# Patient Record
Sex: Female | Born: 1940 | Race: White | Hispanic: No | Marital: Married | Smoking: Never smoker
Health system: Southern US, Community
[De-identification: ages and names within clinical notes are randomized; demographics above are authoritative.]

## PROBLEM LIST (undated history)

## (undated) DIAGNOSIS — R7989 Other specified abnormal findings of blood chemistry: Secondary | ICD-10-CM

## (undated) DIAGNOSIS — I701 Atherosclerosis of renal artery: Secondary | ICD-10-CM

## (undated) DIAGNOSIS — C801 Malignant (primary) neoplasm, unspecified: Secondary | ICD-10-CM

## (undated) DIAGNOSIS — E2839 Other primary ovarian failure: Secondary | ICD-10-CM

## (undated) DIAGNOSIS — E785 Hyperlipidemia, unspecified: Secondary | ICD-10-CM

## (undated) DIAGNOSIS — I4891 Unspecified atrial fibrillation: Secondary | ICD-10-CM

## (undated) DIAGNOSIS — N3000 Acute cystitis without hematuria: Secondary | ICD-10-CM

## (undated) DIAGNOSIS — S335XXA Sprain of ligaments of lumbar spine, initial encounter: Secondary | ICD-10-CM

## (undated) DIAGNOSIS — J309 Allergic rhinitis, unspecified: Secondary | ICD-10-CM

## (undated) DIAGNOSIS — J40 Bronchitis, not specified as acute or chronic: Secondary | ICD-10-CM

## (undated) DIAGNOSIS — I5043 Acute on chronic combined systolic (congestive) and diastolic (congestive) heart failure: Secondary | ICD-10-CM

## (undated) DIAGNOSIS — K219 Gastro-esophageal reflux disease without esophagitis: Secondary | ICD-10-CM

## (undated) DIAGNOSIS — I10 Essential (primary) hypertension: Secondary | ICD-10-CM

## (undated) DIAGNOSIS — R778 Other specified abnormalities of plasma proteins: Secondary | ICD-10-CM

## (undated) DIAGNOSIS — H109 Unspecified conjunctivitis: Secondary | ICD-10-CM

## (undated) DIAGNOSIS — N390 Urinary tract infection, site not specified: Secondary | ICD-10-CM

## (undated) DIAGNOSIS — L02419 Cutaneous abscess of limb, unspecified: Secondary | ICD-10-CM

## (undated) DIAGNOSIS — IMO0002 Reserved for concepts with insufficient information to code with codable children: Secondary | ICD-10-CM

## (undated) DIAGNOSIS — B351 Tinea unguium: Secondary | ICD-10-CM

## (undated) DIAGNOSIS — L03119 Cellulitis of unspecified part of limb: Secondary | ICD-10-CM

## (undated) HISTORY — DX: Hyperlipidemia, unspecified: E78.5

## (undated) HISTORY — DX: Unspecified atrial fibrillation: I48.91

## (undated) HISTORY — PX: APPENDECTOMY: SHX54

## (undated) HISTORY — DX: Bronchitis, not specified as acute or chronic: J40

## (undated) HISTORY — DX: Allergic rhinitis, unspecified: J30.9

## (undated) HISTORY — DX: Malignant (primary) neoplasm, unspecified: C80.1

## (undated) HISTORY — PX: KNEE ARTHROSCOPY: SUR90

## (undated) HISTORY — PX: TONSILLECTOMY: SUR1361

## (undated) HISTORY — PX: DIAGNOSTIC MAMMOGRAM: HXRAD719

## (undated) HISTORY — DX: Other primary ovarian failure: E28.39

## (undated) HISTORY — DX: Other specified abnormal findings of blood chemistry: R79.89

## (undated) HISTORY — PX: CARDIAC CATHETERIZATION: SHX172

## (undated) HISTORY — DX: Sprain of ligaments of lumbar spine, initial encounter: S33.5XXA

## (undated) HISTORY — DX: Other specified abnormalities of plasma proteins: R77.8

## (undated) HISTORY — DX: Gastro-esophageal reflux disease without esophagitis: K21.9

## (undated) HISTORY — DX: Cellulitis of unspecified part of limb: L03.119

## (undated) HISTORY — DX: Atherosclerosis of renal artery: I70.1

## (undated) HISTORY — PX: EYE SURGERY: SHX253

## (undated) HISTORY — PX: COLONOSCOPY: SHX174

## (undated) HISTORY — DX: Acute cystitis without hematuria: N30.00

## (undated) HISTORY — DX: Essential (primary) hypertension: I10

## (undated) HISTORY — DX: Urinary tract infection, site not specified: N39.0

## (undated) HISTORY — DX: Cellulitis of unspecified part of limb: L02.419

## (undated) HISTORY — DX: Reserved for concepts with insufficient information to code with codable children: IMO0002

## (undated) HISTORY — DX: Unspecified conjunctivitis: H10.9

## (undated) HISTORY — DX: Tinea unguium: B35.1

## (undated) HISTORY — PX: OTHER SURGICAL HISTORY: SHX169

---

## 2001-07-02 DIAGNOSIS — H269 Unspecified cataract: Secondary | ICD-10-CM

## 2001-07-02 HISTORY — DX: Unspecified cataract: H26.9

## 2004-05-18 ENCOUNTER — Ambulatory Visit: Payer: Self-pay | Admitting: General Practice

## 2004-08-03 ENCOUNTER — Other Ambulatory Visit: Payer: Self-pay

## 2004-08-11 ENCOUNTER — Ambulatory Visit: Payer: Self-pay | Admitting: General Practice

## 2004-10-25 ENCOUNTER — Ambulatory Visit: Payer: Self-pay

## 2004-10-30 ENCOUNTER — Encounter: Payer: Self-pay | Admitting: Cardiovascular Disease

## 2005-01-23 ENCOUNTER — Ambulatory Visit: Payer: Self-pay

## 2005-10-15 ENCOUNTER — Inpatient Hospital Stay: Payer: Self-pay

## 2005-10-15 ENCOUNTER — Other Ambulatory Visit: Payer: Self-pay

## 2005-10-16 ENCOUNTER — Encounter: Payer: Self-pay | Admitting: Cardiovascular Disease

## 2006-02-13 ENCOUNTER — Ambulatory Visit: Payer: Self-pay | Admitting: Family Medicine

## 2006-11-14 ENCOUNTER — Ambulatory Visit: Payer: Self-pay | Admitting: General Surgery

## 2006-11-28 ENCOUNTER — Ambulatory Visit: Payer: Self-pay | Admitting: General Surgery

## 2006-11-28 LAB — HM COLONOSCOPY: HM COLON: NORMAL

## 2006-12-10 ENCOUNTER — Ambulatory Visit: Payer: Self-pay | Admitting: Family Medicine

## 2007-01-17 DIAGNOSIS — M5417 Radiculopathy, lumbosacral region: Secondary | ICD-10-CM | POA: Insufficient documentation

## 2007-01-21 ENCOUNTER — Ambulatory Visit: Payer: Self-pay | Admitting: Pain Medicine

## 2007-01-29 ENCOUNTER — Ambulatory Visit: Payer: Self-pay | Admitting: Pain Medicine

## 2007-03-06 ENCOUNTER — Ambulatory Visit: Payer: Self-pay | Admitting: Pain Medicine

## 2007-04-11 ENCOUNTER — Ambulatory Visit: Payer: Self-pay | Admitting: Family Medicine

## 2007-04-16 ENCOUNTER — Ambulatory Visit: Payer: Self-pay | Admitting: Pain Medicine

## 2007-06-05 ENCOUNTER — Ambulatory Visit: Payer: Self-pay | Admitting: Pain Medicine

## 2007-06-09 ENCOUNTER — Ambulatory Visit: Payer: Self-pay | Admitting: Pain Medicine

## 2007-07-17 ENCOUNTER — Ambulatory Visit: Payer: Self-pay | Admitting: Pain Medicine

## 2007-08-18 ENCOUNTER — Ambulatory Visit: Payer: Self-pay | Admitting: Pain Medicine

## 2007-09-01 ENCOUNTER — Ambulatory Visit: Payer: Self-pay | Admitting: Pain Medicine

## 2007-09-18 ENCOUNTER — Ambulatory Visit: Payer: Self-pay | Admitting: Pain Medicine

## 2007-10-08 ENCOUNTER — Ambulatory Visit: Payer: Self-pay | Admitting: Pain Medicine

## 2007-11-06 ENCOUNTER — Ambulatory Visit: Payer: Self-pay | Admitting: Pain Medicine

## 2007-11-12 ENCOUNTER — Ambulatory Visit: Payer: Self-pay | Admitting: Pain Medicine

## 2007-11-27 DIAGNOSIS — J3089 Other allergic rhinitis: Secondary | ICD-10-CM | POA: Insufficient documentation

## 2007-12-30 ENCOUNTER — Ambulatory Visit: Payer: Self-pay | Admitting: Pain Medicine

## 2008-02-26 ENCOUNTER — Ambulatory Visit: Payer: Self-pay | Admitting: Pain Medicine

## 2008-03-29 ENCOUNTER — Ambulatory Visit: Payer: Self-pay | Admitting: Pain Medicine

## 2008-04-13 ENCOUNTER — Ambulatory Visit: Payer: Self-pay | Admitting: Family Medicine

## 2008-06-03 ENCOUNTER — Ambulatory Visit: Payer: Self-pay | Admitting: Pain Medicine

## 2008-08-05 ENCOUNTER — Ambulatory Visit: Payer: Self-pay | Admitting: Pain Medicine

## 2008-09-08 ENCOUNTER — Ambulatory Visit: Payer: Self-pay | Admitting: Ophthalmology

## 2008-09-20 ENCOUNTER — Ambulatory Visit: Payer: Self-pay | Admitting: Ophthalmology

## 2008-11-04 ENCOUNTER — Ambulatory Visit: Payer: Self-pay | Admitting: Pain Medicine

## 2008-11-11 ENCOUNTER — Ambulatory Visit: Payer: Self-pay | Admitting: Ophthalmology

## 2008-11-22 ENCOUNTER — Ambulatory Visit: Payer: Self-pay | Admitting: Ophthalmology

## 2009-01-19 ENCOUNTER — Ambulatory Visit: Payer: Self-pay | Admitting: Pain Medicine

## 2009-02-16 ENCOUNTER — Ambulatory Visit: Payer: Self-pay | Admitting: Pain Medicine

## 2009-02-21 ENCOUNTER — Ambulatory Visit: Payer: Self-pay | Admitting: Pain Medicine

## 2009-03-10 ENCOUNTER — Ambulatory Visit: Payer: Self-pay | Admitting: Pain Medicine

## 2009-03-14 ENCOUNTER — Ambulatory Visit: Payer: Self-pay | Admitting: Pain Medicine

## 2009-04-15 ENCOUNTER — Ambulatory Visit: Payer: Self-pay | Admitting: Family Medicine

## 2009-09-30 ENCOUNTER — Encounter: Payer: Self-pay | Admitting: Cardiovascular Disease

## 2009-10-11 ENCOUNTER — Encounter: Payer: Self-pay | Admitting: Cardiovascular Disease

## 2009-11-07 ENCOUNTER — Ambulatory Visit: Payer: Self-pay | Admitting: Cardiovascular Disease

## 2009-11-07 DIAGNOSIS — I701 Atherosclerosis of renal artery: Secondary | ICD-10-CM | POA: Insufficient documentation

## 2009-11-07 DIAGNOSIS — I10 Essential (primary) hypertension: Secondary | ICD-10-CM | POA: Insufficient documentation

## 2009-11-07 DIAGNOSIS — E1169 Type 2 diabetes mellitus with other specified complication: Secondary | ICD-10-CM | POA: Insufficient documentation

## 2009-11-07 DIAGNOSIS — E785 Hyperlipidemia, unspecified: Secondary | ICD-10-CM | POA: Insufficient documentation

## 2009-12-13 ENCOUNTER — Encounter: Payer: Self-pay | Admitting: Cardiovascular Disease

## 2010-04-24 ENCOUNTER — Encounter: Payer: Self-pay | Admitting: Cardiovascular Disease

## 2010-08-01 NOTE — Letter (Signed)
Summary: Medical Record Release  Medical Record Release   Imported By: Harlon Flor 11/07/2009 14:48:12  _____________________________________________________________________  External Attachment:    Type:   Image     Comment:   External Document

## 2010-08-01 NOTE — Cardiovascular Report (Signed)
Summary: Cardiac Cath Other  Cardiac Cath Other   Imported By: Frazier Butt Chriscoe 12/13/2009 10:46:04  _____________________________________________________________________  External Attachment:    Type:   Image     Comment:   External Document

## 2010-08-01 NOTE — Assessment & Plan Note (Signed)
Summary: NEW PT   Primary Provider:  Irven Easterly, MD  CC:  Consult;No complaints.  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  Overall she states that she has been doing well. She has been trying to lose weight and states that she has dropped 30-40 pounds over the last several years. She try to watch her diet. Her hemoglobin A1c is typically less than 7. She is uncertain what her cholesterol is. no significant chest pain, lightheadedness. She does have some diarrhea which she attributes to her diabetes medications.  She also has a history of sciatica and a meniscus tear on the left.  She had a echocardiogram in May 2006 which was essentially normal with ejection fraction 60%.   Stress test in 06, 03 and 01 had been normal.  Current Medications (verified): 1)  Lantus 100 Unit/ml Soln (Insulin Glargine) .... As Directed 2)  Fortamet 500 Mg Xr24h-Tab (Metformin Hcl) .... Take 2 By Mouth Two Times A Day 3)  Victoza 18 Mg/83ml Soln (Liraglutide) .... As Directed 4)  Omeprazole 20 Mg Cpdr (Omeprazole) .... Take 1 By Mouth in The Evening 5)  Avalide 150-12.5 Mg Tabs (Irbesartan-Hydrochlorothiazide) .... Take 1 By Mouth Once Daily 6)  Evista 60 Mg Tabs (Raloxifene Hcl) .... Take 1 By Mouth Once Daily 7)  Crestor 10 Mg Tabs (Rosuvastatin Calcium) .... Take One Tablet By Mouth Daily. 8)  Aspirin 81 Mg Tbec (Aspirin) .... Take One Tablet By Mouth Daily 9)  Daily Multiple Vitamins  Tabs (Multiple Vitamin) .... Once Daily 10)  Lamisil 250 Mg Tabs (Terbinafine Hcl) .... Take 1 By Mouth For 3 Months  Allergies (verified): No Known Drug Allergies  Review of Systems  The patient denies fever, weight loss, weight gain, vision loss, decreased  hearing, hoarseness, chest pain, syncope, dyspnea on exertion, peripheral edema, prolonged cough, abdominal pain, incontinence, muscle weakness, depression, and enlarged lymph nodes.    Vital Signs:  Patient profile:   70 year old female Height:      64 inches Weight:      191 pounds BMI:     32.90 Pulse rate:   68 / minute BP sitting:   130 / 78  (right arm) Cuff size:   large  Vitals Entered By: Bonnie Rowland, EMT-P (Nov 07, 2009 2:45 PM)  Physical Exam  General:  well-appearing woman in no apparent distress, HEENT exam is benign, oropharynx is clear, neck is supple with no JVP or carotid bruits, heart sounds are regular with S1-S2 and no murmurs appreciated, lungs are clear to auscultation with no wheezes rales, abdominal exam is benign, no significant lower extremity edema, neurologic exam is nonfocal skin is warm and dry. Pulses are equal and symmetrical in her upper and lower extremities.    EKG  Procedure date:  11/07/2009  Findings:      normal sinus rhythm with rate of 68 beats per minute, rare PVC, no significant ST or T wave change.  Impression & Recommendations:  Problem # 1:  RENAL ATHEROSCLEROSIS (ICD-440.1) history of 50% bilateral renal artery stenosis in 2007 by cardiac catheter. We have suggested that she have a repeat ultrasound of her renal arteries at her convenience as it has been for years.  She is on aspirin and a statin.  Problem # 2:  HYPERLIPIDEMIA-MIXED (ICD-272.4) we'll try to obtain her most recent cholesterol panel from her primary care physician  Her updated medication list for this problem includes:    Crestor 10 Mg Tabs (Rosuvastatin calcium) .Marland Kitchen... Take one tablet by mouth daily.  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 She states her hemoglobin A1c is typically less than 7, close to 6.5. She is losing weight and has dropped more than 20 pounds over the past several  years.  Her updated medication list for this problem includes:    Lantus 100 Unit/ml Soln (Insulin glargine) .Marland Kitchen... As directed    Fortamet 500 Mg Xr24h-tab (Metformin hcl) .Marland Kitchen... Take 2 by mouth two times a day    Victoza 18 Mg/29ml Soln (Liraglutide) .Marland Kitchen... As directed    Avalide 150-12.5 Mg Tabs (Irbesartan-hydrochlorothiazide) .Marland Kitchen... Take 1 by mouth once daily    Aspirin 81 Mg Tbec (Aspirin) .Marland Kitchen... Take one tablet by mouth daily  Problem # 4:  HYPERTENSION, BENIGN (ICD-401.1) Blood pressure is well controlled on today's visit. She states she has been given a new prescription for losartan HCT 100/12.5 mg daily. Have asked her to monitor her blood pressure on any medication. Her updated medication list for this problem includes:    Avalide 150-12.5 Mg Tabs (Irbesartan-hydrochlorothiazide) .Marland Kitchen... Take 1 by mouth once daily    Aspirin 81 Mg Tbec (Aspirin) .Marland Kitchen... Take one tablet by mouth daily  Patient Instructions: 1)  Your physician recommends that you schedule a follow-up appointment in: 1 year.  Appended Document: NEW PT Labs from 4/11 Chol 117 LDL 39 HDL 69 TG 44  Labs are very very good with recent weight loss. Would cut crestor 10 mg in 1/2 (take 5 mg)  Appended Document: NEW PT Attempted to notify pt of results.  LMOM TCB.  EWJ  Appended Document: NEW PT Called spoke with pt advised of results.  EWJ   Clinical Lists Changes  Medications: Changed medication from CRESTOR 10 MG TABS (ROSUVASTATIN CALCIUM) Take one tablet by mouth daily. to CRESTOR 10 MG TABS (ROSUVASTATIN CALCIUM) Take 1/2 tablet by mouth daily.

## 2010-08-01 NOTE — Letter (Signed)
Summary: PHI  PHI   Imported By: Harlon Flor 11/09/2009 14:22:40  _____________________________________________________________________  External Attachment:    Type:   Image     Comment:   External Document

## 2010-08-24 ENCOUNTER — Ambulatory Visit: Payer: Self-pay | Admitting: Family Medicine

## 2010-09-29 ENCOUNTER — Encounter: Payer: Self-pay | Admitting: Cardiovascular Disease

## 2010-11-06 ENCOUNTER — Encounter: Payer: Self-pay | Admitting: Cardiovascular Disease

## 2010-11-06 ENCOUNTER — Ambulatory Visit (INDEPENDENT_AMBULATORY_CARE_PROVIDER_SITE_OTHER): Payer: BC Managed Care – PPO | Admitting: Cardiovascular Disease

## 2010-11-06 DIAGNOSIS — E785 Hyperlipidemia, unspecified: Secondary | ICD-10-CM

## 2010-11-06 DIAGNOSIS — I10 Essential (primary) hypertension: Secondary | ICD-10-CM

## 2010-11-06 DIAGNOSIS — I701 Atherosclerosis of renal artery: Secondary | ICD-10-CM

## 2010-11-06 DIAGNOSIS — R079 Chest pain, unspecified: Secondary | ICD-10-CM | POA: Insufficient documentation

## 2010-11-06 DIAGNOSIS — E119 Type 2 diabetes mellitus without complications: Secondary | ICD-10-CM

## 2010-11-06 DIAGNOSIS — I25810 Atherosclerosis of coronary artery bypass graft(s) without angina pectoris: Secondary | ICD-10-CM

## 2010-11-06 NOTE — Assessment & Plan Note (Signed)
Chest pain is atypical in that it comes on at rest. She is able to exert herself and exercise without significant symptoms. We have suggested she monitor her symptoms for now with no cardiac workup

## 2010-11-06 NOTE — Progress Notes (Signed)
   Patient ID: Bonnie Rowland, female    DOB: 1941-06-14, 70 y.o.   MRN: [id number]  HPI Comments: Ms. 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  She reports that she has had some episodes of chest pain recently. They come on at rest, seemed to come after she eats. They come and go as they please. They're not associated with exertion or exercise. She has been doing some upper body weights and leg weights. Her other exercises do not cause any significant chest discomfort. She is taking a proton pump inhibitor every other day. She tried Actos but did not like the way it made her feel.   Her weight is up because she has been eating "shock" She also has a history of sciatica and a meniscus tear on the left. She had a echocardiogram in May 2006 which was essentially normal with ejection fraction 60%.     Stress test in 06, 03 and 01 had been normal.  EKG done by Dr. Charlette Caffey in his clinic shows normal sinus rhythm with nonspecific ST abnormality in the anterolateral leads as well as leads 2, aVF. This was seen previously in EKG from last year      Review of Systems  Constitutional: Negative.   HENT: Negative.   Eyes: Negative.   Respiratory: Negative.   Cardiovascular: Positive for chest pain.  Gastrointestinal: Negative.   Musculoskeletal: Negative.   Skin: Negative.   Neurological: Negative.   Hematological: Negative.   Psychiatric/Behavioral: Negative.   All other systems reviewed and are negative.    BP 138/74  Pulse 64  Ht 5\' 4"  (1.626 m)  Wt 197 lb 6.4 oz (89.54 kg)  BMI 33.88 kg/m2  Physical Exam  Nursing note and vitals reviewed. Constitutional: She is oriented to person, place, and time. She appears well-developed and well-nourished.  HENT:    Head: Normocephalic.  Nose: Nose normal.  Mouth/Throat: Oropharynx is clear and moist.  Eyes: Conjunctivae are normal. Pupils are equal, round, and reactive to light.  Neck: Normal range of motion. Neck supple. No JVD present.  Cardiovascular: Normal rate, regular rhythm, S1 normal, S2 normal, normal heart sounds and intact distal pulses.  Exam reveals no gallop and no friction rub.   No murmur heard. Pulmonary/Chest: Effort normal and breath sounds normal. No respiratory distress. She has no wheezes. She has no rales. She exhibits no tenderness.  Abdominal: Soft. Bowel sounds are normal. She exhibits no distension. There is no tenderness.  Musculoskeletal: Normal range of motion. She exhibits no edema and no tenderness.  Lymphadenopathy:    She has no cervical adenopathy.  Neurological: She is alert and oriented to person, place, and time. Coordination normal.  Skin: Skin is warm and dry. No rash noted. No erythema.  Psychiatric: She has a normal mood and affect. Her behavior is normal. Judgment and thought content normal.         Assessment and Plan

## 2010-11-06 NOTE — Assessment & Plan Note (Signed)
In the past, she has had excellent cholesterol numbers. She is taking Crestor every other day. We will wait for her new numbers later this month.

## 2010-11-06 NOTE — Patient Instructions (Signed)
You are doing well. No medication changes were made. Please call us if you have new issues that need to be addressed before your next appt.  We will call you for a follow up Appt. In 12 months  

## 2010-11-06 NOTE — Assessment & Plan Note (Signed)
Continue aggressive cholesterol management for her underlying renal disease.

## 2010-11-06 NOTE — Assessment & Plan Note (Signed)
We have encouraged continued exercise, careful diet management in an effort to lose weight. 

## 2010-11-06 NOTE — Assessment & Plan Note (Signed)
Blood pressure is well controlled on today's visit. No changes made to the medications. 

## 2011-05-14 ENCOUNTER — Encounter: Payer: Self-pay | Admitting: Cardiovascular Disease

## 2011-05-16 ENCOUNTER — Encounter: Payer: Self-pay | Admitting: Cardiovascular Disease

## 2011-05-16 ENCOUNTER — Ambulatory Visit (INDEPENDENT_AMBULATORY_CARE_PROVIDER_SITE_OTHER): Payer: BC Managed Care – PPO | Admitting: Cardiovascular Disease

## 2011-05-16 VITALS — BP 110/80 | HR 62 | Resp 16 | Ht 64.0 in | Wt 200.0 lb

## 2011-05-16 DIAGNOSIS — I10 Essential (primary) hypertension: Secondary | ICD-10-CM

## 2011-05-16 DIAGNOSIS — E785 Hyperlipidemia, unspecified: Secondary | ICD-10-CM

## 2011-05-16 DIAGNOSIS — I701 Atherosclerosis of renal artery: Secondary | ICD-10-CM

## 2011-05-16 DIAGNOSIS — E119 Type 2 diabetes mellitus without complications: Secondary | ICD-10-CM

## 2011-05-16 NOTE — Assessment & Plan Note (Signed)
We will try to obtain her latest renal ultrasound for our records. Cholesterol management is excellent, blood pressure control is very good as well suggesting no significant progression of her renal artery stenosis.

## 2011-05-16 NOTE — Assessment & Plan Note (Signed)
We have encouraged continued exercise, careful diet management in an effort to lose weight. 

## 2011-05-16 NOTE — Assessment & Plan Note (Signed)
Blood pressure is well controlled, in fact it is borderline low. No dizziness. We have suggested that she could try to cut her losartan and half and closely monitor her blood pressure. As a diabetic, we try to keep her systolic pressure less than 135, preferably less than 130.

## 2011-05-16 NOTE — Assessment & Plan Note (Signed)
Cholesterol is at goal on the current lipid regimen. No changes to the medications were made.  

## 2011-05-16 NOTE — Progress Notes (Signed)
Patient ID: Bonnie Rowland, female    DOB: 04-02-1941, 70 y.o.   MRN: [id number]  HPI Comments: Bonnie Rowland is a very pleasant 70 year old woman with a history of normal coronary arteries by cardiac catheterization April 2007 which was performed for chest pain, 50% bilateral renal artery disease, hyperlipidemia, diabetes, hypertension who presents for routine followup.  She has had previous episodes of chest pain though reports having no symptoms at this time. She exercises 3 times per week at the gym using the bike, and weights. Her on weight has increased and she is trying to watch what she eats. She reports that her cholesterol is good and her hemoglobin A1c was 6.1.   She also has a history of sciatica and a meniscus tear on the left. She had a echocardiogram in May 2006 which was essentially normal with ejection fraction 60%.     Stress test in 06, 03 and 01 had been normal.  EKG shows normal sinus rhythm with rate 62 beats a minute with nonspecific ST abnormality in leads V3 through V6    Outpatient Encounter Prescriptions as of 05/16/2011  Medication Sig Dispense Refill  . aspirin 81 MG EC tablet Take 81 mg by mouth daily.        . B Complex-C (B-COMPLEX WITH VITAMIN C) tablet Take 1 tablet by mouth daily.        . calcium-vitamin D (OSCAL WITH D) 500-200 MG-UNIT per tablet Take 1 tablet by mouth daily.        . Coenzyme Q10 (COQ-10) 100 MG CAPS Take 1 capsule by mouth at bedtime.        . Cranberry 1000 MG CAPS Take 1 capsule by mouth every other day.        . fish oil-omega-3 fatty acids 1000 MG capsule Take 2 g by mouth daily.        Marland Kitchen GARLIC 1500 PO Take 1 tablet by mouth daily.        . Glucosamine 500 MG TABS Take 1 tablet by mouth daily.        . insulin glargine (LANTUS) 100 UNIT/ML injection Inject into the skin as directed.        . Liraglutide (VICTOZA) 18 MG/3ML SOLN Inject into the skin as directed.        Marland Kitchen losartan (COZAAR) 100 MG tablet Take 100 mg by mouth  daily.        . metformin (FORTAMET) 500 MG (OSM) 24 hr tablet Take 1,000 mg by mouth 2 (two) times daily with a meal.       . Multiple Vitamin (MULTIVITAMIN) tablet Take 1 tablet by mouth daily.        Marland Kitchen omeprazole (PRILOSEC) 20 MG capsule Take 20 mg by mouth every other day.       . raloxifene (EVISTA) 60 MG tablet Take 60 mg by mouth daily.        . rosuvastatin (CRESTOR) 10 MG tablet Take 10 mg by mouth every other day.       . terbinafine (LAMISIL) 250 MG tablet Take 250 mg by mouth daily.           Review of Systems  Constitutional: Negative.   HENT: Negative.   Eyes: Negative.   Respiratory: Negative.   Gastrointestinal: Negative.   Musculoskeletal: Negative.   Skin: Negative.   Neurological: Negative.   Hematological: Negative.   Psychiatric/Behavioral: Negative.   All other systems reviewed and are negative.    BP 110/80  Pulse 62  Resp 16  Ht 5\' 4"  (1.626 m)  Wt 200 lb (90.719 kg)  BMI 34.33 kg/m2  Physical Exam  Nursing note and vitals reviewed. Constitutional: She is oriented to person, place, and time. She appears well-developed and well-nourished.       obesity  HENT:  Head: Normocephalic.  Nose: Nose normal.  Mouth/Throat: Oropharynx is clear and moist.  Eyes: Conjunctivae are normal. Pupils are equal, round, and reactive to light.  Neck: Normal range of motion. Neck supple. No JVD present.  Cardiovascular: Normal rate, regular rhythm, S1 normal, S2 normal, normal heart sounds and intact distal pulses.  Exam reveals no gallop and no friction rub.   No murmur heard. Pulmonary/Chest: Effort normal and breath sounds normal. No respiratory distress. She has no wheezes. She has no rales. She exhibits no tenderness.  Abdominal: Soft. Bowel sounds are normal. She exhibits no distension. There is no tenderness.  Musculoskeletal: Normal range of motion. She exhibits no edema and no tenderness.  Lymphadenopathy:    She has no cervical adenopathy.  Neurological:  She is alert and oriented to person, place, and time. Coordination normal.  Skin: Skin is warm and dry. No rash noted. No erythema.  Psychiatric: She has a normal mood and affect. Her behavior is normal. Judgment and thought content normal.         Assessment and Plan

## 2011-05-16 NOTE — Patient Instructions (Signed)
You are doing well. No medication changes were made. If you would like, cut the losartan in 1/2 and monitor your blood pressure  Please call us if you have new issues that need to be addressed before your next appt.  The office will contact you for a follow up Appt. In 12 months

## 2011-06-14 ENCOUNTER — Other Ambulatory Visit: Payer: Self-pay

## 2011-06-14 DIAGNOSIS — I701 Atherosclerosis of renal artery: Secondary | ICD-10-CM

## 2011-06-29 ENCOUNTER — Encounter (INDEPENDENT_AMBULATORY_CARE_PROVIDER_SITE_OTHER): Payer: BC Managed Care – PPO | Admitting: *Deleted

## 2011-06-29 DIAGNOSIS — I701 Atherosclerosis of renal artery: Secondary | ICD-10-CM

## 2011-07-10 NOTE — Progress Notes (Signed)
LMOM to call back regarding results.

## 2011-07-18 ENCOUNTER — Telehealth: Payer: Self-pay

## 2011-07-20 NOTE — Telephone Encounter (Signed)
Patient notified of test results 

## 2011-09-25 ENCOUNTER — Ambulatory Visit: Payer: Self-pay | Admitting: Family Medicine

## 2011-12-18 LAB — HM COLONOSCOPY: HM Colonoscopy: NORMAL

## 2012-05-08 ENCOUNTER — Encounter: Payer: Self-pay | Admitting: *Deleted

## 2012-05-15 ENCOUNTER — Ambulatory Visit (INDEPENDENT_AMBULATORY_CARE_PROVIDER_SITE_OTHER): Payer: BC Managed Care – PPO | Admitting: Cardiovascular Disease

## 2012-05-15 ENCOUNTER — Encounter: Payer: Self-pay | Admitting: Cardiovascular Disease

## 2012-05-15 VITALS — BP 138/68 | HR 68 | Ht 64.0 in | Wt 194.5 lb

## 2012-05-15 DIAGNOSIS — I701 Atherosclerosis of renal artery: Secondary | ICD-10-CM

## 2012-05-15 DIAGNOSIS — E119 Type 2 diabetes mellitus without complications: Secondary | ICD-10-CM

## 2012-05-15 DIAGNOSIS — I10 Essential (primary) hypertension: Secondary | ICD-10-CM

## 2012-05-15 DIAGNOSIS — E785 Hyperlipidemia, unspecified: Secondary | ICD-10-CM

## 2012-05-15 NOTE — Assessment & Plan Note (Signed)
Cholesterol is at goal on the current lipid regimen. No changes to the medications were made.  

## 2012-05-15 NOTE — Assessment & Plan Note (Signed)
Continue aggressive lipid management, goal LDL less than 70 

## 2012-05-15 NOTE — Patient Instructions (Addendum)
You are doing well. No medication changes were made.  Please call us if you have new issues that need to be addressed before your next appt.  Your physician wants you to follow-up in: 12 months.  You will receive a reminder letter in the mail two months in advance. If you don't receive a letter, please call our office to schedule the follow-up appointment. 

## 2012-05-15 NOTE — Assessment & Plan Note (Signed)
Blood pressure is well controlled on today's visit. No changes made to the medications. 

## 2012-05-15 NOTE — Progress Notes (Signed)
Patient ID: Bonnie Rowland, female    DOB: 1940-08-16, 71 y.o.   MRN: [id number]  HPI Comments: Bonnie Rowland is a very pleasant 71 year old woman with a history of normal coronary arteries by cardiac catheterization April 2007 which was performed for chest pain, < 50%  renal artery disease,  hyperlipidemia, diabetes, hypertension, obesity who presents for routine followup.  She had a difficult year as her father passed away several months ago. She was eating poorly and diabetes numbers were elevated. Weight also increased. She has now restarted her  exercises 3 times per week at the gym using the bike, and weights.  she is trying to watch what she eats.  Total cholesterol 120, LDL in the 50s Hemoglobin W2N 6.9  She also has a history of sciatica and a meniscus tear on the left. She had a echocardiogram in May 2006 which was essentially normal with ejection fraction 60%.     Stress test in 06, 03 and 01 had been normal.  EKG shows normal sinus rhythm with nonspecific ST abnormality in leads V3 through V6    Outpatient Encounter Prescriptions as of 05/15/2012  Medication Sig Dispense Refill  . aspirin 81 MG EC tablet Take 81 mg by mouth daily.        . B Complex-C (B-COMPLEX WITH VITAMIN C) tablet Take 1 tablet by mouth daily.        . cholecalciferol (VITAMIN D) 1000 UNITS tablet Take 1,000 Units by mouth 2 (two) times daily.      . Coenzyme Q10 (COQ-10) 100 MG CAPS Take 1 capsule by mouth at bedtime.        . Cranberry 1000 MG CAPS Take 1 capsule by mouth every other day.        . fish oil-omega-3 fatty acids 1000 MG capsule Take 2 g by mouth daily.        Marland Kitchen GARLIC 1500 PO Take 1 tablet by mouth daily.        . Glucosamine 500 MG TABS Take 1 tablet by mouth daily.        . insulin glargine (LANTUS) 100 UNIT/ML injection Inject 24 Units into the skin at bedtime.       . Liraglutide (VICTOZA) 18 MG/3ML SOLN Inject into the skin as directed.        Marland Kitchen losartan (COZAAR) 100 MG tablet Take  100 mg by mouth daily.        . metformin (FORTAMET) 500 MG (OSM) 24 hr tablet Take 500 mg by mouth 2 (two) times daily with a meal.       . Multiple Vitamin (MULTIVITAMIN) tablet Take 1 tablet by mouth daily.       Marland Kitchen omeprazole (PRILOSEC) 20 MG capsule Take 20 mg by mouth every other day.       . raloxifene (EVISTA) 60 MG tablet Take 60 mg by mouth daily.        . rosuvastatin (CRESTOR) 10 MG tablet Take 10 mg by mouth every other day.       . terbinafine (LAMISIL) 250 MG tablet Take 250 mg by mouth daily.        . [DISCONTINUED] calcium-vitamin D (OSCAL WITH D) 500-200 MG-UNIT per tablet Take 1 tablet by mouth daily.           Review of Systems  Constitutional: Negative.   HENT: Negative.   Eyes: Negative.   Respiratory: Negative.   Gastrointestinal: Negative.   Musculoskeletal: Negative.   Skin: Negative.  Neurological: Negative.   Hematological: Negative.   Psychiatric/Behavioral: Negative.   All other systems reviewed and are negative.    BP 138/68  Pulse 68  Ht 5\' 4"  (1.626 m)  Wt 194 lb 8 oz (88.225 kg)  BMI 33.39 kg/m2  Physical Exam  Nursing note and vitals reviewed. Constitutional: She is oriented to person, place, and time. She appears well-developed and well-nourished.       obesity  HENT:  Head: Normocephalic.  Nose: Nose normal.  Mouth/Throat: Oropharynx is clear and moist.  Eyes: Conjunctivae normal are normal. Pupils are equal, round, and reactive to light.  Neck: Normal range of motion. Neck supple. No JVD present.  Cardiovascular: Normal rate, regular rhythm, S1 normal, S2 normal, normal heart sounds and intact distal pulses.  Exam reveals no gallop and no friction rub.   No murmur heard. Pulmonary/Chest: Effort normal and breath sounds normal. No respiratory distress. She has no wheezes. She has no rales. She exhibits no tenderness.  Abdominal: Soft. Bowel sounds are normal. She exhibits no distension. There is no tenderness.  Musculoskeletal: Normal  range of motion. She exhibits no edema and no tenderness.  Lymphadenopathy:    She has no cervical adenopathy.  Neurological: She is alert and oriented to person, place, and time. Coordination normal.  Skin: Skin is warm and dry. No rash noted. No erythema.  Psychiatric: She has a normal mood and affect. Her behavior is normal. Judgment and thought content normal.         Assessment and Plan

## 2012-10-31 ENCOUNTER — Ambulatory Visit: Payer: Self-pay | Admitting: Family Medicine

## 2013-04-21 ENCOUNTER — Ambulatory Visit: Payer: Self-pay | Admitting: Pain Medicine

## 2013-04-29 ENCOUNTER — Ambulatory Visit: Payer: Self-pay | Admitting: Pain Medicine

## 2013-05-01 ENCOUNTER — Encounter: Payer: Self-pay | Admitting: Podiatry

## 2013-05-01 ENCOUNTER — Ambulatory Visit (INDEPENDENT_AMBULATORY_CARE_PROVIDER_SITE_OTHER): Payer: BC Managed Care – PPO

## 2013-05-01 ENCOUNTER — Ambulatory Visit (INDEPENDENT_AMBULATORY_CARE_PROVIDER_SITE_OTHER): Payer: BC Managed Care – PPO | Admitting: Podiatry

## 2013-05-01 VITALS — BP 153/75 | HR 65 | Resp 18 | Ht 64.0 in | Wt 198.0 lb

## 2013-05-01 DIAGNOSIS — M216X9 Other acquired deformities of unspecified foot: Secondary | ICD-10-CM

## 2013-05-01 DIAGNOSIS — R52 Pain, unspecified: Secondary | ICD-10-CM

## 2013-05-01 DIAGNOSIS — M779 Enthesopathy, unspecified: Secondary | ICD-10-CM

## 2013-05-01 NOTE — Progress Notes (Signed)
Subjective:     Patient ID: Bonnie Rowland, female   DOB: [birth year], 72 y.o.   MRN: [id number]  HPI patient presents stating I'm having pain on the bottom of my left foot states been hurting forabout a month and is worse when she is on her foot alot   Review of Systems     Objective:   Physical Exam  Nursing note and vitals reviewed. Constitutional: She is oriented to person, place, and time. She appears well-nourished.  Cardiovascular: Intact distal pulses.   Neurological: She is oriented to person, place, and time.   Patient is found to have discomfort in the third metatarsal head left foot with no skin changes or damage underlying on the plantar surface    Assessment:     Probable plantarflexed metatarsal with inflammation surrounding    Plan:     Educated patient and reviewed x-rays. Dispense a thick pad to remove pressure against the joint surface

## 2013-05-11 ENCOUNTER — Ambulatory Visit: Payer: Self-pay | Admitting: Pain Medicine

## 2013-05-15 ENCOUNTER — Encounter: Payer: Self-pay | Admitting: Cardiovascular Disease

## 2013-05-15 ENCOUNTER — Ambulatory Visit (INDEPENDENT_AMBULATORY_CARE_PROVIDER_SITE_OTHER): Payer: BC Managed Care – PPO | Admitting: Cardiovascular Disease

## 2013-05-15 VITALS — BP 130/62 | HR 71 | Ht 64.0 in | Wt 196.0 lb

## 2013-05-15 DIAGNOSIS — I10 Essential (primary) hypertension: Secondary | ICD-10-CM

## 2013-05-15 DIAGNOSIS — E785 Hyperlipidemia, unspecified: Secondary | ICD-10-CM

## 2013-05-15 DIAGNOSIS — I701 Atherosclerosis of renal artery: Secondary | ICD-10-CM

## 2013-05-15 DIAGNOSIS — E119 Type 2 diabetes mellitus without complications: Secondary | ICD-10-CM

## 2013-05-15 NOTE — Assessment & Plan Note (Signed)
Remote increase in the velocity on the left renal artery in the past. Normal creatinine. Cholesterol well controlled.

## 2013-05-15 NOTE — Assessment & Plan Note (Signed)
We have encouraged continued exercise, careful diet management in an effort to lose weight. 

## 2013-05-15 NOTE — Progress Notes (Signed)
Patient ID: Bonnie Rowland, female    DOB: June 25, 1941, 72 y.o.   MRN: [id number]  HPI Comments: Bonnie Rowland is a very pleasant 72 year old woman with a history of chest pain, normal coronary arteries by cardiac catheterization April 2007 , < 50%  renal artery disease,  hyperlipidemia, diabetes, hypertension, obesity who presents for routine followup.  In general she reports that she is doing well. She has significant osteoarthritis of her knees. Hemoglobin A1c 7.6 . Weight continues to be a problem  She's not exercising as she did in the past . Overall she is in good spirits Total cholesterol 120, LDL in the 50s  She also has a history of sciatica and a meniscus tear on the left. She had a echocardiogram in May 2006 which was essentially normal with ejection fraction 60%.     Stress test in 06, 03 and 01 had been normal.  EKG shows normal sinus rhythm with rate 71 beats per minute with nonspecific ST abnormality in leads V3 through V6    Outpatient Encounter Prescriptions as of 05/15/2013  Medication Sig  . aspirin 81 MG EC tablet Take 81 mg by mouth daily.    . B Complex-C (B-COMPLEX WITH VITAMIN C) tablet Take 1 tablet by mouth daily.    . cholecalciferol (VITAMIN D) 1000 UNITS tablet Take 4,000 Units by mouth daily.   . Coenzyme Q10 (COQ-10) 100 MG CAPS Take 1 capsule by mouth at bedtime.   . Cranberry 1000 MG CAPS Take 1 capsule by mouth every other day.    . fish oil-omega-3 fatty acids 1000 MG capsule Take 2 g by mouth daily.    Marland Kitchen GARLIC 1500 PO Take 1 tablet by mouth daily.    . Glucosamine 500 MG TABS Take 1 tablet by mouth daily.    . insulin glargine (LANTUS) 100 UNIT/ML injection Inject 24 Units into the skin at bedtime.   . Liraglutide (VICTOZA) 18 MG/3ML SOLN Inject into the skin as directed.    Marland Kitchen losartan (COZAAR) 100 MG tablet Take 100 mg by mouth daily.    . metformin (FORTAMET) 500 MG (OSM) 24 hr tablet Take 500 mg by mouth 2 (two) times daily with a meal.   .  Multiple Vitamin (MULTIVITAMIN) tablet Take 1 tablet by mouth daily.   Marland Kitchen omeprazole (PRILOSEC) 20 MG capsule Take 20 mg by mouth every other day.   . raloxifene (EVISTA) 60 MG tablet Take 60 mg by mouth daily.    . rosuvastatin (CRESTOR) 10 MG tablet Take 10 mg by mouth every other day.   . [DISCONTINUED] terbinafine (LAMISIL) 250 MG tablet Take 250 mg by mouth daily.       Review of Systems  Constitutional: Negative.   HENT: Negative.   Eyes: Negative.   Respiratory: Negative.   Cardiovascular: Negative.   Gastrointestinal: Negative.   Endocrine: Negative.   Musculoskeletal: Positive for arthralgias.  Skin: Negative.   Allergic/Immunologic: Negative.   Neurological: Negative.   Hematological: Negative.   Psychiatric/Behavioral: Negative.   All other systems reviewed and are negative.    BP 130/62  Pulse 71  Ht 5\' 4"  (1.626 m)  Wt 196 lb (88.905 kg)  BMI 33.63 kg/m2  Physical Exam  Nursing note and vitals reviewed. Constitutional: She is oriented to person, place, and time. She appears well-developed and well-nourished.  obesity  HENT:  Head: Normocephalic.  Nose: Nose normal.  Mouth/Throat: Oropharynx is clear and moist.  Eyes: Conjunctivae are normal. Pupils are equal, round, and  reactive to light.  Neck: Normal range of motion. Neck supple. No JVD present.  Cardiovascular: Normal rate, regular rhythm, S1 normal, S2 normal, normal heart sounds and intact distal pulses.  Exam reveals no gallop and no friction rub.   No murmur heard. Pulmonary/Chest: Effort normal and breath sounds normal. No respiratory distress. She has no wheezes. She has no rales. She exhibits no tenderness.  Abdominal: Soft. Bowel sounds are normal. She exhibits no distension. There is no tenderness.  Musculoskeletal: Normal range of motion. She exhibits no edema and no tenderness.  Lymphadenopathy:    She has no cervical adenopathy.  Neurological: She is alert and oriented to person, place, and  time. Coordination normal.  Skin: Skin is warm and dry. No rash noted. No erythema.  Psychiatric: She has a normal mood and affect. Her behavior is normal. Judgment and thought content normal.    Assessment and Plan

## 2013-05-15 NOTE — Assessment & Plan Note (Signed)
Blood pressure is well controlled on today's visit. No changes made to the medications. 

## 2013-05-15 NOTE — Patient Instructions (Addendum)
You are doing well. No medication changes were made.  Please call us if you have new issues that need to be addressed before your next appt.  Your physician wants you to follow-up in: 12 months.  You will receive a reminder letter in the mail two months in advance. If you don't receive a letter, please call our office to schedule the follow-up appointment. 

## 2013-05-15 NOTE — Assessment & Plan Note (Signed)
Cholesterol is at goal on the current lipid regimen. No changes to the medications were made.  

## 2013-05-21 ENCOUNTER — Ambulatory Visit: Payer: Self-pay | Admitting: Pain Medicine

## 2013-06-10 ENCOUNTER — Ambulatory Visit: Payer: Self-pay | Admitting: Pain Medicine

## 2013-06-22 ENCOUNTER — Ambulatory Visit: Payer: Self-pay | Admitting: Pain Medicine

## 2013-06-26 ENCOUNTER — Ambulatory Visit (INDEPENDENT_AMBULATORY_CARE_PROVIDER_SITE_OTHER): Payer: BC Managed Care – PPO | Admitting: Podiatry

## 2013-06-26 ENCOUNTER — Encounter: Payer: Self-pay | Admitting: Podiatry

## 2013-06-26 VITALS — BP 164/75 | HR 68 | Resp 16 | Ht 64.0 in | Wt 198.0 lb

## 2013-06-26 DIAGNOSIS — Q828 Other specified congenital malformations of skin: Secondary | ICD-10-CM

## 2013-06-26 DIAGNOSIS — M79609 Pain in unspecified limb: Secondary | ICD-10-CM

## 2013-06-26 NOTE — Progress Notes (Signed)
Subjective:     Patient ID: Bonnie Rowland, female   DOB: [birth year], 72 y.o.   MRN: [id number]  HPI patient has 3 lesions plantar aspect right foot and lateral plantar aspect left foot that are painful when pressed with small waxy core  Review of Systems     Objective:   Physical Exam Neurovascular status intact with painful lesions that were evaluated    Assessment:     Porokeratosis plantar aspect both feet    Plan:     Debrided the lesions and applied Band-Aids with no iatrogenic bleeding noted reappoint as needed

## 2013-06-26 NOTE — Progress Notes (Signed)
   Subjective:    Patient ID: Bonnie Rowland, female    DOB: 12-11-1940, 72 y.o.   MRN: [id number]  HPI    Review of Systems  Constitutional: Negative.   HENT: Positive for hearing loss.   Eyes: Negative.   Respiratory: Negative.   Cardiovascular: Negative.   Gastrointestinal: Negative.   Endocrine: Negative.   Genitourinary: Negative.   Skin: Negative.   Allergic/Immunologic: Negative.   Neurological: Negative.   Hematological: Negative.   Psychiatric/Behavioral: Negative.        Objective:   Physical Exam        Assessment & Plan:

## 2013-07-02 DIAGNOSIS — M1990 Unspecified osteoarthritis, unspecified site: Secondary | ICD-10-CM

## 2013-07-02 HISTORY — DX: Unspecified osteoarthritis, unspecified site: M19.90

## 2013-07-15 ENCOUNTER — Ambulatory Visit: Payer: Self-pay | Admitting: Family Medicine

## 2013-07-22 ENCOUNTER — Ambulatory Visit: Payer: Self-pay | Admitting: Pain Medicine

## 2013-08-14 ENCOUNTER — Encounter: Payer: Self-pay | Admitting: Podiatry

## 2013-08-14 ENCOUNTER — Ambulatory Visit (INDEPENDENT_AMBULATORY_CARE_PROVIDER_SITE_OTHER): Payer: BC Managed Care – PPO | Admitting: Podiatry

## 2013-08-14 VITALS — BP 156/75 | HR 62 | Resp 16 | Ht 63.0 in | Wt 197.0 lb

## 2013-08-14 DIAGNOSIS — Q828 Other specified congenital malformations of skin: Secondary | ICD-10-CM

## 2013-08-14 DIAGNOSIS — M779 Enthesopathy, unspecified: Secondary | ICD-10-CM

## 2013-08-14 DIAGNOSIS — M79609 Pain in unspecified limb: Secondary | ICD-10-CM

## 2013-08-14 MED ORDER — TRIAMCINOLONE ACETONIDE 10 MG/ML IJ SUSP
10.0000 mg | Freq: Once | INTRAMUSCULAR | Status: AC
  Administered 2013-08-14: 10 mg

## 2013-08-14 NOTE — Progress Notes (Signed)
Subjective:     Patient ID: Bonnie Rowland, female   DOB: February 01, 1941, 73 y.o.   MRN: [id number]  HPI patient has painful lesion lateral side of right foot which is developed inflammation and fluid underneath   Review of Systems     Objective:   Physical Exam Neurovascular status intact with keratotic lesion and inflammation and fluid underneath the right fifth metatarsal base    Assessment:     Capsulitis with keratotic lesion noted right    Plan:     Injected a small amount of dexamethasone Kenalog Xylocaine combination and debrided lesions fully and applied sterile dressing. Reappoint as needed

## 2013-08-19 ENCOUNTER — Ambulatory Visit: Payer: Self-pay | Admitting: Pain Medicine

## 2013-09-18 ENCOUNTER — Ambulatory Visit (INDEPENDENT_AMBULATORY_CARE_PROVIDER_SITE_OTHER): Payer: BC Managed Care – PPO | Admitting: Podiatry

## 2013-09-18 VITALS — BP 170/71 | HR 65 | Resp 16 | Ht 64.0 in | Wt 196.0 lb

## 2013-09-18 DIAGNOSIS — Q828 Other specified congenital malformations of skin: Secondary | ICD-10-CM

## 2013-09-18 NOTE — Progress Notes (Signed)
Subjective:     Patient ID: Bonnie Rowland, female   DOB: 01/05/1941, 73 y.o.   MRN: [id number]  HPI patient presents with plantar keratotic lesion on the right foot and left foot that are becoming painful and symptomatic. She is a diabetic and cannot take care of herself   Review of Systems     Objective:   Physical Exam Neurovascular status unchanged with keratotic lesion lateral side right foot and lateral side left foot that are lucent when debrided    Assessment:     Porokeratotic lesions both feet    Plan:     Debridement of porokeratotic lesions and applied Band-Aid to the right where there was a small amount of bleeding

## 2013-11-06 DIAGNOSIS — M1710 Unilateral primary osteoarthritis, unspecified knee: Secondary | ICD-10-CM | POA: Insufficient documentation

## 2013-11-06 DIAGNOSIS — M179 Osteoarthritis of knee, unspecified: Secondary | ICD-10-CM | POA: Insufficient documentation

## 2013-11-25 ENCOUNTER — Ambulatory Visit: Payer: Self-pay | Admitting: Pain Medicine

## 2013-12-04 ENCOUNTER — Ambulatory Visit (INDEPENDENT_AMBULATORY_CARE_PROVIDER_SITE_OTHER): Payer: BC Managed Care – PPO | Admitting: Podiatry

## 2013-12-04 VITALS — BP 161/76 | HR 80 | Resp 16

## 2013-12-04 DIAGNOSIS — Q828 Other specified congenital malformations of skin: Secondary | ICD-10-CM

## 2013-12-04 NOTE — Progress Notes (Signed)
Subjective:     Patient ID: Bonnie Rowland, female   DOB: 12/15/1940, 73 y.o.   MRN: [id number]  HPI patient presents with lesions on the bottom of both feet that become very painful but she can not get rid of   Review of Systems     Objective:   Physical Exam Neurovascular status intact with 2 small nucleated lesions plantar right and one on the left    Assessment:     Porokeratosis type lesions x3    Plan:     Debridement of lesions with no bleeding and reappoint as needed

## 2013-12-17 LAB — HM MAMMOGRAPHY: HM Mammogram: NORMAL

## 2013-12-24 ENCOUNTER — Ambulatory Visit: Payer: Self-pay | Admitting: Pain Medicine

## 2013-12-30 DIAGNOSIS — C801 Malignant (primary) neoplasm, unspecified: Secondary | ICD-10-CM

## 2013-12-30 HISTORY — DX: Malignant (primary) neoplasm, unspecified: C80.1

## 2014-04-19 ENCOUNTER — Ambulatory Visit: Payer: Self-pay | Admitting: Family Medicine

## 2014-05-02 ENCOUNTER — Ambulatory Visit: Payer: Self-pay | Admitting: Family Medicine

## 2014-05-14 ENCOUNTER — Ambulatory Visit (INDEPENDENT_AMBULATORY_CARE_PROVIDER_SITE_OTHER): Payer: BC Managed Care – PPO

## 2014-05-14 ENCOUNTER — Ambulatory Visit (INDEPENDENT_AMBULATORY_CARE_PROVIDER_SITE_OTHER): Payer: BC Managed Care – PPO | Admitting: Podiatry

## 2014-05-14 DIAGNOSIS — M2042 Other hammer toe(s) (acquired), left foot: Secondary | ICD-10-CM

## 2014-05-14 DIAGNOSIS — M779 Enthesopathy, unspecified: Secondary | ICD-10-CM

## 2014-05-14 MED ORDER — TRIAMCINOLONE ACETONIDE 10 MG/ML IJ SUSP
10.0000 mg | Freq: Once | INTRAMUSCULAR | Status: AC
  Administered 2014-05-14: 10 mg

## 2014-05-16 NOTE — Progress Notes (Signed)
Subjective:     Patient ID: Bonnie Rowland, female   DOB: 04-09-1941, 73 y.o.   MRN: [id number]  HPIpatient presents with inflammatory changes second digit left foot that been painful and also is noted to have keratotic lesions on the fifth metatarsal shaft area of both feet that are painful when pressed   Review of Systems     Objective:   Physical Exam Neurovascular status found to be unchanged with diabetes in good control and inflammatory change dorsal second toe left it's tender when pressed and lesions on the plantar aspect of both feet that are painful    Assessment:     At risk diabetic with inflammatory changes second digit left that has fluid in it and lesions on the plantar aspect of both feet    Plan:     Injected the interphalangeal joint of the left second toe 2 mg dexamethasone 2 mg Xylocaine and debrided lesions on both feet

## 2014-05-21 ENCOUNTER — Ambulatory Visit (INDEPENDENT_AMBULATORY_CARE_PROVIDER_SITE_OTHER): Payer: BC Managed Care – PPO | Admitting: Cardiovascular Disease

## 2014-05-21 ENCOUNTER — Encounter: Payer: Self-pay | Admitting: Cardiovascular Disease

## 2014-05-21 VITALS — BP 158/90 | HR 102 | Ht 63.5 in | Wt 202.5 lb

## 2014-05-21 DIAGNOSIS — M179 Osteoarthritis of knee, unspecified: Secondary | ICD-10-CM | POA: Insufficient documentation

## 2014-05-21 DIAGNOSIS — M1711 Unilateral primary osteoarthritis, right knee: Secondary | ICD-10-CM

## 2014-05-21 DIAGNOSIS — E1165 Type 2 diabetes mellitus with hyperglycemia: Secondary | ICD-10-CM

## 2014-05-21 DIAGNOSIS — IMO0002 Reserved for concepts with insufficient information to code with codable children: Secondary | ICD-10-CM

## 2014-05-21 DIAGNOSIS — M1710 Unilateral primary osteoarthritis, unspecified knee: Secondary | ICD-10-CM | POA: Insufficient documentation

## 2014-05-21 DIAGNOSIS — I701 Atherosclerosis of renal artery: Secondary | ICD-10-CM

## 2014-05-21 DIAGNOSIS — E785 Hyperlipidemia, unspecified: Secondary | ICD-10-CM

## 2014-05-21 DIAGNOSIS — I10 Essential (primary) hypertension: Secondary | ICD-10-CM

## 2014-05-21 DIAGNOSIS — R0789 Other chest pain: Secondary | ICD-10-CM

## 2014-05-21 MED ORDER — LOSARTAN POTASSIUM-HCTZ 100-12.5 MG PO TABS: 1.0000 | ORAL_TABLET | Freq: Every day | ORAL | Status: DC

## 2014-05-21 NOTE — Assessment & Plan Note (Signed)
Severe pain in the right knee, limiting her ability to exercise. Recommended weight loss. She feels that she will need a total knee replacement.

## 2014-05-21 NOTE — Patient Instructions (Signed)
You are doing well. No medication changes were made.  Please call us if you have new issues that need to be addressed before your next appt.  Your physician wants you to follow-up in: 12 months.  You will receive a reminder letter in the mail two months in advance. If you don't receive a letter, please call our office to schedule the follow-up appointment. 

## 2014-05-21 NOTE — Assessment & Plan Note (Signed)
Recheck of her blood pressure in the office 903 systolic. Recommended that she proceed monitor her blood pressure and call our office if this continues to run high

## 2014-05-21 NOTE — Assessment & Plan Note (Signed)
We have encouraged continued exercise, careful diet management in an effort to lose weight. 

## 2014-05-21 NOTE — Progress Notes (Signed)
Patient ID: KRYSTALYNN RIDGEWAY, female    DOB: [birth year], 73 y.o.   MRN: [id number]  HPI Comments: Ms. Mcquown is a very pleasant 73 year old woman with a history of chest pain, normal coronary arteries by cardiac catheterization April 2007 , < 50%  renal artery disease,  hyperlipidemia, diabetes, hypertension, obesity who presents for routine followup of her hypertension, high cholesterol  In follow-up today, she reports that she continues to have severe knee pain on the right. She reports that it became very swollen in May 2015. Finally now back to normal but still very uncomfortable. She does not do any regular exercise. Weight has increased from 196 pounds up to 202 pounds Hemoglobin A1c by her report is 8.0. Previous hemoglobin A1c 7.6 She denies any significant shortness of breath or chest pain with exertion. She is tolerating Crestor EKG on today's visit shows sinus tachycardia with rate 102 bpm, no significant ST or T-wave changes  Previous lab work Total cholesterol 120, LDL in the 50s  Other past medical history  She also has a history of sciatica and a meniscus tear on the left. She had a echocardiogram in May 2006 which was essentially normal with ejection fraction 60%.   Stress test in 06, 03 and 01 had been normal.    Outpatient Encounter Prescriptions as of 05/21/2014  Medication Sig  . aspirin 81 MG EC tablet Take 81 mg by mouth daily.    . B Complex-C (B-COMPLEX WITH VITAMIN C) tablet Take 1 tablet by mouth daily.   . cholecalciferol (VITAMIN D) 1000 UNITS tablet Take 4,000 Units by mouth daily.   . Coenzyme Q10 (COQ-10) 100 MG CAPS Take 1 capsule by mouth at bedtime.   . fish oil-omega-3 fatty acids 1000 MG capsule Take 2 g by mouth daily.    Marland Kitchen GARLIC 6546 PO Take 1 tablet by mouth daily.    . Glucosamine 500 MG TABS Take 1 tablet by mouth daily.    . insulin glargine (LANTUS) 100 UNIT/ML injection Inject 26 Units into the skin at bedtime.   . Liraglutide (VICTOZA)  18 MG/3ML SOLN Inject into the skin as directed.    Marland Kitchen losartan (COZAAR) 100 MG tablet Take 100 mg by mouth daily.    . metformin (FORTAMET) 500 MG (OSM) 24 hr tablet Take 500 mg by mouth 2 (two) times daily with a meal.   . Multiple Vitamin (MULTIVITAMIN) tablet Take 1 tablet by mouth daily.   Marland Kitchen omeprazole (PRILOSEC) 20 MG capsule Take 20 mg by mouth every other day.   . raloxifene (EVISTA) 60 MG tablet Take 60 mg by mouth daily.    . rosuvastatin (CRESTOR) 10 MG tablet Take 10 mg by mouth every other day.   . [DISCONTINUED] Cranberry 1000 MG CAPS Take 1 capsule by mouth every other day.     Social history  reports that she has never smoked. She has never used smokeless tobacco. She reports that she drinks alcohol. She reports that she does not use illicit drugs.   Review of Systems  Constitutional: Negative.   HENT: Negative.   Eyes: Negative.   Respiratory: Negative.   Cardiovascular: Negative.   Gastrointestinal: Negative.   Endocrine: Negative.   Musculoskeletal: Positive for arthralgias.  Skin: Negative.   Allergic/Immunologic: Negative.   Neurological: Negative.   Hematological: Negative.   Psychiatric/Behavioral: Negative.   All other systems reviewed and are negative.   BP 158/90 mmHg  Pulse 102  Ht 5' 3.5" (1.613 m)  Wt  202 lb 8 oz (91.853 kg)  BMI 35.30 kg/m2  Physical Exam  Constitutional: She is oriented to person, place, and time. She appears well-developed and well-nourished.  HENT:  Head: Normocephalic.  Nose: Nose normal.  Mouth/Throat: Oropharynx is clear and moist.  Eyes: Conjunctivae are normal. Pupils are equal, round, and reactive to light.  Neck: Normal range of motion. Neck supple. No JVD present.  Cardiovascular: Normal rate, regular rhythm, S1 normal, S2 normal, normal heart sounds and intact distal pulses.  Exam reveals no gallop and no friction rub.   No murmur heard. Pulmonary/Chest: Effort normal and breath sounds normal. No respiratory  distress. She has no wheezes. She has no rales. She exhibits no tenderness.  Abdominal: Soft. Bowel sounds are normal. She exhibits no distension. There is no tenderness.  Musculoskeletal: Normal range of motion. She exhibits no edema or tenderness.  Lymphadenopathy:    She has no cervical adenopathy.  Neurological: She is alert and oriented to person, place, and time. Coordination normal.  Skin: Skin is warm and dry. No rash noted. No erythema.  Psychiatric: She has a normal mood and affect. Her behavior is normal. Judgment and thought content normal.    Assessment and Plan  Nursing note and vitals reviewed.

## 2014-05-21 NOTE — Assessment & Plan Note (Signed)
Recommended that she stay on her Crestor. In the past, lipids well controlled

## 2014-05-21 NOTE — Assessment & Plan Note (Signed)
She denies any further episodes of chest pain with exertion. No further workup at this time. We discussed her risk factors with her. Recommended she work on her weight and diabetes

## 2014-05-21 NOTE — Assessment & Plan Note (Signed)
Mild increase in the velocity on the left renal artery in the past. Last ultrasound 2012 Normal creatinine. Cholesterol well controlled.

## 2014-06-08 ENCOUNTER — Ambulatory Visit: Payer: Self-pay | Admitting: Pain Medicine

## 2014-06-11 ENCOUNTER — Ambulatory Visit (INDEPENDENT_AMBULATORY_CARE_PROVIDER_SITE_OTHER): Payer: BC Managed Care – PPO | Admitting: Podiatry

## 2014-06-11 VITALS — BP 158/90 | HR 102 | Resp 16

## 2014-06-11 DIAGNOSIS — M2042 Other hammer toe(s) (acquired), left foot: Secondary | ICD-10-CM

## 2014-06-11 DIAGNOSIS — M779 Enthesopathy, unspecified: Secondary | ICD-10-CM

## 2014-06-12 NOTE — Progress Notes (Signed)
Subjective:     Patient ID: Bonnie Rowland, female   DOB: 03-09-1941, 73 y.o.   MRN: [id number]  HPI patient presents to pickup orthotics stating the left second toe continues to give me irritation with pressure   Review of Systems  All other systems reviewed and are negative.      Objective:   Physical Exam Neurovascular status is intact with long-term diabetes that's under control and continues to experience discomfort between the second toe and big toe left foot with hyperostotic lesion on the distal medial side second toe left and also the left big toe. Also continues to experience plantar pain of a mild nature    Assessment:     Continued hyperostotic lesion between the second toe and big toe left foot along with plantar pain mild nature and long-term diabetes which is a complicating factor for her    Plan:     Reviewed all conditions and we will send the orthotics back to have made full-length. I did apply padding between the second toe and big toe left and explained to the patient that we may need to do surgery on this and explained exostectomy of the second toe. Patient wants to utilize padding and wider shoes and we will see the response

## 2014-06-14 ENCOUNTER — Ambulatory Visit: Payer: Self-pay | Admitting: Pain Medicine

## 2014-06-30 LAB — LIPID PANEL
CHOLESTEROL: 150 mg/dL (ref 0–200)
HDL: 73 mg/dL — AB (ref 35–70)
LDL Cholesterol: 63 mg/dL
Triglycerides: 71 mg/dL (ref 40–160)

## 2014-07-02 DIAGNOSIS — M810 Age-related osteoporosis without current pathological fracture: Secondary | ICD-10-CM

## 2014-07-02 HISTORY — PX: JOINT REPLACEMENT: SHX530

## 2014-07-02 HISTORY — DX: Age-related osteoporosis without current pathological fracture: M81.0

## 2014-07-16 ENCOUNTER — Encounter: Payer: Self-pay | Admitting: Podiatry

## 2014-07-19 LAB — HEMOGLOBIN A1C: Hgb A1c MFr Bld: 8.3 % — AB (ref 4.0–6.0)

## 2014-07-29 ENCOUNTER — Ambulatory Visit: Payer: Self-pay | Admitting: Pain Medicine

## 2014-09-17 ENCOUNTER — Ambulatory Visit (INDEPENDENT_AMBULATORY_CARE_PROVIDER_SITE_OTHER): Payer: BLUE CROSS/BLUE SHIELD | Admitting: Podiatry

## 2014-09-17 VITALS — BP 150/91 | HR 75 | Resp 16

## 2014-09-17 DIAGNOSIS — Q828 Other specified congenital malformations of skin: Secondary | ICD-10-CM

## 2014-09-17 NOTE — Progress Notes (Signed)
Subjective:     Patient ID: Bonnie Rowland, female   DOB: 1940-07-06, 75 y.o.   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

## 2014-10-18 ENCOUNTER — Encounter: Payer: Self-pay | Admitting: Family Medicine

## 2014-11-14 ENCOUNTER — Encounter: Payer: Self-pay | Admitting: Pain Medicine

## 2014-11-14 ENCOUNTER — Other Ambulatory Visit: Payer: Self-pay | Admitting: Pain Medicine

## 2014-11-14 DIAGNOSIS — M7062 Trochanteric bursitis, left hip: Secondary | ICD-10-CM

## 2014-11-14 DIAGNOSIS — M51369 Other intervertebral disc degeneration, lumbar region without mention of lumbar back pain or lower extremity pain: Secondary | ICD-10-CM | POA: Insufficient documentation

## 2014-11-14 DIAGNOSIS — E1340 Other specified diabetes mellitus with diabetic neuropathy, unspecified: Secondary | ICD-10-CM

## 2014-11-14 DIAGNOSIS — M5136 Other intervertebral disc degeneration, lumbar region: Secondary | ICD-10-CM

## 2014-11-14 DIAGNOSIS — G5700 Lesion of sciatic nerve, unspecified lower limb: Secondary | ICD-10-CM | POA: Insufficient documentation

## 2014-11-14 DIAGNOSIS — M533 Sacrococcygeal disorders, not elsewhere classified: Secondary | ICD-10-CM | POA: Insufficient documentation

## 2014-11-14 DIAGNOSIS — M7061 Trochanteric bursitis, right hip: Secondary | ICD-10-CM | POA: Insufficient documentation

## 2014-11-15 ENCOUNTER — Encounter: Payer: Self-pay | Admitting: Pain Medicine

## 2014-11-15 ENCOUNTER — Ambulatory Visit: Payer: BLUE CROSS/BLUE SHIELD | Attending: Pain Medicine | Admitting: Pain Medicine

## 2014-11-15 ENCOUNTER — Encounter (INDEPENDENT_AMBULATORY_CARE_PROVIDER_SITE_OTHER): Payer: Self-pay

## 2014-11-15 VITALS — BP 174/83 | HR 75 | Temp 99.3°F | Resp 18 | Ht 64.0 in | Wt 200.0 lb

## 2014-11-15 DIAGNOSIS — M79604 Pain in right leg: Secondary | ICD-10-CM | POA: Diagnosis present

## 2014-11-15 DIAGNOSIS — M47816 Spondylosis without myelopathy or radiculopathy, lumbar region: Secondary | ICD-10-CM

## 2014-11-15 DIAGNOSIS — M5124 Other intervertebral disc displacement, thoracic region: Secondary | ICD-10-CM | POA: Diagnosis not present

## 2014-11-15 DIAGNOSIS — M5126 Other intervertebral disc displacement, lumbar region: Secondary | ICD-10-CM | POA: Diagnosis not present

## 2014-11-15 DIAGNOSIS — M5136 Other intervertebral disc degeneration, lumbar region: Secondary | ICD-10-CM | POA: Insufficient documentation

## 2014-11-15 DIAGNOSIS — M79605 Pain in left leg: Secondary | ICD-10-CM | POA: Diagnosis present

## 2014-11-15 DIAGNOSIS — M62838 Other muscle spasm: Secondary | ICD-10-CM | POA: Diagnosis not present

## 2014-11-15 DIAGNOSIS — M545 Low back pain: Secondary | ICD-10-CM | POA: Diagnosis present

## 2014-11-15 DIAGNOSIS — G5700 Lesion of sciatic nerve, unspecified lower limb: Secondary | ICD-10-CM

## 2014-11-15 DIAGNOSIS — E1340 Other specified diabetes mellitus with diabetic neuropathy, unspecified: Secondary | ICD-10-CM

## 2014-11-15 MED ORDER — METHYLPREDNISOLONE ACETATE 40 MG/ML IJ SUSP
INTRAMUSCULAR | Status: AC
  Administered 2014-11-15: 09:00:00
  Filled 2014-11-15: qty 1

## 2014-11-15 MED ORDER — BUPIVACAINE HCL (PF) 0.25 % IJ SOLN
INTRAMUSCULAR | Status: AC
  Administered 2014-11-15: 09:00:00
  Filled 2014-11-15: qty 30

## 2014-11-15 NOTE — Progress Notes (Signed)
   Subjective:    Patient ID: Bonnie Rowland, female    DOB: 03-11-1941, 74 y.o.   MRN: [id number]  HPI    Review of Systems     Objective:   Physical Exam        Assessment & Plan:

## 2014-11-15 NOTE — Patient Instructions (Addendum)
Continue present medications.  F/U PCP for evaliation of  BP and general medical  condition.  F/U surgical evaluation.  F/U nrurological evaluation.  May consider radiofrequency rhizolysis or intraspinal procedures pending response to present treatment and F/U evaluation.  Patient to call Pain Management Center should patient have concerns prior to scheduled return appointment. Pain Management Discharge Instructions  General Discharge Instructions :  If you need to reach your doctor call: Monday-Friday 8:00 am - 4:00 pm at [phone] or toll free [phone].  After clinic hours [phone] to have operator reach doctor.  Bring all of your medication bottles to all your appointments in the pain clinic.  To cancel or reschedule your appointment with Pain Management please remember to call 24 hours in advance to avoid a fee.  Refer to the educational materials which you have been given on: General Risks, I had my Procedure. Discharge Instructions, Post Sedation.  Post Procedure Instructions:  The drugs you were given will stay in your system until tomorrow, so for the next 24 hours you should not drive, make any legal decisions or drink any alcoholic beverages.  You may eat anything you prefer, but it is better to start with liquids then soups and crackers, and gradually work up to solid foods.  Please notify your doctor immediately if you have any unusual bleeding, trouble breathing or pain that is not related to your normal pain.  Depending on the type of procedure that was done, some parts of your body may feel week and/or numb.  This usually clears up by tonight or the next day.  Walk with the use of an assistive device or accompanied by an adult for the 24 hours.  You may use ice on the affected area for the first 24 hours.  Put ice in a Ziploc bag and cover with a towel and place against area 15 minutes on 15 minutes off.  You may switch to heat after 24 hours.GENERAL  RISKS AND COMPLICATIONS  What are the risk, side effects and possible complications? Generally speaking, most procedures are safe.  However, with any procedure there are risks, side effects, and the possibility of complications.  The risks and complications are dependent upon the sites that are lesioned, or the type of nerve block to be performed.  The closer the procedure is to the spine, the more serious the risks are.  Great care is taken when placing the radio frequency needles, block needles or lesioning probes, but sometimes complications can occur. 1. Infection: Any time there is an injection through the skin, there is a risk of infection.  This is why sterile conditions are used for these blocks.  There are four possible types of infection. 1. Localized skin infection. 2. Central Nervous System Infection-This can be in the form of Meningitis, which can be deadly. 3. Epidural Infections-This can be in the form of an epidural abscess, which can cause pressure inside of the spine, causing compression of the spinal cord with subsequent paralysis. This would require an emergency surgery to decompress, and there are no guarantees that the patient would recover from the paralysis. 4. Discitis-This is an infection of the intervertebral discs.  It occurs in about 1% of discography procedures.  It is difficult to treat and it may lead to surgery.        2. Pain: the needles have to go through skin and soft tissues, will cause soreness.       3. Damage to internal structures:  The nerves  to be lesioned may be near blood vessels or    other nerves which can be potentially damaged.       4. Bleeding: Bleeding is more common if the patient is taking blood thinners such as  aspirin, Coumadin, Ticiid, Plavix, etc., or if he/she have some genetic predisposition  such as hemophilia. Bleeding into the spinal canal can cause compression of the spinal  cord with subsequent paralysis.  This would require an emergency  surgery to  decompress and there are no guarantees that the patient would recover from the  paralysis.       5. Pneumothorax:  Puncturing of a lung is a possibility, every time a needle is introduced in  the area of the chest or upper back.  Pneumothorax refers to free air around the  collapsed lung(s), inside of the thoracic cavity (chest cavity).  Another two possible  complications related to a similar event would include: Hemothorax and Chylothorax.   These are variations of the Pneumothorax, where instead of air around the collapsed  lung(s), you may have blood or chyle, respectively.       6. Spinal headaches: They may occur with any procedures in the area of the spine.       7. Persistent CSF (Cerebro-Spinal Fluid) leakage: This is a rare problem, but may occur  with prolonged intrathecal or epidural catheters either due to the formation of a fistulous  track or a dural tear.       8. Nerve damage: By working so close to the spinal cord, there is always a possibility of  nerve damage, which could be as serious as a permanent spinal cord injury with  paralysis.       9. Death:  Although rare, severe deadly allergic reactions known as "Anaphylactic  reaction" can occur to any of the medications used.      10. Worsening of the symptoms:  We can always make thing worse.  What are the chances of something like this happening? Chances of any of this occuring are extremely low.  By statistics, you have more of a chance of getting killed in a motor vehicle accident: while driving to the hospital than any of the above occurring .  Nevertheless, you should be aware that they are possibilities.  In general, it is similar to taking a shower.  Everybody knows that you can slip, hit your head and get killed.  Does that mean that you should not shower again?  Nevertheless always keep in mind that statistics do not mean anything if you happen to be on the wrong side of them.  Even if a procedure has a 1 (one) in a  1,000,000 (million) chance of going wrong, it you happen to be that one..Also, keep in mind that by statistics, you have more of a chance of having something go wrong when taking medications.  Who should not have this procedure? If you are on a blood thinning medication (e.g. Coumadin, Plavix, see list of "Blood Thinners"), or if you have an active infection going on, you should not have the procedure.  If you are taking any blood thinners, please inform your physician.  How should I prepare for this procedure?  Do not eat or drink anything at least six hours prior to the procedure.  Bring a driver with you .  It cannot be a taxi.  Come accompanied by an adult that can drive you back, and that is strong enough to help you if  your legs get weak or numb from the local anesthetic.  Take all of your medicines the morning of the procedure with just enough water to swallow them.  If you have diabetes, make sure that you are scheduled to have your procedure done first thing in the morning, whenever possible.  If you have diabetes, take only half of your insulin dose and notify our nurse that you have done so as soon as you arrive at the clinic.  If you are diabetic, but only take blood sugar pills (oral hypoglycemic), then do not take them on the morning of your procedure.  You may take them after you have had the procedure.  Do not take aspirin or any aspirin-containing medications, at least eleven (11) days prior to the procedure.  They may prolong bleeding.  Wear loose fitting clothing that may be easy to take off and that you would not mind if it got stained with Betadine or blood.  Do not wear any jewelry or perfume  Remove any nail coloring.  It will interfere with some of our monitoring equipment.  NOTE: Remember that this is not meant to be interpreted as a complete list of all possible complications.  Unforeseen problems may occur.  BLOOD THINNERS The following drugs contain aspirin  or other products, which can cause increased bleeding during surgery and should not be taken for 2 weeks prior to and 1 week after surgery.  If you should need take something for relief of minor pain, you may take acetaminophen which is found in Tylenol,m Datril, Anacin-3 and Panadol. It is not blood thinner. The products listed below are.  Do not take any of the products listed below in addition to any listed on your instruction sheet.  A.P.C or A.P.C with Codeine Codeine Phosphate Capsules #3 Ibuprofen Ridaura  ABC compound Congesprin Imuran rimadil  Advil Cope Indocin Robaxisal  Alka-Seltzer Effervescent Pain Reliever and Antacid Coricidin or Coricidin-D  Indomethacin Rufen  Alka-Seltzer plus Cold Medicine Cosprin Ketoprofen S-A-C Tablets  Anacin Analgesic Tablets or Capsules Coumadin Korlgesic Salflex  Anacin Extra Strength Analgesic tablets or capsules CP-2 Tablets Lanoril Salicylate  Anaprox Cuprimine Capsules Levenox Salocol  Anexsia-D Dalteparin Magan Salsalate  Anodynos Darvon compound Magnesium Salicylate Sine-off  Ansaid Dasin Capsules Magsal Sodium Salicylate  Anturane Depen Capsules Marnal Soma  APF Arthritis pain formula Dewitt's Pills Measurin Stanback  Argesic Dia-Gesic Meclofenamic Sulfinpyrazone  Arthritis Bayer Timed Release Aspirin Diclofenac Meclomen Sulindac  Arthritis pain formula Anacin Dicumarol Medipren Supac  Analgesic (Safety coated) Arthralgen Diffunasal Mefanamic Suprofen  Arthritis Strength Bufferin Dihydrocodeine Mepro Compound Suprol  Arthropan liquid Dopirydamole Methcarbomol with Aspirin Synalgos  ASA tablets/Enseals Disalcid Micrainin Tagament  Ascriptin Doan's Midol Talwin  Ascriptin A/D Dolene Mobidin Tanderil  Ascriptin Extra Strength Dolobid Moblgesic Ticlid  Ascriptin with Codeine Doloprin or Doloprin with Codeine Momentum Tolectin  Asperbuf Duoprin Mono-gesic Trendar  Aspergum Duradyne Motrin or Motrin IB Triminicin  Aspirin plain, buffered or  enteric coated Durasal Myochrisine Trigesic  Aspirin Suppositories Easprin Nalfon Trillsate  Aspirin with Codeine Ecotrin Regular or Extra Strength Naprosyn Uracel  Atromid-S Efficin Naproxen Ursinus  Auranofin Capsules Elmiron Neocylate Vanquish  Axotal Emagrin Norgesic Verin  Azathioprine Empirin or Empirin with Codeine Normiflo Vitamin E  Azolid Emprazil Nuprin Voltaren  Bayer Aspirin plain, buffered or children's or timed BC Tablets or powders Encaprin Orgaran Warfarin Sodium  Buff-a-Comp Enoxaparin Orudis Zorpin  Buff-a-Comp with Codeine Equegesic Os-Cal-Gesic   Buffaprin Excedrin plain, buffered or Extra Strength Oxalid   Bufferin Arthritis  Strength Feldene Oxphenbutazone   Bufferin plain or Extra Strength Feldene Capsules Oxycodone with Aspirin   Bufferin with Codeine Fenoprofen Fenoprofen Pabalate or Pabalate-SF   Buffets II Flogesic Panagesic   Buffinol plain or Extra Strength Florinal or Florinal with Codeine Panwarfarin   Buf-Tabs Flurbiprofen Penicillamine   Butalbital Compound Four-way cold tablets Penicillin   Butazolidin Fragmin Pepto-Bismol   Carbenicillin Geminisyn Percodan   Carna Arthritis Reliever Geopen Persantine   Carprofen Gold's salt Persistin   Chloramphenicol Goody's Phenylbutazone   Chloromycetin Haltrain Piroxlcam   Clmetidine heparin Plaquenil   Cllnoril Hyco-pap Ponstel   Clofibrate Hydroxy chloroquine Propoxyphen         Before stopping any of these medications, be sure to consult the physician who ordered them.  Some, such as Coumadin (Warfarin) are ordered to prevent or treat serious conditions such as "deep thrombosis", "pumonary embolisms", and other heart problems.  The amount of time that you may need off of the medication may also vary with the medication and the reason for which you were taking it.  If you are taking any of these medications, please make sure you notify your pain physician before you undergo any procedures.          

## 2014-11-15 NOTE — Progress Notes (Signed)
PROCEDURE PERFORMED: Cluneal sciatic nerve block   NOTE: The patient is a 74 y.o. female who returns to [place] for further evaluation and treatment of pain involving the lumbar and lower extremity region with pain occurring in the region of the buttocks and lower extremity of significant degree. Prior studies revealed the patient to be with degenerative changes of the lumbar spine with annular disc bulging at T12-L1 central to right paracentral disc protrusion versus osteophyte with multilevel degenerative disc disease noted throughout the lumbar spine. 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 We will proceed with cluneal and sciatic nerve blocks The risks, benefits, and expectations of the procedure have been discussed and explained to the patient who was understanding and in agreement with suggested treatment plan. We will proceed with interventional treatment as discussed and as explained to the patient who wishes to proceed with proposed treatment.   PROCEDURE #1: Right cluneal nerve block with EKG, blood pressure, pulse, and pulse oximetry monitoring. The procedure was performed with the patient in the lateral decubitus position. Following alcohol prep of proposed entry site and identification of landmarks, a 22 -gauge needle was inserted into the gluteal musculature region and following elicitation of paresthesias radiating to the buttocks, needle was slightly withdrawn and following negative aspiration, a total of 4 mL of 0.25% bupivacaine with Kenalog injected for right  cluneal nerve block. Needle removed. The patient tolerated the injection well.   PROCEDURE #2: Right sciatic nerve block with EKG, blood pressure, pulse, and pulse oximetry monitoring. The procedure was performed with the patient in the lateral decubitus position. Following identification of  greater trochanter for establishing point of needle entry and alcohol prep of proposed entry site, a 22 -gauge needle was inserted and following elicitation of paresthesias radiating from buttocks to the foot, needle was slightly withdrawn. Following negative aspiration, a total of 4 mL of 0.25% bupivacaine with Kenalog injected for right sciatic nerve block. Needle removed. The patient tolerated injection well. A total of 10 mg of Kenalog was utilized for the procedure.   PLAN:   1. Medications: Will continue presently prescribed medications. 2. Will consider modification of treatment regimen pending response to treatment rendered on today's visit and follow-up evaluation. 3. The patient is to follow-up with primary care physician, Dr. Rutherford Nail, regarding blood pressure and general medical condition status pose procedure performed on today's visit. 4. Surgical evaluation as discussed. 5. Neurological evaluation as discussed. 6. The patient may be a candidate for radiofrequency procedures, Botox injections, implantation type procedures, and other treatment pending response to treatment rendered on today's visit and follow-up evaluation. 7. The patient has been advised to adhere to proper body mechanics and avoid activities which appear to aggravate condition. 8. The patient has been advised to call the Pain Management Center prior to scheduled return appointment should there be significant change in condition or should patient have other concerns regarding condition prior to scheduled return appointment.  The patient is understanding and in agreement with suggested treatment plan.

## 2014-11-15 NOTE — Progress Notes (Signed)
   Subjective:    Patient ID: Bonnie Rowland, female    DOB: 08/09/1940, 74 y.o.   MRN: [id number]  HPI    Review of Systems     Objective:   Physical Exam        Assessment & Plan:

## 2014-11-15 NOTE — Progress Notes (Signed)
Discharged via [place] to home. Tolerated procedure well. teachback 3 done.

## 2014-11-16 ENCOUNTER — Telehealth: Payer: Self-pay | Admitting: *Deleted

## 2014-11-16 NOTE — Telephone Encounter (Signed)
Denies any problems post procedure

## 2014-12-02 ENCOUNTER — Other Ambulatory Visit: Payer: Self-pay | Admitting: Family Medicine

## 2014-12-03 ENCOUNTER — Other Ambulatory Visit: Payer: Self-pay | Admitting: Pain Medicine

## 2014-12-03 DIAGNOSIS — M170 Bilateral primary osteoarthritis of knee: Secondary | ICD-10-CM

## 2014-12-03 DIAGNOSIS — E1340 Other specified diabetes mellitus with diabetic neuropathy, unspecified: Secondary | ICD-10-CM

## 2014-12-03 DIAGNOSIS — G5700 Lesion of sciatic nerve, unspecified lower limb: Secondary | ICD-10-CM

## 2014-12-03 DIAGNOSIS — M7061 Trochanteric bursitis, right hip: Secondary | ICD-10-CM

## 2014-12-03 DIAGNOSIS — M533 Sacrococcygeal disorders, not elsewhere classified: Secondary | ICD-10-CM

## 2014-12-03 DIAGNOSIS — M5136 Other intervertebral disc degeneration, lumbar region: Secondary | ICD-10-CM

## 2014-12-03 DIAGNOSIS — M7062 Trochanteric bursitis, left hip: Secondary | ICD-10-CM

## 2014-12-06 ENCOUNTER — Ambulatory Visit: Payer: Self-pay | Admitting: Family Medicine

## 2014-12-06 ENCOUNTER — Ambulatory Visit: Payer: BLUE CROSS/BLUE SHIELD | Attending: Pain Medicine | Admitting: Pain Medicine

## 2014-12-06 ENCOUNTER — Ambulatory Visit: Payer: BLUE CROSS/BLUE SHIELD | Admitting: Pain Medicine

## 2014-12-06 ENCOUNTER — Encounter: Payer: Self-pay | Admitting: Pain Medicine

## 2014-12-06 VITALS — BP 123/48 | HR 73 | Temp 99.6°F | Resp 16 | Ht 64.0 in | Wt 199.0 lb

## 2014-12-06 DIAGNOSIS — M7060 Trochanteric bursitis, unspecified hip: Secondary | ICD-10-CM | POA: Diagnosis not present

## 2014-12-06 DIAGNOSIS — M7062 Trochanteric bursitis, left hip: Secondary | ICD-10-CM

## 2014-12-06 DIAGNOSIS — E1140 Type 2 diabetes mellitus with diabetic neuropathy, unspecified: Secondary | ICD-10-CM | POA: Insufficient documentation

## 2014-12-06 DIAGNOSIS — I739 Peripheral vascular disease, unspecified: Secondary | ICD-10-CM | POA: Diagnosis not present

## 2014-12-06 DIAGNOSIS — M5136 Other intervertebral disc degeneration, lumbar region: Secondary | ICD-10-CM | POA: Diagnosis not present

## 2014-12-06 DIAGNOSIS — M48062 Spinal stenosis, lumbar region with neurogenic claudication: Secondary | ICD-10-CM | POA: Insufficient documentation

## 2014-12-06 DIAGNOSIS — G5700 Lesion of sciatic nerve, unspecified lower limb: Secondary | ICD-10-CM

## 2014-12-06 DIAGNOSIS — M533 Sacrococcygeal disorders, not elsewhere classified: Secondary | ICD-10-CM | POA: Diagnosis not present

## 2014-12-06 DIAGNOSIS — M545 Low back pain: Secondary | ICD-10-CM | POA: Diagnosis present

## 2014-12-06 DIAGNOSIS — M4806 Spinal stenosis, lumbar region: Secondary | ICD-10-CM | POA: Insufficient documentation

## 2014-12-06 DIAGNOSIS — M7061 Trochanteric bursitis, right hip: Secondary | ICD-10-CM

## 2014-12-06 DIAGNOSIS — M170 Bilateral primary osteoarthritis of knee: Secondary | ICD-10-CM

## 2014-12-06 NOTE — Patient Instructions (Addendum)
Continue present medications.  Lumbar epidural steroid injection Wednesday, 12/15/2014. You will need permission of Dr. Rutherford Nail to hold aspirin for 5 days prior to the procedure as well as medical clearance for the procedure lumbar epidural steroid injection  F/U PCP for evaliation of  BP and general medical  condition.  F/U surgical evaluation.  F/U neurological evaluation.  May consider radiofrequency rhizolysis or intraspinal procedures pending response to present treatment and F/U evaluation.  Patient to call Pain Management Center should patient have concerns prior to scheduled return appointment. Epidural Steroid Injection Patient Information  Description: The epidural space surrounds the nerves as they exit the spinal cord.  In some patients, the nerves can be compressed and inflamed by a bulging disc or a tight spinal canal (spinal stenosis).  By injecting steroids into the epidural space, we can bring irritated nerves into direct contact with a potentially helpful medication.  These steroids act directly on the irritated nerves and can reduce swelling and inflammation which often leads to decreased pain.  Epidural steroids may be injected anywhere along the spine and from the neck to the low back depending upon the location of your pain.   After numbing the skin with local anesthetic (like Novocaine), a small needle is passed into the epidural space slowly.  You may experience a sensation of pressure while this is being done.  The entire block usually last less than 10 minutes.  Conditions which may be treated by epidural steroids:   Low back and leg pain  Neck and arm pain  Spinal stenosis  Post-laminectomy syndrome  Herpes zoster (shingles) pain  Pain from compression fractures  Preparation for the injection:  1. Do not eat any solid food or dairy products within 6 hours of your appointment.  2. You may drink clear liquids up to 2 hours before appointment.  Clear  liquids include water, black coffee, juice or soda.  No milk or cream please. 3. You may take your regular medication, including pain medications, with a sip of water before your appointment  Diabetics should hold regular insulin (if taken separately) and take 1/2 normal NPH dos the morning of the procedure.  Carry some sugar containing items with you to your appointment. 4. A driver must accompany you and be prepared to drive you home after your procedure.  5. Bring all your current medications with your. 6. An IV may be inserted and sedation may be given at the discretion of the physician.   7. A blood pressure cuff, EKG and other monitors will often be applied during the procedure.  Some patients may need to have extra oxygen administered for a short period. 8. You will be asked to provide medical information, including your allergies, prior to the procedure.  We must know immediately if you are taking blood thinners (like Coumadin/Warfarin)  Or if you are allergic to IV iodine contrast (dye). We must know if you could possible be pregnant.  Possible side-effects:  Bleeding from needle site  Infection (rare, may require surgery)  Nerve injury (rare)  Numbness & tingling (temporary)  Difficulty urinating (rare, temporary)  Spinal headache ( a headache worse with upright posture)  Light -headedness (temporary)  Pain at injection site (several days)  Decreased blood pressure (temporary)  Weakness in arm/leg (temporary)  Pressure sensation in back/neck (temporary)  Call if you experience:  Fever/chills associated with headache or increased back/neck pain.  Headache worsened by an upright position.  New onset weakness or numbness of an extremity  below the injection site  Hives or difficulty breathing (go to the emergency room)  Inflammation or drainage at the infection site  Severe back/neck pain  Any new symptoms which are concerning to you  Please note:  Although the  local anesthetic injected can often make your back or neck feel good for several hours after the injection, the pain will likely return.  It takes 3-7 days for steroids to work in the epidural space.  You may not notice any pain relief for at least that one week.  If effective, we will often do a series of three injections spaced 3-6 weeks apart to maximally decrease your pain.  After the initial series, we generally will wait several months before considering a repeat injection of the same type.  If you have any questions, please call [phone] [facility]

## 2014-12-06 NOTE — Progress Notes (Signed)
Safety precautions to be maintained throughout the outpatient stay will include: orient to surroundings, keep bed in low position, maintain call bell within reach at all times, provide assistance with transfer out of bed and ambulation.  

## 2014-12-06 NOTE — Progress Notes (Signed)
Discharged at 1205, ambulatory.

## 2014-12-06 NOTE — Progress Notes (Signed)
   Subjective:    Patient ID: Bonnie Rowland, female    DOB: Aug 19, 1940, 74 y.o.   MRN: [id number]  HPI  Patient is 74 year old female returns to [place] for further evaluation and treatment of pain involving the lower back and lower extremity regions. Patient has had gradual increase of her pain which is aggravated by standing and walking and associated with some lower extremity weakness with prolonged standing and walking. We will consider lumbar epidural steroid injection at time return appointment in attempt to decrease severity of symptoms and hopefully retard progression of patient's symptoms. The patient was understanding and agrees with suggested treatment plan    Review of Systems     Objective:   Physical Exam  There was mild tenderness over the splenius capitis and occipitalis musculature regions. Palpation of the acromioclavicular and glenohumeral joint regions reproduced minimal discomfort. Patient was with bilaterally equal grip grip strength and Tinel and Phalen's maneuver were without increased pain of significant degree. No crepitus of the thoracic region was noted. Palpation over the lumbar paraspinal musculature region lumbar facet region reproduced moderate to moderately severe discomfort with severe tenderness over the gluteal and piriformis musculature region and the lumbar paraspinal musculature regions. There was significant tenderness of the greater trochanteric region. Straight leg raising limited to 20 without increased pain with dorsiflexion noted. EHL strength was decreased. No definite sensory deficit of dermatomal distribution detected. 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 No abdominal tenderness to palpation and no costovertebral angle tenderness noted.      Assessment & Plan:  Degenerative disc disease lumbar spine  Lumbar stenosis with neurogenic claudication  Lumbar facet syndrome  Diabetic neuropathy  Greater trochanteric  bursitis  Sacroiliac joint dysfunction    Plan  Continue present medications  Lumbar epidural steroid injection at time of return appointment.  F/U PCP for evaliation of  BP and general medical  condition.  F/U surgical evaluation.  F/U neurological evaluation.  May consider radiofrequency rhizolysis or intraspinal procedures pending response to present treatment and F/U evaluation.  Patient to call Pain Management Center should patient have concerns prior to scheduled return appointment.

## 2014-12-14 ENCOUNTER — Other Ambulatory Visit: Payer: Self-pay | Admitting: Pain Medicine

## 2014-12-14 DIAGNOSIS — E1340 Other specified diabetes mellitus with diabetic neuropathy, unspecified: Secondary | ICD-10-CM

## 2014-12-14 DIAGNOSIS — Z7982 Long term (current) use of aspirin: Secondary | ICD-10-CM

## 2014-12-14 DIAGNOSIS — M48062 Spinal stenosis, lumbar region with neurogenic claudication: Secondary | ICD-10-CM

## 2014-12-14 DIAGNOSIS — M47816 Spondylosis without myelopathy or radiculopathy, lumbar region: Secondary | ICD-10-CM

## 2014-12-15 ENCOUNTER — Ambulatory Visit: Payer: BLUE CROSS/BLUE SHIELD | Admitting: Pain Medicine

## 2014-12-15 ENCOUNTER — Other Ambulatory Visit
Admission: RE | Admit: 2014-12-15 | Discharge: 2014-12-15 | Disposition: A | Discharge status: Home / Self Care | Payer: BLUE CROSS/BLUE SHIELD | Source: Ambulatory Visit | Attending: Pain Medicine | Admitting: Pain Medicine

## 2014-12-15 ENCOUNTER — Encounter: Payer: Self-pay | Admitting: Pain Medicine

## 2014-12-15 VITALS — BP 151/54 | HR 65 | Temp 97.3°F | Resp 16 | Ht 64.0 in | Wt 200.0 lb

## 2014-12-15 DIAGNOSIS — E1340 Other specified diabetes mellitus with diabetic neuropathy, unspecified: Secondary | ICD-10-CM | POA: Diagnosis not present

## 2014-12-15 DIAGNOSIS — M7061 Trochanteric bursitis, right hip: Secondary | ICD-10-CM

## 2014-12-15 DIAGNOSIS — M48062 Spinal stenosis, lumbar region with neurogenic claudication: Secondary | ICD-10-CM

## 2014-12-15 DIAGNOSIS — M7062 Trochanteric bursitis, left hip: Secondary | ICD-10-CM

## 2014-12-15 DIAGNOSIS — M533 Sacrococcygeal disorders, not elsewhere classified: Secondary | ICD-10-CM

## 2014-12-15 DIAGNOSIS — G5700 Lesion of sciatic nerve, unspecified lower limb: Secondary | ICD-10-CM

## 2014-12-15 DIAGNOSIS — M5136 Other intervertebral disc degeneration, lumbar region: Secondary | ICD-10-CM

## 2014-12-15 LAB — PLATELET FUNCTION ASSAY
COLLAGEN / ADP: 87 s (ref 0–118)
Collagen / Epinephrine: >300 seconds — ABNORMAL HIGH (ref 0–193)

## 2014-12-15 LAB — PROTIME-INR
INR: 1.01
Prothrombin Time: 13.5 seconds (ref 11.4–15.0)

## 2014-12-15 LAB — APTT: aPTT: 33 seconds (ref 24–36)

## 2014-12-15 MED ORDER — TRIAMCINOLONE ACETONIDE 40 MG/ML IJ SUSP
INTRAMUSCULAR | Status: AC
  Filled 2014-12-15: qty 1

## 2014-12-15 MED ORDER — SODIUM CHLORIDE 0.9 % IJ SOLN
INTRAMUSCULAR | Status: AC
  Administered 2014-12-15: 20 mL
  Filled 2014-12-15: qty 20

## 2014-12-15 MED ORDER — ORPHENADRINE CITRATE 30 MG/ML IJ SOLN
INTRAMUSCULAR | Status: AC
  Administered 2014-12-15: 60 mg
  Filled 2014-12-15: qty 2

## 2014-12-15 MED ORDER — FENTANYL CITRATE (PF) 100 MCG/2ML IJ SOLN
INTRAMUSCULAR | Status: AC
  Administered 2014-12-15: 100 ug via INTRAVENOUS
  Filled 2014-12-15: qty 2

## 2014-12-15 MED ORDER — MIDAZOLAM HCL 5 MG/5ML IJ SOLN
INTRAMUSCULAR | Status: AC
  Administered 2014-12-15: 2 mg via INTRAVENOUS
  Filled 2014-12-15: qty 5

## 2014-12-15 MED ORDER — CEFUROXIME AXETIL 250 MG PO TABS: 250.0000 mg | ORAL_TABLET | Freq: Two times a day (BID) | ORAL | Status: DC

## 2014-12-15 MED ORDER — LIDOCAINE HCL (PF) 1 % IJ SOLN
INTRAMUSCULAR | Status: AC
  Administered 2014-12-15: 5 mL
  Filled 2014-12-15: qty 5

## 2014-12-15 MED ORDER — BUPIVACAINE HCL (PF) 0.25 % IJ SOLN
INTRAMUSCULAR | Status: AC
  Administered 2014-12-15: 30 mL
  Filled 2014-12-15: qty 30

## 2014-12-15 NOTE — Progress Notes (Signed)
Safety precautions to be maintained throughout the outpatient stay will include: orient to surroundings, keep bed in low position, maintain call bell within reach at all times, provide assistance with transfer out of bed and ambulation.  Tolerated po fluids well.  

## 2014-12-15 NOTE — Patient Instructions (Addendum)
Continue present medications and antibiotics. Please obtain your antibiotic today and begin taking antibiotic today  F/U PCP for evaliation of  BP and general medical  condition.  F/U surgical evaluation as discussed  F/U neurological evaluation.  May consider radiofrequency rhizolysis or intraspinal procedures pending response to present treatment and F/U evaluation.  Patient to call [facility] Center should patient have concerns prior to scheduled return appointment.    Pain Management Discharge Instructions  General Discharge Instructions :  If you need to reach your doctor call: Monday-Friday 8:00 am - 4:00 pm at [phone] or toll free [phone].  After clinic hours [phone] to have operator reach doctor.  Bring all of your medication bottles to all your appointments in the pain clinic.  To cancel or reschedule your appointment with Pain Management please remember to call 24 hours in advance to avoid a fee.  Refer to the educational materials which you have been given on: General Risks, I had my Procedure. Discharge Instructions, Post Sedation.  Post Procedure Instructions:  The drugs you were given will stay in your system until tomorrow, so for the next 24 hours you should not drive, make any legal decisions or drink any alcoholic beverages.  You may eat anything you prefer, but it is better to start with liquids then soups and crackers, and gradually work up to solid foods.  Please notify your doctor immediately if you have any unusual bleeding, trouble breathing or pain that is not related to your normal pain.  Depending on the type of procedure that was done, some parts of your body may feel week and/or numb.  This usually clears up by tonight or the next day.  Walk with the use of an assistive device or accompanied by an adult for the 24 hours.  You may use ice on the affected area for the first 24 hours.  Put ice in a Ziploc bag and cover with a towel  and place against area 15 minutes on 15 minutes off.  You may switch to heat after 24 hours.  A prescription for CEFTIN was sent to your pharmacy and should be available for pickup today.

## 2014-12-15 NOTE — Progress Notes (Signed)
   Subjective:    Patient ID: Bonnie Rowland, female    DOB: 10-28-1940, 74 y.o.   MRN: [id number]  HPI   PROCEDURE PERFORMED: Lumbar epidural steroid injection   NOTE: The patient is a 74 y.o. female who returns to [facility] for further evaluation and treatment of pain involving the lumbar and lower extremity region. MRI revealed the patient to be with multilevel degenerative changes of the lumbar spine with disc protrusion L1 vertebral body level with right paracentral disc protrusion versus osteophyte. Multilevel disc bulging and degenerative changes noted throughout the lumbar spine. The risks, benefits, and expectations of the procedure have been discussed and explained to the patient who was understanding and in agreement with suggested treatment plan. We will proceed with interventional treatment as discussed and explained to the patient who is willing to proceed with procedure as planned.   DESCRIPTION OF PROCEDURE: Lumbar epidural steroid injection with IV Versed, IV fentanyl conscious sedation, EKG, blood pressure, pulse, and pulse oximetry monitoring. The procedure was performed with the patient in the prone position under fluoroscopic guidance. A local anesthetic skin wheal of 1.5% plain lidocaine was accomplished at proposed entry site. An 18-gauge Tuohy epidural needle was inserted at the L 3 vertebral body level right of the midline via loss-of-resistance technique with negative heme and negative CSF return. A total of 4 mL of Preservative-Free normal saline with 40 mg of Kenalog injected incrementally via epidurally placed needle. Needle removed.  The patient tolerated the injection well.   PLAN:   1. Medications: We will continue presently prescribed medications. 2. Will consider modification of treatment regimen pending response to treatment rendered on today's visit and follow-up evaluation. 3. The patient is to follow-up with primary care physician regarding  blood pressure and general medical condition status post lumbar epidural steroid injection performed on today's visit. 4. Surgical evaluation. 5. Neurological evaluation. 6. The patient may be a candidate for radiofrequency procedures, implantation device, and other treatment pending response to treatment and follow-up evaluation. 7. The patient has been advised to adhere to proper body mechanics and avoid activities which appear to aggravate condition. 8. The patient has been advised to call the Pain Management Center prior to scheduled return appointment should there be significant change in condition or should there be significant   The patient is understanding and agrees with suggested treatment plan    Review of Systems     Objective:   Physical Exam        Assessment & Plan:

## 2014-12-16 ENCOUNTER — Other Ambulatory Visit: Payer: Self-pay | Admitting: Family Medicine

## 2014-12-16 ENCOUNTER — Telehealth: Payer: Self-pay | Admitting: *Deleted

## 2014-12-16 NOTE — Telephone Encounter (Signed)
Patient denies any complications post procedure 

## 2014-12-27 ENCOUNTER — Encounter: Payer: Self-pay | Admitting: Family Medicine

## 2014-12-27 ENCOUNTER — Ambulatory Visit (INDEPENDENT_AMBULATORY_CARE_PROVIDER_SITE_OTHER): Payer: BLUE CROSS/BLUE SHIELD | Admitting: Family Medicine

## 2014-12-27 ENCOUNTER — Ambulatory Visit: Payer: BLUE CROSS/BLUE SHIELD | Admitting: Podiatry

## 2014-12-27 ENCOUNTER — Encounter (INDEPENDENT_AMBULATORY_CARE_PROVIDER_SITE_OTHER): Payer: Self-pay

## 2014-12-27 VITALS — BP 138/60 | HR 82 | Temp 99.8°F | Ht 64.0 in | Wt 200.4 lb

## 2014-12-27 DIAGNOSIS — J0100 Acute maxillary sinusitis, unspecified: Secondary | ICD-10-CM | POA: Diagnosis not present

## 2014-12-27 MED ORDER — AMOXICILLIN-POT CLAVULANATE 875-125 MG PO TABS
1.0000 | ORAL_TABLET | Freq: Two times a day (BID) | ORAL | Status: DC
Start: 2014-12-27 — End: 2015-02-21

## 2014-12-27 NOTE — Progress Notes (Signed)
Name: Bonnie Rowland   MRN: [id number]    DOB: 05/26/1941   Date:12/27/2014       Progress Note  Subjective  Chief Complaint  Chief Complaint  Patient presents with  . Acute Visit    fever, headache    Sinusitis This is a new problem. The current episode started yesterday. The problem is unchanged. The maximum temperature recorded prior to her arrival was 101 - 101.9 F (101F this AM). The pain is mild. Associated symptoms include chills, congestion, coughing, headaches, sinus pressure and a sore throat. Pertinent negatives include no ear pain. Past treatments include nothing.      Past Medical History  Diagnosis Date  . Hyperlipidemia   . Hypertension   . Renal artery stenosis   . Sprain of lumbar region   . Thoracic or lumbosacral neuritis or radiculitis, unspecified   . Allergic rhinitis, cause unspecified   . Diabetes mellitus     type II  . 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 Dermatophytosis of nail   . Esophageal reflux   . Atherosclerosis of renal artery   . Cellulitis and abscess of leg, except foot   . Urinary tract infection, site not specified   . Acute cystitis   . Conjunctivitis unspecified   . Bronchitis, not specified as acute or chronic   . 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 Cardiac catheterization    . Appendectomy    . Colonoscopy    . Diagnostic mammogram    . Cataract surgery      Family History  Problem Relation Age of Onset  . Diabetes Mother   . Hypertension Mother   . Cancer Mother   . Hypertension Father   . Cancer Father     History   Social History  . Marital Status: Married    Spouse Name: N/A  . Number of Children: N/A  . Years of Education: N/A   Occupational History  . Not on file.   Social History Main Topics  . Smoking status: Never Smoker   . Smokeless tobacco: Never Used  . Alcohol  Use: No     Comment: occasional  . Drug Use: No  . Sexual Activity: Not on file   Other Topics Concern  . Not on file   Social History Narrative     Current outpatient prescriptions:  .  aspirin 81 MG EC tablet, Take 81 mg by mouth daily.  , Disp: , Rfl:  .  B Complex-C (B-COMPLEX WITH VITAMIN C) tablet, Take 2 tablets by mouth at bedtime. , Disp: , Rfl:  .  BD PEN NEEDLE NANO U/F 32G X 4 MM MISC, USE 2 TIMES DAILY, Disp: 180 each, Rfl: 0 .  cholecalciferol (VITAMIN D) 1000 UNITS tablet, Take 4,000 Units by mouth daily. , Disp: , Rfl:  .  Coenzyme Q10 (COQ-10) 100 MG CAPS, Take 1 capsule by mouth at bedtime. , Disp: , Rfl:  .  Coenzyme Q10 (COQ10) 200 MG CAPS, Take 1 tablet by mouth daily., Disp: , Rfl:  .  estradiol (ESTRACE VAGINAL) 0.1 MG/GM vaginal cream, 1 application daily., Disp: , Rfl:  .  fish oil-omega-3 fatty acids 1000 MG capsule, Take 2 g by mouth daily.  , Disp: , Rfl:  .  GARLIC 4098 PO, Take 1 tablet by mouth daily.  ,  Disp: , Rfl:  .  Glucosamine 500 MG TABS, Take 1 tablet by mouth daily.  , Disp: , Rfl:  .  Hydrocodone-Acetaminophen 10-300 MG TABS, Take 1 tablet by mouth every 6 (six) hours as needed., Disp: , Rfl:  .  LANTUS SOLOSTAR 100 UNIT/ML Solostar Pen, INJECT SUBCUTANEOUSLY 25 UNITS AT BEDTIME, Disp: 15 mL, Rfl: 11 .  Liraglutide (VICTOZA) 18 MG/3ML SOLN, Inject 1.2 mg into the skin daily. , Disp: , Rfl:  .  losartan-hydrochlorothiazide (HYZAAR) 100-25 MG per tablet, Take 1 tablet by mouth daily., Disp: , Rfl:  .  metaxalone (SKELAXIN) 800 MG tablet, Take 800 mg by mouth daily., Disp: , Rfl:  .  metformin (FORTAMET) 500 MG (OSM) 24 hr tablet, Take 1,000 mg by mouth 2 (two) times daily with a meal. , Disp: , Rfl:  .  Multiple Vitamin (MULTIVITAMIN) tablet, Take 1 tablet by mouth daily. , Disp: , Rfl:  .  omeprazole (PRILOSEC) 20 MG capsule, Take 20 mg by mouth every other day. , Disp: , Rfl:  .  ONE TOUCH ULTRA TEST test strip, TEST 2 TIMES DAILY, Disp: 180  each, Rfl: 3 .  raloxifene (EVISTA) 60 MG tablet, TAKE 1 BY MOUTH EVERY MORNING, Disp: 90 tablet, Rfl: 0 .  rosuvastatin (CRESTOR) 10 MG tablet, Take 10 mg by mouth every other day. , Disp: , Rfl:  .  amoxicillin-clavulanate (AUGMENTIN) 875-125 MG per tablet, Take 1 tablet by mouth 2 (two) times daily., Disp: 20 tablet, Rfl: 0 .  cefUROXime (CEFTIN) 250 MG tablet, Take 1 tablet (250 mg total) by mouth 2 (two) times daily with a meal. (Patient not taking: Reported on 12/27/2014), Disp: 14 tablet, Rfl: 0 .  insulin glargine (LANTUS) 100 UNIT/ML injection, Inject 26 Units into the skin at bedtime. , Disp: , Rfl:   Allergies  Allergen Reactions  . Cheese Other (See Comments)    bloating     Review of Systems  Constitutional: Positive for chills.  HENT: Positive for congestion, sinus pressure and sore throat. Negative for ear pain.   Respiratory: Positive for cough.   Neurological: Positive for headaches.      Objective  Filed Vitals:   12/27/14 1141  BP: 138/60  Pulse: 82  Temp: 99.8 F (37.7 C)  TempSrc: Oral  Height: 5\' 4"  (1.626 m)  Weight: 200 lb 6.4 oz (90.901 kg)  SpO2: 94%    Physical Exam  Constitutional: She is oriented to person, place, and time and well-developed, well-nourished, and in no distress. Vital signs are normal.  HENT:  Head: Normocephalic and atraumatic.  Right Ear: There is tenderness.  Left Ear: External ear normal.  Nose: Mucosal edema present. Right sinus exhibits maxillary sinus tenderness and frontal sinus tenderness. Left sinus exhibits maxillary sinus tenderness and frontal sinus tenderness.  Eyes: Pupils are equal, round, and reactive to light.  Neck: Normal range of motion.  Cardiovascular: Normal rate and regular rhythm.   Pulmonary/Chest: Effort normal and breath sounds normal.  Neurological: She is oriented to person, place, and time.  Nursing note and vitals reviewed.    Assessment & Plan 1. Acute maxillary sinusitis, recurrence  not specified  - amoxicillin-clavulanate (AUGMENTIN) 875-125 MG per tablet; Take 1 tablet by mouth 2 (two) times daily.  Dispense: 20 tablet; Refill: 0    Darris Staiger [facility] 12/27/2014 6:27 PM

## 2014-12-31 ENCOUNTER — Encounter: Payer: Self-pay | Admitting: Podiatry

## 2014-12-31 ENCOUNTER — Ambulatory Visit (INDEPENDENT_AMBULATORY_CARE_PROVIDER_SITE_OTHER): Payer: BLUE CROSS/BLUE SHIELD | Admitting: Podiatry

## 2014-12-31 DIAGNOSIS — Q828 Other specified congenital malformations of skin: Secondary | ICD-10-CM | POA: Diagnosis not present

## 2014-12-31 DIAGNOSIS — M779 Enthesopathy, unspecified: Secondary | ICD-10-CM

## 2014-12-31 MED ORDER — TRIAMCINOLONE ACETONIDE 10 MG/ML IJ SUSP
10.0000 mg | Freq: Once | INTRAMUSCULAR | Status: AC
  Administered 2014-12-31: 10 mg

## 2015-01-02 NOTE — Progress Notes (Signed)
Subjective:     Patient ID: Bonnie Rowland, female   DOB: [birth year], 74 y.o.   MRN: [id number]  HPI patient presents with lesions on both feet with a 1 in the right foot been at the base of the fifth metatarsal with fluid buildup around the joint that's painful. She develops these periodically   Review of Systems     Objective:   Physical Exam Neurovascular status intact no change in health history with lesion on the plantar right foot base of fifth metatarsal with fluid buildup occurring around it and pain when palpated    Assessment:     Inflammatory capsulitis base of fifth metatarsal right along with lesion formation    Plan:     Careful injection administered 2 mg dexamethasone Kenalog 3 mg Xylocaine and debridement of lesions accomplished with no iatrogenic bleeding noted patient will be seen back to recheck

## 2015-01-10 ENCOUNTER — Encounter (INDEPENDENT_AMBULATORY_CARE_PROVIDER_SITE_OTHER): Payer: Self-pay

## 2015-01-10 ENCOUNTER — Encounter: Payer: Self-pay | Admitting: Family Medicine

## 2015-01-10 ENCOUNTER — Ambulatory Visit (INDEPENDENT_AMBULATORY_CARE_PROVIDER_SITE_OTHER): Payer: BLUE CROSS/BLUE SHIELD | Admitting: Family Medicine

## 2015-01-10 ENCOUNTER — Other Ambulatory Visit: Payer: Self-pay

## 2015-01-10 VITALS — BP 128/78 | HR 75 | Temp 98.6°F | Resp 16 | Ht 64.0 in | Wt 192.1 lb

## 2015-01-10 DIAGNOSIS — E669 Obesity, unspecified: Secondary | ICD-10-CM | POA: Diagnosis not present

## 2015-01-10 DIAGNOSIS — E1165 Type 2 diabetes mellitus with hyperglycemia: Secondary | ICD-10-CM | POA: Diagnosis not present

## 2015-01-10 DIAGNOSIS — IMO0002 Reserved for concepts with insufficient information to code with codable children: Secondary | ICD-10-CM | POA: Insufficient documentation

## 2015-01-10 DIAGNOSIS — E119 Type 2 diabetes mellitus without complications: Secondary | ICD-10-CM | POA: Diagnosis not present

## 2015-01-10 DIAGNOSIS — E785 Hyperlipidemia, unspecified: Secondary | ICD-10-CM | POA: Diagnosis not present

## 2015-01-10 DIAGNOSIS — M4806 Spinal stenosis, lumbar region: Secondary | ICD-10-CM

## 2015-01-10 DIAGNOSIS — Z85820 Personal history of malignant melanoma of skin: Secondary | ICD-10-CM | POA: Insufficient documentation

## 2015-01-10 DIAGNOSIS — G8929 Other chronic pain: Secondary | ICD-10-CM | POA: Insufficient documentation

## 2015-01-10 DIAGNOSIS — R5383 Other fatigue: Secondary | ICD-10-CM | POA: Insufficient documentation

## 2015-01-10 DIAGNOSIS — D4989 Neoplasm of unspecified behavior of other specified sites: Secondary | ICD-10-CM | POA: Insufficient documentation

## 2015-01-10 DIAGNOSIS — M170 Bilateral primary osteoarthritis of knee: Secondary | ICD-10-CM | POA: Diagnosis not present

## 2015-01-10 DIAGNOSIS — M48062 Spinal stenosis, lumbar region with neurogenic claudication: Secondary | ICD-10-CM

## 2015-01-10 DIAGNOSIS — Z9889 Other specified postprocedural states: Secondary | ICD-10-CM | POA: Insufficient documentation

## 2015-01-10 DIAGNOSIS — E113299 Type 2 diabetes mellitus with mild nonproliferative diabetic retinopathy without macular edema, unspecified eye: Secondary | ICD-10-CM | POA: Insufficient documentation

## 2015-01-10 DIAGNOSIS — M545 Low back pain, unspecified: Secondary | ICD-10-CM | POA: Insufficient documentation

## 2015-01-10 LAB — POCT GLYCOSYLATED HEMOGLOBIN (HGB A1C): Hemoglobin A1C: 8.7

## 2015-01-10 LAB — GLUCOSE, POCT (MANUAL RESULT ENTRY): POC GLUCOSE: 87 mg/dL (ref 70–99)

## 2015-01-10 MED ORDER — HYDROCODONE-ACETAMINOPHEN 10-300 MG PO TABS: 1.0000 | ORAL_TABLET | Freq: Three times a day (TID) | ORAL | Status: DC | PRN

## 2015-01-10 MED ORDER — INSULIN GLARGINE 100 UNIT/ML SOLOSTAR PEN: 26.0000 [IU] | PEN_INJECTOR | Freq: Every day | SUBCUTANEOUS | Status: DC

## 2015-01-10 MED ORDER — METAXALONE 800 MG PO TABS: 800.0000 mg | ORAL_TABLET | Freq: Three times a day (TID) | ORAL | Status: DC

## 2015-01-10 NOTE — Progress Notes (Signed)
Name: Bonnie Rowland   MRN: [id number]    DOB: 02/25/1941   Date:01/10/2015       Progress Note  Subjective  Chief Complaint  Chief Complaint  Patient presents with  . Diabetes    pt here for DM follow up  . Knee Pain    Surgery scheduled for 02/26/15 for rt knee    Diabetes She presents for her follow-up diabetic visit. She has type 2 diabetes mellitus. Her disease course has been worsening. There are no hypoglycemic associated symptoms. Pertinent negatives for hypoglycemia include no dizziness, headaches, nervousness/anxiousness, seizures or tremors. Associated symptoms include foot paresthesias. Pertinent negatives for diabetes include no blurred vision, no chest pain, no weakness and no weight loss. There are no hypoglycemic complications. Diabetic complications include peripheral neuropathy. Risk factors for coronary artery disease include diabetes mellitus, dyslipidemia, hypertension, obesity, post-menopausal, sedentary lifestyle and stress. She is currently taking insulin at bedtime. Her weight is decreasing steadily. She is following a diabetic diet. She has had a previous visit with a dietitian. She rarely participates in exercise. Her overall blood glucose range is 110-130 mg/dl. An ACE inhibitor/angiotensin II receptor blocker is being taken. She does not see a podiatrist.Eye exam is current.  Knee Pain  There was no injury mechanism. The pain is present in the left knee and right knee. The quality of the pain is described as aching and shooting. The pain is severe. The pain has been worsening since onset. Pertinent negatives include no tingling. She reports no foreign bodies present. The symptoms are aggravated by movement and weight bearing. She has tried NSAIDs (Narcotic pain med and injections by her orthopedist) for the symptoms. The treatment provided no relief.  Back Pain This is a chronic problem. The current episode started more than 1 year ago. The problem occurs constantly.  The problem is unchanged. The pain is present in the lumbar spine. The quality of the pain is described as aching and stabbing. The pain is moderate. The pain is the same all the time. The symptoms are aggravated by bending, coughing, stress, twisting and position. Stiffness is present all day. Pertinent negatives include no bladder incontinence, chest pain, dysuria, fever, headaches, tingling, weakness or weight loss. Risk factors include poor posture, obesity and history of steroid use. She has tried analgesics, chiropractic manipulation, home exercises, muscle relaxant and NSAIDs for the symptoms. The treatment provided mild relief.      Past Medical History  Diagnosis Date  . Hyperlipidemia   . Hypertension   . Renal artery stenosis   . Sprain of lumbar region   . Thoracic or lumbosacral neuritis or radiculitis, unspecified   . Allergic rhinitis, cause unspecified   . Diabetes mellitus     type II  . 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 Dermatophytosis of nail   . Esophageal reflux   . Atherosclerosis of renal artery   . Cellulitis and abscess of leg, except foot   . Urinary tract infection, site not specified   . Acute cystitis   . Conjunctivitis unspecified   . Bronchitis, not specified as acute or chronic   . 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 Smoking status: Never Smoker   . Smokeless tobacco: Never Used  . Alcohol Use: No     Comment: occasional     Current outpatient prescriptions:  .  amoxicillin-clavulanate (AUGMENTIN) 875-125 MG per tablet, Take 1 tablet by mouth 2 (two) times daily., Disp: 20 tablet, Rfl: 0 .  aspirin 81 MG EC tablet, Take 81 mg by mouth daily.  , Disp: , Rfl:  .  B Complex-C (B-COMPLEX WITH VITAMIN C) tablet, Take 2 tablets by mouth at bedtime. , Disp: , Rfl:  .  BD PEN NEEDLE NANO U/F 32G X 4 MM MISC, USE 2 TIMES DAILY, Disp: 180 each,  Rfl: 0 .  cefUROXime (CEFTIN) 250 MG tablet, Take 1 tablet (250 mg total) by mouth 2 (two) times daily with a meal., Disp: 14 tablet, Rfl: 0 .  cholecalciferol (VITAMIN D) 1000 UNITS tablet, Take 4,000 Units by mouth daily. , Disp: , Rfl:  .  Coenzyme Q10 (COQ-10) 100 MG CAPS, Take 1 capsule by mouth at bedtime. , Disp: , Rfl:  .  Coenzyme Q10 (COQ10) 200 MG CAPS, Take 1 tablet by mouth daily., Disp: , Rfl:  .  estradiol (ESTRACE VAGINAL) 0.1 MG/GM vaginal cream, 1 application daily., Disp: , Rfl:  .  fish oil-omega-3 fatty acids 1000 MG capsule, Take 2 g by mouth daily.  , Disp: , Rfl:  .  GARLIC 2355 PO, Take 1 tablet by mouth daily.  , Disp: , Rfl:  .  Glucosamine 500 MG TABS, Take 1 tablet by mouth daily.  , Disp: , Rfl:  .  Hydrocodone-Acetaminophen 10-300 MG TABS, Take 1 tablet by mouth every 6 (six) hours as needed., Disp: , Rfl:  .  insulin glargine (LANTUS) 100 UNIT/ML injection, Inject 26 Units into the skin at bedtime. , Disp: , Rfl:  .  LANTUS SOLOSTAR 100 UNIT/ML Solostar Pen, INJECT SUBCUTANEOUSLY 25 UNITS AT BEDTIME, Disp: 15 mL, Rfl: 11 .  Liraglutide (VICTOZA) 18 MG/3ML SOLN, Inject 1.2 mg into the skin daily. , Disp: , Rfl:  .  losartan-hydrochlorothiazide (HYZAAR) 100-25 MG per tablet, Take 1 tablet by mouth daily., Disp: , Rfl:  .  metaxalone (SKELAXIN) 800 MG tablet, Take 800 mg by mouth daily., Disp: , Rfl:  .  metformin (FORTAMET) 500 MG (OSM) 24 hr tablet, Take 1,000 mg by mouth 2 (two) times daily with a meal. , Disp: , Rfl:  .  Multiple Vitamin (MULTIVITAMIN) tablet, Take 1 tablet by mouth daily. , Disp: , Rfl:  .  omeprazole (PRILOSEC) 20 MG capsule, Take 20 mg by mouth every other day. , Disp: , Rfl:  .  ONE TOUCH ULTRA TEST test strip, TEST 2 TIMES DAILY, Disp: 180 each, Rfl: 3 .  raloxifene (EVISTA) 60 MG tablet, TAKE 1 BY MOUTH EVERY MORNING, Disp: 90 tablet, Rfl: 0 .  rosuvastatin (CRESTOR) 10 MG tablet, Take 10 mg by mouth every other day. , Disp: , Rfl:    Allergies  Allergen Reactions  . Cheese Other (See Comments)    bloating    Review of Systems  Constitutional: Negative for fever, chills and weight loss.       Morbidly obese  HENT: Negative for congestion, hearing loss, sore throat and tinnitus.   Eyes: Negative for blurred vision, double vision and redness.  Respiratory: Negative for cough, hemoptysis and shortness of breath.        Few expiratory rhonchi  Cardiovascular: Positive for leg swelling. Negative for chest pain, palpitations, orthopnea and claudication.  Gastrointestinal: Negative for heartburn, nausea, vomiting, diarrhea, constipation and blood in stool.  Genitourinary: Negative for bladder incontinence, dysuria, urgency, frequency and hematuria.  Musculoskeletal: Positive for back pain and joint pain.  Negative for myalgias, falls and neck pain.  Skin: Negative for itching.  Neurological: Positive for sensory change. Negative for dizziness, tingling, tremors, focal weakness, seizures, loss of consciousness, weakness and headaches.  Endo/Heme/Allergies: Does not bruise/bleed easily.  Psychiatric/Behavioral: Negative for depression and substance abuse. The patient is not nervous/anxious and does not have insomnia.      Objective  Filed Vitals:   01/10/15 0830  BP: 128/78  Pulse: 75  Temp: 98.6 F (37 C)  Resp: 16  Height: 5\' 4"  (1.626 m)  Weight: 192 lb 1 oz (87.119 kg)  SpO2: 93%     Physical Exam  Constitutional: She is oriented to person, place, and time and well-developed, well-nourished, and in no distress.  Morbidly obese  HENT:  Head: Normocephalic.  Eyes: EOM are normal. Pupils are equal, round, and reactive to light.  Neck: Normal range of motion. No thyromegaly present.  Cardiovascular: Normal rate, regular rhythm and normal heart sounds.   No murmur heard. Pulmonary/Chest: Effort normal and breath sounds normal.  Abdominal: Soft. Bowel sounds are normal.  Musculoskeletal: Normal range of  motion. She exhibits edema and tenderness.  Neurological: She is alert and oriented to person, place, and time. No cranial nerve deficit. Gait normal.  Skin: Skin is warm and dry. No rash noted.  Psychiatric: Memory and affect normal.      Assessment & Plan  1. Type 2 diabetes mellitus without complication  - POCT Glucose (CBG) - POCT HgB A1C  2. Chronic pain  - Hydrocodone-Acetaminophen 10-300 MG TABS; Take 1 tablet by mouth every 8 (eight) hours as needed.  Dispense: 270 each; Refill: 0  3. Diabetes type 2, uncontrolled Recent steroid injections have exacerbated her diabetes ure  4. Primary osteoarthritis of both knees she is anticipating knee replacement in the fut  5. Hyperlipemia Stable  6. Spinal stenosis, lumbar region, with neurogenic claudication Per Dr. Sharlet Salina - metaxalone (SKELAXIN) 800 MG tablet; Take 1 tablet (800 mg total) by mouth 3 (three) times daily.  Dispense: 270 tablet; Refill: 0  7. Adiposity Diet and exercise encouraged

## 2015-01-10 NOTE — Patient Instructions (Signed)
Per orthopedist and pain management

## 2015-01-13 ENCOUNTER — Ambulatory Visit: Payer: BLUE CROSS/BLUE SHIELD | Attending: Pain Medicine | Admitting: Pain Medicine

## 2015-01-13 ENCOUNTER — Encounter: Payer: Self-pay | Admitting: Pain Medicine

## 2015-01-13 VITALS — BP 133/58 | HR 83 | Temp 98.0°F | Resp 16 | Ht 64.0 in | Wt 193.0 lb

## 2015-01-13 DIAGNOSIS — M48062 Spinal stenosis, lumbar region with neurogenic claudication: Secondary | ICD-10-CM

## 2015-01-13 DIAGNOSIS — M47816 Spondylosis without myelopathy or radiculopathy, lumbar region: Secondary | ICD-10-CM | POA: Diagnosis not present

## 2015-01-13 DIAGNOSIS — M7061 Trochanteric bursitis, right hip: Secondary | ICD-10-CM

## 2015-01-13 DIAGNOSIS — M533 Sacrococcygeal disorders, not elsewhere classified: Secondary | ICD-10-CM

## 2015-01-13 DIAGNOSIS — M79604 Pain in right leg: Secondary | ICD-10-CM | POA: Diagnosis present

## 2015-01-13 DIAGNOSIS — M79605 Pain in left leg: Secondary | ICD-10-CM | POA: Diagnosis present

## 2015-01-13 DIAGNOSIS — E1340 Other specified diabetes mellitus with diabetic neuropathy, unspecified: Secondary | ICD-10-CM

## 2015-01-13 DIAGNOSIS — M5136 Other intervertebral disc degeneration, lumbar region: Secondary | ICD-10-CM | POA: Insufficient documentation

## 2015-01-13 DIAGNOSIS — E1140 Type 2 diabetes mellitus with diabetic neuropathy, unspecified: Secondary | ICD-10-CM | POA: Insufficient documentation

## 2015-01-13 DIAGNOSIS — M545 Low back pain: Secondary | ICD-10-CM | POA: Diagnosis present

## 2015-01-13 DIAGNOSIS — M4806 Spinal stenosis, lumbar region: Secondary | ICD-10-CM | POA: Insufficient documentation

## 2015-01-13 DIAGNOSIS — M7062 Trochanteric bursitis, left hip: Secondary | ICD-10-CM

## 2015-01-13 DIAGNOSIS — G5700 Lesion of sciatic nerve, unspecified lower limb: Secondary | ICD-10-CM

## 2015-01-13 NOTE — Progress Notes (Signed)
Safety precautions to be maintained throughout the outpatient stay will include: orient to surroundings, keep bed in low position, maintain call bell within reach at all times, provide assistance with transfer out of bed and ambulation.  Patient will call for appointment. No meds  Ordered Discharge patient home ambulatory 1131hrs

## 2015-01-13 NOTE — Progress Notes (Signed)
   Subjective:    Patient ID: Bonnie Rowland, female    DOB: January 12, 1941, 74 y.o.   MRN: [id number]  HPI  Bonnie Rowland patient is 74 year old female returns to [place] for further evaluation and treatment of pain involving the lumbar lower extremity region. Patient states that she has tremendous relief of pain at this time and that she is doing extremely well. Patient states that interventional treatment lumbar epidural steroid injection relieved her lower back and lower extremity pain. At the present time patient will follow-up Dr. Marry Guan regarding her knee. Patient states that she will undergo a total knee replacement and a few weeks. We will continue training you to observe patient's response to treatment and will consider modification of treatment should there be significant change in condition. Patient will follow-up Dr. Rutherford Nail for evaluation of blood pressure diabetes mellitus and general medical condition and will follow-up Dr. Marry Guan consider surgical intervention of total knee replacement. The patient was understanding and agreement status treatment plan      Review of Systems     Objective:   Physical Exam  there was mild tenderness of the spleen scapula and occipitalis musculature region. Palpation cervical facet cervical paraspinal muscle treatment and thoracic facet thoracic paraspinal musculature region was without increased pain of significant degree. There was unremarkable Spurling's maneuver. Tinel and Phalen's maneuver were without increase of pain of significant degree. Patient was with bilaterally equal grip strength. Palpation over the lower thoracic facet thoracic paraspinal musculature region was with minimal tenderness to palpation. Palpation over the lumbar paraspinal musculature region lumbar facet region was a tends to palpation of mild to moderate degree. Extension and palpation lumbar facets reproduce mild to moderate discomfort. Palpation of the  PSIS and PII S region was with minimal discomfort. Minimal tenderness of the greater trochanteric region iliotibial band region. Straight leg raising tolerates approximately 20 without a definite increased pain with dorsiflexion noted. Slightly decreased EHL strength was noted. Knees were tends to palpation with negative anterior posterior drawer signs. No ballottement of the patella was noted. Deficit of the knee without increased pain with range of motion maneuvers of the knee. There was negative clonus negative Homans. Abdomen nontender and no costovertebral maintenance noted.       Assessment & Plan:   degenerative disc disease lumbar spine  Multilevel degenerative changes of the lumbar spine pain a disc bulging T12-L1, central to right paracentral disc protrusion versus osteophyte formation  Multilevel degenerative disc disease noted throughout the lumbar spine   Lumbar stenosis   Lumbar facet syndrome   Diabetic neuropathy   History of sciatica    Plan   Continue present medications  F/U PCP Dr. Lucita Lora for evaliation of  BP and general medical  condition.  F/U surgical evaluation . Patient will follow-up with Dr.Hooten to consider surgical intervention of the knee is plan  F/U neurological evaluation  May consider radiofrequency rhizolysis or intraspinal procedures pending response to present treatment and F/U evaluation.  [facility] to call [facility] should patient have concerns prior to scheduled return appointment.

## 2015-01-13 NOTE — Patient Instructions (Signed)
Continue present medications  F/U PCP Dr. Lucita Lora for evaliation of  BP and general medical  condition.  F/U surgical evaluation. Follow-up Dr. Marry Guan as discussed for further evaluation and treatment of knee  F/U neurological evaluation  May consider radiofrequency rhizolysis or intraspinal procedures pending response to present treatment and F/U evaluation.  Patient to call [facility] should patient have concerns prior to scheduled return appointment.

## 2015-01-17 ENCOUNTER — Telehealth: Payer: Self-pay | Admitting: Family Medicine

## 2015-01-17 NOTE — Telephone Encounter (Signed)
Pt would like a call back to get clarification on her meds.

## 2015-01-20 ENCOUNTER — Telehealth: Payer: Self-pay | Admitting: Emergency Medicine

## 2015-01-20 MED ORDER — INSULIN GLARGINE 100 UNIT/ML SOLOSTAR PEN: 26.0000 [IU] | PEN_INJECTOR | Freq: Every day | SUBCUTANEOUS | Status: DC

## 2015-01-20 NOTE — Telephone Encounter (Signed)
Spoke to patient. SHe only received 1 box of Lantus with mail order. When she normally gets 2 boxes. New script sent to [facility]

## 2015-01-24 ENCOUNTER — Other Ambulatory Visit (INDEPENDENT_AMBULATORY_CARE_PROVIDER_SITE_OTHER): Payer: BLUE CROSS/BLUE SHIELD

## 2015-01-24 DIAGNOSIS — Z1211 Encounter for screening for malignant neoplasm of colon: Secondary | ICD-10-CM

## 2015-01-24 DIAGNOSIS — Z1212 Encounter for screening for malignant neoplasm of rectum: Secondary | ICD-10-CM | POA: Diagnosis not present

## 2015-01-24 LAB — POC HEMOCCULT BLD/STL (HOME/3-CARD/SCREEN)
Card #3 Fecal Occult Blood, POC: NEGATIVE
FECAL OCCULT BLD: POSITIVE
Fecal Occult Blood, POC: POSITIVE — AB

## 2015-02-07 ENCOUNTER — Other Ambulatory Visit: Payer: Self-pay | Admitting: Family Medicine

## 2015-02-08 ENCOUNTER — Encounter
Admission: RE | Admit: 2015-02-08 | Discharge: 2015-02-08 | Disposition: A | Discharge status: Home / Self Care | Payer: BLUE CROSS/BLUE SHIELD | Source: Ambulatory Visit | Attending: Orthopedic Surgery | Admitting: Orthopedic Surgery

## 2015-02-08 DIAGNOSIS — Z01810 Encounter for preprocedural cardiovascular examination: Secondary | ICD-10-CM | POA: Diagnosis present

## 2015-02-08 DIAGNOSIS — Z01812 Encounter for preprocedural laboratory examination: Secondary | ICD-10-CM | POA: Diagnosis present

## 2015-02-08 DIAGNOSIS — I10 Essential (primary) hypertension: Secondary | ICD-10-CM

## 2015-02-08 LAB — TYPE AND SCREEN
ABO/RH(D): O POS
Antibody Screen: NEGATIVE

## 2015-02-08 LAB — URINALYSIS COMPLETE WITH MICROSCOPIC (ARMC ONLY)
BILIRUBIN URINE: NEGATIVE
Bacteria, UA: NONE SEEN
Glucose, UA: NEGATIVE mg/dL
Hgb urine dipstick: NEGATIVE
Ketones, ur: NEGATIVE mg/dL
Leukocytes, UA: NEGATIVE
Nitrite: NEGATIVE
PROTEIN: NEGATIVE mg/dL
SPECIFIC GRAVITY, URINE: 1.013 (ref 1.005–1.030)
pH: 7 (ref 5.0–8.0)

## 2015-02-08 LAB — BASIC METABOLIC PANEL
Anion gap: 9 (ref 5–15)
BUN: 9 mg/dL (ref 6–20)
CALCIUM: 9.3 mg/dL (ref 8.9–10.3)
CO2: 31 mmol/L (ref 22–32)
CREATININE: 0.54 mg/dL (ref 0.44–1.00)
Chloride: 99 mmol/L — ABNORMAL LOW (ref 101–111)
GFR calc Af Amer: >60 mL/min (ref >60)
GFR calc non Af Amer: >60 mL/min (ref >60)
Glucose, Bld: 116 mg/dL — ABNORMAL HIGH (ref 65–99)
POTASSIUM: 4 mmol/L (ref 3.5–5.1)
Sodium: 139 mmol/L (ref 135–145)

## 2015-02-08 LAB — CBC
HCT: 40 % (ref 35.0–47.0)
HEMOGLOBIN: 13.2 g/dL (ref 12.0–16.0)
MCH: 30.9 pg (ref 26.0–34.0)
MCHC: 32.9 g/dL (ref 32.0–36.0)
MCV: 93.9 fL (ref 80.0–100.0)
Platelets: 244 10*3/uL (ref 150–440)
RBC: 4.26 MIL/uL (ref 3.80–5.20)
RDW: 13.8 % (ref 11.5–14.5)
WBC: 8 10*3/uL (ref 3.6–11.0)

## 2015-02-08 LAB — SURGICAL PCR SCREEN
MRSA, PCR: NEGATIVE
Staphylococcus aureus: NEGATIVE

## 2015-02-08 LAB — SEDIMENTATION RATE: Sed Rate: 9 mm/hr (ref 0–30)

## 2015-02-08 LAB — PROTIME-INR
INR: 1.01
Prothrombin Time: 13.5 seconds (ref 11.4–15.0)

## 2015-02-08 LAB — APTT: APTT: 37 s — AB (ref 24–36)

## 2015-02-08 LAB — HEMOGLOBIN A1C: HEMOGLOBIN A1C: 7.4 % — AB (ref 4.0–6.0)

## 2015-02-08 LAB — ABO/RH: ABO/RH(D): O POS

## 2015-02-08 NOTE — Patient Instructions (Signed)
  Your procedure is scheduled on: February 21, 2015 (Monday) Report to Day Surgery. To find out your arrival time please call [phone] between 1PM - 3PM on February 18, 2015 (Friday).  Remember: Instructions that are not followed completely may result in serious medical risk, up to and including death, or upon the discretion of your surgeon and anesthesiologist your surgery may need to be rescheduled.    __x__ 1. Do not eat food or drink liquids after midnight. No gum chewing or hard candies.     ____ 2. No Alcohol for 24 hours before or after surgery.   ____ 3. Bring all medications with you on the day of surgery if instructed.    __x__ 4. Notify your doctor if there is any change in your medical condition     (cold, fever, infections).     Do not wear jewelry, make-up, hairpins, clips or nail polish.  Do not wear lotions, powders, or perfumes. You may wear deodorant.  Do not shave 48 hours prior to surgery. Men may shave face and neck.  Do not bring valuables to the hospital.    [facility] is not responsible for any belongings or valuables.               Contacts, dentures or bridgework may not be worn into surgery.  Leave your suitcase in the car. After surgery it may be brought to your room.  For patients admitted to the hospital, discharge time is determined by your                treatment team.   Patients discharged the day of surgery will not be allowed to drive home.   Please read over the following fact sheets that you were given:   MRSA Information and Surgical Site Infection Prevention   __x__ Take these medicines the morning of surgery with A SIP OF WATER:    1. Omeprazole (Omeprazole at bedtime on  August 21, --Sunday night)  2.   3.   4.  5.  6.  ____ Fleet Enema (as directed)   __x__ Use CHG Soap as directed  ____ Use inhalers on the day of surgery  _x___ Stop metformin 2 days prior to surgery (STOP METFORMIN ON AUGUST 20)    __x__ Take 1/2 of usual  insulin dose the night before surgery and none on the morning of surgery. (TAKE ONE-HALF OF LANTUS AT BEDTIME ON AUGUST 21)  __x__ Stop Coumadin/Plavix/aspirin on (ASK DR HOOTEN WHEN TO STOP ASPIRIN)  ____ Stop Anti-inflammatories on    __x__ Stop supplements until after surgery.  (STOP ALL SUPPLEMENTS NOW)  ____ Bring C-Pap to the hospital.

## 2015-02-09 ENCOUNTER — Other Ambulatory Visit: Payer: BLUE CROSS/BLUE SHIELD

## 2015-02-09 LAB — URINE CULTURE

## 2015-02-11 ENCOUNTER — Telehealth: Payer: Self-pay | Admitting: Emergency Medicine

## 2015-02-11 NOTE — Telephone Encounter (Signed)
Patient notified of hemocult results. 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 Will repeat test after surgery. Schedule for knee surgery on 8/15

## 2015-02-18 ENCOUNTER — Other Ambulatory Visit: Payer: Self-pay | Admitting: Family Medicine

## 2015-02-21 ENCOUNTER — Encounter: Payer: Self-pay | Admitting: Certified Registered"

## 2015-02-21 ENCOUNTER — Ambulatory Visit
Admission: RE | Admit: 2015-02-21 | Discharge: 2015-02-21 | Disposition: A | Discharge status: Home / Self Care | Payer: BLUE CROSS/BLUE SHIELD | Source: Ambulatory Visit | Attending: Orthopedic Surgery | Admitting: Orthopedic Surgery

## 2015-02-21 ENCOUNTER — Encounter
Admission: RE | Disposition: A | Discharge status: Still In Hospital | Payer: Self-pay | Source: Ambulatory Visit | Attending: Orthopedic Surgery

## 2015-02-21 ENCOUNTER — Emergency Department
Admission: EM | Admit: 2015-02-21 | Discharge: 2015-02-21 | Disposition: A | Discharge status: Home / Self Care | Payer: BLUE CROSS/BLUE SHIELD | Source: Home / Self Care | Attending: Emergency Medicine | Admitting: Emergency Medicine

## 2015-02-21 ENCOUNTER — Emergency Department: Payer: BLUE CROSS/BLUE SHIELD

## 2015-02-21 ENCOUNTER — Encounter: Payer: Self-pay | Admitting: *Deleted

## 2015-02-21 ENCOUNTER — Other Ambulatory Visit: Payer: Self-pay

## 2015-02-21 DIAGNOSIS — R000 Tachycardia, unspecified: Secondary | ICD-10-CM | POA: Diagnosis present

## 2015-02-21 DIAGNOSIS — I483 Typical atrial flutter: Secondary | ICD-10-CM

## 2015-02-21 DIAGNOSIS — Z538 Procedure and treatment not carried out for other reasons: Secondary | ICD-10-CM | POA: Insufficient documentation

## 2015-02-21 LAB — CBC
HCT: 39.6 % (ref 35.0–47.0)
Hemoglobin: 13.3 g/dL (ref 12.0–16.0)
MCH: 31.4 pg (ref 26.0–34.0)
MCHC: 33.5 g/dL (ref 32.0–36.0)
MCV: 93.8 fL (ref 80.0–100.0)
PLATELETS: 227 10*3/uL (ref 150–440)
RBC: 4.22 MIL/uL (ref 3.80–5.20)
RDW: 13.7 % (ref 11.5–14.5)
WBC: 9.4 10*3/uL (ref 3.6–11.0)

## 2015-02-21 LAB — BASIC METABOLIC PANEL
Anion gap: 10 (ref 5–15)
BUN: 8 mg/dL (ref 6–20)
CALCIUM: 8.8 mg/dL — AB (ref 8.9–10.3)
CHLORIDE: 105 mmol/L (ref 101–111)
CO2: 28 mmol/L (ref 22–32)
CREATININE: 0.56 mg/dL (ref 0.44–1.00)
Glucose, Bld: 157 mg/dL — ABNORMAL HIGH (ref 65–99)
Potassium: 3.3 mmol/L — ABNORMAL LOW (ref 3.5–5.1)
SODIUM: 143 mmol/L (ref 135–145)

## 2015-02-21 LAB — TROPONIN I

## 2015-02-21 LAB — GLUCOSE, CAPILLARY: Glucose-Capillary: 134 mg/dL — ABNORMAL HIGH (ref 65–99)

## 2015-02-21 SURGERY — ARTHROPLASTY, KNEE, TOTAL
Anesthesia: Choice | Laterality: Right

## 2015-02-21 MED ORDER — CEFAZOLIN SODIUM-DEXTROSE 2-3 GM-% IV SOLR: 2.0000 g | INTRAVENOUS | Status: DC

## 2015-02-21 MED ORDER — CHLORHEXIDINE GLUCONATE 4 % EX LIQD: 60.0000 mL | Freq: Once | CUTANEOUS | Status: DC

## 2015-02-21 MED ORDER — SODIUM CHLORIDE 0.9 % IV SOLN: INTRAVENOUS | Status: DC

## 2015-02-21 MED ORDER — SODIUM CHLORIDE 0.9 % IJ SOLN
INTRAMUSCULAR | Status: AC
  Filled 2015-02-21: qty 3

## 2015-02-21 MED ORDER — CEFAZOLIN SODIUM-DEXTROSE 2-3 GM-% IV SOLR
INTRAVENOUS | Status: AC
  Filled 2015-02-21: qty 50

## 2015-02-21 MED ORDER — METOPROLOL TARTRATE 25 MG PO TABS
12.5000 mg | ORAL_TABLET | Freq: Once | ORAL | Status: AC
  Administered 2015-02-21: 12.5 mg via ORAL
  Filled 2015-02-21: qty 1

## 2015-02-21 MED ORDER — METOPROLOL TARTRATE 1 MG/ML IV SOLN
INTRAVENOUS | Status: AC
  Filled 2015-02-21: qty 5

## 2015-02-21 MED ORDER — CEFAZOLIN SODIUM-DEXTROSE 2-3 GM-% IV SOLR: 2.0000 g | Freq: Once | INTRAVENOUS | Status: DC

## 2015-02-21 SURGICAL SUPPLY — 52 items
AUTOTRANSFUS HAS 1/8 (MISCELLANEOUS)
BATTERY INSTRU NAVIGATION (MISCELLANEOUS) IMPLANT
BLADE SAW 1 (BLADE) IMPLANT
BLADE SAW 1/2 (BLADE) IMPLANT
CANISTER SUCT 1200ML W/VALVE (MISCELLANEOUS) IMPLANT
CANISTER SUCT 3000ML (MISCELLANEOUS) IMPLANT
CATH TRAY METER 16FR LF (MISCELLANEOUS) IMPLANT
COOLER POLAR GLACIER W/PUMP (MISCELLANEOUS) IMPLANT
DRAPE SHEET LG 3/4 BI-LAMINATE (DRAPES) IMPLANT
DRSG DERMACEA 8X12 NADH (GAUZE/BANDAGES/DRESSINGS) IMPLANT
DRSG OPSITE POSTOP 4X14 (GAUZE/BANDAGES/DRESSINGS) IMPLANT
DURAPREP 26ML APPLICATOR (WOUND CARE) IMPLANT
ELECT CAUTERY BLADE 6.4 (BLADE) IMPLANT
EX-PIN ORTHOLOCK NAV 4X150 (PIN) IMPLANT
GLOVE BIOGEL M STRL SZ7.5 (GLOVE) IMPLANT
GLOVE INDICATOR 8.0 STRL GRN (GLOVE) IMPLANT
GLOVE SURG 9.0 ORTHO LTXF (GLOVE) IMPLANT
GLOVE SURG ORTHO 9.0 STRL STRW (GLOVE) IMPLANT
GOWN STRL REUS W/ TWL LRG LVL3 (GOWN DISPOSABLE) IMPLANT
GOWN STRL REUS W/TWL 2XL LVL3 (GOWN DISPOSABLE) IMPLANT
GOWN STRL REUS W/TWL LRG LVL3 (GOWN DISPOSABLE)
GOWN STRL REUS W/TWL XL LVL4 (GOWN DISPOSABLE) IMPLANT
HANDPIECE SUCTION TUBG SURGILV (MISCELLANEOUS) IMPLANT
HOLDER FOLEY CATH W/STRAP (MISCELLANEOUS) IMPLANT
HOOD PEEL AWAY FACE SHEILD DIS (HOOD) IMPLANT
KNIFE SCULPS 14X20 (INSTRUMENTS) IMPLANT
NDL SAFETY 18GX1.5 (NEEDLE) IMPLANT
NEEDLE SPNL 20GX3.5 QUINCKE YW (NEEDLE) IMPLANT
NS IRRIG 500ML POUR BTL (IV SOLUTION) IMPLANT
PACK TOTAL KNEE (MISCELLANEOUS) IMPLANT
PAD GROUND ADULT SPLIT (MISCELLANEOUS) IMPLANT
PAD WRAPON POLAR KNEE (MISCELLANEOUS) IMPLANT
PIN FIXATION 1/8DIA X 3INL (PIN) ×2 IMPLANT
SOL .9 NS 3000ML IRR  AL (IV SOLUTION)
SOL .9 NS 3000ML IRR UROMATIC (IV SOLUTION) IMPLANT
SOL PREP PVP 2OZ (MISCELLANEOUS)
SOLUTION PREP PVP 2OZ (MISCELLANEOUS) IMPLANT
SPONGE DRAIN TRACH 4X4 STRL 2S (GAUZE/BANDAGES/DRESSINGS) IMPLANT
STAPLER SKIN PROX 35W (STAPLE) IMPLANT
STRAP SAFETY BODY (MISCELLANEOUS) IMPLANT
SUCTION FRAZIER TIP 10 FR DISP (SUCTIONS) IMPLANT
SUT VIC AB 0 CT1 36 (SUTURE) IMPLANT
SUT VIC AB 1 CT1 36 (SUTURE) IMPLANT
SUT VIC AB 2-0 CT2 27 (SUTURE) IMPLANT
SYR 20CC LL (SYRINGE) IMPLANT
SYR 30ML LL (SYRINGE) IMPLANT
SYR 50ML LL SCALE MARK (SYRINGE) IMPLANT
SYSTEM AUTOTRANSFUS DUAL TROCR (MISCELLANEOUS) IMPLANT
TOWEL OR 17X26 4PK STRL BLUE (TOWEL DISPOSABLE) IMPLANT
TOWER CARTRIDGE SMART MIX (DISPOSABLE) IMPLANT
WATER STERILE IRR 1000ML POUR (IV SOLUTION) IMPLANT
WRAPON POLAR PAD KNEE (MISCELLANEOUS)

## 2015-02-21 NOTE — ED Notes (Signed)
Pt to ED 16 from pre-op.  When her vitals were checked, she was tachycardic so an EKG was done and she was found to be in afib/aflutter.    Pt denies chest pain, SOB, dizziness, palpitations.

## 2015-02-21 NOTE — ED Provider Notes (Addendum)
[facility] Emergency Department Provider Note  ____________________________________________  Time seen: 8 AM  I have reviewed the triage vital signs and the nursing notes.   HISTORY  Chief Complaint Atrial Fibrillation  tachycardia    HPI Bonnie Rowland is a 74 y.o. female who came to [facility] today for a elective operation on her knee. During her preop preparation, they found the patient was tachycardic. An EKG was performed which showed she was in atrial flutter. She was sent to the emergency department for further evaluation.  Upon seeing the patient, on a cardiac monitor, she was no longer in atrial flutter but was rather in sinus rhythm with occasional PVC and the possibility of an atrial dysrhythmia. Additional EKG pending.  The patient denies any chest pain or shortness of breath. She denies any prior episodes of atrial fibrillation or atrial flutter.     Past Medical History  Diagnosis Date  . Hyperlipidemia   . Hypertension   . Renal artery stenosis   . Sprain of lumbar region   . Thoracic or lumbosacral neuritis or radiculitis, unspecified   . Allergic rhinitis, cause unspecified   . Diabetes mellitus     type II  . 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 Dermatophytosis of nail   . Esophageal reflux   . Atherosclerosis of renal artery   . Cellulitis and abscess of leg, except foot   . Urinary tract infection, site not specified   . Acute cystitis   . Conjunctivitis unspecified   . Bronchitis, not specified as acute or chronic   . 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 Cancer 05/2014    basal cell removed left temple    Patient Active Problem List   Diagnosis Date Noted  . Chronic pain 01/10/2015  . Diabetes mellitus type 2, uncontrolled 01/10/2015  . Fatigue 01/10/2015  . Malignant melanoma of back 01/10/2015  . Adiposity 01/10/2015  . 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 Generalized OA 01/10/2015  .  Acute maxillary sinusitis 12/27/2014  . Spinal stenosis, lumbar region, 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  . DDD (degenerative disc disease), lumbar 11/14/2014  . Neuropathy due to secondary diabetes 11/14/2014  . Greater trochanteric bursitis of both hips 11/14/2014  . Piriformis syndrome 11/14/2014  . 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 Osteoarthritis of knee 05/21/2014  . Arthritis of knee, degenerative 11/06/2013  . Chest pain 11/06/2010  . Diabetes type 2, uncontrolled 11/07/2009  . Hyperlipemia 11/07/2009  . HYPERTENSION, BENIGN 11/07/2009  . RENAL ATHEROSCLEROSIS 11/07/2009  . Allergic rhinitis 11/27/2007  . Lumbosacral neuritis 01/17/2007    Past Surgical History  Procedure Laterality Date  . Cardiac catheterization    . Appendectomy    . Colonoscopy    . Diagnostic mammogram    . Cataract surgery    . Tonsillectomy    . Eye surgery Bilateral     Cataract Extraction  . Knee arthroscopy Right     Current Outpatient Rx  Name  Route  Sig  Dispense  Refill  . aspirin 81 MG EC tablet   Oral   Take 81 mg by mouth daily.           . B Complex-C (B-COMPLEX WITH VITAMIN C) tablet   Oral   Take 2 tablets by mouth at bedtime.          . Coenzyme Q10 (COQ-10) 100 MG CAPS   Oral   Take 1 capsule by mouth at  bedtime.          . Coenzyme Q10 (COQ10) 200 MG CAPS   Oral   Take 1 tablet by mouth daily.         Marland Kitchen estradiol (ESTRACE VAGINAL) 0.1 MG/GM vaginal cream      1 application daily.         . fish oil-omega-3 fatty acids 1000 MG capsule   Oral   Take 2 g by mouth daily.           Marland Kitchen GARLIC 4098 PO   Oral   Take 1 tablet by mouth daily.           . Glucosamine 500 MG TABS   Oral   Take 1 tablet by mouth daily.           . Hydrocodone-Acetaminophen 10-300 MG TABS   Oral   Take 1 tablet by mouth every 8 (eight) hours as needed.   270 each   0   . Insulin Glargine (LANTUS SOLOSTAR) 100 UNIT/ML Solostar Pen   Subcutaneous    Inject 26 Units into the skin daily at 10 pm. Patient taking differently: Inject 28 Units into the skin at bedtime. 18 units last night   5 pen   PRN   . Liraglutide (VICTOZA) 18 MG/3ML SOLN   Subcutaneous   Inject 1.2 mg into the skin daily.          Marland Kitchen losartan-hydrochlorothiazide (HYZAAR) 100-25 MG per tablet   Oral   Take 1 tablet by mouth daily.         . metaxalone (SKELAXIN) 800 MG tablet   Oral   Take 1 tablet (800 mg total) by mouth 3 (three) times daily. Patient taking differently: Take 800 mg by mouth daily.    270 tablet   0   . metformin (FORTAMET) 500 MG (OSM) 24 hr tablet   Oral   Take 1,000 mg by mouth 2 (two) times daily with a meal.          . Multiple Vitamin (MULTIVITAMIN) tablet   Oral   Take 1 tablet by mouth daily.          . Multiple Vitamins-Minerals (PRESERVISION AREDS 2) CAPS   Oral   Take 1 capsule by mouth 2 (two) times daily.         Marland Kitchen omeprazole (PRILOSEC) 20 MG capsule      TAKE 1 BY MOUTH DAILY Patient taking differently: Take 20 mg capsule every other day   90 capsule   0   . Propylene Glycol (SYSTANE BALANCE OP)   Ophthalmic   Apply 1 drop to eye as needed.         . raloxifene (EVISTA) 60 MG tablet      TAKE 1 BY MOUTH EVERY MORNING   90 tablet   0   . rosuvastatin (CRESTOR) 10 MG tablet      TAKE 1 BY MOUTH DAILY   90 tablet   0   . sulfacetamide (BLEPH-10) 10 % ophthalmic solution   Both Eyes   Place 1 drop into both eyes 3 (three) times daily as needed.         . vitamin C (ASCORBIC ACID) 500 MG tablet   Oral   Take 500 mg by mouth 2 (two) times daily.         . cholecalciferol (VITAMIN D) 1000 UNITS tablet   Oral   Take 4,000 Units by mouth daily.          Marland Kitchen  fluconazole (DIFLUCAN) 150 MG tablet   Oral   Take 150 mg by mouth as needed.           Allergies Cheese and Ciprofloxacin hcl  Family History  Problem Relation Age of Onset  . Diabetes Mother   . Hypertension Mother   .  Cancer Mother   . Hypertension Father   . Cancer Father     Social History Social History  Substance Use Topics  . Smoking status: Never Smoker   . Smokeless tobacco: Never Used  . Alcohol Use: No     Comment: occasional    Review of Systems  Constitutional: Negative for fever. ENT: Negative for sore throat. Cardiovascular: Negative for chest pain. Respiratory: Negative for shortness of breath. Gastrointestinal: Negative for abdominal pain, vomiting and diarrhea. Genitourinary: Negative for dysuria. Musculoskeletal: No myalgias or injuries. Skin: Negative for rash. Neurological: Negative for headaches   10-point ROS otherwise negative.  ____________________________________________   PHYSICAL EXAM:  VITAL SIGNS: ED Triage Vitals  Enc Vitals Group     BP 02/21/15 0745 154/7 mmHg     Pulse Rate 02/21/15 0745 138     Resp 02/21/15 0813 16     Temp 02/21/15 0745 100.2 F (37.9 C)     Temp Source 02/21/15 0745 Oral     SpO2 02/21/15 0745 97 %     Weight 02/21/15 0745 195 lb (88.451 kg)     Height 02/21/15 0745 5\' 4"  (1.626 m)     Head Cir --      Peak Flow --      Pain Score 02/21/15 0804 0     Pain Loc --      Pain Edu? --      Excl. in [place]? --     Constitutional:  Alert and oriented. Well appearing and in no distress. ENT   Head: Normocephalic and atraumatic.   Nose: No congestion/rhinnorhea.   Mouth/Throat: Mucous membranes are moist. Cardiovascular: Normal rate in the 80s, overall regular but with occasional irregularity, no murmur noted Respiratory:  Normal respiratory effort, no tachypnea.    Breath sounds are clear and equal bilaterally.  Gastrointestinal: Soft and nontender. No distention.  Back: No muscle spasm, no tenderness, no CVA tenderness. Musculoskeletal: No deformity noted. Nontender with normal range of motion in all extremities.  No noted edema. Neurologic:  Normal speech and language. No gross focal neurologic deficits are  appreciated.  Skin:  Skin is warm, dry. No rash noted. Psychiatric: Mood and affect are normal. Speech and behavior are normal.  ____________________________________________    LABS (pertinent positives/negatives)  Labs Reviewed  BASIC METABOLIC PANEL - Abnormal; Notable for the following:    Potassium 3.3 (*)    Glucose, Bld 157 (*)    Calcium 8.8 (*)    All other components within normal limits  CBC  TROPONIN I     ____________________________________________   EKG  ED ECG REPORT I, Keilah Lemire W, the attending physician, personally viewed and interpreted this ECG.   Date: 02/21/2015  EKG Time: 6:49 AM  Rate: 145  Rhythm:Atrial flutter with variable AV block with PVCs  Axis: Normal  Intervals: Normal  ST&T Change: None   ____________________________________________    RADIOLOGY  Chest x-ray:  IMPRESSION: No active cardiopulmonary disease.  ____________________________________________   INITIAL IMPRESSION / ASSESSMENT AND PLAN / ED COURSE  Pertinent labs & imaging results that were available during my care of the patient were reviewed by me and considered in my medical decision  making (see chart for details).   74 year old female with new onset atrial flutter, now back to sinus rhythm with an arrhythmia. Labs pending.  ----------------------------------------- 10:23 AM on 02/21/2015 -----------------------------------------  Repeat EKG ED ECG REPORT I, Ambry Dix W, the attending physician, personally viewed and interpreted this ECG.   Date: 02/21/2015  EKG Time: 933  Rate: 80  Rhythm: Normal sinus rhythm  Axis: Normal  Intervals: Normal  ST&T Change: None noted  At this time, the patient is alert and pleasant and in no acute distress. She is worried about when she will have the knee surgery that was scheduled for today done. The family is concerned about this to and was hoping she would be admitted to the hospital so she could have the  knee surgery soon. Given the episode of atrial flutter she had today, I do not think that the surgery would be done today, even if there was time on the schedule.   Her cardiologist is Dr. Rockey Situ. I have spoke with him. He will see the patient today or tomorrow.   ____________________________________________   FINAL CLINICAL IMPRESSION(S) / ED DIAGNOSES  Final diagnoses:  Typical atrial flutter      Ahmed Prima, MD 02/21/15 1024   Ahmed Prima, MD 02/21/15 1045

## 2015-02-21 NOTE — OR Nursing (Signed)
Patient arrived to SDS reports being very nervous but answering all questions appropriately. Upon obtaining VS patient noted to have HR in the 130's and was irregular. Pt. Placed in bed and connected to EKG monitor which is showing a rapid HR in the 120's-150s and is irregular. Stat EKG ordered per Dr. Marry Guan and he has been updated on the above situation.

## 2015-02-21 NOTE — Discharge Instructions (Signed)
Your episode of atrial flutter has resolved. Your blood tests look good. We spoke with Dr. Rockey Situ who will see you most likely tomorrow afternoon. Continue with your current medications. Return to the emergency department if you have weakness, shortness of breath, or palpations, or if you have other urgent concerns.  Atrial Flutter Atrial flutter is a heart rhythm that can cause the heart to beat very fast (tachycardia). It originates in the upper chambers of the heart (atria). In atrial flutter, the top chambers of the heart (atria) often beat much faster than the bottom chambers of the heart (ventricles). Atrial flutter has a regular "saw toothed" appearance in an EKG readout. An EKG is a test that records the electrical activity of the heart. Atrial flutter can cause the heart to beat up to 150 beats per minute (BPM). Atrial flutter can either be short lived (paroxysmal) or permanent.  CAUSES  Causes of atrial flutter can be many. Some of these include:  Heart related issues:  Heart attack (myocardial infarction).  Heart failure.  Heart valve problems.  Poorly controlled high blood pressure (hypertension).  After open heart surgery.  Lung related issues:  A blood clot in the lungs (pulmonary embolism).  Chronic obstructive pulmonary disease (COPD). Medications used to treat COPD can attribute to atrial flutter.  Other related causes:  Hyperthyroidism.  Caffeine.  Some decongestant cold medications.  Low electrolyte levels such as potassium or magnesium.  Cocaine. SYMPTOMS  An awareness of your heart beating rapidly (palpitations).  Shortness of breath.  Chest pain.  Low blood pressure (hypotension).  Dizziness or fainting. DIAGNOSIS  Different tests can be performed to diagnose atrial flutter.   An EKG.  Holter monitor. This is a 24-hour recording of your heart rhythm. You will also be given a diary. Write down all symptoms that you have and what you were doing  at the time you experienced symptoms.  Cardiac event monitor. This small device can be worn for up to 30 days. When you have heart symptoms, you will push a button on the device. This will then record your heart rhythm.  Echocardiogram. This is an imaging test to look at your heart. Your caregiver will look at your heart valves and the ventricles.  Stress test. This test can help determine if the atrial flutter is related to exercise or if coronary artery disease is present.  Laboratory studies will look at certain blood levels like:  Complete blood count (CBC).  Potassium.  Magnesium.  Thyroid function. TREATMENT  Treatment of atrial flutter varies. A combination of therapies may be used or sometimes atrial flutter may need only 1 type of treatment.  Lab work: If your blood work, such as your electrolytes (potassium, magnesium) or your thyroid function tests, are abnormal, your caregiver will treat them accordingly.  Medication:  There are several different types of medications that can convert your heart to a normal rhythm and prevent atrial flutter from reoccurring.  Nonsurgical procedures: Nonsurgical techniques may be used to control atrial flutter. Some examples include:  Cardioversion. This technique uses either drugs or an electrical shock to restore a normal heart rhythm:  Cardioversion drugs may be given through an intravenous (IV) line to help "reset" the heart rhythm.  In electrical cardioversion, your caregiver shocks your heart with electrical energy. This helps to reset the heartbeat to a normal rhythm.  Ablation. If atrial flutter is a persistent problem, an ablation may be needed. This procedure is done under mild sedation. High frequency radio-wave  energy is used to destroy the area of heart tissue responsible for atrial flutter. SEEK IMMEDIATE MEDICAL CARE IF:  You have:  Dizziness.  Near fainting or fainting.  Shortness of breath.  Chest pain or  pressure.  Sudden nausea or vomiting.  Profuse sweating. If you have the above symptoms, call your local emergency service immediately! Do not drive yourself to the hospital. MAKE SURE YOU:   Understand these instructions.  Will watch your condition.  Will get help right away if you are not doing well or get worse. Document Released: 11/04/2008 Document Revised: 11/02/2013 Document Reviewed: 11/04/2008 [facility] Patient Information 2015 [place], [place]. This information is not intended to replace advice given to you by your health care provider. Make sure you discuss any questions you have with your health care provider.

## 2015-02-21 NOTE — OR Nursing (Signed)
Dr. Josem Kaufmann and Dr. Marry Guan had discussion [id number]. They have decided to post-pone surgery and send patient to ED for medical management. Pt. Is stable without any symptoms is was transported to ED with monitor on. Report to Magdalen Spatz RN.

## 2015-02-22 ENCOUNTER — Encounter: Payer: Self-pay | Admitting: Cardiovascular Disease

## 2015-02-22 ENCOUNTER — Ambulatory Visit (INDEPENDENT_AMBULATORY_CARE_PROVIDER_SITE_OTHER): Payer: BLUE CROSS/BLUE SHIELD | Admitting: Cardiovascular Disease

## 2015-02-22 VITALS — BP 126/70 | HR 68 | Ht 64.0 in | Wt 192.5 lb

## 2015-02-22 DIAGNOSIS — Z01810 Encounter for preprocedural cardiovascular examination: Secondary | ICD-10-CM

## 2015-02-22 DIAGNOSIS — IMO0002 Reserved for concepts with insufficient information to code with codable children: Secondary | ICD-10-CM

## 2015-02-22 DIAGNOSIS — I10 Essential (primary) hypertension: Secondary | ICD-10-CM

## 2015-02-22 DIAGNOSIS — I483 Typical atrial flutter: Secondary | ICD-10-CM

## 2015-02-22 DIAGNOSIS — M170 Bilateral primary osteoarthritis of knee: Secondary | ICD-10-CM

## 2015-02-22 DIAGNOSIS — I4892 Unspecified atrial flutter: Secondary | ICD-10-CM | POA: Diagnosis not present

## 2015-02-22 DIAGNOSIS — E785 Hyperlipidemia, unspecified: Secondary | ICD-10-CM

## 2015-02-22 DIAGNOSIS — E1165 Type 2 diabetes mellitus with hyperglycemia: Secondary | ICD-10-CM

## 2015-02-22 MED ORDER — FLECAINIDE ACETATE 50 MG PO TABS: 50.0000 mg | ORAL_TABLET | Freq: Two times a day (BID) | ORAL | Status: DC

## 2015-02-22 MED ORDER — METOPROLOL SUCCINATE ER 25 MG PO TB24: 25.0000 mg | ORAL_TABLET | Freq: Every day | ORAL | Status: DC

## 2015-02-22 NOTE — Assessment & Plan Note (Signed)
Blood pressure is well controlled on today's visit. No changes made to the medications. We'll add metoprolol for rhythm control

## 2015-02-22 NOTE — Patient Instructions (Addendum)
You are doing well.  Please restart potassium  and magnesium twice a day Take toprolol one a day Start flecainide twice a day  We will schedule an echocardiogram for atrial fibrillation  Please call us if you have new issues that need to be addressed before your next appt.  Your physician wants you to follow-up in: 1 month. Echocardiogram An echocardiogram, or echocardiography, uses sound waves (ultrasound) to produce an image of your heart. The echocardiogram is simple, painless, obtained within a short period of time, and offers valuable information to your health care provider. The images from an echocardiogram can provide information such as:  Evidence of coronary artery disease (CAD).  Heart size.  Heart muscle function.  Heart valve function.  Aneurysm detection.  Evidence of a past heart attack.  Fluid buildup around the heart.  Heart muscle thickening.  Assess heart valve function. LET [facility] CARE PROVIDER KNOW ABOUT:  Any allergies you have.  All medicines you are taking, including vitamins, herbs, eye drops, creams, and over-the-counter medicines.  Previous problems you or members of your family have had with the use of anesthetics.  Any blood disorders you have.  Previous surgeries you have had.  Medical conditions you have.  Possibility of pregnancy, if this applies. BEFORE THE PROCEDURE  No special preparation is needed. Eat and drink normally.  PROCEDURE   In order to produce an image of your heart, gel will be applied to your chest and a wand-like tool (transducer) will be moved over your chest. The gel will help transmit the sound waves from the transducer. The sound waves will harmlessly bounce off your heart to allow the heart images to be captured in real-time motion. These images will then be recorded.  You may need an IV to receive a medicine that improves the quality of the pictures. AFTER THE PROCEDURE You may return to your normal  schedule including diet, activities, and medicines, unless your health care provider tells you otherwise. Document Released: 06/15/2000 Document Revised: 11/02/2013 Document Reviewed: 02/23/2013 [facility] Patient Information 2015 [place], [place]. This information is not intended to replace advice given to you by your health care provider. Make sure you discuss any questions you have with your health care provider.

## 2015-02-22 NOTE — Progress Notes (Signed)
Patient ID: Bonnie Rowland, female    DOB: 06-Apr-1941, 74 y.o.   MRN: [id number]  HPI Comments: Bonnie Rowland is a very pleasant 74 year old woman with a history of chest pain, normal coronary arteries by cardiac catheterization April 2007 , < 50%  renal artery disease,  hyperlipidemia, diabetes, hypertension, obesity who presents for evaluation of recent episode of atrial flutter  02/21/15 she presented to [facility] for right total knee replacement by Dr. Marry Guan. She reported developing tachycardia that morning. She had extreme pain, was rushing to get to the hospital. EKG showed atrial flutter with ventricular rate 140 bpm. She was sent to the emergency room, 3 hours later was back in normal sinus rhythm. No medications were given to restore normal sinus rhythm. Since then she denies any further episodes of tachycardia. Knee pain continues to be a problem.  Hemoglobin A1c had increased on prednisone, into the 8 range, now improved in the 7 range  EKG on today's visit shows normal sinus rhythm with rate 68 bpm, no significant ST or T-wave changes  Other past medical history Previous lab work Total cholesterol 120, LDL in the 50s  She also has a history of sciatica and a meniscus tear on the left. She had a echocardiogram in May 2006 which was essentially normal with ejection fraction 60%.   Stress test in 06, 03 and 01 had been normal.   Allergies  Allergen Reactions  . Cheese Other (See Comments)    bloating  . Ciprofloxacin Hcl Other (See Comments)    Muscle pain    Current Outpatient Prescriptions on File Prior to Visit  Medication Sig Dispense Refill  . B Complex-C (B-COMPLEX WITH VITAMIN C) tablet Take 2 tablets by mouth at bedtime.     . cholecalciferol (VITAMIN D) 1000 UNITS tablet Take 4,000 Units by mouth daily.     . Coenzyme Q10 (COQ10) 200 MG CAPS Take 1 tablet by mouth daily.    Marland Kitchen estradiol (ESTRACE VAGINAL) 0.1 MG/GM vaginal cream 1 application 2 (two) times daily.      . fish oil-omega-3 fatty acids 1000 MG capsule Take 2 g by mouth daily.      . fluconazole (DIFLUCAN) 150 MG tablet Take 150 mg by mouth as needed.    Marland Kitchen GARLIC 9485 PO Take 1 tablet by mouth daily.      . Glucosamine 500 MG TABS Take 1 tablet by mouth daily.      . Hydrocodone-Acetaminophen 10-300 MG TABS Take 1 tablet by mouth every 8 (eight) hours as needed. 270 each 0  . Insulin Glargine (LANTUS SOLOSTAR) 100 UNIT/ML Solostar Pen Inject 26 Units into the skin daily at 10 pm. (Patient taking differently: Inject 28 Units into the skin at bedtime. 18 units last night) 5 pen PRN  . Liraglutide (VICTOZA) 18 MG/3ML SOLN Inject 1.2 mg into the skin daily.     Marland Kitchen losartan-hydrochlorothiazide (HYZAAR) 100-25 MG per tablet Take 1 tablet by mouth daily.    . metaxalone (SKELAXIN) 800 MG tablet Take 1 tablet (800 mg total) by mouth 3 (three) times daily. (Patient taking differently: Take 800 mg by mouth daily. ) 270 tablet 0  . metformin (FORTAMET) 500 MG (OSM) 24 hr tablet Take 1,000 mg by mouth 2 (two) times daily with a meal.     . Multiple Vitamin (MULTIVITAMIN) tablet Take 1 tablet by mouth daily.     . Multiple Vitamins-Minerals (PRESERVISION AREDS 2) CAPS Take 1 capsule by mouth 2 (two) times daily.    Marland Kitchen  omeprazole (PRILOSEC) 20 MG capsule TAKE 1 BY MOUTH DAILY (Patient taking differently: Take 20 mg capsule every other day) 90 capsule 0  . Propylene Glycol (SYSTANE BALANCE OP) Apply 1 drop to eye as needed.    . raloxifene (EVISTA) 60 MG tablet TAKE 1 BY MOUTH EVERY MORNING 90 tablet 0  . rosuvastatin (CRESTOR) 10 MG tablet TAKE 1 BY MOUTH DAILY 90 tablet 0  . sulfacetamide (BLEPH-10) 10 % ophthalmic solution Place 1 drop into both eyes 3 (three) times daily as needed.    . vitamin C (ASCORBIC ACID) 500 MG tablet Take 500 mg by mouth 2 (two) times daily.    Marland Kitchen aspirin 81 MG EC tablet Take 81 mg by mouth daily.       No current facility-administered medications on file prior to visit.    Past  Medical History  Diagnosis Date  . Hyperlipidemia   . Hypertension   . Renal artery stenosis   . Sprain of lumbar region   . Thoracic or lumbosacral neuritis or radiculitis, unspecified   . Allergic rhinitis, cause unspecified   . Diabetes mellitus     type II  . 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 Dermatophytosis of nail   . Esophageal reflux   . Atherosclerosis of renal artery   . Cellulitis and abscess of leg, except foot   . Urinary tract infection, site not specified   . Acute cystitis   . Conjunctivitis unspecified   . Bronchitis, not specified as acute or chronic   . 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 Appendectomy    . Colonoscopy    . Diagnostic mammogram    . Cataract surgery    . Tonsillectomy    . Eye surgery Bilateral     Cataract Extraction  . Knee arthroscopy Right   . Cardiac catheterization      Social History  reports that she has never smoked. She has never used smokeless tobacco. She reports that she does not drink alcohol or use illicit drugs.  Family History family history includes Cancer in her father and mother; Diabetes in her mother; Hypertension in her father and mother.   Review of Systems  Constitutional: Negative.   HENT: Negative.   Eyes: Negative.   Respiratory: Negative.   Cardiovascular: Negative.   Gastrointestinal: Negative.   Endocrine: Negative.   Musculoskeletal: Positive for arthralgias.  Skin: Negative.   Allergic/Immunologic: Negative.   Neurological: Negative.   Hematological: Negative.   Psychiatric/Behavioral: Negative.   All other systems reviewed and are negative.   BP 126/70 mmHg  Pulse 68  Ht 5\' 4"  (1.626 m)  Wt 192 lb 8 oz (87.317 kg)  BMI 33.03 kg/m2  Physical Exam  Constitutional: She is oriented to person, place, and time. She appears well-developed and well-nourished.   HENT:  Head: Normocephalic.  Nose: Nose normal.  Mouth/Throat: Oropharynx is clear and moist.  Eyes: Conjunctivae are normal. Pupils are equal, round, and reactive to light.  Neck: Normal range of motion. Neck supple. No JVD present.  Cardiovascular: Normal rate, regular rhythm, S1 normal, S2 normal, normal heart sounds and intact distal pulses.  Exam reveals no gallop and no friction rub.   No murmur heard. Pulmonary/Chest: Effort normal and breath sounds normal. No respiratory distress. She has no wheezes. She has no rales. She exhibits  no tenderness.  Abdominal: Soft. Bowel sounds are normal. She exhibits no distension. There is no tenderness.  Musculoskeletal: Normal range of motion. She exhibits no edema or tenderness.  Lymphadenopathy:    She has no cervical adenopathy.  Neurological: She is alert and oriented to person, place, and time. Coordination normal.  Skin: Skin is warm and dry. No rash noted. No erythema.  Psychiatric: She has a normal mood and affect. Her behavior is normal. Judgment and thought content normal.    Assessment and Plan  Nursing note and vitals reviewed.       

## 2015-02-22 NOTE — Assessment & Plan Note (Addendum)
Arrhythmia yesterday morning likely brought on by low potassium, severe pain. We have recommended she restart her potassium supplements while she takes HCTZ. She takes potassium twice a day. We'll also start her on metoprolol succinate 25 mg daily, flecainide 50 mg twice a day through the perioperative period.

## 2015-02-22 NOTE — Assessment & Plan Note (Signed)
Cholesterol is at goal on the current lipid regimen. No changes to the medications were made.  

## 2015-02-22 NOTE — Assessment & Plan Note (Signed)
Severe knee arthritis, plan is to get her back on the schedule with Dr. Marry Guan for knee replacement surgery

## 2015-02-22 NOTE — Assessment & Plan Note (Signed)
We have encouraged continued exercise, careful diet management in an effort to lose weight. 

## 2015-02-22 NOTE — Assessment & Plan Note (Signed)
Acceptable risk for upcoming total knee replacement surgery Medications changed on today's visit for rhythm control

## 2015-02-25 ENCOUNTER — Ambulatory Visit (INDEPENDENT_AMBULATORY_CARE_PROVIDER_SITE_OTHER): Payer: BLUE CROSS/BLUE SHIELD

## 2015-02-25 ENCOUNTER — Other Ambulatory Visit: Payer: Self-pay | Admitting: Family Medicine

## 2015-02-25 ENCOUNTER — Other Ambulatory Visit: Payer: Self-pay

## 2015-02-25 DIAGNOSIS — I4892 Unspecified atrial flutter: Secondary | ICD-10-CM | POA: Diagnosis not present

## 2015-02-28 ENCOUNTER — Telehealth: Payer: Self-pay | Admitting: *Deleted

## 2015-02-28 NOTE — Telephone Encounter (Signed)
Routed to Dr. Clydell Hakim office.

## 2015-02-28 NOTE — Telephone Encounter (Signed)
Patient is not on any long term full dose anticoagulation. No medications to hold. Would continue aspirin 81 mg. Acceptable risk for surgery.

## 2015-02-28 NOTE — Telephone Encounter (Signed)
Request for surgical clearance:  1. What type of surgery is being performed? Right total Knee Surgery  2. When is this surgery scheduled? Not yet   3. Are there any medications that need to be held prior to surgery and how long? Not sure  4. Name of physician performing surgery? Dr Hooten's office    5. What is your office phone and fax number? Fax: [phone].

## 2015-03-10 ENCOUNTER — Ambulatory Visit (INDEPENDENT_AMBULATORY_CARE_PROVIDER_SITE_OTHER): Payer: Medicare Other

## 2015-03-10 DIAGNOSIS — Z23 Encounter for immunization: Secondary | ICD-10-CM

## 2015-03-11 ENCOUNTER — Encounter: Payer: Self-pay | Admitting: Podiatry

## 2015-03-11 ENCOUNTER — Ambulatory Visit (INDEPENDENT_AMBULATORY_CARE_PROVIDER_SITE_OTHER): Payer: Medicare Other

## 2015-03-11 ENCOUNTER — Ambulatory Visit (INDEPENDENT_AMBULATORY_CARE_PROVIDER_SITE_OTHER): Payer: Medicare Other | Admitting: Podiatry

## 2015-03-11 VITALS — BP 152/66 | HR 61 | Resp 16

## 2015-03-11 DIAGNOSIS — Q828 Other specified congenital malformations of skin: Secondary | ICD-10-CM | POA: Diagnosis not present

## 2015-03-11 DIAGNOSIS — IMO0002 Reserved for concepts with insufficient information to code with codable children: Secondary | ICD-10-CM

## 2015-03-11 DIAGNOSIS — M79675 Pain in left toe(s): Secondary | ICD-10-CM

## 2015-03-11 DIAGNOSIS — E1165 Type 2 diabetes mellitus with hyperglycemia: Secondary | ICD-10-CM

## 2015-03-11 DIAGNOSIS — B351 Tinea unguium: Secondary | ICD-10-CM

## 2015-03-14 NOTE — Progress Notes (Signed)
Subjective:     Patient ID: Bonnie Rowland, female   DOB: 1941-05-13, 74 y.o.   MRN: [id number]  HPI patient presents with painful small glands on both feet that she cannot take care of herself   Review of Systems     Objective:   Physical Exam Long-term diabetic with no change in neurovascular status with lesion on the right and left plantar foot    Assessment:     Porokeratosis bilateral    Plan:     Debrided lesions and reappoint as needed

## 2015-03-15 ENCOUNTER — Ambulatory Visit (INDEPENDENT_AMBULATORY_CARE_PROVIDER_SITE_OTHER): Payer: Medicare Other | Admitting: Family Medicine

## 2015-03-15 ENCOUNTER — Encounter: Payer: Self-pay | Admitting: Family Medicine

## 2015-03-15 VITALS — BP 124/80 | HR 68 | Temp 98.9°F | Resp 16 | Ht 64.0 in | Wt 196.3 lb

## 2015-03-15 DIAGNOSIS — M48062 Spinal stenosis, lumbar region with neurogenic claudication: Secondary | ICD-10-CM

## 2015-03-15 DIAGNOSIS — M4806 Spinal stenosis, lumbar region: Secondary | ICD-10-CM

## 2015-03-15 DIAGNOSIS — I10 Essential (primary) hypertension: Secondary | ICD-10-CM | POA: Diagnosis not present

## 2015-03-15 DIAGNOSIS — G8929 Other chronic pain: Secondary | ICD-10-CM | POA: Diagnosis not present

## 2015-03-15 DIAGNOSIS — E785 Hyperlipidemia, unspecified: Secondary | ICD-10-CM

## 2015-03-15 DIAGNOSIS — I4892 Unspecified atrial flutter: Secondary | ICD-10-CM | POA: Diagnosis not present

## 2015-03-15 DIAGNOSIS — E1165 Type 2 diabetes mellitus with hyperglycemia: Secondary | ICD-10-CM

## 2015-03-15 DIAGNOSIS — IMO0002 Reserved for concepts with insufficient information to code with codable children: Secondary | ICD-10-CM

## 2015-03-15 MED ORDER — INSULIN GLARGINE 100 UNIT/ML SOLOSTAR PEN: 28.0000 [IU] | PEN_INJECTOR | Freq: Every day | SUBCUTANEOUS | Status: DC

## 2015-03-15 MED ORDER — LIRAGLUTIDE 18 MG/3ML ~~LOC~~ SOPN: 1.2000 mg | PEN_INJECTOR | Freq: Once | SUBCUTANEOUS | Status: DC

## 2015-03-15 MED ORDER — HYDROCODONE-ACETAMINOPHEN 10-325 MG PO TABS: 1.0000 | ORAL_TABLET | Freq: Three times a day (TID) | ORAL | Status: DC | PRN

## 2015-03-15 MED ORDER — INSULIN PEN NEEDLE 31G X 5 MM MISC: 1.0000 "pen " | Freq: Two times a day (BID) | Status: DC

## 2015-03-15 MED ORDER — METFORMIN HCL ER (OSM) 500 MG PO TB24: 1000.0000 mg | ORAL_TABLET | Freq: Two times a day (BID) | ORAL | Status: DC

## 2015-03-15 MED ORDER — GLUCOSE BLOOD VI STRP: ORAL_STRIP | Status: DC

## 2015-03-15 MED ORDER — CYCLOBENZAPRINE HCL 5 MG PO TABS: 5.0000 mg | ORAL_TABLET | Freq: Two times a day (BID) | ORAL | Status: DC

## 2015-03-15 MED ORDER — RALOXIFENE HCL 60 MG PO TABS: 60.0000 mg | ORAL_TABLET | Freq: Every day | ORAL | Status: DC

## 2015-03-15 MED ORDER — OMEPRAZOLE 20 MG PO CPDR: DELAYED_RELEASE_CAPSULE | ORAL | Status: DC

## 2015-03-15 MED ORDER — ROSUVASTATIN CALCIUM 10 MG PO TABS: 10.0000 mg | ORAL_TABLET | Freq: Every day | ORAL | Status: DC

## 2015-03-15 MED ORDER — LOSARTAN POTASSIUM-HCTZ 100-25 MG PO TABS: 1.0000 | ORAL_TABLET | Freq: Every day | ORAL | Status: DC

## 2015-03-15 MED ORDER — ONETOUCH ULTRASOFT LANCETS MISC: Status: DC

## 2015-03-15 NOTE — Progress Notes (Signed)
Name: Bonnie Rowland   MRN: [id number]    DOB: 1941/05/24   Date:03/15/2015       Progress Note  Subjective  Chief Complaint  Chief Complaint  Patient presents with  . Hypertension    refills on medication due to Medicare  . Diabetes  . Hyperlipidemia  . Knee Pain    Surgery schedule for March 30, 2015    Hypertension Associated symptoms include palpitations. Pertinent negatives include no blurred vision, chest pain, headaches, neck pain, orthopnea or shortness of breath.  Diabetes Pertinent negatives for hypoglycemia include no dizziness, headaches, nervousness/anxiousness, seizures or tremors. Associated symptoms include weakness. Pertinent negatives for diabetes include no blurred vision, no chest pain and no weight loss.  Hyperlipidemia Pertinent negatives include no chest pain, focal weakness, myalgias or shortness of breath.  Knee Pain  Pertinent negatives include no tingling.   Hypertension   Patient presents for follow-up of hypertension. It has been present for over 5 years.  Patient states that there is compliance with medical regimen which consists of losartan HCT 100/25 once daily . There is no end organ disease. Cardiac risk factors include hypertension hyperlipidemia and diabetes.  Exercise regimen consist of minimal walking .  Diet consist of some salt restriction .  Diabetes  Patient presents for follow-up of diabetes which is present for 10 years. Is currently on a regimen of Victoza, metformin 500 mg once daily and Lantus 28 units daily at bedtime, . Patient states minimal compliance with their diet and exercise. There's been no hypoglycemic episodes and there is no polyuria polydipsia polyphagia. His average fasting glucoses been in the low around mid to upper 100s   with a high around 150-180  . There is no end organ disease.  Last diabetic eye exam was unknown.   Last visit with dietitian was last year. Last microalbumin was obtained negative .    Hyperlipidemia  Patient has a history of hyperlipidemia for 5+ years.  Current medical regimen consist of Crestor 20 mg daily at bedtime as along with omega-3 fatty acids  Compliance is good .  Diet and exercise are currently followed rarely followed .  Risk factors for cardiovascular disease include hyperlipidemia diabetes, hyperlipidemia, hypertension next obesity, sedentary lifestyle .   There have been no side effects from the medication.    Obesity  Patient has a history of obesity for 20+ years.  Attempts at weight loss have included diet and exercise and nothing .  Results of this regimen  have been unsuccessful .  Patient now voices and interest in weight loss by  diet and exercise .  Osteoarthritis history of present illness  Patient has severe joint discomfort. She is scheduled for knee replacement with orthopedic surgeon   Past Medical History  Diagnosis Date  . Hyperlipidemia   . Hypertension   . Renal artery stenosis   . Sprain of lumbar region   . Thoracic or lumbosacral neuritis or radiculitis, unspecified   . Allergic rhinitis, cause unspecified   . Diabetes mellitus     type II  . 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 Dermatophytosis of nail   . Esophageal reflux   . Atherosclerosis of renal artery   . Cellulitis and abscess of leg, except foot   . Urinary tract infection, site not specified   . Acute cystitis   . Conjunctivitis unspecified   . Bronchitis, not specified as acute or chronic   . Cancer 12/2013    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; left shoulder  blade  . 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 Smoking status: Never Smoker   . Smokeless tobacco: Never Used  . Alcohol Use: No     Comment: occasional     Current outpatient prescriptions:  .  aspirin 81 MG EC tablet, Take 81 mg by mouth daily.  , Disp: , Rfl:  .  B Complex-C (B-COMPLEX WITH VITAMIN C) tablet, Take 2 tablets by mouth at bedtime. , Disp: ,  Rfl:  .  cholecalciferol (VITAMIN D) 1000 UNITS tablet, Take 4,000 Units by mouth daily. , Disp: , Rfl:  .  Coenzyme Q10 (COQ10) 200 MG CAPS, Take 1 tablet by mouth daily., Disp: , Rfl:  .  estradiol (ESTRACE VAGINAL) 0.1 MG/GM vaginal cream, 1 application 2 (two) times daily. , Disp: , Rfl:  .  fish oil-omega-3 fatty acids 1000 MG capsule, Take 2 g by mouth daily.  , Disp: , Rfl:  .  flecainide (TAMBOCOR) 50 MG tablet, Take 1 tablet (50 mg total) by mouth 2 (two) times daily., Disp: 60 tablet, Rfl: 6 .  fluconazole (DIFLUCAN) 150 MG tablet, Take 150 mg by mouth as needed., Disp: , Rfl:  .  GARLIC 7078 PO, Take 1 tablet by mouth daily.  , Disp: , Rfl:  .  Glucosamine 500 MG TABS, Take 1 tablet by mouth daily.  , Disp: , Rfl:  .  glucose blood (ONE TOUCH ULTRA TEST) test strip, Use as instructed, Disp: 50 each, Rfl: 12 .  HYDROcodone-acetaminophen (NORCO) 10-325 MG per tablet, Take 1 tablet by mouth every 8 (eight) hours as needed., Disp: 270 tablet, Rfl: 0 .  Hydrocodone-Acetaminophen 10-300 MG TABS, Take 1 tablet by mouth every 8 (eight) hours as needed., Disp: 270 each, Rfl: 0 .  Insulin Glargine (LANTUS SOLOSTAR) 100 UNIT/ML Solostar Pen, Inject 28 Units into the skin at bedtime., Disp: 15 pen, Rfl: PRN .  Insulin Pen Needle 31G X 5 MM MISC, 1 pen by Does not apply route 2 (two) times daily., Disp: 90 each, Rfl: 12 .  Lancets (ONETOUCH ULTRASOFT) lancets, Use as instructed, Disp: 100 each, Rfl: 12 .  Liraglutide (VICTOZA) 18 MG/3ML SOPN, Inject 0.2 mLs (1.2 mg total) into the skin once., Disp: 6 mL, Rfl: 1 .  losartan-hydrochlorothiazide (HYZAAR) 100-25 MG per tablet, Take 1 tablet by mouth daily., Disp: 90 tablet, Rfl: 1 .  metaxalone (SKELAXIN) 800 MG tablet, Take 1 tablet (800 mg total) by mouth 3 (three) times daily. (Patient taking differently: Take 800 mg by mouth daily. ), Disp: 270 tablet, Rfl: 0 .  metformin (FORTAMET) 500 MG (OSM) 24 hr tablet, Take 2 tablets (1,000 mg total) by mouth  2 (two) times daily with a meal., Disp: 360 tablet, Rfl: 1 .  metoprolol succinate (TOPROL XL) 25 MG 24 hr tablet, Take 1 tablet (25 mg total) by mouth daily., Disp: 90 tablet, Rfl: 3 .  Multiple Vitamin (MULTIVITAMIN) tablet, Take 1 tablet by mouth daily. , Disp: , Rfl:  .  Multiple Vitamins-Minerals (PRESERVISION AREDS 2) CAPS, Take 1 capsule by mouth 2 (two) times daily., Disp: , Rfl:  .  NON FORMULARY, neutroferon vitamin daily., Disp: , Rfl:  .  omeprazole (PRILOSEC) 20 MG capsule, Take 20 mg capsule every other day, Disp: 90 capsule, Rfl: 1 .  Propylene Glycol (SYSTANE BALANCE OP), Apply 1 drop to eye as needed., Disp: , Rfl:  .  raloxifene (EVISTA) 60 MG  tablet, Take 1 tablet (60 mg total) by mouth daily., Disp: 90 tablet, Rfl: 1 .  rosuvastatin (CRESTOR) 10 MG tablet, Take 1 tablet (10 mg total) by mouth daily., Disp: 90 tablet, Rfl: 1 .  sulfacetamide (BLEPH-10) 10 % ophthalmic solution, Place 1 drop into both eyes 3 (three) times daily as needed., Disp: , Rfl:  .  vitamin C (ASCORBIC ACID) 500 MG tablet, Take 500 mg by mouth 2 (two) times daily., Disp: , Rfl:   Allergies  Allergen Reactions  . Cheese Other (See Comments)    bloating  . Ciprofloxacin Hcl Other (See Comments)    Muscle pain    Review of Systems  Constitutional: Negative for fever, chills and weight loss.  HENT: Negative for congestion, hearing loss, sore throat and tinnitus.   Eyes: Negative for blurred vision, double vision and redness.  Respiratory: Negative for cough, hemoptysis and shortness of breath.   Cardiovascular: Positive for palpitations. Negative for chest pain, orthopnea, claudication and leg swelling.  Gastrointestinal: Negative for heartburn, nausea, vomiting, diarrhea, constipation and blood in stool.  Genitourinary: Positive for urgency and frequency. Negative for dysuria and hematuria.  Musculoskeletal: Positive for back pain (spinal stenosis with plan for knee surgery as well later this month)  and joint pain (knee pain with prior plan for knee replacement). Negative for myalgias, falls and neck pain.  Skin: Negative for itching.  Neurological: Positive for weakness. Negative for dizziness, tingling, tremors, focal weakness, seizures, loss of consciousness and headaches.  Endo/Heme/Allergies: Does not bruise/bleed easily.  Psychiatric/Behavioral: Negative for depression and substance abuse. The patient is not nervous/anxious and does not have insomnia.      Objective  Filed Vitals:   03/15/15 0903  BP: 124/80  Pulse: 68  Temp: 98.9 F (37.2 C)  TempSrc: Oral  Resp: 16  Height: 5\' 4"  (1.626 m)  Weight: 196 lb 4.8 oz (89.041 kg)  SpO2: 94%     Physical Exam  Constitutional: She is oriented to person, place, and time and well-developed, well-nourished, and in no distress.  Morbidly obese with some problem with ambulation secondary to knee pain and sinus  HENT:  Head: Normocephalic.  Eyes: EOM are normal. Pupils are equal, round, and reactive to light.  Neck: Normal range of motion. No thyromegaly present.  Cardiovascular: Normal rate, regular rhythm and normal heart sounds.   No murmur heard. Pulmonary/Chest: Effort normal and breath sounds normal.  Abdominal: Soft. Bowel sounds are normal.  Musculoskeletal: She exhibits edema.  Hypertrophic changes with tenderness and crepitus noted with knee exam  Neurological: She is alert and oriented to person, place, and time. No cranial nerve deficit. Gait normal.  Skin: Skin is warm and dry. No rash noted.  Psychiatric: Memory and affect normal.      Assessment & Plan  1. Diabetes mellitus type 2, uncontrolled Not quite controlled  2. Hyperlipemia Continue current regimen  3. HYPERTENSION, BENIGN Not quite at goal  4. Chronic pain Probable source of elevation of blood pressure  5. Spinal stenosis, lumbar region, with neurogenic claudication Chronic problem   6. Atrial flutter, unspecified Now controlled  with flecainide and beta blocker and follow-up with cardiologist

## 2015-03-16 ENCOUNTER — Telehealth: Payer: Self-pay | Admitting: Cardiovascular Disease

## 2015-03-16 ENCOUNTER — Encounter
Admission: RE | Admit: 2015-03-16 | Discharge: 2015-03-16 | Disposition: A | Discharge status: Home / Self Care | Payer: Medicare Other | Source: Ambulatory Visit | Attending: Orthopedic Surgery | Admitting: Orthopedic Surgery

## 2015-03-16 DIAGNOSIS — Z01812 Encounter for preprocedural laboratory examination: Secondary | ICD-10-CM | POA: Insufficient documentation

## 2015-03-16 LAB — URINALYSIS COMPLETE WITH MICROSCOPIC (ARMC ONLY)
BILIRUBIN URINE: NEGATIVE
Bacteria, UA: NONE SEEN
GLUCOSE, UA: NEGATIVE mg/dL
Hgb urine dipstick: NEGATIVE
KETONES UR: NEGATIVE mg/dL
Leukocytes, UA: NEGATIVE
Nitrite: NEGATIVE
PROTEIN: NEGATIVE mg/dL
Specific Gravity, Urine: 1.005 (ref 1.005–1.030)
pH: 6 (ref 5.0–8.0)

## 2015-03-16 LAB — TYPE AND SCREEN
ABO/RH(D): O POS
ANTIBODY SCREEN: NEGATIVE

## 2015-03-16 LAB — BASIC METABOLIC PANEL
Anion gap: 8 (ref 5–15)
BUN: 8 mg/dL (ref 6–20)
CALCIUM: 9.4 mg/dL (ref 8.9–10.3)
CHLORIDE: 99 mmol/L — AB (ref 101–111)
CO2: 30 mmol/L (ref 22–32)
CREATININE: 0.58 mg/dL (ref 0.44–1.00)
GFR calc Af Amer: >60 mL/min (ref >60)
GFR calc non Af Amer: >60 mL/min (ref >60)
Glucose, Bld: 178 mg/dL — ABNORMAL HIGH (ref 65–99)
Potassium: 3.8 mmol/L (ref 3.5–5.1)
SODIUM: 137 mmol/L (ref 135–145)

## 2015-03-16 LAB — PROTIME-INR
INR: 1.14
PROTHROMBIN TIME: 14.8 s (ref 11.4–15.0)

## 2015-03-16 LAB — SURGICAL PCR SCREEN
MRSA, PCR: NEGATIVE
Staphylococcus aureus: NEGATIVE

## 2015-03-16 LAB — CBC
HCT: 39.5 % (ref 35.0–47.0)
HEMOGLOBIN: 13.1 g/dL (ref 12.0–16.0)
MCH: 31.2 pg (ref 26.0–34.0)
MCHC: 33.1 g/dL (ref 32.0–36.0)
MCV: 94.2 fL (ref 80.0–100.0)
Platelets: 230 10*3/uL (ref 150–440)
RBC: 4.19 MIL/uL (ref 3.80–5.20)
RDW: 13.5 % (ref 11.5–14.5)
WBC: 7.6 10*3/uL (ref 3.6–11.0)

## 2015-03-16 LAB — HEMOGLOBIN A1C: Hgb A1c MFr Bld: 7.1 % — ABNORMAL HIGH (ref 4.0–6.0)

## 2015-03-16 LAB — APTT: APTT: 31 s (ref 24–36)

## 2015-03-16 LAB — SEDIMENTATION RATE: SED RATE: 7 mm/h (ref 0–30)

## 2015-03-16 NOTE — Patient Instructions (Signed)
  Your procedure is scheduled on: Wednesday Sept. 28, 2016. Report to Same Day Surgery. To find out your arrival time please call [phone] between 1PM - 3PM on Tuesday Sept. 27, 2016.  Remember: Instructions that are not followed completely may result in serious medical risk, up to and including death, or upon the discretion of your surgeon and anesthesiologist your surgery may need to be rescheduled.    _x__ 1. Do not eat food or drink liquids after midnight. No gum chewing or hard candies.     ____ 2. No Alcohol for 24 hours before or after surgery.   ____ 3. Bring all medications with you on the day of surgery if instructed.    _x___ 4. Notify your doctor if there is any change in your medical condition     (cold, fever, infections).     Do not wear jewelry, make-up, hairpins, clips or nail polish.  Do not wear lotions, powders, or perfumes. You may wear deodorant.  Do not shave 48 hours prior to surgery. Men may shave face and neck.  Do not bring valuables to the hospital.    [facility] is not responsible for any belongings or valuables.               Contacts, dentures or bridgework may not be worn into surgery.  Leave your suitcase in the car. After surgery it may be brought to your room.  For patients admitted to the hospital, discharge time is determined by your treatment team.   Patients discharged the day of surgery will not be allowed to drive home.    Please read over the following fact sheets that you were given:   [facility] Preparing for Surgery  __x__ Take these medicines the morning of surgery with A SIP OF WATER:    1.metoprolol succinate (TOPROL XL)   2. omeprazole (PRILOSEC  3. flecainide (TAMBOCOR)   ____ Fleet Enema (as directed)   _x___ Use CHG Soap as directed  ____ Use inhalers on the day of surgery  _x___ Stop metformin 2 days prior to surgery    _x___ Take 1/2 of usual insulin dose the night before surgery and none on the morning of  surgery.   _x___ Stop Coumadin/Plavix/aspirin on Sept 21, 2016.  ____ Stop Anti-inflammatories on today.  May take tylenol or hydrocodone if needed for pain.   _x___ Stop supplements fish oil, glucosamine, alfalfa,CO Q 10, garlic & lecithin  until after surgery.  Do NOT stop magnesium with potassium.  ____ Bring C-Pap to the hospital.

## 2015-03-16 NOTE — Telephone Encounter (Signed)
Patient had to reschedule 1 m fu with Gollan .  Having knee surgery on 9/28 and will be at [facility].  Patient wants to make sure its ok to put off appt. Patient wants to be added to waitlist .

## 2015-03-16 NOTE — Telephone Encounter (Signed)
Spoke w/ pt's husband.  Advised him that pt was cleared to proceed w/ surgery at [facility] 02/22/15. Advised him that pt has been added to the wait list and will be called in the event of a cancellation.  Asked him to have pt call back w/ any questions or concerns.

## 2015-03-17 ENCOUNTER — Encounter: Payer: Self-pay | Admitting: Family Medicine

## 2015-03-17 LAB — URINE CULTURE

## 2015-03-25 DIAGNOSIS — M9903 Segmental and somatic dysfunction of lumbar region: Secondary | ICD-10-CM | POA: Diagnosis not present

## 2015-03-25 DIAGNOSIS — M5416 Radiculopathy, lumbar region: Secondary | ICD-10-CM | POA: Diagnosis not present

## 2015-03-25 DIAGNOSIS — M5033 Other cervical disc degeneration, cervicothoracic region: Secondary | ICD-10-CM | POA: Diagnosis not present

## 2015-03-25 DIAGNOSIS — M9901 Segmental and somatic dysfunction of cervical region: Secondary | ICD-10-CM | POA: Diagnosis not present

## 2015-03-28 DIAGNOSIS — M5416 Radiculopathy, lumbar region: Secondary | ICD-10-CM | POA: Diagnosis not present

## 2015-03-28 DIAGNOSIS — M9903 Segmental and somatic dysfunction of lumbar region: Secondary | ICD-10-CM | POA: Diagnosis not present

## 2015-03-28 DIAGNOSIS — M9901 Segmental and somatic dysfunction of cervical region: Secondary | ICD-10-CM | POA: Diagnosis not present

## 2015-03-28 DIAGNOSIS — M5033 Other cervical disc degeneration, cervicothoracic region: Secondary | ICD-10-CM | POA: Diagnosis not present

## 2015-03-30 ENCOUNTER — Encounter: Payer: Self-pay | Admitting: Orthopedic Surgery

## 2015-03-30 ENCOUNTER — Ambulatory Visit: Payer: Medicare Other | Admitting: Anesthesiology

## 2015-03-30 ENCOUNTER — Encounter
Admission: RE | Disposition: A | Discharge status: Skilled Nursing Facility | Payer: Self-pay | Source: Ambulatory Visit | Attending: Orthopedic Surgery

## 2015-03-30 ENCOUNTER — Inpatient Hospital Stay: Payer: Medicare Other

## 2015-03-30 ENCOUNTER — Inpatient Hospital Stay
Admission: RE | Admit: 2015-03-30 | Discharge: 2015-04-02 | DRG: 470 | Disposition: A | Discharge status: Skilled Nursing Facility | Payer: Medicare Other | Source: Ambulatory Visit | Attending: Orthopedic Surgery | Admitting: Orthopedic Surgery

## 2015-03-30 DIAGNOSIS — M5136 Other intervertebral disc degeneration, lumbar region: Secondary | ICD-10-CM | POA: Diagnosis present

## 2015-03-30 DIAGNOSIS — E1140 Type 2 diabetes mellitus with diabetic neuropathy, unspecified: Secondary | ICD-10-CM | POA: Diagnosis present

## 2015-03-30 DIAGNOSIS — Z79899 Other long term (current) drug therapy: Secondary | ICD-10-CM | POA: Diagnosis not present

## 2015-03-30 DIAGNOSIS — Z96651 Presence of right artificial knee joint: Secondary | ICD-10-CM | POA: Diagnosis not present

## 2015-03-30 DIAGNOSIS — I10 Essential (primary) hypertension: Secondary | ICD-10-CM | POA: Diagnosis present

## 2015-03-30 DIAGNOSIS — Z471 Aftercare following joint replacement surgery: Secondary | ICD-10-CM | POA: Diagnosis not present

## 2015-03-30 DIAGNOSIS — Z79891 Long term (current) use of opiate analgesic: Secondary | ICD-10-CM | POA: Diagnosis not present

## 2015-03-30 DIAGNOSIS — I701 Atherosclerosis of renal artery: Secondary | ICD-10-CM | POA: Diagnosis present

## 2015-03-30 DIAGNOSIS — Z794 Long term (current) use of insulin: Secondary | ICD-10-CM | POA: Diagnosis not present

## 2015-03-30 DIAGNOSIS — G8929 Other chronic pain: Secondary | ICD-10-CM | POA: Diagnosis present

## 2015-03-30 DIAGNOSIS — I739 Peripheral vascular disease, unspecified: Secondary | ICD-10-CM | POA: Diagnosis not present

## 2015-03-30 DIAGNOSIS — M6281 Muscle weakness (generalized): Secondary | ICD-10-CM | POA: Diagnosis not present

## 2015-03-30 DIAGNOSIS — I252 Old myocardial infarction: Secondary | ICD-10-CM

## 2015-03-30 DIAGNOSIS — Z7984 Long term (current) use of oral hypoglycemic drugs: Secondary | ICD-10-CM | POA: Diagnosis not present

## 2015-03-30 DIAGNOSIS — M179 Osteoarthritis of knee, unspecified: Secondary | ICD-10-CM | POA: Diagnosis not present

## 2015-03-30 DIAGNOSIS — R2689 Other abnormalities of gait and mobility: Secondary | ICD-10-CM | POA: Diagnosis not present

## 2015-03-30 DIAGNOSIS — K219 Gastro-esophageal reflux disease without esophagitis: Secondary | ICD-10-CM | POA: Diagnosis present

## 2015-03-30 DIAGNOSIS — M810 Age-related osteoporosis without current pathological fracture: Secondary | ICD-10-CM | POA: Diagnosis not present

## 2015-03-30 DIAGNOSIS — Z79810 Long term (current) use of selective estrogen receptor modulators (SERMs): Secondary | ICD-10-CM | POA: Diagnosis not present

## 2015-03-30 DIAGNOSIS — E785 Hyperlipidemia, unspecified: Secondary | ICD-10-CM | POA: Diagnosis present

## 2015-03-30 DIAGNOSIS — M1711 Unilateral primary osteoarthritis, right knee: Secondary | ICD-10-CM | POA: Diagnosis not present

## 2015-03-30 DIAGNOSIS — Z85820 Personal history of malignant melanoma of skin: Secondary | ICD-10-CM | POA: Diagnosis not present

## 2015-03-30 DIAGNOSIS — Z7982 Long term (current) use of aspirin: Secondary | ICD-10-CM

## 2015-03-30 DIAGNOSIS — Z96659 Presence of unspecified artificial knee joint: Secondary | ICD-10-CM

## 2015-03-30 DIAGNOSIS — I4892 Unspecified atrial flutter: Secondary | ICD-10-CM | POA: Diagnosis not present

## 2015-03-30 HISTORY — PX: KNEE ARTHROPLASTY: SHX992

## 2015-03-30 LAB — GLUCOSE, CAPILLARY
GLUCOSE-CAPILLARY: 175 mg/dL — AB (ref 65–99)
GLUCOSE-CAPILLARY: 127 mg/dL — AB (ref 65–99)

## 2015-03-30 SURGERY — ARTHROPLASTY, KNEE, TOTAL, USING IMAGELESS COMPUTER-ASSISTED NAVIGATION
Anesthesia: Monitor Anesthesia Care | Laterality: Right

## 2015-03-30 MED ORDER — LIDOCAINE HCL (CARDIAC) 20 MG/ML IV SOLN
INTRAVENOUS | Status: DC | PRN
  Administered 2015-03-30: 50 mg via INTRAVENOUS

## 2015-03-30 MED ORDER — TETRACAINE HCL 1 % IJ SOLN
INTRAMUSCULAR | Status: DC | PRN
  Administered 2015-03-30: 10 mg via INTRASPINAL

## 2015-03-30 MED ORDER — LOSARTAN POTASSIUM 50 MG PO TABS
100.0000 mg | ORAL_TABLET | Freq: Every day | ORAL | Status: DC
  Administered 2015-03-31 – 2015-04-02 (×3): 100 mg via ORAL
  Filled 2015-03-30 (×3): qty 2

## 2015-03-30 MED ORDER — SODIUM CHLORIDE 0.9 % IJ SOLN
INTRAMUSCULAR | Status: DC | PRN
  Administered 2015-03-30: 40 mL

## 2015-03-30 MED ORDER — MORPHINE SULFATE (PF) 2 MG/ML IV SOLN
2.0000 mg | INTRAVENOUS | Status: DC | PRN
  Administered 2015-03-31: 4 mg via INTRAVENOUS
  Administered 2015-03-31: 2 mg via INTRAVENOUS
  Administered 2015-03-31: 4 mg via INTRAVENOUS
  Administered 2015-03-31 (×4): 2 mg via INTRAVENOUS
  Administered 2015-04-01: 4 mg via INTRAVENOUS
  Filled 2015-03-30 (×3): qty 1
  Filled 2015-03-30 (×4): qty 2
  Filled 2015-03-30: qty 1

## 2015-03-30 MED ORDER — ALUM & MAG HYDROXIDE-SIMETH 200-200-20 MG/5ML PO SUSP: 30.0000 mL | ORAL | Status: DC | PRN

## 2015-03-30 MED ORDER — HYDROCHLOROTHIAZIDE 25 MG PO TABS
25.0000 mg | ORAL_TABLET | Freq: Every day | ORAL | Status: DC
  Administered 2015-04-02: 25 mg via ORAL
  Filled 2015-03-30 (×3): qty 1

## 2015-03-30 MED ORDER — FLEET ENEMA 7-19 GM/118ML RE ENEM: 1.0000 | ENEMA | Freq: Once | RECTAL | Status: DC | PRN

## 2015-03-30 MED ORDER — OMEGA-3-ACID ETHYL ESTERS 1 G PO CAPS
1.0000 g | ORAL_CAPSULE | Freq: Every day | ORAL | Status: DC
  Administered 2015-03-31 – 2015-04-02 (×3): 1 g via ORAL
  Filled 2015-03-30 (×3): qty 1

## 2015-03-30 MED ORDER — ADULT MULTIVITAMIN W/MINERALS CH
1.0000 | ORAL_TABLET | Freq: Every day | ORAL | Status: DC
  Administered 2015-03-31 – 2015-04-01 (×2): 1 via ORAL
  Filled 2015-03-30 (×3): qty 1

## 2015-03-30 MED ORDER — SODIUM CHLORIDE 0.9 % IV SOLN
INTRAVENOUS | Status: DC
  Administered 2015-03-30 (×3): via INTRAVENOUS

## 2015-03-30 MED ORDER — NEOMYCIN-POLYMYXIN B GU 40-200000 IR SOLN
Status: AC
  Filled 2015-03-30: qty 20

## 2015-03-30 MED ORDER — MAGNESIUM OXIDE 400 (241.3 MG) MG PO TABS
400.0000 mg | ORAL_TABLET | Freq: Every day | ORAL | Status: DC
  Administered 2015-03-31 – 2015-04-02 (×3): 400 mg via ORAL
  Filled 2015-03-30 (×3): qty 1

## 2015-03-30 MED ORDER — VITAMIN E 180 MG (400 UNIT) PO CAPS
400.0000 [IU] | ORAL_CAPSULE | Freq: Every morning | ORAL | Status: DC
  Administered 2015-03-31 – 2015-04-02 (×3): 400 [IU] via ORAL
  Filled 2015-03-30 (×3): qty 1

## 2015-03-30 MED ORDER — METOPROLOL SUCCINATE ER 25 MG PO TB24
25.0000 mg | ORAL_TABLET | Freq: Every day | ORAL | Status: DC
  Administered 2015-03-31 – 2015-04-02 (×3): 25 mg via ORAL
  Filled 2015-03-30 (×3): qty 1

## 2015-03-30 MED ORDER — ENOXAPARIN SODIUM 30 MG/0.3ML ~~LOC~~ SOLN
30.0000 mg | Freq: Two times a day (BID) | SUBCUTANEOUS | Status: DC
  Administered 2015-03-31 – 2015-04-02 (×5): 30 mg via SUBCUTANEOUS
  Filled 2015-03-30 (×5): qty 0.3

## 2015-03-30 MED ORDER — PROPYLENE GLYCOL 0.6 % OP SOLN: 1.0000 [drp] | OPHTHALMIC | Status: DC | PRN

## 2015-03-30 MED ORDER — FENTANYL CITRATE (PF) 100 MCG/2ML IJ SOLN: 25.0000 ug | INTRAMUSCULAR | Status: DC | PRN

## 2015-03-30 MED ORDER — METOCLOPRAMIDE HCL 10 MG PO TABS
10.0000 mg | ORAL_TABLET | Freq: Three times a day (TID) | ORAL | Status: AC
  Administered 2015-03-30 – 2015-04-01 (×8): 10 mg via ORAL
  Filled 2015-03-30 (×8): qty 1

## 2015-03-30 MED ORDER — BISACODYL 10 MG RE SUPP
10.0000 mg | Freq: Every day | RECTAL | Status: DC | PRN
  Administered 2015-04-02: 10 mg via RECTAL
  Filled 2015-03-30 (×2): qty 1

## 2015-03-30 MED ORDER — METAXALONE 800 MG PO TABS
800.0000 mg | ORAL_TABLET | Freq: Three times a day (TID) | ORAL | Status: DC
  Administered 2015-03-30 – 2015-04-01 (×6): 800 mg via ORAL
  Filled 2015-03-30 (×8): qty 1

## 2015-03-30 MED ORDER — NEOMYCIN-POLYMYXIN B GU 40-200000 IR SOLN
Status: DC | PRN
  Administered 2015-03-30: 16 mL

## 2015-03-30 MED ORDER — MAGNESIUM-POTASSIUM 250-100 MG PO TABS: ORAL_TABLET | Freq: Two times a day (BID) | ORAL | Status: DC

## 2015-03-30 MED ORDER — METFORMIN HCL ER 500 MG PO TB24
1000.0000 mg | ORAL_TABLET | Freq: Two times a day (BID) | ORAL | Status: DC
  Administered 2015-03-31 – 2015-04-02 (×5): 1000 mg via ORAL
  Filled 2015-03-30 (×5): qty 2

## 2015-03-30 MED ORDER — ACETAMINOPHEN 10 MG/ML IV SOLN
INTRAVENOUS | Status: DC | PRN
  Administered 2015-03-30: 1000 mg via INTRAVENOUS

## 2015-03-30 MED ORDER — VITAMIN C 500 MG PO TABS
500.0000 mg | ORAL_TABLET | Freq: Two times a day (BID) | ORAL | Status: DC
  Administered 2015-03-31 – 2015-04-02 (×5): 500 mg via ORAL
  Filled 2015-03-30 (×5): qty 1

## 2015-03-30 MED ORDER — ACETAMINOPHEN 10 MG/ML IV SOLN
1000.0000 mg | Freq: Four times a day (QID) | INTRAVENOUS | Status: AC
  Administered 2015-03-30 – 2015-03-31 (×3): 1000 mg via INTRAVENOUS
  Filled 2015-03-30 (×4): qty 100

## 2015-03-30 MED ORDER — SODIUM CHLORIDE 0.9 % IV SOLN
INTRAVENOUS | Status: DC | PRN
  Administered 2015-03-30: 60 mL

## 2015-03-30 MED ORDER — BUPIVACAINE HCL (PF) 0.5 % IJ SOLN
INTRAMUSCULAR | Status: DC | PRN
  Administered 2015-03-30 (×2): 2 mL

## 2015-03-30 MED ORDER — OXYCODONE HCL 5 MG/5ML PO SOLN: 5.0000 mg | Freq: Once | ORAL | Status: DC | PRN

## 2015-03-30 MED ORDER — DIPHENHYDRAMINE HCL 12.5 MG/5ML PO ELIX: 12.5000 mg | ORAL_SOLUTION | ORAL | Status: DC | PRN

## 2015-03-30 MED ORDER — PANTOPRAZOLE SODIUM 40 MG PO TBEC
40.0000 mg | DELAYED_RELEASE_TABLET | Freq: Two times a day (BID) | ORAL | Status: DC
  Administered 2015-03-30 – 2015-04-02 (×6): 40 mg via ORAL
  Filled 2015-03-30 (×6): qty 1

## 2015-03-30 MED ORDER — ONDANSETRON HCL 4 MG/2ML IJ SOLN
4.0000 mg | Freq: Four times a day (QID) | INTRAMUSCULAR | Status: DC | PRN
  Administered 2015-03-31: 4 mg via INTRAVENOUS
  Filled 2015-03-30: qty 2

## 2015-03-30 MED ORDER — ONDANSETRON HCL 4 MG PO TABS
4.0000 mg | ORAL_TABLET | Freq: Four times a day (QID) | ORAL | Status: DC | PRN
  Administered 2015-04-01: 4 mg via ORAL
  Filled 2015-03-30: qty 1

## 2015-03-30 MED ORDER — FENTANYL CITRATE (PF) 100 MCG/2ML IJ SOLN
INTRAMUSCULAR | Status: DC | PRN
  Administered 2015-03-30 (×2): 50 ug via INTRAVENOUS

## 2015-03-30 MED ORDER — BUPIVACAINE LIPOSOME 1.3 % IJ SUSP
INTRAMUSCULAR | Status: AC
  Filled 2015-03-30: qty 20

## 2015-03-30 MED ORDER — CEFAZOLIN SODIUM-DEXTROSE 2-3 GM-% IV SOLR
INTRAVENOUS | Status: AC
  Filled 2015-03-30: qty 50

## 2015-03-30 MED ORDER — MIDAZOLAM HCL 5 MG/5ML IJ SOLN
INTRAMUSCULAR | Status: DC | PRN
  Administered 2015-03-30: 2 mg via INTRAVENOUS

## 2015-03-30 MED ORDER — BUPIVACAINE-EPINEPHRINE 0.25% -1:200000 IJ SOLN
INTRAMUSCULAR | Status: DC | PRN
  Administered 2015-03-30: 30 mL

## 2015-03-30 MED ORDER — MAGNESIUM HYDROXIDE 400 MG/5ML PO SUSP
30.0000 mL | Freq: Every day | ORAL | Status: DC | PRN
  Administered 2015-04-01: 30 mL via ORAL
  Filled 2015-03-30: qty 30

## 2015-03-30 MED ORDER — SENNOSIDES-DOCUSATE SODIUM 8.6-50 MG PO TABS
1.0000 | ORAL_TABLET | Freq: Two times a day (BID) | ORAL | Status: DC
  Administered 2015-03-30 – 2015-04-02 (×6): 1 via ORAL
  Filled 2015-03-30 (×6): qty 1

## 2015-03-30 MED ORDER — VITAMIN D 1000 UNITS PO TABS
4000.0000 [IU] | ORAL_TABLET | Freq: Every day | ORAL | Status: DC
  Administered 2015-03-31 – 2015-04-02 (×3): 4000 [IU] via ORAL
  Filled 2015-03-30 (×3): qty 4

## 2015-03-30 MED ORDER — OCUVITE-LUTEIN PO CAPS
2.0000 | ORAL_CAPSULE | Freq: Two times a day (BID) | ORAL | Status: DC
  Administered 2015-03-31 – 2015-04-02 (×5): 2 via ORAL
  Filled 2015-03-30 (×6): qty 2

## 2015-03-30 MED ORDER — ACETAMINOPHEN 650 MG RE SUPP: 650.0000 mg | Freq: Four times a day (QID) | RECTAL | Status: DC | PRN

## 2015-03-30 MED ORDER — POTASSIUM CHLORIDE CRYS ER 20 MEQ PO TBCR
20.0000 meq | EXTENDED_RELEASE_TABLET | Freq: Every day | ORAL | Status: DC
  Administered 2015-03-31 – 2015-04-02 (×3): 20 meq via ORAL
  Filled 2015-03-30 (×3): qty 1

## 2015-03-30 MED ORDER — ONE-DAILY MULTI VITAMINS PO TABS: 1.0000 | ORAL_TABLET | Freq: Every day | ORAL | Status: DC

## 2015-03-30 MED ORDER — INSULIN ASPART 100 UNIT/ML ~~LOC~~ SOLN
0.0000 [IU] | Freq: Three times a day (TID) | SUBCUTANEOUS | Status: DC
  Administered 2015-03-31: 8 [IU] via SUBCUTANEOUS
  Administered 2015-03-31: 11 [IU] via SUBCUTANEOUS
  Administered 2015-03-31: 2 [IU] via SUBCUTANEOUS
  Administered 2015-04-01 (×2): 8 [IU] via SUBCUTANEOUS
  Administered 2015-04-01: 3 [IU] via SUBCUTANEOUS
  Administered 2015-04-02: 5 [IU] via SUBCUTANEOUS
  Filled 2015-03-30: qty 5
  Filled 2015-03-30: qty 3
  Filled 2015-03-30: qty 11
  Filled 2015-03-30: qty 5
  Filled 2015-03-30: qty 8
  Filled 2015-03-30: qty 2
  Filled 2015-03-30: qty 8
  Filled 2015-03-30: qty 3

## 2015-03-30 MED ORDER — OXYCODONE HCL 5 MG PO TABS
5.0000 mg | ORAL_TABLET | ORAL | Status: DC | PRN
  Administered 2015-03-30 (×2): 5 mg via ORAL
  Administered 2015-03-31 – 2015-04-01 (×8): 10 mg via ORAL
  Filled 2015-03-30: qty 2
  Filled 2015-03-30: qty 1
  Filled 2015-03-30 (×3): qty 2
  Filled 2015-03-30: qty 1
  Filled 2015-03-30 (×4): qty 2

## 2015-03-30 MED ORDER — SODIUM CHLORIDE 0.9 % IV SOLN: Freq: Once | INTRAVENOUS | Status: DC

## 2015-03-30 MED ORDER — ACETAMINOPHEN 10 MG/ML IV SOLN
INTRAVENOUS | Status: AC
  Filled 2015-03-30: qty 100

## 2015-03-30 MED ORDER — OXYCODONE HCL 5 MG PO TABS: 5.0000 mg | ORAL_TABLET | Freq: Once | ORAL | Status: DC | PRN

## 2015-03-30 MED ORDER — PROPOFOL 10 MG/ML IV BOLUS
INTRAVENOUS | Status: DC | PRN
  Administered 2015-03-30 (×3): 20 mg via INTRAVENOUS

## 2015-03-30 MED ORDER — MENTHOL 3 MG MT LOZG: 1.0000 | LOZENGE | OROMUCOSAL | Status: DC | PRN

## 2015-03-30 MED ORDER — SULFACETAMIDE SODIUM 10 % OP SOLN
1.0000 [drp] | Freq: Three times a day (TID) | OPHTHALMIC | Status: DC | PRN
  Filled 2015-03-30: qty 15

## 2015-03-30 MED ORDER — CEFAZOLIN SODIUM-DEXTROSE 2-3 GM-% IV SOLR
2.0000 g | Freq: Once | INTRAVENOUS | Status: AC
  Administered 2015-03-30: 2 g via INTRAVENOUS

## 2015-03-30 MED ORDER — GLYCOPYRROLATE 0.2 MG/ML IJ SOLN
INTRAMUSCULAR | Status: DC | PRN
  Administered 2015-03-30: 0.2 mg via INTRAVENOUS

## 2015-03-30 MED ORDER — PHENOL 1.4 % MT LIQD: 1.0000 | OROMUCOSAL | Status: DC | PRN

## 2015-03-30 MED ORDER — RALOXIFENE HCL 60 MG PO TABS
60.0000 mg | ORAL_TABLET | Freq: Every day | ORAL | Status: DC
Start: 2015-03-30 — End: 2015-04-02
  Administered 2015-03-31 – 2015-04-02 (×3): 60 mg via ORAL
  Filled 2015-03-30 (×3): qty 1

## 2015-03-30 MED ORDER — SODIUM CHLORIDE 0.9 % IV SOLN
INTRAVENOUS | Status: DC
Start: 2015-03-30 — End: 2015-03-31
  Administered 2015-03-31 (×2): via INTRAVENOUS

## 2015-03-30 MED ORDER — POLYVINYL ALCOHOL 1.4 % OP SOLN
1.0000 [drp] | OPHTHALMIC | Status: DC | PRN
  Administered 2015-03-30 – 2015-03-31 (×2): 1 [drp] via OPHTHALMIC
  Filled 2015-03-30: qty 15

## 2015-03-30 MED ORDER — CEFAZOLIN SODIUM-DEXTROSE 2-3 GM-% IV SOLR
2.0000 g | Freq: Four times a day (QID) | INTRAVENOUS | Status: AC
  Administered 2015-03-30 – 2015-03-31 (×4): 2 g via INTRAVENOUS
  Filled 2015-03-30 (×4): qty 50

## 2015-03-30 MED ORDER — INSULIN GLARGINE 100 UNIT/ML ~~LOC~~ SOLN
28.0000 [IU] | Freq: Every day | SUBCUTANEOUS | Status: DC
  Administered 2015-03-31 – 2015-04-01 (×2): 28 [IU] via SUBCUTANEOUS
  Filled 2015-03-30 (×4): qty 0.28

## 2015-03-30 MED ORDER — FLECAINIDE ACETATE 50 MG PO TABS
50.0000 mg | ORAL_TABLET | Freq: Two times a day (BID) | ORAL | Status: DC
  Administered 2015-03-30 – 2015-04-02 (×6): 50 mg via ORAL
  Filled 2015-03-30 (×7): qty 1

## 2015-03-30 MED ORDER — LOSARTAN POTASSIUM-HCTZ 100-25 MG PO TABS: 1.0000 | ORAL_TABLET | Freq: Every day | ORAL | Status: DC

## 2015-03-30 MED ORDER — SODIUM CHLORIDE 0.9 % IJ SOLN
INTRAMUSCULAR | Status: AC
  Filled 2015-03-30: qty 50

## 2015-03-30 MED ORDER — PROPOFOL 500 MG/50ML IV EMUL
INTRAVENOUS | Status: DC | PRN
  Administered 2015-03-30: 60 ug/kg/min via INTRAVENOUS

## 2015-03-30 MED ORDER — TETRACAINE HCL 1 % IJ SOLN
INTRAMUSCULAR | Status: AC
  Filled 2015-03-30: qty 2

## 2015-03-30 MED ORDER — LIRAGLUTIDE 18 MG/3ML ~~LOC~~ SOPN
1.2000 mg | PEN_INJECTOR | Freq: Every day | SUBCUTANEOUS | Status: DC
  Administered 2015-04-01 – 2015-04-02 (×2): 1.2 mg via SUBCUTANEOUS
  Filled 2015-03-30 (×2): qty 3

## 2015-03-30 MED ORDER — ESTRADIOL 0.1 MG/GM VA CREA
1.0000 | TOPICAL_CREAM | VAGINAL | Status: DC
  Filled 2015-03-30: qty 42.5

## 2015-03-30 MED ORDER — TRAMADOL HCL 50 MG PO TABS
50.0000 mg | ORAL_TABLET | ORAL | Status: DC | PRN
  Administered 2015-03-31 (×2): 50 mg via ORAL
  Administered 2015-04-01: 100 mg via ORAL
  Administered 2015-04-01: 50 mg via ORAL
  Administered 2015-04-01 – 2015-04-02 (×3): 100 mg via ORAL
  Filled 2015-03-30: qty 2
  Filled 2015-03-30 (×2): qty 1
  Filled 2015-03-30: qty 2
  Filled 2015-03-30: qty 1
  Filled 2015-03-30 (×2): qty 2

## 2015-03-30 MED ORDER — EPHEDRINE SULFATE 50 MG/ML IJ SOLN
INTRAMUSCULAR | Status: DC | PRN
  Administered 2015-03-30 (×2): 10 mg via INTRAVENOUS

## 2015-03-30 MED ORDER — ROSUVASTATIN CALCIUM 5 MG PO TABS
10.0000 mg | ORAL_TABLET | Freq: Every day | ORAL | Status: DC
  Administered 2015-03-31 – 2015-04-02 (×3): 10 mg via ORAL
  Filled 2015-03-30 (×5): qty 2

## 2015-03-30 MED ORDER — FERROUS SULFATE 325 (65 FE) MG PO TABS
325.0000 mg | ORAL_TABLET | Freq: Two times a day (BID) | ORAL | Status: DC
  Administered 2015-03-31 – 2015-04-02 (×5): 325 mg via ORAL
  Filled 2015-03-30 (×5): qty 1

## 2015-03-30 MED ORDER — BACID PO TABS
ORAL_TABLET | Freq: Every day | ORAL | Status: DC
  Administered 2015-03-31 – 2015-04-01 (×2): 1 via ORAL
  Filled 2015-03-30 (×4): qty 1

## 2015-03-30 MED ORDER — BUPIVACAINE-EPINEPHRINE (PF) 0.25% -1:200000 IJ SOLN
INTRAMUSCULAR | Status: AC
  Filled 2015-03-30: qty 30

## 2015-03-30 MED ORDER — ACETAMINOPHEN 325 MG PO TABS: 650.0000 mg | ORAL_TABLET | Freq: Four times a day (QID) | ORAL | Status: DC | PRN

## 2015-03-30 SURGICAL SUPPLY — 67 items
AUTOTRANSFUS HAS 1/8 (MISCELLANEOUS) ×2
BATTERY INSTRU NAVIGATION (MISCELLANEOUS) ×8 IMPLANT
BLADE SAW 1 (BLADE) ×2 IMPLANT
BLADE SAW 1/2 (BLADE) ×2 IMPLANT
BONE CEMENT GENTAMICIN (Cement) ×4 IMPLANT
CANISTER SUCT 1200ML W/VALVE (MISCELLANEOUS) ×2 IMPLANT
CANISTER SUCT 3000ML (MISCELLANEOUS) ×4 IMPLANT
CAP KNEE TOTAL 3 SIGMA ×2 IMPLANT
CATH TRAY METER 16FR LF (MISCELLANEOUS) ×2 IMPLANT
CEMENT BONE GENTAMICIN 40 (Cement) ×2 IMPLANT
COOLER POLAR GLACIER W/PUMP (MISCELLANEOUS) ×2 IMPLANT
DECANTER SPIKE VIAL GLASS SM (MISCELLANEOUS) ×4 IMPLANT
DRAPE SHEET LG 3/4 BI-LAMINATE (DRAPES) ×4 IMPLANT
DRSG DERMACEA 8X12 NADH (GAUZE/BANDAGES/DRESSINGS) ×2 IMPLANT
DRSG OPSITE POSTOP 4X14 (GAUZE/BANDAGES/DRESSINGS) ×2 IMPLANT
DRSG TEGADERM 4X4.75 (GAUZE/BANDAGES/DRESSINGS) ×2 IMPLANT
DURAPREP 26ML APPLICATOR (WOUND CARE) ×4 IMPLANT
ELECT CAUTERY BLADE 6.4 (BLADE) ×2 IMPLANT
EX-PIN ORTHOLOCK NAV 4X150 (PIN) ×4 IMPLANT
GLOVE BIO SURGEON STRL SZ7.5 (GLOVE) ×6 IMPLANT
GLOVE BIOGEL M STRL SZ7.5 (GLOVE) ×4 IMPLANT
GLOVE BIOGEL PI IND STRL 7.0 (GLOVE) ×1 IMPLANT
GLOVE BIOGEL PI IND STRL 7.5 (GLOVE) ×1 IMPLANT
GLOVE BIOGEL PI IND STRL 9 (GLOVE) ×1 IMPLANT
GLOVE BIOGEL PI INDICATOR 7.0 (GLOVE) ×1
GLOVE BIOGEL PI INDICATOR 7.5 (GLOVE) ×1
GLOVE BIOGEL PI INDICATOR 9 (GLOVE) ×1
GLOVE INDICATOR 8.0 STRL GRN (GLOVE) ×2 IMPLANT
GLOVE SURG 9.0 ORTHO LTXF (GLOVE) ×2 IMPLANT
GLOVE SURG ORTHO 9.0 STRL STRW (GLOVE) ×4 IMPLANT
GOWN STRL REUS W/ TWL LRG LVL3 (GOWN DISPOSABLE) ×1 IMPLANT
GOWN STRL REUS W/ TWL LRG LVL4 (GOWN DISPOSABLE) ×2 IMPLANT
GOWN STRL REUS W/TWL 2XL LVL3 (GOWN DISPOSABLE) ×2 IMPLANT
GOWN STRL REUS W/TWL LRG LVL3 (GOWN DISPOSABLE) ×1
GOWN STRL REUS W/TWL LRG LVL4 (GOWN DISPOSABLE) ×2
GOWN STRL REUS W/TWL XL LVL4 (GOWN DISPOSABLE) ×2 IMPLANT
HANDPIECE SUCTION TUBG SURGILV (MISCELLANEOUS) ×2 IMPLANT
HOLDER FOLEY CATH W/STRAP (MISCELLANEOUS) ×2 IMPLANT
HOOD PEEL AWAY FACE SHEILD DIS (HOOD) ×4 IMPLANT
IV SODIUM CHLORIDE IVPB 50ML (IV SOLUTION) IMPLANT
KIT RM TURNOVER STRD PROC AR (KITS) ×2 IMPLANT
KNIFE SCULPS 14X20 (INSTRUMENTS) ×2 IMPLANT
NDL SAFETY 18GX1.5 (NEEDLE) ×2 IMPLANT
NEEDLE SPNL 20GX3.5 QUINCKE YW (NEEDLE) ×2 IMPLANT
NS IRRIG 1000ML POUR BTL (IV SOLUTION) ×2 IMPLANT
NS IRRIG 500ML POUR BTL (IV SOLUTION) IMPLANT
PACK TOTAL KNEE (MISCELLANEOUS) ×2 IMPLANT
PAD GROUND ADULT SPLIT (MISCELLANEOUS) ×2 IMPLANT
PAD WRAPON POLAR KNEE (MISCELLANEOUS) ×1 IMPLANT
PIN FIXATION 1/8DIA X 3INL (PIN) ×2 IMPLANT
SOL .9 NS 3000ML IRR  AL (IV SOLUTION) ×1
SOL .9 NS 3000ML IRR UROMATIC (IV SOLUTION) ×1 IMPLANT
SOL PREP PVP 2OZ (MISCELLANEOUS) ×2
SOLUTION PREP PVP 2OZ (MISCELLANEOUS) ×1 IMPLANT
SPONGE DRAIN TRACH 4X4 STRL 2S (GAUZE/BANDAGES/DRESSINGS) ×4 IMPLANT
STAPLER SKIN PROX 35W (STAPLE) ×2 IMPLANT
SUCTION FRAZIER TIP 10 FR DISP (SUCTIONS) ×2 IMPLANT
SUT VIC AB 0 CT1 36 (SUTURE) ×2 IMPLANT
SUT VIC AB 1 CT1 36 (SUTURE) ×4 IMPLANT
SUT VIC AB 2-0 CT2 27 (SUTURE) ×2 IMPLANT
SYR 20CC LL (SYRINGE) ×2 IMPLANT
SYR 30ML LL (SYRINGE) ×2 IMPLANT
SYR 50ML LL SCALE MARK (SYRINGE) ×2 IMPLANT
SYSTEM AUTOTRANSFUS DUAL TROCR (MISCELLANEOUS) ×1 IMPLANT
TOWEL OR 17X26 4PK STRL BLUE (TOWEL DISPOSABLE) IMPLANT
TOWER CARTRIDGE SMART MIX (DISPOSABLE) ×2 IMPLANT
WRAPON POLAR PAD KNEE (MISCELLANEOUS) ×2

## 2015-03-30 NOTE — Brief Op Note (Signed)
03/30/2015  7:34 PM  PATIENT:  Bonnie Rowland  74 y.o. female  PRE-OPERATIVE DIAGNOSIS:  DEGENERATIVE ARTHRITIS RIGHT KNEE  POST-OPERATIVE DIAGNOSIS:  degenerative arthritis right knee  PROCEDURE:  Procedure(s): COMPUTER ASSISTED TOTAL KNEE ARTHROPLASTY (Right)  SURGEON:  Surgeon(s) and Role:    * Dereck Leep, MD - Primary  ASSISTANTS: Vance Peper, PA    ANESTHESIA:   spinal  EBL:    50 ml  BLOOD ADMINISTERED:none  DRAINS: 2 drains to reinfusion system   LOCAL MEDICATIONS USED:  MARCAINE    and OTHER Exparel  SPECIMEN:  No Specimen  DISPOSITION OF SPECIMEN:  N/A  COUNTS:  YES  TOURNIQUET:   Total Tourniquet Time Documented: Thigh (Right) - 86 minutes Total: Thigh (Right) - 86 minutes   DICTATION: .Viviann Spare Dictation  PLAN OF CARE: Admit to inpatient   PATIENT DISPOSITION:  PACU - hemodynamically stable.   Delay start of Pharmacological VTE agent (>24hrs) due to surgical blood loss or risk of bleeding: yes

## 2015-03-30 NOTE — H&P (Signed)
The patient has been re-examined, and the chart reviewed, and there have been no interval changes to the documented history and physical.    The risks, benefits, and alternatives have been discussed at length. The patient expressed understanding of the risks benefits and agreed with plans for surgical intervention.  James P. Hooten, Jr. M.D.    

## 2015-03-30 NOTE — Anesthesia Procedure Notes (Addendum)
Spinal Patient location during procedure: OR Start time: 03/30/2015 3:45 PM End time: 03/30/2015 4:00 PM Staffing Anesthesiologist: Martha Clan Resident/CRNA: Doreen Salvage Performed by: anesthesiologist and resident/CRNA  Preanesthetic Checklist Completed: patient identified, site marked, surgical consent, pre-op evaluation, timeout performed, IV checked, risks and benefits discussed and monitors and equipment checked Spinal Block Patient position: sitting Prep: Betadine Patient monitoring: heart rate, continuous pulse ox, blood pressure and cardiac monitor Approach: midline Location: L3-4 Injection technique: single-shot Needle Needle type: Whitacre and Introducer  Needle gauge: 24 G Needle length: 9 cm Additional Notes Negative paresthesia. Negative blood return. Positive free-flowing CSF. Expiration date of kit checked and confirmed. Patient tolerated procedure well, without complications.    Date/Time: 03/30/2015 3:36 PM Performed by: Doreen Salvage Pre-anesthesia Checklist: Patient identified, Emergency Drugs available, Suction available and Patient being monitored Patient Re-evaluated:Patient Re-evaluated prior to inductionOxygen Delivery Method: Simple face mask Dental Injury: Teeth and Oropharynx as per pre-operative assessment

## 2015-03-30 NOTE — Op Note (Signed)
OPERATIVE NOTE  DATE OF SURGERY:  03/30/2015  PATIENT NAME:  Bonnie Rowland   DOB: 01/12/1941  MRN: [id number]  PRE-OPERATIVE DIAGNOSIS: Degenerative arthrosis of the right knee, primary  POST-OPERATIVE DIAGNOSIS:  Same  PROCEDURE:  Right total knee arthroplasty using computer-assisted navigation  SURGEON:  Marciano Sequin. M.D.  ASSISTANT:  Vance Peper, PA (present and scrubbed throughout the case, critical for assistance with exposure, retraction, instrumentation, and closure)  ANESTHESIA: spinal  ESTIMATED BLOOD LOSS: 50 mL  FLUIDS REPLACED: 2000 mL of crystalloid  TOURNIQUET TIME: 84 minutes  DRAINS: 2 medium drains to a reinfusion system  SOFT TISSUE RELEASES: Anterior cruciate ligament, posterior cruciate ligament, deep medial collateral ligament, patellofemoral ligament   IMPLANTS UTILIZED: DePuy PFC Sigma size 2.5 posterior stabilized femoral component (cemented), size 2.5 MBT tibial component (cemented), 35 mm 3 peg oval dome patella (cemented), and a 10 mm stabilized rotating platform polyethylene insert.  INDICATIONS FOR SURGERY: Bonnie Rowland is a 74 y.o. year old female with a long history of progressive knee pain. X-rays demonstrated severe degenerative changes in tricompartmental fashion. The patient had not seen any significant improvement despite conservative nonsurgical intervention. After discussion of the risks and benefits of surgical intervention, the patient expressed understanding of the risks benefits and agree with plans for total knee arthroplasty.   The risks, benefits, and alternatives were discussed at length including but not limited to the risks of infection, bleeding, nerve injury, stiffness, blood clots, the need for revision surgery, cardiopulmonary complications, among others, and they were willing to proceed.  PROCEDURE IN DETAIL: The patient was brought into the operating room and, after adequate spinal anesthesia was achieved, a  tourniquet was placed on the patient's upper thigh. The patient's knee and leg were cleaned and prepped with alcohol and DuraPrep and draped in the usual sterile fashion. A "timeout" was performed as per usual protocol. The lower extremity was exsanguinated using an Esmarch, and the tourniquet was inflated to 300 mmHg. An anterior longitudinal incision was made followed by a standard mid vastus approach. The deep fibers of the medial collateral ligament were elevated in a subperiosteal fashion off of the medial flare of the tibia so as to maintain a continuous soft tissue sleeve. The patella was subluxed laterally and the patellofemoral ligament was incised. Inspection of the knee demonstrated severe degenerative changes with full-thickness loss of articular cartilage. Osteophytes were debrided using a rongeur. Anterior and posterior cruciate ligaments were excised. Two 4.0 mm Schanz pins were inserted in the femur and into the tibia for attachment of the array of trackers used for computer-assisted navigation. Hip center was identified using a circumduction technique. Distal landmarks were mapped using the computer. The distal femur and proximal tibia were mapped using the computer. The distal femoral cutting guide was positioned using computer-assisted navigation so as to achieve a 5 distal valgus cut. The femur was sized and it was felt that a size 2.5 femoral component was appropriate. A size 2.5 femoral cutting guide was positioned and the anterior cut was performed and verified using the computer. This was followed by completion of the posterior and chamfer cuts. Femoral cutting guide for the central box was then positioned in the center box cut was performed.  Attention was then directed to the proximal tibia. Medial and lateral menisci were excised. The extramedullary tibial cutting guide was positioned using computer-assisted navigation so as to achieve a 0 varus-valgus alignment and 0 posterior slope.  The cut was performed and verified  using the computer. The proximal tibia was sized and it was felt that a size 2.5 tibial tray was appropriate. Tibial and femoral trials were inserted followed by insertion of a 10 mm polyethylene insert.This allowed for excellent mediolateral soft tissue balancing both in flexion and in full extension. Finally, the patella was cut and prepared so as to accommodate a 35 mm 3 peg oval dome patella. A patella trial was placed and the knee was placed through a range of motion with excellent patellar tracking appreciated. The femoral trial was removed after debridement of posterior osteophytes. The central post-hole for the tibial component was reamed followed by insertion of a keel punch. Tibial trials were then removed. Cut surfaces of bone were irrigated with copious amounts of normal saline with antibiotic solution using pulsatile lavage and then suctioned dry. Polymethylmethacrylate cement with gentamicin was prepared in the usual fashion using a vacuum mixer. Cement was applied to the cut surface of the proximal tibia as well as along the undersurface of a size 2.5 MBT tibial component. Tibial component was positioned and impacted into place. Excess cement was removed using [employer] fast streamer. Cement was then applied to the cut surfaces of the femur as well as along the posterior flanges of the size 2.5 femoral component. The femoral component was positioned and impacted into place. Excess cement was removed using [employer] fast streamer. A 10 mm polyethylene trial was inserted and the knee was brought into full extension with steady axial compression applied. Finally, cement was applied to the backside of a 35 mm 3 peg oval dome patella and the patellar component was positioned and patellar clamp applied. Excess cement was removed using [employer] fast streamer. After adequate curing of the cement, the tourniquet was deflated after a total tourniquet time of 84 minutes. Hemostasis was achieved using  electrocautery. The knee was irrigated with copious amounts of normal saline with antibiotic solution using pulsatile lavage and then suctioned dry. 20 mL of 1.3% Exparel in 40 mL of normal saline was injected along the posterior capsule, medial and lateral gutters, and along the arthrotomy site. A 10 mm stabilized rotating platform polyethylene insert was inserted and the knee was placed through a range of motion with excellent mediolateral soft tissue balancing appreciated and excellent patellar tracking noted. 2 medium drains were placed in the wound bed and brought out through separate stab incisions to be attached to a reinfusion system. The medial parapatellar portion of the incision was reapproximated using interrupted sutures of #1 Vicryl. Subcutaneous tissue was then injected with a total of 30 cc of 0.25% Marcaine with epinephrine. Subcutaneous tissue was approximated in layers using first #0 Vicryl followed #2-0 Vicryl. The skin was approximated with skin staples. A sterile dressing was applied.  The patient tolerated the procedure well and was transported to the recovery room in stable condition.    James P. Holley Bouche., M.D.

## 2015-03-30 NOTE — Anesthesia Preprocedure Evaluation (Addendum)
Anesthesia Evaluation  Patient identified by MRN, date of birth, ID band Patient awake    Reviewed: Allergy & Precautions, H&P , NPO status , Patient's Chart, lab work & pertinent test results  History of Anesthesia Complications Negative for: history of anesthetic complications  Airway Mallampati: III  TM Distance: >3 FB Neck ROM: limited    Dental no notable dental hx. (+) Poor Dentition, Chipped   Pulmonary neg pulmonary ROS, neg shortness of breath,    Pulmonary exam normal breath sounds clear to auscultation       Cardiovascular Exercise Tolerance: Good hypertension, + Peripheral Vascular Disease  (-) Past MI Normal cardiovascular exam Rhythm:regular Rate:Normal     Neuro/Psych  Neuromuscular disease negative psych ROS   GI/Hepatic Neg liver ROS, GERD  Controlled,  Endo/Other  diabetes, Type 2  Renal/GU CRFRenal disease  negative genitourinary   Musculoskeletal  (+) Arthritis ,   Abdominal   Peds  Hematology negative hematology ROS (+)   Anesthesia Other Findings Past Medical History:   Hyperlipidemia                                               Hypertension                                                 Sprain of lumbar region                                      Thoracic or lumbosacral neuritis or radiculiti*              Allergic rhinitis, cause unspecified                         Diabetes mellitus                                              Comment:type II   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)                                Dermatophytosis of nail                                      Esophageal reflux                                            Cellulitis and abscess of leg, except foot                   Urinary tract infection, site not specified                  Acute cystitis  Conjunctivitis unspecified                                   Bronchitis, not  specified as acute or chronic                Cancer                                          12/2013         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; left shoulder blade   Cancer                                          05/2014        Comment:basal cell removed left temple   Renal artery stenosis                                        Atherosclerosis of renal artery                                Comment:left   Reproductive/Obstetrics negative OB ROS                            Anesthesia Physical Anesthesia Plan  ASA: III  Anesthesia Plan: MAC and Spinal   Post-op Pain Management:    Induction:   Airway Management Planned:   Additional Equipment:   Intra-op Plan:   Post-operative Plan:   Informed Consent: I have reviewed the patients History and Physical, chart, labs and discussed the procedure including the risks, benefits and alternatives for the proposed anesthesia with the patient or authorized representative who has indicated his/her understanding and acceptance.   Dental Advisory Given  Plan Discussed with: Anesthesiologist, CRNA and Surgeon  Anesthesia Plan Comments:         Anesthesia Quick Evaluation

## 2015-03-30 NOTE — Op Note (Signed)
Anesthesia notified of of fsbs. No new orders received.

## 2015-03-30 NOTE — Progress Notes (Signed)
First autovac transfusion hung with blood output being 500cc.

## 2015-03-30 NOTE — Transfer of Care (Signed)
Immediate Anesthesia Transfer of Care Note  Patient: Bonnie Rowland  Procedure(s) Performed: Procedure(s): COMPUTER ASSISTED TOTAL KNEE ARTHROPLASTY (Right)  Patient Location: PACU  Anesthesia Type:Spinal  Level of Consciousness: sedated  Airway & Oxygen Therapy: Patient Spontanous Breathing and Patient connected to face mask oxygen  Post-op Assessment: Report given to RN and Post -op Vital signs reviewed and stable  Post vital signs: Reviewed and stable  Last Vitals:  Filed Vitals:   03/30/15 1454  BP: 187/60  Pulse: 68  Temp:   Resp: 16    Complications: No apparent anesthesia complications

## 2015-03-31 ENCOUNTER — Encounter: Payer: Self-pay | Admitting: Orthopedic Surgery

## 2015-03-31 ENCOUNTER — Encounter
Admission: RE | Admit: 2015-03-31 | Discharge: 2015-03-31 | Disposition: A | Discharge status: Home / Self Care | Payer: BLUE CROSS/BLUE SHIELD | Source: Ambulatory Visit | Attending: Internal Medicine | Admitting: Internal Medicine

## 2015-03-31 LAB — CBC
HCT: 33.6 % — ABNORMAL LOW (ref 35.0–47.0)
HEMOGLOBIN: 11.2 g/dL — AB (ref 12.0–16.0)
MCH: 31 pg (ref 26.0–34.0)
MCHC: 33.4 g/dL (ref 32.0–36.0)
MCV: 92.9 fL (ref 80.0–100.0)
Platelets: 166 10*3/uL (ref 150–440)
RBC: 3.61 MIL/uL — AB (ref 3.80–5.20)
RDW: 13.2 % (ref 11.5–14.5)
WBC: 12.9 10*3/uL — AB (ref 3.6–11.0)

## 2015-03-31 LAB — GLUCOSE, CAPILLARY
GLUCOSE-CAPILLARY: 290 mg/dL — AB (ref 65–99)
GLUCOSE-CAPILLARY: 144 mg/dL — AB (ref 65–99)
GLUCOSE-CAPILLARY: 136 mg/dL — AB (ref 65–99)
Glucose-Capillary: 313 mg/dL — ABNORMAL HIGH (ref 65–99)
Glucose-Capillary: 321 mg/dL — ABNORMAL HIGH (ref 65–99)
Glucose-Capillary: 159 mg/dL — ABNORMAL HIGH (ref 65–99)

## 2015-03-31 LAB — BASIC METABOLIC PANEL
ANION GAP: 9 (ref 5–15)
BUN: 9 mg/dL (ref 6–20)
CHLORIDE: 102 mmol/L (ref 101–111)
CO2: 24 mmol/L (ref 22–32)
Calcium: 7.7 mg/dL — ABNORMAL LOW (ref 8.9–10.3)
Creatinine, Ser: 0.65 mg/dL (ref 0.44–1.00)
GFR calc non Af Amer: >60 mL/min (ref >60)
Glucose, Bld: 313 mg/dL — ABNORMAL HIGH (ref 65–99)
POTASSIUM: 3.5 mmol/L (ref 3.5–5.1)
SODIUM: 135 mmol/L (ref 135–145)

## 2015-03-31 MED ORDER — POTASSIUM CHLORIDE CRYS ER 20 MEQ PO TBCR
20.0000 meq | EXTENDED_RELEASE_TABLET | Freq: Once | ORAL | Status: AC
  Administered 2015-03-31: 20 meq via ORAL
  Filled 2015-03-31: qty 1

## 2015-03-31 NOTE — Progress Notes (Addendum)
Physical Therapy Treatment Patient Details Name: Bonnie Rowland MRN: [id number] DOB: 05-21-1941 Today's Date: 03/31/2015    History of Present Illness This paitent is a 74 year old female who came to [facility] for a R TKR.    PT Comments    Pt was able to demonstrate good capacity for functional movement during this afternoon's treatment session. All sit<>stand transfers were min assist with patient encouraged to accept greater percentage of weight through her RLE. Pt ambulated ~25 feet in her room using a RW and min assist from PT. Pt demonstrated good weight bearing symmetry between her R/L LEs which helped her achieve good step and stride lengths. Pt will require encouragement tomorrow to promote proper heel strike and concentrate on achieving greater knee flexion ROM throughout the gait cycle. Pt was able to transfer to her raised toilet seat during session and successfully voided before pt was returned to her bed. Pt will continue to benefit from skilled acute PT services in order to increase her strength, ROM, and overall functional mobility.  Follow Up Recommendations  SNF     Equipment Recommendations       Recommendations for Other Services       Precautions / Restrictions Precautions Precautions: Fall Restrictions Weight Bearing Restrictions: No Other Position/Activity Restrictions: WBAT    Mobility  Bed Mobility Overal bed mobility: Needs Assistance Bed Mobility: Sit to Supine     Supine to sit: Min assist Sit to supine: Min assist   General bed mobility comments: Pt able to control LLE when getting into bed but required min assist from PT for control of RLE.   Transfers Overall transfer level: Needs assistance Equipment used: Rolling walker (2 wheeled) Transfers: Sit to/from Stand Sit to Stand: Min assist         General transfer comment: Pt continues to demonstrate proficiency with sit <>stand transfer but still requires min assist to come to standing. Pt  will benfit from having her bandaging removed in order to promote greater R knee flexion and more equal weight distributon during transfers.  Ambulation/Gait Ambulation/Gait assistance: Min assist Ambulation Distance (Feet): 25 Feet Assistive device: Rolling walker (2 wheeled) Gait Pattern/deviations: Antalgic;Step-through pattern;Decreased stride length   Gait velocity interpretation: <1.8 ft/sec, indicative of risk for recurrent falls General Gait Details: Pt demonstrated good ability to perform gait with adequate stride length and equal step length. Displays weight bearing through the RLE that is near equal to the L.    Stairs            Wheelchair Mobility    Modified Rankin (Stroke Patients Only)       Balance Overall balance assessment: Needs assistance Sitting-balance support: No upper extremity supported;Feet supported Sitting balance-Leahy Scale: Good     Standing balance support: Bilateral upper extremity supported (anterior trunk lean) Standing balance-Leahy Scale: Fair                      Cognition Arousal/Alertness: Awake/alert Behavior During Therapy: WFL for tasks assessed/performed Overall Cognitive Status: Within Functional Limits for tasks assessed                      Exercises Total Joint Exercises Ankle Circles/Pumps: AROM;15 reps;Both;Seated Quad Sets: Strengthening;10 reps;Both;Seated Gluteal Sets: Strengthening;Both;10 reps;Seated Towel Squeeze: Strengthening;Both;10 reps;Seated Heel Slides: AROM;Right;10 reps;Seated Straight Leg Raises: Strengthening;Right;10 reps;Seated Other Exercises Other Exercises: Pt performed stand<>sit transfer to and from her raised toilet seat in bathroom w/ min assist and BUE support  on RW    General Comments        Pertinent Vitals/Pain Pain Assessment: 0-10 Pain Score: 5  Pain Location: R knee Pain Intervention(s): Monitored during session;Premedicated before session;Repositioned;Ice  applied    Home Living Family/patient expects to be discharged to:: Private residence Living Arrangements: Spouse/significant other Available Help at Discharge: Family Type of Home: House Home Access: Stairs to enter   Home Layout:  (ranch with a basement.) Home Equipment: Environmental consultant - 2 wheels;Walker - 4 wheels;Cane - single point      Prior Function Level of Independence: Independent      Comments: works half time at [employer] (current goals can now be found in the care plan section) Acute Rehab PT Goals Patient Stated Goal: Pt wants to go home PT Goal Formulation: With patient Time For Goal Achievement: 04/14/15 Potential to Achieve Goals: Good Progress towards PT goals: Progressing toward goals    Frequency  BID    PT Plan Current plan remains appropriate    Co-evaluation             End of Session Equipment Utilized During Treatment: Gait belt Activity Tolerance: Patient tolerated treatment well Patient left: in bed;with call bell/phone within reach;with bed alarm set;with family/visitor present;with SCD's reapplied (polar care reapplied to R knee)     Time: 1497-0263 PT Time Calculation (min) (ACUTE ONLY): 34 min  Charges:  $Therapeutic Exercise: 8-22 mins                    G Codes:      Milon Score 03/31/2015, 2:11 PM  Treatment session lead, documented by Denna Haggard, SPT  This patient note, response to treatment and overall treatment plan has been reviewed and this clinician agrees with the information provided.  Kristen H. Owens Shark, PT, DPT, NCS 03/31/2015, 4:22 PM [phone]

## 2015-03-31 NOTE — Progress Notes (Signed)
Pt. Refused all vitamins, insulin and blood pressure meds stating she wants to start everything fresh tomorrow morning.

## 2015-03-31 NOTE — Progress Notes (Signed)
   Subjective: 1 Day Post-Op Procedure(s) (LRB): COMPUTER ASSISTED TOTAL KNEE ARTHROPLASTY (Right) Patient reports pain as 9 on 0-10 scale.   Patient is well, and has had no acute complaints or problems We will start therapy today.  Plan is to go Rehab after hospital stay. no nausea and no vomiting Patient denies any chest pains or shortness of breath. Objective: Vital signs in last 24 hours: Temp:  [97 F (36.1 C)-98.2 F (36.8 C)] 98 F (36.7 C) (09/29 0716) Pulse Rate:  [53-83] 56 (09/29 0716) Resp:  [16-19] 16 (09/29 0716) BP: (132-187)/(38-73) 164/53 mmHg (09/29 0716) SpO2:  [95 %-100 %] 97 % (09/29 0716) FiO2 (%):  [21 %] 21 % (09/28 2025) Weight:  [87.091 kg (192 lb)-93.26 kg (205 lb 9.6 oz)] 93.26 kg (205 lb 9.6 oz) (09/28 2053) well approximated incision Heels are non tender and elevated off the bed using rolled towels Intake/Output from previous day: 09/28 0701 - 09/29 0700 In: 4357 [I.V.:2565; IV Piggyback:250] Out: 9379 [Urine:730; Drains:560; Blood:50] Intake/Output this shift:     Recent Labs  03/31/15 0618  HGB 11.2*    Recent Labs  03/31/15 0618  WBC 12.9*  RBC 3.61*  HCT 33.6*  PLT 166    Recent Labs  03/31/15 0618  NA 135  K 3.5  CL 102  CO2 24  BUN 9  CREATININE 0.65  GLUCOSE 313*  CALCIUM 7.7*   No results for input(s): LABPT, INR in the last 72 hours.  EXAM General - Patient is Alert, Appropriate and Oriented Extremity - Neurologically intact Neurovascular intact Sensation intact distally Intact pulses distally Dorsiflexion/Plantar flexion intact Dressing - dressing C/D/I Motor Function - intact, moving foot and toes well on exam. Able to do straight leg raise with minimal assistance  Past Medical History  Diagnosis Date  . Hyperlipidemia   . Hypertension   . Sprain of lumbar region   . Thoracic or lumbosacral neuritis or radiculitis, unspecified   . Allergic rhinitis, cause unspecified   . Diabetes mellitus     type  II  . 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 Dermatophytosis of nail   . Esophageal reflux   . Cellulitis and abscess of leg, except foot   . Urinary tract infection, site not specified   . Acute cystitis   . Conjunctivitis unspecified   . Bronchitis, not specified as acute or chronic   . 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 Renal artery stenosis   . Atherosclerosis of renal artery     left    Assessment/Plan: 1 Day Post-Op Procedure(s) (LRB): COMPUTER ASSISTED TOTAL KNEE ARTHROPLASTY (Right) Active Problems:   Right knee DJD   Total knee replacement status  Estimated body mass index is 35.27 kg/(m^2) as calculated from the following:   Height as of this encounter: 5\' 4"  (1.626 m).   Weight as of this encounter: 93.26 kg (205 lb 9.6 oz). Up with therapy D/C IV fluids Discharge to SNF on Saturday  Labs: Were reviewed DVT Prophylaxis - Lovenox, Foot Pumps and TED hose Weight-Bearing as tolerated to right leg Begin working on having a bowel movement D/C O2 and Pulse OX and try on Room [employer] R. Hatfield Cora 03/31/2015, 7:49 AM

## 2015-03-31 NOTE — Evaluation (Signed)
Occupational Therapy Evaluation Patient Details Name: Bonnie Rowland MRN: [id number] DOB: [birth year] Today's Date: 03/31/2015    History of Present Illness This paitent is a 74 year old female who came to [facility] for a R TKR.   Clinical Impression   This patient is a 74 year old female  who came to [facility] for a R total knee replacement.  Patient lives in a ranch home with a basement.  She had been independent with ADL and functional mobility and working half time at [employer]. She now requires assist and would benefit from Occupational Therapy for ADL/functioal mobility training.      Follow Up Recommendations       Equipment Recommendations       Recommendations for Other Services       Precautions / Restrictions Precautions Precautions: Fall Restrictions Weight Bearing Restrictions: No Other Position/Activity Restrictions: WBAT      Mobility   Balance     ADL                                         General ADL Comments: Patient had been independent and working half time. She now needs assist . Patient practiced lower body dressing techniques using hip kit with set up and minimal assist. Patient Donned/doffed socks and pants to knees (drain still in place) with verbal cues for technique. Instructed that she may not need hip kit in a few days.      Vision     Perception     Praxis      Pertinent Vitals/Pain Pain Assessment: 0-10 Pain Score: 8  (Nurse preparing to give medication) Pain Location: R knee Pain Intervention(s): Monitored during session;Premedicated before session;Repositioned;Ice applied     Hand Dominance     Extremity/Trunk Assessment Upper Extremity Assessment Upper Extremity Assessment: Overall WFL for tasks assessed   Lower Extremity Assessment Lower Extremity Assessment: Defer to PT evaluation RLE Deficits / Details: R knee/LE strength and ROM deficits        Communication  Communication Communication: No difficulties   Cognition Arousal/Alertness: Awake/alert Behavior During Therapy: WFL for tasks assessed/performed Overall Cognitive Status: Within Functional Limits for tasks assessed                     General Comments       Exercises       Shoulder Instructions      Home Living Family/patient expects to be discharged to:: Private residence Living Arrangements: Spouse/significant other Available Help at Discharge: Family Type of Home: House Home Access: Stairs to enter [employer] of Steps: 2 steps onto porch and 1 step into home.    Home Layout:  (ranch with a basement.) Alternate Level Stairs-Number of Steps: 14             Home Equipment: Walker - 2 wheels;Walker - 4 wheels;Cane - single point          Prior Functioning/Environment Level of Independence: Independent        Comments: works half time at [employer]    OT Diagnosis:     OT Problem List:     OT Treatment/Interventions:      OT Goals(Current goals can be found in the care plan section) Acute Rehab OT Goals Patient Stated Goal: Pt wants to go home  OT Frequency:     Barriers to D/C:  Co-evaluation              End of Session    Activity Tolerance:   Patient left:     Time:  -    Charges:    G-Codes:    Myrene Galas, MS/OTR/L  03/31/2015, 11:51 AM

## 2015-03-31 NOTE — Progress Notes (Signed)
Inpatient Diabetes Program Recommendations  AACE/ADA: New Consensus Statement on Inpatient Glycemic Control (2015)  Target Ranges:  Prepandial:   less than 140 mg/dL      Peak postprandial:   less than 180 mg/dL (1-2 hours)      Critically ill patients:  140 - 180 mg/dL   Review of Glycemic Control  Diabetes history: Type 2 Outpatient Diabetes medications: Lantus 28 units qhs, Victoza 1.2mg  qday, Metformin 1000mg  bid Current orders for Inpatient glycemic control: Lantus 28 units qhs, Victoza 1.2mg  qday, Novolog 0-15 units tid, Metformin 1000mg  bid  Inpatient Diabetes Program Recommendations: Patient refused Lantus insulin last night resulting in elevated blood sugars this am.  Patient is well controlled at home with current medications.  A1C 7.1%  Gentry Fitz, RN, [place], [place], CDE Diabetes Coordinator Inpatient Diabetes Program  [phone] (Team Pager) [phone] ([place]) 03/31/2015 9:24 AM

## 2015-03-31 NOTE — Clinical Social Work Note (Signed)
Clinical Social Work Assessment  Patient Details  Name: Bonnie Rowland MRN: [id number] Date of Birth: Aug 10, 1940  Date of referral:  03/31/15               Reason for consult:  Facility Placement                Permission sought to share information with:  Chartered certified accountant granted to share information::  Yes, Verbal Permission Granted  Name::      [place]::   [place]   Relationship::     Contact Information:     Housing/Transportation Living arrangements for the past 2 months:  [place] of Information:  Patient, Friend/Neighbor Patient Interpreter Needed:  None Criminal Activity/Legal Involvement Pertinent to Current Situation/Hospitalization:  No - Comment as needed Significant Relationships:  Spouse Lives with:  Spouse Do you feel safe going back to the place where you live?  Yes Need for family participation in patient care:  Yes (Comment)  Care giving concerns: Patient lives in [place] with her husband Arlie Solomons.    Social Worker assessment / plan: Holiday representative (CSW) received SNF consult. PT is recommending SNF. CSW met with patient and her friend York Cerise was at bedside. Patient was alert and oriented and sitting in the chair. CSW introduced self and explained role of CSW department. Patient reported that she lives in [place] with her husband. CSW explained that PT is recommending rehab. Patient is agreeable to SNF search and prefers [facility]   Kim admissions coordinator at [facility] did make a bed offer. Patient accepted bed offer from [facility]. Plan is for patient to D/C to [facility] on Saturday 04/02/15.   Employment status:  Disabled (Comment on whether or not currently receiving Disability), Retired Forensic scientist:  Medicare PT Recommendations:  [place] / Referral to community resources:  [place]  Patient/Family's Response to  care: Patient is agreeable to go to [place] for rehab.   Patient/Family's Understanding of and Emotional Response to Diagnosis, Current Treatment, and Prognosis: Patient was pleasant throughout assessment and thanked CSW for visit.   Emotional Assessment Appearance:  Appears stated age Attitude/Demeanor/Rapport:    Affect (typically observed):  Accepting, Adaptable, Pleasant Orientation:  Oriented to Self, Oriented to Place, Oriented to  Time, Oriented to Situation Alcohol / Substance use:  Not Applicable Psych involvement (Current and /or in the community):  No (Comment)  Discharge Needs  Concerns to be addressed:  Discharge Planning Concerns Readmission within the last 30 days:  No Current discharge risk:  None Barriers to Discharge:  Continued Medical Work up   Loralyn Freshwater, LCSW 03/31/2015, 2:55 PM

## 2015-03-31 NOTE — Clinical Social Work Placement (Signed)
   CLINICAL SOCIAL WORK PLACEMENT  NOTE  Date:  03/31/2015  Patient Details  Name: LORAIN FETTES MRN: [id number] Date of Birth: 1940-09-22  Clinical Social Work is seeking post-discharge placement for this patient at the [place] level of care (*CSW will initial, date and re-position this form in  chart as items are completed):  Yes   Patient/family provided with [place] Work Department's list of facilities offering this level of care within the geographic area requested by the patient (or if unable, by the patient's family).  Yes   Patient/family informed of their freedom to choose among providers that offer the needed level of care, that participate in Medicare, Medicaid or managed care program needed by the patient, have an available bed and are willing to accept the patient.  Yes   Patient/family informed of [facility]'s ownership interest in [facility] and [facility], as well as of the fact that they are under no obligation to receive care at these facilities.  PASRR submitted to EDS on 03/31/15     PASRR number received on 03/31/15     Existing PASRR number confirmed on       FL2 transmitted to all facilities in geographic area requested by pt/family on 03/31/15     FL2 transmitted to all facilities within larger geographic area on       Patient informed that his/her managed care company has contracts with or will negotiate with certain facilities, including the following:        Yes   Patient/family informed of bed offers received.  Patient chooses bed at  [facility] )     Physician recommends and patient chooses bed at      Patient to be transferred to   on  .  Patient to be transferred to facility by       Patient family notified on   of transfer.  Name of family member notified:        PHYSICIAN Please sign FL2     Additional Comment:    _______________________________________________ Loralyn Freshwater, LCSW 03/31/2015, 2:54 PM

## 2015-03-31 NOTE — Progress Notes (Signed)
Pt. Dangled at the bedside this morning with no complications. Requested foley stay in a bit longer this morning since she didn't get out of surgery til later in the evening yesterday. Will pass on to next shift to remove foley.

## 2015-03-31 NOTE — Progress Notes (Addendum)
Discard note. Entered in error

## 2015-03-31 NOTE — Progress Notes (Signed)
Pts. Second autovac put out 60cc. Converted over to hemovac.

## 2015-03-31 NOTE — Evaluation (Signed)
Physical Therapy Evaluation Patient Details Name: Bonnie Rowland MRN: [id number] DOB: Nov 06, 1940 Today's Date: 03/31/2015   History of Present Illness  Pt s/p R TKA on 03/30/15.   Clinical Impression  Upon evaluation, pt was anxious about starting physical therapy and how bad the pain would be in her knee. Pt encouraged that the more movement we can get in the knee at day one, the better her prognosis. Pt was receptive to education and was motivated throughout the evaluation. Pt was able to demonstrate bed mobility with min assistance (pt assisted with RLE coming off of bed). Pt was anxious about supporting her RLE independently when coming off of bed, pt was able to support the entire weight of RLE but pt assisted with moving leg into proper position in order to place leg on ground. Pt also used bed rails for assistance with supine <> sitting on EOB transfer. Pt able to perform sit <>stand transfer with min assist; pt tends to put less weight through RLE when coming to standing at this time. Pt reminded that she has no weight bearing precautions with her RLE and that she should attempt to evenly distribute her weight with transfers and when standing. Pt able to get from bed to chair, ambulating ~3 feet with min assist and RW. Pt's ROM measured this morning; Extension: -6 degrees, Flexion: 55 degrees. Pt able to complete 10 SLR without issue so knee immobilizer is not indicated. Pt will continue to benefit from skilled acute PT services in order to progress RLE strength, ROM, and functional mobility.     Follow Up Recommendations SNF    Equipment Recommendations       Recommendations for Other Services       Precautions / Restrictions Precautions Precautions: Fall Restrictions Weight Bearing Restrictions: No      Mobility  Bed Mobility Overal bed mobility: Modified Independent Bed Mobility: Supine to Sit     Supine to sit: Min assist     General bed mobility comments: Pt  anxious/worried about having her R leg hanging off of the bed without having it supported by PT when coming to sit on EOB. Pt did a good job with supine <>sitting on EOB transfer, utilized bed rails for assistance with mobility.  Transfers Overall transfer level: Needs assistance Equipment used: Rolling walker (2 wheeled) Transfers: Sit to/from Stand Sit to Stand: Min assist         General transfer comment: Pt reminded that she does not have weight bearing restrictions on her RLE; encouraged to put as much weight through the R leg as possible for normalization of sit<>stand transfer. Pt demonstrated good "nose over toes" form but stuck R leg out and did more work with the L.   Ambulation/Gait Ambulation/Gait assistance: Min assist Ambulation Distance (Feet): 3 Feet (from bed to chair.) Assistive device: Rolling walker (2 wheeled) Gait Pattern/deviations: Antalgic;Shuffle;Decreased stance time - right;Decreased step length - left;Decreased stride length;Decreased weight shift to right     General Gait Details: specific gait details difficult to assess due to short distance traveled. Will attempt to ambulate further during this afternoon's treatment session.   Stairs            Wheelchair Mobility    Modified Rankin (Stroke Patients Only)       Balance Overall balance assessment: Needs assistance Sitting-balance support: No upper extremity supported;Feet supported Sitting balance-Leahy Scale: Good     Standing balance support: Bilateral upper extremity supported Standing balance-Leahy Scale: Fair  Pertinent Vitals/Pain Pain Assessment: 0-10 Pain Score: 6  Pain Location: R knee Pain Intervention(s): Monitored during session;Premedicated before session;Repositioned;Ice applied    Home Living Family/patient expects to be discharged to:: Private residence Living Arrangements: Spouse/significant other Available Help at  Discharge: Family Type of Home: House Home Access: Stairs to enter   [employer] of Steps: 2 steps onto porch and 1 step into home.  Home Layout: Multi-level;Able to live on main level with bedroom/bathroom Home Equipment: Gilford Rile - 2 wheels;Walker - 4 wheels;Cane - single point      Prior Function Level of Independence: Independent         Comments: Pt did not use assistive device for ambulation but did use furniture in her house to support her while moving around     Hand Dominance        Extremity/Trunk Assessment   Upper Extremity Assessment: Overall WFL for tasks assessed           Lower Extremity Assessment: RLE deficits/detail RLE Deficits / Details: R knee/LE strength and ROM deficits        Communication   Communication: No difficulties  Cognition Arousal/Alertness: Awake/alert Behavior During Therapy: WFL for tasks assessed/performed Overall Cognitive Status: Within Functional Limits for tasks assessed                      General Comments      Exercises Total Joint Exercises Ankle Circles/Pumps: AROM;Both;20 reps;Supine Quad Sets: AROM;Right;10 reps;Supine Gluteal Sets: Strengthening;Both;10 reps Heel Slides: AROM;10 reps;Right;Supine Hip ABduction/ADduction: AROM;Strengthening;10 reps;Supine Straight Leg Raises: AROM;Right;10 reps;Supine Goniometric ROM: Extension: -6 degrees, Flexion: 55 degrees      Assessment/Plan    PT Assessment Patient needs continued PT services  PT Diagnosis Difficulty walking;Abnormality of gait;Acute pain   PT Problem List Decreased strength;Decreased range of motion;Decreased activity tolerance;Decreased balance;Decreased mobility;Pain  PT Treatment Interventions DME instruction;Gait training;Stair training;Functional mobility training;Therapeutic activities;Therapeutic exercise;Balance training;Manual techniques   PT Goals (Current goals can be found in the Care Plan section) Acute Rehab PT  Goals Patient Stated Goal: Pt wants to go home PT Goal Formulation: With patient Time For Goal Achievement: 04/14/15 Potential to Achieve Goals: Good    Frequency BID   Barriers to discharge        Co-evaluation               End of Session Equipment Utilized During Treatment: Gait belt Activity Tolerance: Patient tolerated treatment well Patient left: in chair;with chair alarm set;with call bell/phone within reach;with SCD's reapplied           Time: 6629-4765 PT Time Calculation (min) (ACUTE ONLY): 35 min   Charges:         PT G CodesMilon Score 03/31/2015, 10:41 AM

## 2015-03-31 NOTE — Care Management Note (Signed)
Case Management Note  Patient Details  Name: LUZELENA HEEG MRN: [id number] Date of Birth: 06-22-1941  Subjective/Objective:  CSW following for SNF placement. RNCM will assist as needed.                   Action/Plan:   Expected Discharge Date:  04/02/15               Expected Discharge Plan:  Skilled Nursing Facility  In-House Referral:  Clinical Social Work  Discharge planning Services  CM Consult  Post Acute Care Choice:    Choice offered to:     DME Arranged:    DME Agency:     HH Arranged:    [place] Agency:     Status of Service:  In process, will continue to follow  Medicare Important Message Given:    Date Medicare IM Given:    Medicare IM give by:    Date Additional Medicare IM Given:    Additional Medicare Important Message give by:     If discussed at [place] of Stay Meetings, dates discussed:    Additional Comments:  Jolly Mango, RN 03/31/2015, 3:36 PM

## 2015-03-31 NOTE — Progress Notes (Signed)
Pt. Admitted to unit from PACU with right knee surgery. VSS. Autovac has drained 350cc. IVF infusing at 100cc/hr. Foley patent draining clear yellow urine. Polar care on and running. Dressing clean dry and intact. Pt. Still numb from spinal. Heels elevated off of bed with towel rolls. IS given to pt. With teach back instructions.

## 2015-03-31 NOTE — Plan of Care (Signed)
Problem: Consults Goal: Diagnosis- Total Joint Replacement Outcome: Completed/Met Date Met:  03/31/15 Primary Total Knee     

## 2015-04-01 ENCOUNTER — Ambulatory Visit: Payer: Medicare Other | Admitting: Cardiovascular Disease

## 2015-04-01 LAB — CBC
HEMATOCRIT: 33.5 % — AB (ref 35.0–47.0)
HEMOGLOBIN: 11.2 g/dL — AB (ref 12.0–16.0)
MCH: 30.7 pg (ref 26.0–34.0)
MCHC: 33.6 g/dL (ref 32.0–36.0)
MCV: 91.4 fL (ref 80.0–100.0)
Platelets: 162 10*3/uL (ref 150–440)
RBC: 3.66 MIL/uL — AB (ref 3.80–5.20)
RDW: 13 % (ref 11.5–14.5)
WBC: 15.8 10*3/uL — AB (ref 3.6–11.0)

## 2015-04-01 LAB — GLUCOSE, CAPILLARY
GLUCOSE-CAPILLARY: 196 mg/dL — AB (ref 65–99)
GLUCOSE-CAPILLARY: 173 mg/dL — AB (ref 65–99)
Glucose-Capillary: 267 mg/dL — ABNORMAL HIGH (ref 65–99)
Glucose-Capillary: 253 mg/dL — ABNORMAL HIGH (ref 65–99)

## 2015-04-01 MED ORDER — HYDROCODONE-ACETAMINOPHEN 5-325 MG PO TABS: 1.0000 | ORAL_TABLET | ORAL | Status: DC | PRN

## 2015-04-01 MED ORDER — OXYCODONE HCL 5 MG PO TABS: 5.0000 mg | ORAL_TABLET | ORAL | Status: DC | PRN

## 2015-04-01 MED ORDER — CELECOXIB 200 MG PO CAPS: 200.0000 mg | ORAL_CAPSULE | Freq: Two times a day (BID) | ORAL | Status: DC

## 2015-04-01 MED ORDER — ENOXAPARIN SODIUM 40 MG/0.4ML ~~LOC~~ SOLN: 40.0000 mg | Freq: Two times a day (BID) | SUBCUTANEOUS | Status: DC

## 2015-04-01 MED ORDER — CELECOXIB 200 MG PO CAPS
200.0000 mg | ORAL_CAPSULE | Freq: Two times a day (BID) | ORAL | Status: DC
  Administered 2015-04-01 – 2015-04-02 (×2): 200 mg via ORAL
  Filled 2015-04-01 (×2): qty 1

## 2015-04-01 MED ORDER — HYDROCODONE-ACETAMINOPHEN 5-325 MG PO TABS
1.0000 | ORAL_TABLET | ORAL | Status: DC | PRN
  Administered 2015-04-02: 1 via ORAL
  Administered 2015-04-02 (×2): 2 via ORAL
  Filled 2015-04-01 (×2): qty 2

## 2015-04-01 NOTE — Discharge Instructions (Signed)

## 2015-04-01 NOTE — Progress Notes (Signed)
  Subjective: 2 Days Post-Op Procedure(s) (LRB): COMPUTER ASSISTED TOTAL KNEE ARTHROPLASTY (Right) Patient reports pain as moderate.   Patient is well, and has had no acute complaints or problems Plan is to go Skilled nursing facility after hospital stay. Negative for chest pain and shortness of breath Fever: no Gastrointestinal:Negative for nausea and vomiting  Objective: Vital signs in last 24 hours: Temp:  [98 F (36.7 C)-98.8 F (37.1 C)] 98.5 F (36.9 C) (09/30 0728) Pulse Rate:  [56-71] 71 (09/30 0728) Resp:  [16-18] 18 (09/30 0728) BP: (140-176)/(55-60) 171/57 mmHg (09/30 0728) SpO2:  [95 %-96 %] 96 % (09/30 0728)  Intake/Output from previous day:  Intake/Output Summary (Last 24 hours) at 04/01/15 0756 Last data filed at 04/01/15 0700  Gross per 24 hour  Intake   1690 ml  Output   2668 ml  Net   -978 ml    Intake/Output this shift:    Labs:  Recent Labs  03/31/15 0618 04/01/15 0517  HGB 11.2* 11.2*    Recent Labs  03/31/15 0618 04/01/15 0517  WBC 12.9* 15.8*  RBC 3.61* 3.66*  HCT 33.6* 33.5*  PLT 166 162    Recent Labs  03/31/15 0618  NA 135  K 3.5  CL 102  CO2 24  BUN 9  CREATININE 0.65  GLUCOSE 313*  CALCIUM 7.7*   No results for input(s): LABPT, INR in the last 72 hours.   EXAM General - Patient is Alert, Appropriate and Oriented Extremity - Neurologically intact ABD soft Sensation intact distally Intact pulses distally Dorsiflexion/Plantar flexion intact Incision: scant drainage No cellulitis present Dressing/Incision - blood tinged drainage Motor Function - intact, moving foot and toes well on exam.   Hemovac removed today.  4x4 with tegaderm applied to drainage site. Bulky dressing removed, honeycomb dressing intact.  Past Medical History  Diagnosis Date  . Hyperlipidemia   . Hypertension   . Sprain of lumbar region   . Thoracic or lumbosacral neuritis or radiculitis, unspecified   . Allergic rhinitis, cause  unspecified   . Diabetes mellitus     type II  . 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 Dermatophytosis of nail   . Esophageal reflux   . Cellulitis and abscess of leg, except foot   . Urinary tract infection, site not specified   . Acute cystitis   . Conjunctivitis unspecified   . Bronchitis, not specified as acute or chronic   . 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 Renal artery stenosis   . Atherosclerosis of renal artery     left    Assessment/Plan: 2 Days Post-Op Procedure(s) (LRB): COMPUTER ASSISTED TOTAL KNEE ARTHROPLASTY (Right) Active Problems:   Right knee DJD   Total knee replacement status  Estimated body mass index is 35.27 kg/(m^2) as calculated from the following:   Height as of this encounter: 5\' 4"  (1.626 m).   Weight as of this encounter: 93.26 kg (205 lb 9.6 oz). Advance diet Up with therapy   Pt is doing well with no acute complaints. Pt needs to have a BM before discharge.  Plan on discharge tomorrow. Pt ambulated 25 feet yesterday with a walker.  PT is recommending SNF. SpO2 96% on room air today.  DVT Prophylaxis - Lovenox, Foot Pumps and TED hose Weight-Bearing as tolerated to right leg  J. Cameron Proud, [name] [facility] Orthopaedic Surgery 04/01/2015, 7:56 AM

## 2015-04-01 NOTE — Discharge Summary (Signed)
Physician Discharge Summary  Patient ID: Bonnie Rowland MRN: [id number] DOB/AGE: March 06, 1941 73 y.o.  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 Atrial flutter 02/22/2015  . Preoperative cardiovascular examination 02/22/2015  . Chronic pain 01/10/2015  . Diabetes mellitus type 2, uncontrolled 01/10/2015  . Fatigue 01/10/2015  . Malignant melanoma of back 01/10/2015  . Adiposity 01/10/2015  . 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 Generalized OA 01/10/2015  . Acute maxillary sinusitis 12/27/2014  . Spinal stenosis, lumbar region, 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  . DDD (degenerative disc disease), lumbar 11/14/2014  . Neuropathy due to secondary diabetes 11/14/2014  . Greater trochanteric bursitis of both hips 11/14/2014  . Piriformis syndrome 11/14/2014  . 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 Osteoarthritis of knee 05/21/2014  . Arthritis of knee, degenerative 11/06/2013  . Chest pain 11/06/2010  . Diabetes type 2, uncontrolled 11/07/2009  . Hyperlipemia 11/07/2009  . HYPERTENSION, BENIGN 11/07/2009  . RENAL ATHEROSCLEROSIS 11/07/2009  . Allergic rhinitis 11/27/2007  . Lumbosacral neuritis 01/17/2007    Past Medical History  Diagnosis Date  . Hyperlipidemia   . Hypertension   . Sprain of lumbar region   . Thoracic or lumbosacral neuritis or radiculitis, unspecified   . Allergic rhinitis, cause unspecified   . Diabetes mellitus     type II  . 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 Dermatophytosis of nail   . Esophageal reflux   . Cellulitis and abscess of leg, except foot   . Urinary tract infection, site not specified   . Acute cystitis   . Conjunctivitis unspecified   . Bronchitis, not specified as acute  or chronic   . 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 Renal artery stenosis   . Atherosclerosis of renal artery     left     Transfusion: None   Consultants (if any):  None  Discharged Condition: Improved  Hospital Course: Bonnie Rowland is an 74 y.o. female who was admitted 03/30/2015 with a diagnosis of Degenerative arthrosis of the right knee, primary  and went to the operating room on 03/30/2015 and underwent the above named procedures.    Surgeries: Procedure(s): COMPUTER ASSISTED TOTAL KNEE ARTHROPLASTY on 03/30/2015 Patient tolerated the surgery well. Taken to PACU where she was stabilized and then transferred to the orthopedic floor.  Started on Lovenox 30mg  q 12 hrs. Foot pumps applied bilaterally at 80 mm. Heels elevated on bed with rolled towels. No evidence of DVT. Negative Homan. Physical therapy started on day #1 for gait training and transfer. OT started day #1 for ADL and assisted devices.  Patient's IV , foley was d/c on POD1 Hemovac d/c on POD2   Implants: DePuy PFC Sigma size 2.5 posterior stabilized femoral component (cemented), size 2.5 MBT tibial component (cemented), 35 mm 3 peg oval dome patella (cemented), and a 10 mm stabilized rotating platform polyethylene insert.  She was given perioperative antibiotics:  Anti-infectives    Start     Dose/Rate Route Frequency Ordered Stop   03/31/15 0000  ceFAZolin (ANCEF) IVPB 2 g/50 mL premix     2 g 100 mL/hr over 30 Minutes Intravenous Every 6 hours 03/30/15 2024 03/31/15 1828  03/30/15 1142  ceFAZolin (ANCEF) 2-3 GM-% IVPB SOLR    Comments:  KENNEDY, ASHLEY: cabinet override      03/30/15 1142 03/30/15 2344   03/30/15 0200  ceFAZolin (ANCEF) IVPB 2 g/50 mL premix     2 g 100 mL/hr over 30 Minutes Intravenous  Once 03/30/15 0151 03/30/15 1608    .  She was given sequential compression devices, early ambulation, and Lovenox for DVT  prophylaxis.  She benefited maximally from the hospital stay and there were no complications.    Recent vital signs:  Filed Vitals:   04/01/15 0728  BP: 171/57  Pulse: 71  Temp: 98.5 F (36.9 C)  Resp: 18    Recent laboratory studies:  Lab Results  Component Value Date   HGB 11.2* 04/01/2015   HGB 11.2* 03/31/2015   HGB 13.1 03/16/2015   Lab Results  Component Value Date   WBC 15.8* 04/01/2015   PLT 162 04/01/2015   Lab Results  Component Value Date   INR 1.14 03/16/2015   Lab Results  Component Value Date   NA 135 03/31/2015   K 3.5 03/31/2015   CL 102 03/31/2015   CO2 24 03/31/2015   BUN 9 03/31/2015   CREATININE 0.65 03/31/2015   GLUCOSE 313* 03/31/2015    Discharge Medications:     Medication List    TAKE these medications        ALFALFA PO  Take 3 tablets by mouth every morning.     amoxicillin-clavulanate 875-125 MG tablet  Commonly known as:  AUGMENTIN  Take 1 tablet by mouth. As needed for dental work or sinus infection.     aspirin 81 MG EC tablet  Take 81 mg by mouth at bedtime.     B-complex with vitamin C tablet  Take 2 tablets by mouth at bedtime.     cholecalciferol 1000 UNITS tablet  Commonly known as:  VITAMIN D  Take 4,000 Units by mouth daily.     clobetasol ointment 0.05 %  Commonly known as:  TEMOVATE  Apply 1 application topically 2 (two) times daily as needed (bug bites).     CoQ10 200 MG Caps  Take 1 tablet by mouth daily. 400 mg every am.     cyclobenzaprine 5 MG tablet  Commonly known as:  FLEXERIL  Take 1 tablet (5 mg total) by mouth 2 (two) times daily.     enoxaparin 40 MG/0.4ML injection  Commonly known as:  LOVENOX  Inject 0.4 mLs (40 mg total) into the skin every 12 (twelve) hours.     ESTRACE VAGINAL 0.1 MG/GM vaginal cream  Generic drug:  estradiol  1 application 2 (two) times a week.     fish oil-omega-3 fatty acids 1000 MG capsule  Take 2 g by mouth daily.     flecainide 50 MG tablet  Commonly  known as:  TAMBOCOR  Take 1 tablet (50 mg total) by mouth 2 (two) times daily.     fluconazole 150 MG tablet  Commonly known as:  DIFLUCAN  Take 150 mg by mouth as needed.     GARLIC 1610 PO  Take 1 tablet by mouth daily. 1 gram     Glucosamine 500 MG Tabs  Take 1 tablet by mouth daily.     glucose blood test strip  Commonly known as:  ONE TOUCH ULTRA TEST  Use as instructed     Hydrocodone-Acetaminophen 10-300 MG Tabs  Take 1 tablet by mouth every 8 (eight) hours as  needed.     HYDROcodone-acetaminophen 10-325 MG tablet  Commonly known as:  NORCO  Take 1 tablet by mouth every 8 (eight) hours as needed.     HYDROcodone-acetaminophen 10-325 MG tablet  Commonly known as:  NORCO  Take 1 tablet by mouth every 8 (eight) hours as needed.     Insulin Glargine 100 UNIT/ML Solostar Pen  Commonly known as:  LANTUS SOLOSTAR  Inject 28 Units into the skin at bedtime.     Insulin Pen Needle 31G X 5 MM Misc  1 pen by Does not apply route 2 (two) times daily.     LECITHIN PO  Take 1 capsule by mouth every other day.     Liraglutide 18 MG/3ML Sopn  Commonly known as:  VICTOZA  Inject 0.2 mLs (1.2 mg total) into the skin once.     losartan-hydrochlorothiazide 100-25 MG tablet  Commonly known as:  HYZAAR  Take 1 tablet by mouth daily.     MAGNESIUM-POTASSIUM PO  Take 1 tablet by mouth 2 (two) times daily. Magnesium 300 mg potassium 40 mg in each tablet.     metaxalone 800 MG tablet  Commonly known as:  SKELAXIN  Take 1 tablet (800 mg total) by mouth 3 (three) times daily.     metformin 500 MG (OSM) 24 hr tablet  Commonly known as:  FORTAMET  Take 2 tablets (1,000 mg total) by mouth 2 (two) times daily with a meal.     metoprolol succinate 25 MG 24 hr tablet  Commonly known as:  TOPROL XL  Take 1 tablet (25 mg total) by mouth daily.     multivitamin tablet  Take 1 tablet by mouth daily.     NON FORMULARY  Take 1 tablet by mouth 2 (two) times daily. neutroferon vitamin  daily.     omeprazole 20 MG capsule  Commonly known as:  PRILOSEC  Take 20 mg capsule every other day     onetouch ultrasoft lancets  Use as instructed     oxyCODONE 5 MG immediate release tablet  Commonly known as:  Oxy IR/ROXICODONE  Take 1-2 tablets (5-10 mg total) by mouth every 4 (four) hours as needed for severe pain or breakthrough pain.     PRESERVISION AREDS 2 Caps  Take 1 capsule by mouth 2 (two) times daily.     PROBIOTIC PO  Take 1 Dose by mouth at bedtime.     raloxifene 60 MG tablet  Commonly known as:  EVISTA  Take 1 tablet (60 mg total) by mouth daily.     rosuvastatin 10 MG tablet  Commonly known as:  CRESTOR  Take 1 tablet (10 mg total) by mouth daily.     sulfacetamide 10 % ophthalmic solution  Commonly known as:  BLEPH-10  Place 1 drop into both eyes 3 (three) times daily as needed.     SYSTANE BALANCE OP  Apply 1 drop to eye as needed.     vitamin C 500 MG tablet  Commonly known as:  ASCORBIC ACID  Take 500 mg by mouth 2 (two) times daily.     vitamin E 400 UNIT capsule  Generic drug:  vitamin E  Take 400 Units by mouth daily. Every am        Diagnostic Studies: Dg Knee Complete 4 Views Right  03/30/2015   CLINICAL DATA:  Postop knee film  EXAM: RIGHT KNEE - COMPLETE 4+ VIEW  COMPARISON:  None.  FINDINGS: Total knee arthroplasty is well seated. A  subtle lucency through the medial supracondylar femur does not continue through cortex; no periprosthetic fracture suspected. Normal alignment.  IMPRESSION: Recent total knee arthroplasty.   Electronically Signed   By: Monte Fantasia M.D.   On: 03/30/2015 21:40    Disposition: Pt has a bed at [facility] facility.  She benefited greatly from her hospital stay.  Pt's foley and IV were removed on POD1.  Hemovac removed on POD2.  Plan on d/c to SNF on 04/02/15 pending successful PT and pt will need to have a bowel movement prior to discharge.        Follow-up Information    Follow up with [facility] R[facility]  PA On 04/14/2015.   Specialty:  Physician Assistant   Why:  at 8:45am   Contact information:   [place] [address] [phone]       Follow up with Dereck Leep, MD On 05/17/2015.   Specialty:  Orthopedic Surgery   Why:  at 11:15am   Contact information:   [place] [address] [phone]        Signed: Judson Roch [name] 04/01/2015, 12:23 PM

## 2015-04-01 NOTE — Care Management Important Message (Signed)
Important Message  Patient Details  Name: Bonnie Rowland MRN: [id number] Date of Birth: 1941-05-15   Medicare Important Message Given:  Yes-second notification given    [name] 04/01/2015, 1:19 PM

## 2015-04-01 NOTE — Progress Notes (Addendum)
POD 2. Pt. Alert and oriented. VSS. Pain controlled with pain meds per MAR. Zofran administered for nausea. Pt. Has urinated multiple times throughout the night. Very restless night. Heels elevated off of bed with towel rolls. IS at the bedside and pt. Encouraged to use it. Polarcare on and running. MOM administered. Neurochecks WDL. Pt. Plans to go to [facility] on Saturday. Will continue to monitor.

## 2015-04-01 NOTE — Progress Notes (Addendum)
Physical Therapy Treatment Patient Details Name: Bonnie Rowland MRN: [id number] DOB: 04-13-1941 Today's Date: 04/01/2015    History of Present Illness This paitent is a 74 year old female who came to [facility] for a R TKR.    PT Comments    Pt reports feeling more sore and stiff today than she did post-op day 1. Despite these complaints, pt was able to display increased R knee ROM with measurements of 66 degrees of flexion and -4 degrees of extension. Pt was able to demonstrate increased independence with bed mobility; requiring only assistance from bed rail to achieve supine <>sitting on EOB. Pt ambulated ~100 ft with min assist and RW. Pt initially was very tentative with gait and was taking small steps with a flat foot/midfoot contact on R. Pt instructed to perform proper heel contact with RLE and remain on RLE longer with stance in order to take a longer step with LLE. After instruction pt was able to demonstrate step through gait pattern and increased gait speed. Pt requires further skilled acute PT services in order to increase strength,ROM, and overall functional mobility.   Follow Up Recommendations  SNF     Equipment Recommendations       Recommendations for Other Services       Precautions / Restrictions Precautions Precautions: Fall Restrictions Weight Bearing Restrictions: Yes RLE Weight Bearing: Weight bearing as tolerated    Mobility  Bed Mobility Overal bed mobility: Modified Independent (pt able to transfer supine to EOB without assistance from PT but did use bedrail) Bed Mobility: Supine to Sit     Supine to sit: Modified independent (Device/Increase time) (increased time and bed rail for assistance)     General bed mobility comments: Pt able to control weight of her own leg when coming to sitting on EOB. Less anxious about pain today.   Transfers Overall transfer level: Needs assistance Equipment used: Rolling walker (2 wheeled)   Sit to Stand: Min  assist         General transfer comment: Min assist with standing; still requires encouragement to bear weight equally through R leg and keep it flexed and not extended out in front of her.   Ambulation/Gait Ambulation/Gait assistance: Min assist Ambulation Distance (Feet): 100 Feet Assistive device: Rolling walker (2 wheeled)     Gait velocity interpretation: <1.8 ft/sec, indicative of risk for recurrent falls General Gait Details: Due to knee stiffness pt was hesistant and slow with first few feet of gait. Pt encouraged to make proper heel contact with her RLE and remain in stance longer on R leg in order to take longer step with the L. Pt was able to show good ability to follow direction as her gait patternn and speed improved after these recommendations.    Stairs            Wheelchair Mobility    Modified Rankin (Stroke Patients Only)       Balance Overall balance assessment: Needs assistance Sitting-balance support: No upper extremity supported;Feet supported Sitting balance-Leahy Scale: Good     Standing balance support: Bilateral upper extremity supported Standing balance-Leahy Scale: Fair                      Cognition Arousal/Alertness: Awake/alert Behavior During Therapy: WFL for tasks assessed/performed Overall Cognitive Status: Within Functional Limits for tasks assessed                      Exercises Total Joint  Exercises Ankle Circles/Pumps: AROM;Both;15 reps;Supine Quad Sets: Strengthening;Both;10 reps;Supine Gluteal Sets: AROM;Both;10 reps;Supine Heel Slides: AROM;Right;10 reps;Seated Hip ABduction/ADduction: AROM;Right;10 reps;Supine Goniometric ROM: Extension: -4 degrees, Flexion: 66 degrees    General Comments        Pertinent Vitals/Pain Pain Assessment: 0-10 Pain Score: 7  Pain Location: R knee Pain Intervention(s): Monitored during session;Premedicated before session;Repositioned;Ice applied    Home Living                       Prior Function            PT Goals (current goals can now be found in the care plan section) Acute Rehab PT Goals Patient Stated Goal: Pt wants to go home PT Goal Formulation: With patient Time For Goal Achievement: 04/14/15 Potential to Achieve Goals: Good Progress towards PT goals: Progressing toward goals    Frequency  BID    PT Plan Current plan remains appropriate    Co-evaluation             End of Session Equipment Utilized During Treatment: Gait belt Activity Tolerance: Patient tolerated treatment well Patient left: in chair;with call bell/phone within reach;with SCD's reapplied (with polarcare reapplied; knee in extension with towel roll under heel)     Time: 4097-3532 PT Time Calculation (min) (ACUTE ONLY): 38 min  Charges:                       G Codes:      Milon Score 04/01/2015, 10:54 AM   Treatment session lead, documented by Denna Haggard, SPT.  This patient note, response to treatment and overall treatment plan has been reviewed and this clinician agrees with the information provided.  Kristen H. Owens Shark, PT, DPT, NCS 04/01/2015, 3:48 PM [phone]

## 2015-04-01 NOTE — Progress Notes (Signed)
Plan is for patient to D/C to [facility] Saturday 04/02/15. Per Kim admissions coordinator at [facility] patient is going to room 215-A. RN will call report at [phone]. D/C Summary was sent to [facility] via carefinder today. Patient signed [facility] consent form. Patient and husband are aware of above. Clinical Social Worker (CSW) will continue to follow and assist as needed.   Blima Rich, [place] [phone]

## 2015-04-01 NOTE — Progress Notes (Signed)
Patient complains of high level of pain entire shift but during sedation evaluation patient appears to be sedated and falls asleep during communication.  Educated patient on importance of managing her level of pain with her level of sedation and that she needs to be able to participate with PT alert and focused.

## 2015-04-01 NOTE — Progress Notes (Signed)
Physical Therapy Treatment Patient Details Name: Bonnie Rowland MRN: [id number] DOB: 12-Sep-1940 Today's Date: 04/01/2015    History of Present Illness This paitent is a 74 year old female who came to [facility] for a R TKR.    PT Comments    Upon arrival, pt was asleep in her recliner and difficult to arouse. Pt appeared groggy, distant, and less responsive to conversation/verbal commands compared to her mood at previous PT sessions. Pt states that her pain in her R knee is 9/10, she does feel as if the pain meds are making her groggy and are not helping much with the pain. Pt states that she is feeling more stiff this afternoon; which was apparent during the performance of therapeutic exercises and ambulation. Pt ambulated ~35 feet this afternoon but did not display the same progress in gait pattern as she did this morning. Pt tended to land with a midfoot strike (despite vc for appropriate heel strike) on the R and would routinely take small steps with the LLE  due her antalgic gait favoring the RLE. Pt will continue to benefit from skilled acute PT services in order to increase her strength, ROM, and overall capacity for functional mobility prior to d/c.   Follow Up Recommendations  SNF     Equipment Recommendations       Recommendations for Other Services       Precautions / Restrictions Precautions Precautions: Fall Restrictions Weight Bearing Restrictions: Yes RLE Weight Bearing: Weight bearing as tolerated    Mobility  Bed Mobility Overal bed mobility: Needs Assistance Bed Mobility: Sit to Supine     Supine to sit: Modified independent (Device/Increase time) (increased time and bed rail for assistance) Sit to supine: Min assist   General bed mobility comments: Pt required PT assist to get legs onto bed in appropriate position. Pt appeared to have difficulty with following simple bed mobility commands.  Transfers Overall transfer level: Needs assistance Equipment used:  Rolling walker (2 wheeled) Transfers: Sit to/from Stand Sit to Stand: Min assist         General transfer comment: Pt encouraged to increase speed of sit to stand transfer in order to decrease difficulty of movement. Pt was able to demonstrate a better sit to stand after this recommendation. When performing stand to sit transfer pt did not control her body weight well with her BUEs. This is the first instance of pt doing this. Seemed harder for pt to focus on task.   Ambulation/Gait Ambulation/Gait assistance: Min assist Ambulation Distance (Feet): 35 Feet Assistive device: Rolling walker (2 wheeled)     Gait velocity interpretation: <1.8 ft/sec, indicative of risk for recurrent falls General Gait Details: Pt appeared more stiff with gait this afternoon; pt unable to demonstrate adequate heel strike and took a much short step with her L leg. Pt unresponsive to suggestions unlike this morning. Much more antalgic gait favoring the R leg with decreased stance time on R.    Stairs            Wheelchair Mobility    Modified Rankin (Stroke Patients Only)       Balance Overall balance assessment: Needs assistance Sitting-balance support: No upper extremity supported;Feet supported Sitting balance-Leahy Scale: Good     Standing balance support: Bilateral upper extremity supported Standing balance-Leahy Scale: Fair Standing balance comment:  (Pt states that she felt like her balance was "off" this afternoon)  Cognition Arousal/Alertness: Lethargic;Suspect due to medications Behavior During Therapy: Flat affect Overall Cognitive Status: Impaired/Different from baseline (pt appeared to be groggy and distant, not as responsive to commands as she had been in previous sessions. Pt  would occasionally nod off during session or become unresponsive to conversation. ) Area of Impairment: Attention;Following commands                    Exercises Total  Joint Exercises Ankle Circles/Pumps: AROM;15 reps;Seated;Both Quad Sets: Strengthening;Both;10 reps;Supine Gluteal Sets: Strengthening;Both;10 reps;Seated Heel Slides: AROM;Right;10 reps;Seated (w/ PT assisted OP) Hip ABduction/ADduction: AROM;Right;10 reps;Supine Goniometric ROM: Extension: -4 degrees, Flexion: 66 degrees    General Comments        Pertinent Vitals/Pain Pain Assessment: 0-10 Pain Score: 9  Pain Location: R knee Pain Descriptors / Indicators: Sore Pain Intervention(s): Monitored during session;Premedicated before session;Repositioned;Ice applied (pt reports that pain meds are making her groggy but not doing a good job of covering up the pain )    Home Living                      Prior Function            PT Goals (current goals can now be found in the care plan section) Acute Rehab PT Goals Patient Stated Goal: Pt wants to go home PT Goal Formulation: With patient Time For Goal Achievement: 04/14/15 Potential to Achieve Goals: Good Progress towards PT goals: Progressing toward goals    Frequency  BID    PT Plan Current plan remains appropriate    Co-evaluation             End of Session Equipment Utilized During Treatment: Gait belt Activity Tolerance: Patient tolerated treatment well Patient left: in bed;with bed alarm set;with call bell/phone within reach;with SCD's reapplied  Pt's nurse made aware of her status this session and that it's possibly due to her medication     Time: 1325-1350 PT Time Calculation (min) (ACUTE ONLY): 25 min  Charges:  $Gait Training: 8-22 mins $Therapeutic Exercise: 8-22 mins $Therapeutic Activity: 8-22 mins                    G Codes:      Milon Score 04/01/2015, 2:20 PM

## 2015-04-02 ENCOUNTER — Encounter
Admission: RE | Admit: 2015-04-02 | Discharge: 2015-04-02 | Disposition: A | Discharge status: Home / Self Care | Payer: Medicare Other | Source: Ambulatory Visit | Attending: Internal Medicine | Admitting: Internal Medicine

## 2015-04-02 DIAGNOSIS — I10 Essential (primary) hypertension: Secondary | ICD-10-CM | POA: Diagnosis not present

## 2015-04-02 DIAGNOSIS — Z794 Long term (current) use of insulin: Secondary | ICD-10-CM | POA: Diagnosis not present

## 2015-04-02 DIAGNOSIS — K5900 Constipation, unspecified: Secondary | ICD-10-CM | POA: Diagnosis not present

## 2015-04-02 DIAGNOSIS — M159 Polyosteoarthritis, unspecified: Secondary | ICD-10-CM | POA: Diagnosis not present

## 2015-04-02 DIAGNOSIS — E119 Type 2 diabetes mellitus without complications: Secondary | ICD-10-CM | POA: Diagnosis not present

## 2015-04-02 DIAGNOSIS — E1140 Type 2 diabetes mellitus with diabetic neuropathy, unspecified: Secondary | ICD-10-CM | POA: Diagnosis not present

## 2015-04-02 DIAGNOSIS — R2689 Other abnormalities of gait and mobility: Secondary | ICD-10-CM | POA: Diagnosis not present

## 2015-04-02 DIAGNOSIS — Z471 Aftercare following joint replacement surgery: Secondary | ICD-10-CM | POA: Diagnosis not present

## 2015-04-02 DIAGNOSIS — K219 Gastro-esophageal reflux disease without esophagitis: Secondary | ICD-10-CM | POA: Diagnosis not present

## 2015-04-02 DIAGNOSIS — Z79810 Long term (current) use of selective estrogen receptor modulators (SERMs): Secondary | ICD-10-CM | POA: Diagnosis not present

## 2015-04-02 DIAGNOSIS — Z7984 Long term (current) use of oral hypoglycemic drugs: Secondary | ICD-10-CM | POA: Diagnosis not present

## 2015-04-02 DIAGNOSIS — Z96651 Presence of right artificial knee joint: Secondary | ICD-10-CM | POA: Diagnosis not present

## 2015-04-02 DIAGNOSIS — M6281 Muscle weakness (generalized): Secondary | ICD-10-CM | POA: Diagnosis not present

## 2015-04-02 DIAGNOSIS — M4800 Spinal stenosis, site unspecified: Secondary | ICD-10-CM | POA: Diagnosis not present

## 2015-04-02 DIAGNOSIS — Z7982 Long term (current) use of aspirin: Secondary | ICD-10-CM | POA: Diagnosis not present

## 2015-04-02 DIAGNOSIS — E785 Hyperlipidemia, unspecified: Secondary | ICD-10-CM | POA: Diagnosis not present

## 2015-04-02 DIAGNOSIS — I4892 Unspecified atrial flutter: Secondary | ICD-10-CM | POA: Diagnosis not present

## 2015-04-02 DIAGNOSIS — E113293 Type 2 diabetes mellitus with mild nonproliferative diabetic retinopathy without macular edema, bilateral: Secondary | ICD-10-CM | POA: Diagnosis not present

## 2015-04-02 DIAGNOSIS — M810 Age-related osteoporosis without current pathological fracture: Secondary | ICD-10-CM | POA: Diagnosis not present

## 2015-04-02 LAB — GLUCOSE, CAPILLARY
GLUCOSE-CAPILLARY: 215 mg/dL — AB (ref 65–99)
GLUCOSE-CAPILLARY: 183 mg/dL — AB (ref 65–99)
Glucose-Capillary: 275 mg/dL — ABNORMAL HIGH (ref 65–99)

## 2015-04-02 NOTE — Progress Notes (Signed)
Physical Therapy Treatment Patient Details Name: Bonnie Rowland MRN: [id number] DOB: September 01, 1940 Today's Date: 04/02/2015    History of Present Illness This paitent is a 74 year old female who came to [facility] for a R TKR.    PT Comments    Pt tolerating treatment session well, motivated and able to complete entire PT sesssion as planned. Pt continues to make progress toward goals as evidenced by improved pain control and ambulation distance. Pt's greatest limitation continues to be focal RLE weakness which continues to limit ability to perform safe gait mechanics and prolonged ambulation distances at baseline function. Patient presenting with impairment of strength, pain, range of motion, and activity tolerance, limiting ability to perform ADL and mobility tasks at  baseline level of function. Patient will benefit from skilled intervention to address the above impairments and limitations, in order to restore to prior level of function, improve patient safety upon discharge, and to decrease caregiver burden.    Follow Up Recommendations  SNF     Equipment Recommendations  None recommended by PT    Recommendations for Other Services       Precautions / Restrictions Precautions Precautions: Fall Restrictions Weight Bearing Restrictions: Yes RLE Weight Bearing: Weight bearing as tolerated    Mobility  Bed Mobility Overal bed mobility: Needs Assistance Bed Mobility: Supine to Sit     Supine to sit: Modified independent (Device/Increase time)        Transfers Overall transfer level: Needs assistance Equipment used: Rolling walker (2 wheeled) Transfers: Sit to/from Stand Sit to Stand: Min guard         General transfer comment: Able to perform with heavy use of BUE.   Ambulation/Gait Ambulation/Gait assistance: Min guard Ambulation Distance (Feet): 100 Feet (20 to BR; 100 thereafter. ) Assistive device: Rolling walker (2 wheeled) Gait Pattern/deviations:  Shuffle;Decreased step length - right;Decreased step length - left;Wide base of support   Gait velocity interpretation: <1.8 ft/sec, indicative of risk for recurrent falls General Gait Details: Encouraged to attempt heel strike bilat and improve step length. Pt shuffling, showing signs of fatigue, gait parameters becoming precarious.    Stairs            Wheelchair Mobility    Modified Rankin (Stroke Patients Only)       Balance Overall balance assessment: Modified Independent Sitting-balance support: No upper extremity supported Sitting balance-Leahy Scale: Good     Standing balance support: Bilateral upper extremity supported Standing balance-Leahy Scale: Fair                      Cognition Arousal/Alertness: Lethargic;Suspect due to medications Behavior During Therapy: [facility] for tasks assessed/performed Overall Cognitive Status: Within Functional Limits for tasks assessed         Following Commands: Follows multi-step commands consistently            Exercises Total Joint Exercises Quad Sets: Strengthening;Both;Supine;AROM;Right;15 reps Heel Slides: AROM;Right;Strengthening;15 reps;Supine Goniometric ROM: R knee Flexion: 3-71 degrees.     General Comments        Pertinent Vitals/Pain Pain Assessment: 0-10 Pain Score: 5  Pain Location: R knee Pain Intervention(s): Limited activity within patient's tolerance;Monitored during session;Premedicated before session    Home Living                      Prior Function            PT Goals (current goals can now be found in the care plan  section) Acute Rehab PT Goals Patient Stated Goal: Pt ready anticipating DC to [facility] today.  PT Goal Formulation: With patient Time For Goal Achievement: 04/14/15 Potential to Achieve Goals: Good Progress towards PT goals: Progressing toward goals    Frequency  BID    PT Plan Current plan remains appropriate    Co-evaluation              End of Session Equipment Utilized During Treatment: Gait belt Activity Tolerance: Patient tolerated treatment well;No increased pain Patient left: in chair;with call bell/phone within reach;with chair alarm set     Time: [phone] PT Time Calculation (min) (ACUTE ONLY): 27 min  Charges:  $Therapeutic Exercise: 8-22 mins $Therapeutic Activity: 8-22 mins                    G Codes:      Tyden Kann C 04-11-15, 9:57 AM  9:58 AM  Etta Grandchild, PT, DPT [place] License # [id number]

## 2015-04-02 NOTE — Progress Notes (Signed)
CSW was notified that Pt has been medically cleared for dc to SNF [facility]. CSW prepared dc packet for transfer. RN to call report to SNF and EMS for transport. Pt stated that she will update her family with time frame for dc.   No further CSW needs at this time.   Toma Copier, [place]

## 2015-04-02 NOTE — Progress Notes (Addendum)
Pt discharged to [facility] place, called and gave report, husband took belongings, dressing changed to knee

## 2015-04-02 NOTE — Progress Notes (Signed)
   Subjective: 3 Days Post-Op Procedure(s) (LRB): COMPUTER ASSISTED TOTAL KNEE ARTHROPLASTY (Right) Patient reports pain as mild.   Patient is well, and has had no acute complaints or problems We will continue therapy today.  Plan is to go Rehab today  Objective: Vital signs in last 24 hours: Temp:  [97.9 F (36.6 C)-98.5 F (36.9 C)] 97.9 F (36.6 C) (10/01 0501) Pulse Rate:  [69-72] 69 (10/01 0501) Resp:  [18-20] 18 (10/01 0501) BP: (125-171)/(41-62) 158/62 mmHg (10/01 0501) SpO2:  [95 %-96 %] 95 % (10/01 0501)  Intake/Output from previous day: 09/30 0701 - 10/01 0700 In: 480 [P.O.:480] Out: 1276 [Urine:1275; Stool:1] Intake/Output this shift:     Recent Labs  03/31/15 0618 04/01/15 0517  HGB 11.2* 11.2*    Recent Labs  03/31/15 0618 04/01/15 0517  WBC 12.9* 15.8*  RBC 3.61* 3.66*  HCT 33.6* 33.5*  PLT 166 162    Recent Labs  03/31/15 0618  NA 135  K 3.5  CL 102  CO2 24  BUN 9  CREATININE 0.65  GLUCOSE 313*  CALCIUM 7.7*   No results for input(s): LABPT, INR in the last 72 hours.  EXAM General - Patient is Alert, Appropriate and Oriented Extremity - Neurovascular intact Sensation intact distally Intact pulses distally Dorsiflexion/Plantar flexion intact Dressing - dressing C/D/I and scant drainage Motor Function - intact, moving foot and toes well on exam.   Past Medical History  Diagnosis Date  . Hyperlipidemia   . Hypertension   . Sprain of lumbar region   . Thoracic or lumbosacral neuritis or radiculitis, unspecified   . Allergic rhinitis, cause unspecified   . Diabetes mellitus     type II  . 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 Dermatophytosis of nail   . Esophageal reflux   . Cellulitis and abscess of leg, except foot   . Urinary tract infection, site not specified   . Acute cystitis   . Conjunctivitis unspecified   . Bronchitis, not specified as acute or chronic   . 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 Renal artery stenosis   . Atherosclerosis of renal artery     left    Assessment/Plan:   3 Days Post-Op Procedure(s) (LRB): COMPUTER ASSISTED TOTAL KNEE ARTHROPLASTY (Right) Active Problems:   Right knee DJD   Total knee replacement status  Estimated body mass index is 35.27 kg/(m^2) as calculated from the following:   Height as of this encounter: 5\' 4"  (1.626 m).   Weight as of this encounter: 93.26 kg (205 lb 9.6 oz). Advance diet Up with therapy Discharge to SNF  Follow up with [facility] in 2 weeks  DVT Prophylaxis - Lovenox, Foot Pumps and TED hose Weight-Bearing as tolerated to right leg D/C O2 and Pulse OX and try on Room Air  T. Rachelle Hora, [name] [place] 04/02/2015, 7:17 AM

## 2015-04-02 NOTE — Clinical Social Work Placement (Signed)
   CLINICAL SOCIAL WORK PLACEMENT  NOTE  Date:  04/02/2015  Patient Details  Name: Bonnie Rowland MRN: [id number] Date of Birth: 10-May-1941  Clinical Social Work is seeking post-discharge placement for this patient at the [place] level of care (*CSW will initial, date and re-position this form in  chart as items are completed):  Yes   Patient/family provided with [place] Work Department's list of facilities offering this level of care within the geographic area requested by the patient (or if unable, by the patient's family).  Yes   Patient/family informed of their freedom to choose among providers that offer the needed level of care, that participate in Medicare, Medicaid or managed care program needed by the patient, have an available bed and are willing to accept the patient.  Yes   Patient/family informed of [facility]'s ownership interest in [facility] and [facility], as well as of the fact that they are under no obligation to receive care at these facilities.  PASRR submitted to EDS on 03/31/15     PASRR number received on 03/31/15     Existing PASRR number confirmed on       FL2 transmitted to all facilities in geographic area requested by pt/family on 03/31/15     FL2 transmitted to all facilities within larger geographic area on       Patient informed that his/her managed care company has contracts with or will negotiate with certain facilities, including the following:        Yes   Patient/family informed of bed offers received.  Patient chooses bed at  [facility] )     Physician recommends and patient chooses bed at      Patient to be transferred to  [facility]) on 04/02/15.  Patient to be transferred to facility by EMS     Patient family notified on 04/02/15 of transfer.  Name of family member notified:  Pt calling her family.      PHYSICIAN Please sign FL2     Additional Comment:     _______________________________________________ Alonna Buckler, LCSW 04/02/2015, 11:07 AM

## 2015-04-02 NOTE — Plan of Care (Signed)
Problem: Phase III Progression Outcomes Goal: Discharge plan remains appropriate-arrangements made Outcome: Completed/Met Date Met:  04/02/15 Pt plans on going to [place] at discharge. Goal: Anticoagulant follow-up in place Outcome: Completed/Met Date Met:  04/02/15 Pt currently receiving Lovenox injections

## 2015-04-04 DIAGNOSIS — M159 Polyosteoarthritis, unspecified: Secondary | ICD-10-CM | POA: Diagnosis not present

## 2015-04-04 DIAGNOSIS — M4800 Spinal stenosis, site unspecified: Secondary | ICD-10-CM | POA: Diagnosis not present

## 2015-04-04 DIAGNOSIS — E119 Type 2 diabetes mellitus without complications: Secondary | ICD-10-CM | POA: Diagnosis not present

## 2015-04-04 DIAGNOSIS — K5900 Constipation, unspecified: Secondary | ICD-10-CM | POA: Diagnosis not present

## 2015-04-04 DIAGNOSIS — I10 Essential (primary) hypertension: Secondary | ICD-10-CM | POA: Diagnosis not present

## 2015-04-04 LAB — GLUCOSE, CAPILLARY
GLUCOSE-CAPILLARY: 176 mg/dL — AB (ref 65–99)
GLUCOSE-CAPILLARY: 273 mg/dL — AB (ref 65–99)
GLUCOSE-CAPILLARY: 165 mg/dL — AB (ref 65–99)
GLUCOSE-CAPILLARY: 94 mg/dL (ref 65–99)
Glucose-Capillary: 151 mg/dL — ABNORMAL HIGH (ref 65–99)
Glucose-Capillary: 257 mg/dL — ABNORMAL HIGH (ref 65–99)

## 2015-04-05 LAB — GLUCOSE, CAPILLARY: Glucose-Capillary: 229 mg/dL — ABNORMAL HIGH (ref 65–99)

## 2015-04-05 NOTE — Anesthesia Postprocedure Evaluation (Signed)
  Anesthesia Post-op Note  Patient: Bonnie Rowland  Procedure(s) Performed: Procedure(s): COMPUTER ASSISTED TOTAL KNEE ARTHROPLASTY (Right)  Anesthesia type:MAC, Spinal  Patient location: PACU  Post pain: Pain level controlled  Post assessment: Post-op Vital signs reviewed, Patient's Cardiovascular Status Stable, Respiratory Function Stable, Patent Airway and No signs of Nausea or vomiting  Post vital signs: Reviewed and stable  Last Vitals:  Filed Vitals:   04/02/15 1425  BP: 144/48  Pulse: 71  Temp: 36.6 C  Resp: 18    Level of consciousness: awake, alert  and patient cooperative  Complications: No apparent anesthesia complications

## 2015-04-06 ENCOUNTER — Other Ambulatory Visit: Payer: Self-pay | Admitting: Family Medicine

## 2015-04-06 DIAGNOSIS — K649 Unspecified hemorrhoids: Secondary | ICD-10-CM

## 2015-04-06 LAB — GLUCOSE, CAPILLARY
GLUCOSE-CAPILLARY: 141 mg/dL — AB (ref 65–99)
GLUCOSE-CAPILLARY: 281 mg/dL — AB (ref 65–99)
GLUCOSE-CAPILLARY: 280 mg/dL — AB (ref 65–99)
Glucose-Capillary: 164 mg/dL — ABNORMAL HIGH (ref 65–99)
Glucose-Capillary: 305 mg/dL — ABNORMAL HIGH (ref 65–99)
Glucose-Capillary: 48 mg/dL — ABNORMAL LOW (ref 65–99)
Glucose-Capillary: 81 mg/dL (ref 65–99)
Glucose-Capillary: 113 mg/dL — ABNORMAL HIGH (ref 65–99)
Glucose-Capillary: 120 mg/dL — ABNORMAL HIGH (ref 65–99)

## 2015-04-06 LAB — POC HEMOCCULT BLD/STL (HOME/3-CARD/SCREEN)
Card #2 Fecal Occult Blod, POC: NEGATIVE
FECAL OCCULT BLD: NEGATIVE
FECAL OCCULT BLD: NEGATIVE

## 2015-04-07 LAB — GLUCOSE, CAPILLARY
Glucose-Capillary: 295 mg/dL — ABNORMAL HIGH (ref 65–99)
Glucose-Capillary: 150 mg/dL — ABNORMAL HIGH (ref 65–99)
Glucose-Capillary: 250 mg/dL — ABNORMAL HIGH (ref 65–99)
Glucose-Capillary: 173 mg/dL — ABNORMAL HIGH (ref 65–99)

## 2015-04-08 DIAGNOSIS — E113293 Type 2 diabetes mellitus with mild nonproliferative diabetic retinopathy without macular edema, bilateral: Secondary | ICD-10-CM | POA: Diagnosis not present

## 2015-04-08 LAB — GLUCOSE, CAPILLARY: Glucose-Capillary: 277 mg/dL — ABNORMAL HIGH (ref 65–99)

## 2015-04-09 LAB — GLUCOSE, CAPILLARY
GLUCOSE-CAPILLARY: 265 mg/dL — AB (ref 65–99)
GLUCOSE-CAPILLARY: 123 mg/dL — AB (ref 65–99)
GLUCOSE-CAPILLARY: 220 mg/dL — AB (ref 65–99)
Glucose-Capillary: 179 mg/dL — ABNORMAL HIGH (ref 65–99)
Glucose-Capillary: 228 mg/dL — ABNORMAL HIGH (ref 65–99)
Glucose-Capillary: 175 mg/dL — ABNORMAL HIGH (ref 65–99)
Glucose-Capillary: 256 mg/dL — ABNORMAL HIGH (ref 65–99)

## 2015-04-10 LAB — GLUCOSE, CAPILLARY
GLUCOSE-CAPILLARY: 146 mg/dL — AB (ref 65–99)
GLUCOSE-CAPILLARY: 114 mg/dL — AB (ref 65–99)
Glucose-Capillary: 211 mg/dL — ABNORMAL HIGH (ref 65–99)

## 2015-04-11 LAB — GLUCOSE, CAPILLARY
Glucose-Capillary: 222 mg/dL — ABNORMAL HIGH (ref 65–99)
Glucose-Capillary: 95 mg/dL (ref 65–99)
Glucose-Capillary: 73 mg/dL (ref 65–99)
Glucose-Capillary: 204 mg/dL — ABNORMAL HIGH (ref 65–99)

## 2015-04-12 LAB — GLUCOSE, CAPILLARY
GLUCOSE-CAPILLARY: 124 mg/dL — AB (ref 65–99)
GLUCOSE-CAPILLARY: 92 mg/dL (ref 65–99)
Glucose-Capillary: 140 mg/dL — ABNORMAL HIGH (ref 65–99)
Glucose-Capillary: 193 mg/dL — ABNORMAL HIGH (ref 65–99)
Glucose-Capillary: 111 mg/dL — ABNORMAL HIGH (ref 65–99)

## 2015-04-13 LAB — GLUCOSE, CAPILLARY
GLUCOSE-CAPILLARY: 105 mg/dL — AB (ref 65–99)
GLUCOSE-CAPILLARY: 182 mg/dL — AB (ref 65–99)

## 2015-04-14 LAB — GLUCOSE, CAPILLARY
GLUCOSE-CAPILLARY: 148 mg/dL — AB (ref 65–99)
GLUCOSE-CAPILLARY: 225 mg/dL — AB (ref 65–99)
Glucose-Capillary: 69 mg/dL (ref 65–99)
Glucose-Capillary: 100 mg/dL — ABNORMAL HIGH (ref 65–99)
Glucose-Capillary: 123 mg/dL — ABNORMAL HIGH (ref 65–99)

## 2015-04-15 LAB — GLUCOSE, CAPILLARY: Glucose-Capillary: 134 mg/dL — ABNORMAL HIGH (ref 65–99)

## 2015-04-16 LAB — GLUCOSE, CAPILLARY
GLUCOSE-CAPILLARY: 64 mg/dL — AB (ref 65–99)
GLUCOSE-CAPILLARY: 171 mg/dL — AB (ref 65–99)
Glucose-Capillary: 114 mg/dL — ABNORMAL HIGH (ref 65–99)

## 2015-04-17 LAB — GLUCOSE, CAPILLARY
Glucose-Capillary: 80 mg/dL (ref 65–99)
Glucose-Capillary: 187 mg/dL — ABNORMAL HIGH (ref 65–99)
Glucose-Capillary: 94 mg/dL (ref 65–99)

## 2015-04-18 LAB — GLUCOSE, CAPILLARY
GLUCOSE-CAPILLARY: 114 mg/dL — AB (ref 65–99)
GLUCOSE-CAPILLARY: 86 mg/dL (ref 65–99)
GLUCOSE-CAPILLARY: 159 mg/dL — AB (ref 65–99)
Glucose-Capillary: 87 mg/dL (ref 65–99)
Glucose-Capillary: 182 mg/dL — ABNORMAL HIGH (ref 65–99)
Glucose-Capillary: 187 mg/dL — ABNORMAL HIGH (ref 65–99)

## 2015-04-19 LAB — GLUCOSE, CAPILLARY
GLUCOSE-CAPILLARY: 118 mg/dL — AB (ref 65–99)
Glucose-Capillary: 143 mg/dL — ABNORMAL HIGH (ref 65–99)
Glucose-Capillary: 141 mg/dL — ABNORMAL HIGH (ref 65–99)
Glucose-Capillary: 166 mg/dL — ABNORMAL HIGH (ref 65–99)

## 2015-04-20 LAB — GLUCOSE, CAPILLARY
GLUCOSE-CAPILLARY: 77 mg/dL (ref 65–99)
Glucose-Capillary: 228 mg/dL — ABNORMAL HIGH (ref 65–99)

## 2015-04-21 DIAGNOSIS — Z96651 Presence of right artificial knee joint: Secondary | ICD-10-CM | POA: Diagnosis not present

## 2015-04-22 DIAGNOSIS — Z96651 Presence of right artificial knee joint: Secondary | ICD-10-CM | POA: Diagnosis not present

## 2015-04-25 DIAGNOSIS — Z96651 Presence of right artificial knee joint: Secondary | ICD-10-CM | POA: Diagnosis not present

## 2015-04-27 DIAGNOSIS — Z96651 Presence of right artificial knee joint: Secondary | ICD-10-CM | POA: Diagnosis not present

## 2015-04-29 ENCOUNTER — Ambulatory Visit (INDEPENDENT_AMBULATORY_CARE_PROVIDER_SITE_OTHER): Payer: Medicare Other | Admitting: Cardiovascular Disease

## 2015-04-29 ENCOUNTER — Encounter: Payer: Self-pay | Admitting: Cardiovascular Disease

## 2015-04-29 VITALS — BP 138/60 | HR 72 | Ht 64.0 in | Wt 198.2 lb

## 2015-04-29 DIAGNOSIS — E785 Hyperlipidemia, unspecified: Secondary | ICD-10-CM

## 2015-04-29 DIAGNOSIS — I10 Essential (primary) hypertension: Secondary | ICD-10-CM | POA: Diagnosis not present

## 2015-04-29 DIAGNOSIS — IMO0001 Reserved for inherently not codable concepts without codable children: Secondary | ICD-10-CM

## 2015-04-29 DIAGNOSIS — E1340 Other specified diabetes mellitus with diabetic neuropathy, unspecified: Secondary | ICD-10-CM

## 2015-04-29 DIAGNOSIS — I4892 Unspecified atrial flutter: Secondary | ICD-10-CM

## 2015-04-29 DIAGNOSIS — E1165 Type 2 diabetes mellitus with hyperglycemia: Secondary | ICD-10-CM | POA: Diagnosis not present

## 2015-04-29 DIAGNOSIS — Z96651 Presence of right artificial knee joint: Secondary | ICD-10-CM | POA: Diagnosis not present

## 2015-04-29 MED ORDER — METOPROLOL SUCCINATE ER 25 MG PO TB24: 25.0000 mg | ORAL_TABLET | Freq: Every day | ORAL | Status: DC

## 2015-04-29 MED ORDER — FLECAINIDE ACETATE 50 MG PO TABS: 50.0000 mg | ORAL_TABLET | Freq: Two times a day (BID) | ORAL | Status: DC

## 2015-04-29 NOTE — Assessment & Plan Note (Signed)
We have encouraged continued exercise, careful diet management in an effort to lose weight. 

## 2015-04-29 NOTE — Assessment & Plan Note (Signed)
Blood pressure is well controlled on today's visit. No changes made to the medications. 

## 2015-04-29 NOTE — Assessment & Plan Note (Signed)
Cholesterol is at goal on the current lipid regimen. No changes to the medications were made.  

## 2015-04-29 NOTE — Progress Notes (Signed)
Patient ID: Bonnie Rowland, female    DOB: [birth year], 74 y.o.   MRN: [id number]  HPI Comments: Bonnie Rowland is a very pleasant 74 year old woman with a history of chest pain, normal coronary arteries by cardiac catheterization April 2007 ,  < 50%  renal artery disease,  hyperlipidemia, diabetes, hypertension, obesity who presents for evaluation of recent episode of atrial flutter, status post right total knee replacement surgery  She reports that she underwent successful knee replacement surgery at the end of September 2016. Spent several weeks at [facility] rehabilitation. Currently doing PT from home, walks with a walker. Feels that she is on track, concerned about some redness around the incision site, swelling And possible cellulitis. Denies any tachycardia or palpitations concerning for atrial flutter or other arrhythmia Denies any shortness of breath She is wearing compression hose up to her thighs She did have Lovenox following surgery, now on baby dose aspirin She continues on flecainide and metoprolol for rhythm control  EKG on today's visit shows normal sinus rhythm with rate 72 bpm, no significant ST or T-wave changes, rare APC  Other past medical history 02/21/15 she presented to [facility] for right total knee replacement by Dr. Marry Guan. She reported developing tachycardia that morning. She had extreme pain, was rushing to get to the hospital. EKG showed atrial flutter with ventricular rate 140 bpm. She was sent to the emergency room, 3 hours later was back in normal sinus rhythm. No medications were given to restore normal sinus rhythm.  Hemoglobin A1c increased on prednisone, into the 8 range, now improved in the 7 range  Previous lab work Total cholesterol 120, LDL in the 50s  She also has a history of sciatica and a meniscus tear on the left. She had a echocardiogram in May 2006 which was essentially normal with ejection fraction 60%.   Stress test in 06, 03 and 01 had been  normal.   Allergies  Allergen Reactions  . Cheese Other (See Comments)    bloating  . Ciprofloxacin Hcl Other (See Comments)    Muscle pain    Current Outpatient Prescriptions on File Prior to Visit  Medication Sig Dispense Refill  . ALFALFA PO Take 3 tablets by mouth every morning.    Marland Kitchen amoxicillin-clavulanate (AUGMENTIN) 875-125 MG per tablet Take 1 tablet by mouth. As needed for dental work or sinus infection.    Marland Kitchen aspirin 81 MG EC tablet Take 81 mg by mouth at bedtime.     . B Complex-C (B-COMPLEX WITH VITAMIN C) tablet Take 2 tablets by mouth at bedtime.     . cholecalciferol (VITAMIN D) 1000 UNITS tablet Take 4,000 Units by mouth daily.     . clobetasol ointment (TEMOVATE) 7.40 % Apply 1 application topically 2 (two) times daily as needed (bug bites).    . Coenzyme Q10 (COQ10) 200 MG CAPS Take 1 tablet by mouth daily. 400 mg every am.    . cyclobenzaprine (FLEXERIL) 5 MG tablet Take 1 tablet (5 mg total) by mouth 2 (two) times daily. 180 tablet 1  . estradiol (ESTRACE VAGINAL) 0.1 MG/GM vaginal cream 1 application 2 (two) times a week.     . fish oil-omega-3 fatty acids 1000 MG capsule Take 2 g by mouth daily.      . flecainide (TAMBOCOR) 50 MG tablet Take 1 tablet (50 mg total) by mouth 2 (two) times daily. 60 tablet 6  . fluconazole (DIFLUCAN) 150 MG tablet Take 150 mg by mouth as needed.    Marland Kitchen  GARLIC 9371 PO Take 1 tablet by mouth daily. 1 gram    . Glucosamine 500 MG TABS Take 1 tablet by mouth daily.     Marland Kitchen glucose blood (ONE TOUCH ULTRA TEST) test strip Use as instructed 50 each 12  . HYDROcodone-acetaminophen (NORCO) 10-325 MG per tablet Take 1 tablet by mouth every 8 (eight) hours as needed. 270 tablet 0  . HYDROcodone-acetaminophen (NORCO) 10-325 MG per tablet Take 1 tablet by mouth every 8 (eight) hours as needed. 270 tablet 0  . Hydrocodone-Acetaminophen 10-300 MG TABS Take 1 tablet by mouth every 8 (eight) hours as needed. 270 each 0  . Insulin Glargine (LANTUS  SOLOSTAR) 100 UNIT/ML Solostar Pen Inject 28 Units into the skin at bedtime. 15 pen PRN  . Insulin Pen Needle 31G X 5 MM MISC 1 pen by Does not apply route 2 (two) times daily. 90 each 12  . Lancets (ONETOUCH ULTRASOFT) lancets Use as instructed 100 each 12  . LECITHIN PO Take 1 capsule by mouth every other day.    . Liraglutide (VICTOZA) 18 MG/3ML SOPN Inject 0.2 mLs (1.2 mg total) into the skin once. (Patient taking differently: Inject 1.2 mg into the skin once. Every 8 am) 6 pen 1  . losartan-hydrochlorothiazide (HYZAAR) 100-25 MG per tablet Take 1 tablet by mouth daily. (Patient taking differently: Take 1 tablet by mouth daily. Every am) 90 tablet 1  . MAGNESIUM-POTASSIUM PO Take 1 tablet by mouth 2 (two) times daily. Magnesium 300 mg potassium 40 mg in each tablet.    . metaxalone (SKELAXIN) 800 MG tablet Take 1 tablet (800 mg total) by mouth 3 (three) times daily. (Patient taking differently: Take 800 mg by mouth daily. ) 270 tablet 0  . metformin (FORTAMET) 500 MG (OSM) 24 hr tablet Take 2 tablets (1,000 mg total) by mouth 2 (two) times daily with a meal. 360 tablet 1  . metoprolol succinate (TOPROL XL) 25 MG 24 hr tablet Take 1 tablet (25 mg total) by mouth daily. (Patient taking differently: Take 25 mg by mouth every morning. ) 90 tablet 3  . Multiple Vitamin (MULTIVITAMIN) tablet Take 1 tablet by mouth daily.     . Multiple Vitamins-Minerals (PRESERVISION AREDS 2) CAPS Take 1 capsule by mouth 2 (two) times daily.    . NON FORMULARY Take 1 tablet by mouth 2 (two) times daily. neutroferon vitamin daily.    Marland Kitchen omeprazole (PRILOSEC) 20 MG capsule Take 20 mg capsule every other day (Patient taking differently: 20 mg every morning. Take 20 mg capsule every other day) 90 capsule 1  . oxyCODONE (OXY IR/ROXICODONE) 5 MG immediate release tablet Take 1-2 tablets (5-10 mg total) by mouth every 4 (four) hours as needed for severe pain or breakthrough pain. 60 tablet 0  . Probiotic Product (PROBIOTIC PO)  Take 1 Dose by mouth at bedtime.    Marland Kitchen Propylene Glycol (SYSTANE BALANCE OP) Apply 1 drop to eye as needed.    . raloxifene (EVISTA) 60 MG tablet Take 1 tablet (60 mg total) by mouth daily. (Patient taking differently: Take 60 mg by mouth every morning. ) 90 tablet 1  . rosuvastatin (CRESTOR) 10 MG tablet Take 1 tablet (10 mg total) by mouth daily. (Patient taking differently: Take 10 mg by mouth at bedtime. ) 90 tablet 1  . sulfacetamide (BLEPH-10) 10 % ophthalmic solution Place 1 drop into both eyes 3 (three) times daily as needed.    . vitamin C (ASCORBIC ACID) 500 MG tablet Take 500  mg by mouth 2 (two) times daily.    . vitamin E (VITAMIN E) 400 UNIT capsule Take 400 Units by mouth daily. Every am     No current facility-administered medications on file prior to visit.    Past Medical History  Diagnosis Date  . Hyperlipidemia   . Hypertension   . Sprain of lumbar region   . Thoracic or lumbosacral neuritis or radiculitis, unspecified   . Allergic rhinitis, cause unspecified   . Diabetes mellitus     type II  . 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 Dermatophytosis of nail   . Esophageal reflux   . Cellulitis and abscess of leg, except foot   . Urinary tract infection, site not specified   . Acute cystitis   . Conjunctivitis unspecified   . Bronchitis, not specified as acute or chronic   . 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 Cancer ([place]) 05/2014    basal cell removed left temple  . Renal artery stenosis ([place])   . Atherosclerosis of renal artery [facility])     left    Past Surgical History  Procedure Laterality Date  . Appendectomy    . Colonoscopy    . Diagnostic mammogram    . Cataract surgery    . Tonsillectomy    . Eye surgery Bilateral     Cataract Extraction  . Knee arthroscopy Right   . Cardiac catheterization    . Knee arthroplasty Right 03/30/2015    Procedure: COMPUTER ASSISTED TOTAL KNEE ARTHROPLASTY;  Surgeon: Dereck Leep, MD;   Location: [facility] ORS;  Service: Orthopedics;  Laterality: Right;    Social History  reports that she has never smoked. She has never used smokeless tobacco. She reports that she does not drink alcohol or use illicit drugs.  Family History family history includes Cancer in her father and mother; Diabetes in her mother; Hypertension in her father and mother.   Review of Systems  Constitutional: Negative.   Respiratory: Negative.   Cardiovascular: Negative.   Gastrointestinal: Negative.   Musculoskeletal: Positive for arthralgias.  Neurological: Negative.   Hematological: Negative.   Psychiatric/Behavioral: Negative.   All other systems reviewed and are negative.   BP 138/60 mmHg  Pulse 72  Ht 5\' 4"  (1.626 m)  Wt 198 lb 4 oz (89.926 kg)  BMI 34.01 kg/m2  Physical Exam  Constitutional: She is oriented to person, place, and time. She appears well-developed and well-nourished.  HENT:  Head: Normocephalic.  Nose: Nose normal.  Mouth/Throat: Oropharynx is clear and moist.  Eyes: Conjunctivae are normal. Pupils are equal, round, and reactive to light.  Neck: Normal range of motion. Neck supple. No JVD present.  Cardiovascular: Normal rate, regular rhythm, S1 normal, S2 normal, normal heart sounds and intact distal pulses.  Exam reveals no gallop and no friction rub.   No murmur heard. Pulmonary/Chest: Effort normal and breath sounds normal. No respiratory distress. She has no wheezes. She has no rales. She exhibits no tenderness.  Abdominal: Soft. Bowel sounds are normal. She exhibits no distension. There is no tenderness.  Musculoskeletal: Normal range of motion. She exhibits no edema or tenderness.  Lymphadenopathy:    She has no cervical adenopathy.  Neurological: She is alert and oriented to person, place, and time. Coordination normal.  Skin: Skin is warm and dry. No rash noted. No erythema.  Psychiatric: She has a normal mood and affect. Her behavior is normal. Judgment and  thought content  normal.    Assessment and Plan  Nursing note and vitals reviewed.

## 2015-04-29 NOTE — Assessment & Plan Note (Signed)
Managed by primary care. 

## 2015-04-29 NOTE — Assessment & Plan Note (Signed)
Recommended that she continue her current medications for now. Would hold the flecainide only after she is off her pain medications and done with PT Would stay on metoprolol

## 2015-04-29 NOTE — Assessment & Plan Note (Signed)
Right knee evaluated, minimal swelling, minimal erythema Low risk of cellulitis She has  follow-up with Dr. Marry Guan. Recommended she call her office if erythema changes dramatically

## 2015-04-29 NOTE — Patient Instructions (Signed)
You are doing well.  Please hold the flecainide after PT has finished and you are off most of the pain pills  Stay on metoprolol for now  Please call us if you have new issues that need to be addressed before your next appt.  Your physician wants you to follow-up in: 6 months.  You will receive a reminder letter in the mail two months in advance. If you don't receive a letter, please call our office to schedule the follow-up appointment.

## 2015-05-02 ENCOUNTER — Other Ambulatory Visit: Payer: Self-pay | Admitting: Orthopedic Surgery

## 2015-05-02 ENCOUNTER — Ambulatory Visit
Admission: RE | Admit: 2015-05-02 | Discharge: 2015-05-02 | Disposition: A | Discharge status: Home / Self Care | Payer: Medicare Other | Source: Ambulatory Visit | Attending: Orthopedic Surgery | Admitting: Orthopedic Surgery

## 2015-05-02 DIAGNOSIS — R2241 Localized swelling, mass and lump, right lower limb: Secondary | ICD-10-CM | POA: Diagnosis not present

## 2015-05-02 DIAGNOSIS — Z96651 Presence of right artificial knee joint: Secondary | ICD-10-CM | POA: Insufficient documentation

## 2015-05-02 DIAGNOSIS — M79661 Pain in right lower leg: Secondary | ICD-10-CM

## 2015-05-02 DIAGNOSIS — M7989 Other specified soft tissue disorders: Secondary | ICD-10-CM | POA: Diagnosis not present

## 2015-05-04 DIAGNOSIS — Z96651 Presence of right artificial knee joint: Secondary | ICD-10-CM | POA: Diagnosis not present

## 2015-05-06 DIAGNOSIS — Z96651 Presence of right artificial knee joint: Secondary | ICD-10-CM | POA: Diagnosis not present

## 2015-05-09 ENCOUNTER — Telehealth: Payer: Self-pay | Admitting: Family Medicine

## 2015-05-09 DIAGNOSIS — Z96651 Presence of right artificial knee joint: Secondary | ICD-10-CM | POA: Diagnosis not present

## 2015-05-09 NOTE — Telephone Encounter (Signed)
Requesting that we rebill DOS 03-15-15. They only have Medicare A&B a. They do not have any other insurance

## 2015-05-11 DIAGNOSIS — Z96651 Presence of right artificial knee joint: Secondary | ICD-10-CM | POA: Diagnosis not present

## 2015-05-13 DIAGNOSIS — Z96651 Presence of right artificial knee joint: Secondary | ICD-10-CM | POA: Diagnosis not present

## 2015-05-16 ENCOUNTER — Encounter: Payer: Self-pay | Admitting: Family Medicine

## 2015-05-16 ENCOUNTER — Other Ambulatory Visit: Payer: Self-pay | Admitting: Family Medicine

## 2015-05-16 ENCOUNTER — Ambulatory Visit (INDEPENDENT_AMBULATORY_CARE_PROVIDER_SITE_OTHER): Payer: Medicare Other | Admitting: Family Medicine

## 2015-05-16 VITALS — BP 138/62 | HR 66 | Temp 98.7°F | Resp 18 | Ht 64.0 in | Wt 186.2 lb

## 2015-05-16 DIAGNOSIS — E785 Hyperlipidemia, unspecified: Secondary | ICD-10-CM

## 2015-05-16 DIAGNOSIS — I10 Essential (primary) hypertension: Secondary | ICD-10-CM

## 2015-05-16 DIAGNOSIS — E1169 Type 2 diabetes mellitus with other specified complication: Secondary | ICD-10-CM

## 2015-05-16 DIAGNOSIS — Z96651 Presence of right artificial knee joint: Secondary | ICD-10-CM | POA: Diagnosis not present

## 2015-05-16 LAB — POCT GLYCOSYLATED HEMOGLOBIN (HGB A1C): Hemoglobin A1C: 6.3

## 2015-05-16 LAB — GLUCOSE, POCT (MANUAL RESULT ENTRY): POC Glucose: 187 mg/dl — AB (ref 70–99)

## 2015-05-16 MED ORDER — HYDROCODONE-ACETAMINOPHEN 10-325 MG PO TABS: 1.0000 | ORAL_TABLET | Freq: Three times a day (TID) | ORAL | Status: DC | PRN

## 2015-05-16 NOTE — Progress Notes (Signed)
Name: Bonnie Rowland   MRN: [id number]    DOB: 04-11-1941   Date:05/16/2015       Progress Note  Subjective  Chief Complaint  Chief Complaint  Patient presents with  . Diabetes  . Hypertension  . Hyperlipidemia    HPI  Diabeteslosartan 1-20   Patient presents for follow-up of diabetes which is present for over 5 years. Is currently on a regimen of metformin and Lantus and the victoza . Patient states she is compliant with their diet and exercise. There's been no hypoglycemic episodes and there had been no polyuria polydipsia polyphagia. His average fasting glucoses been in the low around - with a high around - . There is no end organ disease.  Last diabetic eye exam was earlier this year.   Last visit with dietitian was earlier this year. Last microalbumin was obtained  January 2016 and was normal at 20 .   Hypertension   Patient presents for follow-up of hypertension. It has been present for over 5 years.  Patient states that there is compliance with medical regimen which consists of losartan 100-25 once daily metoprolol 25 mg once daily . There is no end organ disease. Cardiac risk factors include hypertension hyperlipidemia and diabetes.  Exercise regimen consist of limited secondary to recent surgery low-sodium.  Diet consist of low-sodium .  Hyperlipidemia  Patient has a history of hyperlipidemia for  over 5 years.  Current medical regimen consist of Crestor 10 mg by mouth daily at bedtime .  Compliance is good .  Diet and exercise are currently followed as well as possible .  Risk factors for cardiovascular disease include hyperlipidemia hypertension and diabetes as well as obesity .   There have been no side effects from the medication.    Osteoarthritis  Patient had right knee surgical since her last visit here. She has been through PT and has been recuperating well. Pain is mild at this time.      Past Medical History  Diagnosis Date  . Hyperlipidemia   .  Hypertension   . Sprain of lumbar region   . Thoracic or lumbosacral neuritis or radiculitis, unspecified   . Allergic rhinitis, cause unspecified   . Diabetes mellitus     type II  . 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 Dermatophytosis of nail   . Esophageal reflux   . Cellulitis and abscess of leg, except foot   . Urinary tract infection, site not specified   . Acute cystitis   . Conjunctivitis unspecified   . Bronchitis, not specified as acute or chronic   . 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 Renal artery stenosis ([place])   . 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 Smoking status: Never Smoker   . Smokeless tobacco: Never Used  . Alcohol Use: No     Comment: occasional     Current outpatient prescriptions:  .  ALFALFA PO, Take 3 tablets by mouth every morning., Disp: , Rfl:  .  amoxicillin-clavulanate (AUGMENTIN) 875-125 MG per tablet, Take 1 tablet by mouth. As needed for dental work or sinus infection., Disp: , Rfl:  .  aspirin 81 MG EC tablet, Take 81 mg by mouth at bedtime. , Disp: , Rfl:  .  B Complex-C (B-COMPLEX WITH VITAMIN C) tablet, Take  2 tablets by mouth at bedtime. , Disp: , Rfl:  .  cholecalciferol (VITAMIN D) 1000 UNITS tablet, Take 4,000 Units by mouth daily. , Disp: , Rfl:  .  clobetasol ointment (TEMOVATE) [id number] %, Apply 1 application topically 2 (two) times daily as needed (bug bites)., Disp: , Rfl:  .  Coenzyme Q10 (COQ10) 200 MG CAPS, Take 1 tablet by mouth daily. 400 mg every am., Disp: , Rfl:  .  cyclobenzaprine (FLEXERIL) 5 MG tablet, Take 1 tablet (5 mg total) by mouth 2 (two) times daily., Disp: 180 tablet, Rfl: 1 .  estradiol (ESTRACE VAGINAL) 0.1 MG/GM vaginal cream, 1 application 2 (two) times a week. , Disp: , Rfl:  .  fish oil-omega-3 fatty acids 1000 MG capsule, Take 2 g by mouth daily.  , Disp: , Rfl:  .   flecainide (TAMBOCOR) 50 MG tablet, Take 1 tablet (50 mg total) by mouth 2 (two) times daily., Disp: 180 tablet, Rfl: 3 .  fluconazole (DIFLUCAN) 150 MG tablet, Take 150 mg by mouth as needed., Disp: , Rfl:  .  GARLIC 99991111 PO, Take 1 tablet by mouth daily. 1 gram, Disp: , Rfl:  .  Glucosamine 500 MG TABS, Take 1 tablet by mouth daily. , Disp: , Rfl:  .  glucose blood (ONE TOUCH ULTRA TEST) test strip, Use as instructed, Disp: 50 each, Rfl: 12 .  HYDROcodone-acetaminophen (NORCO) 10-325 MG per tablet, Take 1 tablet by mouth every 8 (eight) hours as needed., Disp: 270 tablet, Rfl: 0 .  HYDROcodone-acetaminophen (NORCO) 10-325 MG per tablet, Take 1 tablet by mouth every 8 (eight) hours as needed., Disp: 270 tablet, Rfl: 0 .  HYDROcodone-acetaminophen (NORCO/VICODIN) 5-325 MG tablet, Take by mouth., Disp: , Rfl:  .  Hydrocodone-Acetaminophen 10-300 MG TABS, Take 1 tablet by mouth every 8 (eight) hours as needed., Disp: 270 each, Rfl: 0 .  Insulin Glargine (LANTUS SOLOSTAR) 100 UNIT/ML Solostar Pen, Inject 28 Units into the skin at bedtime., Disp: 15 pen, Rfl: PRN .  Insulin Pen Needle 31G X 5 MM MISC, 1 pen by Does not apply route 2 (two) times daily., Disp: 90 each, Rfl: 12 .  KLOR-CON M10 10 MEQ tablet, TAKE 1 TABLET BY MOUTH DAILY, Disp: 30 tablet, Rfl: 0 .  Lancets (ONETOUCH ULTRASOFT) lancets, Use as instructed, Disp: 100 each, Rfl: 12 .  LECITHIN PO, Take 1 capsule by mouth every other day., Disp: , Rfl:  .  Liraglutide (VICTOZA) 18 MG/3ML SOPN, Inject 0.2 mLs (1.2 mg total) into the skin once. (Patient taking differently: Inject 1.2 mg into the skin once. Every 8 am), Disp: 6 pen, Rfl: 1 .  losartan-hydrochlorothiazide (HYZAAR) 100-25 MG per tablet, Take 1 tablet by mouth daily. (Patient taking differently: Take 1 tablet by mouth daily. Every am), Disp: 90 tablet, Rfl: 1 .  MAGNESIUM-POTASSIUM PO, Take 1 tablet by mouth 2 (two) times daily. Magnesium 300 mg potassium 40 mg in each tablet., Disp: ,  Rfl:  .  metaxalone (SKELAXIN) 800 MG tablet, Take 1 tablet (800 mg total) by mouth 3 (three) times daily. (Patient taking differently: Take 800 mg by mouth daily. ), Disp: 270 tablet, Rfl: 0 .  metformin (FORTAMET) 500 MG (OSM) 24 hr tablet, Take 2 tablets (1,000 mg total) by mouth 2 (two) times daily with a meal., Disp: 360 tablet, Rfl: 1 .  methocarbamol (ROBAXIN) 500 MG tablet, TAKE 1 TABLET BY MOUTH TWICE A DAY AS NEEDED FOR MUSCLE SPASM, Disp: , Rfl: 0 .  metoprolol  succinate (TOPROL XL) 25 MG 24 hr tablet, Take 1 tablet (25 mg total) by mouth daily., Disp: 90 tablet, Rfl: 3 .  Multiple Vitamin (MULTIVITAMIN) tablet, Take 1 tablet by mouth daily. , Disp: , Rfl:  .  Multiple Vitamins-Minerals (PRESERVISION AREDS 2) CAPS, Take 1 capsule by mouth 2 (two) times daily., Disp: , Rfl:  .  NON FORMULARY, Take 1 tablet by mouth 2 (two) times daily. neutroferon vitamin daily., Disp: , Rfl:  .  omeprazole (PRILOSEC) 20 MG capsule, Take 20 mg capsule every other day (Patient taking differently: 20 mg every morning. Take 20 mg capsule every other day), Disp: 90 capsule, Rfl: 1 .  oxyCODONE (OXY IR/ROXICODONE) 5 MG immediate release tablet, Take 1-2 tablets (5-10 mg total) by mouth every 4 (four) hours as needed for severe pain or breakthrough pain., Disp: 60 tablet, Rfl: 0 .  Probiotic Product (PROBIOTIC PO), Take 1 Dose by mouth at bedtime., Disp: , Rfl:  .  Propylene Glycol (SYSTANE BALANCE OP), Apply 1 drop to eye as needed., Disp: , Rfl:  .  raloxifene (EVISTA) 60 MG tablet, Take 1 tablet (60 mg total) by mouth daily. (Patient taking differently: Take 60 mg by mouth every morning. ), Disp: 90 tablet, Rfl: 1 .  rosuvastatin (CRESTOR) 10 MG tablet, Take 1 tablet (10 mg total) by mouth daily. (Patient taking differently: Take 10 mg by mouth at bedtime. ), Disp: 90 tablet, Rfl: 1 .  sulfacetamide (BLEPH-10) 10 % ophthalmic solution, Place 1 drop into both eyes 3 (three) times daily as needed., Disp: , Rfl:   .  vitamin C (ASCORBIC ACID) 500 MG tablet, Take 500 mg by mouth 2 (two) times daily., Disp: , Rfl:  .  vitamin E (VITAMIN E) 400 UNIT capsule, Take 400 Units by mouth daily. Every am, Disp: , Rfl:   Allergies  Allergen Reactions  . Cyclobenzaprine Hypertension  . Cheese Other (See Comments)    bloating  . Ciprofloxacin Hcl Other (See Comments)    Muscle pain    Review of Systems  Constitutional: Negative for fever, chills and weight loss.  HENT: Negative for congestion, hearing loss, sore throat and tinnitus.   Eyes: Negative for blurred vision, double vision and redness.  Respiratory: Negative for cough, hemoptysis and shortness of breath.   Cardiovascular: Negative for chest pain, palpitations, orthopnea, claudication and leg swelling.  Gastrointestinal: Negative for heartburn, nausea, vomiting, diarrhea, constipation and blood in stool.  Genitourinary: Negative for dysuria, urgency, frequency and hematuria.  Musculoskeletal: Positive for joint pain. Negative for myalgias, back pain, falls and neck pain.  Skin: Negative for itching.  Neurological: Negative for dizziness, tingling, tremors, focal weakness, seizures, loss of consciousness, weakness and headaches.  Endo/Heme/Allergies: Does not bruise/bleed easily.  Psychiatric/Behavioral: Negative for depression and substance abuse. The patient is not nervous/anxious and does not have insomnia.      Objective  Filed Vitals:   05/16/15 0827  BP: 138/62  Pulse: 66  Temp: 98.7 F (37.1 C)  Resp: 18  Height: 5\' 4"  (1.626 m)  Weight: 186 lb 3 oz (84.454 kg)  SpO2: 94%     Physical Exam  Constitutional: She is oriented to person, place, and time.  Obese in no acute distress  HENT:  Head: Normocephalic.  Eyes: EOM are normal. Pupils are equal, round, and reactive to light.  Neck: Normal range of motion. No thyromegaly present.  Cardiovascular: Normal rate, regular rhythm and normal heart sounds.   No murmur  heard. Pulmonary/Chest: Effort  normal and breath sounds normal.  Abdominal: Soft. Bowel sounds are normal.  Musculoskeletal: She exhibits no edema.  All surgical repair scar the knee which is healing well  Neurological: She is alert and oriented to person, place, and time. No cranial nerve deficit. Gait normal.  Skin: Skin is warm and dry. No rash noted.  Psychiatric: Memory and affect normal.      Assessment & Plan   1. Type 2 diabetes mellitus with other specified complication (HCC) Well-controlled - POCT Glucose (CBG) - POCT HgB A1C  2. Essential hypertension Well-controlled - Comprehensive Metabolic Panel (CMET)  3. Hyperlipidemia Repeat labs - Lipid Profile - TSH

## 2015-05-17 DIAGNOSIS — E785 Hyperlipidemia, unspecified: Secondary | ICD-10-CM | POA: Diagnosis not present

## 2015-05-17 DIAGNOSIS — I10 Essential (primary) hypertension: Secondary | ICD-10-CM | POA: Diagnosis not present

## 2015-05-17 DIAGNOSIS — Z96651 Presence of right artificial knee joint: Secondary | ICD-10-CM | POA: Diagnosis not present

## 2015-05-18 DIAGNOSIS — H04121 Dry eye syndrome of right lacrimal gland: Secondary | ICD-10-CM | POA: Diagnosis not present

## 2015-05-18 LAB — COMPREHENSIVE METABOLIC PANEL
ALK PHOS: 83 IU/L (ref 39–117)
ALT: 11 IU/L (ref 0–32)
AST: 17 IU/L (ref 0–40)
Albumin/Globulin Ratio: 1.7 (ref 1.1–2.5)
Albumin: 4 g/dL (ref 3.5–4.8)
BILIRUBIN TOTAL: 0.3 mg/dL (ref 0.0–1.2)
BUN/Creatinine Ratio: 13 (ref 11–26)
BUN: 7 mg/dL — AB (ref 8–27)
CHLORIDE: 99 mmol/L (ref 97–106)
CO2: 26 mmol/L (ref 18–29)
Calcium: 9.4 mg/dL (ref 8.7–10.3)
Creatinine, Ser: 0.55 mg/dL — ABNORMAL LOW (ref 0.57–1.00)
GFR calc Af Amer: 108 mL/min/{1.73_m2} (ref >59)
GFR calc non Af Amer: 94 mL/min/{1.73_m2} (ref >59)
GLUCOSE: 78 mg/dL (ref 65–99)
Globulin, Total: 2.4 g/dL (ref 1.5–4.5)
Potassium: 4.3 mmol/L (ref 3.5–5.2)
Sodium: 143 mmol/L (ref 136–144)
Total Protein: 6.4 g/dL (ref 6.0–8.5)

## 2015-05-18 LAB — LIPID PANEL
CHOLESTEROL TOTAL: 126 mg/dL (ref 100–199)
Chol/HDL Ratio: 1.8 ratio units (ref 0.0–4.4)
HDL: 71 mg/dL (ref >39)
LDL Calculated: 44 mg/dL (ref 0–99)
TRIGLYCERIDES: 57 mg/dL (ref 0–149)
VLDL CHOLESTEROL CAL: 11 mg/dL (ref 5–40)

## 2015-05-18 LAB — TSH: TSH: 2.96 u[IU]/mL (ref 0.450–4.500)

## 2015-05-18 NOTE — Telephone Encounter (Signed)
Patient notified

## 2015-05-19 DIAGNOSIS — Z85828 Personal history of other malignant neoplasm of skin: Secondary | ICD-10-CM | POA: Diagnosis not present

## 2015-05-19 DIAGNOSIS — D2261 Melanocytic nevi of right upper limb, including shoulder: Secondary | ICD-10-CM | POA: Diagnosis not present

## 2015-05-19 DIAGNOSIS — X32XXXA Exposure to sunlight, initial encounter: Secondary | ICD-10-CM | POA: Diagnosis not present

## 2015-05-19 DIAGNOSIS — D225 Melanocytic nevi of trunk: Secondary | ICD-10-CM | POA: Diagnosis not present

## 2015-05-19 DIAGNOSIS — Z85820 Personal history of malignant melanoma of skin: Secondary | ICD-10-CM | POA: Diagnosis not present

## 2015-05-19 DIAGNOSIS — L570 Actinic keratosis: Secondary | ICD-10-CM | POA: Diagnosis not present

## 2015-05-20 ENCOUNTER — Encounter: Payer: Self-pay | Admitting: *Deleted

## 2015-05-20 ENCOUNTER — Other Ambulatory Visit: Payer: Medicare Other

## 2015-05-20 NOTE — Patient Instructions (Signed)
  Your procedure is scheduled on: 05-23-15 Report to [facility]. To find out your arrival time please call [phone] between 1PM - 3PM on 05-20-15  Remember: Instructions that are not followed completely may result in serious medical risk, up to and including death, or upon the discretion of your surgeon and anesthesiologist your surgery may need to be rescheduled.    _X___ 1. Do not eat food or drink liquids after midnight. No gum chewing or hard candies.     _X___ 2. No Alcohol for 24 hours before or after surgery.   ____ 3. Bring all medications with you on the day of surgery if instructed.    ____ 4. Notify your doctor if there is any change in your medical condition     (cold, fever, infections).     Do not wear jewelry, make-up, hairpins, clips or nail polish.  Do not wear lotions, powders, or perfumes. You may wear deodorant.  Do not shave 48 hours prior to surgery. Men may shave face and neck.  Do not bring valuables to the hospital.    [facility] is not responsible for any belongings or valuables.               Contacts, dentures or bridgework may not be worn into surgery.  Leave your suitcase in the car. After surgery it may be brought to your room.  For patients admitted to the hospital, discharge time is determined by your treatment team.   Patients discharged the day of surgery will not be allowed to drive home.   Please read over the following fact sheets that you were given:      _X___ Take these medicines the morning of surgery with A SIP OF WATER:    1. FLECAINIDE  2. METOPROLOL  3. OMEPRAZOLE  4. TAKE AN EXTRA OMEPRAZOLE ON Sunday NIGHT  5.  6.  ____ Fleet Enema (as directed)   ____ Use CHG Soap as directed  ____ Use inhalers on the day of surgery  _X__ Stop metformin 2 days prior to surgery-LAST DOSE Friday NIGHT (05-20-15)   _X___ Take 1/2 of usual insulin dose the night before surgery and none on the morning of  surgery-TAKE 13 UNITS OF LANTUS Sunday NIGHT AND NONE AM OF SURGERY  ____ Stop Coumadin/Plavix/aspirin-PT STOPPED ASA ON 05-18-15  ____ Stop Anti-inflammatories-NO NSAIDS OR ASA PRODUCTS-HYDROCODONE OK TO CONTINUE   _X___ Stop supplements until after surgery-STOP B COMPLEX, CO Q 10, FISH OIL, GARLIC, GLUCOSAMINE, LECITHIN,PROBIOTIC NOW  ____ Bring C-Pap to the hospital.

## 2015-05-23 ENCOUNTER — Ambulatory Visit: Payer: Medicare Other | Admitting: Certified Registered Nurse Anesthetist

## 2015-05-23 ENCOUNTER — Ambulatory Visit
Admission: RE | Admit: 2015-05-23 | Discharge: 2015-05-23 | Disposition: A | Discharge status: Home / Self Care | Payer: Medicare Other | Source: Ambulatory Visit | Attending: Orthopedic Surgery | Admitting: Orthopedic Surgery

## 2015-05-23 ENCOUNTER — Ambulatory Visit: Payer: Medicare Other

## 2015-05-23 ENCOUNTER — Encounter
Admission: RE | Disposition: A | Discharge status: Home / Self Care | Payer: Self-pay | Source: Ambulatory Visit | Attending: Orthopedic Surgery

## 2015-05-23 DIAGNOSIS — E669 Obesity, unspecified: Secondary | ICD-10-CM | POA: Diagnosis not present

## 2015-05-23 DIAGNOSIS — Z6833 Body mass index (BMI) 33.0-33.9, adult: Secondary | ICD-10-CM | POA: Diagnosis not present

## 2015-05-23 DIAGNOSIS — Z833 Family history of diabetes mellitus: Secondary | ICD-10-CM | POA: Diagnosis not present

## 2015-05-23 DIAGNOSIS — M810 Age-related osteoporosis without current pathological fracture: Secondary | ICD-10-CM | POA: Insufficient documentation

## 2015-05-23 DIAGNOSIS — Z79899 Other long term (current) drug therapy: Secondary | ICD-10-CM | POA: Diagnosis not present

## 2015-05-23 DIAGNOSIS — M24661 Ankylosis, right knee: Secondary | ICD-10-CM | POA: Diagnosis not present

## 2015-05-23 DIAGNOSIS — Z87442 Personal history of urinary calculi: Secondary | ICD-10-CM | POA: Diagnosis not present

## 2015-05-23 DIAGNOSIS — Z9889 Other specified postprocedural states: Secondary | ICD-10-CM

## 2015-05-23 DIAGNOSIS — Z8262 Family history of osteoporosis: Secondary | ICD-10-CM | POA: Insufficient documentation

## 2015-05-23 DIAGNOSIS — Z8249 Family history of ischemic heart disease and other diseases of the circulatory system: Secondary | ICD-10-CM | POA: Diagnosis not present

## 2015-05-23 DIAGNOSIS — Z96651 Presence of right artificial knee joint: Secondary | ICD-10-CM | POA: Diagnosis not present

## 2015-05-23 DIAGNOSIS — Z7982 Long term (current) use of aspirin: Secondary | ICD-10-CM | POA: Insufficient documentation

## 2015-05-23 DIAGNOSIS — I10 Essential (primary) hypertension: Secondary | ICD-10-CM | POA: Diagnosis not present

## 2015-05-23 DIAGNOSIS — K219 Gastro-esophageal reflux disease without esophagitis: Secondary | ICD-10-CM | POA: Diagnosis not present

## 2015-05-23 DIAGNOSIS — Z794 Long term (current) use of insulin: Secondary | ICD-10-CM | POA: Diagnosis not present

## 2015-05-23 DIAGNOSIS — T8489XA Other specified complication of internal orthopedic prosthetic devices, implants and grafts, initial encounter: Secondary | ICD-10-CM | POA: Insufficient documentation

## 2015-05-23 DIAGNOSIS — Z808 Family history of malignant neoplasm of other organs or systems: Secondary | ICD-10-CM | POA: Insufficient documentation

## 2015-05-23 DIAGNOSIS — E119 Type 2 diabetes mellitus without complications: Secondary | ICD-10-CM | POA: Insufficient documentation

## 2015-05-23 HISTORY — PX: KNEE CLOSED REDUCTION: SHX995

## 2015-05-23 LAB — GLUCOSE, CAPILLARY
GLUCOSE-CAPILLARY: 150 mg/dL — AB (ref 65–99)
Glucose-Capillary: 171 mg/dL — ABNORMAL HIGH (ref 65–99)

## 2015-05-23 SURGERY — MANIPULATION, KNEE, CLOSED
Anesthesia: Spinal | Site: Knee | Laterality: Right | Wound class: Clean

## 2015-05-23 MED ORDER — PROPOFOL 500 MG/50ML IV EMUL
INTRAVENOUS | Status: DC | PRN
  Administered 2015-05-23: 80 ug/kg/min via INTRAVENOUS

## 2015-05-23 MED ORDER — ONDANSETRON HCL 4 MG/2ML IJ SOLN: 4.0000 mg | Freq: Four times a day (QID) | INTRAMUSCULAR | Status: DC | PRN

## 2015-05-23 MED ORDER — HYDROCODONE-ACETAMINOPHEN 10-325 MG PO TABS
1.0000 | ORAL_TABLET | Freq: Four times a day (QID) | ORAL | Status: DC | PRN
Start: 2015-05-23 — End: 2015-08-18

## 2015-05-23 MED ORDER — ACETAMINOPHEN 10 MG/ML IV SOLN
INTRAVENOUS | Status: AC
  Filled 2015-05-23: qty 100

## 2015-05-23 MED ORDER — METOCLOPRAMIDE HCL 10 MG PO TABS: 5.0000 mg | ORAL_TABLET | Freq: Three times a day (TID) | ORAL | Status: DC | PRN

## 2015-05-23 MED ORDER — FENTANYL CITRATE (PF) 100 MCG/2ML IJ SOLN
INTRAMUSCULAR | Status: DC | PRN
  Administered 2015-05-23: 50 ug via INTRAVENOUS

## 2015-05-23 MED ORDER — SODIUM CHLORIDE 0.9 % IV SOLN: INTRAVENOUS | Status: DC

## 2015-05-23 MED ORDER — PROPOFOL 10 MG/ML IV BOLUS
INTRAVENOUS | Status: DC | PRN
  Administered 2015-05-23 (×2): 25 mg via INTRAVENOUS

## 2015-05-23 MED ORDER — ACETAMINOPHEN 10 MG/ML IV SOLN
INTRAVENOUS | Status: DC | PRN
  Administered 2015-05-23: 1000 mg via INTRAVENOUS

## 2015-05-23 MED ORDER — HYDROCODONE-ACETAMINOPHEN 10-325 MG PO TABS: 1.0000 | ORAL_TABLET | Freq: Four times a day (QID) | ORAL | Status: DC | PRN

## 2015-05-23 MED ORDER — ONDANSETRON HCL 4 MG PO TABS: 4.0000 mg | ORAL_TABLET | Freq: Four times a day (QID) | ORAL | Status: DC | PRN

## 2015-05-23 MED ORDER — ONDANSETRON HCL 4 MG/2ML IJ SOLN: 4.0000 mg | Freq: Once | INTRAMUSCULAR | Status: DC | PRN

## 2015-05-23 MED ORDER — MELOXICAM 7.5 MG PO TABS: 7.5000 mg | ORAL_TABLET | Freq: Two times a day (BID) | ORAL | Status: DC

## 2015-05-23 MED ORDER — BUPIVACAINE HCL (PF) 0.75 % IJ SOLN
INTRAMUSCULAR | Status: DC | PRN
  Administered 2015-05-23: 8 mg

## 2015-05-23 MED ORDER — FENTANYL CITRATE (PF) 100 MCG/2ML IJ SOLN: 25.0000 ug | INTRAMUSCULAR | Status: DC | PRN

## 2015-05-23 MED ORDER — METOCLOPRAMIDE HCL 5 MG/ML IJ SOLN: 5.0000 mg | Freq: Three times a day (TID) | INTRAMUSCULAR | Status: DC | PRN

## 2015-05-23 SURGICAL SUPPLY — 1 items: KIT RM TURNOVER STRD PROC AR (KITS) ×2 IMPLANT

## 2015-05-23 NOTE — Transfer of Care (Signed)
Immediate Anesthesia Transfer of Care Note  Patient: Bonnie Rowland  Procedure(s) Performed: Procedure(s): CLOSED MANIPULATION KNEE (Right)  Patient Location: PACU  Anesthesia Type:Spinal  Level of Consciousness: awake, alert  and oriented  Airway & Oxygen Therapy: Patient Spontanous Breathing and Patient connected to nasal cannula oxygen  Post-op Assessment: Report given to RN and Post -op Vital signs reviewed and stable  Post vital signs: Reviewed and stable  Last Vitals:  Filed Vitals:   05/23/15 1436 05/23/15 1554  BP: 152/60   Pulse: 63   Temp: 37 C 36.5 C  Resp: 16     Complications: No apparent anesthesia complications

## 2015-05-23 NOTE — Brief Op Note (Signed)
05/23/2015  3:55 PM  PATIENT:  Leona Carry Cupp  74 y.o. 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   Delay start of Pharmacological VTE agent (>24hrs) due to surgical blood loss or risk of bleeding: not applicable

## 2015-05-23 NOTE — OR Nursing (Signed)
Dr Marry Guan in to see patient and evaluate right knee prior to surgery.

## 2015-05-23 NOTE — Op Note (Signed)
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   ASSISTANT: None  ANESTHESIA: spinal  ESTIMATED BLOOD LOSS: None  TOURNIQUET TIME: Not used   DRAINS: None  IMPLANTS UTILIZED: Not applicable  INDICATIONS FOR SURGERY: Bonnie Rowland is a 73 y.o. year old female who underwent right total knee arthroplasty approximately 6 weeks ago. Despite [facility] as well as outpatient physical therapy, the patient was not making progress with her knee range of motion. After discussion of the risks and benefits of surgical intervention, the patient expressed understanding of the risks benefits and agree with plans for manipulation of the right knee under anesthesia.   PROCEDURE IN DETAIL: The patient was brought into the operating room and, after adequate spinal anesthesia was achieved, a timeout was performed as per usual protocol. Measurements were obtained with a goniometer prior to manipulation with flexion of only 74 documented. The right knee was flexed and gentle pressure was applied to the anterior aspect of the tibia. There was audible lysis of adhesions. After multiple gentle manipulations of the knee in flexion, measurements were repeated. Knee flexion of 122 was achieved following manipulation.  The patient tolerated the procedure well. She was transported to the recovery room in stable condition.  James P. Holley Bouche M.D.

## 2015-05-23 NOTE — Anesthesia Preprocedure Evaluation (Signed)
Anesthesia Evaluation  Patient identified by MRN, date of birth, ID band Patient awake    Reviewed: Allergy & Precautions, NPO status , Patient's Chart, lab work & pertinent test results  Airway Mallampati: III       Dental no notable dental hx.    Pulmonary neg pulmonary ROS,    Pulmonary exam normal breath sounds clear to auscultation       Cardiovascular hypertension, Pt. on medications  Rhythm:Regular Rate:Normal     Neuro/Psych    GI/Hepatic GERD  ,  Endo/Other  diabetes, Well Controlled, Type 1, Insulin Dependent  Renal/GU      Musculoskeletal   Abdominal (+) + obese,   Peds  Hematology   Anesthesia Other Findings   Reproductive/Obstetrics                             Anesthesia Physical Anesthesia Plan  ASA: III  Anesthesia Plan: Spinal   Post-op Pain Management:    Induction: Intravenous  Airway Management Planned: Nasal Cannula  Additional Equipment:   Intra-op Plan:   Post-operative Plan:   Informed Consent: I have reviewed the patients History and Physical, chart, labs and discussed the procedure including the risks, benefits and alternatives for the proposed anesthesia with the patient or authorized representative who has indicated his/her understanding and acceptance.     Plan Discussed with: CRNA  Anesthesia Plan Comments:         Anesthesia Quick Evaluation

## 2015-05-23 NOTE — Discharge Instructions (Signed)

## 2015-05-23 NOTE — OR Nursing (Signed)
Patient up to recliner chair;  Able to ambulate with assist x 2 plus walker.  Will coninue to monitor.

## 2015-05-23 NOTE — Brief Op Note (Deleted)
05/23/2015  4:49 PM  PATIENT:  Bonnie Rowland  74 y.o. 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   Delay start of Pharmacological VTE agent (>24hrs) due to surgical blood loss or risk of bleeding: not applicable

## 2015-05-23 NOTE — H&P (Signed)
The patient has been re-examined, and the chart reviewed, and there have been no interval changes to the documented history and physical.    The risks, benefits, and alternatives have been discussed at length. The patient expressed understanding of the risks benefits and agreed with plans for surgical intervention.  James P. Hooten, Jr. M.D.    

## 2015-05-24 DIAGNOSIS — Z96651 Presence of right artificial knee joint: Secondary | ICD-10-CM | POA: Diagnosis not present

## 2015-05-25 DIAGNOSIS — Z96651 Presence of right artificial knee joint: Secondary | ICD-10-CM | POA: Diagnosis not present

## 2015-05-27 NOTE — Anesthesia Postprocedure Evaluation (Signed)
Anesthesia Post Note  Patient: Bonnie Rowland  Procedure(s) Performed: Procedure(s) (LRB): CLOSED MANIPULATION KNEE (Right)  Anesthesia Type: Spinal Level of consciousness: awake and alert Pain management: pain level controlled Vital Signs Assessment: post-procedure vital signs reviewed and stable Respiratory status: respiratory function stable Cardiovascular status: stable Postop Assessment: No headache Anesthetic complications: no    Last Vitals:  Filed Vitals:   05/23/15 1702 05/23/15 1749  BP: 150/62 158/50  Pulse: 66 65  Temp: 37.3 C 36.3 C  Resp: 18 18    Last Pain:  Filed Vitals:   05/24/15 0814  PainSc: 5         RLE Motor Response: Purposeful movement RLE Sensation: No sensation (absent)      VAN STAVEREN,Rayman Petrosian

## 2015-05-30 ENCOUNTER — Encounter: Payer: Self-pay | Admitting: Orthopedic Surgery

## 2015-05-30 DIAGNOSIS — Z96651 Presence of right artificial knee joint: Secondary | ICD-10-CM | POA: Diagnosis not present

## 2015-06-01 DIAGNOSIS — Z96651 Presence of right artificial knee joint: Secondary | ICD-10-CM | POA: Diagnosis not present

## 2015-06-02 DIAGNOSIS — M5033 Other cervical disc degeneration, cervicothoracic region: Secondary | ICD-10-CM | POA: Diagnosis not present

## 2015-06-02 DIAGNOSIS — M9903 Segmental and somatic dysfunction of lumbar region: Secondary | ICD-10-CM | POA: Diagnosis not present

## 2015-06-02 DIAGNOSIS — M9901 Segmental and somatic dysfunction of cervical region: Secondary | ICD-10-CM | POA: Diagnosis not present

## 2015-06-02 DIAGNOSIS — M5416 Radiculopathy, lumbar region: Secondary | ICD-10-CM | POA: Diagnosis not present

## 2015-06-03 DIAGNOSIS — Z96651 Presence of right artificial knee joint: Secondary | ICD-10-CM | POA: Diagnosis not present

## 2015-06-06 DIAGNOSIS — Z96651 Presence of right artificial knee joint: Secondary | ICD-10-CM | POA: Diagnosis not present

## 2015-06-08 DIAGNOSIS — Z96651 Presence of right artificial knee joint: Secondary | ICD-10-CM | POA: Diagnosis not present

## 2015-06-09 DIAGNOSIS — M5416 Radiculopathy, lumbar region: Secondary | ICD-10-CM | POA: Diagnosis not present

## 2015-06-09 DIAGNOSIS — M9901 Segmental and somatic dysfunction of cervical region: Secondary | ICD-10-CM | POA: Diagnosis not present

## 2015-06-09 DIAGNOSIS — M9903 Segmental and somatic dysfunction of lumbar region: Secondary | ICD-10-CM | POA: Diagnosis not present

## 2015-06-09 DIAGNOSIS — M5033 Other cervical disc degeneration, cervicothoracic region: Secondary | ICD-10-CM | POA: Diagnosis not present

## 2015-06-10 DIAGNOSIS — Z96651 Presence of right artificial knee joint: Secondary | ICD-10-CM | POA: Diagnosis not present

## 2015-06-13 DIAGNOSIS — Z96651 Presence of right artificial knee joint: Secondary | ICD-10-CM | POA: Diagnosis not present

## 2015-06-17 DIAGNOSIS — Z96651 Presence of right artificial knee joint: Secondary | ICD-10-CM | POA: Diagnosis not present

## 2015-06-21 DIAGNOSIS — Z96651 Presence of right artificial knee joint: Secondary | ICD-10-CM | POA: Diagnosis not present

## 2015-06-22 DIAGNOSIS — Z96651 Presence of right artificial knee joint: Secondary | ICD-10-CM | POA: Diagnosis not present

## 2015-06-23 DIAGNOSIS — M9903 Segmental and somatic dysfunction of lumbar region: Secondary | ICD-10-CM | POA: Diagnosis not present

## 2015-06-23 DIAGNOSIS — M5033 Other cervical disc degeneration, cervicothoracic region: Secondary | ICD-10-CM | POA: Diagnosis not present

## 2015-06-23 DIAGNOSIS — M9901 Segmental and somatic dysfunction of cervical region: Secondary | ICD-10-CM | POA: Diagnosis not present

## 2015-06-23 DIAGNOSIS — M5416 Radiculopathy, lumbar region: Secondary | ICD-10-CM | POA: Diagnosis not present

## 2015-06-24 DIAGNOSIS — Z96651 Presence of right artificial knee joint: Secondary | ICD-10-CM | POA: Diagnosis not present

## 2015-06-28 ENCOUNTER — Ambulatory Visit: Payer: BLUE CROSS/BLUE SHIELD | Admitting: Obstetrics and Gynecology

## 2015-06-28 DIAGNOSIS — Z96651 Presence of right artificial knee joint: Secondary | ICD-10-CM | POA: Diagnosis not present

## 2015-06-29 DIAGNOSIS — Z96651 Presence of right artificial knee joint: Secondary | ICD-10-CM | POA: Diagnosis not present

## 2015-06-30 ENCOUNTER — Ambulatory Visit (INDEPENDENT_AMBULATORY_CARE_PROVIDER_SITE_OTHER): Payer: Medicare Other | Admitting: Obstetrics and Gynecology

## 2015-06-30 ENCOUNTER — Encounter: Payer: Self-pay | Admitting: Obstetrics and Gynecology

## 2015-06-30 VITALS — BP 150/84 | HR 64 | Resp 16 | Ht 64.0 in | Wt 190.8 lb

## 2015-06-30 DIAGNOSIS — N952 Postmenopausal atrophic vaginitis: Secondary | ICD-10-CM

## 2015-06-30 DIAGNOSIS — R300 Dysuria: Secondary | ICD-10-CM | POA: Diagnosis not present

## 2015-06-30 DIAGNOSIS — M5033 Other cervical disc degeneration, cervicothoracic region: Secondary | ICD-10-CM | POA: Diagnosis not present

## 2015-06-30 DIAGNOSIS — M5416 Radiculopathy, lumbar region: Secondary | ICD-10-CM | POA: Diagnosis not present

## 2015-06-30 DIAGNOSIS — M9901 Segmental and somatic dysfunction of cervical region: Secondary | ICD-10-CM | POA: Diagnosis not present

## 2015-06-30 DIAGNOSIS — M9903 Segmental and somatic dysfunction of lumbar region: Secondary | ICD-10-CM | POA: Diagnosis not present

## 2015-06-30 LAB — URINALYSIS, COMPLETE
BILIRUBIN UA: NEGATIVE
GLUCOSE, UA: NEGATIVE
NITRITE UA: NEGATIVE
RBC UA: NEGATIVE
Specific Gravity, UA: 1.015 (ref 1.005–1.030)
UUROB: 0.2 mg/dL (ref 0.2–1.0)
pH, UA: 8.5 — ABNORMAL HIGH (ref 5.0–7.5)

## 2015-06-30 LAB — MICROSCOPIC EXAMINATION
Epithelial Cells (non renal): >10 /hpf — ABNORMAL HIGH (ref 0–10)
RBC, UA: NONE SEEN /hpf (ref 0–?)

## 2015-06-30 MED ORDER — NYSTATIN 100000 UNIT/GM EX CREA: 1.0000 "application " | TOPICAL_CREAM | Freq: Two times a day (BID) | CUTANEOUS | Status: DC

## 2015-06-30 MED ORDER — ESTRADIOL 0.1 MG/GM VA CREA: TOPICAL_CREAM | VAGINAL | Status: DC

## 2015-06-30 NOTE — Progress Notes (Signed)
06/30/2015 10:24 AM   Bonnie Rowland 21-Sep-1940 [id number]  Referring provider: Ashok Norris, MD [address] [place], [place] [address]  Chief Complaint  Patient presents with  . Follow-up  . Dysuria    HPI: Patient is a 74 year old female presenting today for her annual follow-up visit for dysuria and vaginal atrophy. She currently denies any vaginal itching or burning. She states that she did experience some itching in her groin area over the holidays when she was eating more sugar and felt that her glucose levels may have been slightly elevated. She has been using her vaginal estrogen cream as indicated dictated and nystatin cream as needed. She reports that she has been doing very well and her dysuria has not recurred.  She is requesting refills on both medications today.   PMH: Past Medical History  Diagnosis Date  . Hyperlipidemia   . Hypertension   . Sprain of lumbar region   . Thoracic or lumbosacral neuritis or radiculitis, unspecified   . Allergic rhinitis, cause unspecified   . Diabetes mellitus     type II  . 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 Dermatophytosis of nail   . Esophageal reflux   . Cellulitis and abscess of leg, except foot   . Urinary tract infection, site not specified   . Acute cystitis   . Conjunctivitis unspecified   . Bronchitis, not specified as acute or chronic   . 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 Renal artery stenosis ([place])   . 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 Appendectomy    . Colonoscopy    . Diagnostic mammogram    . Cataract surgery    . Tonsillectomy    . Eye surgery Bilateral     Cataract Extraction  . Knee arthroscopy Right   . Cardiac catheterization    . Knee arthroplasty Right 03/30/2015    Procedure: COMPUTER ASSISTED TOTAL  KNEE ARTHROPLASTY;  Surgeon: Dereck Leep, MD;  Location: [facility] ORS;  Service: Orthopedics;  Laterality: Right;  . Knee closed reduction Right 05/23/2015    Procedure: CLOSED MANIPULATION KNEE;  Surgeon: Dereck Leep, MD;  Location: [facility] ORS;  Service: Orthopedics;  Laterality: Right;    Home Medications:    Medication List       This list is accurate as of: 06/30/15 10:24 AM.  Always use your most recent med list.               ALFALFA PO  Take 3 tablets by mouth every morning.     amoxicillin-clavulanate 875-125 MG tablet  Commonly known as:  AUGMENTIN  Take 1 tablet by mouth. Reported on 06/30/2015     aspirin 81 MG EC tablet  Take 81 mg by mouth at bedtime.     B-complex with vitamin C tablet  Take 2 tablets by mouth at bedtime.     calcium carbonate 600 MG Tabs tablet  Commonly known as:  OS-CAL  Take 600 mg by mouth daily.     cholecalciferol 1000 units tablet  Commonly known as:  VITAMIN D  Take 4,000 Units by mouth daily.     clobetasol ointment 0.05 %  Commonly known as:  TEMOVATE  Apply 1 application topically 2 (two) times daily as needed (bug  bites). Reported on 06/30/2015     CoQ10 200 MG Caps  Take 1 tablet by mouth daily. 400 mg every am.     DOCOSAHEXAENOIC ACID PO  Take by mouth.     estradiol 0.1 MG/GM vaginal cream  Commonly known as:  ESTRACE VAGINAL  1/2 gm once weekly using applicator, apply blueberry sized amount of cream using tip of finger to urethra twice weekly     fish oil-omega-3 fatty acids 1000 MG capsule  Take 2 g by mouth daily.     flecainide 50 MG tablet  Commonly known as:  TAMBOCOR  Take 1 tablet (50 mg total) by mouth 2 (two) times daily.     fluconazole 150 MG tablet  Commonly known as:  DIFLUCAN  Take 150 mg by mouth as needed. Reported on 99991111     GARLIC 99991111 PO  Take 1 tablet by mouth daily. 1 gram     Glucosamine 500 MG Tabs  Take 1 tablet by mouth daily.     glucose blood test strip  Commonly  known as:  ONE TOUCH ULTRA TEST  Use as instructed     HYDROcodone-acetaminophen 10-325 MG tablet  Commonly known as:  NORCO  Take 1 tablet by mouth every 6 (six) hours as needed for moderate pain.     Insulin Glargine 100 UNIT/ML Solostar Pen  Commonly known as:  LANTUS SOLOSTAR  Inject 28 Units into the skin at bedtime.     Insulin Pen Needle 31G X 5 MM Misc  1 pen by Does not apply route 2 (two) times daily.     LECITHIN PO  Take 1 capsule by mouth every other day.     Liraglutide 18 MG/3ML Sopn  Commonly known as:  VICTOZA  Inject 0.2 mLs (1.2 mg total) into the skin once.     losartan-hydrochlorothiazide 100-25 MG tablet  Commonly known as:  HYZAAR  Take 1 tablet by mouth daily.     MAGNESIUM-POTASSIUM PO  Take 1 tablet by mouth 2 (two) times daily. Reported on 06/30/2015     meloxicam 7.5 MG tablet  Commonly known as:  MOBIC  Take 1 tablet (7.5 mg total) by mouth 2 (two) times daily.     metformin 500 MG (OSM) 24 hr tablet  Commonly known as:  FORTAMET  Take 2 tablets (1,000 mg total) by mouth 2 (two) times daily with a meal.     metoprolol succinate 25 MG 24 hr tablet  Commonly known as:  TOPROL XL  Take 1 tablet (25 mg total) by mouth daily.     nystatin cream  Commonly known as:  MYCOSTATIN  Apply 1 application topically 2 (two) times daily.     omeprazole 20 MG capsule  Commonly known as:  PRILOSEC  Take 20 mg capsule every other day     onetouch ultrasoft lancets  Use as instructed     PRESERVISION AREDS 2 Caps  Take 1 capsule by mouth 2 (two) times daily.     PROBIOTIC PO  Take 1 Dose by mouth at bedtime.     raloxifene 60 MG tablet  Commonly known as:  EVISTA  Take 1 tablet (60 mg total) by mouth daily.     rosuvastatin 10 MG tablet  Commonly known as:  CRESTOR  Take 1 tablet (10 mg total) by mouth daily.     sulfacetamide 10 % ophthalmic solution  Commonly known as:  BLEPH-10  Place 1 drop into both eyes 3 (three) times  daily as needed.      SYSTANE BALANCE OP  Apply 1 drop to eye as needed.     traMADol 50 MG tablet  Commonly known as:  ULTRAM  Take 50 mg by mouth every 6 (six) hours as needed. Reported on 06/30/2015     vitamin C 500 MG tablet  Commonly known as:  ASCORBIC ACID  Take 500 mg by mouth 2 (two) times daily.     vitamin E 400 UNIT capsule  Generic drug:  vitamin E  Take 400 Units by mouth daily. Every am        Allergies:  Allergies  Allergen Reactions  . Cyclobenzaprine Hypertension  . Cheese Other (See Comments)    bloating  . Ciprofloxacin Hcl Other (See Comments)    Muscle pain    Family History: Family History  Problem Relation Age of Onset  . Diabetes Mother   . Hypertension Mother   . Cancer Mother   . Hypertension Father   . Cancer Father     Social History:  reports that she has never smoked. She has never used smokeless tobacco. She reports that she does not drink alcohol or use illicit drugs.  ROS: UROLOGY Frequent Urination?: No Hard to postpone urination?: No Burning/pain with urination?: No Get up at night to urinate?: Yes Leakage of urine?: No Urine stream starts and stops?: No Trouble starting stream?: No Do you have to strain to urinate?: No Blood in urine?: No Urinary tract infection?: No Sexually transmitted disease?: No Injury to kidneys or bladder?: No Painful intercourse?: No Weak stream?: No Currently pregnant?: No Vaginal bleeding?: No Last menstrual period?: n  Gastrointestinal Nausea?: No Vomiting?: No Indigestion/heartburn?: No Diarrhea?: No Constipation?: No  Constitutional Fever: No Night sweats?: No Weight loss?: No Fatigue?: No  Skin Skin rash/lesions?: No Itching?: No  Eyes Blurred vision?: No Double vision?: No  Ears/Nose/Throat Sore throat?: No Sinus problems?: No  Hematologic/Lymphatic Swollen glands?: No Easy bruising?: No  Cardiovascular Leg swelling?: No Chest pain?: No  Respiratory Cough?: No Shortness  of breath?: No  Endocrine Excessive thirst?: No  Musculoskeletal Back pain?: No Joint pain?: No  Neurological Headaches?: No Dizziness?: No  Psychologic Depression?: No Anxiety?: No  Physical Exam: BP 150/84 mmHg  Pulse 64  Resp 16  Ht 5\' 4"  (1.626 m)  Wt 190 lb 12.8 oz (86.546 kg)  BMI 32.73 kg/m2  Constitutional:  Alert and oriented, No acute distress. HEENT: [place] AT, moist mucus membranes.  Trachea midline, no masses. Cardiovascular: No clubbing, cyanosis, or edema. Respiratory: Normal respiratory effort, no increased work of breathing. GI: Abdomen is soft, nontender, nondistended, no abdominal masses GU: No CVA tenderness. Pelvic: external exam demonstrating mild vaginal atrophy, no lesions or abnormal discharge Skin: No rashes, bruises or suspicious lesions. Lymph: No cervical or inguinal adenopathy. Neurologic: Grossly intact, no focal deficits, moving all 4 extremities. Psychiatric: Normal mood and affect.  Laboratory Data:   Urinalysis Results for orders placed or performed in visit on 06/30/15  Microscopic Examination  Result Value Ref Range   WBC, UA >30 (H) 0 -  5 /hpf   RBC, UA None seen 0 -  2 /hpf   Epithelial Cells (non renal) >10 (H) 0 - 10 /hpf   Renal Epithel, UA 0-10 (A) None seen /hpf   Bacteria, UA Many (A) None seen/Few  Urinalysis, Complete  Result Value Ref Range   Specific Gravity, UA 1.015 1.005 - 1.030   pH, UA 8.5 (H) 5.0 - 7.5  Color, UA Yellow Yellow   Appearance Ur Cloudy (A) Clear   Leukocytes, UA 2+ (A) Negative   Protein, UA 1+ (A) Negative/Trace   Glucose, UA Negative Negative   Ketones, UA Trace (A) Negative   RBC, UA Negative Negative   Bilirubin, UA Negative Negative   Urobilinogen, Ur 0.2 0.2 - 1.0 mg/dL   Nitrite, UA Negative Negative   Microscopic Examination See below:     Pertinent Imaging:   Assessment & Plan:    1. Dysuria-  Resolved. Continue UTI prevention strategies. UA with greater than 30 WBCs, 0  RBCs and many bacteria. It is nitrite negative. Patient denies any urinary symptoms especially this is likely vaginal contamination. We will send off a culture today and treat as needed. - Urinalysis, Complete -Urine Culture  2. Vaginal Atrophy- Patient will continue vaginal estrogen cream 1/2 g vaginally once weekly as well as blueberry sized amount of cream applied to urethra with finger twice weekly at bedtime. She denies any adverse reactions. No vaginal bleeding.    Return in about 1 year (around 06/29/2016), or if symptoms worsen or fail to improve, for recurrent UTI; vaginal atrophy.  These notes generated with voice recognition software. I apologize for typographical errors.  Herbert Moors, [facility] [address], [place], [place] [address] [phone]

## 2015-07-01 DIAGNOSIS — Z96651 Presence of right artificial knee joint: Secondary | ICD-10-CM | POA: Diagnosis not present

## 2015-07-02 LAB — CULTURE, URINE COMPREHENSIVE

## 2015-07-05 ENCOUNTER — Telehealth: Payer: Self-pay

## 2015-07-05 NOTE — Telephone Encounter (Signed)
LMOM- recent labs negative.

## 2015-07-05 NOTE — Telephone Encounter (Signed)
-----   Message from Roda Shutters, [place] sent at 07/05/2015 12:58 PM EST ----- Notify patient that her urine culture was negative for infection. Thanks

## 2015-07-07 DIAGNOSIS — M9903 Segmental and somatic dysfunction of lumbar region: Secondary | ICD-10-CM | POA: Diagnosis not present

## 2015-07-07 DIAGNOSIS — M5416 Radiculopathy, lumbar region: Secondary | ICD-10-CM | POA: Diagnosis not present

## 2015-07-07 DIAGNOSIS — M9901 Segmental and somatic dysfunction of cervical region: Secondary | ICD-10-CM | POA: Diagnosis not present

## 2015-07-07 DIAGNOSIS — M5033 Other cervical disc degeneration, cervicothoracic region: Secondary | ICD-10-CM | POA: Diagnosis not present

## 2015-07-14 DIAGNOSIS — M9903 Segmental and somatic dysfunction of lumbar region: Secondary | ICD-10-CM | POA: Diagnosis not present

## 2015-07-14 DIAGNOSIS — M9901 Segmental and somatic dysfunction of cervical region: Secondary | ICD-10-CM | POA: Diagnosis not present

## 2015-07-14 DIAGNOSIS — M5033 Other cervical disc degeneration, cervicothoracic region: Secondary | ICD-10-CM | POA: Diagnosis not present

## 2015-07-14 DIAGNOSIS — M5416 Radiculopathy, lumbar region: Secondary | ICD-10-CM | POA: Diagnosis not present

## 2015-07-19 ENCOUNTER — Ambulatory Visit (INDEPENDENT_AMBULATORY_CARE_PROVIDER_SITE_OTHER): Payer: Medicare Other | Admitting: Family Medicine

## 2015-07-19 ENCOUNTER — Encounter: Payer: Self-pay | Admitting: Family Medicine

## 2015-07-19 ENCOUNTER — Ambulatory Visit: Payer: Medicare Other | Admitting: Family Medicine

## 2015-07-19 VITALS — BP 140/86 | HR 69 | Temp 99.5°F | Resp 16 | Ht 64.0 in | Wt 190.5 lb

## 2015-07-19 DIAGNOSIS — J0100 Acute maxillary sinusitis, unspecified: Secondary | ICD-10-CM | POA: Diagnosis not present

## 2015-07-19 MED ORDER — AMOXICILLIN-POT CLAVULANATE 875-125 MG PO TABS: 1.0000 | ORAL_TABLET | Freq: Two times a day (BID) | ORAL | Status: DC

## 2015-07-19 NOTE — Patient Instructions (Signed)

## 2015-07-19 NOTE — Progress Notes (Signed)
Name: Bonnie Rowland   MRN: [id number]    DOB: Jun 25, 1941   Date:07/19/2015       Progress Note  Subjective  Chief Complaint  Chief Complaint  Patient presents with  . Sinusitis    for 8 days    HPI  Patient is here today with concerns regarding the following symptoms congestion, post nasal drip, ear pressure, sinus pressure, achiness and low grade fevers that started 8 days ago.  Associated with fatigue. Not associated with rash, foreign travel, change in bowel habits or urinary habits. Has tried the following home remedies: allergy medication, rest. Positive sick contact is her husband.    Past Medical History  Diagnosis Date  . Hyperlipidemia   . Hypertension   . Sprain of lumbar region   . Thoracic or lumbosacral neuritis or radiculitis, unspecified   . Allergic rhinitis, cause unspecified   . Diabetes mellitus     type II  . 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 Dermatophytosis of nail   . Esophageal reflux   . Cellulitis and abscess of leg, except foot   . Urinary tract infection, site not specified   . Acute cystitis   . Conjunctivitis unspecified   . Bronchitis, not specified as acute or chronic   . 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 Renal artery stenosis ([place])   . 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 Smoking status: Never Smoker   . Smokeless tobacco: Never Used  . Alcohol Use: No     Comment: occasional     Current outpatient prescriptions:  .  ALFALFA PO, Take 3 tablets by mouth every morning., Disp: , Rfl:  .  aspirin 81 MG EC tablet, Take 81 mg by mouth at bedtime. , Disp: , Rfl:  .  B Complex-C (B-COMPLEX WITH VITAMIN C) tablet, Take 2 tablets by mouth at bedtime. , Disp: , Rfl:  .  calcium carbonate (OS-CAL) 600 MG TABS tablet, Take 600 mg by mouth daily., Disp: , Rfl:  .  cholecalciferol (VITAMIN D) 1000  UNITS tablet, Take 4,000 Units by mouth daily. , Disp: , Rfl:  .  clobetasol ointment (TEMOVATE) [id number] %, Apply 1 application topically 2 (two) times daily as needed (bug bites). Reported on 06/30/2015, Disp: , Rfl:  .  Coenzyme Q10 (COQ10) 200 MG CAPS, Take 1 tablet by mouth daily. 400 mg every am., Disp: , Rfl:  .  DOCOSAHEXAENOIC ACID PO, Take by mouth., Disp: , Rfl:  .  estradiol (ESTRACE VAGINAL) 0.1 MG/GM vaginal cream, 1/2 gm once weekly using applicator, apply blueberry sized amount of cream using tip of finger to urethra twice weekly, Disp: 50 g, Rfl: 4 .  fish oil-omega-3 fatty acids 1000 MG capsule, Take 2 g by mouth daily.  , Disp: , Rfl:  .  flecainide (TAMBOCOR) 50 MG tablet, Take 1 tablet (50 mg total) by mouth 2 (two) times daily., Disp: 180 tablet, Rfl: 3 .  fluconazole (DIFLUCAN) 150 MG tablet, Take 150 mg by mouth as needed. Reported on 06/30/2015, Disp: , Rfl:  .  GARLIC 99991111 PO, Take 1 tablet by mouth daily. 1 gram, Disp: , Rfl:  .  Glucosamine 500 MG TABS, Take 1 tablet by mouth daily. , Disp: , Rfl:  .  glucose blood (ONE TOUCH ULTRA TEST)  test strip, Use as instructed, Disp: 50 each, Rfl: 12 .  HYDROcodone-acetaminophen (NORCO) 10-325 MG tablet, Take 1 tablet by mouth every 6 (six) hours as needed for moderate pain. (Patient not taking: Reported on 06/30/2015), Disp: 270 tablet, Rfl: 0 .  Insulin Glargine (LANTUS SOLOSTAR) 100 UNIT/ML Solostar Pen, Inject 28 Units into the skin at bedtime. (Patient taking differently: Inject 26 Units into the skin at bedtime. ), Disp: 15 pen, Rfl: PRN .  Insulin Pen Needle 31G X 5 MM MISC, 1 pen by Does not apply route 2 (two) times daily., Disp: 90 each, Rfl: 12 .  Lancets (ONETOUCH ULTRASOFT) lancets, Use as instructed, Disp: 100 each, Rfl: 12 .  LECITHIN PO, Take 1 capsule by mouth every other day., Disp: , Rfl:  .  Liraglutide (VICTOZA) 18 MG/3ML SOPN, Inject 0.2 mLs (1.2 mg total) into the skin once. (Patient taking differently: Inject 1.2  mg into the skin once. Every 8 am), Disp: 6 pen, Rfl: 1 .  losartan-hydrochlorothiazide (HYZAAR) 100-25 MG per tablet, Take 1 tablet by mouth daily. (Patient taking differently: Take 1 tablet by mouth daily. Every am), Disp: 90 tablet, Rfl: 1 .  MAGNESIUM-POTASSIUM PO, Take 1 tablet by mouth 2 (two) times daily. Reported on 06/30/2015, Disp: , Rfl:  .  meloxicam (MOBIC) 7.5 MG tablet, Take 1 tablet (7.5 mg total) by mouth 2 (two) times daily., Disp: 30 tablet, Rfl: 2 .  metformin (FORTAMET) 500 MG (OSM) 24 hr tablet, Take 2 tablets (1,000 mg total) by mouth 2 (two) times daily with a meal., Disp: 360 tablet, Rfl: 1 .  metoprolol succinate (TOPROL XL) 25 MG 24 hr tablet, Take 1 tablet (25 mg total) by mouth daily. (Patient taking differently: Take 25 mg by mouth every morning. ), Disp: 90 tablet, Rfl: 3 .  Multiple Vitamins-Minerals (PRESERVISION AREDS 2) CAPS, Take 1 capsule by mouth 2 (two) times daily., Disp: , Rfl:  .  nystatin cream (MYCOSTATIN), Apply 1 application topically 2 (two) times daily., Disp: 30 g, Rfl: 3 .  omeprazole (PRILOSEC) 20 MG capsule, Take 20 mg capsule every other day (Patient taking differently: 20 mg every morning. Take 20 mg capsule every other day), Disp: 90 capsule, Rfl: 1 .  Probiotic Product (PROBIOTIC PO), Take 1 Dose by mouth at bedtime., Disp: , Rfl:  .  Propylene Glycol (SYSTANE BALANCE OP), Apply 1 drop to eye as needed., Disp: , Rfl:  .  raloxifene (EVISTA) 60 MG tablet, Take 1 tablet (60 mg total) by mouth daily. (Patient taking differently: Take 60 mg by mouth every morning. ), Disp: 90 tablet, Rfl: 1 .  rosuvastatin (CRESTOR) 10 MG tablet, Take 1 tablet (10 mg total) by mouth daily. (Patient taking differently: Take 10 mg by mouth every other day. ), Disp: 90 tablet, Rfl: 1 .  sulfacetamide (BLEPH-10) 10 % ophthalmic solution, Place 1 drop into both eyes 3 (three) times daily as needed., Disp: , Rfl:  .  traMADol (ULTRAM) 50 MG tablet, Take 50 mg by mouth every  6 (six) hours as needed. Reported on 06/30/2015, Disp: , Rfl:  .  vitamin C (ASCORBIC ACID) 500 MG tablet, Take 500 mg by mouth 2 (two) times daily., Disp: , Rfl:  .  vitamin E (VITAMIN E) 400 UNIT capsule, Take 400 Units by mouth daily. Every am, Disp: , Rfl:   Allergies  Allergen Reactions  . Cyclobenzaprine Hypertension  . Cheese Other (See Comments)    bloating  . Ciprofloxacin Hcl Other (See Comments)  Muscle pain    ROS  Positive for fatigue, nasal congestion, sinus pressure, ear fullness as mentioned in HPI, otherwise all systems reviewed and are negative.  Objective  Filed Vitals:   07/19/15 1321  BP: 140/86  Pulse: 69  Temp: 99.5 F (37.5 C)  TempSrc: Oral  Resp: 16  Height: 5\' 4"  (1.626 m)  Weight: 190 lb 8 oz (86.41 kg)  SpO2: 93%   Body mass index is 32.68 kg/(m^2).   Physical Exam  Constitutional: Patient appears well-developed and well-nourished. In no acute distress but does appear to be fatigued from acute illness. HEENT:  - Head: Normocephalic and atraumatic. Tenderness over maxillary sinuses.  - Ears: RIGHT TM bulging with minimal clear exudate, LEFT TM bulging with minimal clear exudate.  - Nose: Nasal mucosa boggy and congested.  - Mouth/Throat: Oropharynx is moist with slight erythema of bilateral tonsils without hypertrophy or exudates. Post nasal drainage present.  - Eyes: Conjunctivae clear, EOM movements normal. PERRLA. No scleral icterus.  Neck: Normal range of motion. Neck supple. No JVD present. No thyromegaly present. No local lymphadenopathy. Cardiovascular: Regular rate, regular rhythm with no murmurs heard.  Pulmonary/Chest: Effort normal and breath sounds clear in all lung fields.  Psychiatric: Patient has a normal mood and affect. Behavior is normal in office today. Judgment and thought content normal in office today.   Assessment & Plan  1. Acute maxillary sinusitis, recurrence not specified Etiologies include initial allergic  rhinitis or viral infection progressing to superimposed bacterial infection. Instructed patient on increasing hydration, nasal saline spray, steam inhalation, NSAID if tolerated and not contraindicated. If not already doing so start taking daily anti-histamine and use a steroid nasal spray. If symptoms persist/worsen may consider antibiotic therapy.  - amoxicillin-clavulanate (AUGMENTIN) 875-125 MG tablet; Take 1 tablet by mouth 2 (two) times daily.  Dispense: 20 tablet; Refill: 0

## 2015-07-22 DIAGNOSIS — M9901 Segmental and somatic dysfunction of cervical region: Secondary | ICD-10-CM | POA: Diagnosis not present

## 2015-07-22 DIAGNOSIS — M5033 Other cervical disc degeneration, cervicothoracic region: Secondary | ICD-10-CM | POA: Diagnosis not present

## 2015-07-22 DIAGNOSIS — M5416 Radiculopathy, lumbar region: Secondary | ICD-10-CM | POA: Diagnosis not present

## 2015-07-22 DIAGNOSIS — M9903 Segmental and somatic dysfunction of lumbar region: Secondary | ICD-10-CM | POA: Diagnosis not present

## 2015-07-28 DIAGNOSIS — M9903 Segmental and somatic dysfunction of lumbar region: Secondary | ICD-10-CM | POA: Diagnosis not present

## 2015-07-28 DIAGNOSIS — M5033 Other cervical disc degeneration, cervicothoracic region: Secondary | ICD-10-CM | POA: Diagnosis not present

## 2015-07-28 DIAGNOSIS — M5416 Radiculopathy, lumbar region: Secondary | ICD-10-CM | POA: Diagnosis not present

## 2015-07-28 DIAGNOSIS — M9901 Segmental and somatic dysfunction of cervical region: Secondary | ICD-10-CM | POA: Diagnosis not present

## 2015-08-04 DIAGNOSIS — M5416 Radiculopathy, lumbar region: Secondary | ICD-10-CM | POA: Diagnosis not present

## 2015-08-04 DIAGNOSIS — M5033 Other cervical disc degeneration, cervicothoracic region: Secondary | ICD-10-CM | POA: Diagnosis not present

## 2015-08-04 DIAGNOSIS — M9901 Segmental and somatic dysfunction of cervical region: Secondary | ICD-10-CM | POA: Diagnosis not present

## 2015-08-04 DIAGNOSIS — M9903 Segmental and somatic dysfunction of lumbar region: Secondary | ICD-10-CM | POA: Diagnosis not present

## 2015-08-11 DIAGNOSIS — M5416 Radiculopathy, lumbar region: Secondary | ICD-10-CM | POA: Diagnosis not present

## 2015-08-11 DIAGNOSIS — M5033 Other cervical disc degeneration, cervicothoracic region: Secondary | ICD-10-CM | POA: Diagnosis not present

## 2015-08-11 DIAGNOSIS — M9901 Segmental and somatic dysfunction of cervical region: Secondary | ICD-10-CM | POA: Diagnosis not present

## 2015-08-11 DIAGNOSIS — M9903 Segmental and somatic dysfunction of lumbar region: Secondary | ICD-10-CM | POA: Diagnosis not present

## 2015-08-18 ENCOUNTER — Encounter: Payer: Self-pay | Admitting: Family Medicine

## 2015-08-18 ENCOUNTER — Ambulatory Visit (INDEPENDENT_AMBULATORY_CARE_PROVIDER_SITE_OTHER): Payer: Medicare Other | Admitting: Family Medicine

## 2015-08-18 VITALS — BP 130/60 | HR 70 | Temp 98.3°F | Resp 14 | Ht 64.0 in | Wt 192.1 lb

## 2015-08-18 DIAGNOSIS — J069 Acute upper respiratory infection, unspecified: Secondary | ICD-10-CM | POA: Diagnosis not present

## 2015-08-18 MED ORDER — HYDROCOD POLST-CPM POLST ER 10-8 MG/5ML PO SUER: 5.0000 mL | Freq: Two times a day (BID) | ORAL | Status: DC | PRN

## 2015-08-18 MED ORDER — FLUTICASONE PROPIONATE 50 MCG/ACT NA SUSP: 2.0000 | Freq: Every day | NASAL | Status: DC

## 2015-08-18 NOTE — Progress Notes (Signed)
Name: Bonnie Rowland   MRN: [id number]    DOB: [birth year]   Date:08/18/2015       Progress Note  Subjective  Chief Complaint  Chief Complaint  Patient presents with  . URI    HPI  URI: patient was treated less than 4 weeks ago with Augmentin for a possible bacterial sinusitis. She states she has the same symptoms and would like a refill of antibiotics. She states husband had same symptoms earlier this week. She states she started with rhinorrhea that is usually clear, nasal congestion, low grade fever two days ago, post-nasal drainage, cough and scratchy throat. Explained to her that is likely viral.   Patient Active Problem List   Diagnosis Date Noted  . 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 Atrial flutter ([place]) 02/22/2015  . Preoperative cardiovascular examination 02/22/2015  . Chronic pain 01/10/2015  . Diabetes mellitus type 2, uncontrolled ([place]) 01/10/2015  . Fatigue 01/10/2015  . 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 Adiposity 01/10/2015  . Neoplasm of back ([place]) 01/10/2015  . Generalized OA 01/10/2015  . Spinal stenosis, lumbar region, 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  . DDD (degenerative disc disease), lumbar 11/14/2014  . Neuropathy due to secondary diabetes ([place]) 11/14/2014  . Greater trochanteric bursitis of both hips 11/14/2014  . Piriformis syndrome 11/14/2014  . 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 Chest pain 11/06/2010  . Diabetes type 2, uncontrolled ([place]) 11/07/2009  . Hyperlipemia 11/07/2009  . HYPERTENSION, BENIGN 11/07/2009  . RENAL ATHEROSCLEROSIS 11/07/2009  . Allergic rhinitis 11/27/2007  . Lumbosacral neuritis 01/17/2007    Past Surgical History  Procedure Laterality Date  . Appendectomy    . Colonoscopy    . Diagnostic mammogram    . Cataract surgery    . Tonsillectomy    . Eye surgery Bilateral     Cataract Extraction  . Knee arthroscopy Right   . Cardiac catheterization    . Knee  arthroplasty Right 03/30/2015    Procedure: COMPUTER ASSISTED TOTAL KNEE ARTHROPLASTY;  Surgeon: Dereck Leep, MD;  Location: [facility] ORS;  Service: Orthopedics;  Laterality: Right;  . Knee closed reduction Right 05/23/2015    Procedure: CLOSED MANIPULATION KNEE;  Surgeon: Dereck Leep, MD;  Location: [facility] ORS;  Service: Orthopedics;  Laterality: Right;    Family History  Problem Relation Age of Onset  . Diabetes Mother   . Hypertension Mother   . Cancer Mother   . Hypertension Father   . Cancer Father     Social History   Social History  . Marital Status: Married    Spouse Name: N/A  . Number of Children: N/A  . Years of Education: N/A   Occupational History  . Not on file.   Social History Main Topics  . Smoking status: Never Smoker   . Smokeless tobacco: Never Used  . Alcohol Use: No     Comment: occasional  . Drug Use: No  . Sexual Activity: Not on file   Other Topics Concern  . Not on file   Social History Narrative     Current outpatient prescriptions:  .  ALFALFA PO, Take 3 tablets by mouth every morning., Disp: , Rfl:  .  aspirin 81 MG EC tablet, Take 81 mg by mouth at bedtime. , Disp: , Rfl:  .  B Complex-C (B-COMPLEX WITH VITAMIN C) tablet, Take 2 tablets by mouth at bedtime. , Disp: , Rfl:  .  calcium carbonate (OS-CAL)  600 MG TABS tablet, Take 600 mg by mouth daily., Disp: , Rfl:  .  chlorpheniramine-HYDROcodone (TUSSIONEX PENNKINETIC ER) 10-8 MG/5ML SUER, Take 5 mLs by mouth every 12 (twelve) hours as needed., Disp: 140 mL, Rfl: 0 .  cholecalciferol (VITAMIN D) 1000 UNITS tablet, Take 4,000 Units by mouth daily. , Disp: , Rfl:  .  clobetasol ointment (TEMOVATE) AB-123456789 %, Apply 1 application topically 2 (two) times daily as needed (bug bites). Reported on 06/30/2015, Disp: , Rfl:  .  Coenzyme Q10 (COQ10) 200 MG CAPS, Take 1 tablet by mouth daily. 400 mg every am., Disp: , Rfl:  .  DOCOSAHEXAENOIC ACID PO, Take by mouth., Disp: , Rfl:  .  estradiol (ESTRACE  VAGINAL) 0.1 MG/GM vaginal cream, 1/2 gm once weekly using applicator, apply blueberry sized amount of cream using tip of finger to urethra twice weekly, Disp: 50 g, Rfl: 4 .  fish oil-omega-3 fatty acids 1000 MG capsule, Take 2 g by mouth daily.  , Disp: , Rfl:  .  flecainide (TAMBOCOR) 50 MG tablet, Take 1 tablet (50 mg total) by mouth 2 (two) times daily., Disp: 180 tablet, Rfl: 3 .  fluconazole (DIFLUCAN) 150 MG tablet, Take 150 mg by mouth as needed. Reported on 06/30/2015, Disp: , Rfl:  .  fluticasone (FLONASE) 50 MCG/ACT nasal spray, Place 2 sprays into both nostrils daily., Disp: 16 g, Rfl: 0 .  GARLIC 99991111 PO, Take 1 tablet by mouth daily. 1 gram, Disp: , Rfl:  .  Glucosamine 500 MG TABS, Take 1 tablet by mouth daily. , Disp: , Rfl:  .  glucose blood (ONE TOUCH ULTRA TEST) test strip, Use as instructed, Disp: 50 each, Rfl: 12 .  Insulin Glargine (LANTUS SOLOSTAR) 100 UNIT/ML Solostar Pen, Inject 28 Units into the skin at bedtime. (Patient taking differently: Inject 26 Units into the skin at bedtime. ), Disp: 15 pen, Rfl: PRN .  Insulin Pen Needle 31G X 5 MM MISC, 1 pen by Does not apply route 2 (two) times daily., Disp: 90 each, Rfl: 12 .  Lancets (ONETOUCH ULTRASOFT) lancets, Use as instructed, Disp: 100 each, Rfl: 12 .  LECITHIN PO, Take 1 capsule by mouth every other day., Disp: , Rfl:  .  Liraglutide (VICTOZA) 18 MG/3ML SOPN, Inject 0.2 mLs (1.2 mg total) into the skin once. (Patient taking differently: Inject 1.2 mg into the skin once. Every 8 am), Disp: 6 pen, Rfl: 1 .  losartan-hydrochlorothiazide (HYZAAR) 100-25 MG per tablet, Take 1 tablet by mouth daily. (Patient taking differently: Take 1 tablet by mouth daily. Every am), Disp: 90 tablet, Rfl: 1 .  MAGNESIUM-POTASSIUM PO, Take 1 tablet by mouth 2 (two) times daily. Reported on 06/30/2015, Disp: , Rfl:  .  meloxicam (MOBIC) 7.5 MG tablet, Take 1 tablet (7.5 mg total) by mouth 2 (two) times daily. (Patient not taking: Reported on  08/18/2015), Disp: 30 tablet, Rfl: 2 .  metformin (FORTAMET) 500 MG (OSM) 24 hr tablet, Take 2 tablets (1,000 mg total) by mouth 2 (two) times daily with a meal., Disp: 360 tablet, Rfl: 1 .  metoprolol succinate (TOPROL XL) 25 MG 24 hr tablet, Take 1 tablet (25 mg total) by mouth daily. (Patient taking differently: Take 25 mg by mouth every morning. ), Disp: 90 tablet, Rfl: 3 .  Multiple Vitamins-Minerals (PRESERVISION AREDS 2) CAPS, Take 1 capsule by mouth 2 (two) times daily., Disp: , Rfl:  .  nystatin cream (MYCOSTATIN), Apply 1 application topically 2 (two) times daily., Disp: 30  g, Rfl: 3 .  omeprazole (PRILOSEC) 20 MG capsule, Take 20 mg capsule every other day (Patient taking differently: 20 mg every morning. Take 20 mg capsule every other day), Disp: 90 capsule, Rfl: 1 .  Probiotic Product (PROBIOTIC PO), Take 1 Dose by mouth at bedtime., Disp: , Rfl:  .  Propylene Glycol (SYSTANE BALANCE OP), Apply 1 drop to eye as needed., Disp: , Rfl:  .  raloxifene (EVISTA) 60 MG tablet, Take 1 tablet (60 mg total) by mouth daily. (Patient taking differently: Take 60 mg by mouth every morning. ), Disp: 90 tablet, Rfl: 1 .  rosuvastatin (CRESTOR) 10 MG tablet, Take 1 tablet (10 mg total) by mouth daily. (Patient taking differently: Take 10 mg by mouth every other day. ), Disp: 90 tablet, Rfl: 1 .  sulfacetamide (BLEPH-10) 10 % ophthalmic solution, Place 1 drop into both eyes 3 (three) times daily as needed., Disp: , Rfl:  .  traMADol (ULTRAM) 50 MG tablet, Take 50 mg by mouth every 6 (six) hours as needed. Reported on 08/18/2015, Disp: , Rfl:  .  vitamin C (ASCORBIC ACID) 500 MG tablet, Take 500 mg by mouth 2 (two) times daily., Disp: , Rfl:  .  vitamin E (VITAMIN E) 400 UNIT capsule, Take 400 Units by mouth daily. Every am, Disp: , Rfl:   Allergies  Allergen Reactions  . Cyclobenzaprine Hypertension  . Cheese Other (See Comments)    bloating  . Ciprofloxacin Hcl Other (See Comments)    Muscle pain      ROS  Ten systems reviewed and is negative except as mentioned in HPI   Objective  Filed Vitals:   08/18/15 0947  BP: 130/60  Pulse: 70  Temp: 98.3 F (36.8 C)  TempSrc: Oral  Resp: 14  Height: 5\' 4"  (1.626 m)  Weight: 192 lb 1.6 oz (87.136 kg)  SpO2: 94%    Body mass index is 32.96 kg/(m^2).  Physical Exam  Constitutional: Patient appears well-developed and well-nourished. Obese  No distress.  HEENT: head atraumatic, normocephalic, pupils equal and reactive to light, ears normal TM bilaterally, neck supple, throat within normal limits, boggy turbinates. Tender sinus during percussion of maxillary sinus bilaterally.  Cardiovascular: Normal rate, regular rhythm and normal heart sounds.  No murmur heard. Trace edema right lower extremity - from recent knee replacement surgery  Pulmonary/Chest: Effort normal and breath sounds normal. No respiratory distress. Abdominal: Soft.  There is no tenderness. Psychiatric: Patient has a normal mood and affect. behavior is normal. Judgment and thought content normal.   Recent Results (from the past 2160 hour(s))  Glucose, capillary     Status: Abnormal   Collection Time: 05/23/15  2:31 PM  Result Value Ref Range   Glucose-Capillary 171 (H) 65 - 99 mg/dL   Comment 1 Notify RN   Glucose, capillary     Status: Abnormal   Collection Time: 05/23/15  4:03 PM  Result Value Ref Range   Glucose-Capillary 150 (H) 65 - 99 mg/dL  Urinalysis, Complete     Status: Abnormal   Collection Time: 06/30/15  9:39 AM  Result Value Ref Range   Specific Gravity, UA 1.015 1.005 - 1.030   pH, UA 8.5 (H) 5.0 - 7.5   Color, UA Yellow Yellow   Appearance Ur Cloudy (A) Clear   Leukocytes, UA 2+ (A) Negative   Protein, UA 1+ (A) Negative/Trace   Glucose, UA Negative Negative   Ketones, UA Trace (A) Negative   RBC, UA Negative Negative  Bilirubin, UA Negative Negative   Urobilinogen, Ur 0.2 0.2 - 1.0 mg/dL   Nitrite, UA Negative Negative    Microscopic Examination See below:   Microscopic Examination     Status: Abnormal   Collection Time: 06/30/15  9:39 AM  Result Value Ref Range   WBC, UA >30 (H) 0 -  5 /hpf   RBC, UA None seen 0 -  2 /hpf   Epithelial Cells (non renal) >10 (H) 0 - 10 /hpf   Renal Epithel, UA 0-10 (A) None seen /hpf   Bacteria, UA Many (A) None seen/Few  CULTURE, URINE COMPREHENSIVE     Status: None   Collection Time: 06/30/15 10:20 AM  Result Value Ref Range   Urine Culture, Comprehensive Final report    Result 1 Comment     Comment: Mixed urogenital flora 10,000-25,000 colony forming units per mL      PHQ2/9: Depression screen [facility] 2/9 07/19/2015 01/13/2015 01/10/2015 11/15/2014  Decreased Interest 0 0 0 0  Down, Depressed, Hopeless 0 0 0 0  PHQ - 2 Score 0 0 0 0     Fall Risk: Fall Risk  07/19/2015 03/15/2015 01/13/2015 01/10/2015 12/15/2014  Falls in the past year? No No (No Data) No (No Data)     Assessment & Plan  1. Upper respiratory infection  Advised fluids, rest and avoid antibiotic use. Symptom control. Also discussed Mucinex DM - fluticasone (FLONASE) 50 MCG/ACT nasal spray; Place 2 sprays into both nostrils daily.  Dispense: 16 g; Refill: 0 - chlorpheniramine-HYDROcodone (TUSSIONEX PENNKINETIC ER) 10-8 MG/5ML SUER; Take 5 mLs by mouth every 12 (twelve) hours as needed.  Dispense: 140 mL; Refill: 0

## 2015-08-24 ENCOUNTER — Ambulatory Visit (INDEPENDENT_AMBULATORY_CARE_PROVIDER_SITE_OTHER): Payer: Medicare Other | Admitting: Cardiovascular Disease

## 2015-08-24 ENCOUNTER — Encounter: Payer: Self-pay | Admitting: Cardiovascular Disease

## 2015-08-24 VITALS — BP 118/60 | HR 55 | Ht 64.0 in | Wt 189.0 lb

## 2015-08-24 DIAGNOSIS — E1165 Type 2 diabetes mellitus with hyperglycemia: Secondary | ICD-10-CM

## 2015-08-24 DIAGNOSIS — E1159 Type 2 diabetes mellitus with other circulatory complications: Secondary | ICD-10-CM | POA: Diagnosis not present

## 2015-08-24 DIAGNOSIS — IMO0002 Reserved for concepts with insufficient information to code with codable children: Secondary | ICD-10-CM

## 2015-08-24 DIAGNOSIS — E785 Hyperlipidemia, unspecified: Secondary | ICD-10-CM

## 2015-08-24 DIAGNOSIS — R0989 Other specified symptoms and signs involving the circulatory and respiratory systems: Secondary | ICD-10-CM

## 2015-08-24 DIAGNOSIS — I4892 Unspecified atrial flutter: Secondary | ICD-10-CM | POA: Diagnosis not present

## 2015-08-24 DIAGNOSIS — I6529 Occlusion and stenosis of unspecified carotid artery: Secondary | ICD-10-CM | POA: Diagnosis not present

## 2015-08-24 DIAGNOSIS — I10 Essential (primary) hypertension: Secondary | ICD-10-CM

## 2015-08-24 DIAGNOSIS — Z794 Long term (current) use of insulin: Secondary | ICD-10-CM

## 2015-08-24 NOTE — Progress Notes (Signed)
Patient ID: Bonnie Rowland, female    DOB: 21-Jan-1941, 75 y.o.   MRN: [id number]  HPI Comments: Bonnie Rowland is a very pleasant 75 year old woman with a history of chest pain, normal coronary arteries by cardiac catheterization April 2007 ,  < 50%  renal artery disease,  hyperlipidemia, diabetes, hypertension, obesity who presents for evaluation of recent episode of atrial flutter, status post right total knee replacement surgery  On her last clinic visit she was maintaining normal sinus rhythm She has completed her physical therapy she underwent successful knee replacement surgery at the end of September 2016.  Spent several weeks at [facility] rehabilitation.   Currently feels well with no complaints, denies any tachycardia concerning for arrhythmia She reports blood pressures well controlled, she's having difficulty with her sugars, they are running high She takes baby aspirin  Very mild leg swelling, worse on the right than the left, wears a very mild compression hose  EKG on today's visit shows normal sinus rhythm with rate 55 bpm, no significant ST or T-wave changes,  APC  Other past medical history 02/21/15 she presented to [facility] for right total knee replacement by Dr. Marry Guan. She reported developing tachycardia that morning. She had extreme pain, was rushing to get to the hospital. EKG showed atrial flutter with ventricular rate 140 bpm. She was sent to the emergency room, 3 hours later was back in normal sinus rhythm. No medications were given to restore normal sinus rhythm.  Hemoglobin A1c increased on prednisone, into the 8 range, now improved in the 7 range  Previous lab work Total cholesterol 120, LDL in the 50s  She also has a history of sciatica and a meniscus tear on the left. She had a echocardiogram in May 2006 which was essentially normal with ejection fraction 60%.   Stress test in 06, 03 and 01 had been normal.   Allergies  Allergen Reactions  .  Cyclobenzaprine Hypertension  . Cheese Other (See Comments)    bloating  . Ciprofloxacin Hcl Other (See Comments)    Muscle pain    Current Outpatient Prescriptions on File Prior to Visit  Medication Sig Dispense Refill  . ALFALFA PO Take 3 tablets by mouth every morning.    Marland Kitchen aspirin 81 MG EC tablet Take 81 mg by mouth at bedtime.     . B Complex-C (B-COMPLEX WITH VITAMIN C) tablet Take 2 tablets by mouth at bedtime.     . calcium carbonate (OS-CAL) 600 MG TABS tablet Take 600 mg by mouth daily.    . chlorpheniramine-HYDROcodone (TUSSIONEX PENNKINETIC ER) 10-8 MG/5ML SUER Take 5 mLs by mouth every 12 (twelve) hours as needed. 140 mL 0  . cholecalciferol (VITAMIN D) 1000 UNITS tablet Take 4,000 Units by mouth daily.     . clobetasol ointment (TEMOVATE) [id number] % Apply 1 application topically 2 (two) times daily as needed (bug bites). Reported on 06/30/2015    . Coenzyme Q10 (COQ10) 200 MG CAPS Take 1 tablet by mouth daily. 400 mg every am.    . estradiol (ESTRACE VAGINAL) 0.1 MG/GM vaginal cream 1/2 gm once weekly using applicator, apply blueberry sized amount of cream using tip of finger to urethra twice weekly 50 g 4  . fish oil-omega-3 fatty acids 1000 MG capsule Take 2 g by mouth daily.      . fluconazole (DIFLUCAN) 150 MG tablet Take 150 mg by mouth as needed. Reported on 06/30/2015    . fluticasone (FLONASE) 50 MCG/ACT nasal spray Place 2  sprays into both nostrils daily. 16 g 0  . GARLIC 99991111 PO Take 1 tablet by mouth daily. 1 gram    . Glucosamine 500 MG TABS Take 1 tablet by mouth daily.     Marland Kitchen glucose blood (ONE TOUCH ULTRA TEST) test strip Use as instructed 50 each 12  . Insulin Glargine (LANTUS SOLOSTAR) 100 UNIT/ML Solostar Pen Inject 28 Units into the skin at bedtime. (Patient taking differently: Inject 26 Units into the skin at bedtime. ) 15 pen PRN  . Insulin Pen Needle 31G X 5 MM MISC 1 pen by Does not apply route 2 (two) times daily. 90 each 12  . Lancets (ONETOUCH ULTRASOFT)  lancets Use as instructed 100 each 12  . LECITHIN PO Take 1 capsule by mouth every other day.    . Liraglutide (VICTOZA) 18 MG/3ML SOPN Inject 0.2 mLs (1.2 mg total) into the skin once. (Patient taking differently: Inject 1.2 mg into the skin once. Every 8 am) 6 pen 1  . losartan-hydrochlorothiazide (HYZAAR) 100-25 MG per tablet Take 1 tablet by mouth daily. (Patient taking differently: Take 1 tablet by mouth daily. Every am) 90 tablet 1  . MAGNESIUM-POTASSIUM PO Take 1 tablet by mouth 2 (two) times daily. Reported on 06/30/2015    . metformin (FORTAMET) 500 MG (OSM) 24 hr tablet Take 2 tablets (1,000 mg total) by mouth 2 (two) times daily with a meal. 360 tablet 1  . metoprolol succinate (TOPROL XL) 25 MG 24 hr tablet Take 1 tablet (25 mg total) by mouth daily. (Patient taking differently: Take 25 mg by mouth every morning. ) 90 tablet 3  . Multiple Vitamins-Minerals (PRESERVISION AREDS 2) CAPS Take 1 capsule by mouth 2 (two) times daily.    Marland Kitchen nystatin cream (MYCOSTATIN) Apply 1 application topically 2 (two) times daily. 30 g 3  . omeprazole (PRILOSEC) 20 MG capsule Take 20 mg capsule every other day (Patient taking differently: 20 mg every morning. Take 20 mg capsule every other day) 90 capsule 1  . Probiotic Product (PROBIOTIC PO) Take 1 Dose by mouth at bedtime.    Marland Kitchen Propylene Glycol (SYSTANE BALANCE OP) Apply 1 drop to eye as needed.    . raloxifene (EVISTA) 60 MG tablet Take 1 tablet (60 mg total) by mouth daily. (Patient taking differently: Take 60 mg by mouth every morning. ) 90 tablet 1  . rosuvastatin (CRESTOR) 10 MG tablet Take 1 tablet (10 mg total) by mouth daily. (Patient taking differently: Take 10 mg by mouth every other day. ) 90 tablet 1  . sulfacetamide (BLEPH-10) 10 % ophthalmic solution Place 1 drop into both eyes 3 (three) times daily as needed.    . vitamin C (ASCORBIC ACID) 500 MG tablet Take 500 mg by mouth 2 (two) times daily.    . vitamin E (VITAMIN E) 400 UNIT capsule Take  400 Units by mouth daily. Every am     No current facility-administered medications on file prior to visit.    Past Medical History  Diagnosis Date  . Hyperlipidemia   . Hypertension   . Sprain of lumbar region   . Thoracic or lumbosacral neuritis or radiculitis, unspecified   . Allergic rhinitis, cause unspecified   . Diabetes mellitus     type II  . 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 Dermatophytosis of nail   . Esophageal reflux   . Cellulitis and abscess of leg, except foot   . Urinary tract infection, site not specified   . Acute  cystitis   . Conjunctivitis unspecified   . Bronchitis, not specified as acute or chronic   . 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 Renal artery stenosis ([place])   . 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 Appendectomy    . Colonoscopy    . Diagnostic mammogram    . Cataract surgery    . Tonsillectomy    . Eye surgery Bilateral     Cataract Extraction  . Knee arthroscopy Right   . Cardiac catheterization    . Knee arthroplasty Right 03/30/2015    Procedure: COMPUTER ASSISTED TOTAL KNEE ARTHROPLASTY;  Surgeon: Dereck Leep, MD;  Location: [facility] ORS;  Service: Orthopedics;  Laterality: Right;  . Knee closed reduction Right 05/23/2015    Procedure: CLOSED MANIPULATION KNEE;  Surgeon: Dereck Leep, MD;  Location: [facility] ORS;  Service: Orthopedics;  Laterality: Right;    Social History  reports that she has never smoked. She has never used smokeless tobacco. She reports that she does not drink alcohol or use illicit drugs.  Family History family history includes Cancer in her father and mother; Diabetes in her mother; Hypertension in her father and mother.   Review of Systems  Constitutional: Negative.   Respiratory: Negative.   Cardiovascular: Positive for leg swelling.  Gastrointestinal:  Negative.   Musculoskeletal: Negative.   Neurological: Negative.   Hematological: Negative.   Psychiatric/Behavioral: Negative.   All other systems reviewed and are negative.   BP 118/60 mmHg  Pulse 55  Ht 5\' 4"  (1.626 m)  Wt 189 lb (85.73 kg)  BMI 32.43 kg/m2  Physical Exam  Constitutional: She is oriented to person, place, and time. She appears well-developed and well-nourished.  HENT:  Head: Normocephalic.  Nose: Nose normal.  Mouth/Throat: Oropharynx is clear and moist.  Eyes: Conjunctivae are normal. Pupils are equal, round, and reactive to light.  Neck: Normal range of motion. Neck supple. No JVD present. Carotid bruit is present.  Cardiovascular: Normal rate, regular rhythm, S1 normal, S2 normal, normal heart sounds and intact distal pulses.  Exam reveals no gallop and no friction rub.   No murmur heard. Trace nonpitting edema of the right lower extremity  Pulmonary/Chest: Effort normal and breath sounds normal. No respiratory distress. She has no wheezes. She has no rales. She exhibits no tenderness.  Abdominal: Soft. Bowel sounds are normal. She exhibits no distension. There is no tenderness.  Musculoskeletal: Normal range of motion. She exhibits no edema or tenderness.  Lymphadenopathy:    She has no cervical adenopathy.  Neurological: She is alert and oriented to person, place, and time. Coordination normal.  Skin: Skin is warm and dry. No rash noted. No erythema.  Psychiatric: She has a normal mood and affect. Her behavior is normal. Judgment and thought content normal.    Assessment and Plan  Nursing note and vitals reviewed.

## 2015-08-24 NOTE — Assessment & Plan Note (Signed)
We have encouraged continued exercise, careful diet management in an effort to lose weight.  significant dietary indiscretion

## 2015-08-24 NOTE — Assessment & Plan Note (Signed)
Blood pressure is well controlled on today's visit. No changes made to the medications. 

## 2015-08-24 NOTE — Assessment & Plan Note (Signed)
Last carotid ultrasound in 2006 reviewed with the patient, showing no significant disease She has bruits appreciated on exam today We have ordered a carotid ultrasound   Total encounter time more than 25 minutes  Greater than 50% was spent in counseling and coordination of care with the patient

## 2015-08-24 NOTE — Patient Instructions (Signed)
You are doing well.  Please stop the flecainide Start metoprolol 1/2 pill daily (or whole if pill if heart rate is elevated)  We will schedule a carotid ultrasound for bruits b/l  Please call us if you have new issues that need to be addressed before your next appt.  Your physician wants you to follow-up in: 12 months.  You will receive a reminder letter in the mail two months in advance. If you don't receive a letter, please call our office to schedule the follow-up appointment.

## 2015-08-24 NOTE — Assessment & Plan Note (Signed)
She would like to stop the flecainide  We have indicated that she can stop this today  She will stay on one half up to one whole metoprolol daily depending on heart rate and blood pressure

## 2015-08-24 NOTE — Assessment & Plan Note (Signed)
Cholesterol is at goal on the current lipid regimen. No changes to the medications were made.  

## 2015-08-25 DIAGNOSIS — M9903 Segmental and somatic dysfunction of lumbar region: Secondary | ICD-10-CM | POA: Diagnosis not present

## 2015-08-25 DIAGNOSIS — M5033 Other cervical disc degeneration, cervicothoracic region: Secondary | ICD-10-CM | POA: Diagnosis not present

## 2015-08-25 DIAGNOSIS — M9901 Segmental and somatic dysfunction of cervical region: Secondary | ICD-10-CM | POA: Diagnosis not present

## 2015-08-25 DIAGNOSIS — M5416 Radiculopathy, lumbar region: Secondary | ICD-10-CM | POA: Diagnosis not present

## 2015-09-02 DIAGNOSIS — M5033 Other cervical disc degeneration, cervicothoracic region: Secondary | ICD-10-CM | POA: Diagnosis not present

## 2015-09-02 DIAGNOSIS — M5416 Radiculopathy, lumbar region: Secondary | ICD-10-CM | POA: Diagnosis not present

## 2015-09-02 DIAGNOSIS — M9903 Segmental and somatic dysfunction of lumbar region: Secondary | ICD-10-CM | POA: Diagnosis not present

## 2015-09-02 DIAGNOSIS — M9901 Segmental and somatic dysfunction of cervical region: Secondary | ICD-10-CM | POA: Diagnosis not present

## 2015-09-08 DIAGNOSIS — M5033 Other cervical disc degeneration, cervicothoracic region: Secondary | ICD-10-CM | POA: Diagnosis not present

## 2015-09-08 DIAGNOSIS — M9901 Segmental and somatic dysfunction of cervical region: Secondary | ICD-10-CM | POA: Diagnosis not present

## 2015-09-08 DIAGNOSIS — M9903 Segmental and somatic dysfunction of lumbar region: Secondary | ICD-10-CM | POA: Diagnosis not present

## 2015-09-08 DIAGNOSIS — M5416 Radiculopathy, lumbar region: Secondary | ICD-10-CM | POA: Diagnosis not present

## 2015-09-12 ENCOUNTER — Ambulatory Visit: Payer: Medicare Other

## 2015-09-12 DIAGNOSIS — I6529 Occlusion and stenosis of unspecified carotid artery: Secondary | ICD-10-CM

## 2015-09-12 DIAGNOSIS — M79673 Pain in unspecified foot: Secondary | ICD-10-CM

## 2015-09-12 DIAGNOSIS — R0989 Other specified symptoms and signs involving the circulatory and respiratory systems: Secondary | ICD-10-CM | POA: Diagnosis not present

## 2015-09-13 ENCOUNTER — Ambulatory Visit: Payer: Medicare Other | Admitting: Family Medicine

## 2015-09-14 ENCOUNTER — Other Ambulatory Visit: Payer: Self-pay | Admitting: Family Medicine

## 2015-09-14 DIAGNOSIS — J069 Acute upper respiratory infection, unspecified: Secondary | ICD-10-CM

## 2015-09-14 MED ORDER — FLUTICASONE PROPIONATE 50 MCG/ACT NA SUSP: 2.0000 | Freq: Every day | NASAL | Status: DC

## 2015-09-14 NOTE — Telephone Encounter (Signed)
Refill request was sent to Dr. Krichna Sowles for approval and submission.  

## 2015-09-14 NOTE — Telephone Encounter (Signed)
PT NEEDS FLONASE REFILLED. [place]

## 2015-09-14 NOTE — Telephone Encounter (Signed)
Patient requesting refill. 

## 2015-09-16 DIAGNOSIS — M5416 Radiculopathy, lumbar region: Secondary | ICD-10-CM | POA: Diagnosis not present

## 2015-09-16 DIAGNOSIS — M9901 Segmental and somatic dysfunction of cervical region: Secondary | ICD-10-CM | POA: Diagnosis not present

## 2015-09-16 DIAGNOSIS — M5033 Other cervical disc degeneration, cervicothoracic region: Secondary | ICD-10-CM | POA: Diagnosis not present

## 2015-09-16 DIAGNOSIS — M9903 Segmental and somatic dysfunction of lumbar region: Secondary | ICD-10-CM | POA: Diagnosis not present

## 2015-09-23 DIAGNOSIS — M9903 Segmental and somatic dysfunction of lumbar region: Secondary | ICD-10-CM | POA: Diagnosis not present

## 2015-09-23 DIAGNOSIS — M5416 Radiculopathy, lumbar region: Secondary | ICD-10-CM | POA: Diagnosis not present

## 2015-09-23 DIAGNOSIS — M9901 Segmental and somatic dysfunction of cervical region: Secondary | ICD-10-CM | POA: Diagnosis not present

## 2015-09-23 DIAGNOSIS — M5033 Other cervical disc degeneration, cervicothoracic region: Secondary | ICD-10-CM | POA: Diagnosis not present

## 2015-09-27 DIAGNOSIS — Z96651 Presence of right artificial knee joint: Secondary | ICD-10-CM | POA: Diagnosis not present

## 2015-09-30 DIAGNOSIS — M9903 Segmental and somatic dysfunction of lumbar region: Secondary | ICD-10-CM | POA: Diagnosis not present

## 2015-09-30 DIAGNOSIS — M5416 Radiculopathy, lumbar region: Secondary | ICD-10-CM | POA: Diagnosis not present

## 2015-09-30 DIAGNOSIS — M9901 Segmental and somatic dysfunction of cervical region: Secondary | ICD-10-CM | POA: Diagnosis not present

## 2015-09-30 DIAGNOSIS — M5033 Other cervical disc degeneration, cervicothoracic region: Secondary | ICD-10-CM | POA: Diagnosis not present

## 2015-10-03 ENCOUNTER — Other Ambulatory Visit: Payer: Self-pay

## 2015-10-03 MED ORDER — INSULIN PEN NEEDLE 32G X 4 MM MISC: 1.0000 "pen " | Freq: Two times a day (BID) | Status: DC

## 2015-10-07 DIAGNOSIS — M9901 Segmental and somatic dysfunction of cervical region: Secondary | ICD-10-CM | POA: Diagnosis not present

## 2015-10-07 DIAGNOSIS — M5416 Radiculopathy, lumbar region: Secondary | ICD-10-CM | POA: Diagnosis not present

## 2015-10-07 DIAGNOSIS — M9903 Segmental and somatic dysfunction of lumbar region: Secondary | ICD-10-CM | POA: Diagnosis not present

## 2015-10-07 DIAGNOSIS — M5033 Other cervical disc degeneration, cervicothoracic region: Secondary | ICD-10-CM | POA: Diagnosis not present

## 2015-10-13 DIAGNOSIS — M9903 Segmental and somatic dysfunction of lumbar region: Secondary | ICD-10-CM | POA: Diagnosis not present

## 2015-10-13 DIAGNOSIS — M9901 Segmental and somatic dysfunction of cervical region: Secondary | ICD-10-CM | POA: Diagnosis not present

## 2015-10-13 DIAGNOSIS — M5033 Other cervical disc degeneration, cervicothoracic region: Secondary | ICD-10-CM | POA: Diagnosis not present

## 2015-10-13 DIAGNOSIS — M5416 Radiculopathy, lumbar region: Secondary | ICD-10-CM | POA: Diagnosis not present

## 2015-10-14 ENCOUNTER — Ambulatory Visit: Payer: Medicare Other | Admitting: Family Medicine

## 2015-10-20 DIAGNOSIS — M5033 Other cervical disc degeneration, cervicothoracic region: Secondary | ICD-10-CM | POA: Diagnosis not present

## 2015-10-20 DIAGNOSIS — M9901 Segmental and somatic dysfunction of cervical region: Secondary | ICD-10-CM | POA: Diagnosis not present

## 2015-10-20 DIAGNOSIS — M9903 Segmental and somatic dysfunction of lumbar region: Secondary | ICD-10-CM | POA: Diagnosis not present

## 2015-10-20 DIAGNOSIS — M5416 Radiculopathy, lumbar region: Secondary | ICD-10-CM | POA: Diagnosis not present

## 2015-10-21 DIAGNOSIS — Z85820 Personal history of malignant melanoma of skin: Secondary | ICD-10-CM | POA: Diagnosis not present

## 2015-10-21 DIAGNOSIS — X32XXXA Exposure to sunlight, initial encounter: Secondary | ICD-10-CM | POA: Diagnosis not present

## 2015-10-21 DIAGNOSIS — L570 Actinic keratosis: Secondary | ICD-10-CM | POA: Diagnosis not present

## 2015-10-21 DIAGNOSIS — L821 Other seborrheic keratosis: Secondary | ICD-10-CM | POA: Diagnosis not present

## 2015-10-21 DIAGNOSIS — Z85828 Personal history of other malignant neoplasm of skin: Secondary | ICD-10-CM | POA: Diagnosis not present

## 2015-10-21 DIAGNOSIS — H353132 Nonexudative age-related macular degeneration, bilateral, intermediate dry stage: Secondary | ICD-10-CM | POA: Diagnosis not present

## 2015-10-21 DIAGNOSIS — Z08 Encounter for follow-up examination after completed treatment for malignant neoplasm: Secondary | ICD-10-CM | POA: Diagnosis not present

## 2015-10-21 DIAGNOSIS — D485 Neoplasm of uncertain behavior of skin: Secondary | ICD-10-CM | POA: Diagnosis not present

## 2015-10-28 DIAGNOSIS — M5416 Radiculopathy, lumbar region: Secondary | ICD-10-CM | POA: Diagnosis not present

## 2015-10-28 DIAGNOSIS — M5033 Other cervical disc degeneration, cervicothoracic region: Secondary | ICD-10-CM | POA: Diagnosis not present

## 2015-10-28 DIAGNOSIS — M9901 Segmental and somatic dysfunction of cervical region: Secondary | ICD-10-CM | POA: Diagnosis not present

## 2015-10-28 DIAGNOSIS — M9903 Segmental and somatic dysfunction of lumbar region: Secondary | ICD-10-CM | POA: Diagnosis not present

## 2015-11-04 DIAGNOSIS — M9903 Segmental and somatic dysfunction of lumbar region: Secondary | ICD-10-CM | POA: Diagnosis not present

## 2015-11-04 DIAGNOSIS — M5416 Radiculopathy, lumbar region: Secondary | ICD-10-CM | POA: Diagnosis not present

## 2015-11-04 DIAGNOSIS — M5033 Other cervical disc degeneration, cervicothoracic region: Secondary | ICD-10-CM | POA: Diagnosis not present

## 2015-11-04 DIAGNOSIS — M9901 Segmental and somatic dysfunction of cervical region: Secondary | ICD-10-CM | POA: Diagnosis not present

## 2015-11-09 DIAGNOSIS — H16223 Keratoconjunctivitis sicca, not specified as Sjogren's, bilateral: Secondary | ICD-10-CM | POA: Diagnosis not present

## 2015-11-10 DIAGNOSIS — M9901 Segmental and somatic dysfunction of cervical region: Secondary | ICD-10-CM | POA: Diagnosis not present

## 2015-11-10 DIAGNOSIS — M5033 Other cervical disc degeneration, cervicothoracic region: Secondary | ICD-10-CM | POA: Diagnosis not present

## 2015-11-10 DIAGNOSIS — M9903 Segmental and somatic dysfunction of lumbar region: Secondary | ICD-10-CM | POA: Diagnosis not present

## 2015-11-10 DIAGNOSIS — M5416 Radiculopathy, lumbar region: Secondary | ICD-10-CM | POA: Diagnosis not present

## 2015-11-24 DIAGNOSIS — M9903 Segmental and somatic dysfunction of lumbar region: Secondary | ICD-10-CM | POA: Diagnosis not present

## 2015-11-24 DIAGNOSIS — M5033 Other cervical disc degeneration, cervicothoracic region: Secondary | ICD-10-CM | POA: Diagnosis not present

## 2015-11-24 DIAGNOSIS — M5416 Radiculopathy, lumbar region: Secondary | ICD-10-CM | POA: Diagnosis not present

## 2015-11-24 DIAGNOSIS — M9901 Segmental and somatic dysfunction of cervical region: Secondary | ICD-10-CM | POA: Diagnosis not present

## 2015-12-01 DIAGNOSIS — M5416 Radiculopathy, lumbar region: Secondary | ICD-10-CM | POA: Diagnosis not present

## 2015-12-01 DIAGNOSIS — M9903 Segmental and somatic dysfunction of lumbar region: Secondary | ICD-10-CM | POA: Diagnosis not present

## 2015-12-01 DIAGNOSIS — M5033 Other cervical disc degeneration, cervicothoracic region: Secondary | ICD-10-CM | POA: Diagnosis not present

## 2015-12-01 DIAGNOSIS — M9901 Segmental and somatic dysfunction of cervical region: Secondary | ICD-10-CM | POA: Diagnosis not present

## 2015-12-02 ENCOUNTER — Ambulatory Visit
Admission: RE | Admit: 2015-12-02 | Discharge: 2015-12-02 | Disposition: A | Discharge status: Home / Self Care | Payer: Medicare Other | Source: Ambulatory Visit | Attending: Orthopedic Surgery | Admitting: Orthopedic Surgery

## 2015-12-02 ENCOUNTER — Other Ambulatory Visit: Payer: Self-pay | Admitting: Orthopedic Surgery

## 2015-12-02 DIAGNOSIS — R609 Edema, unspecified: Secondary | ICD-10-CM | POA: Insufficient documentation

## 2015-12-02 DIAGNOSIS — R600 Localized edema: Secondary | ICD-10-CM | POA: Diagnosis not present

## 2015-12-08 DIAGNOSIS — M5033 Other cervical disc degeneration, cervicothoracic region: Secondary | ICD-10-CM | POA: Diagnosis not present

## 2015-12-08 DIAGNOSIS — M9901 Segmental and somatic dysfunction of cervical region: Secondary | ICD-10-CM | POA: Diagnosis not present

## 2015-12-08 DIAGNOSIS — M9903 Segmental and somatic dysfunction of lumbar region: Secondary | ICD-10-CM | POA: Diagnosis not present

## 2015-12-08 DIAGNOSIS — M5416 Radiculopathy, lumbar region: Secondary | ICD-10-CM | POA: Diagnosis not present

## 2015-12-09 DIAGNOSIS — H01003 Unspecified blepharitis right eye, unspecified eyelid: Secondary | ICD-10-CM | POA: Diagnosis not present

## 2015-12-12 ENCOUNTER — Encounter: Payer: Self-pay | Admitting: Family Medicine

## 2015-12-12 ENCOUNTER — Ambulatory Visit (INDEPENDENT_AMBULATORY_CARE_PROVIDER_SITE_OTHER): Payer: Medicare Other | Admitting: Family Medicine

## 2015-12-12 VITALS — BP 124/58 | HR 75 | Temp 98.7°F | Resp 16 | Ht 64.0 in | Wt 198.3 lb

## 2015-12-12 DIAGNOSIS — N760 Acute vaginitis: Secondary | ICD-10-CM | POA: Diagnosis not present

## 2015-12-12 DIAGNOSIS — I10 Essential (primary) hypertension: Secondary | ICD-10-CM

## 2015-12-12 DIAGNOSIS — E1165 Type 2 diabetes mellitus with hyperglycemia: Secondary | ICD-10-CM

## 2015-12-12 DIAGNOSIS — IMO0002 Reserved for concepts with insufficient information to code with codable children: Secondary | ICD-10-CM

## 2015-12-12 DIAGNOSIS — J069 Acute upper respiratory infection, unspecified: Secondary | ICD-10-CM

## 2015-12-12 DIAGNOSIS — L03115 Cellulitis of right lower limb: Secondary | ICD-10-CM | POA: Diagnosis not present

## 2015-12-12 DIAGNOSIS — E785 Hyperlipidemia, unspecified: Secondary | ICD-10-CM

## 2015-12-12 DIAGNOSIS — I6529 Occlusion and stenosis of unspecified carotid artery: Secondary | ICD-10-CM

## 2015-12-12 DIAGNOSIS — E113299 Type 2 diabetes mellitus with mild nonproliferative diabetic retinopathy without macular edema, unspecified eye: Secondary | ICD-10-CM | POA: Diagnosis not present

## 2015-12-12 DIAGNOSIS — Z794 Long term (current) use of insulin: Secondary | ICD-10-CM

## 2015-12-12 DIAGNOSIS — R600 Localized edema: Secondary | ICD-10-CM | POA: Diagnosis not present

## 2015-12-12 DIAGNOSIS — I4892 Unspecified atrial flutter: Secondary | ICD-10-CM | POA: Diagnosis not present

## 2015-12-12 LAB — POCT GLYCOSYLATED HEMOGLOBIN (HGB A1C): Hemoglobin A1C: 9.3

## 2015-12-12 MED ORDER — LIRAGLUTIDE 18 MG/3ML ~~LOC~~ SOPN: 1.8000 mg | PEN_INJECTOR | Freq: Once | SUBCUTANEOUS | Status: DC

## 2015-12-12 MED ORDER — HYDROCOD POLST-CPM POLST ER 10-8 MG/5ML PO SUER: 5.0000 mL | Freq: Two times a day (BID) | ORAL | Status: DC | PRN

## 2015-12-12 MED ORDER — FLUCONAZOLE 150 MG PO TABS: 150.0000 mg | ORAL_TABLET | Freq: Every day | ORAL | Status: DC | PRN

## 2015-12-12 MED ORDER — LOSARTAN POTASSIUM-HCTZ 100-25 MG PO TABS: 0.5000 | ORAL_TABLET | Freq: Every day | ORAL | Status: DC

## 2015-12-12 NOTE — Progress Notes (Signed)
Name: Bonnie Rowland   MRN: [id number]    DOB: 1941-02-16   Date:12/12/2015       Progress Note  Subjective  Chief Complaint  Chief Complaint  Patient presents with  . Medication Refill    6 month F/U  . Diabetes    Checks every morning, Blood sugar has been higher due to being on antibiotics- Low-58 Average-110 High-225  . Hypertension    Edema in right leg-seen Dr. Vance Peper due to pit edema, cellulitis and was given antibiotic. But still having swelling and going to see [facility] tomorrow with Maudie Mercury. Dr. Rogers Blocker performed a [place] last December 02, 2015 to make sure patient was not experiencing clots.  . Allergic Rhinitis     Headaches due to allergies  . Hyperlipidemia  . URI    Onset- 1 week, low grade fever, headaches, nasal congestion, productive cough-green mucus. Patient just finished antibiotics but still feels like she has alot of congestion in her chest-and states her cleaner just had pneumonia and came into clean their house.      HPI  DMII: uncontrolled and with diabetic retinopathy, eye exam is up to date. Her hgbA1C has gone up from 6.3% to over 9 today. She states she had stopped eating after knee surgery last Fall, but over the past few months she has noticed increase in appetite, not following a diabetic diet, also had recent cellulitis and glucose has been elevated. Fasting glucose has been low fasting 54 and 68 past two mornings. Usually around 110, highest fasting of 220. She states glucose after lunch has not been checked. We will go down on Lantus by a few units and go up on Victoza dose from 1.2 to 1.8 . Discussed going back to a dietician but she would like to hold off for now. [facility]  HTN: bp is towards low end of normal. No chest pain or palpitation. Advised to take half dose of medication because bp has been low over the past two visits.   Recent cellulitis of right leg: seen by Marylee Floras at [facility], negative  for DVT, took antibiotics as prescribed and finished three days ago. She states it feels better, but right leg is still swollen and has follow up with vascular surgeon tomorrow for further evaluation  Hyperlipidemia: taking Crestor as prescribed and not side effects  URI: she states she has been sick over the past week, just finished antibiotics for cellulitis, she has noticed nasal congestion, low grade fever, productive cough, no SOB, she states she feels it is viral and just wants medication to help with symptoms.     Patient Active Problem List   Diagnosis Date Noted  . Bilateral carotid bruits 08/24/2015  . 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 Atrial flutter ([place]) 02/22/2015  . Chronic pain 01/10/2015  . 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 Fatigue 01/10/2015  . 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 Adiposity 01/10/2015  . Neoplasm of back ([place]) 01/10/2015  . Spinal stenosis, lumbar region, 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  . DDD (degenerative disc disease), lumbar 11/14/2014  . Greater trochanteric bursitis of both hips 11/14/2014  . Piriformis syndrome 11/14/2014  . 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 Chest pain 11/06/2010  . Hyperlipemia 11/07/2009  . Hypertension, benign 11/07/2009  . Atherosclerosis of renal artery ([place]) 11/07/2009  .  Allergic rhinitis 11/27/2007  . Lumbosacral neuritis 01/17/2007    Past Surgical History  Procedure Laterality Date  . Appendectomy    . Colonoscopy    . Diagnostic mammogram    . Cataract surgery    . Tonsillectomy    . Eye surgery Bilateral     Cataract Extraction  . Knee arthroscopy Right   . Cardiac catheterization    . Knee arthroplasty Right 03/30/2015    Procedure: COMPUTER ASSISTED TOTAL KNEE ARTHROPLASTY;  Surgeon: Dereck Leep, MD;  Location: [facility] ORS;  Service: Orthopedics;  Laterality:  Right;  . Knee closed reduction Right 05/23/2015    Procedure: CLOSED MANIPULATION KNEE;  Surgeon: Dereck Leep, MD;  Location: [facility] ORS;  Service: Orthopedics;  Laterality: Right;    Family History  Problem Relation Age of Onset  . Diabetes Mother   . Hypertension Mother   . Cancer Mother   . Hypertension Father   . Cancer Father     Social History   Social History  . Marital Status: Married    Spouse Name: N/A  . Number of Children: N/A  . Years of Education: N/A   Occupational History  . Not on file.   Social History Main Topics  . Smoking status: Never Smoker   . Smokeless tobacco: Never Used  . Alcohol Use: No  . Drug Use: No  . Sexual Activity:    Partners: Male   Other Topics Concern  . Not on file   Social History Narrative     Current outpatient prescriptions:  .  ALFALFA PO, Take 3 tablets by mouth every morning., Disp: , Rfl:  .  aspirin 81 MG EC tablet, Take 81 mg by mouth at bedtime. , Disp: , Rfl:  .  B Complex-C (B-COMPLEX WITH VITAMIN C) tablet, Take 2 tablets by mouth at bedtime. , Disp: , Rfl:  .  calcium carbonate (OS-CAL) 600 MG TABS tablet, Take 600 mg by mouth daily., Disp: , Rfl:  .  cholecalciferol (VITAMIN D) 1000 UNITS tablet, Take 4,000 Units by mouth daily. , Disp: , Rfl:  .  clobetasol ointment (TEMOVATE) [id number] %, Apply 1 application topically 2 (two) times daily as needed (bug bites). Reported on 06/30/2015, Disp: , Rfl:  .  Coenzyme Q10 (COQ10) 200 MG CAPS, Take 1 tablet by mouth daily. 400 mg every am., Disp: , Rfl:  .  estradiol (ESTRACE VAGINAL) 0.1 MG/GM vaginal cream, 1/2 gm once weekly using applicator, apply blueberry sized amount of cream using tip of finger to urethra twice weekly, Disp: 50 g, Rfl: 4 .  fish oil-omega-3 fatty acids 1000 MG capsule, Take 2 g by mouth daily.  , Disp: , Rfl:  .  fluconazole (DIFLUCAN) 150 MG tablet, Take 1 tablet (150 mg total) by mouth daily as needed. Reported on 06/30/2015, Disp: 3 tablet,  Rfl: 0 .  fluticasone (FLONASE) 50 MCG/ACT nasal spray, Place 2 sprays into both nostrils daily., Disp: 16 g, Rfl: 2 .  GARLIC 99991111 PO, Take 1 tablet by mouth daily. 1 gram, Disp: , Rfl:  .  Glucosamine 500 MG TABS, Take 1 tablet by mouth daily. , Disp: , Rfl:  .  glucose blood (ONE TOUCH ULTRA TEST) test strip, Use as instructed, Disp: 50 each, Rfl: 12 .  Insulin Glargine (LANTUS SOLOSTAR) 100 UNIT/ML Solostar Pen, Inject 28 Units into the skin at bedtime. (Patient taking differently: Inject 26 Units into the skin at bedtime. ), Disp: 15 pen, Rfl: PRN .  Insulin Pen Needle 32G X 4 MM MISC, 1 pen by Does not apply route 2 (two) times daily. Use as directed 2 times daily, Disp: 180 each, Rfl: 11 .  Lancets (ONETOUCH ULTRASOFT) lancets, Use as instructed, Disp: 100 each, Rfl: 12 .  LECITHIN PO, Take 1 capsule by mouth every other day., Disp: , Rfl:  .  Liraglutide (VICTOZA) 18 MG/3ML SOPN, Inject 0.2 mLs (1.2 mg total) into the skin once. (Patient taking differently: Inject 1.2 mg into the skin once. Every 8 am), Disp: 6 pen, Rfl: 1 .  losartan-hydrochlorothiazide (HYZAAR) 100-25 MG tablet, Take 0.5 tablets by mouth daily., Disp: 90 tablet, Rfl: 0 .  MAGNESIUM-POTASSIUM PO, Take 1 tablet by mouth 2 (two) times daily. Reported on 06/30/2015, Disp: , Rfl:  .  metformin (FORTAMET) 500 MG (OSM) 24 hr tablet, Take 2 tablets (1,000 mg total) by mouth 2 (two) times daily with a meal., Disp: 360 tablet, Rfl: 1 .  metoprolol succinate (TOPROL XL) 25 MG 24 hr tablet, Take 1 tablet (25 mg total) by mouth daily. (Patient taking differently: Take 25 mg by mouth every morning. ), Disp: 90 tablet, Rfl: 3 .  Multiple Vitamins-Minerals (PRESERVISION AREDS 2) CAPS, Take 1 capsule by mouth 2 (two) times daily., Disp: , Rfl:  .  omeprazole (PRILOSEC) 20 MG capsule, Take 20 mg capsule every other day (Patient taking differently: 20 mg every morning. Take 20 mg capsule every other day), Disp: 90 capsule, Rfl: 1 .  Probiotic  Product (PROBIOTIC PO), Take 1 Dose by mouth at bedtime., Disp: , Rfl:  .  Propylene Glycol (SYSTANE BALANCE OP), Apply 1 drop to eye as needed., Disp: , Rfl:  .  raloxifene (EVISTA) 60 MG tablet, Take 1 tablet (60 mg total) by mouth daily. (Patient taking differently: Take 60 mg by mouth every morning. ), Disp: 90 tablet, Rfl: 1 .  rosuvastatin (CRESTOR) 10 MG tablet, Take 1 tablet (10 mg total) by mouth daily. (Patient taking differently: Take 10 mg by mouth every other day. ), Disp: 90 tablet, Rfl: 1 .  sodium fluoride (DENTAGEL) 1.1 % GEL dental gel, , Disp: , Rfl:  .  sulfacetamide (BLEPH-10) 10 % ophthalmic solution, Place 1 drop into both eyes 3 (three) times daily as needed., Disp: , Rfl:  .  vitamin E (VITAMIN E) 400 UNIT capsule, Take 400 Units by mouth daily. Every am, Disp: , Rfl:  .  chlorpheniramine-HYDROcodone (TUSSIONEX PENNKINETIC ER) 10-8 MG/5ML SUER, Take 5 mLs by mouth every 12 (twelve) hours as needed., Disp: 140 mL, Rfl: 0  Allergies  Allergen Reactions  . Cyclobenzaprine Hypertension  . Cheese Other (See Comments)    bloating  . Ciprofloxacin Hcl Other (See Comments)    Muscle pain     ROS  Ten systems reviewed and is negative except as mentioned in HPI   Objective  Filed Vitals:   12/12/15 1551  BP: 124/58  Pulse: 75  Temp: 98.7 F (37.1 C)  TempSrc: Oral  Resp: 16  Height: 5\' 4"  (1.626 m)  Weight: 198 lb 4.8 oz (89.948 kg)  SpO2: 96%    Body mass index is 34.02 kg/(m^2).  Physical Exam    Constitutional: Patient appears well-developed and well-nourished. Obese No distress.  HEENT: head atraumatic, normocephalic, pupils equal and reactive to light, neck supple, throat within normal limits, boggy turbinates, some pain during percussion of right sinus ( we will avoid antibiotics now since just finished a round ) Cardiovascular: Normal rate, regular rhythm and  normal heart sounds.  No murmur heard. Right 2 plus pitting  leg edema, some  erythema Pulmonary/Chest: Effort normal and breath sounds normal. No respiratory distress. Abdominal: Soft.  There is no tenderness. Psychiatric: Patient has a normal mood and affect. behavior is normal. Judgment and thought content normal.  Recent Results (from the past 2160 hour(s))  POCT HgB A1C     Status: Abnormal   Collection Time: 12/12/15  4:15 PM  Result Value Ref Range   Hemoglobin A1C 9.3     Diabetic Foot Exam: Diabetic Foot Exam - Simple   Simple Foot Form  Diabetic Foot exam was performed with the following findings:  Yes 12/12/2015  4:35 PM  Visual Inspection  No deformities, no ulcerations, no other skin breakdown bilaterally:  Yes  Sensation Testing  Intact to touch and monofilament testing bilaterally:  Yes  Pulse Check  Posterior Tibialis and Dorsalis pulse intact bilaterally:  Yes  Comments       PHQ2/9: Depression screen [facility] 2/9 07/19/2015 01/13/2015 01/10/2015 11/15/2014  Decreased Interest 0 0 0 0  Down, Depressed, Hopeless 0 0 0 0  PHQ - 2 Score 0 0 0 0     Fall Risk: Fall Risk  07/19/2015 03/15/2015 01/13/2015 01/10/2015 12/15/2014  Falls in the past year? No No (No Data) No (No Data)      Assessment & Plan   1. Uncontrolled type 2 diabetes mellitus 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, with long-term current use of insulin ([place])  Needs to resume diet, increase Victoza to 1.8 - POCT HgB A1C - out of control  - Comprehensive metabolic panel - Liraglutide (VICTOZA) 18 MG/3ML SOPN; Inject 0.3 mLs (1.8 mg total) into the skin once.  Dispense: 6 pen; Refill: 1  2. Atrial flutter, unspecified  Resolved, normal sinus   3. Edema of right lower extremity  Has follow up with vascular surgeon in am  4. Hyperlipemia  - Lipid panel  5. Cellulitis of right lower extremity  Still mild erythema but non tender, had right knee replacement in the Fall of 2016 and manipulation Nov 2016  6. Recurrent vaginitis  - fluconazole  (DIFLUCAN) 150 MG tablet; Take 1 tablet (150 mg total) by mouth daily as needed. Reported on 06/30/2015  Dispense: 3 tablet; Refill: 0  7. Upper respiratory infection  - chlorpheniramine-HYDROcodone (TUSSIONEX PENNKINETIC ER) 10-8 MG/5ML SUER; Take 5 mLs by mouth every 12 (twelve) hours as needed.  Dispense: 140 mL; Refill: 0  8. Hypertension, benign  Decrease bp medication since it is towards low end of normal  - losartan-hydrochlorothiazide (HYZAAR) 100-25 MG tablet; Take 0.5 tablets by mouth daily.  Dispense: 90 tablet; Refill: 0 - Comprehensive metabolic panel

## 2015-12-13 DIAGNOSIS — M79609 Pain in unspecified limb: Secondary | ICD-10-CM | POA: Diagnosis not present

## 2015-12-13 DIAGNOSIS — E119 Type 2 diabetes mellitus without complications: Secondary | ICD-10-CM | POA: Diagnosis not present

## 2015-12-13 DIAGNOSIS — M7989 Other specified soft tissue disorders: Secondary | ICD-10-CM | POA: Diagnosis not present

## 2015-12-15 DIAGNOSIS — M5033 Other cervical disc degeneration, cervicothoracic region: Secondary | ICD-10-CM | POA: Diagnosis not present

## 2015-12-15 DIAGNOSIS — M5416 Radiculopathy, lumbar region: Secondary | ICD-10-CM | POA: Diagnosis not present

## 2015-12-15 DIAGNOSIS — M9903 Segmental and somatic dysfunction of lumbar region: Secondary | ICD-10-CM | POA: Diagnosis not present

## 2015-12-15 DIAGNOSIS — M9901 Segmental and somatic dysfunction of cervical region: Secondary | ICD-10-CM | POA: Diagnosis not present

## 2015-12-22 DIAGNOSIS — M5033 Other cervical disc degeneration, cervicothoracic region: Secondary | ICD-10-CM | POA: Diagnosis not present

## 2015-12-22 DIAGNOSIS — M9901 Segmental and somatic dysfunction of cervical region: Secondary | ICD-10-CM | POA: Diagnosis not present

## 2015-12-22 DIAGNOSIS — M5416 Radiculopathy, lumbar region: Secondary | ICD-10-CM | POA: Diagnosis not present

## 2015-12-22 DIAGNOSIS — M9903 Segmental and somatic dysfunction of lumbar region: Secondary | ICD-10-CM | POA: Diagnosis not present

## 2015-12-27 DIAGNOSIS — M5416 Radiculopathy, lumbar region: Secondary | ICD-10-CM | POA: Diagnosis not present

## 2015-12-27 DIAGNOSIS — M5033 Other cervical disc degeneration, cervicothoracic region: Secondary | ICD-10-CM | POA: Diagnosis not present

## 2015-12-27 DIAGNOSIS — M9903 Segmental and somatic dysfunction of lumbar region: Secondary | ICD-10-CM | POA: Diagnosis not present

## 2015-12-27 DIAGNOSIS — M9901 Segmental and somatic dysfunction of cervical region: Secondary | ICD-10-CM | POA: Diagnosis not present

## 2016-01-01 IMAGING — CR DG KNEE COMPLETE 4+V*R*
1 series · 4 of 4 positions shown · non-contrast
Comparison: None.

CLINICAL DATA: Postop knee film

EXAM:
RIGHT KNEE - COMPLETE 4+ VIEW

[Series 1: ap · 0.17mm/px · 4 of 4 slices shown]
[im 1/4]
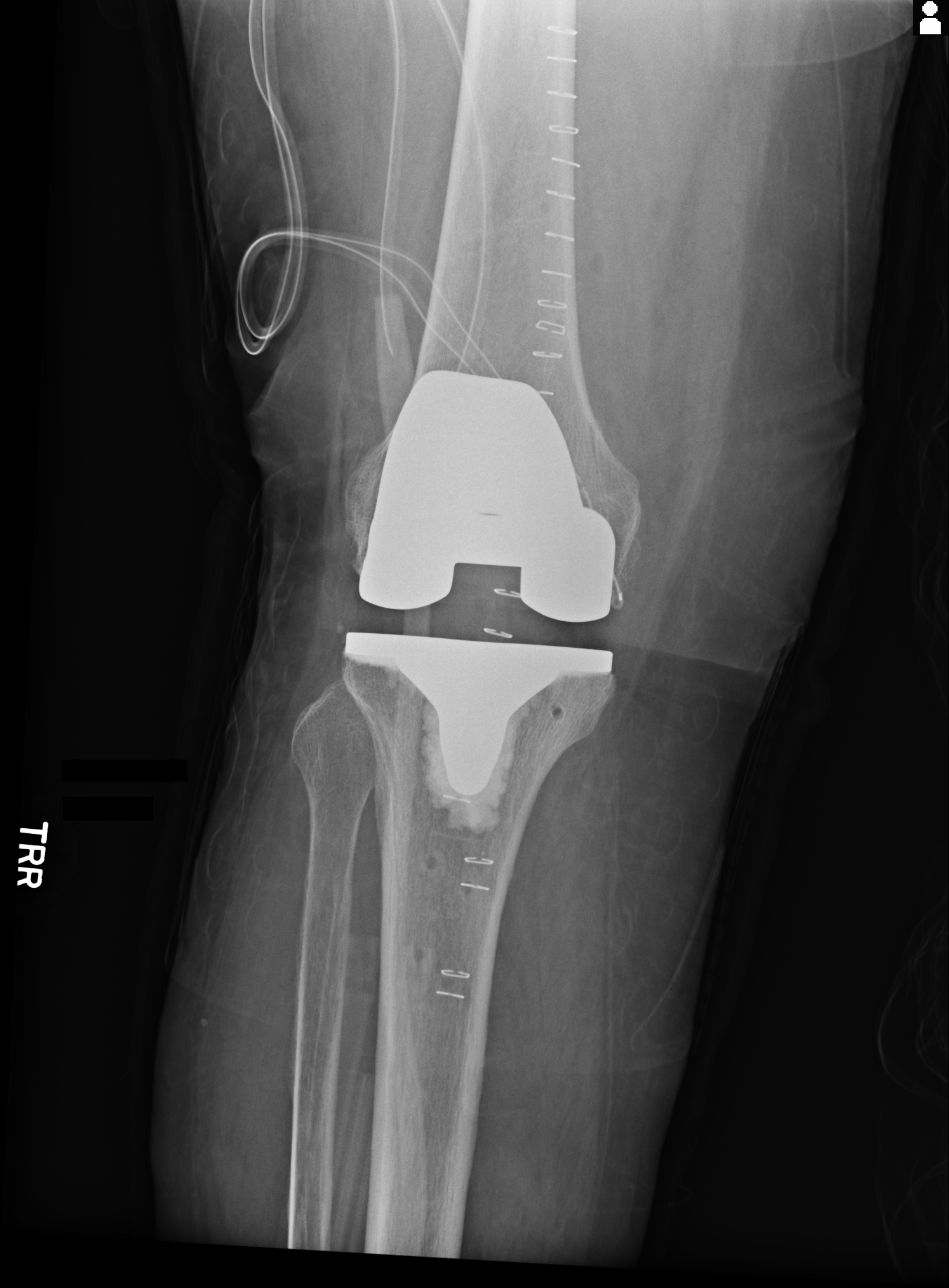
[im 2/4]
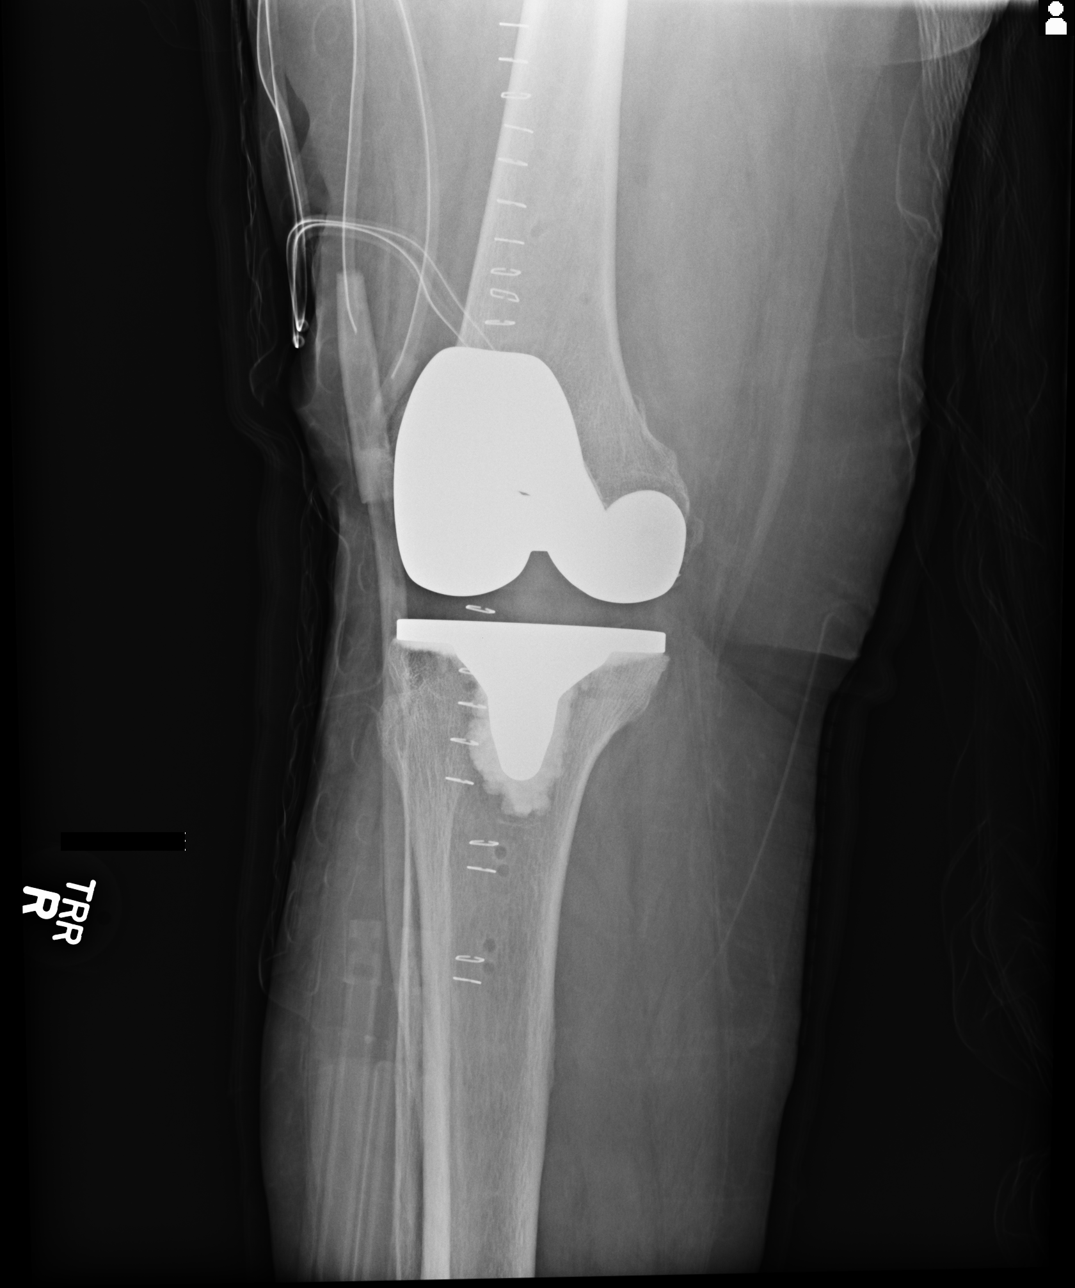
[im 3/4]
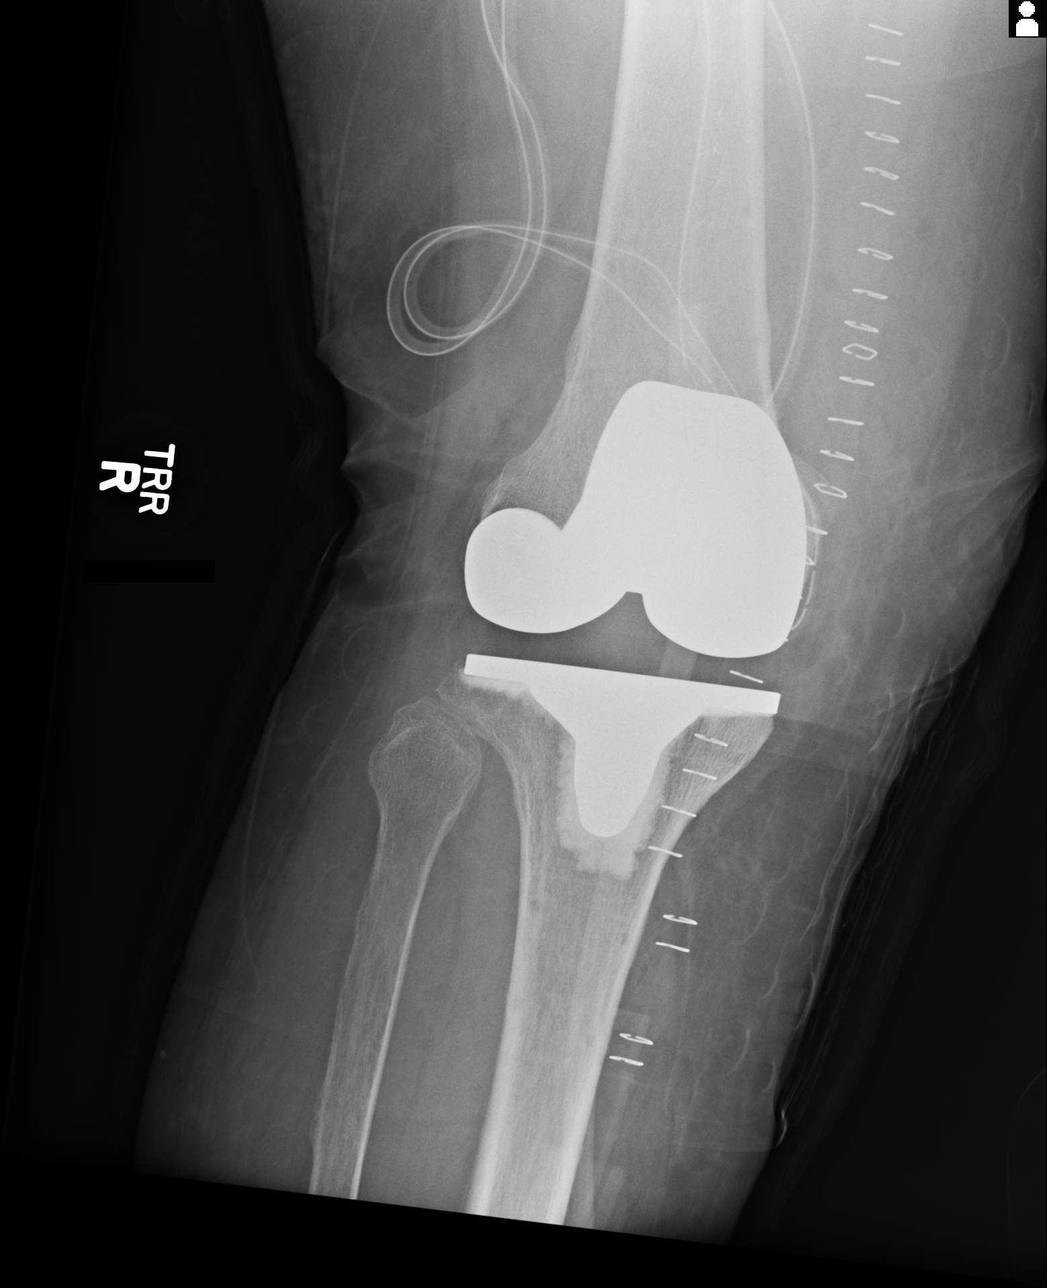
[im 4/4]
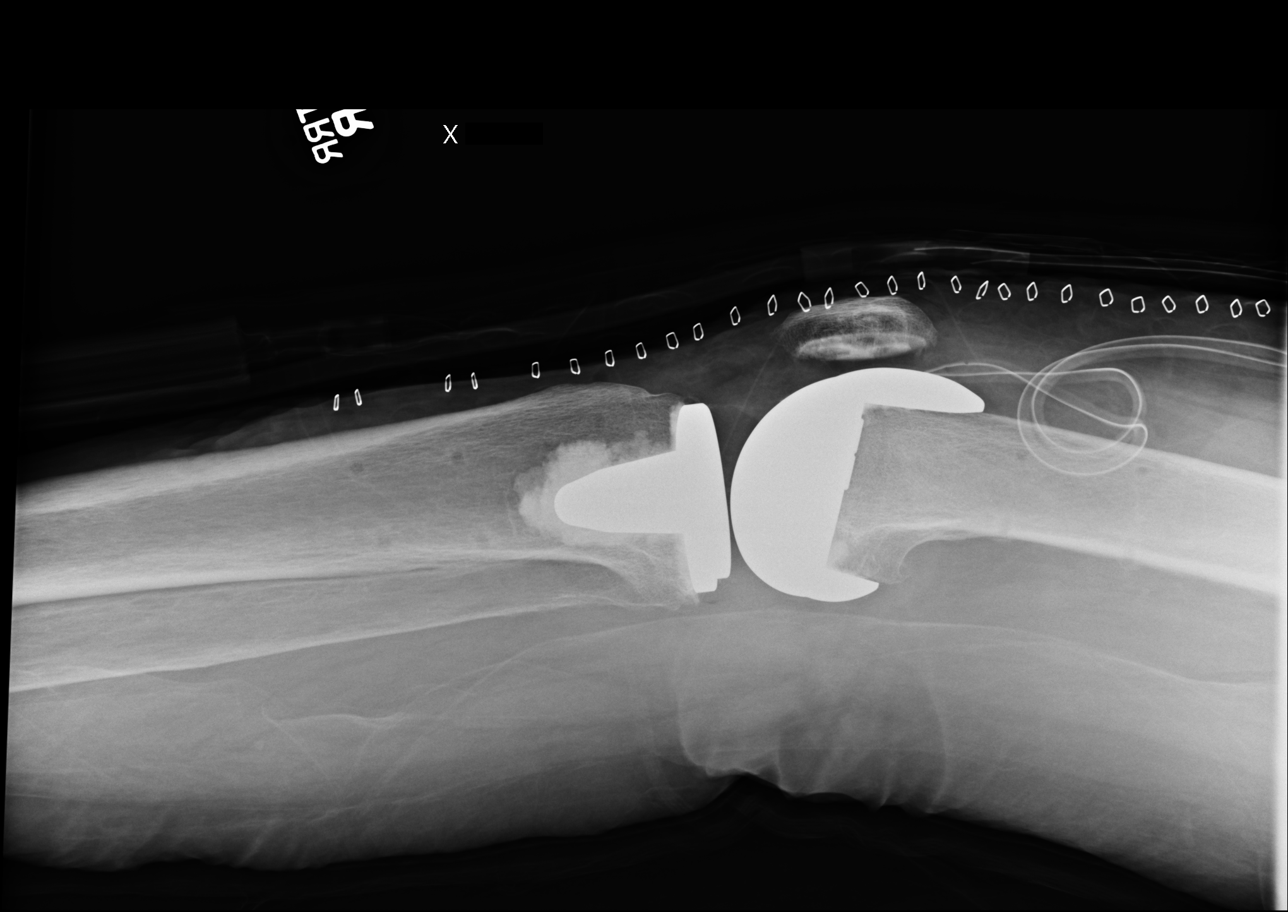

[4 of 4 positions shown; findings below may reference images not displayed]

FINDINGS: Total knee arthroplasty is well seated. A subtle lucency through the
medial supracondylar femur does not continue through cortex; no
periprosthetic fracture suspected. Normal alignment.
IMPRESSION: Recent total knee arthroplasty.

## 2016-01-06 DIAGNOSIS — M9903 Segmental and somatic dysfunction of lumbar region: Secondary | ICD-10-CM | POA: Diagnosis not present

## 2016-01-06 DIAGNOSIS — M5033 Other cervical disc degeneration, cervicothoracic region: Secondary | ICD-10-CM | POA: Diagnosis not present

## 2016-01-06 DIAGNOSIS — M5416 Radiculopathy, lumbar region: Secondary | ICD-10-CM | POA: Diagnosis not present

## 2016-01-06 DIAGNOSIS — M9901 Segmental and somatic dysfunction of cervical region: Secondary | ICD-10-CM | POA: Diagnosis not present

## 2016-01-11 DIAGNOSIS — M79609 Pain in unspecified limb: Secondary | ICD-10-CM | POA: Diagnosis not present

## 2016-01-11 DIAGNOSIS — I890 Lymphedema, not elsewhere classified: Secondary | ICD-10-CM | POA: Diagnosis not present

## 2016-01-11 DIAGNOSIS — M7989 Other specified soft tissue disorders: Secondary | ICD-10-CM | POA: Diagnosis not present

## 2016-01-11 DIAGNOSIS — E119 Type 2 diabetes mellitus without complications: Secondary | ICD-10-CM | POA: Diagnosis not present

## 2016-01-12 DIAGNOSIS — M9903 Segmental and somatic dysfunction of lumbar region: Secondary | ICD-10-CM | POA: Diagnosis not present

## 2016-01-12 DIAGNOSIS — Z794 Long term (current) use of insulin: Secondary | ICD-10-CM | POA: Diagnosis not present

## 2016-01-12 DIAGNOSIS — E785 Hyperlipidemia, unspecified: Secondary | ICD-10-CM | POA: Diagnosis not present

## 2016-01-12 DIAGNOSIS — M9901 Segmental and somatic dysfunction of cervical region: Secondary | ICD-10-CM | POA: Diagnosis not present

## 2016-01-12 DIAGNOSIS — E113299 Type 2 diabetes mellitus with mild nonproliferative diabetic retinopathy without macular edema, unspecified eye: Secondary | ICD-10-CM | POA: Diagnosis not present

## 2016-01-12 DIAGNOSIS — I10 Essential (primary) hypertension: Secondary | ICD-10-CM | POA: Diagnosis not present

## 2016-01-12 DIAGNOSIS — E1165 Type 2 diabetes mellitus with hyperglycemia: Secondary | ICD-10-CM | POA: Diagnosis not present

## 2016-01-12 DIAGNOSIS — M5033 Other cervical disc degeneration, cervicothoracic region: Secondary | ICD-10-CM | POA: Diagnosis not present

## 2016-01-12 DIAGNOSIS — M5416 Radiculopathy, lumbar region: Secondary | ICD-10-CM | POA: Diagnosis not present

## 2016-01-13 LAB — LIPID PANEL
CHOL/HDL RATIO: 1.9 ratio (ref 0.0–4.4)
CHOLESTEROL TOTAL: 110 mg/dL (ref 100–199)
HDL: 59 mg/dL (ref >39)
LDL Calculated: 39 mg/dL (ref 0–99)
TRIGLYCERIDES: 58 mg/dL (ref 0–149)
VLDL Cholesterol Cal: 12 mg/dL (ref 5–40)

## 2016-01-13 LAB — COMPREHENSIVE METABOLIC PANEL
A/G RATIO: 1.4 (ref 1.2–2.2)
ALT: 15 IU/L (ref 0–32)
AST: 21 IU/L (ref 0–40)
Albumin: 3.7 g/dL (ref 3.5–4.8)
Alkaline Phosphatase: 102 IU/L (ref 39–117)
BUN/Creatinine Ratio: 15 (ref 12–28)
BUN: 11 mg/dL (ref 8–27)
Bilirubin Total: 0.5 mg/dL (ref 0.0–1.2)
CALCIUM: 9.4 mg/dL (ref 8.7–10.3)
CO2: 27 mmol/L (ref 18–29)
Chloride: 97 mmol/L (ref 96–106)
Creatinine, Ser: 0.74 mg/dL (ref 0.57–1.00)
GFR, EST AFRICAN AMERICAN: 93 mL/min/{1.73_m2} (ref >59)
GFR, EST NON AFRICAN AMERICAN: 81 mL/min/{1.73_m2} (ref >59)
GLUCOSE: 88 mg/dL (ref 65–99)
Globulin, Total: 2.7 g/dL (ref 1.5–4.5)
Potassium: 4 mmol/L (ref 3.5–5.2)
Sodium: 140 mmol/L (ref 134–144)
TOTAL PROTEIN: 6.4 g/dL (ref 6.0–8.5)

## 2016-01-16 ENCOUNTER — Ambulatory Visit (INDEPENDENT_AMBULATORY_CARE_PROVIDER_SITE_OTHER): Payer: Medicare Other | Admitting: Family Medicine

## 2016-01-16 ENCOUNTER — Encounter: Payer: Self-pay | Admitting: Family Medicine

## 2016-01-16 VITALS — BP 128/68 | HR 75 | Temp 98.5°F | Resp 16 | Wt 195.7 lb

## 2016-01-16 DIAGNOSIS — Z1231 Encounter for screening mammogram for malignant neoplasm of breast: Secondary | ICD-10-CM | POA: Diagnosis not present

## 2016-01-16 DIAGNOSIS — Z794 Long term (current) use of insulin: Secondary | ICD-10-CM | POA: Diagnosis not present

## 2016-01-16 DIAGNOSIS — I6529 Occlusion and stenosis of unspecified carotid artery: Secondary | ICD-10-CM | POA: Diagnosis not present

## 2016-01-16 DIAGNOSIS — E1165 Type 2 diabetes mellitus with hyperglycemia: Secondary | ICD-10-CM | POA: Diagnosis not present

## 2016-01-16 DIAGNOSIS — IMO0002 Reserved for concepts with insufficient information to code with codable children: Secondary | ICD-10-CM

## 2016-01-16 DIAGNOSIS — E113299 Type 2 diabetes mellitus with mild nonproliferative diabetic retinopathy without macular edema, unspecified eye: Secondary | ICD-10-CM | POA: Diagnosis not present

## 2016-01-16 DIAGNOSIS — M545 Low back pain, unspecified: Secondary | ICD-10-CM

## 2016-01-16 DIAGNOSIS — R300 Dysuria: Secondary | ICD-10-CM

## 2016-01-16 DIAGNOSIS — I10 Essential (primary) hypertension: Secondary | ICD-10-CM | POA: Diagnosis not present

## 2016-01-16 LAB — POCT URINALYSIS DIPSTICK
Bilirubin, UA: NEGATIVE
Glucose, UA: NEGATIVE
Ketones, UA: NEGATIVE
NITRITE UA: NEGATIVE
PH UA: 6
Protein, UA: NEGATIVE
RBC UA: NEGATIVE
SPEC GRAV UA: 1.01
UROBILINOGEN UA: 0.2

## 2016-01-16 MED ORDER — INSULIN DEGLUDEC-LIRAGLUTIDE 100-3.6 UNIT-MG/ML ~~LOC~~ SOPN: 50.0000 [IU] | PEN_INJECTOR | Freq: Every day | SUBCUTANEOUS | Status: DC

## 2016-01-16 MED ORDER — INSULIN PEN NEEDLE 32G X 4 MM MISC: 1.0000 "pen " | Freq: Two times a day (BID) | Status: DC

## 2016-01-16 MED ORDER — INSULIN LISPRO 100 UNIT/ML (KWIKPEN): 5.0000 [IU] | PEN_INJECTOR | Freq: Every day | SUBCUTANEOUS | Status: DC

## 2016-01-16 NOTE — Progress Notes (Signed)
Name: Bonnie Rowland   MRN: [id number]    DOB: [birth year]   Date:01/16/2016       Progress Note  Subjective  Chief Complaint  Chief Complaint  Patient presents with  . Follow-up    patient is here for a 35-month f/u  . Urinary Tract Infection    patient stated that she thinks she has a uti, due to lower back pain and burning upon urination that started 2 days ago.    HPI  DMII: uncontrolled and with diabetic retinopathy, eye exam is up to date. Her hgbA1C has gone up from 6.3% to over 9 today. She states she had stopped eating after knee surgery last Fall, but after January she started to eat everything. Fasting glucose has been well controlled between 80's-100's. However post-prandial very high in the 300's range. She tried going up on Victoza but caused diarrhea. She is on Lantus 26 unis and Victoza 1.2. She has been following a diabetic diet, except when travelling. No blurred vision, no polyuria or polydipsia.   Lower extremity edema: seen by vascular surgeon and studies negative so far, possible lymphadenopathy, advised to use compression stocking hoses and elevate leg during the day and she states symptoms have improved  HTN: bp is a little elevated today, still taking all medications, because of her age not very worried about the level today.   Urinary frequency and low back pain: she states she is back at the gym, and noticed mild discomfort with urination and low back pain but it is improving, we will check urine culture before starting antibiotics.    Patient Active Problem List   Diagnosis Date Noted  . Bilateral carotid bruits 08/24/2015  . 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 Atrial flutter ([place]) 02/22/2015  . Chronic pain 01/10/2015  . 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 Fatigue 01/10/2015  . 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 Adiposity  01/10/2015  . Neoplasm of back ([place]) 01/10/2015  . Spinal stenosis, lumbar region, 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  . DDD (degenerative disc disease), lumbar 11/14/2014  . Greater trochanteric bursitis of both hips 11/14/2014  . Piriformis syndrome 11/14/2014  . 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 Chest pain 11/06/2010  . Hyperlipemia 11/07/2009  . Hypertension, benign 11/07/2009  . Atherosclerosis of renal artery ([place]) 11/07/2009  . Allergic rhinitis 11/27/2007  . Lumbosacral neuritis 01/17/2007    Past Surgical History  Procedure Laterality Date  . Appendectomy    . Colonoscopy    . Diagnostic mammogram    . Cataract surgery    . Tonsillectomy    . Eye surgery Bilateral     Cataract Extraction  . Knee arthroscopy Right   . Cardiac catheterization    . Knee arthroplasty Right 03/30/2015    Procedure: COMPUTER ASSISTED TOTAL KNEE ARTHROPLASTY;  Surgeon: Dereck Leep, MD;  Location: [facility] ORS;  Service: Orthopedics;  Laterality: Right;  . Knee closed reduction Right 05/23/2015    Procedure: CLOSED MANIPULATION KNEE;  Surgeon: Dereck Leep, MD;  Location: [facility] ORS;  Service: Orthopedics;  Laterality: Right;    Family History  Problem Relation Age of Onset  . Diabetes Mother   . Hypertension Mother   . Cancer Mother   . Hypertension Father   . Cancer Father     Social History   Social History  . Marital Status: Married    Spouse Name:  N/A  . Number of Children: N/A  . Years of Education: N/A   Occupational History  . Not on file.   Social History Main Topics  . Smoking status: Never Smoker   . Smokeless tobacco: Never Used  . Alcohol Use: No  . Drug Use: No  . Sexual Activity:    Partners: Male   Other Topics Concern  . Not on file   Social History Narrative     Current outpatient prescriptions:  .  ALFALFA PO, Take 3 tablets by mouth every morning., Disp: , Rfl:  .  aspirin 81 MG EC tablet, Take 81 mg by mouth at bedtime. , Disp: , Rfl:   .  B Complex-C (B-COMPLEX WITH VITAMIN C) tablet, Take 2 tablets by mouth at bedtime. , Disp: , Rfl:  .  calcium carbonate (OS-CAL) 600 MG TABS tablet, Take 600 mg by mouth daily., Disp: , Rfl:  .  cholecalciferol (VITAMIN D) 1000 UNITS tablet, Take 4,000 Units by mouth daily. , Disp: , Rfl:  .  clobetasol ointment (TEMOVATE) [id number] %, Apply 1 application topically 2 (two) times daily as needed (bug bites). Reported on 06/30/2015, Disp: , Rfl:  .  Coenzyme Q10 (COQ10) 200 MG CAPS, Take 1 tablet by mouth daily. 400 mg every am., Disp: , Rfl:  .  estradiol (ESTRACE VAGINAL) 0.1 MG/GM vaginal cream, 1/2 gm once weekly using applicator, apply blueberry sized amount of cream using tip of finger to urethra twice weekly, Disp: 50 g, Rfl: 4 .  Eyelid Cleansers (AVENOVA) 0.01 % SOLN, See admin instructions., Disp: , Rfl: 3 .  fish oil-omega-3 fatty acids 1000 MG capsule, Take 2 g by mouth daily.  , Disp: , Rfl:  .  fluconazole (DIFLUCAN) 150 MG tablet, Take 1 tablet (150 mg total) by mouth daily as needed. Reported on 06/30/2015, Disp: 3 tablet, Rfl: 0 .  fluticasone (FLONASE) 50 MCG/ACT nasal spray, Place 2 sprays into both nostrils daily., Disp: 16 g, Rfl: 2 .  GARLIC 99991111 PO, Take 1 tablet by mouth daily. 1 gram, Disp: , Rfl:  .  Glucosamine 500 MG TABS, Take 1 tablet by mouth daily. , Disp: , Rfl:  .  glucose blood (ONE TOUCH ULTRA TEST) test strip, Use as instructed, Disp: 50 each, Rfl: 12 .  Insulin Degludec-Liraglutide (XULTOPHY) 100-3.6 UNIT-MG/ML SOPN, Inject 50 Units into the skin daily. In place of Lantus and Victoza, Disp: 18 mL, Rfl: 0 .  insulin lispro (HUMALOG) 100 UNIT/ML KiwkPen, Inject 0.05-0.1 mLs (5-10 Units total) into the skin daily before lunch., Disp: 15 mL, Rfl: 1 .  Insulin Pen Needle 32G X 4 MM MISC, 1 pen by Does not apply route 2 (two) times daily. Use as directed 2 times daily, Disp: 200 each, Rfl: 2 .  Lancets (ONETOUCH ULTRASOFT) lancets, Use as instructed, Disp: 100 each, Rfl:  12 .  LECITHIN PO, Take 1 capsule by mouth every other day., Disp: , Rfl:  .  losartan-hydrochlorothiazide (HYZAAR) 100-25 MG tablet, Take 0.5 tablets by mouth daily., Disp: 90 tablet, Rfl: 0 .  MAGNESIUM-POTASSIUM PO, Take 1 tablet by mouth 2 (two) times daily. Reported on 06/30/2015, Disp: , Rfl:  .  metformin (FORTAMET) 500 MG (OSM) 24 hr tablet, Take 2 tablets (1,000 mg total) by mouth 2 (two) times daily with a meal., Disp: 360 tablet, Rfl: 1 .  metoprolol succinate (TOPROL XL) 25 MG 24 hr tablet, Take 1 tablet (25 mg total) by mouth daily. (Patient taking differently: Take 25  mg by mouth every morning. ), Disp: 90 tablet, Rfl: 3 .  Multiple Vitamins-Minerals (PRESERVISION AREDS 2) CAPS, Take 1 capsule by mouth 2 (two) times daily., Disp: , Rfl:  .  omeprazole (PRILOSEC) 20 MG capsule, Take 20 mg capsule every other day (Patient taking differently: 20 mg every morning. Take 20 mg capsule every other day), Disp: 90 capsule, Rfl: 1 .  Probiotic Product (PROBIOTIC PO), Take 1 Dose by mouth at bedtime., Disp: , Rfl:  .  Propylene Glycol (SYSTANE BALANCE OP), Apply 1 drop to eye as needed., Disp: , Rfl:  .  raloxifene (EVISTA) 60 MG tablet, Take 1 tablet (60 mg total) by mouth daily. (Patient taking differently: Take 60 mg by mouth every morning. ), Disp: 90 tablet, Rfl: 1 .  rosuvastatin (CRESTOR) 10 MG tablet, Take 1 tablet (10 mg total) by mouth daily. (Patient taking differently: Take 10 mg by mouth every other day. ), Disp: 90 tablet, Rfl: 1 .  sodium fluoride (DENTAGEL) 1.1 % GEL dental gel, , Disp: , Rfl:  .  sulfacetamide (BLEPH-10) 10 % ophthalmic solution, Place 1 drop into both eyes 3 (three) times daily as needed., Disp: , Rfl:  .  vitamin E (VITAMIN E) 400 UNIT capsule, Take 400 Units by mouth daily. Every am, Disp: , Rfl:   Allergies  Allergen Reactions  . Cyclobenzaprine Hypertension  . Cheese Other (See Comments)    bloating  . Ciprofloxacin Hcl Other (See Comments)    Muscle  pain     ROS  Ten systems reviewed and is negative except as mentioned in HPI   Objective  Filed Vitals:   01/16/16 0937  BP: 142/68  Pulse: 75  Temp: 98.5 F (36.9 C)  TempSrc: Oral  Resp: 16  Weight: 195 lb 11.2 oz (88.769 kg)  SpO2: 96%    Body mass index is 33.58 kg/(m^2).  Physical Exam  Constitutional: Patient appears well-developed and well-nourished. Obese  No distress.  HEENT: head atraumatic, normocephalic, pupils equal and reactive to light,, neck supple, throat within normal limits Cardiovascular: Normal rate, regular rhythm and normal heart sounds.  No murmur heard. Trace LE edema left leg and 1 plus right side - we will review records from vascular surgeon when available. . Pulmonary/Chest: Effort normal and breath sounds normal. No respiratory distress. Abdominal: Soft.  There is no tenderness. Psychiatric: Patient has a normal mood and affect. behavior is normal. Judgment and thought content normal.  Recent Results (from the past 2160 hour(s))  POCT HgB A1C     Status: Abnormal   Collection Time: 12/12/15  4:15 PM  Result Value Ref Range   Hemoglobin A1C 9.3   Lipid panel     Status: None   Collection Time: 01/12/16  8:17 AM  Result Value Ref Range   Cholesterol, Total 110 100 - 199 mg/dL   Triglycerides 58 0 - 149 mg/dL   HDL 59 >39 mg/dL   VLDL Cholesterol Cal 12 5 - 40 mg/dL   LDL Calculated 39 0 - 99 mg/dL   Chol/HDL Ratio 1.9 0.0 - 4.4 ratio units    Comment:                                   T. Chol/HDL Ratio  Men  Women                               1/2 Avg.Risk  3.4    3.3                                   Avg.Risk  5.0    4.4                                2X Avg.Risk  9.6    7.1                                3X Avg.Risk 23.4   11.0   Comprehensive metabolic panel     Status: None   Collection Time: 01/12/16  8:17 AM  Result Value Ref Range   Glucose 88 65 - 99 mg/dL   BUN 11 8 - 27 mg/dL    Creatinine, Ser 0.74 0.57 - 1.00 mg/dL   GFR calc non Af Amer 81 >59 mL/min/1.73   GFR calc Af Amer 93 >59 mL/min/1.73   BUN/Creatinine Ratio 15 12 - 28   Sodium 140 134 - 144 mmol/L   Potassium 4.0 3.5 - 5.2 mmol/L   Chloride 97 96 - 106 mmol/L   CO2 27 18 - 29 mmol/L   Calcium 9.4 8.7 - 10.3 mg/dL   Total Protein 6.4 6.0 - 8.5 g/dL   Albumin 3.7 3.5 - 4.8 g/dL   Globulin, Total 2.7 1.5 - 4.5 g/dL   Albumin/Globulin Ratio 1.4 1.2 - 2.2   Bilirubin Total 0.5 0.0 - 1.2 mg/dL   Alkaline Phosphatase 102 39 - 117 IU/L   AST 21 0 - 40 IU/L   ALT 15 0 - 32 IU/L  POCT urinalysis dipstick     Status: Abnormal   Collection Time: 01/16/16  9:55 AM  Result Value Ref Range   Color, UA yellow    Clarity, UA clear    Glucose, UA neg    Bilirubin, UA neg    Ketones, UA neg    Spec Grav, UA 1.010    Blood, UA neg    pH, UA 6.0    Protein, UA neg    Urobilinogen, UA 0.2    Nitrite, UA neg    Leukocytes, UA small (1+) (A) Negative     PHQ2/9: Depression screen [facility] 2/9 01/16/2016 07/19/2015 01/13/2015 01/10/2015 11/15/2014  Decreased Interest 0 0 0 0 0  Down, Depressed, Hopeless 0 0 0 0 0  PHQ - 2 Score 0 0 0 0 0     Fall Risk: Fall Risk  01/16/2016 07/19/2015 03/15/2015 01/13/2015 01/10/2015  Falls in the past year? No No No (No Data) No    Functional Status Survey: Is the patient deaf or have difficulty hearing?: Yes Does the patient have difficulty seeing, even when wearing glasses/contacts?: No Does the patient have difficulty concentrating, remembering, or making decisions?: No Does the patient have difficulty walking or climbing stairs?: No Does the patient have difficulty dressing or bathing?: No Does the patient have difficulty doing errands alone such as visiting a doctor's office or shopping?: No    Assessment & Plan  1. Burning with urination  - POCT urinalysis dipstick - Urine Culture  2. Bilateral low back pain without sciatica  - POCT urinalysis dipstick - Urine  Culture  3. Uncontrolled type 2 diabetes mellitus 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, with long-term current use of insulin (HCC)  - Insulin Degludec-Liraglutide (XULTOPHY) 100-3.6 UNIT-MG/ML SOPN; Inject 50 Units into the skin daily. In place of Lantus and Victoza  Dispense: 18 mL; Refill: 0 - insulin lispro (HUMALOG) 100 UNIT/ML KiwkPen; Inject 0.05-0.1 mLs (5-10 Units total) into the skin daily before lunch.  Dispense: 15 mL; Refill: 1 - Insulin Pen Needle 32G X 4 MM MISC; 1 pen by Does not apply route 2 (two) times daily. Use as directed 2 times daily  Dispense: 200 each; Refill: 2  4. Essential hypertension  Continue medication   5. Encounter for screening mammogram for breast cancer  - MM Digital Screening; Future

## 2016-01-16 NOTE — Patient Instructions (Signed)
Xultophy will replace lantus and Victoza: start at 25 units and monitor glucose, go up on dose if fasting sugar is above 130 fasting - it should stay between 90 -130 when you wake up  We will add Humalog before the largest meal of the day, start with 5 units right before you eat and may go up to 10 units, this insulin will help bring the post-prandial level - and it needs to be below 180 , two hours after you eat.

## 2016-01-17 LAB — URINE CULTURE
COLONY COUNT: NO GROWTH
ORGANISM ID, BACTERIA: NO GROWTH

## 2016-01-19 DIAGNOSIS — M9901 Segmental and somatic dysfunction of cervical region: Secondary | ICD-10-CM | POA: Diagnosis not present

## 2016-01-19 DIAGNOSIS — M5033 Other cervical disc degeneration, cervicothoracic region: Secondary | ICD-10-CM | POA: Diagnosis not present

## 2016-01-19 DIAGNOSIS — M9903 Segmental and somatic dysfunction of lumbar region: Secondary | ICD-10-CM | POA: Diagnosis not present

## 2016-01-19 DIAGNOSIS — M5416 Radiculopathy, lumbar region: Secondary | ICD-10-CM | POA: Diagnosis not present

## 2016-02-02 DIAGNOSIS — M9903 Segmental and somatic dysfunction of lumbar region: Secondary | ICD-10-CM | POA: Diagnosis not present

## 2016-02-02 DIAGNOSIS — M5416 Radiculopathy, lumbar region: Secondary | ICD-10-CM | POA: Diagnosis not present

## 2016-02-02 DIAGNOSIS — M9901 Segmental and somatic dysfunction of cervical region: Secondary | ICD-10-CM | POA: Diagnosis not present

## 2016-02-02 DIAGNOSIS — M5033 Other cervical disc degeneration, cervicothoracic region: Secondary | ICD-10-CM | POA: Diagnosis not present

## 2016-02-09 DIAGNOSIS — M5033 Other cervical disc degeneration, cervicothoracic region: Secondary | ICD-10-CM | POA: Diagnosis not present

## 2016-02-09 DIAGNOSIS — M9903 Segmental and somatic dysfunction of lumbar region: Secondary | ICD-10-CM | POA: Diagnosis not present

## 2016-02-09 DIAGNOSIS — M9901 Segmental and somatic dysfunction of cervical region: Secondary | ICD-10-CM | POA: Diagnosis not present

## 2016-02-09 DIAGNOSIS — M5416 Radiculopathy, lumbar region: Secondary | ICD-10-CM | POA: Diagnosis not present

## 2016-02-13 ENCOUNTER — Telehealth: Payer: Self-pay | Admitting: Family Medicine

## 2016-02-13 ENCOUNTER — Other Ambulatory Visit: Payer: Self-pay | Admitting: Family Medicine

## 2016-02-13 MED ORDER — LIRAGLUTIDE 18 MG/3ML ~~LOC~~ SOPN: 1.8000 mg | PEN_INJECTOR | SUBCUTANEOUS | 2 refills | Status: DC

## 2016-02-13 MED ORDER — INSULIN DEGLUDEC 200 UNIT/ML ~~LOC~~ SOPN: 26.0000 [IU] | PEN_INJECTOR | SUBCUTANEOUS | 0 refills | Status: DC

## 2016-02-13 NOTE — Telephone Encounter (Signed)
Changed from Lantus to [place] - 26 units daily and titrate up by 2 units every other day to have fasting glucose between 100-140 .  Stop xultophy and go on [place], Victoza and continue pre-meal insulin between largest meal of the day

## 2016-02-13 NOTE — Telephone Encounter (Signed)
Please advise this is the patient that Medicare Part D and they will not cover Xultophy.

## 2016-02-14 ENCOUNTER — Other Ambulatory Visit: Payer: Self-pay | Admitting: Family Medicine

## 2016-02-14 MED ORDER — INSULIN GLARGINE 100 UNIT/ML SOLOSTAR PEN: 30.0000 [IU] | PEN_INJECTOR | SUBCUTANEOUS | 2 refills | Status: DC

## 2016-02-14 NOTE — Telephone Encounter (Signed)
Bonnie Rowland was approved by patient Insurance but due to her Insurance it will still cost her $267.68 for a 57 supply. Lantus is preferred with her Insurance, so patient is asking to please switch it back and she take Lantus and Victoza due to cost.

## 2016-02-16 ENCOUNTER — Encounter: Payer: Self-pay | Admitting: Family Medicine

## 2016-02-16 ENCOUNTER — Ambulatory Visit (INDEPENDENT_AMBULATORY_CARE_PROVIDER_SITE_OTHER): Payer: Medicare Other | Admitting: Family Medicine

## 2016-02-16 ENCOUNTER — Telehealth: Payer: Self-pay

## 2016-02-16 VITALS — BP 132/68 | HR 69 | Temp 98.4°F | Resp 18 | Wt 196.3 lb

## 2016-02-16 DIAGNOSIS — J069 Acute upper respiratory infection, unspecified: Secondary | ICD-10-CM

## 2016-02-16 DIAGNOSIS — Z794 Long term (current) use of insulin: Secondary | ICD-10-CM

## 2016-02-16 DIAGNOSIS — R197 Diarrhea, unspecified: Secondary | ICD-10-CM | POA: Diagnosis not present

## 2016-02-16 DIAGNOSIS — I4892 Unspecified atrial flutter: Secondary | ICD-10-CM

## 2016-02-16 DIAGNOSIS — J40 Bronchitis, not specified as acute or chronic: Secondary | ICD-10-CM

## 2016-02-16 DIAGNOSIS — IMO0002 Reserved for concepts with insufficient information to code with codable children: Secondary | ICD-10-CM

## 2016-02-16 DIAGNOSIS — E1165 Type 2 diabetes mellitus with hyperglycemia: Secondary | ICD-10-CM | POA: Diagnosis not present

## 2016-02-16 DIAGNOSIS — I6529 Occlusion and stenosis of unspecified carotid artery: Secondary | ICD-10-CM | POA: Diagnosis not present

## 2016-02-16 DIAGNOSIS — E113299 Type 2 diabetes mellitus with mild nonproliferative diabetic retinopathy without macular edema, unspecified eye: Secondary | ICD-10-CM | POA: Diagnosis not present

## 2016-02-16 MED ORDER — FLUTICASONE FUROATE-VILANTEROL 100-25 MCG/INH IN AEPB: 1.0000 | INHALATION_SPRAY | Freq: Every day | RESPIRATORY_TRACT | 0 refills | Status: DC

## 2016-02-16 MED ORDER — HYDROCOD POLST-CPM POLST ER 10-8 MG/5ML PO SUER: 5.0000 mL | Freq: Two times a day (BID) | ORAL | 0 refills | Status: DC | PRN

## 2016-02-16 NOTE — Telephone Encounter (Signed)
Sorry I can't call in controlled medications

## 2016-02-16 NOTE — Telephone Encounter (Signed)
Pt coming in today

## 2016-02-16 NOTE — Progress Notes (Signed)
Name: Bonnie Rowland   MRN: [id number]    DOB: [birth year]   Date:02/16/2016       Progress Note  Subjective  Chief Complaint  Chief Complaint  Patient presents with  . Cough  . Nasal Congestion  . Sore Throat    HPI  URI/bronchitis: she states she developed a sore throat, followed by nasal congestion, and also clear rhinorrhea and since last night mild mucus drainage. Max temperature 101.4 with a home flexible thermometer at 4 am and she took one dose of Tylenol. She has a cough, no SOB. She is staying hydrated, but has a lack of appetite. Husband has the same symptoms  History of atrial flutter: she states caused by hypokalemia, and is on a beta-blocker and is well controlled. She has taken albuterol in the past without any problems.   DMII: she states that her glucose was up last night at 233 and took 5 units of Humalog and glucose this morning 168. She denies polyphagia or polydipsia   Diarrhea: She has had 5 episodes of watery stools since 4 am. She denies abdominal pain or cramps. She is taking electrolyte solution with very low glucose count.   Patient Active Problem List   Diagnosis Date Noted  . Bilateral carotid bruits 08/24/2015  . 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 Atrial flutter ([place]) 02/22/2015  . Chronic pain 01/10/2015  . 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 Fatigue 01/10/2015  . 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 Adiposity 01/10/2015  . Neoplasm of back ([place]) 01/10/2015  . Spinal stenosis, lumbar region, 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  . DDD (degenerative disc disease), lumbar 11/14/2014  . Greater trochanteric bursitis of both hips 11/14/2014  . Piriformis syndrome 11/14/2014  . 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 Chest pain 11/06/2010  . Hyperlipemia 11/07/2009  . Hypertension, benign 11/07/2009  . Atherosclerosis of  renal artery ([place]) 11/07/2009  . Allergic rhinitis 11/27/2007  . Lumbosacral neuritis 01/17/2007    Past Surgical History:  Procedure Laterality Date  . APPENDECTOMY    . CARDIAC CATHETERIZATION    . cataract surgery    . COLONOSCOPY    . DIAGNOSTIC MAMMOGRAM    . EYE SURGERY Bilateral    Cataract Extraction  . KNEE ARTHROPLASTY Right 03/30/2015   Procedure: COMPUTER ASSISTED TOTAL KNEE ARTHROPLASTY;  Surgeon: Dereck Leep, MD;  Location: [facility] ORS;  Service: Orthopedics;  Laterality: Right;  . KNEE ARTHROSCOPY Right   . KNEE CLOSED REDUCTION Right 05/23/2015   Procedure: CLOSED MANIPULATION KNEE;  Surgeon: Dereck Leep, MD;  Location: [facility] ORS;  Service: Orthopedics;  Laterality: Right;  . TONSILLECTOMY      Family History  Problem Relation Age of Onset  . Diabetes Mother   . Hypertension Mother   . Cancer Mother   . Hypertension Father   . Cancer Father     Social History   Social History  . Marital status: Married    Spouse name: N/A  . Number of children: N/A  . Years of education: N/A   Occupational History  . Not on file.   Social History Main Topics  . Smoking status: Never Smoker  . Smokeless tobacco: Never Used  . Alcohol use No  . Drug use: No  . Sexual activity: Yes    Partners: Male   Other Topics Concern  . Not on file   Social History  Narrative  . No narrative on file     Current Outpatient Prescriptions:  .  ALFALFA PO, Take 3 tablets by mouth every morning., Disp: , Rfl:  .  aspirin 81 MG EC tablet, Take 81 mg by mouth at bedtime. , Disp: , Rfl:  .  B Complex-C (B-COMPLEX WITH VITAMIN C) tablet, Take 2 tablets by mouth at bedtime. , Disp: , Rfl:  .  calcium carbonate (OS-CAL) 600 MG TABS tablet, Take 600 mg by mouth daily., Disp: , Rfl:  .  chlorpheniramine-HYDROcodone (TUSSIONEX PENNKINETIC ER) 10-8 MG/5ML SUER, Take 5 mLs by mouth every 12 (twelve) hours as needed., Disp: 140 mL, Rfl: 0 .  cholecalciferol (VITAMIN D) 1000 UNITS  tablet, Take 4,000 Units by mouth daily. , Disp: , Rfl:  .  clobetasol ointment (TEMOVATE) [id number] %, Apply 1 application topically 2 (two) times daily as needed (bug bites). Reported on 06/30/2015, Disp: , Rfl:  .  Coenzyme Q10 (COQ10) 200 MG CAPS, Take 1 tablet by mouth daily. 400 mg every am., Disp: , Rfl:  .  estradiol (ESTRACE VAGINAL) 0.1 MG/GM vaginal cream, 1/2 gm once weekly using applicator, apply blueberry sized amount of cream using tip of finger to urethra twice weekly, Disp: 50 g, Rfl: 4 .  Eyelid Cleansers (AVENOVA) 0.01 % SOLN, See admin instructions., Disp: , Rfl: 3 .  fish oil-omega-3 fatty acids 1000 MG capsule, Take 2 g by mouth daily.  , Disp: , Rfl:  .  fluconazole (DIFLUCAN) 150 MG tablet, Take 1 tablet (150 mg total) by mouth daily as needed. Reported on 06/30/2015, Disp: 3 tablet, Rfl: 0 .  fluticasone (FLONASE) 50 MCG/ACT nasal spray, Place 2 sprays into both nostrils daily., Disp: 16 g, Rfl: 2 .  fluticasone furoate-vilanterol (BREO ELLIPTA) 100-25 MCG/INH AEPB, Inhale 1 puff into the lungs daily., Disp: 60 each, Rfl: 0 .  GARLIC 99991111 PO, Take 1 tablet by mouth daily. 1 gram, Disp: , Rfl:  .  Glucosamine 500 MG TABS, Take 1 tablet by mouth daily. , Disp: , Rfl:  .  glucose blood (ONE TOUCH ULTRA TEST) test strip, Use as instructed, Disp: 50 each, Rfl: 12 .  Insulin Glargine (LANTUS SOLOSTAR) 100 UNIT/ML Solostar Pen, Inject 30 Units into the skin every morning., Disp: 15 pen, Rfl: 2 .  insulin lispro (HUMALOG) 100 UNIT/ML KiwkPen, Inject 0.05-0.1 mLs (5-10 Units total) into the skin daily before lunch., Disp: 15 mL, Rfl: 1 .  Insulin Pen Needle 32G X 4 MM MISC, 1 pen by Does not apply route 2 (two) times daily. Use as directed 2 times daily, Disp: 200 each, Rfl: 2 .  Lancets (ONETOUCH ULTRASOFT) lancets, Use as instructed, Disp: 100 each, Rfl: 12 .  LECITHIN PO, Take 1 capsule by mouth every other day., Disp: , Rfl:  .  Liraglutide (VICTOZA) 18 MG/3ML SOPN, Inject 0.3 mLs  (1.8 mg total) into the skin every morning., Disp: 5 pen, Rfl: 2 .  losartan-hydrochlorothiazide (HYZAAR) 100-25 MG tablet, Take 0.5 tablets by mouth daily., Disp: 90 tablet, Rfl: 0 .  MAGNESIUM-POTASSIUM PO, Take 1 tablet by mouth 2 (two) times daily. Reported on 06/30/2015, Disp: , Rfl:  .  metformin (FORTAMET) 500 MG (OSM) 24 hr tablet, Take 2 tablets (1,000 mg total) by mouth 2 (two) times daily with a meal., Disp: 360 tablet, Rfl: 1 .  metoprolol succinate (TOPROL XL) 25 MG 24 hr tablet, Take 1 tablet (25 mg total) by mouth daily. (Patient taking differently: Take 25 mg by  mouth every morning. ), Disp: 90 tablet, Rfl: 3 .  Multiple Vitamins-Minerals (PRESERVISION AREDS 2) CAPS, Take 1 capsule by mouth 2 (two) times daily., Disp: , Rfl:  .  omeprazole (PRILOSEC) 20 MG capsule, Take 20 mg capsule every other day (Patient taking differently: 20 mg every morning. Take 20 mg capsule every other day), Disp: 90 capsule, Rfl: 1 .  Probiotic Product (PROBIOTIC PO), Take 1 Dose by mouth at bedtime., Disp: , Rfl:  .  Propylene Glycol (SYSTANE BALANCE OP), Apply 1 drop to eye as needed., Disp: , Rfl:  .  raloxifene (EVISTA) 60 MG tablet, Take 1 tablet (60 mg total) by mouth daily. (Patient taking differently: Take 60 mg by mouth every morning. ), Disp: 90 tablet, Rfl: 1 .  rosuvastatin (CRESTOR) 10 MG tablet, Take 1 tablet (10 mg total) by mouth daily. (Patient taking differently: Take 10 mg by mouth every other day. ), Disp: 90 tablet, Rfl: 1 .  sodium fluoride (DENTAGEL) 1.1 % GEL dental gel, , Disp: , Rfl:  .  sulfacetamide (BLEPH-10) 10 % ophthalmic solution, Place 1 drop into both eyes 3 (three) times daily as needed., Disp: , Rfl:  .  vitamin E (VITAMIN E) 400 UNIT capsule, Take 400 Units by mouth daily. Every am, Disp: , Rfl:   Allergies  Allergen Reactions  . Cyclobenzaprine Hypertension  . Cheese Other (See Comments)    bloating  . Ciprofloxacin Hcl Other (See Comments)    Muscle pain      ROS  Ten systems reviewed and is negative except as mentioned in HPI   Objective  Vitals:   02/16/16 1315  BP: 132/68  Pulse: 69  Resp: 18  Temp: 98.4 F (36.9 C)  SpO2: 96%  Weight: 196 lb 5 oz (89 kg)    Body mass index is 33.7 kg/m.  Physical Exam  Constitutional: Patient appears well-developed and well-nourished. Obese  No distress.  HEENT: head atraumatic, normocephalic, pupils equal and reactive to light, ears normal TM bilatearlly, neck supple, throat has mild erythema.  Cardiovascular: Normal rate, regular rhythm and normal heart sounds.  No murmur heard. No BLE edema. Pulmonary/Chest: Effort normal and breath sounds normal. No respiratory distress. Abdominal: Soft.  There is no tenderness. Psychiatric: Patient has a normal mood and affect. behavior is normal. Judgment and thought content normal.  Recent Results (from the past 2160 hour(s))  POCT HgB A1C     Status: Abnormal   Collection Time: 12/12/15  4:15 PM  Result Value Ref Range   Hemoglobin A1C 9.3   Lipid panel     Status: None   Collection Time: 01/12/16  8:17 AM  Result Value Ref Range   Cholesterol, Total 110 100 - 199 mg/dL   Triglycerides 58 0 - 149 mg/dL   HDL 59 >39 mg/dL   VLDL Cholesterol Cal 12 5 - 40 mg/dL   LDL Calculated 39 0 - 99 mg/dL   Chol/HDL Ratio 1.9 0.0 - 4.4 ratio units    Comment:                                   T. Chol/HDL Ratio                                             Men  Women  1/2 Avg.Risk  3.4    3.3                                   Avg.Risk  5.0    4.4                                2X Avg.Risk  9.6    7.1                                3X Avg.Risk 23.4   11.0   Comprehensive metabolic panel     Status: None   Collection Time: 01/12/16  8:17 AM  Result Value Ref Range   Glucose 88 65 - 99 mg/dL   BUN 11 8 - 27 mg/dL   Creatinine, Ser 0.74 0.57 - 1.00 mg/dL   GFR calc non Af Amer 81 >59 mL/min/1.73   GFR calc Af Amer 93 >59  mL/min/1.73   BUN/Creatinine Ratio 15 12 - 28   Sodium 140 134 - 144 mmol/L   Potassium 4.0 3.5 - 5.2 mmol/L   Chloride 97 96 - 106 mmol/L   CO2 27 18 - 29 mmol/L   Calcium 9.4 8.7 - 10.3 mg/dL   Total Protein 6.4 6.0 - 8.5 g/dL   Albumin 3.7 3.5 - 4.8 g/dL   Globulin, Total 2.7 1.5 - 4.5 g/dL   Albumin/Globulin Ratio 1.4 1.2 - 2.2   Bilirubin Total 0.5 0.0 - 1.2 mg/dL   Alkaline Phosphatase 102 39 - 117 IU/L   AST 21 0 - 40 IU/L   ALT 15 0 - 32 IU/L  POCT urinalysis dipstick     Status: Abnormal   Collection Time: 01/16/16  9:55 AM  Result Value Ref Range   Color, UA yellow    Clarity, UA clear    Glucose, UA neg    Bilirubin, UA neg    Ketones, UA neg    Spec Grav, UA 1.010    Blood, UA neg    pH, UA 6.0    Protein, UA neg    Urobilinogen, UA 0.2    Nitrite, UA neg    Leukocytes, UA small (1+) (A) Negative  Urine Culture     Status: None   Collection Time: 01/16/16 10:08 AM  Result Value Ref Range   Colony Count NO GROWTH    Organism ID, Bacteria NO GROWTH      PHQ2/9: Depression screen [facility] 2/9 02/16/2016 01/16/2016 07/19/2015 01/13/2015 01/10/2015  Decreased Interest 0 0 0 0 0  Down, Depressed, Hopeless 0 0 0 0 0  PHQ - 2 Score 0 0 0 0 0    Fall Risk: Fall Risk  02/16/2016 01/16/2016 07/19/2015 03/15/2015 01/13/2015  Falls in the past year? No No No No (No Data)    Functional Status Survey: Is the patient deaf or have difficulty hearing?: No Does the patient have difficulty seeing, even when wearing glasses/contacts?: No Does the patient have difficulty concentrating, remembering, or making decisions?: No Does the patient have difficulty walking or climbing stairs?: No Does the patient have difficulty dressing or bathing?: No Does the patient have difficulty doing errands alone such as visiting a doctor's office or shopping?: No    Assessment & Plan  1. Upper respiratory infection  Take otc Mucinex.  2. Bronchitis  Likely viral, husband has same symptoms.  She states she had a fever at home, but afebrile in our office and no antipyretics in over 6 hours. We will try Tussionex, and Breo _ discussed need to stop in case of tachycardia. Stay hydrated because of diarrhea - chlorpheniramine-HYDROcodone (TUSSIONEX PENNKINETIC ER) 10-8 MG/5ML SUER; Take 5 mLs by mouth every 12 (twelve) hours as needed.  Dispense: 140 mL; Refill: 0 - fluticasone furoate-vilanterol (BREO ELLIPTA) 100-25 MCG/INH AEPB; Inhale 1 puff into the lungs daily.  Dispense: 60 each; Refill: 0   3. Diarrhea, unspecified type  Advised to continue with hydration solution, rest and may take Imodium if needed.   4. 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    5. Atrial flutter, unspecified  Heart rate is normal , on beta-blocker, should be able to tolerate [facility]

## 2016-02-16 NOTE — Telephone Encounter (Signed)
Patient states she has a cold since Sunday and is about to start taking Mucinex DM and has been taking Tylenol, drinking lots of fluids and resting. Patient states she is starting to blow green mucus, coughing, sore throat and sneezing. Patient wanted to know if you could call her in the hydrocodone cough medication. Patient states you have no openings until September and wanted to avoid antibiotics. Patient has Flonase and has been taking it, but just want the cough syrup to help her get through the symptoms.

## 2016-02-16 NOTE — Telephone Encounter (Signed)
Can you offer patient an appointment for tomorrow?

## 2016-02-17 ENCOUNTER — Telehealth: Payer: Self-pay | Admitting: Family Medicine

## 2016-02-17 ENCOUNTER — Ambulatory Visit: Payer: Medicare Other | Admitting: Family Medicine

## 2016-02-17 ENCOUNTER — Other Ambulatory Visit: Payer: Self-pay | Admitting: Family Medicine

## 2016-02-17 MED ORDER — FIRST-DUKES MOUTHWASH MT SUSP: 15.0000 mL | Freq: Three times a day (TID) | OROMUCOSAL | 0 refills | Status: DC

## 2016-02-17 NOTE — Telephone Encounter (Signed)
Sent [facility]'s mouthwash to her pharmacy

## 2016-02-23 DIAGNOSIS — M9903 Segmental and somatic dysfunction of lumbar region: Secondary | ICD-10-CM | POA: Diagnosis not present

## 2016-02-23 DIAGNOSIS — M5416 Radiculopathy, lumbar region: Secondary | ICD-10-CM | POA: Diagnosis not present

## 2016-02-23 DIAGNOSIS — M5033 Other cervical disc degeneration, cervicothoracic region: Secondary | ICD-10-CM | POA: Diagnosis not present

## 2016-02-23 DIAGNOSIS — M9901 Segmental and somatic dysfunction of cervical region: Secondary | ICD-10-CM | POA: Diagnosis not present

## 2016-03-01 DIAGNOSIS — M5033 Other cervical disc degeneration, cervicothoracic region: Secondary | ICD-10-CM | POA: Diagnosis not present

## 2016-03-01 DIAGNOSIS — M9903 Segmental and somatic dysfunction of lumbar region: Secondary | ICD-10-CM | POA: Diagnosis not present

## 2016-03-01 DIAGNOSIS — M9901 Segmental and somatic dysfunction of cervical region: Secondary | ICD-10-CM | POA: Diagnosis not present

## 2016-03-01 DIAGNOSIS — M5416 Radiculopathy, lumbar region: Secondary | ICD-10-CM | POA: Diagnosis not present

## 2016-03-02 ENCOUNTER — Encounter: Payer: Self-pay | Admitting: Family Medicine

## 2016-03-02 ENCOUNTER — Ambulatory Visit (INDEPENDENT_AMBULATORY_CARE_PROVIDER_SITE_OTHER): Payer: Medicare Other | Admitting: Family Medicine

## 2016-03-02 VITALS — BP 124/64 | HR 66 | Temp 98.4°F | Resp 18 | Ht 64.0 in | Wt 194.4 lb

## 2016-03-02 DIAGNOSIS — J40 Bronchitis, not specified as acute or chronic: Secondary | ICD-10-CM

## 2016-03-02 DIAGNOSIS — Z1211 Encounter for screening for malignant neoplasm of colon: Secondary | ICD-10-CM | POA: Diagnosis not present

## 2016-03-02 DIAGNOSIS — I10 Essential (primary) hypertension: Secondary | ICD-10-CM

## 2016-03-02 DIAGNOSIS — K219 Gastro-esophageal reflux disease without esophagitis: Secondary | ICD-10-CM | POA: Diagnosis not present

## 2016-03-02 DIAGNOSIS — E113299 Type 2 diabetes mellitus with mild nonproliferative diabetic retinopathy without macular edema, unspecified eye: Secondary | ICD-10-CM

## 2016-03-02 DIAGNOSIS — IMO0002 Reserved for concepts with insufficient information to code with codable children: Secondary | ICD-10-CM

## 2016-03-02 DIAGNOSIS — D692 Other nonthrombocytopenic purpura: Secondary | ICD-10-CM | POA: Diagnosis not present

## 2016-03-02 DIAGNOSIS — E785 Hyperlipidemia, unspecified: Secondary | ICD-10-CM | POA: Diagnosis not present

## 2016-03-02 DIAGNOSIS — E1165 Type 2 diabetes mellitus with hyperglycemia: Secondary | ICD-10-CM | POA: Diagnosis not present

## 2016-03-02 DIAGNOSIS — Z23 Encounter for immunization: Secondary | ICD-10-CM

## 2016-03-02 DIAGNOSIS — I6529 Occlusion and stenosis of unspecified carotid artery: Secondary | ICD-10-CM

## 2016-03-02 DIAGNOSIS — Z794 Long term (current) use of insulin: Secondary | ICD-10-CM

## 2016-03-02 MED ORDER — ROSUVASTATIN CALCIUM 10 MG PO TABS: 10.0000 mg | ORAL_TABLET | Freq: Every day | ORAL | 1 refills | Status: DC

## 2016-03-02 MED ORDER — INSULIN GLARGINE 100 UNIT/ML SOLOSTAR PEN: 30.0000 [IU] | PEN_INJECTOR | SUBCUTANEOUS | 2 refills | Status: DC

## 2016-03-02 MED ORDER — METFORMIN HCL ER (OSM) 500 MG PO TB24: 1000.0000 mg | ORAL_TABLET | Freq: Every day | ORAL | 1 refills | Status: DC

## 2016-03-02 MED ORDER — LIRAGLUTIDE 18 MG/3ML ~~LOC~~ SOPN: 1.8000 mg | PEN_INJECTOR | SUBCUTANEOUS | 1 refills | Status: DC

## 2016-03-02 MED ORDER — OMEPRAZOLE 20 MG PO CPDR: DELAYED_RELEASE_CAPSULE | ORAL | 1 refills | Status: DC

## 2016-03-02 MED ORDER — ONETOUCH ULTRASOFT LANCETS MISC: 12 refills | Status: DC

## 2016-03-02 MED ORDER — RALOXIFENE HCL 60 MG PO TABS: 60.0000 mg | ORAL_TABLET | ORAL | 3 refills | Status: DC

## 2016-03-02 MED ORDER — GLUCOSE BLOOD VI STRP: ORAL_STRIP | 2 refills | Status: DC

## 2016-03-02 MED ORDER — LOSARTAN POTASSIUM-HCTZ 100-25 MG PO TABS: 1.0000 | ORAL_TABLET | Freq: Every day | ORAL | 1 refills | Status: DC

## 2016-03-02 NOTE — Patient Instructions (Signed)
Decrease dose of lantus by 2 units every three days to keep fasting glucose between 80-140 Start Humalog right before you eat dinner, start at 4 units and may go up to 8 units to keep 2 hours post-prandial level between 140-180

## 2016-03-02 NOTE — Progress Notes (Signed)
Name: Bonnie Rowland   MRN: [id number]    DOB: Oct 07, 1940   Date:03/02/2016       Progress Note  Subjective  Chief Complaint  Chief Complaint  Patient presents with  . Medication Refill  . URI    1 month follow up    HPI  DMII: uncontrolled and with diabetic retinopathy, eye exam is up to date. Her hgbA1C has gone up from 6.3% to over 9 in July . She is trying to eat last carbohydrates, and fasting glucose has been going down. Having frequent reading of the 60's range and this morning down to 49.  However post-prandial around 200. She is currently on Lantus 26 units, not using pre-meal insulin, Victoza 1.2 and also Metformin supposed to be taking 2000 mg daily but unable to tolerate because of diarrhea. She is currently taking 1000 mg of Metformin and is still having about 3 episodes in am's. Advised her to take 1000 mg ER at night and if still has diarrhea go down to one pill, and if still has symptoms stop and adjust dose of Insulin.   Bronchitis. Feeling better, currently no cough or SOB, only has some nasal stuffiness and some mild facial pain, right now she does not think she needs antibiotics.   GERD: symptoms are controlled with Omeprazole taking it every other day, but discussed possible side effects and long term effects, she will try to wean self off. She denies heartburn but she has occasional dysphagia.   HTN: no chest pain, or palpitation, bp is towards low end of normal    Patient Active Problem List   Diagnosis Date Noted  . Bilateral carotid bruits 08/24/2015  . 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 Atrial flutter ([place]) 02/22/2015  . Chronic pain 01/10/2015  . 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 Fatigue 01/10/2015  . 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 Adiposity 01/10/2015  . Neoplasm of back ([place]) 01/10/2015  . Spinal stenosis, lumbar region,  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  . DDD (degenerative disc disease), lumbar 11/14/2014  . Greater trochanteric bursitis of both hips 11/14/2014  . Piriformis syndrome 11/14/2014  . 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 Chest pain 11/06/2010  . Hyperlipemia 11/07/2009  . Hypertension, benign 11/07/2009  . Atherosclerosis of renal artery ([place]) 11/07/2009  . Allergic rhinitis 11/27/2007  . Lumbosacral neuritis 01/17/2007    Past Surgical History:  Procedure Laterality Date  . APPENDECTOMY    . CARDIAC CATHETERIZATION    . cataract surgery    . COLONOSCOPY    . DIAGNOSTIC MAMMOGRAM    . EYE SURGERY Bilateral    Cataract Extraction  . KNEE ARTHROPLASTY Right 03/30/2015   Procedure: COMPUTER ASSISTED TOTAL KNEE ARTHROPLASTY;  Surgeon: Dereck Leep, MD;  Location: [facility] ORS;  Service: Orthopedics;  Laterality: Right;  . KNEE ARTHROSCOPY Right   . KNEE CLOSED REDUCTION Right 05/23/2015   Procedure: CLOSED MANIPULATION KNEE;  Surgeon: Dereck Leep, MD;  Location: [facility] ORS;  Service: Orthopedics;  Laterality: Right;  . TONSILLECTOMY      Family History  Problem Relation Age of Onset  . Diabetes Mother   . Hypertension Mother   . Cancer Mother   . Hypertension Father   . Cancer Father     Social History   Social History  . Marital status: Married    Spouse name: N/A  . Number of children:  N/A  . Years of education: N/A   Occupational History  . Not on file.   Social History Main Topics  . Smoking status: Never Smoker  . Smokeless tobacco: Never Used  . Alcohol use No  . Drug use: No  . Sexual activity: Yes    Partners: Male   Other Topics Concern  . Not on file   Social History Narrative  . No narrative on file     Current Outpatient Prescriptions:  .  ALFALFA PO, Take 3 tablets by mouth every morning., Disp: , Rfl:  .  aspirin 81 MG EC tablet, Take 81 mg by mouth at bedtime. , Disp: , Rfl:  .  B Complex-C (B-COMPLEX WITH VITAMIN C) tablet, Take 2  tablets by mouth at bedtime. , Disp: , Rfl:  .  calcium carbonate (OS-CAL) 600 MG TABS tablet, Take 600 mg by mouth daily., Disp: , Rfl:  .  cholecalciferol (VITAMIN D) 1000 UNITS tablet, Take 4,000 Units by mouth daily. , Disp: , Rfl:  .  clobetasol ointment (TEMOVATE) [id number] %, Apply 1 application topically 2 (two) times daily as needed (bug bites). Reported on 06/30/2015, Disp: , Rfl:  .  Coenzyme Q10 (COQ10) 200 MG CAPS, Take 1 tablet by mouth daily. 400 mg every am., Disp: , Rfl:  .  Diphenhyd-Hydrocort-Nystatin (FIRST-DUKES MOUTHWASH) SUSP, Use as directed 15 mLs in the mouth or throat 4 (four) times daily -  before meals and at bedtime., Disp: 237 mL, Rfl: 0 .  estradiol (ESTRACE VAGINAL) 0.1 MG/GM vaginal cream, 1/2 gm once weekly using applicator, apply blueberry sized amount of cream using tip of finger to urethra twice weekly, Disp: 50 g, Rfl: 4 .  Eyelid Cleansers (AVENOVA) 0.01 % SOLN, See admin instructions., Disp: , Rfl: 3 .  fish oil-omega-3 fatty acids 1000 MG capsule, Take 2 g by mouth daily.  , Disp: , Rfl:  .  fluticasone (FLONASE) 50 MCG/ACT nasal spray, Place 2 sprays into both nostrils daily., Disp: 16 g, Rfl: 2 .  fluticasone furoate-vilanterol (BREO ELLIPTA) 100-25 MCG/INH AEPB, Inhale 1 puff into the lungs daily., Disp: 60 each, Rfl: 0 .  GARLIC 99991111 PO, Take 1 tablet by mouth daily. 1 gram, Disp: , Rfl:  .  Glucosamine 500 MG TABS, Take 1 tablet by mouth daily. , Disp: , Rfl:  .  glucose blood (ONE TOUCH ULTRA TEST) test strip, Use as instructed, Disp: 50 each, Rfl: 12 .  Insulin Glargine (LANTUS SOLOSTAR) 100 UNIT/ML Solostar Pen, Inject 30 Units into the skin every morning., Disp: 15 pen, Rfl: 2 .  insulin lispro (HUMALOG) 100 UNIT/ML KiwkPen, Inject 0.05-0.1 mLs (5-10 Units total) into the skin daily before lunch., Disp: 15 mL, Rfl: 1 .  Insulin Pen Needle 32G X 4 MM MISC, 1 pen by Does not apply route 2 (two) times daily. Use as directed 2 times daily, Disp: 200 each,  Rfl: 2 .  Lancets (ONETOUCH ULTRASOFT) lancets, Use as instructed, Disp: 100 each, Rfl: 12 .  LECITHIN PO, Take 1 capsule by mouth every other day., Disp: , Rfl:  .  Liraglutide (VICTOZA) 18 MG/3ML SOPN, Inject 0.3 mLs (1.8 mg total) into the skin every morning., Disp: 5 pen, Rfl: 2 .  losartan-hydrochlorothiazide (HYZAAR) 100-25 MG tablet, Take 0.5 tablets by mouth daily., Disp: 90 tablet, Rfl: 0 .  MAGNESIUM-POTASSIUM PO, Take 1 tablet by mouth 2 (two) times daily. Reported on 06/30/2015, Disp: , Rfl:  .  metformin (FORTAMET) 500 MG (OSM) 24  hr tablet, Take 2 tablets (1,000 mg total) by mouth 2 (two) times daily with a meal., Disp: 360 tablet, Rfl: 1 .  metoprolol succinate (TOPROL XL) 25 MG 24 hr tablet, Take 1 tablet (25 mg total) by mouth daily. (Patient taking differently: Take 25 mg by mouth every morning. ), Disp: 90 tablet, Rfl: 3 .  Multiple Vitamins-Minerals (PRESERVISION AREDS 2) CAPS, Take 1 capsule by mouth 2 (two) times daily., Disp: , Rfl:  .  omeprazole (PRILOSEC) 20 MG capsule, Take 20 mg capsule every other day (Patient taking differently: 20 mg every morning. Take 20 mg capsule every other day), Disp: 90 capsule, Rfl: 1 .  Probiotic Product (PROBIOTIC PO), Take 1 Dose by mouth at bedtime., Disp: , Rfl:  .  Propylene Glycol (SYSTANE BALANCE OP), Apply 1 drop to eye as needed., Disp: , Rfl:  .  raloxifene (EVISTA) 60 MG tablet, Take 1 tablet (60 mg total) by mouth daily. (Patient taking differently: Take 60 mg by mouth every morning. ), Disp: 90 tablet, Rfl: 1 .  rosuvastatin (CRESTOR) 10 MG tablet, Take 1 tablet (10 mg total) by mouth daily. (Patient taking differently: Take 10 mg by mouth every other day. ), Disp: 90 tablet, Rfl: 1 .  sodium fluoride (DENTAGEL) 1.1 % GEL dental gel, , Disp: , Rfl:  .  sulfacetamide (BLEPH-10) 10 % ophthalmic solution, Place 1 drop into both eyes 3 (three) times daily as needed., Disp: , Rfl:  .  vitamin E (VITAMIN E) 400 UNIT capsule, Take 400  Units by mouth daily. Every am, Disp: , Rfl:   Allergies  Allergen Reactions  . Cyclobenzaprine Hypertension  . Cheese Other (See Comments)    bloating  . Ciprofloxacin Hcl Other (See Comments)    Muscle pain     ROS  Constitutional: Negative for fever or weight change.  Respiratory: Negative for cough and shortness of breath.   Cardiovascular: Negative for chest pain or palpitations.  Gastrointestinal: Negative for abdominal pain, no bowel changes ( still having diarrhea )  Musculoskeletal: Negative for gait problem or joint swelling.  Skin: Negative for rash.  Neurological: Negative for dizziness or headache.  No other specific complaints in a complete review of systems (except as listed in HPI above).  Objective  Vitals:   03/02/16 0821  BP: 124/64  Pulse: 66  Resp: 18  Temp: 98.4 F (36.9 C)  SpO2: 94%  Weight: 194 lb 7 oz (88.2 kg)  Height: 5\' 4"  (1.626 m)    Body mass index is 33.38 kg/m.  Physical Exam  Constitutional: Patient appears well-developed and well-nourished. Obese No distress.  HEENT: head atraumatic, normocephalic, pupils equal and reactive to light, neck supple, throat within normal limits Cardiovascular: Normal rate, regular rhythm and normal heart sounds.  No murmur heard. No BLE edema. Pulmonary/Chest: Effort normal and breath sounds normal. No respiratory distress. Abdominal: Soft.  There is no tenderness. Psychiatric: Patient has a normal mood and affect. behavior is normal. Judgment and thought content normal. Skin: ecchymosis on arms  Recent Results (from the past 2160 hour(s))  POCT HgB A1C     Status: Abnormal   Collection Time: 12/12/15  4:15 PM  Result Value Ref Range   Hemoglobin A1C 9.3   Lipid panel     Status: None   Collection Time: 01/12/16  8:17 AM  Result Value Ref Range   Cholesterol, Total 110 100 - 199 mg/dL   Triglycerides 58 0 - 149 mg/dL   HDL 59 >39  mg/dL   VLDL Cholesterol Cal 12 5 - 40 mg/dL   LDL Calculated  39 0 - 99 mg/dL   Chol/HDL Ratio 1.9 0.0 - 4.4 ratio units    Comment:                                   T. Chol/HDL Ratio                                             Men  Women                               1/2 Avg.Risk  3.4    3.3                                   Avg.Risk  5.0    4.4                                2X Avg.Risk  9.6    7.1                                3X Avg.Risk 23.4   11.0   Comprehensive metabolic panel     Status: None   Collection Time: 01/12/16  8:17 AM  Result Value Ref Range   Glucose 88 65 - 99 mg/dL   BUN 11 8 - 27 mg/dL   Creatinine, Ser 0.74 0.57 - 1.00 mg/dL   GFR calc non Af Amer 81 >59 mL/min/1.73   GFR calc Af Amer 93 >59 mL/min/1.73   BUN/Creatinine Ratio 15 12 - 28   Sodium 140 134 - 144 mmol/L   Potassium 4.0 3.5 - 5.2 mmol/L   Chloride 97 96 - 106 mmol/L   CO2 27 18 - 29 mmol/L   Calcium 9.4 8.7 - 10.3 mg/dL   Total Protein 6.4 6.0 - 8.5 g/dL   Albumin 3.7 3.5 - 4.8 g/dL   Globulin, Total 2.7 1.5 - 4.5 g/dL   Albumin/Globulin Ratio 1.4 1.2 - 2.2   Bilirubin Total 0.5 0.0 - 1.2 mg/dL   Alkaline Phosphatase 102 39 - 117 IU/L   AST 21 0 - 40 IU/L   ALT 15 0 - 32 IU/L  POCT urinalysis dipstick     Status: Abnormal   Collection Time: 01/16/16  9:55 AM  Result Value Ref Range   Color, UA yellow    Clarity, UA clear    Glucose, UA neg    Bilirubin, UA neg    Ketones, UA neg    Spec Grav, UA 1.010    Blood, UA neg    pH, UA 6.0    Protein, UA neg    Urobilinogen, UA 0.2    Nitrite, UA neg    Leukocytes, UA small (1+) (A) Negative  Urine Culture     Status: None   Collection Time: 01/16/16 10:08 AM  Result Value Ref Range   Colony Count NO GROWTH    Organism ID, Bacteria NO GROWTH       PHQ2/9: Depression screen  [facility] 2/9 03/02/2016 02/16/2016 01/16/2016 07/19/2015 01/13/2015  Decreased Interest 0 0 0 0 0  Down, Depressed, Hopeless 0 0 0 0 0  PHQ - 2 Score 0 0 0 0 0     Fall Risk: Fall Risk  03/02/2016 02/16/2016 01/16/2016 07/19/2015  03/15/2015  Falls in the past year? No No No No No    Functional Status Survey: Is the patient deaf or have difficulty hearing?: Yes Does the patient have difficulty seeing, even when wearing glasses/contacts?: No Does the patient have difficulty concentrating, remembering, or making decisions?: No Does the patient have difficulty walking or climbing stairs?: Yes Does the patient have difficulty dressing or bathing?: No Does the patient have difficulty doing errands alone such as visiting a doctor's office or shopping?: No    Assessment & Plan  1. 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  Dispense: 9 pen; Refill: 1 - Insulin Glargine (LANTUS SOLOSTAR) 100 UNIT/ML Solostar Pen; Inject 30 Units into the skin every morning.  Dispense: 15 pen; Refill: 2 - metformin (FORTAMET) 500 MG (OSM) 24 hr tablet; Take 2 tablets (1,000 mg total) by mouth at bedtime.  Dispense: 180 tablet; Refill: 1 - Lancets (ONETOUCH ULTRASOFT) lancets; Use as instructed  Dispense: 100 each; Refill: 12   2. 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 Hypertension, benign  She is back on one pill daily, denies dizziness, she does not want to go down on dose at this time - losartan-hydrochlorothiazide (HYZAAR) 100-25 MG tablet; Take 1 tablet by mouth daily.  Dispense: 90 tablet; Refill: 1  5. Hyperlipidemia  Currently taking Crestor every other day but explained that is best for her to take it daily  - rosuvastatin (CRESTOR) 10 MG tablet; Take 1 tablet (10 mg total) by mouth daily.  Dispense: 90 tablet; Refill: 1  6. Gastroesophageal reflux  disease without esophagitis  Discussed possible risk of long term use of PPI, she will try to wean off - omeprazole (PRILOSEC) 20 MG capsule; Daily  Dispense: 90 capsule; Refill: 1   7. Needs flu shot  - Flu vaccine HIGH DOSE PF (Fluzone High dose)  8. Colon cancer screening  - Ambulatory referral to General Surgery

## 2016-03-04 ENCOUNTER — Encounter: Payer: Self-pay | Admitting: Family Medicine

## 2016-03-06 ENCOUNTER — Encounter: Payer: Self-pay | Admitting: *Deleted

## 2016-03-08 DIAGNOSIS — M5033 Other cervical disc degeneration, cervicothoracic region: Secondary | ICD-10-CM | POA: Diagnosis not present

## 2016-03-08 DIAGNOSIS — M5416 Radiculopathy, lumbar region: Secondary | ICD-10-CM | POA: Diagnosis not present

## 2016-03-08 DIAGNOSIS — M9901 Segmental and somatic dysfunction of cervical region: Secondary | ICD-10-CM | POA: Diagnosis not present

## 2016-03-08 DIAGNOSIS — M9903 Segmental and somatic dysfunction of lumbar region: Secondary | ICD-10-CM | POA: Diagnosis not present

## 2016-03-15 DIAGNOSIS — M5416 Radiculopathy, lumbar region: Secondary | ICD-10-CM | POA: Diagnosis not present

## 2016-03-15 DIAGNOSIS — M5033 Other cervical disc degeneration, cervicothoracic region: Secondary | ICD-10-CM | POA: Diagnosis not present

## 2016-03-15 DIAGNOSIS — M9903 Segmental and somatic dysfunction of lumbar region: Secondary | ICD-10-CM | POA: Diagnosis not present

## 2016-03-15 DIAGNOSIS — M9901 Segmental and somatic dysfunction of cervical region: Secondary | ICD-10-CM | POA: Diagnosis not present

## 2016-03-22 DIAGNOSIS — M5416 Radiculopathy, lumbar region: Secondary | ICD-10-CM | POA: Diagnosis not present

## 2016-03-22 DIAGNOSIS — M9901 Segmental and somatic dysfunction of cervical region: Secondary | ICD-10-CM | POA: Diagnosis not present

## 2016-03-22 DIAGNOSIS — M9903 Segmental and somatic dysfunction of lumbar region: Secondary | ICD-10-CM | POA: Diagnosis not present

## 2016-03-22 DIAGNOSIS — M5033 Other cervical disc degeneration, cervicothoracic region: Secondary | ICD-10-CM | POA: Diagnosis not present

## 2016-03-27 ENCOUNTER — Ambulatory Visit: Payer: Medicare Other | Admitting: General Surgery

## 2016-03-28 ENCOUNTER — Telehealth: Payer: Self-pay

## 2016-03-28 DIAGNOSIS — N952 Postmenopausal atrophic vaginitis: Secondary | ICD-10-CM

## 2016-03-28 NOTE — Telephone Encounter (Signed)
Pt pharmacy sent a refill of nystatin cream for pt under [facility]' name. Please advise.

## 2016-03-28 NOTE — Telephone Encounter (Signed)
That is fine 

## 2016-03-29 DIAGNOSIS — M9901 Segmental and somatic dysfunction of cervical region: Secondary | ICD-10-CM | POA: Diagnosis not present

## 2016-03-29 DIAGNOSIS — M9903 Segmental and somatic dysfunction of lumbar region: Secondary | ICD-10-CM | POA: Diagnosis not present

## 2016-03-29 DIAGNOSIS — M5416 Radiculopathy, lumbar region: Secondary | ICD-10-CM | POA: Diagnosis not present

## 2016-03-29 DIAGNOSIS — M5033 Other cervical disc degeneration, cervicothoracic region: Secondary | ICD-10-CM | POA: Diagnosis not present

## 2016-03-29 MED ORDER — NYSTATIN-TRIAMCINOLONE 100000-0.1 UNIT/GM-% EX OINT: 1.0000 "application " | TOPICAL_OINTMENT | Freq: Two times a day (BID) | CUTANEOUS | 0 refills | Status: DC

## 2016-03-29 NOTE — Telephone Encounter (Signed)
Refills given.

## 2016-04-02 ENCOUNTER — Ambulatory Visit (INDEPENDENT_AMBULATORY_CARE_PROVIDER_SITE_OTHER): Payer: Medicare Other | Admitting: General Surgery

## 2016-04-02 ENCOUNTER — Encounter: Payer: Self-pay | Admitting: General Surgery

## 2016-04-02 VITALS — BP 130/72 | HR 68 | Resp 14 | Ht 64.0 in | Wt 195.0 lb

## 2016-04-02 DIAGNOSIS — R197 Diarrhea, unspecified: Secondary | ICD-10-CM | POA: Diagnosis not present

## 2016-04-02 DIAGNOSIS — I6529 Occlusion and stenosis of unspecified carotid artery: Secondary | ICD-10-CM

## 2016-04-02 MED ORDER — POLYETHYLENE GLYCOL 3350 17 GM/SCOOP PO POWD: 1.0000 | Freq: Once | ORAL | 0 refills | Status: AC

## 2016-04-02 NOTE — Patient Instructions (Addendum)
Colonoscopy A colonoscopy is an exam to look at the entire large intestine (colon). This exam can help find problems such as tumors, polyps, inflammation, and areas of bleeding. The exam takes about 1 hour.  LET [facility] CARE PROVIDER KNOW ABOUT:   Any allergies you have.  All medicines you are taking, including vitamins, herbs, eye drops, creams, and over-the-counter medicines.  Previous problems you or members of your family have had with the use of anesthetics.  Any blood disorders you have.  Previous surgeries you have had.  Medical conditions you have. RISKS AND COMPLICATIONS  Generally, this is a safe procedure. However, as with any procedure, complications can occur. Possible complications include:  Bleeding.  Tearing or rupture of the colon wall.  Reaction to medicines given during the exam.  Infection (rare). BEFORE THE PROCEDURE   Ask your health care provider about changing or stopping your regular medicines.  You may be prescribed an oral bowel prep. This involves drinking a large amount of medicated liquid, starting the day before your procedure. The liquid will cause you to have multiple loose stools until your stool is almost clear or light green. This cleans out your colon in preparation for the procedure.  Do not eat or drink anything else once you have started the bowel prep, unless your health care provider tells you it is safe to do so.  Arrange for someone to drive you home after the procedure. PROCEDURE   You will be given medicine to help you relax (sedative).  You will lie on your side with your knees bent.  A long, flexible tube with a light and camera on the end (colonoscope) will be inserted through the rectum and into the colon. The camera sends video back to a computer screen as it moves through the colon. The colonoscope also releases carbon dioxide gas to inflate the colon. This helps your health care provider see the area better.  During  the exam, your health care provider may take a small tissue sample (biopsy) to be examined under a microscope if any abnormalities are found.  The exam is finished when the entire colon has been viewed. AFTER THE PROCEDURE   Do not drive for 24 hours after the exam.  You may have a small amount of blood in your stool.  You may pass moderate amounts of gas and have mild abdominal cramping or bloating. This is caused by the gas used to inflate your colon during the exam.  Ask when your test results will be ready and how you will get your results. Make sure you get your test results.   This information is not intended to replace advice given to you by your health care provider. Make sure you discuss any questions you have with your health care provider.   Document Released: 06/15/2000 Document Revised: 04/08/2013 Document Reviewed: 02/23/2013 Elsevier Interactive Patient Education [employer].  The patient is scheduled for a Colonoscopy at [facility] on 05/09/16. They are aware to call the day before to get their arrival time. She will decrease her insulin to 13 units the evening prior to prep and procedure. she will only take her blood pressure medication at 6 am with a small sip of water the morning of. She will stop her Fish Oil one week prior.  Miralax prescription has been sent into the patient's pharmacy. The patient is aware of date and instructions.

## 2016-04-02 NOTE — Progress Notes (Signed)
Patient ID: Bonnie Rowland, female   DOB: 1940-10-06, 75 y.o.   MRN: [id number]  Chief Complaint  Patient presents with  . Colonoscopy    HPI Bonnie Rowland is a 75 y.o. female here today for a evaluation of a screening colonoscopy. Last colonoscopy was done on 11/28/2006. Patient state Has been having diarrhea for many years, and reports this was the indication for screening colonoscopy in 2008. Random biopsies were not obtained. Visually the colon was normal.. She recently was taken  off her Metformin on 03/02/16.  The patient reports that she will have a cluster of bowel movements, up to 5 times in the morning, now down to 2-3 times with medication change. She reports no significant change in her baseline sugars.  No history of blood or mucus in the stools. No abdominal pain or cramps.   The patient has not had any significant reflux symptoms since her 2008 upper endoscopy. eHPI  Past Medical History:  Diagnosis Date  . Acute cystitis   . Allergic rhinitis, cause unspecified   . Atherosclerosis of renal artery ([place])    left  . Bronchitis, not specified as acute or chronic   . 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 Cancer ([place]) 05/2014   basal cell removed left temple  . Cellulitis and abscess of leg, except foot   . Conjunctivitis unspecified   . Dermatophytosis of nail   . Diabetes mellitus    type II  . Esophageal reflux   . Hyperlipidemia   . Hypertension   . 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 Renal artery stenosis ([place])   . Sprain of lumbar region   . Thoracic or lumbosacral neuritis or radiculitis, unspecified   . Urinary tract infection, site not specified     Past Surgical History:  Procedure Laterality Date  . APPENDECTOMY    . CARDIAC CATHETERIZATION    . cataract surgery    . COLONOSCOPY    . DIAGNOSTIC MAMMOGRAM    . EYE SURGERY Bilateral    Cataract Extraction  . KNEE ARTHROPLASTY Right 03/30/2015   Procedure: COMPUTER  ASSISTED TOTAL KNEE ARTHROPLASTY;  Surgeon: Dereck Leep, MD;  Location: [facility] ORS;  Service: Orthopedics;  Laterality: Right;  . KNEE ARTHROSCOPY Right   . KNEE CLOSED REDUCTION Right 05/23/2015   Procedure: CLOSED MANIPULATION KNEE;  Surgeon: Dereck Leep, MD;  Location: [facility] ORS;  Service: Orthopedics;  Laterality: Right;  . TONSILLECTOMY      Family History  Problem Relation Age of Onset  . Diabetes Mother   . Hypertension Mother   . Cancer Mother   . Hypertension Father   . Cancer Father     Social History Social History  Substance Use Topics  . Smoking status: Never Smoker  . Smokeless tobacco: Never Used  . Alcohol use No    Allergies  Allergen Reactions  . Cyclobenzaprine Hypertension  . Cheese Other (See Comments)    bloating  . Ciprofloxacin Hcl Other (See Comments)    Muscle pain    Current Outpatient Prescriptions  Medication Sig Dispense Refill  . ALFALFA PO Take 3 tablets by mouth every morning.    Marland Kitchen aspirin 81 MG EC tablet Take 81 mg by mouth at bedtime.     . B Complex-C (B-COMPLEX WITH VITAMIN C) tablet Take 2 tablets by mouth at bedtime.     . calcium carbonate (OS-CAL) 600 MG TABS tablet Take 600 mg by mouth daily.    Marland Kitchen  cholecalciferol (VITAMIN D) 1000 UNITS tablet Take 4,000 Units by mouth daily.     . clobetasol ointment (TEMOVATE) [id number] % Apply 1 application topically 2 (two) times daily as needed (bug bites). Reported on 06/30/2015    . Coenzyme Q10 (COQ10) 200 MG CAPS Take 1 tablet by mouth daily. 400 mg every am.    . estradiol (ESTRACE VAGINAL) 0.1 MG/GM vaginal cream 1/2 gm once weekly using applicator, apply blueberry sized amount of cream using tip of finger to urethra twice weekly 50 g 4  . Eyelid Cleansers (AVENOVA) 0.01 % SOLN See admin instructions.  3  . fish oil-omega-3 fatty acids 1000 MG capsule Take 2 g by mouth daily.      . fluticasone (FLONASE) 50 MCG/ACT nasal spray Place 2 sprays into both nostrils daily. 16 g 2  . GARLIC 99991111  PO Take 1 tablet by mouth daily. 1 gram    . Glucosamine 500 MG TABS Take 1 tablet by mouth daily.     Marland Kitchen glucose blood (ONE TOUCH ULTRA TEST) test strip Check four times daily 300 each 2  . Insulin Glargine (LANTUS SOLOSTAR) 100 UNIT/ML Solostar Pen Inject 30 Units into the skin every morning. (Patient taking differently: Inject 26 Units into the skin every evening. ) 15 pen 2  . insulin lispro (HUMALOG) 100 UNIT/ML KiwkPen Inject 0.05-0.1 mLs (5-10 Units total) into the skin daily before lunch. 15 mL 1  . Insulin Pen Needle 32G X 4 MM MISC 1 pen by Does not apply route 2 (two) times daily. Use as directed 2 times daily 200 each 2  . Lancets (ONETOUCH ULTRASOFT) lancets Use as instructed 100 each 12  . LECITHIN PO Take 1 capsule by mouth every other day.    . Liraglutide (VICTOZA) 18 MG/3ML SOPN Inject 0.3 mLs (1.8 mg total) into the skin every morning. 9 pen 1  . losartan-hydrochlorothiazide (HYZAAR) 100-25 MG tablet Take 1 tablet by mouth daily. 90 tablet 1  . MAGNESIUM-POTASSIUM PO Take 1 tablet by mouth 2 (two) times daily. Reported on 06/30/2015    . metoprolol succinate (TOPROL XL) 25 MG 24 hr tablet Take 1 tablet (25 mg total) by mouth daily. (Patient taking differently: Take 25 mg by mouth every morning. ) 90 tablet 3  . Multiple Vitamins-Minerals (PRESERVISION AREDS 2) CAPS Take 1 capsule by mouth 2 (two) times daily.    Marland Kitchen nystatin-triamcinolone ointment (MYCOLOG) Apply 1 application topically 2 (two) times daily. 30 g 0  . omeprazole (PRILOSEC) 20 MG capsule Daily (Patient taking differently: Take 20 mg by mouth 2 (two) times daily before a meal. Daily) 90 capsule 1  . Probiotic Product (PROBIOTIC PO) Take 1 Dose by mouth at bedtime.    Marland Kitchen Propylene Glycol (SYSTANE BALANCE OP) Apply 1 drop to eye as needed.    . raloxifene (EVISTA) 60 MG tablet Take 1 tablet (60 mg total) by mouth every morning. 90 tablet 3  . rosuvastatin (CRESTOR) 10 MG tablet Take 1 tablet (10 mg total) by mouth daily.  90 tablet 1  . sodium fluoride (DENTAGEL) 1.1 % GEL dental gel     . sulfacetamide (BLEPH-10) 10 % ophthalmic solution Place 1 drop into both eyes 3 (three) times daily as needed.    . vitamin E (VITAMIN E) 400 UNIT capsule Take 400 Units by mouth daily. Every am    . metformin (FORTAMET) 500 MG (OSM) 24 hr tablet Take 2 tablets (1,000 mg total) by mouth at bedtime. (Patient not taking:  Reported on 04/02/2016) 180 tablet 1   No current facility-administered medications for this visit.     Review of Systems Review of Systems  Constitutional: Negative.   Respiratory: Negative.   Cardiovascular: Negative.   Gastrointestinal: Positive for diarrhea.    Blood pressure 130/72, pulse 68, resp. rate 14, height 5\' 4"  (1.626 m), weight 195 lb (88.5 kg).  Physical Exam Physical Exam  Constitutional: She is oriented to person, place, and time. She appears well-developed and well-nourished.  Eyes: Conjunctivae are normal. No scleral icterus.  Neck: Neck supple.  Cardiovascular: Normal rate, regular rhythm and normal heart sounds.   Pulmonary/Chest: Effort normal and breath sounds normal.  Lymphadenopathy:    She has no cervical adenopathy.  Neurological: She is alert and oriented to person, place, and time.  Skin: Skin is warm and dry.    Data Reviewed Upper endoscopy dated 11/27/2016 showed ileus fluid in the stomach, otherwise normal exam.  Colonoscopy of the same date showed a normal exam.  Assessment    Persistent diarrhea, likely related to metformin therapy.    Plan    Screening exam would typically be scheduled in 2018, as she is still noticing loose stools with discontinuation of metformin its reasonable to complete the study at this time. Assuming a negative exam it will not increase the total number of examination June she'll require before screening stops at age 3.     Colonoscopy with possible biopsy/polypectomy prn: Information regarding the procedure, including its  potential risks and complications (including but not limited to perforation of the bowel, which may require emergency surgery to repair, and bleeding) was verbally given to the patient. Educational information regarding lower intestinal endoscopy was given to the patient. Written instructions for how to complete the bowel prep using Miralax were provided. The importance of drinking ample fluids to avoid dehydration as a result of the prep emphasized.  The patient is scheduled for a Colonoscopy at [facility] on 05/09/16. They are aware to call the day before to get their arrival time. She will decrease her insulin to 13 units the evening prior to prep and procedure. she will only take her blood pressure medication at 6 am with a small sip of water the morning of. She will stop her Fish Oil one week prior.  Miralax prescription has been sent into the patient's pharmacy. The patient is aware of date and instructions.    This information has been scribed by Gaspar Cola CMA.   Robert Bellow 04/03/2016, 4:06 PM

## 2016-04-03 DIAGNOSIS — R197 Diarrhea, unspecified: Secondary | ICD-10-CM | POA: Insufficient documentation

## 2016-04-04 ENCOUNTER — Ambulatory Visit: Payer: Medicare Other | Admitting: Family Medicine

## 2016-04-05 DIAGNOSIS — M9903 Segmental and somatic dysfunction of lumbar region: Secondary | ICD-10-CM | POA: Diagnosis not present

## 2016-04-05 DIAGNOSIS — M5033 Other cervical disc degeneration, cervicothoracic region: Secondary | ICD-10-CM | POA: Diagnosis not present

## 2016-04-05 DIAGNOSIS — M5416 Radiculopathy, lumbar region: Secondary | ICD-10-CM | POA: Diagnosis not present

## 2016-04-05 DIAGNOSIS — M9901 Segmental and somatic dysfunction of cervical region: Secondary | ICD-10-CM | POA: Diagnosis not present

## 2016-04-12 DIAGNOSIS — M5416 Radiculopathy, lumbar region: Secondary | ICD-10-CM | POA: Diagnosis not present

## 2016-04-12 DIAGNOSIS — M9903 Segmental and somatic dysfunction of lumbar region: Secondary | ICD-10-CM | POA: Diagnosis not present

## 2016-04-12 DIAGNOSIS — M9901 Segmental and somatic dysfunction of cervical region: Secondary | ICD-10-CM | POA: Diagnosis not present

## 2016-04-12 DIAGNOSIS — M5033 Other cervical disc degeneration, cervicothoracic region: Secondary | ICD-10-CM | POA: Diagnosis not present

## 2016-04-20 DIAGNOSIS — M5416 Radiculopathy, lumbar region: Secondary | ICD-10-CM | POA: Diagnosis not present

## 2016-04-20 DIAGNOSIS — M9901 Segmental and somatic dysfunction of cervical region: Secondary | ICD-10-CM | POA: Diagnosis not present

## 2016-04-20 DIAGNOSIS — M9903 Segmental and somatic dysfunction of lumbar region: Secondary | ICD-10-CM | POA: Diagnosis not present

## 2016-04-20 DIAGNOSIS — M5033 Other cervical disc degeneration, cervicothoracic region: Secondary | ICD-10-CM | POA: Diagnosis not present

## 2016-04-23 ENCOUNTER — Ambulatory Visit (INDEPENDENT_AMBULATORY_CARE_PROVIDER_SITE_OTHER): Payer: Medicare Other | Admitting: Family Medicine

## 2016-04-23 ENCOUNTER — Encounter: Payer: Self-pay | Admitting: Family Medicine

## 2016-04-23 VITALS — BP 138/62 | HR 70 | Temp 97.7°F | Resp 16 | Ht 64.0 in | Wt 190.4 lb

## 2016-04-23 DIAGNOSIS — J3089 Other allergic rhinitis: Secondary | ICD-10-CM | POA: Diagnosis not present

## 2016-04-23 DIAGNOSIS — E1129 Type 2 diabetes mellitus with other diabetic kidney complication: Secondary | ICD-10-CM

## 2016-04-23 DIAGNOSIS — R809 Proteinuria, unspecified: Secondary | ICD-10-CM | POA: Diagnosis not present

## 2016-04-23 DIAGNOSIS — IMO0002 Reserved for concepts with insufficient information to code with codable children: Secondary | ICD-10-CM

## 2016-04-23 DIAGNOSIS — R197 Diarrhea, unspecified: Secondary | ICD-10-CM | POA: Diagnosis not present

## 2016-04-23 DIAGNOSIS — E1165 Type 2 diabetes mellitus with hyperglycemia: Secondary | ICD-10-CM | POA: Diagnosis not present

## 2016-04-23 DIAGNOSIS — I6529 Occlusion and stenosis of unspecified carotid artery: Secondary | ICD-10-CM

## 2016-04-23 DIAGNOSIS — Z794 Long term (current) use of insulin: Secondary | ICD-10-CM | POA: Diagnosis not present

## 2016-04-23 DIAGNOSIS — I10 Essential (primary) hypertension: Secondary | ICD-10-CM | POA: Diagnosis not present

## 2016-04-23 DIAGNOSIS — E113299 Type 2 diabetes mellitus with mild nonproliferative diabetic retinopathy without macular edema, unspecified eye: Secondary | ICD-10-CM

## 2016-04-23 DIAGNOSIS — E7800 Pure hypercholesterolemia, unspecified: Secondary | ICD-10-CM | POA: Diagnosis not present

## 2016-04-23 DIAGNOSIS — J069 Acute upper respiratory infection, unspecified: Secondary | ICD-10-CM

## 2016-04-23 DIAGNOSIS — D692 Other nonthrombocytopenic purpura: Secondary | ICD-10-CM | POA: Diagnosis not present

## 2016-04-23 DIAGNOSIS — B9789 Other viral agents as the cause of diseases classified elsewhere: Secondary | ICD-10-CM

## 2016-04-23 LAB — POCT GLYCOSYLATED HEMOGLOBIN (HGB A1C): HEMOGLOBIN A1C: 9.1

## 2016-04-23 LAB — POCT UA - MICROALBUMIN: MICROALBUMIN (UR) POC: 50 mg/L

## 2016-04-23 MED ORDER — INSULIN GLARGINE 100 UNIT/ML SOLOSTAR PEN: 24.0000 [IU] | PEN_INJECTOR | SUBCUTANEOUS | 2 refills | Status: DC

## 2016-04-23 MED ORDER — NATEGLINIDE 60 MG PO TABS: 60.0000 mg | ORAL_TABLET | Freq: Three times a day (TID) | ORAL | 2 refills | Status: DC

## 2016-04-23 MED ORDER — LORATADINE 10 MG PO TABS: 10.0000 mg | ORAL_TABLET | Freq: Every day | ORAL | 11 refills | Status: DC

## 2016-04-23 NOTE — Progress Notes (Signed)
Name: Bonnie Rowland   MRN: [id number]    DOB: Sep 04, 1940   Date:04/23/2016       Progress Note  Subjective  Chief Complaint  Chief Complaint  Patient presents with  . Hypertension  . Hyperlipidemia  . Diabetes    low 44 high 193 avg 116 checks daily  . Nasal Congestion  . Facial Pain    for about a week been taking musinex    HPI  DMII: uncontrolled and with diabetic retinopathy, eye exam is up to date. Her hgbA1C has gone up from 6.3% back in 05/2015  to over 9  In June 2017. She states she had stopped eating after knee surgery last Fall,however since she recovered she started to eat a lot of bread again, she states she does not have desserts on a regular basis. She is taking Lantus 26 units daily, Victoza 1.2 daily, advised to increase to 1.8, off metformin because of diarrhea. FSBS at home showed three hypoglycemic episodes down to 44 twice. Advised to go down on Lantus to keep fasting between 90/120. She has not been pre-meal insulin , we will try Starlix to control post-prandial levels. Discussed dietician referral again to improve her diet  HTN: bp is at goal now. No chest pain or palpitation. Taking one pill daily and is doing well.   Hyperlipidemia: taking Crestor as prescribed and not side effects, last labs done in July was at goal   URI: she has had multiple episodes of URI since July. Explained that she may have allergies. She states that over the past weekend she has noticed nasal congestion, very mild post-nasal drainage and facial pressure, mild cough, no chills. She has been taking Mucinex DM and drinking water.   Patient Active Problem List   Diagnosis Date Noted  . Diarrhea 04/03/2016  . Bilateral carotid bruits 08/24/2015  . 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 Atrial flutter ([place]) 02/22/2015  . Chronic pain 01/10/2015  . 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 Fatigue 01/10/2015  . 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 Adiposity 01/10/2015  . 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 Spinal stenosis, lumbar region, 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  . DDD (degenerative disc disease), lumbar 11/14/2014  . Greater trochanteric bursitis of both hips 11/14/2014  . Piriformis syndrome 11/14/2014  . 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 Chest pain 11/06/2010  . Hyperlipemia 11/07/2009  . Hypertension, benign 11/07/2009  . Atherosclerosis of renal artery ([place]) 11/07/2009  . Allergic rhinitis 11/27/2007  . Lumbosacral neuritis 01/17/2007    Past Surgical History:  Procedure Laterality Date  . APPENDECTOMY    . CARDIAC CATHETERIZATION    . cataract surgery    . COLONOSCOPY    . DIAGNOSTIC MAMMOGRAM    . EYE SURGERY Bilateral    Cataract Extraction  . KNEE ARTHROPLASTY Right 03/30/2015   Procedure: COMPUTER ASSISTED TOTAL KNEE ARTHROPLASTY;  Surgeon: Dereck Leep, MD;  Location: [facility] ORS;  Service: Orthopedics;  Laterality: Right;  . KNEE ARTHROSCOPY Right   . KNEE CLOSED REDUCTION Right 05/23/2015   Procedure: CLOSED MANIPULATION KNEE;  Surgeon: Dereck Leep, MD;  Location: [facility] ORS;  Service: Orthopedics;  Laterality: Right;  . TONSILLECTOMY      Family History  Problem Relation Age of Onset  . Diabetes Mother   . Hypertension Mother   . Cancer Mother   . Hypertension Father   .  Cancer Father     Social History   Social History  . Marital status: Married    Spouse name: N/A  . Number of children: N/A  . Years of education: N/A   Occupational History  . Not on file.   Social History Main Topics  . Smoking status: Never Smoker  . Smokeless tobacco: Never Used  . Alcohol use No  . Drug use: No  . Sexual activity: Yes    Partners: Male   Other Topics Concern  . Not on file   Social History Narrative  . No narrative on file     Current Outpatient Prescriptions:  .  ALFALFA PO, Take 3 tablets  by mouth every morning., Disp: , Rfl:  .  aspirin 81 MG EC tablet, Take 81 mg by mouth at bedtime. , Disp: , Rfl:  .  B Complex-C (B-COMPLEX WITH VITAMIN C) tablet, Take 2 tablets by mouth at bedtime. , Disp: , Rfl:  .  calcium carbonate (OS-CAL) 600 MG TABS tablet, Take 600 mg by mouth daily., Disp: , Rfl:  .  cholecalciferol (VITAMIN D) 1000 UNITS tablet, Take 4,000 Units by mouth daily. , Disp: , Rfl:  .  clobetasol ointment (TEMOVATE) AB-123456789 %, Apply 1 application topically 2 (two) times daily as needed (bug bites). Reported on 06/30/2015, Disp: , Rfl:  .  Coenzyme Q10 (COQ10) 200 MG CAPS, Take 1 tablet by mouth daily. 400 mg every am., Disp: , Rfl:  .  estradiol (ESTRACE VAGINAL) 0.1 MG/GM vaginal cream, 1/2 gm once weekly using applicator, apply blueberry sized amount of cream using tip of finger to urethra twice weekly, Disp: 50 g, Rfl: 4 .  Eyelid Cleansers (AVENOVA) 0.01 % SOLN, See admin instructions., Disp: , Rfl: 3 .  fish oil-omega-3 fatty acids 1000 MG capsule, Take 2 g by mouth daily.  , Disp: , Rfl:  .  fluticasone (FLONASE) 50 MCG/ACT nasal spray, Place 2 sprays into both nostrils daily., Disp: 16 g, Rfl: 2 .  GARLIC 99991111 PO, Take 1 tablet by mouth daily. 1 gram, Disp: , Rfl:  .  Glucosamine 500 MG TABS, Take 1 tablet by mouth daily. , Disp: , Rfl:  .  glucose blood (ONE TOUCH ULTRA TEST) test strip, Check four times daily, Disp: 300 each, Rfl: 2 .  Insulin Glargine (LANTUS SOLOSTAR) 100 UNIT/ML Solostar Pen, Inject 24 Units into the skin every morning., Disp: 15 pen, Rfl: 2 .  Insulin Pen Needle 32G X 4 MM MISC, 1 pen by Does not apply route 2 (two) times daily. Use as directed 2 times daily, Disp: 200 each, Rfl: 2 .  Lancets (ONETOUCH ULTRASOFT) lancets, Use as instructed, Disp: 100 each, Rfl: 12 .  LECITHIN PO, Take 1 capsule by mouth every other day., Disp: , Rfl:  .  Liraglutide (VICTOZA) 18 MG/3ML SOPN, Inject 0.3 mLs (1.8 mg total) into the skin every morning., Disp: 9 pen,  Rfl: 1 .  losartan-hydrochlorothiazide (HYZAAR) 100-25 MG tablet, Take 1 tablet by mouth daily., Disp: 90 tablet, Rfl: 1 .  MAGNESIUM-POTASSIUM PO, Take 1 tablet by mouth 2 (two) times daily. Reported on 06/30/2015, Disp: , Rfl:  .  metoprolol succinate (TOPROL XL) 25 MG 24 hr tablet, Take 1 tablet (25 mg total) by mouth daily. (Patient taking differently: Take 25 mg by mouth every morning. ), Disp: 90 tablet, Rfl: 3 .  Multiple Vitamins-Minerals (PRESERVISION AREDS 2) CAPS, Take 1 capsule by mouth 2 (two) times daily., Disp: , Rfl:  .  nateglinide (STARLIX) 60 MG tablet, Take 1 tablet (60 mg total) by mouth 3 (three) times daily with meals., Disp: 90 tablet, Rfl: 2 .  nystatin-triamcinolone ointment (MYCOLOG), Apply 1 application topically 2 (two) times daily., Disp: 30 g, Rfl: 0 .  omeprazole (PRILOSEC) 20 MG capsule, Daily (Patient taking differently: Take 20 mg by mouth 2 (two) times daily before a meal. Daily), Disp: 90 capsule, Rfl: 1 .  Probiotic Product (PROBIOTIC PO), Take 1 Dose by mouth at bedtime., Disp: , Rfl:  .  Propylene Glycol (SYSTANE BALANCE OP), Apply 1 drop to eye as needed., Disp: , Rfl:  .  raloxifene (EVISTA) 60 MG tablet, Take 1 tablet (60 mg total) by mouth every morning., Disp: 90 tablet, Rfl: 3 .  rosuvastatin (CRESTOR) 10 MG tablet, Take 1 tablet (10 mg total) by mouth daily., Disp: 90 tablet, Rfl: 1 .  sodium fluoride (DENTAGEL) 1.1 % GEL dental gel, , Disp: , Rfl:  .  sulfacetamide (BLEPH-10) 10 % ophthalmic solution, Place 1 drop into both eyes 3 (three) times daily as needed., Disp: , Rfl:  .  vitamin E (VITAMIN E) 400 UNIT capsule, Take 400 Units by mouth daily. Every am, Disp: , Rfl:   Allergies  Allergen Reactions  . Cyclobenzaprine Hypertension  . Cheese Other (See Comments)    bloating  . Ciprofloxacin Hcl Other (See Comments)    Muscle pain     ROS  Constitutional: Negative for fever , positive for mild weight change.  Respiratory: Positive  For  mild  cough but no shortness of breath.   Cardiovascular: Negative for chest pain or palpitations.  Gastrointestinal: Positive for diarrhea - slightly better since stopped metformin  Musculoskeletal: Negative for gait problem or joint swelling.  Skin: Negative for rash.  Neurological: Negative for dizziness or headache.  No other specific complaints in a complete review of systems (except as listed in HPI above).  Objective  Vitals:   04/23/16 1031  BP: 138/62  Pulse: 70  Resp: 16  Temp: 97.7 F (36.5 C)  TempSrc: Oral  SpO2: 94%  Weight: 190 lb 7 oz (86.4 kg)  Height: 5\' 4"  (1.626 m)    Body mass index is 32.69 kg/m.  Physical Exam  Constitutional: Patient appears well-developed and well-nourished. Obese  No distress.  HEENT: head atraumatic, normocephalic, pupils equal and reactive to light, neck supple, throat within normal limits Cardiovascular: Normal rate, regular rhythm and normal heart sounds.  No murmur heard. Trace  BLE edema. Pulmonary/Chest: Effort normal and breath sounds normal. No respiratory distress. Abdominal: Soft.  There is no tenderness. Psychiatric: Patient has a normal mood and affect. behavior is normal. Judgment and thought content normal.  Recent Results (from the past 2160 hour(s))  POCT HgB A1C     Status: None   Collection Time: 04/23/16 10:37 AM  Result Value Ref Range   Hemoglobin A1C 9.1   POCT UA - Microalbumin     Status: None   Collection Time: 04/23/16 10:37 AM  Result Value Ref Range   Microalbumin Ur, POC 50 mg/L   Creatinine, POC  mg/dL   Albumin/Creatinine Ratio, Urine, POC       PHQ2/9: Depression screen [facility] 2/9 04/23/2016 03/02/2016 02/16/2016 01/16/2016 07/19/2015  Decreased Interest 0 0 0 0 0  Down, Depressed, Hopeless 0 0 0 0 0  PHQ - 2 Score 0 0 0 0 0    Fall Risk: Fall Risk  04/23/2016 03/02/2016 02/16/2016 01/16/2016 07/19/2015  Falls in the past  year? No No No No No    Functional Status Survey: Is the patient deaf or  have difficulty hearing?: Yes Does the patient have difficulty seeing, even when wearing glasses/contacts?: No Does the patient have difficulty concentrating, remembering, or making decisions?: No Does the patient have difficulty walking or climbing stairs?: Yes Does the patient have difficulty dressing or bathing?: No Does the patient have difficulty doing errands alone such as visiting a doctor's office or shopping?: No    Assessment & Plan  1. 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  Dispense: 15 pen; Refill: 2 - nateglinide (STARLIX) 60 MG tablet; Take 1 tablet (60 mg total) by mouth 3 (three) times daily with meals.  Dispense: 90 tablet; Refill: 2  2. 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(HCC)  Stable on both arms  3. Hypertension, benign  Well controlled  4. Pure hypercholesterolemia  Continue Crestor   5. Viral URI  Continue current regiment  6. Diarrhea, unspecified type  She has colonoscopy scheduled with Dr. Fleet Contras  7. Perennial allergic rhinitis  Advised Loratadine  8. Uncontrolled type 2 diabetes mellitus with microalbuminuria, with long-term current use of insulin ([place])  She is on ARB, explained importance of diabetes control

## 2016-04-26 DIAGNOSIS — M9901 Segmental and somatic dysfunction of cervical region: Secondary | ICD-10-CM | POA: Diagnosis not present

## 2016-04-26 DIAGNOSIS — M5416 Radiculopathy, lumbar region: Secondary | ICD-10-CM | POA: Diagnosis not present

## 2016-04-26 DIAGNOSIS — M9903 Segmental and somatic dysfunction of lumbar region: Secondary | ICD-10-CM | POA: Diagnosis not present

## 2016-04-26 DIAGNOSIS — M5033 Other cervical disc degeneration, cervicothoracic region: Secondary | ICD-10-CM | POA: Diagnosis not present

## 2016-04-27 DIAGNOSIS — L570 Actinic keratosis: Secondary | ICD-10-CM | POA: Diagnosis not present

## 2016-04-27 DIAGNOSIS — D2261 Melanocytic nevi of right upper limb, including shoulder: Secondary | ICD-10-CM | POA: Diagnosis not present

## 2016-04-27 DIAGNOSIS — X32XXXA Exposure to sunlight, initial encounter: Secondary | ICD-10-CM | POA: Diagnosis not present

## 2016-04-27 DIAGNOSIS — D225 Melanocytic nevi of trunk: Secondary | ICD-10-CM | POA: Diagnosis not present

## 2016-04-27 DIAGNOSIS — D2272 Melanocytic nevi of left lower limb, including hip: Secondary | ICD-10-CM | POA: Diagnosis not present

## 2016-04-27 DIAGNOSIS — Z85828 Personal history of other malignant neoplasm of skin: Secondary | ICD-10-CM | POA: Diagnosis not present

## 2016-05-03 ENCOUNTER — Telehealth: Payer: Self-pay | Admitting: *Deleted

## 2016-05-03 DIAGNOSIS — M5033 Other cervical disc degeneration, cervicothoracic region: Secondary | ICD-10-CM | POA: Diagnosis not present

## 2016-05-03 DIAGNOSIS — M9903 Segmental and somatic dysfunction of lumbar region: Secondary | ICD-10-CM | POA: Diagnosis not present

## 2016-05-03 DIAGNOSIS — M9901 Segmental and somatic dysfunction of cervical region: Secondary | ICD-10-CM | POA: Diagnosis not present

## 2016-05-03 DIAGNOSIS — M5416 Radiculopathy, lumbar region: Secondary | ICD-10-CM | POA: Diagnosis not present

## 2016-05-03 NOTE — Telephone Encounter (Signed)
Patient called back and states that she has had no changes yet with her medications. She is still not taking the Metformin. She will be starting a new diabetic medication but not until after her Colonoscopy. She will call us back with the name. She is aware of her instructions and does have her Miralax prescription.

## 2016-05-03 NOTE — Telephone Encounter (Signed)
Message left on cell number for patient to call the office.   We need to make sure patient has not had a change in medications since last office visit. Also, need to make sure patient has picked up Miralax prescription.   Patient is currently scheduled for a colonoscopy on 05-09-16 at [facility].

## 2016-05-04 NOTE — Telephone Encounter (Signed)
Patient called back and said her new medication is Nateglinide 60 mg one PO BID 30 minutes prior to meals. She will not be starting this medication until after her Colonoscopy is done.

## 2016-05-08 ENCOUNTER — Ambulatory Visit
Admission: RE | Admit: 2016-05-08 | Discharge: 2016-05-08 | Disposition: A | Discharge status: Home / Self Care | Payer: Medicare Other | Source: Ambulatory Visit | Attending: Family Medicine | Admitting: Family Medicine

## 2016-05-08 DIAGNOSIS — Z1231 Encounter for screening mammogram for malignant neoplasm of breast: Secondary | ICD-10-CM | POA: Diagnosis not present

## 2016-05-09 ENCOUNTER — Ambulatory Visit: Payer: Medicare Other | Admitting: Anesthesiology

## 2016-05-09 ENCOUNTER — Encounter: Payer: Self-pay | Admitting: *Deleted

## 2016-05-09 ENCOUNTER — Encounter
Admission: RE | Disposition: A | Discharge status: Home / Self Care | Payer: Self-pay | Source: Ambulatory Visit | Attending: General Surgery

## 2016-05-09 ENCOUNTER — Ambulatory Visit
Admission: RE | Admit: 2016-05-09 | Discharge: 2016-05-09 | Disposition: A | Discharge status: Home / Self Care | Payer: Medicare Other | Source: Ambulatory Visit | Attending: General Surgery | Admitting: General Surgery

## 2016-05-09 DIAGNOSIS — Z79899 Other long term (current) drug therapy: Secondary | ICD-10-CM | POA: Insufficient documentation

## 2016-05-09 DIAGNOSIS — K521 Toxic gastroenteritis and colitis: Secondary | ICD-10-CM | POA: Diagnosis not present

## 2016-05-09 DIAGNOSIS — Z794 Long term (current) use of insulin: Secondary | ICD-10-CM | POA: Diagnosis not present

## 2016-05-09 DIAGNOSIS — Z6832 Body mass index (BMI) 32.0-32.9, adult: Secondary | ICD-10-CM | POA: Insufficient documentation

## 2016-05-09 DIAGNOSIS — M1990 Unspecified osteoarthritis, unspecified site: Secondary | ICD-10-CM | POA: Diagnosis not present

## 2016-05-09 DIAGNOSIS — E785 Hyperlipidemia, unspecified: Secondary | ICD-10-CM | POA: Insufficient documentation

## 2016-05-09 DIAGNOSIS — I701 Atherosclerosis of renal artery: Secondary | ICD-10-CM | POA: Diagnosis not present

## 2016-05-09 DIAGNOSIS — R197 Diarrhea, unspecified: Secondary | ICD-10-CM | POA: Insufficient documentation

## 2016-05-09 DIAGNOSIS — I10 Essential (primary) hypertension: Secondary | ICD-10-CM | POA: Diagnosis not present

## 2016-05-09 DIAGNOSIS — E119 Type 2 diabetes mellitus without complications: Secondary | ICD-10-CM | POA: Diagnosis not present

## 2016-05-09 DIAGNOSIS — K219 Gastro-esophageal reflux disease without esophagitis: Secondary | ICD-10-CM | POA: Diagnosis not present

## 2016-05-09 HISTORY — PX: COLONOSCOPY WITH PROPOFOL: SHX5780

## 2016-05-09 SURGERY — COLONOSCOPY WITH PROPOFOL
Anesthesia: General

## 2016-05-09 MED ORDER — PROPOFOL 500 MG/50ML IV EMUL
INTRAVENOUS | Status: DC | PRN
  Administered 2016-05-09: 150 ug/kg/min via INTRAVENOUS

## 2016-05-09 MED ORDER — SODIUM CHLORIDE 0.9 % IV SOLN
INTRAVENOUS | Status: DC
  Administered 2016-05-09: 08:00:00 via INTRAVENOUS

## 2016-05-09 MED ORDER — PROPOFOL 10 MG/ML IV BOLUS
INTRAVENOUS | Status: DC | PRN
  Administered 2016-05-09: 50 mg via INTRAVENOUS

## 2016-05-09 MED ORDER — MIDAZOLAM HCL 2 MG/2ML IJ SOLN
INTRAMUSCULAR | Status: DC | PRN
  Administered 2016-05-09: 1 mg via INTRAVENOUS

## 2016-05-09 MED ORDER — FENTANYL CITRATE (PF) 100 MCG/2ML IJ SOLN
INTRAMUSCULAR | Status: DC | PRN
  Administered 2016-05-09: 50 ug via INTRAVENOUS

## 2016-05-09 MED ORDER — AMOXICILLIN 500 MG PO CAPS
2000.0000 mg | ORAL_CAPSULE | Freq: Once | ORAL | Status: AC
  Administered 2016-05-09: 2000 mg via ORAL
  Filled 2016-05-09: qty 4

## 2016-05-09 MED ORDER — LIDOCAINE HCL (CARDIAC) 20 MG/ML IV SOLN
INTRAVENOUS | Status: DC | PRN
  Administered 2016-05-09: 40 mg via INTRAVENOUS

## 2016-05-09 NOTE — Op Note (Signed)
[facility] Gastroenterology Patient Name: Bonnie Rowland Procedure Date: 05/09/2016 7:38 AM MRN: [id number] Account #: [id number] Date of Birth: 28-Mar-1941 Admit Type: Outpatient Age: 75 Room: [facility] ENDO ROOM 3 Gender: Female Note Status: Finalized Procedure:            Colonoscopy Indications:          Clinically significant diarrhea of unexplained origin Providers:            Robert Bellow, MD Referring MD:         Ashok Norris, MD (Referring MD) Medicines:            Monitored Anesthesia Care Complications:        No immediate complications. Procedure:            Pre-Anesthesia Assessment:                       - Prior to the procedure, a History and Physical was                        performed, and patient medications, allergies and                        sensitivities were reviewed. The patient's tolerance of                        previous anesthesia was reviewed.                       - The risks and benefits of the procedure and the                        sedation options and risks were discussed with the                        patient. All questions were answered and informed                        consent was obtained.                       After obtaining informed consent, the colonoscope was                        passed under direct vision. Throughout the procedure,                        the patient's blood pressure, pulse, and oxygen                        saturations were monitored continuously. The                        Colonoscope was introduced through the anus and                        advanced to the the cecum, identified by appendiceal                        orifice and ileocecal valve. The colonoscopy was  performed without difficulty. The patient tolerated the                        procedure well. The quality of the bowel preparation                        was excellent. Findings:      The entire  examined colon appeared normal on direct and retroflexion       views.      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 Impression:           - The entire examined colon is normal on direct and                        retroflexion views.                       - No specimens collected. Recommendation:       - Telephone endoscopist for pathology results in 1 week. Procedure Code(s):    --- Professional ---                       [id number], Colonoscopy, flexible; diagnostic, including                        collection of specimen(s) by brushing or washing, when                        performed (separate procedure) Diagnosis Code(s):    --- Professional ---                       R19.7, Diarrhea, unspecified CPT copyright 2016 American Medical Association. All rights reserved. The codes documented in this report are preliminary and upon coder review may  be revised to meet current compliance requirements. Robert Bellow, MD 05/09/2016 8:03:00 AM This report has been signed electronically. Number of Addenda: 0 Note Initiated On: 05/09/2016 7:38 AM Scope Withdrawal Time: 0 hours 10 minutes 55 seconds  Total Procedure Duration: 0 hours 15 minutes 37 seconds       [facility]

## 2016-05-09 NOTE — H&P (Signed)
No change in clinical history or exam.. Still with multiple AM bowel movements without blood or mucous.  For colonoscopy.

## 2016-05-09 NOTE — Transfer of Care (Signed)
Immediate Anesthesia Transfer of Care Note  Patient: Bonnie Rowland  Procedure(s) Performed: Procedure(s): COLONOSCOPY WITH PROPOFOL (N/A)  Patient Location: PACU  Anesthesia Type:General  Level of Consciousness: sedated  Airway & Oxygen Therapy: Patient Spontanous Breathing and Patient connected to face mask oxygen  Post-op Assessment: Report given to RN and Post -op Vital signs reviewed and stable  Post vital signs: Reviewed and stable  Last Vitals:  Vitals:   05/09/16 0656 05/09/16 0805  BP: (!) 135/54 (!) 118/59  Pulse: 65   Resp: 16 18  Temp: 36.6 C (!) 99991111 C    Complications: No apparent anesthesia complications

## 2016-05-09 NOTE — Anesthesia Preprocedure Evaluation (Signed)
Anesthesia Evaluation  Patient identified by MRN, date of birth, ID band Patient awake    Reviewed: Allergy & Precautions, NPO status , Patient's Chart, lab work & pertinent test results, reviewed documented beta blocker date and time   History of Anesthesia Complications Negative for: history of anesthetic complications  Airway Mallampati: II  TM Distance: >3 FB Neck ROM: Full    Dental no notable dental hx.    Pulmonary neg pulmonary ROS, neg sleep apnea, neg COPD,    breath sounds clear to auscultation- rhonchi (-) wheezing      Cardiovascular Exercise Tolerance: Good hypertension, Pt. on medications and Pt. on home beta blockers (-) CAD and (-) Past MI  Rhythm:Regular Rate:Normal - Systolic murmurs and - Diastolic murmurs    Neuro/Psych negative psych ROS   GI/Hepatic Neg liver ROS, GERD  ,  Endo/Other  diabetes, Insulin Dependent  Renal/GU Renal disease: RAS.     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; left shoulder blade 05/2014: Cancer ([place])     Comment: basal cell removed left temple No date: Cellulitis and abscess of leg, except foot No date: Conjunctivitis unspecified No date: Dermatophytosis of nail No date: Diabetes mellitus     Comment: type II No date: Esophageal reflux No date: Hyperlipidemia No date: Hypertension No date: 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) No date: Renal artery stenosis (HCC) No date: Sprain of lumbar region No date: Thoracic or lumbosacral neuritis or radiculiti* No date: Urinary tract infection, site not specified   Reproductive/Obstetrics                             Anesthesia Physical Anesthesia Plan  ASA: III  Anesthesia Plan: General   Post-op Pain Management:    Induction: Intravenous  Airway Management Planned: Natural Airway  Additional Equipment:   Intra-op Plan:   Post-operative Plan:   Informed Consent: I have reviewed the patients History and Physical, chart, labs and discussed the procedure including the risks, benefits and alternatives for the proposed anesthesia with the patient or authorized representative who has indicated his/her understanding and acceptance.   Dental advisory given  Plan Discussed with: CRNA and Anesthesiologist  Anesthesia Plan Comments:         Anesthesia Quick Evaluation

## 2016-05-09 NOTE — Anesthesia Procedure Notes (Signed)
Date/Time: 05/09/2016 7:38 AM Performed by: Doreen Salvage Pre-anesthesia Checklist: Patient identified, Emergency Drugs available, Suction available and Patient being monitored Patient Re-evaluated:Patient Re-evaluated prior to inductionOxygen Delivery Method: Nasal cannula Intubation Type: IV induction Dental Injury: Teeth and Oropharynx as per pre-operative assessment  Comments: Nasal cannula with etCO2 monitoring

## 2016-05-09 NOTE — Anesthesia Postprocedure Evaluation (Signed)
Anesthesia Post Note  Patient: Bonnie Rowland  Procedure(s) Performed: Procedure(s) (LRB): COLONOSCOPY WITH PROPOFOL (N/A)  Patient location during evaluation: PACU Anesthesia Type: General Level of consciousness: awake and alert and oriented Pain management: pain level controlled Vital Signs Assessment: post-procedure vital signs reviewed and stable Respiratory status: spontaneous breathing, nonlabored ventilation and respiratory function stable Cardiovascular status: blood pressure returned to baseline and stable Postop Assessment: no signs of nausea or vomiting Anesthetic complications: no    Last Vitals:  Vitals:   05/09/16 0656 05/09/16 0805  BP: (!) 135/54 (!) 118/59  Pulse: 65 60  Resp: 16 16  Temp: 36.6 C 36.6 C    Last Pain:  Vitals:   05/09/16 0835  TempSrc:   PainSc: 0-No pain                 [name]

## 2016-05-10 ENCOUNTER — Encounter: Payer: Self-pay | Admitting: General Surgery

## 2016-05-10 DIAGNOSIS — M9903 Segmental and somatic dysfunction of lumbar region: Secondary | ICD-10-CM | POA: Diagnosis not present

## 2016-05-10 DIAGNOSIS — M9901 Segmental and somatic dysfunction of cervical region: Secondary | ICD-10-CM | POA: Diagnosis not present

## 2016-05-10 DIAGNOSIS — M5033 Other cervical disc degeneration, cervicothoracic region: Secondary | ICD-10-CM | POA: Diagnosis not present

## 2016-05-10 DIAGNOSIS — M5416 Radiculopathy, lumbar region: Secondary | ICD-10-CM | POA: Diagnosis not present

## 2016-05-10 LAB — SURGICAL PATHOLOGY

## 2016-05-13 ENCOUNTER — Other Ambulatory Visit: Payer: Self-pay | Admitting: Cardiovascular Disease

## 2016-05-16 ENCOUNTER — Telehealth: Payer: Self-pay

## 2016-05-16 NOTE — Telephone Encounter (Signed)
Notified patient as instructed, patient pleased. Discussed follow-up appointments, patient agrees  

## 2016-05-16 NOTE — Telephone Encounter (Signed)
-----   Message from Robert Bellow, MD sent at 05/15/2016  8:53 PM EST ----- Please notify the patient that her biopsies showed no evidence of colitis. The exam was otherwise normal as well. Diarrhea likely still related to prior metformin therapy. She may want to consider adding fiber to decrease stool frequency, but this is not mandatory. Follow-up exam in 10 years. ----- Message ----- From: Interface, Lab In Three Zero One Sent: 05/10/2016   6:08 PM To: Robert Bellow, MD

## 2016-05-17 DIAGNOSIS — M5416 Radiculopathy, lumbar region: Secondary | ICD-10-CM | POA: Diagnosis not present

## 2016-05-17 DIAGNOSIS — M5033 Other cervical disc degeneration, cervicothoracic region: Secondary | ICD-10-CM | POA: Diagnosis not present

## 2016-05-17 DIAGNOSIS — M9901 Segmental and somatic dysfunction of cervical region: Secondary | ICD-10-CM | POA: Diagnosis not present

## 2016-05-17 DIAGNOSIS — M9903 Segmental and somatic dysfunction of lumbar region: Secondary | ICD-10-CM | POA: Diagnosis not present

## 2016-05-17 DIAGNOSIS — Z96651 Presence of right artificial knee joint: Secondary | ICD-10-CM | POA: Diagnosis not present

## 2016-05-22 DIAGNOSIS — M5033 Other cervical disc degeneration, cervicothoracic region: Secondary | ICD-10-CM | POA: Diagnosis not present

## 2016-05-22 DIAGNOSIS — M5416 Radiculopathy, lumbar region: Secondary | ICD-10-CM | POA: Diagnosis not present

## 2016-05-22 DIAGNOSIS — M9903 Segmental and somatic dysfunction of lumbar region: Secondary | ICD-10-CM | POA: Diagnosis not present

## 2016-05-22 DIAGNOSIS — M9901 Segmental and somatic dysfunction of cervical region: Secondary | ICD-10-CM | POA: Diagnosis not present

## 2016-05-31 DIAGNOSIS — M9901 Segmental and somatic dysfunction of cervical region: Secondary | ICD-10-CM | POA: Diagnosis not present

## 2016-05-31 DIAGNOSIS — M5033 Other cervical disc degeneration, cervicothoracic region: Secondary | ICD-10-CM | POA: Diagnosis not present

## 2016-05-31 DIAGNOSIS — M9903 Segmental and somatic dysfunction of lumbar region: Secondary | ICD-10-CM | POA: Diagnosis not present

## 2016-05-31 DIAGNOSIS — M5416 Radiculopathy, lumbar region: Secondary | ICD-10-CM | POA: Diagnosis not present

## 2016-06-07 DIAGNOSIS — M9901 Segmental and somatic dysfunction of cervical region: Secondary | ICD-10-CM | POA: Diagnosis not present

## 2016-06-07 DIAGNOSIS — M5033 Other cervical disc degeneration, cervicothoracic region: Secondary | ICD-10-CM | POA: Diagnosis not present

## 2016-06-07 DIAGNOSIS — M5416 Radiculopathy, lumbar region: Secondary | ICD-10-CM | POA: Diagnosis not present

## 2016-06-07 DIAGNOSIS — M9903 Segmental and somatic dysfunction of lumbar region: Secondary | ICD-10-CM | POA: Diagnosis not present

## 2016-06-14 DIAGNOSIS — M5416 Radiculopathy, lumbar region: Secondary | ICD-10-CM | POA: Diagnosis not present

## 2016-06-14 DIAGNOSIS — M9903 Segmental and somatic dysfunction of lumbar region: Secondary | ICD-10-CM | POA: Diagnosis not present

## 2016-06-14 DIAGNOSIS — M9901 Segmental and somatic dysfunction of cervical region: Secondary | ICD-10-CM | POA: Diagnosis not present

## 2016-06-14 DIAGNOSIS — M5033 Other cervical disc degeneration, cervicothoracic region: Secondary | ICD-10-CM | POA: Diagnosis not present

## 2016-06-18 ENCOUNTER — Encounter: Payer: Self-pay | Admitting: Family Medicine

## 2016-06-18 ENCOUNTER — Ambulatory Visit (INDEPENDENT_AMBULATORY_CARE_PROVIDER_SITE_OTHER): Payer: Medicare Other | Admitting: Family Medicine

## 2016-06-18 VITALS — BP 124/64 | HR 67 | Temp 99.1°F | Resp 16 | Ht 64.0 in | Wt 195.5 lb

## 2016-06-18 DIAGNOSIS — E1165 Type 2 diabetes mellitus with hyperglycemia: Secondary | ICD-10-CM

## 2016-06-18 DIAGNOSIS — E113299 Type 2 diabetes mellitus with mild nonproliferative diabetic retinopathy without macular edema, unspecified eye: Secondary | ICD-10-CM | POA: Diagnosis not present

## 2016-06-18 DIAGNOSIS — I6529 Occlusion and stenosis of unspecified carotid artery: Secondary | ICD-10-CM | POA: Diagnosis not present

## 2016-06-18 NOTE — Progress Notes (Signed)
Name: Bonnie Rowland   MRN: [id number]    DOB: Nov 23, 1940   Date:06/18/2016       Progress Note  Subjective  Chief Complaint  Chief Complaint  Patient presents with  . Diabetes    8 week follow up pt brought in her  diabetic log    HPI  DMII: uncontrolled and with diabetic retinopathy, eye exam is up to date. Her hgbA1C has gone up from 6.3% back in 05/2015  to over 9  In June 2017 and still  at 9.1 % 04/2016. Since last visit she has been more compliant with a diabetic diet. She is taking Lantus 24-6 units daily, Victoza 1.2 not daily because of cost, and did not go up to 1.8 because of cost ( we will give her a sample today), she ioff metformin because of diarrhea, we added Starlix to take before meals, she also has some Humalog to use only when glucose is above 200 and only in am's before breakfast if needed. She had one episode of hypoglycemia since last visit down to 60's in am's, a few times fasting above 200. She is not checking post-prandial levels. She still has polyphagia, denies polydipsia but she has urinary frequency.    Patient Active Problem List   Diagnosis Date Noted  . Diarrhea 04/03/2016  . Bilateral carotid bruits 08/24/2015  . 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 Atrial flutter ([place]) 02/22/2015  . Chronic pain 01/10/2015  . 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 Fatigue 01/10/2015  . 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 Adiposity 01/10/2015  . 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 Spinal stenosis, lumbar region, 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  . DDD (degenerative disc disease), lumbar 11/14/2014  . Greater trochanteric bursitis of both hips 11/14/2014  . Piriformis syndrome 11/14/2014  . 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 Hyperlipemia 11/07/2009  . Hypertension, benign 11/07/2009  . Atherosclerosis of renal artery ([place]) 11/07/2009   . Perennial allergic rhinitis 11/27/2007  . Lumbosacral neuritis 01/17/2007    Past Surgical History:  Procedure Laterality Date  . APPENDECTOMY    . CARDIAC CATHETERIZATION    . cataract surgery    . COLONOSCOPY    . COLONOSCOPY WITH PROPOFOL N/A 05/09/2016   Procedure: COLONOSCOPY WITH PROPOFOL;  Surgeon: Robert Bellow, MD;  Location: [facility] ENDOSCOPY;  Service: Endoscopy;  Laterality: N/A;  . DIAGNOSTIC MAMMOGRAM    . EYE SURGERY Bilateral    Cataract Extraction  . KNEE ARTHROPLASTY Right 03/30/2015   Procedure: COMPUTER ASSISTED TOTAL KNEE ARTHROPLASTY;  Surgeon: Dereck Leep, MD;  Location: [facility] ORS;  Service: Orthopedics;  Laterality: Right;  . KNEE ARTHROSCOPY Right   . KNEE CLOSED REDUCTION Right 05/23/2015   Procedure: CLOSED MANIPULATION KNEE;  Surgeon: Dereck Leep, MD;  Location: [facility] ORS;  Service: Orthopedics;  Laterality: Right;  . TONSILLECTOMY      Family History  Problem Relation Age of Onset  . Diabetes Mother   . Hypertension Mother   . Cancer Mother   . Hypertension Father   . Cancer Father   . Breast cancer Maternal Aunt 45    Social History   Social History  . Marital status: Married    Spouse name: N/A  . Number of children: N/A  . Years of education: N/A   Occupational History  . Not on file.   Social History Main Topics  . Smoking  status: Never Smoker  . Smokeless tobacco: Never Used  . Alcohol use No  . Drug use: No  . Sexual activity: Yes    Partners: Male   Other Topics Concern  . Not on file   Social History Narrative  . No narrative on file     Current Outpatient Prescriptions:  .  ALFALFA PO, Take 3 tablets by mouth every morning., Disp: , Rfl:  .  aspirin 81 MG EC tablet, Take 81 mg by mouth at bedtime. , Disp: , Rfl:  .  B Complex-C (B-COMPLEX WITH VITAMIN C) tablet, Take 2 tablets by mouth at bedtime. , Disp: , Rfl:  .  calcium carbonate (OS-CAL) 600 MG TABS tablet, Take 600 mg by mouth daily., Disp: , Rfl:  .   cholecalciferol (VITAMIN D) 1000 UNITS tablet, Take 4,000 Units by mouth daily. , Disp: , Rfl:  .  clobetasol ointment (TEMOVATE) [id number] %, Apply 1 application topically 2 (two) times daily as needed (bug bites). Reported on 06/30/2015, Disp: , Rfl:  .  Coenzyme Q10 (COQ10) 200 MG CAPS, Take 1 tablet by mouth daily. 400 mg every am., Disp: , Rfl:  .  estradiol (ESTRACE VAGINAL) 0.1 MG/GM vaginal cream, 1/2 gm once weekly using applicator, apply blueberry sized amount of cream using tip of finger to urethra twice weekly, Disp: 50 g, Rfl: 4 .  Eyelid Cleansers (AVENOVA) 0.01 % SOLN, See admin instructions., Disp: , Rfl: 3 .  fish oil-omega-3 fatty acids 1000 MG capsule, Take 2 g by mouth daily.  , Disp: , Rfl:  .  fluticasone (FLONASE) 50 MCG/ACT nasal spray, Place 2 sprays into both nostrils daily., Disp: 16 g, Rfl: 2 .  GARLIC 99991111 PO, Take 1 tablet by mouth daily. 1 gram, Disp: , Rfl:  .  Glucosamine 500 MG TABS, Take 1 tablet by mouth daily. , Disp: , Rfl:  .  glucose blood (ONE TOUCH ULTRA TEST) test strip, Check four times daily, Disp: 300 each, Rfl: 2 .  Insulin Glargine (LANTUS SOLOSTAR) 100 UNIT/ML Solostar Pen, Inject 24 Units into the skin every morning., Disp: 15 pen, Rfl: 2 .  Insulin Pen Needle 32G X 4 MM MISC, 1 pen by Does not apply route 2 (two) times daily. Use as directed 2 times daily, Disp: 200 each, Rfl: 2 .  Lancets (ONETOUCH ULTRASOFT) lancets, Use as instructed, Disp: 100 each, Rfl: 12 .  LECITHIN PO, Take 1 capsule by mouth every other day., Disp: , Rfl:  .  Liraglutide (VICTOZA) 18 MG/3ML SOPN, Inject 0.3 mLs (1.8 mg total) into the skin every morning., Disp: 9 pen, Rfl: 1 .  loratadine (CLARITIN) 10 MG tablet, Take 1 tablet (10 mg total) by mouth daily., Disp: 30 tablet, Rfl: 11 .  losartan-hydrochlorothiazide (HYZAAR) 100-25 MG tablet, Take 1 tablet by mouth daily., Disp: 90 tablet, Rfl: 1 .  MAGNESIUM-POTASSIUM PO, Take 1 tablet by mouth 2 (two) times daily. Reported on  06/30/2015, Disp: , Rfl:  .  metoprolol succinate (TOPROL-XL) 25 MG 24 hr tablet, TAKE 1 TABLET (25 MG TOTAL) BY MOUTH DAILY., Disp: 90 tablet, Rfl: 3 .  Multiple Vitamins-Minerals (PRESERVISION AREDS 2) CAPS, Take 1 capsule by mouth 2 (two) times daily., Disp: , Rfl:  .  nateglinide (STARLIX) 60 MG tablet, Take 1 tablet (60 mg total) by mouth 3 (three) times daily with meals., Disp: 90 tablet, Rfl: 2 .  nystatin-triamcinolone ointment (MYCOLOG), Apply 1 application topically 2 (two) times daily., Disp: 30 g, Rfl: 0 .  Probiotic Product (PROBIOTIC PO), Take 1 Dose by mouth at bedtime., Disp: , Rfl:  .  Propylene Glycol (SYSTANE BALANCE OP), Apply 1 drop to eye as needed., Disp: , Rfl:  .  raloxifene (EVISTA) 60 MG tablet, Take 1 tablet (60 mg total) by mouth every morning., Disp: 90 tablet, Rfl: 3 .  rosuvastatin (CRESTOR) 10 MG tablet, Take 1 tablet (10 mg total) by mouth daily., Disp: 90 tablet, Rfl: 1 .  sodium fluoride (DENTAGEL) 1.1 % GEL dental gel, , Disp: , Rfl:  .  sulfacetamide (BLEPH-10) 10 % ophthalmic solution, Place 1 drop into both eyes 3 (three) times daily as needed., Disp: , Rfl:  .  vitamin E (VITAMIN E) 400 UNIT capsule, Take 400 Units by mouth daily. Every am, Disp: , Rfl:   Allergies  Allergen Reactions  . Cyclobenzaprine Hypertension  . Cheese Other (See Comments)    bloating  . Ciprofloxacin Hcl Other (See Comments)    Muscle pain     ROS  Constitutional: Negative for fever or positive for  weight change.  Respiratory: Negative for cough and shortness of breath.   Cardiovascular: Negative for chest pain or palpitations.  Gastrointestinal: Negative for abdominal pain, no bowel changes.  Musculoskeletal: Positive  for gait problem , positive for joint swelling.  Skin: Negative for rash.  Neurological: Negative for dizziness or headache.  No other specific complaints in a complete review of systems (except as listed in HPI above).  Objective  Vitals:    06/18/16 1201  BP: 124/64  Pulse: 67  Resp: 16  Temp: 99.1 F (37.3 C)  TempSrc: Oral  SpO2: 94%  Weight: 195 lb 8 oz (88.7 kg)  Height: 5\' 4"  (1.626 m)    Body mass index is 33.56 kg/m.  Physical Exam  Constitutional: Patient appears well-developed and well-nourished. Obese No distress.  HEENT: head atraumatic, normocephalic, pupils equal and reactive to light, neck supple, throat within normal limits Cardiovascular: Normal rate, regular rhythm and normal heart sounds.  No murmur heard. No BLE edema. Pulmonary/Chest: Effort normal and breath sounds normal. No respiratory distress. Abdominal: Soft.  There is no tenderness. Psychiatric: Patient has a normal mood and affect. behavior is normal. Judgment and thought content normal.  Recent Results (from the past 2160 hour(s))  POCT HgB A1C     Status: None   Collection Time: 04/23/16 10:37 AM  Result Value Ref Range   Hemoglobin A1C 9.1   POCT UA - Microalbumin     Status: None   Collection Time: 04/23/16 10:37 AM  Result Value Ref Range   Microalbumin Ur, POC 50 mg/L   Creatinine, POC  mg/dL   Albumin/Creatinine Ratio, Urine, POC    Surgical pathology     Status: None   Collection Time: 05/09/16  7:52 AM  Result Value Ref Range   SURGICAL PATHOLOGY      Surgical Pathology CASE: [id number] PATIENT: [facility] Surgical Pathology Report     SPECIMEN SUBMITTED: A. Colon, random right; cbx B. Colon, random left; cbx  CLINICAL HISTORY: None provided  PRE-OPERATIVE DIAGNOSIS: Diarrhea  POST-OPERATIVE DIAGNOSIS: Normal     DIAGNOSIS: A. RIGHT COLON; RANDOM COLD BIOPSIES: - NEGATIVE FOR COLITIS.  B. LEFT COLON; RANDOM COLD BIOPSIES: - NEGATIVE FOR COLITIS.  Comment: In parts A and B, the crypt architecture is intact and there is no active inflammation. There is no subepithelial collagen deposition or intraepithelial lymphocytosis. No viral cytopathic effects or granulomas are  identified.   GROSS DESCRIPTION: A.  Labeled: C BX random right colon Tissue fragment(s): 3 Size: 0.3-0.4 cm Description: pink-tan fragments  Entirely submitted in 1 cassette(s).   B. Labeled: C BX random left colon Tissue fragment(s): 3 Size: 0.3-0.5 cm Description: pink-tan fragments  Entirely submitted in 1 ca ssette(s).  Final Diagnosis performed by Bryan Lemma, MD.  Electronically signed 05/10/2016 5:53:28PM    The electronic signature indicates that the named Attending Pathologist has evaluated the specimen  Technical component performed at [facility], [address], [place], [place] [address] Lab: [phone] Dir: Darrick Penna. Evette Doffing, MD  Professional component performed at [facility], [facility], [place], [place], [place] [address] Lab: [phone] Dir: Dellia Nims. Rubinas, MD        PHQ2/9: Depression screen [facility] 2/9 06/18/2016 04/23/2016 03/02/2016 02/16/2016 01/16/2016  Decreased Interest 0 0 0 0 0  Down, Depressed, Hopeless 0 0 0 0 0  PHQ - 2 Score 0 0 0 0 0     Fall Risk: Fall Risk  06/18/2016 04/23/2016 03/02/2016 02/16/2016 01/16/2016  Falls in the past year? No No No No No    Functional Status Survey: Is the patient deaf or have difficulty hearing?: Yes Does the patient have difficulty seeing, even when wearing glasses/contacts?: No Does the patient have difficulty concentrating, remembering, or making decisions?: No Does the patient have difficulty walking or climbing stairs?: Yes Does the patient have difficulty dressing or bathing?: No Does the patient have difficulty doing errands alone such as visiting a doctor's office or shopping?: No   Assessment & Plan  1. 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

## 2016-06-20 ENCOUNTER — Ambulatory Visit (INDEPENDENT_AMBULATORY_CARE_PROVIDER_SITE_OTHER): Payer: Medicare Other | Admitting: Urology

## 2016-06-20 ENCOUNTER — Encounter: Payer: Self-pay | Admitting: Urology

## 2016-06-20 VITALS — BP 138/64 | HR 59 | Ht 64.0 in | Wt 196.3 lb

## 2016-06-20 DIAGNOSIS — N952 Postmenopausal atrophic vaginitis: Secondary | ICD-10-CM

## 2016-06-20 DIAGNOSIS — R300 Dysuria: Secondary | ICD-10-CM | POA: Diagnosis not present

## 2016-06-20 DIAGNOSIS — I6529 Occlusion and stenosis of unspecified carotid artery: Secondary | ICD-10-CM | POA: Diagnosis not present

## 2016-06-20 DIAGNOSIS — N3941 Urge incontinence: Secondary | ICD-10-CM

## 2016-06-20 LAB — BLADDER SCAN AMB NON-IMAGING: SCAN RESULT: 0

## 2016-06-20 MED ORDER — NYSTATIN-TRIAMCINOLONE 100000-0.1 UNIT/GM-% EX OINT: 1.0000 "application " | TOPICAL_OINTMENT | Freq: Two times a day (BID) | CUTANEOUS | 3 refills | Status: DC

## 2016-06-20 MED ORDER — ESTRADIOL 0.1 MG/GM VA CREA: TOPICAL_CREAM | VAGINAL | 4 refills | Status: DC

## 2016-06-20 NOTE — Progress Notes (Signed)
06/20/2016 2:03 PM   Bonnie Rowland 1941-05-21 [id number]  Referring provider: Ashok Norris, MD No address on file  Chief Complaint  Patient presents with  . Dysuria    1 year follow up    HPI: Patient is a 75 year old Caucasian female presenting today for her annual follow-up visit for dysuria and vaginal atrophy.   Today, she states that she is somewhat bothered by night time urination and waking up at night to urinate.  She is bothered a little bit by the sudden urge to urinate with little or no warning and accidental loss of small amounts of urine.  She has not had any UTI's since we had last seen her in the office.  Her PVR is 0 mL.  She is trying to drink "loads of water."  She is not experiencing dysuria, gross hematuria or suprapubic pain.  She is not having recent fevers, chills, nausea or vomiting.   She is using vaginal estrogen cream twice weekly.      PMH: Past Medical History:  Diagnosis Date  . Acute cystitis   . Allergic rhinitis, cause unspecified   . Atherosclerosis of renal artery ([place])    left  . Bronchitis, not specified as acute or chronic   . Cancer ([place]) 12/2013   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; left shoulder blade  . Cancer ([place]) 05/2014   basal cell removed left temple  . Cellulitis and abscess of leg, except foot   . Conjunctivitis unspecified   . Dermatophytosis of nail   . Diabetes mellitus    type II  . Esophageal reflux   . Hyperlipidemia   . Hypertension   . 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 Renal artery stenosis ([place])   . Sprain of lumbar region   . Thoracic or lumbosacral neuritis or radiculitis, unspecified   . Urinary tract infection, site not specified     Surgical History: Past Surgical History:  Procedure Laterality Date  . APPENDECTOMY    . CARDIAC CATHETERIZATION    . cataract surgery    . COLONOSCOPY    . COLONOSCOPY WITH PROPOFOL N/A 05/09/2016   Procedure: COLONOSCOPY WITH PROPOFOL;  Surgeon: Robert Bellow, MD;   Location: [facility] ENDOSCOPY;  Service: Endoscopy;  Laterality: N/A;  . DIAGNOSTIC MAMMOGRAM    . EYE SURGERY Bilateral    Cataract Extraction  . KNEE ARTHROPLASTY Right 03/30/2015   Procedure: COMPUTER ASSISTED TOTAL KNEE ARTHROPLASTY;  Surgeon: Dereck Leep, MD;  Location: [facility] ORS;  Service: Orthopedics;  Laterality: Right;  . KNEE ARTHROSCOPY Right   . KNEE CLOSED REDUCTION Right 05/23/2015   Procedure: CLOSED MANIPULATION KNEE;  Surgeon: Dereck Leep, MD;  Location: [facility] ORS;  Service: Orthopedics;  Laterality: Right;  . TONSILLECTOMY      Home Medications:  Allergies as of 06/20/2016      Reactions   Cyclobenzaprine Hypertension   Cheese Other (See Comments)   bloating   Ciprofloxacin Hcl Other (See Comments)   Muscle pain   Coconut Oil       Medication List       Accurate as of 06/20/16  2:03 PM. Always use your most recent med list.          ALFALFA PO Take 3 tablets by mouth every morning.   aspirin 81 MG EC tablet Take 81 mg by mouth at bedtime.   AVENOVA 0.01 % Soln See admin instructions.   B-complex with vitamin C tablet Take 2 tablets by mouth at bedtime.  calcium carbonate 600 MG Tabs tablet Commonly known as:  OS-CAL Take 600 mg by mouth daily.   cholecalciferol 1000 units tablet Commonly known as:  VITAMIN D Take 4,000 Units by mouth daily.   clobetasol ointment 0.05 % Commonly known as:  TEMOVATE Apply 1 application topically 2 (two) times daily as needed (bug bites). Reported on 06/30/2015   CoQ10 200 MG Caps Take 1 tablet by mouth daily. 400 mg every am.   DENTAGEL 1.1 % Gel dental gel Generic drug:  sodium fluoride   estradiol 0.1 MG/GM vaginal cream Commonly known as:  ESTRACE VAGINAL 1/2 gm once weekly using applicator, apply blueberry sized amount of cream using tip of finger to urethra twice weekly   fish oil-omega-3 fatty acids 1000 MG capsule Take 2 g by mouth daily.   fluticasone 50 MCG/ACT nasal spray Commonly known as:   FLONASE Place 2 sprays into both nostrils daily.   GARLIC 99991111 PO Take 1 tablet by mouth daily. 1 gram   Glucosamine 500 MG Tabs Take 1 tablet by mouth daily.   glucose blood test strip Commonly known as:  ONE TOUCH ULTRA TEST Check four times daily   Insulin Glargine 100 UNIT/ML Solostar Pen Commonly known as:  LANTUS SOLOSTAR Inject 24 Units into the skin every morning.   Insulin Pen Needle 32G X 4 MM Misc 1 pen by Does not apply route 2 (two) times daily. Use as directed 2 times daily   LECITHIN PO Take 1 capsule by mouth every other day.   liraglutide 18 MG/3ML Sopn Commonly known as:  VICTOZA Inject 0.3 mLs (1.8 mg total) into the skin every morning.   loratadine 10 MG tablet Commonly known as:  CLARITIN Take 1 tablet (10 mg total) by mouth daily.   losartan-hydrochlorothiazide 100-25 MG tablet Commonly known as:  HYZAAR Take 1 tablet by mouth daily.   MAGNESIUM-POTASSIUM PO Take 1 tablet by mouth 2 (two) times daily. Reported on 06/30/2015   metoprolol succinate 25 MG 24 hr tablet Commonly known as:  TOPROL-XL TAKE 1 TABLET (25 MG TOTAL) BY MOUTH DAILY.   nateglinide 60 MG tablet Commonly known as:  STARLIX Take 1 tablet (60 mg total) by mouth 3 (three) times daily with meals.   nystatin-triamcinolone ointment Commonly known as:  MYCOLOG Apply 1 application topically 2 (two) times daily.   onetouch ultrasoft lancets Use as instructed   PRESERVISION AREDS 2 Caps Take 1 capsule by mouth 2 (two) times daily.   PROBIOTIC PO Take 1 Dose by mouth at bedtime.   raloxifene 60 MG tablet Commonly known as:  EVISTA Take 1 tablet (60 mg total) by mouth every morning.   rosuvastatin 10 MG tablet Commonly known as:  CRESTOR Take 1 tablet (10 mg total) by mouth daily.   sulfacetamide 10 % ophthalmic solution Commonly known as:  BLEPH-10 Place 1 drop into both eyes 3 (three) times daily as needed.   SYSTANE BALANCE OP Apply 1 drop to eye as needed.    vitamin E 400 UNIT capsule Generic drug:  vitamin E Take 400 Units by mouth daily. Every am       Allergies:  Allergies  Allergen Reactions  . Cyclobenzaprine Hypertension  . Cheese Other (See Comments)    bloating  . Ciprofloxacin Hcl Other (See Comments)    Muscle pain  . Coconut Oil     Family History: Family History  Problem Relation Age of Onset  . Diabetes Mother   . Hypertension Mother   .  Cancer Mother   . Hypertension Father   . Cancer Father     Prostate  . Breast cancer Maternal Aunt 34  . Kidney cancer Neg Hx   . Bladder Cancer Neg Hx     Social History:  reports that she has never smoked. She has never used smokeless tobacco. She reports that she drinks alcohol. She reports that she does not use drugs.  ROS: UROLOGY Frequent Urination?: No Hard to postpone urination?: No Burning/pain with urination?: No Get up at night to urinate?: No Leakage of urine?: No Urine stream starts and stops?: No Trouble starting stream?: No Do you have to strain to urinate?: No Blood in urine?: No Urinary tract infection?: No Sexually transmitted disease?: No Injury to kidneys or bladder?: No Painful intercourse?: No Weak stream?: No Currently pregnant?: No Vaginal bleeding?: No Last menstrual period?: n  Gastrointestinal Nausea?: No Vomiting?: No Indigestion/heartburn?: No Diarrhea?: No Constipation?: No  Constitutional Fever: No Night sweats?: No Weight loss?: No Fatigue?: No  Skin Skin rash/lesions?: No Itching?: No  Eyes Blurred vision?: Yes Double vision?: No  Ears/Nose/Throat Sore throat?: No Sinus problems?: No  Hematologic/Lymphatic Swollen glands?: No Easy bruising?: No  Cardiovascular Leg swelling?: No Chest pain?: No  Respiratory Cough?: No Shortness of breath?: No  Endocrine Excessive thirst?: No  Musculoskeletal Back pain?: No Joint pain?: Yes  Neurological Headaches?: No Dizziness?:  No  Psychologic Depression?: No Anxiety?: No  Physical Exam: BP 138/64   Pulse (!) 59   Ht 5\' 4"  (1.626 m)   Wt 196 lb 4.8 oz (89 kg)   BMI 33.69 kg/m   Constitutional:  Alert and oriented, No acute distress. HEENT: [place] AT, moist mucus membranes.  Trachea midline, no masses. Cardiovascular: No clubbing, cyanosis, or edema. Respiratory: Normal respiratory effort, no increased work of breathing. GI: Abdomen is soft, nontender, nondistended, no abdominal masses GU: No CVA tenderness. Pelvic: external exam demonstrating mild vaginal atrophy, no lesions or abnormal discharge Skin: No rashes, bruises or suspicious lesions. Lymph: No cervical or inguinal adenopathy. Neurologic: Grossly intact, no focal deficits, moving all 4 extremities. Psychiatric: Normal mood and affect.  Laboratory Data:   Pertinent Imaging Results for orders placed or performed in visit on 06/20/16  BLADDER SCAN AMB NON-IMAGING  Result Value Ref Range   Scan Result 0      1. Vaginal Atrophy- Patient will continue vaginal estrogen cream 1/2 g vaginally  weekly as well as blueberry sized amount of cream applied to urethra with finger twice weekly at bedtime. She denies any adverse reactions. No vaginal bleeding. Samples given.    2. Urge incontinence  - minimal bother at [place] time  - continue to monitor     No Follow-up on file.  These notes generated with voice recognition software. I apologize for typographical errors.  Zara Council, [facility] [address] [place], [place] [address] [phone]

## 2016-06-21 DIAGNOSIS — M9901 Segmental and somatic dysfunction of cervical region: Secondary | ICD-10-CM | POA: Diagnosis not present

## 2016-06-21 DIAGNOSIS — M5033 Other cervical disc degeneration, cervicothoracic region: Secondary | ICD-10-CM | POA: Diagnosis not present

## 2016-06-21 DIAGNOSIS — M5416 Radiculopathy, lumbar region: Secondary | ICD-10-CM | POA: Diagnosis not present

## 2016-06-21 DIAGNOSIS — M9903 Segmental and somatic dysfunction of lumbar region: Secondary | ICD-10-CM | POA: Diagnosis not present

## 2016-06-22 DIAGNOSIS — H353132 Nonexudative age-related macular degeneration, bilateral, intermediate dry stage: Secondary | ICD-10-CM | POA: Diagnosis not present

## 2016-06-28 DIAGNOSIS — M5416 Radiculopathy, lumbar region: Secondary | ICD-10-CM | POA: Diagnosis not present

## 2016-06-28 DIAGNOSIS — M5033 Other cervical disc degeneration, cervicothoracic region: Secondary | ICD-10-CM | POA: Diagnosis not present

## 2016-06-28 DIAGNOSIS — M9903 Segmental and somatic dysfunction of lumbar region: Secondary | ICD-10-CM | POA: Diagnosis not present

## 2016-06-28 DIAGNOSIS — M9901 Segmental and somatic dysfunction of cervical region: Secondary | ICD-10-CM | POA: Diagnosis not present

## 2016-06-29 ENCOUNTER — Ambulatory Visit: Payer: Medicare Other | Admitting: Urology

## 2016-07-06 DIAGNOSIS — M9903 Segmental and somatic dysfunction of lumbar region: Secondary | ICD-10-CM | POA: Diagnosis not present

## 2016-07-06 DIAGNOSIS — M5033 Other cervical disc degeneration, cervicothoracic region: Secondary | ICD-10-CM | POA: Diagnosis not present

## 2016-07-06 DIAGNOSIS — M9901 Segmental and somatic dysfunction of cervical region: Secondary | ICD-10-CM | POA: Diagnosis not present

## 2016-07-06 DIAGNOSIS — M5416 Radiculopathy, lumbar region: Secondary | ICD-10-CM | POA: Diagnosis not present

## 2016-07-12 DIAGNOSIS — M9903 Segmental and somatic dysfunction of lumbar region: Secondary | ICD-10-CM | POA: Diagnosis not present

## 2016-07-12 DIAGNOSIS — M5416 Radiculopathy, lumbar region: Secondary | ICD-10-CM | POA: Diagnosis not present

## 2016-07-12 DIAGNOSIS — M9901 Segmental and somatic dysfunction of cervical region: Secondary | ICD-10-CM | POA: Diagnosis not present

## 2016-07-12 DIAGNOSIS — M5033 Other cervical disc degeneration, cervicothoracic region: Secondary | ICD-10-CM | POA: Diagnosis not present

## 2016-07-20 DIAGNOSIS — M5416 Radiculopathy, lumbar region: Secondary | ICD-10-CM | POA: Diagnosis not present

## 2016-07-20 DIAGNOSIS — M9903 Segmental and somatic dysfunction of lumbar region: Secondary | ICD-10-CM | POA: Diagnosis not present

## 2016-07-20 DIAGNOSIS — M9901 Segmental and somatic dysfunction of cervical region: Secondary | ICD-10-CM | POA: Diagnosis not present

## 2016-07-20 DIAGNOSIS — M5033 Other cervical disc degeneration, cervicothoracic region: Secondary | ICD-10-CM | POA: Diagnosis not present

## 2016-07-26 DIAGNOSIS — M5416 Radiculopathy, lumbar region: Secondary | ICD-10-CM | POA: Diagnosis not present

## 2016-07-26 DIAGNOSIS — M9903 Segmental and somatic dysfunction of lumbar region: Secondary | ICD-10-CM | POA: Diagnosis not present

## 2016-07-26 DIAGNOSIS — M5033 Other cervical disc degeneration, cervicothoracic region: Secondary | ICD-10-CM | POA: Diagnosis not present

## 2016-07-26 DIAGNOSIS — M9901 Segmental and somatic dysfunction of cervical region: Secondary | ICD-10-CM | POA: Diagnosis not present

## 2016-08-02 DIAGNOSIS — M5416 Radiculopathy, lumbar region: Secondary | ICD-10-CM | POA: Diagnosis not present

## 2016-08-02 DIAGNOSIS — M9901 Segmental and somatic dysfunction of cervical region: Secondary | ICD-10-CM | POA: Diagnosis not present

## 2016-08-02 DIAGNOSIS — M9903 Segmental and somatic dysfunction of lumbar region: Secondary | ICD-10-CM | POA: Diagnosis not present

## 2016-08-02 DIAGNOSIS — M5033 Other cervical disc degeneration, cervicothoracic region: Secondary | ICD-10-CM | POA: Diagnosis not present

## 2016-08-09 DIAGNOSIS — M9901 Segmental and somatic dysfunction of cervical region: Secondary | ICD-10-CM | POA: Diagnosis not present

## 2016-08-09 DIAGNOSIS — M5033 Other cervical disc degeneration, cervicothoracic region: Secondary | ICD-10-CM | POA: Diagnosis not present

## 2016-08-09 DIAGNOSIS — M9903 Segmental and somatic dysfunction of lumbar region: Secondary | ICD-10-CM | POA: Diagnosis not present

## 2016-08-09 DIAGNOSIS — M5416 Radiculopathy, lumbar region: Secondary | ICD-10-CM | POA: Diagnosis not present

## 2016-08-12 IMAGING — US US EXTREM LOW VENOUS*R*
1 series · 14 of 24 positions shown · non-contrast
Comparison: None.

CLINICAL DATA: Pain and swelling in the right lower extremity.
History of knee replacement 1 month ago.

EXAM:
RIGHT LOWER EXTREMITY VENOUS DOPPLER ULTRASOUND
TECHNIQUE: Gray-scale sonography with graded compression, as well as color
Doppler and duplex ultrasound, were performed to evaluate the deep
venous system from the level of the common femoral vein through the
popliteal and proximal calf veins. Spectral Doppler was utilized to
evaluate flow at rest and with distal augmentation maneuvers.

[Series 1: us extrem low venous*right* · 0.08mm/px · 14 of 33 slices shown]
[im 1/33]
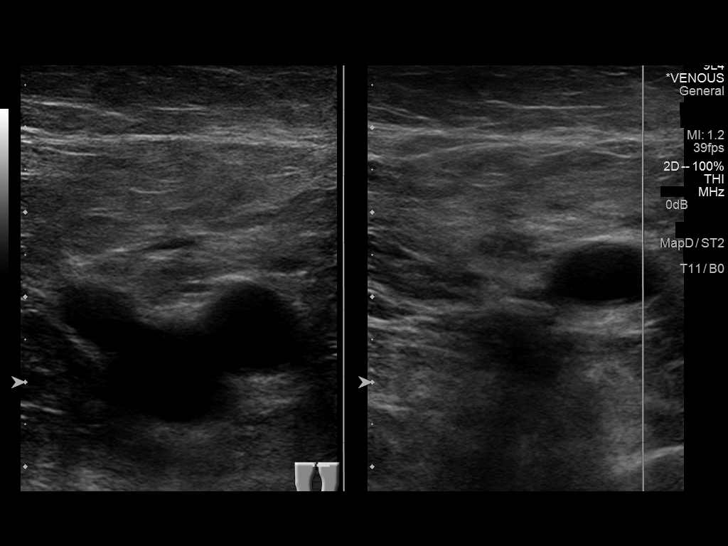
[im 3/33]
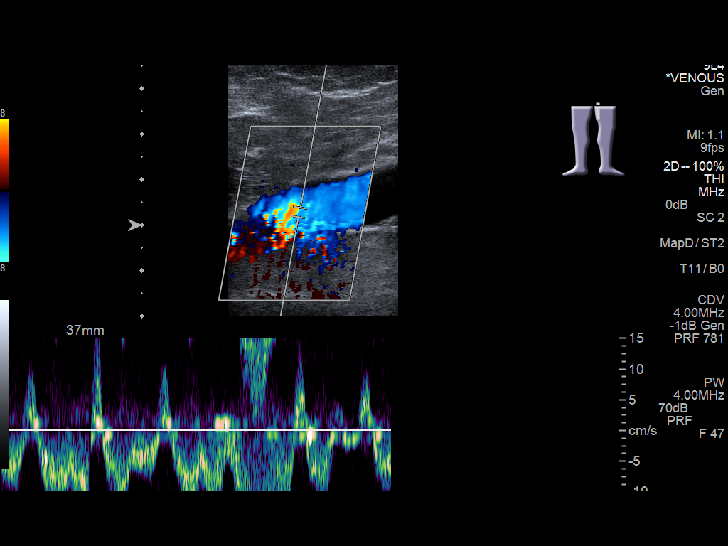
[im 6/33]
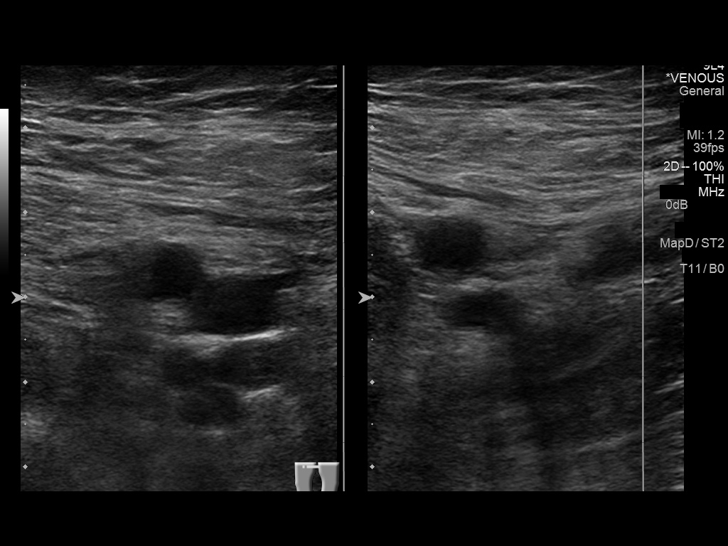
[im 9/33]
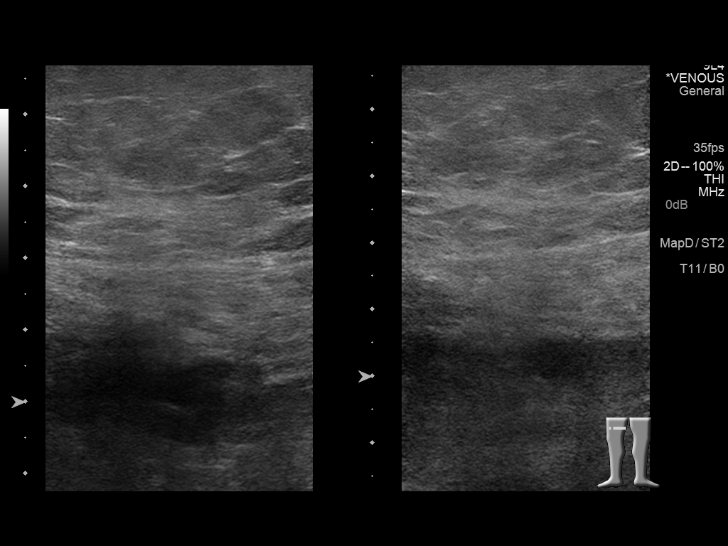
[im 10/33]
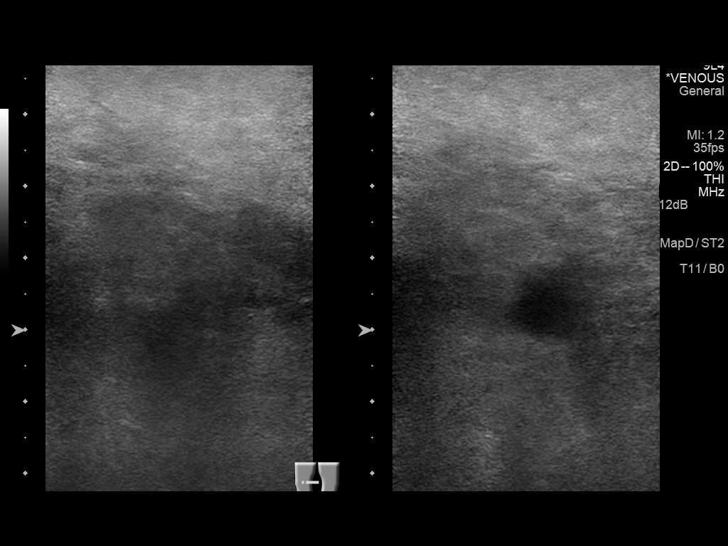
[im 13/33]
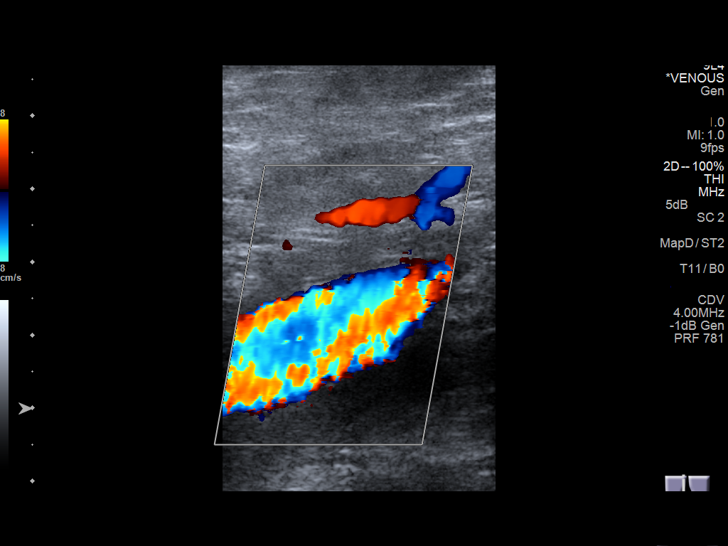
[im 16/33]
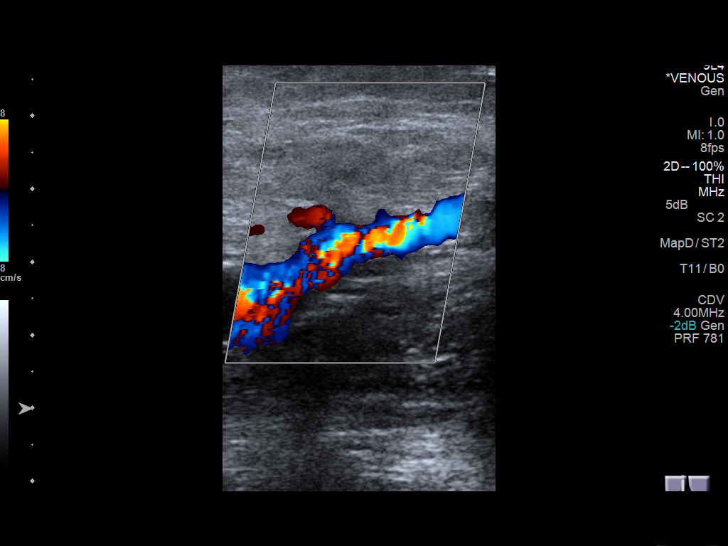
[im 17/33]
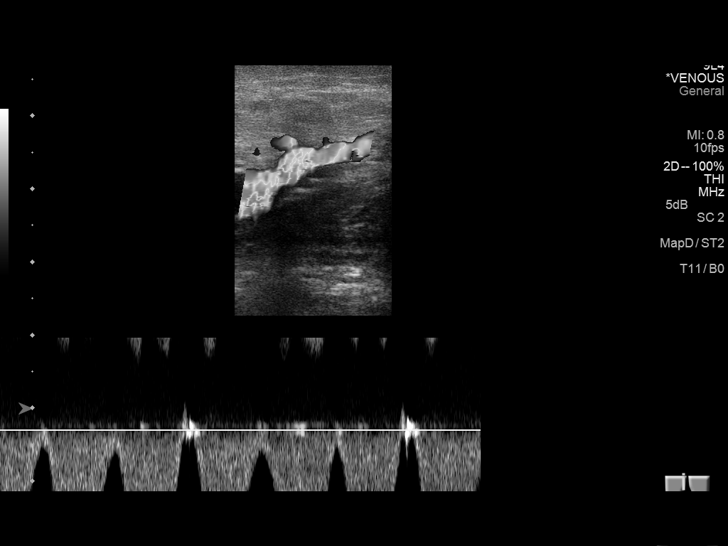
[im 20/33]
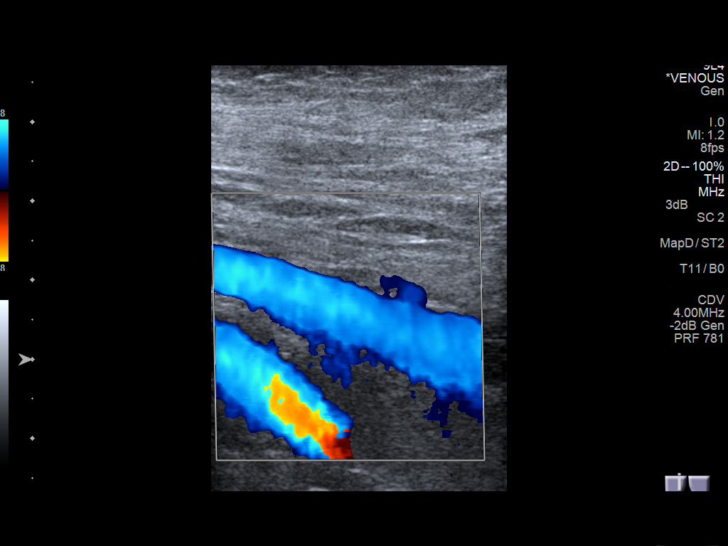
[im 23/33]
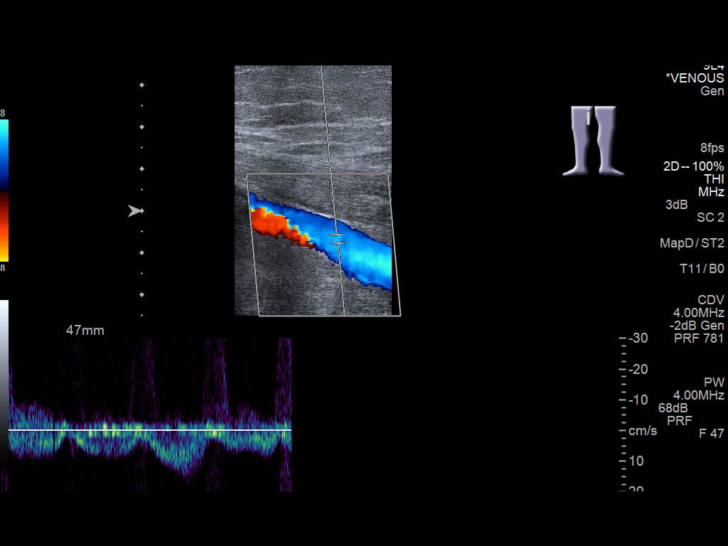
[im 26/33]
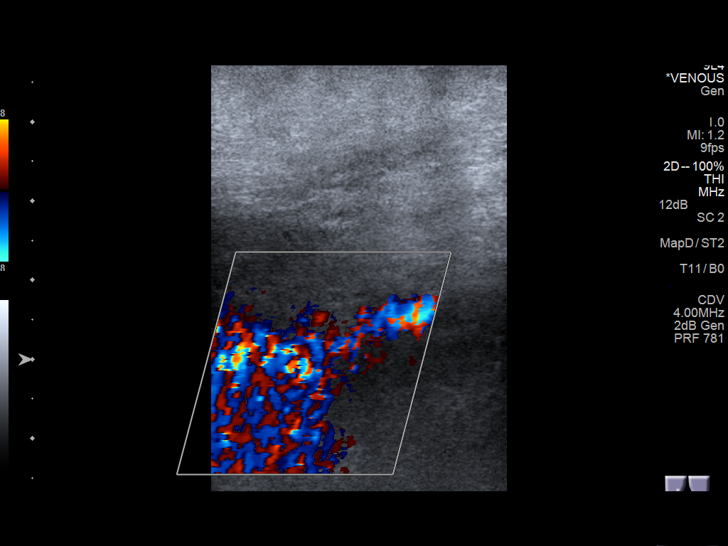
[im 27/33]
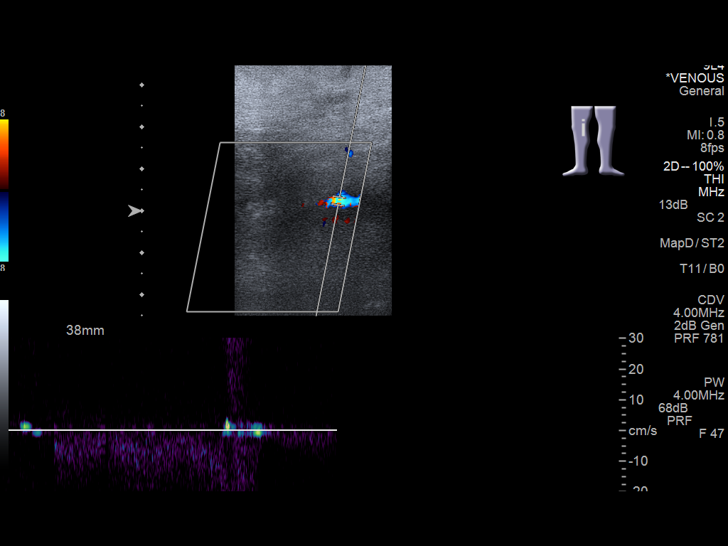
[im 30/33]
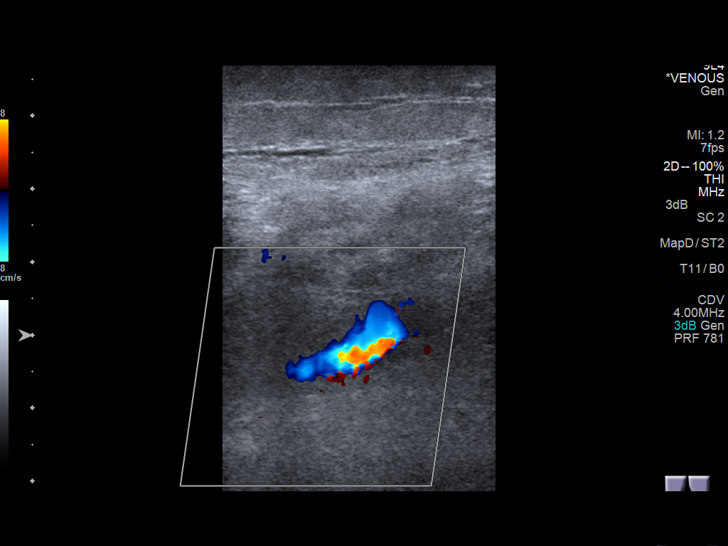
[im 33/33]
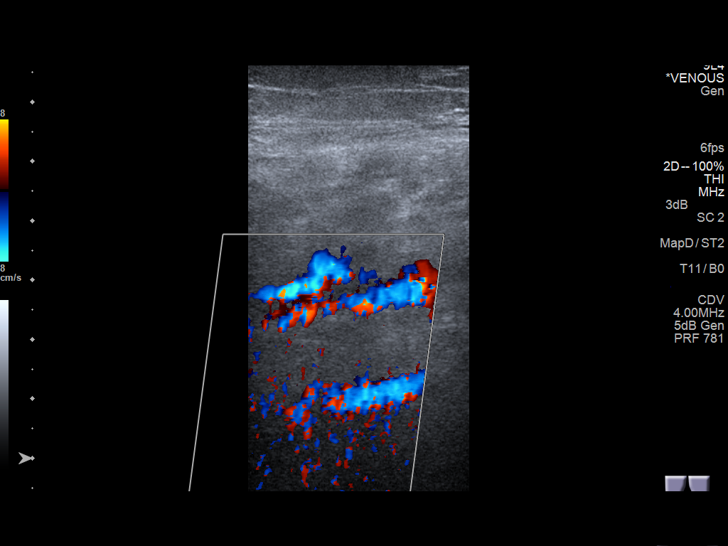

[14 of 24 positions shown; findings below may reference images not displayed]

FINDINGS: Normal compressibility, augmentation and color Doppler flow in the
right common femoral vein, right femoral vein and right popliteal
vein. Right popliteal vein is not well visualized on this
examination. The right saphenofemoral junction is patent. The right
profunda femoral vein is patent. The visualized right deep calf
veins are patent.

The left common femoral vein is compressible without thrombus.
IMPRESSION: Negative for deep vein thrombosis in the right lower extremity.

## 2016-08-16 ENCOUNTER — Other Ambulatory Visit: Payer: Self-pay | Admitting: Family Medicine

## 2016-08-16 DIAGNOSIS — J069 Acute upper respiratory infection, unspecified: Secondary | ICD-10-CM

## 2016-08-16 DIAGNOSIS — M5416 Radiculopathy, lumbar region: Secondary | ICD-10-CM | POA: Diagnosis not present

## 2016-08-16 DIAGNOSIS — M9901 Segmental and somatic dysfunction of cervical region: Secondary | ICD-10-CM | POA: Diagnosis not present

## 2016-08-16 DIAGNOSIS — M9903 Segmental and somatic dysfunction of lumbar region: Secondary | ICD-10-CM | POA: Diagnosis not present

## 2016-08-16 DIAGNOSIS — M5033 Other cervical disc degeneration, cervicothoracic region: Secondary | ICD-10-CM | POA: Diagnosis not present

## 2016-08-16 NOTE — Telephone Encounter (Signed)
Patient requesting refill of Flonase to [facility]. 

## 2016-08-20 ENCOUNTER — Other Ambulatory Visit: Payer: Self-pay | Admitting: Family Medicine

## 2016-08-20 NOTE — Telephone Encounter (Signed)
Patient requesting refill of One Touch Ultra Test Strips to [facility].

## 2016-08-21 ENCOUNTER — Encounter: Payer: Self-pay | Admitting: Family Medicine

## 2016-08-21 ENCOUNTER — Ambulatory Visit (INDEPENDENT_AMBULATORY_CARE_PROVIDER_SITE_OTHER): Payer: Medicare Other | Admitting: Family Medicine

## 2016-08-21 VITALS — BP 122/68 | HR 66 | Temp 98.4°F | Resp 16 | Ht 64.0 in | Wt 195.1 lb

## 2016-08-21 DIAGNOSIS — E1165 Type 2 diabetes mellitus with hyperglycemia: Secondary | ICD-10-CM | POA: Diagnosis not present

## 2016-08-21 DIAGNOSIS — E785 Hyperlipidemia, unspecified: Secondary | ICD-10-CM | POA: Diagnosis not present

## 2016-08-21 DIAGNOSIS — I701 Atherosclerosis of renal artery: Secondary | ICD-10-CM

## 2016-08-21 DIAGNOSIS — E1169 Type 2 diabetes mellitus with other specified complication: Secondary | ICD-10-CM

## 2016-08-21 DIAGNOSIS — I10 Essential (primary) hypertension: Secondary | ICD-10-CM

## 2016-08-21 DIAGNOSIS — Z85820 Personal history of malignant melanoma of skin: Secondary | ICD-10-CM | POA: Diagnosis not present

## 2016-08-21 DIAGNOSIS — Z9889 Other specified postprocedural states: Secondary | ICD-10-CM | POA: Diagnosis not present

## 2016-08-21 DIAGNOSIS — D692 Other nonthrombocytopenic purpura: Secondary | ICD-10-CM | POA: Diagnosis not present

## 2016-08-21 DIAGNOSIS — E113299 Type 2 diabetes mellitus with mild nonproliferative diabetic retinopathy without macular edema, unspecified eye: Secondary | ICD-10-CM

## 2016-08-21 LAB — POCT GLYCOSYLATED HEMOGLOBIN (HGB A1C): Hemoglobin A1C: 10.3

## 2016-08-21 MED ORDER — ROSUVASTATIN CALCIUM 10 MG PO TABS: 10.0000 mg | ORAL_TABLET | Freq: Every day | ORAL | 1 refills | Status: DC

## 2016-08-21 MED ORDER — LOSARTAN POTASSIUM-HCTZ 100-25 MG PO TABS: 1.0000 | ORAL_TABLET | Freq: Every day | ORAL | 1 refills | Status: DC

## 2016-08-21 MED ORDER — GLUCOSE BLOOD VI STRP: 1.0000 | ORAL_STRIP | Freq: Three times a day (TID) | 1 refills | Status: DC

## 2016-08-21 NOTE — Progress Notes (Signed)
Name: Bonnie Rowland   MRN: [id number]    DOB: Aug 26, 1940   Date:08/21/2016       Progress Note  Subjective  Chief Complaint  Chief Complaint  Patient presents with  . Diabetes    2 month follow up  . Hypertension    HPI  DMII: uncontrolled and with diabetic retinopathy, eye exam is up to date. Her hgbA1C has gone up from 6.3% back in 05/2015 to over 9 In June 2017 ,9.1 % 04/2016 and today is up to 10.3%. She states she is compliant with her medication, except for Starlix that she forgets, but not compliant with her diet. She states she likes bread.  She is taking Lantus 26 units Victoza 1.2 - she did not go up on dose because of cost.. She is off metformin because of diarrhea, we added Starlix to take before meals, she also has some Humalog to use only when glucose is above 200 when she wakes up. Very seldom glucose drops.  She is not checking post-prandial levels. She still has polyphagia, denies polydipsia but she has urinary frequency.   HTN: bp is at goal now. No chest pain or palpitation. On ARB  Hyperlipidemia: taking Crestor as prescribed and not side effects, last labs done in July was at goal. She denies muscle aches. No chest pain.   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   History of malignant melanoma: up to date with Dermatologist.   Atherosclerosis of renal artery: bp is at goal, taking Crestor and aspirin as prescribed  Patient Active Problem List   Diagnosis Date Noted  . Bilateral carotid bruits 08/24/2015  . 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 Atrial flutter ([place]) 02/22/2015  . Chronic pain 01/10/2015  . 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 Fatigue 01/10/2015  . 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 Adiposity  01/10/2015  . 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 Spinal stenosis, lumbar region, 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  . DDD (degenerative disc disease), lumbar 11/14/2014  . Greater trochanteric bursitis of both hips 11/14/2014  . Piriformis syndrome 11/14/2014  . 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 Hyperlipemia 11/07/2009  . Hypertension, benign 11/07/2009  . Atherosclerosis of renal artery ([place]) 11/07/2009  . Perennial allergic rhinitis 11/27/2007  . Lumbosacral neuritis 01/17/2007    Past Surgical History:  Procedure Laterality Date  . APPENDECTOMY    . CARDIAC CATHETERIZATION    . cataract surgery    . COLONOSCOPY    . COLONOSCOPY WITH PROPOFOL N/A 05/09/2016   Procedure: COLONOSCOPY WITH PROPOFOL;  Surgeon: Robert Bellow, MD;  Location[facility] ENDOSCOPY;  Service: Endoscopy;  Laterality: N/A;  . DIAGNOSTIC MAMMOGRAM    . EYE SURGERY Bilateral    Cataract Extraction  . KNEE ARTHROPLASTY Right 03/30/2015   Procedure: COMPUTER ASSISTED TOTAL KNEE ARTHROPLASTY;  Surgeon: Dereck Leep, MD;  Location: [facility] ORS;  Service: Orthopedics;  Laterality: Right;  . KNEE ARTHROSCOPY Right   . KNEE CLOSED REDUCTION Right 05/23/2015   Procedure: CLOSED MANIPULATION KNEE;  Surgeon: Dereck Leep, MD;  Location: [facility] ORS;  Service: Orthopedics;  Laterality: Right;  . TONSILLECTOMY      Family History  Problem Relation Age of Onset  . Diabetes Mother   . Hypertension Mother   .  Cancer Mother   . Hypertension Father   . Cancer Father     Prostate  . Breast cancer Maternal Aunt 27  . Kidney cancer Neg Hx   . Bladder Cancer Neg Hx     Social History   Social History  . Marital status: Married    Spouse name: N/A  . Number of children: N/A  . Years of education: N/A   Occupational History  . Not on file.   Social History Main Topics  . Smoking status: Never Smoker  . Smokeless tobacco: Never Used  . Alcohol use 0.0 oz/week     Comment: occ  . Drug use: No  . Sexual  activity: Yes    Partners: Male   Other Topics Concern  . Not on file   Social History Narrative  . No narrative on file     Current Outpatient Prescriptions:  .  BLACK ELDERBERRY,BERRY-FLOWER, PO, Take by mouth., Disp: , Rfl:  .  ALFALFA PO, Take 3 tablets by mouth every morning., Disp: , Rfl:  .  aspirin 81 MG EC tablet, Take 81 mg by mouth at bedtime. , Disp: , Rfl:  .  B Complex-C (B-COMPLEX WITH VITAMIN C) tablet, Take 2 tablets by mouth at bedtime. , Disp: , Rfl:  .  calcium carbonate (OS-CAL) 600 MG TABS tablet, Take 600 mg by mouth daily., Disp: , Rfl:  .  cholecalciferol (VITAMIN D) 1000 UNITS tablet, Take 4,000 Units by mouth daily. , Disp: , Rfl:  .  clobetasol ointment (TEMOVATE) [id number] %, Apply 1 application topically 2 (two) times daily as needed (bug bites). Reported on 06/30/2015, Disp: , Rfl:  .  Coenzyme Q10 (COQ10) 200 MG CAPS, Take 1 tablet by mouth daily. 400 mg every am., Disp: , Rfl:  .  estradiol (ESTRACE VAGINAL) 0.1 MG/GM vaginal cream, 1/2 gm once weekly using applicator, apply blueberry sized amount of cream using tip of finger to urethra twice weekly, Disp: 50 g, Rfl: 4 .  Eyelid Cleansers (AVENOVA) 0.01 % SOLN, See admin instructions., Disp: , Rfl: 3 .  fish oil-omega-3 fatty acids 1000 MG capsule, Take 2 g by mouth daily.  , Disp: , Rfl:  .  fluticasone (FLONASE) 50 MCG/ACT nasal spray, PLACE 2 SPRAYS INTO BOTH NOSTRILS DAILY., Disp: 8.5 g, Rfl: 2 .  GARLIC 99991111 PO, Take 1 tablet by mouth daily. 1 gram, Disp: , Rfl:  .  Glucosamine 500 MG TABS, Take 1 tablet by mouth daily. , Disp: , Rfl:  .  glucose blood (ONE TOUCH ULTRA TEST) test strip, 1 each by Other route 3 (three) times daily. Use as instructed, Disp: 300 each, Rfl: 1 .  Insulin Glargine (LANTUS SOLOSTAR) 100 UNIT/ML Solostar Pen, Inject 24 Units into the skin every morning., Disp: 15 pen, Rfl: 2 .  Insulin Pen Needle 32G X 4 MM MISC, 1 pen by Does not apply route 2 (two) times daily. Use as directed 2  times daily, Disp: 200 each, Rfl: 2 .  Lancets (ONETOUCH ULTRASOFT) lancets, Use as instructed, Disp: 100 each, Rfl: 12 .  LECITHIN PO, Take 1 capsule by mouth every other day., Disp: , Rfl:  .  Liraglutide (VICTOZA) 18 MG/3ML SOPN, Inject 0.3 mLs (1.8 mg total) into the skin every morning., Disp: 9 pen, Rfl: 1 .  loratadine (CLARITIN) 10 MG tablet, Take 1 tablet (10 mg total) by mouth daily. (Patient not taking: Reported on 06/20/2016), Disp: 30 tablet, Rfl: 11 .  losartan-hydrochlorothiazide (HYZAAR) 100-25 MG  tablet, Take 1 tablet by mouth daily., Disp: 90 tablet, Rfl: 1 .  MAGNESIUM-POTASSIUM PO, Take 1 tablet by mouth 2 (two) times daily. Reported on 06/30/2015, Disp: , Rfl:  .  metoprolol succinate (TOPROL-XL) 25 MG 24 hr tablet, TAKE 1 TABLET (25 MG TOTAL) BY MOUTH DAILY., Disp: 90 tablet, Rfl: 3 .  Multiple Vitamins-Minerals (PRESERVISION AREDS 2) CAPS, Take 1 capsule by mouth 2 (two) times daily., Disp: , Rfl:  .  nateglinide (STARLIX) 60 MG tablet, Take 1 tablet (60 mg total) by mouth 3 (three) times daily with meals., Disp: 90 tablet, Rfl: 2 .  nystatin-triamcinolone ointment (MYCOLOG), Apply 1 application topically 2 (two) times daily., Disp: 30 g, Rfl: 3 .  Probiotic Product (PROBIOTIC PO), Take 1 Dose by mouth at bedtime., Disp: , Rfl:  .  Propylene Glycol (SYSTANE BALANCE OP), Apply 1 drop to eye as needed., Disp: , Rfl:  .  raloxifene (EVISTA) 60 MG tablet, Take 1 tablet (60 mg total) by mouth every morning., Disp: 90 tablet, Rfl: 3 .  rosuvastatin (CRESTOR) 10 MG tablet, Take 1 tablet (10 mg total) by mouth daily., Disp: 90 tablet, Rfl: 1 .  sodium fluoride (DENTAGEL) 1.1 % GEL dental gel, , Disp: , Rfl:  .  sulfacetamide (BLEPH-10) 10 % ophthalmic solution, Place 1 drop into both eyes 3 (three) times daily as needed., Disp: , Rfl:  .  vitamin E (VITAMIN E) 400 UNIT capsule, Take 400 Units by mouth daily. Every am, Disp: , Rfl:   Allergies  Allergen Reactions  . Cyclobenzaprine  Hypertension  . Cheese Other (See Comments)    bloating  . Ciprofloxacin Hcl Other (See Comments)    Muscle pain  . Coconut Oil      ROS  Constitutional: Negative for fever or weight change.  Respiratory: Negative for cough and shortness of breath.   Cardiovascular: Negative for chest pain or palpitations.  Gastrointestinal: Negative for abdominal pain, no bowel changes.  Musculoskeletal: negative  for gait problem or joint swelling.  Skin: Negative for rash.  Neurological: Negative for dizziness or headache.  No other specific complaints in a complete review of systems (except as listed in HPI above).  Objective  Vitals:   08/21/16 0846  BP: 122/68  Pulse: 66  Resp: 16  Temp: 98.4 F (36.9 C)  SpO2: 96%  Weight: 195 lb 1 oz (88.5 kg)  Height: 5\' 4"  (1.626 m)    Body mass index is 33.48 kg/m.  Physical Exam  Constitutional: Patient appears well-developed and well-nourished. Obese No distress.  HEENT: head atraumatic, normocephalic, pupils equal and reactive to light, neck supple, throat within normal limits Cardiovascular: Normal rate, regular rhythm and normal heart sounds.  No murmur heard. No BLE edema. Pulmonary/Chest: Effort normal and breath sounds normal. No respiratory distress. Abdominal: Soft.  There is no tenderness. Psychiatric: Patient has a normal mood and affect. behavior is normal. Judgment and thought content normal.   Recent Results (from the past 2160 hour(s))  BLADDER SCAN AMB NON-IMAGING     Status: None   Collection Time: 06/20/16  1:51 PM  Result Value Ref Range   Scan Result 0   POCT HgB A1C     Status: Abnormal   Collection Time: 08/21/16  8:50 AM  Result Value Ref Range   Hemoglobin A1C 10.3      PHQ2/9: Depression screen [facility] 2/9 08/21/2016 06/18/2016 04/23/2016 03/02/2016 02/16/2016  Decreased Interest 0 0 0 0 0  Down, Depressed, Hopeless 0 0 0  0 0  PHQ - 2 Score 0 0 0 0 0     Fall Risk: Fall Risk  08/21/2016 06/18/2016  04/23/2016 03/02/2016 02/16/2016  Falls in the past year? No No No No No    Functional Status Survey: Is the patient deaf or have difficulty hearing?: No Does the patient have difficulty seeing, even when wearing glasses/contacts?: No Does the patient have difficulty concentrating, remembering, or making decisions?: No Does the patient have difficulty walking or climbing stairs?: Yes Does the patient have difficulty dressing or bathing?: No Does the patient have difficulty doing errands alone such as visiting a doctor's office or shopping?: No   Assessment & Plan  1. 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 Use as instructed  Dispense: 300 each; Refill: 1  2. 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(HCC)  stable  3. Hypertension, benign  - losartan-hydrochlorothiazide (HYZAAR) 100-25 MG tablet; Take 1 tablet by mouth daily.  Dispense: 90 tablet; Refill: 1  4. Dyslipidemia associated with type 2 diabetes mellitus (HCC)  - rosuvastatin (CRESTOR) 10 MG tablet; Take 1 tablet (10 mg total) by mouth daily.  Dispense: 90 tablet; Refill: 1   5. Atherosclerosis of renal artery (HCC)  Continue current regiment   6. History of melanoma excision  Doing well, up to date with follow up with Dermatologist

## 2016-08-21 NOTE — Patient Instructions (Signed)
My Fitness Pal

## 2016-08-23 ENCOUNTER — Ambulatory Visit (INDEPENDENT_AMBULATORY_CARE_PROVIDER_SITE_OTHER): Payer: Medicare Other | Admitting: Cardiovascular Disease

## 2016-08-23 ENCOUNTER — Encounter: Payer: Self-pay | Admitting: Cardiovascular Disease

## 2016-08-23 VITALS — BP 130/72 | HR 66 | Ht 64.0 in | Wt 198.2 lb

## 2016-08-23 DIAGNOSIS — I701 Atherosclerosis of renal artery: Secondary | ICD-10-CM | POA: Diagnosis not present

## 2016-08-23 DIAGNOSIS — E113299 Type 2 diabetes mellitus with mild nonproliferative diabetic retinopathy without macular edema, unspecified eye: Secondary | ICD-10-CM

## 2016-08-23 DIAGNOSIS — I10 Essential (primary) hypertension: Secondary | ICD-10-CM | POA: Diagnosis not present

## 2016-08-23 DIAGNOSIS — M5033 Other cervical disc degeneration, cervicothoracic region: Secondary | ICD-10-CM | POA: Diagnosis not present

## 2016-08-23 DIAGNOSIS — E1165 Type 2 diabetes mellitus with hyperglycemia: Secondary | ICD-10-CM

## 2016-08-23 DIAGNOSIS — M5416 Radiculopathy, lumbar region: Secondary | ICD-10-CM | POA: Diagnosis not present

## 2016-08-23 DIAGNOSIS — I4892 Unspecified atrial flutter: Secondary | ICD-10-CM

## 2016-08-23 DIAGNOSIS — R0989 Other specified symptoms and signs involving the circulatory and respiratory systems: Secondary | ICD-10-CM | POA: Diagnosis not present

## 2016-08-23 DIAGNOSIS — M9901 Segmental and somatic dysfunction of cervical region: Secondary | ICD-10-CM | POA: Diagnosis not present

## 2016-08-23 DIAGNOSIS — E785 Hyperlipidemia, unspecified: Secondary | ICD-10-CM

## 2016-08-23 DIAGNOSIS — M9903 Segmental and somatic dysfunction of lumbar region: Secondary | ICD-10-CM | POA: Diagnosis not present

## 2016-08-23 NOTE — Patient Instructions (Signed)

## 2016-08-23 NOTE — Progress Notes (Signed)
Cardiology Office Note  Date:  08/23/2016   ID:  Bisan, Blight [birth year], MRN [id number]  PCP:  Loistine Chance, MD   Chief Complaint  Patient presents with  . other    12 month follow up. Meds reviewed by the pt. verbally. "doing well."     HPI:  Ms. Golin is a very pleasant 76 year old woman with a history of chest pain, normal coronary arteries by cardiac catheterization April 2007 ,  < 50%  renal artery disease,  hyperlipidemia, diabetes, hypertension, obesity who presents for evaluation of recent episode of atrial flutter, status post right total knee replacement surgery  HBA1C 10.3, up from 9.1 Now watching her diet Going to lifestyle group   maintaining normal sinus rhythm Goes to gym 3 days per week  knee replacement surgery at the end of September 2016.  Still with little pain Wears compression hose  Total chol 110  EKG on today's visit shows normal sinus rhythm with APCs rate 66 bpm otherwise no significant ST or T-wave changes  Other past medical history 02/21/15 she presented to [facility] for right total knee replacement by Dr. Marry Guan. She reported developing tachycardia that morning. She had extreme pain, was rushing to get to the hospital. EKG showed atrial flutter with ventricular rate 140 bpm. She was sent to the emergency room, 3 hours later was back in normal sinus rhythm. No medications were given to restore normal sinus rhythm.  Hemoglobin A1c increased on prednisone, into the 8 range, now improved in the 7 range  Previous lab work Total cholesterol 120, LDL in the 50s  She also has a history of sciatica and a meniscus tear on the left. She had a echocardiogram in May 2006 which was essentially normal with ejection fraction 60%.   Stress test in 06, 03 and 01 had been normal.  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  PSH:    Past Surgical History:  Procedure Laterality Date  . APPENDECTOMY    . CARDIAC CATHETERIZATION    . cataract surgery    . COLONOSCOPY    . COLONOSCOPY WITH PROPOFOL N/A 05/09/2016   Procedure: COLONOSCOPY WITH PROPOFOL;  Surgeon: Robert Bellow, MD;  Location: [facility] ENDOSCOPY;  Service: Endoscopy;  Laterality: N/A;  . DIAGNOSTIC MAMMOGRAM    . EYE SURGERY Bilateral    Cataract Extraction  . KNEE ARTHROPLASTY Right 03/30/2015   Procedure: COMPUTER ASSISTED TOTAL KNEE ARTHROPLASTY;  Surgeon: Dereck Leep, MD;  Location: [facility] ORS;  Service: Orthopedics;  Laterality: Right;  . KNEE ARTHROSCOPY Right   . KNEE CLOSED REDUCTION Right 05/23/2015   Procedure: CLOSED MANIPULATION KNEE;  Surgeon: Dereck Leep, MD;  Location: [facility] ORS;  Service: Orthopedics;  Laterality: Right;  . TONSILLECTOMY      Current Outpatient Prescriptions  Medication Sig Dispense Refill  . ALFALFA PO Take 3 tablets by mouth every morning.    Marland Kitchen aspirin 81 MG EC tablet Take 81 mg by mouth at bedtime.     . B Complex-C (B-COMPLEX WITH VITAMIN C) tablet Take 2 tablets by mouth at  bedtime.     Marland Kitchen BLACK ELDERBERRY,BERRY-FLOWER, PO Take by mouth.    . calcium carbonate (OS-CAL) 600 MG TABS tablet Take 600 mg by mouth daily.    . cholecalciferol (VITAMIN D) 1000 UNITS tablet Take 4,000 Units by mouth daily.     . clobetasol ointment (TEMOVATE) [id number] % Apply 1 application topically 2 (two) times daily as needed (bug bites). Reported on 06/30/2015    . Coenzyme Q10 (COQ10) 200 MG CAPS Take 1 tablet by mouth  daily. 400 mg every am.    . estradiol (ESTRACE VAGINAL) 0.1 MG/GM vaginal cream 1/2 gm once weekly using applicator, apply blueberry sized amount of cream using tip of finger to urethra twice weekly 50 g 4  . Eyelid Cleansers (AVENOVA) 0.01 % SOLN See admin instructions.  3  . fish oil-omega-3 fatty acids 1000 MG capsule Take 2 g by mouth daily.      . fluticasone (FLONASE) 50 MCG/ACT nasal spray PLACE 2 SPRAYS INTO BOTH NOSTRILS DAILY. 8.5 g 2  . GARLIC [id number] PO Take 1 tablet by mouth daily. 1 gram    . Glucosamine 500 MG TABS Take 1 tablet by mouth daily.     Marland Kitchen glucose blood (ONE TOUCH ULTRA TEST) test strip 1 each by Other route 3 (three) times daily. Use as instructed 300 each 1  . Insulin Glargine (LANTUS SOLOSTAR) 100 UNIT/ML Solostar Pen Inject 24 Units into the skin every morning. 15 pen 2  . Insulin Pen Needle 32G X 4 MM MISC 1 pen by Does not apply route 2 (two) times daily. Use as directed 2 times daily 200 each 2  . Lancets (ONETOUCH ULTRASOFT) lancets Use as instructed 100 each 12  . LECITHIN PO Take 1 capsule by mouth every other day.    . Liraglutide (VICTOZA) 18 MG/3ML SOPN Inject 0.3 mLs (1.8 mg total) into the skin every morning. 9 pen 1  . loratadine (CLARITIN) 10 MG tablet Take 1 tablet (10 mg total) by mouth daily. 30 tablet 11  . losartan-hydrochlorothiazide (HYZAAR) 100-25 MG tablet Take 1 tablet by mouth daily. 90 tablet 1  . MAGNESIUM-POTASSIUM PO Take 1 tablet by mouth 2 (two) times daily. Reported on 06/30/2015    . metoprolol succinate (TOPROL-XL) 25 MG 24 hr tablet TAKE 1 TABLET (25 MG TOTAL) BY MOUTH DAILY. 90 tablet 3  . Multiple Vitamins-Minerals (PRESERVISION AREDS 2) CAPS Take 1 capsule by mouth 2 (two) times daily.    . nateglinide (STARLIX) 60 MG tablet Take 1 tablet (60 mg total) by mouth 3 (three) times daily with meals. 90 tablet 2  . nystatin-triamcinolone ointment (MYCOLOG) Apply 1 application topically 2 (two) times daily. 30 g 3  . Probiotic Product  (PROBIOTIC PO) Take 1 Dose by mouth at bedtime.    Marland Kitchen Propylene Glycol (SYSTANE BALANCE OP) Apply 1 drop to eye as needed.    . raloxifene (EVISTA) 60 MG tablet Take 1 tablet (60 mg total) by mouth every morning. 90 tablet 3  . rosuvastatin (CRESTOR) 10 MG tablet Take 1 tablet (10 mg total) by mouth daily. 90 tablet 1  . sodium fluoride (DENTAGEL) 1.1 % GEL dental gel     . sulfacetamide (BLEPH-10) 10 % ophthalmic solution Place 1 drop into both eyes 3 (three) times daily as needed.    . vitamin E (VITAMIN E) 400 UNIT capsule Take 400 Units by mouth daily. Every am     No current facility-administered medications for this visit.  Allergies:   Cyclobenzaprine; Cheese; Ciprofloxacin hcl; and Coconut oil   Social History:  The patient  reports that she has never smoked. She has never used smokeless tobacco. She reports that she drinks alcohol. She reports that she does not use drugs.   Family History:   family history includes Breast cancer (age of onset: 66) in her maternal aunt; Cancer in her father and mother; Diabetes in her mother; Hypertension in her father and mother.    Review of Systems: Review of Systems  Constitutional: Negative.   Respiratory: Negative.   Cardiovascular: Negative.   Gastrointestinal: Negative.   Musculoskeletal: Negative.   Neurological: Negative.   Psychiatric/Behavioral: Negative.   All other systems reviewed and are negative.    PHYSICAL EXAM: VS:  BP 130/72 (BP Location: Left Arm, Patient Position: Sitting, Cuff Size: Normal)   Pulse 66   Ht 5\' 4"  (1.626 m)   Wt 198 lb 4 oz (89.9 kg)   BMI 34.03 kg/m  , BMI Body mass index is 34.03 kg/m. GEN: Well nourished, well developed, in no acute distress , obese HEENT: normal  Neck: no JVD, carotid bruits, or masses Cardiac: RRR; no murmurs, rubs, or gallops, trace nonpitting lower extremity edema  Respiratory:  clear to auscultation bilaterally, normal work of breathing GI: soft, nontender,  nondistended, + BS MS: no deformity or atrophy  Skin: warm and dry, no rash Neuro:  Strength and sensation are intact Psych: euthymic mood, full affect    Recent Labs: 01/12/2016: ALT 15; BUN 11; Creatinine, Ser 0.74; Potassium 4.0; Sodium 140    Lipid Panel Lab Results  Component Value Date   CHOL 110 01/12/2016   HDL 59 01/12/2016   LDLCALC 39 01/12/2016   TRIG 58 01/12/2016      Wt Readings from Last 3 Encounters:  08/23/16 198 lb 4 oz (89.9 kg)  08/21/16 195 lb 1 oz (88.5 kg)  06/20/16 196 lb 4.8 oz (89 kg)       ASSESSMENT AND PLAN:  Hyperlipidemia, unspecified hyperlipidemia type - Plan: EKG 12-Lead Cholesterol is at goal on the current lipid regimen. No changes to the medications were made.  Hypertension, benign - Plan: EKG 12-Lead Blood pressure is well controlled on today's visit. No changes made to the medications.  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 EKG 12-Lead     Signed, Esmond Plants, M.D., Ph.D. 08/23/2016  [place], [place]

## 2016-08-30 DIAGNOSIS — E1165 Type 2 diabetes mellitus with hyperglycemia: Secondary | ICD-10-CM | POA: Diagnosis not present

## 2016-08-30 DIAGNOSIS — R300 Dysuria: Secondary | ICD-10-CM | POA: Diagnosis not present

## 2016-08-30 DIAGNOSIS — Z794 Long term (current) use of insulin: Secondary | ICD-10-CM | POA: Diagnosis not present

## 2016-09-06 DIAGNOSIS — M5416 Radiculopathy, lumbar region: Secondary | ICD-10-CM | POA: Diagnosis not present

## 2016-09-06 DIAGNOSIS — M5033 Other cervical disc degeneration, cervicothoracic region: Secondary | ICD-10-CM | POA: Diagnosis not present

## 2016-09-06 DIAGNOSIS — M9903 Segmental and somatic dysfunction of lumbar region: Secondary | ICD-10-CM | POA: Diagnosis not present

## 2016-09-06 DIAGNOSIS — M9901 Segmental and somatic dysfunction of cervical region: Secondary | ICD-10-CM | POA: Diagnosis not present

## 2016-09-10 ENCOUNTER — Ambulatory Visit: Payer: Medicare Other | Admitting: Dietician

## 2016-09-10 ENCOUNTER — Encounter: Payer: Self-pay | Admitting: Dietician

## 2016-09-10 NOTE — Progress Notes (Signed)
Rescheduled patient's appointment from today to 09/24/16, due to inclement weather.

## 2016-09-13 DIAGNOSIS — M9901 Segmental and somatic dysfunction of cervical region: Secondary | ICD-10-CM | POA: Diagnosis not present

## 2016-09-13 DIAGNOSIS — M5033 Other cervical disc degeneration, cervicothoracic region: Secondary | ICD-10-CM | POA: Diagnosis not present

## 2016-09-13 DIAGNOSIS — M9903 Segmental and somatic dysfunction of lumbar region: Secondary | ICD-10-CM | POA: Diagnosis not present

## 2016-09-13 DIAGNOSIS — M5416 Radiculopathy, lumbar region: Secondary | ICD-10-CM | POA: Diagnosis not present

## 2016-09-20 DIAGNOSIS — M9903 Segmental and somatic dysfunction of lumbar region: Secondary | ICD-10-CM | POA: Diagnosis not present

## 2016-09-20 DIAGNOSIS — M9901 Segmental and somatic dysfunction of cervical region: Secondary | ICD-10-CM | POA: Diagnosis not present

## 2016-09-20 DIAGNOSIS — M5416 Radiculopathy, lumbar region: Secondary | ICD-10-CM | POA: Diagnosis not present

## 2016-09-20 DIAGNOSIS — M5033 Other cervical disc degeneration, cervicothoracic region: Secondary | ICD-10-CM | POA: Diagnosis not present

## 2016-09-24 ENCOUNTER — Encounter: Payer: Self-pay | Admitting: Dietician

## 2016-09-24 ENCOUNTER — Encounter: Payer: Medicare Other | Attending: Family Medicine | Admitting: Dietician

## 2016-09-24 VITALS — Ht 64.0 in | Wt 196.9 lb

## 2016-09-24 DIAGNOSIS — Z6833 Body mass index (BMI) 33.0-33.9, adult: Secondary | ICD-10-CM

## 2016-09-24 DIAGNOSIS — E1165 Type 2 diabetes mellitus with hyperglycemia: Secondary | ICD-10-CM | POA: Diagnosis not present

## 2016-09-24 DIAGNOSIS — E113299 Type 2 diabetes mellitus with mild nonproliferative diabetic retinopathy without macular edema, unspecified eye: Secondary | ICD-10-CM | POA: Diagnosis not present

## 2016-09-24 DIAGNOSIS — Z713 Dietary counseling and surveillance: Secondary | ICD-10-CM | POA: Diagnosis not present

## 2016-09-24 DIAGNOSIS — E669 Obesity, unspecified: Secondary | ICD-10-CM

## 2016-09-24 DIAGNOSIS — E119 Type 2 diabetes mellitus without complications: Secondary | ICD-10-CM

## 2016-09-24 DIAGNOSIS — Z794 Long term (current) use of insulin: Secondary | ICD-10-CM

## 2016-09-24 NOTE — Patient Instructions (Addendum)
   Include 2 small servings of carbohydrate with each meal, or 30grams of carbs. Consistent carb intake will help keep blood sugars consistent.   Aim for 7grams or more of protein with every meal; or 50-60grams of protein from protein foods with every meal.   Continue to eat plenty of low-carb veggies throughout the day.   Keep up your regular exercise and healthy food choices, great job!

## 2016-09-24 NOTE — Progress Notes (Signed)
Medical Nutrition Therapy: Visit start time: 1330  end time: 1430  Assessment:  Diagnosis: diabetes Past medical history: HTN, some diarrhea (less since stopped Metformin) Psychosocial issues/ stress concerns: none Preferred learning method:  . Auditory . Visual . Hands-on  Current weight: 196.9lbs with shoes Height: 5'4" Medications, supplements: reconciled list in medical record  Progress and evaluation: patient reports HbA1C of 10.3% on 08/21/16; Dr. Graceann Congress is making medication adjustments and patient has been working to reduce carbohydrate intake. She states she is not much of a meat eater, and feels her protein intake might be too low.  She loves bread and pasta, but has reduced amounts and frequency. Pizza once every 2 weeks. Her husband is following a modified paleo diet. She has had Diabetes and MNT visits at this office in the past, and requests review of diet and accountability help.   Physical activity: gym exercise 30 minutes, 3 times a week  Dietary Intake:  Usual eating pattern includes 2-3 meals and 2 snacks per day. Dining out frequency: 6 meals per week.  Breakfast: used to eat eggs, not recently. chicken salad and apple salad, or peanut butter with pickle and potato chips Snack: nuts or yogurt with protein powder and 1 strawberry Lunch: salad-- lettuce, spinach, celery, peppers, carrots, olives, avocado, fat free dressing with chicken or ham. BLT sandwich, shrimp, fish-- salmon with salad when out Snack: same as am Supper: trying to eat smaller amounts in evening; sometimes nuts cashews, macadamias, pecans, almonds; or salad Snack: often small portion of dark chocolate-- 1-3 small peppermint patties Beverages: water 16oz 3x a day; tea with stevia (green tea or cinnamon tea), occasionally diet coke  Nutrition Care Education: Topics covered: diabetes Basic nutrition: basic food groups, appropriate nutrient balance, appropriate meal and snack schedule    Advanced  nutrition: food label reading Diabetes:  goals for BGs, appropriate meal and snack schedule, appropriate carb intake and balance, protein needs and appropriate portions of protein foods -- advised 80grams daily with 50-60grams from protein foods. Discussed healthy carbohydrate choices, importance of adequate and consistent carbohydrate intake to promote consistent BGs. Instructed on basic meal planning using plate method and food models. Reviewed hypoglycemia treatment.  Hypertension: identifying high sodium foods, identifying food sources of potassium, magnesium   Nutritional Diagnosis:  [place]-2.2 Altered nutrition-related laboratory As related to Diabetes.  As evidenced by HbA1C of 10.3%. -3.3 Overweight/obesity As related to history of excess calories, inactivity.  As evidenced by patient report.  Intervention: Instruction as noted above.   Set goals with input from patient.    She will work to improve consistency of carbohydrate intake.   Education Materials given:  . Plate Planner . Goals/ instructions  Learner/ who was taught:  . Patient   Level of understanding: Marland Kitchen Verbalizes/ demonstrates competency  Demonstrated degree of understanding via:   Teach back Learning barriers: . None  Willingness to learn/ readiness for change: . Eager, change in progress  Monitoring and Evaluation:  Dietary intake, exercise, BG control, and body weight      follow up: 10/22/16

## 2016-09-27 DIAGNOSIS — M5416 Radiculopathy, lumbar region: Secondary | ICD-10-CM | POA: Diagnosis not present

## 2016-09-27 DIAGNOSIS — M9901 Segmental and somatic dysfunction of cervical region: Secondary | ICD-10-CM | POA: Diagnosis not present

## 2016-09-27 DIAGNOSIS — M5033 Other cervical disc degeneration, cervicothoracic region: Secondary | ICD-10-CM | POA: Diagnosis not present

## 2016-09-27 DIAGNOSIS — M9903 Segmental and somatic dysfunction of lumbar region: Secondary | ICD-10-CM | POA: Diagnosis not present

## 2016-10-04 DIAGNOSIS — M5033 Other cervical disc degeneration, cervicothoracic region: Secondary | ICD-10-CM | POA: Diagnosis not present

## 2016-10-04 DIAGNOSIS — M9903 Segmental and somatic dysfunction of lumbar region: Secondary | ICD-10-CM | POA: Diagnosis not present

## 2016-10-04 DIAGNOSIS — M5416 Radiculopathy, lumbar region: Secondary | ICD-10-CM | POA: Diagnosis not present

## 2016-10-04 DIAGNOSIS — M9901 Segmental and somatic dysfunction of cervical region: Secondary | ICD-10-CM | POA: Diagnosis not present

## 2016-10-08 DIAGNOSIS — E1165 Type 2 diabetes mellitus with hyperglycemia: Secondary | ICD-10-CM | POA: Diagnosis not present

## 2016-10-08 DIAGNOSIS — Z794 Long term (current) use of insulin: Secondary | ICD-10-CM | POA: Diagnosis not present

## 2016-10-11 DIAGNOSIS — M5033 Other cervical disc degeneration, cervicothoracic region: Secondary | ICD-10-CM | POA: Diagnosis not present

## 2016-10-11 DIAGNOSIS — M5416 Radiculopathy, lumbar region: Secondary | ICD-10-CM | POA: Diagnosis not present

## 2016-10-11 DIAGNOSIS — M9901 Segmental and somatic dysfunction of cervical region: Secondary | ICD-10-CM | POA: Diagnosis not present

## 2016-10-11 DIAGNOSIS — M9903 Segmental and somatic dysfunction of lumbar region: Secondary | ICD-10-CM | POA: Diagnosis not present

## 2016-10-18 DIAGNOSIS — M5033 Other cervical disc degeneration, cervicothoracic region: Secondary | ICD-10-CM | POA: Diagnosis not present

## 2016-10-18 DIAGNOSIS — M9903 Segmental and somatic dysfunction of lumbar region: Secondary | ICD-10-CM | POA: Diagnosis not present

## 2016-10-18 DIAGNOSIS — M9901 Segmental and somatic dysfunction of cervical region: Secondary | ICD-10-CM | POA: Diagnosis not present

## 2016-10-18 DIAGNOSIS — M5416 Radiculopathy, lumbar region: Secondary | ICD-10-CM | POA: Diagnosis not present

## 2016-10-22 ENCOUNTER — Encounter: Payer: Medicare Other | Attending: Family Medicine | Admitting: Dietician

## 2016-10-22 ENCOUNTER — Encounter: Payer: Self-pay | Admitting: Dietician

## 2016-10-22 VITALS — Ht 64.0 in | Wt 196.7 lb

## 2016-10-22 DIAGNOSIS — Z6833 Body mass index (BMI) 33.0-33.9, adult: Secondary | ICD-10-CM

## 2016-10-22 DIAGNOSIS — E119 Type 2 diabetes mellitus without complications: Secondary | ICD-10-CM

## 2016-10-22 DIAGNOSIS — Z713 Dietary counseling and surveillance: Secondary | ICD-10-CM | POA: Diagnosis not present

## 2016-10-22 DIAGNOSIS — E669 Obesity, unspecified: Secondary | ICD-10-CM

## 2016-10-22 DIAGNOSIS — Z794 Long term (current) use of insulin: Secondary | ICD-10-CM

## 2016-10-22 DIAGNOSIS — E1165 Type 2 diabetes mellitus with hyperglycemia: Secondary | ICD-10-CM | POA: Diagnosis not present

## 2016-10-22 DIAGNOSIS — E113299 Type 2 diabetes mellitus with mild nonproliferative diabetic retinopathy without macular edema, unspecified eye: Secondary | ICD-10-CM | POA: Diagnosis not present

## 2016-10-22 NOTE — Patient Instructions (Signed)
   Continue with current healthy food choices and good control of carb intake.

## 2016-10-22 NOTE — Progress Notes (Signed)
Medical Nutrition Therapy: Visit start time: 4098  end time: 1191 Assessment:  Diagnosis: diabetes Medical history changes: no changes per patient Psychosocial issues/ stress concerns: none  Current weight: 196.7lbs  Height: 5'4" Medications, supplement changes: no changes per patient Progress and evaluation: Patient reports improvement in BGs, both fasting and post-meal. She reports one episode of hypoglycemia (BG 65) which she treated with orange juice. She has been remodeling a bathroom in her home and therefore has not been able to keep up with regular exercise in the past 2 weeks. She reports further decreasing bread intake, and feels she has broken her bad habit of overeating bread.    Physical activity: no gym visits in past 2 weeks.   Dietary Intake:  Usual eating pattern includes 2-3 meals and 2 snacks per day. Dining out frequency: not assessed today.  Breakfast:9am or earlier  [place] yogurt with added protein and strawberries; planning to try grape nuts and banana Snack: cashews Lunch: sometimes as late as 3pm-- salad with chicken salad or tuna salad and fruit, walnuts, avocados, with balsamic vinegar and olive oil Snack: same as am, or occasionally madarin orange or apple Supper: small meal, sometimes just nuts, carrot or celery sticks with Ken's viniagrette dressing as late as 9pm Snack: dark chocolate, 2-3 mini peppermint patties Beverages: water, rarely diet soda  Nutrition Care Education: Topics covered: diabetes, GI health Weight control: reviewed patient's eating pattern and activity Diabetes: appropriate meal and snack schedule, appropriate carb intake and balance, reviewed importance of consistent carb intake and protein sources; discussed causes of hypoglycemia.  Other:  Discussed chronic diarrhea and possible benefits of probiotics and prebiotics, provided FODMap diet resource for patient to review.   Nutritional Diagnosis:  [place]-2.2 Altered nutrition-related  laboratory As related to diabetes.  As evidenced by HbA1C 10%. [place]-3.3 Overweight/obesity As related to history of excess calories, inactivity.  As evidenced by BMI 32.  Intervention: Discussion as noted above.   Patient will continue with current eating pattern.   Patient will review FODmap diet and consider trial elimination of some foods.      Education Materials given:  Marland Kitchen FODMap diet . Goals/ instructions  Learner/ who was taught:  . Patient   Level of understanding: Marland Kitchen Verbalizes/ demonstrates competency  Demonstrated degree of understanding via:   Teach back Learning barriers: . None  Willingness to learn/ readiness for change: . Eager, change in progress  Monitoring and Evaluation:  Dietary intake, exercise, BG control, and body weight      follow up: 12/17/16

## 2016-10-25 DIAGNOSIS — M5033 Other cervical disc degeneration, cervicothoracic region: Secondary | ICD-10-CM | POA: Diagnosis not present

## 2016-10-25 DIAGNOSIS — M9901 Segmental and somatic dysfunction of cervical region: Secondary | ICD-10-CM | POA: Diagnosis not present

## 2016-10-25 DIAGNOSIS — M9903 Segmental and somatic dysfunction of lumbar region: Secondary | ICD-10-CM | POA: Diagnosis not present

## 2016-10-25 DIAGNOSIS — M5416 Radiculopathy, lumbar region: Secondary | ICD-10-CM | POA: Diagnosis not present

## 2016-10-26 DIAGNOSIS — Z85820 Personal history of malignant melanoma of skin: Secondary | ICD-10-CM | POA: Diagnosis not present

## 2016-10-26 DIAGNOSIS — Z85828 Personal history of other malignant neoplasm of skin: Secondary | ICD-10-CM | POA: Diagnosis not present

## 2016-10-26 DIAGNOSIS — D485 Neoplasm of uncertain behavior of skin: Secondary | ICD-10-CM | POA: Diagnosis not present

## 2016-10-26 DIAGNOSIS — L821 Other seborrheic keratosis: Secondary | ICD-10-CM | POA: Diagnosis not present

## 2016-10-26 DIAGNOSIS — D225 Melanocytic nevi of trunk: Secondary | ICD-10-CM | POA: Diagnosis not present

## 2016-10-26 DIAGNOSIS — X32XXXA Exposure to sunlight, initial encounter: Secondary | ICD-10-CM | POA: Diagnosis not present

## 2016-10-26 DIAGNOSIS — L570 Actinic keratosis: Secondary | ICD-10-CM | POA: Diagnosis not present

## 2016-11-01 DIAGNOSIS — M5033 Other cervical disc degeneration, cervicothoracic region: Secondary | ICD-10-CM | POA: Diagnosis not present

## 2016-11-01 DIAGNOSIS — M9901 Segmental and somatic dysfunction of cervical region: Secondary | ICD-10-CM | POA: Diagnosis not present

## 2016-11-01 DIAGNOSIS — M9903 Segmental and somatic dysfunction of lumbar region: Secondary | ICD-10-CM | POA: Diagnosis not present

## 2016-11-01 DIAGNOSIS — M5416 Radiculopathy, lumbar region: Secondary | ICD-10-CM | POA: Diagnosis not present

## 2016-11-06 DIAGNOSIS — D485 Neoplasm of uncertain behavior of skin: Secondary | ICD-10-CM | POA: Diagnosis not present

## 2016-11-06 DIAGNOSIS — L905 Scar conditions and fibrosis of skin: Secondary | ICD-10-CM | POA: Diagnosis not present

## 2016-11-06 DIAGNOSIS — L570 Actinic keratosis: Secondary | ICD-10-CM | POA: Diagnosis not present

## 2016-11-08 DIAGNOSIS — M9903 Segmental and somatic dysfunction of lumbar region: Secondary | ICD-10-CM | POA: Diagnosis not present

## 2016-11-08 DIAGNOSIS — M5416 Radiculopathy, lumbar region: Secondary | ICD-10-CM | POA: Diagnosis not present

## 2016-11-08 DIAGNOSIS — M9901 Segmental and somatic dysfunction of cervical region: Secondary | ICD-10-CM | POA: Diagnosis not present

## 2016-11-08 DIAGNOSIS — M5033 Other cervical disc degeneration, cervicothoracic region: Secondary | ICD-10-CM | POA: Diagnosis not present

## 2016-11-15 DIAGNOSIS — M5416 Radiculopathy, lumbar region: Secondary | ICD-10-CM | POA: Diagnosis not present

## 2016-11-15 DIAGNOSIS — M9903 Segmental and somatic dysfunction of lumbar region: Secondary | ICD-10-CM | POA: Diagnosis not present

## 2016-11-15 DIAGNOSIS — M5033 Other cervical disc degeneration, cervicothoracic region: Secondary | ICD-10-CM | POA: Diagnosis not present

## 2016-11-15 DIAGNOSIS — M9901 Segmental and somatic dysfunction of cervical region: Secondary | ICD-10-CM | POA: Diagnosis not present

## 2016-11-16 DIAGNOSIS — E1165 Type 2 diabetes mellitus with hyperglycemia: Secondary | ICD-10-CM | POA: Diagnosis not present

## 2016-11-16 DIAGNOSIS — Z794 Long term (current) use of insulin: Secondary | ICD-10-CM | POA: Diagnosis not present

## 2016-11-16 LAB — LIPID PANEL
CHOLESTEROL: 119 (ref 0–200)
HDL: 54 (ref 35–70)
LDL CALC: 52
TRIGLYCERIDES: 67 (ref 40–160)

## 2016-11-22 DIAGNOSIS — M5416 Radiculopathy, lumbar region: Secondary | ICD-10-CM | POA: Diagnosis not present

## 2016-11-22 DIAGNOSIS — Z794 Long term (current) use of insulin: Secondary | ICD-10-CM | POA: Diagnosis not present

## 2016-11-22 DIAGNOSIS — M9901 Segmental and somatic dysfunction of cervical region: Secondary | ICD-10-CM | POA: Diagnosis not present

## 2016-11-22 DIAGNOSIS — E1165 Type 2 diabetes mellitus with hyperglycemia: Secondary | ICD-10-CM | POA: Diagnosis not present

## 2016-11-22 DIAGNOSIS — M5033 Other cervical disc degeneration, cervicothoracic region: Secondary | ICD-10-CM | POA: Diagnosis not present

## 2016-11-22 DIAGNOSIS — M9903 Segmental and somatic dysfunction of lumbar region: Secondary | ICD-10-CM | POA: Diagnosis not present

## 2016-11-27 DIAGNOSIS — M5416 Radiculopathy, lumbar region: Secondary | ICD-10-CM | POA: Diagnosis not present

## 2016-11-27 DIAGNOSIS — M5033 Other cervical disc degeneration, cervicothoracic region: Secondary | ICD-10-CM | POA: Diagnosis not present

## 2016-11-27 DIAGNOSIS — M9901 Segmental and somatic dysfunction of cervical region: Secondary | ICD-10-CM | POA: Diagnosis not present

## 2016-11-27 DIAGNOSIS — M9903 Segmental and somatic dysfunction of lumbar region: Secondary | ICD-10-CM | POA: Diagnosis not present

## 2016-11-28 DIAGNOSIS — W5501XA Bitten by cat, initial encounter: Secondary | ICD-10-CM | POA: Diagnosis not present

## 2016-11-28 DIAGNOSIS — L03119 Cellulitis of unspecified part of limb: Secondary | ICD-10-CM | POA: Diagnosis not present

## 2016-12-07 DIAGNOSIS — M5416 Radiculopathy, lumbar region: Secondary | ICD-10-CM | POA: Diagnosis not present

## 2016-12-07 DIAGNOSIS — M9903 Segmental and somatic dysfunction of lumbar region: Secondary | ICD-10-CM | POA: Diagnosis not present

## 2016-12-07 DIAGNOSIS — M5033 Other cervical disc degeneration, cervicothoracic region: Secondary | ICD-10-CM | POA: Diagnosis not present

## 2016-12-07 DIAGNOSIS — M9901 Segmental and somatic dysfunction of cervical region: Secondary | ICD-10-CM | POA: Diagnosis not present

## 2016-12-11 DIAGNOSIS — L570 Actinic keratosis: Secondary | ICD-10-CM | POA: Diagnosis not present

## 2016-12-13 DIAGNOSIS — M5033 Other cervical disc degeneration, cervicothoracic region: Secondary | ICD-10-CM | POA: Diagnosis not present

## 2016-12-13 DIAGNOSIS — M9901 Segmental and somatic dysfunction of cervical region: Secondary | ICD-10-CM | POA: Diagnosis not present

## 2016-12-13 DIAGNOSIS — M9903 Segmental and somatic dysfunction of lumbar region: Secondary | ICD-10-CM | POA: Diagnosis not present

## 2016-12-13 DIAGNOSIS — M5416 Radiculopathy, lumbar region: Secondary | ICD-10-CM | POA: Diagnosis not present

## 2016-12-17 ENCOUNTER — Encounter: Payer: Self-pay | Admitting: Dietician

## 2016-12-17 ENCOUNTER — Encounter: Payer: Medicare Other | Attending: Family Medicine | Admitting: Dietician

## 2016-12-17 VITALS — Ht 64.0 in | Wt 197.3 lb

## 2016-12-17 DIAGNOSIS — E113299 Type 2 diabetes mellitus with mild nonproliferative diabetic retinopathy without macular edema, unspecified eye: Secondary | ICD-10-CM | POA: Diagnosis not present

## 2016-12-17 DIAGNOSIS — Z6833 Body mass index (BMI) 33.0-33.9, adult: Secondary | ICD-10-CM

## 2016-12-17 DIAGNOSIS — E1165 Type 2 diabetes mellitus with hyperglycemia: Secondary | ICD-10-CM | POA: Diagnosis not present

## 2016-12-17 DIAGNOSIS — Z794 Long term (current) use of insulin: Secondary | ICD-10-CM

## 2016-12-17 DIAGNOSIS — E119 Type 2 diabetes mellitus without complications: Secondary | ICD-10-CM

## 2016-12-17 DIAGNOSIS — E669 Obesity, unspecified: Secondary | ICD-10-CM

## 2016-12-17 DIAGNOSIS — Z713 Dietary counseling and surveillance: Secondary | ICD-10-CM | POA: Diagnosis not present

## 2016-12-17 NOTE — Progress Notes (Signed)
Medical Nutrition Therapy: Visit start time: 1330  end time: 1410 Assessment:  Diagnosis: diabetes, obesity Medical history changes: no changes per patient Psychosocial issues/ stress concerns: none  Current weight: 197.3lbs  Height: 5'4" Medications, supplement changes: reconciled list in medical record  Progress and evaluation: Patient reports ongoing improvement in blood sugar control, since change in medication from Victoza to Bydureon. She also reports diarrhea has decreased. Weight shows slight increase since previous visit -- <1lb. Ms. Wheeless has resumed regular exercise and is feeling more energetic.     Physical activity: cardio at gym 45 minutes, 3 times a week  Dietary Intake:  Usual eating pattern includes 3 meals and 0-2 snacks per day. Dining out frequency: 2-4 meals per week.  Breakfast: [place] yogurt with protein powder and berries; egg on toast, hot green  tea with ginger Snack: fruit Lunch: sandwich only rarely; usually chicken salad with lettuce; asian salad with chicken; BLT at [employer], veg plate at [employer] Snack: none recently Supper: small meal chicken tender salad, salad with nuts Snack: small peppermint patty Beverages: water, occasionally diet Coke 2-3 times a week, green tea  Nutrition Care Education: Topics covered: diabetes, weight management  Weight control: reviewed current eating pattern and changes; discussed non-diet factors affecting weight loss such as fluid loss, physical activity. Diabetes: reviewed patient's blood sugar record and related to food choices and eating pattern.   Nutritional Diagnosis:  [place]-2.2 Altered nutrition-related laboratory As related to diabetes.  As evidenced by HbA1C 8%. [place]-3.3 Overweight/obesity As related to history of excess calories and inactivity.  As evidenced by BMI 32, patient report.  Intervention: Discussion as noted above.   Patient will continue with current eating pattern and regular exercise.    No  additional follow-up scheduled at this time; encouraged her to call with any questions.  Education Materials given:  Marland Kitchen Goals/ instructions   Learner/ who was taught:  . Patient   Level of understanding: Marland Kitchen Verbalizes/ demonstrates competency   Demonstrated degree of understanding via:   Teach back Learning barriers: . None  Willingness to learn/ readiness for change: . Eager, change in progress   Monitoring and Evaluation:  Dietary intake, exercise, BG control, and body weight      follow up: prn

## 2016-12-17 NOTE — Patient Instructions (Signed)
   Continue with current eating pattern and healthy food choices.   Keep up regular exercise.

## 2016-12-20 DIAGNOSIS — M9901 Segmental and somatic dysfunction of cervical region: Secondary | ICD-10-CM | POA: Diagnosis not present

## 2016-12-20 DIAGNOSIS — M5416 Radiculopathy, lumbar region: Secondary | ICD-10-CM | POA: Diagnosis not present

## 2016-12-20 DIAGNOSIS — M5033 Other cervical disc degeneration, cervicothoracic region: Secondary | ICD-10-CM | POA: Diagnosis not present

## 2016-12-20 DIAGNOSIS — M9903 Segmental and somatic dysfunction of lumbar region: Secondary | ICD-10-CM | POA: Diagnosis not present

## 2016-12-24 DIAGNOSIS — H353132 Nonexudative age-related macular degeneration, bilateral, intermediate dry stage: Secondary | ICD-10-CM | POA: Diagnosis not present

## 2016-12-26 ENCOUNTER — Ambulatory Visit (INDEPENDENT_AMBULATORY_CARE_PROVIDER_SITE_OTHER): Payer: Medicare Other | Admitting: Family Medicine

## 2016-12-26 ENCOUNTER — Encounter: Payer: Self-pay | Admitting: Family Medicine

## 2016-12-26 VITALS — BP 130/70 | HR 74 | Temp 98.3°F | Resp 16 | Ht 64.0 in | Wt 198.1 lb

## 2016-12-26 DIAGNOSIS — B373 Candidiasis of vulva and vagina: Secondary | ICD-10-CM

## 2016-12-26 DIAGNOSIS — W5501XD Bitten by cat, subsequent encounter: Secondary | ICD-10-CM

## 2016-12-26 DIAGNOSIS — B372 Candidiasis of skin and nail: Secondary | ICD-10-CM | POA: Diagnosis not present

## 2016-12-26 DIAGNOSIS — B3731 Acute candidiasis of vulva and vagina: Secondary | ICD-10-CM

## 2016-12-26 DIAGNOSIS — S61452D Open bite of left hand, subsequent encounter: Secondary | ICD-10-CM

## 2016-12-26 DIAGNOSIS — R21 Rash and other nonspecific skin eruption: Secondary | ICD-10-CM

## 2016-12-26 MED ORDER — TRIAMCINOLONE ACETONIDE 0.1 % EX CREA: 1.0000 "application " | TOPICAL_CREAM | Freq: Two times a day (BID) | CUTANEOUS | 0 refills | Status: DC

## 2016-12-26 MED ORDER — NYSTATIN 100000 UNIT/GM EX CREA: 1.0000 "application " | TOPICAL_CREAM | Freq: Two times a day (BID) | CUTANEOUS | 0 refills | Status: DC

## 2016-12-26 MED ORDER — FLUCONAZOLE 150 MG PO TABS: 150.0000 mg | ORAL_TABLET | Freq: Once | ORAL | 1 refills | Status: AC

## 2016-12-26 NOTE — Patient Instructions (Addendum)
Skin Yeast Infection Skin yeast infection is a condition in which there is an overgrowth of yeast (candida) that normally lives on the skin. This condition usually occurs in areas of the skin that are constantly warm and moist, such as the armpits or the groin. What are the causes? This condition is caused by a change in the normal balance of the yeast and bacteria that live on the skin. What increases the risk? This condition is more likely to develop in:  People who are obese.  Pregnant women.  Women who take birth control pills.  People who have diabetes.  People who take antibiotic medicines.  People who take steroid medicines.  People who are malnourished.  People who have a weak defense (immune) system.  People who are 65 years of age or older.  What are the signs or symptoms? Symptoms of this condition include:  A red, swollen area of the skin.  Bumps on the skin.  Itchiness.  How is this diagnosed? This condition is diagnosed with a medical history and physical exam. Your health care provider may check for yeast by taking light scrapings of the skin to be viewed under a microscope. How is this treated? This condition is treated with medicine. Medicines may be prescribed or be available over-the-counter. The medicines may be:  Taken by mouth (orally).  Applied as a cream.  Follow these instructions at home:  Take or apply over-the-counter and prescription medicines only as told by your health care provider.  Eat more yogurt. This may help to keep your yeast infection from returning.  Maintain a healthy weight. If you need help losing weight, talk with your health care provider.  Keep your skin clean and dry.  If you have diabetes, keep your blood sugar under control. Contact a health care provider if:  Your symptoms go away and then return.  Your symptoms do not get better with treatment.  Your symptoms get worse.  Your rash spreads.  You have a  fever or chills.  You have new symptoms.  You have new warmth or redness of your skin. This information is not intended to replace advice given to you by your health care provider. Make sure you discuss any questions you have with your health care provider. Document Released: 03/06/2011 Document Revised: 02/12/2016 Document Reviewed: 12/20/2014 Elsevier Interactive Patient Education  2018 [employer]  

## 2016-12-26 NOTE — Progress Notes (Addendum)
Name: Bonnie Rowland   MRN: [id number]    DOB: [birth year]   Date:12/26/2016       Progress Note  Subjective  Chief Complaint  Chief Complaint  Patient presents with  . Rash    possible shingles, red, itching a little pain for 3 days.Husband has shingles  . Follow-up    Was [facility] for cat bite, hand was swollen and red    HPI  Rash: Pt presents with red, itchy rash to the left antecubital, bilateral popliteal fossas, and bilateral groin area, and right breast. Pt has diabetes and recent history of Augmentin use. She also notes she has been prone do yeast infections in the past.  No fevers/chills, NVD, headaches, recent known insect bites, night sweats, weakness/fatigue, myalgias/arthralgias, chest pain or shortness of breath. No new products in use and no new foods consumed that patient can ID.  Vaginal Yeast Infection:  She was recently treated with Augmentin for a cat bite to the left hand while away on vacation.  She is now having vaginal burning and itching and small amount of white discharge. She states she is prone to vaginal yeast infections after antibiotic use.  Cat Bite: Patient wants to follow up for surveillance of cat bite to Left hand. She was put on 14 day course of Augmentin which she has since completed. She notes swelling and redness have gone away and the hand is feeling back to normal.   Patient Active Problem List   Diagnosis Date Noted  . Bilateral carotid bruits 08/24/2015  . 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 Atrial flutter ([place]) 02/22/2015  . Chronic pain 01/10/2015  . 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 Fatigue 01/10/2015  . History of melanoma excision 01/10/2015  . Adiposity 01/10/2015  . 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 Spinal stenosis, lumbar region, 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  . DDD (degenerative disc  disease), lumbar 11/14/2014  . Greater trochanteric bursitis of both hips 11/14/2014  . Piriformis syndrome 11/14/2014  . 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 Hyperlipemia 11/07/2009  . Hypertension, benign 11/07/2009  . Atherosclerosis of renal artery ([place]) 11/07/2009  . Perennial allergic rhinitis 11/27/2007  . Lumbosacral neuritis 01/17/2007    Social History  Substance Use Topics  . Smoking status: Never Smoker  . Smokeless tobacco: Never Used  . Alcohol use 0.0 oz/week     Comment: occ     Current Outpatient Prescriptions:  .  ALFALFA PO, Take 3 tablets by mouth every morning., Disp: , Rfl:  .  aspirin 81 MG EC tablet, Take 81 mg by mouth at bedtime. , Disp: , Rfl:  .  B Complex-C (B-COMPLEX WITH VITAMIN C) tablet, Take 2 tablets by mouth at bedtime. , Disp: , Rfl:  .  BLACK ELDERBERRY,BERRY-FLOWER, PO, Take by mouth., Disp: , Rfl:  .  calcium carbonate (OS-CAL) 600 MG TABS tablet, Take 600 mg by mouth daily., Disp: , Rfl:  .  cholecalciferol (VITAMIN D) 1000 UNITS tablet, Take 4,000 Units by mouth daily. , Disp: , Rfl:  .  clobetasol ointment (TEMOVATE) 5.32 %, Apply 1 application topically 2 (two) times daily as needed (bug bites). Reported on 06/30/2015, Disp: , Rfl:  .  Coenzyme Q10 (COQ10) 200 MG CAPS, Take 1 tablet by mouth daily. 400 mg every am., Disp: , Rfl:  .  Continuous Blood Gluc Sensor ([place])  MISC, by Does not apply route., Disp: , Rfl:  .  estradiol (ESTRACE VAGINAL) 0.1 MG/GM vaginal cream, 1/2 gm once weekly using applicator, apply blueberry sized amount of cream using tip of finger to urethra twice weekly, Disp: 50 g, Rfl: 4 .  Exenatide ER 2 MG SRER, Inject into the skin., Disp: , Rfl:  .  Eyelid Cleansers (AVENOVA) 0.01 % SOLN, See admin instructions., Disp: , Rfl: 3 .  fish oil-omega-3 fatty acids 1000 MG capsule, Take 2 g by mouth daily.  , Disp: , Rfl:  .  fluticasone (FLONASE) 50 MCG/ACT nasal spray, PLACE 2 SPRAYS INTO BOTH  NOSTRILS DAILY., Disp: 8.5 g, Rfl: 2 .  GARLIC 6160 PO, Take 1 tablet by mouth daily. 1 gram, Disp: , Rfl:  .  Glucosamine 500 MG TABS, Take 1 tablet by mouth daily. , Disp: , Rfl:  .  glucose blood (ONE TOUCH ULTRA TEST) test strip, 1 each by Other route 3 (three) times daily. Use as instructed, Disp: 300 each, Rfl: 1 .  Insulin Glargine (LANTUS SOLOSTAR) 100 UNIT/ML Solostar Pen, Inject 24 Units into the skin every morning., Disp: 15 pen, Rfl: 2 .  insulin lispro (HUMALOG) 100 UNIT/ML KiwkPen, Inject into the skin., Disp: , Rfl:  .  Insulin Pen Needle 32G X 4 MM MISC, 1 pen by Does not apply route 2 (two) times daily. Use as directed 2 times daily, Disp: 200 each, Rfl: 2 .  Lancets (ONETOUCH ULTRASOFT) lancets, Use as instructed, Disp: 100 each, Rfl: 12 .  LECITHIN PO, Take 1 capsule by mouth every other day., Disp: , Rfl:  .  loratadine (CLARITIN) 10 MG tablet, Take 1 tablet (10 mg total) by mouth daily., Disp: 30 tablet, Rfl: 11 .  losartan-hydrochlorothiazide (HYZAAR) 100-25 MG tablet, Take 1 tablet by mouth daily., Disp: 90 tablet, Rfl: 1 .  MAGNESIUM-POTASSIUM PO, Take 1 tablet by mouth 2 (two) times daily. Reported on 06/30/2015, Disp: , Rfl:  .  metoprolol succinate (TOPROL-XL) 25 MG 24 hr tablet, TAKE 1 TABLET (25 MG TOTAL) BY MOUTH DAILY., Disp: 90 tablet, Rfl: 3 .  Multiple Vitamins-Minerals (PRESERVISION AREDS 2) CAPS, Take 1 capsule by mouth 2 (two) times daily., Disp: , Rfl:  .  nateglinide (STARLIX) 60 MG tablet, Take 1 tablet (60 mg total) by mouth 3 (three) times daily with meals., Disp: 90 tablet, Rfl: 2 .  nystatin-triamcinolone ointment (MYCOLOG), Apply 1 application topically 2 (two) times daily., Disp: 30 g, Rfl: 3 .  Probiotic Product (PROBIOTIC PO), Take 1 Dose by mouth at bedtime., Disp: , Rfl:  .  Propylene Glycol (SYSTANE BALANCE OP), Apply 1 drop to eye as needed., Disp: , Rfl:  .  raloxifene (EVISTA) 60 MG tablet, Take 1 tablet (60 mg total) by mouth every morning.,  Disp: 90 tablet, Rfl: 3 .  rosuvastatin (CRESTOR) 10 MG tablet, Take 1 tablet (10 mg total) by mouth daily., Disp: 90 tablet, Rfl: 1 .  sodium fluoride (DENTAGEL) 1.1 % GEL dental gel, , Disp: , Rfl:  .  sulfacetamide (BLEPH-10) 10 % ophthalmic solution, Place 1 drop into both eyes 3 (three) times daily as needed., Disp: , Rfl:  .  vitamin E (VITAMIN E) 400 UNIT capsule, Take 400 Units by mouth daily. Every am, Disp: , Rfl:  .  fluconazole (DIFLUCAN) 150 MG tablet, Take 1 tablet (150 mg total) by mouth once., Disp: 2 tablet, Rfl: 1 .  nystatin cream (MYCOSTATIN), Apply 1 application topically 2 (two) times daily., Disp:  30 g, Rfl: 0 .  triamcinolone cream (KENALOG) 0.1 %, Apply 1 application topically 2 (two) times daily., Disp: 45 g, Rfl: 0  Allergies  Allergen Reactions  . Cyclobenzaprine Hypertension  . Cheese Other (See Comments)    bloating  . Ciprofloxacin Hcl Other (See Comments)    Muscle pain  . Coconut Oil     ROS  Constitutional: Negative for fever or weight change.  Respiratory: Negative for cough and shortness of breath.   Cardiovascular: Negative for chest pain or palpitations.  Gastrointestinal: Negative for abdominal pain, no bowel changes.  GU: See HPI. No dysuria or polyuria. Musculoskeletal: Negative for gait problem or joint swelling.  Skin: See HPI. Neurological: Negative for dizziness or headache.  No other specific complaints in a complete review of systems (except as listed in HPI above).  Objective  Vitals:   12/26/16 0857  BP: 130/70  Pulse: 74  Resp: 16  Temp: 98.3 F (36.8 C)  TempSrc: Oral  SpO2: 93%  Weight: 198 lb 1.6 oz (89.9 kg)  Height: 5\' 4"  (1.626 m)   Body mass index is 34 kg/m.  Nursing Note and Vital Signs reviewed.  Physical Exam  Constitutional: Patient appears well-developed and well-nourished. Obese No distress.  HEENT: head atraumatic, normocephalic Cardiovascular: Normal rate, regular rhythm, S1/S2 present.  No murmur  or rub heard. No BLE edema. Pulmonary/Chest: Effort normal and breath sounds clear. No respiratory distress or retractions. Psychiatric: Patient has a normal mood and affect. behavior is normal. Judgment and thought content normal. GU: Deferred. Skin: Erythematous macular rash to Left AC, bilateral popliteal fossas, anterior right breast, small areas to bilateral groin, and to central low back. Some scaling and excoriation noted. No bleeding or exudate. MSK: Left hand has full AROM, no swelling, erythema, or puncture wound noted.  No results found for this or any previous visit (from the past 2160 hour(s)).   Assessment & Plan  1. Vaginal candidiasis - fluconazole (DIFLUCAN) 150 MG tablet; Take 1 tablet (150 mg total) by mouth once.  Dispense: 2 tablet; Refill: 1 - Patient verbalizes understanding to take 1 pill, wait 72 hours, and repeat dose if not improving.  2. Candida infection of flexural skin - nystatin cream (MYCOSTATIN); Apply 1 application topically 2 (two) times daily.  Dispense: 30 g; Refill: 0 - triamcinolone cream (KENALOG) 0.1 %; Apply 1 application topically 2 (two) times daily.  Dispense: 45 g; Refill: 0  3. Rash of body - triamcinolone cream (KENALOG) 0.1 %; Apply 1 application topically 2 (two) times daily.  Dispense: 45 g; Refill: 0  4. Cat bite of left hand, subsequent encounter [facility]  Patient will schedule follow up in 5-7 days if rash is not improving, sooner if worsening.  -Red flags and when to present for emergency care or RTC including fever >101.40F, chest pain, shortness of breath, new/worsening/un-resolving symptoms, bleeding, symptoms un-improving with medication provided, myalgias/arthralgias, reviewed with patient at time of visit. Follow up and care instructions discussed and provided in AVS. -Reviewed Health Maintenance: Has regular follow up with PCP in August.  I have reviewed this encounter including the documentation in this note and/or discussed  this patient with the Johney Maine, FNP, NP-C. I am certifying that I agree with the content of this note as supervising physician.  Steele Sizer, MD [facility] 12/30/2016, 5:39 PM

## 2016-12-27 DIAGNOSIS — M5033 Other cervical disc degeneration, cervicothoracic region: Secondary | ICD-10-CM | POA: Diagnosis not present

## 2016-12-27 DIAGNOSIS — M5416 Radiculopathy, lumbar region: Secondary | ICD-10-CM | POA: Diagnosis not present

## 2016-12-27 DIAGNOSIS — M9903 Segmental and somatic dysfunction of lumbar region: Secondary | ICD-10-CM | POA: Diagnosis not present

## 2016-12-27 DIAGNOSIS — M9901 Segmental and somatic dysfunction of cervical region: Secondary | ICD-10-CM | POA: Diagnosis not present

## 2016-12-27 DIAGNOSIS — H01003 Unspecified blepharitis right eye, unspecified eyelid: Secondary | ICD-10-CM | POA: Diagnosis not present

## 2017-01-04 ENCOUNTER — Encounter: Payer: Self-pay | Admitting: Family Medicine

## 2017-01-04 ENCOUNTER — Ambulatory Visit (INDEPENDENT_AMBULATORY_CARE_PROVIDER_SITE_OTHER): Payer: Medicare Other | Admitting: Family Medicine

## 2017-01-04 VITALS — BP 128/72 | HR 77 | Temp 97.6°F | Resp 16 | Ht 64.0 in | Wt 195.1 lb

## 2017-01-04 DIAGNOSIS — B372 Candidiasis of skin and nail: Secondary | ICD-10-CM | POA: Diagnosis not present

## 2017-01-04 DIAGNOSIS — B3731 Acute candidiasis of vulva and vagina: Secondary | ICD-10-CM

## 2017-01-04 DIAGNOSIS — M5416 Radiculopathy, lumbar region: Secondary | ICD-10-CM | POA: Diagnosis not present

## 2017-01-04 DIAGNOSIS — S61452D Open bite of left hand, subsequent encounter: Secondary | ICD-10-CM | POA: Diagnosis not present

## 2017-01-04 DIAGNOSIS — M9901 Segmental and somatic dysfunction of cervical region: Secondary | ICD-10-CM | POA: Diagnosis not present

## 2017-01-04 DIAGNOSIS — E1165 Type 2 diabetes mellitus with hyperglycemia: Secondary | ICD-10-CM

## 2017-01-04 DIAGNOSIS — E113299 Type 2 diabetes mellitus with mild nonproliferative diabetic retinopathy without macular edema, unspecified eye: Secondary | ICD-10-CM | POA: Diagnosis not present

## 2017-01-04 DIAGNOSIS — M9903 Segmental and somatic dysfunction of lumbar region: Secondary | ICD-10-CM | POA: Diagnosis not present

## 2017-01-04 DIAGNOSIS — W5501XD Bitten by cat, subsequent encounter: Secondary | ICD-10-CM | POA: Diagnosis not present

## 2017-01-04 DIAGNOSIS — B373 Candidiasis of vulva and vagina: Secondary | ICD-10-CM | POA: Diagnosis not present

## 2017-01-04 DIAGNOSIS — M5033 Other cervical disc degeneration, cervicothoracic region: Secondary | ICD-10-CM | POA: Diagnosis not present

## 2017-01-04 MED ORDER — FLUCONAZOLE 150 MG PO TABS: 150.0000 mg | ORAL_TABLET | Freq: Once | ORAL | 0 refills | Status: AC

## 2017-01-04 NOTE — Progress Notes (Addendum)
Name: Bonnie Rowland   MRN: [id number]    DOB: 05-Nov-1940   Date:01/04/2017       Progress Note  Subjective  Chief Complaint  Chief Complaint  Patient presents with  . Follow-up    shingles, feels better, but a few new spots    HPI  Pt presents to follow up on rash and vaginal yeast infection:  Rash: Pt presents to follow up on tinea corporis to the left antecubital, bilateral popliteal fossas, and bilateral groin area, and right breast.  All but the RIGHT groin area have cleared, and one new spot appeared on right lower abdomen yesterday, so she refilled the fluconazole and took 1 pill yesterday.  Pt has diabetes and recent history of Augmentin use. She has been prone to yeast infections in the past.  Feeling much better, significantly less itchy now.   Vaginal Yeast Infection:  She was recently treated with Augmentin for a cat bite to the left hand and developed a secondary yeast infection. She denies vaginal discharge or itching - says fluconazole made a very big difference. She requests to have 1 additional diflucan on hand to take in the event of a new yeast infection.  - Patient has T2DM, and her most recent A1C was 10.3%. She has been working diligently with endocrinology and nutrition to lower her A1C, but she is still having BG's in the 150's.  She is aware that elevate BG's can contribute to risk of yeast infection.  Cat Bite: Patient also wants to follow up for surveillance of cat bite to Left hand. She was put on 14 day course of Augmentin which she has since completed. She notes swelling and redness have gone away. She says the area itches every now and then but is otherwise doing well.  Patient Active Problem List   Diagnosis Date Noted  . Bilateral carotid bruits 08/24/2015  . 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 Atrial flutter ([place]) 02/22/2015  . Chronic pain 01/10/2015  . Type 2 diabetes, uncontrolled, with mild nonproliferative  retinopathy without macular edema (HCC) 01/10/2015  . Fatigue 01/10/2015  . History of melanoma excision 01/10/2015  . Adiposity 01/10/2015  . 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 Spinal stenosis, lumbar region, 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  . DDD (degenerative disc disease), lumbar 11/14/2014  . Greater trochanteric bursitis of both hips 11/14/2014  . Piriformis syndrome 11/14/2014  . 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 Hyperlipemia 11/07/2009  . Hypertension, benign 11/07/2009  . Atherosclerosis of renal artery ([place]) 11/07/2009  . Perennial allergic rhinitis 11/27/2007  . Lumbosacral neuritis 01/17/2007    Social History  Substance Use Topics  . Smoking status: Never Smoker  . Smokeless tobacco: Never Used  . Alcohol use 0.0 oz/week     Comment: occ     Current Outpatient Prescriptions:  .  ALFALFA PO, Take 3 tablets by mouth every morning., Disp: , Rfl:  .  aspirin 81 MG EC tablet, Take 81 mg by mouth at bedtime. , Disp: , Rfl:  .  B Complex-C (B-COMPLEX WITH VITAMIN C) tablet, Take 2 tablets by mouth at bedtime. , Disp: , Rfl:  .  BLACK ELDERBERRY,BERRY-FLOWER, PO, Take by mouth., Disp: , Rfl:  .  calcium carbonate (OS-CAL) 600 MG TABS tablet, Take 600 mg by mouth daily., Disp: , Rfl:  .  cholecalciferol (VITAMIN D) 1000 UNITS tablet, Take 4,000 Units by mouth daily. , Disp: , Rfl:  .  clobetasol ointment (  TEMOVATE) 8.09 %, Apply 1 application topically 2 (two) times daily as needed (bug bites). Reported on 06/30/2015, Disp: , Rfl:  .  Coenzyme Q10 (COQ10) 200 MG CAPS, Take 1 tablet by mouth daily. 400 mg every am., Disp: , Rfl:  .  Continuous Blood Gluc Sensor ([place]) MISC, by Does not apply route., Disp: , Rfl:  .  estradiol (ESTRACE VAGINAL) 0.1 MG/GM vaginal cream, 1/2 gm once weekly using applicator, apply blueberry sized amount of cream using tip of finger to urethra twice weekly, Disp: 50 g, Rfl: 4 .  Exenatide ER 2 MG SRER, Inject into  the skin., Disp: , Rfl:  .  Eyelid Cleansers (AVENOVA) 0.01 % SOLN, See admin instructions., Disp: , Rfl: 3 .  fish oil-omega-3 fatty acids 1000 MG capsule, Take 2 g by mouth daily.  , Disp: , Rfl:  .  fluticasone (FLONASE) 50 MCG/ACT nasal spray, PLACE 2 SPRAYS INTO BOTH NOSTRILS DAILY., Disp: 8.5 g, Rfl: 2 .  GARLIC 9833 PO, Take 1 tablet by mouth daily. 1 gram, Disp: , Rfl:  .  Glucosamine 500 MG TABS, Take 1 tablet by mouth daily. , Disp: , Rfl:  .  glucose blood (ONE TOUCH ULTRA TEST) test strip, 1 each by Other route 3 (three) times daily. Use as instructed, Disp: 300 each, Rfl: 1 .  Insulin Glargine (LANTUS SOLOSTAR) 100 UNIT/ML Solostar Pen, Inject 24 Units into the skin every morning., Disp: 15 pen, Rfl: 2 .  insulin lispro (HUMALOG) 100 UNIT/ML KiwkPen, Inject into the skin., Disp: , Rfl:  .  Insulin Pen Needle 32G X 4 MM MISC, 1 pen by Does not apply route 2 (two) times daily. Use as directed 2 times daily, Disp: 200 each, Rfl: 2 .  Lancets (ONETOUCH ULTRASOFT) lancets, Use as instructed, Disp: 100 each, Rfl: 12 .  LECITHIN PO, Take 1 capsule by mouth every other day., Disp: , Rfl:  .  loratadine (CLARITIN) 10 MG tablet, Take 1 tablet (10 mg total) by mouth daily., Disp: 30 tablet, Rfl: 11 .  losartan-hydrochlorothiazide (HYZAAR) 100-25 MG tablet, Take 1 tablet by mouth daily., Disp: 90 tablet, Rfl: 1 .  MAGNESIUM-POTASSIUM PO, Take 1 tablet by mouth 2 (two) times daily. Reported on 06/30/2015, Disp: , Rfl:  .  metoprolol succinate (TOPROL-XL) 25 MG 24 hr tablet, TAKE 1 TABLET (25 MG TOTAL) BY MOUTH DAILY., Disp: 90 tablet, Rfl: 3 .  Multiple Vitamins-Minerals (PRESERVISION AREDS 2) CAPS, Take 1 capsule by mouth 2 (two) times daily., Disp: , Rfl:  .  nateglinide (STARLIX) 60 MG tablet, Take 1 tablet (60 mg total) by mouth 3 (three) times daily with meals., Disp: 90 tablet, Rfl: 2 .  nystatin cream (MYCOSTATIN), Apply 1 application topically 2 (two) times daily., Disp: 30 g, Rfl: 0 .   nystatin-triamcinolone ointment (MYCOLOG), Apply 1 application topically 2 (two) times daily., Disp: 30 g, Rfl: 3 .  Probiotic Product (PROBIOTIC PO), Take 1 Dose by mouth at bedtime., Disp: , Rfl:  .  Propylene Glycol (SYSTANE BALANCE OP), Apply 1 drop to eye as needed., Disp: , Rfl:  .  raloxifene (EVISTA) 60 MG tablet, Take 1 tablet (60 mg total) by mouth every morning., Disp: 90 tablet, Rfl: 3 .  rosuvastatin (CRESTOR) 10 MG tablet, Take 1 tablet (10 mg total) by mouth daily., Disp: 90 tablet, Rfl: 1 .  sodium fluoride (DENTAGEL) 1.1 % GEL dental gel, , Disp: , Rfl:  .  sulfacetamide (BLEPH-10) 10 % ophthalmic solution,  Place 1 drop into both eyes 3 (three) times daily as needed., Disp: , Rfl:  .  triamcinolone cream (KENALOG) 0.1 %, Apply 1 application topically 2 (two) times daily., Disp: 45 g, Rfl: 0 .  vitamin E (VITAMIN E) 400 UNIT capsule, Take 400 Units by mouth daily. Every am, Disp: , Rfl:   Allergies  Allergen Reactions  . Cyclobenzaprine Hypertension  . Cheese Other (See Comments)    bloating  . Ciprofloxacin Hcl Other (See Comments)    Muscle pain  . Coconut Oil     ROS  Constitutional: Negative for fever or weight change.  Respiratory: Negative for cough and shortness of breath.   Cardiovascular: Negative for chest pain or palpitations.  Gastrointestinal: Negative for abdominal pain, no bowel changes.  Musculoskeletal: Negative for gait problem or joint swelling.  Skin: Negative for rash.  Neurological: Negative for dizziness or headache.  No other specific complaints in a complete review of systems (except as listed in HPI above).  Objective  Vitals:   01/04/17 0814  BP: 128/72  Pulse: 77  Resp: 16  Temp: 97.6 F (36.4 C)  TempSrc: Oral  SpO2: 96%  Weight: 195 lb 1.6 oz (88.5 kg)  Height: 5\' 4"  (1.626 m)   Body mass index is 33.49 kg/m.  Nursing Note and Vital Signs reviewed.  Physical Exam  Constitutional: Patient appears well-developed and  well-nourished. Obese No distress.  HEENT: head atraumatic, normocephalic Cardiovascular: Normal rate, regular rhythm, S1/S2 present.  No murmur or rub heard. No BLE edema. Pulmonary/Chest: Effort normal and breath sounds clear. No respiratory distress or retractions. Psychiatric: Patient has a normal mood and affect. behavior is normal. Judgment and thought content normal. Skin: 2 Small patches of erythematous macular rash to RIGHT groin and right lower abdomen, appearing to be in the beginning stages of healing.  No bleeding or exudate.  One small puncture to left hand remains, but is in later stages of healing - no underlying erythema. MSK: Left hand has full AROM, no swelling, erythema, or puncture wound noted.  No results found for this or any previous visit (from the past 2160 hour(s)).  Assessment & Plan  1. Candida infection of flexural skin - Continue nystatin and triamcinolone creams BID as needed.  Return if symptoms do not resolve completely in the next 1-2 weeks.  2. Vaginal candidiasis - fluconazole (DIFLUCAN) 150 MG tablet; Take 1 tablet (150 mg total) by mouth once.  Dispense: 1 tablet; Refill: 0 - Advised patient to keep refill at pharmacy unless needed for new onset yeast infections as she is quite prone to them. Advised that if she does develop a new vaginal yeast infection and it does not improve with this dose, she needs to come in for a follow-up appointment.  3. Cat bite of left hand, subsequent encounter [facility], doing well. Continue good hand hygiene.   4. Uncontrolled type 2 diabetes mellitus 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, without long-term current use of insulin, unspecified laterality ([place]) - Most recent A1C was 10.3%. We discussed how this contributes to her being more prone to yeast infections and patient voices undertanding. 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   -Red flags  and when to present for emergency care or RTC including fever >101.45F, chest pain, shortness of breath, new/worsening/un-resolving symptoms, chills, increased redness/red streaking, drainage/bleeding reviewed with patient at time of visit. Follow up and care instructions discussed and provided in AVS. Return  if symptoms worsen or fail to improve.   I have reviewed this encounter including the documentation in this note and/or discussed this patient with the Johney Maine, FNP, NP-C. I am certifying that I agree with the content of this note as supervising physician.  Steele Sizer, MD [facility] 01/06/2017, 9:45 PM

## 2017-01-10 ENCOUNTER — Encounter: Payer: Self-pay | Admitting: Family Medicine

## 2017-01-10 ENCOUNTER — Ambulatory Visit (INDEPENDENT_AMBULATORY_CARE_PROVIDER_SITE_OTHER): Payer: Medicare Other | Admitting: Family Medicine

## 2017-01-10 VITALS — BP 124/70 | HR 78 | Temp 98.0°F | Resp 16 | Ht 64.0 in | Wt 197.4 lb

## 2017-01-10 DIAGNOSIS — E113299 Type 2 diabetes mellitus with mild nonproliferative diabetic retinopathy without macular edema, unspecified eye: Secondary | ICD-10-CM

## 2017-01-10 DIAGNOSIS — B372 Candidiasis of skin and nail: Secondary | ICD-10-CM

## 2017-01-10 DIAGNOSIS — E1165 Type 2 diabetes mellitus with hyperglycemia: Secondary | ICD-10-CM | POA: Diagnosis not present

## 2017-01-10 NOTE — Progress Notes (Addendum)
Name: Bonnie Rowland   MRN: [id number]    DOB: [birth year]   Date:01/10/2017       Progress Note  Subjective  Chief Complaint  Chief Complaint  Patient presents with  . Follow-up    rash is back     HPI  Pt presents with complaint of new are of candida infection to the LEFT groin; she has been seen for multiple times now for ongoing symptoms of itchy, erythematous, flaking rash in flexural areas of her body.  She has taken PO Diflucan multiple times now, and has been using Nystatin and Triamcinolone creams and she continues to have resolution for several days and then recurrence in a different area. She did call her dermatologist and was unable to get in - Sees Dr. Evorn Gong at [facility].  Has been trying to keep areas clean and dry - ensuring extra drying time to flexural areas and is using Z-sorb antifungal powder that Dr. Evorn Gong had recommended in the past.  She notes that her blood sugar has been very well controlled with highest in the 110's-130's.  Has been working out at the gym 3 times a week for several years now.  Has [facility] the [facility] for nutritionist and is working on eating a healthier diet.   Patient Active Problem List   Diagnosis Date Noted  . Bilateral carotid bruits 08/24/2015  . 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 Atrial flutter ([place]) 02/22/2015  . Chronic pain 01/10/2015  . 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 Fatigue 01/10/2015  . History of melanoma excision 01/10/2015  . Adiposity 01/10/2015  . 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 Spinal stenosis, lumbar region, 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  . DDD (degenerative disc disease), lumbar 11/14/2014  . Greater trochanteric bursitis of both hips 11/14/2014  . Piriformis syndrome 11/14/2014  . 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 Hyperlipemia 11/07/2009  . Hypertension,  benign 11/07/2009  . Atherosclerosis of renal artery ([place]) 11/07/2009  . Perennial allergic rhinitis 11/27/2007  . Lumbosacral neuritis 01/17/2007    Social History  Substance Use Topics  . Smoking status: Never Smoker  . Smokeless tobacco: Never Used  . Alcohol use 0.0 oz/week     Comment: occ    Current Outpatient Prescriptions:  .  ALFALFA PO, Take 3 tablets by mouth every morning., Disp: , Rfl:  .  aspirin 81 MG EC tablet, Take 81 mg by mouth at bedtime. , Disp: , Rfl:  .  B Complex-C (B-COMPLEX WITH VITAMIN C) tablet, Take 2 tablets by mouth at bedtime. , Disp: , Rfl:  .  BLACK ELDERBERRY,BERRY-FLOWER, PO, Take by mouth., Disp: , Rfl:  .  calcium carbonate (OS-CAL) 600 MG TABS tablet, Take 600 mg by mouth daily., Disp: , Rfl:  .  cholecalciferol (VITAMIN D) 1000 UNITS tablet, Take 4,000 Units by mouth daily. , Disp: , Rfl:  .  clobetasol ointment (TEMOVATE) 6.57 %, Apply 1 application topically 2 (two) times daily as needed (bug bites). Reported on 06/30/2015, Disp: , Rfl:  .  Coenzyme Q10 (COQ10) 200 MG CAPS, Take 1 tablet by mouth daily. 400 mg every am., Disp: , Rfl:  .  Continuous Blood Gluc Sensor ([place]) MISC, by Does not apply route., Disp: , Rfl:  .  estradiol (ESTRACE VAGINAL) 0.1 MG/GM vaginal cream, 1/2 gm once weekly using applicator, apply blueberry sized amount of cream  using tip of finger to urethra twice weekly, Disp: 50 g, Rfl: 4 .  Exenatide ER 2 MG SRER, Inject into the skin., Disp: , Rfl:  .  Eyelid Cleansers (AVENOVA) 0.01 % SOLN, See admin instructions., Disp: , Rfl: 3 .  fish oil-omega-3 fatty acids 1000 MG capsule, Take 2 g by mouth daily.  , Disp: , Rfl:  .  fluticasone (FLONASE) 50 MCG/ACT nasal spray, PLACE 2 SPRAYS INTO BOTH NOSTRILS DAILY., Disp: 8.5 g, Rfl: 2 .  GARLIC 8676 PO, Take 1 tablet by mouth daily. 1 gram, Disp: , Rfl:  .  Glucosamine 500 MG TABS, Take 1 tablet by mouth daily. , Disp: , Rfl:  .  glucose blood (ONE  TOUCH ULTRA TEST) test strip, 1 each by Other route 3 (three) times daily. Use as instructed, Disp: 300 each, Rfl: 1 .  Insulin Glargine (LANTUS SOLOSTAR) 100 UNIT/ML Solostar Pen, Inject 24 Units into the skin every morning., Disp: 15 pen, Rfl: 2 .  insulin lispro (HUMALOG) 100 UNIT/ML KiwkPen, Inject into the skin., Disp: , Rfl:  .  Insulin Pen Needle 32G X 4 MM MISC, 1 pen by Does not apply route 2 (two) times daily. Use as directed 2 times daily, Disp: 200 each, Rfl: 2 .  Lancets (ONETOUCH ULTRASOFT) lancets, Use as instructed, Disp: 100 each, Rfl: 12 .  LECITHIN PO, Take 1 capsule by mouth every other day., Disp: , Rfl:  .  loratadine (CLARITIN) 10 MG tablet, Take 1 tablet (10 mg total) by mouth daily., Disp: 30 tablet, Rfl: 11 .  losartan-hydrochlorothiazide (HYZAAR) 100-25 MG tablet, Take 1 tablet by mouth daily., Disp: 90 tablet, Rfl: 1 .  MAGNESIUM-POTASSIUM PO, Take 1 tablet by mouth 2 (two) times daily. Reported on 06/30/2015, Disp: , Rfl:  .  metoprolol succinate (TOPROL-XL) 25 MG 24 hr tablet, TAKE 1 TABLET (25 MG TOTAL) BY MOUTH DAILY., Disp: 90 tablet, Rfl: 3 .  Multiple Vitamins-Minerals (PRESERVISION AREDS 2) CAPS, Take 1 capsule by mouth 2 (two) times daily., Disp: , Rfl:  .  nateglinide (STARLIX) 60 MG tablet, Take 1 tablet (60 mg total) by mouth 3 (three) times daily with meals., Disp: 90 tablet, Rfl: 2 .  nystatin cream (MYCOSTATIN), Apply 1 application topically 2 (two) times daily., Disp: 30 g, Rfl: 0 .  Probiotic Product (PROBIOTIC PO), Take 1 Dose by mouth at bedtime., Disp: , Rfl:  .  Propylene Glycol (SYSTANE BALANCE OP), Apply 1 drop to eye as needed., Disp: , Rfl:  .  raloxifene (EVISTA) 60 MG tablet, Take 1 tablet (60 mg total) by mouth every morning., Disp: 90 tablet, Rfl: 3 .  rosuvastatin (CRESTOR) 10 MG tablet, Take 1 tablet (10 mg total) by mouth daily., Disp: 90 tablet, Rfl: 1 .  sodium fluoride (DENTAGEL) 1.1 % GEL dental gel, , Disp: , Rfl:  .  sulfacetamide  (BLEPH-10) 10 % ophthalmic solution, Place 1 drop into both eyes 3 (three) times daily as needed., Disp: , Rfl:  .  triamcinolone cream (KENALOG) 0.1 %, Apply 1 application topically 2 (two) times daily., Disp: 45 g, Rfl: 0 .  vitamin E (VITAMIN E) 400 UNIT capsule, Take 400 Units by mouth daily. Every am, Disp: , Rfl:   Allergies  Allergen Reactions  . Cyclobenzaprine Hypertension  . Cheese Other (See Comments)    bloating  . Ciprofloxacin Hcl Other (See Comments)    Muscle pain  . Coconut Oil     ROS Constitutional: Negative for fever or weight  change.  Respiratory: Negative for cough and shortness of breath.   Cardiovascular: Negative for chest pain or palpitations.  Gastrointestinal: Negative for abdominal pain, no bowel changes.  Musculoskeletal: Negative for gait problem or joint swelling.  Skin: Positive for rash.  Neurological: Negative for dizziness or headache.  No other specific complaints in a complete review of systems (except as listed in HPI above).  Objective  Vitals:   01/10/17 0959  BP: 124/70  Pulse: 78  Resp: 16  Temp: 98 F (36.7 C)  TempSrc: Oral  SpO2: 95%  Weight: 197 lb 6.4 oz (89.5 kg)  Height: 5\' 4"  (1.626 m)   Body mass index is 33.88 kg/m.  Nursing Note and Vital Signs reviewed.  Physical Exam Constitutional: Patient appears well-developed and well-nourished. Obese  No distress.  HEENT: head atraumatic, normocephalic Cardiovascular: Normal rate, regular rhythm, S1/S2 present.  No murmur or rub heard. No BLE edema. Pulmonary/Chest: Effort normal and breath sounds clear. No respiratory distress or retractions. Psychiatric: Patient has a normal mood and affect. behavior is normal. Judgment and thought content normal. Skin: Oblong patch that follows the flexural area of the left groin for approximately 10cm is erythematous with white flaking present, some white powder present from patient's application. Area is mildly tender. No bleeding or  exudate.  No results found for this or any previous visit (from the past 2160 hour(s)).   Assessment & Plan  1. Candida infection of flexural skin - Ambulatory referral to Dermatology - Advised that we will no longer use Diflucan, and that patient may continue to use topical creams as prescribed until able to be seen by dermatology  2. 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  -Red flags and when to present for emergency care or RTC including fever >101.9F,  new/worsening/un-resolving symptoms, red streaking, swelling or pus formation, reviewed with patient at time of visit. Follow up and care instructions discussed and provided in AVS.  I have reviewed this encounter including the documentation in this note and/or discussed this patient with the Johney Maine, FNP, NP-C. I am certifying that I agree with the content of this note as supervising physician.  Steele Sizer, MD [facility] 01/11/2017, 10:56 AM

## 2017-01-17 DIAGNOSIS — M5033 Other cervical disc degeneration, cervicothoracic region: Secondary | ICD-10-CM | POA: Diagnosis not present

## 2017-01-17 DIAGNOSIS — M9903 Segmental and somatic dysfunction of lumbar region: Secondary | ICD-10-CM | POA: Diagnosis not present

## 2017-01-17 DIAGNOSIS — M9901 Segmental and somatic dysfunction of cervical region: Secondary | ICD-10-CM | POA: Diagnosis not present

## 2017-01-17 DIAGNOSIS — M5416 Radiculopathy, lumbar region: Secondary | ICD-10-CM | POA: Diagnosis not present

## 2017-01-22 DIAGNOSIS — B372 Candidiasis of skin and nail: Secondary | ICD-10-CM | POA: Diagnosis not present

## 2017-01-24 DIAGNOSIS — M9903 Segmental and somatic dysfunction of lumbar region: Secondary | ICD-10-CM | POA: Diagnosis not present

## 2017-01-24 DIAGNOSIS — M5416 Radiculopathy, lumbar region: Secondary | ICD-10-CM | POA: Diagnosis not present

## 2017-01-24 DIAGNOSIS — M9901 Segmental and somatic dysfunction of cervical region: Secondary | ICD-10-CM | POA: Diagnosis not present

## 2017-01-24 DIAGNOSIS — M5033 Other cervical disc degeneration, cervicothoracic region: Secondary | ICD-10-CM | POA: Diagnosis not present

## 2017-01-31 DIAGNOSIS — M5033 Other cervical disc degeneration, cervicothoracic region: Secondary | ICD-10-CM | POA: Diagnosis not present

## 2017-01-31 DIAGNOSIS — M9903 Segmental and somatic dysfunction of lumbar region: Secondary | ICD-10-CM | POA: Diagnosis not present

## 2017-01-31 DIAGNOSIS — M9901 Segmental and somatic dysfunction of cervical region: Secondary | ICD-10-CM | POA: Diagnosis not present

## 2017-01-31 DIAGNOSIS — M5416 Radiculopathy, lumbar region: Secondary | ICD-10-CM | POA: Diagnosis not present

## 2017-02-07 DIAGNOSIS — M5033 Other cervical disc degeneration, cervicothoracic region: Secondary | ICD-10-CM | POA: Diagnosis not present

## 2017-02-07 DIAGNOSIS — M9901 Segmental and somatic dysfunction of cervical region: Secondary | ICD-10-CM | POA: Diagnosis not present

## 2017-02-07 DIAGNOSIS — M9903 Segmental and somatic dysfunction of lumbar region: Secondary | ICD-10-CM | POA: Diagnosis not present

## 2017-02-07 DIAGNOSIS — M5416 Radiculopathy, lumbar region: Secondary | ICD-10-CM | POA: Diagnosis not present

## 2017-02-14 DIAGNOSIS — M5416 Radiculopathy, lumbar region: Secondary | ICD-10-CM | POA: Diagnosis not present

## 2017-02-14 DIAGNOSIS — M5033 Other cervical disc degeneration, cervicothoracic region: Secondary | ICD-10-CM | POA: Diagnosis not present

## 2017-02-14 DIAGNOSIS — M9901 Segmental and somatic dysfunction of cervical region: Secondary | ICD-10-CM | POA: Diagnosis not present

## 2017-02-14 DIAGNOSIS — M9903 Segmental and somatic dysfunction of lumbar region: Secondary | ICD-10-CM | POA: Diagnosis not present

## 2017-02-18 ENCOUNTER — Other Ambulatory Visit: Payer: Self-pay | Admitting: Family Medicine

## 2017-02-18 ENCOUNTER — Encounter: Payer: Self-pay | Admitting: Family Medicine

## 2017-02-18 ENCOUNTER — Ambulatory Visit (INDEPENDENT_AMBULATORY_CARE_PROVIDER_SITE_OTHER): Payer: Medicare Other | Admitting: Family Medicine

## 2017-02-18 VITALS — BP 128/64 | HR 74 | Temp 97.7°F | Resp 16 | Ht 64.0 in | Wt 200.2 lb

## 2017-02-18 DIAGNOSIS — I10 Essential (primary) hypertension: Secondary | ICD-10-CM

## 2017-02-18 DIAGNOSIS — D692 Other nonthrombocytopenic purpura: Secondary | ICD-10-CM | POA: Diagnosis not present

## 2017-02-18 DIAGNOSIS — K219 Gastro-esophageal reflux disease without esophagitis: Secondary | ICD-10-CM | POA: Diagnosis not present

## 2017-02-18 DIAGNOSIS — Z794 Long term (current) use of insulin: Secondary | ICD-10-CM

## 2017-02-18 DIAGNOSIS — E1169 Type 2 diabetes mellitus with other specified complication: Secondary | ICD-10-CM

## 2017-02-18 DIAGNOSIS — I701 Atherosclerosis of renal artery: Secondary | ICD-10-CM | POA: Diagnosis not present

## 2017-02-18 DIAGNOSIS — E113293 Type 2 diabetes mellitus with mild nonproliferative diabetic retinopathy without macular edema, bilateral: Secondary | ICD-10-CM | POA: Diagnosis not present

## 2017-02-18 DIAGNOSIS — B372 Candidiasis of skin and nail: Secondary | ICD-10-CM

## 2017-02-18 DIAGNOSIS — E785 Hyperlipidemia, unspecified: Secondary | ICD-10-CM

## 2017-02-18 LAB — POCT UA - MICROALBUMIN: Microalbumin Ur, POC: 0 mg/L

## 2017-02-18 LAB — POCT GLYCOSYLATED HEMOGLOBIN (HGB A1C): HEMOGLOBIN A1C: 7

## 2017-02-18 MED ORDER — METOPROLOL SUCCINATE ER 25 MG PO TB24: 25.0000 mg | ORAL_TABLET | Freq: Every day | ORAL | 1 refills | Status: DC

## 2017-02-18 MED ORDER — RALOXIFENE HCL 60 MG PO TABS: 60.0000 mg | ORAL_TABLET | ORAL | 3 refills | Status: DC

## 2017-02-18 MED ORDER — EXENATIDE ER 2 MG ~~LOC~~ SRER: 2.0000 mg | SUBCUTANEOUS | 0 refills | Status: DC

## 2017-02-18 MED ORDER — LOSARTAN POTASSIUM-HCTZ 100-25 MG PO TABS: 1.0000 | ORAL_TABLET | Freq: Every day | ORAL | 1 refills | Status: DC

## 2017-02-18 MED ORDER — FLUCONAZOLE 150 MG PO TABS: 150.0000 mg | ORAL_TABLET | ORAL | 0 refills | Status: DC

## 2017-02-18 MED ORDER — ROSUVASTATIN CALCIUM 10 MG PO TABS: 10.0000 mg | ORAL_TABLET | Freq: Every day | ORAL | 1 refills | Status: DC

## 2017-02-18 MED ORDER — NYSTATIN 100000 UNIT/GM EX CREA: 1.0000 "application " | TOPICAL_CREAM | Freq: Two times a day (BID) | CUTANEOUS | 2 refills | Status: DC

## 2017-02-18 NOTE — Patient Instructions (Signed)
Tyler Aas in place of lantus Ozempic in place of bydureon

## 2017-02-18 NOTE — Progress Notes (Signed)
Name: Bonnie Rowland   MRN: [id number]    DOB: [birth year]   Date:02/18/2017       Progress Note  Subjective  Chief Complaint  Chief Complaint  Patient presents with  . Diabetes    followed by [place] appt with Dr. Raechel Chute in Sept  . Hypertension  . Hyperlipidemia    HPI  DMII: diabetes is now at goal, still seeing Endo,  and with diabetic retinopathy, eye exam is up to date. Her hgbA1C has gone up from 6.3% back in 05/2015 to over 9 In June 2017 ,9.1 % 04/2016 and today is up to 10.3%, hgbA1C went down 8.3%, down to 7.0% today and is doing well. She states she is compliant with her medication,  but not compliant with her diet, and also checking fsbs 4 times daily. FSBS 52-200, but average past week was 148. No polyphagia, polydipsia or polyuria.   HTN: bp is at goal now. No chest pain or palpitation. On ARB, no side effects of medication  Hyperlipidemia: taking Crestor as prescribed and not side effects, last labs done at Endo was May and is at goal. She denies muscle aches. No chest pain.   Right knee DDD: she has OA of right knee, but going to the gym and trying to walk more, pain is described as aching like, intermittent, triggered by activity, sometimes affects her walking.   History of malignant melanoma: up to date with Dermatologist - Dr Evorn Gong   Atherosclerosis of renal artery: bp is at goal, taking Crestor and aspirin as prescribed, no side effects  Tinea cruris: improved buct continues to have outbreaks and would like refill of medication   Patient Active Problem List   Diagnosis Date Noted  . Bilateral carotid bruits 08/24/2015  . 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 Atrial flutter ([place]) 02/22/2015  . Chronic pain 01/10/2015  . 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 Fatigue 01/10/2015  . History of melanoma excision 01/10/2015  . Adiposity  01/10/2015  . 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 Spinal stenosis, lumbar region, 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  . DDD (degenerative disc disease), lumbar 11/14/2014  . Greater trochanteric bursitis of both hips 11/14/2014  . Piriformis syndrome 11/14/2014  . 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 Hyperlipemia 11/07/2009  . Hypertension, benign 11/07/2009  . Atherosclerosis of renal artery ([place]) 11/07/2009  . Perennial allergic rhinitis 11/27/2007  . Lumbosacral neuritis 01/17/2007    Past Surgical History:  Procedure Laterality Date  . APPENDECTOMY    . CARDIAC CATHETERIZATION    . cataract surgery    . COLONOSCOPY    . COLONOSCOPY WITH PROPOFOL N/A 05/09/2016   Procedure: COLONOSCOPY WITH PROPOFOL;  Surgeon: Robert Bellow, MD;  Location: [facility] ENDOSCOPY;  Service: Endoscopy;  Laterality: N/A;  . DIAGNOSTIC MAMMOGRAM    . EYE SURGERY Bilateral    Cataract Extraction  . KNEE ARTHROPLASTY Right 03/30/2015   Procedure: COMPUTER ASSISTED TOTAL KNEE ARTHROPLASTY;  Surgeon: Dereck Leep, MD;  Location: [facility] ORS;  Service: Orthopedics;  Laterality: Right;  . KNEE ARTHROSCOPY Right   . KNEE CLOSED REDUCTION Right 05/23/2015   Procedure: CLOSED MANIPULATION KNEE;  Surgeon: Dereck Leep, MD;  Location: [facility] ORS;  Service: Orthopedics;  Laterality: Right;  . TONSILLECTOMY      Family History  Problem Relation Age of Onset  . Diabetes Mother   . Hypertension Mother   . Cancer  Mother   . Hypertension Father   . Cancer Father        Prostate  . Breast cancer Maternal Aunt 77  . Kidney cancer Neg Hx   . Bladder Cancer Neg Hx     Social History   Social History  . Marital status: Married    Spouse name: N/A  . Number of children: N/A  . Years of education: N/A   Occupational History  . Not on file.   Social History Main Topics  . Smoking status: Never Smoker  . Smokeless tobacco: Never Used  . Alcohol use 0.0 oz/week     Comment: occ  . Drug use: No  . Sexual  activity: Yes    Partners: Male   Other Topics Concern  . Not on file   Social History Narrative  . No narrative on file     Current Outpatient Prescriptions:  .  ALFALFA PO, Take 3 tablets by mouth every morning., Disp: , Rfl:  .  aspirin 81 MG EC tablet, Take 81 mg by mouth at bedtime. , Disp: , Rfl:  .  B Complex-C (B-COMPLEX WITH VITAMIN C) tablet, Take 2 tablets by mouth at bedtime. , Disp: , Rfl:  .  BLACK ELDERBERRY,BERRY-FLOWER, PO, Take by mouth., Disp: , Rfl:  .  calcium carbonate (OS-CAL) 600 MG TABS tablet, Take 600 mg by mouth daily., Disp: , Rfl:  .  cholecalciferol (VITAMIN D) 1000 UNITS tablet, Take 4,000 Units by mouth daily. , Disp: , Rfl:  .  clobetasol ointment (TEMOVATE) 3.71 %, Apply 1 application topically 2 (two) times daily as needed (bug bites). Reported on 06/30/2015, Disp: , Rfl:  .  Coenzyme Q10 (COQ10) 200 MG CAPS, Take 1 tablet by mouth daily. 400 mg every am., Disp: , Rfl:  .  Continuous Blood Gluc Sensor ([place]) MISC, by Does not apply route., Disp: , Rfl:  .  estradiol (ESTRACE VAGINAL) 0.1 MG/GM vaginal cream, 1/2 gm once weekly using applicator, apply blueberry sized amount of cream using tip of finger to urethra twice weekly, Disp: 50 g, Rfl: 4 .  Exenatide ER 2 MG SRER, Inject 2 mg into the skin once a week., Disp: 12 each, Rfl: 0 .  Eyelid Cleansers (AVENOVA) 0.01 % SOLN, See admin instructions., Disp: , Rfl: 3 .  fish oil-omega-3 fatty acids 1000 MG capsule, Take 2 g by mouth daily.  , Disp: , Rfl:  .  fluticasone (FLONASE) 50 MCG/ACT nasal spray, PLACE 2 SPRAYS INTO BOTH NOSTRILS DAILY., Disp: 8.5 g, Rfl: 2 .  GARLIC 6967 PO, Take 1 tablet by mouth daily. 1 gram, Disp: , Rfl:  .  Glucosamine 500 MG TABS, Take 1 tablet by mouth daily. , Disp: , Rfl:  .  glucose blood (ONE TOUCH ULTRA TEST) test strip, 1 each by Other route 3 (three) times daily. Use as instructed, Disp: 300 each, Rfl: 1 .  Insulin Glargine (LANTUS SOLOSTAR)  100 UNIT/ML Solostar Pen, Inject 24 Units into the skin every morning., Disp: 15 pen, Rfl: 2 .  insulin lispro (HUMALOG) 100 UNIT/ML KiwkPen, Inject into the skin., Disp: , Rfl:  .  Insulin Pen Needle 32G X 4 MM MISC, 1 pen by Does not apply route 2 (two) times daily. Use as directed 2 times daily, Disp: 200 each, Rfl: 2 .  Lancets (ONETOUCH ULTRASOFT) lancets, Use as instructed, Disp: 100 each, Rfl: 12 .  LECITHIN PO, Take 1 capsule by mouth every other day., Disp: ,  Rfl:  .  loratadine (CLARITIN) 10 MG tablet, Take 1 tablet (10 mg total) by mouth daily., Disp: 30 tablet, Rfl: 11 .  losartan-hydrochlorothiazide (HYZAAR) 100-25 MG tablet, Take 1 tablet by mouth daily., Disp: 90 tablet, Rfl: 1 .  MAGNESIUM-POTASSIUM PO, Take 1 tablet by mouth 2 (two) times daily. Reported on 06/30/2015, Disp: , Rfl:  .  metoprolol succinate (TOPROL-XL) 25 MG 24 hr tablet, Take 1 tablet (25 mg total) by mouth daily., Disp: 90 tablet, Rfl: 1 .  Multiple Vitamins-Minerals (PRESERVISION AREDS 2) CAPS, Take 1 capsule by mouth 2 (two) times daily., Disp: , Rfl:  .  nystatin cream (MYCOSTATIN), Apply 1 application topically 2 (two) times daily., Disp: 30 g, Rfl: 2 .  Probiotic Product (PROBIOTIC PO), Take 1 Dose by mouth at bedtime., Disp: , Rfl:  .  Propylene Glycol (SYSTANE BALANCE OP), Apply 1 drop to eye as needed., Disp: , Rfl:  .  raloxifene (EVISTA) 60 MG tablet, Take 1 tablet (60 mg total) by mouth every morning., Disp: 90 tablet, Rfl: 3 .  rosuvastatin (CRESTOR) 10 MG tablet, Take 1 tablet (10 mg total) by mouth daily., Disp: 90 tablet, Rfl: 1 .  sodium fluoride (DENTAGEL) 1.1 % GEL dental gel, , Disp: , Rfl:  .  triamcinolone cream (KENALOG) 0.1 %, Apply 1 application topically 2 (two) times daily., Disp: 45 g, Rfl: 0 .  vitamin E (VITAMIN E) 400 UNIT capsule, Take 400 Units by mouth daily. Every am, Disp: , Rfl:  .  fluconazole (DIFLUCAN) 150 MG tablet, Take 1 tablet (150 mg total) by mouth every other day.,  Disp: 3 tablet, Rfl: 0 .  sulfacetamide (BLEPH-10) 10 % ophthalmic solution, Place 1 drop into both eyes 3 (three) times daily as needed., Disp: , Rfl:   Allergies  Allergen Reactions  . Cyclobenzaprine Hypertension  . Cheese Other (See Comments)    bloating  . Ciprofloxacin Hcl Other (See Comments)    Muscle pain  . Coconut Oil      ROS  Constitutional: Negative for fever, positive for  weight change.  Respiratory: Negative for cough and shortness of breath.   Cardiovascular: Negative for chest pain or palpitations.  Gastrointestinal: Negative for abdominal pain, no bowel changes.  Musculoskeletal: Positive  for gait problem and right knee intermittent  joint swelling.  Skin: Positive  for rash.  Neurological: Negative for dizziness or headache.  No other specific complaints in a complete review of systems (except as listed in HPI above).   Objective  Vitals:   02/18/17 0839  BP: 128/64  Pulse: 74  Resp: 16  Temp: 97.7 F (36.5 C)  SpO2: 95%  Weight: 200 lb 3 oz (90.8 kg)  Height: 5\' 4"  (1.626 m)    Body mass index is 34.36 kg/m.  Physical Exam  Constitutional: Patient appears well-developed and well-nourished. Obese  No distress.  HEENT: head atraumatic, normocephalic, pupils equal and reactive to light,  neck supple, throat within normal limits Cardiovascular: Normal rate, regular rhythm and normal heart sounds.  No murmur heard. No BLE edema. Pulmonary/Chest: Effort normal and breath sounds normal. No respiratory distress. Abdominal: Soft.  There is no tenderness. Psychiatric: Patient has a normal mood and affect. behavior is normal. Judgment and thought content normal.  Recent Results (from the past 2160 hour(s))  POCT UA - Microalbumin     Status: Normal   Collection Time: 02/18/17  9:01 AM  Result Value Ref Range   Microalbumin Ur, POC 0 mg/L  Creatinine, POC  mg/dL   Albumin/Creatinine Ratio, Urine, POC    POCT HgB A1C     Status: Abnormal    Collection Time: 02/18/17  9:05 AM  Result Value Ref Range   Hemoglobin A1C 7.0     Diabetic Foot Exam: Diabetic Foot Exam - Simple   Simple Foot Form Diabetic Foot exam was performed with the following findings:  Yes 02/18/2017  9:44 AM  Visual Inspection No deformities, no ulcerations, no other skin breakdown bilaterally:  Yes Sensation Testing Intact to touch and monofilament testing bilaterally:  Yes Pulse Check Posterior Tibialis and Dorsalis pulse intact bilaterally:  Yes Comments Spider veins both feet and ankles      PHQ2/9: Depression screen [facility] 2/9 09/24/2016 08/21/2016 06/18/2016 04/23/2016 03/02/2016  Decreased Interest 0 0 0 0 0  Down, Depressed, Hopeless 0 0 0 0 0  PHQ - 2 Score 0 0 0 0 0    Fall Risk: Fall Risk  12/17/2016 10/22/2016 09/24/2016 08/21/2016 06/18/2016  Falls in the past year? No No No No No  Comment - - - - -     Assessment & Plan  1. Controlled type 2 diabetes mellitus with both eyes affected by mild nonproliferative retinopathy without macular edema, with long-term current use of insulin (HCC)  - POCT HgB A1C - POCT UA - Microalbumin - Exenatide ER 2 MG SRER; Inject 2 mg into the skin once a week.  Dispense: 12 each; Refill: 0  2. 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(HCC)  stable  3. Hypertension, benign  - metoprolol succinate (TOPROL-XL) 25 MG 24 hr tablet; Take 1 tablet (25 mg total) by mouth daily.  Dispense: 90 tablet; Refill: 1 - losartan-hydrochlorothiazide (HYZAAR) 100-25 MG tablet; Take 1 tablet by mouth daily.  Dispense: 90 tablet; Refill: 1  4. Dyslipidemia associated with type 2 diabetes mellitus (HCC)  - rosuvastatin (CRESTOR) 10 MG tablet; Take 1 tablet (10 mg total) by mouth daily.  Dispense: 90 tablet; Refill: 1  5. Atherosclerosis of renal artery ([place])  Had labs done recently by endocrinologist  - rosuvastatin (CRESTOR) 10 MG tablet; Take 1 tablet (10 mg total) by mouth daily.  Dispense: 90 tablet; Refill: 1  6. Gastroesophageal reflux  disease without esophagitis  Controlled   7. Candida infection of flexural skin  - nystatin cream (MYCOSTATIN); Apply 1 application topically 2 (two) times daily.  Dispense: 30 g; Refill: 2 - fluconazole (DIFLUCAN) 150 MG tablet; Take 1 tablet (150 mg total) by mouth every other day.  Dispense: 3 tablet; Refill: 0

## 2017-02-21 DIAGNOSIS — M5033 Other cervical disc degeneration, cervicothoracic region: Secondary | ICD-10-CM | POA: Diagnosis not present

## 2017-02-21 DIAGNOSIS — M5416 Radiculopathy, lumbar region: Secondary | ICD-10-CM | POA: Diagnosis not present

## 2017-02-21 DIAGNOSIS — M9903 Segmental and somatic dysfunction of lumbar region: Secondary | ICD-10-CM | POA: Diagnosis not present

## 2017-02-21 DIAGNOSIS — M9901 Segmental and somatic dysfunction of cervical region: Secondary | ICD-10-CM | POA: Diagnosis not present

## 2017-02-28 DIAGNOSIS — M5033 Other cervical disc degeneration, cervicothoracic region: Secondary | ICD-10-CM | POA: Diagnosis not present

## 2017-02-28 DIAGNOSIS — M9903 Segmental and somatic dysfunction of lumbar region: Secondary | ICD-10-CM | POA: Diagnosis not present

## 2017-02-28 DIAGNOSIS — M5416 Radiculopathy, lumbar region: Secondary | ICD-10-CM | POA: Diagnosis not present

## 2017-02-28 DIAGNOSIS — M9901 Segmental and somatic dysfunction of cervical region: Secondary | ICD-10-CM | POA: Diagnosis not present

## 2017-03-07 DIAGNOSIS — M9903 Segmental and somatic dysfunction of lumbar region: Secondary | ICD-10-CM | POA: Diagnosis not present

## 2017-03-07 DIAGNOSIS — M5416 Radiculopathy, lumbar region: Secondary | ICD-10-CM | POA: Diagnosis not present

## 2017-03-07 DIAGNOSIS — M5033 Other cervical disc degeneration, cervicothoracic region: Secondary | ICD-10-CM | POA: Diagnosis not present

## 2017-03-07 DIAGNOSIS — M9901 Segmental and somatic dysfunction of cervical region: Secondary | ICD-10-CM | POA: Diagnosis not present

## 2017-03-12 ENCOUNTER — Ambulatory Visit (INDEPENDENT_AMBULATORY_CARE_PROVIDER_SITE_OTHER): Payer: Medicare Other

## 2017-03-12 DIAGNOSIS — Z23 Encounter for immunization: Secondary | ICD-10-CM | POA: Diagnosis not present

## 2017-03-14 DIAGNOSIS — M5033 Other cervical disc degeneration, cervicothoracic region: Secondary | ICD-10-CM | POA: Diagnosis not present

## 2017-03-14 DIAGNOSIS — M9903 Segmental and somatic dysfunction of lumbar region: Secondary | ICD-10-CM | POA: Diagnosis not present

## 2017-03-14 DIAGNOSIS — M5416 Radiculopathy, lumbar region: Secondary | ICD-10-CM | POA: Diagnosis not present

## 2017-03-14 DIAGNOSIS — M9901 Segmental and somatic dysfunction of cervical region: Secondary | ICD-10-CM | POA: Diagnosis not present

## 2017-03-18 DIAGNOSIS — M85859 Other specified disorders of bone density and structure, unspecified thigh: Secondary | ICD-10-CM | POA: Diagnosis not present

## 2017-03-18 DIAGNOSIS — I10 Essential (primary) hypertension: Secondary | ICD-10-CM | POA: Diagnosis not present

## 2017-03-18 DIAGNOSIS — E1159 Type 2 diabetes mellitus with other circulatory complications: Secondary | ICD-10-CM | POA: Diagnosis not present

## 2017-03-18 DIAGNOSIS — E785 Hyperlipidemia, unspecified: Secondary | ICD-10-CM | POA: Diagnosis not present

## 2017-03-18 DIAGNOSIS — Z794 Long term (current) use of insulin: Secondary | ICD-10-CM | POA: Diagnosis not present

## 2017-03-18 DIAGNOSIS — E1165 Type 2 diabetes mellitus with hyperglycemia: Secondary | ICD-10-CM | POA: Diagnosis not present

## 2017-03-18 DIAGNOSIS — E1169 Type 2 diabetes mellitus with other specified complication: Secondary | ICD-10-CM | POA: Diagnosis not present

## 2017-03-21 DIAGNOSIS — M5033 Other cervical disc degeneration, cervicothoracic region: Secondary | ICD-10-CM | POA: Diagnosis not present

## 2017-03-21 DIAGNOSIS — M5416 Radiculopathy, lumbar region: Secondary | ICD-10-CM | POA: Diagnosis not present

## 2017-03-21 DIAGNOSIS — M9901 Segmental and somatic dysfunction of cervical region: Secondary | ICD-10-CM | POA: Diagnosis not present

## 2017-03-21 DIAGNOSIS — M9903 Segmental and somatic dysfunction of lumbar region: Secondary | ICD-10-CM | POA: Diagnosis not present

## 2017-03-28 DIAGNOSIS — M9903 Segmental and somatic dysfunction of lumbar region: Secondary | ICD-10-CM | POA: Diagnosis not present

## 2017-03-28 DIAGNOSIS — M5033 Other cervical disc degeneration, cervicothoracic region: Secondary | ICD-10-CM | POA: Diagnosis not present

## 2017-03-28 DIAGNOSIS — M9901 Segmental and somatic dysfunction of cervical region: Secondary | ICD-10-CM | POA: Diagnosis not present

## 2017-03-28 DIAGNOSIS — M5416 Radiculopathy, lumbar region: Secondary | ICD-10-CM | POA: Diagnosis not present

## 2017-04-04 DIAGNOSIS — M5416 Radiculopathy, lumbar region: Secondary | ICD-10-CM | POA: Diagnosis not present

## 2017-04-04 DIAGNOSIS — M9901 Segmental and somatic dysfunction of cervical region: Secondary | ICD-10-CM | POA: Diagnosis not present

## 2017-04-04 DIAGNOSIS — M5033 Other cervical disc degeneration, cervicothoracic region: Secondary | ICD-10-CM | POA: Diagnosis not present

## 2017-04-04 DIAGNOSIS — M9903 Segmental and somatic dysfunction of lumbar region: Secondary | ICD-10-CM | POA: Diagnosis not present

## 2017-04-08 ENCOUNTER — Ambulatory Visit (INDEPENDENT_AMBULATORY_CARE_PROVIDER_SITE_OTHER): Payer: Medicare Other

## 2017-04-08 VITALS — BP 140/70 | HR 76 | Resp 16 | Ht 64.0 in | Wt 201.0 lb

## 2017-04-08 DIAGNOSIS — Z13820 Encounter for screening for osteoporosis: Secondary | ICD-10-CM | POA: Diagnosis not present

## 2017-04-08 DIAGNOSIS — E2839 Other primary ovarian failure: Secondary | ICD-10-CM

## 2017-04-08 DIAGNOSIS — Z0000 Encounter for general adult medical examination without abnormal findings: Secondary | ICD-10-CM

## 2017-04-08 NOTE — Addendum Note (Signed)
Addended by: Steele Sizer F on: 04/08/2017 07:47 PM   Modules accepted: Orders

## 2017-04-08 NOTE — Patient Instructions (Addendum)
Bonnie Rowland , Thank you for taking time to come for your Medicare Wellness Visit. I appreciate your ongoing commitment to your health goals. Please review the following plan we discussed and let me know if I can assist you in the future.   Screening recommendations/referrals: Colonoscopy: complete Mammogram: complete Bone Density: Currently due. Unable to order today. Recommended yearly ophthalmology/optometry visit for glaucoma screening and checkup Recommended yearly dental visit for hygiene and checkup  Vaccinations: Influenza vaccine: up to date Pneumococcal vaccine: completed series Tdap vaccine: up to date Shingles vaccine: up to date    Advanced directives: Copy of your POA (Power of Attorney) and/or Living Will can be located in your chart.   Conditions/risks identified: Recommend to eliminate carbs and sweets from diet; Fall risk prevention discussed  Next appointment: Scheduled to see Dr. Ancil Boozer on 08/23/17 @ 8:40am. Follow up in one year for annual wellness exam.   Preventive Care 65 Years and Older, Female Preventive care refers to lifestyle choices and visits with your health care provider that can promote health and wellness. What does preventive care include?  A yearly physical exam. This is also called an annual well check.  Dental exams once or twice a year.  Routine eye exams. Ask your health care provider how often you should have your eyes checked.  Personal lifestyle choices, including:  Daily care of your teeth and gums.  Regular physical activity.  Eating a healthy diet.  Avoiding tobacco and drug use.  Limiting alcohol use.  Practicing safe sex.  Taking low-dose aspirin every day.  Taking vitamin and mineral supplements as recommended by your health care provider. What happens during an annual well check? The services and screenings done by your health care provider during your annual well check will depend on your age, overall health,  lifestyle risk factors, and family history of disease. Counseling  Your health care provider may ask you questions about your:  Alcohol use.  Tobacco use.  Drug use.  Emotional well-being.  Home and relationship well-being.  Sexual activity.  Eating habits.  History of falls.  Memory and ability to understand (cognition).  Work and work Statistician.  Reproductive health. Screening  You may have the following tests or measurements:  Height, weight, and BMI.  Blood pressure.  Lipid and cholesterol levels. These may be checked every 5 years, or more frequently if you are over 46 years old.  Skin check.  Lung cancer screening. You may have this screening every year starting at age 89 if you have a 30-pack-year history of smoking and currently smoke or have quit within the past 15 years.  Fecal occult blood test (FOBT) of the stool. You may have this test every year starting at age 21.  Flexible sigmoidoscopy or colonoscopy. You may have a sigmoidoscopy every 5 years or a colonoscopy every 10 years starting at age 68.  Hepatitis C blood test.  Hepatitis B blood test.  Sexually transmitted disease (STD) testing.  Diabetes screening. This is done by checking your blood sugar (glucose) after you have not eaten for a while (fasting). You may have this done every 1-3 years.  Bone density scan. This is done to screen for osteoporosis. You may have this done starting at age 63.  Mammogram. This may be done every 1-2 years. Talk to your health care provider about how often you should have regular mammograms. Talk with your health care provider about your test results, treatment options, and if necessary, the need for more  tests. Vaccines  Your health care provider may recommend certain vaccines, such as:  Influenza vaccine. This is recommended every year.  Tetanus, diphtheria, and acellular pertussis (Tdap, Td) vaccine. You may need a Td booster every 10 years.  Zoster  vaccine. You may need this after age 82.  Pneumococcal 13-valent conjugate (PCV13) vaccine. One dose is recommended after age 26.  Pneumococcal polysaccharide (PPSV23) vaccine. One dose is recommended after age 35. Talk to your health care provider about which screenings and vaccines you need and how often you need them. This information is not intended to replace advice given to you by your health care provider. Make sure you discuss any questions you have with your health care provider. Document Released: 07/15/2015 Document Revised: 03/07/2016 Document Reviewed: 04/19/2015 Elsevier Interactive Patient Education  2017 [place] Prevention in the Home Falls can cause injuries. They can happen to people of all ages. There are many things you can do to make your home safe and to help prevent falls. What can I do on the outside of my home?  Regularly fix the edges of walkways and driveways and fix any cracks.  Remove anything that might make you trip as you walk through a door, such as a raised step or threshold.  Trim any bushes or trees on the path to your home.  Use bright outdoor lighting.  Clear any walking paths of anything that might make someone trip, such as rocks or tools.  Regularly check to see if handrails are loose or broken. Make sure that both sides of any steps have handrails.  Any raised decks and porches should have guardrails on the edges.  Have any leaves, snow, or ice cleared regularly.  Use sand or salt on walking paths during winter.  Clean up any spills in your garage right away. This includes oil or grease spills. What can I do in the bathroom?  Use night lights.  Install grab bars by the toilet and in the tub and shower. Do not use towel bars as grab bars.  Use non-skid mats or decals in the tub or shower.  If you need to sit down in the shower, use a plastic, non-slip stool.  Keep the floor dry. Clean up any water that spills on the  floor as soon as it happens.  Remove soap buildup in the tub or shower regularly.  Attach bath mats securely with double-sided non-slip rug tape.  Do not have throw rugs and other things on the floor that can make you trip. What can I do in the bedroom?  Use night lights.  Make sure that you have a light by your bed that is easy to reach.  Do not use any sheets or blankets that are too big for your bed. They should not hang down onto the floor.  Have a firm chair that has side arms. You can use this for support while you get dressed.  Do not have throw rugs and other things on the floor that can make you trip. What can I do in the kitchen?  Clean up any spills right away.  Avoid walking on wet floors.  Keep items that you use a lot in easy-to-reach places.  If you need to reach something above you, use a strong step stool that has a grab bar.  Keep electrical cords out of the way.  Do not use floor polish or wax that makes floors slippery. If you must use wax, use non-skid floor  wax.  Do not have throw rugs and other things on the floor that can make you trip. What can I do with my stairs?  Do not leave any items on the stairs.  Make sure that there are handrails on both sides of the stairs and use them. Fix handrails that are broken or loose. Make sure that handrails are as long as the stairways.  Check any carpeting to make sure that it is firmly attached to the stairs. Fix any carpet that is loose or worn.  Avoid having throw rugs at the top or bottom of the stairs. If you do have throw rugs, attach them to the floor with carpet tape.  Make sure that you have a light switch at the top of the stairs and the bottom of the stairs. If you do not have them, ask someone to add them for you. What else can I do to help prevent falls?  Wear shoes that:  Do not have high heels.  Have rubber bottoms.  Are comfortable and fit you well.  Are closed at the toe. Do not wear  sandals.  If you use a stepladder:  Make sure that it is fully opened. Do not climb a closed stepladder.  Make sure that both sides of the stepladder are locked into place.  Ask someone to hold it for you, if possible.  Clearly mark and make sure that you can see:  Any grab bars or handrails.  First and last steps.  Where the edge of each step is.  Use tools that help you move around (mobility aids) if they are needed. These include:  Canes.  Walkers.  Scooters.  Crutches.  Turn on the lights when you go into a dark area. Replace any light bulbs as soon as they burn out.  Set up your furniture so you have a clear path. Avoid moving your furniture around.  If any of your floors are uneven, fix them.  If there are any pets around you, be aware of where they are.  Review your medicines with your doctor. Some medicines can make you feel dizzy. This can increase your chance of falling. Ask your doctor what other things that you can do to help prevent falls. This information is not intended to replace advice given to you by your health care provider. Make sure you discuss any questions you have with your health care provider. Document Released: 04/14/2009 Document Revised: 11/24/2015 Document Reviewed: 07/23/2014 Elsevier Interactive Patient Education  2017 [employer].

## 2017-04-08 NOTE — Progress Notes (Signed)
Subjective:   Bonnie Rowland is a 76 y.o. female who presents for Medicare Annual (Subsequent) preventive examination.  Review of Systems:  N/A Cardiac Risk Factors include: dyslipidemia;advanced age (>26men, >3 women);diabetes mellitus;obesity (BMI >30kg/m2);hypertension     Objective:     Vitals: BP 140/70 (BP Location: Right Arm, Patient Position: Sitting, Cuff Size: Normal)   Pulse 76   Resp 16   Ht 5\' 4"  (1.626 m)   Wt 201 lb (91.2 kg)   BMI 34.50 kg/m   Body mass index is 34.5 kg/m.   Tobacco History  Smoking Status  . Never Smoker  Smokeless Tobacco  . Never Used     Counseling given: Not Answered   Past Medical History:  Diagnosis Date  . Acute cystitis   . Allergic rhinitis, cause unspecified   . 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 Bronchitis, not specified as acute or chronic   . 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 Cellulitis and abscess of leg, except foot   . Conjunctivitis unspecified   . Dermatophytosis of nail   . Diabetes mellitus    type II  . Esophageal reflux   . Hyperlipidemia   . Hypertension   . 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 Renal artery stenosis ([place])   . Sprain of lumbar region   . Thoracic or lumbosacral neuritis or radiculitis, unspecified   . Urinary tract infection, site not specified    Past Surgical History:  Procedure Laterality Date  . APPENDECTOMY    . CARDIAC CATHETERIZATION    . cataract surgery    . COLONOSCOPY    . COLONOSCOPY WITH PROPOFOL N/A 05/09/2016   Procedure: COLONOSCOPY WITH PROPOFOL;  Surgeon: Robert Bellow, MD;  Location: [facility] ENDOSCOPY;  Service: Endoscopy;  Laterality: N/A;  . DIAGNOSTIC MAMMOGRAM    . EYE SURGERY Bilateral    Cataract Extraction  . KNEE ARTHROPLASTY Right 03/30/2015   Procedure: COMPUTER ASSISTED TOTAL KNEE ARTHROPLASTY;  Surgeon: Dereck Leep, MD;  Location: [facility] ORS;   Service: Orthopedics;  Laterality: Right;  . KNEE ARTHROSCOPY Right   . KNEE CLOSED REDUCTION Right 05/23/2015   Procedure: CLOSED MANIPULATION KNEE;  Surgeon: Dereck Leep, MD;  Location: [facility] ORS;  Service: Orthopedics;  Laterality: Right;  . TONSILLECTOMY     Family History  Problem Relation Age of Onset  . Diabetes Mother   . Hypertension Mother   . Cancer Mother   . Hypertension Father   . Cancer Father        Prostate  . Breast cancer Maternal Aunt 3  . Colon cancer Son   . Kidney cancer Neg Hx   . Bladder Cancer Neg Hx    History  Sexual Activity  . Sexual activity: Yes  . Partners: Male    Outpatient Encounter Prescriptions as of 04/08/2017  Medication Sig  . ALFALFA PO Take 3 tablets by mouth every morning.  Marland Kitchen aspirin 81 MG EC tablet Take 81 mg by mouth at bedtime.   . B Complex-C (B-COMPLEX WITH VITAMIN C) tablet Take 2 tablets by mouth at bedtime.   Marland Kitchen BLACK ELDERBERRY,BERRY-FLOWER, PO Take by mouth.  . calcium carbonate (OS-CAL) 600 MG TABS tablet Take 600 mg by mouth daily.  . cholecalciferol (VITAMIN D) 1000 UNITS tablet Take 4,000 Units by mouth daily.   . clobetasol ointment (TEMOVATE) 0.05 %  Apply 1 application topically 2 (two) times daily as needed (bug bites). Reported on 06/30/2015  . Coenzyme Q10 (COQ10) 200 MG CAPS Take 1 tablet by mouth daily. 400 mg every am.  . Continuous Blood Gluc Sensor ([place]) MISC by Does not apply route.  Marland Kitchen estradiol (ESTRACE VAGINAL) 0.1 MG/GM vaginal cream 1/2 gm once weekly using applicator, apply blueberry sized amount of cream using tip of finger to urethra twice weekly  . Exenatide ER 2 MG SRER Inject 2 mg into the skin once a week.  . Eyelid Cleansers (AVENOVA) 0.01 % SOLN See admin instructions.  . fish oil-omega-3 fatty acids 1000 MG capsule Take 2 g by mouth daily.    . fluticasone (FLONASE) 50 MCG/ACT nasal spray PLACE 2 SPRAYS INTO BOTH NOSTRILS DAILY.  Marland Kitchen GARLIC 0086 PO Take 1 tablet by  mouth daily. 1 gram  . Glucosamine 500 MG TABS Take 1 tablet by mouth daily.   Marland Kitchen glucose blood (ONE TOUCH ULTRA TEST) test strip 1 each by Other route 3 (three) times daily. Use as instructed  . Insulin Glargine (LANTUS SOLOSTAR) 100 UNIT/ML Solostar Pen Inject 24 Units into the skin every morning.  . insulin lispro (HUMALOG) 100 UNIT/ML KiwkPen Inject into the skin.  . Insulin Pen Needle 32G X 4 MM MISC 1 pen by Does not apply route 2 (two) times daily. Use as directed 2 times daily  . Lancets (ONETOUCH ULTRASOFT) lancets Use as instructed  . losartan-hydrochlorothiazide (HYZAAR) 100-25 MG tablet Take 1 tablet by mouth daily.  Marland Kitchen MAGNESIUM-POTASSIUM PO Take 1 tablet by mouth 2 (two) times daily. Reported on 06/30/2015  . metoprolol succinate (TOPROL-XL) 25 MG 24 hr tablet Take 1 tablet (25 mg total) by mouth daily.  . Multiple Vitamins-Minerals (PRESERVISION AREDS 2) CAPS Take 1 capsule by mouth 2 (two) times daily.  Marland Kitchen nystatin cream (MYCOSTATIN) Apply 1 application topically 2 (two) times daily.  . Probiotic Product (PROBIOTIC PO) Take 1 Dose by mouth at bedtime.  Marland Kitchen Propylene Glycol (SYSTANE BALANCE OP) Apply 1 drop to eye as needed.  . raloxifene (EVISTA) 60 MG tablet Take 1 tablet (60 mg total) by mouth every morning.  . rosuvastatin (CRESTOR) 10 MG tablet Take 1 tablet (10 mg total) by mouth daily.  . sodium fluoride (DENTAGEL) 1.1 % GEL dental gel   . sulfacetamide (BLEPH-10) 10 % ophthalmic solution Place 1 drop into both eyes 3 (three) times daily as needed.  . vitamin E (VITAMIN E) 400 UNIT capsule Take 400 Units by mouth daily. Every am  . fluconazole (DIFLUCAN) 150 MG tablet Take 1 tablet (150 mg total) by mouth every other day. (Patient not taking: Reported on 04/08/2017)  . LECITHIN PO Take 1 capsule by mouth every other day.  . loratadine (CLARITIN) 10 MG tablet Take 1 tablet (10 mg total) by mouth daily. (Patient not taking: Reported on 04/08/2017)  . triamcinolone cream (KENALOG)  0.1 % Apply 1 application topically 2 (two) times daily. (Patient not taking: Reported on 04/08/2017)   No facility-administered encounter medications on file as of 04/08/2017.     Activities of Daily Living In your present state of health, do you have any difficulty performing the following activities: 04/08/2017 08/21/2016  Hearing? N N  Vision? N N  Difficulty concentrating or making decisions? N N  Walking or climbing stairs? Y Y  Comment pain in R knee -  Dressing or bathing? N N  Doing errands, shopping? N N  Conservation officer, nature and  eating ? N -  Using the Toilet? N -  In the past six months, have you accidently leaked urine? N -  Do you have problems with loss of bowel control? N -  Managing your Medications? N -  Managing your Finances? N -  Housekeeping or managing your Housekeeping? N -  Some recent data might be hidden    Patient Care Team: Steele Sizer, MD as PCP - General (Family Medicine) Minna Merritts, MD as Consulting Physician (Cardiology) Bary Castilla, Forest Gleason, MD (General Surgery) Dasher, Rayvon Char, MD as Consulting Physician (Dermatology) Estill Cotta, MD as Consulting Physician (Ophthalmology) Starling Manns Teresa Pelton, MD as Referring Physician (Ophthalmology)    Assessment:     Exercise Activities and Dietary recommendations Current Exercise Habits: Structured exercise class, Type of exercise: strength training/weights;walking, Time (Minutes): 45, Frequency (Times/Week): 3, Weekly Exercise (Minutes/Week): 135, Intensity: Mild, Exercise limited by: None identified  Goals    . Reduce sugar intake to X grams per day          Recommend to eliminate carbs and sweets from diet      Fall Risk Fall Risk  04/08/2017 12/17/2016 10/22/2016 09/24/2016 08/21/2016  Falls in the past year? No No No No No  Comment - - - - -  Risk for fall due to : Impaired mobility - - - -  Risk for fall due to: Comment pain in R knee - - - -   Depression Screen PHQ 2/9 Scores  04/08/2017 09/24/2016 08/21/2016 06/18/2016  PHQ - 2 Score 0 0 0 0  Exception Documentation - - - -     Cognitive Function     6CIT Screen 04/08/2017  What Year? 0 points  What month? 0 points  What time? 0 points  Count back from 20 0 points  Months in reverse 0 points  Repeat phrase 0 points  Total Score 0    Immunization History  Administered Date(s) Administered  . Influenza, High Dose Seasonal PF 03/10/2015, 03/02/2016, 03/12/2017  . Pneumococcal Conjugate-13 07/13/2013  . Pneumococcal Polysaccharide-23 03/31/2010  . Tdap 03/07/2012  . Zoster 06/01/2010   Screening Tests Health Maintenance  Topic Date Due  . DEXA SCAN  07/16/2015  . HEMOGLOBIN A1C  08/21/2017  . OPHTHALMOLOGY EXAM  11/21/2017  . FOOT EXAM  02/18/2018  . MAMMOGRAM  05/08/2018  . TETANUS/TDAP  03/07/2022  . COLONOSCOPY  05/09/2026  . INFLUENZA VACCINE  Completed  . PNA vac Low Risk Adult  Completed      Plan:    I have personally reviewed and addressed the Medicare Annual Wellness questionnaire and have noted the following in the patient's chart:  A. Medical and social history B. Use of alcohol, tobacco or illicit drugs  C. Current medications and supplements D. Functional ability and status E.  Nutritional status F.  Physical activity G. Advance directives H. List of other physicians I.  Hospitalizations, surgeries, and ER visits in previous 12 months J.  [place] such as hearing and vision if needed, cognitive and depression L. Referrals and appointments - none  In addition, I have reviewed and discussed with patient certain preventive protocols, quality metrics, and best practice recommendations. A written personalized care plan for preventive services as well as general preventive health recommendations were provided to patient.  See attached scanned questionnaire for additional information.   Signed,  Aleatha Borer, LPN Nurse Health Advisor  1. Encounter for Medicare  annual wellness exam   2. Screening for osteoporosis  -  DG Bone Density; Future  3. Ovarian failure  - DG Bone Density; Future

## 2017-04-11 DIAGNOSIS — M9903 Segmental and somatic dysfunction of lumbar region: Secondary | ICD-10-CM | POA: Diagnosis not present

## 2017-04-11 DIAGNOSIS — M5033 Other cervical disc degeneration, cervicothoracic region: Secondary | ICD-10-CM | POA: Diagnosis not present

## 2017-04-11 DIAGNOSIS — M9901 Segmental and somatic dysfunction of cervical region: Secondary | ICD-10-CM | POA: Diagnosis not present

## 2017-04-11 DIAGNOSIS — M5416 Radiculopathy, lumbar region: Secondary | ICD-10-CM | POA: Diagnosis not present

## 2017-04-18 DIAGNOSIS — M5416 Radiculopathy, lumbar region: Secondary | ICD-10-CM | POA: Diagnosis not present

## 2017-04-18 DIAGNOSIS — M5033 Other cervical disc degeneration, cervicothoracic region: Secondary | ICD-10-CM | POA: Diagnosis not present

## 2017-04-18 DIAGNOSIS — M9901 Segmental and somatic dysfunction of cervical region: Secondary | ICD-10-CM | POA: Diagnosis not present

## 2017-04-18 DIAGNOSIS — M9903 Segmental and somatic dysfunction of lumbar region: Secondary | ICD-10-CM | POA: Diagnosis not present

## 2017-04-25 DIAGNOSIS — M5033 Other cervical disc degeneration, cervicothoracic region: Secondary | ICD-10-CM | POA: Diagnosis not present

## 2017-04-25 DIAGNOSIS — M5416 Radiculopathy, lumbar region: Secondary | ICD-10-CM | POA: Diagnosis not present

## 2017-04-25 DIAGNOSIS — M9903 Segmental and somatic dysfunction of lumbar region: Secondary | ICD-10-CM | POA: Diagnosis not present

## 2017-04-25 DIAGNOSIS — M9901 Segmental and somatic dysfunction of cervical region: Secondary | ICD-10-CM | POA: Diagnosis not present

## 2017-04-26 DIAGNOSIS — I788 Other diseases of capillaries: Secondary | ICD-10-CM | POA: Diagnosis not present

## 2017-04-26 DIAGNOSIS — Z872 Personal history of diseases of the skin and subcutaneous tissue: Secondary | ICD-10-CM | POA: Diagnosis not present

## 2017-04-26 DIAGNOSIS — D485 Neoplasm of uncertain behavior of skin: Secondary | ICD-10-CM | POA: Diagnosis not present

## 2017-04-26 DIAGNOSIS — Z85828 Personal history of other malignant neoplasm of skin: Secondary | ICD-10-CM | POA: Diagnosis not present

## 2017-04-26 DIAGNOSIS — Z85820 Personal history of malignant melanoma of skin: Secondary | ICD-10-CM | POA: Diagnosis not present

## 2017-04-26 DIAGNOSIS — D0462 Carcinoma in situ of skin of left upper limb, including shoulder: Secondary | ICD-10-CM | POA: Diagnosis not present

## 2017-05-02 DIAGNOSIS — M9903 Segmental and somatic dysfunction of lumbar region: Secondary | ICD-10-CM | POA: Diagnosis not present

## 2017-05-02 DIAGNOSIS — M5033 Other cervical disc degeneration, cervicothoracic region: Secondary | ICD-10-CM | POA: Diagnosis not present

## 2017-05-02 DIAGNOSIS — M5416 Radiculopathy, lumbar region: Secondary | ICD-10-CM | POA: Diagnosis not present

## 2017-05-02 DIAGNOSIS — M9901 Segmental and somatic dysfunction of cervical region: Secondary | ICD-10-CM | POA: Diagnosis not present

## 2017-05-08 DIAGNOSIS — M5033 Other cervical disc degeneration, cervicothoracic region: Secondary | ICD-10-CM | POA: Diagnosis not present

## 2017-05-08 DIAGNOSIS — M9901 Segmental and somatic dysfunction of cervical region: Secondary | ICD-10-CM | POA: Diagnosis not present

## 2017-05-08 DIAGNOSIS — M5416 Radiculopathy, lumbar region: Secondary | ICD-10-CM | POA: Diagnosis not present

## 2017-05-08 DIAGNOSIS — M9903 Segmental and somatic dysfunction of lumbar region: Secondary | ICD-10-CM | POA: Diagnosis not present

## 2017-05-16 DIAGNOSIS — M9903 Segmental and somatic dysfunction of lumbar region: Secondary | ICD-10-CM | POA: Diagnosis not present

## 2017-05-16 DIAGNOSIS — M5033 Other cervical disc degeneration, cervicothoracic region: Secondary | ICD-10-CM | POA: Diagnosis not present

## 2017-05-16 DIAGNOSIS — M9901 Segmental and somatic dysfunction of cervical region: Secondary | ICD-10-CM | POA: Diagnosis not present

## 2017-05-16 DIAGNOSIS — M5416 Radiculopathy, lumbar region: Secondary | ICD-10-CM | POA: Diagnosis not present

## 2017-05-21 DIAGNOSIS — M9901 Segmental and somatic dysfunction of cervical region: Secondary | ICD-10-CM | POA: Diagnosis not present

## 2017-05-21 DIAGNOSIS — M9903 Segmental and somatic dysfunction of lumbar region: Secondary | ICD-10-CM | POA: Diagnosis not present

## 2017-05-21 DIAGNOSIS — M5416 Radiculopathy, lumbar region: Secondary | ICD-10-CM | POA: Diagnosis not present

## 2017-05-21 DIAGNOSIS — M5033 Other cervical disc degeneration, cervicothoracic region: Secondary | ICD-10-CM | POA: Diagnosis not present

## 2017-05-26 ENCOUNTER — Other Ambulatory Visit: Payer: Self-pay | Admitting: Cardiovascular Disease

## 2017-05-26 DIAGNOSIS — I10 Essential (primary) hypertension: Secondary | ICD-10-CM

## 2017-05-27 ENCOUNTER — Telehealth: Payer: Self-pay | Admitting: Cardiovascular Disease

## 2017-05-27 DIAGNOSIS — I10 Essential (primary) hypertension: Secondary | ICD-10-CM

## 2017-05-27 NOTE — Telephone Encounter (Signed)
°*  STAT* If patient is at the pharmacy, call can be transferred to refill team.   1. Which medications need to be refilled? (please list name of each medication and dose if known) metoprolol   2. Which pharmacy/location (including street and city if local pharmacy) is medication to be sent to? [facility] on [address]   3. Do they need a 30 day or 90 day supply? 90 day

## 2017-05-28 MED ORDER — METOPROLOL SUCCINATE ER 25 MG PO TB24: 25.0000 mg | ORAL_TABLET | Freq: Every day | ORAL | 3 refills | Status: DC

## 2017-05-30 DIAGNOSIS — M9901 Segmental and somatic dysfunction of cervical region: Secondary | ICD-10-CM | POA: Diagnosis not present

## 2017-05-30 DIAGNOSIS — M5033 Other cervical disc degeneration, cervicothoracic region: Secondary | ICD-10-CM | POA: Diagnosis not present

## 2017-05-30 DIAGNOSIS — M9903 Segmental and somatic dysfunction of lumbar region: Secondary | ICD-10-CM | POA: Diagnosis not present

## 2017-05-30 DIAGNOSIS — M5416 Radiculopathy, lumbar region: Secondary | ICD-10-CM | POA: Diagnosis not present

## 2017-06-05 ENCOUNTER — Ambulatory Visit (INDEPENDENT_AMBULATORY_CARE_PROVIDER_SITE_OTHER): Payer: Medicare Other

## 2017-06-05 ENCOUNTER — Telehealth: Payer: Self-pay | Admitting: Urology

## 2017-06-05 VITALS — BP 159/76 | HR 66 | Ht 64.0 in | Wt 200.0 lb

## 2017-06-05 DIAGNOSIS — R300 Dysuria: Secondary | ICD-10-CM

## 2017-06-05 LAB — MICROSCOPIC EXAMINATION
Bacteria, UA: NONE SEEN
RBC MICROSCOPIC, UA: NONE SEEN /HPF (ref 0–?)

## 2017-06-05 LAB — URINALYSIS, COMPLETE
BILIRUBIN UA: NEGATIVE
GLUCOSE, UA: NEGATIVE
KETONES UA: NEGATIVE
NITRITE UA: NEGATIVE
Protein, UA: NEGATIVE
RBC UA: NEGATIVE
SPEC GRAV UA: 1.01 (ref 1.005–1.030)
UUROB: 0.2 mg/dL (ref 0.2–1.0)
pH, UA: 7 (ref 5.0–7.5)

## 2017-06-05 NOTE — Telephone Encounter (Signed)
Pt called office asking to please come to office, pt states she thinks she has a raging UTI, pain, burning, denies fever, chills, nausea, vomiting.  I have added her to the nurse schedule to come on in. FYI

## 2017-06-05 NOTE — Telephone Encounter (Signed)
Pt was added to nurse schedule for today.

## 2017-06-05 NOTE — Progress Notes (Signed)
Pt presents today with c/o urinary frequency and urgency, dysuria, back pain, and lower abd pain. A clean catch was obtained for u/a and cx.   Blood pressure (!) 159/76, pulse 66, height 5\' 4"  (1.626 m), weight 200 lb (90.7 kg).

## 2017-06-06 DIAGNOSIS — M9903 Segmental and somatic dysfunction of lumbar region: Secondary | ICD-10-CM | POA: Diagnosis not present

## 2017-06-06 DIAGNOSIS — M5416 Radiculopathy, lumbar region: Secondary | ICD-10-CM | POA: Diagnosis not present

## 2017-06-06 DIAGNOSIS — M9901 Segmental and somatic dysfunction of cervical region: Secondary | ICD-10-CM | POA: Diagnosis not present

## 2017-06-06 DIAGNOSIS — M5033 Other cervical disc degeneration, cervicothoracic region: Secondary | ICD-10-CM | POA: Diagnosis not present

## 2017-06-07 ENCOUNTER — Telehealth: Payer: Self-pay

## 2017-06-07 DIAGNOSIS — R109 Unspecified abdominal pain: Secondary | ICD-10-CM

## 2017-06-07 NOTE — Telephone Encounter (Signed)
That will be fine get obtain a KUB prior to her visit on the 20th.

## 2017-06-07 NOTE — Telephone Encounter (Signed)
Spoke with pt in reference to prelim results of ucx. Pt stated that she is feeling better which leads her to believe it may be a kidney stone. Pt inquired about getting imaging. Reinforced with pt to increase fluid in take in the mean time. Please advise.

## 2017-06-07 NOTE — Telephone Encounter (Signed)
-----   Message from Nori Riis, [name] sent at 06/07/2017 10:28 AM EST ----- Please let Mrs. Semper know that her preliminary report is negative for infection.  We should have the final results by Monday.

## 2017-06-08 LAB — CULTURE, URINE COMPREHENSIVE

## 2017-06-11 NOTE — Telephone Encounter (Signed)
LMOM- ucx is negative. Can get KUB prior to appt on 12/20. Orders placed.

## 2017-06-11 NOTE — Telephone Encounter (Signed)
-----   Message from Nori Riis, [name] sent at 06/10/2017  9:41 AM EST ----- Her urine culture is negative.

## 2017-06-12 DIAGNOSIS — M5033 Other cervical disc degeneration, cervicothoracic region: Secondary | ICD-10-CM | POA: Diagnosis not present

## 2017-06-12 DIAGNOSIS — M9901 Segmental and somatic dysfunction of cervical region: Secondary | ICD-10-CM | POA: Diagnosis not present

## 2017-06-12 DIAGNOSIS — M5416 Radiculopathy, lumbar region: Secondary | ICD-10-CM | POA: Diagnosis not present

## 2017-06-12 DIAGNOSIS — M9903 Segmental and somatic dysfunction of lumbar region: Secondary | ICD-10-CM | POA: Diagnosis not present

## 2017-06-13 ENCOUNTER — Ambulatory Visit
Admission: RE | Admit: 2017-06-13 | Discharge: 2017-06-13 | Disposition: A | Discharge status: Home / Self Care | Payer: Medicare Other | Source: Ambulatory Visit | Attending: Urology | Admitting: Urology

## 2017-06-13 DIAGNOSIS — R109 Unspecified abdominal pain: Secondary | ICD-10-CM | POA: Insufficient documentation

## 2017-06-14 ENCOUNTER — Telehealth: Payer: Self-pay

## 2017-06-14 DIAGNOSIS — D0462 Carcinoma in situ of skin of left upper limb, including shoulder: Secondary | ICD-10-CM | POA: Diagnosis not present

## 2017-06-14 NOTE — Telephone Encounter (Signed)
-----   Message from Nori Riis, [name] sent at 06/14/2017  8:00 AM EST ----- Please let Mrs. Reese know that no stone is seen on KUB.

## 2017-06-14 NOTE — Telephone Encounter (Signed)
LMOM- no stone on KUB

## 2017-06-14 NOTE — Progress Notes (Deleted)
06/17/2017 9:58 AM   Bonnie Rowland 04/13/1941 [id number]  Referring provider: Ashok Norris, MD No address on file  No chief complaint on file.   HPI: 76 yo DM WF with vaginal atrophy and urge incontinence who presents today for a one year follow up.    She contacted our office on 06/05/2017 for the complaints of frequency, urgency, dysuria, low back pain and lower abdominal pain.  Her UA was negative and so was her urine culture.  She then requested a KUB.  KUB taken on 06/13/2017 noted no stones.    Today, she is experiencing urgency x *** (***), frequency x *** (***), not/is restricting fluids to avoid visits to the restroom ***, not/is engaging in toilet mapping, incontinence x *** (***) and nocturia x *** (***).  Her PVR is ***.  She has not had fevers, chills, nausea or vomiting.  She denies dysuria, gross hematuria and suprapubic pain.     PMH: Past Medical History:  Diagnosis Date  . Acute cystitis   . Allergic rhinitis, cause unspecified   . 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 Bronchitis, not specified as acute or chronic   . 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 Cellulitis and abscess of leg, except foot   . Conjunctivitis unspecified   . Dermatophytosis of nail   . Diabetes mellitus    type II  . Esophageal reflux   . Hyperlipidemia   . Hypertension   . 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 Renal artery stenosis ([place])   . Sprain of lumbar region   . Thoracic or lumbosacral neuritis or radiculitis, unspecified   . Urinary tract infection, site not specified     Surgical History: Past Surgical History:  Procedure Laterality Date  . APPENDECTOMY    . CARDIAC CATHETERIZATION    . cataract surgery    . COLONOSCOPY    . COLONOSCOPY WITH PROPOFOL N/A 05/09/2016   Procedure: COLONOSCOPY WITH PROPOFOL;  Surgeon: Robert Bellow, MD;  Location: [facility] ENDOSCOPY;   Service: Endoscopy;  Laterality: N/A;  . DIAGNOSTIC MAMMOGRAM    . EYE SURGERY Bilateral    Cataract Extraction  . KNEE ARTHROPLASTY Right 03/30/2015   Procedure: COMPUTER ASSISTED TOTAL KNEE ARTHROPLASTY;  Surgeon: Dereck Leep, MD;  Location: [facility] ORS;  Service: Orthopedics;  Laterality: Right;  . KNEE ARTHROSCOPY Right   . KNEE CLOSED REDUCTION Right 05/23/2015   Procedure: CLOSED MANIPULATION KNEE;  Surgeon: Dereck Leep, MD;  Location: [facility] ORS;  Service: Orthopedics;  Laterality: Right;  . TONSILLECTOMY      Home Medications:  Allergies as of 06/17/2017      Reactions   Cyclobenzaprine Hypertension   Cheese Other (See Comments)   bloating   Ciprofloxacin Hcl Other (See Comments)   Muscle pain   Coconut Oil       Medication List        Accurate as of 06/14/17  9:58 AM. Always use your most recent med list.          ALFALFA PO Take 3 tablets by mouth every morning.   aspirin 81 MG EC tablet Take 81 mg by mouth at bedtime.   AVENOVA 0.01 % Soln See admin instructions.   B-complex with vitamin C tablet Take 2 tablets by mouth at bedtime.   BLACK ELDERBERRY(BERRY-FLOWER) PO Take by mouth.   calcium carbonate  600 MG Tabs tablet Commonly known as:  OS-CAL Take 600 mg by mouth daily.   cholecalciferol 1000 units tablet Commonly known as:  VITAMIN D Take 4,000 Units by mouth daily.   clobetasol ointment 0.05 % Commonly known as:  TEMOVATE Apply 1 application topically 2 (two) times daily as needed (bug bites). Reported on 06/30/2015   CoQ10 200 MG Caps Take 1 tablet by mouth daily. 400 mg every am.   DENTAGEL 1.1 % Gel dental gel Generic drug:  sodium fluoride   estradiol 0.1 MG/GM vaginal cream Commonly known as:  ESTRACE VAGINAL 1/2 gm once weekly using applicator, apply blueberry sized amount of cream using tip of finger to urethra twice weekly   Exenatide ER 2 MG Srer Inject 2 mg into the skin once a week.   fish oil-omega-3 fatty acids 1000  MG capsule Take 2 g by mouth daily.   fluconazole 150 MG tablet Commonly known as:  DIFLUCAN Take 1 tablet (150 mg total) by mouth every other day.   fluticasone 50 MCG/ACT nasal spray Commonly known as:  FLONASE PLACE 2 SPRAYS INTO BOTH NOSTRILS DAILY.   [place] by Does not apply route.   GARLIC 1962 PO Take 1 tablet by mouth daily. 1 gram   Glucosamine 500 MG Tabs Take 1 tablet by mouth daily.   glucose blood test strip Commonly known as:  ONE TOUCH ULTRA TEST 1 each by Other route 3 (three) times daily. Use as instructed   Insulin Glargine 100 UNIT/ML Solostar Pen Commonly known as:  LANTUS SOLOSTAR Inject 24 Units into the skin every morning.   insulin lispro 100 UNIT/ML KiwkPen Commonly known as:  HUMALOG Inject into the skin.   Insulin Pen Needle 32G X 4 MM Misc 1 pen by Does not apply route 2 (two) times daily. Use as directed 2 times daily   LECITHIN PO Take 1 capsule by mouth every other day.   loratadine 10 MG tablet Commonly known as:  CLARITIN Take 1 tablet (10 mg total) by mouth daily.   losartan-hydrochlorothiazide 100-25 MG tablet Commonly known as:  HYZAAR Take 1 tablet by mouth daily.   MAGNESIUM-POTASSIUM PO Take 1 tablet by mouth 2 (two) times daily. Reported on 06/30/2015   metoprolol succinate 25 MG 24 hr tablet Commonly known as:  TOPROL-XL Take 1 tablet (25 mg total) by mouth daily.   nystatin cream Commonly known as:  MYCOSTATIN Apply 1 application topically 2 (two) times daily.   onetouch ultrasoft lancets Use as instructed   PRESERVISION AREDS 2 Caps Take 1 capsule by mouth 2 (two) times daily.   PROBIOTIC PO Take 1 Dose by mouth at bedtime.   raloxifene 60 MG tablet Commonly known as:  EVISTA Take 1 tablet (60 mg total) by mouth every morning.   rosuvastatin 10 MG tablet Commonly known as:  CRESTOR Take 1 tablet (10 mg total) by mouth daily.   sulfacetamide 10 % ophthalmic  solution Commonly known as:  BLEPH-10 Place 1 drop into both eyes 3 (three) times daily as needed.   SYSTANE BALANCE OP Apply 1 drop to eye as needed.   triamcinolone cream 0.1 % Commonly known as:  KENALOG Apply 1 application topically 2 (two) times daily.   vitamin E 400 UNIT capsule Generic drug:  vitamin E Take 400 Units by mouth daily. Every am       Allergies:  Allergies  Allergen Reactions  . Cyclobenzaprine Hypertension  . Cheese Other (See Comments)  bloating  . Ciprofloxacin Hcl Other (See Comments)    Muscle pain  . Coconut Oil     Family History: Family History  Problem Relation Age of Onset  . Diabetes Mother   . Hypertension Mother   . Cancer Mother   . Hypertension Father   . Cancer Father        Prostate  . Breast cancer Maternal Aunt 24  . Colon cancer Son   . Kidney cancer Neg Hx   . Bladder Cancer Neg Hx     Social History:  reports that  has never smoked. she has never used smokeless tobacco. She reports that she drinks alcohol. She reports that she does not use drugs.  ROS:                                        Physical Exam: There were no vitals taken for this visit.  Constitutional: Well nourished. Alert and oriented, No acute distress. HEENT: [place] AT, moist mucus membranes. Trachea midline, no masses. Cardiovascular: No clubbing, cyanosis, or edema. Respiratory: Normal respiratory effort, no increased work of breathing. GI: Abdomen is soft, non tender, non distended, no abdominal masses. Liver and spleen not palpable.  No hernias appreciated.  Stool sample for occult testing is not indicated.   GU: No CVA tenderness.  No bladder fullness or masses.  Normal external genitalia, normal pubic hair distribution, no lesions.  Normal urethral meatus, no lesions, no prolapse, no discharge.   No urethral masses, tenderness and/or tenderness. No bladder fullness, tenderness or masses. Normal vagina mucosa, good estrogen  effect, no discharge, no lesions, good pelvic support, no cystocele or rectocele noted.  No cervical motion tenderness.  Uterus is freely mobile and non-fixed.  No adnexal/parametria masses or tenderness noted.  Anus and perineum are without rashes or lesions.   *** Skin: No rashes, bruises or suspicious lesions. Lymph: No cervical or inguinal adenopathy. Neurologic: Grossly intact, no focal deficits, moving all 4 extremities. Psychiatric: Normal mood and affect.  Laboratory Data: Results for orders placed or performed in visit on 06/05/17  CULTURE, URINE COMPREHENSIVE  Result Value Ref Range   Urine Culture, Comprehensive Final report    Organism ID, Bacteria Comment   Microscopic Examination  Result Value Ref Range   WBC, UA 0-5 0 - 5 /hpf   RBC, UA None seen 0 - 2 /hpf   Epithelial Cells (non renal) 0-10 0 - 10 /hpf   Bacteria, UA None seen None seen/Few   Yeast, UA Present (A) None seen  Urinalysis, Complete  Result Value Ref Range   Specific Gravity, UA 1.010 1.005 - 1.030   pH, UA 7.0 5.0 - 7.5   Color, UA Yellow Yellow   Appearance Ur Clear Clear   Leukocytes, UA Trace (A) Negative   Protein, UA Negative Negative/Trace   Glucose, UA Negative Negative   Ketones, UA Negative Negative   RBC, UA Negative Negative   Bilirubin, UA Negative Negative   Urobilinogen, Ur 0.2 0.2 - 1.0 mg/dL   Nitrite, UA Negative Negative   Microscopic Examination See below:    I have reviewed the labs.  Pertinent Imaging CLINICAL DATA:  Left-sided flank pain for 2 weeks, initial encounter  EXAM: ABDOMEN - 1 VIEW  COMPARISON:  None.  FINDINGS: Scattered large and small bowel gas is noted. No abnormal mass or abnormal calcifications are noted. Degenerative changes of  the lumbar spine are seen  IMPRESSION: No acute abnormality noted.   Electronically Signed   By: Inez Catalina M.D.   On: 06/14/2017 07:39    1. Vaginal Atrophy- Patient will continue vaginal estrogen cream  1/2 g vaginally  weekly as well as blueberry sized amount of cream applied to urethra with finger twice weekly at bedtime. She denies any adverse reactions. No vaginal bleeding. Samples given.    2. Urge incontinence  - minimal bother at [place] time  - continue to monitor     No Follow-up on file.  These notes generated with voice recognition software. I apologize for typographical errors.  Zara Council, [facility] [address] [place], [place] [address] [phone]

## 2017-06-17 ENCOUNTER — Ambulatory Visit: Payer: Medicare Other | Admitting: Urology

## 2017-06-17 ENCOUNTER — Encounter: Payer: Self-pay | Admitting: Urology

## 2017-06-20 ENCOUNTER — Ambulatory Visit (INDEPENDENT_AMBULATORY_CARE_PROVIDER_SITE_OTHER): Payer: Medicare Other | Admitting: Urology

## 2017-06-20 ENCOUNTER — Ambulatory Visit: Payer: Medicare Other | Admitting: Urology

## 2017-06-20 ENCOUNTER — Encounter: Payer: Self-pay | Admitting: Urology

## 2017-06-20 VITALS — BP 129/75 | HR 71 | Ht 64.0 in | Wt 204.4 lb

## 2017-06-20 DIAGNOSIS — R1032 Left lower quadrant pain: Secondary | ICD-10-CM | POA: Diagnosis not present

## 2017-06-20 DIAGNOSIS — N952 Postmenopausal atrophic vaginitis: Secondary | ICD-10-CM | POA: Diagnosis not present

## 2017-06-20 DIAGNOSIS — I701 Atherosclerosis of renal artery: Secondary | ICD-10-CM

## 2017-06-20 DIAGNOSIS — R300 Dysuria: Secondary | ICD-10-CM

## 2017-06-20 LAB — URINALYSIS, COMPLETE
Bilirubin, UA: NEGATIVE
Ketones, UA: NEGATIVE
Leukocytes, UA: NEGATIVE
Nitrite, UA: NEGATIVE
PH UA: 6.5 (ref 5.0–7.5)
Protein, UA: NEGATIVE
RBC, UA: NEGATIVE
Specific Gravity, UA: 1.01 (ref 1.005–1.030)
UUROB: 0.2 mg/dL (ref 0.2–1.0)

## 2017-06-20 MED ORDER — ESTRADIOL 0.1 MG/GM VA CREA: TOPICAL_CREAM | VAGINAL | 4 refills | Status: DC

## 2017-06-20 NOTE — Progress Notes (Signed)
06/20/2017 2:47 PM   Bonnie Rowland Feb 11, 1941 [id number]  Referring provider: Steele Sizer, MD [address] [place], [place] [address]  Chief Complaint  Patient presents with  . Dysuria    HPI: 76 yo DM WF with vaginal atrophy and urge incontinence who presents today for a one year follow up.    She contacted our office on 06/05/2017 for the complaints of frequency, urgency, dysuria, low back pain and lower abdominal pain.  Her UA was negative and so was her urine culture.  She then requested a KUB.  KUB taken on 06/13/2017 noted no stones.    Today, she is experiencing urgency x 4-7, frequency x 8 or more, not restricting fluids to avoid visits to the restroom, is engaging in toilet mapping, incontinence x 3-4 and nocturia x 0-3.  Her PVR is 0 mL.  She has not had fevers, chills, nausea or vomiting.  She denies dysuria, gross hematuria and suprapubic pain.   She is very concerned about her urinary symptoms and demanded a repeat UA.  Her UA is still negative.    She states that she had started to experience a pain from left labia to left lower abdomen.  It has an occasion radiated to the left flank.  Pain ranges from 3 -4 to 7 at its worse.  She describes it as a burning pain.  Nothing make the pain worse.  Tylenol and ibuprofen seems to help.  Pain is lasting for a day.   She has not seen a rash.  She has a remote history of stones.     PMH: Past Medical History:  Diagnosis Date  . Acute cystitis   . Allergic rhinitis, cause unspecified   . 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 Bronchitis, not specified as acute or chronic   . 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 Cellulitis and abscess of leg, except foot   . Conjunctivitis unspecified   . Dermatophytosis of nail   . Diabetes mellitus    type II  . Esophageal reflux   . Hyperlipidemia   . Hypertension   . 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 Renal artery stenosis ([place])   . Sprain of lumbar region   . Thoracic or lumbosacral neuritis or radiculitis, unspecified   . Urinary tract infection, site not specified     Surgical History: Past Surgical History:  Procedure Laterality Date  . APPENDECTOMY    . CARDIAC CATHETERIZATION    . cataract surgery    . COLONOSCOPY    . COLONOSCOPY WITH PROPOFOL N/A 05/09/2016   Procedure: COLONOSCOPY WITH PROPOFOL;  Surgeon: Robert Bellow, MD;  Location: [facility] ENDOSCOPY;  Service: Endoscopy;  Laterality: N/A;  . DIAGNOSTIC MAMMOGRAM    . EYE SURGERY Bilateral    Cataract Extraction  . KNEE ARTHROPLASTY Right 03/30/2015   Procedure: COMPUTER ASSISTED TOTAL KNEE ARTHROPLASTY;  Surgeon: Dereck Leep, MD;  Location: [facility] ORS;  Service: Orthopedics;  Laterality: Right;  . KNEE ARTHROSCOPY Right   . KNEE CLOSED REDUCTION Right 05/23/2015   Procedure: CLOSED MANIPULATION KNEE;  Surgeon: Dereck Leep, MD;  Location: [facility] ORS;  Service: Orthopedics;  Laterality: Right;  . TONSILLECTOMY      Home Medications:  Allergies as of 06/20/2017      Reactions   Cyclobenzaprine Hypertension   Cheese Other (See Comments)  bloating   Ciprofloxacin Hcl Other (See Comments)   Muscle pain   Coconut Oil       Medication List        Accurate as of 06/20/17  2:47 PM. Always use your most recent med list.          ALFALFA PO Take 3 tablets by mouth every morning.   aspirin 81 MG EC tablet Take 81 mg by mouth at bedtime.   AVENOVA 0.01 % Soln See admin instructions.   B-complex with vitamin C tablet Take 2 tablets by mouth at bedtime.   BLACK ELDERBERRY(BERRY-FLOWER) PO Take by mouth.   calcium carbonate 600 MG Tabs tablet Commonly known as:  OS-CAL Take 600 mg by mouth daily.   cholecalciferol 1000 units tablet Commonly known as:  VITAMIN D Take 4,000 Units by mouth daily.   clobetasol ointment 0.05 % Commonly known as:  TEMOVATE Apply 1  application topically 2 (two) times daily as needed (bug bites). Reported on 06/30/2015   CoQ10 200 MG Caps Take 1 tablet by mouth daily. 400 mg every am.   DENTAGEL 1.1 % Gel dental gel Generic drug:  sodium fluoride   estradiol 0.1 MG/GM vaginal cream Commonly known as:  ESTRACE VAGINAL 1/2 gm once weekly using applicator, apply blueberry sized amount of cream using tip of finger to urethra twice weekly   Exenatide ER 2 MG Srer Inject 2 mg into the skin once a week.   fish oil-omega-3 fatty acids 1000 MG capsule Take 2 g by mouth daily.   fluconazole 150 MG tablet Commonly known as:  DIFLUCAN Take 1 tablet (150 mg total) by mouth every other day.   fluticasone 50 MCG/ACT nasal spray Commonly known as:  FLONASE PLACE 2 SPRAYS INTO BOTH NOSTRILS DAILY.   [place] by Does not apply route.   GARLIC 7371 PO Take 1 tablet by mouth daily. 1 gram   Glucosamine 500 MG Tabs Take 1 tablet by mouth daily.   glucose blood test strip Commonly known as:  ONE TOUCH ULTRA TEST 1 each by Other route 3 (three) times daily. Use as instructed   Insulin Glargine 100 UNIT/ML Solostar Pen Commonly known as:  LANTUS SOLOSTAR Inject 24 Units into the skin every morning.   insulin lispro 100 UNIT/ML KiwkPen Commonly known as:  HUMALOG Inject into the skin.   Insulin Pen Needle 32G X 4 MM Misc 1 pen by Does not apply route 2 (two) times daily. Use as directed 2 times daily   loratadine 10 MG tablet Commonly known as:  CLARITIN Take 1 tablet (10 mg total) by mouth daily.   losartan-hydrochlorothiazide 100-25 MG tablet Commonly known as:  HYZAAR Take 1 tablet by mouth daily.   MAGNESIUM-POTASSIUM PO Take 1 tablet by mouth 2 (two) times daily. Reported on 06/30/2015   metoprolol succinate 25 MG 24 hr tablet Commonly known as:  TOPROL-XL Take 1 tablet (25 mg total) by mouth daily.   nystatin cream Commonly known as:  MYCOSTATIN Apply 1 application  topically 2 (two) times daily.   onetouch ultrasoft lancets Use as instructed   PRESERVISION AREDS 2 Caps Take 1 capsule by mouth 2 (two) times daily.   PROBIOTIC PO Take 1 Dose by mouth at bedtime.   raloxifene 60 MG tablet Commonly known as:  EVISTA Take 1 tablet (60 mg total) by mouth every morning.   rosuvastatin 10 MG tablet Commonly known as:  CRESTOR Take 1 tablet (10 mg total)  by mouth daily.   sulfacetamide 10 % ophthalmic solution Commonly known as:  BLEPH-10 Place 1 drop into both eyes 3 (three) times daily as needed.   SYSTANE BALANCE OP Apply 1 drop to eye as needed.   triamcinolone cream 0.1 % Commonly known as:  KENALOG Apply 1 application topically 2 (two) times daily.   vitamin E 400 UNIT capsule Generic drug:  vitamin E Take 400 Units by mouth daily. Every am       Allergies:  Allergies  Allergen Reactions  . Cyclobenzaprine Hypertension  . Cheese Other (See Comments)    bloating  . Ciprofloxacin Hcl Other (See Comments)    Muscle pain  . Coconut Oil     Family History: Family History  Problem Relation Age of Onset  . Diabetes Mother   . Hypertension Mother   . Cancer Mother   . Hypertension Father   . Cancer Father        Prostate  . Breast cancer Maternal Aunt 48  . Colon cancer Son   . Kidney cancer Neg Hx   . Bladder Cancer Neg Hx     Social History:  reports that  has never smoked. she has never used smokeless tobacco. She reports that she drinks alcohol. She reports that she does not use drugs.  ROS: UROLOGY Frequent Urination?: No Hard to postpone urination?: Yes Burning/pain with urination?: Yes Get up at night to urinate?: Yes Leakage of urine?: No Urine stream starts and stops?: No Trouble starting stream?: No Do you have to strain to urinate?: No Blood in urine?: No Urinary tract infection?: Yes Sexually transmitted disease?: No Injury to kidneys or bladder?: No Painful intercourse?: No Weak stream?:  No Currently pregnant?: No Vaginal bleeding?: No Last menstrual period?: n  Gastrointestinal Nausea?: No Vomiting?: No Indigestion/heartburn?: No Diarrhea?: No Constipation?: No  Constitutional Fever: No Night sweats?: No Weight loss?: No Fatigue?: No  Skin Skin rash/lesions?: No Itching?: No  Eyes Blurred vision?: No Double vision?: No  Ears/Nose/Throat Sore throat?: No Sinus problems?: No  Hematologic/Lymphatic Swollen glands?: No Easy bruising?: No  Cardiovascular Leg swelling?: No Chest pain?: No  Respiratory Cough?: No Shortness of breath?: No  Endocrine Excessive thirst?: No  Musculoskeletal Back pain?: No Joint pain?: No  Neurological Headaches?: No Dizziness?: No  Psychologic Depression?: No Anxiety?: No  Physical Exam: BP 129/75   Pulse 71   Ht 5\' 4"  (1.626 m)   Wt 204 lb 6.4 oz (92.7 kg)   BMI 35.09 kg/m   Constitutional: Well nourished. Alert and oriented, No acute distress. HEENT: [place] AT, moist mucus membranes. Trachea midline, no masses. Cardiovascular: No clubbing, cyanosis, or edema. Respiratory: Normal respiratory effort, no increased work of breathing. GI: Abdomen is obese, soft, mild left lower quadrant pain, no guarding, no rebound tenderness, non distended, no abdominal masses. Liver and spleen not palpable.  No hernias appreciated.  Stool sample for occult testing is not indicated.   GU: No CVA tenderness.  No bladder fullness or masses.  Atrophic external genitalia, normal pubic hair distribution, no lesions.  Normal urethral meatus, no lesions, no prolapse, no discharge.   No urethral masses, tenderness and/or tenderness. No bladder fullness, tenderness or masses. Pale vagina mucosa, good estrogen effect, no discharge, no lesions, good pelvic support, mildly tender in the left levator ani, no cystocele or rectocele noted.  No cervical motion tenderness.  Uterus is freely mobile and non-fixed.  No adnexal/parametria masses or  tenderness noted.  Anus and perineum are  without rashes or lesions.    Skin: No rashes, bruises or suspicious lesions. Lymph: No cervical or inguinal adenopathy. Neurologic: Grossly intact, no focal deficits, moving all 4 extremities. Psychiatric: Normal mood and affect.  Laboratory Data: Results for orders placed or performed in visit on 06/05/17  CULTURE, URINE COMPREHENSIVE  Result Value Ref Range   Urine Culture, Comprehensive Final report    Organism ID, Bacteria Comment   Microscopic Examination  Result Value Ref Range   WBC, UA 0-5 0 - 5 /hpf   RBC, UA None seen 0 - 2 /hpf   Epithelial Cells (non renal) 0-10 0 - 10 /hpf   Bacteria, UA None seen None seen/Few   Yeast, UA Present (A) None seen  Urinalysis, Complete  Result Value Ref Range   Specific Gravity, UA 1.010 1.005 - 1.030   pH, UA 7.0 5.0 - 7.5   Color, UA Yellow Yellow   Appearance Ur Clear Clear   Leukocytes, UA Trace (A) Negative   Protein, UA Negative Negative/Trace   Glucose, UA Negative Negative   Ketones, UA Negative Negative   RBC, UA Negative Negative   Bilirubin, UA Negative Negative   Urobilinogen, Ur 0.2 0.2 - 1.0 mg/dL   Nitrite, UA Negative Negative   Microscopic Examination See below:    I have reviewed the labs.  Pertinent Imaging CLINICAL DATA:  Left-sided flank pain for 2 weeks, initial encounter  EXAM: ABDOMEN - 1 VIEW  COMPARISON:  None.  FINDINGS: Scattered large and small bowel gas is noted. No abnormal mass or abnormal calcifications are noted. Degenerative changes of the lumbar spine are seen  IMPRESSION: No acute abnormality noted.   Electronically Signed   By: Inez Catalina M.D.   On: 06/14/2017 07:39  I have independently reviewed the images  1. Left lower quadrant  - schedule CT Renal stone protocol  2. Vaginal Atrophy- Patient will continue vaginal estrogen cream 1/2 g vaginally  weekly as well as blueberry sized amount of cream applied to urethra with  finger twice weekly at bedtime. She denies any adverse reactions. No vaginal bleeding. Refill given.    3. Dysuria  - ? Vaginal burning vs GU burning  - UA is clear  - continue to monitor - if CT is negative, may need to pursue a cystoscopy   Return for CT report.  These notes generated with voice recognition software. I apologize for typographical errors.  Zara Council, [facility] [address] [place], [place] [address] [phone]

## 2017-06-27 DIAGNOSIS — M5033 Other cervical disc degeneration, cervicothoracic region: Secondary | ICD-10-CM | POA: Diagnosis not present

## 2017-06-27 DIAGNOSIS — M9901 Segmental and somatic dysfunction of cervical region: Secondary | ICD-10-CM | POA: Diagnosis not present

## 2017-06-27 DIAGNOSIS — M9903 Segmental and somatic dysfunction of lumbar region: Secondary | ICD-10-CM | POA: Diagnosis not present

## 2017-06-27 DIAGNOSIS — M5416 Radiculopathy, lumbar region: Secondary | ICD-10-CM | POA: Diagnosis not present

## 2017-06-28 ENCOUNTER — Ambulatory Visit
Admission: RE | Admit: 2017-06-28 | Discharge: 2017-06-28 | Disposition: A | Discharge status: Home / Self Care | Payer: Medicare Other | Source: Ambulatory Visit | Attending: Urology | Admitting: Urology

## 2017-06-28 DIAGNOSIS — R1032 Left lower quadrant pain: Secondary | ICD-10-CM | POA: Diagnosis not present

## 2017-06-28 DIAGNOSIS — I700 Atherosclerosis of aorta: Secondary | ICD-10-CM | POA: Diagnosis not present

## 2017-06-28 DIAGNOSIS — N200 Calculus of kidney: Secondary | ICD-10-CM | POA: Diagnosis not present

## 2017-06-28 DIAGNOSIS — K8020 Calculus of gallbladder without cholecystitis without obstruction: Secondary | ICD-10-CM | POA: Insufficient documentation

## 2017-06-28 DIAGNOSIS — K449 Diaphragmatic hernia without obstruction or gangrene: Secondary | ICD-10-CM | POA: Diagnosis not present

## 2017-07-04 DIAGNOSIS — M5033 Other cervical disc degeneration, cervicothoracic region: Secondary | ICD-10-CM | POA: Diagnosis not present

## 2017-07-04 DIAGNOSIS — M5416 Radiculopathy, lumbar region: Secondary | ICD-10-CM | POA: Diagnosis not present

## 2017-07-04 DIAGNOSIS — M9901 Segmental and somatic dysfunction of cervical region: Secondary | ICD-10-CM | POA: Diagnosis not present

## 2017-07-04 DIAGNOSIS — M9903 Segmental and somatic dysfunction of lumbar region: Secondary | ICD-10-CM | POA: Diagnosis not present

## 2017-07-08 NOTE — Progress Notes (Signed)
07/09/2017 10:34 AM   Bonnie Rowland [birth year] [id number]  Referring provider: Steele Sizer, MD [address] [place], [place] [address]  Chief Complaint  Patient presents with  . Follow-up    CT results    HPI: 77 yo DM WF who presents today to discuss her CT Renal stone study report performed to evaluate left lower quadrant pain.  Background history 77 yo DM WF with vaginal atrophy and urge incontinence who presents today for a one year follow up.   She contacted our office on 06/05/2017 for the complaints of frequency, urgency, dysuria, low back pain and lower abdominal pain.  Her UA was negative and so was her urine culture.  She then requested a KUB.  KUB taken on 06/13/2017 noted no stones.   Today, she is experiencing urgency x 4-7, frequency x 8 or more, not restricting fluids to avoid visits to the restroom, is engaging in toilet mapping, incontinence x 3-4 and nocturia x 0-3.  Her PVR is 0 mL.  She has not had fevers, chills, nausea or vomiting.  She denies dysuria, gross hematuria and suprapubic pain.   She is very concerned about her urinary symptoms and demanded a repeat UA.  Her UA is still negative.   She states that she had started to experience a pain from left labia to left lower abdomen.  It has an occasion radiated to the left flank.  Pain ranges from 3 -4 to 7 at its worse.  She describes it as a burning pain.  Nothing make the pain worse.  Tylenol and ibuprofen seems to help.  Pain is lasting for a day.   She has not seen a rash.  She has a remote history of stones.    CT Renal stone study performed 06/28/2017 noted nonobstructing left nephrolithiasis. No hydronephrosis. No ureteral or bladder stones.  No acute abnormality. No evidence of bowel obstruction or acute bowel inflammation.  Chronic findings include: Cholelithiasis. Aortic Atherosclerosis.  Small hiatal hernia.  Today, she is experiencing frequency, burning (located in the LLQ, she states that  sometimes it is associated and worsens with urination and sometime it is not associated with urination) and nocturia (she is drinking water at night due to the air being dry).  She has not seen blood in her urine.  She has not had fevers, chills, nausea or vomiting.     PMH: Past Medical History:  Diagnosis Date  . Acute cystitis   . Allergic rhinitis, cause unspecified   . 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 Bronchitis, not specified as acute or chronic   . 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 Cellulitis and abscess of leg, except foot   . Conjunctivitis unspecified   . Dermatophytosis of nail   . Diabetes mellitus    type II  . Esophageal reflux   . Hyperlipidemia   . Hypertension   . 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 Renal artery stenosis ([place])   . Sprain of lumbar region   . Thoracic or lumbosacral neuritis or radiculitis, unspecified   . Urinary tract infection, site not specified     Surgical History: Past Surgical History:  Procedure Laterality Date  . APPENDECTOMY    . CARDIAC CATHETERIZATION    . cataract surgery    . COLONOSCOPY    . COLONOSCOPY WITH PROPOFOL N/A 05/09/2016  Procedure: COLONOSCOPY WITH PROPOFOL;  Surgeon: Robert Bellow, MD;  Location: [facility] ENDOSCOPY;  Service: Endoscopy;  Laterality: N/A;  . DIAGNOSTIC MAMMOGRAM    . EYE SURGERY Bilateral    Cataract Extraction  . KNEE ARTHROPLASTY Right 03/30/2015   Procedure: COMPUTER ASSISTED TOTAL KNEE ARTHROPLASTY;  Surgeon: Dereck Leep, MD;  Location: [facility] ORS;  Service: Orthopedics;  Laterality: Right;  . KNEE ARTHROSCOPY Right   . KNEE CLOSED REDUCTION Right 05/23/2015   Procedure: CLOSED MANIPULATION KNEE;  Surgeon: Dereck Leep, MD;  Location: [facility] ORS;  Service: Orthopedics;  Laterality: Right;  . TONSILLECTOMY      Home Medications:  Allergies as of 07/09/2017      Reactions   Cyclobenzaprine  Hypertension   Cheese Other (See Comments)   bloating   Ciprofloxacin Hcl Other (See Comments)   Muscle pain   Coconut Oil       Medication List        Accurate as of 07/09/17 10:34 AM. Always use your most recent med list.          ALFALFA PO Take 3 tablets by mouth every morning.   aspirin 81 MG EC tablet Take 81 mg by mouth at bedtime.   AVENOVA 0.01 % Soln See admin instructions.   B-complex with vitamin C tablet Take 2 tablets by mouth at bedtime.   BLACK ELDERBERRY(BERRY-FLOWER) PO Take by mouth.   calcium carbonate 600 MG Tabs tablet Commonly known as:  OS-CAL Take 600 mg by mouth daily.   cholecalciferol 1000 units tablet Commonly known as:  VITAMIN D Take 4,000 Units by mouth daily.   clobetasol ointment 0.05 % Commonly known as:  TEMOVATE Apply 1 application topically 2 (two) times daily as needed (bug bites). Reported on 06/30/2015   CoQ10 200 MG Caps Take 1 tablet by mouth daily. 400 mg every am.   DENTAGEL 1.1 % Gel dental gel Generic drug:  sodium fluoride   estradiol 0.1 MG/GM vaginal cream Commonly known as:  ESTRACE VAGINAL 1/2 gm once weekly using applicator, apply blueberry sized amount of cream using tip of finger to urethra twice weekly   Exenatide ER 2 MG Srer Inject 2 mg into the skin once a week.   fish oil-omega-3 fatty acids 1000 MG capsule Take 2 g by mouth daily.   fluticasone 50 MCG/ACT nasal spray Commonly known as:  FLONASE PLACE 2 SPRAYS INTO BOTH NOSTRILS DAILY.   Page by Does not apply route.   GARLIC 4742 PO Take 1 tablet by mouth daily. 1 gram   Glucosamine 500 MG Tabs Take 1 tablet by mouth daily.   glucose blood test strip Commonly known as:  ONE TOUCH ULTRA TEST 1 each by Other route 3 (three) times daily. Use as instructed   Insulin Glargine 100 UNIT/ML Solostar Pen Commonly known as:  LANTUS SOLOSTAR Inject 24 Units into the skin every morning.   insulin lispro 100  UNIT/ML KiwkPen Commonly known as:  HUMALOG Inject into the skin.   Insulin Pen Needle 32G X 4 MM Misc 1 pen by Does not apply route 2 (two) times daily. Use as directed 2 times daily   loratadine 10 MG tablet Commonly known as:  CLARITIN Take 1 tablet (10 mg total) by mouth daily.   losartan-hydrochlorothiazide 100-25 MG tablet Commonly known as:  HYZAAR Take 1 tablet by mouth daily.   MAGNESIUM-POTASSIUM PO Take 1 tablet by mouth 2 (two) times daily. Reported on 06/30/2015  metoprolol succinate 25 MG 24 hr tablet Commonly known as:  TOPROL-XL Take 1 tablet (25 mg total) by mouth daily.   nystatin cream Commonly known as:  MYCOSTATIN Apply 1 application topically 2 (two) times daily.   onetouch ultrasoft lancets Use as instructed   PRESERVISION AREDS 2 Caps Take 1 capsule by mouth 2 (two) times daily.   PROBIOTIC PO Take 1 Dose by mouth at bedtime.   raloxifene 60 MG tablet Commonly known as:  EVISTA Take 1 tablet (60 mg total) by mouth every morning.   rosuvastatin 10 MG tablet Commonly known as:  CRESTOR Take 1 tablet (10 mg total) by mouth daily.   sulfacetamide 10 % ophthalmic solution Commonly known as:  BLEPH-10 Place 1 drop into both eyes 3 (three) times daily as needed.   SYSTANE BALANCE OP Apply 1 drop to eye as needed.   triamcinolone cream 0.1 % Commonly known as:  KENALOG Apply 1 application topically 2 (two) times daily.   vitamin E 400 UNIT capsule Generic drug:  vitamin E Take 400 Units by mouth daily. Every am       Allergies:  Allergies  Allergen Reactions  . Cyclobenzaprine Hypertension  . Cheese Other (See Comments)    bloating  . Ciprofloxacin Hcl Other (See Comments)    Muscle pain  . Coconut Oil     Family History: Family History  Problem Relation Age of Onset  . Diabetes Mother   . Hypertension Mother   . Cancer Mother   . Hypertension Father   . Cancer Father        Prostate  . Breast cancer Maternal Aunt 82    . Colon cancer Son   . Kidney cancer Neg Hx   . Bladder Cancer Neg Hx     Social History:  reports that  has never smoked. she has never used smokeless tobacco. She reports that she drinks alcohol. She reports that she does not use drugs.  ROS: UROLOGY Frequent Urination?: Yes Hard to postpone urination?: No Burning/pain with urination?: Yes Get up at night to urinate?: Yes Leakage of urine?: No Urine stream starts and stops?: No Trouble starting stream?: No Do you have to strain to urinate?: No Blood in urine?: No Urinary tract infection?: No Sexually transmitted disease?: No Injury to kidneys or bladder?: No Painful intercourse?: No Weak stream?: No Currently pregnant?: No Vaginal bleeding?: No Last menstrual period?: n  Gastrointestinal Nausea?: No Vomiting?: No Indigestion/heartburn?: No Diarrhea?: No Constipation?: No  Constitutional Fever: No Night sweats?: No Weight loss?: No Fatigue?: No  Skin Skin rash/lesions?: No Itching?: No  Eyes Blurred vision?: No Double vision?: No  Ears/Nose/Throat Sore throat?: No Sinus problems?: No  Hematologic/Lymphatic Swollen glands?: No Easy bruising?: No  Cardiovascular Leg swelling?: No Chest pain?: No  Respiratory Cough?: No Shortness of breath?: No  Endocrine Excessive thirst?: No  Musculoskeletal Back pain?: No Joint pain?: No  Neurological Headaches?: No Dizziness?: No  Psychologic Depression?: No Anxiety?: No  Physical Exam: BP 140/85 (BP Location: Right Arm, Patient Position: Sitting, Cuff Size: Large)   Pulse 65   Ht 5\' 4"  (1.626 m)   Wt 201 lb 11.2 oz (91.5 kg)   BMI 34.62 kg/m   Constitutional: Well nourished. Alert and oriented, No acute distress. HEENT: [place] AT, moist mucus membranes. Trachea midline, no masses. Cardiovascular: No clubbing, cyanosis, or edema. Respiratory: Normal respiratory effort, no increased work of breathing. Skin: No rashes, bruises or suspicious  lesions. Lymph: No cervical or inguinal  adenopathy. Neurologic: Grossly intact, no focal deficits, moving all 4 extremities. Psychiatric: Normal mood and affect.   Laboratory Data: Results for orders placed or performed in visit on 06/20/17  Urinalysis, Complete  Result Value Ref Range   Specific Gravity, UA 1.010 1.005 - 1.030   pH, UA 6.5 5.0 - 7.5   Color, UA Yellow Yellow   Appearance Ur Clear Clear   Leukocytes, UA Negative Negative   Protein, UA Negative Negative/Trace   Glucose, UA 3+ (A) Negative   Ketones, UA Negative Negative   RBC, UA Negative Negative   Bilirubin, UA Negative Negative   Urobilinogen, Ur 0.2 0.2 - 1.0 mg/dL   Nitrite, UA Negative Negative   I have reviewed the labs.  Pertinent Imaging CLINICAL DATA:  Left lower quadrant/flank pain for 1 month. History of nephrolithiasis. Prior appendectomy.  EXAM: CT ABDOMEN AND PELVIS WITHOUT CONTRAST  TECHNIQUE: Multidetector CT imaging of the abdomen and pelvis was performed following the standard protocol without IV contrast.  COMPARISON:  06/13/2017 abdominal radiographs.  FINDINGS: Lower chest: No significant pulmonary nodules or acute consolidative airspace disease.  Hepatobiliary: Normal liver size. No liver mass. Tiny layering gallstones in the nondistended gallbladder, with no gallbladder wall thickening or pericholecystic fluid. No biliary ductal dilatation.  Pancreas: Normal, with no mass or duct dilation.  Spleen: Normal size. No mass.  Adrenals/Urinary Tract: No discrete adrenal nodules. Nonobstructing 3 mm upper left renal stone. No additional renal stones. No hydronephrosis. Normal caliber ureters, with no ureteral stones. Simple 1.4 cm posterior lower left renal cyst. Otherwise no contour deforming renal lesions. Normal nondistended bladder.  Stomach/Bowel: Small hiatal hernia. Otherwise normal nondistended stomach. Normal caliber small bowel with no small bowel  wall thickening. Appendectomy. Normal large bowel with no diverticulosis, large bowel wall thickening or pericolonic fat stranding.  Vascular/Lymphatic: Atherosclerotic nonaneurysmal abdominal aorta. No pathologically enlarged lymph nodes in the abdomen or pelvis.  Reproductive: Grossly normal uterus.  No adnexal mass.  Other: No pneumoperitoneum, ascites or focal fluid collection. Nonspecific subcutaneous fat stranding in the bilateral ventral abdominal wall.  Musculoskeletal: No aggressive appearing focal osseous lesions. Marked thoracolumbar spondylosis.  IMPRESSION: 1. Nonobstructing left nephrolithiasis. No hydronephrosis. No ureteral or bladder stones. 2. No acute abnormality. No evidence of bowel obstruction or acute bowel inflammation. 3. Chronic findings include: Cholelithiasis. Aortic Atherosclerosis (ICD10-I70.0). Small hiatal hernia.   Electronically Signed   By: Ilona Sorrel M.D.   On: 06/28/2017 14:07  I have independently reviewed the images  1. Left lower quadrant pain  - CT Renal stone study did not identify an etiology for her pain  2. Left renal stone  - no intervention warranted at this time  3. Vaginal Atrophy- Patient will continue vaginal estrogen cream 1/2 g vaginally  weekly as well as blueberry sized amount of cream applied to urethra with finger twice weekly at bedtime. She denies any adverse reactions. No vaginal bleeding. Refill given.    4. Dysuria  - ? Vaginal burning vs GU burning  - CT did not identify an etiology for the burning - difficult to obtain a concise history of LLQ burning but is accentuated  by urination - will schedule cystoscopy - if cystoscopy is negative - patient is receptive to PT   Return for cystoscopy .  These notes generated with voice recognition software. I apologize for typographical errors.  Zara Council, [facility] [address], [place], [place]  [address] [phone]

## 2017-07-09 ENCOUNTER — Ambulatory Visit (INDEPENDENT_AMBULATORY_CARE_PROVIDER_SITE_OTHER): Payer: Medicare Other | Admitting: Family Medicine

## 2017-07-09 ENCOUNTER — Encounter: Payer: Self-pay | Admitting: Family Medicine

## 2017-07-09 ENCOUNTER — Encounter: Payer: Self-pay | Admitting: Urology

## 2017-07-09 ENCOUNTER — Ambulatory Visit (INDEPENDENT_AMBULATORY_CARE_PROVIDER_SITE_OTHER): Payer: Medicare Other | Admitting: Urology

## 2017-07-09 VITALS — BP 120/50 | HR 69 | Temp 97.9°F | Resp 16 | Ht 64.0 in | Wt 201.7 lb

## 2017-07-09 VITALS — BP 140/85 | HR 65 | Ht 64.0 in | Wt 201.7 lb

## 2017-07-09 DIAGNOSIS — E1169 Type 2 diabetes mellitus with other specified complication: Secondary | ICD-10-CM | POA: Diagnosis not present

## 2017-07-09 DIAGNOSIS — Z1231 Encounter for screening mammogram for malignant neoplasm of breast: Secondary | ICD-10-CM | POA: Diagnosis not present

## 2017-07-09 DIAGNOSIS — N952 Postmenopausal atrophic vaginitis: Secondary | ICD-10-CM | POA: Diagnosis not present

## 2017-07-09 DIAGNOSIS — E785 Hyperlipidemia, unspecified: Secondary | ICD-10-CM

## 2017-07-09 DIAGNOSIS — M1711 Unilateral primary osteoarthritis, right knee: Secondary | ICD-10-CM

## 2017-07-09 DIAGNOSIS — N200 Calculus of kidney: Secondary | ICD-10-CM

## 2017-07-09 DIAGNOSIS — R300 Dysuria: Secondary | ICD-10-CM

## 2017-07-09 DIAGNOSIS — D692 Other nonthrombocytopenic purpura: Secondary | ICD-10-CM | POA: Insufficient documentation

## 2017-07-09 DIAGNOSIS — I4892 Unspecified atrial flutter: Secondary | ICD-10-CM | POA: Diagnosis not present

## 2017-07-09 DIAGNOSIS — I10 Essential (primary) hypertension: Secondary | ICD-10-CM

## 2017-07-09 DIAGNOSIS — I701 Atherosclerosis of renal artery: Secondary | ICD-10-CM | POA: Diagnosis not present

## 2017-07-09 DIAGNOSIS — R197 Diarrhea, unspecified: Secondary | ICD-10-CM | POA: Diagnosis not present

## 2017-07-09 DIAGNOSIS — R1032 Left lower quadrant pain: Secondary | ICD-10-CM

## 2017-07-09 DIAGNOSIS — I700 Atherosclerosis of aorta: Secondary | ICD-10-CM | POA: Insufficient documentation

## 2017-07-09 DIAGNOSIS — Z794 Long term (current) use of insulin: Secondary | ICD-10-CM

## 2017-07-09 DIAGNOSIS — E113293 Type 2 diabetes mellitus with mild nonproliferative diabetic retinopathy without macular edema, bilateral: Secondary | ICD-10-CM

## 2017-07-09 NOTE — Progress Notes (Signed)
Name: Bonnie Rowland   MRN: [id number]    DOB: 1940-11-30   Date:07/09/2017       Progress Note  Subjective  Chief Complaint  Chief Complaint  Patient presents with  . Abdominal Pain  . Knee Pain  . Follow-up    HPI  DMII: diabetesl last hgbA1C was 7.0% back in August. However she has been eating more through the holidays and stressed because of son being diagnosed with colon cancer. She has  diabetic retinopathy, eye exam is up to date. Her hgbA1C has gone up from 6.3% back in 05/2015 to over 9 In June 2017 ,9.1 % 04/2016 and today is up to 10.3%, hgbA1C went down 8.3%, down to 7.0% - back August 2018. She states she is compliant with her medication,  but not compliant with her diet, and also checking fsbs 4 times daily. FSBS average past 4 weeks ( 86, but past two weeks in the upper 100 range ), occasional hypoglycemia in am's, lowest was 63   HTN: bp is at goal now. No chest pain or palpitation. On ARB, no side effects of medication  Hyperlipidemia: taking Crestor as prescribed and not side effects, last labs done at Endo was May and is at goal. She denies muscle aches. No chest pain or palpitation .   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 Marry Guan   History of malignant melanoma: up to date with Dermatologist - Dr Evorn Gong   Atherosclerosis of renal artery: bp is at goal, taking Crestor and aspirin as prescribed, no side effects. Also of abdominal aorta on recent CT abdomen done for stone search   Atrial Flutter: had one episode 01/2015 when she went in to have right knee replacement surgery, she was in a flutter and surgery was delayed. She had palpitation, SOB but no chest pain. She states was secondary to hypokalemia.   Diarrhea: she states she developed diarrhea after lunch today, Had meat loaf at the tea for two. She had 5 episodes of watery stools in the past few hours. She has been  drinking fluids, no fatigue, no fever or chills. No nausea or vomiting, she has mild continuous abdominal discomfort. No blood in stools  Left kidney stone: going to [facility] and will have procedure done soon to have it removed  Patient Active Problem List   Diagnosis Date Noted  . 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([place]) 07/09/2017  . Bilateral carotid bruits 08/24/2015  . 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 Atrial flutter ([place]) 02/22/2015  . Chronic pain 01/10/2015  . 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 Fatigue 01/10/2015  . History of melanoma excision 01/10/2015  . Adiposity 01/10/2015  . 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 Spinal stenosis, lumbar region, 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  . DDD (degenerative disc disease), lumbar 11/14/2014  . Greater trochanteric bursitis of both hips 11/14/2014  . Piriformis syndrome 11/14/2014  . 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 Hyperlipemia 11/07/2009  . Hypertension, benign 11/07/2009  . Atherosclerosis of renal artery ([place]) 11/07/2009  . Hyperlipidemia due to type 2 diabetes mellitus ([place]) 11/07/2009  . Perennial allergic rhinitis 11/27/2007  . Lumbosacral neuritis 01/17/2007    Past Surgical History:  Procedure Laterality Date  . APPENDECTOMY    . CARDIAC CATHETERIZATION    . cataract surgery    . COLONOSCOPY    .  COLONOSCOPY WITH PROPOFOL N/A 05/09/2016   Procedure: COLONOSCOPY WITH PROPOFOL;  Surgeon: Robert Bellow, MD;  Location: [facility]COPY;  Service: Endoscopy;  Laterality: N/A;  . DIAGNOSTIC MAMMOGRAM    . EYE SURGERY Bilateral    Cataract Extraction  . KNEE ARTHROPLASTY Right 03/30/2015   Procedure: COMPUTER ASSISTED TOTAL KNEE ARTHROPLASTY;  Surgeon: Dereck Leep, MD;  Location: [facility] ORS;  Service: Orthopedics;  Laterality: Right;  . KNEE ARTHROSCOPY Right   . KNEE CLOSED REDUCTION Right 05/23/2015    Procedure: CLOSED MANIPULATION KNEE;  Surgeon: Dereck Leep, MD;  Location: [facility] ORS;  Service: Orthopedics;  Laterality: Right;  . TONSILLECTOMY      Family History  Problem Relation Age of Onset  . Diabetes Mother   . Hypertension Mother   . Cancer Mother   . Hypertension Father   . Cancer Father        Prostate  . Breast cancer Maternal Aunt 32  . Colon cancer Son   . Kidney cancer Neg Hx   . Bladder Cancer Neg Hx     Social History   Socioeconomic History  . Marital status: Married    Spouse name: Not on file  . Number of children: Not on file  . Years of education: Not on file  . Highest education level: Not on file  Social Needs  . Financial resource strain: Not on file  . Food insecurity - worry: Not on file  . Food insecurity - inability: Not on file  . Transportation needs - medical: Not on file  . Transportation needs - non-medical: Not on file  Occupational History  . Not on file  Tobacco Use  . Smoking status: Never Smoker  . Smokeless tobacco: Never Used  Substance and Sexual Activity  . Alcohol use: Yes    Alcohol/week: 0.0 oz    Comment: occ  . Drug use: No  . Sexual activity: Yes    Partners: Male  Other Topics Concern  . Not on file  Social History Narrative  . Not on file     Current Outpatient Medications:  .  ALFALFA PO, Take 3 tablets by mouth every morning., Disp: , Rfl:  .  aspirin 81 MG EC tablet, Take 81 mg by mouth at bedtime. , Disp: , Rfl:  .  B Complex-C (B-COMPLEX WITH VITAMIN C) tablet, Take 2 tablets by mouth at bedtime. , Disp: , Rfl:  .  BLACK ELDERBERRY,BERRY-FLOWER, PO, Take by mouth., Disp: , Rfl:  .  calcium carbonate (OS-CAL) 600 MG TABS tablet, Take 600 mg by mouth daily., Disp: , Rfl:  .  cholecalciferol (VITAMIN D) 1000 UNITS tablet, Take 4,000 Units by mouth daily. , Disp: , Rfl:  .  clobetasol ointment (TEMOVATE) 5.18 %, Apply 1 application topically 2 (two) times daily as needed (bug bites). Reported on  06/30/2015, Disp: , Rfl:  .  Coenzyme Q10 (COQ10) 200 MG CAPS, Take 1 tablet by mouth daily. 400 mg every am., Disp: , Rfl:  .  Continuous Blood Gluc Sensor ([place]) MISC, by Does not apply route., Disp: , Rfl:  .  estradiol (ESTRACE VAGINAL) 0.1 MG/GM vaginal cream, 1/2 gm once weekly using applicator, apply blueberry sized amount of cream using tip of finger to urethra twice weekly, Disp: 50 g, Rfl: 4 .  Exenatide ER 2 MG SRER, Inject 2 mg into the skin once a week., Disp: 12 each, Rfl: 0 .  Eyelid Cleansers (AVENOVA) 0.01 % SOLN, See  admin instructions., Disp: , Rfl: 3 .  fish oil-omega-3 fatty acids 1000 MG capsule, Take 2 g by mouth daily.  , Disp: , Rfl:  .  fluticasone (FLONASE) 50 MCG/ACT nasal spray, PLACE 2 SPRAYS INTO BOTH NOSTRILS DAILY., Disp: 8.5 g, Rfl: 2 .  GARLIC 9211 PO, Take 1 tablet by mouth daily. 1 gram, Disp: , Rfl:  .  Glucosamine 500 MG TABS, Take 1 tablet by mouth daily. , Disp: , Rfl:  .  glucose blood (ONE TOUCH ULTRA TEST) test strip, 1 each by Other route 3 (three) times daily. Use as instructed, Disp: 300 each, Rfl: 1 .  Insulin Glargine (LANTUS SOLOSTAR) 100 UNIT/ML Solostar Pen, Inject 24 Units into the skin every morning., Disp: 15 pen, Rfl: 2 .  insulin lispro (HUMALOG) 100 UNIT/ML KiwkPen, Inject into the skin., Disp: , Rfl:  .  Insulin Pen Needle 32G X 4 MM MISC, 1 pen by Does not apply route 2 (two) times daily. Use as directed 2 times daily, Disp: 200 each, Rfl: 2 .  Lancets (ONETOUCH ULTRASOFT) lancets, Use as instructed, Disp: 100 each, Rfl: 12 .  loratadine (CLARITIN) 10 MG tablet, Take 1 tablet (10 mg total) by mouth daily., Disp: 30 tablet, Rfl: 11 .  losartan-hydrochlorothiazide (HYZAAR) 100-25 MG tablet, Take 1 tablet by mouth daily., Disp: 90 tablet, Rfl: 1 .  MAGNESIUM-POTASSIUM PO, Take 1 tablet by mouth 2 (two) times daily. Reported on 06/30/2015, Disp: , Rfl:  .  metoprolol succinate (TOPROL-XL) 25 MG 24 hr tablet, Take 1  tablet (25 mg total) by mouth daily., Disp: 90 tablet, Rfl: 3 .  Multiple Vitamins-Minerals (PRESERVISION AREDS 2) CAPS, Take 1 capsule by mouth 2 (two) times daily., Disp: , Rfl:  .  nystatin cream (MYCOSTATIN), Apply 1 application topically 2 (two) times daily., Disp: 30 g, Rfl: 2 .  Probiotic Product (PROBIOTIC PO), Take 1 Dose by mouth at bedtime., Disp: , Rfl:  .  Propylene Glycol (SYSTANE BALANCE OP), Apply 1 drop to eye as needed., Disp: , Rfl:  .  raloxifene (EVISTA) 60 MG tablet, Take 1 tablet (60 mg total) by mouth every morning., Disp: 90 tablet, Rfl: 3 .  rosuvastatin (CRESTOR) 10 MG tablet, Take 1 tablet (10 mg total) by mouth daily., Disp: 90 tablet, Rfl: 1 .  sodium fluoride (DENTAGEL) 1.1 % GEL dental gel, , Disp: , Rfl:  .  sulfacetamide (BLEPH-10) 10 % ophthalmic solution, Place 1 drop into both eyes 3 (three) times daily as needed., Disp: , Rfl:  .  triamcinolone cream (KENALOG) 0.1 %, Apply 1 application topically 2 (two) times daily., Disp: 45 g, Rfl: 0 .  vitamin E (VITAMIN E) 400 UNIT capsule, Take 400 Units by mouth daily. Every am, Disp: , Rfl:   Allergies  Allergen Reactions  . Cyclobenzaprine Hypertension  . Cheese Other (See Comments)    bloating  . Ciprofloxacin Hcl Other (See Comments)    Muscle pain  . Coconut Oil      ROS  Constitutional: Negative for fever or weight change.  Respiratory: Negative for cough and shortness of breath.   Cardiovascular: Negative for chest pain or palpitations.  Gastrointestinal: positive for mild  abdominal pain and  bowel changes.  Musculoskeletal: Positive  for gait problem and right knee  joint swelling.  Skin: Negative for rash.  Neurological: Negative for dizziness or headache.  No other specific complaints in a complete review of systems (except as listed in HPI above).  Objective  Vitals:  07/09/17 1448  BP: (!) 120/50  Pulse: 69  Resp: 16  Temp: 97.9 F (36.6 C)  TempSrc: Oral  SpO2: 97%  Weight: 201  lb 11.2 oz (91.5 kg)  Height: 5\' 4"  (1.626 m)    Body mass index is 34.62 kg/m.  Physical Exam  Constitutional: Patient appears well-developed and well-nourished. Obese  No distress.  HEENT: head atraumatic, normocephalic, pupils equal and reactive to light, neck supple, throat within normal limits Cardiovascular: Normal rate, regular rhythm and normal heart sounds.  No murmur heard. No BLE edema. Pulmonary/Chest: Effort normal and breath sounds normal. No respiratory distress. Abdominal: Soft.  There is no tenderness. Psychiatric: Patient has a normal mood and affect. behavior is normal. Judgment and thought content normal. Knee pain: still has some effusion and decrease rom , needs to follow up with Dr. Marry Guan   Recent Results (from the past 2160 hour(s))  Urinalysis, Complete     Status: Abnormal   Collection Time: 06/05/17 11:27 AM  Result Value Ref Range   Specific Gravity, UA 1.010 1.005 - 1.030   pH, UA 7.0 5.0 - 7.5   Color, UA Yellow Yellow   Appearance Ur Clear Clear   Leukocytes, UA Trace (A) Negative   Protein, UA Negative Negative/Trace   Glucose, UA Negative Negative   Ketones, UA Negative Negative   RBC, UA Negative Negative   Bilirubin, UA Negative Negative   Urobilinogen, Ur 0.2 0.2 - 1.0 mg/dL   Nitrite, UA Negative Negative   Microscopic Examination See below:   Microscopic Examination     Status: Abnormal   Collection Time: 06/05/17 11:27 AM  Result Value Ref Range   WBC, UA 0-5 0 - 5 /hpf   RBC, UA None seen 0 - 2 /hpf   Epithelial Cells (non renal) 0-10 0 - 10 /hpf   Bacteria, UA None seen None seen/Few   Yeast, UA Present (A) None seen  CULTURE, URINE COMPREHENSIVE     Status: None   Collection Time: 06/05/17 12:10 PM  Result Value Ref Range   Urine Culture, Comprehensive Final report    Organism ID, Bacteria Comment     Comment: Mixed urogenital flora 5,000  Colonies/mL   Urinalysis, Complete     Status: Abnormal   Collection Time: 06/20/17   2:19 PM  Result Value Ref Range   Specific Gravity, UA 1.010 1.005 - 1.030   pH, UA 6.5 5.0 - 7.5   Color, UA Yellow Yellow   Appearance Ur Clear Clear   Leukocytes, UA Negative Negative   Protein, UA Negative Negative/Trace   Glucose, UA 3+ (A) Negative   Ketones, UA Negative Negative   RBC, UA Negative Negative   Bilirubin, UA Negative Negative   Urobilinogen, Ur 0.2 0.2 - 1.0 mg/dL   Nitrite, UA Negative Negative      PHQ2/9: Depression screen [facility] 2/9 04/08/2017 09/24/2016 08/21/2016 06/18/2016 04/23/2016  Decreased Interest 0 0 0 0 0  Down, Depressed, Hopeless 0 0 0 0 0  PHQ - 2 Score 0 0 0 0 0     Fall Risk: Fall Risk  07/09/2017 04/08/2017 12/17/2016 10/22/2016 09/24/2016  Falls in the past year? No No No No No  Comment - - - - -  Risk for fall due to : - Impaired mobility - - -  Risk for fall due to: Comment - pain in R knee - - -     Functional Status Survey: Is the patient deaf or have difficulty hearing?: No  Does the patient have difficulty seeing, even when wearing glasses/contacts?: No Does the patient have difficulty concentrating, remembering, or making decisions?: No Does the patient have difficulty walking or climbing stairs?: No Does the patient have difficulty dressing or bathing?: No Does the patient have difficulty doing errands alone such as visiting a doctor's office or shopping?: No    Assessment & Plan  1. Hypertension, benign  DBP is towards low end of normal, but she denies dizziness  2. 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(HCC)  stable  3. Controlled type 2 diabetes mellitus with both eyes affected by mild nonproliferative retinopathy without macular edema, with long-term current use of insulin (HCC)  Continue follow up with Dr. Honor Junes  4. Atrial flutter, paroxysmal [facility])  Sees cardiologist , secondary to hypokalemia, not on anti-coagulation  5. Atherosclerosis of renal artery (HCC)  On statin therapy, bp under good control, taking aspirin daily  6.  Primary osteoarthritis of right knee  S/p right knee replacement and needs to follow up with Dr. Marry Guan since still has pain, after over one year post-surgery   7. Hyperlipidemia due to type 2 diabetes mellitus ([place])  Continue statin therapy and good diabetes control   8. Acute diarrhea  Discussed red flags, fever, chills, vomiting, blood in stools and signs of dehydration, to go to [facility]. Started today   9. Atherosclerosis of abdominal aorta ([place])   10. Kidney stone on left side  Seeing [facility]

## 2017-07-11 DIAGNOSIS — M9903 Segmental and somatic dysfunction of lumbar region: Secondary | ICD-10-CM | POA: Diagnosis not present

## 2017-07-11 DIAGNOSIS — M5416 Radiculopathy, lumbar region: Secondary | ICD-10-CM | POA: Diagnosis not present

## 2017-07-11 DIAGNOSIS — M9901 Segmental and somatic dysfunction of cervical region: Secondary | ICD-10-CM | POA: Diagnosis not present

## 2017-07-11 DIAGNOSIS — M5033 Other cervical disc degeneration, cervicothoracic region: Secondary | ICD-10-CM | POA: Diagnosis not present

## 2017-07-16 DIAGNOSIS — H353132 Nonexudative age-related macular degeneration, bilateral, intermediate dry stage: Secondary | ICD-10-CM | POA: Diagnosis not present

## 2017-07-16 DIAGNOSIS — H43813 Vitreous degeneration, bilateral: Secondary | ICD-10-CM | POA: Diagnosis not present

## 2017-07-17 ENCOUNTER — Encounter: Payer: Self-pay | Admitting: Family Medicine

## 2017-07-18 DIAGNOSIS — M5416 Radiculopathy, lumbar region: Secondary | ICD-10-CM | POA: Diagnosis not present

## 2017-07-18 DIAGNOSIS — M9903 Segmental and somatic dysfunction of lumbar region: Secondary | ICD-10-CM | POA: Diagnosis not present

## 2017-07-18 DIAGNOSIS — M5033 Other cervical disc degeneration, cervicothoracic region: Secondary | ICD-10-CM | POA: Diagnosis not present

## 2017-07-18 DIAGNOSIS — M9901 Segmental and somatic dysfunction of cervical region: Secondary | ICD-10-CM | POA: Diagnosis not present

## 2017-07-25 DIAGNOSIS — Z794 Long term (current) use of insulin: Secondary | ICD-10-CM | POA: Diagnosis not present

## 2017-07-25 DIAGNOSIS — M5416 Radiculopathy, lumbar region: Secondary | ICD-10-CM | POA: Diagnosis not present

## 2017-07-25 DIAGNOSIS — E1165 Type 2 diabetes mellitus with hyperglycemia: Secondary | ICD-10-CM | POA: Diagnosis not present

## 2017-07-25 DIAGNOSIS — M9903 Segmental and somatic dysfunction of lumbar region: Secondary | ICD-10-CM | POA: Diagnosis not present

## 2017-07-25 DIAGNOSIS — I10 Essential (primary) hypertension: Secondary | ICD-10-CM | POA: Diagnosis not present

## 2017-07-25 DIAGNOSIS — E1159 Type 2 diabetes mellitus with other circulatory complications: Secondary | ICD-10-CM | POA: Diagnosis not present

## 2017-07-25 DIAGNOSIS — E1169 Type 2 diabetes mellitus with other specified complication: Secondary | ICD-10-CM | POA: Diagnosis not present

## 2017-07-25 DIAGNOSIS — M9901 Segmental and somatic dysfunction of cervical region: Secondary | ICD-10-CM | POA: Diagnosis not present

## 2017-07-25 DIAGNOSIS — M5033 Other cervical disc degeneration, cervicothoracic region: Secondary | ICD-10-CM | POA: Diagnosis not present

## 2017-07-25 DIAGNOSIS — E785 Hyperlipidemia, unspecified: Secondary | ICD-10-CM | POA: Diagnosis not present

## 2017-07-26 ENCOUNTER — Other Ambulatory Visit: Payer: Self-pay

## 2017-07-26 DIAGNOSIS — IMO0002 Reserved for concepts with insufficient information to code with codable children: Secondary | ICD-10-CM

## 2017-07-26 DIAGNOSIS — Z794 Long term (current) use of insulin: Principal | ICD-10-CM

## 2017-07-26 DIAGNOSIS — E113299 Type 2 diabetes mellitus with mild nonproliferative diabetic retinopathy without macular edema, unspecified eye: Secondary | ICD-10-CM

## 2017-07-26 DIAGNOSIS — E1165 Type 2 diabetes mellitus with hyperglycemia: Principal | ICD-10-CM

## 2017-07-26 MED ORDER — INSULIN PEN NEEDLE 32G X 4 MM MISC: 1.0000 "pen " | Freq: Two times a day (BID) | 2 refills | Status: DC

## 2017-07-26 NOTE — Telephone Encounter (Signed)
Refill request for diabetic medication:   BD ultra fine nano pen needle 4 mmx32G  Last office visit pertaining to diabetes: 07/09/2017    Lab Results  Component Value Date   HGBA1C 7.0 02/18/2017   Follow-up on file. 08/22/2017

## 2017-07-30 ENCOUNTER — Ambulatory Visit (INDEPENDENT_AMBULATORY_CARE_PROVIDER_SITE_OTHER): Payer: Medicare Other | Admitting: Urology

## 2017-07-30 ENCOUNTER — Encounter: Payer: Self-pay | Admitting: Urology

## 2017-07-30 DIAGNOSIS — N952 Postmenopausal atrophic vaginitis: Secondary | ICD-10-CM

## 2017-07-30 DIAGNOSIS — R300 Dysuria: Secondary | ICD-10-CM | POA: Diagnosis not present

## 2017-07-30 DIAGNOSIS — R1032 Left lower quadrant pain: Secondary | ICD-10-CM

## 2017-07-30 MED ORDER — DOXYCYCLINE HYCLATE 100 MG PO TABS
100.0000 mg | ORAL_TABLET | Freq: Once | ORAL | Status: AC
  Administered 2017-07-30: 100 mg via ORAL

## 2017-07-30 MED ORDER — LIDOCAINE HCL 2 % EX GEL
1.0000 | Freq: Once | CUTANEOUS | Status: AC
Start: 2017-07-30 — End: 2017-07-30
  Administered 2017-07-30: 1 via URETHRAL

## 2017-07-30 NOTE — Progress Notes (Signed)
   07/30/17  CC:  Chief Complaint  Patient presents with  . Cysto    HPI: 77 year old female with a history of vaginal atrophy and left lower quadrant pain of unknown etiology.  She presents today for cystoscopy to evaluate her bladder as a possible cause.  Blood pressure (!) 154/64, pulse 74, height 5\' 4"  (1.626 m), weight 200 lb (90.7 kg). NED. A&Ox3.   No respiratory distress   Abd soft, NT, ND Normal external genitalia with patent urethral meatus  Cystoscopy Procedure Note  Patient identification was confirmed, informed consent was obtained, and patient was prepped using Betadine solution.  Lidocaine jelly was administered per urethral meatus.    Preoperative abx where received prior to procedure.    Procedure: - Flexible cystoscope introduced, without any difficulty.   - Thorough search of the bladder revealed:    normal urethral meatus    normal urothelium    no stones    no ulcers     no tumors    no urethral polyps    no trabeculation  - Ureteral orifices were normal in position and appearance.  Post-Procedure: - Patient tolerated the procedure well  Assessment/ Plan:  1. Vaginal atrophy Continue topical estrogen cream as prescribed by Zara Council - Urinalysis, Complete - doxycycline (VIBRA-TABS) tablet 100 mg - lidocaine (XYLOCAINE) 2 % jelly 1 application  2. Left lower quadrant pain Bladder unremarkable today on cystoscopy No clear underlying GU pathology to explain her left lower quadrant pain   Hollice Espy, MD

## 2017-07-31 LAB — URINALYSIS, COMPLETE
BILIRUBIN UA: NEGATIVE
GLUCOSE, UA: NEGATIVE
Ketones, UA: NEGATIVE
Nitrite, UA: NEGATIVE
PROTEIN UA: NEGATIVE
RBC UA: NEGATIVE
Specific Gravity, UA: 1.015 (ref 1.005–1.030)
UUROB: 0.2 mg/dL (ref 0.2–1.0)
pH, UA: 7.5 (ref 5.0–7.5)

## 2017-07-31 LAB — MICROSCOPIC EXAMINATION

## 2017-08-01 DIAGNOSIS — M5033 Other cervical disc degeneration, cervicothoracic region: Secondary | ICD-10-CM | POA: Diagnosis not present

## 2017-08-01 DIAGNOSIS — M9903 Segmental and somatic dysfunction of lumbar region: Secondary | ICD-10-CM | POA: Diagnosis not present

## 2017-08-01 DIAGNOSIS — M5416 Radiculopathy, lumbar region: Secondary | ICD-10-CM | POA: Diagnosis not present

## 2017-08-01 DIAGNOSIS — M9901 Segmental and somatic dysfunction of cervical region: Secondary | ICD-10-CM | POA: Diagnosis not present

## 2017-08-06 ENCOUNTER — Telehealth: Payer: Self-pay | Admitting: Family Medicine

## 2017-08-06 ENCOUNTER — Other Ambulatory Visit: Payer: Self-pay | Admitting: Family Medicine

## 2017-08-06 DIAGNOSIS — Z794 Long term (current) use of insulin: Principal | ICD-10-CM

## 2017-08-06 DIAGNOSIS — E113299 Type 2 diabetes mellitus with mild nonproliferative diabetic retinopathy without macular edema, unspecified eye: Secondary | ICD-10-CM

## 2017-08-06 DIAGNOSIS — E1165 Type 2 diabetes mellitus with hyperglycemia: Principal | ICD-10-CM

## 2017-08-06 DIAGNOSIS — IMO0002 Reserved for concepts with insufficient information to code with codable children: Secondary | ICD-10-CM

## 2017-08-06 NOTE — Telephone Encounter (Signed)
Yes, it can be changed since she is on insulin

## 2017-08-06 NOTE — Telephone Encounter (Signed)
Copied from [place] [id number]. Topic: Quick Communication - See Telephone Encounter >> Aug 06, 2017 12:45 PM Bea Graff, NT wrote: CRM for notification. See Telephone encounter for: [facility] calling about the rx for the pts  Insulin Pen Needle, states pt stated she uses this 4 times daily instead of how the script is wrote which is 2 times daily. Can rx be changed? 08/06/17.

## 2017-08-07 NOTE — Telephone Encounter (Signed)
Pharmacy was notified and states it already was corrected to 4 x a day on 08/06/17 per [facility].

## 2017-08-08 DIAGNOSIS — M9901 Segmental and somatic dysfunction of cervical region: Secondary | ICD-10-CM | POA: Diagnosis not present

## 2017-08-08 DIAGNOSIS — M5416 Radiculopathy, lumbar region: Secondary | ICD-10-CM | POA: Diagnosis not present

## 2017-08-08 DIAGNOSIS — M9903 Segmental and somatic dysfunction of lumbar region: Secondary | ICD-10-CM | POA: Diagnosis not present

## 2017-08-08 DIAGNOSIS — M5033 Other cervical disc degeneration, cervicothoracic region: Secondary | ICD-10-CM | POA: Diagnosis not present

## 2017-08-13 ENCOUNTER — Other Ambulatory Visit: Payer: Self-pay | Admitting: Family Medicine

## 2017-08-13 ENCOUNTER — Ambulatory Visit
Admission: RE | Admit: 2017-08-13 | Discharge: 2017-08-13 | Disposition: A | Discharge status: Home / Self Care | Payer: Medicare Other | Source: Ambulatory Visit | Attending: Family Medicine | Admitting: Family Medicine

## 2017-08-13 DIAGNOSIS — E2839 Other primary ovarian failure: Secondary | ICD-10-CM

## 2017-08-13 DIAGNOSIS — Z13820 Encounter for screening for osteoporosis: Secondary | ICD-10-CM | POA: Diagnosis not present

## 2017-08-13 DIAGNOSIS — Z780 Asymptomatic menopausal state: Secondary | ICD-10-CM | POA: Diagnosis not present

## 2017-08-13 DIAGNOSIS — Z1231 Encounter for screening mammogram for malignant neoplasm of breast: Secondary | ICD-10-CM | POA: Diagnosis not present

## 2017-08-13 DIAGNOSIS — M810 Age-related osteoporosis without current pathological fracture: Secondary | ICD-10-CM | POA: Diagnosis not present

## 2017-08-15 DIAGNOSIS — M9903 Segmental and somatic dysfunction of lumbar region: Secondary | ICD-10-CM | POA: Diagnosis not present

## 2017-08-15 DIAGNOSIS — M5416 Radiculopathy, lumbar region: Secondary | ICD-10-CM | POA: Diagnosis not present

## 2017-08-15 DIAGNOSIS — M5033 Other cervical disc degeneration, cervicothoracic region: Secondary | ICD-10-CM | POA: Diagnosis not present

## 2017-08-15 DIAGNOSIS — M9901 Segmental and somatic dysfunction of cervical region: Secondary | ICD-10-CM | POA: Diagnosis not present

## 2017-08-22 ENCOUNTER — Encounter: Payer: Self-pay | Admitting: Family Medicine

## 2017-08-22 ENCOUNTER — Ambulatory Visit (INDEPENDENT_AMBULATORY_CARE_PROVIDER_SITE_OTHER): Payer: Medicare Other | Admitting: Family Medicine

## 2017-08-22 VITALS — BP 140/60 | HR 67 | Temp 98.3°F | Resp 16 | Ht 63.0 in | Wt 200.4 lb

## 2017-08-22 DIAGNOSIS — I701 Atherosclerosis of renal artery: Secondary | ICD-10-CM

## 2017-08-22 DIAGNOSIS — Z124 Encounter for screening for malignant neoplasm of cervix: Secondary | ICD-10-CM | POA: Diagnosis not present

## 2017-08-22 DIAGNOSIS — M9901 Segmental and somatic dysfunction of cervical region: Secondary | ICD-10-CM | POA: Diagnosis not present

## 2017-08-22 DIAGNOSIS — M5416 Radiculopathy, lumbar region: Secondary | ICD-10-CM | POA: Diagnosis not present

## 2017-08-22 DIAGNOSIS — Z01419 Encounter for gynecological examination (general) (routine) without abnormal findings: Secondary | ICD-10-CM

## 2017-08-22 DIAGNOSIS — Z79899 Other long term (current) drug therapy: Secondary | ICD-10-CM

## 2017-08-22 DIAGNOSIS — E785 Hyperlipidemia, unspecified: Secondary | ICD-10-CM | POA: Diagnosis not present

## 2017-08-22 DIAGNOSIS — M5033 Other cervical disc degeneration, cervicothoracic region: Secondary | ICD-10-CM | POA: Diagnosis not present

## 2017-08-22 DIAGNOSIS — I10 Essential (primary) hypertension: Secondary | ICD-10-CM | POA: Diagnosis not present

## 2017-08-22 DIAGNOSIS — R102 Pelvic and perineal pain: Secondary | ICD-10-CM | POA: Diagnosis not present

## 2017-08-22 DIAGNOSIS — M810 Age-related osteoporosis without current pathological fracture: Secondary | ICD-10-CM | POA: Diagnosis not present

## 2017-08-22 DIAGNOSIS — M9903 Segmental and somatic dysfunction of lumbar region: Secondary | ICD-10-CM | POA: Diagnosis not present

## 2017-08-22 NOTE — Progress Notes (Signed)
Name: Bonnie Rowland   MRN: [id number]    DOB: 08/01/1940   Date:08/22/2017       Progress Note  Subjective  Chief Complaint  Chief Complaint  Patient presents with  . Annual Exam  . Osteoporosis    HPI   Patient presents for annual CPE and follow up  Osteoporosis: she has been on Evista for many years, but bone density has dropped on right hip, -03%, discussed options, she is willing to try Forteo and we will give her information, check labs first and start if negative studies.   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 , but pain is worse on right side, she has replacement, she has a handicap sticke  Vaginal pain: she is seeing Urologist for evaluation of supra pubic pain and studies have been negative, she is concerned about being secondary to vaginal or cervical problems we will check pelvic exam today  USPSTF grade A and B recommendations    Depression:  Depression screen [facility] 2/9 08/22/2017 04/08/2017 09/24/2016 08/21/2016 06/18/2016  Decreased Interest 0 0 0 0 0  Down, Depressed, Hopeless 0 0 0 0 0  PHQ - 2 Score 0 0 0 0 0   Hypertension: BP Readings from Last 3 Encounters:  08/22/17 140/60  07/30/17 (!) 154/64  07/09/17 (!) 120/50   Obesity: Wt Readings from Last 3 Encounters:  08/22/17 200 lb 6.4 oz (90.9 kg)  07/30/17 200 lb (90.7 kg)  07/09/17 201 lb 11.2 oz (91.5 kg)   BMI Readings from Last 3 Encounters:  08/22/17 35.50 kg/m  07/30/17 34.33 kg/m  07/09/17 34.62 kg/m     HIV, hep B, hep C: no need  STD testing and prevention (chl/gon/syphilis): no need  Intimate partner violence:negative Sexual History/Pain during Intercourse: still sexually active,but no pain associated with it Menstrual History/LMP/Abnormal  Bleeding: post-menopausal, seeing Urologist for supra pubic and vaginal pain, so far negative studies Incontinence Symptoms: she has incontinence, wears pads  Advanced Care Planning: A voluntary discussion about advance care planning including the explanation and discussion of advance directives.  Discussed health care proxy and Living will, and the patient was able to identify a health care proxy as husband.  Patient does have a living will at present time.  Breast cancer:  HM Mammogram  Date Value Ref Range Status  12/17/2013 normal  Final     Cervical cancer screening: today   Osteoporosis: 08/2017  Lipids:  Lab Results  Component Value Date   CHOL 119 11/16/2016   CHOL 110 01/12/2016   CHOL 126 05/17/2015   Lab Results  Component Value Date   HDL 54 11/16/2016   HDL 59 01/12/2016   HDL 71 05/17/2015   Lab Results  Component Value Date   LDLCALC 52 11/16/2016   LDLCALC 39 01/12/2016   LDLCALC 44 05/17/2015   Lab Results  Component Value Date   TRIG 67 11/16/2016   TRIG 58 01/12/2016   TRIG 57 05/17/2015   Lab Results  Component Value Date   CHOLHDL 1.9 01/12/2016   CHOLHDL 1.8 05/17/2015   No results found for: LDLDIRECT  Glucose:  Glucose  Date Value Ref Range Status  01/12/2016 88 65 - 99 mg/dL Final  05/17/2015 78 65 - 99  mg/dL Final   Glucose, Bld  Date Value Ref Range Status  03/31/2015 313 (H) 65 - 99 mg/dL Final  03/16/2015 178 (H) 65 - 99 mg/dL Final  02/21/2015 157 (H) 65 - 99 mg/dL Final   Glucose-Capillary  Date Value Ref Range Status  05/23/2015 150 (H) 65 - 99 mg/dL Final  05/23/2015 171 (H) 65 - 99 mg/dL Final  04/20/2015 77 65 - 99 mg/dL Final    Skin cancer: seeing Dermatologist every 4-6 months  Colorectal cancer:   2017 Lung cancer:  Low Dose CT Chest recommended if Age 25-80 years, 30 pack-year currently smoking OR have quit w/in 15years. Patient does not qualify.   Aspirin: yes ECG:08/2016   Patient Active Problem List    Diagnosis Date Noted  . 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([place]) 07/09/2017  . Atherosclerosis of abdominal aorta ([place]) 07/09/2017  . Bilateral carotid bruits 08/24/2015  . 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 Atrial flutter ([place]) 02/22/2015  . Chronic pain 01/10/2015  . 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 Fatigue 01/10/2015  . History of melanoma excision 01/10/2015  . Adiposity 01/10/2015  . 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 Spinal stenosis, lumbar region, 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  . DDD (degenerative disc disease), lumbar 11/14/2014  . Greater trochanteric bursitis of both hips 11/14/2014  . Piriformis syndrome 11/14/2014  . 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 Hyperlipemia 11/07/2009  . Hypertension, benign 11/07/2009  . Atherosclerosis of renal artery ([place]) 11/07/2009  . Hyperlipidemia due to type 2 diabetes mellitus ([place]) 11/07/2009  . Perennial allergic rhinitis 11/27/2007  . Lumbosacral neuritis 01/17/2007    Past Surgical History:  Procedure Laterality Date  . APPENDECTOMY    . CARDIAC CATHETERIZATION    . cataract surgery    . COLONOSCOPY    . COLONOSCOPY WITH PROPOFOL N/A 05/09/2016   Procedure: COLONOSCOPY WITH PROPOFOL;  Surgeon: Robert Bellow, MD;  Location: [facility] ENDOSCOPY;  Service: Endoscopy;  Laterality: N/A;  . DIAGNOSTIC MAMMOGRAM    . EYE SURGERY Bilateral    Cataract Extraction  . KNEE ARTHROPLASTY Right 03/30/2015   Procedure: COMPUTER ASSISTED TOTAL KNEE ARTHROPLASTY;  Surgeon: Dereck Leep, MD;  Location: [facility] ORS;  Service: Orthopedics;  Laterality: Right;  . KNEE ARTHROSCOPY Right   . KNEE CLOSED REDUCTION Right 05/23/2015   Procedure: CLOSED MANIPULATION KNEE;  Surgeon: Dereck Leep, MD;  Location: [facility] ORS;  Service: Orthopedics;  Laterality: Right;  . TONSILLECTOMY      Family History  Problem Relation Age of Onset  . Diabetes  Mother   . Hypertension Mother   . Cancer Mother   . Hypertension Father   . Cancer Father        Prostate  . Breast cancer Maternal Aunt 45  . Colon cancer Son   . Kidney cancer Neg Hx   . Bladder Cancer Neg Hx     Social History   Socioeconomic History  . Marital status: Married    Spouse name: Not on file  . Number of children: 1  . Years of education: Not on file  . Highest education level: Master's degree (e.g., MA, MS, MEng, MEd, MSW, MBA)  Social Needs  . Financial resource strain: Not hard at all  . Food insecurity - worry: Never true  . Food insecurity - inability: Never true  . Transportation needs - medical: No  . Transportation needs - non-medical: No  Occupational History  . Not on file  Tobacco Use  . Smoking status: Never Smoker  . Smokeless tobacco: Never Used  Substance and Sexual Activity  . Alcohol use: Yes    Alcohol/week: 0.0 oz    Comment: occ  . Drug use: No  . Sexual activity: Yes    Partners: Male  Other Topics Concern  . Not on file  Social History Narrative   She is married , only has one son and he was diagnosed with colon cancer at age 65.      Current Outpatient Medications:  .  ALFALFA PO, Take 3 tablets by mouth every morning., Disp: , Rfl:  .  aspirin 81 MG EC tablet, Take 81 mg by mouth at bedtime. , Disp: , Rfl:  .  B Complex-C (B-COMPLEX WITH VITAMIN C) tablet, Take 2 tablets by mouth at bedtime. , Disp: , Rfl:  .  BLACK ELDERBERRY,BERRY-FLOWER, PO, Take by mouth., Disp: , Rfl:  .  calcium carbonate (OS-CAL) 600 MG TABS tablet, Take 600 mg by mouth daily., Disp: , Rfl:  .  cholecalciferol (VITAMIN D) 1000 UNITS tablet, Take 4,000 Units by mouth daily. , Disp: , Rfl:  .  clobetasol ointment (TEMOVATE) 8.10 %, Apply 1 application topically 2 (two) times daily as needed (bug bites). Reported on 06/30/2015, Disp: , Rfl:  .  Coenzyme Q10 (COQ10) 200 MG CAPS, Take 1 tablet by mouth daily. 400 mg every am., Disp: , Rfl:  .  Continuous  Blood Gluc Sensor ([place]) MISC, by Does not apply route., Disp: , Rfl:  .  estradiol (ESTRACE VAGINAL) 0.1 MG/GM vaginal cream, 1/2 gm once weekly using applicator, apply blueberry sized amount of cream using tip of finger to urethra twice weekly, Disp: 50 g, Rfl: 4 .  Exenatide ER 2 MG SRER, Inject 2 mg into the skin once a week., Disp: 12 each, Rfl: 0 .  Eyelid Cleansers (AVENOVA) 0.01 % SOLN, See admin instructions., Disp: , Rfl: 3 .  fish oil-omega-3 fatty acids 1000 MG capsule, Take 2 g by mouth daily.  , Disp: , Rfl:  .  fluticasone (FLONASE) 50 MCG/ACT nasal spray, PLACE 2 SPRAYS INTO BOTH NOSTRILS DAILY., Disp: 8.5 g, Rfl: 2 .  GARLIC 1751 PO, Take 1 tablet by mouth daily. 1 gram, Disp: , Rfl:  .  Glucosamine 500 MG TABS, Take 1 tablet by mouth daily. , Disp: , Rfl:  .  glucose blood (ONE TOUCH ULTRA TEST) test strip, 1 each by Other route 3 (three) times daily. Use as instructed, Disp: 300 each, Rfl: 1 .  Insulin Glargine (LANTUS SOLOSTAR) 100 UNIT/ML Solostar Pen, Inject 24 Units into the skin every morning., Disp: 15 pen, Rfl: 2 .  insulin lispro (HUMALOG) 100 UNIT/ML KiwkPen, Inject into the skin., Disp: , Rfl:  .  Insulin Pen Needle 32G X 4 MM MISC, 1 pen by Does not apply route 2 (two) times daily. Use as directed 2 times daily, Disp: 200 each, Rfl: 2 .  Lancets (ONETOUCH ULTRASOFT) lancets, Use as instructed, Disp: 100 each, Rfl: 12 .  loratadine (CLARITIN) 10 MG tablet, Take 1 tablet (10 mg total) by mouth daily., Disp: 30 tablet, Rfl: 11 .  losartan-hydrochlorothiazide (HYZAAR) 100-25 MG tablet, Take 1 tablet by mouth daily., Disp: 90 tablet, Rfl: 1 .  MAGNESIUM-POTASSIUM PO, Take 1 tablet by mouth 2 (two) times daily. Reported on 06/30/2015, Disp: , Rfl:  .  metoprolol succinate (TOPROL-XL) 25 MG 24 hr tablet,  Take 1 tablet (25 mg total) by mouth daily., Disp: 90 tablet, Rfl: 3 .  Multiple Vitamins-Minerals (PRESERVISION AREDS 2) CAPS, Take 1 capsule by  mouth 2 (two) times daily., Disp: , Rfl:  .  nystatin cream (MYCOSTATIN), Apply 1 application topically 2 (two) times daily., Disp: 30 g, Rfl: 2 .  Probiotic Product (PROBIOTIC PO), Take 1 Dose by mouth at bedtime., Disp: , Rfl:  .  Propylene Glycol (SYSTANE BALANCE OP), Apply 1 drop to eye as needed., Disp: , Rfl:  .  raloxifene (EVISTA) 60 MG tablet, Take 1 tablet (60 mg total) by mouth every morning., Disp: 90 tablet, Rfl: 3 .  rosuvastatin (CRESTOR) 10 MG tablet, Take 1 tablet (10 mg total) by mouth daily., Disp: 90 tablet, Rfl: 1 .  sodium fluoride (DENTAGEL) 1.1 % GEL dental gel, , Disp: , Rfl:  .  sulfacetamide (BLEPH-10) 10 % ophthalmic solution, Place 1 drop into both eyes 3 (three) times daily as needed., Disp: , Rfl:  .  triamcinolone cream (KENALOG) 0.1 %, Apply 1 application topically 2 (two) times daily., Disp: 45 g, Rfl: 0 .  vitamin E (VITAMIN E) 400 UNIT capsule, Take 400 Units by mouth daily. Every am, Disp: , Rfl:  .  Mag Aspart-Potassium Aspart (POTASSIUM & MAGNESIUM ASPARTAT PO), Take 1 tablet by mouth daily., Disp: , Rfl:   Allergies  Allergen Reactions  . Cyclobenzaprine Hypertension  . Cheese Other (See Comments)    bloating  . Ciprofloxacin Hcl Other (See Comments)    Muscle pain  . Coconut Oil      ROS   Constitutional: Negative for fever or weight change.  Respiratory: Negative for cough and shortness of breath.   Cardiovascular: Negative for chest pain or palpitations.  Gastrointestinal: Negative for abdominal pain, no bowel changes.  Musculoskeletal: Negative for gait problem or joint swelling.  Skin: Negative for rash.  Neurological: Negative for dizziness or headache.  No other specific complaints in a complete review of systems (except as listed in HPI above).   Objective  Vitals:   08/22/17 0822  BP: 140/60  Pulse: 67  Resp: 16  Temp: 98.3 F (36.8 C)  TempSrc: Oral  SpO2: 98%  Weight: 200 lb 6.4 oz (90.9 kg)  Height: 5\' 3"  (1.6 m)     Body mass index is 35.5 kg/m.  Physical Exam  Constitutional: Patient appears well-developed and well-nourished, obese  No distress.  HENT: Head: Normocephalic and atraumatic. Ears: B TMs ok, no erythema or effusion; Nose: Nose normal. Mouth/Throat: Oropharynx is clear and moist. No oropharyngeal exudate.  Eyes: Conjunctivae and EOM are normal. Pupils are equal, round, and reactive to light. No scleral icterus.  Neck: Normal range of motion. Neck supple. No JVD present. No thyromegaly present.  Cardiovascular: Normal rate, regular rhythm and normal heart sounds.  No murmur heard. No BLE edema. Pulmonary/Chest: Effort normal and breath sounds normal. No respiratory distress. Abdominal: Soft. Bowel sounds are normal, no distension. There is no tenderness. no masses Breast: no lumps or masses, no nipple discharge or rashes FEMALE GENITALIA:  External genitalia vaginal atrophy External urethra normal Vaginal vault normal without discharge or lesions Cervix normal without discharge or lesions Bimanual exam normal without masses RECTAL: not done Musculoskeletal: s/p right knee replacement, decrease rom  No gross deformities Neurological: he is alert and oriented to person, place, and time. No cranial nerve deficit. Coordination, balance, strength, speech and gait are normal.  Skin: Skin is warm and dry. Bruising from insulin injections.  No erythema.  Psychiatric: Patient has a normal mood and affect. behavior is normal. Judgment and thought content normal.   Recent Results (from the past 2160 hour(s))  Urinalysis, Complete     Status: Abnormal   Collection Time: 06/05/17 11:27 AM  Result Value Ref Range   Specific Gravity, UA 1.010 1.005 - 1.030   pH, UA 7.0 5.0 - 7.5   Color, UA Yellow Yellow   Appearance Ur Clear Clear   Leukocytes, UA Trace (A) Negative   Protein, UA Negative Negative/Trace   Glucose, UA Negative Negative   Ketones, UA Negative Negative   RBC, UA Negative  Negative   Bilirubin, UA Negative Negative   Urobilinogen, Ur 0.2 0.2 - 1.0 mg/dL   Nitrite, UA Negative Negative   Microscopic Examination See below:   Microscopic Examination     Status: Abnormal   Collection Time: 06/05/17 11:27 AM  Result Value Ref Range   WBC, UA 0-5 0 - 5 /hpf   RBC, UA None seen 0 - 2 /hpf   Epithelial Cells (non renal) 0-10 0 - 10 /hpf   Bacteria, UA None seen None seen/Few   Yeast, UA Present (A) None seen  CULTURE, URINE COMPREHENSIVE     Status: None   Collection Time: 06/05/17 12:10 PM  Result Value Ref Range   Urine Culture, Comprehensive Final report    Organism ID, Bacteria Comment     Comment: Mixed urogenital flora 5,000  Colonies/mL   Urinalysis, Complete     Status: Abnormal   Collection Time: 06/20/17  2:19 PM  Result Value Ref Range   Specific Gravity, UA 1.010 1.005 - 1.030   pH, UA 6.5 5.0 - 7.5   Color, UA Yellow Yellow   Appearance Ur Clear Clear   Leukocytes, UA Negative Negative   Protein, UA Negative Negative/Trace   Glucose, UA 3+ (A) Negative   Ketones, UA Negative Negative   RBC, UA Negative Negative   Bilirubin, UA Negative Negative   Urobilinogen, Ur 0.2 0.2 - 1.0 mg/dL   Nitrite, UA Negative Negative  Urinalysis, Complete     Status: Abnormal   Collection Time: 07/30/17  3:08 PM  Result Value Ref Range   Specific Gravity, UA 1.015 1.005 - 1.030   pH, UA 7.5 5.0 - 7.5   Color, UA Yellow Yellow   Appearance Ur Cloudy (A) Clear   Leukocytes, UA 3+ (A) Negative   Protein, UA Negative Negative/Trace   Glucose, UA Negative Negative   Ketones, UA Negative Negative   RBC, UA Negative Negative   Bilirubin, UA Negative Negative   Urobilinogen, Ur 0.2 0.2 - 1.0 mg/dL   Nitrite, UA Negative Negative   Microscopic Examination See below:   Microscopic Examination     Status: Abnormal   Collection Time: 07/30/17  3:08 PM  Result Value Ref Range   WBC, UA 11-30 (A) 0 - 5 /hpf   RBC, UA 0-2 0 - 2 /hpf   Epithelial Cells (non  renal) 0-10 0 - 10 /hpf   Renal Epithel, UA 0-10 (A) None seen /hpf   Bacteria, UA Moderate (A) None seen/Few     PHQ2/9: Depression screen [facility] 2/9 08/22/2017 04/08/2017 09/24/2016 08/21/2016 06/18/2016  Decreased Interest 0 0 0 0 0  Down, Depressed, Hopeless 0 0 0 0 0  PHQ - 2 Score 0 0 0 0 0     Fall Risk: Fall Risk  08/22/2017 07/09/2017 04/08/2017 12/17/2016 10/22/2016  Falls in the past year? No  No No No No  Comment - - - - -  Risk for fall due to : - - Impaired mobility - -  Risk for fall due to: Comment - - pain in R knee - -     Functional Status Survey: Is the patient deaf or have difficulty hearing?: No Does the patient have difficulty seeing, even when wearing glasses/contacts?: No Does the patient have difficulty concentrating, remembering, or making decisions?: No Does the patient have difficulty walking or climbing stairs?: No Does the patient have difficulty dressing or bathing?: No Does the patient have difficulty doing errands alone such as visiting a doctor's office or shopping?: No   Assessment & Plan  1. Well woman exam  Discussed with the patient the risk posed by an increased BMI. Discussed importance of portion control, calorie counting and at least 150 minutes of physical activity weekly. Avoid sweet beverages and drink more water. Eat at least 6 servings of fruit and vegetables daily   2. Osteoporosis, post-menopausal  - Comprehensive metabolic panel - CBC with Differential/Platelet - VITAMIN D 25 Hydroxy (Vit-D Deficiency, Fractures) - Parathyroid hormone, intact (no Ca) - TSH  3. Dyslipidemia  - Lipid panel  4. Long-term use of high-risk medication  - Lipid panel - Comprehensive metabolic panel - CBC with Differential/Platelet  5. Essential hypertension  - TSH  6. Vaginal pain  - Pap IG w/ reflex to HPV when ASC-U  7. Encounter for screening for cervical cancer   - Pap IG w/ reflex to HPV when ASC-U  She is aware that medicare may  not cover it

## 2017-08-22 NOTE — Patient Instructions (Addendum)
Preventive Care 65 Years and Older, Female Preventive care refers to lifestyle choices and visits with your health care provider that can promote health and wellness. What does preventive care include?  A yearly physical exam. This is also called an annual well check.  Dental exams once or twice a year.  Routine eye exams. Ask your health care provider how often you should have your eyes checked.  Personal lifestyle choices, including: ? Daily care of your teeth and gums. ? Regular physical activity. ? Eating a healthy diet. ? Avoiding tobacco and drug use. ? Limiting alcohol use. ? Practicing safe sex. ? Taking low-dose aspirin every day. ? Taking vitamin and mineral supplements as recommended by your health care provider. What happens during an annual well check? The services and screenings done by your health care provider during your annual well check will depend on your age, overall health, lifestyle risk factors, and family history of disease. Counseling Your health care provider may ask you questions about your:  Alcohol use.  Tobacco use.  Drug use.  Emotional well-being.  Home and relationship well-being.  Sexual activity.  Eating habits.  History of falls.  Memory and ability to understand (cognition).  Work and work environment.  Reproductive health.  Screening You may have the following tests or measurements:  Height, weight, and BMI.  Blood pressure.  Lipid and cholesterol levels. These may be checked every 5 years, or more frequently if you are over 50 years old.  Skin check.  Lung cancer screening. You may have this screening every year starting at age 55 if you have a 30-pack-year history of smoking and currently smoke or have quit within the past 15 years.  Fecal occult blood test (FOBT) of the stool. You may have this test every year starting at age 50.  Flexible sigmoidoscopy or colonoscopy. You may have a sigmoidoscopy every 5 years or  a colonoscopy every 10 years starting at age 50.  Hepatitis C blood test.  Hepatitis B blood test.  Sexually transmitted disease (STD) testing.  Diabetes screening. This is done by checking your blood sugar (glucose) after you have not eaten for a while (fasting). You may have this done every 1-3 years.  Bone density scan. This is done to screen for osteoporosis. You may have this done starting at age 65.  Mammogram. This may be done every 1-2 years. Talk to your health care provider about how often you should have regular mammograms.  Talk with your health care provider about your test results, treatment options, and if necessary, the need for more tests. Vaccines Your health care provider may recommend certain vaccines, such as:  Influenza vaccine. This is recommended every year.  Tetanus, diphtheria, and acellular pertussis (Tdap, Td) vaccine. You may need a Td booster every 10 years.  Varicella vaccine. You may need this if you have not been vaccinated.  Zoster vaccine. You may need this after age 60.  Measles, mumps, and rubella (MMR) vaccine. You may need at least one dose of MMR if you were born in 1957 or later. You may also need a second dose.  Pneumococcal 13-valent conjugate (PCV13) vaccine. One dose is recommended after age 65.  Pneumococcal polysaccharide (PPSV23) vaccine. One dose is recommended after age 65.  Meningococcal vaccine. You may need this if you have certain conditions.  Hepatitis A vaccine. You may need this if you have certain conditions or if you travel or work in places where you may be exposed to hepatitis   A.  Hepatitis B vaccine. You may need this if you have certain conditions or if you travel or work in places where you may be exposed to hepatitis B.  Haemophilus influenzae type b (Hib) vaccine. You may need this if you have certain conditions.  Talk to your health care provider about which screenings and vaccines you need and how often you  need them. This information is not intended to replace advice given to you by your health care provider. Make sure you discuss any questions you have with your health care provider. Document Released: 07/15/2015 Document Revised: 03/07/2016 Document Reviewed: 04/19/2015 Elsevier Interactive Patient Education  2018 [employer].  Obesity, Adult Obesity is the condition of having too much total body fat. Being overweight or obese means that your weight is greater than what is considered healthy for your body size. Obesity is determined by a measurement called BMI. BMI is an estimate of body fat and is calculated from height and weight. For adults, a BMI of 30 or higher is considered obese. Obesity can eventually lead to other health concerns and major illnesses, including:  Stroke.  Coronary artery disease (CAD).  Type 2 diabetes.  Some types of cancer, including cancers of the colon, breast, uterus, and gallbladder.  Osteoarthritis.  High blood pressure (hypertension).  High cholesterol.  Sleep apnea.  Gallbladder stones.  Infertility problems.  What are the causes? The main cause of obesity is taking in (consuming) more calories than your body uses for energy. Other factors that contribute to this condition may include:  Being born with genes that make you more likely to become obese.  Having a medical condition that causes obesity. These conditions include: ? Hypothyroidism. ? Polycystic ovarian syndrome (PCOS). ? Binge-eating disorder. ? Cushing syndrome.  Taking certain medicines, such as steroids, antidepressants, and seizure medicines.  Not being physically active (sedentary lifestyle).  Living where there are limited places to exercise safely or buy healthy foods.  Not getting enough sleep.  What increases the risk? The following factors may increase your risk of this condition:  Having a family history of obesity.  Being a woman of African-American  descent.  Being a man of Hispanic descent.  What are the signs or symptoms? Having excessive body fat is the main symptom of this condition. How is this diagnosed? This condition may be diagnosed based on:  Your symptoms.  Your medical history.  A physical exam. Your health care provider may measure: ? Your BMI. If you are an adult with a BMI between 25 and less than 30, you are considered overweight. If you are an adult with a BMI of 30 or higher, you are considered obese. ? The distances around your hips and your waist (circumferences). These may be compared to each other to help diagnose your condition. ? Your skinfold thickness. Your health care provider may gently pinch a fold of your skin and measure it.  How is this treated? Treatment for this condition often includes changing your lifestyle. Treatment may include some or all of the following:  Dietary changes. Work with your health care provider and a dietitian to set a weight-loss goal that is healthy and reasonable for you. Dietary changes may include eating: ? Smaller portions. A portion size is the amount of a particular food that is healthy for you to eat at one time. This varies from person to person. ? Low-calorie or low-fat options. ? More whole grains, fruits, and vegetables.  Regular physical activity. This may  include aerobic activity (cardio) and strength training.  Medicine to help you lose weight. Your health care provider may prescribe medicine if you are unable to lose 1 pound a week after 6 weeks of eating more healthily and doing more physical activity.  Surgery. Surgical options may include gastric banding and gastric bypass. Surgery may be done if: ? Other treatments have not helped to improve your condition. ? You have a BMI of 40 or higher. ? You have life-threatening health problems related to obesity.  Follow these instructions at home:  Eating and drinking   Follow recommendations from your  health care provider about what you eat and drink. Your health care provider may advise you to: ? Limit fast foods, sweets, and processed snack foods. ? Choose low-fat options, such as low-fat milk instead of whole milk. ? Eat 5 or more servings of fruits or vegetables every day. ? Eat at home more often. This gives you more control over what you eat. ? Choose healthy foods when you eat out. ? Learn what a healthy portion size is. ? Keep low-fat snacks on hand. ? Avoid sugary drinks, such as soda, fruit juice, iced tea sweetened with sugar, and flavored milk. ? Eat a healthy breakfast.  Drink enough water to keep your urine clear or pale yellow.  Do not go without eating for long periods of time (do not fast) or follow a fad diet. Fasting and fad diets can be unhealthy and even dangerous. Physical Activity  Exercise regularly, as told by your health care provider. Ask your health care provider what types of exercise are safe for you and how often you should exercise.  Warm up and stretch before being active.  Cool down and stretch after being active.  Rest between periods of activity. Lifestyle  Limit the time that you spend in front of your TV, computer, or video game system.  Find ways to reward yourself that do not involve food.  Limit alcohol intake to no more than 1 drink a day for nonpregnant women and 2 drinks a day for men. One drink equals 12 oz of beer, 5 oz of wine, or 1 oz of hard liquor. General instructions  Keep a weight loss journal to keep track of the food you eat and how much you exercise you get.  Take over-the-counter and prescription medicines only as told by your health care provider.  Take vitamins and supplements only as told by your health care provider.  Consider joining a support group. Your health care provider may be able to recommend a support group.  Keep all follow-up visits as told by your health care provider. This is important. Contact a  health care provider if:  You are unable to meet your weight loss goal after 6 weeks of dietary and lifestyle changes. This information is not intended to replace advice given to you by your health care provider. Make sure you discuss any questions you have with your health care provider. Document Released: 07/26/2004 Document Revised: 11/21/2015 Document Reviewed: 04/06/2015 Elsevier Interactive Patient Education  2018 [employer].

## 2017-08-23 ENCOUNTER — Ambulatory Visit: Payer: Medicare Other | Admitting: Family Medicine

## 2017-08-23 LAB — CBC WITH DIFFERENTIAL/PLATELET
BASOS ABS: 50 {cells}/uL (ref 0–200)
Basophils Relative: 0.8 %
EOS ABS: 88 {cells}/uL (ref 15–500)
EOS PCT: 1.4 %
HEMATOCRIT: 39.5 % (ref 35.0–45.0)
Hemoglobin: 13.7 g/dL (ref 11.7–15.5)
Lymphs Abs: 1443 cells/uL (ref 850–3900)
MCH: 31.6 pg (ref 27.0–33.0)
MCHC: 34.7 g/dL (ref 32.0–36.0)
MCV: 91 fL (ref 80.0–100.0)
MONOS PCT: 9.6 %
MPV: 11.3 fL (ref 7.5–12.5)
NEUTROS PCT: 65.3 %
Neutro Abs: 4114 cells/uL (ref 1500–7800)
Platelets: 218 10*3/uL (ref 140–400)
RBC: 4.34 10*6/uL (ref 3.80–5.10)
RDW: 11.8 % (ref 11.0–15.0)
Total Lymphocyte: 22.9 %
WBC mixed population: 605 cells/uL (ref 200–950)
WBC: 6.3 10*3/uL (ref 3.8–10.8)

## 2017-08-23 LAB — COMPREHENSIVE METABOLIC PANEL
AG Ratio: 1.4 (calc) (ref 1.0–2.5)
ALT: 13 U/L (ref 6–29)
AST: 16 U/L (ref 10–35)
Albumin: 3.8 g/dL (ref 3.6–5.1)
Alkaline phosphatase (APISO): 81 U/L (ref 33–130)
BILIRUBIN TOTAL: 0.7 mg/dL (ref 0.2–1.2)
BUN: 14 mg/dL (ref 7–25)
CALCIUM: 9.2 mg/dL (ref 8.6–10.4)
CO2: 31 mmol/L (ref 20–32)
Chloride: 101 mmol/L (ref 98–110)
Creat: 0.64 mg/dL (ref 0.60–0.93)
GLOBULIN: 2.7 g/dL (ref 1.9–3.7)
Glucose, Bld: 156 mg/dL — ABNORMAL HIGH (ref 65–99)
POTASSIUM: 3.8 mmol/L (ref 3.5–5.3)
SODIUM: 140 mmol/L (ref 135–146)
TOTAL PROTEIN: 6.5 g/dL (ref 6.1–8.1)

## 2017-08-23 LAB — TSH: TSH: 3.81 mIU/L (ref 0.40–4.50)

## 2017-08-23 LAB — LIPID PANEL
CHOLESTEROL: 142 mg/dL (ref <200)
HDL: 61 mg/dL (ref >50)
LDL CHOLESTEROL (CALC): 65 mg/dL
Non-HDL Cholesterol (Calc): 81 mg/dL (calc) (ref <130)
Total CHOL/HDL Ratio: 2.3 (calc) (ref <5.0)
Triglycerides: 82 mg/dL (ref <150)

## 2017-08-23 LAB — VITAMIN D 25 HYDROXY (VIT D DEFICIENCY, FRACTURES): VIT D 25 HYDROXY: 28 ng/mL — AB (ref 30–100)

## 2017-08-23 LAB — PARATHYROID HORMONE, INTACT (NO CA): PTH: 31 pg/mL (ref 14–64)

## 2017-08-24 NOTE — Progress Notes (Signed)
Cardiology Office Note  Date:  08/26/2017   ID:  Bonnie, Rowland 01-17-1941, MRN [id number]  PCP:  Steele Sizer, MD   Chief Complaint  Patient presents with  . Other    12 month follow up. Patient denies chest pain and SOB. Meds reviewed verbally with patient.     HPI:  Bonnie Rowland is a very pleasant 77 year old woman with a history of chest pain, normal coronary arteries by cardiac catheterization April 2007 ,  < 50%  renal artery disease,  hyperlipidemia, diabetes, hypertension, obesity who presents for evaluation of recent episode of atrial flutter, status post right total knee replacement surgery  Labs reviewed  Total 142, LDL 65 Glu 156 Bmp good Vit D 28  HAB1c in the 7s Watching diet Did the lifestyle group  No gym for 2 months   maintaining normal sinus rhythm  knee replacement surgery at the end of September 2016. On the right Wears compression hose  Take metoprolol 1/2 pill daily  EKG personally reviewed by myself on todays visit shows normal sinus rhythm rate 75  bpm otherwise no significant ST or T-wave changes  Other past medical history 02/21/15 she presented to [facility] for right total knee replacement by Dr. Marry Guan. She reported developing tachycardia that morning. She had extreme pain, was rushing to get to the hospital. EKG showed atrial flutter with ventricular rate 140 bpm. She was sent to the emergency room, 3 hours later was back in normal sinus rhythm. No medications were given to restore normal sinus rhythm.  Hemoglobin A1c increased on prednisone, into the 8 range, now improved in the 7 range  Previous lab work Total cholesterol 120, LDL in the 50s  She also has a history of sciatica and a meniscus tear on the left. She had a echocardiogram in May 2006 which was essentially normal with ejection fraction 60%.   Stress test in 06, 03 and 01 had been normal.  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  PSH:    Past Surgical History:  Procedure Laterality Date  . APPENDECTOMY    . CARDIAC CATHETERIZATION    . cataract surgery    . COLONOSCOPY    . COLONOSCOPY WITH PROPOFOL N/A 05/09/2016   Procedure: COLONOSCOPY WITH PROPOFOL;  Surgeon: Robert Bellow, MD;  Location: [facility] ENDOSCOPY;  Service: Endoscopy;  Laterality: N/A;  . DIAGNOSTIC MAMMOGRAM    . EYE SURGERY Bilateral    Cataract Extraction  . KNEE ARTHROPLASTY Right 03/30/2015   Procedure: COMPUTER ASSISTED TOTAL KNEE ARTHROPLASTY;  Surgeon: Dereck Leep, MD;  Location: [facility] ORS;  Service: Orthopedics;  Laterality: Right;  . KNEE ARTHROSCOPY Right   . KNEE CLOSED REDUCTION Right 05/23/2015   Procedure: CLOSED MANIPULATION KNEE;  Surgeon: Dereck Leep, MD;  Location: [facility] ORS;  Service: Orthopedics;  Laterality: Right;  . TONSILLECTOMY      Current Outpatient Medications  Medication Sig Dispense Refill  . ALFALFA PO Take 3 tablets by mouth every morning.    Marland Kitchen aspirin 81 MG EC tablet Take 81 mg by mouth at bedtime.     Marland Kitchen  B Complex-C (B-COMPLEX WITH VITAMIN C) tablet Take 2 tablets by mouth at bedtime.     Marland Kitchen BLACK ELDERBERRY,BERRY-FLOWER, PO Take by mouth.    . calcium carbonate (OS-CAL) 600 MG TABS tablet Take 600 mg by mouth daily.    . cholecalciferol (VITAMIN D) 1000 UNITS tablet Take 4,000 Units by mouth daily.     . clobetasol ointment (TEMOVATE) 1.61 % Apply 1 application topically 2 (two) times daily as needed (bug  bites). Reported on 06/30/2015    . Coenzyme Q10 (COQ10) 200 MG CAPS Take 1 tablet by mouth daily. 400 mg every am.    . Continuous Blood Gluc Sensor ([place]) MISC by Does not apply route.    Marland Kitchen estradiol (ESTRACE VAGINAL) 0.1 MG/GM vaginal cream 1/2 gm once weekly using applicator, apply blueberry sized amount of cream using tip of finger to urethra twice weekly 50 g 4  . Exenatide ER 2 MG SRER Inject 2 mg into the skin once a week. 12 each 0  . Eyelid Cleansers (AVENOVA) 0.01 % SOLN See admin instructions.  3  . fish oil-omega-3 fatty acids 1000 MG capsule Take 2 g by mouth daily.      . fluticasone (FLONASE) 50 MCG/ACT nasal spray PLACE 2 SPRAYS INTO BOTH NOSTRILS DAILY. 8.5 g 2  . GARLIC 0960 PO Take 1 tablet by mouth daily. 1 gram    . Glucosamine 500 MG TABS Take 1 tablet by mouth daily.     Marland Kitchen glucose blood (ONE TOUCH ULTRA TEST) test strip 1 each by Other route 3 (three) times daily. Use as instructed 300 each 1  . Insulin Glargine (LANTUS SOLOSTAR) 100 UNIT/ML Solostar Pen Inject 24 Units into the skin every morning. 15 pen 2  . insulin lispro (HUMALOG) 100 UNIT/ML KiwkPen Inject into the skin.    . Insulin Pen Needle 32G X 4 MM MISC 1 pen by Does not apply route 2 (two) times daily. Use as directed 2 times daily 200 each 2  . Lancets (ONETOUCH ULTRASOFT) lancets Use as instructed 100 each 12  . loratadine (CLARITIN) 10 MG tablet Take 1 tablet (10 mg total) by mouth daily. 30 tablet 11  . losartan-hydrochlorothiazide (HYZAAR) 100-25 MG tablet Take 1 tablet by mouth daily. 90 tablet 1  . Mag Aspart-Potassium Aspart (POTASSIUM & MAGNESIUM ASPARTAT PO) Take 1 tablet by mouth daily.    Marland Kitchen MAGNESIUM-POTASSIUM PO Take 1 tablet by mouth 2 (two) times daily. Reported on 06/30/2015    . metoprolol succinate (TOPROL-XL) 25 MG 24 hr tablet Take 1 tablet (25 mg total) by mouth daily. 90 tablet 3  . Multiple Vitamins-Minerals (PRESERVISION AREDS 2) CAPS Take 1 capsule by mouth 2  (two) times daily.    Marland Kitchen nystatin cream (MYCOSTATIN) Apply 1 application topically 2 (two) times daily. 30 g 2  . Probiotic Product (PROBIOTIC PO) Take 1 Dose by mouth at bedtime.    Marland Kitchen Propylene Glycol (SYSTANE BALANCE OP) Apply 1 drop to eye as needed.    . raloxifene (EVISTA) 60 MG tablet Take 1 tablet (60 mg total) by mouth every morning. 90 tablet 3  . rosuvastatin (CRESTOR) 10 MG tablet Take 1 tablet (10 mg total) by mouth daily. 90 tablet 1  . sodium fluoride (DENTAGEL) 1.1 % GEL dental gel     . sulfacetamide (BLEPH-10) 10 % ophthalmic solution Place 1 drop into both eyes 3 (three) times daily as needed.    . triamcinolone cream (KENALOG) 0.1 % Apply 1  application topically 2 (two) times daily. 45 g 0  . vitamin E (VITAMIN E) 400 UNIT capsule Take 400 Units by mouth daily. Every am     No current facility-administered medications for this visit.      Allergies:   Cyclobenzaprine; Cheese; Ciprofloxacin hcl; and Coconut oil   Social History:  The patient  reports that  has never smoked. she has never used smokeless tobacco. She reports that she drinks alcohol. She reports that she does not use drugs.   Family History:   family history includes Breast cancer (age of onset: 19) in her maternal aunt; Cancer in her father and mother; Colon cancer in her son; Diabetes in her mother; Hypertension in her father and mother.    Review of Systems: Review of Systems  Constitutional: Negative.   Respiratory: Negative.   Cardiovascular: Positive for leg swelling.  Gastrointestinal: Negative.   Musculoskeletal: Negative.   Neurological: Negative.   Psychiatric/Behavioral: Negative.   All other systems reviewed and are negative.    PHYSICAL EXAM: VS:  BP 134/60 (BP Location: Left Arm, Patient Position: Sitting, Cuff Size: Normal)   Pulse 75   Ht 5\' 3"  (1.6 m)   Wt 203 lb (92.1 kg)   BMI 35.96 kg/m  , BMI Body mass index is 35.96 kg/m. Constitutional:  oriented to person, place, and  time. No distress.  HENT:  Head: Normocephalic and atraumatic.  Eyes:  no discharge. No scleral icterus.  Neck: Normal range of motion. Neck supple. No JVD present.  Cardiovascular: Normal rate, regular rhythm, normal heart sounds and intact distal pulses. Exam reveals no gallop and no friction rub.  Trace edema left lower extremity No murmur heard. Pulmonary/Chest: Effort normal and breath sounds normal. No stridor. No respiratory distress.  no wheezes.  no rales.  no tenderness.  Abdominal: Soft.  no distension.  no tenderness.  Musculoskeletal: Normal range of motion.  no  tenderness or deformity.  Neurological:  normal muscle tone. Coordination normal. No atrophy Skin: Skin is warm and dry. No rash noted. not diaphoretic.  Psychiatric:  normal mood and affect. behavior is normal. Thought content normal.      Recent Labs: 08/22/2017: ALT 13; BUN 14; Creat 0.64; Hemoglobin 13.7; Platelets 218; Potassium 3.8; Sodium 140; TSH 3.81    Lipid Panel Lab Results  Component Value Date   CHOL 142 08/22/2017   HDL 61 08/22/2017   LDLCALC 52 11/16/2016   TRIG 82 08/22/2017      Wt Readings from Last 3 Encounters:  08/26/17 203 lb (92.1 kg)  08/22/17 200 lb 6.4 oz (90.9 kg)  07/30/17 200 lb (90.7 kg)       ASSESSMENT AND PLAN:  Hyperlipidemia, unspecified hyperlipidemia type - Plan: EKG 12-Lead Cholesterol is at goal on the current lipid regimen. No changes to the medications were made.  Stable  Hypertension, benign - Plan: EKG 12-Lead Blood pressure is well controlled on today's visit. No changes made to the medications. 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 EKG 12-Lead     Signed, Esmond Plants, M.D., Ph.D. 08/26/2017  [place], [place]

## 2017-08-26 ENCOUNTER — Ambulatory Visit (INDEPENDENT_AMBULATORY_CARE_PROVIDER_SITE_OTHER): Payer: Medicare Other | Admitting: Cardiovascular Disease

## 2017-08-26 ENCOUNTER — Encounter: Payer: Self-pay | Admitting: Cardiovascular Disease

## 2017-08-26 VITALS — BP 134/60 | HR 75 | Ht 63.0 in | Wt 203.0 lb

## 2017-08-26 DIAGNOSIS — E1159 Type 2 diabetes mellitus with other circulatory complications: Secondary | ICD-10-CM | POA: Diagnosis not present

## 2017-08-26 DIAGNOSIS — I701 Atherosclerosis of renal artery: Secondary | ICD-10-CM | POA: Diagnosis not present

## 2017-08-26 DIAGNOSIS — E785 Hyperlipidemia, unspecified: Secondary | ICD-10-CM | POA: Diagnosis not present

## 2017-08-26 DIAGNOSIS — R0989 Other specified symptoms and signs involving the circulatory and respiratory systems: Secondary | ICD-10-CM

## 2017-08-26 DIAGNOSIS — I4892 Unspecified atrial flutter: Secondary | ICD-10-CM | POA: Diagnosis not present

## 2017-08-26 DIAGNOSIS — I10 Essential (primary) hypertension: Secondary | ICD-10-CM | POA: Diagnosis not present

## 2017-08-26 DIAGNOSIS — Z794 Long term (current) use of insulin: Secondary | ICD-10-CM

## 2017-08-26 NOTE — Patient Instructions (Signed)

## 2017-08-27 LAB — PAP IG W/ RFLX HPV ASCU

## 2017-08-29 DIAGNOSIS — M9903 Segmental and somatic dysfunction of lumbar region: Secondary | ICD-10-CM | POA: Diagnosis not present

## 2017-08-29 DIAGNOSIS — M9901 Segmental and somatic dysfunction of cervical region: Secondary | ICD-10-CM | POA: Diagnosis not present

## 2017-08-29 DIAGNOSIS — M5033 Other cervical disc degeneration, cervicothoracic region: Secondary | ICD-10-CM | POA: Diagnosis not present

## 2017-08-29 DIAGNOSIS — M5416 Radiculopathy, lumbar region: Secondary | ICD-10-CM | POA: Diagnosis not present

## 2017-09-05 DIAGNOSIS — M5416 Radiculopathy, lumbar region: Secondary | ICD-10-CM | POA: Diagnosis not present

## 2017-09-05 DIAGNOSIS — M5033 Other cervical disc degeneration, cervicothoracic region: Secondary | ICD-10-CM | POA: Diagnosis not present

## 2017-09-05 DIAGNOSIS — M9901 Segmental and somatic dysfunction of cervical region: Secondary | ICD-10-CM | POA: Diagnosis not present

## 2017-09-05 DIAGNOSIS — M9903 Segmental and somatic dysfunction of lumbar region: Secondary | ICD-10-CM | POA: Diagnosis not present

## 2017-09-12 DIAGNOSIS — M9903 Segmental and somatic dysfunction of lumbar region: Secondary | ICD-10-CM | POA: Diagnosis not present

## 2017-09-12 DIAGNOSIS — M5033 Other cervical disc degeneration, cervicothoracic region: Secondary | ICD-10-CM | POA: Diagnosis not present

## 2017-09-12 DIAGNOSIS — M5416 Radiculopathy, lumbar region: Secondary | ICD-10-CM | POA: Diagnosis not present

## 2017-09-12 DIAGNOSIS — M9901 Segmental and somatic dysfunction of cervical region: Secondary | ICD-10-CM | POA: Diagnosis not present

## 2017-09-26 DIAGNOSIS — M9903 Segmental and somatic dysfunction of lumbar region: Secondary | ICD-10-CM | POA: Diagnosis not present

## 2017-09-26 DIAGNOSIS — M5033 Other cervical disc degeneration, cervicothoracic region: Secondary | ICD-10-CM | POA: Diagnosis not present

## 2017-09-26 DIAGNOSIS — M9901 Segmental and somatic dysfunction of cervical region: Secondary | ICD-10-CM | POA: Diagnosis not present

## 2017-09-26 DIAGNOSIS — M5416 Radiculopathy, lumbar region: Secondary | ICD-10-CM | POA: Diagnosis not present

## 2017-09-30 ENCOUNTER — Other Ambulatory Visit: Payer: Self-pay | Admitting: Family Medicine

## 2017-09-30 ENCOUNTER — Other Ambulatory Visit: Payer: Self-pay

## 2017-09-30 ENCOUNTER — Telehealth: Payer: Self-pay

## 2017-09-30 MED ORDER — TELMISARTAN-HCTZ 80-25 MG PO TABS: 1.0000 | ORAL_TABLET | Freq: Every day | ORAL | 0 refills | Status: DC

## 2017-09-30 NOTE — Telephone Encounter (Signed)
You sent me the insurance form to change patient's medication because of the recall. Patient already received a letter. She took her medication to the drug store and they changed her medication to a different lot number and gave her 57 tablets. They also gave her separate medication HCTZ 25 mg and Losartan 100 mg. I sent you a refill request because you said to change it to Micardis 80-25 mg. Please advise.

## 2017-09-30 NOTE — Telephone Encounter (Signed)
Prescription changed because of recall on previous medication Losartan-HCTZ is no longer available at her pharmacy.

## 2017-09-30 NOTE — Telephone Encounter (Signed)
Filled micardis hctz , please let her know it is in place of losartan hctz

## 2017-10-01 NOTE — Telephone Encounter (Signed)
Called patient she would like to know what the Micardis is and how does it work versus the [employer] She has 90 pills and she does not want to switch until she is out of the Losartan-HCTZ. She would also like to know is it ok for her to take the growth hormone according to her lab results. Please advise.

## 2017-10-01 NOTE — Telephone Encounter (Signed)
Spoke to patient and she will change to MIcardis/HCTZ, she has some of the losartan and HCTZ that is not from the affected batch and she will finish that first.  She will speak to Dr. Manfred Shirts on her next visit about osteoporosis therapy .

## 2017-10-03 ENCOUNTER — Telehealth: Payer: Self-pay

## 2017-10-03 DIAGNOSIS — M5033 Other cervical disc degeneration, cervicothoracic region: Secondary | ICD-10-CM | POA: Diagnosis not present

## 2017-10-03 DIAGNOSIS — M9903 Segmental and somatic dysfunction of lumbar region: Secondary | ICD-10-CM | POA: Diagnosis not present

## 2017-10-03 DIAGNOSIS — M5416 Radiculopathy, lumbar region: Secondary | ICD-10-CM | POA: Diagnosis not present

## 2017-10-03 DIAGNOSIS — M9901 Segmental and somatic dysfunction of cervical region: Secondary | ICD-10-CM | POA: Diagnosis not present

## 2017-10-03 NOTE — Telephone Encounter (Signed)
Faxed Bone Density notes to Dr. Dina Rich Office.

## 2017-10-03 NOTE — Telephone Encounter (Signed)
Copied from [employer]. Topic: Referral - Medical Records >> Oct 03, 2017 12:29 PM Lennox Solders wrote: Reason for CRM: pt was referred last year to dr Judithann Sheen for diabetes. Dr Honor Junes needs a copy of pt bone density test. Pt was referred by dr Ancil Boozer. Dr Honor Junes phone number [phone] and fax number [phone]

## 2017-10-10 DIAGNOSIS — M5416 Radiculopathy, lumbar region: Secondary | ICD-10-CM | POA: Diagnosis not present

## 2017-10-10 DIAGNOSIS — M9903 Segmental and somatic dysfunction of lumbar region: Secondary | ICD-10-CM | POA: Diagnosis not present

## 2017-10-10 DIAGNOSIS — M9901 Segmental and somatic dysfunction of cervical region: Secondary | ICD-10-CM | POA: Diagnosis not present

## 2017-10-10 DIAGNOSIS — M5033 Other cervical disc degeneration, cervicothoracic region: Secondary | ICD-10-CM | POA: Diagnosis not present

## 2017-10-17 DIAGNOSIS — M5033 Other cervical disc degeneration, cervicothoracic region: Secondary | ICD-10-CM | POA: Diagnosis not present

## 2017-10-17 DIAGNOSIS — M9903 Segmental and somatic dysfunction of lumbar region: Secondary | ICD-10-CM | POA: Diagnosis not present

## 2017-10-17 DIAGNOSIS — M9901 Segmental and somatic dysfunction of cervical region: Secondary | ICD-10-CM | POA: Diagnosis not present

## 2017-10-17 DIAGNOSIS — M5416 Radiculopathy, lumbar region: Secondary | ICD-10-CM | POA: Diagnosis not present

## 2017-10-24 DIAGNOSIS — M5416 Radiculopathy, lumbar region: Secondary | ICD-10-CM | POA: Diagnosis not present

## 2017-10-24 DIAGNOSIS — M5033 Other cervical disc degeneration, cervicothoracic region: Secondary | ICD-10-CM | POA: Diagnosis not present

## 2017-10-24 DIAGNOSIS — M9903 Segmental and somatic dysfunction of lumbar region: Secondary | ICD-10-CM | POA: Diagnosis not present

## 2017-10-24 DIAGNOSIS — M9901 Segmental and somatic dysfunction of cervical region: Secondary | ICD-10-CM | POA: Diagnosis not present

## 2017-10-25 DIAGNOSIS — D2272 Melanocytic nevi of left lower limb, including hip: Secondary | ICD-10-CM | POA: Diagnosis not present

## 2017-10-25 DIAGNOSIS — Z85828 Personal history of other malignant neoplasm of skin: Secondary | ICD-10-CM | POA: Diagnosis not present

## 2017-10-25 DIAGNOSIS — L298 Other pruritus: Secondary | ICD-10-CM | POA: Diagnosis not present

## 2017-10-25 DIAGNOSIS — L820 Inflamed seborrheic keratosis: Secondary | ICD-10-CM | POA: Diagnosis not present

## 2017-10-25 DIAGNOSIS — D225 Melanocytic nevi of trunk: Secondary | ICD-10-CM | POA: Diagnosis not present

## 2017-10-25 DIAGNOSIS — Z08 Encounter for follow-up examination after completed treatment for malignant neoplasm: Secondary | ICD-10-CM | POA: Diagnosis not present

## 2017-10-25 DIAGNOSIS — D2262 Melanocytic nevi of left upper limb, including shoulder: Secondary | ICD-10-CM | POA: Diagnosis not present

## 2017-10-25 DIAGNOSIS — L538 Other specified erythematous conditions: Secondary | ICD-10-CM | POA: Diagnosis not present

## 2017-10-25 DIAGNOSIS — D2261 Melanocytic nevi of right upper limb, including shoulder: Secondary | ICD-10-CM | POA: Diagnosis not present

## 2017-10-25 DIAGNOSIS — Z85820 Personal history of malignant melanoma of skin: Secondary | ICD-10-CM | POA: Diagnosis not present

## 2017-10-25 DIAGNOSIS — D2271 Melanocytic nevi of right lower limb, including hip: Secondary | ICD-10-CM | POA: Diagnosis not present

## 2017-10-31 DIAGNOSIS — M9903 Segmental and somatic dysfunction of lumbar region: Secondary | ICD-10-CM | POA: Diagnosis not present

## 2017-10-31 DIAGNOSIS — M9901 Segmental and somatic dysfunction of cervical region: Secondary | ICD-10-CM | POA: Diagnosis not present

## 2017-10-31 DIAGNOSIS — M5033 Other cervical disc degeneration, cervicothoracic region: Secondary | ICD-10-CM | POA: Diagnosis not present

## 2017-10-31 DIAGNOSIS — M5416 Radiculopathy, lumbar region: Secondary | ICD-10-CM | POA: Diagnosis not present

## 2017-11-05 DIAGNOSIS — E113299 Type 2 diabetes mellitus with mild nonproliferative diabetic retinopathy without macular edema, unspecified eye: Secondary | ICD-10-CM | POA: Diagnosis not present

## 2017-11-05 DIAGNOSIS — I10 Essential (primary) hypertension: Secondary | ICD-10-CM | POA: Diagnosis not present

## 2017-11-05 DIAGNOSIS — M810 Age-related osteoporosis without current pathological fracture: Secondary | ICD-10-CM | POA: Insufficient documentation

## 2017-11-05 DIAGNOSIS — E785 Hyperlipidemia, unspecified: Secondary | ICD-10-CM | POA: Diagnosis not present

## 2017-11-05 DIAGNOSIS — E1159 Type 2 diabetes mellitus with other circulatory complications: Secondary | ICD-10-CM | POA: Diagnosis not present

## 2017-11-05 DIAGNOSIS — E1165 Type 2 diabetes mellitus with hyperglycemia: Secondary | ICD-10-CM | POA: Diagnosis not present

## 2017-11-05 DIAGNOSIS — Z794 Long term (current) use of insulin: Secondary | ICD-10-CM | POA: Diagnosis not present

## 2017-11-05 DIAGNOSIS — E1169 Type 2 diabetes mellitus with other specified complication: Secondary | ICD-10-CM | POA: Diagnosis not present

## 2017-11-05 LAB — HEMOGLOBIN A1C: HEMOGLOBIN A1C: 7.6

## 2017-11-07 DIAGNOSIS — M9903 Segmental and somatic dysfunction of lumbar region: Secondary | ICD-10-CM | POA: Diagnosis not present

## 2017-11-07 DIAGNOSIS — M5033 Other cervical disc degeneration, cervicothoracic region: Secondary | ICD-10-CM | POA: Diagnosis not present

## 2017-11-07 DIAGNOSIS — M5416 Radiculopathy, lumbar region: Secondary | ICD-10-CM | POA: Diagnosis not present

## 2017-11-07 DIAGNOSIS — M9901 Segmental and somatic dysfunction of cervical region: Secondary | ICD-10-CM | POA: Diagnosis not present

## 2017-11-14 DIAGNOSIS — M5416 Radiculopathy, lumbar region: Secondary | ICD-10-CM | POA: Diagnosis not present

## 2017-11-14 DIAGNOSIS — M9903 Segmental and somatic dysfunction of lumbar region: Secondary | ICD-10-CM | POA: Diagnosis not present

## 2017-11-14 DIAGNOSIS — M5033 Other cervical disc degeneration, cervicothoracic region: Secondary | ICD-10-CM | POA: Diagnosis not present

## 2017-11-14 DIAGNOSIS — M9901 Segmental and somatic dysfunction of cervical region: Secondary | ICD-10-CM | POA: Diagnosis not present

## 2017-11-18 DIAGNOSIS — M9901 Segmental and somatic dysfunction of cervical region: Secondary | ICD-10-CM | POA: Diagnosis not present

## 2017-11-18 DIAGNOSIS — M5416 Radiculopathy, lumbar region: Secondary | ICD-10-CM | POA: Diagnosis not present

## 2017-11-18 DIAGNOSIS — M5033 Other cervical disc degeneration, cervicothoracic region: Secondary | ICD-10-CM | POA: Diagnosis not present

## 2017-11-18 DIAGNOSIS — M9903 Segmental and somatic dysfunction of lumbar region: Secondary | ICD-10-CM | POA: Diagnosis not present

## 2017-11-28 DIAGNOSIS — M9901 Segmental and somatic dysfunction of cervical region: Secondary | ICD-10-CM | POA: Diagnosis not present

## 2017-11-28 DIAGNOSIS — M5033 Other cervical disc degeneration, cervicothoracic region: Secondary | ICD-10-CM | POA: Diagnosis not present

## 2017-11-28 DIAGNOSIS — M5416 Radiculopathy, lumbar region: Secondary | ICD-10-CM | POA: Diagnosis not present

## 2017-11-28 DIAGNOSIS — M9903 Segmental and somatic dysfunction of lumbar region: Secondary | ICD-10-CM | POA: Diagnosis not present

## 2017-12-05 DIAGNOSIS — M5416 Radiculopathy, lumbar region: Secondary | ICD-10-CM | POA: Diagnosis not present

## 2017-12-05 DIAGNOSIS — M9903 Segmental and somatic dysfunction of lumbar region: Secondary | ICD-10-CM | POA: Diagnosis not present

## 2017-12-05 DIAGNOSIS — M5033 Other cervical disc degeneration, cervicothoracic region: Secondary | ICD-10-CM | POA: Diagnosis not present

## 2017-12-05 DIAGNOSIS — M9901 Segmental and somatic dysfunction of cervical region: Secondary | ICD-10-CM | POA: Diagnosis not present

## 2017-12-05 DIAGNOSIS — M810 Age-related osteoporosis without current pathological fracture: Secondary | ICD-10-CM | POA: Diagnosis not present

## 2017-12-12 DIAGNOSIS — M5416 Radiculopathy, lumbar region: Secondary | ICD-10-CM | POA: Diagnosis not present

## 2017-12-12 DIAGNOSIS — M9903 Segmental and somatic dysfunction of lumbar region: Secondary | ICD-10-CM | POA: Diagnosis not present

## 2017-12-12 DIAGNOSIS — M9901 Segmental and somatic dysfunction of cervical region: Secondary | ICD-10-CM | POA: Diagnosis not present

## 2017-12-12 DIAGNOSIS — M5033 Other cervical disc degeneration, cervicothoracic region: Secondary | ICD-10-CM | POA: Diagnosis not present

## 2017-12-16 ENCOUNTER — Encounter: Payer: Self-pay | Admitting: Nurse Practitioner

## 2017-12-16 ENCOUNTER — Ambulatory Visit (INDEPENDENT_AMBULATORY_CARE_PROVIDER_SITE_OTHER): Payer: Medicare Other | Admitting: Nurse Practitioner

## 2017-12-16 VITALS — BP 128/68 | HR 84 | Temp 98.5°F | Resp 16 | Ht 63.0 in | Wt 206.4 lb

## 2017-12-16 DIAGNOSIS — S61212A Laceration without foreign body of right middle finger without damage to nail, initial encounter: Secondary | ICD-10-CM | POA: Diagnosis not present

## 2017-12-16 DIAGNOSIS — L089 Local infection of the skin and subcutaneous tissue, unspecified: Secondary | ICD-10-CM | POA: Diagnosis not present

## 2017-12-16 DIAGNOSIS — S61213A Laceration without foreign body of left middle finger without damage to nail, initial encounter: Secondary | ICD-10-CM

## 2017-12-16 MED ORDER — AMOXICILLIN-POT CLAVULANATE 875-125 MG PO TABS: 1.0000 | ORAL_TABLET | Freq: Two times a day (BID) | ORAL | 0 refills | Status: DC

## 2017-12-16 NOTE — Progress Notes (Addendum)
Name: Bonnie Rowland   MRN: [id number]    DOB: 22-Oct-1940   Date:12/16/2017       Progress Note  Subjective  Chief Complaint  Chief Complaint  Patient presents with  . Laceration    on middle finger right hand for 4 days, infected    HPI pt was cutting tomatoes and onions on friday and accidently cut her rigth middle finger with a knife. Cleaned it well with water, hydrogen peroxide, alcohol. Then used neosporin and was doing warm soaks. Areas still became red and painful. Had some augmentin left took three doses saturday evening and noticed some improvement redness on finger retreated. Pt has full ROM of finger. No fevers, chills. sts wants augmentin as it has been working and had has issues with other antibiotics in the past. Tetanus UTD   Patient Active Problem List   Diagnosis Date Noted  . Age-related osteoporosis without current pathological fracture 11/05/2017  . 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([place]) 07/09/2017  . Atherosclerosis of abdominal aorta ([place]) 07/09/2017  . Bilateral carotid bruits 08/24/2015  . 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 Atrial flutter ([place]) 02/22/2015  . Chronic pain 01/10/2015  . 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 Fatigue 01/10/2015  . History of melanoma excision 01/10/2015  . Adiposity 01/10/2015  . 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 Spinal stenosis, lumbar region, 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  . DDD (degenerative disc disease), lumbar 11/14/2014  . Greater trochanteric bursitis of both hips 11/14/2014  . Piriformis syndrome 11/14/2014  . 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 Hyperlipemia 11/07/2009  . Hypertension, benign 11/07/2009  . Atherosclerosis of renal artery ([place]) 11/07/2009  . Hyperlipidemia due to type 2 diabetes mellitus ([place]) 11/07/2009  . Perennial allergic rhinitis 11/27/2007  . Lumbosacral neuritis 01/17/2007    Past  Medical History:  Diagnosis Date  . Acute cystitis   . Allergic rhinitis, cause unspecified   . Atherosclerosis of renal artery ([place])    left  . Bronchitis, not specified as acute or chronic   . Cancer ([place]) 12/2013   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; left shoulder blade  . Cancer ([place]) 05/2014   basal cell removed left temple  . Cellulitis and abscess of leg, except foot   . Conjunctivitis unspecified   . Dermatophytosis of nail   . Diabetes mellitus    type II  . Esophageal reflux   . Hyperlipidemia   . Hypertension   . 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 Renal artery stenosis ([place])   . Sprain of lumbar region   . Thoracic or lumbosacral neuritis or radiculitis, unspecified   . Urinary tract infection, site not specified     Past Surgical History:  Procedure Laterality Date  . APPENDECTOMY    . CARDIAC CATHETERIZATION    . cataract surgery    . COLONOSCOPY    . COLONOSCOPY WITH PROPOFOL N/A 05/09/2016   Procedure: COLONOSCOPY WITH PROPOFOL;  Surgeon: Robert Bellow, MD;  Location: [facility] ENDOSCOPY;  Service: Endoscopy;  Laterality: N/A;  . DIAGNOSTIC MAMMOGRAM    . EYE SURGERY Bilateral    Cataract Extraction  . KNEE ARTHROPLASTY Right 03/30/2015   Procedure: COMPUTER ASSISTED TOTAL KNEE ARTHROPLASTY;  Surgeon: Dereck Leep, MD;  Location: [facility] ORS;  Service: Orthopedics;  Laterality: Right;  . KNEE ARTHROSCOPY Right   . KNEE CLOSED REDUCTION Right 05/23/2015   Procedure: CLOSED MANIPULATION KNEE;  Surgeon: Dereck Leep, MD;  Location: [facility] ORS;  Service: Orthopedics;  Laterality: Right;  . TONSILLECTOMY      Social History   Tobacco Use  . Smoking status: Never Smoker  . Smokeless tobacco: Never Used  Substance Use Topics  . Alcohol use: Yes    Alcohol/week: 0.0 oz    Comment: occ     Current Outpatient Medications:  .  ALFALFA PO, Take 3 tablets by mouth every morning., Disp: , Rfl:  .  aspirin 81 MG EC tablet, Take 81 mg by mouth at bedtime. , Disp: , Rfl:  .  B  Complex-C (B-COMPLEX WITH VITAMIN C) tablet, Take 2 tablets by mouth at bedtime. , Disp: , Rfl:  .  BLACK ELDERBERRY,BERRY-FLOWER, PO, Take by mouth., Disp: , Rfl:  .  calcium carbonate (OS-CAL) 600 MG TABS tablet, Take 600 mg by mouth daily., Disp: , Rfl:  .  cholecalciferol (VITAMIN D) 1000 UNITS tablet, Take 4,000 Units by mouth daily. , Disp: , Rfl:  .  clobetasol ointment (TEMOVATE) 5.42 %, Apply 1 application topically 2 (two) times daily as needed (bug bites). Reported on 06/30/2015, Disp: , Rfl:  .  Coenzyme Q10 (COQ10) 200 MG CAPS, Take 1 tablet by mouth daily. 400 mg every am., Disp: , Rfl:  .  Continuous Blood Gluc Sensor ([place]) MISC, by Does not apply route., Disp: , Rfl:  .  estradiol (ESTRACE VAGINAL) 0.1 MG/GM vaginal cream, 1/2 gm once weekly using applicator, apply blueberry sized amount of cream using tip of finger to urethra twice weekly, Disp: 50 g, Rfl: 4 .  Exenatide ER 2 MG SRER, Inject 2 mg into the skin once a week., Disp: 12 each, Rfl: 0 .  Eyelid Cleansers (AVENOVA) 0.01 % SOLN, See admin instructions., Disp: , Rfl: 3 .  fish oil-omega-3 fatty acids 1000 MG capsule, Take 2 g by mouth daily.  , Disp: , Rfl:  .  fluticasone (FLONASE) 50 MCG/ACT nasal spray, PLACE 2 SPRAYS INTO BOTH NOSTRILS DAILY., Disp: 8.5 g, Rfl: 2 .  GARLIC 7062 PO, Take 1 tablet by mouth daily. 1 gram, Disp: , Rfl:  .  Glucosamine 500 MG TABS, Take 1 tablet by mouth daily. , Disp: , Rfl:  .  glucose blood (ONE TOUCH ULTRA TEST) test strip, 1 each by Other route 3 (three) times daily. Use as instructed, Disp: 300 each, Rfl: 1 .  HUMALOG KWIKPEN 100 UNIT/ML KiwkPen, Inject 6 Units into the skin 3 (three) times daily after meals., Disp: , Rfl: 3 .  hydrochlorothiazide (HYDRODIURIL) 25 MG tablet, Take 1 tablet by mouth daily., Disp: , Rfl: 0 .  Insulin Glargine (LANTUS SOLOSTAR) 100 UNIT/ML Solostar Pen, Inject 24 Units into the skin every morning., Disp: 15 pen, Rfl: 2 .  Insulin  Pen Needle 32G X 4 MM MISC, 1 pen by Does not apply route 2 (two) times daily. Use as directed 2 times daily, Disp: 200 each, Rfl: 2 .  Lancets (ONETOUCH ULTRASOFT) lancets, Use as instructed, Disp: 100 each, Rfl: 12 .  loratadine (CLARITIN) 10 MG tablet, Take 1 tablet (10 mg total) by mouth daily., Disp: 30 tablet, Rfl: 11 .  losartan (COZAAR) 100 MG tablet, Take 1 tablet by mouth daily., Disp: , Rfl: 0 .  Mag Aspart-Potassium Aspart (POTASSIUM & MAGNESIUM ASPARTAT PO), Take 1 tablet by mouth daily., Disp: , Rfl:  .  MAGNESIUM-POTASSIUM PO, Take 1 tablet by mouth 2 (two) times daily. Reported on 06/30/2015, Disp: , Rfl:  .  metoprolol  succinate (TOPROL-XL) 25 MG 24 hr tablet, Take 1 tablet (25 mg total) by mouth daily., Disp: 90 tablet, Rfl: 3 .  Multiple Vitamins-Minerals (PRESERVISION AREDS 2) CAPS, Take 1 capsule by mouth 2 (two) times daily., Disp: , Rfl:  .  nystatin cream (MYCOSTATIN), Apply 1 application topically 2 (two) times daily., Disp: 30 g, Rfl: 2 .  Probiotic Product (PROBIOTIC PO), Take 1 Dose by mouth at bedtime., Disp: , Rfl:  .  Propylene Glycol (SYSTANE BALANCE OP), Apply 1 drop to eye as needed., Disp: , Rfl:  .  rosuvastatin (CRESTOR) 10 MG tablet, Take 1 tablet (10 mg total) by mouth daily., Disp: 90 tablet, Rfl: 1 .  sodium fluoride (DENTAGEL) 1.1 % GEL dental gel, , Disp: , Rfl:  .  sulfacetamide (BLEPH-10) 10 % ophthalmic solution, Place 1 drop into both eyes 3 (three) times daily as needed., Disp: , Rfl:  .  telmisartan-hydrochlorothiazide (MICARDIS HCT) 80-25 MG tablet, Take 1 tablet by mouth daily. Same as losartan/hctz - recalled, Disp: 90 tablet, Rfl: 0 .  triamcinolone cream (KENALOG) 0.1 %, Apply 1 application topically 2 (two) times daily., Disp: 45 g, Rfl: 0 .  vitamin E (VITAMIN E) 400 UNIT capsule, Take 400 Units by mouth daily. Every am, Disp: , Rfl:  .  Zoledronic Acid (RECLAST IV), Inject into the vein., Disp: , Rfl:  .  raloxifene (EVISTA) 60 MG tablet,  Take 1 tablet (60 mg total) by mouth every morning. (Patient not taking: Reported on 12/16/2017), Disp: 90 tablet, Rfl: 3  Allergies  Allergen Reactions  . Cyclobenzaprine Hypertension  . Cheese Other (See Comments)    bloating  . Ciprofloxacin Hcl Other (See Comments)    Muscle pain  . Coconut Oil     ROS   No other specific complaints in a complete review of systems (except as listed in HPI above).  Objective  Vitals:   12/16/17 1327  BP: 128/68  Pulse: 84  Resp: 16  Temp: 98.5 F (36.9 C)  TempSrc: Oral  SpO2: 93%  Weight: 206 lb 6.4 oz (93.6 kg)  Height: 5\' 3"  (1.6 m)     Body mass index is 36.56 kg/m.  Nursing Note and Vital Signs reviewed.  Physical Exam   Constitutional: Patient appears well-developed and well-nourished. o distress.  Cardiovascular: Normal rate, regular rhythm, S1/S2 present.   Pulmonary/Chest: Effort normal and breath sounds clear.  Skin: left hand, middle finger has closed 1-2cm laceration with surrounding redness not passing the joints, warm to touch non-tender Psychiatric: Patient has a normal mood and affect. behavior is normal. Judgment and thought content normal.  No results found for this or any previous visit (from the past 72 hour(s)).  Assessment & Plan  1. Skin infection  - amoxicillin-clavulanate (AUGMENTIN) 875-125 MG tablet; Take 1 tablet by mouth 2 (two) times daily.  Dispense: 20 tablet; Refill: 0  2. Laceration of left middle finger without foreign body without damage to nail, initial encounter  - amoxicillin-clavulanate (AUGMENTIN) 875-125 MG tablet; Take 1 tablet by mouth 2 (two) times daily.  Dispense: 20 tablet; Refill: 0   -Red flags and when to present for emergency care or RTC including fever >101.4F, drainage, worsening redness new/worsening/un-resolving symptoms, reviewed with patient at time of visit. Follow up and care instructions discussed and provided in AVS. -Reviewed Health Maintenance:    ---------------------------------------------- I have reviewed this encounter including the documentation in this note and/or discussed this patient with the provider, Suezanne Cheshire DNP AGNP-C. I am  certifying that I agree with the content of this note as supervising physician. Enid Derry, [facility] 12/17/2017, 5:33 PM

## 2017-12-16 NOTE — Patient Instructions (Signed)
-Augmentin sent to pharmacy please take antibiotic until completely finished even if finger is improved before. - If not improving or worsening call us and come in sooner for re-evaluation and to switch antibiotics.  -Please do eat yogurt daily or take a probiotic daily for the next month or two. We want to replace the healthy germs in the gut. If you notice foul, watery diarrhea in the next two months, schedule an appointment RIGHT AWAY  Laceration Care, Adult  A laceration is a cut that goes through all layers of the skin. The cut also goes into the tissue that is right under the skin. Some cuts heal on their own. Others need to be closed with stitches (sutures), staples, skin adhesive strips, or wound glue. Taking care of your cut lowers your risk of infection and helps your cut to heal better. How to take care of your cut For stitches or staples:  Keep the wound clean and dry.  If you were given a bandage (dressing), you should change it at least one time per day or as told by your doctor. You should also change it if it gets wet or dirty.  Keep the wound completely dry for the first 24 hours or as told by your doctor. After that time, you may take a shower or a bath. However, make sure that the wound is not soaked in water until after the stitches or staples have been removed.  Clean the wound one time each day or as told by your doctor: ? Wash the wound with soap and water. ? Rinse the wound with water until all of the soap comes off. ? Pat the wound dry with a clean towel. Do not rub the wound.  After you clean the wound, put a thin layer of antibiotic ointment on it as told by your doctor. This ointment: ? Helps to prevent infection. ? Keeps the bandage from sticking to the wound.  Have your stitches or staples removed as told by your doctor. If your doctor used skin adhesive strips:  Keep the wound clean and dry.  If you were given a bandage, you should change it at least one  time per day or as told by your doctor. You should also change it if it gets dirty or wet.  Do not get the skin adhesive strips wet. You can take a shower or a bath, but be careful to keep the wound dry.  If the wound gets wet, pat it dry with a clean towel. Do not rub the wound.  Skin adhesive strips fall off on their own. You can trim the strips as the wound heals. Do not remove any strips that are still stuck to the wound. They will fall off after a while. If your doctor used wound glue:  Try to keep your wound dry, but you may briefly wet it in the shower or bath. Do not soak the wound in water, such as by swimming.  After you take a shower or a bath, gently pat the wound dry with a clean towel. Do not rub the wound.  Do not do any activities that will make you really sweaty until the skin glue has fallen off on its own.  Do not apply liquid, cream, or ointment medicine to your wound while the skin glue is still on.  If you were given a bandage, you should change it at least one time per day or as told by your doctor. You should also change it  if it gets dirty or wet.  If a bandage is placed over the wound, do not let the tape for the bandage touch the skin glue.  Do not pick at the glue. The skin glue usually stays on for 5-10 days. Then, it falls off of the skin. General Instructions  To help prevent scarring, make sure to cover your wound with sunscreen whenever you are outside after stitches are removed, after adhesive strips are removed, or when wound glue stays in place and the wound is healed. Make sure to wear a sunscreen of at least 30 SPF.  Take over-the-counter and prescription medicines only as told by your doctor.  If you were given antibiotic medicine or ointment, take or apply it as told by your doctor. Do not stop using the antibiotic even if your wound is getting better.  Do not scratch or pick at the wound.  Keep all follow-up visits as told by your doctor. This  is important.  Check your wound every day for signs of infection. Watch for: ? Redness, swelling, or pain. ? Fluid, blood, or pus.  Raise (elevate) the injured area above the level of your heart while you are sitting or lying down, if possible. Get help if:  You got a tetanus shot and you have any of these problems at the injection site: ? Swelling. ? Very bad pain. ? Redness. ? Bleeding.  You have a fever.  A wound that was closed breaks open.  You notice a bad smell coming from your wound or your bandage.  You notice something coming out of the wound, such as wood or glass.  Medicine does not help your pain.  You have more redness, swelling, or pain at the site of your wound.  You have fluid, blood, or pus coming from your wound.  You notice a change in the color of your skin near your wound.  You need to change the bandage often because fluid, blood, or pus is coming from the wound.  You start to have a new rash.  You start to have numbness around the wound. Get help right away if:  You have very bad swelling around the wound.  Your pain suddenly gets worse and is very bad.  You notice painful lumps near the wound or on skin that is anywhere on your body.  You have a red streak going away from your wound.  The wound is on your hand or foot and you cannot move a finger or toe like you usually can.  The wound is on your hand or foot and you notice that your fingers or toes look pale or bluish. This information is not intended to replace advice given to you by your health care provider. Make sure you discuss any questions you have with your health care provider. Document Released: 12/05/2007 Document Revised: 11/24/2015 Document Reviewed: 06/14/2014 Elsevier Interactive Patient Education  [employer].

## 2017-12-19 DIAGNOSIS — M9903 Segmental and somatic dysfunction of lumbar region: Secondary | ICD-10-CM | POA: Diagnosis not present

## 2017-12-19 DIAGNOSIS — M9901 Segmental and somatic dysfunction of cervical region: Secondary | ICD-10-CM | POA: Diagnosis not present

## 2017-12-19 DIAGNOSIS — M5033 Other cervical disc degeneration, cervicothoracic region: Secondary | ICD-10-CM | POA: Diagnosis not present

## 2017-12-19 DIAGNOSIS — M5416 Radiculopathy, lumbar region: Secondary | ICD-10-CM | POA: Diagnosis not present

## 2017-12-26 DIAGNOSIS — M9901 Segmental and somatic dysfunction of cervical region: Secondary | ICD-10-CM | POA: Diagnosis not present

## 2017-12-26 DIAGNOSIS — M5416 Radiculopathy, lumbar region: Secondary | ICD-10-CM | POA: Diagnosis not present

## 2017-12-26 DIAGNOSIS — M5033 Other cervical disc degeneration, cervicothoracic region: Secondary | ICD-10-CM | POA: Diagnosis not present

## 2017-12-26 DIAGNOSIS — M9903 Segmental and somatic dysfunction of lumbar region: Secondary | ICD-10-CM | POA: Diagnosis not present

## 2018-01-01 DIAGNOSIS — M9903 Segmental and somatic dysfunction of lumbar region: Secondary | ICD-10-CM | POA: Diagnosis not present

## 2018-01-01 DIAGNOSIS — M5033 Other cervical disc degeneration, cervicothoracic region: Secondary | ICD-10-CM | POA: Diagnosis not present

## 2018-01-01 DIAGNOSIS — M9901 Segmental and somatic dysfunction of cervical region: Secondary | ICD-10-CM | POA: Diagnosis not present

## 2018-01-01 DIAGNOSIS — M5416 Radiculopathy, lumbar region: Secondary | ICD-10-CM | POA: Diagnosis not present

## 2018-01-06 ENCOUNTER — Ambulatory Visit (INDEPENDENT_AMBULATORY_CARE_PROVIDER_SITE_OTHER): Payer: Medicare Other | Admitting: Family Medicine

## 2018-01-06 ENCOUNTER — Encounter: Payer: Self-pay | Admitting: Family Medicine

## 2018-01-06 VITALS — BP 132/64 | HR 70 | Temp 98.6°F | Resp 16 | Ht 63.0 in | Wt 205.1 lb

## 2018-01-06 DIAGNOSIS — D692 Other nonthrombocytopenic purpura: Secondary | ICD-10-CM | POA: Diagnosis not present

## 2018-01-06 DIAGNOSIS — I10 Essential (primary) hypertension: Secondary | ICD-10-CM

## 2018-01-06 DIAGNOSIS — E1169 Type 2 diabetes mellitus with other specified complication: Secondary | ICD-10-CM | POA: Diagnosis not present

## 2018-01-06 DIAGNOSIS — I701 Atherosclerosis of renal artery: Secondary | ICD-10-CM

## 2018-01-06 DIAGNOSIS — W57XXXD Bitten or stung by nonvenomous insect and other nonvenomous arthropods, subsequent encounter: Secondary | ICD-10-CM | POA: Diagnosis not present

## 2018-01-06 DIAGNOSIS — B372 Candidiasis of skin and nail: Secondary | ICD-10-CM | POA: Diagnosis not present

## 2018-01-06 DIAGNOSIS — M810 Age-related osteoporosis without current pathological fracture: Secondary | ICD-10-CM | POA: Diagnosis not present

## 2018-01-06 DIAGNOSIS — E785 Hyperlipidemia, unspecified: Secondary | ICD-10-CM | POA: Diagnosis not present

## 2018-01-06 MED ORDER — FLUCONAZOLE 150 MG PO TABS
150.0000 mg | ORAL_TABLET | ORAL | 0 refills | Status: DC
Start: 2018-01-06 — End: 2018-07-10

## 2018-01-06 MED ORDER — LOSARTAN POTASSIUM-HCTZ 100-25 MG PO TABS: 1.0000 | ORAL_TABLET | Freq: Every day | ORAL | 1 refills | Status: DC

## 2018-01-06 MED ORDER — ROSUVASTATIN CALCIUM 10 MG PO TABS: 10.0000 mg | ORAL_TABLET | Freq: Every day | ORAL | 1 refills | Status: DC

## 2018-01-06 MED ORDER — CLOBETASOL PROPIONATE 0.05 % EX OINT: 1.0000 "application " | TOPICAL_OINTMENT | Freq: Two times a day (BID) | CUTANEOUS | 0 refills | Status: DC | PRN

## 2018-01-06 MED ORDER — NYSTATIN 100000 UNIT/GM EX CREA: 1.0000 "application " | TOPICAL_CREAM | Freq: Two times a day (BID) | CUTANEOUS | 2 refills | Status: DC

## 2018-01-06 NOTE — Progress Notes (Signed)
Name: Bonnie Rowland   MRN: [id number]    DOB: 1941/06/14   Date:01/06/2018       Progress Note  Subjective  Chief Complaint  Chief Complaint  Patient presents with  . Medication Refill    patient takes all meds as directed and requests a 90 day refill.  . Diabetes    seeing Endocrinologist-O'Connell, Justice Rocher, MD  AIC : 7.6. next appt on 03/13/18.  Marland Kitchen Hypertension    patient does not check it often. no neg sx  . Hyperlipidemia    patient has some swelling in the right leg but thinks it is related to her knee replacement. some achiness.  . Osteoporosis    patient goes to Dr. Precious Reel. had an infusion on 12/05/17.   . Vaginitis    patient is requesting a RX for Diflucan    HPI  Osteoporosis: she has been on Evista for many years, but bone density has dropped on right hip, -03%,she has seen Dr. Manfred Shirts and is now on Reclast on June 7th, 2019.   DM: seeing Endocrinologist - Dr. Manfred Shirts  up to date with eye exam, foot exam and hgbA1C done 10/2017 was 7.6% , continue current regiment. No polyphagia, polydipsia or polyuria. She is on Bydureon and has diarrhea and also has bloating.   HTN: at home is always good around 120's-70's, no chest pain, palpitation, no decrease in exercise tolerance. She denies dizziness.   Vaginal pain: she saw  Urologist for evaluation of supra pubic pain and studies have been negative, she states sometimes more common with yeast infection and would like a refill of diflucan  Atherosclerosis of aorta: on statin therapy and also aspirin.   Senile purpura: stable, but states a little more bruising because she is packing up to move to [facility].    Patient Active Problem List   Diagnosis Date Noted  . Age-related osteoporosis without current pathological fracture 11/05/2017  . 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([place]) 07/09/2017  . Atherosclerosis of abdominal aorta ([place]) 07/09/2017  . Bilateral carotid bruits 08/24/2015  . 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 Total  knee replacement status 03/30/2015  . Atrial flutter ([place]) 02/22/2015  . Chronic pain 01/10/2015  . 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 Fatigue 01/10/2015  . History of melanoma excision 01/10/2015  . Adiposity 01/10/2015  . 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 Spinal stenosis, lumbar region, 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  . DDD (degenerative disc disease), lumbar 11/14/2014  . Greater trochanteric bursitis of both hips 11/14/2014  . Piriformis syndrome 11/14/2014  . 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 Hyperlipemia 11/07/2009  . Hypertension, benign 11/07/2009  . Atherosclerosis of renal artery ([place]) 11/07/2009  . Hyperlipidemia due to type 2 diabetes mellitus ([place]) 11/07/2009  . Perennial allergic rhinitis 11/27/2007  . Lumbosacral neuritis 01/17/2007    Past Surgical History:  Procedure Laterality Date  . APPENDECTOMY    . CARDIAC CATHETERIZATION    . cataract surgery    . COLONOSCOPY    . COLONOSCOPY WITH PROPOFOL N/A 05/09/2016   Procedure: COLONOSCOPY WITH PROPOFOL;  Surgeon: Robert Bellow, MD;  Location: [facility] ENDOSCOPY;  Service: Endoscopy;  Laterality: N/A;  . DIAGNOSTIC MAMMOGRAM    . EYE SURGERY Bilateral    Cataract Extraction  . KNEE ARTHROPLASTY Right 03/30/2015   Procedure: COMPUTER ASSISTED TOTAL KNEE ARTHROPLASTY;  Surgeon: Dereck Leep, MD;  Location: [facility] ORS;  Service: Orthopedics;  Laterality: Right;  . KNEE  ARTHROSCOPY Right   . KNEE CLOSED REDUCTION Right 05/23/2015   Procedure: CLOSED MANIPULATION KNEE;  Surgeon: Dereck Leep, MD;  Location: [facility] ORS;  Service: Orthopedics;  Laterality: Right;  . TONSILLECTOMY      Family History  Problem Relation Age of Onset  . Diabetes Mother   . Hypertension Mother   . Cancer Mother   . Hypertension Father   . Cancer Father        Prostate  . Breast cancer Maternal Aunt 39  . Colon cancer Son   . Kidney cancer Neg Hx    . Bladder Cancer Neg Hx     Social History   Socioeconomic History  . Marital status: Married    Spouse name: Not on file  . Number of children: 1  . Years of education: Not on file  . Highest education level: Master's degree (e.g., MA, MS, MEng, MEd, MSW, MBA)  Occupational History  . Not on file  Social Needs  . Financial resource strain: Not hard at all  . Food insecurity:    Worry: Never true    Inability: Never true  . Transportation needs:    Medical: No    Non-medical: No  Tobacco Use  . Smoking status: Never Smoker  . Smokeless tobacco: Never Used  Substance and Sexual Activity  . Alcohol use: Yes    Alcohol/week: 0.0 oz    Comment: occ  . Drug use: No  . Sexual activity: Yes    Partners: Male  Lifestyle  . Physical activity:    Days per week: 3 days    Minutes per session: 40 min  . Stress: Not at all  Relationships  . Social connections:    Talks on phone: More than three times a week    Gets together: More than three times a week    Attends religious service: Never    Active member of club or organization: Yes    Attends meetings of clubs or organizations: More than 4 times per year    Relationship status: Married  . Intimate partner violence:    Fear of current or ex partner: No    Emotionally abused: No    Physically abused: No    Forced sexual activity: No  Other Topics Concern  . Not on file  Social History Narrative   She is married , only has one son and he was diagnosed with colon cancer at age 27.      Current Outpatient Medications:  .  ALFALFA PO, Take 3 tablets by mouth every morning., Disp: , Rfl:  .  aspirin 81 MG EC tablet, Take 81 mg by mouth at bedtime. , Disp: , Rfl:  .  B Complex-C (B-COMPLEX WITH VITAMIN C) tablet, Take 2 tablets by mouth at bedtime. , Disp: , Rfl:  .  BLACK ELDERBERRY,BERRY-FLOWER, PO, Take by mouth., Disp: , Rfl:  .  calcium carbonate (OS-CAL) 600 MG TABS tablet, Take 600 mg by mouth daily., Disp: , Rfl:   .  cholecalciferol (VITAMIN D) 1000 UNITS tablet, Take 4,000 Units by mouth daily. , Disp: , Rfl:  .  clobetasol ointment (TEMOVATE) 5.42 %, Apply 1 application topically 2 (two) times daily as needed (bug bites). Reported on 06/30/2015, Disp: 30 g, Rfl: 0 .  Coenzyme Q10 (COQ10) 200 MG CAPS, Take 1 tablet by mouth daily. 400 mg every am., Disp: , Rfl:  .  Continuous Blood Gluc Sensor ([place]) MISC, by Does not apply  route., Disp: , Rfl:  .  estradiol (ESTRACE VAGINAL) 0.1 MG/GM vaginal cream, 1/2 gm once weekly using applicator, apply blueberry sized amount of cream using tip of finger to urethra twice weekly, Disp: 50 g, Rfl: 4 .  Exenatide ER 2 MG SRER, Inject 2 mg into the skin once a week., Disp: 12 each, Rfl: 0 .  Eyelid Cleansers (AVENOVA) 0.01 % SOLN, See admin instructions., Disp: , Rfl: 3 .  fish oil-omega-3 fatty acids 1000 MG capsule, Take 2 g by mouth daily.  , Disp: , Rfl:  .  fluticasone (FLONASE) 50 MCG/ACT nasal spray, PLACE 2 SPRAYS INTO BOTH NOSTRILS DAILY., Disp: 8.5 g, Rfl: 2 .  GARLIC 8185 PO, Take 1 tablet by mouth daily. 1 gram, Disp: , Rfl:  .  Glucosamine 500 MG TABS, Take 1 tablet by mouth daily. , Disp: , Rfl:  .  glucose blood (ONE TOUCH ULTRA TEST) test strip, 1 each by Other route 3 (three) times daily. Use as instructed, Disp: 300 each, Rfl: 1 .  HUMALOG KWIKPEN 100 UNIT/ML KiwkPen, Inject 6 Units into the skin 3 (three) times daily after meals., Disp: , Rfl: 3 .  Insulin Glargine (LANTUS SOLOSTAR) 100 UNIT/ML Solostar Pen, Inject 24 Units into the skin every morning., Disp: 15 pen, Rfl: 2 .  Insulin Pen Needle 32G X 4 MM MISC, 1 pen by Does not apply route 2 (two) times daily. Use as directed 2 times daily, Disp: 200 each, Rfl: 2 .  Lancets (ONETOUCH ULTRASOFT) lancets, Use as instructed, Disp: 100 each, Rfl: 12 .  loratadine (CLARITIN) 10 MG tablet, Take 1 tablet (10 mg total) by mouth daily., Disp: 30 tablet, Rfl: 11 .  Mag Aspart-Potassium  Aspart (POTASSIUM & MAGNESIUM ASPARTAT PO), Take 1 tablet by mouth daily., Disp: , Rfl:  .  MAGNESIUM-POTASSIUM PO, Take 1 tablet by mouth 2 (two) times daily. Reported on 06/30/2015, Disp: , Rfl:  .  metoprolol succinate (TOPROL-XL) 25 MG 24 hr tablet, Take 1 tablet (25 mg total) by mouth daily., Disp: 90 tablet, Rfl: 3 .  Multiple Vitamins-Minerals (PRESERVISION AREDS 2) CAPS, Take 1 capsule by mouth 2 (two) times daily., Disp: , Rfl:  .  nystatin cream (MYCOSTATIN), Apply 1 application topically 2 (two) times daily., Disp: 30 g, Rfl: 2 .  Probiotic Product (PROBIOTIC PO), Take 1 Dose by mouth at bedtime., Disp: , Rfl:  .  Propylene Glycol (SYSTANE BALANCE OP), Apply 1 drop to eye as needed., Disp: , Rfl:  .  rosuvastatin (CRESTOR) 10 MG tablet, Take 1 tablet (10 mg total) by mouth daily., Disp: 90 tablet, Rfl: 1 .  sodium fluoride (DENTAGEL) 1.1 % GEL dental gel, , Disp: , Rfl:  .  sulfacetamide (BLEPH-10) 10 % ophthalmic solution, Place 1 drop into both eyes 3 (three) times daily as needed., Disp: , Rfl:  .  triamcinolone cream (KENALOG) 0.1 %, Apply 1 application topically 2 (two) times daily., Disp: 45 g, Rfl: 0 .  vitamin E (VITAMIN E) 400 UNIT capsule, Take 400 Units by mouth daily. Every am, Disp: , Rfl:  .  Zoledronic Acid (RECLAST IV), Inject into the vein., Disp: , Rfl:  .  fluconazole (DIFLUCAN) 150 MG tablet, Take 1 tablet (150 mg total) by mouth every other day., Disp: 3 tablet, Rfl: 0 .  losartan-hydrochlorothiazide (HYZAAR) 100-25 MG tablet, Take 1 tablet by mouth daily., Disp: 90 tablet, Rfl: 1  Allergies  Allergen Reactions  . Cyclobenzaprine Hypertension  . Cheese Other (See Comments)  bloating  . Ciprofloxacin Hcl Other (See Comments)    Muscle pain  . Coconut Oil   . Keflex [Cephalexin] Other (See Comments)    Patient prefers not to take this medication due to the side effects     ROS  Constitutional: Negative for fever or weight change.  Respiratory: Negative  for cough and shortness of breath.   Cardiovascular: Negative for chest pain or palpitations.  Gastrointestinal: Negative for abdominal pain, she has some bloating and diarrhea from medication and stable.  Musculoskeletal: Negative for gait problem or joint swelling.  Skin: positive  for rash.  Neurological: Negative for dizziness or headache.  No other specific complaints in a complete review of systems (except as listed in HPI above).  Objective  Vitals:   01/06/18 1015  BP: 132/64  Pulse: 70  Resp: 16  Temp: 98.6 F (37 C)  TempSrc: Oral  SpO2: 99%  Weight: 205 lb 1.6 oz (93 kg)  Height: 5\' 3"  (1.6 m)    Body mass index is 36.33 kg/m.  Physical Exam  Constitutional: Patient appears well-developed and well-nourished. Obese No distress.  HEENT: head atraumatic, normocephalic, pupils equal and reactive to light,  neck supple, throat within normal limits Cardiovascular: Normal rate, regular rhythm and normal heart sounds.  No murmur heard. 1 plus BLE edema. Pulmonary/Chest: Effort normal and breath sounds normal. No respiratory distress. Abdominal: Soft.  There is no tenderness. Skin: senile purpura on arms  Psychiatric: Patient has a normal mood and affect. behavior is normal. Judgment and thought content normal.  Recent Results (from the past 2160 hour(s))  Hemoglobin A1c     Status: None   Collection Time: 11/05/17 12:00 AM  Result Value Ref Range   Hemoglobin A1C 7.6     Comment: [facility]     PHQ2/9: Depression screen [facility] 2/9 01/06/2018 08/22/2017 04/08/2017 09/24/2016 08/21/2016  Decreased Interest 0 0 0 0 0  Down, Depressed, Hopeless 0 0 0 0 0  PHQ - 2 Score 0 0 0 0 0     Fall Risk: Fall Risk  01/06/2018 08/22/2017 07/09/2017 04/08/2017 12/17/2016  Falls in the past year? No No No No No  Comment - - - - -  Risk for fall due to : - - - Impaired mobility -  Risk for fall due to: Comment - - - pain in R knee -     Functional Status Survey: Is the patient  deaf or have difficulty hearing?: No Does the patient have difficulty seeing, even when wearing glasses/contacts?: No Does the patient have difficulty concentrating, remembering, or making decisions?: No Does the patient have difficulty walking or climbing stairs?: No Does the patient have difficulty dressing or bathing?: No Does the patient have difficulty doing errands alone such as visiting a doctor's office or shopping?: No    Assessment & Plan  1. Dyslipidemia associated with type 2 diabetes mellitus (HCC)  - rosuvastatin (CRESTOR) 10 MG tablet; Take 1 tablet (10 mg total) by mouth daily.  Dispense: 90 tablet; Refill: 1  2. Atherosclerosis of renal artery (HCC)  - rosuvastatin (CRESTOR) 10 MG tablet; Take 1 tablet (10 mg total) by mouth daily.  Dispense: 90 tablet; Refill: 1  3. Candida infection of flexural skin  - nystatin cream (MYCOSTATIN); Apply 1 application topically 2 (two) times daily.  Dispense: 30 g; Refill: 2 - fluconazole (DIFLUCAN) 150 MG tablet; Take 1 tablet (150 mg total) by mouth every other day.  Dispense: 3 tablet; Refill: 0  4.  Dyslipidemia   5. Osteoporosis, post-menopausal  Continue reclast   6. Essential hypertension  - losartan-hydrochlorothiazide (HYZAAR) 100-25 MG tablet; Take 1 tablet by mouth daily.  Dispense: 90 tablet; Refill: 1  7. 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([place])   8.Bug bite, subsequent encounter  - clobetasol ointment (TEMOVATE) 0.05 %; Apply 1 application topically 2 (two) times daily as needed (bug bites). Reported on 06/30/2015  Dispense: 30 g; Refill: 0

## 2018-01-09 DIAGNOSIS — M5033 Other cervical disc degeneration, cervicothoracic region: Secondary | ICD-10-CM | POA: Diagnosis not present

## 2018-01-09 DIAGNOSIS — M9903 Segmental and somatic dysfunction of lumbar region: Secondary | ICD-10-CM | POA: Diagnosis not present

## 2018-01-09 DIAGNOSIS — M9901 Segmental and somatic dysfunction of cervical region: Secondary | ICD-10-CM | POA: Diagnosis not present

## 2018-01-09 DIAGNOSIS — M5416 Radiculopathy, lumbar region: Secondary | ICD-10-CM | POA: Diagnosis not present

## 2018-01-13 DIAGNOSIS — H3554 Dystrophies primarily involving the retinal pigment epithelium: Secondary | ICD-10-CM | POA: Diagnosis not present

## 2018-01-13 LAB — HM DIABETES EYE EXAM

## 2018-01-14 ENCOUNTER — Encounter: Payer: Self-pay | Admitting: Family Medicine

## 2018-01-15 DIAGNOSIS — M9901 Segmental and somatic dysfunction of cervical region: Secondary | ICD-10-CM | POA: Diagnosis not present

## 2018-01-15 DIAGNOSIS — M5033 Other cervical disc degeneration, cervicothoracic region: Secondary | ICD-10-CM | POA: Diagnosis not present

## 2018-01-15 DIAGNOSIS — M9903 Segmental and somatic dysfunction of lumbar region: Secondary | ICD-10-CM | POA: Diagnosis not present

## 2018-01-15 DIAGNOSIS — M5416 Radiculopathy, lumbar region: Secondary | ICD-10-CM | POA: Diagnosis not present

## 2018-01-23 DIAGNOSIS — M5033 Other cervical disc degeneration, cervicothoracic region: Secondary | ICD-10-CM | POA: Diagnosis not present

## 2018-01-23 DIAGNOSIS — M5416 Radiculopathy, lumbar region: Secondary | ICD-10-CM | POA: Diagnosis not present

## 2018-01-23 DIAGNOSIS — M9903 Segmental and somatic dysfunction of lumbar region: Secondary | ICD-10-CM | POA: Diagnosis not present

## 2018-01-23 DIAGNOSIS — M9901 Segmental and somatic dysfunction of cervical region: Secondary | ICD-10-CM | POA: Diagnosis not present

## 2018-01-30 DIAGNOSIS — M5416 Radiculopathy, lumbar region: Secondary | ICD-10-CM | POA: Diagnosis not present

## 2018-01-30 DIAGNOSIS — M5033 Other cervical disc degeneration, cervicothoracic region: Secondary | ICD-10-CM | POA: Diagnosis not present

## 2018-01-30 DIAGNOSIS — M9901 Segmental and somatic dysfunction of cervical region: Secondary | ICD-10-CM | POA: Diagnosis not present

## 2018-01-30 DIAGNOSIS — M9903 Segmental and somatic dysfunction of lumbar region: Secondary | ICD-10-CM | POA: Diagnosis not present

## 2018-02-06 DIAGNOSIS — M5033 Other cervical disc degeneration, cervicothoracic region: Secondary | ICD-10-CM | POA: Diagnosis not present

## 2018-02-06 DIAGNOSIS — M9903 Segmental and somatic dysfunction of lumbar region: Secondary | ICD-10-CM | POA: Diagnosis not present

## 2018-02-06 DIAGNOSIS — M5416 Radiculopathy, lumbar region: Secondary | ICD-10-CM | POA: Diagnosis not present

## 2018-02-06 DIAGNOSIS — M9901 Segmental and somatic dysfunction of cervical region: Secondary | ICD-10-CM | POA: Diagnosis not present

## 2018-02-13 DIAGNOSIS — M5033 Other cervical disc degeneration, cervicothoracic region: Secondary | ICD-10-CM | POA: Diagnosis not present

## 2018-02-13 DIAGNOSIS — M5416 Radiculopathy, lumbar region: Secondary | ICD-10-CM | POA: Diagnosis not present

## 2018-02-13 DIAGNOSIS — M9903 Segmental and somatic dysfunction of lumbar region: Secondary | ICD-10-CM | POA: Diagnosis not present

## 2018-02-13 DIAGNOSIS — M9901 Segmental and somatic dysfunction of cervical region: Secondary | ICD-10-CM | POA: Diagnosis not present

## 2018-02-20 DIAGNOSIS — M5416 Radiculopathy, lumbar region: Secondary | ICD-10-CM | POA: Diagnosis not present

## 2018-02-20 DIAGNOSIS — M9903 Segmental and somatic dysfunction of lumbar region: Secondary | ICD-10-CM | POA: Diagnosis not present

## 2018-02-20 DIAGNOSIS — M9901 Segmental and somatic dysfunction of cervical region: Secondary | ICD-10-CM | POA: Diagnosis not present

## 2018-02-20 DIAGNOSIS — M5033 Other cervical disc degeneration, cervicothoracic region: Secondary | ICD-10-CM | POA: Diagnosis not present

## 2018-02-27 DIAGNOSIS — M9901 Segmental and somatic dysfunction of cervical region: Secondary | ICD-10-CM | POA: Diagnosis not present

## 2018-02-27 DIAGNOSIS — M5416 Radiculopathy, lumbar region: Secondary | ICD-10-CM | POA: Diagnosis not present

## 2018-02-27 DIAGNOSIS — M5033 Other cervical disc degeneration, cervicothoracic region: Secondary | ICD-10-CM | POA: Diagnosis not present

## 2018-02-27 DIAGNOSIS — M9903 Segmental and somatic dysfunction of lumbar region: Secondary | ICD-10-CM | POA: Diagnosis not present

## 2018-03-05 ENCOUNTER — Other Ambulatory Visit: Payer: Self-pay | Admitting: Family Medicine

## 2018-03-05 DIAGNOSIS — E113299 Type 2 diabetes mellitus with mild nonproliferative diabetic retinopathy without macular edema, unspecified eye: Secondary | ICD-10-CM

## 2018-03-05 DIAGNOSIS — Z794 Long term (current) use of insulin: Principal | ICD-10-CM

## 2018-03-05 DIAGNOSIS — IMO0002 Reserved for concepts with insufficient information to code with codable children: Secondary | ICD-10-CM

## 2018-03-05 DIAGNOSIS — E1165 Type 2 diabetes mellitus with hyperglycemia: Principal | ICD-10-CM

## 2018-03-06 DIAGNOSIS — M5033 Other cervical disc degeneration, cervicothoracic region: Secondary | ICD-10-CM | POA: Diagnosis not present

## 2018-03-06 DIAGNOSIS — M5416 Radiculopathy, lumbar region: Secondary | ICD-10-CM | POA: Diagnosis not present

## 2018-03-06 DIAGNOSIS — M9903 Segmental and somatic dysfunction of lumbar region: Secondary | ICD-10-CM | POA: Diagnosis not present

## 2018-03-06 DIAGNOSIS — M9901 Segmental and somatic dysfunction of cervical region: Secondary | ICD-10-CM | POA: Diagnosis not present

## 2018-03-13 DIAGNOSIS — M810 Age-related osteoporosis without current pathological fracture: Secondary | ICD-10-CM | POA: Diagnosis not present

## 2018-03-13 DIAGNOSIS — M9901 Segmental and somatic dysfunction of cervical region: Secondary | ICD-10-CM | POA: Diagnosis not present

## 2018-03-13 DIAGNOSIS — E559 Vitamin D deficiency, unspecified: Secondary | ICD-10-CM | POA: Diagnosis not present

## 2018-03-13 DIAGNOSIS — M5416 Radiculopathy, lumbar region: Secondary | ICD-10-CM | POA: Diagnosis not present

## 2018-03-13 DIAGNOSIS — M5033 Other cervical disc degeneration, cervicothoracic region: Secondary | ICD-10-CM | POA: Diagnosis not present

## 2018-03-13 DIAGNOSIS — Z794 Long term (current) use of insulin: Secondary | ICD-10-CM | POA: Diagnosis not present

## 2018-03-13 DIAGNOSIS — E1165 Type 2 diabetes mellitus with hyperglycemia: Secondary | ICD-10-CM | POA: Diagnosis not present

## 2018-03-13 DIAGNOSIS — M9903 Segmental and somatic dysfunction of lumbar region: Secondary | ICD-10-CM | POA: Diagnosis not present

## 2018-03-13 LAB — HEMOGLOBIN A1C: Hemoglobin A1C: 7.2

## 2018-03-20 DIAGNOSIS — M9903 Segmental and somatic dysfunction of lumbar region: Secondary | ICD-10-CM | POA: Diagnosis not present

## 2018-03-20 DIAGNOSIS — M9901 Segmental and somatic dysfunction of cervical region: Secondary | ICD-10-CM | POA: Diagnosis not present

## 2018-03-20 DIAGNOSIS — M5416 Radiculopathy, lumbar region: Secondary | ICD-10-CM | POA: Diagnosis not present

## 2018-03-20 DIAGNOSIS — M5033 Other cervical disc degeneration, cervicothoracic region: Secondary | ICD-10-CM | POA: Diagnosis not present

## 2018-03-27 DIAGNOSIS — M5416 Radiculopathy, lumbar region: Secondary | ICD-10-CM | POA: Diagnosis not present

## 2018-03-27 DIAGNOSIS — M9903 Segmental and somatic dysfunction of lumbar region: Secondary | ICD-10-CM | POA: Diagnosis not present

## 2018-03-27 DIAGNOSIS — M5033 Other cervical disc degeneration, cervicothoracic region: Secondary | ICD-10-CM | POA: Diagnosis not present

## 2018-03-27 DIAGNOSIS — M9901 Segmental and somatic dysfunction of cervical region: Secondary | ICD-10-CM | POA: Diagnosis not present

## 2018-04-03 DIAGNOSIS — M9901 Segmental and somatic dysfunction of cervical region: Secondary | ICD-10-CM | POA: Diagnosis not present

## 2018-04-03 DIAGNOSIS — M5416 Radiculopathy, lumbar region: Secondary | ICD-10-CM | POA: Diagnosis not present

## 2018-04-03 DIAGNOSIS — M5033 Other cervical disc degeneration, cervicothoracic region: Secondary | ICD-10-CM | POA: Diagnosis not present

## 2018-04-03 DIAGNOSIS — M9903 Segmental and somatic dysfunction of lumbar region: Secondary | ICD-10-CM | POA: Diagnosis not present

## 2018-04-07 ENCOUNTER — Ambulatory Visit (INDEPENDENT_AMBULATORY_CARE_PROVIDER_SITE_OTHER): Payer: Medicare Other

## 2018-04-07 DIAGNOSIS — Z23 Encounter for immunization: Secondary | ICD-10-CM | POA: Diagnosis not present

## 2018-04-07 NOTE — Progress Notes (Signed)
f °

## 2018-04-10 DIAGNOSIS — M5033 Other cervical disc degeneration, cervicothoracic region: Secondary | ICD-10-CM | POA: Diagnosis not present

## 2018-04-10 DIAGNOSIS — M9903 Segmental and somatic dysfunction of lumbar region: Secondary | ICD-10-CM | POA: Diagnosis not present

## 2018-04-10 DIAGNOSIS — M9901 Segmental and somatic dysfunction of cervical region: Secondary | ICD-10-CM | POA: Diagnosis not present

## 2018-04-10 DIAGNOSIS — M5416 Radiculopathy, lumbar region: Secondary | ICD-10-CM | POA: Diagnosis not present

## 2018-04-11 ENCOUNTER — Encounter: Payer: Self-pay | Admitting: Nurse Practitioner

## 2018-04-11 ENCOUNTER — Ambulatory Visit (INDEPENDENT_AMBULATORY_CARE_PROVIDER_SITE_OTHER): Payer: Medicare Other | Admitting: Nurse Practitioner

## 2018-04-11 VITALS — BP 116/68 | HR 66 | Temp 98.0°F | Resp 16 | Ht 63.0 in | Wt 204.1 lb

## 2018-04-11 DIAGNOSIS — Z0000 Encounter for general adult medical examination without abnormal findings: Secondary | ICD-10-CM

## 2018-04-11 DIAGNOSIS — Z7189 Other specified counseling: Secondary | ICD-10-CM

## 2018-04-11 DIAGNOSIS — Z5181 Encounter for therapeutic drug level monitoring: Secondary | ICD-10-CM | POA: Diagnosis not present

## 2018-04-11 DIAGNOSIS — R5383 Other fatigue: Secondary | ICD-10-CM

## 2018-04-11 DIAGNOSIS — Z66 Do not resuscitate: Secondary | ICD-10-CM

## 2018-04-11 DIAGNOSIS — I10 Essential (primary) hypertension: Secondary | ICD-10-CM | POA: Diagnosis not present

## 2018-04-11 DIAGNOSIS — E785 Hyperlipidemia, unspecified: Secondary | ICD-10-CM

## 2018-04-11 DIAGNOSIS — E1169 Type 2 diabetes mellitus with other specified complication: Secondary | ICD-10-CM

## 2018-04-11 LAB — COMPLETE METABOLIC PANEL WITH GFR
AG Ratio: 1.7 (calc) (ref 1.0–2.5)
ALBUMIN MSPROF: 3.9 g/dL (ref 3.6–5.1)
ALT: 16 U/L (ref 6–29)
AST: 20 U/L (ref 10–35)
Alkaline phosphatase (APISO): 79 U/L (ref 33–130)
BUN: 13 mg/dL (ref 7–25)
CALCIUM: 9.3 mg/dL (ref 8.6–10.4)
CO2: 31 mmol/L (ref 20–32)
Chloride: 100 mmol/L (ref 98–110)
Creat: 0.63 mg/dL (ref 0.60–0.93)
GFR, EST AFRICAN AMERICAN: 102 mL/min/{1.73_m2} (ref >=60)
GFR, EST NON AFRICAN AMERICAN: 88 mL/min/{1.73_m2} (ref >=60)
GLUCOSE: 138 mg/dL — AB (ref 65–99)
Globulin: 2.3 g/dL (calc) (ref 1.9–3.7)
Potassium: 3.9 mmol/L (ref 3.5–5.3)
Sodium: 139 mmol/L (ref 135–146)
TOTAL PROTEIN: 6.2 g/dL (ref 6.1–8.1)
Total Bilirubin: 0.7 mg/dL (ref 0.2–1.2)

## 2018-04-11 LAB — LIPID PANEL
CHOLESTEROL: 158 mg/dL (ref <200)
HDL: 60 mg/dL (ref >50)
LDL Cholesterol (Calc): 82 mg/dL (calc)
NON-HDL CHOLESTEROL (CALC): 98 mg/dL (ref <130)
TRIGLYCERIDES: 78 mg/dL (ref <150)
Total CHOL/HDL Ratio: 2.6 (calc) (ref <5.0)

## 2018-04-11 LAB — CBC
HCT: 40 % (ref 35.0–45.0)
Hemoglobin: 13.5 g/dL (ref 11.7–15.5)
MCH: 31.3 pg (ref 27.0–33.0)
MCHC: 33.8 g/dL (ref 32.0–36.0)
MCV: 92.8 fL (ref 80.0–100.0)
MPV: 11.3 fL (ref 7.5–12.5)
PLATELETS: 228 10*3/uL (ref 140–400)
RBC: 4.31 10*6/uL (ref 3.80–5.10)
RDW: 11.9 % (ref 11.0–15.0)
WBC: 5.6 10*3/uL (ref 3.8–10.8)

## 2018-04-11 LAB — TSH: TSH: 2.82 mIU/L (ref 0.40–4.50)

## 2018-04-11 NOTE — Progress Notes (Signed)
Patient: Bonnie Rowland, Female    DOB: [birth year], 77 y.o.   MRN: [id number]  Visit Date: 04/11/2018  Today's Provider: Fredderick Severance, NP   Chief Complaint  Patient presents with  . Medicare Wellness    Subjective:    HPI Bonnie Rowland is a 77 y.o. female who presents today for her Subsequent Annual Wellness Visit.  Patient input:  Patient states she has been good overall. Diet has improved- cut down on sweets, A1C has decreased from 7.6 to 7.2 last month- follows up with endocrinology. Uses freestyle libre with average sugar of 150's. Had 2 sessions with dietician last year and worked on diet. Plans to loose 10 pounds over the next year.   Wt Readings from Last 3 Encounters:  04/11/18 204 lb 1.6 oz (92.6 kg)  01/06/18 205 lb 1.6 oz (93 kg)  12/16/17 206 lb 6.4 oz (93.6 kg)     Past Medical History:  Diagnosis Date  . Acute cystitis   . Allergic rhinitis, cause unspecified   . 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 Bronchitis, not specified as acute or chronic   . 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 Cancer ([place]) 05/2014   basal cell removed left temple  . Cellulitis and abscess of leg, except foot   . Conjunctivitis unspecified   . Dermatophytosis of nail   . Diabetes mellitus    type II  . Esophageal reflux   . Hyperlipidemia   . Hypertension   . 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 Renal artery stenosis ([place])   . Sprain of lumbar region   . Thoracic or lumbosacral neuritis or radiculitis, unspecified   . Urinary tract infection, site not specified     Past Surgical History:  Procedure Laterality Date  . APPENDECTOMY    . CARDIAC CATHETERIZATION    . cataract surgery    . COLONOSCOPY    . COLONOSCOPY WITH PROPOFOL N/A 05/09/2016   Procedure: COLONOSCOPY WITH PROPOFOL;  Surgeon: Robert Bellow, MD;  Location: [facility] ENDOSCOPY;  Service: Endoscopy;  Laterality: N/A;  . DIAGNOSTIC MAMMOGRAM    . EYE SURGERY  Bilateral    Cataract Extraction  . KNEE ARTHROPLASTY Right 03/30/2015   Procedure: COMPUTER ASSISTED TOTAL KNEE ARTHROPLASTY;  Surgeon: Dereck Leep, MD;  Location: [facility] ORS;  Service: Orthopedics;  Laterality: Right;  . KNEE ARTHROSCOPY Right   . KNEE CLOSED REDUCTION Right 05/23/2015   Procedure: CLOSED MANIPULATION KNEE;  Surgeon: Dereck Leep, MD;  Location: [facility] ORS;  Service: Orthopedics;  Laterality: Right;  . TONSILLECTOMY      Family History  Problem Relation Age of Onset  . Diabetes Mother   . Hypertension Mother   . Cancer Mother   . Hypertension Father   . Cancer Father        Prostate  . Breast cancer Maternal Aunt 76  . Colon cancer Son   . Kidney cancer Neg Hx   . Bladder Cancer Neg Hx     Social History   Socioeconomic History  . Marital status: Married    Spouse name: Emergency planning/management officer  . Number of children: 1  . Years of education: Not on file  . Highest education level: Master's degree (e.g., MA, MS, MEng, MEd, MSW, MBA)  Occupational History  . Occupation: Retired  Scientific laboratory technician  . Financial resource strain: Not hard at all  . Food insecurity:    Worry: Never true  Inability: Never true  . Transportation needs:    Medical: No    Non-medical: No  Tobacco Use  . Smoking status: Never Smoker  . Smokeless tobacco: Never Used  Substance and Sexual Activity  . Alcohol use: Not Currently    Alcohol/week: 0.0 standard drinks  . Drug use: No  . Sexual activity: Yes    Partners: Male  Lifestyle  . Physical activity:    Days per week: 3 days    Minutes per session: 40 min  . Stress: Not at all  Relationships  . Social connections:    Talks on phone: More than three times a week    Gets together: More than three times a week    Attends religious service: Never    Active member of club or organization: Yes    Attends meetings of clubs or organizations: More than 4 times per year    Relationship status: Married  . Intimate partner violence:    Fear of  current or ex partner: No    Emotionally abused: No    Physically abused: No    Forced sexual activity: No  Other Topics Concern  . Not on file  Social History Narrative   She is married , only has one son and he was diagnosed with colon cancer at age 11.     Outpatient Encounter Medications as of 04/11/2018  Medication Sig Note  . ALFALFA PO Take 3 tablets by mouth every morning.   Marland Kitchen aspirin 81 MG EC tablet Take 81 mg by mouth at bedtime.  02/22/2015: Holding due to surgery.  . B Complex-C (B-COMPLEX WITH VITAMIN C) tablet Take 2 tablets by mouth at bedtime.    . BD PEN NEEDLE NANO U/F 32G X 4 MM MISC USE 4 TIMES DAILY (HUMALOG 3 TIMES DAILY AND LANTUS ONCE DAILY) OFFICE NOTIFIED 08/06/17   . BLACK ELDERBERRY,BERRY-FLOWER, PO Take by mouth.   . calcium carbonate (OS-CAL) 600 MG TABS tablet Take 600 mg by mouth daily.   . cholecalciferol (VITAMIN D) 1000 UNITS tablet Take 4,000 Units by mouth daily.    . clobetasol ointment (TEMOVATE) 3.81 % Apply 1 application topically 2 (two) times daily as needed (bug bites). Reported on 06/30/2015   . Coenzyme Q10 (COQ10) 200 MG CAPS Take 1 tablet by mouth daily. 400 mg every am.   . Coenzyme Q10-Vitamin E 100-100 MG-UNIT CAPS Take by mouth.   . Continuous Blood Gluc Sensor ([place]) MISC by Does not apply route.   Marland Kitchen estradiol (ESTRACE VAGINAL) 0.1 MG/GM vaginal cream 1/2 gm once weekly using applicator, apply blueberry sized amount of cream using tip of finger to urethra twice weekly   . Exenatide ER 2 MG SRER Inject 2 mg into the skin once a week.   . Eyelid Cleansers (AVENOVA) 0.01 % SOLN See admin instructions. 01/16/2016: Received from: External Pharmacy  . fish oil-omega-3 fatty acids 1000 MG capsule Take 2 g by mouth daily.     . fluconazole (DIFLUCAN) 150 MG tablet Take 1 tablet (150 mg total) by mouth every other day.   . fluticasone (FLONASE) 50 MCG/ACT nasal spray PLACE 2 SPRAYS INTO BOTH NOSTRILS DAILY.   Marland Kitchen GARLIC 0175  PO Take 1 tablet by mouth daily. 1 gram   . Glucosamine 500 MG TABS Take 1 tablet by mouth daily.    Marland Kitchen HUMALOG KWIKPEN 100 UNIT/ML KiwkPen Inject 6 Units into the skin 3 (three) times daily after meals.   . Insulin  Glargine (LANTUS SOLOSTAR) 100 UNIT/ML Solostar Pen Inject 24 Units into the skin every morning.   Marland Kitchen losartan-hydrochlorothiazide (HYZAAR) 100-25 MG tablet Take 1 tablet by mouth daily.   . Mag Aspart-Potassium Aspart (POTASSIUM & MAGNESIUM ASPARTAT PO) Take 1 tablet by mouth daily.   . metoprolol succinate (TOPROL-XL) 25 MG 24 hr tablet Take 1 tablet (25 mg total) by mouth daily.   . Multiple Vitamins-Minerals (PRESERVISION AREDS 2) CAPS Take 1 capsule by mouth 2 (two) times daily.   Marland Kitchen nystatin cream (MYCOSTATIN) Apply 1 application topically 2 (two) times daily.   . Probiotic Product (PROBIOTIC PO) Take 1 Dose by mouth at bedtime.   Marland Kitchen Propylene Glycol (SYSTANE BALANCE OP) Apply 1 drop to eye as needed.   . rosuvastatin (CRESTOR) 10 MG tablet Take 1 tablet (10 mg total) by mouth daily.   . sodium fluoride (DENTAGEL) 1.1 % GEL dental gel  12/12/2015: Received from: [facility]  . sulfacetamide (BLEPH-10) 10 % ophthalmic solution Place 1 drop into both eyes 3 (three) times daily as needed.   . triamcinolone cream (KENALOG) 0.1 % Apply 1 application topically 2 (two) times daily.   . vitamin E (VITAMIN E) 400 UNIT capsule Take 400 Units by mouth daily. Every am   . Zoledronic Acid (RECLAST IV) Inject into the vein. Every year 12/16/2017: Annual infusion June 6  . [DISCONTINUED] Glucosamine HCl 500 MG TABS Take by mouth.   . [DISCONTINUED] glucose blood (ONE TOUCH ULTRA TEST) test strip 1 each by Other route 3 (three) times daily. Use as instructed   . [DISCONTINUED] Lancets (ONETOUCH ULTRASOFT) lancets Use as instructed   . [DISCONTINUED] loratadine (CLARITIN) 10 MG tablet Take 1 tablet (10 mg total) by mouth daily. (Patient not taking: Reported on 04/11/2018)   .  [DISCONTINUED] MAGNESIUM-POTASSIUM PO Take 1 tablet by mouth 2 (two) times daily. Reported on 06/30/2015    No facility-administered encounter medications on file as of 04/11/2018.     Allergies  Allergen Reactions  . Cyclobenzaprine Hypertension  . Cheese Other (See Comments)    bloating  . Ciprofloxacin Hcl Other (See Comments)    Muscle pain  . Coconut Oil   . Keflex [Cephalexin] Other (See Comments)    Patient prefers not to take this medication due to the side effects    Care Team Updated in EHR: Yes    Objective:   Vitals: BP 116/68 (BP Location: Left Arm, Patient Position: Sitting, Cuff Size: Large)   Pulse 66   Temp 98 F (36.7 C) (Oral)   Resp 16   Ht 5\' 3"  (1.6 m)   Wt 204 lb 1.6 oz (92.6 kg)   SpO2 97%   BMI 36.15 kg/m  Body mass index is 36.15 kg/m.  No exam data present  Diabetic Foot Exam - Simple   Simple Foot Form Diabetic Foot exam was performed with the following findings:  Yes 04/11/2018 10:15 AM  Visual Inspection No deformities, no ulcerations, no other skin breakdown bilaterally:  Yes Sensation Testing Intact to touch and monofilament testing bilaterally:  Yes Pulse Check Posterior Tibialis and Dorsalis pulse intact bilaterally:  Yes Comments     Fall Risk: Fall Risk  04/11/2018 01/06/2018 08/22/2017 07/09/2017 04/08/2017  Falls in the past year? No No No No No  Comment - - - - -  Risk for fall due to : - - - - Impaired mobility  Risk for fall due to: Comment - - - - pain in R knee  6CIT Screen 04/11/2018 04/08/2017  What Year? 0 points 0 points  What month? 0 points 0 points  What time? 0 points 0 points  Count back from 20 0 points 0 points  Months in reverse 0 points 0 points  Repeat phrase 0 points 0 points  Total Score 0 0     FALL RISK PREVENTION PERTAINING TO THE HOME:  Any stairs in or around the home WITH handrails? Yes  Home free of loose throw rugs in walkways, pet beds, electrical cords, etc? has throw rugs, most have  rubber slip to prevent movement except in the bathroom Adequate lighting in your home to reduce risk of falls? Yes   ASSISTIVE DEVICES UTILIZED TO PREVENT FALLS:  Life alert? No  Use of a cane, walker or w/c? No  Grab bars in the bathroom? Yes  Shower chair or bench in shower? No  Elevated toilet seat or a handicapped toilet? Yes   DME ORDERS:  DME order needed?  No   TIMED UP AND GO:  Was the test performed? Yes .  Length of time to ambulate 10 feet: 20 sec.   GAIT:  Appearance of gait: Gait stead-fast and without the use of an assistive device.Education: Fall risk prevention has been discussed.  Intervention(s) required? No   Yes  Zostavax and Shingrix completed.   Tdap: up to date; 2023.   Flu Vaccine: completed 04/07/2018  Pneumococcal Vaccine: completed course of 13 and 23.    Depression Screen Depression screen [facility] 2/9 04/11/2018 01/06/2018 08/22/2017 04/08/2017 09/24/2016  Decreased Interest 0 0 0 0 0  Down, Depressed, Hopeless 0 0 0 0 0  PHQ - 2 Score 0 0 0 0 0    Cancer Screenings:  Colorectal Screening: Completed 2017. Repeat every 10 years.  Mammogram: Completed 08/13/2017. Repeat every year.  Bone Density: Completed 08/13/2017. Results reflect , OSTEOPENIA- forearm and femur, OSTEOPOROSIS- femoral neck. Repeat every 2 years. Is getting reclast injections; Vitamin D and Calcium supplementatoin.   Lung Cancer Screening: (Low Dose CT Chest recommended if Age 58-80 years, 30 pack-year currently smoking OR have quit w/in 15years.) does not qualify.     Additional Screening:  Hepatitis C Screening: does not qualify;   Vision Screening: has biannual ophthalmology exams for early detection of glaucoma and other disorders of the eye. Is the patient up to date with their annual eye exam?  Yes  Who is the provider or what is the name of the office in which the pt attends annual eye exams? [facility]   Last Hearing Exam: Wears Hearing Aids: Yes sees ENT  twice a year   Dental Screening: Has biannual dental exams for proper oral hygiene  Community Resource Referral:  CRR required this visit?  No   Advanced Care Planning: A voluntary discussion about advance care planning including the explanation and discussion of advance directives.  Discussed health care proxy and Living will, and the patient was able to identify a health care proxy as Husband, Bonnie Rowland.  Patient does have a living will at present time. If patient does have living will, I have requested they bring this to the clinic to be scanned in to their chart. Does patient have a HCPOA?    yes If yes, name and contact information:  Does patient have a living will or MOST form?  yes MOST form completed today  Assessment & Plan:    1. Encounter for Medicare annual wellness exam I have personally reviewed and addressed  the Medicare Annual Wellness health risk assessment questionnaire and have noted the following in the patient's chart:  A.         Medical and social history & family history B.         Use of alcohol, tobacco, and illicit drugs  C.         Current medications and supplements D.         Functional and Cognitive ability and status E.         Nutritional status F.         Physical activity G.        Advance directives H.         List of other physicians I.          Hospitalizations, surgeries, and ER visits in previous 12 months J.         [place] such as hearing, vision, cognitive function, and depression L.         Referrals and appointments:   In addition, I have reviewed and discussed with patient certain preventive protocols, quality metrics, and best practice recommendations. A written personalized care plan for preventive services as well as general preventive health recommendations were provided to patient.   See attached scanned questionnaire for additional information.   2. Dyslipidemia associated with type 2 diabetes mellitus  (HCC) Continue medications  - Lipid Profile  3. Essential hypertension Well controlled, continue treatment  - COMPLETE METABOLIC PANEL WITH GFR  4. Fatigue, unspecified type Baseline requesting thyroid be checked - COMPLETE METABOLIC PANEL WITH GFR - TSH  5. Medication monitoring encounter - COMPLETE METABOLIC PANEL WITH GFR - CBC - TSH  6. DNR (do not resuscitate) Discussed with patient and form filled out by NP and signed by MD in patients presence, copy made and original given to patient   7. Counseling regarding end of life decision making Had lengthy discussion and MOST form completed, copy made and originals given to patient.    Exercise Activities and Dietary recommendations Goals    . Reduce sugar intake to X grams per day     Recommend to eliminate carbs and sweets from diet    . Weight (lb) < 200 lb (90.7 kg)     Goal is to loose 10 pounds by next year.  Wt Readings from Last 3 Encounters:  04/11/18 204 lb 1.6 oz (92.6 kg)  01/06/18 205 lb 1.6 oz (93 kg)  12/16/17 206 lb 6.4 oz (93.6 kg)         - Discussed health benefits of physical activity, and encouraged her to engage in regular exercise appropriate for her age and condition.     Return in about 1 year (around 04/12/2019) for [place].

## 2018-04-11 NOTE — Patient Instructions (Signed)
Bonnie Rowland , Thank you for taking time to come for your Medicare Wellness Visit. I appreciate your ongoing commitment to your health goals. Please review the following plan we discussed and let me know if I can assist you in the future.   Screening recommendations/referrals: Mammogram: 08/13/2018 Bone Density: 08/14/2019  Vision and Dental Exams: Recommended annual ophthalmology exams for early detection of glaucoma and other disorders of the eye Recommended annual dental exams for proper oral hygiene  Diabetic Exams: Diabetic Eye Exam: every 6 montjs Diabetic Foot Exam: every year,or sooner if needed   Vaccinations: Influenza vaccine: up to date, annually  Pneumococcal vaccine: completed the course  Tdap vaccine: due 2023; if you get a cut it can covered.   Advanced directives: We have received a copy of your POA (Power of [place]) and/or Living Will. These documents can be located in your chart.   Goals:  Recommended cutting down on sweets  Recommended to lose 10 pounds (goal 194 pounds)   Next appointment: Please schedule your Annual Wellness Visit with your Nurse Health Advisor in one year.  Preventive Care 13 Years and Older, Female Preventive care refers to lifestyle choices and visits with your health care provider that can promote health and wellness. What does preventive care include?  A yearly physical exam. This is also called an annual well check.  Dental exams once or twice a year.  Routine eye exams. Ask your health care provider how often you should have your eyes checked.  Personal lifestyle choices, including:  Daily care of your teeth and gums.  Regular physical activity.  Eating a healthy diet.  Avoiding tobacco and drug use.  Limiting alcohol use.  Practicing safe sex.  Taking low-dose aspirin every day if recommended by your health care provider.  Taking vitamin and mineral supplements as recommended by your health care provider. What  happens during an annual well check? The services and screenings done by your health care provider during your annual well check will depend on your age, overall health, lifestyle risk factors, and family history of disease. Counseling  Your health care provider may ask you questions about your:  Alcohol use.  Tobacco use.  Drug use.  Emotional well-being.  Home and relationship well-being.  Sexual activity.  Eating habits.  History of falls.  Memory and ability to understand (cognition).  Work and work Statistician.  Reproductive health. Screening  You may have the following tests or measurements:  Height, weight, and BMI.  Blood pressure.  Lipid and cholesterol levels. These may be checked every 5 years, or more frequently if you are over 5 years old.  Skin check.  Lung cancer screening. You may have this screening every year starting at age 38 if you have a 30-pack-year history of smoking and currently smoke or have quit within the past 15 years.  Fecal occult blood test (FOBT) of the stool. You may have this test every year starting at age 57.  Flexible sigmoidoscopy or colonoscopy. You may have a sigmoidoscopy every 5 years or a colonoscopy every 10 years starting at age 57.  Sexually transmitted disease (STD) testing.  Diabetes screening. This is done by checking your blood sugar (glucose) after you have not eaten for a while (fasting). You may have this done every 1-3 years.  Bone density scan. This is done to screen for osteoporosis. You may have this done starting at age 46.  Mammogram. This may be done every 1-2 years. Talk to your health care  provider about how often you should have regular mammograms. Talk with your health care provider about your test results, treatment options, and if necessary, the need for more tests.  Fall Prevention in the Home Falls can cause injuries. They can happen to people of all ages. There are many things you can do to  make your home safe and to help prevent falls. What can I do on the outside of my home?  Regularly fix the edges of walkways and driveways and fix any cracks.  Remove anything that might make you trip as you walk through a door, such as a raised step or threshold.  Trim any bushes or trees on the path to your home.  Use bright outdoor lighting.  Clear any walking paths of anything that might make someone trip, such as rocks or tools.  Regularly check to see if handrails are loose or broken. Make sure that both sides of any steps have handrails.  Any raised decks and porches should have guardrails on the edges.  Have any leaves, snow, or ice cleared regularly.  Use sand or salt on walking paths during winter.  Clean up any spills in your garage right away. This includes oil or grease spills. What can I do in the bathroom?  Use night lights.  Install grab bars by the toilet and in the tub and shower. Do not use towel bars as grab bars.  Use non-skid mats or decals in the tub or shower.  If you need to sit down in the shower, use a plastic, non-slip stool.  Keep the floor dry. Clean up any water that spills on the floor as soon as it happens.  Remove soap buildup in the tub or shower regularly.  Attach bath mats securely with double-sided non-slip rug tape.  Do not have throw rugs and other things on the floor that can make you trip. What can I do in the bedroom?  Use night lights.  Make sure that you have a light by your bed that is easy to reach.  Do not use any sheets or blankets that are too big for your bed. They should not hang down onto the floor.  Have a firm chair that has side arms. You can use this for support while you get dressed.  Do not have throw rugs and other things on the floor that can make you trip. What can I do in the kitchen?  Clean up any spills right away.  Avoid walking on wet floors.  Keep items that you use a lot in easy-to-reach  places.  If you need to reach something above you, use a strong step stool that has a grab bar.  Keep electrical cords out of the way.  Do not use floor polish or wax that makes floors slippery. If you must use wax, use non-skid floor wax.  Do not have throw rugs and other things on the floor that can make you trip. What can I do with my stairs?  Do not leave any items on the stairs.  Make sure that there are handrails on both sides of the stairs and use them. Fix handrails that are broken or loose. Make sure that handrails are as long as the stairways.  Check any carpeting to make sure that it is firmly attached to the stairs. Fix any carpet that is loose or worn.  Avoid having throw rugs at the top or bottom of the stairs. If you do have throw rugs, attach  them to the floor with carpet tape.  Make sure that you have a light switch at the top of the stairs and the bottom of the stairs. If you do not have them, ask someone to add them for you. What else can I do to help prevent falls?  Wear shoes that:  Do not have high heels.  Have rubber bottoms.  Are comfortable and fit you well.  Are closed at the toe. Do not wear sandals.  If you use a stepladder:  Make sure that it is fully opened. Do not climb a closed stepladder.  Make sure that both sides of the stepladder are locked into place.  Ask someone to hold it for you, if possible.  Clearly mark and make sure that you can see:  Any grab bars or handrails.  First and last steps.  Where the edge of each step is.  Use tools that help you move around (mobility aids) if they are needed. These include:  Canes.  Walkers.  Scooters.  Crutches.  Turn on the lights when you go into a dark area. Replace any light bulbs as soon as they burn out.  Set up your furniture so you have a clear path. Avoid moving your furniture around.  If any of your floors are uneven, fix them.  If there are any pets around you, be  aware of where they are.  Review your medicines with your doctor. Some medicines can make you feel dizzy. This can increase your chance of falling. Ask your doctor what other things that you can do to help prevent falls. This information is not intended to replace advice given to you by your health care provider. Make sure you discuss any questions you have with your health care provider. Document Released: 04/14/2009 Document Revised: 11/24/2015 Document Reviewed: 07/23/2014 Elsevier Interactive Patient Education  2017 [employer].

## 2018-04-16 ENCOUNTER — Other Ambulatory Visit: Payer: Self-pay | Admitting: Nurse Practitioner

## 2018-04-16 DIAGNOSIS — Z66 Do not resuscitate: Secondary | ICD-10-CM

## 2018-04-17 DIAGNOSIS — M9903 Segmental and somatic dysfunction of lumbar region: Secondary | ICD-10-CM | POA: Diagnosis not present

## 2018-04-17 DIAGNOSIS — M5416 Radiculopathy, lumbar region: Secondary | ICD-10-CM | POA: Diagnosis not present

## 2018-04-17 DIAGNOSIS — M5033 Other cervical disc degeneration, cervicothoracic region: Secondary | ICD-10-CM | POA: Diagnosis not present

## 2018-04-17 DIAGNOSIS — M9901 Segmental and somatic dysfunction of cervical region: Secondary | ICD-10-CM | POA: Diagnosis not present

## 2018-04-24 DIAGNOSIS — M5416 Radiculopathy, lumbar region: Secondary | ICD-10-CM | POA: Diagnosis not present

## 2018-04-24 DIAGNOSIS — M5033 Other cervical disc degeneration, cervicothoracic region: Secondary | ICD-10-CM | POA: Diagnosis not present

## 2018-04-24 DIAGNOSIS — M9903 Segmental and somatic dysfunction of lumbar region: Secondary | ICD-10-CM | POA: Diagnosis not present

## 2018-04-24 DIAGNOSIS — M9901 Segmental and somatic dysfunction of cervical region: Secondary | ICD-10-CM | POA: Diagnosis not present

## 2018-05-01 DIAGNOSIS — M5033 Other cervical disc degeneration, cervicothoracic region: Secondary | ICD-10-CM | POA: Diagnosis not present

## 2018-05-01 DIAGNOSIS — M9903 Segmental and somatic dysfunction of lumbar region: Secondary | ICD-10-CM | POA: Diagnosis not present

## 2018-05-01 DIAGNOSIS — M5416 Radiculopathy, lumbar region: Secondary | ICD-10-CM | POA: Diagnosis not present

## 2018-05-01 DIAGNOSIS — M9901 Segmental and somatic dysfunction of cervical region: Secondary | ICD-10-CM | POA: Diagnosis not present

## 2018-05-08 DIAGNOSIS — M9901 Segmental and somatic dysfunction of cervical region: Secondary | ICD-10-CM | POA: Diagnosis not present

## 2018-05-08 DIAGNOSIS — M5033 Other cervical disc degeneration, cervicothoracic region: Secondary | ICD-10-CM | POA: Diagnosis not present

## 2018-05-08 DIAGNOSIS — M5416 Radiculopathy, lumbar region: Secondary | ICD-10-CM | POA: Diagnosis not present

## 2018-05-08 DIAGNOSIS — M9903 Segmental and somatic dysfunction of lumbar region: Secondary | ICD-10-CM | POA: Diagnosis not present

## 2018-05-15 DIAGNOSIS — M5033 Other cervical disc degeneration, cervicothoracic region: Secondary | ICD-10-CM | POA: Diagnosis not present

## 2018-05-15 DIAGNOSIS — M9901 Segmental and somatic dysfunction of cervical region: Secondary | ICD-10-CM | POA: Diagnosis not present

## 2018-05-15 DIAGNOSIS — M9903 Segmental and somatic dysfunction of lumbar region: Secondary | ICD-10-CM | POA: Diagnosis not present

## 2018-05-15 DIAGNOSIS — M5416 Radiculopathy, lumbar region: Secondary | ICD-10-CM | POA: Diagnosis not present

## 2018-05-22 DIAGNOSIS — M9901 Segmental and somatic dysfunction of cervical region: Secondary | ICD-10-CM | POA: Diagnosis not present

## 2018-05-22 DIAGNOSIS — M9903 Segmental and somatic dysfunction of lumbar region: Secondary | ICD-10-CM | POA: Diagnosis not present

## 2018-05-22 DIAGNOSIS — M5416 Radiculopathy, lumbar region: Secondary | ICD-10-CM | POA: Diagnosis not present

## 2018-05-22 DIAGNOSIS — M5033 Other cervical disc degeneration, cervicothoracic region: Secondary | ICD-10-CM | POA: Diagnosis not present

## 2018-05-26 ENCOUNTER — Other Ambulatory Visit: Payer: Self-pay | Admitting: Family Medicine

## 2018-05-26 DIAGNOSIS — I10 Essential (primary) hypertension: Secondary | ICD-10-CM

## 2018-06-05 DIAGNOSIS — M5033 Other cervical disc degeneration, cervicothoracic region: Secondary | ICD-10-CM | POA: Diagnosis not present

## 2018-06-05 DIAGNOSIS — M9903 Segmental and somatic dysfunction of lumbar region: Secondary | ICD-10-CM | POA: Diagnosis not present

## 2018-06-05 DIAGNOSIS — M5416 Radiculopathy, lumbar region: Secondary | ICD-10-CM | POA: Diagnosis not present

## 2018-06-05 DIAGNOSIS — M9901 Segmental and somatic dysfunction of cervical region: Secondary | ICD-10-CM | POA: Diagnosis not present

## 2018-06-06 DIAGNOSIS — Z08 Encounter for follow-up examination after completed treatment for malignant neoplasm: Secondary | ICD-10-CM | POA: Diagnosis not present

## 2018-06-06 DIAGNOSIS — Z85828 Personal history of other malignant neoplasm of skin: Secondary | ICD-10-CM | POA: Diagnosis not present

## 2018-06-06 DIAGNOSIS — D2272 Melanocytic nevi of left lower limb, including hip: Secondary | ICD-10-CM | POA: Diagnosis not present

## 2018-06-06 DIAGNOSIS — D225 Melanocytic nevi of trunk: Secondary | ICD-10-CM | POA: Diagnosis not present

## 2018-06-06 DIAGNOSIS — D2262 Melanocytic nevi of left upper limb, including shoulder: Secondary | ICD-10-CM | POA: Diagnosis not present

## 2018-06-06 DIAGNOSIS — D2261 Melanocytic nevi of right upper limb, including shoulder: Secondary | ICD-10-CM | POA: Diagnosis not present

## 2018-06-06 DIAGNOSIS — L570 Actinic keratosis: Secondary | ICD-10-CM | POA: Diagnosis not present

## 2018-06-06 DIAGNOSIS — X32XXXA Exposure to sunlight, initial encounter: Secondary | ICD-10-CM | POA: Diagnosis not present

## 2018-06-06 DIAGNOSIS — L718 Other rosacea: Secondary | ICD-10-CM | POA: Diagnosis not present

## 2018-06-06 DIAGNOSIS — Z85820 Personal history of malignant melanoma of skin: Secondary | ICD-10-CM | POA: Diagnosis not present

## 2018-06-06 DIAGNOSIS — D2271 Melanocytic nevi of right lower limb, including hip: Secondary | ICD-10-CM | POA: Diagnosis not present

## 2018-06-12 DIAGNOSIS — M5033 Other cervical disc degeneration, cervicothoracic region: Secondary | ICD-10-CM | POA: Diagnosis not present

## 2018-06-12 DIAGNOSIS — M5416 Radiculopathy, lumbar region: Secondary | ICD-10-CM | POA: Diagnosis not present

## 2018-06-12 DIAGNOSIS — M9903 Segmental and somatic dysfunction of lumbar region: Secondary | ICD-10-CM | POA: Diagnosis not present

## 2018-06-12 DIAGNOSIS — M9901 Segmental and somatic dysfunction of cervical region: Secondary | ICD-10-CM | POA: Diagnosis not present

## 2018-06-19 DIAGNOSIS — M9903 Segmental and somatic dysfunction of lumbar region: Secondary | ICD-10-CM | POA: Diagnosis not present

## 2018-06-19 DIAGNOSIS — M5416 Radiculopathy, lumbar region: Secondary | ICD-10-CM | POA: Diagnosis not present

## 2018-06-19 DIAGNOSIS — M9901 Segmental and somatic dysfunction of cervical region: Secondary | ICD-10-CM | POA: Diagnosis not present

## 2018-06-19 DIAGNOSIS — M5033 Other cervical disc degeneration, cervicothoracic region: Secondary | ICD-10-CM | POA: Diagnosis not present

## 2018-06-27 DIAGNOSIS — M9903 Segmental and somatic dysfunction of lumbar region: Secondary | ICD-10-CM | POA: Diagnosis not present

## 2018-06-27 DIAGNOSIS — M9901 Segmental and somatic dysfunction of cervical region: Secondary | ICD-10-CM | POA: Diagnosis not present

## 2018-06-27 DIAGNOSIS — M5416 Radiculopathy, lumbar region: Secondary | ICD-10-CM | POA: Diagnosis not present

## 2018-06-27 DIAGNOSIS — M5033 Other cervical disc degeneration, cervicothoracic region: Secondary | ICD-10-CM | POA: Diagnosis not present

## 2018-06-30 ENCOUNTER — Other Ambulatory Visit: Payer: Self-pay | Admitting: Family Medicine

## 2018-06-30 DIAGNOSIS — Z1231 Encounter for screening mammogram for malignant neoplasm of breast: Secondary | ICD-10-CM

## 2018-07-03 DIAGNOSIS — M5033 Other cervical disc degeneration, cervicothoracic region: Secondary | ICD-10-CM | POA: Diagnosis not present

## 2018-07-03 DIAGNOSIS — M9903 Segmental and somatic dysfunction of lumbar region: Secondary | ICD-10-CM | POA: Diagnosis not present

## 2018-07-03 DIAGNOSIS — M9901 Segmental and somatic dysfunction of cervical region: Secondary | ICD-10-CM | POA: Diagnosis not present

## 2018-07-03 DIAGNOSIS — M5416 Radiculopathy, lumbar region: Secondary | ICD-10-CM | POA: Diagnosis not present

## 2018-07-10 ENCOUNTER — Encounter: Payer: Self-pay | Admitting: Family Medicine

## 2018-07-10 ENCOUNTER — Ambulatory Visit (INDEPENDENT_AMBULATORY_CARE_PROVIDER_SITE_OTHER): Payer: Medicare Other | Admitting: Family Medicine

## 2018-07-10 VITALS — BP 150/60 | HR 70 | Temp 98.1°F | Resp 16 | Ht 63.0 in | Wt 206.5 lb

## 2018-07-10 DIAGNOSIS — M1711 Unilateral primary osteoarthritis, right knee: Secondary | ICD-10-CM

## 2018-07-10 DIAGNOSIS — E1169 Type 2 diabetes mellitus with other specified complication: Secondary | ICD-10-CM

## 2018-07-10 DIAGNOSIS — M810 Age-related osteoporosis without current pathological fracture: Secondary | ICD-10-CM

## 2018-07-10 DIAGNOSIS — M9903 Segmental and somatic dysfunction of lumbar region: Secondary | ICD-10-CM | POA: Diagnosis not present

## 2018-07-10 DIAGNOSIS — D692 Other nonthrombocytopenic purpura: Secondary | ICD-10-CM | POA: Diagnosis not present

## 2018-07-10 DIAGNOSIS — I10 Essential (primary) hypertension: Secondary | ICD-10-CM

## 2018-07-10 DIAGNOSIS — B372 Candidiasis of skin and nail: Secondary | ICD-10-CM

## 2018-07-10 DIAGNOSIS — E785 Hyperlipidemia, unspecified: Secondary | ICD-10-CM | POA: Diagnosis not present

## 2018-07-10 DIAGNOSIS — I700 Atherosclerosis of aorta: Secondary | ICD-10-CM

## 2018-07-10 DIAGNOSIS — M5033 Other cervical disc degeneration, cervicothoracic region: Secondary | ICD-10-CM | POA: Diagnosis not present

## 2018-07-10 DIAGNOSIS — M5416 Radiculopathy, lumbar region: Secondary | ICD-10-CM | POA: Diagnosis not present

## 2018-07-10 DIAGNOSIS — M9901 Segmental and somatic dysfunction of cervical region: Secondary | ICD-10-CM | POA: Diagnosis not present

## 2018-07-10 MED ORDER — FLUCONAZOLE 150 MG PO TABS: 150.0000 mg | ORAL_TABLET | ORAL | 0 refills | Status: DC

## 2018-07-10 MED ORDER — NYSTATIN 100000 UNIT/GM EX CREA: 1.0000 "application " | TOPICAL_CREAM | Freq: Two times a day (BID) | CUTANEOUS | 2 refills | Status: DC

## 2018-07-10 NOTE — Progress Notes (Signed)
Name: Bonnie Rowland   MRN: [id number]    DOB: August 28, 1940   Date:07/10/2018       Progress Note  Subjective  Chief Complaint  Chief Complaint  Patient presents with  . Diabetes  . Hypertension    HPI  Osteoporosis: she was non Evista for years but bone density got worse and has been getting Reclast from  Dr. Manfred Shirts and is now on Reclast since  June 7th, 2019, every 6 months, she is on higher dose of vitamin D also , currently on 4000 units daily   DM: seeing Endocrinologist - Dr. Manfred Shirts  up to date with eye exam, foot exam and hgbA1C done 03/13/2018 and it was better at 7.2% continue current regiment, on Bydureon, pre-meal insulin and basal insulin, she had a couple of low glucose - 47 once in early am -but otherwise very good, average this week 138 . She states not as consistent with diet over the holidays . No polyphagia, polydipsia or polyuria. She is on Bydureon and diarrhea and bloating has improved On statin therapy   HTN: BP is elevated today, and she has not been checking at home lately. She states a lot of stress down sizing to move to [facility]. She has a follow up with Dr. Rockey Situ in a couple of weeks, advised to recheck bp at home and if still high she will medication adjustment . No  chest pain, palpitation, no decrease in exercise tolerance. She denies dizziness.   Vaginal pain: she saw  Urologist for evaluation of supra pubic pain and studies have been negative, she is using estradiol and is doing better now.   Atherosclerosis of aorta: on statin therapy and also aspirin. Last LDL had gone up, but she is now taking Crestor more consistantly  Senile purpura: Unchanged.   Morbid Obesity: BMI above 35 with co-morbidities. She states not as consistent with diet over the holidays, but has been doing better now and advised her to try to go down to weight of 197 lbs  Patient Active Problem List   Diagnosis Date Noted  . Age-related osteoporosis without current  pathological fracture 11/05/2017  . 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([place]) 07/09/2017  . Atherosclerosis of abdominal aorta ([place]) 07/09/2017  . Bilateral carotid bruits 08/24/2015  . 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 Atrial flutter ([place]) 02/22/2015  . Chronic pain 01/10/2015  . 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 Fatigue 01/10/2015  . History of melanoma excision 01/10/2015  . Morbid obesity ([place]) 01/10/2015  . 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 Spinal stenosis, lumbar region, 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  . DDD (degenerative disc disease), lumbar 11/14/2014  . Greater trochanteric bursitis of both hips 11/14/2014  . Piriformis syndrome 11/14/2014  . 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 Hyperlipemia 11/07/2009  . Hypertension, benign 11/07/2009  . Atherosclerosis of renal artery ([place]) 11/07/2009  . Hyperlipidemia due to type 2 diabetes mellitus ([place]) 11/07/2009  . Perennial allergic rhinitis 11/27/2007  . Lumbosacral neuritis 01/17/2007    Past Surgical History:  Procedure Laterality Date  . APPENDECTOMY    . CARDIAC CATHETERIZATION    . cataract surgery    . COLONOSCOPY    . COLONOSCOPY WITH PROPOFOL N/A 05/09/2016   Procedure: COLONOSCOPY WITH PROPOFOL;  Surgeon: Robert Bellow, MD;  Location: [facility] ENDOSCOPY;  Service: Endoscopy;  Laterality: N/A;  . DIAGNOSTIC MAMMOGRAM    . EYE SURGERY Bilateral  Cataract Extraction  . KNEE ARTHROPLASTY Right 03/30/2015   Procedure: COMPUTER ASSISTED TOTAL KNEE ARTHROPLASTY;  Surgeon: Dereck Leep, MD;  Location: [facility] ORS;  Service: Orthopedics;  Laterality: Right;  . KNEE ARTHROSCOPY Right   . KNEE CLOSED REDUCTION Right 05/23/2015   Procedure: CLOSED MANIPULATION KNEE;  Surgeon: Dereck Leep, MD;  Location: [facility] ORS;  Service: Orthopedics;  Laterality: Right;  . TONSILLECTOMY      Family History  Problem Relation Age of  Onset  . Diabetes Mother   . Hypertension Mother   . Cancer Mother   . Hypertension Father   . Cancer Father        Prostate  . Breast cancer Maternal Aunt 70  . Colon cancer Son   . Kidney cancer Neg Hx   . Bladder Cancer Neg Hx     Social History   Socioeconomic History  . Marital status: Married    Spouse name: Emergency planning/management officer  . Number of children: 1  . Years of education: Not on file  . Highest education level: Master's degree (e.g., MA, MS, MEng, MEd, MSW, MBA)  Occupational History  . Occupation: Retired  Scientific laboratory technician  . Financial resource strain: Not hard at all  . Food insecurity:    Worry: Never true    Inability: Never true  . Transportation needs:    Medical: No    Non-medical: No  Tobacco Use  . Smoking status: Never Smoker  . Smokeless tobacco: Never Used  Substance and Sexual Activity  . Alcohol use: Not Currently    Alcohol/week: 0.0 standard drinks  . Drug use: No  . Sexual activity: Yes    Partners: Male  Lifestyle  . Physical activity:    Days per week: 3 days    Minutes per session: 40 min  . Stress: Not at all  Relationships  . Social connections:    Talks on phone: More than three times a week    Gets together: More than three times a week    Attends religious service: Never    Active member of club or organization: Yes    Attends meetings of clubs or organizations: More than 4 times per year    Relationship status: Married  . Intimate partner violence:    Fear of current or ex partner: No    Emotionally abused: No    Physically abused: No    Forced sexual activity: No  Other Topics Concern  . Not on file  Social History Narrative   She is married , only has one son and he was diagnosed with colon cancer at age 56.      Current Outpatient Medications:  .  ALFALFA PO, Take 3 tablets by mouth every morning., Disp: , Rfl:  .  aspirin 81 MG EC tablet, Take 81 mg by mouth at bedtime. , Disp: , Rfl:  .  B Complex-C (B-COMPLEX WITH VITAMIN C)  tablet, Take 2 tablets by mouth at bedtime. , Disp: , Rfl:  .  BD PEN NEEDLE NANO U/F 32G X 4 MM MISC, USE 4 TIMES DAILY (HUMALOG 3 TIMES DAILY AND LANTUS ONCE DAILY) OFFICE NOTIFIED 08/06/17, Disp: 400 each, Rfl: 1 .  BLACK ELDERBERRY,BERRY-FLOWER, PO, Take by mouth., Disp: , Rfl:  .  calcium carbonate (OS-CAL) 600 MG TABS tablet, Take 600 mg by mouth daily., Disp: , Rfl:  .  cholecalciferol (VITAMIN D) 1000 UNITS tablet, Take 4,000 Units by mouth daily. , Disp: , Rfl:  .  clobetasol  ointment (TEMOVATE) 1.19 %, Apply 1 application topically 2 (two) times daily as needed (bug bites). Reported on 06/30/2015, Disp: 30 g, Rfl: 0 .  Coenzyme Q10 (COQ10) 200 MG CAPS, Take 1 tablet by mouth daily. 400 mg every am., Disp: , Rfl:  .  Coenzyme Q10-Vitamin E 100-100 MG-UNIT CAPS, Take by mouth., Disp: , Rfl:  .  Continuous Blood Gluc Sensor ([place]) MISC, by Does not apply route., Disp: , Rfl:  .  estradiol (ESTRACE VAGINAL) 0.1 MG/GM vaginal cream, 1/2 gm once weekly using applicator, apply blueberry sized amount of cream using tip of finger to urethra twice weekly, Disp: 50 g, Rfl: 4 .  Exenatide ER 2 MG SRER, Inject 2 mg into the skin once a week., Disp: 12 each, Rfl: 0 .  Eyelid Cleansers (AVENOVA) 0.01 % SOLN, See admin instructions., Disp: , Rfl: 3 .  fish oil-omega-3 fatty acids 1000 MG capsule, Take 2 g by mouth daily.  , Disp: , Rfl:  .  fluconazole (DIFLUCAN) 150 MG tablet, Take 1 tablet (150 mg total) by mouth every other day., Disp: 3 tablet, Rfl: 0 .  fluticasone (FLONASE) 50 MCG/ACT nasal spray, PLACE 2 SPRAYS INTO BOTH NOSTRILS DAILY., Disp: 8.5 g, Rfl: 2 .  GARLIC 4174 PO, Take 1 tablet by mouth daily. 1 gram, Disp: , Rfl:  .  Glucosamine 500 MG TABS, Take 1 tablet by mouth daily. , Disp: , Rfl:  .  HUMALOG KWIKPEN 100 UNIT/ML KiwkPen, Inject 6 Units into the skin 3 (three) times daily after meals., Disp: , Rfl: 3 .  Insulin Glargine (LANTUS SOLOSTAR) 100 UNIT/ML Solostar  Pen, Inject 24 Units into the skin every morning., Disp: 15 pen, Rfl: 2 .  losartan-hydrochlorothiazide (HYZAAR) 100-25 MG tablet, TAKE 1 TABLET BY MOUTH EVERY DAY, Disp: 90 tablet, Rfl: 1 .  Mag Aspart-Potassium Aspart (POTASSIUM & MAGNESIUM ASPARTAT PO), Take 1 tablet by mouth daily., Disp: , Rfl:  .  metoprolol succinate (TOPROL-XL) 25 MG 24 hr tablet, Take 1 tablet (25 mg total) by mouth daily., Disp: 90 tablet, Rfl: 3 .  Multiple Vitamins-Minerals (PRESERVISION AREDS 2) CAPS, Take 1 capsule by mouth 2 (two) times daily., Disp: , Rfl:  .  nystatin cream (MYCOSTATIN), Apply 1 application topically 2 (two) times daily., Disp: 60 g, Rfl: 2 .  Probiotic Product (PROBIOTIC PO), Take 1 Dose by mouth at bedtime., Disp: , Rfl:  .  Propylene Glycol (SYSTANE BALANCE OP), Apply 1 drop to eye as needed., Disp: , Rfl:  .  rosuvastatin (CRESTOR) 10 MG tablet, Take 1 tablet (10 mg total) by mouth daily., Disp: 90 tablet, Rfl: 1 .  sodium fluoride (DENTAGEL) 1.1 % GEL dental gel, , Disp: , Rfl:  .  sulfacetamide (BLEPH-10) 10 % ophthalmic solution, Place 1 drop into both eyes 3 (three) times daily as needed., Disp: , Rfl:  .  vitamin E (VITAMIN E) 400 UNIT capsule, Take 400 Units by mouth daily. Every am, Disp: , Rfl:  .  Zoledronic Acid (RECLAST IV), Inject into the vein. Every year, Disp: , Rfl:   Allergies  Allergen Reactions  . Cyclobenzaprine Hypertension  . Cheese Other (See Comments)    bloating  . Ciprofloxacin Hcl Other (See Comments)    Muscle pain  . Coconut Oil   . Keflex [Cephalexin] Other (See Comments)    Patient prefers not to take this medication due to the side effects    I personally reviewed active problem list, medication list, allergies,  family history, social history with the patient/caregiver today.   ROS  Constitutional: Negative for fever or significant  weight change.  Respiratory: Negative for cough and shortness of breath.   Cardiovascular: Negative for chest pain or  palpitations.  Gastrointestinal: Negative for abdominal pain, no bowel changes.  Musculoskeletal: positive  for gait problem and right knee  joint swelling.  Skin: Negative for rash.  Neurological: Negative for dizziness or headache.  No other specific complaints in a complete review of systems (except as listed in HPI above).  Objective  Vitals:   07/10/18 0758  BP: (!) 150/60  Pulse: 70  Resp: 16  Temp: 98.1 F (36.7 C)  TempSrc: Oral  SpO2: 96%  Weight: 206 lb 8 oz (93.7 kg)  Height: 5\' 3"  (1.6 m)    Body mass index is 36.58 kg/m.  Physical Exam  Constitutional: Patient appears well-developed and well-nourished. Obese  No distress.  HEENT: head atraumatic, normocephalic, pupils equal and reactive to light,  neck supple, throat within normal limits Cardiovascular: Normal rate, regular rhythm and normal heart sounds.  No murmur heard. No BLE edema. Pulmonary/Chest: Effort normal and breath sounds normal. No respiratory distress. Abdominal: Soft.  There is no tenderness. Psychiatric: Patient has a normal mood and affect. behavior is normal. Judgment and thought content normal.  Recent Results (from the past 2160 hour(s))  COMPLETE METABOLIC PANEL WITH GFR     Status: Abnormal   Collection Time: 04/11/18 10:32 AM  Result Value Ref Range   Glucose, Bld 138 (H) 65 - 99 mg/dL    Comment: .            Fasting reference interval . For someone without known diabetes, a glucose value >125 mg/dL indicates that they may have diabetes and this should be confirmed with a follow-up test. .    BUN 13 7 - 25 mg/dL   Creat 0.63 0.60 - 0.93 mg/dL    Comment: For patients >61 years of age, the reference limit for Creatinine is approximately 13% higher for people identified as African-American. .    GFR, Est Non African American 88 > OR = 60 mL/min/1.43m2   GFR, Est African American 102 > OR = 60 mL/min/1.91m2   BUN/Creatinine Ratio NOT APPLICABLE 6 - 22 (calc)   Sodium 139 135  - 146 mmol/L   Potassium 3.9 3.5 - 5.3 mmol/L   Chloride 100 98 - 110 mmol/L   CO2 31 20 - 32 mmol/L   Calcium 9.3 8.6 - 10.4 mg/dL   Total Protein 6.2 6.1 - 8.1 g/dL   Albumin 3.9 3.6 - 5.1 g/dL   Globulin 2.3 1.9 - 3.7 g/dL (calc)   AG Ratio 1.7 1.0 - 2.5 (calc)   Total Bilirubin 0.7 0.2 - 1.2 mg/dL   Alkaline phosphatase (APISO) 79 33 - 130 U/L   AST 20 10 - 35 U/L   ALT 16 6 - 29 U/L  CBC     Status: None   Collection Time: 04/11/18 10:32 AM  Result Value Ref Range   WBC 5.6 3.8 - 10.8 Thousand/uL   RBC 4.31 3.80 - 5.10 Million/uL   Hemoglobin 13.5 11.7 - 15.5 g/dL   HCT 40.0 35.0 - 45.0 %   MCV 92.8 80.0 - 100.0 fL   MCH 31.3 27.0 - 33.0 pg   MCHC 33.8 32.0 - 36.0 g/dL   RDW 11.9 11.0 - 15.0 %   Platelets 228 140 - 400 Thousand/uL   MPV 11.3 7.5 -  12.5 fL  TSH     Status: None   Collection Time: 04/11/18 10:32 AM  Result Value Ref Range   TSH 2.82 0.40 - 4.50 mIU/L  Lipid Profile     Status: None   Collection Time: 04/11/18 10:32 AM  Result Value Ref Range   Cholesterol 158 <200 mg/dL   HDL 60 >50 mg/dL   Triglycerides 78 <150 mg/dL   LDL Cholesterol (Calc) 82 mg/dL (calc)    Comment: Reference range: <100 . Desirable range <100 mg/dL for primary prevention;   <70 mg/dL for patients with CHD or diabetic patients  with > or = 2 CHD risk factors. Marland Kitchen LDL-C is now calculated using the Martin-Hopkins  calculation, which is a validated novel method providing  better accuracy than the Friedewald equation in the  estimation of LDL-C.  Cresenciano Genre et al. Annamaria Helling. 7619;509(32): 2061-2068  (http://education.[facility].com/faq/FAQ164)    Total CHOL/HDL Ratio 2.6 <5.0 (calc)   Non-HDL Cholesterol (Calc) 98 <130 mg/dL (calc)    Comment: For patients with diabetes plus 1 major ASCVD risk  factor, treating to a non-HDL-C goal of <100 mg/dL  (LDL-C of <70 mg/dL) is considered a therapeutic  option.     PHQ2/9: Depression screen [facility] 2/9 07/10/2018 04/11/2018 01/06/2018  08/22/2017 04/08/2017  Decreased Interest 0 0 0 0 0  Down, Depressed, Hopeless 0 0 0 0 0  PHQ - 2 Score 0 0 0 0 0     Fall Risk: Fall Risk  07/10/2018 04/11/2018 01/06/2018 08/22/2017 07/09/2017  Falls in the past year? 0 No No No No  Comment - - - - -  Number falls in past yr: 0 - - - -  Injury with Fall? 0 - - - -  Risk for fall due to : - - - - -  Risk for fall due to: Comment - - - - -    Assessment & Plan  1. Dyslipidemia associated with type 2 diabetes mellitus ([place])  She has been more compliant with crestor lately, and we will recheck in 6 months   2. Candida infection of flexural skin  - fluconazole (DIFLUCAN) 150 MG tablet; Take 1 tablet (150 mg total) by mouth every other day.  Dispense: 3 tablet; Refill: 0 - nystatin cream (MYCOSTATIN); Apply 1 application topically 2 (two) times daily.  Dispense: 60 g; Refill: 2  3. Essential hypertension  BP is elevated today, she is a little stressed with moving to [facility] in the Summer and has been packing up to move  4. Osteoporosis, post-menopausal  Getting Reclast   5. 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(HCC)  Stable  6. Dyslipidemia  On Crestor   7. Primary osteoarthritis of right knee  She has a follow up with Dr. Marry Guan soon   8. Atherosclerosis of abdominal aorta (HCC)  On statin   9. Morbid obesity ([place])  BMI above 35 with co-morbidities.   Discussed with the patient the risk posed by an increased BMI. Discussed importance of portion control, calorie counting and at least 150 minutes of physical activity weekly. Avoid sweet beverages and drink more water. Eat at least 6 servings of fruit and vegetables daily   Goal is to get weight around 197 lbs to reach BMI below 35

## 2018-07-14 DIAGNOSIS — E113293 Type 2 diabetes mellitus with mild nonproliferative diabetic retinopathy without macular edema, bilateral: Secondary | ICD-10-CM | POA: Diagnosis not present

## 2018-07-17 DIAGNOSIS — M9901 Segmental and somatic dysfunction of cervical region: Secondary | ICD-10-CM | POA: Diagnosis not present

## 2018-07-17 DIAGNOSIS — M5416 Radiculopathy, lumbar region: Secondary | ICD-10-CM | POA: Diagnosis not present

## 2018-07-17 DIAGNOSIS — E559 Vitamin D deficiency, unspecified: Secondary | ICD-10-CM | POA: Diagnosis not present

## 2018-07-17 DIAGNOSIS — M5033 Other cervical disc degeneration, cervicothoracic region: Secondary | ICD-10-CM | POA: Diagnosis not present

## 2018-07-17 DIAGNOSIS — I10 Essential (primary) hypertension: Secondary | ICD-10-CM | POA: Diagnosis not present

## 2018-07-17 DIAGNOSIS — E1165 Type 2 diabetes mellitus with hyperglycemia: Secondary | ICD-10-CM | POA: Diagnosis not present

## 2018-07-17 DIAGNOSIS — E785 Hyperlipidemia, unspecified: Secondary | ICD-10-CM | POA: Diagnosis not present

## 2018-07-17 DIAGNOSIS — M9903 Segmental and somatic dysfunction of lumbar region: Secondary | ICD-10-CM | POA: Diagnosis not present

## 2018-07-17 DIAGNOSIS — E1159 Type 2 diabetes mellitus with other circulatory complications: Secondary | ICD-10-CM | POA: Diagnosis not present

## 2018-07-17 DIAGNOSIS — E1169 Type 2 diabetes mellitus with other specified complication: Secondary | ICD-10-CM | POA: Diagnosis not present

## 2018-07-17 DIAGNOSIS — Z794 Long term (current) use of insulin: Secondary | ICD-10-CM | POA: Diagnosis not present

## 2018-07-20 ENCOUNTER — Other Ambulatory Visit: Payer: Self-pay | Admitting: Family Medicine

## 2018-07-20 DIAGNOSIS — I701 Atherosclerosis of renal artery: Secondary | ICD-10-CM

## 2018-07-20 DIAGNOSIS — E1169 Type 2 diabetes mellitus with other specified complication: Secondary | ICD-10-CM

## 2018-07-20 DIAGNOSIS — E785 Hyperlipidemia, unspecified: Principal | ICD-10-CM

## 2018-07-24 DIAGNOSIS — M9901 Segmental and somatic dysfunction of cervical region: Secondary | ICD-10-CM | POA: Diagnosis not present

## 2018-07-24 DIAGNOSIS — M5416 Radiculopathy, lumbar region: Secondary | ICD-10-CM | POA: Diagnosis not present

## 2018-07-24 DIAGNOSIS — M5033 Other cervical disc degeneration, cervicothoracic region: Secondary | ICD-10-CM | POA: Diagnosis not present

## 2018-07-24 DIAGNOSIS — M9903 Segmental and somatic dysfunction of lumbar region: Secondary | ICD-10-CM | POA: Diagnosis not present

## 2018-08-07 DIAGNOSIS — M5416 Radiculopathy, lumbar region: Secondary | ICD-10-CM | POA: Diagnosis not present

## 2018-08-07 DIAGNOSIS — M9903 Segmental and somatic dysfunction of lumbar region: Secondary | ICD-10-CM | POA: Diagnosis not present

## 2018-08-07 DIAGNOSIS — M5033 Other cervical disc degeneration, cervicothoracic region: Secondary | ICD-10-CM | POA: Diagnosis not present

## 2018-08-07 DIAGNOSIS — M9901 Segmental and somatic dysfunction of cervical region: Secondary | ICD-10-CM | POA: Diagnosis not present

## 2018-08-14 ENCOUNTER — Ambulatory Visit
Admission: RE | Admit: 2018-08-14 | Discharge: 2018-08-14 | Disposition: A | Discharge status: Home / Self Care | Payer: Medicare Other | Source: Ambulatory Visit | Attending: Family Medicine | Admitting: Family Medicine

## 2018-08-14 DIAGNOSIS — M5033 Other cervical disc degeneration, cervicothoracic region: Secondary | ICD-10-CM | POA: Diagnosis not present

## 2018-08-14 DIAGNOSIS — M5416 Radiculopathy, lumbar region: Secondary | ICD-10-CM | POA: Diagnosis not present

## 2018-08-14 DIAGNOSIS — M9901 Segmental and somatic dysfunction of cervical region: Secondary | ICD-10-CM | POA: Diagnosis not present

## 2018-08-14 DIAGNOSIS — Z1231 Encounter for screening mammogram for malignant neoplasm of breast: Secondary | ICD-10-CM | POA: Insufficient documentation

## 2018-08-14 DIAGNOSIS — M9903 Segmental and somatic dysfunction of lumbar region: Secondary | ICD-10-CM | POA: Diagnosis not present

## 2018-08-20 ENCOUNTER — Other Ambulatory Visit: Payer: Self-pay | Admitting: Nurse Practitioner

## 2018-08-20 DIAGNOSIS — Z20828 Contact with and (suspected) exposure to other viral communicable diseases: Secondary | ICD-10-CM

## 2018-08-20 MED ORDER — OSELTAMIVIR PHOSPHATE 75 MG PO CAPS: 75.0000 mg | ORAL_CAPSULE | Freq: Every day | ORAL | 0 refills | Status: DC

## 2018-08-21 DIAGNOSIS — M5416 Radiculopathy, lumbar region: Secondary | ICD-10-CM | POA: Diagnosis not present

## 2018-08-21 DIAGNOSIS — M9903 Segmental and somatic dysfunction of lumbar region: Secondary | ICD-10-CM | POA: Diagnosis not present

## 2018-08-21 DIAGNOSIS — M9901 Segmental and somatic dysfunction of cervical region: Secondary | ICD-10-CM | POA: Diagnosis not present

## 2018-08-21 DIAGNOSIS — M5033 Other cervical disc degeneration, cervicothoracic region: Secondary | ICD-10-CM | POA: Diagnosis not present

## 2018-08-25 NOTE — Progress Notes (Signed)
Cardiology Office Note  Date:  08/26/2018   ID:  Bonnie Rowland, Bonnie Rowland 1941/06/19, MRN [id number]  PCP:  Steele Sizer, MD   Chief Complaint  Patient presents with  . other    12 month f/u c/o bilateral arm sensation discomfort and  right knee pain. Meds reviewed verbally with pt.    HPI:  Bonnie Rowland is a very pleasant 78 y.o. woman with a history of chest pain, normal coronary arteries by cardiac catheterization April 2007 ,  < 50%  renal artery disease,  hyperlipidemia, diabetes, hypertension, obesity who presents for evaluation of recent episode of atrial flutter, status post right total knee replacement surgery  INTERVAL HISTORY: The patient reports today for follow up. She is doing well overall, however, she has felt some "tinges." Adding she thinks it is related to her hernia.   She does endorse more stress in her life. Stating her husband and her are moving to [facility] and will be selling the house they have lived in for 34 years. However, she is mostly happy about the move.  She continues to take her medication as prescribed. She continues to work on decreasing her HBA1C.  She has not been going to the gym as of right now, but has been staying busy at home moving things around. She does have some right knee pain causing her to move a little more slowly.   Today's Blood pressure 130/74  Lab work reviewed in detail Total Chol 158/ LDL 82 HBA1C 7.0 CR 0.63 Glucose 138  EKG personally reviewed by myself on todays visit Shows normal sinus rhythm. 70 bpm.    OTHER PAST MEDICAL HISTORY REVIEWED BY ME FOR TODAY'S VISIT: 02/21/15 she presented to [facility] for right total knee replacement by Dr. Marry Guan. She reported developing tachycardia that morning. She had extreme pain, was rushing to get to the hospital. EKG showed atrial flutter with ventricular rate 140 bpm. She was sent to the emergency room, 3 hours later was back in normal sinus rhythm. No medications were given  to restore normal sinus rhythm.  Hemoglobin A1c increased on prednisone, into the 8 range, now improved in the 7 range  Previous lab work Total cholesterol 120, LDL in the 50s  She also has a history of sciatica and a meniscus tear on the left. She had a echocardiogram in May 2006 which was essentially normal with ejection fraction 60%.   Stress test in 06, 03 and 01 had been normal.  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  PSH:    Past Surgical History:  Procedure Laterality Date  . APPENDECTOMY    . CARDIAC CATHETERIZATION    . cataract surgery    . COLONOSCOPY    . COLONOSCOPY WITH PROPOFOL N/A 05/09/2016   Procedure: COLONOSCOPY WITH PROPOFOL;  Surgeon: Robert Bellow, MD;  Location: [facility] ENDOSCOPY;  Service: Endoscopy;  Laterality: N/A;  . DIAGNOSTIC MAMMOGRAM    . EYE SURGERY Bilateral    Cataract Extraction  . KNEE ARTHROPLASTY Right 03/30/2015   Procedure: COMPUTER ASSISTED TOTAL KNEE ARTHROPLASTY;  Surgeon: Dereck Leep,  MD;  Location: [facility] ORS;  Service: Orthopedics;  Laterality: Right;  . KNEE ARTHROSCOPY Right   . KNEE CLOSED REDUCTION Right 05/23/2015   Procedure: CLOSED MANIPULATION KNEE;  Surgeon: Dereck Leep, MD;  Location: [facility] ORS;  Service: Orthopedics;  Laterality: Right;  . TONSILLECTOMY      Current Outpatient Medications  Medication Sig Dispense Refill  . ALFALFA PO Take 3 tablets by mouth every morning.    Marland Kitchen  aspirin 81 MG EC tablet Take 81 mg by mouth at bedtime.     . B Complex-C (B-COMPLEX WITH VITAMIN C) tablet Take 2 tablets by mouth at bedtime.     . BD PEN NEEDLE NANO U/F 32G X 4 MM MISC USE 4 TIMES DAILY (HUMALOG 3 TIMES DAILY AND LANTUS ONCE DAILY) OFFICE NOTIFIED 08/06/17 400 each 1  . BLACK ELDERBERRY,BERRY-FLOWER, PO Take by mouth.    . calcium carbonate (OS-CAL) 600 MG TABS tablet Take 600 mg by mouth daily.    . cholecalciferol (VITAMIN D) 1000 UNITS tablet Take 4,000 Units by mouth daily.     . clobetasol ointment (TEMOVATE) 3.90 % Apply 1 application topically 2 (two) times daily as needed (bug bites). Reported on 06/30/2015 30 g 0  . Coenzyme Q10 (COQ10) 200 MG CAPS Take 1 tablet by mouth daily. 400 mg every am.    . Continuous Blood Gluc Sensor ([place]) MISC by Does not apply route.    Marland Kitchen estradiol (ESTRACE VAGINAL) 0.1 MG/GM vaginal cream 1/2 gm once weekly using applicator, apply blueberry sized amount of cream using tip of finger to urethra twice weekly 50 g 4  . Exenatide ER 2 MG SRER Inject 2 mg into the skin once a week. 12 each 0  . Eyelid Cleansers (AVENOVA) 0.01 % SOLN See admin instructions.  3  . fish oil-omega-3 fatty acids 1000 MG capsule Take 2 g by mouth daily.      . fluconazole (DIFLUCAN) 150 MG tablet Take 1 tablet (150 mg total) by mouth every other day. 3 tablet 0  . fluticasone (FLONASE) 50 MCG/ACT nasal spray PLACE 2 SPRAYS INTO BOTH NOSTRILS DAILY. 8.5 g 2  . GARLIC 3009 PO Take 1 tablet by mouth daily. 1 gram    . Glucosamine 500 MG TABS Take 1 tablet by mouth daily.     Marland Kitchen HUMALOG KWIKPEN 100 UNIT/ML KiwkPen Inject 6 Units into the skin 3 (three) times daily after meals.  3  . Insulin Glargine (LANTUS SOLOSTAR) 100 UNIT/ML Solostar Pen Inject 24 Units into the skin every morning. 15 pen 2  . losartan-hydrochlorothiazide (HYZAAR) 100-25 MG tablet TAKE 1 TABLET BY MOUTH EVERY DAY 90 tablet 1  . Mag Aspart-Potassium Aspart (POTASSIUM &  MAGNESIUM ASPARTAT PO) Take 1 tablet by mouth daily.    . metoprolol succinate (TOPROL-XL) 25 MG 24 hr tablet Take 1 tablet (25 mg total) by mouth daily. 90 tablet 3  . Multiple Vitamins-Minerals (PRESERVISION AREDS 2) CAPS Take 1 capsule by mouth 2 (two) times daily.    Marland Kitchen nystatin cream (MYCOSTATIN) Apply 1 application topically 2 (two) times daily. 60 g 2  . oseltamivir (TAMIFLU) 75 MG capsule Take 1 capsule (75 mg total) by mouth daily. 7 capsule 0  . Probiotic Product (PROBIOTIC PO) Take 1 Dose by mouth at bedtime.    Marland Kitchen Propylene Glycol (SYSTANE BALANCE OP) Apply 1 drop to eye as needed.    . rosuvastatin (CRESTOR) 10 MG tablet TAKE 1 TABLET BY MOUTH EVERY DAY  90 tablet 1  . sodium fluoride (DENTAGEL) 1.1 % GEL dental gel     . sulfacetamide (BLEPH-10) 10 % ophthalmic solution Place 1 drop into both eyes 3 (three) times daily as needed.    . vitamin E (VITAMIN E) 400 UNIT capsule Take 400 Units by mouth daily. Every am    . Zoledronic Acid (RECLAST IV) Inject into the vein. Every year     No current facility-administered medications for this visit.      Allergies:   Cyclobenzaprine; Cheese; Ciprofloxacin hcl; Coconut oil; and Keflex [cephalexin]   Social History:  The patient  reports that she has never smoked. She has never used smokeless tobacco. She reports previous alcohol use. She reports that she does not use drugs.   Family History:   family history includes Breast cancer (age of onset: 29) in her maternal aunt; Cancer in her father and mother; Colon cancer in her son; Diabetes in her mother; Hypertension in her father and mother.    Review of Systems: Review of Systems  Constitutional: Negative.   Eyes: Negative.   Respiratory: Negative.  Negative for shortness of breath.   Cardiovascular: Negative.   Gastrointestinal: Negative.   Genitourinary: Negative.   Musculoskeletal: Positive for joint pain.  Neurological: Negative.   Psychiatric/Behavioral: Negative.   All other  systems reviewed and are negative.    PHYSICAL EXAM: VS:  BP 130/74 (BP Location: Left Arm, Patient Position: Sitting, Cuff Size: Large)   Pulse 70   Ht 5\' 3"  (1.6 m)   Wt 205 lb 12 oz (93.3 kg)   BMI 36.45 kg/m  , BMI Body mass index is 36.45 kg/m. Constitutional:  oriented to person, place, and time. No distress.  HENT: normal Head: Grossly normal Eyes:  no discharge. No scleral icterus.  Neck: No JVD, no carotid bruits  Cardiovascular: Regular rate and rhythm, no murmurs appreciated Pulmonary/Chest: Clear to auscultation bilaterally, no wheezes or rails Abdominal: Soft.  no distension.  no tenderness.  Musculoskeletal: Normal range of motion, no edema Neurological:  normal muscle tone. Coordination normal. No atrophy Skin: Skin warm and dry Psychiatric: normal affect, pleasant    Recent Labs: 04/11/2018: ALT 16; BUN 13; Creat 0.63; Hemoglobin 13.5; Platelets 228; Potassium 3.9; Sodium 139; TSH 2.82    Lipid Panel Lab Results  Component Value Date   CHOL 158 04/11/2018   HDL 60 04/11/2018   LDLCALC 82 04/11/2018   TRIG 78 04/11/2018      Wt Readings from Last 3 Encounters:  08/26/18 205 lb 12 oz (93.3 kg)  07/10/18 206 lb 8 oz (93.7 kg)  04/11/18 204 lb 1.6 oz (92.6 kg)       ASSESSMENT AND PLAN:  Hyperlipidemia, unspecified hyperlipidemia type  Plan: EKG 12-Lead Cholesterol is at goal on the current lipid regimen. No changes to the medications were made.  Stable  Hypertension, benign  Plan: EKG 12-Lead Blood pressure is well controlled on today's visit. No changes made to the medications. 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 EKG 12-Lead   Margit Banda  Is acting as a scribe for Ida Rogue, M.D., Ph.D.   I have reviewed the above documentation for accuracy and completeness, and I agree with the above.   Signed, Esmond Plants, M.D., Ph.D. 08/26/2018  [place], [place]

## 2018-08-26 ENCOUNTER — Ambulatory Visit (INDEPENDENT_AMBULATORY_CARE_PROVIDER_SITE_OTHER): Payer: Medicare Other | Admitting: Cardiovascular Disease

## 2018-08-26 ENCOUNTER — Encounter: Payer: Self-pay | Admitting: Cardiovascular Disease

## 2018-08-26 VITALS — BP 130/74 | HR 70 | Ht 63.0 in | Wt 205.8 lb

## 2018-08-26 DIAGNOSIS — I701 Atherosclerosis of renal artery: Secondary | ICD-10-CM

## 2018-08-26 DIAGNOSIS — I4892 Unspecified atrial flutter: Secondary | ICD-10-CM | POA: Diagnosis not present

## 2018-08-26 NOTE — Patient Instructions (Signed)
Medication Instructions:  No changes  If you need a refill on your cardiac medications before your next appointment, please call your pharmacy.    Lab work: No new labs needed   If you have labs (blood work) drawn today and your tests are completely normal, you will receive your results only by: Marland Kitchen MyChart Message (if you have MyChart) OR . A paper copy in the mail If you have any lab test that is abnormal or we need to change your treatment, we will call you to review the results.   Testing/Procedures: Renal artery u/s in one year   Follow-Up: At [facility], you and your health needs are our priority.  As part of our continuing mission to provide you with exceptional heart care, we have created designated Provider Care Teams.  These Care Teams include your primary Cardiologist (physician) and Advanced Practice Providers (APPs -  Physician Assistants and Nurse Practitioners) who all work together to provide you with the care you need, when you need it.  . You will need a follow up appointment in 12 months .   Please call our office 2 months in advance to schedule this appointment.    . Providers on your designated Care Team:   . Murray Hodgkins, NP . [name], [name] . Marrianne Mood, [name]  Any Other Special Instructions Will Be Listed Below (If Applicable).  For educational health videos Log in to : [email or web address] Or : [email or web address], password : [email or web address]

## 2018-08-28 DIAGNOSIS — M5416 Radiculopathy, lumbar region: Secondary | ICD-10-CM | POA: Diagnosis not present

## 2018-08-28 DIAGNOSIS — M5033 Other cervical disc degeneration, cervicothoracic region: Secondary | ICD-10-CM | POA: Diagnosis not present

## 2018-08-28 DIAGNOSIS — M9903 Segmental and somatic dysfunction of lumbar region: Secondary | ICD-10-CM | POA: Diagnosis not present

## 2018-08-28 DIAGNOSIS — M9901 Segmental and somatic dysfunction of cervical region: Secondary | ICD-10-CM | POA: Diagnosis not present

## 2018-08-29 ENCOUNTER — Other Ambulatory Visit: Payer: Self-pay

## 2018-08-29 DIAGNOSIS — E113299 Type 2 diabetes mellitus with mild nonproliferative diabetic retinopathy without macular edema, unspecified eye: Secondary | ICD-10-CM

## 2018-08-29 DIAGNOSIS — E1165 Type 2 diabetes mellitus with hyperglycemia: Principal | ICD-10-CM

## 2018-08-29 DIAGNOSIS — IMO0002 Reserved for concepts with insufficient information to code with codable children: Secondary | ICD-10-CM

## 2018-08-29 DIAGNOSIS — Z794 Long term (current) use of insulin: Principal | ICD-10-CM

## 2018-08-29 NOTE — Telephone Encounter (Signed)
Refill request for general medication. BD Pen Needs Nano   Last office visit 07/10/2018   Follow up on 01/08/2019

## 2018-09-05 DIAGNOSIS — M5033 Other cervical disc degeneration, cervicothoracic region: Secondary | ICD-10-CM | POA: Diagnosis not present

## 2018-09-05 DIAGNOSIS — M9901 Segmental and somatic dysfunction of cervical region: Secondary | ICD-10-CM | POA: Diagnosis not present

## 2018-09-05 DIAGNOSIS — M5416 Radiculopathy, lumbar region: Secondary | ICD-10-CM | POA: Diagnosis not present

## 2018-09-05 DIAGNOSIS — M9903 Segmental and somatic dysfunction of lumbar region: Secondary | ICD-10-CM | POA: Diagnosis not present

## 2018-09-09 DIAGNOSIS — M1711 Unilateral primary osteoarthritis, right knee: Secondary | ICD-10-CM | POA: Diagnosis not present

## 2018-09-09 DIAGNOSIS — Z96651 Presence of right artificial knee joint: Secondary | ICD-10-CM | POA: Diagnosis not present

## 2018-09-11 DIAGNOSIS — M5033 Other cervical disc degeneration, cervicothoracic region: Secondary | ICD-10-CM | POA: Diagnosis not present

## 2018-09-11 DIAGNOSIS — M9901 Segmental and somatic dysfunction of cervical region: Secondary | ICD-10-CM | POA: Diagnosis not present

## 2018-09-11 DIAGNOSIS — M5416 Radiculopathy, lumbar region: Secondary | ICD-10-CM | POA: Diagnosis not present

## 2018-09-11 DIAGNOSIS — M9903 Segmental and somatic dysfunction of lumbar region: Secondary | ICD-10-CM | POA: Diagnosis not present

## 2018-09-18 DIAGNOSIS — M9903 Segmental and somatic dysfunction of lumbar region: Secondary | ICD-10-CM | POA: Diagnosis not present

## 2018-09-18 DIAGNOSIS — M9901 Segmental and somatic dysfunction of cervical region: Secondary | ICD-10-CM | POA: Diagnosis not present

## 2018-09-18 DIAGNOSIS — M5033 Other cervical disc degeneration, cervicothoracic region: Secondary | ICD-10-CM | POA: Diagnosis not present

## 2018-09-18 DIAGNOSIS — M5416 Radiculopathy, lumbar region: Secondary | ICD-10-CM | POA: Diagnosis not present

## 2018-09-25 DIAGNOSIS — M5416 Radiculopathy, lumbar region: Secondary | ICD-10-CM | POA: Diagnosis not present

## 2018-09-25 DIAGNOSIS — M9903 Segmental and somatic dysfunction of lumbar region: Secondary | ICD-10-CM | POA: Diagnosis not present

## 2018-09-25 DIAGNOSIS — M5033 Other cervical disc degeneration, cervicothoracic region: Secondary | ICD-10-CM | POA: Diagnosis not present

## 2018-09-25 DIAGNOSIS — M9901 Segmental and somatic dysfunction of cervical region: Secondary | ICD-10-CM | POA: Diagnosis not present

## 2018-09-29 ENCOUNTER — Other Ambulatory Visit: Payer: Self-pay | Admitting: Cardiovascular Disease

## 2018-09-29 DIAGNOSIS — I10 Essential (primary) hypertension: Secondary | ICD-10-CM

## 2018-10-02 DIAGNOSIS — M5416 Radiculopathy, lumbar region: Secondary | ICD-10-CM | POA: Diagnosis not present

## 2018-10-02 DIAGNOSIS — M5033 Other cervical disc degeneration, cervicothoracic region: Secondary | ICD-10-CM | POA: Diagnosis not present

## 2018-10-02 DIAGNOSIS — M9903 Segmental and somatic dysfunction of lumbar region: Secondary | ICD-10-CM | POA: Diagnosis not present

## 2018-10-02 DIAGNOSIS — M9901 Segmental and somatic dysfunction of cervical region: Secondary | ICD-10-CM | POA: Diagnosis not present

## 2018-10-03 DIAGNOSIS — M5033 Other cervical disc degeneration, cervicothoracic region: Secondary | ICD-10-CM | POA: Diagnosis not present

## 2018-10-03 DIAGNOSIS — M9903 Segmental and somatic dysfunction of lumbar region: Secondary | ICD-10-CM | POA: Diagnosis not present

## 2018-10-03 DIAGNOSIS — M9901 Segmental and somatic dysfunction of cervical region: Secondary | ICD-10-CM | POA: Diagnosis not present

## 2018-10-03 DIAGNOSIS — M5416 Radiculopathy, lumbar region: Secondary | ICD-10-CM | POA: Diagnosis not present

## 2018-10-07 DIAGNOSIS — M5416 Radiculopathy, lumbar region: Secondary | ICD-10-CM | POA: Diagnosis not present

## 2018-10-07 DIAGNOSIS — M9903 Segmental and somatic dysfunction of lumbar region: Secondary | ICD-10-CM | POA: Diagnosis not present

## 2018-10-07 DIAGNOSIS — M5033 Other cervical disc degeneration, cervicothoracic region: Secondary | ICD-10-CM | POA: Diagnosis not present

## 2018-10-07 DIAGNOSIS — M9901 Segmental and somatic dysfunction of cervical region: Secondary | ICD-10-CM | POA: Diagnosis not present

## 2018-10-09 DIAGNOSIS — M9901 Segmental and somatic dysfunction of cervical region: Secondary | ICD-10-CM | POA: Diagnosis not present

## 2018-10-09 DIAGNOSIS — M5416 Radiculopathy, lumbar region: Secondary | ICD-10-CM | POA: Diagnosis not present

## 2018-10-09 DIAGNOSIS — M9903 Segmental and somatic dysfunction of lumbar region: Secondary | ICD-10-CM | POA: Diagnosis not present

## 2018-10-09 DIAGNOSIS — M5033 Other cervical disc degeneration, cervicothoracic region: Secondary | ICD-10-CM | POA: Diagnosis not present

## 2018-10-14 DIAGNOSIS — M5416 Radiculopathy, lumbar region: Secondary | ICD-10-CM | POA: Diagnosis not present

## 2018-10-14 DIAGNOSIS — M9901 Segmental and somatic dysfunction of cervical region: Secondary | ICD-10-CM | POA: Diagnosis not present

## 2018-10-14 DIAGNOSIS — M9903 Segmental and somatic dysfunction of lumbar region: Secondary | ICD-10-CM | POA: Diagnosis not present

## 2018-10-14 DIAGNOSIS — M5033 Other cervical disc degeneration, cervicothoracic region: Secondary | ICD-10-CM | POA: Diagnosis not present

## 2018-10-16 DIAGNOSIS — M9901 Segmental and somatic dysfunction of cervical region: Secondary | ICD-10-CM | POA: Diagnosis not present

## 2018-10-16 DIAGNOSIS — M5416 Radiculopathy, lumbar region: Secondary | ICD-10-CM | POA: Diagnosis not present

## 2018-10-16 DIAGNOSIS — M9903 Segmental and somatic dysfunction of lumbar region: Secondary | ICD-10-CM | POA: Diagnosis not present

## 2018-10-16 DIAGNOSIS — M5033 Other cervical disc degeneration, cervicothoracic region: Secondary | ICD-10-CM | POA: Diagnosis not present

## 2018-10-21 DIAGNOSIS — M9901 Segmental and somatic dysfunction of cervical region: Secondary | ICD-10-CM | POA: Diagnosis not present

## 2018-10-21 DIAGNOSIS — M9903 Segmental and somatic dysfunction of lumbar region: Secondary | ICD-10-CM | POA: Diagnosis not present

## 2018-10-21 DIAGNOSIS — M5416 Radiculopathy, lumbar region: Secondary | ICD-10-CM | POA: Diagnosis not present

## 2018-10-21 DIAGNOSIS — M5033 Other cervical disc degeneration, cervicothoracic region: Secondary | ICD-10-CM | POA: Diagnosis not present

## 2018-10-23 DIAGNOSIS — M9903 Segmental and somatic dysfunction of lumbar region: Secondary | ICD-10-CM | POA: Diagnosis not present

## 2018-10-23 DIAGNOSIS — M5416 Radiculopathy, lumbar region: Secondary | ICD-10-CM | POA: Diagnosis not present

## 2018-10-23 DIAGNOSIS — M5033 Other cervical disc degeneration, cervicothoracic region: Secondary | ICD-10-CM | POA: Diagnosis not present

## 2018-10-23 DIAGNOSIS — M9901 Segmental and somatic dysfunction of cervical region: Secondary | ICD-10-CM | POA: Diagnosis not present

## 2018-10-30 DIAGNOSIS — M5416 Radiculopathy, lumbar region: Secondary | ICD-10-CM | POA: Diagnosis not present

## 2018-10-30 DIAGNOSIS — M9901 Segmental and somatic dysfunction of cervical region: Secondary | ICD-10-CM | POA: Diagnosis not present

## 2018-10-30 DIAGNOSIS — M5033 Other cervical disc degeneration, cervicothoracic region: Secondary | ICD-10-CM | POA: Diagnosis not present

## 2018-10-30 DIAGNOSIS — M9903 Segmental and somatic dysfunction of lumbar region: Secondary | ICD-10-CM | POA: Diagnosis not present

## 2018-11-06 DIAGNOSIS — M9903 Segmental and somatic dysfunction of lumbar region: Secondary | ICD-10-CM | POA: Diagnosis not present

## 2018-11-06 DIAGNOSIS — M9901 Segmental and somatic dysfunction of cervical region: Secondary | ICD-10-CM | POA: Diagnosis not present

## 2018-11-06 DIAGNOSIS — M5416 Radiculopathy, lumbar region: Secondary | ICD-10-CM | POA: Diagnosis not present

## 2018-11-06 DIAGNOSIS — M5033 Other cervical disc degeneration, cervicothoracic region: Secondary | ICD-10-CM | POA: Diagnosis not present

## 2018-11-11 ENCOUNTER — Other Ambulatory Visit: Payer: Self-pay | Admitting: Family Medicine

## 2018-11-11 ENCOUNTER — Telehealth: Payer: Self-pay | Admitting: Family Medicine

## 2018-11-11 DIAGNOSIS — B372 Candidiasis of skin and nail: Secondary | ICD-10-CM

## 2018-11-11 MED ORDER — FLUCONAZOLE 150 MG PO TABS: 150.0000 mg | ORAL_TABLET | ORAL | 0 refills | Status: DC

## 2018-11-11 NOTE — Telephone Encounter (Signed)
Copied from [place] [phone]. Topic: Quick Communication - Rx Refill/Question >> Nov 11, 2018  9:06 AM Richardo Priest, NT wrote: Medication:  fluconazole (DIFLUCAN) 150 MG tablet  Has the patient contacted their pharmacy? Yes patient states there is no more refills.   Preferred Pharmacy (with phone number or street name):  [id number] [address], [place] [phone] (Phone) [phone] (Fax)  Agent: Please be advised that RX refills may take up to 3 business days. We ask that you follow-up with your pharmacy.

## 2018-11-13 DIAGNOSIS — M9903 Segmental and somatic dysfunction of lumbar region: Secondary | ICD-10-CM | POA: Diagnosis not present

## 2018-11-13 DIAGNOSIS — M5416 Radiculopathy, lumbar region: Secondary | ICD-10-CM | POA: Diagnosis not present

## 2018-11-13 DIAGNOSIS — M5033 Other cervical disc degeneration, cervicothoracic region: Secondary | ICD-10-CM | POA: Diagnosis not present

## 2018-11-13 DIAGNOSIS — M9901 Segmental and somatic dysfunction of cervical region: Secondary | ICD-10-CM | POA: Diagnosis not present

## 2018-11-16 ENCOUNTER — Other Ambulatory Visit: Payer: Self-pay | Admitting: Family Medicine

## 2018-11-16 DIAGNOSIS — W57XXXD Bitten or stung by nonvenomous insect and other nonvenomous arthropods, subsequent encounter: Secondary | ICD-10-CM

## 2018-11-20 DIAGNOSIS — I10 Essential (primary) hypertension: Secondary | ICD-10-CM | POA: Diagnosis not present

## 2018-11-20 DIAGNOSIS — M5033 Other cervical disc degeneration, cervicothoracic region: Secondary | ICD-10-CM | POA: Diagnosis not present

## 2018-11-20 DIAGNOSIS — E1165 Type 2 diabetes mellitus with hyperglycemia: Secondary | ICD-10-CM | POA: Diagnosis not present

## 2018-11-20 DIAGNOSIS — M9901 Segmental and somatic dysfunction of cervical region: Secondary | ICD-10-CM | POA: Diagnosis not present

## 2018-11-20 DIAGNOSIS — E785 Hyperlipidemia, unspecified: Secondary | ICD-10-CM | POA: Diagnosis not present

## 2018-11-20 DIAGNOSIS — M9903 Segmental and somatic dysfunction of lumbar region: Secondary | ICD-10-CM | POA: Diagnosis not present

## 2018-11-20 DIAGNOSIS — E1169 Type 2 diabetes mellitus with other specified complication: Secondary | ICD-10-CM | POA: Diagnosis not present

## 2018-11-20 DIAGNOSIS — E1159 Type 2 diabetes mellitus with other circulatory complications: Secondary | ICD-10-CM | POA: Diagnosis not present

## 2018-11-20 DIAGNOSIS — M5416 Radiculopathy, lumbar region: Secondary | ICD-10-CM | POA: Diagnosis not present

## 2018-11-20 DIAGNOSIS — M810 Age-related osteoporosis without current pathological fracture: Secondary | ICD-10-CM | POA: Diagnosis not present

## 2018-11-20 DIAGNOSIS — Z794 Long term (current) use of insulin: Secondary | ICD-10-CM | POA: Diagnosis not present

## 2018-11-20 DIAGNOSIS — E559 Vitamin D deficiency, unspecified: Secondary | ICD-10-CM | POA: Diagnosis not present

## 2018-11-20 LAB — HEMOGLOBIN A1C: Hemoglobin A1C: 6.5

## 2018-11-20 LAB — LIPID PANEL
Cholesterol: 154 (ref 0–200)
HDL: 71 — AB (ref 35–70)
LDL Cholesterol: 57
Triglycerides: 129 (ref 40–160)

## 2018-11-27 DIAGNOSIS — M9903 Segmental and somatic dysfunction of lumbar region: Secondary | ICD-10-CM | POA: Diagnosis not present

## 2018-11-27 DIAGNOSIS — M9901 Segmental and somatic dysfunction of cervical region: Secondary | ICD-10-CM | POA: Diagnosis not present

## 2018-11-27 DIAGNOSIS — M5416 Radiculopathy, lumbar region: Secondary | ICD-10-CM | POA: Diagnosis not present

## 2018-11-27 DIAGNOSIS — M5033 Other cervical disc degeneration, cervicothoracic region: Secondary | ICD-10-CM | POA: Diagnosis not present

## 2018-12-04 DIAGNOSIS — M5416 Radiculopathy, lumbar region: Secondary | ICD-10-CM | POA: Diagnosis not present

## 2018-12-04 DIAGNOSIS — M9903 Segmental and somatic dysfunction of lumbar region: Secondary | ICD-10-CM | POA: Diagnosis not present

## 2018-12-04 DIAGNOSIS — M9901 Segmental and somatic dysfunction of cervical region: Secondary | ICD-10-CM | POA: Diagnosis not present

## 2018-12-04 DIAGNOSIS — M5033 Other cervical disc degeneration, cervicothoracic region: Secondary | ICD-10-CM | POA: Diagnosis not present

## 2018-12-06 ENCOUNTER — Telehealth: Payer: Self-pay | Admitting: Family Medicine

## 2018-12-06 DIAGNOSIS — E113299 Type 2 diabetes mellitus with mild nonproliferative diabetic retinopathy without macular edema, unspecified eye: Secondary | ICD-10-CM

## 2018-12-06 DIAGNOSIS — IMO0002 Reserved for concepts with insufficient information to code with codable children: Secondary | ICD-10-CM

## 2018-12-06 DIAGNOSIS — I10 Essential (primary) hypertension: Secondary | ICD-10-CM

## 2018-12-08 NOTE — Telephone Encounter (Signed)
Patient says she uses 4 to 5 pin needles per day and the script approved today is incorrect. Says she usually gets 4 boxes. Please advise.

## 2018-12-09 NOTE — Telephone Encounter (Signed)
Called patient, patient aware.

## 2018-12-11 DIAGNOSIS — M9903 Segmental and somatic dysfunction of lumbar region: Secondary | ICD-10-CM | POA: Diagnosis not present

## 2018-12-11 DIAGNOSIS — M5033 Other cervical disc degeneration, cervicothoracic region: Secondary | ICD-10-CM | POA: Diagnosis not present

## 2018-12-11 DIAGNOSIS — M9901 Segmental and somatic dysfunction of cervical region: Secondary | ICD-10-CM | POA: Diagnosis not present

## 2018-12-11 DIAGNOSIS — M5416 Radiculopathy, lumbar region: Secondary | ICD-10-CM | POA: Diagnosis not present

## 2018-12-16 NOTE — Telephone Encounter (Signed)
Patient called to request that Dr. Ancil Boozer approve the script for the 4 boxes.  Patient would like for the nurse to call her to explain why the doctor cannot approve the script.  Please call patient at [phone]

## 2018-12-16 NOTE — Telephone Encounter (Signed)
I called and explained to patient why you couldn't do 4 boxes. She wonders if she should get Honor Junes to do the Lantus for her as well. I told her that was up to her. She will O'Connell's office a call and see if she can get rx from him.

## 2018-12-18 DIAGNOSIS — M5033 Other cervical disc degeneration, cervicothoracic region: Secondary | ICD-10-CM | POA: Diagnosis not present

## 2018-12-18 DIAGNOSIS — M9901 Segmental and somatic dysfunction of cervical region: Secondary | ICD-10-CM | POA: Diagnosis not present

## 2018-12-18 DIAGNOSIS — M5416 Radiculopathy, lumbar region: Secondary | ICD-10-CM | POA: Diagnosis not present

## 2018-12-18 DIAGNOSIS — M9903 Segmental and somatic dysfunction of lumbar region: Secondary | ICD-10-CM | POA: Diagnosis not present

## 2018-12-19 DIAGNOSIS — M5033 Other cervical disc degeneration, cervicothoracic region: Secondary | ICD-10-CM | POA: Diagnosis not present

## 2018-12-19 DIAGNOSIS — M5416 Radiculopathy, lumbar region: Secondary | ICD-10-CM | POA: Diagnosis not present

## 2018-12-19 DIAGNOSIS — M9901 Segmental and somatic dysfunction of cervical region: Secondary | ICD-10-CM | POA: Diagnosis not present

## 2018-12-19 DIAGNOSIS — M9903 Segmental and somatic dysfunction of lumbar region: Secondary | ICD-10-CM | POA: Diagnosis not present

## 2018-12-25 DIAGNOSIS — M9903 Segmental and somatic dysfunction of lumbar region: Secondary | ICD-10-CM | POA: Diagnosis not present

## 2018-12-25 DIAGNOSIS — M9901 Segmental and somatic dysfunction of cervical region: Secondary | ICD-10-CM | POA: Diagnosis not present

## 2018-12-25 DIAGNOSIS — M5416 Radiculopathy, lumbar region: Secondary | ICD-10-CM | POA: Diagnosis not present

## 2018-12-25 DIAGNOSIS — M5033 Other cervical disc degeneration, cervicothoracic region: Secondary | ICD-10-CM | POA: Diagnosis not present

## 2019-01-01 DIAGNOSIS — M5416 Radiculopathy, lumbar region: Secondary | ICD-10-CM | POA: Diagnosis not present

## 2019-01-01 DIAGNOSIS — M9901 Segmental and somatic dysfunction of cervical region: Secondary | ICD-10-CM | POA: Diagnosis not present

## 2019-01-01 DIAGNOSIS — M9903 Segmental and somatic dysfunction of lumbar region: Secondary | ICD-10-CM | POA: Diagnosis not present

## 2019-01-01 DIAGNOSIS — M5033 Other cervical disc degeneration, cervicothoracic region: Secondary | ICD-10-CM | POA: Diagnosis not present

## 2019-01-07 NOTE — Progress Notes (Signed)
poc

## 2019-01-08 ENCOUNTER — Ambulatory Visit (INDEPENDENT_AMBULATORY_CARE_PROVIDER_SITE_OTHER): Payer: Medicare Other | Admitting: Family Medicine

## 2019-01-08 ENCOUNTER — Other Ambulatory Visit: Payer: Self-pay

## 2019-01-08 ENCOUNTER — Encounter: Payer: Self-pay | Admitting: Family Medicine

## 2019-01-08 VITALS — BP 170/64 | HR 72 | Temp 97.5°F | Resp 16 | Ht 63.0 in | Wt 208.1 lb

## 2019-01-08 DIAGNOSIS — R600 Localized edema: Secondary | ICD-10-CM

## 2019-01-08 DIAGNOSIS — E1169 Type 2 diabetes mellitus with other specified complication: Secondary | ICD-10-CM

## 2019-01-08 DIAGNOSIS — E113293 Type 2 diabetes mellitus with mild nonproliferative diabetic retinopathy without macular edema, bilateral: Secondary | ICD-10-CM | POA: Diagnosis not present

## 2019-01-08 DIAGNOSIS — I10 Essential (primary) hypertension: Secondary | ICD-10-CM

## 2019-01-08 DIAGNOSIS — W57XXXD Bitten or stung by nonvenomous insect and other nonvenomous arthropods, subsequent encounter: Secondary | ICD-10-CM | POA: Diagnosis not present

## 2019-01-08 DIAGNOSIS — D692 Other nonthrombocytopenic purpura: Secondary | ICD-10-CM | POA: Diagnosis not present

## 2019-01-08 DIAGNOSIS — M810 Age-related osteoporosis without current pathological fracture: Secondary | ICD-10-CM | POA: Diagnosis not present

## 2019-01-08 DIAGNOSIS — E669 Obesity, unspecified: Secondary | ICD-10-CM | POA: Diagnosis not present

## 2019-01-08 DIAGNOSIS — B372 Candidiasis of skin and nail: Secondary | ICD-10-CM | POA: Diagnosis not present

## 2019-01-08 DIAGNOSIS — E785 Hyperlipidemia, unspecified: Secondary | ICD-10-CM

## 2019-01-08 DIAGNOSIS — I701 Atherosclerosis of renal artery: Secondary | ICD-10-CM | POA: Diagnosis not present

## 2019-01-08 DIAGNOSIS — I700 Atherosclerosis of aorta: Secondary | ICD-10-CM

## 2019-01-08 LAB — HM DIABETES EYE EXAM

## 2019-01-08 MED ORDER — FLUCONAZOLE 150 MG PO TABS: 150.0000 mg | ORAL_TABLET | ORAL | 0 refills | Status: DC

## 2019-01-08 MED ORDER — CLOBETASOL PROPIONATE 0.05 % EX OINT: 1.0000 "application " | TOPICAL_OINTMENT | Freq: Two times a day (BID) | CUTANEOUS | 0 refills | Status: DC | PRN

## 2019-01-08 MED ORDER — MEDICAL COMPRESSION STOCKINGS MISC: 1.0000 | Freq: Every day | 5 refills | Status: AC

## 2019-01-08 MED ORDER — ROSUVASTATIN CALCIUM 10 MG PO TABS: 10.0000 mg | ORAL_TABLET | Freq: Every day | ORAL | 1 refills | Status: DC

## 2019-01-08 NOTE — Progress Notes (Signed)
Name: Bonnie Rowland   MRN: [id number]    DOB: 24-Aug-1940   Date:01/08/2019       Progress Note  Subjective  Chief Complaint  Chief Complaint  Patient presents with  . Hypertension  . Hyperlipidemia    HPI  Osteoporosis: she was non Evista for years but bone density got worse and has been getting Reclast from  Dr. Manfred Shirts and is now on Reclast since  June 7th, 2019, she is going back next week for another round.   DM: seeing Endocrinologist- Dr. Tawanna Solo to date with eye exam, foot exam and hgbA1C done was 6.5 % May 2020 and only having mild hypoglycemic episodes usually in am's when she has a light supper. She is on Bydureon, pre-meal insulin and basal insulin, she  She states not as consistent with diet over the holidays . No polyphagia, polydipsia or polyuria. She has a follow up with eye doctor today   HTN: BP is elevated again today, she states at home bp has been 130-140's/60-70's, however she just got her house on the market last night, and has 3 showings this am, she is anxious. BP is high today. She sees Dr. Rockey Situ . No  chest pain, palpitation, no decrease in exercise tolerance. She denies dizziness.BP at Dr. Rockey Situ bp was at goal   Vaginal pain: shesawUrologist for evaluation of supra pubic pain and studies have been negative, she is using estradiol and is doing better now.   Atherosclerosis of aorta: on statin therapy and also aspirin. Last LDL had gone up, but she is now taking Crestor more consistently, we will recheck it today   Senile purpura: both arms and hands, stable.   Morbid Obesity: BMI above 35 with co-morbidities. Weight is stable, gained only 2 lbs in the past 6 months.   Patient Active Problem List   Diagnosis Date Noted  . Age-related osteoporosis without current pathological fracture 11/05/2017  . 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([place]) 07/09/2017  . Atherosclerosis of abdominal aorta ([place]) 07/09/2017  . Bilateral carotid bruits 08/24/2015  . 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 Atrial flutter ([place]) 02/22/2015  . Chronic pain 01/10/2015  . 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 Fatigue 01/10/2015  . History of melanoma excision 01/10/2015  . Morbid obesity ([place]) 01/10/2015  . 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 Spinal stenosis, lumbar region, 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  . DDD (degenerative disc disease), lumbar 11/14/2014  . Greater trochanteric bursitis of both hips 11/14/2014  . Piriformis syndrome 11/14/2014  . 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 Hyperlipemia 11/07/2009  . Hypertension, benign 11/07/2009  . Atherosclerosis of renal artery ([place]) 11/07/2009  . Hyperlipidemia due to type 2 diabetes mellitus ([place]) 11/07/2009  . Perennial allergic rhinitis 11/27/2007  . Lumbosacral neuritis 01/17/2007    Past Surgical History:  Procedure Laterality Date  . APPENDECTOMY    . CARDIAC CATHETERIZATION    . cataract surgery    . COLONOSCOPY    . COLONOSCOPY WITH PROPOFOL N/A 05/09/2016   Procedure: COLONOSCOPY WITH PROPOFOL;  Surgeon: Robert Bellow, MD;  Location: [facility] ENDOSCOPY;  Service: Endoscopy;  Laterality: N/A;  . DIAGNOSTIC MAMMOGRAM    . EYE SURGERY Bilateral    Cataract Extraction  . KNEE ARTHROPLASTY Right 03/30/2015   Procedure: COMPUTER ASSISTED TOTAL KNEE ARTHROPLASTY;  Surgeon: Dereck Leep, MD;  Location: [facility] ORS;  Service: Orthopedics;  Laterality: Right;  . KNEE ARTHROSCOPY  Right   . KNEE CLOSED REDUCTION Right 05/23/2015   Procedure: CLOSED MANIPULATION KNEE;  Surgeon: Dereck Leep, MD;  Location: [facility] ORS;  Service: Orthopedics;  Laterality: Right;  . TONSILLECTOMY      Family History  Problem Relation Age of Onset  . Diabetes Mother   . Hypertension Mother   . Cancer Mother   . Hypertension Father   . Cancer Father        Prostate  . Breast cancer Maternal Aunt 2  . Colon  cancer Son   . Kidney cancer Neg Hx   . Bladder Cancer Neg Hx     Social History   Socioeconomic History  . Marital status: Married    Spouse name: Emergency planning/management officer  . Number of children: 1  . Years of education: Not on file  . Highest education level: Master's degree (e.g., MA, MS, MEng, MEd, MSW, MBA)  Occupational History  . Occupation: Retired  Scientific laboratory technician  . Financial resource strain: Not hard at all  . Food insecurity    Worry: Never true    Inability: Never true  . Transportation needs    Medical: No    Non-medical: No  Tobacco Use  . Smoking status: Never Smoker  . Smokeless tobacco: Never Used  Substance and Sexual Activity  . Alcohol use: Not Currently    Alcohol/week: 0.0 standard drinks  . Drug use: No  . Sexual activity: Yes    Partners: Male  Lifestyle  . Physical activity    Days per week: 3 days    Minutes per session: 40 min  . Stress: Not at all  Relationships  . Social connections    Talks on phone: More than three times a week    Gets together: More than three times a week    Attends religious service: Never    Active member of club or organization: Yes    Attends meetings of clubs or organizations: More than 4 times per year    Relationship status: Married  . Intimate partner violence    Fear of current or ex partner: No    Emotionally abused: No    Physically abused: No    Forced sexual activity: No  Other Topics Concern  . Not on file  Social History Narrative   She is married , only has one son and he was diagnosed with colon cancer at age 67.      Current Outpatient Medications:  .  ALFALFA PO, Take 3 tablets by mouth every morning., Disp: , Rfl:  .  aspirin 81 MG EC tablet, Take 81 mg by mouth at bedtime. , Disp: , Rfl:  .  B Complex-C (B-COMPLEX WITH VITAMIN C) tablet, Take 2 tablets by mouth at bedtime. , Disp: , Rfl:  .  BD PEN NEEDLE NANO U/F 32G X 4 MM MISC, USE 4 TIMES DAILY (HUMALOG 3 TIMES DAILY AND LANTUS ONCE DAILY) OFFICE  NOTIFIED 08/06/17, Disp: 90 each, Rfl: 1 .  BLACK ELDERBERRY,BERRY-FLOWER, PO, Take by mouth., Disp: , Rfl:  .  calcium carbonate (OS-CAL) 600 MG TABS tablet, Take 600 mg by mouth daily., Disp: , Rfl:  .  cholecalciferol (VITAMIN D) 1000 UNITS tablet, Take 4,000 Units by mouth daily. , Disp: , Rfl:  .  clobetasol ointment (TEMOVATE) 2.68 %, Apply 1 application topically 2 (two) times daily as needed (bug bites). Reported on 06/30/2015, Disp: 30 g, Rfl: 0 .  Coenzyme Q10 (COQ10) 200 MG CAPS, Take 1 tablet by  mouth daily. 400 mg every am., Disp: , Rfl:  .  Continuous Blood Gluc Sensor ([place]) MISC, by Does not apply route., Disp: , Rfl:  .  estradiol (ESTRACE VAGINAL) 0.1 MG/GM vaginal cream, 1/2 gm once weekly using applicator, apply blueberry sized amount of cream using tip of finger to urethra twice weekly, Disp: 50 g, Rfl: 4 .  Exenatide ER 2 MG SRER, Inject 2 mg into the skin once a week., Disp: 12 each, Rfl: 0 .  fish oil-omega-3 fatty acids 1000 MG capsule, Take 2 g by mouth daily.  , Disp: , Rfl:  .  fluconazole (DIFLUCAN) 150 MG tablet, Take 1 tablet (150 mg total) by mouth every other day., Disp: 3 tablet, Rfl: 0 .  fluticasone (FLONASE) 50 MCG/ACT nasal spray, PLACE 2 SPRAYS INTO BOTH NOSTRILS DAILY., Disp: 8.5 g, Rfl: 2 .  GARLIC 7001 PO, Take 1 tablet by mouth daily. 1 gram, Disp: , Rfl:  .  Glucosamine 500 MG TABS, Take 1 tablet by mouth daily. , Disp: , Rfl:  .  HUMALOG KWIKPEN 100 UNIT/ML KiwkPen, Inject 6 Units into the skin 3 (three) times daily after meals., Disp: , Rfl: 3 .  Insulin Glargine (LANTUS SOLOSTAR) 100 UNIT/ML Solostar Pen, Inject 24 Units into the skin every morning., Disp: 15 pen, Rfl: 2 .  losartan-hydrochlorothiazide (HYZAAR) 100-25 MG tablet, TAKE 1 TABLET BY MOUTH EVERY DAY, Disp: 90 tablet, Rfl: 1 .  Mag Aspart-Potassium Aspart (POTASSIUM & MAGNESIUM ASPARTAT PO), Take 1 tablet by mouth daily., Disp: , Rfl:  .  metoprolol succinate  (TOPROL-XL) 25 MG 24 hr tablet, TAKE 1 TABLET BY MOUTH EVERY DAY, Disp: 90 tablet, Rfl: 2 .  Multiple Vitamins-Minerals (PRESERVISION AREDS 2) CAPS, Take 1 capsule by mouth 2 (two) times daily., Disp: , Rfl:  .  nystatin cream (MYCOSTATIN), Apply 1 application topically 2 (two) times daily., Disp: 60 g, Rfl: 2 .  Probiotic Product (PROBIOTIC PO), Take 1 Dose by mouth at bedtime., Disp: , Rfl:  .  Propylene Glycol (SYSTANE BALANCE OP), Apply 1 drop to eye as needed., Disp: , Rfl:  .  rosuvastatin (CRESTOR) 10 MG tablet, Take 1 tablet (10 mg total) by mouth daily., Disp: 90 tablet, Rfl: 1 .  sodium fluoride (DENTAGEL) 1.1 % GEL dental gel, , Disp: , Rfl:  .  sulfacetamide (BLEPH-10) 10 % ophthalmic solution, Place 1 drop into both eyes 3 (three) times daily as needed., Disp: , Rfl:  .  Vitamin D, Ergocalciferol, (DRISDOL) 1.25 MG (50000 UT) CAPS capsule, Take 1 capsule by mouth once a week., Disp: , Rfl:  .  vitamin E (VITAMIN E) 400 UNIT capsule, Take 400 Units by mouth daily. Every am, Disp: , Rfl:  .  Zoledronic Acid (RECLAST IV), Inject into the vein. Every year, Disp: , Rfl:  .  Elastic Bandages & Supports (Alpha) MISC, 1 each by Does not apply route daily., Disp: 2 each, Rfl: 5 .  Eyelid Cleansers (AVENOVA) 0.01 % SOLN, See admin instructions., Disp: , Rfl: 3  Allergies  Allergen Reactions  . Cyclobenzaprine Hypertension  . Cheese Other (See Comments)    bloating  . Ciprofloxacin Hcl Other (See Comments)    Muscle pain  . Coconut Oil   . Keflex [Cephalexin] Other (See Comments)    Patient prefers not to take this medication due to the side effects    I personally reviewed active problem list, medication list, allergies, family history, social history with the  patient/caregiver today.   ROS  Constitutional: Negative for fever or weight change.  Respiratory: Negative for cough and shortness of breath.   Cardiovascular: Negative for chest pain or  palpitations.  Gastrointestinal: Negative for abdominal pain, no bowel changes.  Musculoskeletal: Negative for gait problem or joint swelling.  Skin: Negative for rash.  Neurological: Negative for dizziness or headache.  No other specific complaints in a complete review of systems (except as listed in HPI above).  Objective  Vitals:   01/08/19 0826  BP: (!) 160/62  Pulse: 72  Resp: 16  Temp: (!) 97.5 F (36.4 C)  TempSrc: Oral  SpO2: 95%  Weight: 208 lb 1.6 oz (94.4 kg)  Height: 5\' 3"  (1.6 m)    Body mass index is 36.86 kg/m.  Physical Exam  Constitutional: Patient appears well-developed and well-nourished. Obese  No distress.  HEENT: head atraumatic, normocephalic, pupils equal and reactive to light, neck supple Cardiovascular: Normal rate, regular rhythm and normal heart sounds.  No murmur heard. Trace  BLE edema. Pulmonary/Chest: Effort normal and breath sounds normal. No respiratory distress. Abdominal: Soft.  There is no tenderness. Psychiatric: Patient has a normal mood and affect. behavior is normal. Judgment and thought content normal.  PHQ2/9: Depression screen [facility] 2/9 01/08/2019 07/10/2018 04/11/2018 01/06/2018 08/22/2017  Decreased Interest 0 0 0 0 0  Down, Depressed, Hopeless 0 0 0 0 0  PHQ - 2 Score 0 0 0 0 0  Altered sleeping 0 - - - -  Tired, decreased energy 0 - - - -  Change in appetite 0 - - - -  Feeling bad or failure about yourself  0 - - - -  Trouble concentrating 0 - - - -  Moving slowly or fidgety/restless 0 - - - -  Suicidal thoughts 0 - - - -  PHQ-9 Score 0 - - - -    phq 9 is negative   Fall Risk: Fall Risk  01/08/2019 07/10/2018 04/11/2018 01/06/2018 08/22/2017  Falls in the past year? 0 0 No No No  Comment - - - - -  Number falls in past yr: 0 0 - - -  Injury with Fall? 0 0 - - -  Risk for fall due to : - - - - -  Risk for fall due to: Comment - - - - -    Assessment & Plan  1. Diabetes mellitus type 2 in obese [facility])  Continue follow up  with Dr. Honor Junes   2. Bilateral lower extremity edema  - Elastic Bandages & Supports (MEDICAL COMPRESSION STOCKINGS) MISC; 1 each by Does not apply route daily.  Dispense: 2 each; Refill: 5  3. 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([place])   4. Atherosclerosis of renal artery (HCC)  - rosuvastatin (CRESTOR) 10 MG tablet; Take 1 tablet (10 mg total) by mouth daily.  Dispense: 90 tablet; Refill: 1  5. Dyslipidemia associated with type 2 diabetes mellitus (HCC)  - rosuvastatin (CRESTOR) 10 MG tablet; Take 1 tablet (10 mg total) by mouth daily.  Dispense: 90 tablet; Refill: 1  6. Essential hypertension  BP is up but usually at goal, she is anxious about selling her house, at goal at home   7. Osteoporosis, post-menopausal   8. Morbid obesity ([place])  Discussed with the patient the risk posed by an increased BMI. Discussed importance of portion control, calorie counting and at least 150 minutes of physical activity weekly. Avoid sweet beverages and drink more water. Eat at least 6 servings of fruit and  vegetables daily   9. Atherosclerosis of abdominal aorta (HCC)  On statin and aspirin   10. Bug bite, subsequent encounter  - clobetasol ointment (TEMOVATE) 0.05 %; Apply 1 application topically 2 (two) times daily as needed (bug bites). Reported on 06/30/2015  Dispense: 30 g; Refill: 0  11. Candida infection of flexural skin  - fluconazole (DIFLUCAN) 150 MG tablet; Take 1 tablet (150 mg total) by mouth every other day.  Dispense: 3 tablet; Refill: 0

## 2019-01-09 DIAGNOSIS — M9901 Segmental and somatic dysfunction of cervical region: Secondary | ICD-10-CM | POA: Diagnosis not present

## 2019-01-09 DIAGNOSIS — M5033 Other cervical disc degeneration, cervicothoracic region: Secondary | ICD-10-CM | POA: Diagnosis not present

## 2019-01-09 DIAGNOSIS — M9903 Segmental and somatic dysfunction of lumbar region: Secondary | ICD-10-CM | POA: Diagnosis not present

## 2019-01-09 DIAGNOSIS — M5416 Radiculopathy, lumbar region: Secondary | ICD-10-CM | POA: Diagnosis not present

## 2019-01-12 ENCOUNTER — Encounter: Payer: Self-pay | Admitting: Family Medicine

## 2019-01-14 DIAGNOSIS — M810 Age-related osteoporosis without current pathological fracture: Secondary | ICD-10-CM | POA: Diagnosis not present

## 2019-01-15 DIAGNOSIS — M5416 Radiculopathy, lumbar region: Secondary | ICD-10-CM | POA: Diagnosis not present

## 2019-01-15 DIAGNOSIS — M9901 Segmental and somatic dysfunction of cervical region: Secondary | ICD-10-CM | POA: Diagnosis not present

## 2019-01-15 DIAGNOSIS — M9903 Segmental and somatic dysfunction of lumbar region: Secondary | ICD-10-CM | POA: Diagnosis not present

## 2019-01-15 DIAGNOSIS — M5033 Other cervical disc degeneration, cervicothoracic region: Secondary | ICD-10-CM | POA: Diagnosis not present

## 2019-01-22 DIAGNOSIS — M5416 Radiculopathy, lumbar region: Secondary | ICD-10-CM | POA: Diagnosis not present

## 2019-01-22 DIAGNOSIS — M9903 Segmental and somatic dysfunction of lumbar region: Secondary | ICD-10-CM | POA: Diagnosis not present

## 2019-01-22 DIAGNOSIS — M9901 Segmental and somatic dysfunction of cervical region: Secondary | ICD-10-CM | POA: Diagnosis not present

## 2019-01-22 DIAGNOSIS — M5033 Other cervical disc degeneration, cervicothoracic region: Secondary | ICD-10-CM | POA: Diagnosis not present

## 2019-01-29 DIAGNOSIS — M9901 Segmental and somatic dysfunction of cervical region: Secondary | ICD-10-CM | POA: Diagnosis not present

## 2019-01-29 DIAGNOSIS — M5033 Other cervical disc degeneration, cervicothoracic region: Secondary | ICD-10-CM | POA: Diagnosis not present

## 2019-01-29 DIAGNOSIS — M9903 Segmental and somatic dysfunction of lumbar region: Secondary | ICD-10-CM | POA: Diagnosis not present

## 2019-01-29 DIAGNOSIS — M5416 Radiculopathy, lumbar region: Secondary | ICD-10-CM | POA: Diagnosis not present

## 2019-02-05 DIAGNOSIS — M9901 Segmental and somatic dysfunction of cervical region: Secondary | ICD-10-CM | POA: Diagnosis not present

## 2019-02-05 DIAGNOSIS — M5033 Other cervical disc degeneration, cervicothoracic region: Secondary | ICD-10-CM | POA: Diagnosis not present

## 2019-02-05 DIAGNOSIS — M9903 Segmental and somatic dysfunction of lumbar region: Secondary | ICD-10-CM | POA: Diagnosis not present

## 2019-02-05 DIAGNOSIS — M5416 Radiculopathy, lumbar region: Secondary | ICD-10-CM | POA: Diagnosis not present

## 2019-02-12 DIAGNOSIS — M9903 Segmental and somatic dysfunction of lumbar region: Secondary | ICD-10-CM | POA: Diagnosis not present

## 2019-02-12 DIAGNOSIS — M9901 Segmental and somatic dysfunction of cervical region: Secondary | ICD-10-CM | POA: Diagnosis not present

## 2019-02-12 DIAGNOSIS — M5416 Radiculopathy, lumbar region: Secondary | ICD-10-CM | POA: Diagnosis not present

## 2019-02-12 DIAGNOSIS — M5033 Other cervical disc degeneration, cervicothoracic region: Secondary | ICD-10-CM | POA: Diagnosis not present

## 2019-02-19 DIAGNOSIS — M9901 Segmental and somatic dysfunction of cervical region: Secondary | ICD-10-CM | POA: Diagnosis not present

## 2019-02-19 DIAGNOSIS — M5033 Other cervical disc degeneration, cervicothoracic region: Secondary | ICD-10-CM | POA: Diagnosis not present

## 2019-02-19 DIAGNOSIS — M5416 Radiculopathy, lumbar region: Secondary | ICD-10-CM | POA: Diagnosis not present

## 2019-02-19 DIAGNOSIS — M9903 Segmental and somatic dysfunction of lumbar region: Secondary | ICD-10-CM | POA: Diagnosis not present

## 2019-03-12 DIAGNOSIS — M9901 Segmental and somatic dysfunction of cervical region: Secondary | ICD-10-CM | POA: Diagnosis not present

## 2019-03-12 DIAGNOSIS — M5416 Radiculopathy, lumbar region: Secondary | ICD-10-CM | POA: Diagnosis not present

## 2019-03-12 DIAGNOSIS — M9903 Segmental and somatic dysfunction of lumbar region: Secondary | ICD-10-CM | POA: Diagnosis not present

## 2019-03-12 DIAGNOSIS — M5033 Other cervical disc degeneration, cervicothoracic region: Secondary | ICD-10-CM | POA: Diagnosis not present

## 2019-03-13 DIAGNOSIS — L7211 Pilar cyst: Secondary | ICD-10-CM | POA: Diagnosis not present

## 2019-03-13 DIAGNOSIS — L570 Actinic keratosis: Secondary | ICD-10-CM | POA: Diagnosis not present

## 2019-03-13 DIAGNOSIS — D2271 Melanocytic nevi of right lower limb, including hip: Secondary | ICD-10-CM | POA: Diagnosis not present

## 2019-03-13 DIAGNOSIS — L538 Other specified erythematous conditions: Secondary | ICD-10-CM | POA: Diagnosis not present

## 2019-03-13 DIAGNOSIS — D2262 Melanocytic nevi of left upper limb, including shoulder: Secondary | ICD-10-CM | POA: Diagnosis not present

## 2019-03-13 DIAGNOSIS — Z85820 Personal history of malignant melanoma of skin: Secondary | ICD-10-CM | POA: Diagnosis not present

## 2019-03-13 DIAGNOSIS — M5416 Radiculopathy, lumbar region: Secondary | ICD-10-CM | POA: Diagnosis not present

## 2019-03-13 DIAGNOSIS — L820 Inflamed seborrheic keratosis: Secondary | ICD-10-CM | POA: Diagnosis not present

## 2019-03-13 DIAGNOSIS — X32XXXA Exposure to sunlight, initial encounter: Secondary | ICD-10-CM | POA: Diagnosis not present

## 2019-03-13 DIAGNOSIS — Z85828 Personal history of other malignant neoplasm of skin: Secondary | ICD-10-CM | POA: Diagnosis not present

## 2019-03-13 DIAGNOSIS — M9903 Segmental and somatic dysfunction of lumbar region: Secondary | ICD-10-CM | POA: Diagnosis not present

## 2019-03-13 DIAGNOSIS — R208 Other disturbances of skin sensation: Secondary | ICD-10-CM | POA: Diagnosis not present

## 2019-03-13 DIAGNOSIS — M9901 Segmental and somatic dysfunction of cervical region: Secondary | ICD-10-CM | POA: Diagnosis not present

## 2019-03-13 DIAGNOSIS — M5033 Other cervical disc degeneration, cervicothoracic region: Secondary | ICD-10-CM | POA: Diagnosis not present

## 2019-03-16 DIAGNOSIS — M9901 Segmental and somatic dysfunction of cervical region: Secondary | ICD-10-CM | POA: Diagnosis not present

## 2019-03-16 DIAGNOSIS — M9903 Segmental and somatic dysfunction of lumbar region: Secondary | ICD-10-CM | POA: Diagnosis not present

## 2019-03-16 DIAGNOSIS — M5033 Other cervical disc degeneration, cervicothoracic region: Secondary | ICD-10-CM | POA: Diagnosis not present

## 2019-03-16 DIAGNOSIS — M5416 Radiculopathy, lumbar region: Secondary | ICD-10-CM | POA: Diagnosis not present

## 2019-03-17 DIAGNOSIS — M9901 Segmental and somatic dysfunction of cervical region: Secondary | ICD-10-CM | POA: Diagnosis not present

## 2019-03-17 DIAGNOSIS — M5416 Radiculopathy, lumbar region: Secondary | ICD-10-CM | POA: Diagnosis not present

## 2019-03-17 DIAGNOSIS — M5033 Other cervical disc degeneration, cervicothoracic region: Secondary | ICD-10-CM | POA: Diagnosis not present

## 2019-03-17 DIAGNOSIS — M9903 Segmental and somatic dysfunction of lumbar region: Secondary | ICD-10-CM | POA: Diagnosis not present

## 2019-03-18 DIAGNOSIS — M9901 Segmental and somatic dysfunction of cervical region: Secondary | ICD-10-CM | POA: Diagnosis not present

## 2019-03-18 DIAGNOSIS — M9903 Segmental and somatic dysfunction of lumbar region: Secondary | ICD-10-CM | POA: Diagnosis not present

## 2019-03-18 DIAGNOSIS — M5416 Radiculopathy, lumbar region: Secondary | ICD-10-CM | POA: Diagnosis not present

## 2019-03-18 DIAGNOSIS — M5033 Other cervical disc degeneration, cervicothoracic region: Secondary | ICD-10-CM | POA: Diagnosis not present

## 2019-03-19 ENCOUNTER — Ambulatory Visit (INDEPENDENT_AMBULATORY_CARE_PROVIDER_SITE_OTHER): Payer: Medicare Other

## 2019-03-19 ENCOUNTER — Other Ambulatory Visit: Payer: Self-pay

## 2019-03-19 DIAGNOSIS — Z23 Encounter for immunization: Secondary | ICD-10-CM

## 2019-03-19 DIAGNOSIS — M5033 Other cervical disc degeneration, cervicothoracic region: Secondary | ICD-10-CM | POA: Diagnosis not present

## 2019-03-19 DIAGNOSIS — M5416 Radiculopathy, lumbar region: Secondary | ICD-10-CM | POA: Diagnosis not present

## 2019-03-19 DIAGNOSIS — M9901 Segmental and somatic dysfunction of cervical region: Secondary | ICD-10-CM | POA: Diagnosis not present

## 2019-03-19 DIAGNOSIS — M9903 Segmental and somatic dysfunction of lumbar region: Secondary | ICD-10-CM | POA: Diagnosis not present

## 2019-03-20 ENCOUNTER — Other Ambulatory Visit: Payer: Self-pay | Admitting: Family Medicine

## 2019-03-20 DIAGNOSIS — W57XXXD Bitten or stung by nonvenomous insect and other nonvenomous arthropods, subsequent encounter: Secondary | ICD-10-CM

## 2019-03-20 DIAGNOSIS — M5416 Radiculopathy, lumbar region: Secondary | ICD-10-CM | POA: Diagnosis not present

## 2019-03-20 DIAGNOSIS — M5033 Other cervical disc degeneration, cervicothoracic region: Secondary | ICD-10-CM | POA: Diagnosis not present

## 2019-03-20 DIAGNOSIS — M9901 Segmental and somatic dysfunction of cervical region: Secondary | ICD-10-CM | POA: Diagnosis not present

## 2019-03-20 DIAGNOSIS — M9903 Segmental and somatic dysfunction of lumbar region: Secondary | ICD-10-CM | POA: Diagnosis not present

## 2019-03-23 DIAGNOSIS — M5033 Other cervical disc degeneration, cervicothoracic region: Secondary | ICD-10-CM | POA: Diagnosis not present

## 2019-03-23 DIAGNOSIS — M5416 Radiculopathy, lumbar region: Secondary | ICD-10-CM | POA: Diagnosis not present

## 2019-03-23 DIAGNOSIS — M9903 Segmental and somatic dysfunction of lumbar region: Secondary | ICD-10-CM | POA: Diagnosis not present

## 2019-03-23 DIAGNOSIS — M9901 Segmental and somatic dysfunction of cervical region: Secondary | ICD-10-CM | POA: Diagnosis not present

## 2019-03-24 DIAGNOSIS — M5416 Radiculopathy, lumbar region: Secondary | ICD-10-CM | POA: Diagnosis not present

## 2019-03-24 DIAGNOSIS — M9901 Segmental and somatic dysfunction of cervical region: Secondary | ICD-10-CM | POA: Diagnosis not present

## 2019-03-24 DIAGNOSIS — M9903 Segmental and somatic dysfunction of lumbar region: Secondary | ICD-10-CM | POA: Diagnosis not present

## 2019-03-24 DIAGNOSIS — M5033 Other cervical disc degeneration, cervicothoracic region: Secondary | ICD-10-CM | POA: Diagnosis not present

## 2019-03-26 DIAGNOSIS — M5033 Other cervical disc degeneration, cervicothoracic region: Secondary | ICD-10-CM | POA: Diagnosis not present

## 2019-03-26 DIAGNOSIS — M9903 Segmental and somatic dysfunction of lumbar region: Secondary | ICD-10-CM | POA: Diagnosis not present

## 2019-03-26 DIAGNOSIS — M9901 Segmental and somatic dysfunction of cervical region: Secondary | ICD-10-CM | POA: Diagnosis not present

## 2019-03-26 DIAGNOSIS — M5416 Radiculopathy, lumbar region: Secondary | ICD-10-CM | POA: Diagnosis not present

## 2019-03-31 DIAGNOSIS — M5033 Other cervical disc degeneration, cervicothoracic region: Secondary | ICD-10-CM | POA: Diagnosis not present

## 2019-03-31 DIAGNOSIS — M5416 Radiculopathy, lumbar region: Secondary | ICD-10-CM | POA: Diagnosis not present

## 2019-03-31 DIAGNOSIS — M9903 Segmental and somatic dysfunction of lumbar region: Secondary | ICD-10-CM | POA: Diagnosis not present

## 2019-03-31 DIAGNOSIS — M9901 Segmental and somatic dysfunction of cervical region: Secondary | ICD-10-CM | POA: Diagnosis not present

## 2019-04-02 DIAGNOSIS — M5416 Radiculopathy, lumbar region: Secondary | ICD-10-CM | POA: Diagnosis not present

## 2019-04-02 DIAGNOSIS — M5033 Other cervical disc degeneration, cervicothoracic region: Secondary | ICD-10-CM | POA: Diagnosis not present

## 2019-04-02 DIAGNOSIS — M9903 Segmental and somatic dysfunction of lumbar region: Secondary | ICD-10-CM | POA: Diagnosis not present

## 2019-04-02 DIAGNOSIS — M9901 Segmental and somatic dysfunction of cervical region: Secondary | ICD-10-CM | POA: Diagnosis not present

## 2019-04-09 DIAGNOSIS — M9903 Segmental and somatic dysfunction of lumbar region: Secondary | ICD-10-CM | POA: Diagnosis not present

## 2019-04-09 DIAGNOSIS — M5416 Radiculopathy, lumbar region: Secondary | ICD-10-CM | POA: Diagnosis not present

## 2019-04-09 DIAGNOSIS — M9901 Segmental and somatic dysfunction of cervical region: Secondary | ICD-10-CM | POA: Diagnosis not present

## 2019-04-09 DIAGNOSIS — M5033 Other cervical disc degeneration, cervicothoracic region: Secondary | ICD-10-CM | POA: Diagnosis not present

## 2019-04-16 DIAGNOSIS — M5033 Other cervical disc degeneration, cervicothoracic region: Secondary | ICD-10-CM | POA: Diagnosis not present

## 2019-04-16 DIAGNOSIS — M5416 Radiculopathy, lumbar region: Secondary | ICD-10-CM | POA: Diagnosis not present

## 2019-04-16 DIAGNOSIS — M9901 Segmental and somatic dysfunction of cervical region: Secondary | ICD-10-CM | POA: Diagnosis not present

## 2019-04-16 DIAGNOSIS — M9903 Segmental and somatic dysfunction of lumbar region: Secondary | ICD-10-CM | POA: Diagnosis not present

## 2019-04-17 ENCOUNTER — Ambulatory Visit (INDEPENDENT_AMBULATORY_CARE_PROVIDER_SITE_OTHER): Payer: Medicare Other

## 2019-04-17 VITALS — Temp 97.8°F | Ht 63.0 in | Wt 206.0 lb

## 2019-04-17 DIAGNOSIS — Z0000 Encounter for general adult medical examination without abnormal findings: Secondary | ICD-10-CM | POA: Diagnosis not present

## 2019-04-17 NOTE — Patient Instructions (Signed)
Bonnie Rowland , Thank you for taking time to come for your Medicare Wellness Visit. I appreciate your ongoing commitment to your health goals. Please review the following plan we discussed and let me know if I can assist you in the future.   Screening recommendations/referrals: Colonoscopy: done 05/09/16 Mammogram: done 08/14/18 Bone Density: done 08/13/17  Recommended yearly ophthalmology/optometry visit for glaucoma screening and checkup Recommended yearly dental visit for hygiene and checkup  Vaccinations: Influenza vaccine: done 03/19/19 Pneumococcal vaccine: done 07/13/13 Tdap vaccine: done 03/07/12 Shingles vaccine: done 04/05/17    Conditions/risks identified: Keep up the great work!  Next appointment: Please follow up in one year for your Medicare Annual Wellness visit.     Preventive Care 41 Years and Older, Female Preventive care refers to lifestyle choices and visits with your health care provider that can promote health and wellness. What does preventive care include?  A yearly physical exam. This is also called an annual well check.  Dental exams once or twice a year.  Routine eye exams. Ask your health care provider how often you should have your eyes checked.  Personal lifestyle choices, including:  Daily care of your teeth and gums.  Regular physical activity.  Eating a healthy diet.  Avoiding tobacco and drug use.  Limiting alcohol use.  Practicing safe sex.  Taking low-dose aspirin every day.  Taking vitamin and mineral supplements as recommended by your health care provider. What happens during an annual well check? The services and screenings done by your health care provider during your annual well check will depend on your age, overall health, lifestyle risk factors, and family history of disease. Counseling  Your health care provider may ask you questions about your:  Alcohol use.  Tobacco use.  Drug use.  Emotional well-being.  Home and  relationship well-being.  Sexual activity.  Eating habits.  History of falls.  Memory and ability to understand (cognition).  Work and work Statistician.  Reproductive health. Screening  You may have the following tests or measurements:  Height, weight, and BMI.  Blood pressure.  Lipid and cholesterol levels. These may be checked every 5 years, or more frequently if you are over 65 years old.  Skin check.  Lung cancer screening. You may have this screening every year starting at age 31 if you have a 30-pack-year history of smoking and currently smoke or have quit within the past 15 years.  Fecal occult blood test (FOBT) of the stool. You may have this test every year starting at age 42.  Flexible sigmoidoscopy or colonoscopy. You may have a sigmoidoscopy every 5 years or a colonoscopy every 10 years starting at age 83.  Hepatitis C blood test.  Hepatitis B blood test.  Sexually transmitted disease (STD) testing.  Diabetes screening. This is done by checking your blood sugar (glucose) after you have not eaten for a while (fasting). You may have this done every 1-3 years.  Bone density scan. This is done to screen for osteoporosis. You may have this done starting at age 67.  Mammogram. This may be done every 1-2 years. Talk to your health care provider about how often you should have regular mammograms. Talk with your health care provider about your test results, treatment options, and if necessary, the need for more tests. Vaccines  Your health care provider may recommend certain vaccines, such as:  Influenza vaccine. This is recommended every year.  Tetanus, diphtheria, and acellular pertussis (Tdap, Td) vaccine. You may need a Td  booster every 10 years.  Zoster vaccine. You may need this after age 59.  Pneumococcal 13-valent conjugate (PCV13) vaccine. One dose is recommended after age 22.  Pneumococcal polysaccharide (PPSV23) vaccine. One dose is recommended after  age 35. Talk to your health care provider about which screenings and vaccines you need and how often you need them. This information is not intended to replace advice given to you by your health care provider. Make sure you discuss any questions you have with your health care provider. Document Released: 07/15/2015 Document Revised: 03/07/2016 Document Reviewed: 04/19/2015 Elsevier Interactive Patient Education  2017 [place] Prevention in the Home Falls can cause injuries. They can happen to people of all ages. There are many things you can do to make your home safe and to help prevent falls. What can I do on the outside of my home?  Regularly fix the edges of walkways and driveways and fix any cracks.  Remove anything that might make you trip as you walk through a door, such as a raised step or threshold.  Trim any bushes or trees on the path to your home.  Use bright outdoor lighting.  Clear any walking paths of anything that might make someone trip, such as rocks or tools.  Regularly check to see if handrails are loose or broken. Make sure that both sides of any steps have handrails.  Any raised decks and porches should have guardrails on the edges.  Have any leaves, snow, or ice cleared regularly.  Use sand or salt on walking paths during winter.  Clean up any spills in your garage right away. This includes oil or grease spills. What can I do in the bathroom?  Use night lights.  Install grab bars by the toilet and in the tub and shower. Do not use towel bars as grab bars.  Use non-skid mats or decals in the tub or shower.  If you need to sit down in the shower, use a plastic, non-slip stool.  Keep the floor dry. Clean up any water that spills on the floor as soon as it happens.  Remove soap buildup in the tub or shower regularly.  Attach bath mats securely with double-sided non-slip rug tape.  Do not have throw rugs and other things on the floor that can  make you trip. What can I do in the bedroom?  Use night lights.  Make sure that you have a light by your bed that is easy to reach.  Do not use any sheets or blankets that are too big for your bed. They should not hang down onto the floor.  Have a firm chair that has side arms. You can use this for support while you get dressed.  Do not have throw rugs and other things on the floor that can make you trip. What can I do in the kitchen?  Clean up any spills right away.  Avoid walking on wet floors.  Keep items that you use a lot in easy-to-reach places.  If you need to reach something above you, use a strong step stool that has a grab bar.  Keep electrical cords out of the way.  Do not use floor polish or wax that makes floors slippery. If you must use wax, use non-skid floor wax.  Do not have throw rugs and other things on the floor that can make you trip. What can I do with my stairs?  Do not leave any items on the stairs.  Make sure that there are handrails on both sides of the stairs and use them. Fix handrails that are broken or loose. Make sure that handrails are as long as the stairways.  Check any carpeting to make sure that it is firmly attached to the stairs. Fix any carpet that is loose or worn.  Avoid having throw rugs at the top or bottom of the stairs. If you do have throw rugs, attach them to the floor with carpet tape.  Make sure that you have a light switch at the top of the stairs and the bottom of the stairs. If you do not have them, ask someone to add them for you. What else can I do to help prevent falls?  Wear shoes that:  Do not have high heels.  Have rubber bottoms.  Are comfortable and fit you well.  Are closed at the toe. Do not wear sandals.  If you use a stepladder:  Make sure that it is fully opened. Do not climb a closed stepladder.  Make sure that both sides of the stepladder are locked into place.  Ask someone to hold it for you,  if possible.  Clearly mark and make sure that you can see:  Any grab bars or handrails.  First and last steps.  Where the edge of each step is.  Use tools that help you move around (mobility aids) if they are needed. These include:  Canes.  Walkers.  Scooters.  Crutches.  Turn on the lights when you go into a dark area. Replace any light bulbs as soon as they burn out.  Set up your furniture so you have a clear path. Avoid moving your furniture around.  If any of your floors are uneven, fix them.  If there are any pets around you, be aware of where they are.  Review your medicines with your doctor. Some medicines can make you feel dizzy. This can increase your chance of falling. Ask your doctor what other things that you can do to help prevent falls. This information is not intended to replace advice given to you by your health care provider. Make sure you discuss any questions you have with your health care provider. Document Released: 04/14/2009 Document Revised: 11/24/2015 Document Reviewed: 07/23/2014 Elsevier Interactive Patient Education  2017 [employer].

## 2019-04-17 NOTE — Progress Notes (Signed)
Subjective:   Bonnie Rowland is a 78 y.o. female who presents for Medicare Annual (Subsequent) preventive examination.  Virtual Visit via Telephone Note  I connected with Bonnie Rowland on 04/17/19 at  8:00 AM EDT by telephone and verified that I am speaking with the correct person using two identifiers.  Medicare Annual Wellness visit completed telephonically due to Covid-19 pandemic.   Location: Patient: home Provider: office   I discussed the limitations, risks, security and privacy concerns of performing an evaluation and management service by telephone and the availability of in person appointments. The patient expressed understanding and agreed to proceed.  Some vital signs may be absent or patient reported.   Clemetine Marker, LPN    Review of Systems:   Cardiac Risk Factors include: advanced age (>75men, >5 women);diabetes mellitus;dyslipidemia;hypertension;obesity (BMI >30kg/m2)     Objective:     Vitals: Temp 97.8 F (36.6 C)   Ht 5\' 3"  (1.6 m)   Wt 206 lb (93.4 kg)   BMI 36.49 kg/m   Body mass index is 36.49 kg/m.  Advanced Directives 04/17/2019 04/11/2018 04/08/2017 02/18/2017 01/10/2017 01/04/2017 12/26/2016  Does Patient Have a Medical Advance Directive? Yes Yes Yes Yes No No No  Type of Paramedic of [place];Living will [place];Living will [place];Living will [place];Living will - - -  Does patient want to make changes to medical advance directive? - No - Patient declined - - - - -  Copy of [place] in Chart? Yes - validated most recent copy scanned in chart (See row information) Yes Yes Yes - - -  Would patient like information on creating a medical advance directive? - - - - - - -    Tobacco Social History   Tobacco Use  Smoking Status Never Smoker  Smokeless Tobacco Never Used     Counseling given: Not Answered   Clinical Intake:   Pre-visit preparation completed: Yes  Pain : 0-10 Pain Score: 2  Pain Type: Chronic pain Pain Location: Back Pain Orientation: Lower Pain Descriptors / Indicators: Aching, Discomfort Pain Onset: More than a month ago Pain Frequency: Constant     BMI - recorded: 36.49 Nutritional Status: BMI > 30  Obese Nutritional Risks: Nausea/ vomitting/ diarrhea(diarrhea likely related to medication) Diabetes: Yes CBG done?: No Did pt. bring in CBG monitor from home?: No   Nutrition Risk Assessment:  Has the patient had any N/V/D within the last 2 months?  Yes  - diarrhea likely related to bydureon Does the patient have any non-healing wounds?  No  Has the patient had any unintentional weight loss or weight gain?  No   Diabetes:  Is the patient diabetic?  Yes  If diabetic, was a CBG obtained today?  No  Did the patient bring in their glucometer from home?  No  How often do you monitor your CBG's? Averages with freestyle libre.   Financial Strains and Diabetes Management:  Are you having any financial strains with the device, your supplies or your medication? No .  Does the patient want to be seen by Chronic Care Management for management of their diabetes?  No  Would the patient like to be referred to a Nutritionist or for Diabetic Management?  No   Diabetic Exams:  Diabetic Eye Exam: Completed 01/08/19.   Diabetic Foot Exam: Completed 04/11/18. Pt has been advised about the importance in completing this exam. Pt is scheduled to see  Dr. Honor Junes next week.   How often do you need to have someone help you when you read instructions, pamphlets, or other written materials from your doctor or pharmacy?: 1 - Never  Interpreter Needed?: No  Information entered by :: Clemetine Marker LPN  Past Medical History:  Diagnosis Date  . Acute cystitis   . Allergic rhinitis, cause unspecified   . 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 Bronchitis, not specified as acute or chronic   .  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 Cancer (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 Cellulitis and abscess of leg, except foot   . Conjunctivitis unspecified   . Dermatophytosis of nail   . Diabetes mellitus    type II  . Esophageal reflux   . Hyperlipidemia   . Hypertension   . 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 Renal artery stenosis ([place])   . Sprain of lumbar region   . Thoracic or lumbosacral neuritis or radiculitis, unspecified   . Urinary tract infection, site not specified    Past Surgical History:  Procedure Laterality Date  . APPENDECTOMY    . CARDIAC CATHETERIZATION    . cataract surgery    . COLONOSCOPY    . COLONOSCOPY WITH PROPOFOL N/A 05/09/2016   Procedure: COLONOSCOPY WITH PROPOFOL;  Surgeon: Robert Bellow, MD;  Location: [facility] ENDOSCOPY;  Service: Endoscopy;  Laterality: N/A;  . DIAGNOSTIC MAMMOGRAM    . EYE SURGERY Bilateral    Cataract Extraction  . KNEE ARTHROPLASTY Right 03/30/2015   Procedure: COMPUTER ASSISTED TOTAL KNEE ARTHROPLASTY;  Surgeon: Dereck Leep, MD;  Location: [facility] ORS;  Service: Orthopedics;  Laterality: Right;  . KNEE ARTHROSCOPY Right   . KNEE CLOSED REDUCTION Right 05/23/2015   Procedure: CLOSED MANIPULATION KNEE;  Surgeon: Dereck Leep, MD;  Location: [facility] ORS;  Service: Orthopedics;  Laterality: Right;  . TONSILLECTOMY     Family History  Problem Relation Age of Onset  . Diabetes Mother   . Hypertension Mother   . Cancer Mother   . Hypertension Father   . Cancer Father        Prostate  . Breast cancer Maternal Aunt 32  . Colon cancer Son   . Kidney cancer Neg Hx   . Bladder Cancer Neg Hx    Social History   Socioeconomic History  . Marital status: Married    Spouse name: Emergency planning/management officer  . Number of children: 1  . Years of education: Not on file  . Highest education level: Master's degree (e.g., MA, MS, MEng, MEd, MSW, MBA)  Occupational History  . Occupation: Retired  Scientific laboratory technician  .  Financial resource strain: Not hard at all  . Food insecurity    Worry: Never true    Inability: Never true  . Transportation needs    Medical: No    Non-medical: No  Tobacco Use  . Smoking status: Never Smoker  . Smokeless tobacco: Never Used  Substance and Sexual Activity  . Alcohol use: Not Currently    Alcohol/week: 0.0 standard drinks  . Drug use: No  . Sexual activity: Yes    Partners: Male  Lifestyle  . Physical activity    Days per week: 3 days    Minutes per session: 20 min  . Stress: Not at all  Relationships  . Social connections    Talks on phone: More than three times a week  Gets together: More than three times a week    Attends religious service: Never    Active member of club or organization: Yes    Attends meetings of clubs or organizations: More than 4 times per year    Relationship status: Married  Other Topics Concern  . Not on file  Social History Narrative   She is married , only has one son and he was diagnosed with colon cancer at age 55.     Outpatient Encounter Medications as of 04/17/2019  Medication Sig  . acetaminophen (TYLENOL) 500 MG tablet Take 500 mg by mouth every 6 (six) hours as needed.  Marland Kitchen ALFALFA PO Take 3 tablets by mouth every morning.  Marland Kitchen aspirin 81 MG EC tablet Take 81 mg by mouth at bedtime.   . B Complex-C (B-COMPLEX WITH VITAMIN C) tablet Take 2 tablets by mouth at bedtime.   . BD PEN NEEDLE NANO U/F 32G X 4 MM MISC USE 4 TIMES DAILY (HUMALOG 3 TIMES DAILY AND LANTUS ONCE DAILY) OFFICE NOTIFIED 08/06/17  . BLACK ELDERBERRY,BERRY-FLOWER, PO Take by mouth.  Marland Kitchen BYDUREON 2 MG PEN INJECT 2 MG SUBCUTANEOUSLY ONCE A WEEK  . calcium carbonate (OS-CAL) 600 MG TABS tablet Take 600 mg by mouth daily.  . cholecalciferol (VITAMIN D) 1000 UNITS tablet Take 4,000 Units by mouth daily.   . clobetasol ointment (TEMOVATE) [id number] % APPLY 1 APPLICATION TOPICALLY 2 (TWO) TIMES DAILY AS NEEDED (BUG BITES).  . Coenzyme Q10 (COQ10) 200 MG CAPS Take 1  tablet by mouth daily. 400 mg every am.  . Continuous Blood Gluc Sensor ([place]) MISC by Does not apply route.  Regino Schultze Bandages & Supports (MEDICAL COMPRESSION STOCKINGS) MISC 1 each by Does not apply route daily.  Marland Kitchen estradiol (ESTRACE VAGINAL) 0.1 MG/GM vaginal cream 1/2 gm once weekly using applicator, apply blueberry sized amount of cream using tip of finger to urethra twice weekly  . Eyelid Cleansers (AVENOVA) 0.01 % SOLN See admin instructions.  . fish oil-omega-3 fatty acids 1000 MG capsule Take 2 g by mouth daily.    . fluticasone (FLONASE) 50 MCG/ACT nasal spray PLACE 2 SPRAYS INTO BOTH NOSTRILS DAILY.  Marland Kitchen GARLIC [id number] PO Take 1 tablet by mouth daily. 1 gram  . Glucosamine 500 MG TABS Take 1 tablet by mouth daily.   Marland Kitchen HUMALOG KWIKPEN 100 UNIT/ML KiwkPen Inject 6 Units into the skin 3 (three) times daily after meals. Pt taking before meals per Dr. Honor Junes  . Insulin Glargine (LANTUS SOLOSTAR) 100 UNIT/ML Solostar Pen Inject 24 Units into the skin every morning. (Patient taking differently: Inject 22 Units into the skin every morning. )  . losartan-hydrochlorothiazide (HYZAAR) 100-25 MG tablet TAKE 1 TABLET BY MOUTH EVERY DAY  . Mag Aspart-Potassium Aspart (POTASSIUM & MAGNESIUM ASPARTAT PO) Take 1 tablet by mouth daily.  . metoprolol succinate (TOPROL-XL) 25 MG 24 hr tablet TAKE 1 TABLET BY MOUTH EVERY DAY  . Multiple Vitamins-Minerals (PRESERVISION AREDS 2) CAPS Take 1 capsule by mouth 2 (two) times daily.  Marland Kitchen nystatin cream (MYCOSTATIN) Apply 1 application topically 2 (two) times daily.  . Probiotic Product (PROBIOTIC PO) Take 1 Dose by mouth at bedtime.  Marland Kitchen Propylene Glycol (SYSTANE BALANCE OP) Apply 1 drop to eye as needed.  . rosuvastatin (CRESTOR) 10 MG tablet Take 1 tablet (10 mg total) by mouth daily.  . sodium fluoride (DENTAGEL) 1.1 % GEL dental gel   . sulfacetamide (BLEPH-10) 10 % ophthalmic solution Place 1 drop into  both eyes 3 (three) times daily as  needed.  . Vitamin D, Ergocalciferol, (DRISDOL) 1.25 MG (50000 UT) CAPS capsule Take 1 capsule by mouth once a week.  . vitamin E (VITAMIN E) 400 UNIT capsule Take 400 Units by mouth daily. Every am  . Zoledronic Acid (RECLAST IV) Inject into the vein. Every year  . [DISCONTINUED] Exenatide ER 2 MG SRER Inject 2 mg into the skin once a week.  . [DISCONTINUED] fluconazole (DIFLUCAN) 150 MG tablet Take 1 tablet (150 mg total) by mouth every other day.   No facility-administered encounter medications on file as of 04/17/2019.     Activities of Daily Living In your present state of health, do you have any difficulty performing the following activities: 04/17/2019  Hearing? Y  Comment has hearing aids  Vision? N  Difficulty concentrating or making decisions? N  Walking or climbing stairs? N  Dressing or bathing? N  Doing errands, shopping? N  Preparing Food and eating ? N  Using the Toilet? N  In the past six months, have you accidently leaked urine? Y  Comment wears pads for protection  Do you have problems with loss of bowel control? N  Managing your Medications? N  Managing your Finances? N  Housekeeping or managing your Housekeeping? N  Some recent data might be hidden    Patient Care Team: Steele Sizer, MD as PCP - General (Family Medicine) Rockey Situ, Kathlene November, MD as Consulting Physician (Cardiology) Byrnett, Forest Gleason, MD (General Surgery) Dasher, Rayvon Char, MD as Consulting Physician (Dermatology) Dingeldein, Remo Lipps, MD as Consulting Physician (Ophthalmology) Starling Manns, Teresa Pelton, MD as Referring Physician (Ophthalmology) Lonia Farber, MD as Consulting Physician (Internal Medicine) Beverly Gust, MD as Consulting Physician (Otolaryngology) Flint Melter, MD (Ophthalmology)    Assessment:   This is a routine wellness examination for [place].  Exercise Activities and Dietary recommendations Current Exercise Habits: Home exercise routine, Type of exercise:  walking, Time (Minutes): 20, Frequency (Times/Week): 3, Weekly Exercise (Minutes/Week): 60, Intensity: Mild, Exercise limited by: orthopedic condition(s)  Goals    . Reduce sugar intake to X grams per day     Recommend to eliminate carbs and sweets from diet    . Weight (lb) < 200 lb (90.7 kg)     Goal is to loose 10 pounds by next year.  Wt Readings from Last 3 Encounters:  04/11/18 204 lb 1.6 oz (92.6 kg)  01/06/18 205 lb 1.6 oz (93 kg)  12/16/17 206 lb 6.4 oz (93.6 kg)          Fall Risk Fall Risk  04/17/2019 01/08/2019 07/10/2018 04/11/2018 01/06/2018  Falls in the past year? 0 0 0 No No  Comment - - - - -  Number falls in past yr: 0 0 0 - -  Injury with Fall? 0 0 0 - -  Risk for fall due to : - - - - -  Risk for fall due to: Comment - - - - -  Follow up Falls prevention discussed - - - -   FALL RISK PREVENTION PERTAINING TO THE HOME:  Any stairs in or around the home? Yes  If so, do they handrails? Yes   Home free of loose throw rugs in walkways, pet beds, electrical cords, etc? Yes  Adequate lighting in your home to reduce risk of falls? Yes   ASSISTIVE DEVICES UTILIZED TO PREVENT FALLS:  Life alert? No  Use of a cane, walker or w/c? Yes  Grab bars in the bathroom?  Yes  Shower chair or bench in shower? Yes  Elevated toilet seat or a handicapped toilet? Yes   DME ORDERS:  DME order needed?  No   TIMED UP AND GO:  Was the test performed? No . Telephonic visit.    Education: Fall risk prevention has been discussed.  Intervention(s) required? No   Depression Screen PHQ 2/9 Scores 04/17/2019 01/08/2019 07/10/2018 04/11/2018  PHQ - 2 Score 0 0 0 0  PHQ- 9 Score - 0 - -  Exception Documentation - - - -     Cognitive Function pt declined 6CIT for 2020 AWV     6CIT Screen 04/11/2018 04/08/2017  What Year? 0 points 0 points  What month? 0 points 0 points  What time? 0 points 0 points  Count back from 20 0 points 0 points  Months in reverse 0 points 0 points   Repeat phrase 0 points 0 points  Total Score 0 0    Immunization History  Administered Date(s) Administered  . Fluad Quad(high Dose 65+) 03/19/2019  . Influenza, High Dose Seasonal PF 03/10/2015, 03/02/2016, 03/12/2017, 04/07/2018  . Pneumococcal Conjugate-13 07/13/2013  . Pneumococcal Polysaccharide-23 03/31/2010  . Tdap 03/07/2012  . Zoster 06/01/2010  . Zoster Recombinat (Shingrix) 02/02/2017, 04/05/2017    Qualifies for Shingles Vaccine? Shingrix series completed.   Tdap: Up to date  Flu Vaccine: Up to date  Pneumococcal Vaccine: Up to date   Screening Tests Health Maintenance  Topic Date Due  . FOOT EXAM  04/12/2019  . HEMOGLOBIN A1C  05/23/2019  . DEXA SCAN  08/14/2019  . MAMMOGRAM  08/15/2019  . OPHTHALMOLOGY EXAM  01/08/2020  . TETANUS/TDAP  03/07/2022  . INFLUENZA VACCINE  Completed  . PNA vac Low Risk Adult  Completed    Cancer Screenings:  Colorectal Screening: Completed 05/09/16. No longer required.   Mammogram: Completed 08/14/18. Repeat every year.  Bone Density: Completed 08/13/17. Results reflect OSTEOPOROSIS. Repeat every 2 years.   Lung Cancer Screening: (Low Dose CT Chest recommended if Age 14-80 years, 30 pack-year currently smoking OR have quit w/in 15years.) does not qualify.   Additional Screening:  Hepatitis C Screening: no longer required  Vision Screening: Recommended annual ophthalmology exams for early detection of glaucoma and other disorders of the eye. Is the patient up to date with their annual eye exam?  Yes  Who is the provider or what is the name of the office in which the pt attends annual eye exams? [place] Screening: Recommended annual dental exams for proper oral hygiene  Community Resource Referral:  CRR required this visit?  No      Plan:     I have personally reviewed and addressed the Medicare Annual Wellness questionnaire and have noted the following in the patient's chart:  A. Medical and  social history B. Use of alcohol, tobacco or illicit drugs  C. Current medications and supplements D. Functional ability and status E.  Nutritional status F.  Physical activity G. Advance directives H. List of other physicians I.  Hospitalizations, surgeries, and ER visits in previous 12 months J.  [place] such as hearing and vision if needed, cognitive and depression L. Referrals and appointments   In addition, I have reviewed and discussed with patient certain preventive protocols, quality metrics, and best practice recommendations. A written personalized care plan for preventive services as well as general preventive health recommendations were provided to patient.   Signed,  Clemetine Marker, LPN Nurse Health Advisor  Nurse Notes:

## 2019-04-23 DIAGNOSIS — Z794 Long term (current) use of insulin: Secondary | ICD-10-CM | POA: Diagnosis not present

## 2019-04-23 DIAGNOSIS — I10 Essential (primary) hypertension: Secondary | ICD-10-CM | POA: Diagnosis not present

## 2019-04-23 DIAGNOSIS — E559 Vitamin D deficiency, unspecified: Secondary | ICD-10-CM | POA: Diagnosis not present

## 2019-04-23 DIAGNOSIS — E1159 Type 2 diabetes mellitus with other circulatory complications: Secondary | ICD-10-CM | POA: Diagnosis not present

## 2019-04-23 DIAGNOSIS — M810 Age-related osteoporosis without current pathological fracture: Secondary | ICD-10-CM | POA: Diagnosis not present

## 2019-04-23 DIAGNOSIS — E1169 Type 2 diabetes mellitus with other specified complication: Secondary | ICD-10-CM | POA: Diagnosis not present

## 2019-04-23 DIAGNOSIS — E1165 Type 2 diabetes mellitus with hyperglycemia: Secondary | ICD-10-CM | POA: Diagnosis not present

## 2019-04-23 DIAGNOSIS — E785 Hyperlipidemia, unspecified: Secondary | ICD-10-CM | POA: Diagnosis not present

## 2019-04-24 DIAGNOSIS — M9903 Segmental and somatic dysfunction of lumbar region: Secondary | ICD-10-CM | POA: Diagnosis not present

## 2019-04-24 DIAGNOSIS — M5033 Other cervical disc degeneration, cervicothoracic region: Secondary | ICD-10-CM | POA: Diagnosis not present

## 2019-04-24 DIAGNOSIS — M5416 Radiculopathy, lumbar region: Secondary | ICD-10-CM | POA: Diagnosis not present

## 2019-04-24 DIAGNOSIS — M9901 Segmental and somatic dysfunction of cervical region: Secondary | ICD-10-CM | POA: Diagnosis not present

## 2019-04-30 DIAGNOSIS — M9901 Segmental and somatic dysfunction of cervical region: Secondary | ICD-10-CM | POA: Diagnosis not present

## 2019-04-30 DIAGNOSIS — M5416 Radiculopathy, lumbar region: Secondary | ICD-10-CM | POA: Diagnosis not present

## 2019-04-30 DIAGNOSIS — M5033 Other cervical disc degeneration, cervicothoracic region: Secondary | ICD-10-CM | POA: Diagnosis not present

## 2019-04-30 DIAGNOSIS — M9903 Segmental and somatic dysfunction of lumbar region: Secondary | ICD-10-CM | POA: Diagnosis not present

## 2019-05-07 DIAGNOSIS — M5416 Radiculopathy, lumbar region: Secondary | ICD-10-CM | POA: Diagnosis not present

## 2019-05-07 DIAGNOSIS — M9903 Segmental and somatic dysfunction of lumbar region: Secondary | ICD-10-CM | POA: Diagnosis not present

## 2019-05-07 DIAGNOSIS — M9901 Segmental and somatic dysfunction of cervical region: Secondary | ICD-10-CM | POA: Diagnosis not present

## 2019-05-07 DIAGNOSIS — M5033 Other cervical disc degeneration, cervicothoracic region: Secondary | ICD-10-CM | POA: Diagnosis not present

## 2019-05-20 ENCOUNTER — Ambulatory Visit: Payer: Medicare Other | Admitting: Orthotics

## 2019-05-20 ENCOUNTER — Other Ambulatory Visit: Payer: Self-pay

## 2019-05-20 DIAGNOSIS — E113299 Type 2 diabetes mellitus with mild nonproliferative diabetic retinopathy without macular edema, unspecified eye: Secondary | ICD-10-CM

## 2019-05-20 DIAGNOSIS — IMO0002 Reserved for concepts with insufficient information to code with codable children: Secondary | ICD-10-CM

## 2019-05-20 NOTE — Progress Notes (Signed)

## 2019-05-21 ENCOUNTER — Encounter: Payer: Self-pay | Admitting: Podiatry

## 2019-05-21 ENCOUNTER — Ambulatory Visit (INDEPENDENT_AMBULATORY_CARE_PROVIDER_SITE_OTHER): Payer: Medicare Other | Admitting: Podiatry

## 2019-05-21 DIAGNOSIS — M5416 Radiculopathy, lumbar region: Secondary | ICD-10-CM | POA: Diagnosis not present

## 2019-05-21 DIAGNOSIS — M722 Plantar fascial fibromatosis: Secondary | ICD-10-CM | POA: Diagnosis not present

## 2019-05-21 DIAGNOSIS — M9901 Segmental and somatic dysfunction of cervical region: Secondary | ICD-10-CM | POA: Diagnosis not present

## 2019-05-21 DIAGNOSIS — M9903 Segmental and somatic dysfunction of lumbar region: Secondary | ICD-10-CM | POA: Diagnosis not present

## 2019-05-21 DIAGNOSIS — I701 Atherosclerosis of renal artery: Secondary | ICD-10-CM | POA: Diagnosis not present

## 2019-05-21 DIAGNOSIS — M5033 Other cervical disc degeneration, cervicothoracic region: Secondary | ICD-10-CM | POA: Diagnosis not present

## 2019-05-21 NOTE — Progress Notes (Addendum)
This patient presents the office for a diabetic foot exam and desires to also receive a new pair of foot orthoses.   She says that 4 years ago she was evaluated by Dr. Paulla Dolly who diagnosed her as having plantar fasciitis.  He successfully treated her with orthoses at that time.  She now returns to the office requesting a new pair of orthoses.  She also presents for a diabetic foot exam.  Vascular  Dorsalis pedis and posterior tibial pulses are palpable  B/L.  Capillary return  WNL.  Temperature gradient is  WNL.  Skin turgor  WNL  Sensorium  Senn Weinstein monofilament wire  Diminished . Normal tactile sensation.  Nail Exam  Patient has normal nails with no evidence of bacterial or fungal infection.  Orthopedic  Exam  Muscle tone and muscle strength  WNL.  No limitations of motion feet  B/L.  No crepitus or joint effusion noted. 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  Bony prominences are unremarkable.  Skin  No open lesions.  Normal skin texture and turgor. Clavi 3rd toe left foot.  S/P plantar fasciitis  Diabetes with neuropathy.  ROV.  Diabetic foot exam was performed and she has no evidence of any vascular  pathology.  Patient to be followed up by Liliane Channel to obtain diabetic shoes.  Padding was dispensed for her to wear to separate her first and second digits bilateral.  Return to clinic for her annual diabetic  evaluation in 1 year.  [name] DPM

## 2019-05-24 ENCOUNTER — Other Ambulatory Visit: Payer: Self-pay | Admitting: Family Medicine

## 2019-05-24 DIAGNOSIS — I10 Essential (primary) hypertension: Secondary | ICD-10-CM

## 2019-05-27 DIAGNOSIS — M5416 Radiculopathy, lumbar region: Secondary | ICD-10-CM | POA: Diagnosis not present

## 2019-05-27 DIAGNOSIS — M9903 Segmental and somatic dysfunction of lumbar region: Secondary | ICD-10-CM | POA: Diagnosis not present

## 2019-05-27 DIAGNOSIS — M9901 Segmental and somatic dysfunction of cervical region: Secondary | ICD-10-CM | POA: Diagnosis not present

## 2019-05-27 DIAGNOSIS — M5033 Other cervical disc degeneration, cervicothoracic region: Secondary | ICD-10-CM | POA: Diagnosis not present

## 2019-06-04 DIAGNOSIS — M9901 Segmental and somatic dysfunction of cervical region: Secondary | ICD-10-CM | POA: Diagnosis not present

## 2019-06-04 DIAGNOSIS — M5416 Radiculopathy, lumbar region: Secondary | ICD-10-CM | POA: Diagnosis not present

## 2019-06-04 DIAGNOSIS — M9903 Segmental and somatic dysfunction of lumbar region: Secondary | ICD-10-CM | POA: Diagnosis not present

## 2019-06-04 DIAGNOSIS — M5033 Other cervical disc degeneration, cervicothoracic region: Secondary | ICD-10-CM | POA: Diagnosis not present

## 2019-06-09 ENCOUNTER — Other Ambulatory Visit: Payer: Self-pay | Admitting: Urology

## 2019-06-09 ENCOUNTER — Other Ambulatory Visit: Payer: Self-pay | Admitting: Family Medicine

## 2019-06-09 DIAGNOSIS — B372 Candidiasis of skin and nail: Secondary | ICD-10-CM

## 2019-06-10 NOTE — Progress Notes (Signed)
06/11/2019 11:12 AM   Bonnie Rowland 04/24/1941 [id number]  Referring provider: Steele Sizer, MD [address] [place],  [place] [address]  Chief Complaint  Patient presents with  . Urinary Incontinence    HPI: 78 yo DM female with vaginal atrophy, urge incontinence and a left renal stone who presents today for follow up.  Vaginal atrophy She states she is using the vaginal estrogen consistently and the vaginal discomfort has improved.    Urge incontinence The patient is  experiencing urgency x 4-7, frequency x 4-7, not restricting fluids to avoid visits to the restroom, is engaging in toilet mapping, incontinence x 4-7 and nocturia x 0-3.   Her BP is 162/62.   Her PVR is 0 mL.    Cysto in 07/2017 with Dr. Erlene Quan was NED.  She is having urgency and nocturia.  She states she is drinking a lot of water and the getting up at night is not bothersome as she falls right back to sleep.   She is using tons of POISE pads for urge incontinence.  Patient denies any gross hematuria, dysuria or suprapubic/flank pain.  Patient denies any fevers, chills, nausea or vomiting.   Renal stone CT Renal stone study performed 06/28/2017 noted nonobstructing left nephrolithiasis. No hydronephrosis. No ureteral or bladder stones.  No acute abnormality. No evidence of bowel obstruction or acute bowel inflammation.  Chronic findings include: Cholelithiasis. Aortic Atherosclerosis.  Small hiatal hernia.  She has been prescribed Diflucan by her PCP, Dr. Ancil Boozer.  Requesting more of the Nystatin cream.    PMH: Past Medical History:  Diagnosis Date  . Acute cystitis   . Allergic rhinitis, cause unspecified   . 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 Bronchitis, not specified as acute or chronic   . 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 Cellulitis and abscess of leg, except foot   . Conjunctivitis  unspecified   . Dermatophytosis of nail   . Diabetes mellitus    type II  . Esophageal reflux   . Hyperlipidemia   . Hypertension   . 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 Renal artery stenosis ([facility])   . Sprain of lumbar region   . Thoracic or lumbosacral neuritis or radiculitis, unspecified   . Urinary tract infection, site not specified     Surgical History: Past Surgical History:  Procedure Laterality Date  . APPENDECTOMY    . CARDIAC CATHETERIZATION    . cataract surgery    . COLONOSCOPY    . COLONOSCOPY WITH PROPOFOL N/A 05/09/2016   Procedure: COLONOSCOPY WITH PROPOFOL;  Surgeon: Robert Bellow, MD;  Location: [facility] ENDOSCOPY;  Service: Endoscopy;  Laterality: N/A;  . DIAGNOSTIC MAMMOGRAM    . EYE SURGERY Bilateral    Cataract Extraction  . KNEE ARTHROPLASTY Right 03/30/2015   Procedure: COMPUTER ASSISTED TOTAL KNEE ARTHROPLASTY;  Surgeon: Dereck Leep, MD;  Location: [facility] ORS;  Service: Orthopedics;  Laterality: Right;  . KNEE ARTHROSCOPY Right   . KNEE CLOSED REDUCTION Right 05/23/2015   Procedure: CLOSED MANIPULATION KNEE;  Surgeon: Dereck Leep, MD;  Location: [facility] ORS;  Service: Orthopedics;  Laterality: Right;  . TONSILLECTOMY      Home Medications:  Allergies as of 06/11/2019      Reactions   Cyclobenzaprine Hypertension   Cheese Other (See Comments)   bloating   Ciprofloxacin Hcl Other (  See Comments)   Muscle pain   Coconut Oil    Keflex [cephalexin] Other (See Comments)   Patient prefers not to take this medication due to the side effects      Medication List       Accurate as of June 11, 2019 11:12 AM. If you have any questions, ask your nurse or doctor.        acetaminophen 500 MG tablet Commonly known as: TYLENOL Take 500 mg by mouth every 6 (six) hours as needed.   ALFALFA PO Take 3 tablets by mouth every morning.   aspirin 81 MG EC tablet Take 81 mg by mouth at bedtime.   Avenova 0.01 % Soln See admin instructions.   B-complex  with vitamin C tablet Take 2 tablets by mouth at bedtime.   BD Pen Needle Nano U/F 32G X 4 MM Misc Generic drug: Insulin Pen Needle USE 4 TIMES DAILY (HUMALOG 3 TIMES DAILY AND LANTUS ONCE DAILY) OFFICE NOTIFIED 08/06/17   BLACK ELDERBERRY(BERRY-FLOWER) PO Take by mouth.   Bydureon 2 MG Pen Generic drug: Exenatide ER INJECT 2 MG SUBCUTANEOUSLY ONCE A WEEK   calcium carbonate 600 MG Tabs tablet Commonly known as: OS-CAL Take 600 mg by mouth daily.   cholecalciferol 1000 units tablet Commonly known as: VITAMIN D Take 4,000 Units by mouth daily.   clobetasol ointment 0.05 % Commonly known as: TEMOVATE APPLY 1 APPLICATION TOPICALLY 2 (TWO) TIMES DAILY AS NEEDED (BUG BITES).   CoQ10 200 MG Caps Take 1 tablet by mouth daily. 400 mg every am.   DentaGel 1.1 % Gel dental gel Generic drug: sodium fluoride   estradiol 0.1 MG/GM vaginal cream Commonly known as: ESTRACE VAGINAL 1/2 gm once weekly using applicator, apply blueberry sized amount of cream using tip of finger to urethra twice weekly   fish oil-omega-3 fatty acids 1000 MG capsule Take 2 g by mouth daily.   fluconazole 150 MG tablet Commonly known as: DIFLUCAN TAKE 1 TABLET (150 MG TOTAL) BY MOUTH EVERY OTHER DAY.   fluticasone 50 MCG/ACT nasal spray Commonly known as: FLONASE PLACE 2 SPRAYS INTO BOTH NOSTRILS DAILY.   FreeStyle [employer] Misc by Does not apply route.   GARLIC 99991111 PO Take 1 tablet by mouth daily. 1 gram   Glucosamine 500 MG Tabs Take 1 tablet by mouth daily.   HumaLOG KwikPen 100 UNIT/ML KwikPen Generic drug: insulin lispro Inject 6 Units into the skin 3 (three) times daily after meals. Pt taking before meals per Dr. Honor Junes   Insulin Glargine 100 UNIT/ML Solostar Pen Commonly known as: Lantus SoloStar Inject 24 Units into the skin every morning. What changed: how much to take   losartan-hydrochlorothiazide 100-25 MG tablet Commonly known as: HYZAAR TAKE 1 TABLET BY MOUTH  EVERY DAY   Medical Compression Stockings Misc 1 each by Does not apply route daily.   metoprolol succinate 25 MG 24 hr tablet Commonly known as: TOPROL-XL TAKE 1 TABLET BY MOUTH EVERY DAY   nystatin cream Commonly known as: MYCOSTATIN Apply 1 application topically 2 (two) times daily.   POTASSIUM & MAGNESIUM ASPARTAT PO Take 1 tablet by mouth daily.   PreserVision AREDS 2 Caps Take 1 capsule by mouth 2 (two) times daily.   PROBIOTIC PO Take 1 Dose by mouth at bedtime.   RECLAST IV Inject into the vein. Every year   rosuvastatin 10 MG tablet Commonly known as: CRESTOR Take 1 tablet (10 mg total) by mouth daily.   sulfacetamide 10 %  ophthalmic solution Commonly known as: BLEPH-10 Place 1 drop into both eyes 3 (three) times daily as needed.   SYSTANE BALANCE OP Apply 1 drop to eye as needed.   Vitamin D (Ergocalciferol) 1.25 MG (50000 UT) Caps capsule Commonly known as: DRISDOL Take 1 capsule by mouth once a week.   vitamin E 400 UNIT capsule Generic drug: vitamin E Take 400 Units by mouth daily. Every am       Allergies:  Allergies  Allergen Reactions  . Cyclobenzaprine Hypertension  . Cheese Other (See Comments)    bloating  . Ciprofloxacin Hcl Other (See Comments)    Muscle pain  . Coconut Oil   . Keflex [Cephalexin] Other (See Comments)    Patient prefers not to take this medication due to the side effects    Family History: Family History  Problem Relation Age of Onset  . Diabetes Mother   . Hypertension Mother   . Cancer Mother   . Hypertension Father   . Cancer Father        Prostate  . Breast cancer Maternal Aunt 29  . Colon cancer Son   . Kidney cancer Neg Hx   . Bladder Cancer Neg Hx     Social History:  reports that she has never smoked. She has never used smokeless tobacco. She reports previous alcohol use. She reports that she does not use drugs.  ROS: UROLOGY Frequent Urination?: No Hard to postpone urination?:  Yes Burning/pain with urination?: No Get up at night to urinate?: Yes Leakage of urine?: No Urine stream starts and stops?: No Trouble starting stream?: No Do you have to strain to urinate?: No Blood in urine?: No Urinary tract infection?: No Sexually transmitted disease?: No Injury to kidneys or bladder?: No Painful intercourse?: No Weak stream?: No Currently pregnant?: No Vaginal bleeding?: No Last menstrual period?: n  Gastrointestinal Nausea?: No Vomiting?: No Indigestion/heartburn?: No Diarrhea?: Yes Constipation?: No  Constitutional Fever: No Night sweats?: No Weight loss?: No Fatigue?: No  Skin Skin rash/lesions?: No Itching?: No  Eyes Blurred vision?: No Double vision?: No  Ears/Nose/Throat Sore throat?: No Sinus problems?: No  Hematologic/Lymphatic Swollen glands?: No Easy bruising?: No  Cardiovascular Leg swelling?: Yes Chest pain?: No  Respiratory Cough?: No Shortness of breath?: No  Endocrine Excessive thirst?: No  Musculoskeletal Back pain?: Yes Joint pain?: No  Neurological Headaches?: No Dizziness?: No  Psychologic Depression?: No Anxiety?: No  Physical Exam: BP (!) 162/62   Pulse (!) 57   Ht 5\' 3"  (1.6 m)   Wt 206 lb (93.4 kg)   BMI 36.49 kg/m   Constitutional:  Well nourished. Alert and oriented, No acute distress. HEENT: [place] AT, moist mucus membranes.  Trachea midline, no masses. Cardiovascular: No clubbing, cyanosis, or edema. Respiratory: Normal respiratory effort, no increased work of breathing. GI: Abdomen is soft, non tender, non distended, no abdominal masses. Liver and spleen not palpable.  No hernias appreciated.  Stool sample for occult testing is not indicated.   GU: No CVA tenderness.  No bladder fullness or masses.  Erythematous, scaling plaques on inguinal folds and with areas of clearing with plaques.   Skin: No rashes, bruises or suspicious lesions. Lymph: No inguinal adenopathy. Neurologic: Grossly  intact, no focal deficits, moving all 4 extremities. Psychiatric: Normal mood and affect.    Pertinent Imaging Results for [facility], Aniesha P "[name]" (MRN [id number]) as of 06/11/2019 10:13  Ref. Range 06/11/2019 09:48  Scan Result Unknown 0    Assessment and plan:  1.  Urge incontinence Patient is over the age of 70 and therefore not a candidate for anticholinergics due to issues of memory loss and she is not an ideal candidate for med Myrbetriq as she has hypertension We did discuss PTNS therapy and she is interested but she would like to do more research and speak with a friend who had the treatments explained the PTNS provides treatment by indirectly providing electrical stimulation to the nerves responsible for bladder and pelvic floor function - a needle electrode generates an adjustable electrical pulse that travels to the sacral plexus via the tibial nerve which is located in the ankle, among other functions, the sacral nerve plexus regulates bladder and pelvic floor function - treatment protocol requires once-a-week treatments for 12 weeks, 30 minutes per session and many patients begin to see improvements by the 6th treatment. Patients who respond to treatment may require occasional treatments (~ once every 3 weeks) to sustain improvements. PTNS is a low-risk procedure. The most common side-effects with PTNS treatment are temporary and minor, resulting from the placement of the needle electrode. They include minor bleeding, mild pain and skin inflammation and patients have seen up to an 80% success rate with this form of treatment  2 Tinea cruris  Prescription sent for nystatin cream to be applied twice daily  3. Left renal stone No intervention warranted at this time  4. Vaginal Atrophy Continue applying vaginal estrogen cream 3 nights weekly-refill sent to pharmacy   Return in about 1 year (around 06/10/2020), or if symptoms worsen or fail to improve, for OAB questionnaire, PVR  and exam.  These notes generated with voice recognition software. I apologize for typographical errors.  Zara Council, [name]  [facility] [address] [place], [place] [address] [phone]

## 2019-06-11 ENCOUNTER — Ambulatory Visit (INDEPENDENT_AMBULATORY_CARE_PROVIDER_SITE_OTHER): Payer: Medicare Other | Admitting: Urology

## 2019-06-11 ENCOUNTER — Other Ambulatory Visit: Payer: Self-pay

## 2019-06-11 ENCOUNTER — Encounter: Payer: Self-pay | Admitting: Urology

## 2019-06-11 VITALS — BP 162/62 | HR 57 | Ht 63.0 in | Wt 206.0 lb

## 2019-06-11 DIAGNOSIS — B372 Candidiasis of skin and nail: Secondary | ICD-10-CM

## 2019-06-11 DIAGNOSIS — N200 Calculus of kidney: Secondary | ICD-10-CM

## 2019-06-11 DIAGNOSIS — M9901 Segmental and somatic dysfunction of cervical region: Secondary | ICD-10-CM | POA: Diagnosis not present

## 2019-06-11 DIAGNOSIS — N3941 Urge incontinence: Secondary | ICD-10-CM

## 2019-06-11 DIAGNOSIS — M5416 Radiculopathy, lumbar region: Secondary | ICD-10-CM | POA: Diagnosis not present

## 2019-06-11 DIAGNOSIS — I701 Atherosclerosis of renal artery: Secondary | ICD-10-CM

## 2019-06-11 DIAGNOSIS — N952 Postmenopausal atrophic vaginitis: Secondary | ICD-10-CM | POA: Diagnosis not present

## 2019-06-11 DIAGNOSIS — M9903 Segmental and somatic dysfunction of lumbar region: Secondary | ICD-10-CM | POA: Diagnosis not present

## 2019-06-11 DIAGNOSIS — M5033 Other cervical disc degeneration, cervicothoracic region: Secondary | ICD-10-CM | POA: Diagnosis not present

## 2019-06-11 LAB — BLADDER SCAN AMB NON-IMAGING: Scan Result: 0

## 2019-06-11 MED ORDER — NYSTATIN 100000 UNIT/GM EX CREA: 1.0000 "application " | TOPICAL_CREAM | Freq: Two times a day (BID) | CUTANEOUS | 2 refills | Status: DC

## 2019-06-11 MED ORDER — ESTRADIOL 0.1 MG/GM VA CREA: TOPICAL_CREAM | VAGINAL | 4 refills | Status: DC

## 2019-07-01 ENCOUNTER — Other Ambulatory Visit: Payer: Self-pay | Admitting: Family Medicine

## 2019-07-01 DIAGNOSIS — Z1231 Encounter for screening mammogram for malignant neoplasm of breast: Secondary | ICD-10-CM

## 2019-07-09 DIAGNOSIS — M5033 Other cervical disc degeneration, cervicothoracic region: Secondary | ICD-10-CM | POA: Diagnosis not present

## 2019-07-09 DIAGNOSIS — M9903 Segmental and somatic dysfunction of lumbar region: Secondary | ICD-10-CM | POA: Diagnosis not present

## 2019-07-09 DIAGNOSIS — M5416 Radiculopathy, lumbar region: Secondary | ICD-10-CM | POA: Diagnosis not present

## 2019-07-09 DIAGNOSIS — M9901 Segmental and somatic dysfunction of cervical region: Secondary | ICD-10-CM | POA: Diagnosis not present

## 2019-07-10 ENCOUNTER — Ambulatory Visit (INDEPENDENT_AMBULATORY_CARE_PROVIDER_SITE_OTHER): Payer: Medicare Other | Admitting: Family Medicine

## 2019-07-10 ENCOUNTER — Encounter: Payer: Self-pay | Admitting: Family Medicine

## 2019-07-10 ENCOUNTER — Other Ambulatory Visit: Payer: Self-pay

## 2019-07-10 VITALS — Temp 97.8°F | Wt 204.0 lb

## 2019-07-10 DIAGNOSIS — B372 Candidiasis of skin and nail: Secondary | ICD-10-CM

## 2019-07-10 DIAGNOSIS — E1169 Type 2 diabetes mellitus with other specified complication: Secondary | ICD-10-CM

## 2019-07-10 DIAGNOSIS — I700 Atherosclerosis of aorta: Secondary | ICD-10-CM

## 2019-07-10 DIAGNOSIS — E785 Hyperlipidemia, unspecified: Secondary | ICD-10-CM | POA: Diagnosis not present

## 2019-07-10 DIAGNOSIS — I10 Essential (primary) hypertension: Secondary | ICD-10-CM

## 2019-07-10 DIAGNOSIS — M810 Age-related osteoporosis without current pathological fracture: Secondary | ICD-10-CM

## 2019-07-10 DIAGNOSIS — D692 Other nonthrombocytopenic purpura: Secondary | ICD-10-CM | POA: Diagnosis not present

## 2019-07-10 MED ORDER — FLUCONAZOLE 150 MG PO TABS: 150.0000 mg | ORAL_TABLET | ORAL | 0 refills | Status: DC

## 2019-07-10 NOTE — Progress Notes (Signed)
Name: Bonnie Rowland   MRN: [id number]    DOB: 12/16/1940   Date:07/10/2019       Progress Note  Subjective  Chief Complaint  Chief Complaint  Patient presents with  . Diabetes  . Hypertension  . Dyslipidemia    I connected with  Bonnie Rowland  on 07/10/19 at  8:00 AM EST by a video enabled telemedicine application and verified that I am speaking with the correct person using two identifiers.  I discussed the limitations of evaluation and management by telemedicine and the availability of in person appointments. The patient expressed understanding and agreed to proceed. Staff also discussed with the patient that there may be a patient responsible charge related to this service. Patient Location: at home Provider Location: [facility]   HPI  Osteoporosis: shewas non Evista for years but bone density got worse and has been getting Reclast fromDr. Manfred Shirts and is now on Reclast sinceJune 7th, 2019, she is going to have repeat bone density and follow up with Dr. Honor Junes in Feb   DM: seeing Endocrinologist- Dr. Tawanna Solo to date with eye exam, hgbA1C was done 04/2019 and it was 6.9% She is on Bydureon, pre-meal insulin and basal insulin, she  She states not as consistent with diet over the holidays. No polyphagia, polydipsia or polyuria. Eye exam is up to date   HTN: she has been taking medications as prescribed but cannot find her bp monitor and will get a new one. However  She sees Dr. Rockey Situ . Nochest pain, palpitation, no decrease in exercise tolerance. She denies dizziness.  Atherosclerosis of aorta: on statin therapy and also aspirin.She needs to have repeat labs this year. She has been walking daily   Senile purpura:both arms and hands. Unchanged   Morbid Obesity: BMI above 35 with co-morbidities. Weight is stable, she has been walking for 20 minutes daily   Patient Active Problem List   Diagnosis Date Noted  . Plantar fasciitis,  bilateral 05/21/2019  . Age-related osteoporosis without current pathological fracture 11/05/2017  . 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([place]) 07/09/2017  . Atherosclerosis of abdominal aorta ([place]) 07/09/2017  . Bilateral carotid bruits 08/24/2015  . 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 Atrial flutter ([place]) 02/22/2015  . Chronic pain 01/10/2015  . 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 Fatigue 01/10/2015  . History of melanoma excision 01/10/2015  . Morbid obesity ([place]) 01/10/2015  . 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 Spinal stenosis, lumbar region, 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  . DDD (degenerative disc disease), lumbar 11/14/2014  . Greater trochanteric bursitis of both hips 11/14/2014  . Piriformis syndrome 11/14/2014  . 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 Hyperlipemia 11/07/2009  . Hypertension, benign 11/07/2009  . Atherosclerosis of renal artery ([place]) 11/07/2009  . Hyperlipidemia due to type 2 diabetes mellitus ([place]) 11/07/2009  . Perennial allergic rhinitis 11/27/2007  . Lumbosacral neuritis 01/17/2007    Past Surgical History:  Procedure Laterality Date  . APPENDECTOMY    . CARDIAC CATHETERIZATION    . cataract surgery    . COLONOSCOPY    . COLONOSCOPY WITH PROPOFOL N/A 05/09/2016   Procedure: COLONOSCOPY WITH PROPOFOL;  Surgeon: Robert Bellow, MD;  Location: [facility] ENDOSCOPY;  Service: Endoscopy;  Laterality: N/A;  . DIAGNOSTIC MAMMOGRAM    . EYE SURGERY Bilateral    Cataract Extraction  . KNEE ARTHROPLASTY Right 03/30/2015   Procedure: COMPUTER ASSISTED TOTAL KNEE  ARTHROPLASTY;  Surgeon: Dereck Leep, MD;  Location: [facility] ORS;  Service: Orthopedics;  Laterality: Right;  . KNEE ARTHROSCOPY Right   . KNEE CLOSED REDUCTION Right 05/23/2015   Procedure: CLOSED MANIPULATION KNEE;  Surgeon: Dereck Leep, MD;  Location: [facility] ORS;  Service: Orthopedics;  Laterality: Right;    . TONSILLECTOMY      Family History  Problem Relation Age of Onset  . Diabetes Mother   . Hypertension Mother   . Cancer Mother   . Hypertension Father   . Cancer Father        Prostate  . Breast cancer Maternal Aunt 4  . Colon cancer Son   . Kidney cancer Neg Hx   . Bladder Cancer Neg Hx     Social History   Socioeconomic History  . Marital status: Married    Spouse name: Emergency planning/management officer  . Number of children: 1  . Years of education: Not on file  . Highest education level: Master's degree (e.g., MA, MS, MEng, MEd, MSW, MBA)  Occupational History  . Occupation: Retired  Tobacco Use  . Smoking status: Never Smoker  . Smokeless tobacco: Never Used  Substance and Sexual Activity  . Alcohol use: Not Currently    Alcohol/week: 0.0 standard drinks  . Drug use: No  . Sexual activity: Yes    Partners: Male  Other Topics Concern  . Not on file  Social History Narrative   She is married , only has one son and he was diagnosed with colon cancer at age 67.    Social Determinants of Health   Financial Resource Strain: Low Risk   . Difficulty of Paying Living Expenses: Not very hard  Food Insecurity: No Food Insecurity  . Worried About Charity fundraiser in the Last Year: Never true  . Ran Out of Food in the Last Year: Never true  Transportation Needs: No Transportation Needs  . Lack of Transportation (Medical): No  . Lack of Transportation (Non-Medical): No  Physical Activity: Insufficiently Active  . Days of Exercise per Week: 7 days  . Minutes of Exercise per Session: 20 min  Stress: No Stress Concern Present  . Feeling of Stress : Not at all  Social Connections: Not Isolated  . Frequency of Communication with Friends and Family: More than three times a week  . Frequency of Social Gatherings with Friends and Family: More than three times a week  . Attends Religious Services: More than 4 times per year  . Active Member of Clubs or Organizations: Yes  . Attends Theatre manager Meetings: More than 4 times per year  . Marital Status: Married  Human resources officer Violence: Not At Risk  . Fear of Current or Ex-Partner: No  . Emotionally Abused: No  . Physically Abused: No  . Sexually Abused: No     Current Outpatient Medications:  .  acetaminophen (TYLENOL) 500 MG tablet, Take 500 mg by mouth every 6 (six) hours as needed., Disp: , Rfl:  .  ALFALFA PO, Take 3 tablets by mouth every morning., Disp: , Rfl:  .  aspirin 81 MG EC tablet, Take 81 mg by mouth at bedtime. , Disp: , Rfl:  .  B Complex-C (B-COMPLEX WITH VITAMIN C) tablet, Take 2 tablets by mouth at bedtime. , Disp: , Rfl:  .  BD PEN NEEDLE NANO U/F 32G X 4 MM MISC, USE 4 TIMES DAILY (HUMALOG 3 TIMES DAILY AND LANTUS ONCE DAILY) OFFICE NOTIFIED 08/06/17, Disp: 90 each,  Rfl: 1 .  BLACK ELDERBERRY,BERRY-FLOWER, PO, Take by mouth., Disp: , Rfl:  .  BYDUREON 2 MG PEN, INJECT 2 MG SUBCUTANEOUSLY ONCE A WEEK, Disp: , Rfl:  .  calcium carbonate (OS-CAL) 600 MG TABS tablet, Take 600 mg by mouth daily., Disp: , Rfl:  .  cholecalciferol (VITAMIN D) 1000 UNITS tablet, Take 4,000 Units by mouth daily. , Disp: , Rfl:  .  clobetasol ointment (TEMOVATE) AB-123456789 %, APPLY 1 APPLICATION TOPICALLY 2 (TWO) TIMES DAILY AS NEEDED (BUG BITES)., Disp: 30 g, Rfl: 0 .  Coenzyme Q10 (COQ10) 200 MG CAPS, Take 1 tablet by mouth daily. 400 mg every am., Disp: , Rfl:  .  Continuous Blood Gluc Sensor ([place]) MISC, by Does not apply route., Disp: , Rfl:  .  Elastic Bandages & Supports ([place]) MISC, 1 each by Does not apply route daily., Disp: 2 each, Rfl: 5 .  estradiol (ESTRACE VAGINAL) 0.1 MG/GM vaginal cream, 1/2 gm once weekly using applicator, apply blueberry sized amount of cream using tip of finger to urethra twice weekly, Disp: 50 g, Rfl: 4 .  Eyelid Cleansers (AVENOVA) 0.01 % SOLN, See admin instructions., Disp: , Rfl: 3 .  fish oil-omega-3 fatty acids 1000 MG capsule, Take 2 g by  mouth daily.  , Disp: , Rfl:  .  fluticasone (FLONASE) 50 MCG/ACT nasal spray, PLACE 2 SPRAYS INTO BOTH NOSTRILS DAILY., Disp: 8.5 g, Rfl: 2 .  GARLIC 99991111 PO, Take 1 tablet by mouth daily. 1 gram, Disp: , Rfl:  .  Glucosamine 500 MG TABS, Take 1 tablet by mouth daily. , Disp: , Rfl:  .  HUMALOG KWIKPEN 100 UNIT/ML KiwkPen, Inject 6 Units into the skin 3 (three) times daily after meals. Pt taking before meals per Dr. Honor Junes, Disp: , Rfl: 3 .  Insulin Glargine (LANTUS SOLOSTAR) 100 UNIT/ML Solostar Pen, Inject 24 Units into the skin every morning. (Patient taking differently: Inject 22 Units into the skin every morning. ), Disp: 15 pen, Rfl: 2 .  losartan-hydrochlorothiazide (HYZAAR) 100-25 MG tablet, TAKE 1 TABLET BY MOUTH EVERY DAY, Disp: 90 tablet, Rfl: 1 .  Mag Aspart-Potassium Aspart (POTASSIUM & MAGNESIUM ASPARTAT PO), Take 1 tablet by mouth daily., Disp: , Rfl:  .  metoprolol succinate (TOPROL-XL) 25 MG 24 hr tablet, TAKE 1 TABLET BY MOUTH EVERY DAY, Disp: 90 tablet, Rfl: 2 .  Multiple Vitamins-Minerals (PRESERVISION AREDS 2) CAPS, Take 1 capsule by mouth 2 (two) times daily., Disp: , Rfl:  .  nystatin cream (MYCOSTATIN), Apply 1 application topically 2 (two) times daily., Disp: 60 g, Rfl: 2 .  Probiotic Product (PROBIOTIC PO), Take 1 Dose by mouth at bedtime., Disp: , Rfl:  .  Propylene Glycol (SYSTANE BALANCE OP), Apply 1 drop to eye as needed., Disp: , Rfl:  .  rosuvastatin (CRESTOR) 10 MG tablet, Take 1 tablet (10 mg total) by mouth daily., Disp: 90 tablet, Rfl: 1 .  sodium fluoride (DENTAGEL) 1.1 % GEL dental gel, , Disp: , Rfl:  .  sulfacetamide (BLEPH-10) 10 % ophthalmic solution, Place 1 drop into both eyes 3 (three) times daily as needed., Disp: , Rfl:  .  Vitamin D, Ergocalciferol, (DRISDOL) 1.25 MG (50000 UT) CAPS capsule, Take 1 capsule by mouth once a week., Disp: , Rfl:  .  vitamin E (VITAMIN E) 400 UNIT capsule, Take 400 Units by mouth daily. Every am, Disp: , Rfl:  .   Zoledronic Acid (RECLAST IV), Inject into the vein. Every  year, Disp: , Rfl:   Allergies  Allergen Reactions  . Cyclobenzaprine Hypertension  . Cheese Other (See Comments)    bloating  . Ciprofloxacin Hcl Other (See Comments)    Muscle pain  . Coconut Oil   . Keflex [Cephalexin] Other (See Comments)    Patient prefers not to take this medication due to the side effects    I personally reviewed active problem list, medication list, allergies, family history, social history, health maintenance with the patient/caregiver today.   ROS  Ten systems reviewed and is negative except as mentioned in HPI   Objective  Virtual encounter, vitals  Obtained.  Vitals:   07/10/19 0805  Temp: 97.8 F (36.6 C)    Body mass index is 36.14 kg/m.  Physical Exam  Awake, alert and oriented   PHQ2/9: Depression screen [facility] 2/9 07/10/2019 04/17/2019 01/08/2019 07/10/2018 04/11/2018  Decreased Interest 0 0 0 0 0  Down, Depressed, Hopeless 0 0 0 0 0  PHQ - 2 Score 0 0 0 0 0  Altered sleeping 0 - 0 - -  Tired, decreased energy 0 - 0 - -  Change in appetite 0 - 0 - -  Feeling bad or failure about yourself  0 - 0 - -  Trouble concentrating 0 - 0 - -  Moving slowly or fidgety/restless 0 - 0 - -  Suicidal thoughts 0 - 0 - -  PHQ-9 Score 0 - 0 - -   PHQ-2/9 Result is negative.    Fall Risk: Fall Risk  07/10/2019 04/17/2019 01/08/2019 07/10/2018 04/11/2018  Falls in the past year? 0 0 0 0 No  Comment - - - - -  Number falls in past yr: 0 0 0 0 -  Injury with Fall? 0 0 0 0 -  Risk for fall due to : - - - - -  Risk for fall due to: Comment - - - - -  Follow up - Falls prevention discussed - - -     Assessment & Plan  1. 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(HCC)  Stable   2. Dyslipidemia associated with type 2 diabetes mellitus ([place])  Continue medications and follow up with Dr. Honor Junes   3. Essential hypertension  She will try to get a new machine   4. Osteoporosis, post-menopausal  Keep follow up with  Dr. Honor Junes   5. Atherosclerosis of abdominal aorta (HCC)  Taking Crestor daily and needs to have labs rechecked   6. Morbid obesity ([place])  Discussed with the patient the risk posed by an increased BMI. Discussed importance of portion control, calorie counting and at least 150 minutes of physical activity weekly. Avoid sweet beverages and drink more water. Eat at least 6 servings of fruit and vegetables daily   7. Dyslipidemia  Continue medications   8. Candida infection of flexural skin  She wants to have it in hand  - fluconazole (DIFLUCAN) 150 MG tablet; Take 1 tablet (150 mg total) by mouth every other day.  Dispense: 3 tablet; Refill: 0  I discussed the assessment and treatment plan with the patient. The patient was provided an opportunity to ask questions and all were answered. The patient agreed with the plan and demonstrated an understanding of the instructions.  The patient was advised to call back or seek an in-person evaluation if the symptoms worsen or if the condition fails to improve as anticipated.  I provided 25 minutes of non-face-to-face time during this encounter.

## 2019-07-14 DIAGNOSIS — Z23 Encounter for immunization: Secondary | ICD-10-CM | POA: Diagnosis not present

## 2019-07-16 DIAGNOSIS — M9903 Segmental and somatic dysfunction of lumbar region: Secondary | ICD-10-CM | POA: Diagnosis not present

## 2019-07-16 DIAGNOSIS — M9901 Segmental and somatic dysfunction of cervical region: Secondary | ICD-10-CM | POA: Diagnosis not present

## 2019-07-16 DIAGNOSIS — M5416 Radiculopathy, lumbar region: Secondary | ICD-10-CM | POA: Diagnosis not present

## 2019-07-16 DIAGNOSIS — M5033 Other cervical disc degeneration, cervicothoracic region: Secondary | ICD-10-CM | POA: Diagnosis not present

## 2019-07-23 DIAGNOSIS — M9901 Segmental and somatic dysfunction of cervical region: Secondary | ICD-10-CM | POA: Diagnosis not present

## 2019-07-23 DIAGNOSIS — M5416 Radiculopathy, lumbar region: Secondary | ICD-10-CM | POA: Diagnosis not present

## 2019-07-23 DIAGNOSIS — M5033 Other cervical disc degeneration, cervicothoracic region: Secondary | ICD-10-CM | POA: Diagnosis not present

## 2019-07-23 DIAGNOSIS — M9903 Segmental and somatic dysfunction of lumbar region: Secondary | ICD-10-CM | POA: Diagnosis not present

## 2019-07-27 ENCOUNTER — Other Ambulatory Visit: Payer: Self-pay | Admitting: Family Medicine

## 2019-07-27 DIAGNOSIS — W57XXXD Bitten or stung by nonvenomous insect and other nonvenomous arthropods, subsequent encounter: Secondary | ICD-10-CM

## 2019-08-06 DIAGNOSIS — M5416 Radiculopathy, lumbar region: Secondary | ICD-10-CM | POA: Diagnosis not present

## 2019-08-06 DIAGNOSIS — M9901 Segmental and somatic dysfunction of cervical region: Secondary | ICD-10-CM | POA: Diagnosis not present

## 2019-08-06 DIAGNOSIS — M5033 Other cervical disc degeneration, cervicothoracic region: Secondary | ICD-10-CM | POA: Diagnosis not present

## 2019-08-06 DIAGNOSIS — M9903 Segmental and somatic dysfunction of lumbar region: Secondary | ICD-10-CM | POA: Diagnosis not present

## 2019-08-11 DIAGNOSIS — Z23 Encounter for immunization: Secondary | ICD-10-CM | POA: Diagnosis not present

## 2019-08-13 DIAGNOSIS — M9903 Segmental and somatic dysfunction of lumbar region: Secondary | ICD-10-CM | POA: Diagnosis not present

## 2019-08-13 DIAGNOSIS — M9901 Segmental and somatic dysfunction of cervical region: Secondary | ICD-10-CM | POA: Diagnosis not present

## 2019-08-13 DIAGNOSIS — M5033 Other cervical disc degeneration, cervicothoracic region: Secondary | ICD-10-CM | POA: Diagnosis not present

## 2019-08-13 DIAGNOSIS — M5416 Radiculopathy, lumbar region: Secondary | ICD-10-CM | POA: Diagnosis not present

## 2019-08-18 ENCOUNTER — Ambulatory Visit
Admission: RE | Admit: 2019-08-18 | Discharge: 2019-08-18 | Disposition: A | Discharge status: Home / Self Care | Payer: Medicare Other | Source: Ambulatory Visit | Attending: Family Medicine | Admitting: Family Medicine

## 2019-08-18 DIAGNOSIS — Z1231 Encounter for screening mammogram for malignant neoplasm of breast: Secondary | ICD-10-CM | POA: Diagnosis not present

## 2019-08-21 DIAGNOSIS — E1169 Type 2 diabetes mellitus with other specified complication: Secondary | ICD-10-CM | POA: Diagnosis not present

## 2019-08-21 DIAGNOSIS — M810 Age-related osteoporosis without current pathological fracture: Secondary | ICD-10-CM | POA: Diagnosis not present

## 2019-08-21 DIAGNOSIS — E785 Hyperlipidemia, unspecified: Secondary | ICD-10-CM | POA: Diagnosis not present

## 2019-08-21 DIAGNOSIS — Z794 Long term (current) use of insulin: Secondary | ICD-10-CM | POA: Diagnosis not present

## 2019-08-21 DIAGNOSIS — I10 Essential (primary) hypertension: Secondary | ICD-10-CM | POA: Diagnosis not present

## 2019-08-21 DIAGNOSIS — E1165 Type 2 diabetes mellitus with hyperglycemia: Secondary | ICD-10-CM | POA: Diagnosis not present

## 2019-08-21 DIAGNOSIS — E559 Vitamin D deficiency, unspecified: Secondary | ICD-10-CM | POA: Diagnosis not present

## 2019-08-21 DIAGNOSIS — E1159 Type 2 diabetes mellitus with other circulatory complications: Secondary | ICD-10-CM | POA: Diagnosis not present

## 2019-08-21 LAB — MICROALBUMIN, URINE: Microalb, Ur: NEGATIVE

## 2019-08-21 LAB — HEMOGLOBIN A1C: Hemoglobin A1C: 6.7

## 2019-08-26 ENCOUNTER — Ambulatory Visit: Payer: Medicare Other | Admitting: Cardiovascular Disease

## 2019-08-27 DIAGNOSIS — I10 Essential (primary) hypertension: Secondary | ICD-10-CM | POA: Diagnosis not present

## 2019-08-27 DIAGNOSIS — M9903 Segmental and somatic dysfunction of lumbar region: Secondary | ICD-10-CM | POA: Diagnosis not present

## 2019-08-27 DIAGNOSIS — M810 Age-related osteoporosis without current pathological fracture: Secondary | ICD-10-CM | POA: Diagnosis not present

## 2019-08-27 DIAGNOSIS — E785 Hyperlipidemia, unspecified: Secondary | ICD-10-CM | POA: Diagnosis not present

## 2019-08-27 DIAGNOSIS — E1165 Type 2 diabetes mellitus with hyperglycemia: Secondary | ICD-10-CM | POA: Diagnosis not present

## 2019-08-27 DIAGNOSIS — E1169 Type 2 diabetes mellitus with other specified complication: Secondary | ICD-10-CM | POA: Diagnosis not present

## 2019-08-27 DIAGNOSIS — M5416 Radiculopathy, lumbar region: Secondary | ICD-10-CM | POA: Diagnosis not present

## 2019-08-27 DIAGNOSIS — M5033 Other cervical disc degeneration, cervicothoracic region: Secondary | ICD-10-CM | POA: Diagnosis not present

## 2019-08-27 DIAGNOSIS — Z794 Long term (current) use of insulin: Secondary | ICD-10-CM | POA: Diagnosis not present

## 2019-08-27 DIAGNOSIS — E1159 Type 2 diabetes mellitus with other circulatory complications: Secondary | ICD-10-CM | POA: Diagnosis not present

## 2019-08-27 DIAGNOSIS — M9901 Segmental and somatic dysfunction of cervical region: Secondary | ICD-10-CM | POA: Diagnosis not present

## 2019-09-01 DIAGNOSIS — Z20828 Contact with and (suspected) exposure to other viral communicable diseases: Secondary | ICD-10-CM | POA: Diagnosis not present

## 2019-09-09 ENCOUNTER — Telehealth: Payer: Self-pay | Admitting: Family Medicine

## 2019-09-09 DIAGNOSIS — B372 Candidiasis of skin and nail: Secondary | ICD-10-CM

## 2019-09-09 MED ORDER — FLUCONAZOLE 150 MG PO TABS: 150.0000 mg | ORAL_TABLET | ORAL | 0 refills | Status: DC

## 2019-09-09 NOTE — Telephone Encounter (Signed)
Patient requesting diflucan due to yeast infection, patient stated PCP has prescribed in the past, please follow up with patient [id number] [address], [place] Phone:  [phone]  Fax:  [phone]

## 2019-09-10 DIAGNOSIS — M9901 Segmental and somatic dysfunction of cervical region: Secondary | ICD-10-CM | POA: Diagnosis not present

## 2019-09-10 DIAGNOSIS — M5416 Radiculopathy, lumbar region: Secondary | ICD-10-CM | POA: Diagnosis not present

## 2019-09-10 DIAGNOSIS — M5033 Other cervical disc degeneration, cervicothoracic region: Secondary | ICD-10-CM | POA: Diagnosis not present

## 2019-09-10 DIAGNOSIS — M1711 Unilateral primary osteoarthritis, right knee: Secondary | ICD-10-CM | POA: Diagnosis not present

## 2019-09-10 DIAGNOSIS — M9903 Segmental and somatic dysfunction of lumbar region: Secondary | ICD-10-CM | POA: Diagnosis not present

## 2019-09-10 DIAGNOSIS — Z96651 Presence of right artificial knee joint: Secondary | ICD-10-CM | POA: Diagnosis not present

## 2019-09-14 ENCOUNTER — Ambulatory Visit (INDEPENDENT_AMBULATORY_CARE_PROVIDER_SITE_OTHER): Payer: Medicare Other

## 2019-09-14 ENCOUNTER — Other Ambulatory Visit: Payer: Self-pay

## 2019-09-14 DIAGNOSIS — I701 Atherosclerosis of renal artery: Secondary | ICD-10-CM

## 2019-09-15 DIAGNOSIS — M810 Age-related osteoporosis without current pathological fracture: Secondary | ICD-10-CM | POA: Diagnosis not present

## 2019-09-16 ENCOUNTER — Encounter: Payer: Self-pay | Admitting: Cardiovascular Disease

## 2019-09-16 ENCOUNTER — Other Ambulatory Visit: Payer: Self-pay

## 2019-09-16 ENCOUNTER — Ambulatory Visit (INDEPENDENT_AMBULATORY_CARE_PROVIDER_SITE_OTHER): Payer: Medicare Other | Admitting: Cardiovascular Disease

## 2019-09-16 VITALS — BP 150/62 | HR 67 | Ht 63.0 in | Wt 202.4 lb

## 2019-09-16 DIAGNOSIS — I10 Essential (primary) hypertension: Secondary | ICD-10-CM

## 2019-09-16 DIAGNOSIS — I701 Atherosclerosis of renal artery: Secondary | ICD-10-CM | POA: Diagnosis not present

## 2019-09-16 DIAGNOSIS — E782 Mixed hyperlipidemia: Secondary | ICD-10-CM

## 2019-09-16 DIAGNOSIS — I4892 Unspecified atrial flutter: Secondary | ICD-10-CM

## 2019-09-16 MED ORDER — METOPROLOL SUCCINATE ER 25 MG PO TB24: 25.0000 mg | ORAL_TABLET | Freq: Every day | ORAL | 3 refills | Status: DC

## 2019-09-16 NOTE — Progress Notes (Signed)
Cardiology Office Note  Date:  09/16/2019   ID:  Bonnie Rowland, Bonnie Rowland [birth year], MRN [id number]  PCP:  Steele Sizer, MD   Chief Complaint  Patient presents with  . Other    12 month follow up. Patietn c/o some chest tightness. meds reviewed verbally with patient.     HPI:  Bonnie Rowland is a very pleasant 79 y.o. woman with a history of chest pain,  normal coronary arteries by cardiac catheterization April 2007 ,  < 50%  renal artery disease,  check 2021 Carotid 2017 <1-39 b/l hyperlipidemia,  diabetes,  hypertension,  obesity  F/u of for atrial flutter, status post right total knee replacement surgery  Currently living at [facility]  Walking 1 mile daily Denies any significant shortness of breath her chest discomfort on exertion  No arrhythmia, On metoprolol Rare tightness, possible posture  Pressure at home 128/60 Elevated today on long walk into the office  Lab work reviewed in detail Total Chol 129/ LDL 57 HBA1C 6.5  Renal artery stable 1-59 on the left Ultrasound reviewed  Carotid ultrasound reviewed from 2017, less than 39% disease bilaterally  EKG personally reviewed by myself on todays visit Shows normal sinus rhythm. 67 bpm. , pvc, PAC  Other past medical history reviewed 02/21/15 she presented to [facility] for right total knee replacement by Dr. Marry Guan. She reported developing tachycardia that morning. She had extreme pain, was rushing to get to the hospital. EKG showed atrial flutter with ventricular rate 140 bpm. She was sent to the emergency room, 3 hours later was back in normal sinus rhythm. No medications were given to restore normal sinus rhythm.  echocardiogram in May 2006 which was essentially normal with ejection fraction 60%.   Stress test in 06, 03 and 01 had been normal.  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  PSH:    Past Surgical History:  Procedure Laterality Date  . APPENDECTOMY    . CARDIAC CATHETERIZATION    . cataract surgery    . COLONOSCOPY    . COLONOSCOPY WITH PROPOFOL N/A 05/09/2016   Procedure: COLONOSCOPY WITH PROPOFOL;  Surgeon: Robert Bellow, MD;  Location: [facility] ENDOSCOPY;  Service: Endoscopy;  Laterality: N/A;  . DIAGNOSTIC MAMMOGRAM    . EYE SURGERY Bilateral    Cataract Extraction  . KNEE ARTHROPLASTY Right 03/30/2015   Procedure: COMPUTER ASSISTED TOTAL KNEE ARTHROPLASTY;  Surgeon: Dereck Leep, MD;  Location: [facility] ORS;  Service: Orthopedics;  Laterality: Right;  . KNEE ARTHROSCOPY Right   . KNEE CLOSED REDUCTION Right 05/23/2015   Procedure: CLOSED MANIPULATION KNEE;  Surgeon: Dereck Leep, MD;  Location: [facility] ORS;  Service: Orthopedics;  Laterality: Right;  . TONSILLECTOMY      Current Outpatient Medications  Medication Sig Dispense Refill  . acetaminophen (TYLENOL) 500 MG tablet Take 500 mg by mouth every 6 (six) hours as needed.    Marland Kitchen ALFALFA PO Take 3 tablets by mouth every morning.    Marland Kitchen aspirin 81 MG EC tablet Take 81 mg by mouth at bedtime.     . B Complex-C (  B-COMPLEX WITH VITAMIN C) tablet Take 2 tablets by mouth at bedtime.     . BD PEN NEEDLE NANO U/F 32G X 4 MM MISC USE 4 TIMES DAILY (HUMALOG 3 TIMES DAILY AND LANTUS ONCE DAILY) OFFICE NOTIFIED 08/06/17 90 each 1  . BLACK ELDERBERRY,BERRY-FLOWER, PO Take by mouth.    Marland Kitchen BYDUREON 2 MG PEN INJECT 2 MG SUBCUTANEOUSLY ONCE A WEEK    . calcium carbonate (OS-CAL) 600 MG TABS tablet Take 600 mg by mouth  daily.    . cholecalciferol (VITAMIN D) 1000 UNITS tablet Take 4,000 Units by mouth daily.     . clobetasol ointment (TEMOVATE) AB-123456789 % APPLY 1 APPLICATION TOPICALLY 2 (TWO) TIMES DAILY AS NEEDED (BUG BITES). 30 g 0  . Coenzyme Q10 (COQ10) 200 MG CAPS Take 1 tablet by mouth daily. 400 mg every am.    . Continuous Blood Gluc Sensor ([place]) MISC by Does not apply route.    Regino Schultze Bandages & Supports (MEDICAL COMPRESSION STOCKINGS) MISC 1 each by Does not apply route daily. 2 each 5  . estradiol (ESTRACE VAGINAL) 0.1 MG/GM vaginal cream 1/2 gm once weekly using applicator, apply blueberry sized amount of cream using tip of finger to urethra twice weekly 50 g 4  . Eyelid Cleansers (AVENOVA) 0.01 % SOLN See admin instructions.  3  . fish oil-omega-3 fatty acids 1000 MG capsule Take 2 g by mouth daily.      . fluconazole (DIFLUCAN) 150 MG tablet Take 1 tablet (150 mg total) by mouth every other day. 3 tablet 0  . fluticasone (FLONASE) 50 MCG/ACT nasal spray PLACE 2 SPRAYS INTO BOTH NOSTRILS DAILY. 8.5 g 2  . GARLIC 99991111 PO Take 1 tablet by mouth daily. 1 gram    . Glucosamine 500 MG TABS Take 1 tablet by mouth daily.     Marland Kitchen HUMALOG KWIKPEN 100 UNIT/ML KiwkPen Inject 6 Units into the skin 3 (three) times daily after meals. Pt taking before meals per Dr. Honor Junes  3  . Insulin Glargine (LANTUS SOLOSTAR) 100 UNIT/ML Solostar Pen Inject 24 Units into the skin every morning. (Patient taking differently: Inject 22 Units into the skin every morning. ) 15 pen 2  . losartan-hydrochlorothiazide (HYZAAR) 100-25 MG tablet TAKE 1 TABLET BY MOUTH EVERY DAY 90 tablet 1  . Mag Aspart-Potassium Aspart (POTASSIUM & MAGNESIUM ASPARTAT PO) Take 1 tablet by mouth daily.    . metoprolol succinate (TOPROL-XL) 25 MG 24 hr tablet TAKE 1 TABLET BY MOUTH EVERY DAY 90 tablet 2  . Multiple Vitamins-Minerals (PRESERVISION AREDS 2) CAPS Take 1 capsule by mouth 2 (two) times daily.    Marland Kitchen nystatin cream  (MYCOSTATIN) Apply 1 application topically 2 (two) times daily. 60 g 2  . Probiotic Product (PROBIOTIC PO) Take 1 Dose by mouth at bedtime.    Marland Kitchen PROLIA 60 MG/ML SOSY injection     . Propylene Glycol (SYSTANE BALANCE OP) Apply 1 drop to eye as needed.    . rosuvastatin (CRESTOR) 10 MG tablet Take 1 tablet (10 mg total) by mouth daily. 90 tablet 1  . sodium fluoride (DENTAGEL) 1.1 % GEL dental gel     . sulfacetamide (BLEPH-10) 10 % ophthalmic solution Place 1 drop into both eyes 3 (three) times daily as needed.    . Vitamin D, Ergocalciferol, (DRISDOL) 1.25 MG (50000 UT) CAPS capsule Take 1 capsule by mouth once a week.    . vitamin E (VITAMIN E) 400 UNIT capsule Take 400 Units  by mouth daily. Every am     No current facility-administered medications for this visit.     Allergies:   Cyclobenzaprine, Cheese, Ciprofloxacin hcl, Coconut oil, and Keflex [cephalexin]   Social History:  The patient  reports that she has never smoked. She has never used smokeless tobacco. She reports previous alcohol use. She reports that she does not use drugs.   Family History:   family history includes Breast cancer (age of onset: 63) in her maternal aunt; Cancer in her father and mother; Colon cancer in her son; Diabetes in her mother; Hypertension in her father and mother.    Review of Systems: Review of Systems  Constitutional: Negative.   Eyes: Negative.   Respiratory: Negative.  Negative for shortness of breath.   Cardiovascular: Negative.   Gastrointestinal: Negative.   Genitourinary: Negative.   Musculoskeletal: Positive for joint pain.  Neurological: Negative.   Psychiatric/Behavioral: Negative.   All other systems reviewed and are negative.   PHYSICAL EXAM: VS:  BP (!) 150/62 (BP Location: Left Arm, Patient Position: Sitting, Cuff Size: Large)   Pulse 67   Ht 5\' 3"  (1.6 m)   Wt 202 lb 6 oz (91.8 kg)   SpO2 95%   BMI 35.85 kg/m  , BMI Body mass index is 35.85 kg/m. Constitutional:   oriented to person, place, and time. No distress.  HENT:  Head: Grossly normal Eyes:  no discharge. No scleral icterus.  Neck: No JVD, no carotid bruits  Cardiovascular: Regular rate and rhythm, no murmurs appreciated Pulmonary/Chest: Clear to auscultation bilaterally, no wheezes or rails Abdominal: Soft.  no distension.  no tenderness.  Musculoskeletal: Normal range of motion Neurological:  normal muscle tone. Coordination normal. No atrophy Skin: Skin warm and dry Psychiatric: normal affect, pleasant   Recent Labs: No results found for requested labs within last 8760 hours.    Lipid Panel Lab Results  Component Value Date   CHOL 154 11/20/2018   HDL 71 (A) 11/20/2018   LDLCALC 57 11/20/2018   TRIG 129 11/20/2018      Wt Readings from Last 3 Encounters:  09/16/19 202 lb 6 oz (91.8 kg)  07/10/19 204 lb (92.5 kg)  06/11/19 206 lb (93.4 kg)      ASSESSMENT AND PLAN:  Hyperlipidemia, unspecified hyperlipidemia type  Cholesterol is at goal on the current lipid regimen. No changes to the medications were made.  Hypertension, benign  Blood pressure is well controlled on today's visit. No changes made to the medications.  Atrial flutter, unspecified type [facility])   following surgery on her knee Off flecainide, maintaining normal sinus rhythm Continue metoprolol, no further work-up needed Not on anticoagulation  Atherosclerosis of renal artery (HCC) Less than 60% on ultrasound Prior ultrasound 2016, no change on ultrasound 2021 Cholesterol and lipids at good level  Uncontrolled type 2 diabetes mellitus 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, without long-term current use of insulin, unspecified laterality ([place]) Numbers are doing much better Hemoglobin A1c previously 10 Now well controlled Walking on a regular basis  Bilateral carotid bruits Less than 39% bilaterally on ultrasound 2017 No need for recheck at this time  Total encounter time  more than 25 minutes Greater than 50% was spent in counseling and coordination of care with the patient   Disposition:   F/U  12 months   Orders Placed This Encounter  Procedures  . EKG 12-Lead   Margit Banda  Is acting as a scribe for Ida Rogue, M.D., Ph.D.  I have reviewed the above documentation for accuracy and completeness, and I agree with the above.   Signed, Esmond Plants, M.D., Ph.D. 09/16/2019  [place], [place]

## 2019-09-16 NOTE — Patient Instructions (Signed)

## 2019-09-17 DIAGNOSIS — M9901 Segmental and somatic dysfunction of cervical region: Secondary | ICD-10-CM | POA: Diagnosis not present

## 2019-09-17 DIAGNOSIS — M9903 Segmental and somatic dysfunction of lumbar region: Secondary | ICD-10-CM | POA: Diagnosis not present

## 2019-09-17 DIAGNOSIS — M5416 Radiculopathy, lumbar region: Secondary | ICD-10-CM | POA: Diagnosis not present

## 2019-09-17 DIAGNOSIS — M5033 Other cervical disc degeneration, cervicothoracic region: Secondary | ICD-10-CM | POA: Diagnosis not present

## 2019-10-08 DIAGNOSIS — M9901 Segmental and somatic dysfunction of cervical region: Secondary | ICD-10-CM | POA: Diagnosis not present

## 2019-10-08 DIAGNOSIS — M9903 Segmental and somatic dysfunction of lumbar region: Secondary | ICD-10-CM | POA: Diagnosis not present

## 2019-10-08 DIAGNOSIS — M5416 Radiculopathy, lumbar region: Secondary | ICD-10-CM | POA: Diagnosis not present

## 2019-10-08 DIAGNOSIS — M5033 Other cervical disc degeneration, cervicothoracic region: Secondary | ICD-10-CM | POA: Diagnosis not present

## 2019-10-22 DIAGNOSIS — M5416 Radiculopathy, lumbar region: Secondary | ICD-10-CM | POA: Diagnosis not present

## 2019-10-22 DIAGNOSIS — M9901 Segmental and somatic dysfunction of cervical region: Secondary | ICD-10-CM | POA: Diagnosis not present

## 2019-10-22 DIAGNOSIS — M5033 Other cervical disc degeneration, cervicothoracic region: Secondary | ICD-10-CM | POA: Diagnosis not present

## 2019-10-22 DIAGNOSIS — M9903 Segmental and somatic dysfunction of lumbar region: Secondary | ICD-10-CM | POA: Diagnosis not present

## 2019-11-02 DIAGNOSIS — Z85828 Personal history of other malignant neoplasm of skin: Secondary | ICD-10-CM | POA: Diagnosis not present

## 2019-11-02 DIAGNOSIS — X32XXXA Exposure to sunlight, initial encounter: Secondary | ICD-10-CM | POA: Diagnosis not present

## 2019-11-02 DIAGNOSIS — Z85820 Personal history of malignant melanoma of skin: Secondary | ICD-10-CM | POA: Diagnosis not present

## 2019-11-02 DIAGNOSIS — D2262 Melanocytic nevi of left upper limb, including shoulder: Secondary | ICD-10-CM | POA: Diagnosis not present

## 2019-11-02 DIAGNOSIS — D2271 Melanocytic nevi of right lower limb, including hip: Secondary | ICD-10-CM | POA: Diagnosis not present

## 2019-11-02 DIAGNOSIS — L570 Actinic keratosis: Secondary | ICD-10-CM | POA: Diagnosis not present

## 2019-11-02 DIAGNOSIS — D225 Melanocytic nevi of trunk: Secondary | ICD-10-CM | POA: Diagnosis not present

## 2019-11-02 DIAGNOSIS — D2261 Melanocytic nevi of right upper limb, including shoulder: Secondary | ICD-10-CM | POA: Diagnosis not present

## 2019-11-04 ENCOUNTER — Telehealth: Payer: Self-pay | Admitting: Family Medicine

## 2019-11-04 NOTE — Chronic Care Management (AMB) (Signed)
  Chronic Care Management   Outreach Note  11/04/2019 Name: Bonnie Rowland MRN: [id number] DOB: [birth year]  Bonnie Rowland is a 79 y.o. year old female who is a primary care patient of Steele Sizer, MD. I reached out to [name] by phone today in response to a referral sent by [name]'s health plan.     An unsuccessful telephone outreach was attempted today. The patient was referred to the case management team for assistance with care management and care coordination.   Follow Up Plan: A HIPPA compliant phone message was left for the patient providing contact information and requesting a return call.  The care management team will reach out to the patient again over the next 7 days.  If patient returns call to provider office, please advise to call [place] at [place], [place], [place], [place] [address] Direct Dial: [phone] [email or web address] Website: [email or web address]

## 2019-11-05 DIAGNOSIS — M9903 Segmental and somatic dysfunction of lumbar region: Secondary | ICD-10-CM | POA: Diagnosis not present

## 2019-11-05 DIAGNOSIS — M9901 Segmental and somatic dysfunction of cervical region: Secondary | ICD-10-CM | POA: Diagnosis not present

## 2019-11-05 DIAGNOSIS — M5416 Radiculopathy, lumbar region: Secondary | ICD-10-CM | POA: Diagnosis not present

## 2019-11-05 DIAGNOSIS — M5033 Other cervical disc degeneration, cervicothoracic region: Secondary | ICD-10-CM | POA: Diagnosis not present

## 2019-11-09 ENCOUNTER — Other Ambulatory Visit: Payer: Self-pay | Admitting: Family Medicine

## 2019-11-09 DIAGNOSIS — I10 Essential (primary) hypertension: Secondary | ICD-10-CM

## 2019-11-09 NOTE — Telephone Encounter (Signed)
90 day courtesy refill Requested Prescriptions  Pending Prescriptions Disp Refills  . losartan-hydrochlorothiazide (HYZAAR) 100-25 MG tablet [Pharmacy Med Name: LOSARTAN-HCTZ 100-25 MG TAB] 90 tablet 0    Sig: TAKE 1 TABLET BY MOUTH EVERY DAY     Cardiovascular: ARB + Diuretic Combos Failed - 11/09/2019 10:12 AM      Failed - K in normal range and within 180 days    Potassium  Date Value Ref Range Status  04/11/2018 3.9 3.5 - 5.3 mmol/L Final         Failed - Na in normal range and within 180 days    Sodium  Date Value Ref Range Status  04/11/2018 139 135 - 146 mmol/L Final  01/12/2016 140 134 - 144 mmol/L Final         Failed - Cr in normal range and within 180 days    Creat  Date Value Ref Range Status  04/11/2018 0.63 0.60 - 0.93 mg/dL Final    Comment:    For patients >26 years of age, the reference limit for Creatinine is approximately 13% higher for people identified as African-American. .          Failed - Ca in normal range and within 180 days    Calcium  Date Value Ref Range Status  04/11/2018 9.3 8.6 - 10.4 mg/dL Final         Failed - Last BP in normal range    BP Readings from Last 1 Encounters:  09/16/19 (!) 150/62         Failed - Valid encounter within last 6 months    Recent Outpatient Visits          4 months ago 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[facility])   [facility], Drue Stager, MD   10 months ago Diabetes mellitus type 2 in obese [facility])   [facility] Steele Sizer, MD   1 year ago Dyslipidemia associated with type 2 diabetes mellitus [facility])   [facility] Steele Sizer, MD   1 year ago Dyslipidemia associated with type 2 diabetes mellitus [facility])   [facility] Steele Sizer, MD   1 year ago Skin infection   [place], NP      Future Appointments            In 1 month Ancil Boozer, Drue Stager, MD [facility], [place]   In 5  months  [facility], [place]   In 7 months [place], [place] Payment [place] - Patient is not pregnant

## 2019-11-09 NOTE — Chronic Care Management (AMB) (Signed)
  Chronic Care Management   Note  11/09/2019 Name: SAPHYRE CILLO MRN: [id number] DOB: [birth year]  JALON BLACKWELDER is a 79 y.o. year old female who is a primary care patient of Steele Sizer, MD. I reached out to [name] by phone today in response to a referral sent by Ms. Leona Carry Cantu's health plan.     Ms. Deschepper was given information about Chronic Care Management services today including:  1. CCM service includes personalized support from designated clinical staff supervised by her physician, including individualized plan of care and coordination with other care providers 2. 24/7 contact phone numbers for assistance for urgent and routine care needs. 3. Service will only be billed when office clinical staff spend 20 minutes or more in a month to coordinate care. 4. Only one practitioner may furnish and bill the service in a calendar month. 5. The patient may stop CCM services at any time (effective at the end of the month) by phone call to the office staff. 6. The patient will be responsible for cost sharing (co-pay) of up to 20% of the service fee (after annual deductible is met).  Patient agreed to services and verbal consent obtained.   Follow up plan: Telephone appointment with care management team member scheduled for:12/03/2019  Noreene Larsson, [place], [place], [place] [address] Direct Dial: [phone] [email or web address] Website: [email or web address]

## 2019-11-18 DIAGNOSIS — H903 Sensorineural hearing loss, bilateral: Secondary | ICD-10-CM | POA: Diagnosis not present

## 2019-11-19 DIAGNOSIS — M5416 Radiculopathy, lumbar region: Secondary | ICD-10-CM | POA: Diagnosis not present

## 2019-11-19 DIAGNOSIS — M5033 Other cervical disc degeneration, cervicothoracic region: Secondary | ICD-10-CM | POA: Diagnosis not present

## 2019-11-19 DIAGNOSIS — M9903 Segmental and somatic dysfunction of lumbar region: Secondary | ICD-10-CM | POA: Diagnosis not present

## 2019-11-19 DIAGNOSIS — M9901 Segmental and somatic dysfunction of cervical region: Secondary | ICD-10-CM | POA: Diagnosis not present

## 2019-11-24 NOTE — Progress Notes (Signed)
Patient ID: Bonnie Rowland, female    DOB: [birth year], 79 y.o.   MRN: [id number]  PCP: Steele Sizer, MD  Chief Complaint  Patient presents with  . Back Pain    Onset last week  . Hip Pain    back pain/cramping goes down into left hip    Subjective:   Bonnie Rowland is a 79 y.o. female, presents to clinic with CC of the following:  Chief Complaint  Patient presents with  . Back Pain    Onset last week  . Hip Pain    back pain/cramping goes down into left hip    HPI:  Patient is a 79 year old female patient of Dr. Ancil Boozer She is followed for multiple medical problems as seen in her active problem list. She has a DM and osteoporosis history, followed by endocrinology, and received a dose of Prolia in March 2021. He also had a total knee replacement approximately 5 years ago. She follows up today with complaints of left-sided low back pain that started last week.  She notes that in the recent past, in general life has been good.  Walks a mile a day, moved to [employer].  In the past week, she has had more back pain concern She had no trauma preceding the onset, and it is slowly continued to increase since last week. Pain felt in left lower back area, and some in the buttock and around to the lateral aspect of the hip, sometimes can feel some cramping in the groin region. Notes the pain is most problematic when trying to get up and down, and with movements.  She describes the pain as sometimes "seizing up" feeling when she tries movements.  She notes it is mostly in that lower back region.  No pain radiates down the legs No numbness, tingling or marked weakness in LE's, no foot drop No bowel or bladder dysfunction symptoms, no saddle anesthesia symptoms No fevers or other infectious symptoms of concern Has tried Tylenol arthritis strength, just started yesterday and takes about every 8 hours.  She avoids ibuprofen or Aleve type products as often causes swelling.   She has had massage to the back area which did help yesterday, has applied lots of ice.  She inquired also about potential acupuncture.  Has a h/o low back pain in the past, she noted often more right-sided tightens up, with this now on the left side. No h/o major prior back fracture/surgery No cancer history + diabetes history + osteoporosis history    Patient Active Problem List   Diagnosis Date Noted  . Plantar fasciitis, bilateral 05/21/2019  . Age-related osteoporosis without current pathological fracture 11/05/2017  . 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([place]) 07/09/2017  . Atherosclerosis of abdominal aorta ([place]) 07/09/2017  . Bilateral carotid bruits 08/24/2015  . 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 Atrial flutter ([place]) 02/22/2015  . Chronic pain 01/10/2015  . 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 Fatigue 01/10/2015  . History of melanoma excision 01/10/2015  . Morbid obesity ([place]) 01/10/2015  . 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 Spinal stenosis, lumbar region, 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  . DDD (degenerative disc disease), lumbar 11/14/2014  . Greater trochanteric bursitis of both hips 11/14/2014  . Piriformis syndrome 11/14/2014  . 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 Hyperlipemia 11/07/2009  . Hypertension, benign 11/07/2009  . Atherosclerosis of renal artery ([place]) 11/07/2009  . Hyperlipidemia  due to type 2 diabetes mellitus ([place]) 11/07/2009  . Perennial allergic rhinitis 11/27/2007  . Lumbosacral neuritis 01/17/2007      Current Outpatient Medications:  .  acetaminophen (TYLENOL) 500 MG tablet, Take 500 mg by mouth every 6 (six) hours as needed., Disp: , Rfl:  .  ALFALFA PO, Take 3 tablets by mouth every morning., Disp: , Rfl:  .  aspirin 81 MG EC tablet, Take 81 mg by mouth at bedtime. , Disp: , Rfl:  .  B Complex-C (B-COMPLEX WITH VITAMIN C) tablet, Take 2  tablets by mouth at bedtime. , Disp: , Rfl:  .  BD PEN NEEDLE NANO U/F 32G X 4 MM MISC, USE 4 TIMES DAILY (HUMALOG 3 TIMES DAILY AND LANTUS ONCE DAILY) OFFICE NOTIFIED 08/06/17, Disp: 90 each, Rfl: 1 .  BLACK ELDERBERRY,BERRY-FLOWER, PO, Take by mouth., Disp: , Rfl:  .  BYDUREON 2 MG PEN, INJECT 2 MG SUBCUTANEOUSLY ONCE A WEEK, Disp: , Rfl:  .  calcium carbonate (OS-CAL) 600 MG TABS tablet, Take 600 mg by mouth daily., Disp: , Rfl:  .  cholecalciferol (VITAMIN D) 1000 UNITS tablet, Take 4,000 Units by mouth daily. , Disp: , Rfl:  .  clobetasol ointment (TEMOVATE) [id number] %, APPLY 1 APPLICATION TOPICALLY 2 (TWO) TIMES DAILY AS NEEDED (BUG BITES)., Disp: 30 g, Rfl: 0 .  Coenzyme Q10 (COQ10) 200 MG CAPS, Take 1 tablet by mouth daily. 400 mg every am., Disp: , Rfl:  .  Continuous Blood Gluc Sensor ([place]) MISC, by Does not apply route., Disp: , Rfl:  .  Elastic Bandages & Supports ([place]) MISC, 1 each by Does not apply route daily., Disp: 2 each, Rfl: 5 .  estradiol (ESTRACE VAGINAL) 0.1 MG/GM vaginal cream, 1/2 gm once weekly using applicator, apply blueberry sized amount of cream using tip of finger to urethra twice weekly, Disp: 50 g, Rfl: 4 .  Eyelid Cleansers (AVENOVA) 0.01 % SOLN, See admin instructions., Disp: , Rfl: 3 .  fish oil-omega-3 fatty acids 1000 MG capsule, Take 2 g by mouth daily.  , Disp: , Rfl:  .  fluticasone (FLONASE) 50 MCG/ACT nasal spray, PLACE 2 SPRAYS INTO BOTH NOSTRILS DAILY., Disp: 8.5 g, Rfl: 2 .  GARLIC 99991111 PO, Take 1 tablet by mouth daily. 1 gram, Disp: , Rfl:  .  Glucosamine 500 MG TABS, Take 1 tablet by mouth daily. , Disp: , Rfl:  .  HUMALOG KWIKPEN 100 UNIT/ML KiwkPen, Inject 6 Units into the skin 3 (three) times daily after meals. Pt taking before meals per Dr. Honor Junes, Disp: , Rfl: 3 .  Insulin Glargine (LANTUS SOLOSTAR) 100 UNIT/ML Solostar Pen, Inject 24 Units into the skin every morning. (Patient taking differently:  Inject 22 Units into the skin every morning. ), Disp: 15 pen, Rfl: 2 .  losartan-hydrochlorothiazide (HYZAAR) 100-25 MG tablet, TAKE 1 TABLET BY MOUTH EVERY DAY, Disp: 90 tablet, Rfl: 0 .  Mag Aspart-Potassium Aspart (POTASSIUM & MAGNESIUM ASPARTAT PO), Take 1 tablet by mouth daily., Disp: , Rfl:  .  metoprolol succinate (TOPROL-XL) 25 MG 24 hr tablet, Take 1 tablet (25 mg total) by mouth daily., Disp: 90 tablet, Rfl: 3 .  Multiple Vitamins-Minerals (PRESERVISION AREDS 2) CAPS, Take 1 capsule by mouth 2 (two) times daily., Disp: , Rfl:  .  nystatin cream (MYCOSTATIN), Apply 1 application topically 2 (two) times daily., Disp: 60 g, Rfl: 2 .  Probiotic Product (PROBIOTIC PO), Take 1 Dose by mouth at bedtime., Disp: ,  Rfl:  .  PROLIA 60 MG/ML SOSY injection, , Disp: , Rfl:  .  Propylene Glycol (SYSTANE BALANCE OP), Apply 1 drop to eye as needed., Disp: , Rfl:  .  rosuvastatin (CRESTOR) 10 MG tablet, Take 1 tablet (10 mg total) by mouth daily., Disp: 90 tablet, Rfl: 1 .  sodium fluoride (DENTAGEL) 1.1 % GEL dental gel, , Disp: , Rfl:  .  sulfacetamide (BLEPH-10) 10 % ophthalmic solution, Place 1 drop into both eyes 3 (three) times daily as needed., Disp: , Rfl:  .  Vitamin D, Ergocalciferol, (DRISDOL) 1.25 MG (50000 UT) CAPS capsule, Take 1 capsule by mouth once a week., Disp: , Rfl:  .  vitamin E (VITAMIN E) 400 UNIT capsule, Take 400 Units by mouth daily. Every am, Disp: , Rfl:  .  fluconazole (DIFLUCAN) 150 MG tablet, Take 1 tablet (150 mg total) by mouth every other day. (Patient not taking: Reported on 11/25/2019), Disp: 3 tablet, Rfl: 0   Allergies  Allergen Reactions  . Cyclobenzaprine Hypertension  . Cheese Other (See Comments)    bloating  . Ciprofloxacin Hcl Other (See Comments)    Muscle pain  . Coconut Oil   . Keflex [Cephalexin] Other (See Comments)    Patient prefers not to take this medication due to the side effects     Past Surgical History:  Procedure Laterality Date  .  APPENDECTOMY    . CARDIAC CATHETERIZATION    . cataract surgery    . COLONOSCOPY    . COLONOSCOPY WITH PROPOFOL N/A 05/09/2016   Procedure: COLONOSCOPY WITH PROPOFOL;  Surgeon: Robert Bellow, MD;  Location: [facility] ENDOSCOPY;  Service: Endoscopy;  Laterality: N/A;  . DIAGNOSTIC MAMMOGRAM    . EYE SURGERY Bilateral    Cataract Extraction  . KNEE ARTHROPLASTY Right 03/30/2015   Procedure: COMPUTER ASSISTED TOTAL KNEE ARTHROPLASTY;  Surgeon: Dereck Leep, MD;  Location: [facility] ORS;  Service: Orthopedics;  Laterality: Right;  . KNEE ARTHROSCOPY Right   . KNEE CLOSED REDUCTION Right 05/23/2015   Procedure: CLOSED MANIPULATION KNEE;  Surgeon: Dereck Leep, MD;  Location: [facility] ORS;  Service: Orthopedics;  Laterality: Right;  . TONSILLECTOMY       Family History  Problem Relation Age of Onset  . Diabetes Mother   . Hypertension Mother   . Cancer Mother   . Hypertension Father   . Cancer Father        Prostate  . Breast cancer Maternal Aunt 4  . Colon cancer Son   . Kidney cancer Neg Hx   . Bladder Cancer Neg Hx      Social History   Tobacco Use  . Smoking status: Never Smoker  . Smokeless tobacco: Never Used  Substance Use Topics  . Alcohol use: Not Currently    Alcohol/week: 0.0 standard drinks    With staff assistance, above reviewed with the patient today.  ROS: As per HPI, otherwise no specific complaints on a limited and focused system review   No results found for this or any previous visit (from the past 72 hour(s)).   PHQ2/9: Depression screen [facility] 2/9 11/25/2019 07/10/2019 04/17/2019 01/08/2019 07/10/2018  Decreased Interest 0 0 0 0 0  Down, Depressed, Hopeless 0 0 0 0 0  PHQ - 2 Score 0 0 0 0 0  Altered sleeping 0 0 - 0 -  Tired, decreased energy 0 0 - 0 -  Change in appetite 0 0 - 0 -  Feeling bad or failure about yourself  0 0 - 0 -  Trouble concentrating 0 0 - 0 -  Moving slowly or fidgety/restless 0 0 - 0 -  Suicidal thoughts 0 0 - 0 -  PHQ-9 Score 0 0 -  0 -  Difficult doing work/chores Not difficult at all - - - -   PHQ-2/9 Result is neg  Fall Risk: Fall Risk  11/25/2019 07/10/2019 04/17/2019 01/08/2019 07/10/2018  Falls in the past year? 0 0 0 0 0  Comment - - - - -  Number falls in past yr: 0 0 0 0 0  Injury with Fall? 0 0 0 0 0  Risk for fall due to : - - - - -  Risk for fall due to: Comment - - - - -  Follow up - - Falls prevention discussed - -      Objective:   Vitals:   11/25/19 1012  Pulse: 74  Resp: 16  Temp: 97.9 F (36.6 C)  TempSrc: Temporal  SpO2: 98%  Weight: 207 lb 9.6 oz (94.2 kg)  Height: 5\' 3"  (1.6 m)    Body mass index is 36.77 kg/m.  Physical Exam   NAD, masked, obese HEENT - [place]/AT, sclera anicteric, Neck - supple,  Car - RRR, not tachycardic Abd -obese, soft, NT to palpation, most notably in the left upper and lower quadrant region,  no masses Nontender palpating the left suprapubic region where she sometimes notes she feels discomfort radiating with the back pain. Back - no CVA tenderness,   Limited ROM, and very hesitant with range of motion attempts due to potential discomfort.  Tender with palpation in lower back, lateral to left lumbar spine  and more in and surrounding the SI joint region NT over spine proper, no marked spasm  No focal CVA tenderness No bruising or swelling             SLR negative, without significant low back pain when doing the straight leg raise bilateral Good strength in LE's on testing including with dorsi and plantar flexion of feet bilateral             Sensation intact to LT in bilteral LE's distal             DTR's 2+ and = in patella and achilles Tender with palpation over the trochanteric bursa region on the left Nontender palpating the hip joint anteriorly, with good range of motion of the hip joint with internal and external rotation and abduction abduction and no pain when testing hip motion today. Neuro/psychiatric - affect was not flat, appropriate with  conversation  Alert   She uses 2 canes to help support herself with ambulation, and also getting from the chair to the exam table, and was able to get up onto the exam table with assistance  Speech  normal   Results for orders placed or performed in visit on 06/11/19  Bladder Scan (Post Void Residual) in office  Result Value Ref Range   Scan Result 0        Assessment & Plan:   1. Acute left-sided low back pain without sciatica Educated, noted she does have some red flag concerns in her history that would prompt more acute need of an x-ray to help evaluate.  This does seem more mechanical, with her noting a muscle relaxer was requested to try to help.  Do feel she may have some SI joint dysfunction, and also sacroiliitis as a possible source of her symptoms. Encouraged she  has no radicular signs or symptoms, and that her hip joint proper seemed to be okay on exam today. Agreed to hold off on the x-ray today, although emphasized having a low yield to pursue if this is not getting better fairly quickly with the below measures.  Relative rest from exertional activities until symptoms improving, including walking Contrast therapy reviewed with warm compresses, followed by gentle ROM (not ballistic and exercises reviewed), followed by ice after to help lessen inflammation,  Will not add NSAIDs, given her history Zanaflex prescribed as the muscle relaxer, as she had cyclobenzaprine on her allergy list, noting hypertension the cause and felt best to avoid.  She did note she was on a muscle relaxer in her more distant past that she tolerated. Continue the arthritis strength Tylenol as needed to help with pain Can continue the massage as well, and I did note acupuncture is an option some people utilize, although the sound scientific data supporting this for acute back pain is limited.  - tiZANidine (ZANAFLEX) 4 MG capsule; Take 1 capsule (4 mg total) by mouth 3 (three) times daily as needed for muscle  spasms. May make drowsy, not to drive while taking.  Dispense: 30 capsule; Refill: 1  2. Trochanteric bursitis of left hip Educated on this, and she has a history of this in her past. Emphasized only applying ice to this area and not heat and the extra strength Tylenol can help as well. Do feel most of her discomfort is more low back related, and not feel entertaining an injection to this area is appropriate presently.  3. Osteoporosis, post-menopausal 4. Diabetes mellitus type 2 in obese [facility])  Noted these 2 entities in her history, and often proceeding more quickly to x-rays done as a result.  Encouraged she has no radicular signs or symptoms, and her hip motion on exam today was good. Emphasized following up if not improving or more problematic through the weekend into next week, as have a low yield to then pursue x-rays and further work-up pending her status and reassessment at that time.  She was understanding of that today.     Towanda Malkin, MD 11/25/19 10:17 AM

## 2019-11-25 ENCOUNTER — Telehealth: Payer: Self-pay | Admitting: Family Medicine

## 2019-11-25 ENCOUNTER — Other Ambulatory Visit: Payer: Self-pay

## 2019-11-25 ENCOUNTER — Ambulatory Visit (INDEPENDENT_AMBULATORY_CARE_PROVIDER_SITE_OTHER): Payer: Medicare Other | Admitting: Internal Medicine

## 2019-11-25 ENCOUNTER — Encounter: Payer: Self-pay | Admitting: Internal Medicine

## 2019-11-25 VITALS — BP 128/70 | HR 74 | Temp 97.9°F | Resp 16 | Ht 63.0 in | Wt 207.6 lb

## 2019-11-25 DIAGNOSIS — M810 Age-related osteoporosis without current pathological fracture: Secondary | ICD-10-CM

## 2019-11-25 DIAGNOSIS — E669 Obesity, unspecified: Secondary | ICD-10-CM

## 2019-11-25 DIAGNOSIS — M545 Low back pain, unspecified: Secondary | ICD-10-CM | POA: Insufficient documentation

## 2019-11-25 DIAGNOSIS — E1169 Type 2 diabetes mellitus with other specified complication: Secondary | ICD-10-CM

## 2019-11-25 DIAGNOSIS — M7062 Trochanteric bursitis, left hip: Secondary | ICD-10-CM | POA: Diagnosis not present

## 2019-11-25 MED ORDER — TIZANIDINE HCL 4 MG PO CAPS: 4.0000 mg | ORAL_CAPSULE | Freq: Three times a day (TID) | ORAL | 1 refills | Status: DC | PRN

## 2019-11-25 NOTE — Chronic Care Management (AMB) (Signed)
  Chronic Care Management   Note  11/25/2019 Name: Bonnie Rowland MRN: [id number] DOB: 04/04/1941  Bonnie Rowland is a 79 y.o. year old female who is a primary care patient of Steele Sizer, MD and is actively engaged with the care management team. I reached out to [name] by phone today to assist with re-scheduling an initial visit with the Pharmacist  Follow up plan: Unsuccessful telephone outreach attempt made. A HIPPA compliant phone message was left for the patient providing contact information and requesting a return call. The care management team will reach out to the patient again over the next 7 days.  If patient returns call to provider office, please advise to call [place] at [place], [place] [facility]  [place], [place] [address] Direct Dial: [email or web address] Website: [email or web address]

## 2019-11-25 NOTE — Patient Instructions (Signed)
Acute Back Pain, Adult Acute back pain is sudden and usually short-lived. It is often caused by an injury to the muscles and tissues in the back. The injury may result from:  A muscle or ligament getting overstretched or torn (strained). Ligaments are tissues that connect bones to each other. Lifting something improperly can cause a back strain.  Wear and tear (degeneration) of the spinal disks. Spinal disks are circular tissue that provides cushioning between the bones of the spine (vertebrae).  Twisting motions, such as while playing sports or doing yard work.  A hit to the back.  Arthritis. You may have a physical exam, lab tests, and imaging tests to find the cause of your pain. Acute back pain usually goes away with rest and home care. Follow these instructions at home: Managing pain, stiffness, and swelling  Take over-the-counter and prescription medicines only as told by your health care provider.  Your health care provider may recommend applying ice during the first 24-48 hours after your pain starts. To do this: ? Put ice in a plastic bag. ? Place a towel between your skin and the bag. ? Leave the ice on for 20 minutes, 2-3 times a day.  If directed, apply heat to the affected area as often as told by your health care provider. Use the heat source that your health care provider recommends, such as a moist heat pack or a heating pad. ? Place a towel between your skin and the heat source. ? Leave the heat on for 20-30 minutes. ? Remove the heat if your skin turns bright red. This is especially important if you are unable to feel pain, heat, or cold. You have a greater risk of getting burned. Activity   Do not stay in bed. Staying in bed for more than 1-2 days can delay your recovery.  Sit up and stand up straight. Avoid leaning forward when you sit, or hunching over when you stand. ? If you work at a desk, sit close to it so you do not need to lean over. Keep your chin tucked  in. Keep your neck drawn back, and keep your elbows bent at a right angle. Your arms should look like the letter "L." ? Sit high and close to the steering wheel when you drive. Add lower back (lumbar) support to your car seat, if needed.  Take short walks on even surfaces as soon as you are able. Try to increase the length of time you walk each day.  Do not sit, drive, or stand in one place for more than 30 minutes at a time. Sitting or standing for long periods of time can put stress on your back.  Do not drive or use heavy machinery while taking prescription pain medicine.  Use proper lifting techniques. When you bend and lift, use positions that put less stress on your back: ? Bend your knees. ? Keep the load close to your body. ? Avoid twisting.  Exercise regularly as told by your health care provider. Exercising helps your back heal faster and helps prevent back injuries by keeping muscles strong and flexible.  Work with a physical therapist to make a safe exercise program, as recommended by your health care provider. Do any exercises as told by your physical therapist. Lifestyle  Maintain a healthy weight. Extra weight puts stress on your back and makes it difficult to have good posture.  Avoid activities or situations that make you feel anxious or stressed. Stress and anxiety increase muscle   tension and can make back pain worse. Learn ways to manage anxiety and stress, such as through exercise. General instructions  Sleep on a firm mattress in a comfortable position. Try lying on your side with your knees slightly bent. If you lie on your back, put a pillow under your knees.  Follow your treatment plan as told by your health care provider. This may include: ? Cognitive or behavioral therapy. ? Acupuncture or massage therapy. ? Meditation or yoga. Contact a health care provider if:  You have pain that is not relieved with rest or medicine.  You have increasing pain going down  into your legs or buttocks.  Your pain does not improve after 2 weeks.  You have pain at night.  You lose weight without trying.  You have a fever or chills. Get help right away if:  You develop new bowel or bladder control problems.  You have unusual weakness or numbness in your arms or legs.  You develop nausea or vomiting.  You develop abdominal pain.  You feel faint. Summary  Acute back pain is sudden and usually short-lived.  Use proper lifting techniques. When you bend and lift, use positions that put less stress on your back.  Take over-the-counter and prescription medicines and apply heat or ice as directed by your health care provider. This information is not intended to replace advice given to you by your health care provider. Make sure you discuss any questions you have with your health care provider. Document Revised: 10/07/2018 Document Reviewed: 01/30/2017 Elsevier Patient Education  2020 [employer]  

## 2019-11-26 NOTE — Chronic Care Management (AMB) (Signed)
  Chronic Care Management   Note  11/26/2019 Name: Bonnie Rowland MRN: [id number] DOB: [birth year]  Bonnie Rowland is a 79 y.o. year old female who is a primary care patient of Steele Sizer, MD and is actively engaged with the care management team. I reached out to Bonnie Rowland by phone today to assist with re-scheduling an initial visit with the Pharmacist.  Follow up plan: Telephone appointment with care management team member scheduled for: 12/29/2019  [place]  [place], [place] [address] Direct Dial: [email or web address] Website: [email or web address]

## 2019-11-27 DIAGNOSIS — M9903 Segmental and somatic dysfunction of lumbar region: Secondary | ICD-10-CM | POA: Diagnosis not present

## 2019-11-27 DIAGNOSIS — M5033 Other cervical disc degeneration, cervicothoracic region: Secondary | ICD-10-CM | POA: Diagnosis not present

## 2019-11-27 DIAGNOSIS — M9901 Segmental and somatic dysfunction of cervical region: Secondary | ICD-10-CM | POA: Diagnosis not present

## 2019-11-27 DIAGNOSIS — M5416 Radiculopathy, lumbar region: Secondary | ICD-10-CM | POA: Diagnosis not present

## 2019-12-03 ENCOUNTER — Telehealth: Payer: Medicare Other

## 2019-12-03 DIAGNOSIS — M9903 Segmental and somatic dysfunction of lumbar region: Secondary | ICD-10-CM | POA: Diagnosis not present

## 2019-12-03 DIAGNOSIS — M5416 Radiculopathy, lumbar region: Secondary | ICD-10-CM | POA: Diagnosis not present

## 2019-12-03 DIAGNOSIS — M9901 Segmental and somatic dysfunction of cervical region: Secondary | ICD-10-CM | POA: Diagnosis not present

## 2019-12-03 DIAGNOSIS — M5033 Other cervical disc degeneration, cervicothoracic region: Secondary | ICD-10-CM | POA: Diagnosis not present

## 2019-12-10 DIAGNOSIS — M9903 Segmental and somatic dysfunction of lumbar region: Secondary | ICD-10-CM | POA: Diagnosis not present

## 2019-12-10 DIAGNOSIS — M5033 Other cervical disc degeneration, cervicothoracic region: Secondary | ICD-10-CM | POA: Diagnosis not present

## 2019-12-10 DIAGNOSIS — M9901 Segmental and somatic dysfunction of cervical region: Secondary | ICD-10-CM | POA: Diagnosis not present

## 2019-12-10 DIAGNOSIS — M5416 Radiculopathy, lumbar region: Secondary | ICD-10-CM | POA: Diagnosis not present

## 2019-12-18 ENCOUNTER — Other Ambulatory Visit: Payer: Self-pay | Admitting: Family Medicine

## 2019-12-18 DIAGNOSIS — I10 Essential (primary) hypertension: Secondary | ICD-10-CM

## 2019-12-18 DIAGNOSIS — W57XXXD Bitten or stung by nonvenomous insect and other nonvenomous arthropods, subsequent encounter: Secondary | ICD-10-CM

## 2019-12-29 ENCOUNTER — Ambulatory Visit: Payer: Medicare Other | Admitting: Pharmacist

## 2019-12-29 ENCOUNTER — Other Ambulatory Visit: Payer: Self-pay

## 2019-12-29 DIAGNOSIS — E1169 Type 2 diabetes mellitus with other specified complication: Secondary | ICD-10-CM

## 2019-12-29 DIAGNOSIS — M48062 Spinal stenosis, lumbar region with neurogenic claudication: Secondary | ICD-10-CM

## 2019-12-29 DIAGNOSIS — E785 Hyperlipidemia, unspecified: Secondary | ICD-10-CM

## 2019-12-29 NOTE — Chronic Care Management (AMB) (Signed)
Chronic Care Management Pharmacy  Name: Bonnie Rowland  MRN: [id number] DOB: [birth year]  Chief Complaint/ HPI  Bonnie Rowland,  79 y.o. , female presents for their Initial CCM visit with the clinical pharmacist via telephone due to COVID-19 Pandemic.  PCP : Steele Sizer, MD  Their chronic conditions include: HTN, HLD, DM  Office Visits: 5/26 back pain, [place], BP 128/70 P 74 Wt 208 BMI 36.8, NSAID cause swelling, Rx Zanaflex 41m tid prn (Flexeril allergy)  Consult Visit: 3/17 atrial flutter, [place], BP 150/62 P 67 Wt 202.5, A1c 6.5% LDL 57, off flecainide, on metoprolol  2/25 DM, O'Connell, BP 142/60 P 61 Wt 202 BMI 34.7, Bydureon 253mqwk, Lantus 22u qhs, Humalog 6-10u tid ac, ABG 125, lost 3#  Medications: Outpatient Encounter Medications as of 12/29/2019  Medication Sig Note  . acetaminophen (TYLENOL) 500 MG tablet Take 500 mg by mouth every 6 (six) hours as needed. Takes 130090mt bedtime with Zanaflex   . ALFALFA PO Take 3 tablets by mouth every morning.   . aMarland Kitchenpirin 81 MG EC tablet Take 81 mg by mouth at bedtime.  02/22/2015: Holding due to surgery.  . B Complex-C (B-COMPLEX WITH VITAMIN C) tablet Take 1 tablet by mouth at bedtime.    . BD PEN NEEDLE NANO U/F 32G X 4 MM MISC USE 4 TIMES DAILY (HUMALOG 3 TIMES DAILY AND LANTUS ONCE DAILY) OFFICE NOTIFIED 08/06/17   . BLACK ELDERBERRY,BERRY-FLOWER, PO Take by mouth.   . BMarland KitchenDUREON 2 MG PEN INJECT 2 MG SUBCUTANEOUSLY ONCE A WEEK   . calcium carbonate (OS-CAL) 600 MG TABS tablet Take 1,000 mg by mouth daily.    . cholecalciferol (VITAMIN D) 1000 UNITS tablet Take 1,000 Units by mouth daily.    . clobetasol ointment (TEMOVATE) 0.04.08APPLY 1 APPLICATION TOPICALLY 2 (TWO) TIMES DAILY AS NEEDED (BUG BITES).   . Coenzyme Q10 (COQ10) 200 MG CAPS Take 1 tablet by mouth daily. 100 mg every am.   . Continuous Blood Gluc Sensor (FREAdairvilleISC by Does not apply route.   . ERegino Schultzendages & Supports (MEDICAL  COMPRESSION STOCKINGS) MISC 1 each by Does not apply route daily.   . eMarland Kitchentradiol (ESTRACE VAGINAL) 0.1 MG/GM vaginal cream 1/2 gm once weekly using applicator, apply blueberry sized amount of cream using tip of finger to urethra twice weekly (Patient taking differently: Place vaginally 2 (two) times a week. 1/2 gm once weekly using applicator, apply blueberry sized amount of cream using tip of finger to urethra twice weekly)   . Eyelid Cleansers (AVENOVA) 0.01 % SOLN See admin instructions. 01/16/2016: Received from: External Pharmacy  . fish oil-omega-3 fatty acids 1000 MG capsule Take 500 mg by mouth daily. Krill Oil   . fluconazole (DIFLUCAN) 150 MG tablet Take 1 tablet (150 mg total) by mouth every other day.   . GMarland KitchenRLIC 1501448 Take 1 tablet by mouth daily. 1 gram   . Glucosamine 500 MG TABS Take 1 tablet by mouth daily.    . HMarland KitchenMALOG KWIKPEN 100 UNIT/ML KiwkPen Inject 6 Units into the skin 3 (three) times daily after meals. Pt taking before meals per Dr. O'CHonor Junesometimes just twice Maximum 11 units per patient   . Insulin Glargine (LANTUS SOLOSTAR) 100 UNIT/ML Solostar Pen Inject 24 Units into the skin every morning. (Patient taking differently: Inject 22 Units into the skin every morning. )   . losartan-hydrochlorothiazide (HYZAAR) 100-25 MG tablet TAKE 1 TABLET BY MOUTH EVERY DAY (Patient taking  differently: in the morning. )   . Mag Aspart-Potassium Aspart (POTASSIUM & MAGNESIUM ASPARTAT PO) Take 1 tablet by mouth daily.   . metoprolol succinate (TOPROL-XL) 25 MG 24 hr tablet Take 1 tablet (25 mg total) by mouth daily. (Patient taking differently: Take 12.5 mg by mouth 2 (two) times daily. )   . Multiple Vitamins-Minerals (PRESERVISION AREDS 2) CAPS Take 1 capsule by mouth 2 (two) times daily.   Marland Kitchen nystatin cream (MYCOSTATIN) Apply 1 application topically 2 (two) times daily.   . Probiotic Product (PROBIOTIC PO) Take 1 Dose by mouth at bedtime.   Marland Kitchen PROLIA 60 MG/ML SOSY injection    .  Propylene Glycol (SYSTANE BALANCE OP) Apply 1 drop to eye as needed.   . rosuvastatin (CRESTOR) 10 MG tablet Take 1 tablet (10 mg total) by mouth daily. (Patient taking differently: Take 10 mg by mouth 3 (three) times a week. )   . sodium fluoride (DENTAGEL) 1.1 % GEL dental gel  12/12/2015: Received from: [facility]  . sulfacetamide (BLEPH-10) 10 % ophthalmic solution Place 1 drop into both eyes 3 (three) times daily as needed.   Marland Kitchen tiZANidine (ZANAFLEX) 4 MG capsule Take 1 capsule (4 mg total) by mouth 3 (three) times daily as needed for muscle spasms. May make drowsy, not to drive while taking. (Patient taking differently: Take 4 mg by mouth at bedtime as needed for muscle spasms. May make drowsy, not to drive while taking.)   . Vitamin D, Ergocalciferol, (DRISDOL) 1.25 MG (50000 UT) CAPS capsule Take 1 capsule by mouth once a week.   . vitamin E (VITAMIN E) 400 UNIT capsule Take 400 Units by mouth daily. Every am   . fluticasone (FLONASE) 50 MCG/ACT nasal spray PLACE 2 SPRAYS INTO BOTH NOSTRILS DAILY. (Patient not taking: Reported on 12/29/2019)    No facility-administered encounter medications on file as of 12/29/2019.     Financial Resource Strain: Low Risk   . Difficulty of Paying Living Expenses: Not very hard   Current Diagnosis/Assessment:  Goals Addressed            This Visit's Progress   . Chronic Care Management       CARE PLAN ENTRY (see longitudinal plan of care for additional care plan information)  Current Barriers:  . Chronic Disease Management support, education, and care coordination needs related to Hyperlipidemia, Diabetes, and Osteoporosis .   Hyperlipidemia Lab Results  Component Value Date/Time   LDLCALC 57 11/20/2018 12:00 AM   LDLCALC 82 04/11/2018 10:32 AM   . Pharmacist Clinical Goal(s): o Over the next 90 days, patient will work with PharmD and providers to maintain LDL goal < 70 . Current regimen:  o Crestor 86m  daily . Interventions: o None . Patient self care activities - Over the next 90 days, patient will: o Continue rosuvastatin 166mdaily  Diabetes Lab Results  Component Value Date/Time   HGBA1C 6.5 11/20/2018 12:00 AM   HGBA1C 7.2 03/13/2018 12:00 AM   . Pharmacist Clinical Goal(s): o Over the next 90 days, patient will work with PharmD and providers to maintain A1c goal <7% . Current regimen:  o Lantus inject 22 units daily o Humalog inject 6 - 10 units three times daily with meals o Bydureon 1m71mnject weekly . Interventions: o None . Patient self care activities - Over the next 90 days, patient will: o Check blood sugar once daily, document, and provide at future appointments o No need to check blood sugar  using both continuous sensor and traditional meter o Contact provider with any episodes of hypoglycemia  Chronic Pain . Pharmacist Clinical Goal(s) o Over the next 90 days, patient will work with PharmD and providers to decreasing pain level . Current regimen:  o Acetaminophen 563m as needed o Glucosamine 503monce daily o Zanaflex 54m76mt bedtime . Interventions: o Refill Zanaflex for reduced usage of 54mg39m bedtime . Patient self care activities - Over the next 90 days, patient will: o Continue regular walk  Medication management . Pharmacist Clinical Goal(s): o Over the next 90 days, patient will work with PharmD and providers to maintain optimal medication adherence . Current [facility] . Interventions o Comprehensive medication review performed. o Continue current medication management strategy o Increase calcium to twice daily o Refill for Bleph-10 . Patient self care activities - Over the next 90 days, patient will: o Focus on medication adherence by maintaining current organizational practices o Take medications as prescribed o Report any questions or concerns to PharmD and/or provider(s)  Initial goal documentation       Diabetes   Recent  Relevant Labs: Lab Results  Component Value Date/Time   HGBA1C 6.5 11/20/2018 12:00 AM   HGBA1C 7.2 03/13/2018 12:00 AM   MICROALBUR 0 02/18/2017 09:01 AM   MICROALBUR 50 04/23/2016 10:37 AM     Checking BG: Daily  Recent FBG Readings: 121 Patient has failed these meds in past: NA Patient is currently controlled on the following medications: Bydureon 2mg 26m, Lantus 22u daily, Humalog 6 - 10u tid ac  Last diabetic Foot exam:  Lab Results  Component Value Date/Time   HMDIABEYEEXA No Retinopathy 01/08/2019 12:00 AM    Last diabetic Eye exam: No results found for: HMDIABFOOTEX   We discussed:  Using both BG meter and sensor At goal A1c Denies hypoglycemia  Plan  Stop using meter with sensor Continue current medications   Hypertension    Office blood pressures are  BP Readings from Last 3 Encounters:  11/25/19 128/70  09/16/19 (!) 150/62  06/11/19 (!) 162/62    Patient has failed these meds in the past: NA  Patient checks BP at home infrequently, will start  Patient home BP readings are ranging: NA  We discussed  Doesn't remember Flexeril causing HTN  Plan  Continue control with diet and exercise   Chronic Pain   Patient has failed these meds in past: NA Patient is currently controlled on the following medications: APAP, glucosamine, Zanaflex  We discussed:   Zanaflex for lower back and left hip, effective Zanaflex one at night, built tolerance Still walks a mile  Plan Needs more Zanaflex 54mg a11medtime (HendrRoxan Hockeytinue current medications  Osteopenia / Osteoporosis   Last DEXA Scan: 2/19   T-Score femoral neck: -3.0  T-Score forearm radius: -2.2  Vit D, 25-Hydroxy  Date Value Ref Range Status  08/22/2017 28 (L) 30 - 100 ng/mL Final    Comment:    Vitamin D Status         25-OH Vitamin D: . Deficiency:                    <20 ng/mL Insufficiency:             20 - 29 ng/mL Optimal:                 > or = 30 ng/mL . For 25-OH  Vitamin D testing on patients on  D2-supplementation and patients  for whom quantitation  of D2 and D3 fractions is required, the QuestAssureD(TM) 25-OH VIT D, (D2,D3), LC/MS/MS is recommended: order  code [phone] (patients >74yr). . For more information on this test, go to: http://education.questdiagnostics.com/faq/FAQ163 (This link is being provided for  informational/educational purposes only.)      Patient is a candidate for pharmacologic treatment due to T-Score < -2.5 in femoral neck  Patient has failed these meds in past: Evista, Reclast Patient is currently controlled on the following medications:  . Prolia  We discussed:  Recommend 1200 mg of calcium daily from dietary and supplemental sources. Supplement includes calcium citrate and calcium carbonate   Plan  Patient to increase calcium 10061mtwice daily  Hyperlipidemia   LDL goal < 70  Lipid Panel     Component Value Date/Time   CHOL 154 11/20/2018 0000   CHOL 110 01/12/2016 0817   TRIG 129 11/20/2018 0000   HDL 71 (A) 11/20/2018 0000   HDL 59 01/12/2016 0817   LDLCALC 57 11/20/2018 0000   LDLCALC 82 04/11/2018 1032    Hepatic Function Latest Ref Rng & Units 04/11/2018 08/22/2017 01/12/2016  Total Protein 6.1 - 8.1 g/dL 6.2 6.5 6.4  Albumin 3.5 - 4.8 g/dL - - 3.7  AST 10 - 35 U/L _0 ALT 6 - 29 U/L _1 Alk Phosphatase 39 - 117 IU/L - - 102  Total Bilirubin 0.2 - 1.2 mg/dL 0.7 0.7 0.5     The 10-year ASCVD risk score (GMikey BussingC Jr., et al., 2013) is: 44.4%*   Values used to calculate the score:     Age: [age]     Sex: Female     Is Non-Hispanic African American: No     Diabetic: Yes     Tobacco smoker: No     Systolic Blood Pressure: 12177mHg     Is BP treated: Yes     HDL Cholesterol: 71 mg/dL*     Total Cholesterol: 154 mg/dL*     * - Cholesterol units were assumed for this score calculation   Patient has failed these meds in past: NA Patient is currently controlled on the following  medications:  . Crestor 1089maily  We discussed:   At goal LDL Denies myalgias  Plan  Continue current medications   Medication Management   [facility] Uses pill box? Yes Pt endorses 100% compliance [facility] not preferred  We discussed:  Diflucan on hand Needs new Rx for Bleph-10 blephadex eyelid foam cleanser Compression stockings worn every day  Plan  Refill for Bleph-10 Continue current medication management strategy  Follow up: 3 month phone visit  TedMilus HeightharmD, BCGRomilda GarretTTLake Santeetlah Medical Center6319 [phone]

## 2019-12-29 NOTE — Patient Instructions (Addendum)
Visit Information  Goals Addressed            This Visit's Progress   . Chronic Care Management       CARE PLAN ENTRY (see longitudinal plan of care for additional care plan information)  Current Barriers:  . Chronic Disease Management support, education, and care coordination needs related to Hyperlipidemia, Diabetes, and Osteoporosis .   Hyperlipidemia Lab Results  Component Value Date/Time   LDLCALC 57 11/20/2018 12:00 AM   LDLCALC 82 04/11/2018 10:32 AM   . Pharmacist Clinical Goal(s): o Over the next 90 days, patient will work with PharmD and providers to maintain LDL goal < 70 . Current regimen:  o Crestor 10mg  daily . Interventions: o None . Patient self care activities - Over the next 90 days, patient will: o Continue rosuvastatin 10mg  daily  Diabetes Lab Results  Component Value Date/Time   HGBA1C 6.5 11/20/2018 12:00 AM   HGBA1C 7.2 03/13/2018 12:00 AM   . Pharmacist Clinical Goal(s): o Over the next 90 days, patient will work with PharmD and providers to maintain A1c goal <7% . Current regimen:  o Lantus inject 22 units daily o Humalog inject 6 - 10 units three times daily with meals o Bydureon 2mg  inject weekly . Interventions: o None . Patient self care activities - Over the next 90 days, patient will: o Check blood sugar once daily, document, and provide at future appointments o No need to check blood sugar using both continuous sensor and traditional meter o Contact provider with any episodes of hypoglycemia  Chronic Pain . Pharmacist Clinical Goal(s) o Over the next 90 days, patient will work with PharmD and providers to decreasing pain level . Current regimen:  o Acetaminophen 500mg  as needed o Glucosamine 500mg  once daily o Zanaflex 4mg  at bedtime . Interventions: o Refill Zanaflex for reduced usage of 4mg  at bedtime . Patient self care activities - Over the next 90 days, patient will: o Continue regular walk  Medication  management . Pharmacist Clinical Goal(s): o Over the next 90 days, patient will work with PharmD and providers to maintain optimal medication adherence . Current pharmacy: [facility] . Interventions o Comprehensive medication review performed. o Continue current medication management strategy o Increase calcium to twice daily o Refill for Bleph-10 . Patient self care activities - Over the next 90 days, patient will: o Focus on medication adherence by maintaining current organizational practices o Take medications as prescribed o Report any questions or concerns to PharmD and/or provider(s)  Initial goal documentation        Bonnie Rowland was given information about Chronic Care Management services today including:  1. CCM service includes personalized support from designated clinical staff supervised by her physician, including individualized plan of care and coordination with other care providers 2. 24/7 contact phone numbers for assistance for urgent and routine care needs. 3. Standard insurance, coinsurance, copays and deductibles apply for chronic care management only during months in which we provide at least 20 minutes of these services. Most insurances cover these services at 100%, however patients may be responsible for any copay, coinsurance and/or deductible if applicable. This service may help you avoid the need for more expensive face-to-face services. 4. Only one practitioner may furnish and bill the service in a calendar month. 5. The patient may stop CCM services at any time (effective at the end of the month) by phone call to the office staff.  Patient agreed to services and verbal consent obtained.  Print copy of patient instructions provided.  Telephone follow up appointment with pharmacy team member scheduled for: 3 months  Milus Height, PharmD, [place], [facility] [phone]  Acute Back Pain, Adult Acute back pain is sudden and  usually short-lived. It is often caused by an injury to the muscles and tissues in the back. The injury may result from:  A muscle or ligament getting overstretched or torn (strained). Ligaments are tissues that connect bones to each other. Lifting something improperly can cause a back strain.  Wear and tear (degeneration) of the spinal disks. Spinal disks are circular tissue that provides cushioning between the bones of the spine (vertebrae).  Twisting motions, such as while playing sports or doing yard work.  A hit to the back.  Arthritis. You may have a physical exam, lab tests, and imaging tests to find the cause of your pain. Acute back pain usually goes away with rest and home care. Follow these instructions at home: Managing pain, stiffness, and swelling  Take over-the-counter and prescription medicines only as told by your health care provider.  Your health care provider may recommend applying ice during the first 24-48 hours after your pain starts. To do this: ? Put ice in a plastic bag. ? Place a towel between your skin and the bag. ? Leave the ice on for 20 minutes, 2-3 times a day.  If directed, apply heat to the affected area as often as told by your health care provider. Use the heat source that your health care provider recommends, such as a moist heat pack or a heating pad. ? Place a towel between your skin and the heat source. ? Leave the heat on for 20-30 minutes. ? Remove the heat if your skin turns bright red. This is especially important if you are unable to feel pain, heat, or cold. You have a greater risk of getting burned. Activity   Do not stay in bed. Staying in bed for more than 1-2 days can delay your recovery.  Sit up and stand up straight. Avoid leaning forward when you sit, or hunching over when you stand. ? If you work at a desk, sit close to it so you do not need to lean over. Keep your chin tucked in. Keep your neck drawn back, and keep your elbows  bent at a right angle. Your arms should look like the letter "L." ? Sit high and close to the steering wheel when you drive. Add lower back (lumbar) support to your car seat, if needed.  Take short walks on even surfaces as soon as you are able. Try to increase the length of time you walk each day.  Do not sit, drive, or stand in one place for more than 30 minutes at a time. Sitting or standing for long periods of time can put stress on your back.  Do not drive or use heavy machinery while taking prescription pain medicine.  Use proper lifting techniques. When you bend and lift, use positions that put less stress on your back: ? [place] your knees. ? Keep the load close to your body. ? Avoid twisting.  Exercise regularly as told by your health care provider. Exercising helps your back heal faster and helps prevent back injuries by keeping muscles strong and flexible.  Work with a physical therapist to make a safe exercise program, as recommended by your health care provider. Do any exercises as told by your physical therapist. Lifestyle  Maintain a healthy  weight. Extra weight puts stress on your back and makes it difficult to have good posture.  Avoid activities or situations that make you feel anxious or stressed. Stress and anxiety increase muscle tension and can make back pain worse. Learn ways to manage anxiety and stress, such as through exercise. General instructions  Sleep on a firm mattress in a comfortable position. Try lying on your side with your knees slightly bent. If you lie on your back, put a pillow under your knees.  Follow your treatment plan as told by your health care provider. This may include: ? Cognitive or behavioral therapy. ? Acupuncture or massage therapy. ? Meditation or yoga. Contact a health care provider if:  You have pain that is not relieved with rest or medicine.  You have increasing pain going down into your legs or buttocks.  Your pain does not  improve after 2 weeks.  You have pain at night.  You lose weight without trying.  You have a fever or chills. Get help right away if:  You develop new bowel or bladder control problems.  You have unusual weakness or numbness in your arms or legs.  You develop nausea or vomiting.  You develop abdominal pain.  You feel faint. Summary  Acute back pain is sudden and usually short-lived.  Use proper lifting techniques. When you bend and lift, use positions that put less stress on your back.  Take over-the-counter and prescription medicines and apply heat or ice as directed by your health care provider. This information is not intended to replace advice given to you by your health care provider. Make sure you discuss any questions you have with your health care provider. Document Revised: 10/07/2018 Document Reviewed: 01/30/2017 Elsevier Patient Education  [place].

## 2019-12-30 ENCOUNTER — Other Ambulatory Visit: Payer: Self-pay

## 2019-12-30 ENCOUNTER — Telehealth: Payer: Self-pay

## 2019-12-30 DIAGNOSIS — M545 Low back pain, unspecified: Secondary | ICD-10-CM

## 2019-12-30 MED ORDER — TIZANIDINE HCL 4 MG PO CAPS: 4.0000 mg | ORAL_CAPSULE | Freq: Every evening | ORAL | 1 refills | Status: DC | PRN

## 2019-12-30 NOTE — Telephone Encounter (Signed)
-----   Message from Towanda Malkin, MD sent at 12/30/2019  7:38 AM EDT ----- Regarding: FW: One refill Can you help with this refill, I am ok with this. Thanks. ----- Message ----- From: Verdia Kuba, [facility] Sent: 12/29/2019   9:55 PM EDT To: Towanda Malkin, MD Subject: One refill                                     Hello Dr. Roxan Hockey,  I'm sending the note to Dr. Ancil Boozer who is listed as PCP, but patient wanted to use if you could re-order the Zanaflex. She is using it only at bedtime but continues to benefit without gait/balance issues. It was three times daily as needed.  Tizanadine 4mg  1 tab by mouth at bedtime   Thanks,  Sheppard Plumber, PharmD, [place], [facility] [phone]

## 2019-12-30 NOTE — Telephone Encounter (Signed)
Prescription was ordered per Dr. Roxan Hockey.

## 2020-01-07 ENCOUNTER — Other Ambulatory Visit: Payer: Self-pay

## 2020-01-07 ENCOUNTER — Ambulatory Visit (INDEPENDENT_AMBULATORY_CARE_PROVIDER_SITE_OTHER): Payer: Medicare Other | Admitting: Family Medicine

## 2020-01-07 ENCOUNTER — Encounter: Payer: Self-pay | Admitting: Family Medicine

## 2020-01-07 VITALS — BP 130/70 | HR 65 | Temp 96.8°F | Resp 16 | Ht 63.0 in | Wt 200.0 lb

## 2020-01-07 DIAGNOSIS — E785 Hyperlipidemia, unspecified: Secondary | ICD-10-CM

## 2020-01-07 DIAGNOSIS — Z1159 Encounter for screening for other viral diseases: Secondary | ICD-10-CM | POA: Diagnosis not present

## 2020-01-07 DIAGNOSIS — I701 Atherosclerosis of renal artery: Secondary | ICD-10-CM

## 2020-01-07 DIAGNOSIS — E1159 Type 2 diabetes mellitus with other circulatory complications: Secondary | ICD-10-CM

## 2020-01-07 DIAGNOSIS — M48062 Spinal stenosis, lumbar region with neurogenic claudication: Secondary | ICD-10-CM

## 2020-01-07 DIAGNOSIS — M545 Low back pain, unspecified: Secondary | ICD-10-CM

## 2020-01-07 DIAGNOSIS — I4892 Unspecified atrial flutter: Secondary | ICD-10-CM

## 2020-01-07 DIAGNOSIS — E1169 Type 2 diabetes mellitus with other specified complication: Secondary | ICD-10-CM | POA: Diagnosis not present

## 2020-01-07 DIAGNOSIS — I152 Hypertension secondary to endocrine disorders: Secondary | ICD-10-CM

## 2020-01-07 DIAGNOSIS — D692 Other nonthrombocytopenic purpura: Secondary | ICD-10-CM

## 2020-01-07 DIAGNOSIS — R600 Localized edema: Secondary | ICD-10-CM

## 2020-01-07 DIAGNOSIS — I10 Essential (primary) hypertension: Secondary | ICD-10-CM | POA: Diagnosis not present

## 2020-01-07 DIAGNOSIS — M810 Age-related osteoporosis without current pathological fracture: Secondary | ICD-10-CM

## 2020-01-07 DIAGNOSIS — G8929 Other chronic pain: Secondary | ICD-10-CM

## 2020-01-07 DIAGNOSIS — M62830 Muscle spasm of back: Secondary | ICD-10-CM

## 2020-01-07 MED ORDER — TIZANIDINE HCL 2 MG PO CAPS: 2.0000 mg | ORAL_CAPSULE | Freq: Every evening | ORAL | 0 refills | Status: DC

## 2020-01-07 MED ORDER — ROSUVASTATIN CALCIUM 10 MG PO TABS: 10.0000 mg | ORAL_TABLET | ORAL | 1 refills | Status: DC

## 2020-01-07 MED ORDER — LOSARTAN POTASSIUM-HCTZ 100-25 MG PO TABS: 1.0000 | ORAL_TABLET | Freq: Every morning | ORAL | 1 refills | Status: DC

## 2020-01-07 NOTE — Progress Notes (Signed)
Name: Bonnie Rowland   MRN: [id number]    DOB: 11-Nov-1940   Date:01/07/2020       Progress Note  Subjective  Chief Complaint  Chief Complaint  Patient presents with  . Posture    She is working on trying to sit striaght. She startes that she has phone neck.    HPI  Osteoporosis: shewas on Evista for years but bone density got worse she was on Reclast but bone density did not improve, and is on Prolia now - had one dose Spring 2021 , reviewed last bone density  DM: seeing Endocrinologist- Dr. Tawanna Solo to date with eye exam, hgbA1Cwas done 08/2019 and A1C was down to 6.7 %  She ison Bydureon, pre-meal insulin and basal insulin,sheShe has side effect of diarrhea with Bydureon but stable - states going on for many years. Denies polyphagia, polydipsia or polyuria   HTN: BP at home is usually around 128/60-70's, today improved with rest. She denies chest pain, palpitation or SOB  Atherosclerosis of aorta: on statin therapy and also aspirin.Last LDL was below 50, continue Crestor four times a week.   Senile purpura:both arms and hands. Unchanged   Morbid Obesity: BMI above 35 with co-morbidities.She has DM, HTN, dyslipidemia, atherosclerosis of aorta. She is more active since she moved to [facility], doing classes walking outside   Dysphagia : she has noticed some dysphagia with solids, she states if she drinks or swallows it goes away quickly . Discussed referral to GI and we will monitor for now and refer when ready   Atrial Flutter: doing well on metoprolol, denies chest pain   Back pain: seen by Dr. Roxan Hockey for acute back pain, spasms no radiculitis and was given zanaflex 4 mg , tolerating it well , taking at night, no dizziness during the day, she has been able to resume regular activities but states it helps her symptoms, we will change to 2 mg and she will contact me to let me know the best dose for her   Patient Active Problem List   Diagnosis Date Noted  .  Acute left-sided low back pain without sciatica 11/25/2019  . Trochanteric bursitis of left hip 11/25/2019  . Plantar fasciitis, bilateral 05/21/2019  . Age-related osteoporosis without current pathological fracture 11/05/2017  . 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([place]) 07/09/2017  . Atherosclerosis of abdominal aorta ([place]) 07/09/2017  . Bilateral carotid bruits 08/24/2015  . 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 Atrial flutter ([place]) 02/22/2015  . Chronic pain 01/10/2015  . 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 Fatigue 01/10/2015  . History of melanoma excision 01/10/2015  . Morbid obesity ([place]) 01/10/2015  . 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 Spinal stenosis, lumbar region, 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  . DDD (degenerative disc disease), lumbar 11/14/2014  . Greater trochanteric bursitis of both hips 11/14/2014  . Piriformis syndrome 11/14/2014  . 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 Hyperlipemia 11/07/2009  . Hypertension, benign 11/07/2009  . Atherosclerosis of renal artery ([place]) 11/07/2009  . Hyperlipidemia due to type 2 diabetes mellitus ([place]) 11/07/2009  . Perennial allergic rhinitis 11/27/2007  . Lumbosacral neuritis 01/17/2007    Past Surgical History:  Procedure Laterality Date  . APPENDECTOMY    . CARDIAC CATHETERIZATION    . cataract surgery    . COLONOSCOPY    . COLONOSCOPY WITH PROPOFOL N/A 05/09/2016   Procedure: COLONOSCOPY WITH PROPOFOL;  Surgeon: Robert Bellow, MD;  Location: [facility] ENDOSCOPY;  Service: Endoscopy;  Laterality: N/A;  . DIAGNOSTIC MAMMOGRAM    . EYE SURGERY Bilateral    Cataract Extraction  . KNEE ARTHROPLASTY Right 03/30/2015   Procedure: COMPUTER ASSISTED TOTAL KNEE ARTHROPLASTY;  Surgeon: Dereck Leep, MD;  Location: [facility] ORS;  Service: Orthopedics;  Laterality: Right;  . KNEE ARTHROSCOPY Right   . KNEE CLOSED REDUCTION Right 05/23/2015    Procedure: CLOSED MANIPULATION KNEE;  Surgeon: Dereck Leep, MD;  Location: [facility] ORS;  Service: Orthopedics;  Laterality: Right;  . TONSILLECTOMY      Family History  Problem Relation Age of Onset  . Diabetes Mother   . Hypertension Mother   . Cancer Mother   . Hypertension Father   . Cancer Father        Prostate  . Breast cancer Maternal Aunt 40  . Colon cancer Son   . Kidney cancer Neg Hx   . Bladder Cancer Neg Hx     Social History   Tobacco Use  . Smoking status: Never Smoker  . Smokeless tobacco: Never Used  Substance Use Topics  . Alcohol use: Not Currently    Alcohol/week: 0.0 standard drinks     Current Outpatient Medications:  .  acetaminophen (TYLENOL) 500 MG tablet, Take 500 mg by mouth every 6 (six) hours as needed. Takes 1300mg  at bedtime with Zanaflex, Disp: , Rfl:  .  ALFALFA PO, Take 3 tablets by mouth every morning., Disp: , Rfl:  .  aspirin 81 MG EC tablet, Take 81 mg by mouth at bedtime. , Disp: , Rfl:  .  B Complex-C (B-COMPLEX WITH VITAMIN C) tablet, Take 1 tablet by mouth at bedtime. , Disp: , Rfl:  .  BD PEN NEEDLE NANO U/F 32G X 4 MM MISC, USE 4 TIMES DAILY (HUMALOG 3 TIMES DAILY AND LANTUS ONCE DAILY) OFFICE NOTIFIED 08/06/17, Disp: 90 each, Rfl: 1 .  BLACK ELDERBERRY,BERRY-FLOWER, PO, Take by mouth., Disp: , Rfl:  .  BYDUREON 2 MG PEN, INJECT 2 MG SUBCUTANEOUSLY ONCE A WEEK, Disp: , Rfl:  .  calcium carbonate (OS-CAL) 600 MG TABS tablet, Take 1,000 mg by mouth daily. , Disp: , Rfl:  .  cholecalciferol (VITAMIN D) 1000 UNITS tablet, Take 1,000 Units by mouth daily. , Disp: , Rfl:  .  clobetasol ointment (TEMOVATE) 4.31 %, APPLY 1 APPLICATION TOPICALLY 2 (TWO) TIMES DAILY AS NEEDED (BUG BITES)., Disp: 30 g, Rfl: 0 .  Coenzyme Q10 (COQ10) 200 MG CAPS, Take 1 tablet by mouth daily. 100 mg every am., Disp: , Rfl:  .  Continuous Blood Gluc Sensor ([place]) MISC, by Does not apply route., Disp: , Rfl:  .  Elastic Bandages & Supports  ([place]) MISC, 1 each by Does not apply route daily., Disp: 2 each, Rfl: 5 .  estradiol (ESTRACE VAGINAL) 0.1 MG/GM vaginal cream, 1/2 gm once weekly using applicator, apply blueberry sized amount of cream using tip of finger to urethra twice weekly (Patient taking differently: Place vaginally 2 (two) times a week. 1/2 gm once weekly using applicator, apply blueberry sized amount of cream using tip of finger to urethra twice weekly), Disp: 50 g, Rfl: 4 .  Eyelid Cleansers (AVENOVA) 0.01 % SOLN, See admin instructions., Disp: , Rfl: 3 .  fish oil-omega-3 fatty acids 1000 MG capsule, Take 500 mg by mouth daily. Krill Oil, Disp: , Rfl:  .  fluconazole (DIFLUCAN) 150 MG tablet, Take 1 tablet (150 mg total) by mouth  every other day., Disp: 3 tablet, Rfl: 0 .  fluticasone (FLONASE) 50 MCG/ACT nasal spray, PLACE 2 SPRAYS INTO BOTH NOSTRILS DAILY., Disp: 8.5 g, Rfl: 2 .  GARLIC 7616 PO, Take 1 tablet by mouth daily. 1 gram, Disp: , Rfl:  .  Glucosamine 500 MG TABS, Take 1 tablet by mouth daily. , Disp: , Rfl:  .  HUMALOG KWIKPEN 100 UNIT/ML KiwkPen, Inject 6 Units into the skin 3 (three) times daily after meals. Pt taking before meals per Dr. Honor Junes, sometimes just twice Maximum 11 units per patient, Disp: , Rfl: 3 .  Insulin Glargine (LANTUS SOLOSTAR) 100 UNIT/ML Solostar Pen, Inject 24 Units into the skin every morning. (Patient taking differently: Inject 22 Units into the skin every morning. ), Disp: 15 pen, Rfl: 2 .  losartan-hydrochlorothiazide (HYZAAR) 100-25 MG tablet, TAKE 1 TABLET BY MOUTH EVERY DAY (Patient taking differently: in the morning. ), Disp: 90 tablet, Rfl: 0 .  Mag Aspart-Potassium Aspart (POTASSIUM & MAGNESIUM ASPARTAT PO), Take 1 tablet by mouth daily., Disp: , Rfl:  .  metoprolol succinate (TOPROL-XL) 25 MG 24 hr tablet, Take 1 tablet (25 mg total) by mouth daily. (Patient taking differently: Take 12.5 mg by mouth 2 (two) times daily. ), Disp: 90 tablet, Rfl:  3 .  Multiple Vitamins-Minerals (PRESERVISION AREDS 2) CAPS, Take 1 capsule by mouth 2 (two) times daily., Disp: , Rfl:  .  nystatin cream (MYCOSTATIN), Apply 1 application topically 2 (two) times daily., Disp: 60 g, Rfl: 2 .  Probiotic Product (PROBIOTIC PO), Take 1 Dose by mouth at bedtime., Disp: , Rfl:  .  PROLIA 60 MG/ML SOSY injection, , Disp: , Rfl:  .  Propylene Glycol (SYSTANE BALANCE OP), Apply 1 drop to eye as needed., Disp: , Rfl:  .  rosuvastatin (CRESTOR) 10 MG tablet, Take 1 tablet (10 mg total) by mouth daily. (Patient taking differently: Take 10 mg by mouth 3 (three) times a week. ), Disp: 90 tablet, Rfl: 1 .  sodium fluoride (DENTAGEL) 1.1 % GEL dental gel, , Disp: , Rfl:  .  sulfacetamide (BLEPH-10) 10 % ophthalmic solution, Place 1 drop into both eyes 3 (three) times daily as needed., Disp: , Rfl:  .  tiZANidine (ZANAFLEX) 4 MG capsule, Take 1 capsule (4 mg total) by mouth at bedtime as needed for muscle spasms. May make drowsy, not to drive while taking., Disp: 30 capsule, Rfl: 1 .  Vitamin D, Ergocalciferol, (DRISDOL) 1.25 MG (50000 UT) CAPS capsule, Take 1 capsule by mouth once a week., Disp: , Rfl:  .  vitamin E (VITAMIN E) 400 UNIT capsule, Take 400 Units by mouth daily. Every am, Disp: , Rfl:   Allergies  Allergen Reactions  . Cyclobenzaprine Hypertension  . Cheese Other (See Comments)    bloating  . Ciprofloxacin Hcl Other (See Comments)    Muscle pain  . Coconut Oil   . Keflex [Cephalexin] Other (See Comments)    Patient prefers not to take this medication due to the side effects    I personally reviewed active problem list, medication list, allergies, family history, social history, health maintenance with the patient/caregiver today.   ROS  Constitutional: Negative for fever or weight change.  Respiratory: Negative for cough and shortness of breath.   Cardiovascular: Negative for chest pain or palpitations.  Gastrointestinal: Negative for abdominal pain,  no bowel changes.  Musculoskeletal: Negative for gait problem or joint swelling.  Skin: Negative for rash.  Neurological: Negative for dizziness or headache.  No other specific complaints in a complete review of systems (except as listed in HPI above).  Objective  Vitals:   01/07/20 0913 01/07/20 0914  BP: 140/60 130/70  Pulse: 65   Resp: 16   Temp: (!) 96.8 F (36 C)   TempSrc: Temporal   SpO2: 94%   Weight: 200 lb (90.7 kg)   Height: 5\' 3"  (1.6 m)     Body mass index is 35.43 kg/m.  Physical Exam  Constitutional: Patient appears well-developed and well-nourished. Obese  No distress.  HEENT: head atraumatic, normocephalic, pupils equal and reactive to light,  neck supple  Cardiovascular: Normal rate, regular rhythm and normal heart sounds.  No murmur heard. No BLE edema. Pulmonary/Chest: Effort normal and breath sounds normal. No respiratory distress. Abdominal: Soft.  There is no tenderness. Muscular Skeletal: negative straight leg raise Psychiatric: Patient has a normal mood and affect. behavior is normal. Judgment and thought content normal.    PHQ2/9: Depression screen [facility] 2/9 01/07/2020 11/25/2019 07/10/2019 04/17/2019 01/08/2019  Decreased Interest 0 0 0 0 0  Down, Depressed, Hopeless 0 0 0 0 0  PHQ - 2 Score 0 0 0 0 0  Altered sleeping 0 0 0 - 0  Tired, decreased energy 0 0 0 - 0  Change in appetite 0 0 0 - 0  Feeling bad or failure about yourself  0 0 0 - 0  Trouble concentrating 0 0 0 - 0  Moving slowly or fidgety/restless 0 0 0 - 0  Suicidal thoughts 0 0 0 - 0  PHQ-9 Score 0 0 0 - 0  Difficult doing work/chores - Not difficult at all - - -    phq 9 is negative   Fall Risk: Fall Risk  01/07/2020 11/25/2019 07/10/2019 04/17/2019 01/08/2019  Falls in the past year? 0 0 0 0 0  Comment - - - - -  Number falls in past yr: 0 0 0 0 0  Injury with Fall? 0 0 0 0 0  Risk for fall due to : - - - - -  Risk for fall due to: Comment - - - - -  Follow up - - - Falls  prevention discussed -      Assessment & Plan  1. Hypertension, benign  - losartan-hydrochlorothiazide (HYZAAR) 100-25 MG tablet; Take 1 tablet by mouth in the morning.  Dispense: 90 tablet; Refill: 1  2. Chronic left-sided low back pain without sciatica  She would like to continue zanaflex prn   3. Dyslipidemia associated with type 2 diabetes mellitus (HCC)  - rosuvastatin (CRESTOR) 10 MG tablet; Take 1 tablet (10 mg total) by mouth 4 (four) times a week.  Dispense: 48 tablet; Refill: 1  4. Atherosclerosis of renal artery (HCC)  - rosuvastatin (CRESTOR) 10 MG tablet; Take 1 tablet (10 mg total) by mouth 4 (four) times a week.  Dispense: 48 tablet; Refill: 1  5. Need for hepatitis C screening test  - Hepatitis C antibody  6. Osteoporosis, post-menopausal   7. 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(HCC)  Stable and given reassurance   8. Morbid obesity ([place])  Discussed with the patient the risk posed by an increased BMI. Discussed importance of portion control, calorie counting and at least 150 minutes of physical activity weekly. Avoid sweet beverages and drink more water. Eat at least 6 servings of fruit and vegetables daily   9. Bilateral lower extremity edema  Left worse than right, stable and has seen vascular surgeon before   10. Spinal  stenosis, lumbar region, with neurogenic claudication   11. Hypertension associated with type 2 diabetes mellitus ([place])  At goal   12. Atrial flutter, unspecified type ([place])  On beta blocker, sees Dr. Rockey Situ once a year

## 2020-01-08 LAB — HEPATITIS C ANTIBODY
Hepatitis C Ab: NONREACTIVE
SIGNAL TO CUT-OFF: 0.01 (ref <1.00)

## 2020-01-13 LAB — HM DIABETES EYE EXAM

## 2020-01-14 DIAGNOSIS — M9903 Segmental and somatic dysfunction of lumbar region: Secondary | ICD-10-CM | POA: Diagnosis not present

## 2020-01-14 DIAGNOSIS — M9901 Segmental and somatic dysfunction of cervical region: Secondary | ICD-10-CM | POA: Diagnosis not present

## 2020-01-14 DIAGNOSIS — M5416 Radiculopathy, lumbar region: Secondary | ICD-10-CM | POA: Diagnosis not present

## 2020-01-14 DIAGNOSIS — M5033 Other cervical disc degeneration, cervicothoracic region: Secondary | ICD-10-CM | POA: Diagnosis not present

## 2020-01-25 ENCOUNTER — Encounter: Payer: Self-pay | Admitting: Family Medicine

## 2020-01-25 ENCOUNTER — Other Ambulatory Visit: Payer: Self-pay | Admitting: Family Medicine

## 2020-01-25 DIAGNOSIS — M62830 Muscle spasm of back: Secondary | ICD-10-CM

## 2020-01-25 DIAGNOSIS — G8929 Other chronic pain: Secondary | ICD-10-CM

## 2020-01-25 MED ORDER — TIZANIDINE HCL 2 MG PO CAPS: 2.0000 mg | ORAL_CAPSULE | Freq: Every evening | ORAL | 0 refills | Status: DC

## 2020-01-28 ENCOUNTER — Telehealth: Payer: Self-pay | Admitting: Family Medicine

## 2020-01-28 DIAGNOSIS — Z794 Long term (current) use of insulin: Secondary | ICD-10-CM | POA: Diagnosis not present

## 2020-01-28 DIAGNOSIS — I152 Hypertension secondary to endocrine disorders: Secondary | ICD-10-CM | POA: Diagnosis not present

## 2020-01-28 DIAGNOSIS — E1165 Type 2 diabetes mellitus with hyperglycemia: Secondary | ICD-10-CM | POA: Diagnosis not present

## 2020-01-28 DIAGNOSIS — E1159 Type 2 diabetes mellitus with other circulatory complications: Secondary | ICD-10-CM | POA: Diagnosis not present

## 2020-01-28 DIAGNOSIS — M810 Age-related osteoporosis without current pathological fracture: Secondary | ICD-10-CM | POA: Diagnosis not present

## 2020-01-28 DIAGNOSIS — E1169 Type 2 diabetes mellitus with other specified complication: Secondary | ICD-10-CM | POA: Diagnosis not present

## 2020-01-28 DIAGNOSIS — E785 Hyperlipidemia, unspecified: Secondary | ICD-10-CM | POA: Diagnosis not present

## 2020-01-28 NOTE — Telephone Encounter (Signed)
[facility] called in stated that pt ins will not cover the capsules for the tizanidine (ZANAFLEX) 2 MG capsule [[id number]]  It will now only cover the TABs.  Please advise

## 2020-01-29 ENCOUNTER — Other Ambulatory Visit: Payer: Self-pay | Admitting: Family Medicine

## 2020-01-29 MED ORDER — TIZANIDINE HCL 2 MG PO TABS: 2.0000 mg | ORAL_TABLET | Freq: Four times a day (QID) | ORAL | 0 refills | Status: DC | PRN

## 2020-02-05 ENCOUNTER — Other Ambulatory Visit: Payer: Self-pay | Admitting: Family Medicine

## 2020-02-05 DIAGNOSIS — G8929 Other chronic pain: Secondary | ICD-10-CM

## 2020-02-05 DIAGNOSIS — M62830 Muscle spasm of back: Secondary | ICD-10-CM

## 2020-02-16 ENCOUNTER — Telehealth (INDEPENDENT_AMBULATORY_CARE_PROVIDER_SITE_OTHER): Payer: Medicare Other | Admitting: Family Medicine

## 2020-02-16 ENCOUNTER — Other Ambulatory Visit: Payer: Self-pay

## 2020-02-16 ENCOUNTER — Encounter: Payer: Self-pay | Admitting: Family Medicine

## 2020-02-16 VITALS — Ht 63.0 in | Wt 200.0 lb

## 2020-02-16 DIAGNOSIS — E1169 Type 2 diabetes mellitus with other specified complication: Secondary | ICD-10-CM

## 2020-02-16 DIAGNOSIS — L237 Allergic contact dermatitis due to plants, except food: Secondary | ICD-10-CM | POA: Diagnosis not present

## 2020-02-16 DIAGNOSIS — E669 Obesity, unspecified: Secondary | ICD-10-CM | POA: Diagnosis not present

## 2020-02-16 DIAGNOSIS — I701 Atherosclerosis of renal artery: Secondary | ICD-10-CM | POA: Diagnosis not present

## 2020-02-16 MED ORDER — PREDNISONE 5 MG (48) PO TBPK: ORAL_TABLET | ORAL | 0 refills | Status: DC

## 2020-02-16 NOTE — Progress Notes (Signed)
Name: Bonnie Rowland   MRN: [id number]    DOB: 07/08/1940   Date:02/16/2020       Progress Note  Subjective  Chief Complaint  Chief Complaint  Patient presents with  . Rash    thinks it poison ivy    I connected with  Bonnie Rowland  on 02/16/20 at  3:40 PM EDT by a video enabled telemedicine application and verified that I am speaking with the correct person using two identifiers.  I discussed the limitations of evaluation and management by telemedicine and the availability of in person appointments. The patient expressed understanding and agreed to proceed. Staff also discussed with the patient that there may be a patient responsible charge related to this service. Patient Location: in her car  Provider Location: [facility]   HPI   She states typical for her poison ivy , started on her knees and hands, blisters and itching, she has been using topical clobetasol but noticed today it was spreading to her face and got concerned. No respiratory problems. She has DM, takes insulin but glucose has been well controlled.  Glucose has been under good control 90 low 100's , explained that oral prednisone will increase her glucose levels and she needs to be very strict with her diet. She needs to drink plenty of fluids and monitor her glucose multiple times a day, she can reach out to [place] if she gets glucose levels above 200 consistently and I can adjust dose of insulin temporarily   Patient Active Problem List   Diagnosis Date Noted  . Acute left-sided low back pain without sciatica 11/25/2019  . Trochanteric bursitis of left hip 11/25/2019  . Plantar fasciitis, bilateral 05/21/2019  . Age-related osteoporosis without current pathological fracture 11/05/2017  . 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([place]) 07/09/2017  . Atherosclerosis of abdominal aorta ([place]) 07/09/2017  . Bilateral carotid bruits 08/24/2015  . 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 Atrial flutter ([place])  02/22/2015  . Chronic pain 01/10/2015  . 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 Fatigue 01/10/2015  . History of melanoma excision 01/10/2015  . Morbid obesity ([place]) 01/10/2015  . 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 Spinal stenosis, lumbar region, 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  . DDD (degenerative disc disease), lumbar 11/14/2014  . Greater trochanteric bursitis of both hips 11/14/2014  . Piriformis syndrome 11/14/2014  . 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 Hyperlipemia 11/07/2009  . Hypertension, benign 11/07/2009  . Atherosclerosis of renal artery ([place]) 11/07/2009  . Hyperlipidemia due to type 2 diabetes mellitus ([place]) 11/07/2009  . Perennial allergic rhinitis 11/27/2007  . Lumbosacral neuritis 01/17/2007    Social History   Tobacco Use  . Smoking status: Never Smoker  . Smokeless tobacco: Never Used  Substance Use Topics  . Alcohol use: Not Currently    Alcohol/week: 0.0 standard drinks     Current Outpatient Medications:  .  acetaminophen (TYLENOL) 500 MG tablet, Take 500 mg by mouth every 6 (six) hours as needed. Takes 1300mg  at bedtime with Zanaflex, Disp: , Rfl:  .  ALFALFA PO, Take 3 tablets by mouth every morning., Disp: , Rfl:  .  aspirin 81 MG EC tablet, Take 81 mg by mouth at bedtime. , Disp: , Rfl:  .  B Complex-C (B-COMPLEX WITH VITAMIN C) tablet, Take 1 tablet by mouth at bedtime. , Disp: , Rfl:  .  BD PEN NEEDLE NANO U/F 32G X  4 MM MISC, USE 4 TIMES DAILY (HUMALOG 3 TIMES DAILY AND LANTUS ONCE DAILY) OFFICE NOTIFIED 08/06/17, Disp: 90 each, Rfl: 1 .  BLACK ELDERBERRY,BERRY-FLOWER, PO, Take by mouth., Disp: , Rfl:  .  BYDUREON 2 MG PEN, INJECT 2 MG SUBCUTANEOUSLY ONCE A WEEK, Disp: , Rfl:  .  calcium carbonate (OS-CAL) 600 MG TABS tablet, Take 1,000 mg by mouth daily. , Disp: , Rfl:  .  cholecalciferol (VITAMIN D) 1000 UNITS tablet, Take 1,000 Units by mouth daily. , Disp: , Rfl:  .   clobetasol ointment (TEMOVATE) 9.51 %, APPLY 1 APPLICATION TOPICALLY 2 (TWO) TIMES DAILY AS NEEDED (BUG BITES)., Disp: 30 g, Rfl: 0 .  Coenzyme Q10 (COQ10) 200 MG CAPS, Take 1 tablet by mouth daily. 100 mg every am., Disp: , Rfl:  .  Continuous Blood Gluc Sensor ([place]) MISC, by Does not apply route., Disp: , Rfl:  .  Elastic Bandages & Supports ([place]) MISC, 1 each by Does not apply route daily., Disp: 2 each, Rfl: 5 .  estradiol (ESTRACE VAGINAL) 0.1 MG/GM vaginal cream, 1/2 gm once weekly using applicator, apply blueberry sized amount of cream using tip of finger to urethra twice weekly (Patient taking differently: Place vaginally 2 (two) times a week. 1/2 gm once weekly using applicator, apply blueberry sized amount of cream using tip of finger to urethra twice weekly), Disp: 50 g, Rfl: 4 .  Eyelid Cleansers (AVENOVA) 0.01 % SOLN, See admin instructions., Disp: , Rfl: 3 .  fish oil-omega-3 fatty acids 1000 MG capsule, Take 500 mg by mouth daily. Krill Oil, Disp: , Rfl:  .  fluticasone (FLONASE) 50 MCG/ACT nasal spray, PLACE 2 SPRAYS INTO BOTH NOSTRILS DAILY., Disp: 8.5 g, Rfl: 2 .  GARLIC 8841 PO, Take 1 tablet by mouth daily. 1 gram, Disp: , Rfl:  .  Glucosamine 500 MG TABS, Take 1 tablet by mouth daily. , Disp: , Rfl:  .  HUMALOG KWIKPEN 100 UNIT/ML KiwkPen, Inject 6 Units into the skin 3 (three) times daily after meals. Pt taking before meals per Dr. Honor Junes, sometimes just twice Maximum 11 units per patient, Disp: , Rfl: 3 .  Insulin Glargine (LANTUS SOLOSTAR) 100 UNIT/ML Solostar Pen, Inject 24 Units into the skin every morning. (Patient taking differently: Inject 22 Units into the skin every morning. ), Disp: 15 pen, Rfl: 2 .  losartan-hydrochlorothiazide (HYZAAR) 100-25 MG tablet, Take 1 tablet by mouth in the morning., Disp: 90 tablet, Rfl: 1 .  Mag Aspart-Potassium Aspart (POTASSIUM & MAGNESIUM ASPARTAT PO), Take 1 tablet by mouth daily.,  Disp: , Rfl:  .  metoprolol succinate (TOPROL-XL) 25 MG 24 hr tablet, Take 1 tablet (25 mg total) by mouth daily. (Patient taking differently: Take 12.5 mg by mouth 2 (two) times daily. ), Disp: 90 tablet, Rfl: 3 .  Multiple Vitamins-Minerals (PRESERVISION AREDS 2) CAPS, Take 1 capsule by mouth 2 (two) times daily., Disp: , Rfl:  .  nystatin cream (MYCOSTATIN), Apply 1 application topically 2 (two) times daily., Disp: 60 g, Rfl: 2 .  Probiotic Product (PROBIOTIC PO), Take 1 Dose by mouth at bedtime., Disp: , Rfl:  .  PROLIA 60 MG/ML SOSY injection, , Disp: , Rfl:  .  Propylene Glycol (SYSTANE BALANCE OP), Apply 1 drop to eye as needed., Disp: , Rfl:  .  rosuvastatin (CRESTOR) 10 MG tablet, Take 1 tablet (10 mg total) by mouth 4 (four) times a week., Disp: 48 tablet, Rfl: 1 .  sodium  fluoride (DENTAGEL) 1.1 % GEL dental gel, , Disp: , Rfl:  .  sulfacetamide (BLEPH-10) 10 % ophthalmic solution, Place 1 drop into both eyes 3 (three) times daily as needed., Disp: , Rfl:  .  tiZANidine (ZANAFLEX) 2 MG tablet, Take 1 tablet (2 mg total) by mouth every 6 (six) hours as needed for muscle spasms., Disp: 120 tablet, Rfl: 0 .  Vitamin D, Ergocalciferol, (DRISDOL) 1.25 MG (50000 UT) CAPS capsule, Take 1 capsule by mouth once a week., Disp: , Rfl:  .  vitamin E (VITAMIN E) 400 UNIT capsule, Take 400 Units by mouth daily. Every am, Disp: , Rfl:   Allergies  Allergen Reactions  . Cyclobenzaprine Hypertension  . Cheese Other (See Comments)    bloating  . Ciprofloxacin Hcl Other (See Comments)    Muscle pain  . Coconut Oil   . Keflex [Cephalexin] Other (See Comments)    Patient prefers not to take this medication due to the side effects    I personally reviewed active problem list, medication list, allergies, family history, social history with the patient/caregiver today.  Review of Systems  Constitutional: Negative for chills and fever.  Respiratory: Negative for shortness of breath.     Cardiovascular: Negative for chest pain.  Skin: Positive for itching and rash.     Objective  Virtual encounter, vitals not obtained.  Body mass index is 35.43 kg/m.  Nursing Note and Vital Signs reviewed.  Physical Exam  Awake, alert and oriented,  Erythematous rash, patchy like on left cheek   Assessment & Plan  1. Poison ivy dermatitis  - predniSONE (STERAPRED UNI-PAK 48 TAB) 5 MG (48) TBPK tablet; Take as directed  Dispense: 48 tablet; Refill: 0  2. Diabetes mellitus type 2 in obese [facility])  Discussed importance of following diabetic diet   -Red flags and when to present for emergency care or RTC including fever >101.67F, chest pain, shortness of breath, new/worsening/un-resolving symptoms,  reviewed with patient at time of visit. Follow up and care instructions discussed and provided in AVS. - I discussed the assessment and treatment plan with the patient. The patient was provided an opportunity to ask questions and all were answered. The patient agreed with the plan and demonstrated an understanding of the instructions.  I provided 15  minutes of non-face-to-face time during this encounter.  Shawna Orleans, CMA

## 2020-02-21 ENCOUNTER — Other Ambulatory Visit: Payer: Self-pay | Admitting: Family Medicine

## 2020-02-21 NOTE — Telephone Encounter (Signed)
Requested medication (s) are due for refill today: no  Requested medication (s) are on the active medication list: yes  Last refill:  01/29/20  Future visit scheduled: yes  Notes to clinic:  med not delegated to NT to RF or refuse   Requested Prescriptions  Pending Prescriptions Disp Refills   tiZANidine (ZANAFLEX) 2 MG tablet [Pharmacy Med Name: TIZANIDINE HCL 2 MG TABLET] 120 tablet 0    Sig: Take 1 tablet (2 mg total) by mouth every 6 (six) hours as needed for muscle spasms.      Not Delegated - Cardiovascular:  Alpha-2 Agonists - tizanidine Failed - 02/21/2020  1:30 PM      Failed - This refill cannot be delegated      Passed - Valid encounter within last 6 months    Recent Outpatient Visits           5 days ago [facility] Steele Sizer, MD   1 month ago Atherosclerosis of renal artery [facility])   [facility] Steele Sizer, MD   2 months ago Acute left-sided low back pain without sciatica   [place], MD   7 months ago 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[facility])   [facility] Steele Sizer, MD   1 year ago Diabetes mellitus type 2 in obese [facility])   [facility] Steele Sizer, MD       Future Appointments             In 1 month  [facility] [place]   In 3 months [place], Gordan Payment [place]   In 4 months Steele Sizer, MD [facility], [facility]

## 2020-03-03 ENCOUNTER — Other Ambulatory Visit: Payer: Self-pay

## 2020-03-03 ENCOUNTER — Ambulatory Visit
Admission: RE | Admit: 2020-03-03 | Discharge: 2020-03-03 | Disposition: A | Discharge status: Home / Self Care | Payer: Medicare Other | Source: Ambulatory Visit | Attending: Physician Assistant | Admitting: Physician Assistant

## 2020-03-03 ENCOUNTER — Ambulatory Visit
Admission: RE | Admit: 2020-03-03 | Discharge: 2020-03-03 | Disposition: A | Discharge status: Home / Self Care | Payer: Medicare Other | Attending: Physician Assistant | Admitting: Physician Assistant

## 2020-03-03 ENCOUNTER — Ambulatory Visit (INDEPENDENT_AMBULATORY_CARE_PROVIDER_SITE_OTHER): Payer: Medicare Other | Admitting: Physician Assistant

## 2020-03-03 DIAGNOSIS — M419 Scoliosis, unspecified: Secondary | ICD-10-CM | POA: Diagnosis not present

## 2020-03-03 DIAGNOSIS — R109 Unspecified abdominal pain: Secondary | ICD-10-CM

## 2020-03-03 DIAGNOSIS — Z87442 Personal history of urinary calculi: Secondary | ICD-10-CM

## 2020-03-03 DIAGNOSIS — M5416 Radiculopathy, lumbar region: Secondary | ICD-10-CM | POA: Diagnosis not present

## 2020-03-03 DIAGNOSIS — M9903 Segmental and somatic dysfunction of lumbar region: Secondary | ICD-10-CM | POA: Diagnosis not present

## 2020-03-03 DIAGNOSIS — N390 Urinary tract infection, site not specified: Secondary | ICD-10-CM | POA: Diagnosis not present

## 2020-03-03 DIAGNOSIS — M9901 Segmental and somatic dysfunction of cervical region: Secondary | ICD-10-CM | POA: Diagnosis not present

## 2020-03-03 DIAGNOSIS — R1032 Left lower quadrant pain: Secondary | ICD-10-CM | POA: Diagnosis not present

## 2020-03-03 DIAGNOSIS — M5033 Other cervical disc degeneration, cervicothoracic region: Secondary | ICD-10-CM | POA: Diagnosis not present

## 2020-03-03 LAB — URINALYSIS, COMPLETE
Bilirubin, UA: NEGATIVE
Glucose, UA: NEGATIVE
Ketones, UA: NEGATIVE
Leukocytes,UA: NEGATIVE
Nitrite, UA: NEGATIVE
Protein,UA: NEGATIVE
Specific Gravity, UA: 1.015 (ref 1.005–1.030)
Urobilinogen, Ur: 0.2 mg/dL (ref 0.2–1.0)
pH, UA: 7 (ref 5.0–7.5)

## 2020-03-03 LAB — MICROSCOPIC EXAMINATION

## 2020-03-03 NOTE — Progress Notes (Signed)
03/03/2020 1:38 PM   Bonnie Rowland Dec 02, 1940 [id number]  CC: Chief Complaint  Patient presents with  . Urinary Tract Infection    HPI: Bonnie Rowland is a 79 y.o. 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 She is an established [facility] patient who last saw Bonnie Rowland on 06/11/2019 for routine follow-up of the above.  Today she reports a 1 day history of constant pain originating in the urethra and radiating to the left lower quadrant.  She denies fever, chills, nausea, vomiting, and gross hematuria.  Pain is not associated with urination.  CT stone study dated 06/28/2017 revealed a nonobstructing 75mm left upper pole stone.  In-office UA today positive for trace-intact blood; urine microscopy pan negative.  PMH: Past Medical History:  Diagnosis Date  . Acute cystitis   . Allergic rhinitis, cause unspecified   . 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 Bronchitis, not specified as acute or chronic   . 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 Cancer ([place]) 05/2014   basal cell removed left temple  . Cellulitis and abscess of leg, except foot   . Conjunctivitis unspecified   . Dermatophytosis of nail   . Diabetes mellitus    type II  . Esophageal reflux   . Hyperlipidemia   . Hypertension   . 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 Renal artery stenosis ([place])   . Sprain of lumbar region   . Thoracic or lumbosacral neuritis or radiculitis, unspecified   . Urinary tract infection, site not specified     Surgical History: Past Surgical History:  Procedure Laterality Date  . APPENDECTOMY    . CARDIAC CATHETERIZATION    . cataract surgery    . COLONOSCOPY    . COLONOSCOPY WITH PROPOFOL N/A 05/09/2016   Procedure: COLONOSCOPY WITH PROPOFOL;  Surgeon: Bonnie Bellow, MD;  Location: [facility] ENDOSCOPY;  Service: Endoscopy;   Laterality: N/A;  . DIAGNOSTIC MAMMOGRAM    . EYE SURGERY Bilateral    Cataract Extraction  . KNEE ARTHROPLASTY Right 03/30/2015   Procedure: COMPUTER ASSISTED TOTAL KNEE ARTHROPLASTY;  Surgeon: Bonnie Leep, MD;  Location: [facility] ORS;  Service: Orthopedics;  Laterality: Right;  . KNEE ARTHROSCOPY Right   . KNEE CLOSED REDUCTION Right 05/23/2015   Procedure: CLOSED MANIPULATION KNEE;  Surgeon: Bonnie Leep, MD;  Location: [facility] ORS;  Service: Orthopedics;  Laterality: Right;  . TONSILLECTOMY      Home Medications:  Allergies as of 03/03/2020      Reactions   Cyclobenzaprine Hypertension   Cheese Other (See Comments)   bloating   Ciprofloxacin Hcl Other (See Comments)   Muscle pain   Coconut Oil    Keflex [cephalexin] Other (See Comments)   Patient prefers not to take this medication due to the side effects      Medication List       Accurate as of March 03, 2020  1:38 PM. If you have any questions, ask your nurse or doctor.        acetaminophen 500 MG tablet Commonly known as: TYLENOL Take 500 mg by mouth every 6 (six) hours as needed. Takes 1300mg  at bedtime with Zanaflex   ALFALFA PO Take 3 tablets by mouth every morning.   aspirin 81 MG EC tablet Take 81 mg by mouth at bedtime.   Avenova 0.01 %  Soln See admin instructions.   B-complex with vitamin C tablet Take 1 tablet by mouth at bedtime.   BD Pen Needle Nano U/F 32G X 4 MM Misc Generic drug: Insulin Pen Needle USE 4 TIMES DAILY (HUMALOG 3 TIMES DAILY AND LANTUS ONCE DAILY) OFFICE NOTIFIED 08/06/17   BLACK ELDERBERRY(BERRY-FLOWER) PO Take by mouth.   Bydureon 2 MG Pen Generic drug: Exenatide ER INJECT 2 MG SUBCUTANEOUSLY ONCE A WEEK   calcium carbonate 600 MG Tabs tablet Commonly known as: OS-CAL Take 1,000 mg by mouth daily.   cholecalciferol 1000 units tablet Commonly known as: VITAMIN D Take 1,000 Units by mouth daily.   clobetasol ointment 0.05 % Commonly known as: TEMOVATE APPLY 1  APPLICATION TOPICALLY 2 (TWO) TIMES DAILY AS NEEDED (BUG BITES).   CoQ10 200 MG Caps Take 1 tablet by mouth daily. 100 mg every am.   DentaGel 1.1 % Gel dental gel Generic drug: sodium fluoride   estradiol 0.1 MG/GM vaginal cream Commonly known as: ESTRACE VAGINAL 1/2 gm once weekly using applicator, apply blueberry sized amount of cream using tip of finger to urethra twice weekly What changed:   how to take this  when to take this   fish oil-omega-3 fatty acids 1000 MG capsule Take 500 mg by mouth daily. Krill Oil   fluticasone 50 MCG/ACT nasal spray Commonly known as: FLONASE PLACE 2 SPRAYS INTO BOTH NOSTRILS DAILY.   FreeStyle [employer] Misc by Does not apply route.   GARLIC 5056 PO Take 1 tablet by mouth daily. 1 gram   Glucosamine 500 MG Tabs Take 1 tablet by mouth daily.   HumaLOG KwikPen 100 UNIT/ML KwikPen Generic drug: insulin lispro Inject 6 Units into the skin 3 (three) times daily after meals. Pt taking before meals per Bonnie Rowland, sometimes just twice Maximum 11 units per patient   insulin glargine 100 UNIT/ML Solostar Pen Commonly known as: Lantus SoloStar Inject 24 Units into the skin every morning. What changed: how much to take   losartan-hydrochlorothiazide 100-25 MG tablet Commonly known as: HYZAAR Take 1 tablet by mouth in the morning.   Medical Compression Stockings Misc 1 each by Does not apply route daily.   metoprolol succinate 25 MG 24 hr tablet Commonly known as: TOPROL-XL Take 1 tablet (25 mg total) by mouth daily. What changed:   how much to take  when to take this   nystatin cream Commonly known as: MYCOSTATIN Apply 1 application topically 2 (two) times daily.   POTASSIUM & MAGNESIUM ASPARTAT PO Take 1 tablet by mouth daily.   predniSONE 5 MG (48) Tbpk tablet Commonly known as: STERAPRED UNI-PAK 48 TAB Take as directed   PreserVision AREDS 2 Caps Take 1 capsule by mouth 2 (two) times daily.   PROBIOTIC  PO Take 1 Dose by mouth at bedtime.   Prolia 60 MG/ML Sosy injection Generic drug: denosumab   rosuvastatin 10 MG tablet Commonly known as: CRESTOR Take 1 tablet (10 mg total) by mouth 4 (four) times a week.   sulfacetamide 10 % ophthalmic solution Commonly known as: BLEPH-10 Place 1 drop into both eyes 3 (three) times daily as needed.   SYSTANE BALANCE OP Apply 1 drop to eye as needed.   tiZANidine 2 MG tablet Commonly known as: ZANAFLEX TAKE 1 TABLET (2 MG TOTAL) BY MOUTH EVERY 6 (SIX) HOURS AS NEEDED FOR MUSCLE SPASMS.   Vitamin D (Ergocalciferol) 1.25 MG (50000 UNIT) Caps capsule Commonly known as: DRISDOL Take 1 capsule by mouth once  a week.   vitamin E 180 MG (400 UNITS) capsule Generic drug: vitamin E Take 400 Units by mouth daily. Every am       Allergies:  Allergies  Allergen Reactions  . Cyclobenzaprine Hypertension  . Cheese Other (See Comments)    bloating  . Ciprofloxacin Hcl Other (See Comments)    Muscle pain  . Coconut Oil   . Keflex [Cephalexin] Other (See Comments)    Patient prefers not to take this medication due to the side effects    Family History: Family History  Problem Relation Age of Onset  . Diabetes Mother   . Hypertension Mother   . Cancer Mother   . Hypertension Father   . Cancer Father        Prostate  . Breast cancer Maternal Aunt 57  . Colon cancer Son   . Kidney cancer Neg Hx   . Bladder Cancer Neg Hx     Social History:   reports that she has never smoked. She has never used smokeless tobacco. She reports previous alcohol use. She reports that she does not use drugs.  Physical Exam: There were no vitals taken for this visit.  Constitutional:  Alert and oriented, no acute distress, nontoxic appearing HEENT: [place], AT Cardiovascular: No clubbing, cyanosis, or edema Respiratory: Normal respiratory effort, no increased work of breathing Skin: No rashes, bruises or suspicious lesions Neurologic: Grossly intact, no focal  deficits, moving all 4 extremities Psychiatric: Normal mood and affect  Laboratory Data: Results for orders placed or performed in visit on 03/03/20  Microscopic Examination   Urine  Result Value Ref Range   WBC, UA 0-5 0 - 5 /hpf   RBC 0-2 0 - 2 /hpf   Epithelial Cells (non renal) 0-10 0 - 10 /hpf   Bacteria, UA Few None seen/Few  Urinalysis, Complete  Result Value Ref Range   Specific Gravity, UA 1.015 1.005 - 1.030   pH, UA 7.0 5.0 - 7.5   Color, UA Yellow Yellow   Appearance Ur Clear Clear   Leukocytes,UA Negative Negative   Protein,UA Negative Negative/Trace   Glucose, UA Negative Negative   Ketones, UA Negative Negative   RBC, UA Trace (A) Negative   Bilirubin, UA Negative Negative   Urobilinogen, Ur 0.2 0.2 - 1.0 mg/dL   Nitrite, UA Negative Negative   Microscopic Examination See below:    Assessment & Plan:   1. Flank pain with history of urolithiasis 79 year old female with a known history of left renal stone presents with a 1 day history of constant urethral pain radiating to the LLQ.  VSS and UA benign today, will send for culture to confirm.  I recommend KUB for evaluation of possible acute stone episode, however the absence of microscopic hematuria is reassuring for this.  If KUB is negative, I recommend that she proceed to her PCP for evaluation of possible alternative explanations for her symptoms.  She expressed understanding. - Urinalysis, Complete - DG Abd 1 View - CULTURE, URINE COMPREHENSIVE   Return for Will call with results.  Debroah Loop, [name]  [facility] [address], [place], [place] [address] [phone]

## 2020-03-04 ENCOUNTER — Telehealth: Payer: Self-pay | Admitting: Physician Assistant

## 2020-03-04 ENCOUNTER — Ambulatory Visit
Admission: RE | Admit: 2020-03-04 | Discharge: 2020-03-04 | Disposition: A | Discharge status: Home / Self Care | Payer: Medicare Other | Source: Ambulatory Visit | Attending: Physician Assistant | Admitting: Physician Assistant

## 2020-03-04 DIAGNOSIS — Z87442 Personal history of urinary calculi: Secondary | ICD-10-CM

## 2020-03-04 DIAGNOSIS — K8020 Calculus of gallbladder without cholecystitis without obstruction: Secondary | ICD-10-CM | POA: Diagnosis not present

## 2020-03-04 DIAGNOSIS — R109 Unspecified abdominal pain: Secondary | ICD-10-CM | POA: Diagnosis not present

## 2020-03-04 DIAGNOSIS — K5730 Diverticulosis of large intestine without perforation or abscess without bleeding: Secondary | ICD-10-CM | POA: Diagnosis not present

## 2020-03-04 DIAGNOSIS — K449 Diaphragmatic hernia without obstruction or gangrene: Secondary | ICD-10-CM | POA: Diagnosis not present

## 2020-03-04 DIAGNOSIS — D3500 Benign neoplasm of unspecified adrenal gland: Secondary | ICD-10-CM | POA: Diagnosis not present

## 2020-03-04 NOTE — Telephone Encounter (Signed)
I just spoke with the patient via telephone.  I explained that her CT scan showed no acute abnormalities to explain her pain.  To that end, it appears that her previously known left renal stone is more consistent with a parenchymal calcification and is not likely to cause acute stone episodes in the future.  No other visualized urolithiasis.  I will continue to follow her urine cultures and treat as indicated.  Otherwise, I counseled her to follow-up with her PCP for further evaluation if her pain persists.  She expressed understanding.

## 2020-03-04 NOTE — Telephone Encounter (Signed)
I just spoke with the patient via telephone. I explained that her KUB was negative for urolithiasis despite her known left renal stone. Based on this finding, I would like to obtain a STAT CT stone study today to evaluate her for an acute stone episode. She is in agreement with this plan.  Today she reports improvement of her pain. It is now rated 3/10 in severity, stable in location, and variable in intensity.

## 2020-03-07 LAB — CULTURE, URINE COMPREHENSIVE

## 2020-03-10 DIAGNOSIS — M9903 Segmental and somatic dysfunction of lumbar region: Secondary | ICD-10-CM | POA: Diagnosis not present

## 2020-03-10 DIAGNOSIS — M5033 Other cervical disc degeneration, cervicothoracic region: Secondary | ICD-10-CM | POA: Diagnosis not present

## 2020-03-10 DIAGNOSIS — M5416 Radiculopathy, lumbar region: Secondary | ICD-10-CM | POA: Diagnosis not present

## 2020-03-10 DIAGNOSIS — M9901 Segmental and somatic dysfunction of cervical region: Secondary | ICD-10-CM | POA: Diagnosis not present

## 2020-03-16 ENCOUNTER — Other Ambulatory Visit: Payer: Self-pay | Admitting: Family Medicine

## 2020-03-16 NOTE — Telephone Encounter (Signed)
Requested medication (s) are due for refill today: Due 03/24/20  Requested medication (s) are on the active medication list: yes  Last refill: 02/22/20  #120  0 refills  Future visit scheduled  yes 04/20/20  Notes to clinic: not delegated  Requested Prescriptions  Pending Prescriptions Disp Refills   tiZANidine (ZANAFLEX) 2 MG tablet [Pharmacy Med Name: TIZANIDINE HCL 2 MG TABLET] 120 tablet 0    Sig: TAKE 1 TABLET (2 MG TOTAL) BY MOUTH EVERY 6 (SIX) HOURS AS NEEDED FOR MUSCLE SPASMS.      Not Delegated - Cardiovascular:  Alpha-2 Agonists - tizanidine Failed - 03/16/2020 10:34 AM      Failed - This refill cannot be delegated      Passed - Valid encounter within last 6 months    Recent Outpatient Visits           4 weeks ago [facility] [place], [place], MD   2 months ago Atherosclerosis of renal artery [facility])   [facility] Steele Sizer, MD   3 months ago Acute left-sided low back pain without sciatica   [place], MD   8 months ago 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[facility])   [facility] Steele Sizer, MD   1 year ago Diabetes mellitus type 2 in obese [facility])   [facility] Steele Sizer, MD       Future Appointments             In 1 month  [place], [place]   In 2 months McGowan, Gordan Payment [place]   In 3 months Steele Sizer, MD [facility], [facility]

## 2020-03-17 DIAGNOSIS — M5416 Radiculopathy, lumbar region: Secondary | ICD-10-CM | POA: Diagnosis not present

## 2020-03-17 DIAGNOSIS — M9903 Segmental and somatic dysfunction of lumbar region: Secondary | ICD-10-CM | POA: Diagnosis not present

## 2020-03-17 DIAGNOSIS — M9901 Segmental and somatic dysfunction of cervical region: Secondary | ICD-10-CM | POA: Diagnosis not present

## 2020-03-17 DIAGNOSIS — M5033 Other cervical disc degeneration, cervicothoracic region: Secondary | ICD-10-CM | POA: Diagnosis not present

## 2020-03-22 DIAGNOSIS — M810 Age-related osteoporosis without current pathological fracture: Secondary | ICD-10-CM | POA: Diagnosis not present

## 2020-03-30 ENCOUNTER — Other Ambulatory Visit: Payer: Self-pay

## 2020-03-30 ENCOUNTER — Ambulatory Visit: Payer: Medicare Other | Admitting: Pharmacist

## 2020-03-30 DIAGNOSIS — I10 Essential (primary) hypertension: Secondary | ICD-10-CM

## 2020-03-30 DIAGNOSIS — M5136 Other intervertebral disc degeneration, lumbar region: Secondary | ICD-10-CM

## 2020-03-30 NOTE — Chronic Care Management (AMB) (Signed)
Chronic Care Management Pharmacy  Name: FRAN NEISWONGER  MRN: [id number] DOB: 01-Feb-1941  Chief Complaint/ HPI  Bonnie Rowland,  79 y.o. , female presents for their Follow-Up CCM visit with the clinical pharmacist via telephone due to COVID-19 Pandemic.  PCP : Steele Sizer, MD  Their chronic conditions include: HTN, HLD, DM  Office Visits: 8/17 poison ivy, Sowles, SteraPred ud  Consult Visit: 9/2 flank pain, Vaillancort, kidney stone 7/29 DM, O'Connell, BP 110/62 P 68 Wt 198 BMI 34.0, A1c 6.7%, Bydureon 2mg  qwk, Lantus 22u qhs, Humalog 6-10u tid ac, lost 5#, Prolia inj this month  Medications: Outpatient Encounter Medications as of 03/30/2020  Medication Sig Note  . acetaminophen (TYLENOL) 500 MG tablet Take 500 mg by mouth every 6 (six) hours as needed. Takes 1300mg  at bedtime with Zanaflex   . ALFALFA PO Take 3 tablets by mouth every morning.   Marland Kitchen aspirin 81 MG EC tablet Take 81 mg by mouth at bedtime.  02/22/2015: Holding due to surgery.  . B Complex-C (B-COMPLEX WITH VITAMIN C) tablet Take 1 tablet by mouth at bedtime.    . BD PEN NEEDLE NANO U/F 32G X 4 MM MISC USE 4 TIMES DAILY (HUMALOG 3 TIMES DAILY AND LANTUS ONCE DAILY) OFFICE NOTIFIED 08/06/17   . BLACK ELDERBERRY,BERRY-FLOWER, PO Take by mouth.   Marland Kitchen BYDUREON 2 MG PEN INJECT 2 MG SUBCUTANEOUSLY ONCE A WEEK   . calcium carbonate (OS-CAL) 600 MG TABS tablet Take 1,000 mg by mouth daily.    . cholecalciferol (VITAMIN D) 1000 UNITS tablet Take 1,000 Units by mouth daily.    . clobetasol ointment (TEMOVATE) 1.74 % APPLY 1 APPLICATION TOPICALLY 2 (TWO) TIMES DAILY AS NEEDED (BUG BITES).   . Coenzyme Q10 (COQ10) 200 MG CAPS Take 1 tablet by mouth daily. 100 mg every am.   . Continuous Blood Gluc Sensor ([place]) MISC by Does not apply route.   Regino Schultze Bandages & Supports (MEDICAL COMPRESSION STOCKINGS) MISC 1 each by Does not apply route daily.   Marland Kitchen estradiol (ESTRACE VAGINAL) 0.1 MG/GM vaginal  cream 1/2 gm once weekly using applicator, apply blueberry sized amount of cream using tip of finger to urethra twice weekly (Patient taking differently: Place vaginally 2 (two) times a week. 1/2 gm once weekly using applicator, apply blueberry sized amount of cream using tip of finger to urethra twice weekly)   . Eyelid Cleansers (AVENOVA) 0.01 % SOLN See admin instructions. 01/16/2016: Received from: External Pharmacy  . fish oil-omega-3 fatty acids 1000 MG capsule Take 500 mg by mouth daily. Krill Oil   . fluticasone (FLONASE) 50 MCG/ACT nasal spray PLACE 2 SPRAYS INTO BOTH NOSTRILS DAILY.   Marland Kitchen GARLIC 9449 PO Take 1 tablet by mouth daily. 1 gram   . Glucosamine 500 MG TABS Take 1 tablet by mouth daily.    Marland Kitchen HUMALOG KWIKPEN 100 UNIT/ML KiwkPen Inject 6 Units into the skin 3 (three) times daily after meals. Pt taking before meals per Dr. Honor Junes, sometimes just twice Maximum 11 units per patient   . Insulin Glargine (LANTUS SOLOSTAR) 100 UNIT/ML Solostar Pen Inject 24 Units into the skin every morning. (Patient taking differently: Inject 22 Units into the skin every morning. )   . losartan-hydrochlorothiazide (HYZAAR) 100-25 MG tablet Take 1 tablet by mouth in the morning.   . Mag Aspart-Potassium Aspart (POTASSIUM & MAGNESIUM ASPARTAT PO) Take 1 tablet by mouth daily.   . metoprolol succinate (TOPROL-XL) 25 MG 24 hr tablet Take  1 tablet (25 mg total) by mouth daily. (Patient taking differently: Take 12.5 mg by mouth 2 (two) times daily. )   . Multiple Vitamins-Minerals (PRESERVISION AREDS 2) CAPS Take 1 capsule by mouth 2 (two) times daily.   Marland Kitchen nystatin cream (MYCOSTATIN) Apply 1 application topically 2 (two) times daily.   . predniSONE (STERAPRED UNI-PAK 48 TAB) 5 MG (48) TBPK tablet Take as directed   . Probiotic Product (PROBIOTIC PO) Take 1 Dose by mouth at bedtime.   Marland Kitchen PROLIA 60 MG/ML SOSY injection    . Propylene Glycol (SYSTANE BALANCE OP) Apply 1 drop to eye as needed.   . rosuvastatin  (CRESTOR) 10 MG tablet Take 1 tablet (10 mg total) by mouth 4 (four) times a week.   . sodium fluoride (DENTAGEL) 1.1 % GEL dental gel  12/12/2015: Received from: [facility]  . sulfacetamide (BLEPH-10) 10 % ophthalmic solution Place 1 drop into both eyes 3 (three) times daily as needed.   Marland Kitchen tiZANidine (ZANAFLEX) 2 MG tablet TAKE 1 TABLET (2 MG TOTAL) BY MOUTH EVERY 6 (SIX) HOURS AS NEEDED FOR MUSCLE SPASMS.   . Vitamin D, Ergocalciferol, (DRISDOL) 1.25 MG (50000 UT) CAPS capsule Take 1 capsule by mouth once a week.   . vitamin E (VITAMIN E) 400 UNIT capsule Take 400 Units by mouth daily. Every am    No facility-administered encounter medications on file as of 03/30/2020.      Financial Resource Strain: Low Risk   . Difficulty of Paying Living Expenses: Not very hard   Current Diagnosis/Assessment:  Goals Addressed            This Visit's Progress   . Chronic Care Management       CARE PLAN ENTRY (see longitudinal plan of care for additional care plan information)  Current Barriers:  . Chronic Disease Management support, education, and care coordination needs related to Hyperlipidemia, Diabetes, and Osteoporosis .   Hyperlipidemia Lab Results  Component Value Date/Time   LDLCALC 57 11/20/2018 12:00 AM   LDLCALC 82 04/11/2018 10:32 AM   . Pharmacist Clinical Goal(s): o Over the next 90 days, patient will work with PharmD and providers to maintain LDL goal < 70 . Current regimen:  o Crestor 10mg  daily . Interventions: o None . Patient self care activities - Over the next 90 days, patient will: o Continue rosuvastatin 10mg  daily  Diabetes Lab Results  Component Value Date/Time   HGBA1C 6.5 11/20/2018 12:00 AM   HGBA1C 7.2 03/13/2018 12:00 AM   . Pharmacist Clinical Goal(s): o Over the next 90 days, patient will work with PharmD and providers to maintain A1c goal <7% . Current regimen:  o Lantus inject 22 units daily o Humalog inject 6 - 10 units  three times daily with meals o Bydureon 2mg  inject weekly . Interventions: o None . Patient self care activities - Over the next 90 days, patient will: o Check blood sugar once daily, document, and provide at future appointments o No need to check blood sugar using both continuous sensor and traditional meter o Contact provider with any episodes of hypoglycemia  Chronic Pain . Pharmacist Clinical Goal(s) o Over the next 90 days, patient will work with PharmD and providers to decreasing pain level . Current regimen:  o Acetaminophen 1300mg  at bedtime o Glucosamine 500mg  once daily o Zanaflex 2mg  at bedtime plus 2mg  nightly as needed . Interventions: o None . Patient self care activities - Over the next 90 days, patient will:  o Continue regular walk  Medication management . Pharmacist Clinical Goal(s): o Over the next 90 days, patient will work with PharmD and providers to maintain optimal medication adherence . Current pharmacy: [facility] . Interventions o Comprehensive medication review performed. o Continue current medication management strategy o Increase calcium to twice daily . Patient self care activities - Over the next 90 days, patient will: o Focus on medication adherence by maintaining current organizational practices o Take medications as prescribed o Report any questions or concerns to PharmD and/or provider(s)  Initial goal documentation       Hypertension   BP goal is:  <130/80  Office blood pressures are  BP Readings from Last 3 Encounters:  01/07/20 130/70  11/25/19 128/70  09/16/19 (!) 150/62   Patient checks BP at home infrequently Patient home BP readings are ranging: NA  Patient has failed these meds in the past: NA Patient is currently controlled on the following medications:  . Losartan-HCTZ 100-25mg  daily . Metoprolol succinate 12.5mg  twice daily  We discussed: Not checking BP at home At goal during visits Denies  hypotension  Plan  Continue current medications   Chronic Pain   Patient has failed these meds in past: NA Patient is currently controlled on the following medications:  . Zanaflex 2mg  qhs and 2mg  prn . Tylenol 1300mg  at bedtime . Glucosamine daily  We discussed:   Has hernia type pain  Plan  Continue current medications  Medication Management   [facility] Uses pill box? Yes Pt endorses 100% compliance  We discussed: Current pharmacy is preferred with insurance plan and patient is satisfied with pharmacy services Zanaflex 2mg  qhs, can do 4mg  has extra Takes 1300mg  APAP qhs Glucosamine just once daily  Plan  Checking BP? No  Follow up: 4 month phone visit  Milus Height, PharmD, [place], [facility] [phone]

## 2020-03-31 NOTE — Patient Instructions (Addendum)
Visit Information  Goals Addressed            This Visit's Progress   . Chronic Care Management       CARE PLAN ENTRY (see longitudinal plan of care for additional care plan information)  Current Barriers:  . Chronic Disease Management support, education, and care coordination needs related to Hyperlipidemia, Diabetes, and Osteoporosis .   Hyperlipidemia Lab Results  Component Value Date/Time   LDLCALC 57 11/20/2018 12:00 AM   LDLCALC 82 04/11/2018 10:32 AM   . Pharmacist Clinical Goal(s): o Over the next 90 days, patient will work with PharmD and providers to maintain LDL goal < 70 . Current regimen:  o Crestor 10mg  daily . Interventions: o None . Patient self care activities - Over the next 90 days, patient will: o Continue rosuvastatin 10mg  daily  Diabetes Lab Results  Component Value Date/Time   HGBA1C 6.5 11/20/2018 12:00 AM   HGBA1C 7.2 03/13/2018 12:00 AM   . Pharmacist Clinical Goal(s): o Over the next 90 days, patient will work with PharmD and providers to maintain A1c goal <7% . Current regimen:  o Lantus inject 22 units daily o Humalog inject 6 - 10 units three times daily with meals o Bydureon 2mg  inject weekly . Interventions: o None . Patient self care activities - Over the next 90 days, patient will: o Check blood sugar once daily, document, and provide at future appointments o No need to check blood sugar using both continuous sensor and traditional meter o Contact provider with any episodes of hypoglycemia  Chronic Pain . Pharmacist Clinical Goal(s) o Over the next 90 days, patient will work with PharmD and providers to decreasing pain level . Current regimen:  o Acetaminophen 1300mg  at bedtime o Glucosamine 500mg  once daily o Zanaflex 2mg  at bedtime plus 2mg  nightly as needed . Interventions: o None . Patient self care activities - Over the next 90 days, patient will: o Continue regular walk  Medication management . Pharmacist Clinical  Goal(s): o Over the next 90 days, patient will work with PharmD and providers to maintain optimal medication adherence . [facility] . Interventions o Comprehensive medication review performed. o Continue current medication management strategy o Increase calcium to twice daily . Patient self care activities - Over the next 90 days, patient will: o Focus on medication adherence by maintaining current organizational practices o Take medications as prescribed o Report any questions or concerns to PharmD and/or provider(s)  Initial goal documentation        Print copy of patient instructions provided.   Telephone follow up appointment with pharmacy team member scheduled for: 4 months  Milus Height, PharmD, [place], [facility] [phone]   Degenerative Disk Disease  Degenerative disk disease is a condition caused by changes that occur in the spinal disks as a person ages. Spinal disks are soft and compressible disks located between the bones of your spine (vertebrae). These disks act like shock absorbers. Degenerative disk disease can affect the whole spine. However, the neck and lower back are most often affected. Many changes can occur in the spinal disks with aging, such as:  The spinal disks may dry and shrink.  Small tears may occur in the tough, outer covering of the disk (annulus).  The disk space may become smaller due to loss of water.  Abnormal growths in the bone (spurs) may occur. This can put pressure on the nerve roots exiting the spinal canal, causing pain.  The spinal canal may become narrowed. What are the causes? This condition may be caused by:  Normal degeneration with age.  Injuries.  Certain activities and sports that cause damage. What increases the risk? The following factors may make you more likely to develop this condition:  Being overweight.  Having a family history of degenerative disk  disease.  Smoking.  Sudden injury.  Doing work that requires heavy lifting. What are the signs or symptoms? Symptoms of this condition include:  Pain that varies in intensity. Some people have no pain, while others have severe pain. The location of the pain depends on the part of your backbone that is affected. You may have: ? Pain in your neck or arm if a disk in your neck area is affected. ? Pain in your back, buttocks, or legs if a disk in your lower back is affected.  Pain that becomes worse while bending or reaching up, or with twisting movements.  Pain that may start gradually and then get worse as time passes. It may also start after a major or minor injury.  Numbness or tingling in the arms or legs. How is this diagnosed? This condition may be diagnosed based on:  Your symptoms and medical history.  A physical exam.  Imaging tests, including: ? An X-ray of the spine. ? MRI. How is this treated? This condition may be treated with:  Medicines.  Rehabilitation exercises. These activities aim to strengthen muscles in your back and abdomen to better support your spine. If treatments do not help to relieve your symptoms or you have severe pain, you may need surgery. Follow these instructions at home: Medicines  Take over-the-counter and prescription medicines only as told by your health care provider.  Do not drive or use heavy machinery while taking prescription pain medicine.  If you are taking prescription pain medicine, take actions to prevent or treat constipation. Your health care provider may recommend that you: ? Drink enough fluid to keep your urine pale yellow. ? Eat foods that are high in fiber, such as fresh fruits and vegetables, whole grains, and beans. ? Limit foods that are high in fat and processed sugars, such as fried or sweet foods. ? Take an over-the-counter or prescription medicine for constipation. Activity  Rest as told by your health care  provider.  Ask your health care provider what activities are safe for you. Return to your normal activities as directed.  Avoid sitting for a long time without moving. Get up to take short walks every 1-2 hours. This is important to improve blood flow and breathing. Ask for help if you feel weak or unsteady.  Perform relaxation exercises as told by your health care provider.  Maintain good posture.  Do not lift anything that is heavier than 10 lb (4.5 kg), or the limit that you are told, until your health care provider says that it is safe.  Follow proper lifting and walking techniques as told by your health care provider. Managing pain, stiffness, and swelling   If directed, put ice on the painful area. Icing can help to relieve pain. ? Put ice in a plastic bag. ? Place a towel between your skin and the bag. ? Leave the ice on for 20 minutes, 2-3 times a day.  If directed, apply heat to the painful area as often as told by your health care provider. Heat can reduce the stiffness of your muscles. Use the heat source that your health care provider recommends, such  as a moist heat pack or a heating pad. ? Place a towel between your skin and the heat source. ? Leave the heat on for 20-30 minutes. ? Remove the heat if your skin turns bright red. This is especially important if you are unable to feel pain, heat, or cold. You may have a greater risk of getting burned. General instructions  Change your sitting, standing, and sleeping habits as told by your health care provider.  Avoid sitting in the same position for long periods of time. Change positions frequently.  Lose weight or maintain a healthy weight as told by your health care provider.  Do not use any products that contain nicotine or tobacco, such as cigarettes and e-cigarettes. If you need help quitting, ask your health care provider.  Wear supportive footwear.  Keep all follow-up visits as told by your health care provider.  This is important. This may include visits for physical therapy. Contact a health care provider if you:  Have pain that does not go away within 1-4 weeks.  Lose your appetite.  Lose weight without trying. Get help right away if you:  Have severe pain.  Notice weakness in your arms, hands, or legs.  Begin to lose control of your bladder or bowel movements.  Have fevers or night sweats. Summary  Degenerative disk disease is a condition caused by changes that occur in the spinal disks as a person ages.  Degenerative disk disease can affect the whole spine. However, the neck and lower back are most often affected.  Take over-the-counter and prescription medicines only as told by your health care provider. This information is not intended to replace advice given to you by your health care provider. Make sure you discuss any questions you have with your health care provider. Document Revised: 06/13/2017 Document Reviewed: 06/13/2017 Elsevier Patient Education  2020 [employer].

## 2020-04-02 ENCOUNTER — Other Ambulatory Visit: Payer: Self-pay | Admitting: Family Medicine

## 2020-04-02 DIAGNOSIS — E785 Hyperlipidemia, unspecified: Secondary | ICD-10-CM

## 2020-04-02 DIAGNOSIS — I701 Atherosclerosis of renal artery: Secondary | ICD-10-CM

## 2020-04-02 DIAGNOSIS — E1169 Type 2 diabetes mellitus with other specified complication: Secondary | ICD-10-CM

## 2020-04-07 ENCOUNTER — Encounter: Payer: Self-pay | Admitting: Podiatry

## 2020-04-07 ENCOUNTER — Ambulatory Visit (INDEPENDENT_AMBULATORY_CARE_PROVIDER_SITE_OTHER): Payer: Medicare Other | Admitting: Podiatry

## 2020-04-07 ENCOUNTER — Other Ambulatory Visit: Payer: Self-pay

## 2020-04-07 DIAGNOSIS — E1165 Type 2 diabetes mellitus with hyperglycemia: Secondary | ICD-10-CM

## 2020-04-07 DIAGNOSIS — IMO0002 Reserved for concepts with insufficient information to code with codable children: Secondary | ICD-10-CM

## 2020-04-07 DIAGNOSIS — E113299 Type 2 diabetes mellitus with mild nonproliferative diabetic retinopathy without macular edema, unspecified eye: Secondary | ICD-10-CM | POA: Diagnosis not present

## 2020-04-07 DIAGNOSIS — L84 Corns and callosities: Secondary | ICD-10-CM | POA: Diagnosis not present

## 2020-04-07 NOTE — Addendum Note (Signed)
Addended by: [name] on: 04/07/2020 08:45 AM   Modules accepted: Level of Service

## 2020-04-07 NOTE — Progress Notes (Addendum)
This patient returns to my office for at risk foot care.  This patient requires this care by a professional since this patient will be at risk due to having  Diabetes.  Patient has painful corn on her third toe left foot.This patient is unable to cut corn herself since the patient cannot reach her nails.These nails are painful walking and wearing shoes.  This patient presents for at risk foot care today. Patient is also here for diabetic foot exam.  General Appearance  Alert, conversant and in no acute stress.  Vascular  Dorsalis pedis pulses  are palpable  bilaterally. Posterior tibial pulses are weakly palpable  B/L. Capillary return is within normal limits  bilaterally. Temperature is within normal limits  Bilaterally. Swelling ankles/lower legs.  Superficial veins both feet./  Neurologic  Senn-Weinstein monofilament wire test within normal limits  bilaterally. Muscle power within normal limits bilaterally.  Nails Normal nails with no evidence of fungus.. No evidence of bacterial infection or drainage bilaterally.  Orthopedic  No limitations of motion  feet .  No crepitus or effusions noted.  Contracted digits B/L with deviated fourth toes  B/L.  Skin  normotropic skin with no porokeratosis noted bilaterally.  No signs of infections or ulcers noted.     Clavi third toe left foot.  Consent was obtained for treatment procedures. Debride clavi  Third toe left followed by dremel tool usage.  Padding dispensed.  Patient inquires about her orthoses therefore I told her to contact  [facility]..  Diabetic foot exam performed.   Return office visit    6 months                 Told patient to return for periodic foot care and evaluation due to potential at risk complications.   [name] DPM

## 2020-04-11 DIAGNOSIS — L089 Local infection of the skin and subcutaneous tissue, unspecified: Secondary | ICD-10-CM | POA: Diagnosis not present

## 2020-04-11 DIAGNOSIS — Z8639 Personal history of other endocrine, nutritional and metabolic disease: Secondary | ICD-10-CM | POA: Diagnosis not present

## 2020-04-11 DIAGNOSIS — L723 Sebaceous cyst: Secondary | ICD-10-CM | POA: Diagnosis not present

## 2020-04-13 ENCOUNTER — Other Ambulatory Visit: Payer: Self-pay | Admitting: Family Medicine

## 2020-04-18 DIAGNOSIS — L723 Sebaceous cyst: Secondary | ICD-10-CM | POA: Diagnosis not present

## 2020-04-18 DIAGNOSIS — L089 Local infection of the skin and subcutaneous tissue, unspecified: Secondary | ICD-10-CM | POA: Diagnosis not present

## 2020-04-19 ENCOUNTER — Ambulatory Visit (INDEPENDENT_AMBULATORY_CARE_PROVIDER_SITE_OTHER): Payer: Medicare Other

## 2020-04-19 ENCOUNTER — Other Ambulatory Visit: Payer: Self-pay

## 2020-04-19 VITALS — BP 138/68 | HR 87 | Temp 97.7°F | Resp 16 | Ht 63.0 in | Wt 198.8 lb

## 2020-04-19 DIAGNOSIS — Z0000 Encounter for general adult medical examination without abnormal findings: Secondary | ICD-10-CM

## 2020-04-19 NOTE — Progress Notes (Signed)
Subjective:   Bonnie Rowland is a 79 y.o. female who presents for Medicare Annual (Subsequent) preventive examination.  Review of Systems     Cardiac Risk Factors include: advanced age (>62mn, >>18women);diabetes mellitus;dyslipidemia;hypertension;obesity (BMI >30kg/m2)     Objective:    Today's Vitals   04/19/20 0917  BP: 138/68  Pulse: 87  Resp: 16  Temp: 97.7 F (36.5 C)  TempSrc: Oral  SpO2: 97%  Weight: 198 lb 12.8 oz (90.2 kg)  Height: _0  (1.6 m)   Body mass index is 35.22 kg/m.  Advanced Directives 04/19/2020 04/17/2019 04/11/2018 04/08/2017 02/18/2017 01/10/2017 01/04/2017  Does Patient Have a Medical Advance Directive? _1  No No  Type of AParamedicof ALeeLiving will HLeachLiving will HFairmontLiving will HIcehouse CanyonLiving will HBurkeLiving will - -  Does patient want to make changes to medical advance directive? - - No - Patient declined - - - -  Copy of H[place]yline-[place]ip[place] Chart? Yes - validated most recent copy scanned in chart (See row information) Yes - validated most recent copy scanned in chart (See row information) Yes Yes Yes - -  Would patient like information on creating a medical advance directive? - - - - - - -    Current Medications (verified) Outpatient Encounter Medications as of 04/19/2020  Medication Sig  . acetaminophen (TYLENOL) 500 MG tablet Take 500 mg by mouth every 6 (six) hours as needed. Takes 13047mat bedtime with Zanaflex  . ALFALFA PO Take 3 tablets by mouth every morning.  . Marland Kitchenspirin 81 MG EC tablet Take 81 mg by mouth at bedtime.   . B Complex-C (B-COMPLEX WITH VITAMIN C) tablet Take 1 tablet by mouth at bedtime.   . BD PEN NEEDLE NANO U/F 32G X 4 MM MISC USE 4 TIMES DAILY (HUMALOG 3 TIMES DAILY AND LANTUS ONCE DAILY) OFFICE NOTIFIED 08/06/17  . BLACK ELDERBERRY,BERRY-FLOWER, PO Take by mouth.   . calcium carbonate (OS-CAL) 600 MG TABS tablet Take 1,000 mg by mouth daily.   . cholecalciferol (VITAMIN D) 1000 UNITS tablet Take 1,000 Units by mouth daily.   . clobetasol ointment (TEMOVATE) 0.9.62 APPLY 1 APPLICATION TOPICALLY 2 (TWO) TIMES DAILY AS NEEDED (BUG BITES).  . Coenzyme Q10 (COQ10) 200 MG CAPS Take 1 tablet by mouth daily. 100 mg every am.  . Continuous Blood Gluc Sensor (FRFirestoneMISC by Does not apply route.  . doxycycline (VIBRA-TABS) 100 MG tablet Take 100 mg by mouth 2 (two) times daily.  . Regino Schultzeandages & Supports (MEDICAL COMPRESSION STOCKINGS) MISC 1 each by Does not apply route daily.  . Marland Kitchenstradiol (ESTRACE VAGINAL) 0.1 MG/GM vaginal cream 1/2 gm once weekly using applicator, apply blueberry sized amount of cream using tip of finger to urethra twice weekly (Patient taking differently: Place vaginally 2 (two) times a week. 1/2 gm once weekly using applicator, apply blueberry sized amount of cream using tip of finger to urethra twice weekly)  . Exenatide ER (BYDUREON BCISE) 2 MG/0.85ML AUIJ Inject into the skin.  . Eyelid Cleansers (AVENOVA) 0.01 % SOLN See admin instructions.  . fish oil-omega-3 fatty acids 1000 MG capsule Take 500 mg by mouth daily. Krill Oil  . fluticasone (FLONASE) 50 MCG/ACT nasal spray PLACE 2 SPRAYS INTO BOTH NOSTRILS DAILY.  . Marland KitchenARLIC 159528O Take 1 tablet by mouth daily. 1 gram  . Glucosamine 500 MG  TABS Take 1 tablet by mouth daily.   Marland Kitchen HUMALOG KWIKPEN 100 UNIT/ML KiwkPen Inject 6 Units into the skin 3 (three) times daily after meals. Pt taking before meals per Dr. Honor Junes, sometimes just twice Maximum 11 units per patient  . Insulin Glargine (LANTUS SOLOSTAR) 100 UNIT/ML Solostar Pen Inject 24 Units into the skin every morning. (Patient taking differently: Inject 22 Units into the skin every morning. )  . losartan-hydrochlorothiazide (HYZAAR) 100-25 MG tablet Take 1 tablet by mouth in the morning.  . Mag Aspart-Potassium  Aspart (POTASSIUM & MAGNESIUM ASPARTAT PO) Take 1 tablet by mouth daily.  . metoprolol succinate (TOPROL-XL) 25 MG 24 hr tablet Take 1 tablet (25 mg total) by mouth daily. (Patient taking differently: Take 12.5 mg by mouth 2 (two) times daily. )  . Multiple Vitamins-Minerals (PRESERVISION AREDS 2) CAPS Take 1 capsule by mouth 2 (two) times daily.  Marland Kitchen nystatin cream (MYCOSTATIN) Apply 1 application topically 2 (two) times daily.  . Probiotic Product (PROBIOTIC PO) Take 1 Dose by mouth at bedtime.  Marland Kitchen PROLIA 60 MG/ML SOSY injection   . Propylene Glycol (SYSTANE BALANCE OP) Apply 1 drop to eye as needed.  . rosuvastatin (CRESTOR) 10 MG tablet Take 1 tablet (10 mg total) by mouth 4 (four) times a week.  . sodium fluoride (DENTAGEL) 1.1 % GEL dental gel   . sulfacetamide (BLEPH-10) 10 % ophthalmic solution Place 1 drop into both eyes 3 (three) times daily as needed.  Marland Kitchen tiZANidine (ZANAFLEX) 2 MG tablet TAKE 1 TABLET (2 MG TOTAL) BY MOUTH EVERY 6 (SIX) HOURS AS NEEDED FOR MUSCLE SPASMS.  . Vitamin D, Ergocalciferol, (DRISDOL) 1.25 MG (50000 UT) CAPS capsule Take 1 capsule by mouth once a week.  . vitamin E (VITAMIN E) 400 UNIT capsule Take 400 Units by mouth daily. Every am  . [DISCONTINUED] predniSONE (STERAPRED UNI-PAK 48 TAB) 5 MG (48) TBPK tablet Take as directed   No facility-administered encounter medications on file as of 04/19/2020.    Allergies (verified) Cyclobenzaprine, Cheese, Ciprofloxacin hcl, Coconut oil, and Keflex [cephalexin]   History: Past Medical History:  Diagnosis Date  . Acute cystitis   . Allergic rhinitis, cause unspecified   . Arthritis 2015  . 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 Bronchitis, not specified as acute or chronic   . 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 Cataract 2003  . Cellulitis and abscess of leg, except foot   . Conjunctivitis unspecified   .  Dermatophytosis of nail   . Diabetes mellitus    type II  . Esophageal reflux   . Hyperlipidemia   . Hypertension   . Osteoporosis 2016  . 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 Renal artery stenosis ([place])   . Sprain of lumbar region   . Thoracic or lumbosacral neuritis or radiculitis, unspecified   . Urinary tract infection, site not specified    Past Surgical History:  Procedure Laterality Date  . APPENDECTOMY    . CARDIAC CATHETERIZATION    . cataract surgery    . COLONOSCOPY    . COLONOSCOPY WITH PROPOFOL N/A 05/09/2016   Procedure: COLONOSCOPY WITH PROPOFOL;  Surgeon: Robert Bellow, MD;  Location: [facility] ENDOSCOPY;  Service: Endoscopy;  Laterality: N/A;  . DIAGNOSTIC MAMMOGRAM    . EYE SURGERY Bilateral    Cataract Extraction  . JOINT REPLACEMENT  2016  .  KNEE ARTHROPLASTY Right 03/30/2015   Procedure: COMPUTER ASSISTED TOTAL KNEE ARTHROPLASTY;  Surgeon: Dereck Leep, MD;  Location: [facility] ORS;  Service: Orthopedics;  Laterality: Right;  . KNEE ARTHROSCOPY Right   . KNEE CLOSED REDUCTION Right 05/23/2015   Procedure: CLOSED MANIPULATION KNEE;  Surgeon: Dereck Leep, MD;  Location: [facility] ORS;  Service: Orthopedics;  Laterality: Right;  . TONSILLECTOMY     Family History  Problem Relation Age of Onset  . Diabetes Mother   . Hypertension Mother   . Cancer Mother   . Kidney disease Mother   . Hypertension Father   . Cancer Father        Prostate  . Breast cancer Maternal Aunt 73  . Colon cancer Son   . Kidney cancer Neg Hx   . Bladder Cancer Neg Hx    Social History   Socioeconomic History  . Marital status: Married    Spouse name: Emergency planning/management officer  . Number of children: 1  . Years of education: Not on file  . Highest education level: Master's degree (e.g., MA, MS, MEng, MEd, MSW, MBA)  Occupational History  . Occupation: Retired  Tobacco Use  . Smoking status: Never Smoker  . Smokeless tobacco: Never Used  Vaping Use  . Vaping Use: Never used  Substance and Sexual  Activity  . Alcohol use: Not Currently    Alcohol/week: 0.0 standard drinks  . Drug use: No  . Sexual activity: Yes    Partners: Male    Birth control/protection: Post-menopausal  Other Topics Concern  . Not on file  Social History Narrative   She is married , only has one son and he was diagnosed with colon cancer at age 56.    Social Determinants of Health   Financial Resource Strain: Low Risk   . Difficulty of Paying Living Expenses: Not very hard  Food Insecurity: No Food Insecurity  . Worried About Charity fundraiser in the Last Year: Never true  . Ran Out of Food in the Last Year: Never true  Transportation Needs: No Transportation Needs  . Lack of Transportation (Medical): No  . Lack of Transportation (Non-Medical): No  Physical Activity: Sufficiently Active  . Days of Exercise per Week: 7 days  . Minutes of Exercise per Session: 30 min  Stress: No Stress Concern Present  . Feeling of Stress : Not at all  Social Connections: Socially Integrated  . Frequency of Communication with Friends and Family: More than three times a week  . Frequency of Social Gatherings with Friends and Family: More than three times a week  . Attends Religious Services: More than 4 times per year  . Active Member of Clubs or Organizations: Yes  . Attends Archivist Meetings: More than 4 times per year  . Marital Status: Married    Tobacco Counseling Counseling given: Not Answered   Clinical Intake:  Pre-visit preparation completed: Yes  Pain : No/denies pain     Nutritional Risks: Nausea/ vomitting/ diarrhea (diarrhea related to bydureon) Diabetes: Yes CBG done?: No Did pt. bring in CBG monitor from home?: No  How often do you need to have someone help you when you read instructions, pamphlets, or other written materials from your doctor or pharmacy?: 1 - Never  Nutrition Risk Assessment:  Has the patient had any N/V/D within the last 2 months?  No  Does the patient  have any non-healing wounds?  No  Has the patient had any unintentional weight loss or  weight gain?  No   Diabetes:  Is the patient diabetic?  Yes  If diabetic, was a CBG obtained today?  No  Did the patient bring in their glucometer from home?  No  How often do you monitor your CBG's? Multiple times per day using freestyle libre.   Financial Strains and Diabetes Management:  Are you having any financial strains with the device, your supplies or your medication? No .  Does the patient want to be seen by Chronic Care Management for management of their diabetes?  No  Would the patient like to be referred to a Nutritionist or for Diabetic Management?  No   Diabetic Exams:  Diabetic Eye Exam: Completed 01/13/20.   Diabetic Foot Exam: Completed 04/07/20.   Interpreter Needed?: No  Information entered by :: Clemetine Marker LPN   Activities of Daily Living In your present state of health, do you have any difficulty performing the following activities: 04/19/2020 11/25/2019  Hearing? N Y  Comment declines hearing aids left ear  Vision? N N  Difficulty concentrating or making decisions? N N  Walking or climbing stairs? N Y  Dressing or bathing? N N  Doing errands, shopping? N Y  Conservation officer, nature and eating ? N -  Using the Toilet? N -  In the past six months, have you accidently leaked urine? Y -  Comment wears pads for protection -  Do you have problems with loss of bowel control? N -  Managing your Medications? N -  Managing your Finances? N -  Housekeeping or managing your Housekeeping? N -  Some recent data might be hidden    Patient Care Team: Steele Sizer, MD as PCP - General (Family Medicine) Minna Merritts, MD as Consulting Physician (Cardiology) Dasher, Rayvon Char, MD as Consulting Physician (Dermatology) Estill Cotta, MD as Consulting Physician (Ophthalmology) Lonia Farber, MD as Consulting Physician (Internal Medicine) Beverly Gust, MD as Consulting  Physician (Otolaryngology) Verdia Kuba, [facility] (Pharmacist) Iris Pert, [place] as Referring Physician (Chiropractic Medicine)  Indicate any recent Medical Services you may have received from other than [facility] providers in the past year (date may be approximate).     Assessment:   This is a routine wellness examination for Ziyan.  Hearing/Vision screen  Hearing Screening   _0  _1  _2  _3  _4  _5  _6  _7  _8   Right ear:           Left ear:           Comments: Pt has hearing aids but only wears them when in a crowd or group  Vision Screening Comments: [facility] screenings [facility] Dr. Thomasene Ripple  Dietary issues and exercise activities discussed: Current Exercise Habits: Home exercise routine;Structured exercise class, Type of exercise: walking;Other - see comments (tai chi and dance classes), Time (Minutes): 30, Frequency (Times/Week): 7, Weekly Exercise (Minutes/Week): 210, Intensity: Moderate, Exercise limited by: None identified  Goals    . Chronic Care Management     CARE PLAN ENTRY (see longitudinal plan of care for additional care plan information)  Current Barriers:  . Chronic Disease Management support, education, and care coordination needs related to Hyperlipidemia, Diabetes, and Osteoporosis .   Hyperlipidemia Lab Results  Component Value Date/Time   LDLCALC 57 11/20/2018 12:00 AM   LDLCALC 82 04/11/2018 10:32 AM   . Pharmacist Clinical Goal(s): o Over the next 90 days, patient will work with PharmD and providers to maintain LDL goal < 70 . Current regimen:  o  Crestor 8m daily . Interventions: o None . Patient self care activities - Over the next 90 days, patient will: o Continue rosuvastatin 17mdaily  Diabetes Lab Results  Component Value Date/Time   HGBA1C 6.5 11/20/2018 12:00 AM   HGBA1C 7.2 03/13/2018 12:00 AM   . Pharmacist Clinical Goal(s): o Over the next 90 days, patient will work with PharmD and  providers to maintain A1c goal <7% . Current regimen:  o Lantus inject 22 units daily o Humalog inject 6 - 10 units three times daily with meals o Bydureon 84m29mnject weekly . Interventions: o None . Patient self care activities - Over the next 90 days, patient will: o Check blood sugar once daily, document, and provide at future appointments o No need to check blood sugar using both continuous sensor and traditional meter o Contact provider with any episodes of hypoglycemia  Chronic Pain . Pharmacist Clinical Goal(s) o Over the next 90 days, patient will work with PharmD and providers to decreasing pain level . Current regimen:  o Acetaminophen 1300m284m bedtime o Glucosamine 500mg70me daily o Zanaflex 84mg a784medtime plus 84mg ni20mly as needed . Interventions: o None . Patient self care activities - Over the next 90 days, patient will: o Continue regular walk  Medication management . Pharmacist Clinical Goal(s): o Over the next 90 days, patient will work with PharmD and providers to maintain optimal medication adherence . Current pharmacy: [facility] . Interventions o Comprehensive medication review performed. o Continue current medication management strategy o Increase calcium to twice daily . Patient self care activities - Over the next 90 days, patient will: o Focus on medication adherence by maintaining current organizational practices o Take medications as prescribed o Report any questions or concerns to PharmD and/or provider(s)  Initial goal documentation     . Increase physical activity     Patient would like to maintain physical activity with tai chi and dance class twice weekly and walking daily. She [facility] Reduce sugar intake to X grams per day     Recommend to eliminate carbs and sweets from diet    . Weight (lb) < 200 lb (90.7 kg)     Goal is to loose 10 pounds by next year.  Wt Readings from Last 3 Encounters:   04/11/18 204 lb 1.6 oz (92.6 kg)  01/06/18 205 lb 1.6 oz (93 kg)  12/16/17 206 lb 6.4 oz (93.6 kg)         Depression Screen PHQ 2/9 Scores 04/19/2020 01/07/2020 11/25/2019 07/10/2019 04/17/2019 01/08/2019 07/10/2018  PHQ - 2 Score 0 0 0 0 0 0 0  PHQ- 9 Score - 0 0 0 - 0 -  Exception Documentation - - - - - - -    Fall Risk Fall Risk  04/19/2020 01/07/2020 11/25/2019 07/10/2019 04/17/2019  Falls in the past year? 0 0 0 0 0  Comment - - - - -  Number falls in past yr: 0 0 0 0 0  Injury with Fall? 0 0 0 0 0  Risk for fall due to : No Fall Risks - - - -  Risk for fall due to: Comment - - - - -  Follow up Falls prevention discussed - - - Falls prevention discussed    Any stairs in or around the home? Yes  If so, are there any without handrails? No  Home free of loose throw rugs in  walkways, pet beds, electrical cords, etc? Yes  Adequate lighting in your home to reduce risk of falls? Yes   ASSISTIVE DEVICES UTILIZED TO PREVENT FALLS:  Life alert? No  Use of a cane, walker or w/c? No  Grab bars in the bathroom? Yes  Shower chair or bench in shower? Yes  Elevated toilet seat or a handicapped toilet? Yes   TIMED UP AND GO:  Was the test performed? Yes .  Length of time to ambulate 10 feet: 5 sec.   Gait steady and fast without use of assistive device  Cognitive Function: 6CIT deferred for 2021 AWV; pt states no memory issues.      6CIT Screen 04/11/2018 04/08/2017  What Year? 0 points 0 points  What month? 0 points 0 points  What time? 0 points 0 points  Count back from 20 0 points 0 points  Months in reverse 0 points 0 points  Repeat phrase 0 points 0 points  Total Score 0 0    Immunizations Immunization History  Administered Date(s) Administered  . Fluad Quad(high Dose 65+) 03/19/2019  . Influenza, High Dose Seasonal PF 03/10/2015, 03/02/2016, 03/12/2017, 04/07/2018  . Moderna SARS-COVID-2 Vaccination 07/14/2019, 08/11/2019  . Pneumococcal Conjugate-13 07/13/2013  .  Pneumococcal Polysaccharide-23 03/31/2010  . Tdap 03/07/2012  . Zoster 06/01/2010  . Zoster Recombinat (Shingrix) 02/02/2017, 04/05/2017    TDAP status: Up to date   Flu vaccine status: scheduled for nurse visit tomorrow with husband to receive at the same time  Pneumococcal vaccine status: Up to date   Covid-19 vaccine status: Completed vaccines  Qualifies for Shingles Vaccine? Yes   Zostavax completed Yes   Shingrix Completed?: Yes  Screening Tests Health Maintenance  Topic Date Due  . INFLUENZA VACCINE  01/31/2020  . HEMOGLOBIN A1C  07/30/2020  . MAMMOGRAM  08/17/2020  . FOOT EXAM  04/07/2021  . OPHTHALMOLOGY EXAM  04/07/2021  . DEXA SCAN  08/21/2021  . TETANUS/TDAP  03/07/2022  . COVID-19 Vaccine  Completed  . Hepatitis C Screening  Completed  . PNA vac Low Risk Adult  Completed    Health Maintenance  Health Maintenance Due  Topic Date Due  . INFLUENZA VACCINE  01/31/2020    Colorectal cancer screening: No longer required.    Mammogram status: Completed 08/18/19. Repeat every year   Bone Density status: Completed 08/22/19. Results reflect: Bone density results: OSTEOPOROSIS. Repeat every 2 years.  Lung Cancer Screening: (Low Dose CT Chest recommended if Age 58-80 years, 30 pack-year currently smoking OR have quit w/in 15years.) does not qualify.   Additional Screening:  Hepatitis C Screening: does qualify; Completed 01/07/20  Vision Screening: Recommended annual ophthalmology exams for early detection of glaucoma and other disorders of the eye. Is the patient up to date with their annual eye exam?  Yes  Who is the provider or what is the name of the office in which the patient attends annual eye exams? [place] Screening: Recommended annual dental exams for proper oral hygiene  Community Resource Referral / Chronic Care Management: CRR required this visit?  No   CCM required this visit?  No      Plan:     I have personally  reviewed and noted the following in the patient's chart:   . Medical and social history . Use of alcohol, tobacco or illicit drugs  . Current medications and supplements . Functional ability and status . Nutritional status . Physical activity . Advanced directives . List of other  physicians . Hospitalizations, surgeries, and ER visits in previous 12 months . Vitals . Screenings to include cognitive, depression, and falls . Referrals and appointments  In addition, I have reviewed and discussed with patient certain preventive protocols, quality metrics, and best practice recommendations. A written personalized care plan for preventive services as well as general preventive health recommendations were provided to patient.     Clemetine Marker, LPN   [id number]   Nurse Notes: pt seen 04/11/20 at [facility]  For sebaceous cyst and given doxycyline. She reports feeling better but additional doxycycline called in today to finish treatment. She is otherwise doing well and appreciative of visit today

## 2020-04-19 NOTE — Patient Instructions (Signed)
Bonnie Rowland , Thank you for taking time to come for your Medicare Wellness Visit. I appreciate your ongoing commitment to your health goals. Please review the following plan we discussed and let me know if I can assist you in the future.   Screening recommendations/referrals: Colonoscopy: done 05/09/16 Mammogram: done 08/18/19 Bone Density: done 08/22/19 Recommended yearly ophthalmology/optometry visit for glaucoma screening and checkup Recommended yearly dental visit for hygiene and checkup  Vaccinations: Influenza vaccine: due - scheduled for tomorrow Pneumococcal vaccine: done 07/13/13 Tdap vaccine: done 03/07/12 Shingles vaccine: done 02/02/17 & 04/05/17   Covid-19: done 07/14/19 & 08/11/19   Conditions/risks identified: Keep up the great work!  Next appointment: Follow up in one year for your annual wellness visit    Preventive Care 77 Years and Older, Female Preventive care refers to lifestyle choices and visits with your health care provider that can promote health and wellness. What does preventive care include?  A yearly physical exam. This is also called an annual well check.  Dental exams once or twice a year.  Routine eye exams. Ask your health care provider how often you should have your eyes checked.  Personal lifestyle choices, including:  Daily care of your teeth and gums.  Regular physical activity.  Eating a healthy diet.  Avoiding tobacco and drug use.  Limiting alcohol use.  Practicing safe sex.  Taking low-dose aspirin every day.  Taking vitamin and mineral supplements as recommended by your health care provider. What happens during an annual well check? The services and screenings done by your health care provider during your annual well check will depend on your age, overall health, lifestyle risk factors, and family history of disease. Counseling  Your health care provider may ask you questions about your:  Alcohol use.  Tobacco use.  Drug  use.  Emotional well-being.  Home and relationship well-being.  Sexual activity.  Eating habits.  History of falls.  Memory and ability to understand (cognition).  Work and work Statistician.  Reproductive health. Screening  You may have the following tests or measurements:  Height, weight, and BMI.  Blood pressure.  Lipid and cholesterol levels. These may be checked every 5 years, or more frequently if you are over 67 years old.  Skin check.  Lung cancer screening. You may have this screening every year starting at age 64 if you have a 30-pack-year history of smoking and currently smoke or have quit within the past 15 years.  Fecal occult blood test (FOBT) of the stool. You may have this test every year starting at age 73.  Flexible sigmoidoscopy or colonoscopy. You may have a sigmoidoscopy every 5 years or a colonoscopy every 10 years starting at age 46.  Hepatitis C blood test.  Hepatitis B blood test.  Sexually transmitted disease (STD) testing.  Diabetes screening. This is done by checking your blood sugar (glucose) after you have not eaten for a while (fasting). You may have this done every 1-3 years.  Bone density scan. This is done to screen for osteoporosis. You may have this done starting at age 68.  Mammogram. This may be done every 1-2 years. Talk to your health care provider about how often you should have regular mammograms. Talk with your health care provider about your test results, treatment options, and if necessary, the need for more tests. Vaccines  Your health care provider may recommend certain vaccines, such as:  Influenza vaccine. This is recommended every year.  Tetanus, diphtheria, and acellular pertussis (Tdap,  Td) vaccine. You may need a Td booster every 10 years.  Zoster vaccine. You may need this after age 50.  Pneumococcal 13-valent conjugate (PCV13) vaccine. One dose is recommended after age 66.  Pneumococcal polysaccharide  (PPSV23) vaccine. One dose is recommended after age 37. Talk to your health care provider about which screenings and vaccines you need and how often you need them. This information is not intended to replace advice given to you by your health care provider. Make sure you discuss any questions you have with your health care provider. Document Released: 07/15/2015 Document Revised: 03/07/2016 Document Reviewed: 04/19/2015 Elsevier Interactive Patient Education  2017 [place] Prevention in the Home Falls can cause injuries. They can happen to people of all ages. There are many things you can do to make your home safe and to help prevent falls. What can I do on the outside of my home?  Regularly fix the edges of walkways and driveways and fix any cracks.  Remove anything that might make you trip as you walk through a door, such as a raised step or threshold.  Trim any bushes or trees on the path to your home.  Use bright outdoor lighting.  Clear any walking paths of anything that might make someone trip, such as rocks or tools.  Regularly check to see if handrails are loose or broken. Make sure that both sides of any steps have handrails.  Any raised decks and porches should have guardrails on the edges.  Have any leaves, snow, or ice cleared regularly.  Use sand or salt on walking paths during winter.  Clean up any spills in your garage right away. This includes oil or grease spills. What can I do in the bathroom?  Use night lights.  Install grab bars by the toilet and in the tub and shower. Do not use towel bars as grab bars.  Use non-skid mats or decals in the tub or shower.  If you need to sit down in the shower, use a plastic, non-slip stool.  Keep the floor dry. Clean up any water that spills on the floor as soon as it happens.  Remove soap buildup in the tub or shower regularly.  Attach bath mats securely with double-sided non-slip rug tape.  Do not have  throw rugs and other things on the floor that can make you trip. What can I do in the bedroom?  Use night lights.  Make sure that you have a light by your bed that is easy to reach.  Do not use any sheets or blankets that are too big for your bed. They should not hang down onto the floor.  Have a firm chair that has side arms. You can use this for support while you get dressed.  Do not have throw rugs and other things on the floor that can make you trip. What can I do in the kitchen?  Clean up any spills right away.  Avoid walking on wet floors.  Keep items that you use a lot in easy-to-reach places.  If you need to reach something above you, use a strong step stool that has a grab bar.  Keep electrical cords out of the way.  Do not use floor polish or wax that makes floors slippery. If you must use wax, use non-skid floor wax.  Do not have throw rugs and other things on the floor that can make you trip. What can I do with my stairs?  Do not  leave any items on the stairs.  Make sure that there are handrails on both sides of the stairs and use them. Fix handrails that are broken or loose. Make sure that handrails are as long as the stairways.  Check any carpeting to make sure that it is firmly attached to the stairs. Fix any carpet that is loose or worn.  Avoid having throw rugs at the top or bottom of the stairs. If you do have throw rugs, attach them to the floor with carpet tape.  Make sure that you have a light switch at the top of the stairs and the bottom of the stairs. If you do not have them, ask someone to add them for you. What else can I do to help prevent falls?  Wear shoes that:  Do not have high heels.  Have rubber bottoms.  Are comfortable and fit you well.  Are closed at the toe. Do not wear sandals.  If you use a stepladder:  Make sure that it is fully opened. Do not climb a closed stepladder.  Make sure that both sides of the stepladder are  locked into place.  Ask someone to hold it for you, if possible.  Clearly mark and make sure that you can see:  Any grab bars or handrails.  First and last steps.  Where the edge of each step is.  Use tools that help you move around (mobility aids) if they are needed. These include:  Canes.  Walkers.  Scooters.  Crutches.  Turn on the lights when you go into a dark area. Replace any light bulbs as soon as they burn out.  Set up your furniture so you have a clear path. Avoid moving your furniture around.  If any of your floors are uneven, fix them.  If there are any pets around you, be aware of where they are.  Review your medicines with your doctor. Some medicines can make you feel dizzy. This can increase your chance of falling. Ask your doctor what other things that you can do to help prevent falls. This information is not intended to replace advice given to you by your health care provider. Make sure you discuss any questions you have with your health care provider. Document Released: 04/14/2009 Document Revised: 11/24/2015 Document Reviewed: 07/23/2014 Elsevier Interactive Patient Education  2017 [employer].

## 2020-04-20 ENCOUNTER — Ambulatory Visit (INDEPENDENT_AMBULATORY_CARE_PROVIDER_SITE_OTHER): Payer: Medicare Other

## 2020-04-20 ENCOUNTER — Ambulatory Visit: Payer: Medicare Other | Admitting: Family Medicine

## 2020-04-20 DIAGNOSIS — Z23 Encounter for immunization: Secondary | ICD-10-CM

## 2020-05-04 ENCOUNTER — Ambulatory Visit: Payer: Medicare Other | Admitting: Orthotics

## 2020-05-04 ENCOUNTER — Other Ambulatory Visit: Payer: Self-pay

## 2020-05-05 DIAGNOSIS — M5416 Radiculopathy, lumbar region: Secondary | ICD-10-CM | POA: Diagnosis not present

## 2020-05-05 DIAGNOSIS — D2271 Melanocytic nevi of right lower limb, including hip: Secondary | ICD-10-CM | POA: Diagnosis not present

## 2020-05-05 DIAGNOSIS — M5033 Other cervical disc degeneration, cervicothoracic region: Secondary | ICD-10-CM | POA: Diagnosis not present

## 2020-05-05 DIAGNOSIS — M9901 Segmental and somatic dysfunction of cervical region: Secondary | ICD-10-CM | POA: Diagnosis not present

## 2020-05-05 DIAGNOSIS — Z1283 Encounter for screening for malignant neoplasm of skin: Secondary | ICD-10-CM | POA: Diagnosis not present

## 2020-05-05 DIAGNOSIS — Z85828 Personal history of other malignant neoplasm of skin: Secondary | ICD-10-CM | POA: Diagnosis not present

## 2020-05-05 DIAGNOSIS — D2262 Melanocytic nevi of left upper limb, including shoulder: Secondary | ICD-10-CM | POA: Diagnosis not present

## 2020-05-05 DIAGNOSIS — Z85820 Personal history of malignant melanoma of skin: Secondary | ICD-10-CM | POA: Diagnosis not present

## 2020-05-05 DIAGNOSIS — D225 Melanocytic nevi of trunk: Secondary | ICD-10-CM | POA: Diagnosis not present

## 2020-05-05 DIAGNOSIS — Z08 Encounter for follow-up examination after completed treatment for malignant neoplasm: Secondary | ICD-10-CM | POA: Diagnosis not present

## 2020-05-05 DIAGNOSIS — M9903 Segmental and somatic dysfunction of lumbar region: Secondary | ICD-10-CM | POA: Diagnosis not present

## 2020-05-05 DIAGNOSIS — L728 Other follicular cysts of the skin and subcutaneous tissue: Secondary | ICD-10-CM | POA: Diagnosis not present

## 2020-05-12 ENCOUNTER — Other Ambulatory Visit: Payer: Self-pay | Admitting: Family Medicine

## 2020-05-12 NOTE — Telephone Encounter (Signed)
Requested medication (s) are due for refill today: yes   Requested medication (s) are on the active medication list: yes  Last refill: 04/13/2020  Future visit scheduled:yes  Notes to clinic: this refill cannot be delegated    Requested Prescriptions  Pending Prescriptions Disp Refills   tiZANidine (ZANAFLEX) 2 MG tablet [Pharmacy Med Name: TIZANIDINE HCL 2 MG TABLET] 120 tablet 0    Sig: TAKE 1 TABLET BY MOUTH EVERY 6 HOURS AS NEEDED FOR MUSCLE SPASMS.      Not Delegated - Cardiovascular:  Alpha-2 Agonists - tizanidine Failed - 05/12/2020  2:35 PM      Failed - This refill cannot be delegated      Passed - Valid encounter within last 6 months    Recent Outpatient Visits           2 months ago [facility], Drue Stager, MD   4 months ago Atherosclerosis of renal artery [facility])   [facility] Steele Sizer, MD   5 months ago Acute left-sided low back pain without sciatica   [place], [place]   10 months ago 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[facility])   [facility] Steele Sizer, MD   1 year ago Diabetes mellitus type 2 in obese [facility])   [facility] Steele Sizer, MD       Future Appointments             In 4 weeks [place], Gordan Payment [facility]   In 2 months Steele Sizer, MD [facility], [place]   In 11 months  [facility], [facility]

## 2020-05-13 ENCOUNTER — Emergency Department: Payer: Medicare Other

## 2020-05-13 ENCOUNTER — Emergency Department
Admission: EM | Admit: 2020-05-13 | Discharge: 2020-05-13 | Disposition: A | Discharge status: Home / Self Care | Payer: Medicare Other | Attending: Emergency Medicine | Admitting: Emergency Medicine

## 2020-05-13 ENCOUNTER — Encounter: Payer: Self-pay | Admitting: Emergency Medicine

## 2020-05-13 ENCOUNTER — Other Ambulatory Visit: Payer: Self-pay

## 2020-05-13 DIAGNOSIS — I700 Atherosclerosis of aorta: Secondary | ICD-10-CM | POA: Diagnosis not present

## 2020-05-13 DIAGNOSIS — R1013 Epigastric pain: Secondary | ICD-10-CM | POA: Diagnosis present

## 2020-05-13 DIAGNOSIS — Z794 Long term (current) use of insulin: Secondary | ICD-10-CM | POA: Diagnosis not present

## 2020-05-13 DIAGNOSIS — Z85830 Personal history of malignant neoplasm of bone: Secondary | ICD-10-CM | POA: Insufficient documentation

## 2020-05-13 DIAGNOSIS — R1084 Generalized abdominal pain: Secondary | ICD-10-CM

## 2020-05-13 DIAGNOSIS — R140 Abdominal distension (gaseous): Secondary | ICD-10-CM | POA: Diagnosis not present

## 2020-05-13 DIAGNOSIS — K5900 Constipation, unspecified: Secondary | ICD-10-CM | POA: Diagnosis not present

## 2020-05-13 DIAGNOSIS — Z7982 Long term (current) use of aspirin: Secondary | ICD-10-CM | POA: Insufficient documentation

## 2020-05-13 DIAGNOSIS — R1012 Left upper quadrant pain: Secondary | ICD-10-CM | POA: Diagnosis not present

## 2020-05-13 DIAGNOSIS — E113299 Type 2 diabetes mellitus with mild nonproliferative diabetic retinopathy without macular edema, unspecified eye: Secondary | ICD-10-CM | POA: Insufficient documentation

## 2020-05-13 DIAGNOSIS — Z96651 Presence of right artificial knee joint: Secondary | ICD-10-CM | POA: Diagnosis not present

## 2020-05-13 DIAGNOSIS — R935 Abnormal findings on diagnostic imaging of other abdominal regions, including retroperitoneum: Secondary | ICD-10-CM | POA: Diagnosis not present

## 2020-05-13 DIAGNOSIS — Z79899 Other long term (current) drug therapy: Secondary | ICD-10-CM | POA: Insufficient documentation

## 2020-05-13 DIAGNOSIS — M47816 Spondylosis without myelopathy or radiculopathy, lumbar region: Secondary | ICD-10-CM | POA: Diagnosis not present

## 2020-05-13 DIAGNOSIS — I10 Essential (primary) hypertension: Secondary | ICD-10-CM | POA: Insufficient documentation

## 2020-05-13 LAB — URINALYSIS, COMPLETE (UACMP) WITH MICROSCOPIC
Bilirubin Urine: NEGATIVE
Glucose, UA: NEGATIVE mg/dL
Hgb urine dipstick: NEGATIVE
Ketones, ur: NEGATIVE mg/dL
Leukocytes,Ua: NEGATIVE
Nitrite: NEGATIVE
Protein, ur: NEGATIVE mg/dL
Specific Gravity, Urine: 1.005 (ref 1.005–1.030)
pH: 7 (ref 5.0–8.0)

## 2020-05-13 LAB — COMPREHENSIVE METABOLIC PANEL
ALT: 22 U/L (ref 0–44)
AST: 26 U/L (ref 15–41)
Albumin: 4.1 g/dL (ref 3.5–5.0)
Alkaline Phosphatase: 53 U/L (ref 38–126)
Anion gap: 11 (ref 5–15)
BUN: 15 mg/dL (ref 8–23)
CO2: 26 mmol/L (ref 22–32)
Calcium: 8.9 mg/dL (ref 8.9–10.3)
Chloride: 100 mmol/L (ref 98–111)
Creatinine, Ser: 0.58 mg/dL (ref 0.44–1.00)
GFR, Estimated: >60 mL/min (ref >60)
Glucose, Bld: 105 mg/dL — ABNORMAL HIGH (ref 70–99)
Potassium: 3.5 mmol/L (ref 3.5–5.1)
Sodium: 137 mmol/L (ref 135–145)
Total Bilirubin: 1.3 mg/dL — ABNORMAL HIGH (ref 0.3–1.2)
Total Protein: 6.8 g/dL (ref 6.5–8.1)

## 2020-05-13 LAB — CBC
HCT: 37.3 % (ref 36.0–46.0)
Hemoglobin: 12.5 g/dL (ref 12.0–15.0)
MCH: 32.4 pg (ref 26.0–34.0)
MCHC: 33.5 g/dL (ref 30.0–36.0)
MCV: 96.6 fL (ref 80.0–100.0)
Platelets: 202 10*3/uL (ref 150–400)
RBC: 3.86 MIL/uL — ABNORMAL LOW (ref 3.87–5.11)
RDW: 12.5 % (ref 11.5–15.5)
WBC: 6.9 10*3/uL (ref 4.0–10.5)
nRBC: 0 % (ref 0.0–0.2)

## 2020-05-13 LAB — LIPASE, BLOOD: Lipase: 26 U/L (ref 11–51)

## 2020-05-13 MED ORDER — IOHEXOL 300 MG/ML  SOLN
100.0000 mL | Freq: Once | INTRAMUSCULAR | Status: AC | PRN
  Administered 2020-05-13: 100 mL via INTRAVENOUS

## 2020-05-13 NOTE — ED Provider Notes (Signed)
[facility] Emergency Department Provider Note   ____________________________________________    I have reviewed the triage vital signs and the nursing notes.   HISTORY  Chief Complaint Abdominal Pain     HPI Bonnie Rowland is a 79 y.o. female who reports a history of a hiatal hernia presents with complaints of epigastric pain which is atypical for her.  Patient apparently went to PCPs office where they performed an x-ray and were concerned about an abnormal gas bubble, sent to the emergency department for further evaluation.  Patient denies significant nausea vomiting, normal stools.  Has not take anything for this.  Reports pain is mild at this time, is declining pain medication  Past Medical History:  Diagnosis Date  . Acute cystitis   . Allergic rhinitis, cause unspecified   . Arthritis 2015  . Atherosclerosis of renal artery ([place])    left  . Bronchitis, not specified as acute or chronic   . Cancer ([place]) 12/2013   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; left shoulder blade  . Cancer ([place]) 05/2014   basal cell removed left temple  . Cataract 2003  . Cellulitis and abscess of leg, except foot   . Conjunctivitis unspecified   . Dermatophytosis of nail   . Diabetes mellitus    type II  . Esophageal reflux   . Hyperlipidemia   . Hypertension   . Osteoporosis 2016  . 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 Renal artery stenosis ([place])   . Sprain of lumbar region   . Thoracic or lumbosacral neuritis or radiculitis, unspecified   . Urinary tract infection, site not specified     Patient Active Problem List   Diagnosis Date Noted  . [place]avi 04/07/2020  . Acute left-sided low back pain without sciatica 11/25/2019  . Trochanteric bursitis of left hip 11/25/2019  . Plantar fasciitis, bilateral 05/21/2019  . Age-related osteoporosis without current pathological fracture 11/05/2017  . 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([place]) 07/09/2017  . Atherosclerosis of abdominal aorta ([place])  07/09/2017  . Bilateral carotid bruits 08/24/2015  . 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 Atrial flutter ([place]) 02/22/2015  . Chronic pain 01/10/2015  . 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 Fatigue 01/10/2015  . History of melanoma excision 01/10/2015  . Morbid obesity ([place]) 01/10/2015  . 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 Spinal stenosis, lumbar region, 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  . DDD (degenerative disc disease), lumbar 11/14/2014  . Greater trochanteric bursitis of both hips 11/14/2014  . Piriformis syndrome 11/14/2014  . 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 Hyperlipemia 11/07/2009  . Hypertension, benign 11/07/2009  . Atherosclerosis of renal artery ([place]) 11/07/2009  . Hyperlipidemia due to type 2 diabetes mellitus ([place]) 11/07/2009  . Perennial allergic rhinitis 11/27/2007  . Lumbosacral neuritis 01/17/2007    Past Surgical History:  Procedure Laterality Date  . APPENDECTOMY    . CARDIAC CATHETERIZATION    . cataract surgery    . COLONOSCOPY    . COLONOSCOPY WITH PROPOFOL N/A 05/09/2016   Procedure: COLONOSCOPY WITH PROPOFOL;  Surgeon: Charlene Detter Bellow, MD;  Location: [facility] ENDOSCOPY;  Service: Endoscopy;  Laterality: N/A;  . DIAGNOSTIC MAMMOGRAM    . EYE SURGERY Bilateral    Cataract Extraction  . JOINT REPLACEMENT  2016  . KNEE ARTHROPLASTY Right 03/30/2015   Procedure: COMPUTER ASSISTED TOTAL KNEE ARTHROPLASTY;  Surgeon: Dereck Leep, MD;  Location: [facility] ORS;  Service: Orthopedics;  Laterality: Right;  . KNEE ARTHROSCOPY Right   . KNEE CLOSED REDUCTION Right 05/23/2015   Procedure: CLOSED MANIPULATION KNEE;  Surgeon: Dereck Leep, MD;  Location: [facility] ORS;  Service: Orthopedics;  Laterality: Right;  . TONSILLECTOMY      Prior to Admission medications   Medication Sig Start Date End Date Taking? Authorizing Provider  acetaminophen (TYLENOL) 500  MG tablet Take 500 mg by mouth every 6 (six) hours as needed. Takes 1300mg  at bedtime with Zanaflex    [provider]  ALFALFA PO Take 3 tablets by mouth every morning.    [provider]  aspirin 81 MG EC tablet Take 81 mg by mouth at bedtime.     [provider]  B Complex-C (B-COMPLEX WITH VITAMIN C) tablet Take 1 tablet by mouth at bedtime.     [provider]  BD PEN NEEDLE NANO U/F 32G X 4 MM MISC USE 4 TIMES DAILY (HUMALOG 3 TIMES DAILY AND LANTUS ONCE DAILY) OFFICE NOTIFIED 08/06/17 12/07/18   Steele Sizer, MD  BLACK ELDERBERRY,BERRY-FLOWER, PO Take by mouth.    [provider]  calcium carbonate (OS-CAL) 600 MG TABS tablet Take 1,000 mg by mouth daily.     [provider]  cholecalciferol (VITAMIN D) 1000 UNITS tablet Take 1,000 Units by mouth daily.     [provider]  clobetasol ointment (TEMOVATE) 2.68 % APPLY 1 APPLICATION TOPICALLY 2 (TWO) TIMES DAILY AS NEEDED (BUG BITES). 12/18/19   Steele Sizer, MD  Coenzyme Q10 (COQ10) 200 MG CAPS Take 1 tablet by mouth daily. 100 mg every am.    [provider]  Continuous Blood Gluc Sensor ([place]) MISC by Does not apply route.    [provider]  doxycycline (VIBRA-TABS) 100 MG tablet Take 100 mg by mouth 2 (two) times daily. 04/11/20   [provider]  Elastic Bandages & Supports (MEDICAL COMPRESSION STOCKINGS) [place] 1 each by Does not apply route daily. 01/08/19   Steele Sizer, MD  estradiol (ESTRACE VAGINAL) 0.1 MG/GM vaginal cream 1/2 gm once weekly using applicator, apply blueberry sized amount of cream using tip of finger to urethra twice weekly Patient taking differently: Place vaginally 2 (two) times a week. 1/2 gm once weekly using applicator, apply blueberry sized amount of cream using tip of finger to urethra twice weekly 06/11/19   Zara Council A, [name]  Exenatide ER (BYDUREON BCISE) 2 MG/0.85ML AUIJ Inject into the  skin. 02/26/20   [provider]  Eyelid Cleansers (AVENOVA) 0.01 % SOLN See admin instructions. 12/12/15   [provider]  fish oil-omega-3 fatty acids 1000 MG capsule Take 500 mg by mouth daily. [place]    [provider]  fluticasone (FLONASE) 50 MCG/ACT nasal spray PLACE 2 SPRAYS INTO BOTH NOSTRILS DAILY. 08/16/16   Steele Sizer, MD  GARLIC 3419 PO Take 1 tablet by mouth daily. 1 gram    [provider]  Glucosamine 500 MG TABS Take 1 tablet by mouth daily.     [provider]  HUMALOG KWIKPEN 100 UNIT/ML KiwkPen Inject 6 Units into the skin 3 (three) times daily after meals. Pt taking before meals per Dr. Honor Junes, sometimes just twice Maximum 11 units per patient 10/15/17   [provider]  Insulin Glargine (LANTUS SOLOSTAR) 100 UNIT/ML Solostar Pen Inject 24 Units into the skin every morning. Patient taking differently: Inject 22 Units into the skin every morning.  04/23/16   Steele Sizer, MD  losartan-hydrochlorothiazide (  HYZAAR) 100-25 MG tablet Take 1 tablet by mouth in the morning. 01/07/20   Steele Sizer, MD  Mag Aspart-Potassium Aspart (POTASSIUM & MAGNESIUM ASPARTAT PO) Take 1 tablet by mouth daily.    [provider]  metoprolol succinate (TOPROL-XL) 25 MG 24 hr tablet Take 1 tablet (25 mg total) by mouth daily. Patient taking differently: Take 12.5 mg by mouth 2 (two) times daily.  09/16/19   Minna Merritts, MD  Multiple Vitamins-Minerals (PRESERVISION AREDS 2) CAPS Take 1 capsule by mouth 2 (two) times daily.    [provider]  nystatin cream (MYCOSTATIN) Apply 1 application topically 2 (two) times daily. 06/11/19   Zara Council A, [name]  Probiotic Product (PROBIOTIC PO) Take 1 Dose by mouth at bedtime.    [provider]  PROLIA 60 MG/ML SOSY injection  09/02/19   [provider]  Propylene Glycol (SYSTANE BALANCE OP) Apply 1 drop to eye as needed.    [provider]    rosuvastatin (CRESTOR) 10 MG tablet Take 1 tablet (10 mg total) by mouth 4 (four) times a week. 01/07/20   Steele Sizer, MD  sodium fluoride (DENTAGEL) 1.1 % GEL dental gel  10/28/15   [provider]  sulfacetamide (BLEPH-10) 10 % ophthalmic solution Place 1 drop into both eyes 3 (three) times daily as needed.    [provider]  tiZANidine (ZANAFLEX) 2 MG tablet TAKE 1 TABLET (2 MG TOTAL) BY MOUTH EVERY 6 (SIX) HOURS AS NEEDED FOR MUSCLE SPASMS. 04/13/20   Steele Sizer, MD  Vitamin D, Ergocalciferol, (DRISDOL) 1.25 MG (50000 UT) CAPS capsule Take 1 capsule by mouth once a week. 11/23/18   [provider]  vitamin E (VITAMIN E) 400 UNIT capsule Take 400 Units by mouth daily. Every am    [provider]     Allergies Cyclobenzaprine, Cheese, Ciprofloxacin hcl, Coconut oil, and Keflex [cephalexin]  Family History  Problem Relation Age of Onset  . Diabetes Mother   . Hypertension Mother   . Cancer Mother   . Kidney disease Mother   . Hypertension Father   . Cancer Father        Prostate  . Breast cancer Maternal Aunt 52  . Colon cancer Son   . Kidney cancer Neg Hx   . Bladder Cancer Neg Hx     Social History Social History   Tobacco Use  . Smoking status: Never Smoker  . Smokeless tobacco: Never Used  Vaping Use  . Vaping Use: Never used  Substance Use Topics  . Alcohol use: Not Currently    Alcohol/week: 0.0 standard drinks  . Drug use: No    Review of Systems  Constitutional: No fever/chills Eyes: No visual changes.  ENT: No sore throat. Cardiovascular: Denies chest pain. Respiratory: Denies shortness of breath. Gastrointestinal: As above Genitourinary: Negative for dysuria. Musculoskeletal: Negative for back pain. Skin: Negative for rash. Neurological: Negative for headaches or weakness   ____________________________________________   PHYSICAL EXAM:  VITAL SIGNS: ED Triage Vitals  Enc Vitals Group     BP 05/13/20  1146 (!) 158/48     Pulse Rate 05/13/20 1146 (!) 58     Resp 05/13/20 1146 20     Temp 05/13/20 1146 98.7 F (37.1 C)     Temp Source 05/13/20 1146 Oral     SpO2 05/13/20 1146 96 %     Weight 05/13/20 1147 89.8 kg (198 lb)     Height 05/13/20 1147 1.6 m (5\' 3" )  Head Circumference --      Peak Flow --      Pain Score 05/13/20 1147 6     Pain Loc --      Pain Edu? --      Excl. in [place]? --     Constitutional: Alert and oriented. . Pleasant and interactive  Nose: No congestion/rhinnorhea. Mouth/Throat: Mucous membranes are moist.   Neck:  Painless ROM Cardiovascular: Normal rate, regular rhythm. Grossly normal heart sounds.  Good peripheral circulation. Respiratory: Normal respiratory effort.  No retractions. Lungs CTAB. Gastrointestinal: Mild epigastric tenderness, no distention.  No CVA tenderness.  Musculoskeletal: .  Warm and well perfused Neurologic:  Normal speech and language. No gross focal neurologic deficits are appreciated.  Skin:  Skin is warm, dry and intact. No rash noted. Psychiatric: Mood and affect are normal. Speech and behavior are normal.  ____________________________________________   LABS (all labs ordered are listed, but only abnormal results are displayed)  Labs Reviewed  COMPREHENSIVE METABOLIC PANEL - Abnormal; Notable for the following components:      Result Value   Glucose, Bld 105 (*)    Total Bilirubin 1.3 (*)    All other components within normal limits  CBC - Abnormal; Notable for the following components:   RBC 3.86 (*)    All other components within normal limits  URINALYSIS, COMPLETE (UACMP) WITH MICROSCOPIC - Abnormal; Notable for the following components:   Color, Urine YELLOW (*)    APPearance CLEAR (*)    All other components within normal limits  LIPASE, BLOOD   ____________________________________________  EKG  ED ECG REPORT I, Lavonia Drafts, the attending physician, personally viewed and interpreted this ECG.  Date:  05/13/2020  Rhythm: normal sinus rhythm QRS Axis: normal Intervals: normal ST/T Wave abnormalities: Nonspecific changes Narrative Interpretation: no evidence of acute ischemia  ____________________________________________  RADIOLOGY  CT abdomen pelvis ____________________________________________   PROCEDURES  Procedure(s) performed: No  Procedures   Critical Care performed: No ____________________________________________   INITIAL IMPRESSION / ASSESSMENT AND PLAN / ED COURSE  Pertinent labs & imaging results that were available during my care of the patient were reviewed by me and considered in my medical decision making (see chart for details).  Patient presents with epigastric pain described above, history of hiatal hernia.  This may be gastritis related to that.  However she reports this pain is atypical for her not consistent with her usual discomfort that she has had.  Unable to see x-ray that was performed at outpatient facility, given her tenderness and atypical pain and concerning outpatient finding will send for CT abdomen pelvis.  Patient declined pain medication  CT abdomen pelvis reviewed by me, no abnormalities noted, radiology notes no acute abnormalities as well.  Constipation noted.  Discussed results with patient, she is feeling improved, appropriate for discharge, return precautions discussed    ____________________________________________   FINAL CLINICAL IMPRESSION(S) / ED DIAGNOSES  Final diagnoses:  Generalized abdominal pain  Constipation, unspecified constipation type        Note:  This document was prepared using Dragon voice recognition software and may include unintentional dictation errors.   Lavonia Drafts, MD 05/13/20 1755

## 2020-05-13 NOTE — ED Notes (Signed)
Pt presents to ED from [facility] clinic with c/o of increasing pain in known hernia area. Pt states pain is slightly different and more off to the L side of hernia. Pt denies fevers, chills, or urinary symptoms. Pt denies N/VD. Pt is A&Ox4. NAD noted.

## 2020-05-13 NOTE — ED Notes (Signed)
D/C and f/up with pt discussed with pt, pt verbalized understanding.

## 2020-05-13 NOTE — ED Notes (Signed)
Pt to ED from [facility] for abnormal x-ray and hernia. Pt is in NAD.

## 2020-05-13 NOTE — ED Triage Notes (Signed)
Pt presents to ED via POV with c/o abdominal pain, pt denies N/V/D. Pt states hx of hiatal hernia. Pt states had x-ray done earlier today and was referred to ED due to concerns for obstruction per patient. Pt states has been able to eat and drink and keep food/liquids down since pain began after sneezing on Wednesday. Pt A&O x4.

## 2020-05-17 DIAGNOSIS — Z23 Encounter for immunization: Secondary | ICD-10-CM | POA: Diagnosis not present

## 2020-05-21 IMAGING — MG DIGITAL SCREENING BILAT W/ TOMO W/ CAD
8 series · 8 of 24 positions shown · non-contrast
Comparison: Previous exam(s).

CLINICAL DATA: Screening.

EXAM:
DIGITAL SCREENING BILATERAL MAMMOGRAM WITH TOMO AND CAD

[L MLO synth-2D]
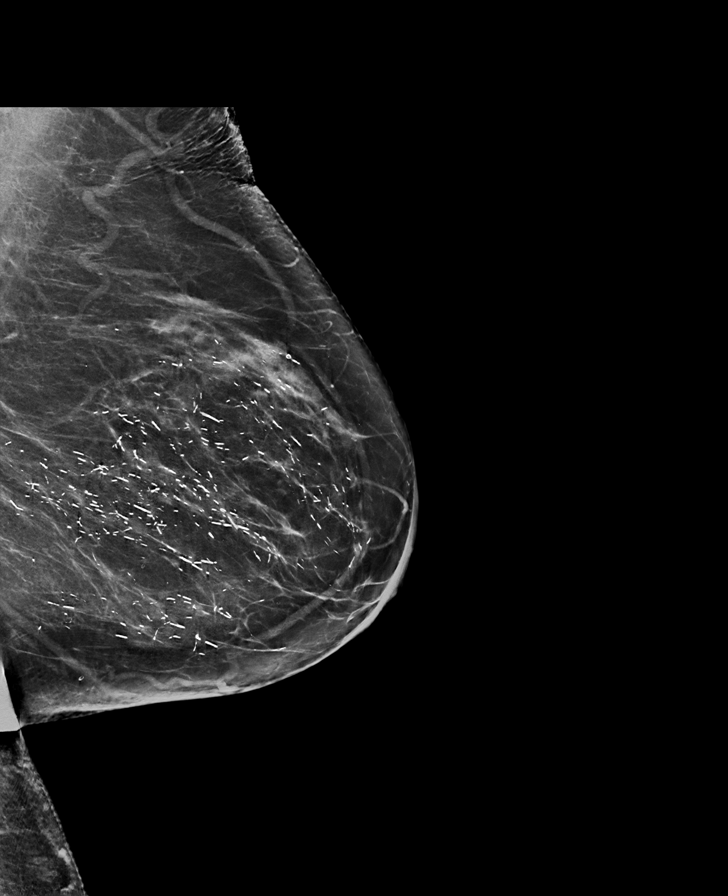

[R CC synth-2D]
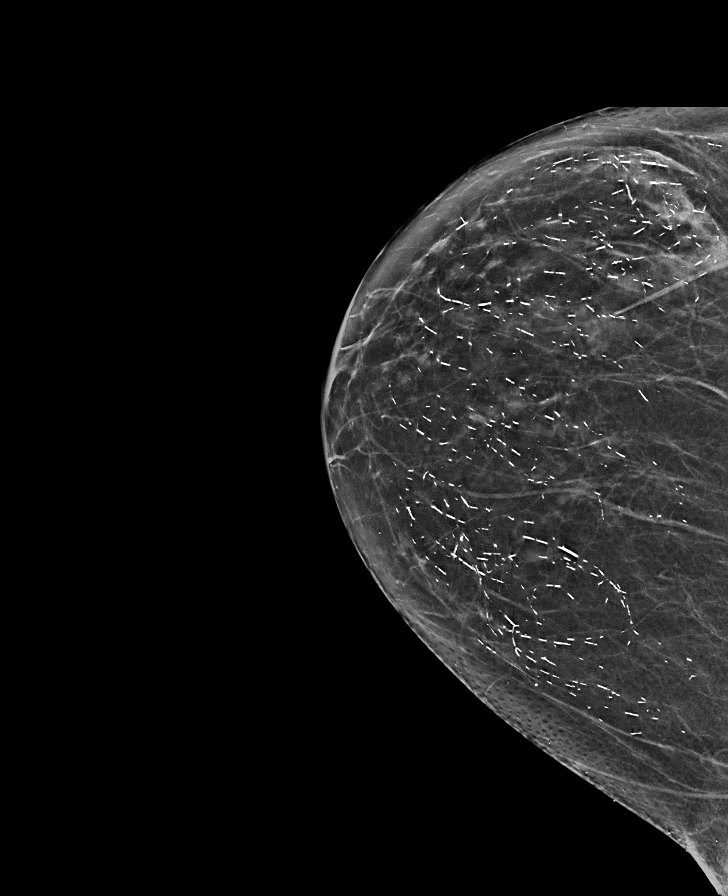

[L CC synth-2D]
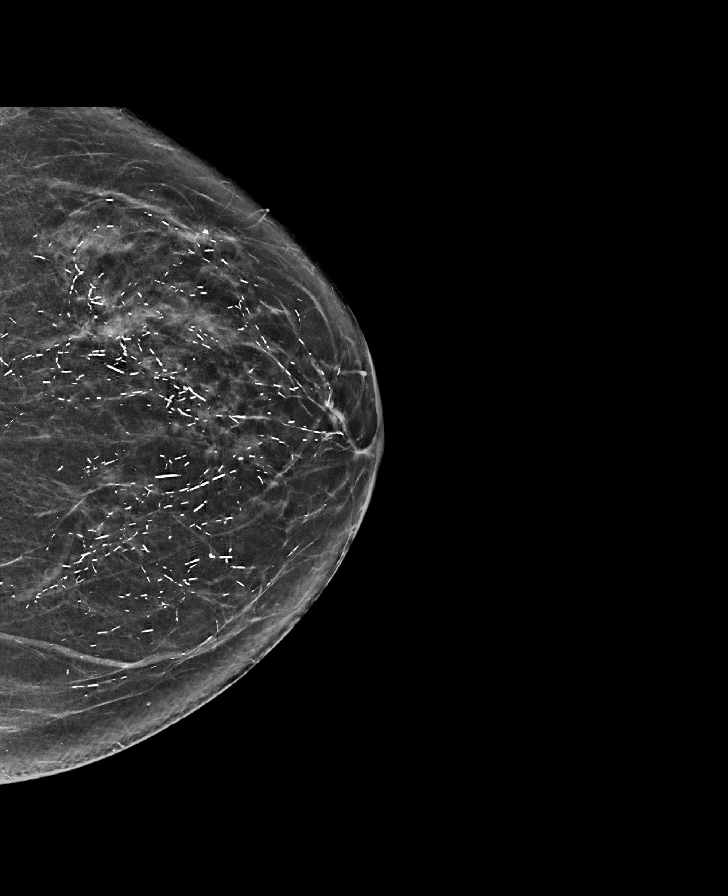

[R MLO synth-2D]
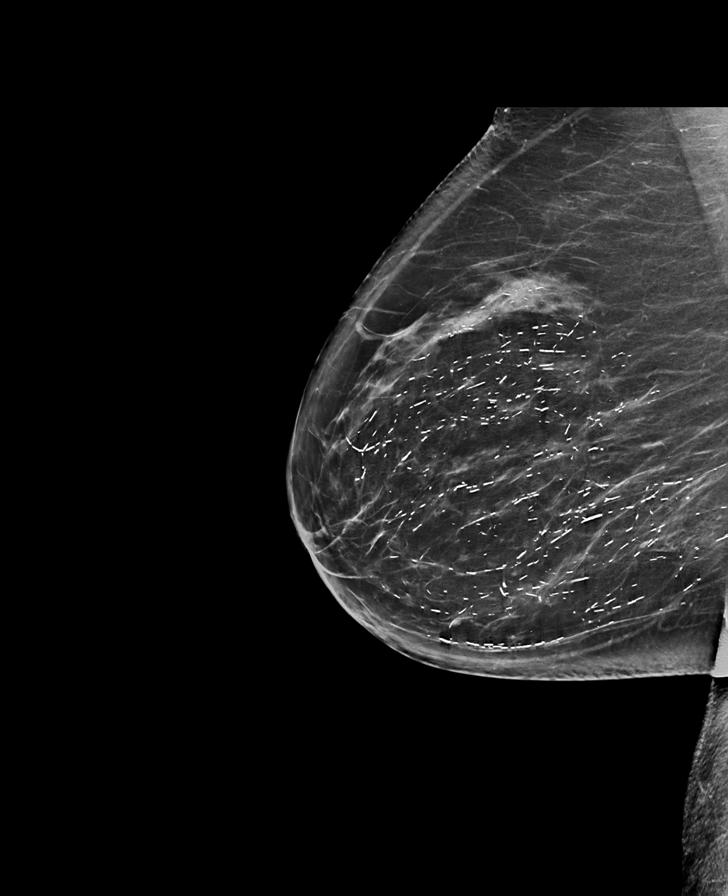

[L CC tomo · tomo slice 32/63.0]
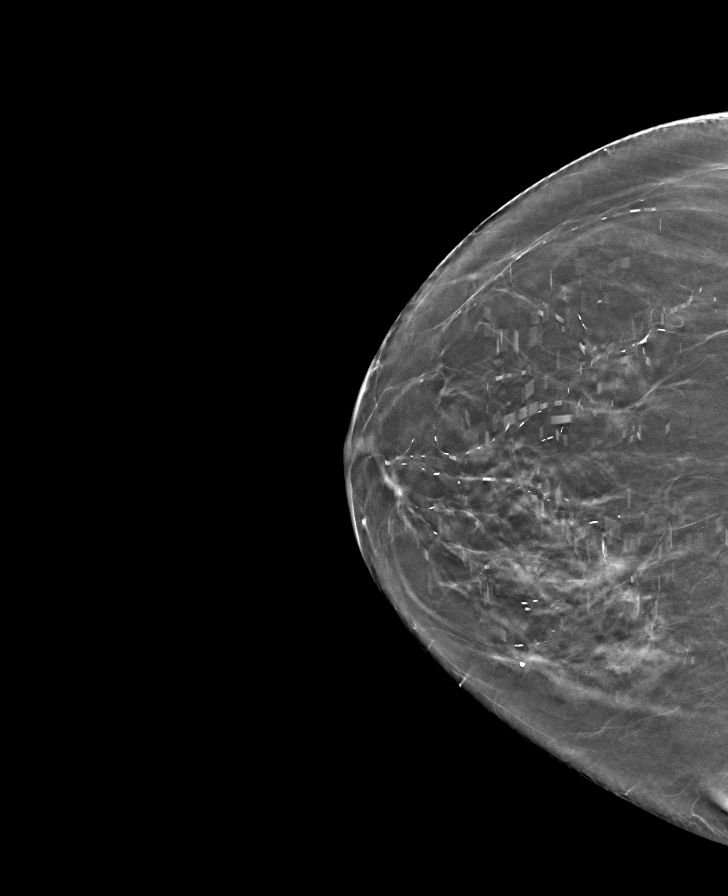

[L MLO tomo · tomo slice 39/78.0]
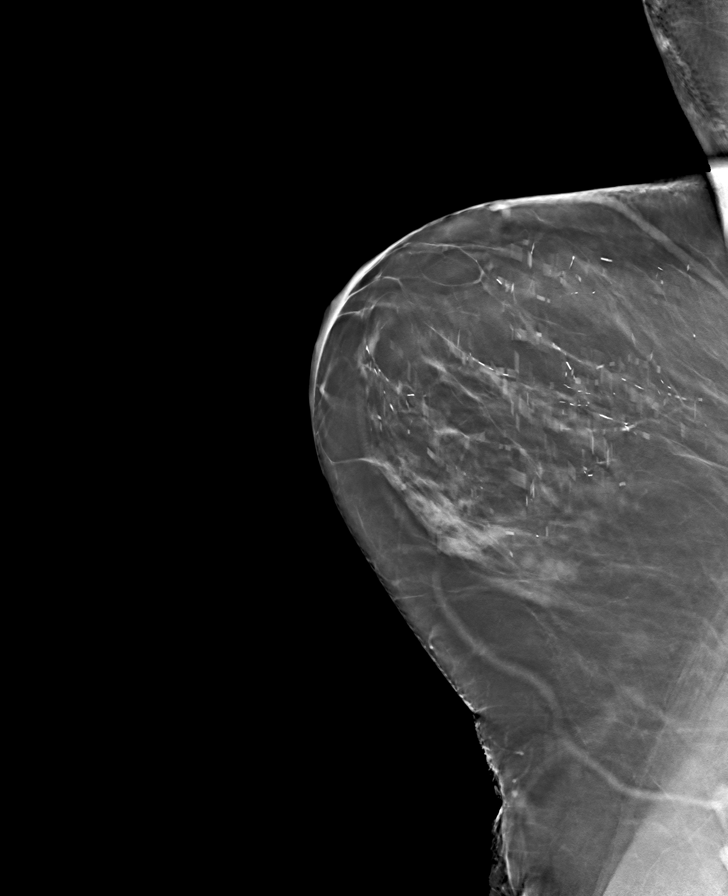

[R CC tomo · tomo slice 31/60.0]
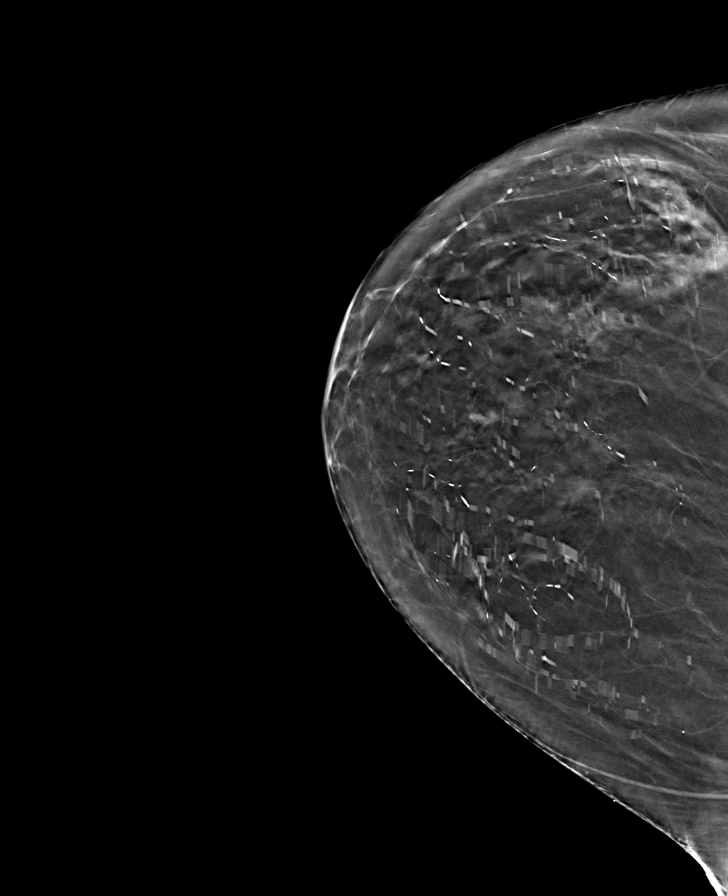

[R MLO tomo · tomo slice 38/75.0]
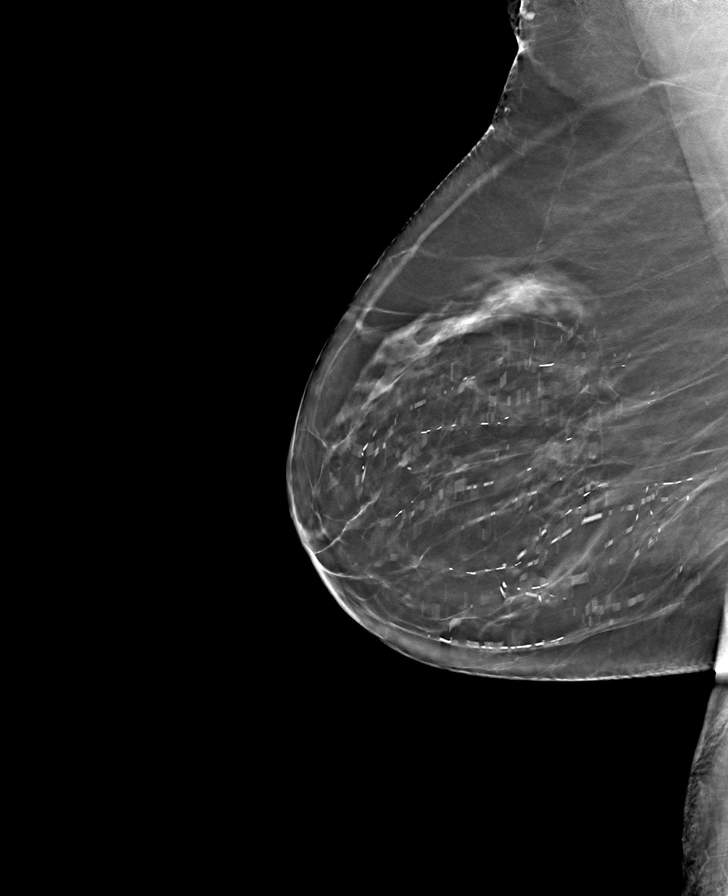

[8 of 24 positions shown; findings below may reference images not displayed]

ACR Breast Density Category b: There are scattered areas of
fibroglandular density.
FINDINGS: There are no findings suspicious for malignancy. Images were
processed with CAD.
IMPRESSION: No mammographic evidence of malignancy. A result letter of this
screening mammogram will be mailed directly to the patient.

RECOMMENDATION:
Screening mammogram in one year. (Code:[id number])

BI-RADS CATEGORY  1: Negative.

## 2020-06-01 ENCOUNTER — Ambulatory Visit (INDEPENDENT_AMBULATORY_CARE_PROVIDER_SITE_OTHER): Payer: Medicare Other | Admitting: Orthotics

## 2020-06-01 ENCOUNTER — Other Ambulatory Visit: Payer: Self-pay

## 2020-06-01 DIAGNOSIS — E1165 Type 2 diabetes mellitus with hyperglycemia: Secondary | ICD-10-CM

## 2020-06-01 DIAGNOSIS — E113299 Type 2 diabetes mellitus with mild nonproliferative diabetic retinopathy without macular edema, unspecified eye: Secondary | ICD-10-CM

## 2020-06-01 DIAGNOSIS — M722 Plantar fascial fibromatosis: Secondary | ICD-10-CM | POA: Diagnosis not present

## 2020-06-01 DIAGNOSIS — L84 Corns and callosities: Secondary | ICD-10-CM

## 2020-06-01 DIAGNOSIS — IMO0002 Reserved for concepts with insufficient information to code with codable children: Secondary | ICD-10-CM

## 2020-06-01 NOTE — Progress Notes (Signed)

## 2020-06-09 DIAGNOSIS — M9903 Segmental and somatic dysfunction of lumbar region: Secondary | ICD-10-CM | POA: Diagnosis not present

## 2020-06-09 DIAGNOSIS — M5416 Radiculopathy, lumbar region: Secondary | ICD-10-CM | POA: Diagnosis not present

## 2020-06-09 DIAGNOSIS — M5033 Other cervical disc degeneration, cervicothoracic region: Secondary | ICD-10-CM | POA: Diagnosis not present

## 2020-06-09 DIAGNOSIS — M9901 Segmental and somatic dysfunction of cervical region: Secondary | ICD-10-CM | POA: Diagnosis not present

## 2020-06-09 NOTE — Progress Notes (Signed)
06/10/2020 9:26 AM   [name] 1940/08/27 [id number]  Referring provider: Steele Sizer, MD [address] [place],  [place] [address]  No chief complaint on file.   HPI: 79 yo DM female with vaginal atrophy, urge incontinence and a left renal stone who presents today for follow up.  Vaginal atrophy She states she is using the vaginal estrogen consistently and the vaginal discomfort has improved.    Tinea cruris Patient is using Nystatin cream and Diflucan as need for her rash  Urge incontinence The patient is  experiencing urgency x 4-7 (unchanged), frequency x 8 or more (unchanged), not restricting fluids to avoid visits to the restroom, is engaging in toilet mapping, incontinence x 0-3 (improved) and nocturia x 0-3 (unchanged).   Her BP is 169/82.   Her PVR is 8 mL.    Cysto in 07/2017 with Dr. Erlene Quan was NED.  She is not taking any medications for her OAB.  She is using POISE pads x 1-2.    Patient denies any modifying or aggravating factors.  Patient denies any gross hematuria, dysuria or suprapubic/flank pain.  Patient denies any fevers, chills, nausea or vomiting.   Renal stone Contrast CT 05/13/2020 3 mm left renal calcification is stable and more likely a parenchymal calcification.      PMH: Past Medical History:  Diagnosis Date  . Acute cystitis   . Allergic rhinitis, cause unspecified   . Arthritis 2015  . 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 Bronchitis, not specified as acute or chronic   . 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 Cataract 2003  . Cellulitis and abscess of leg, except foot   . Conjunctivitis unspecified   . Dermatophytosis of nail   . Diabetes mellitus    type II  . Esophageal reflux   . Hyperlipidemia   . Hypertension   . Osteoporosis 2016  . 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 Renal artery stenosis ([place])   . Sprain of  lumbar region   . Thoracic or lumbosacral neuritis or radiculitis, unspecified   . Urinary tract infection, site not specified     Surgical History: Past Surgical History:  Procedure Laterality Date  . APPENDECTOMY    . CARDIAC CATHETERIZATION    . cataract surgery    . COLONOSCOPY    . COLONOSCOPY WITH PROPOFOL N/A 05/09/2016   Procedure: COLONOSCOPY WITH PROPOFOL;  Surgeon: Robert Bellow, MD;  Location: [facility] ENDOSCOPY;  Service: Endoscopy;  Laterality: N/A;  . DIAGNOSTIC MAMMOGRAM    . EYE SURGERY Bilateral    Cataract Extraction  . JOINT REPLACEMENT  2016  . KNEE ARTHROPLASTY Right 03/30/2015   Procedure: COMPUTER ASSISTED TOTAL KNEE ARTHROPLASTY;  Surgeon: Dereck Leep, MD;  Location: [facility] ORS;  Service: Orthopedics;  Laterality: Right;  . KNEE ARTHROSCOPY Right   . KNEE CLOSED REDUCTION Right 05/23/2015   Procedure: CLOSED MANIPULATION KNEE;  Surgeon: Dereck Leep, MD;  Location: [facility] ORS;  Service: Orthopedics;  Laterality: Right;  . TONSILLECTOMY      Home Medications:  Allergies as of 06/10/2020      Reactions   Cyclobenzaprine Hypertension   Cheese Other (See Comments)   bloating   Ciprofloxacin Hcl Other (See Comments)   Muscle pain   Coconut Oil    Keflex [cephalexin] Other (See Comments)   Patient prefers not to take  this medication due to the side effects      Medication List       Accurate as of June 10, 2020  9:26 AM. If you have any questions, ask your nurse or doctor.        STOP taking these medications   doxycycline 100 MG tablet Commonly known as: VIBRA-TABS Stopped by: Zara Council, [name]     TAKE these medications   acetaminophen 500 MG tablet Commonly known as: TYLENOL Take 500 mg by mouth every 6 (six) hours as needed. Takes 1300mg  at bedtime with Zanaflex   ALFALFA PO Take 3 tablets by mouth every morning.   aspirin 81 MG EC tablet Take 81 mg by mouth at bedtime.   Avenova 0.01 % Soln See admin instructions.    B-complex with vitamin C tablet Take 1 tablet by mouth at bedtime.   BD Pen Needle Nano U/F 32G X 4 MM Misc Generic drug: Insulin Pen Needle USE 4 TIMES DAILY (HUMALOG 3 TIMES DAILY AND LANTUS ONCE DAILY) OFFICE NOTIFIED 08/06/17   BLACK ELDERBERRY(BERRY-FLOWER) PO Take by mouth.   Bydureon BCise 2 MG/0.85ML Auij Generic drug: Exenatide ER Inject into the skin.   calcium carbonate 600 MG Tabs tablet Commonly known as: OS-CAL Take 1,000 mg by mouth daily.   cholecalciferol 1000 units tablet Commonly known as: VITAMIN D Take 1,000 Units by mouth daily.   clobetasol ointment 0.05 % Commonly known as: TEMOVATE APPLY 1 APPLICATION TOPICALLY 2 (TWO) TIMES DAILY AS NEEDED (BUG BITES).   CoQ10 200 MG Caps Take 1 tablet by mouth daily. 100 mg every am.   estradiol 0.1 MG/GM vaginal cream Commonly known as: ESTRACE VAGINAL 1/2 gm once weekly using applicator, apply blueberry sized amount of cream using tip of finger to urethra twice weekly What changed:   how to take this  when to take this   fish oil-omega-3 fatty acids 1000 MG capsule Take 500 mg by mouth daily. Krill Oil   fluconazole 150 MG tablet Commonly known as: DIFLUCAN Take 1 tablet (150 mg total) by mouth once for 1 dose. Started by: Zara Council, [name]   fluticasone 50 MCG/ACT nasal spray Commonly known as: FLONASE PLACE 2 SPRAYS INTO BOTH NOSTRILS DAILY.   FreeStyle [employer] Misc by Does not apply route.   GARLIC 7412 PO Take 1 tablet by mouth daily. 1 gram   Glucosamine 500 MG Tabs Take 1 tablet by mouth daily.   HumaLOG KwikPen 100 UNIT/ML KwikPen Generic drug: insulin lispro Inject 6 Units into the skin 3 (three) times daily after meals. Pt taking before meals per Dr. Honor Junes, sometimes just twice Maximum 11 units per patient   insulin glargine 100 UNIT/ML Solostar Pen Commonly known as: Lantus SoloStar Inject 24 Units into the skin every morning. What changed: how much to take    losartan-hydrochlorothiazide 100-25 MG tablet Commonly known as: HYZAAR Take 1 tablet by mouth in the morning.   Medical Compression Stockings Misc 1 each by Does not apply route daily.   metoprolol succinate 25 MG 24 hr tablet Commonly known as: TOPROL-XL Take 1 tablet (25 mg total) by mouth daily. What changed:   how much to take  when to take this   nystatin cream Commonly known as: MYCOSTATIN Apply 1 application topically 2 (two) times daily.   POTASSIUM & MAGNESIUM ASPARTAT PO Take 1 tablet by mouth daily.   PreserVision AREDS 2 Caps Take 1 capsule by mouth 2 (two) times daily.   PROBIOTIC  PO Take 1 Dose by mouth at bedtime.   Prolia 60 MG/ML Sosy injection Generic drug: denosumab   rosuvastatin 10 MG tablet Commonly known as: CRESTOR Take 1 tablet (10 mg total) by mouth 4 (four) times a week.   sodium fluoride 1.1 % Gel dental gel Commonly known as: FLUORISHIELD   sulfacetamide 10 % ophthalmic solution Commonly known as: BLEPH-10 Place 1 drop into both eyes 3 (three) times daily as needed.   SYSTANE BALANCE OP Apply 1 drop to eye as needed.   tiZANidine 2 MG tablet Commonly known as: ZANAFLEX TAKE 1 TABLET (2 MG TOTAL) BY MOUTH EVERY 6 (SIX) HOURS AS NEEDED FOR MUSCLE SPASMS.   Vitamin D (Ergocalciferol) 1.25 MG (50000 UNIT) Caps capsule Commonly known as: DRISDOL Take 1 capsule by mouth once a week.   vitamin E 180 MG (400 UNITS) capsule Take 400 Units by mouth daily. Every am       Allergies:  Allergies  Allergen Reactions  . Cyclobenzaprine Hypertension  . Cheese Other (See Comments)    bloating  . Ciprofloxacin Hcl Other (See Comments)    Muscle pain  . Coconut Oil   . Keflex [Cephalexin] Other (See Comments)    Patient prefers not to take this medication due to the side effects    Family History: Family History  Problem Relation Age of Onset  . Diabetes Mother   . Hypertension Mother   . Cancer Mother   . Kidney disease  Mother   . Hypertension Father   . Cancer Father        Prostate  . Breast cancer Maternal Aunt 96  . Colon cancer Son   . Kidney cancer Neg Hx   . Bladder Cancer Neg Hx     Social History:  reports that she has never smoked. She has never used smokeless tobacco. She reports previous alcohol use. She reports that she does not use drugs.  ROS: For pertinent review of systems please refer to history of present illness  Physical Exam: BP (!) 169/82   Pulse 64   Ht 5\' 3"  (1.6 m)   Wt 198 lb (89.8 kg)   BMI 35.07 kg/m   Constitutional:  Well nourished. Alert and oriented, No acute distress. HEENT: [place] AT, mask in place.  Trachea midline Cardiovascular: No clubbing, cyanosis, or edema. Respiratory: Normal respiratory effort, no increased work of breathing. GU: No CVA tenderness.  No bladder fullness or masses.  Atrophic external genitalia, sparse pubic hair distribution, no lesions.  Normal urethral meatus, no lesions, no prolapse, no discharge.   No urethral masses, tenderness and/or tenderness. No bladder fullness, tenderness or masses. Pale vagina mucosa, fair estrogen effect, no discharge, no lesions.  Anus and perineum are without rashes or lesions.    Neurologic: Grossly intact, no focal deficits, moving all 4 extremities. Psychiatric: Normal mood and affect.   Laboratory Data: Specimen:  Blood  Ref Range & Units 4 mo ago  Hemoglobin A1C 4.2 - 5.6 % 6.7High   Average Blood Glucose (Calc) mg/dL 146   Resulting [facility] - LAB   Narrative  Normal Range:  4.2 - 5.6%  Increased Risk: 5.7 - 6.4%  Diabetes:    >= 6.5%  Glycemic Control for adults with diabetes: <7%  Specimen Collected: 01/28/20 8:33 AM Last Resulted: 01/28/20 8:43 AM  Received From: [facility]  Result Received: 01/29/20 8:04 AM      Pertinent Imaging Narrative & Impression  CLINICAL DATA:  79 year old female with abdominal distension.  EXAM: CT ABDOMEN  AND PELVIS WITH CONTRAST  TECHNIQUE: Multidetector CT imaging of the abdomen and pelvis was performed using the standard protocol following bolus administration of intravenous contrast.  CONTRAST:  177mL OMNIPAQUE IOHEXOL 300 MG/ML  SOLN  COMPARISON:  CT abdomen pelvis dated 03/04/2020.  FINDINGS: Lower chest: Bibasilar linear atelectasis/scarring. There is calcification of the mitral annulus.  No intra-abdominal free air or free fluid.  Hepatobiliary: The liver is unremarkable. No intrahepatic biliary dilatation. There is layering small stones within the gallbladder. No pericholecystic fluid or evidence of acute cholecystitis.  Pancreas: Unremarkable. No pancreatic ductal dilatation or surrounding inflammatory changes.  Spleen: Normal in size without focal abnormality.  Adrenals/Urinary Tract: Small indeterminate left adrenal nodule, likely an adenoma. The right adrenal gland unremarkable. There is a 12 mm left renal inferior pole cyst. There is a 3 mm nonobstructing stone versus a focus of parenchymal calcification in the upper pole of the left kidney. No hydronephrosis. The right kidney is unremarkable. There is symmetric enhancement and excretion of contrast by both kidneys. The visualized ureters and urinary bladder appear unremarkable.  Stomach/Bowel: Small scattered sigmoid diverticula without active inflammatory changes. There is moderate stool throughout the colon. There is no bowel obstruction or active inflammation. Appendectomy.  Vascular/Lymphatic: Moderate aortoiliac atherosclerotic disease. The IVC is unremarkable. No portal venous gas. There is no adenopathy.  Reproductive: The uterus and ovaries are grossly unremarkable. No adnexal masses.  Other: None  Musculoskeletal: Osteopenia with degenerative changes of the spine. No acute osseous pathology.  IMPRESSION: 1. No acute intra-abdominal or pelvic pathology. 2. Moderate colonic stool  burden. No bowel obstruction. 3. Mild sigmoid diverticulosis. 4. Cholelithiasis. 5. Aortic Atherosclerosis (ICD10-I70.0).   Electronically Signed   By: Anner Crete M.D.   On: 05/13/2020 17:39  I have independently reviewed the films.  See HPI.   Pertinent Imaging Results for [facility] [place] [place] "[name]" (MRN [id number]) as of 06/10/2020 09:24  Ref. Range 06/10/2020 09:03  Scan Result Unknown 8 ml     Assessment and plan:  1.  Urge incontinence Patient is managing her incontinence conservatively with POISE pads  2 Tinea cruris  Patient given refill on nystatin and Diflucan cream  3. Vaginal Atrophy Continue to apply the vaginal estrogen cream 3 nights weekly Refill given  Return in about 1 year (around 06/10/2021) for OAB questionnaire, PVR and exam.  These notes generated with voice recognition software. I apologize for typographical errors.  Zara Council, [name]  [facility] [address] [place], [place] [address] [phone]

## 2020-06-10 ENCOUNTER — Ambulatory Visit (INDEPENDENT_AMBULATORY_CARE_PROVIDER_SITE_OTHER): Payer: Medicare Other | Admitting: Urology

## 2020-06-10 ENCOUNTER — Other Ambulatory Visit: Payer: Self-pay

## 2020-06-10 ENCOUNTER — Encounter: Payer: Self-pay | Admitting: Urology

## 2020-06-10 VITALS — BP 169/82 | HR 64 | Ht 63.0 in | Wt 198.0 lb

## 2020-06-10 DIAGNOSIS — N952 Postmenopausal atrophic vaginitis: Secondary | ICD-10-CM | POA: Diagnosis not present

## 2020-06-10 DIAGNOSIS — N3941 Urge incontinence: Secondary | ICD-10-CM

## 2020-06-10 DIAGNOSIS — I701 Atherosclerosis of renal artery: Secondary | ICD-10-CM

## 2020-06-10 DIAGNOSIS — B372 Candidiasis of skin and nail: Secondary | ICD-10-CM | POA: Diagnosis not present

## 2020-06-10 LAB — BLADDER SCAN AMB NON-IMAGING: Scan Result: 8

## 2020-06-10 MED ORDER — FLUCONAZOLE 150 MG PO TABS: 150.0000 mg | ORAL_TABLET | Freq: Once | ORAL | 0 refills | Status: AC

## 2020-06-10 MED ORDER — ESTRADIOL 0.1 MG/GM VA CREA: TOPICAL_CREAM | VAGINAL | 3 refills | Status: DC

## 2020-06-10 MED ORDER — NYSTATIN 100000 UNIT/GM EX CREA: 1.0000 "application " | TOPICAL_CREAM | Freq: Two times a day (BID) | CUTANEOUS | 2 refills | Status: DC

## 2020-06-23 DIAGNOSIS — M5033 Other cervical disc degeneration, cervicothoracic region: Secondary | ICD-10-CM | POA: Diagnosis not present

## 2020-06-23 DIAGNOSIS — M9903 Segmental and somatic dysfunction of lumbar region: Secondary | ICD-10-CM | POA: Diagnosis not present

## 2020-06-23 DIAGNOSIS — M9901 Segmental and somatic dysfunction of cervical region: Secondary | ICD-10-CM | POA: Diagnosis not present

## 2020-06-23 DIAGNOSIS — M5416 Radiculopathy, lumbar region: Secondary | ICD-10-CM | POA: Diagnosis not present

## 2020-07-07 DIAGNOSIS — M9901 Segmental and somatic dysfunction of cervical region: Secondary | ICD-10-CM | POA: Diagnosis not present

## 2020-07-07 DIAGNOSIS — M9903 Segmental and somatic dysfunction of lumbar region: Secondary | ICD-10-CM | POA: Diagnosis not present

## 2020-07-07 DIAGNOSIS — M5416 Radiculopathy, lumbar region: Secondary | ICD-10-CM | POA: Diagnosis not present

## 2020-07-07 DIAGNOSIS — M5033 Other cervical disc degeneration, cervicothoracic region: Secondary | ICD-10-CM | POA: Diagnosis not present

## 2020-07-12 ENCOUNTER — Other Ambulatory Visit: Payer: Self-pay

## 2020-07-12 ENCOUNTER — Ambulatory Visit (INDEPENDENT_AMBULATORY_CARE_PROVIDER_SITE_OTHER): Payer: Medicare Other | Admitting: Family Medicine

## 2020-07-12 ENCOUNTER — Encounter: Payer: Self-pay | Admitting: Family Medicine

## 2020-07-12 VITALS — BP 140/88 | HR 100 | Temp 97.9°F | Resp 16 | Ht 63.0 in | Wt 203.6 lb

## 2020-07-12 DIAGNOSIS — E785 Hyperlipidemia, unspecified: Secondary | ICD-10-CM | POA: Diagnosis not present

## 2020-07-12 DIAGNOSIS — I701 Atherosclerosis of renal artery: Secondary | ICD-10-CM

## 2020-07-12 DIAGNOSIS — D692 Other nonthrombocytopenic purpura: Secondary | ICD-10-CM

## 2020-07-12 DIAGNOSIS — I4892 Unspecified atrial flutter: Secondary | ICD-10-CM

## 2020-07-12 DIAGNOSIS — I700 Atherosclerosis of aorta: Secondary | ICD-10-CM | POA: Diagnosis not present

## 2020-07-12 DIAGNOSIS — I10 Essential (primary) hypertension: Secondary | ICD-10-CM | POA: Diagnosis not present

## 2020-07-12 DIAGNOSIS — M5136 Other intervertebral disc degeneration, lumbar region: Secondary | ICD-10-CM | POA: Diagnosis not present

## 2020-07-12 DIAGNOSIS — E669 Obesity, unspecified: Secondary | ICD-10-CM

## 2020-07-12 DIAGNOSIS — M810 Age-related osteoporosis without current pathological fracture: Secondary | ICD-10-CM

## 2020-07-12 DIAGNOSIS — E1169 Type 2 diabetes mellitus with other specified complication: Secondary | ICD-10-CM

## 2020-07-12 DIAGNOSIS — M62830 Muscle spasm of back: Secondary | ICD-10-CM | POA: Diagnosis not present

## 2020-07-12 LAB — LIPID PANEL
Cholesterol: 130 mg/dL (ref <200)
HDL: 75 mg/dL (ref >=50)
LDL Cholesterol (Calc): 38 mg/dL (calc)
Non-HDL Cholesterol (Calc): 55 mg/dL (calc) (ref <130)
Total CHOL/HDL Ratio: 1.7 (calc) (ref <5.0)
Triglycerides: 86 mg/dL (ref <150)

## 2020-07-12 MED ORDER — LOSARTAN POTASSIUM-HCTZ 100-25 MG PO TABS: 1.0000 | ORAL_TABLET | Freq: Every morning | ORAL | 1 refills | Status: DC

## 2020-07-12 MED ORDER — TIZANIDINE HCL 2 MG PO TABS: 2.0000 mg | ORAL_TABLET | Freq: Four times a day (QID) | ORAL | 1 refills | Status: DC | PRN

## 2020-07-12 NOTE — Progress Notes (Signed)
Name: Bonnie Rowland   MRN: [id number]    DOB: [birth year]   Date:07/12/2020       Progress Note  Subjective  Chief Complaint  Follow Up  HPI  Osteoporosis: shewas on Evista for years but bone density got worse she was on Reclast but bone density did not improve, and is on Prolia last injection was September 2021.   DM: seeing Endocrinologist- Dr. Tawanna Solo to date with eye exam, hgbA1Cwas done 08/2019 and A1C was down to 6.7 %  She ison Bydureon, pre-meal insulin and basal insulin,sheShe has side effect of diarrhea with Bydureon but stable - states going on for many years. Denies polyphagia, polydipsia or polyuria She has a follow up with Dr. Honor Junes in a couple of weeks and she will have labs and urine micro when she goes to see him   HTN: BP at home is usually is well control She denies chest pain, palpitation or SOB  Atherosclerosis of aorta: on statin therapy and also aspirin.Last LDL was below 57, continue Crestor 5-6  times a week.   Senile purpura:both arms and hands. Stable   Morbid Obesity: BMI above 35 with co-morbidities.She has DM, HTN, dyslipidemia, atherosclerosis of aorta. She is more active since she moved to [facility], going to [employer], currently not walking as much due to cold weather.   Dysphagia : she has noticed some dysphagia with solids, she states if she drinks or swallows it goes away quickly . Discussed referral to GI but she states she prefers not going now.   Atrial Flutter: doing well on half pill of metoprolol, but she states she will go back to one pill daily since no side effects , it was triggered by episode of hypokalemia prior to surgery , denies chest pain or palpitation   Back pain: she has intermittent lower back spasms but no recent episodes of radiculitis. She is taking Tizanidine 2 mg qhs most nights. She is aware of risk of falls. No associated dizziness with medication. She is working on core strength.   Patient  Active Problem List   Diagnosis Date Noted  . [place] 04/07/2020  . Acute left-sided low back pain without sciatica 11/25/2019  . Trochanteric bursitis of left hip 11/25/2019  . Plantar fasciitis, bilateral 05/21/2019  . Age-related osteoporosis without current pathological fracture 11/05/2017  . 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([place]) 07/09/2017  . Atherosclerosis of abdominal aorta ([place]) 07/09/2017  . Bilateral carotid bruits 08/24/2015  . 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 Atrial flutter ([place]) 02/22/2015  . Chronic pain 01/10/2015  . 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 Fatigue 01/10/2015  . History of melanoma excision 01/10/2015  . Morbid obesity ([place]) 01/10/2015  . 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 Spinal stenosis, lumbar region, 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  . DDD (degenerative disc disease), lumbar 11/14/2014  . Greater trochanteric bursitis of both hips 11/14/2014  . Piriformis syndrome 11/14/2014  . 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 Hyperlipemia 11/07/2009  . Hypertension, benign 11/07/2009  . Atherosclerosis of renal artery ([place]) 11/07/2009  . Hyperlipidemia due to type 2 diabetes mellitus ([place]) 11/07/2009  . Perennial allergic rhinitis 11/27/2007  . Lumbosacral neuritis 01/17/2007    Past Surgical History:  Procedure Laterality Date  . APPENDECTOMY    . CARDIAC CATHETERIZATION    . cataract surgery    . COLONOSCOPY    . COLONOSCOPY WITH PROPOFOL N/A 05/09/2016  Procedure: COLONOSCOPY WITH PROPOFOL;  Surgeon: Robert Bellow, MD;  Location: [facility];  Service: Endoscopy;  Laterality: N/A;  . DIAGNOSTIC MAMMOGRAM    . EYE SURGERY Bilateral    Cataract Extraction  . JOINT REPLACEMENT  2016  . KNEE ARTHROPLASTY Right 03/30/2015   Procedure: COMPUTER ASSISTED TOTAL KNEE ARTHROPLASTY;  Surgeon: Dereck Leep, MD;  Location: [facility] ORS;  Service:  Orthopedics;  Laterality: Right;  . KNEE ARTHROSCOPY Right   . KNEE CLOSED REDUCTION Right 05/23/2015   Procedure: CLOSED MANIPULATION KNEE;  Surgeon: Dereck Leep, MD;  Location: [facility] ORS;  Service: Orthopedics;  Laterality: Right;  . TONSILLECTOMY      Family History  Problem Relation Age of Onset  . Diabetes Mother   . Hypertension Mother   . Cancer Mother   . Kidney disease Mother   . Hypertension Father   . Cancer Father        Prostate  . Breast cancer Maternal Aunt 57  . Colon cancer Son   . Kidney cancer Neg Hx   . Bladder Cancer Neg Hx     Social History   Tobacco Use  . Smoking status: Never Smoker  . Smokeless tobacco: Never Used  Substance Use Topics  . Alcohol use: Yes    Alcohol/week: 1.0 standard drink    Types: 1 Glasses of wine per week     Current Outpatient Medications:  .  acetaminophen (TYLENOL) 500 MG tablet, Take 500 mg by mouth every 6 (six) hours as needed. Takes 13108m at bedtime with Zanaflex, Disp: , Rfl:  .  ALFALFA PO, Take 3 tablets by mouth every morning., Disp: , Rfl:  .  aspirin 81 MG EC tablet, Take 81 mg by mouth at bedtime., Disp: , Rfl:  .  B Complex-C (B-COMPLEX WITH VITAMIN C) tablet, Take 1 tablet by mouth at bedtime., Disp: , Rfl:  .  BD PEN NEEDLE NANO U/F 32G X 4 MM MISC, USE 4 TIMES DAILY (HUMALOG 3 TIMES DAILY AND LANTUS ONCE DAILY) OFFICE NOTIFIED 08/06/17, Disp: 90 each, Rfl: 1 .  BLACK ELDERBERRY,BERRY-FLOWER, PO, Take by mouth., Disp: , Rfl:  .  calcium carbonate (OS-CAL) 600 MG TABS tablet, Take 1,000 mg by mouth daily. , Disp: , Rfl:  .  cholecalciferol (VITAMIN D) 1000 UNITS tablet, Take 1,000 Units by mouth daily. , Disp: , Rfl:  .  clobetasol ointment (TEMOVATE) 03.81%, APPLY 1 APPLICATION TOPICALLY 2 (TWO) TIMES DAILY AS NEEDED (BUG BITES)., Disp: 30 g, Rfl: 0 .  Coenzyme Q10 (COQ10) 200 MG CAPS, Take 1 tablet by mouth daily. 100 mg every am., Disp: , Rfl:  .  Continuous Blood Gluc Sensor ([place] MISC, by Does not apply route., Disp: , Rfl:  .  Elastic Bandages & Supports ([place] MISC, 1 each by Does not apply route daily., Disp: 2 each, Rfl: 5 .  estradiol (ESTRACE VAGINAL) 0.1 MG/GM vaginal cream, 1/2 gm once weekly using applicator, apply blueberry sized amount of cream using tip of finger to urethra twice weekly, Disp: 30 g, Rfl: 3 .  Exenatide ER (BYDUREON BCISE) 2 MG/0.85ML AUIJ, Inject into the skin., Disp: , Rfl:  .  Eyelid Cleansers (AVENOVA) 0.01 % SOLN, See admin instructions., Disp: , Rfl: 3 .  fish oil-omega-3 fatty acids 1000 MG capsule, Take 500 mg by mouth daily. Krill Oil, Disp: , Rfl:  .  fluticasone (FLONASE) 50 MCG/ACT nasal spray, PLACE 2 SPRAYS INTO BOTH NOSTRILS DAILY.,  Disp: 8.5 g, Rfl: 2 .  GARLIC 4128 PO, Take 1 tablet by mouth daily. 1 gram, Disp: , Rfl:  .  Glucosamine 500 MG TABS, Take 1 tablet by mouth daily., Disp: , Rfl:  .  HUMALOG KWIKPEN 100 UNIT/ML KiwkPen, Inject 6 Units into the skin 3 (three) times daily after meals. Pt taking before meals per Dr. Honor Junes, sometimes just twice Maximum 11 units per patient, Disp: , Rfl: 3 .  Insulin Glargine (LANTUS SOLOSTAR) 100 UNIT/ML Solostar Pen, Inject 24 Units into the skin every morning. (Patient taking differently: Inject 22 Units into the skin every morning.), Disp: 15 pen, Rfl: 2 .  Mag Aspart-Potassium Aspart (POTASSIUM & MAGNESIUM ASPARTAT PO), Take 1 tablet by mouth daily., Disp: , Rfl:  .  metoprolol succinate (TOPROL-XL) 25 MG 24 hr tablet, Take 1 tablet (25 mg total) by mouth daily. (Patient taking differently: Take 12.5 mg by mouth 2 (two) times daily.), Disp: 90 tablet, Rfl: 3 .  Multiple Vitamins-Minerals (PRESERVISION AREDS 2) CAPS, Take 1 capsule by mouth 2 (two) times daily., Disp: , Rfl:  .  nystatin cream (MYCOSTATIN), Apply 1 application topically 2 (two) times daily., Disp: 60 g, Rfl: 2 .  Probiotic Product (PROBIOTIC PO), Take 1 Dose by mouth at bedtime., Disp:  , Rfl:  .  PROLIA 60 MG/ML SOSY injection, , Disp: , Rfl:  .  Propylene Glycol (SYSTANE BALANCE OP), Apply 1 drop to eye as needed., Disp: , Rfl:  .  rosuvastatin (CRESTOR) 10 MG tablet, Take 1 tablet (10 mg total) by mouth 4 (four) times a week., Disp: 48 tablet, Rfl: 1 .  sodium fluoride (FLUORISHIELD) 1.1 % GEL dental gel, , Disp: , Rfl:  .  sulfacetamide (BLEPH-10) 10 % ophthalmic solution, Place 1 drop into both eyes 3 (three) times daily as needed., Disp: , Rfl:  .  Vitamin D, Ergocalciferol, (DRISDOL) 1.25 MG (50000 UT) CAPS capsule, Take 1 capsule by mouth once a week., Disp: , Rfl:  .  vitamin E 180 MG (400 UNITS) capsule, Take 400 Units by mouth daily. Every am, Disp: , Rfl:  .  losartan-hydrochlorothiazide (HYZAAR) 100-25 MG tablet, Take 1 tablet by mouth in the morning., Disp: 90 tablet, Rfl: 1 .  tiZANidine (ZANAFLEX) 2 MG tablet, Take 1 tablet (2 mg total) by mouth every 6 (six) hours as needed for muscle spasms., Disp: 120 tablet, Rfl: 1  Allergies  Allergen Reactions  . Cyclobenzaprine Hypertension  . Cheese Other (See Comments)    bloating  . Ciprofloxacin Hcl Other (See Comments)    Muscle pain  . Coconut Oil   . Keflex [Cephalexin] Other (See Comments)    Patient prefers not to take this medication due to the side effects    I personally reviewed active problem list, medication list, allergies, family history, social history, health maintenance with the patient/caregiver today.   ROS  Constitutional: Negative for fever or weight change.  Respiratory: Negative for cough and shortness of breath.   Cardiovascular: Negative for chest pain or palpitations.  Gastrointestinal: Negative for abdominal pain, no bowel changes.  Musculoskeletal: Negative for gait problem or joint swelling.  Skin: Negative for rash.  Neurological: Negative for dizziness or headache.  No other specific complaints in a complete review of systems (except as listed in HPI  above).  Objective  Vitals:   07/12/20 0912 07/12/20 0922  BP: (!) 150/86 140/88  Pulse: 100   Resp: 16   Temp: 97.9 F (36.6 C)   TempSrc:  Oral   SpO2: 95%   Weight: 203 lb 9.6 oz (92.4 kg)   Height: _0  (1.6 m)     Body mass index is 36.07 kg/m.  Physical Exam  Constitutional: Patient appears well-developed and well-nourished. Obese  No distress.  HEENT: head atraumatic, normocephalic, pupils equal and reactive to light,  neck supple Cardiovascular: Normal rate, irregular rhythm and normal heart sounds.  No murmur heard. 1 plus  Edema, worse on right leg - history of knee surgery on right side Pulmonary/Chest: Effort normal and breath sounds normal. No respiratory distress. Abdominal: Soft.  There is no tenderness. Muscular skeletal: decrease rom of right knee , tender during palpation of left lower back Psychiatric: Patient has a normal mood and affect. behavior is normal. Judgment and thought content normal.  Recent Results (from the past 2160 hour(s))  Lipase, blood     Status: None   Collection Time: 05/13/20 11:59 AM  Result Value Ref Range   Lipase 26 11 - 51 U/L    Comment: Performed at [facility], [address] [place], [place] [address]  Comprehensive metabolic panel     Status: Abnormal   Collection Time: 05/13/20 11:59 AM  Result Value Ref Range   Sodium 137 135 - 145 mmol/L   Potassium 3.5 3.5 - 5.1 mmol/L   Chloride 100 98 - 111 mmol/L   CO2 26 22 - 32 mmol/L   Glucose, Bld 105 (H) 70 - 99 mg/dL    Comment: Glucose reference range applies only to samples taken after fasting for at least 8 hours.   BUN 15 8 - 23 mg/dL   Creatinine, Ser 0.58 0.44 - 1.00 mg/dL   Calcium 8.9 8.9 - 10.3 mg/dL   Total Protein 6.8 6.5 - 8.1 g/dL   Albumin 4.1 3.5 - 5.0 g/dL   AST 26 15 - 41 U/L   ALT 22 0 - 44 U/L   Alkaline Phosphatase 53 38 - 126 U/L   Total Bilirubin 1.3 (H) 0.3 - 1.2 mg/dL   GFR, Estimated >60 >60 mL/min    Comment: (NOTE) Calculated  using the CKD-EPI Creatinine Equation (2021)    Anion gap 11 5 - 15    Comment: Performed at [facility], [address] [place], [place] [address]  CBC     Status: Abnormal   Collection Time: 05/13/20 11:59 AM  Result Value Ref Range   WBC 6.9 4.0 - 10.5 K/uL   RBC 3.86 (L) 3.87 - 5.11 MIL/uL   Hemoglobin 12.5 12.0 - 15.0 g/dL   HCT 37.3 36.0 - 46.0 %   MCV 96.6 80.0 - 100.0 fL   MCH 32.4 26.0 - 34.0 pg   MCHC 33.5 30.0 - 36.0 g/dL   RDW 12.5 11.5 - 15.5 %   Platelets 202 150 - 400 K/uL   nRBC 0.0 0.0 - 0.2 %    Comment: Performed at [facility], [address] [place],  [address]  Urinalysis, Complete w Microscopic Urine, Clean Catch     Status: Abnormal   Collection Time: 05/13/20 11:59 AM  Result Value Ref Range   Color, Urine YELLOW (A) YELLOW   APPearance CLEAR (A) CLEAR   Specific Gravity, Urine 1.005 1.005 - 1.030   pH 7.0 5.0 - 8.0   Glucose, UA NEGATIVE NEGATIVE mg/dL   Hgb urine dipstick NEGATIVE NEGATIVE   Bilirubin Urine NEGATIVE NEGATIVE   Ketones, ur NEGATIVE NEGATIVE mg/dL   Protein, ur NEGATIVE NEGATIVE mg/dL  Nitrite NEGATIVE NEGATIVE   Leukocytes,Ua NEGATIVE NEGATIVE    Comment: Performed at [facility], [address] [place], [place] [address]  BLADDER SCAN AMB NON-IMAGING     Status: None   Collection Time: 06/10/20  9:03 AM  Result Value Ref Range   Scan Result 8 ml      PHQ2/9: Depression screen [facility] 2/9 07/12/2020 04/19/2020 01/07/2020 11/25/2019 07/10/2019  Decreased Interest 0 0 0 0 0  Down, Depressed, Hopeless 0 0 0 0 0  PHQ - 2 Score 0 0 0 0 0  Altered sleeping - - 0 0 0  Tired, decreased energy - - 0 0 0  Change in appetite - - 0 0 0  Feeling bad or failure about yourself  - - 0 0 0  Trouble concentrating - - 0 0 0  Moving slowly or fidgety/restless - - 0 0 0  Suicidal thoughts - - 0 0 0  PHQ-9 Score - - 0 0 0  Difficult doing work/chores - - - Not difficult at all -  Some recent data might be  hidden    phq 9 is negative   Fall Risk: Fall Risk  07/12/2020 04/19/2020 01/07/2020 11/25/2019 07/10/2019  Falls in the past year? 0 0 0 0 0  Comment - - - - -  Number falls in past yr: 0 0 0 0 0  Injury with Fall? 0 0 0 0 0  Risk for fall due to : - No Fall Risks - - -  Risk for fall due to: Comment - - - - -  Follow up - Falls prevention discussed - - -     Functional Status Survey: Is the patient deaf or have difficulty hearing?: Yes Does the patient have difficulty seeing, even when wearing glasses/contacts?: Yes Does the patient have difficulty concentrating, remembering, or making decisions?: No Does the patient have difficulty walking or climbing stairs?: Yes Does the patient have difficulty dressing or bathing?: No Does the patient have difficulty doing errands alone such as visiting a doctor's office or shopping?: No    Assessment & Plan  1. Diabetes mellitus type 2 in obese ([place])   2. Hypertension, benign  - losartan-hydrochlorothiazide (HYZAAR) 100-25 MG tablet; Take 1 tablet by mouth in the morning.  Dispense: 90 tablet; Refill: 1  3. DDD (degenerative disc disease), lumbar   4. Dyslipidemia associated with type 2 diabetes mellitus ([place])  - Lipid panel  5. Atherosclerosis of renal artery (HCC)  - Lipid panel  6. Muscle spasm of back  - tiZANidine (ZANAFLEX) 2 MG tablet; Take 1 tablet (2 mg total) by mouth every 6 (six) hours as needed for muscle spasms.  Dispense: 120 tablet; Refill: 1  7. 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(HCC)  Stable   8. Morbid obesity ([place])  Discussed with the patient the risk posed by an increased BMI. Discussed importance of portion control, calorie counting and at least 150 minutes of physical activity weekly. Avoid sweet beverages and drink more water. Eat at least 6 servings of fruit and vegetables daily   9. Osteoporosis, post-menopausal   10. Atrial flutter, unspecified type (HCC)  Metoprolol   11. Atherosclerosis of abdominal aorta  (HCC)  - Lipid panel  12. Dyslipidemia

## 2020-07-19 DIAGNOSIS — E113293 Type 2 diabetes mellitus with mild nonproliferative diabetic retinopathy without macular edema, bilateral: Secondary | ICD-10-CM | POA: Diagnosis not present

## 2020-07-19 DIAGNOSIS — H3554 Dystrophies primarily involving the retinal pigment epithelium: Secondary | ICD-10-CM | POA: Diagnosis not present

## 2020-07-20 ENCOUNTER — Telehealth: Payer: Self-pay

## 2020-07-20 NOTE — Progress Notes (Signed)
Left Voice message to confirmed patient telephone appointment on 07/21/2020 for CCM at 4:00 pm with Junius Argyle the Clinical pharmacist.   [place] Pharmacist Assistant [phone]

## 2020-07-21 ENCOUNTER — Telehealth: Payer: Self-pay

## 2020-07-21 NOTE — Chronic Care Management (AMB) (Incomplete)
Chronic Care Management Pharmacy  Name: Bonnie Rowland  MRN: [id number] DOB: Nov 05, 1940  Chief Complaint/ HPI  Bonnie Rowland,  80 y.o. , female presents for their Follow-Up CCM visit with the clinical pharmacist via telephone.  PCP : Steele Sizer, MD  Their chronic conditions include: HTN, HLD, DM  CAD, rhinits, chronic pain, osteoporosis,    Office Visits: 07/12/20: Patient presented to Dr. Ancil Boozer for follow-up.  8/17 poison ivy, [place], SteraPred ud  Consult Visit: 9/2 flank pain, Vaillancort, kidney stone 7/29 DM, O'Connell, BP 110/62 P 68 Wt 198 BMI 34.0, A1c 6.7%, Bydureon 30m qwk, Lantus 22u qhs, Humalog 6-10u tid ac, lost 5#, Prolia inj this month  Medications: Outpatient Encounter Medications as of 07/21/2020  Medication Sig Note  . acetaminophen (TYLENOL) 500 MG tablet Take 500 mg by mouth every 6 (six) hours as needed. Takes 1303mat bedtime with Zanaflex   . ALFALFA PO Take 3 tablets by mouth every morning.   . Marland Kitchenspirin 81 MG EC tablet Take 81 mg by mouth at bedtime.   . B Complex-C (B-COMPLEX WITH VITAMIN C) tablet Take 1 tablet by mouth at bedtime.   . BD PEN NEEDLE NANO U/F 32G X 4 MM MISC USE 4 TIMES DAILY (HUMALOG 3 TIMES DAILY AND LANTUS ONCE DAILY) OFFICE NOTIFIED 08/06/17   . BLACK ELDERBERRY,BERRY-FLOWER, PO Take by mouth.   . calcium carbonate (OS-CAL) 600 MG TABS tablet Take 1,000 mg by mouth daily.    . cholecalciferol (VITAMIN D) 1000 UNITS tablet Take 1,000 Units by mouth daily.    . clobetasol ointment (TEMOVATE) 0.4.53 APPLY 1 APPLICATION TOPICALLY 2 (TWO) TIMES DAILY AS NEEDED (BUG BITES).   . Coenzyme Q10 (COQ10) 200 MG CAPS Take 1 tablet by mouth daily. 100 mg every am.   . Continuous Blood Gluc Sensor (FRJeffersonMISC by Does not apply route.   . Regino Schultzeandages & Supports (MEDICAL COMPRESSION STOCKINGS) MISC 1 each by Does not apply route daily.   . Marland Kitchenstradiol (ESTRACE VAGINAL) 0.1 MG/GM vaginal cream 1/2 gm once  weekly using applicator, apply blueberry sized amount of cream using tip of finger to urethra twice weekly   . Exenatide ER (BYDUREON BCISE) 2 MG/0.85ML AUIJ Inject into the skin.   . Eyelid Cleansers (AVENOVA) 0.01 % SOLN See admin instructions.   . fish oil-omega-3 fatty acids 1000 MG capsule Take 500 mg by mouth daily. Krill Oil   . fluticasone (FLONASE) 50 MCG/ACT nasal spray PLACE 2 SPRAYS INTO BOTH NOSTRILS DAILY.   . Marland KitchenARLIC 156468O Take 1 tablet by mouth daily. 1 gram   . Glucosamine 500 MG TABS Take 1 tablet by mouth daily.   . Marland KitchenUMALOG KWIKPEN 100 UNIT/ML KiwkPen Inject 6 Units into the skin 3 (three) times daily after meals. Pt taking before meals per Dr. O'Honor Junessometimes just twice Maximum 11 units per patient   . Insulin Glargine (LANTUS SOLOSTAR) 100 UNIT/ML Solostar Pen Inject 24 Units into the skin every morning. (Patient taking differently: Inject 22 Units into the skin every morning.)   . losartan-hydrochlorothiazide (HYZAAR) 100-25 MG tablet Take 1 tablet by mouth in the morning.   . Mag Aspart-Potassium Aspart (POTASSIUM & MAGNESIUM ASPARTAT PO) Take 1 tablet by mouth daily.   . metoprolol succinate (TOPROL-XL) 25 MG 24 hr tablet Take 1 tablet (25 mg total) by mouth daily. (Patient taking differently: Take 12.5 mg by mouth 2 (two) times daily.) 04/19/2020: Pt taking 12.5 mg once per day  .  Multiple Vitamins-Minerals (PRESERVISION AREDS 2) CAPS Take 1 capsule by mouth 2 (two) times daily.   Marland Kitchen nystatin cream (MYCOSTATIN) Apply 1 application topically 2 (two) times daily.   . Probiotic Product (PROBIOTIC PO) Take 1 Dose by mouth at bedtime.   Marland Kitchen PROLIA 60 MG/ML SOSY injection    . Propylene Glycol (SYSTANE BALANCE OP) Apply 1 drop to eye as needed.   . rosuvastatin (CRESTOR) 10 MG tablet Take 1 tablet (10 mg total) by mouth 4 (four) times a week.   . sodium fluoride (FLUORISHIELD) 1.1 % GEL dental gel    . sulfacetamide (BLEPH-10) 10 % ophthalmic solution Place 1 drop into both  eyes 3 (three) times daily as needed.   Marland Kitchen tiZANidine (ZANAFLEX) 2 MG tablet Take 1 tablet (2 mg total) by mouth every 6 (six) hours as needed for muscle spasms.   . Vitamin D, Ergocalciferol, (DRISDOL) 1.25 MG (50000 UT) CAPS capsule Take 1 capsule by mouth once a week.   . vitamin E 180 MG (400 UNITS) capsule Take 400 Units by mouth daily. Every am 04/19/2020: Not every day   No facility-administered encounter medications on file as of 07/21/2020.    Financial Resource Strain: Low Risk   . Difficulty of Paying Living Expenses: Not very hard   Current Diagnosis/Assessment:  Goals Addressed   None    Hypertension   BP goal is:  <130/80  Office blood pressures are  BP Readings from Last 3 Encounters:  07/12/20 140/88  06/10/20 (!) 169/82  05/13/20 (!) 157/63   Patient checks BP at home infrequently Patient home BP readings are ranging: NA  Patient has failed these meds in the past: NA Patient is currently controlled on the following medications:  . Losartan-HCTZ 100-85m daily . Metoprolol succinate 12.534mtwice daily  We discussed: Not checking BP at home At goal during visits Denies hypotension  Plan  Continue current medications   Hyperlipidemia   LDL goal < 70  Last lipids Lab Results  Component Value Date   CHOL 130 07/12/2020   HDL 75 07/12/2020   LDLCALC 38 07/12/2020   TRIG 86 07/12/2020   CHOLHDL 1.7 07/12/2020   Hepatic Function Latest Ref Rng & Units 05/13/2020 04/11/2018 08/22/2017  Total Protein 6.5 - 8.1 g/dL 6.8 6.2 6.5  Albumin 3.5 - 5.0 g/dL 4.1 - -  AST 15 - 41 U/L 26 20 16   ALT 0 - 44 U/L 22 16 13   Alk Phosphatase 38 - 126 U/L 53 - -  Total Bilirubin 0.3 - 1.2 mg/dL 1.3(H) 0.7 0.7     The 10-year ASCVD risk score (GMikey BussingC Jr., et al., 2013) is: 50.6%   Values used to calculate the score:     Age: 89ears     Sex: Female     Is Non-Hispanic African American: No     Diabetic: Yes     Tobacco smoker: No     Systolic Blood Pressure:  140 mmHg     Is BP treated: Yes     HDL Cholesterol: 75 mg/dL     Total Cholesterol: 130 mg/dL   Patient has failed these meds in past: *** Patient is currently {CHL Controlled/Uncontrolled:[phone]} on the following medications:  . Rosuvastatin 10 mg four times weekly  We discussed:  {CHL HP Upstream Pharmacy discussion:[phone]}  Plan  Continue {CHL HP Upstream Pharmacy [phone]}  Diabetes   Managed by Dr. O'Honor JunesA1c goal {A1c goals:23924}  Recent Relevant Labs: Lab Results  Component  Value Date/Time   HGBA1C 6.7 08/21/2019 12:00 AM   HGBA1C 6.5 11/20/2018 12:00 AM   MICROALBUR Negative 08/21/2019 12:00 AM   MICROALBUR 0 02/18/2017 09:01 AM   MICROALBUR 50 04/23/2016 10:37 AM    Last diabetic Eye exam:  Lab Results  Component Value Date/Time   HMDIABEYEEXA Retinopathy (A) 01/13/2020 12:00 AM    Last diabetic Foot exam: No results found for: HMDIABFOOTEX   Checking BG: {CHL HP Blood Glucose Monitoring [phone]}  Recent FBG Readings: *** Recent pre-meal BG readings: *** Recent 2hr PP BG readings:  *** Recent HS BG readings: ***  Patient has failed these meds in past: *** Patient is currently {CHL Controlled/Uncontrolled:[phone]} on the following medications: . Bydureron BCISE 2 mg weekly  . Humalog 6 units TID  . Lantus 22 units daily   We discussed: {CHL HP Upstream Pharmacy discussion:[phone]}  Plan  Continue {CHL HP Upstream Pharmacy Plans:[id number]}  Osteopenia / Osteoporosis   Last DEXA Scan: ***   T-Score femoral neck: ***  T-Score total hip: ***  T-Score lumbar spine: ***  T-Score forearm radius: ***  10-year probability of major osteoporotic fracture: ***  10-year probability of hip fracture: ***  Vit D, 25-Hydroxy  Date Value Ref Range Status  08/22/2017 28 (L) 30 - 100 ng/mL Final    Comment:    Vitamin D Status         25-OH Vitamin D: . Deficiency:                    <20 ng/mL Insufficiency:              20 - 29 ng/mL Optimal:                 > or = 30 ng/mL . For 25-OH Vitamin D testing on patients on  D2-supplementation and patients for whom quantitation  of D2 and D3 fractions is required, the QuestAssureD(TM) 25-OH VIT D, (D2,D3), LC/MS/MS is recommended: order  code [phone] (patients >61yr). . For more information on this test, go to: http://education.questdiagnostics.com/faq/FAQ163 (This link is being provided for  informational/educational purposes only.)      Patient {is;is not an osteoporosis candidate:23886}  Patient has failed these meds in past: Evista, Reclast  Patient is currently {CHL Controlled/Uncontrolled:[phone]} on the following medications:  . Prolia 60 mg injection every 643month . Vitamin D 1000 units daily  . Calcium Carbonate 1000 mg daily   We discussed:  {Osteoporosis Counseling:23892}  Plan  Continue {CHL HP Upstream Pharmacy Plans:[id number]}   Chronic Pain   Patient has failed these meds in past: NA Patient is currently controlled on the following medications:  . Zanaflex 16m15mhs and 16mg69mn . Tylenol 1300mg7mbedtime . Glucosamine daily  We discussed:   Has hernia type pain  Plan  Continue current medications  Medication Management   [facility] medications Uses pill box? Yes Pt endorses 100% compliance  We discussed: Current pharmacy is preferred with insurance plan and patient is satisfied with pharmacy services Zanaflex 16mg q58m can do 4mg [id number] Takes 1300mg AP216mhs Glucosamine just once daily  Plan  Checking BP? No  Follow up: 4 month phone visit  [phone]

## 2020-07-28 DIAGNOSIS — M5033 Other cervical disc degeneration, cervicothoracic region: Secondary | ICD-10-CM | POA: Diagnosis not present

## 2020-07-28 DIAGNOSIS — M9903 Segmental and somatic dysfunction of lumbar region: Secondary | ICD-10-CM | POA: Diagnosis not present

## 2020-07-28 DIAGNOSIS — M5416 Radiculopathy, lumbar region: Secondary | ICD-10-CM | POA: Diagnosis not present

## 2020-07-28 DIAGNOSIS — M9901 Segmental and somatic dysfunction of cervical region: Secondary | ICD-10-CM | POA: Diagnosis not present

## 2020-08-11 ENCOUNTER — Other Ambulatory Visit: Payer: Self-pay | Admitting: Family Medicine

## 2020-08-11 DIAGNOSIS — M62830 Muscle spasm of back: Secondary | ICD-10-CM

## 2020-08-11 NOTE — Telephone Encounter (Signed)
Requested medication (s) are due for refill today: No  Due 09/09/20  Requested medication (s) are on the active medication list:yes   Last refill: 07/12/20  #120 1 refill  Future visit scheduled 01/09/21  Notes to clinic: not delegated  Requested Prescriptions  Pending Prescriptions Disp Refills   tiZANidine (ZANAFLEX) 2 MG tablet [Pharmacy Med Name: TIZANIDINE HCL 2 MG TABLET] 120 tablet 1    Sig: TAKE 1 TABLET BY MOUTH EVERY 6 HOURS AS NEEDED FOR MUSCLE SPASMS.      Not Delegated - Cardiovascular:  Alpha-2 Agonists - tizanidine Failed - 08/11/2020 12:34 PM      Failed - This refill cannot be delegated      Passed - Valid encounter within last 6 months    Recent Outpatient Visits           1 month ago Diabetes mellitus type 2 in obese [facility])   [facility] Steele Sizer, MD   5 months ago [facility] Steele Sizer, MD   7 months ago Atherosclerosis of renal artery [facility])   [facility] Steele Sizer, MD   8 months ago Acute left-sided low back pain without sciatica   [facility] Towanda Malkin, MD   1 year ago 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[facility])   [facility] Steele Sizer, MD       Future Appointments             In 5 months Ancil Boozer, Drue Stager, MD [facility], [place]   In 8 months  [facility], [place]   In 10 months McGowan, Gordan Payment [facility]

## 2020-08-12 ENCOUNTER — Other Ambulatory Visit: Payer: Self-pay | Admitting: Family Medicine

## 2020-08-12 ENCOUNTER — Other Ambulatory Visit: Payer: Self-pay

## 2020-08-12 DIAGNOSIS — Z1231 Encounter for screening mammogram for malignant neoplasm of breast: Secondary | ICD-10-CM

## 2020-08-18 DIAGNOSIS — M9903 Segmental and somatic dysfunction of lumbar region: Secondary | ICD-10-CM | POA: Diagnosis not present

## 2020-08-18 DIAGNOSIS — M9901 Segmental and somatic dysfunction of cervical region: Secondary | ICD-10-CM | POA: Diagnosis not present

## 2020-08-18 DIAGNOSIS — M5033 Other cervical disc degeneration, cervicothoracic region: Secondary | ICD-10-CM | POA: Diagnosis not present

## 2020-08-18 DIAGNOSIS — M5416 Radiculopathy, lumbar region: Secondary | ICD-10-CM | POA: Diagnosis not present

## 2020-08-30 DIAGNOSIS — H3554 Dystrophies primarily involving the retinal pigment epithelium: Secondary | ICD-10-CM | POA: Diagnosis not present

## 2020-08-31 ENCOUNTER — Other Ambulatory Visit: Payer: Self-pay | Admitting: Family Medicine

## 2020-08-31 DIAGNOSIS — E1169 Type 2 diabetes mellitus with other specified complication: Secondary | ICD-10-CM

## 2020-08-31 DIAGNOSIS — I701 Atherosclerosis of renal artery: Secondary | ICD-10-CM

## 2020-08-31 DIAGNOSIS — E785 Hyperlipidemia, unspecified: Secondary | ICD-10-CM

## 2020-09-01 ENCOUNTER — Other Ambulatory Visit: Payer: Self-pay

## 2020-09-01 ENCOUNTER — Ambulatory Visit
Admission: RE | Admit: 2020-09-01 | Discharge: 2020-09-01 | Disposition: A | Discharge status: Home / Self Care | Payer: Medicare Other | Source: Ambulatory Visit | Attending: Family Medicine | Admitting: Family Medicine

## 2020-09-01 DIAGNOSIS — M5416 Radiculopathy, lumbar region: Secondary | ICD-10-CM | POA: Diagnosis not present

## 2020-09-01 DIAGNOSIS — M9903 Segmental and somatic dysfunction of lumbar region: Secondary | ICD-10-CM | POA: Diagnosis not present

## 2020-09-01 DIAGNOSIS — Z1231 Encounter for screening mammogram for malignant neoplasm of breast: Secondary | ICD-10-CM

## 2020-09-01 DIAGNOSIS — M9901 Segmental and somatic dysfunction of cervical region: Secondary | ICD-10-CM | POA: Diagnosis not present

## 2020-09-01 DIAGNOSIS — M5033 Other cervical disc degeneration, cervicothoracic region: Secondary | ICD-10-CM | POA: Diagnosis not present

## 2020-09-13 DIAGNOSIS — E1159 Type 2 diabetes mellitus with other circulatory complications: Secondary | ICD-10-CM | POA: Diagnosis not present

## 2020-09-13 DIAGNOSIS — E1165 Type 2 diabetes mellitus with hyperglycemia: Secondary | ICD-10-CM | POA: Diagnosis not present

## 2020-09-13 DIAGNOSIS — E1169 Type 2 diabetes mellitus with other specified complication: Secondary | ICD-10-CM | POA: Diagnosis not present

## 2020-09-13 DIAGNOSIS — E559 Vitamin D deficiency, unspecified: Secondary | ICD-10-CM | POA: Diagnosis not present

## 2020-09-13 DIAGNOSIS — E785 Hyperlipidemia, unspecified: Secondary | ICD-10-CM | POA: Diagnosis not present

## 2020-09-13 DIAGNOSIS — I152 Hypertension secondary to endocrine disorders: Secondary | ICD-10-CM | POA: Diagnosis not present

## 2020-09-13 DIAGNOSIS — M810 Age-related osteoporosis without current pathological fracture: Secondary | ICD-10-CM | POA: Diagnosis not present

## 2020-09-13 DIAGNOSIS — Z794 Long term (current) use of insulin: Secondary | ICD-10-CM | POA: Diagnosis not present

## 2020-09-13 LAB — HEMOGLOBIN A1C: Hemoglobin A1C: 6.5

## 2020-09-15 DIAGNOSIS — M5033 Other cervical disc degeneration, cervicothoracic region: Secondary | ICD-10-CM | POA: Diagnosis not present

## 2020-09-15 DIAGNOSIS — M9903 Segmental and somatic dysfunction of lumbar region: Secondary | ICD-10-CM | POA: Diagnosis not present

## 2020-09-15 DIAGNOSIS — M9901 Segmental and somatic dysfunction of cervical region: Secondary | ICD-10-CM | POA: Diagnosis not present

## 2020-09-15 DIAGNOSIS — M5416 Radiculopathy, lumbar region: Secondary | ICD-10-CM | POA: Diagnosis not present

## 2020-09-21 DIAGNOSIS — M810 Age-related osteoporosis without current pathological fracture: Secondary | ICD-10-CM | POA: Diagnosis not present

## 2020-09-23 ENCOUNTER — Other Ambulatory Visit: Payer: Self-pay | Admitting: Cardiovascular Disease

## 2020-09-23 DIAGNOSIS — I10 Essential (primary) hypertension: Secondary | ICD-10-CM

## 2020-09-23 NOTE — Telephone Encounter (Signed)
Please review for refill at appointment with Dr. Rockey Situ 09/26/20. Thanks!

## 2020-09-25 NOTE — Progress Notes (Signed)
Cardiology Office Note  Date:  09/26/2020   ID:  Bonnie Rowland Feb 01, 1941, MRN [id number]  PCP:  Steele Sizer, MD   Chief Complaint  Patient presents with  . Other    12 month follow up - Patient c.o some pain under left breast. Patient states she is still walking every morning but feels she is out of shape. Meds reviewed verbally with patient.     HPI:  Bonnie Rowland is a very pleasant 80 y.o. woman with a history of chest pain,  normal coronary arteries by cardiac catheterization April 2007 ,  < 50%  renal artery disease,  check 2021 Carotid 2017 <1-39 b/l hyperlipidemia,  diabetes,  hypertension,  obesity  F/u of for atrial flutter, status post right total knee replacement surgery  LOV 08/2019  Sedentary, some walking basis Perhaps slight increase in shortness of breath, figured it was from wearing a mask  New atrial fibrillation on today's visit Was unaware of any rhythm EKG November 2021 with normal sinus rhythm  Lab work reviewed A1C 6.5 Total chol 130  CT ABD/pelvis: Images pulled up and reviewed Mild aortic atherosclerosis  Continues on her metoprolol 25 daily Pressure stable at home  Lab work reviewed in detail Total Chol 129/ LDL 57 HBA1C 6.5  Renal artery stable 1-59 on the lef Ultrasound reviewed  Carotid ultrasound reviewed from 2017, less than 39% disease bilaterally  EKG personally reviewed by myself on todays visit Shows atrial fibrillation with ventricular rate 91 bpm nonspecific ST abnormality  Other past medical history reviewed 02/21/15 she presented to [facility] for right total knee replacement by Dr. Marry Guan. She reported developing tachycardia that morning. She had extreme pain, was rushing to get to the hospital. EKG showed atrial flutter with ventricular rate 140 bpm. She was sent to the emergency room, 3 hours later was back in normal sinus rhythm. No medications were given to restore normal sinus rhythm.  echocardiogram in  May 2006 which was essentially normal with ejection fraction 60%.   Stress test in 06, 03 and 01 had been normal.  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  PSH:    Past Surgical History:  Procedure Laterality Date  . APPENDECTOMY    . CARDIAC CATHETERIZATION    . cataract surgery    . COLONOSCOPY    . COLONOSCOPY WITH PROPOFOL N/A 05/09/2016   Procedure: COLONOSCOPY WITH PROPOFOL;  Surgeon: Robert Bellow, MD;  Location[facility] END[facility]COPY;  Service: Endoscopy;  Laterality: N/A;  . DIAGNOSTIC MAMMOGRAM    . EYE SURGERY Bilateral    Cataract Extraction  . JOINT REPLACEMENT  2016  . KNEE ARTHROPLASTY Right 03/30/2015   Procedure: COMPUTER ASSISTED TOTAL KNEE ARTHROPLASTY;  Surgeon: Dereck Leep, MD;  Location: [facility] ORS;  Service: Orthopedics;  Laterality: Right;  . KNEE ARTHROSCOPY Right   . KNEE CLOSED REDUCTION Right 05/23/2015   Procedure: CLOSED MANIPULATION KNEE;  Surgeon: Dereck Leep, MD;  Location: [facility] ORS;  Service: Orthopedics;  Laterality: Right;  . TONSILLECTOMY      Current Outpatient  Medications  Medication Sig Dispense Refill  . acetaminophen (TYLENOL) 500 MG tablet Take 500 mg by mouth every 6 (six) hours as needed. Takes 1300mg  at bedtime with Zanaflex    . ALFALFA PO Take 3 tablets by mouth every morning.    Marland Kitchen aspirin 81 MG EC tablet Take 81 mg by mouth at bedtime.    . B  Complex-C (B-COMPLEX WITH VITAMIN C) tablet Take 1 tablet by mouth at bedtime.    . BD PEN NEEDLE NANO U/F 32G X 4 MM MISC USE 4 TIMES DAILY (HUMALOG 3 TIMES DAILY AND LANTUS ONCE DAILY) OFFICE NOTIFIED 08/06/17 90 each 1  . BLACK ELDERBERRY,BERRY-FLOWER, PO Take by mouth.    . calcium carbonate (OS-CAL) 600 MG TABS tablet Take 1,000 mg by mouth daily.     . cholecalciferol (VITAMIN D) 1000 UNITS tablet Take 1,000 Units by mouth daily.     . clobetasol ointment (TEMOVATE) 9.56 % APPLY 1 APPLICATION TOPICALLY 2 (TWO) TIMES DAILY AS NEEDED (BUG BITES). 30 g 0  . Coenzyme Q10 (COQ10) 200 MG CAPS Take 1 tablet by mouth daily. 100 mg every am.    . Continuous Blood Gluc Sensor ([place]) MISC by Does not apply route.    Regino Schultze Bandages & Supports (MEDICAL COMPRESSION STOCKINGS) MISC 1 each by Does not apply route daily. 2 each 5  . estradiol (ESTRACE VAGINAL) 0.1 MG/GM vaginal cream 1/2 gm once weekly using applicator, apply blueberry sized amount of cream using tip of finger to urethra twice weekly 30 g 3  . Exenatide ER (BYDUREON BCISE) 2 MG/0.85ML AUIJ Inject into the skin.    . Eyelid Cleansers (AVENOVA) 0.01 % SOLN See admin instructions.  3  . fish oil-omega-3 fatty acids 1000 MG capsule Take 500 mg by mouth daily. Krill Oil    . fluticasone (FLONASE) 50 MCG/ACT nasal spray PLACE 2 SPRAYS INTO BOTH NOSTRILS DAILY. 8.5 g 2  . GARLIC 3875 PO Take 1 tablet by mouth daily. 1 gram    . Glucosamine 500 MG TABS Take 1 tablet by mouth daily.    Marland Kitchen HUMALOG KWIKPEN 100 UNIT/ML KiwkPen Inject 6 Units into the skin 3 (three) times daily after meals. Pt taking before meals per Dr. Honor Junes, sometimes just twice Maximum 11 units per patient  3  . Insulin Glargine (LANTUS SOLOSTAR) 100 UNIT/ML Solostar Pen Inject 24 Units into the skin every morning. 15 pen 2  . losartan-hydrochlorothiazide (HYZAAR) 100-25 MG tablet Take 1 tablet by mouth in the morning. 90 tablet 1  . Mag  Aspart-Potassium Aspart (POTASSIUM & MAGNESIUM ASPARTAT PO) Take 1 tablet by mouth daily.    . metoprolol succinate (TOPROL-XL) 25 MG 24 hr tablet Take 1 tablet (25 mg total) by mouth daily. 90 tablet 3  . Multiple Vitamins-Minerals (PRESERVISION AREDS 2) CAPS Take 1 capsule by mouth 2 (two) times daily.    Marland Kitchen nystatin cream (MYCOSTATIN) Apply 1 application topically 2 (two) times daily. 60 g 2  . Probiotic Product (PROBIOTIC PO) Take 1 Dose by mouth at bedtime.    Marland Kitchen PROLIA 60 MG/ML SOSY injection     . Propylene Glycol (SYSTANE BALANCE OP) Apply 1 drop to eye as needed.    . rosuvastatin (CRESTOR) 10 MG tablet TAKE 1 TABLET (10 MG TOTAL) BY MOUTH 4 (FOUR) TIMES A WEEK. 48 tablet 1  . sodium fluoride (FLUORISHIELD) 1.1 % GEL dental gel     . sulfacetamide (BLEPH-10) 10 % ophthalmic solution Place 1  drop into both eyes 3 (three) times daily as needed.    Marland Kitchen tiZANidine (ZANAFLEX) 2 MG tablet Take 1 tablet (2 mg total) by mouth every 6 (six) hours as needed for muscle spasms. 120 tablet 1  . Vitamin D, Ergocalciferol, (DRISDOL) 1.25 MG (50000 UT) CAPS capsule Take 1 capsule by mouth once a week.    . vitamin E 180 MG (400 UNITS) capsule Take 400 Units by mouth daily as needed. Every am     No current facility-administered medications for this visit.     Allergies:   Cyclobenzaprine, Cheese, Ciprofloxacin hcl, Coconut oil, and Keflex [cephalexin]   Social History:  The patient  reports that she has never smoked. She has never used smokeless tobacco. She reports current alcohol use of about 1.0 standard drink of alcohol per week. She reports that she does not use drugs.   Family History:   family history includes Breast cancer (age of onset: 40) in her maternal aunt; Cancer in her father and mother; Colon cancer in her son; Diabetes in her mother; Hypertension in her father and mother; Kidney disease in her mother.    Review of Systems: Review of Systems  Constitutional: Negative.   Eyes:  Negative.   Respiratory: Positive for shortness of breath.   Cardiovascular: Negative.   Gastrointestinal: Negative.   Genitourinary: Negative.   Musculoskeletal: Positive for joint pain.  Neurological: Negative.   Psychiatric/Behavioral: Negative.   All other systems reviewed and are negative.   PHYSICAL EXAM: VS:  BP 126/76 (BP Location: Left Arm, Patient Position: Sitting, Cuff Size: Normal)   Pulse 91   Ht 5\' 3"  (1.6 m)   Wt 198 lb (89.8 kg)   SpO2 97%   BMI 35.07 kg/m  , BMI Body mass index is 35.07 kg/m. Constitutional:  oriented to person, place, and time. No distress.  HENT:  Head: Grossly normal Eyes:  no discharge. No scleral icterus.  Neck: No JVD, no carotid bruits  Cardiovascular: Irregularly irregular no murmurs appreciated Pulmonary/Chest: Clear to auscultation bilaterally, no wheezes or rails Abdominal: Soft.  no distension.  no tenderness.  Musculoskeletal: Normal range of motion Neurological:  normal muscle tone. Coordination normal. No atrophy Skin: Skin warm and dry Psychiatric: normal affect, pleasant   Recent Labs: 05/13/2020: ALT 22; BUN 15; Creatinine, Ser 0.58; Hemoglobin 12.5; Platelets 202; Potassium 3.5; Sodium 137    Lipid Panel Lab Results  Component Value Date   CHOL 130 07/12/2020   HDL 75 07/12/2020   LDLCALC 38 07/12/2020   TRIG 86 07/12/2020      Wt Readings from Last 3 Encounters:  09/26/20 198 lb (89.8 kg)  07/12/20 203 lb 9.6 oz (92.4 kg)  06/10/20 198 lb (89.8 kg)      ASSESSMENT AND PLAN:  New onset atrial fibrillation Long discussion with her concerning finding on EKG new since November 2021 No precipitating reason, We have recommended she increase metoprolol succinate up to 50 daily We will start Eliquis 5 twice daily Repeat echocardiogram ordered, last was in 2016 Meet again in 1 month to discuss whether to convert to normal sinus rhythm with procedure or medication In general is asymptomatic  Hyperlipidemia,  unspecified hyperlipidemia type  Cholesterol is at goal on the current lipid regimen. No changes to the medications were made.  Hypertension, benign  Increase metoprolol succinate as above up to 50 mg  Atrial flutter, unspecified type ([place])   following surgery on her knee Previously treated with flecainide, Now with new A.  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 09/26/2020  [place], [place]

## 2020-09-26 ENCOUNTER — Encounter: Payer: Self-pay | Admitting: Cardiovascular Disease

## 2020-09-26 ENCOUNTER — Other Ambulatory Visit: Payer: Self-pay

## 2020-09-26 ENCOUNTER — Ambulatory Visit (INDEPENDENT_AMBULATORY_CARE_PROVIDER_SITE_OTHER): Payer: Medicare Other | Admitting: Cardiovascular Disease

## 2020-09-26 VITALS — BP 126/76 | HR 91 | Ht 63.0 in | Wt 198.0 lb

## 2020-09-26 DIAGNOSIS — I10 Essential (primary) hypertension: Secondary | ICD-10-CM

## 2020-09-26 DIAGNOSIS — I701 Atherosclerosis of renal artery: Secondary | ICD-10-CM

## 2020-09-26 DIAGNOSIS — I4891 Unspecified atrial fibrillation: Secondary | ICD-10-CM | POA: Diagnosis not present

## 2020-09-26 DIAGNOSIS — I4892 Unspecified atrial flutter: Secondary | ICD-10-CM

## 2020-09-26 DIAGNOSIS — E782 Mixed hyperlipidemia: Secondary | ICD-10-CM

## 2020-09-26 MED ORDER — METOPROLOL SUCCINATE ER 50 MG PO TB24: 50.0000 mg | ORAL_TABLET | Freq: Every day | ORAL | 3 refills | Status: DC

## 2020-09-26 MED ORDER — APIXABAN 5 MG PO TABS: 5.0000 mg | ORAL_TABLET | Freq: Two times a day (BID) | ORAL | 11 refills | Status: DC

## 2020-09-26 NOTE — Patient Instructions (Addendum)
Medication Instructions:  Stop aspirin  START: eliquis 5 mg twice a day  3 boxes of samples  Lot [id number]  Exp: 10/2022 We are out of free trial card:   Talk to a live specialist by calling [phone]  [phone]), Monday - Friday,  8 AM - 8 PM (ET) or Saturday - Sunday, 9 AM - 6 PM (ET)  INCREASE metoprolol succinate up to 50 mg daily  Lab work: No new labs needed  Testing/Procedures: Echocardiogram for atrial fibrillation Your physician has requested that you have an echocardiogram. Echocardiography is a painless test that uses sound waves to create images of your heart. It provides your doctor with information about the size and shape of your heart and how well your heart's chambers and valves are working. This procedure takes approximately one hour. There are no restrictions for this procedure.  There is a possibility that an IV may need to be started during your test to inject an image enhancing agent. This is done to obtain more optimal pictures of your heart. Therefore we ask that you do at least drink some water prior to coming in to hydrate your veins.     Follow-Up:  . You will need a follow up appointment in 1 month  . Providers on your designated Care Team:   . Murray Hodgkins, NP . [name], [name] . Marrianne Mood, [name]

## 2020-09-29 DIAGNOSIS — M9901 Segmental and somatic dysfunction of cervical region: Secondary | ICD-10-CM | POA: Diagnosis not present

## 2020-09-29 DIAGNOSIS — M5033 Other cervical disc degeneration, cervicothoracic region: Secondary | ICD-10-CM | POA: Diagnosis not present

## 2020-09-29 DIAGNOSIS — M5416 Radiculopathy, lumbar region: Secondary | ICD-10-CM | POA: Diagnosis not present

## 2020-09-29 DIAGNOSIS — M9903 Segmental and somatic dysfunction of lumbar region: Secondary | ICD-10-CM | POA: Diagnosis not present

## 2020-10-06 ENCOUNTER — Ambulatory Visit: Payer: Medicare Other | Admitting: Podiatry

## 2020-10-13 ENCOUNTER — Ambulatory Visit: Payer: Medicare Other | Admitting: Podiatry

## 2020-10-13 DIAGNOSIS — M5416 Radiculopathy, lumbar region: Secondary | ICD-10-CM | POA: Diagnosis not present

## 2020-10-13 DIAGNOSIS — M9903 Segmental and somatic dysfunction of lumbar region: Secondary | ICD-10-CM | POA: Diagnosis not present

## 2020-10-13 DIAGNOSIS — M5033 Other cervical disc degeneration, cervicothoracic region: Secondary | ICD-10-CM | POA: Diagnosis not present

## 2020-10-13 DIAGNOSIS — M9901 Segmental and somatic dysfunction of cervical region: Secondary | ICD-10-CM | POA: Diagnosis not present

## 2020-10-17 ENCOUNTER — Encounter: Payer: Self-pay | Admitting: Podiatry

## 2020-10-17 ENCOUNTER — Ambulatory Visit (INDEPENDENT_AMBULATORY_CARE_PROVIDER_SITE_OTHER): Payer: Medicare Other | Admitting: Podiatry

## 2020-10-17 ENCOUNTER — Other Ambulatory Visit: Payer: Self-pay

## 2020-10-17 DIAGNOSIS — L84 Corns and callosities: Secondary | ICD-10-CM

## 2020-10-17 DIAGNOSIS — E113299 Type 2 diabetes mellitus with mild nonproliferative diabetic retinopathy without macular edema, unspecified eye: Secondary | ICD-10-CM

## 2020-10-17 DIAGNOSIS — E1165 Type 2 diabetes mellitus with hyperglycemia: Secondary | ICD-10-CM

## 2020-10-17 DIAGNOSIS — IMO0002 Reserved for concepts with insufficient information to code with codable children: Secondary | ICD-10-CM

## 2020-10-17 NOTE — Progress Notes (Signed)
This patient returns to my office for at risk foot care.  This patient requires this care by a professional since this patient will be at risk due to having  Diabetes.  Patient has painful corn on her third toe left foot.This patient is unable to cut corn herself since the patient cannot reach her foot..This callus  is painful walking and wearing shoes.  This patient presents for at risk foot care today.   General Appearance  Alert, conversant and in no acute stress.  Vascular  Dorsalis pedis pulses  are palpable  bilaterally. Posterior tibial pulses are weakly palpable  B/L. Capillary return is within normal limits  bilaterally. Temperature is within normal limits  Bilaterally. Swelling ankles/lower legs.  Superficial veins both feet./  Neurologic  Senn-Weinstein monofilament wire test within normal limits  bilaterally. Muscle power within normal limits bilaterally.  Nails Normal nails with no evidence of fungus.. No evidence of bacterial infection or drainage bilaterally.  Orthopedic  No limitations of motion  feet .  No crepitus or effusions noted.  Contracted digits B/L with deviated fourth toes  B/L.  Skin  normotropic skin with no porokeratosis noted bilaterally.  No signs of infections or ulcers noted.  Clavi 3rd toe  Left foot.     Clavi third toe left foot.  Consent was obtained for treatment procedures. Debride clavi  Third toe was debrided with # 15 blade  left followed by dremel tool usage.  Padding dispensed.       Return office visit    4 months                 Told patient to return for periodic foot care and evaluation due to potential at risk complications.   [name] DPM

## 2020-10-20 ENCOUNTER — Ambulatory Visit (INDEPENDENT_AMBULATORY_CARE_PROVIDER_SITE_OTHER): Payer: Medicare Other

## 2020-10-20 ENCOUNTER — Other Ambulatory Visit: Payer: Self-pay

## 2020-10-20 DIAGNOSIS — I4891 Unspecified atrial fibrillation: Secondary | ICD-10-CM | POA: Diagnosis not present

## 2020-10-20 LAB — ECHOCARDIOGRAM COMPLETE
AR max vel: 1.8 cm2
AV Area VTI: 1.66 cm2
AV Area mean vel: 1.38 cm2
AV Mean grad: 5 mmHg
AV Peak grad: 7.6 mmHg
Ao pk vel: 1.38 m/s
Area-P 1/2: 2.72 cm2
MV M vel: 4.88 m/s
MV Peak grad: 95.3 mmHg
MV VTI: 1.49 cm2
S' Lateral: 2.25 cm

## 2020-10-27 DIAGNOSIS — M9901 Segmental and somatic dysfunction of cervical region: Secondary | ICD-10-CM | POA: Diagnosis not present

## 2020-10-27 DIAGNOSIS — M9903 Segmental and somatic dysfunction of lumbar region: Secondary | ICD-10-CM | POA: Diagnosis not present

## 2020-10-27 DIAGNOSIS — M5033 Other cervical disc degeneration, cervicothoracic region: Secondary | ICD-10-CM | POA: Diagnosis not present

## 2020-10-27 DIAGNOSIS — M5416 Radiculopathy, lumbar region: Secondary | ICD-10-CM | POA: Diagnosis not present

## 2020-11-01 ENCOUNTER — Telehealth: Payer: Self-pay

## 2020-11-01 NOTE — Telephone Encounter (Signed)
Able to reach pt regarding her recent echo, Dr. Rockey Situ had a chance to review her results and advised   "Echo  Normal right and left ventricle function, normal valves  Normal pressures  Excellent study"  [facility] very pleased with the call of good results, will keep her upcoming appt this Friday to be seen in office to get another EKG to see if her heart rhythm is still afib, no symptoms at this time, reports good echo report makes her feel "more reassured". Any additional questions or concerns will will bring up at her appt Friday.

## 2020-11-02 NOTE — Progress Notes (Signed)
Cardiology Office Note  Date:  11/04/2020   ID:  Bonnie Rowland, Bonnie Rowland 1941/07/02, MRN [id number]  PCP:  Bonnie Sizer, MD   Chief Complaint  Patient presents with  . 1 month follow up     Patient c/o shortness of breath. Medications reviewed by the patient verbally.     HPI:  Bonnie Rowland is a very pleasant 80 y.o. woman with a history of chest pain,  normal coronary arteries by cardiac catheterization April 2007 ,  < 50%  renal artery disease,  check 2021 Carotid 2017 <1-39 b/l hyperlipidemia,  diabetes,  hypertension,  obesity  F/u for atrial flutter, atrial fib  Presents today with her husband for further discussion of her atrial fibrillation Last office visit 1 month ago was in atrial fibrillation, asymptomatic Metoprolol dose was increased Echocardiogram October 20, 2020 normal ejection fraction normal right heart pressures mild MR  Stress: grand daughter with MVA  Gained 20 pounds,  Wants to start exercise program Sedentary at baseline Mild shortness of breath, nothing significant In general is relatively asymptomatic from her atrial fibrillation  On clinic visit 1 month ago was unaware she was in atrial fibrillation EKG November 2021 with normal sinus rhythm  Lab work reviewed A1C 6.5 Total chol 130  CT ABD/pelvis: Images pulled up and reviewed Mild aortic atherosclerosis  Continues on her metoprolol 25 daily Pressure stable at home  Lab work reviewed in detail Total Chol 129/ LDL 57 HBA1C 6.5  Prior cardiac studies Renal artery stable 1-59 on the left Carotid ultrasound reviewed from 2017, less than 39% disease bilaterally  EKG personally reviewed by myself on todays visit Shows atrial fibrillation with ventricular rate 87 bpm nonspecific ST abnormality  Other past medical history reviewed 02/21/15 she presented to [facility] for right total knee replacement by [facility]. She reported developing tachycardia that morning. She had extreme pain, was  rushing to get to the [facility]. EKG showed atrial flutter with ventricular rate 140 bpm. She was sent to the emergency room, 3 hours later was back in normal sinus rhythm. No medications were given to restore normal sinus rhythm.  echocardiogram in May 2006 which was essentially normal with ejection fraction 60%.   Stress test in 06, 03 and 01 had been normal.  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  PSH:    Past Surgical History:  Procedure Laterality Date  . APPENDECTOMY    . CARDIAC CATHETERIZATION    . cataract surgery    . COLONOSCOPY    . COLONOSCOPY WITH PROPOFOL N/A 05/09/2016   Procedure: COLONOSCOPY WITH PROPOFOL;  Surgeon: Bonnie Bellow, MD;  Location: [facility];  Service: Endoscopy;  Laterality: N/A;  . DIAGNOSTIC MAMMOGRAM    . EYE SURGERY Bilateral    Cataract Extraction  . JOINT REPLACEMENT  2016  . KNEE ARTHROPLASTY Right 03/30/2015   Procedure: COMPUTER ASSISTED TOTAL KNEE ARTHROPLASTY;  Surgeon: Bonnie Leep, MD;  Location: [facility];  Service: Orthopedics;  Laterality: Right;  . KNEE ARTHROSCOPY Right   . KNEE CLOSED REDUCTION Right 05/23/2015  Procedure: CLOSED MANIPULATION KNEE;  Surgeon: Bonnie Leep, MD;  Location: [facility];  Service: Orthopedics;  Laterality: Right;  . TONSILLECTOMY      Current Outpatient Medications  Medication Sig Dispense Refill  .  acetaminophen (TYLENOL) 500 MG tablet Take 500 mg by mouth every 6 (six) hours as needed. Takes 1300mg  at bedtime with Zanaflex    . ALFALFA PO Take 3 tablets by mouth every morning.    Marland Kitchen apixaban (ELIQUIS) 5 MG TABS tablet Take 1 tablet (5 mg total) by mouth 2 (two) times daily. 30 tablet 11  . aspirin 81 MG EC tablet Take 81 mg by mouth at bedtime.    . B Complex-C (B-COMPLEX WITH VITAMIN C) tablet Take 1 tablet by mouth at bedtime.    . BD PEN NEEDLE NANO U/F 32G X 4 MM MISC USE 4 TIMES DAILY ([facility] 3 TIMES DAILY AND LANTUS ONCE DAILY) OFFICE NOTIFIED 08/06/17 90 each 1  . BLACK ELDERBERRY,BERRY-FLOWER, PO Take by mouth.    . calcium carbonate (OS-CAL) 600 MG TABS tablet Take 1,000 mg by mouth daily.     . cholecalciferol (VITAMIN D) 1000 UNITS tablet Take 1,000 Units by mouth daily.     . clobetasol ointment (TEMOVATE) 0.16 % APPLY 1 APPLICATION TOPICALLY 2 (TWO) TIMES DAILY AS NEEDED (BUG BITES). 30 g 0  . Coenzyme Q10 (COQ10) 200 MG CAPS Take 1 tablet by mouth daily. 100 mg every am.    . Continuous Blood Gluc Sensor (Hagerman) MISC by Does not apply route.    [facility] Bandages & Supports (MEDICAL COMPRESSION STOCKINGS) MISC 1 each by Does not apply route daily. 2 each 5  . estradiol (ESTRACE VAGINAL) 0.1 MG/GM vaginal cream 1/2 gm once weekly using applicator, apply blueberry sized amount of cream using tip of finger to urethra twice weekly 30 g 3  . Exenatide ER (BYDUREON BCISE) 2 MG/0.85ML AUIJ Inject into the skin.    . Eyelid Cleansers (AVENOVA) 0.01 % SOLN See admin instructions.  3  . fish oil-omega-3 fatty acids 1000 MG capsule Take 500 mg by mouth daily. Krill Oil    . fluticasone (FLONASE) 50 MCG/ACT nasal spray PLACE 2 SPRAYS INTO BOTH NOSTRILS DAILY. 8.5 g 2  . GARLIC 0109 PO Take 1 tablet by mouth daily. 1 gram    . Glucosamine 500 MG TABS Take 1 tablet by mouth daily.    Marland Kitchen [facility] KWIKPEN 100 UNIT/ML KiwkPen Inject 6 Units into the skin 3 (three) times  daily after meals. Pt taking before meals per [facility], sometimes just twice Maximum 11 units per patient  3  . Insulin Glargine (LANTUS SOLOSTAR) 100 UNIT/ML Solostar Pen Inject 24 Units into the skin every morning. 15 pen 2  . losartan-hydrochlorothiazide (HYZAAR) 100-25 MG tablet Take 1 tablet by mouth in the morning. 90 tablet 1  . Mag Aspart-Potassium Aspart (POTASSIUM & MAGNESIUM ASPARTAT PO) Take 1 tablet by mouth daily.    . metoprolol succinate (TOPROL-XL) 50 MG 24 hr tablet Take 1 tablet (50 mg total) by mouth daily. Take with or immediately following a meal. 90 tablet 3  . Multiple Vitamins-Minerals (PRESERVISION AREDS 2) CAPS Take 1 capsule by mouth 2 (two) times daily.    Marland Kitchen nystatin cream (MYCOSTATIN) Apply 1 application topically 2 (two) times daily. 60 g 2  . Probiotic Product (PROBIOTIC PO) Take 1 Dose by mouth at bedtime.    Marland Kitchen PROLIA 60 MG/ML SOSY injection     .  Propylene Glycol (SYSTANE BALANCE OP) Apply 1 drop to eye as needed.    . rosuvastatin (CRESTOR) 10 MG tablet TAKE 1 TABLET (10 MG TOTAL) BY MOUTH 4 (FOUR) TIMES A WEEK. 48 tablet 1  . sodium fluoride (FLUORISHIELD) 1.1 % GEL dental gel     . sulfacetamide (BLEPH-10) 10 % ophthalmic solution Place 1 drop into both eyes 3 (three) times daily as needed.    Marland Kitchen tiZANidine (ZANAFLEX) 2 MG tablet TAKE 1 TABLET BY MOUTH EVERY 6 HOURS AS NEEDED FOR MUSCLE SPASMS. 120 tablet 1  . Vitamin D, Ergocalciferol, (DRISDOL) 1.25 MG (50000 UT) CAPS capsule Take 1 capsule by mouth once a week.    . vitamin E 180 MG (400 UNITS) capsule Take 400 Units by mouth daily as needed. Every am     No current facility-administered medications for this visit.     Allergies:   Cyclobenzaprine, Cheese, Ciprofloxacin hcl, Coconut oil, and Keflex [cephalexin]   Social History:  The patient  reports that she has never smoked. She has never used smokeless tobacco. She reports current alcohol use of about 1.0 standard drink of alcohol per week. She  reports that she does not use drugs.   Family History:   family history includes Breast cancer (age of onset: 72) in her maternal aunt; Cancer in her father and mother; Colon cancer in her son; Diabetes in her mother; Hypertension in her father and mother; Kidney disease in her mother.    Review of Systems: Review of Systems  Constitutional: Negative.   Eyes: Negative.   Respiratory: Positive for shortness of breath.   Cardiovascular: Negative.   Gastrointestinal: Negative.   Genitourinary: Negative.   Musculoskeletal: Positive for joint pain.  Neurological: Negative.   Psychiatric/Behavioral: Negative.   All other systems reviewed and are negative.   PHYSICAL EXAM: VS:  BP (!) 150/80 (BP Location: Left Arm, Patient Position: Sitting, Cuff Size: Large)   Pulse 87   Ht 5\' 3"  (1.6 m)   Wt 196 lb 6 oz (89.1 kg)   SpO2 98%   BMI 34.79 kg/m  , BMI Body mass index is 34.79 kg/m. Constitutional:  oriented to person, place, and time. No distress.  HENT:  Head: Grossly normal Eyes:  no discharge. No scleral icterus.  Neck: No JVD, no carotid bruits  Cardiovascular: Irregularly irregular,  no murmurs appreciated Pulmonary/Chest: Clear to auscultation bilaterally, no wheezes or rails Abdominal: Soft.  no distension.  no tenderness.  Musculoskeletal: Normal range of motion Neurological:  normal muscle tone. Coordination normal. No atrophy Skin: Skin warm and dry Psychiatric: normal affect, pleasant  Recent Labs: 05/13/2020: ALT 22; BUN 15; Creatinine, Ser 0.58; Hemoglobin 12.5; Platelets 202; Potassium 3.5; Sodium 137    Lipid Panel Lab Results  Component Value Date   CHOL 130 07/12/2020   HDL 75 07/12/2020   LDLCALC 38 07/12/2020   TRIG 86 07/12/2020      Wt Readings from Last 3 Encounters:  11/04/20 196 lb 6 oz (89.1 kg)  09/26/20 198 lb (89.8 kg)  07/12/20 203 lb 9.6 oz (92.4 kg)     ASSESSMENT AND PLAN:  New onset atrial fibrillation Long discussion with  patient and her husband concerning various treatment options for her atrial fibrillation -Discussed rate control versus rhythm control Discussed cardioversion, risk and benefit -For now recommend that she continue Eliquis 5 twice daily Metoprolol succinate up to 75 daily -Lasix 20 mg as needed for any leg swelling abdominal bloating shortness of breath -She will  think about cardioversion with her husband and let our office know if she would like to proceed -Potentially would do well with rate control only given her sedentary lifestyle In general has been asymptomatic  Hyperlipidemia, unspecified hyperlipidemia type  Cholesterol is at goal on the current lipid regimen. No changes to the medications were made. Lifestyle modification recommended  Hypertension, benign  Increase metoprolol succinate up to 75 mg daily for blood pressure and rate control  Atrial flutter, unspecified type (HCC)   following surgery on her knee Previously treated with flecainide, Now with new A. 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 11/04/2020  [facility] [place], [place] [phone]

## 2020-11-03 ENCOUNTER — Other Ambulatory Visit: Payer: Self-pay | Admitting: Family Medicine

## 2020-11-03 DIAGNOSIS — M62830 Muscle spasm of back: Secondary | ICD-10-CM

## 2020-11-03 NOTE — Telephone Encounter (Signed)
Requested medication (s) are due for refill today: yes  Requested medication (s) are on the active medication list: yes  Last refill: 10/11/2020  Future visit scheduled: yes  Notes to clinic:  this refill cannot be delegated    Requested Prescriptions  Pending Prescriptions Disp Refills   tiZANidine (ZANAFLEX) 2 MG tablet [Pharmacy Med Name: TIZANIDINE HCL 2 MG TABLET] 120 tablet 1    Sig: TAKE 1 TABLET BY MOUTH EVERY 6 HOURS AS NEEDED FOR MUSCLE SPASMS.      Not Delegated - Cardiovascular:  Alpha-2 Agonists - tizanidine Failed - 11/03/2020  2:31 PM      Failed - This refill cannot be delegated      Passed - Valid encounter within last 6 months    Recent Outpatient Visits           3 months ago Diabetes mellitus type 2 in obese [facility])   [facility] Steele Sizer, MD   8 months ago [facility] Steele Sizer, MD   10 months ago Atherosclerosis of renal artery [facility])   [facility] Steele Sizer, MD   11 months ago Acute left-sided low back pain without sciatica   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

## 2020-11-04 ENCOUNTER — Other Ambulatory Visit: Payer: Self-pay

## 2020-11-04 ENCOUNTER — Encounter: Payer: Self-pay | Admitting: Cardiovascular Disease

## 2020-11-04 ENCOUNTER — Ambulatory Visit (INDEPENDENT_AMBULATORY_CARE_PROVIDER_SITE_OTHER): Payer: Medicare Other | Admitting: Cardiovascular Disease

## 2020-11-04 VITALS — BP 150/80 | HR 87 | Ht 63.0 in | Wt 196.4 lb

## 2020-11-04 DIAGNOSIS — I4819 Other persistent atrial fibrillation: Secondary | ICD-10-CM

## 2020-11-04 DIAGNOSIS — I701 Atherosclerosis of renal artery: Secondary | ICD-10-CM

## 2020-11-04 DIAGNOSIS — I4892 Unspecified atrial flutter: Secondary | ICD-10-CM | POA: Diagnosis not present

## 2020-11-04 DIAGNOSIS — E782 Mixed hyperlipidemia: Secondary | ICD-10-CM

## 2020-11-04 DIAGNOSIS — I10 Essential (primary) hypertension: Secondary | ICD-10-CM | POA: Diagnosis not present

## 2020-11-04 MED ORDER — FUROSEMIDE 20 MG PO TABS
20.0000 mg | ORAL_TABLET | Freq: Every day | ORAL | 3 refills | Status: DC | PRN
Start: 2020-11-04 — End: 2021-01-16

## 2020-11-04 MED ORDER — METOPROLOL SUCCINATE ER 50 MG PO TB24: 50.0000 mg | ORAL_TABLET | Freq: Every day | ORAL | 1 refills | Status: DC

## 2020-11-04 MED ORDER — METOPROLOL SUCCINATE ER 25 MG PO TB24: 25.0000 mg | ORAL_TABLET | Freq: Every day | ORAL | 1 refills | Status: DC

## 2020-11-04 MED ORDER — METOPROLOL SUCCINATE ER 50 MG PO TB24: 75.0000 mg | ORAL_TABLET | Freq: Every day | ORAL | 2 refills | Status: DC

## 2020-11-04 NOTE — Patient Instructions (Addendum)
Lasix 20 mg as needed for leg swelling, shortness of breath  Medication Instructions:  Please increase the metoprolol up to 75 mg daily  If you need a refill on your cardiac medications before your next appointment, please call your pharmacy.    Lab work: No new labs needed   If you have labs (blood work) drawn today and your tests are completely normal, you will receive your results only by: Marland Kitchen MyChart Message (if you have MyChart) OR . A paper copy in the mail If you have any lab test that is abnormal or we need to change your treatment, we will call you to review the results.   Testing/Procedures: No new testing needed   Follow-Up: At [facility], you and your health needs are our priority.  As part of our continuing mission to provide you with exceptional heart care, we have created designated Provider Care Teams.  These Care Teams include your primary Cardiologist (physician) and Advanced Practice Providers (APPs -  Physician Assistants and Nurse Practitioners) who all work together to provide you with the care you need, when you need it.  . You will need a follow up appointment in 3 months  . Providers on your designated Care Team:   . Murray Hodgkins, NP . [name], [name] . Marrianne Mood, [name]  Any Other Special Instructions Will Be Listed Below (If Applicable).  COVID-19 Vaccine Information can be found at: ShippingScam.co.uk For questions related to vaccine distribution or appointments, please email vaccine@[email or web address] .com or call 9341465480.

## 2020-11-10 DIAGNOSIS — M5033 Other cervical disc degeneration, cervicothoracic region: Secondary | ICD-10-CM | POA: Diagnosis not present

## 2020-11-10 DIAGNOSIS — M9901 Segmental and somatic dysfunction of cervical region: Secondary | ICD-10-CM | POA: Diagnosis not present

## 2020-11-10 DIAGNOSIS — M9903 Segmental and somatic dysfunction of lumbar region: Secondary | ICD-10-CM | POA: Diagnosis not present

## 2020-11-10 DIAGNOSIS — M5416 Radiculopathy, lumbar region: Secondary | ICD-10-CM | POA: Diagnosis not present

## 2020-11-17 DIAGNOSIS — Z23 Encounter for immunization: Secondary | ICD-10-CM | POA: Diagnosis not present

## 2020-11-22 ENCOUNTER — Telehealth: Payer: Self-pay | Admitting: Cardiovascular Disease

## 2020-11-22 NOTE — Telephone Encounter (Signed)
Patient recently seen and discussed cardioversion with Dr. Rockey Situ.  She is now ready to schedule.

## 2020-11-24 DIAGNOSIS — M9903 Segmental and somatic dysfunction of lumbar region: Secondary | ICD-10-CM | POA: Diagnosis not present

## 2020-11-24 DIAGNOSIS — M5033 Other cervical disc degeneration, cervicothoracic region: Secondary | ICD-10-CM | POA: Diagnosis not present

## 2020-11-24 DIAGNOSIS — M5416 Radiculopathy, lumbar region: Secondary | ICD-10-CM | POA: Diagnosis not present

## 2020-11-24 DIAGNOSIS — M9901 Segmental and somatic dysfunction of cervical region: Secondary | ICD-10-CM | POA: Diagnosis not present

## 2020-11-28 NOTE — Telephone Encounter (Signed)
Okay to schedule cardioversion with me,  try Wednesday when I am in the hospital

## 2020-11-29 ENCOUNTER — Other Ambulatory Visit: Payer: Self-pay | Admitting: Cardiovascular Disease

## 2020-11-29 NOTE — Telephone Encounter (Signed)
Attempted to reach out to Mrs. Parkdale, unable to make contact by phone. LDM on VM (DPR approved) advised Dr. Rockey Situ okay cardioversion, would be best for Wed, June 1 at 08:00am and arrival time at 07:00am at [facility] for check-in. Need pt to call back for confirmation.  Date and time confirmed by anesthesia, pt has been placed on schedule. Will await pt's call back for detail instructions.

## 2020-11-29 NOTE — Telephone Encounter (Signed)
[facility] stopped by the office, went over Cardioversion instruction with her, advised to arrive at [facility] at 07:00am and her procedure would start at 08:00am.   Advised to hold fluid pill until after her procedure.  No food/drink after midnight  No caffeine for 24 hrs Have someone to drive her home after the cardioversion.  Bonnie [facility] is very thankful for the quick schedule and instructions.

## 2020-11-30 ENCOUNTER — Encounter: Payer: Self-pay | Admitting: Cardiovascular Disease

## 2020-11-30 ENCOUNTER — Other Ambulatory Visit: Payer: Self-pay | Admitting: Cardiovascular Disease

## 2020-11-30 ENCOUNTER — Ambulatory Visit: Payer: Medicare Other | Admitting: Certified Registered Nurse Anesthetist

## 2020-11-30 ENCOUNTER — Encounter
Admission: RE | Disposition: A | Discharge status: Home / Self Care | Payer: Self-pay | Source: Home / Self Care | Attending: Cardiovascular Disease

## 2020-11-30 ENCOUNTER — Ambulatory Visit
Admission: RE | Admit: 2020-11-30 | Discharge: 2020-11-30 | Disposition: A | Discharge status: Home / Self Care | Payer: Medicare Other | Attending: Cardiovascular Disease | Admitting: Cardiovascular Disease

## 2020-11-30 ENCOUNTER — Other Ambulatory Visit: Payer: Self-pay

## 2020-11-30 DIAGNOSIS — Z7901 Long term (current) use of anticoagulants: Secondary | ICD-10-CM | POA: Diagnosis not present

## 2020-11-30 DIAGNOSIS — Z881 Allergy status to other antibiotic agents status: Secondary | ICD-10-CM | POA: Insufficient documentation

## 2020-11-30 DIAGNOSIS — Z794 Long term (current) use of insulin: Secondary | ICD-10-CM | POA: Insufficient documentation

## 2020-11-30 DIAGNOSIS — E785 Hyperlipidemia, unspecified: Secondary | ICD-10-CM | POA: Diagnosis not present

## 2020-11-30 DIAGNOSIS — Z79899 Other long term (current) drug therapy: Secondary | ICD-10-CM | POA: Insufficient documentation

## 2020-11-30 DIAGNOSIS — I4819 Other persistent atrial fibrillation: Secondary | ICD-10-CM | POA: Insufficient documentation

## 2020-11-30 DIAGNOSIS — Z96651 Presence of right artificial knee joint: Secondary | ICD-10-CM | POA: Diagnosis not present

## 2020-11-30 DIAGNOSIS — Z6834 Body mass index (BMI) 34.0-34.9, adult: Secondary | ICD-10-CM | POA: Insufficient documentation

## 2020-11-30 DIAGNOSIS — Z7982 Long term (current) use of aspirin: Secondary | ICD-10-CM | POA: Insufficient documentation

## 2020-11-30 DIAGNOSIS — Z888 Allergy status to other drugs, medicaments and biological substances status: Secondary | ICD-10-CM | POA: Insufficient documentation

## 2020-11-30 DIAGNOSIS — E669 Obesity, unspecified: Secondary | ICD-10-CM | POA: Diagnosis not present

## 2020-11-30 DIAGNOSIS — I4891 Unspecified atrial fibrillation: Secondary | ICD-10-CM | POA: Diagnosis not present

## 2020-11-30 HISTORY — PX: CARDIOVERSION: SHX1299

## 2020-11-30 LAB — GLUCOSE, CAPILLARY: Glucose-Capillary: 144 mg/dL — ABNORMAL HIGH (ref 70–99)

## 2020-11-30 SURGERY — CARDIOVERSION
Anesthesia: General

## 2020-11-30 MED ORDER — SODIUM CHLORIDE 0.9 % IV SOLN: INTRAVENOUS | Status: DC

## 2020-11-30 MED ORDER — PROPOFOL 10 MG/ML IV BOLUS
INTRAVENOUS | Status: DC | PRN
  Administered 2020-11-30: 70 mg via INTRAVENOUS

## 2020-11-30 MED ORDER — METOPROLOL SUCCINATE ER 50 MG PO TB24: 50.0000 mg | ORAL_TABLET | Freq: Two times a day (BID) | ORAL | 2 refills | Status: DC

## 2020-11-30 NOTE — CV Procedure (Signed)
Cardioversion procedure note For atrial fibrillation, persistent  Procedure Details:  Consent: Risks of procedure as well as the alternatives and risks of each were explained to the (patient/caregiver). Consent for procedure obtained.  Time Out: Verified patient identification, verified procedure, site/side was marked, verified correct patient position, special equipment/implants available, medications/allergies/relevent history reviewed, required imaging and test results available. Performed  Patient placed on cardiac monitor, pulse oximetry, supplemental oxygen as necessary.  Sedation given: propofol IV, Dr. Ronelle Nigh Pacer pads placed anterior and posterior chest.   Cardioverted 2 time(s).  Cardioverted at  150J, 200J. Synchronized biphasic Converted to NSR   Evaluation: Findings: Post procedure EKG shows: NSR Complications: None Patient did tolerate procedure well.  Time Spent Directly with the Patient:  54 minutes   Esmond Plants, M.D., Ph.D.

## 2020-11-30 NOTE — Anesthesia Preprocedure Evaluation (Signed)
Anesthesia Evaluation  Patient identified by MRN, date of birth, ID band Patient awake    Reviewed: Allergy & Precautions, NPO status , Patient's Chart, lab work & pertinent test results  History of Anesthesia Complications Negative for: history of anesthetic complications  Airway Mallampati: II       Dental   Pulmonary neg sleep apnea, neg COPD, Not current smoker,           Cardiovascular hypertension, Pt. on medications (-) Past MI and (-) CHF + dysrhythmias Atrial Fibrillation + Valvular Problems/Murmurs (murmur, no tx)      Neuro/Psych neg Seizures    GI/Hepatic Neg liver ROS, GERD  Controlled,  Endo/Other  diabetes, Type 2, Oral Hypoglycemic Agents  Renal/GU negative Renal ROS     Musculoskeletal   Abdominal   Peds  Hematology   Anesthesia Other Findings   Reproductive/Obstetrics                             Anesthesia Physical Anesthesia Plan  ASA: III  Anesthesia Plan: General   Post-op Pain Management:    Induction: Intravenous  PONV Risk Score and Plan: 3 and Propofol infusion and TIVA  Airway Management Planned: Nasal Cannula  Additional Equipment:   Intra-op Plan:   Post-operative Plan:   Informed Consent: I have reviewed the patients History and Physical, chart, labs and discussed the procedure including the risks, benefits and alternatives for the proposed anesthesia with the patient or authorized representative who has indicated his/her understanding and acceptance.       Plan Discussed with:   Anesthesia Plan Comments:         Anesthesia Quick Evaluation

## 2020-11-30 NOTE — Anesthesia Postprocedure Evaluation (Signed)
Anesthesia Post Note  Patient: Bonnie [facility]  Procedure(s) Performed: CARDIOVERSION (N/A )  Patient location during evaluation: Specials Recovery Anesthesia Type: General Level of consciousness: awake and alert Pain management: pain level controlled Vital Signs Assessment: post-procedure vital signs reviewed and stable Respiratory status: spontaneous breathing and respiratory function stable Cardiovascular status: stable Anesthetic complications: no   No complications documented.   Last Vitals:  Vitals:   11/30/20 0801 11/30/20 0802  BP:    Pulse: (!) 56 (!) 47  Resp: (!) 22 16  Temp:    SpO2: 96% 97%    Last Pain:  Vitals:   11/30/20 0740  TempSrc: Oral  PainSc: 0-No pain                 Safwan Tomei K

## 2020-11-30 NOTE — Transfer of Care (Signed)
Immediate Anesthesia Transfer of Care Note  Patient: Bonnie [facility]  Procedure(s) Performed: CARDIOVERSION (N/A )  Patient Location: PACU  Anesthesia Type:General  Level of Consciousness: awake and alert   Airway & Oxygen Therapy: Patient Spontanous Breathing and Patient connected to nasal cannula oxygen  Post-op Assessment: Report given to RN and Post -op Vital signs reviewed and stable  Post vital signs: Reviewed and stable  Last Vitals:  Vitals Value Taken Time  BP 129/55 11/30/20 0800  Temp    Pulse 57 11/30/20 0802  Resp 24 11/30/20 0802  SpO2 98 % 11/30/20 0802  Vitals shown include unvalidated device data.  Last Pain:  Vitals:   11/30/20 0740  TempSrc: Oral  PainSc: 0-No pain      Patients Stated Pain Goal: 0 (28/76/81 1572)  Complications: No complications documented.

## 2020-12-02 ENCOUNTER — Other Ambulatory Visit: Payer: Self-pay | Admitting: Family Medicine

## 2020-12-02 DIAGNOSIS — M62830 Muscle spasm of back: Secondary | ICD-10-CM

## 2020-12-05 NOTE — H&P (Addendum)
H&P Addendum, pre-cardioversion  Patient was seen and evaluated prior to -cardioversion procedure Symptoms, prior testing details again confirmed with the patient Patient examined, no significant change from prior exam Lab work reviewed in detail personally by myself Patient understands risk and benefit of the procedure, willing to proceed  Signed, Esmond Plants, MD, Ph.D [facility]   Cardiology Office Note  Date:  11/04/2020   ID:  Bonnie Rowland, Bonnie Rowland [id number], MRN [id number]  PCP:  Steele Sizer, MD          Chief Complaint  Patient presents with  . 1 month follow up     Patient c/o shortness of breath. Medications reviewed by the patient verbally.     HPI:  Bonnie Rowland is a very pleasant 80 y.o. woman with a history of chest pain,  normal coronary arteries by cardiac catheterization April 2007 ,  <50% renal artery disease, check 2021 Carotid 2017 <1-39 b/l hyperlipidemia,  diabetes,  hypertension,  obesity  F/u for atrial flutter, atrial fib  Presents today with her husband for further discussion of her atrial fibrillation Last office visit 1 month ago was in atrial fibrillation, asymptomatic Metoprolol dose was increased Echocardiogram October 20, 2020 normal ejection fraction normal right heart pressures mild MR  Stress: grand daughter with MVA  Gained 20 pounds,  Wants to start exercise program Sedentary at baseline Mild shortness of breath, nothing significant In general is relatively asymptomatic from her atrial fibrillation  On clinic visit 1 month ago was unaware she was in atrial fibrillation EKG November 2021 with normal sinus rhythm  Lab work reviewed A1C 6.5 Total chol 130  CT ABD/pelvis: Images pulled up and reviewed Mild aortic atherosclerosis  Continues on her metoprolol 25 daily Pressure stable at home  Lab work reviewed in detail Total Chol 129/ LDL 57 HBA1C 6.5  Prior cardiac studies Renal artery stable  1-59 on the left Carotid ultrasound reviewed from 2017, less than 39% disease bilaterally  EKG personally reviewed by myself on todays visit Shows atrial fibrillation with ventricular rate 87 bpm nonspecific ST abnormality  Other past medical history reviewed 02/21/15 she presented to [facility] for right total knee replacement by Dr. Marry Guan. She reported developing tachycardia that morning. She had extreme pain, was rushing to get to the hospital. EKG showed atrial flutter with ventricular rate 140 bpm. She was sent to the emergency room, 3 hours later was back in normal sinus rhythm. No medications were given to restore normal sinus rhythm.  echocardiogram in May 2006 which was essentially normal with ejection fraction 60%.  Stress test in 06, 03 and 01 had been normal.  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  PSH:         Past Surgical History:  Procedure Laterality Date  . APPENDECTOMY    . CARDIAC CATHETERIZATION    . cataract surgery    . COLONOSCOPY    . COLONOSCOPY WITH PROPOFOL N/A 05/09/2016   Procedure: COLONOSCOPY WITH PROPOFOL;  Surgeon: Robert Bellow, MD;  Location: [facility] ENDOSCOPY;  Service: Endoscopy;  Laterality: N/A;  . DIAGNOSTIC MAMMOGRAM    . EYE SURGERY Bilateral    Cataract Extraction  . JOINT REPLACEMENT  2016  . KNEE ARTHROPLASTY Right 03/30/2015    Procedure: COMPUTER ASSISTED TOTAL KNEE ARTHROPLASTY;  Surgeon: Dereck Leep, MD;  Location: [facility] ORS;  Service: Orthopedics;  Laterality: Right;  . KNEE ARTHROSCOPY Right   . KNEE CLOSED REDUCTION Right 05/23/2015   Procedure: CLOSED MANIPULATION KNEE;  Surgeon: Dereck Leep, MD;  Location: [facility] ORS;  Service: Orthopedics;  Laterality: Right;  . TONSILLECTOMY            Current Outpatient Medications  Medication Sig Dispense Refill  . acetaminophen (TYLENOL) 500 MG tablet Take 500 mg by mouth every 6 (six) hours as needed. Takes 1300mg  at bedtime with Zanaflex    . ALFALFA PO Take 3 tablets by mouth every morning.    Marland Kitchen apixaban (ELIQUIS) 5 MG TABS tablet Take 1 tablet (5 mg total) by mouth 2 (two) times daily. 30 tablet 11  . aspirin 81 MG EC tablet Take 81 mg by mouth at bedtime.    . B Complex-C (B-COMPLEX WITH VITAMIN C) tablet Take 1 tablet by mouth at bedtime.    . BD PEN NEEDLE NANO U/F 32G X 4 MM MISC USE 4 TIMES DAILY (HUMALOG 3 TIMES DAILY AND LANTUS ONCE DAILY) OFFICE NOTIFIED 08/06/17 90 each 1  . BLACK ELDERBERRY,BERRY-FLOWER, PO Take by mouth.    . calcium carbonate (OS-CAL) 600 MG TABS tablet Take 1,000 mg by mouth daily.     . cholecalciferol (VITAMIN D) 1000 UNITS tablet Take 1,000 Units by mouth daily.     . clobetasol ointment (TEMOVATE) 6.14 % APPLY 1 APPLICATION TOPICALLY 2 (TWO) TIMES DAILY AS NEEDED (BUG BITES). 30 g 0  . Coenzyme Q10 (COQ10) 200 MG CAPS Take 1 tablet by mouth daily. 100 mg every am.    . Continuous Blood Gluc Sensor ([place]) MISC by Does not apply route.    Regino Schultze Bandages & Supports (MEDICAL COMPRESSION STOCKINGS) MISC 1 each by Does not apply route daily. 2 each 5  . estradiol (ESTRACE VAGINAL) 0.1 MG/GM vaginal cream 1/2 gm once weekly using applicator, apply blueberry sized amount of cream using tip of finger to urethra twice weekly 30 g 3  . Exenatide ER (BYDUREON BCISE) 2 MG/0.85ML AUIJ Inject  into the skin.    . Eyelid Cleansers (AVENOVA) 0.01 % SOLN See admin instructions.  3  . fish oil-omega-3 fatty acids 1000 MG capsule Take 500 mg by mouth daily. Krill Oil    . fluticasone (FLONASE) 50 MCG/ACT nasal spray PLACE 2 SPRAYS INTO BOTH NOSTRILS DAILY. 8.5 g 2  . GARLIC 4315 PO Take 1 tablet by mouth daily. 1 gram    . Glucosamine 500 MG TABS Take 1 tablet by mouth daily.    Marland Kitchen HUMALOG KWIKPEN 100 UNIT/ML KiwkPen Inject 6 Units into the skin 3 (three) times daily after meals. Pt taking before meals per Dr. Honor Junes, sometimes just twice Maximum 11 units per patient  3  . Insulin Glargine (LANTUS SOLOSTAR) 100 UNIT/ML Solostar Pen Inject 24 Units into the skin every morning. 15 pen 2  . losartan-hydrochlorothiazide (HYZAAR) 100-25 MG tablet Take 1 tablet by mouth in the morning. 90 tablet 1  . Mag Aspart-Potassium Aspart (POTASSIUM & MAGNESIUM ASPARTAT PO) Take 1 tablet by mouth daily.    . metoprolol succinate (TOPROL-XL) 50 MG 24 hr tablet Take 1 tablet (50 mg total)  by mouth daily. Take with or immediately following a meal. 90 tablet 3  . Multiple Vitamins-Minerals (PRESERVISION AREDS 2) CAPS Take 1 capsule by mouth 2 (two) times daily.    Marland Kitchen nystatin cream (MYCOSTATIN) Apply 1 application topically 2 (two) times daily. 60 g 2  . Probiotic Product (PROBIOTIC PO) Take 1 Dose by mouth at bedtime.    Marland Kitchen PROLIA 60 MG/ML SOSY injection     . Propylene Glycol (SYSTANE BALANCE OP) Apply 1 drop to eye as needed.    . rosuvastatin (CRESTOR) 10 MG tablet TAKE 1 TABLET (10 MG TOTAL) BY MOUTH 4 (FOUR) TIMES A WEEK. 48 tablet 1  . sodium fluoride (FLUORISHIELD) 1.1 % GEL dental gel     . sulfacetamide (BLEPH-10) 10 % ophthalmic solution Place 1 drop into both eyes 3 (three) times daily as needed.    Marland Kitchen tiZANidine (ZANAFLEX) 2 MG tablet TAKE 1 TABLET BY MOUTH EVERY 6 HOURS AS NEEDED FOR MUSCLE SPASMS. 120 tablet 1  . Vitamin D, Ergocalciferol, (DRISDOL) 1.25 MG (50000 UT)  CAPS capsule Take 1 capsule by mouth once a week.    . vitamin E 180 MG (400 UNITS) capsule Take 400 Units by mouth daily as needed. Every am     No current facility-administered medications for this visit.     Allergies:   Cyclobenzaprine, Cheese, Ciprofloxacin hcl, Coconut oil, and Keflex [cephalexin]   Social History:  The patient  reports that she has never smoked. She has never used smokeless tobacco. She reports current alcohol use of about 1.0 standard drink of alcohol per week. She reports that she does not use drugs.   Family History:   family history includes Breast cancer (age of onset: 25) in her maternal aunt; Cancer in her father and mother; Colon cancer in her son; Diabetes in her mother; Hypertension in her father and mother; Kidney disease in her mother.    Review of Systems: Review of Systems  Constitutional: Negative.   Eyes: Negative.   Respiratory: Positive for shortness of breath.   Cardiovascular: Negative.   Gastrointestinal: Negative.   Genitourinary: Negative.   Musculoskeletal: Positive for joint pain.  Neurological: Negative.   Psychiatric/Behavioral: Negative.   All other systems reviewed and are negative.   PHYSICAL EXAM: VS:  BP (!) 150/80 (BP Location: Left Arm, Patient Position: Sitting, Cuff Size: Large)   Pulse 87   Ht 5\' 3"  (1.6 m)   Wt 196 lb 6 oz (89.1 kg)   SpO2 98%   BMI 34.79 kg/m  , BMI Body mass index is 34.79 kg/m. Constitutional:  oriented to person, place, and time. No distress.  HENT:  Head: Grossly normal Eyes:  no discharge. No scleral icterus.  Neck: No JVD, no carotid bruits  Cardiovascular: Irregularly irregular,  no murmurs appreciated Pulmonary/Chest: Clear to auscultation bilaterally, no wheezes or rails Abdominal: Soft.  no distension.  no tenderness.  Musculoskeletal: Normal range of motion Neurological:  normal muscle tone. Coordination normal. No atrophy Skin: Skin warm and dry Psychiatric: normal  affect, pleasant  Recent Labs: 05/13/2020: ALT 22; BUN 15; Creatinine, Ser 0.58; Hemoglobin 12.5; Platelets 202; Potassium 3.5; Sodium 137    Lipid Panel Recent Labs       Lab Results  Component Value Date   CHOL 130 07/12/2020   HDL 75 07/12/2020   LDLCALC 38 07/12/2020   TRIG 86 07/12/2020           Wt Readings from Last 3 Encounters:  11/04/20 196 lb  6 oz (89.1 kg)  09/26/20 198 lb (89.8 kg)  07/12/20 203 lb 9.6 oz (92.4 kg)     ASSESSMENT AND PLAN:  New onset atrial fibrillation Long discussion with patient and her husband concerning various treatment options for her atrial fibrillation -Discussed rate control versus rhythm control Discussed cardioversion, risk and benefit -For now recommend that she continue Eliquis 5 twice daily Metoprolol succinate up to 75 daily -Lasix 20 mg as needed for any leg swelling abdominal bloating shortness of breath -She will think about cardioversion with her husband and let our office know if she would like to proceed -Potentially would do well with rate control only given her sedentary lifestyle In general has been asymptomatic  Hyperlipidemia, unspecified hyperlipidemia type  Cholesterol is at goal on the current lipid regimen. No changes to the medications were made. Lifestyle modification recommended  Hypertension, benign  Increase metoprolol succinate up to 75 mg daily for blood pressure and rate control  Atrial flutter, unspecified type ([place])   following surgery on her knee Previously treated with flecainide, Now with new A. 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 11/04/2020  [facility] [place], [place] [phone]

## 2020-12-06 ENCOUNTER — Other Ambulatory Visit: Payer: Self-pay | Admitting: Family Medicine

## 2020-12-06 DIAGNOSIS — W57XXXD Bitten or stung by nonvenomous insect and other nonvenomous arthropods, subsequent encounter: Secondary | ICD-10-CM

## 2020-12-06 NOTE — Telephone Encounter (Signed)
Requested Prescriptions  Pending Prescriptions Disp Refills  . clobetasol ointment (TEMOVATE) 0.05 % [Pharmacy Med Name: CLOBETASOL 0.05% OINTMENT] 30 g 0    Sig: APPLY 1 APPLICATION TOPICALLY 2 TIMES DAILY AS NEEDED (BUG BITES).     Dermatology:  Corticosteroids Passed - 12/06/2020 11:23 AM      Passed - Valid encounter within last 12 months    Recent Outpatient Visits          4 months ago Diabetes mellitus type 2 in obese [facility])   [facility] Steele Sizer, MD   9 months ago [facility] Steele Sizer, MD   11 months ago Atherosclerosis of renal artery [facility])   [facility] Steele Sizer, MD   1 year ago Acute left-sided low back pain without sciatica   [place], [place]   1 year ago 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[facility])   [facility] Steele Sizer, MD      Future Appointments            In 1 month Ancil Boozer, Drue Stager, MD [facility], [place]   In 2 months Mickle Plumb, Jacquelyn D, [place], [place]   In 4 months  [facility], [place]   In 6 months [place], Gordan [facility]

## 2020-12-08 DIAGNOSIS — M9901 Segmental and somatic dysfunction of cervical region: Secondary | ICD-10-CM | POA: Diagnosis not present

## 2020-12-08 DIAGNOSIS — M5416 Radiculopathy, lumbar region: Secondary | ICD-10-CM | POA: Diagnosis not present

## 2020-12-08 DIAGNOSIS — M5033 Other cervical disc degeneration, cervicothoracic region: Secondary | ICD-10-CM | POA: Diagnosis not present

## 2020-12-08 DIAGNOSIS — M9903 Segmental and somatic dysfunction of lumbar region: Secondary | ICD-10-CM | POA: Diagnosis not present

## 2020-12-15 ENCOUNTER — Telehealth: Payer: Self-pay | Admitting: Cardiovascular Disease

## 2020-12-15 NOTE — Telephone Encounter (Signed)
Patient c/o Palpitations:  High priority if patient c/o lightheadedness, shortness of breath, or chest pain  How long have you had palpitations/irregular HR/ Afib? Are you having the symptoms now? Last night 6/16, nothing now  Are you currently experiencing lightheadedness, SOB or CP? Very little   Do you have a history of afib (atrial fibrillation) or irregular heart rhythm? Yes   Have you checked your BP or HR? (document readings if available): not checked  Are you experiencing any other symptoms? none

## 2020-12-15 NOTE — Telephone Encounter (Signed)
HR 06/15 158 and 148

## 2020-12-15 NOTE — Telephone Encounter (Signed)
Was able to reach back out by phone to Mrs. Sunnyslope regarding her elevated HR last pm. She reports not sure if she is back in Afib or her HR ws just elevated, stated she was sitting on the couch and felt like her heart was racing, checked her pulse with the oximeter and it read 148 and 158. Stated it lasted about 1 min then her HR was WNL. Denies CP or SOB.   Pt reports this am her HR has been at her baseline, walked this am and has been doing chores/errands and HR did not get elevated nor jumped into A-fib. Has felt fine all day, no cardiac symptoms.  Had recent cardioversion on 11/30/2020 with Dr. Rockey Situ, stated she unsure if she needs another. Recent medication change after her cardioversion per Dr. Rockey Situ advice to her. Pt was on metoprolol 50 mg in the am and 25 mg in the pm for total 75 mg daily of metoprolol. Mrs. Venezia reports Dr. Rockey Situ advised her to take metoprolol 50 mg in the am and 50 mg in the pm in additional to her Hyzarr 100-25 mg daily. (Will update pt's medication list as there are two orders for metoprolol).  Pt went ahead and schedule herself with [name] PA-CC on 6/23 at 11 am to be evaluated. Advised in the meantime to keep a log of her BP and HR, to jot down the numbers, date, and time for review. Continue medications as prescribe, advised if she develops CP or SOB and cannot get out of A-fib then seek medical attention via EMS or ED. Mrs. Beane is very thankful for the return call, agreeable to plan and will call back with anything further.

## 2020-12-21 NOTE — Progress Notes (Signed)
Cardiology Office Note    Date:  12/22/2020   ID:  Bonnie, Rowland [birth year], MRN [id number]  PCP:  Steele Sizer, MD  Cardiologist:  Ida Rogue, MD  Electrophysiologist:  None   Chief Complaint: Follow-up DCCV  History of Present Illness:   Bonnie Rowland is a 80 y.o. female with history of normal coronary arteries by LHC in 09/2005, persistent A. Fib status post DCCV on 11/30/2020 on Eliquis, postoperative atrial flutter in 01/2015, less than 50% left renal artery stenosis by imaging in 2021, 1 to 39% bilateral ICA carotid stenosis by imaging in 2017, DM2, HTN, HLD, and obesity who presents for follow-up after recent DCCV.  Prior documentation indicates she was diagnosed with atrial flutter with RVR in 01/2015 after presenting for a total right knee replacement with spontaneous conversion to sinus rhythm.  She had been maintained on flecainide briefly.  More recently, she was seen in 08/2020 for routine 59-month follow-up and noted to be in asymptomatic A. fib with titration of metoprolol and addition of Eliquis at that time.  Echo in 09/2020 demonstrated an EF of 60 to 65%, no regional wall motion abnormalities, indeterminate LV diastolic function parameters, normal RV systolic function and ventricular cavity size, normal PASP, mildly dilated left atrium, and mild mitral regurgitation.  She subsequently underwent successful DCCV at 150 J and 200 J on 11/30/2020 with conversion to sinus rhythm.  She comes in doing reasonably well from a cardiac perspective.  Since undergoing cardioversion she did have an episode where she checked her heart rates with her pulse ox with readings in the 150s bpm which subsequently improved without intervention with the majority of her readings there after being in the 70s to 90s bpm.  She does not feel any tachypalpitations or increased shortness of breath.  No chest pain.  Prior to undergoing DCCV she indicates she felt like she was "walking through  mud.".  Since undergoing cardioversion she has not had that sensation though she does feel a fullness in her head.  No falls, hematochezia, or melena.  She has not missed any doses of apixaban.  She is tolerating higher dose metoprolol.  She is noted to be back in A. fib with controlled ventricular response in the office today.   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; left shoulder blade   Cancer ([place]) 05/2014   basal cell removed left temple   Cataract 2003   Cellulitis and abscess of leg, except foot    Conjunctivitis unspecified    Dermatophytosis of nail    Diabetes mellitus    type II   Esophageal reflux    Hyperlipidemia    Hypertension    Osteoporosis 2016   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)    Renal artery stenosis (HCC)    Sprain of lumbar region    Thoracic or lumbosacral neuritis or radiculitis, unspecified    Urinary tract infection, site not specified     Past Surgical History:  Procedure Laterality Date   APPENDECTOMY  CARDIAC CATHETERIZATION     CARDIOVERSION N/A 11/30/2020   Procedure: CARDIOVERSION;  Surgeon: Minna Merritts, MD;  Location: [facility] ORS;  Service: Cardiovascular;  Laterality: N/A;   cataract surgery     COLONOSCOPY     COLONOSCOPY WITH PROPOFOL N/A 05/09/2016   Procedure: COLONOSCOPY WITH PROPOFOL;  Surgeon: Robert Bellow, MD;  Location: [facility] END[facility]COPY;  Service: Endoscopy;  Laterality: N/A;   DIAGNOSTIC MAMMOGRAM     EYE SURGERY Bilateral    Cataract Extraction   JOINT REPLACEMENT  2016   KNEE ARTHROPLASTY Right 03/30/2015    Procedure: COMPUTER ASSISTED TOTAL KNEE ARTHROPLASTY;  Surgeon: Dereck Leep, MD;  Location: [facility] ORS;  Service: Orthopedics;  Laterality: Right;   KNEE ARTHROSCOPY Right    KNEE CLOSED REDUCTION Right 05/23/2015   Procedure: CLOSED MANIPULATION KNEE;  Surgeon: Dereck Leep, MD;  Location: [facility] ORS;  Service: Orthopedics;  Laterality: Right;   TONSILLECTOMY      Current Medications: Current Meds  Medication Sig   acetaminophen (TYLENOL) 650 MG CR tablet Take 1,300 mg by mouth at bedtime.   ALFALFA PO Take 1 tablet by mouth daily.   amiodarone (PACERONE) 200 MG tablet Take 2 tablets (400 mg total) by mouth 2 (two) times daily for 7 days, THEN 1 tablet (200 mg total) 2 (two) times daily for 7 days, THEN 1 tablet (200 mg total) daily for 16 days.   [START ON 01/21/2021] amiodarone (PACERONE) 200 MG tablet Take 1 tablet (200 mg total) by mouth daily. Startin on 01/21/21 after previous prescription has been completed.   apixaban (ELIQUIS) 5 MG TABS tablet Take 1 tablet (5 mg total) by mouth 2 (two) times daily.   ascorbic Acid (VITAMIN C) 500 MG CPCR Take 500 mg by mouth daily.   B Complex-C (B-COMPLEX WITH VITAMIN C) tablet Take 1 tablet by mouth 2 (two) times daily.   BD PEN NEEDLE NANO U/F 32G X 4 MM MISC USE 4 TIMES DAILY (HUMALOG 3 TIMES DAILY AND LANTUS ONCE DAILY) OFFICE NOTIFIED 08/06/17   BLACK ELDERBERRY,BERRY-FLOWER, PO Take 1 Dose by mouth daily. Liquid   Calcium Carbonate (CALCIUM 600 PO) Take 600 mg by mouth daily.   CALCIUM PO Take 1 tablet by mouth daily.   cholecalciferol (VITAMIN D) 1000 UNITS tablet Take 1,000 Units by mouth daily.    clobetasol ointment (TEMOVATE) 9.83 % APPLY 1 APPLICATION TOPICALLY 2 TIMES DAILY AS NEEDED (BUG BITES).   Coenzyme Q10 (COQ10) 200 MG CAPS Take 200 mg by mouth daily.   Continuous Blood Gluc Sensor ([place]) MISC by Does not apply route.   Elastic Bandages & Supports (MEDICAL COMPRESSION STOCKINGS) MISC 1 each by Does not  apply route daily.   estradiol (ESTRACE VAGINAL) 0.1 MG/GM vaginal cream 1/2 gm once weekly using applicator, apply blueberry sized amount of cream using tip of finger to urethra twice weekly   Exenatide ER (BYDUREON BCISE) 2 MG/0.85ML AUIJ Inject 2 mg into the skin every Sunday.   fluconazole (DIFLUCAN) 100 MG tablet Take 100 mg by mouth as needed.   fluticasone (FLONASE) 50 MCG/ACT nasal spray PLACE 2 SPRAYS INTO BOTH NOSTRILS DAILY.   furosemide (LASIX) 20 MG tablet Take 1 tablet (20 mg total) by mouth daily as needed.   GARLIC 3825 PO Take 0,539 mg by mouth daily.   Glucosamine 500 MG TABS Take 500 mg by mouth daily.   HUMALOG KWIKPEN 100 UNIT/ML KiwkPen Inject 4-12 Units into the skin 3 (three) times daily  as needed (high blood sugar).   insulin glargine (LANTUS) 100 unit/mL SOPN Inject 22 Units into the skin daily.   Krill Oil 1000 MG CAPS Take 1,000 mg by mouth daily.   losartan-hydrochlorothiazide (HYZAAR) 100-25 MG tablet Take 1 tablet by mouth in the morning.   Mag Aspart-Potassium Aspart (POTASSIUM & MAGNESIUM ASPARTAT PO) Take 1 tablet by mouth daily.   metoprolol succinate (TOPROL-XL) 50 MG 24 hr tablet Take 1 tablet (50 mg total) by mouth 2 (two) times daily. Take with or immediately following a meal.   Multiple Vitamins-Minerals (PRESERVISION AREDS 2) CAPS Take 1 capsule by mouth 2 (two) times daily.   nystatin cream (MYCOSTATIN) Apply 1 application topically as needed for dry skin.   OVER THE COUNTER MEDICATION Place 1 application into both eyes See admin instructions. Blephadex eyelid foam, apply to eyelids once daily   OVER THE COUNTER MEDICATION Take 1 tablet by mouth daily. Vita-Lea Gold otc supplement   OVER THE COUNTER MEDICATION Take 1 tablet by mouth See admin instructions. Nutriferon otc supplement take Take 1 tablet on Sun, Tues, Thurs, Sat. Take 1 tablet twice daily on Mon, Wed, and Fri   Probiotic Product (PROBIOTIC PO) Take 1 capsule by mouth at bedtime.   PROLIA 60  MG/ML SOSY injection Inject 60 mg into the skin every 6 (six) months.   Propylene Glycol (SYSTANE BALANCE OP) Place 1 drop into both eyes daily as needed (dry eyes).   rosuvastatin (CRESTOR) 10 MG tablet Take 10 mg by mouth daily.   Sodium Chloride-Sodium Bicarb (NETI POT SINUS WASH NA) Place 1 Dose into the nose at bedtime.   sodium fluoride (FLUORISHIELD) 1.1 % GEL dental gel Place 1 application onto teeth at bedtime.   tiZANidine (ZANAFLEX) 2 MG tablet TAKE 1 TABLET BY MOUTH EVERY 6 HOURS AS NEEDED FOR MUSCLE SPASMS.   Vitamin D, Ergocalciferol, (DRISDOL) 1.25 MG (50000 UT) CAPS capsule Take 50,000 Units by mouth every Saturday.    Allergies:   Cyclobenzaprine, Cheese, Ciprofloxacin hcl, Coconut oil, and Keflex [cephalexin]   Social History   Socioeconomic History   Marital status: Married    Spouse name: Emergency planning/management officer   Number of children: 1   Years of education: Not on file   Highest education level: Master's degree (e.g., MA, MS, MEng, MEd, MSW, MBA)  Occupational History   Occupation: Retired  Tobacco Use   Smoking status: Never   Smokeless tobacco: Never  Vaping Use   Vaping Use: Never used  Substance and Sexual Activity   Alcohol use: Yes    Alcohol/week: 1.0 standard drink    Types: 1 Glasses of wine per week   Drug use: No   Sexual activity: Yes    Partners: Male    Birth control/protection: Post-menopausal  Other Topics Concern   Not on file  Social History Narrative   She is married , only has one son and he was diagnosed with colon cancer at age 85.    Social Determinants of Health   Financial Resource Strain: Low Risk    Difficulty of Paying Living Expenses: Not very hard  Food Insecurity: No Food Insecurity   Worried About Charity fundraiser in the Last Year: Never true   Ran Out of Food in the Last Year: Never true  Transportation Needs: No Transportation Needs   Lack of Transportation (Medical): No   Lack of Transportation (Non-Medical): No  Physical  Activity: Sufficiently Active   Days of Exercise per Week: 7 days  Minutes of Exercise per Session: 30 min  Stress: No Stress Concern Present   Feeling of Stress : Not at all  Social Connections: Socially Integrated   Frequency of Communication with Friends and Family: More than three times a week   Frequency of Social Gatherings with Friends and Family: More than three times a week   Attends Religious Services: More than 4 times per year   Active Member of [employer] or Organizations: Yes   Attends Music therapist: More than 4 times per year   Marital Status: Married     Family History:  The patient's family history includes Breast cancer (age of onset: 35) in her maternal aunt; Cancer in her father and mother; Colon cancer in her son; Diabetes in her mother; Hypertension in her father and mother; Kidney disease in her mother. There is no history of Kidney cancer or Bladder Cancer.  ROS:   Review of Systems  Constitutional:  Positive for malaise/fatigue. Negative for chills, diaphoresis, fever and weight loss.  HENT:  Negative for congestion.   Eyes:  Negative for discharge and redness.  Respiratory:  Negative for cough, sputum production, shortness of breath and wheezing.   Cardiovascular:  Positive for leg swelling. Negative for chest pain, palpitations, orthopnea, claudication and PND.  Gastrointestinal:  Negative for abdominal pain, blood in stool, heartburn, melena, nausea and vomiting.  Musculoskeletal:  Negative for falls and myalgias.  Skin:  Negative for rash.  Neurological:  Positive for headaches. Negative for dizziness, tingling, tremors, sensory change, speech change, focal weakness, loss of consciousness and weakness.  Endo/Heme/Allergies:  Does not bruise/bleed easily.  Psychiatric/Behavioral:  Negative for substance abuse. The patient is not nervous/anxious.   All other systems reviewed and are negative.   EKGs/Labs/Other Studies Reviewed:    Studies  reviewed were summarized above. The additional studies were reviewed today:  2D echo 09/2020: 1. Left ventricular ejection fraction, by estimation, is 60 to 65%. The  left ventricle has normal function. The left ventricle has no regional  wall motion abnormalities. Left ventricular diastolic parameters are  indeterminate.   2. Right ventricular systolic function is normal. The right ventricular  size is normal. There is normal pulmonary artery systolic pressure.   3. Left atrial size was mildly dilated.   4. The mitral valve is normal in structure. Mild mitral valve  regurgitation.  __________  Renal artery ultrasound 08/2019: Summary:  Largest Aortic Diameter: 1.6 cm     Renal:     Right: Normal size right kidney. Normal right Resisitive Index.         Normal cortical thickness of right kidney. No evidence of         right renal artery stenosis. RRV flow present.  Left:  Cyst(s) noted. 1-59% stenosis of the left renal artery.         Normal size of left kidney. Normal left Resistive Index.         Normal cortical thickness of the left kidney. Cyst 1.62 cm x         1.71 cm.  Mesenteric:  Normal Celiac artery and Superior Mesenteric artery findings.  Patent IVC.  __________  Carotid artery ultrasound 08/2015: 1 to 39% bilateral ICA stenosis __________  2D echo 01/2015: - Left ventricle: The cavity size was normal. Systolic function was    normal. The estimated ejection fraction was in the range of 60%    to 65%. Wall motion was normal; there were no regional wall  motion abnormalities. Left ventricular diastolic function    parameters were normal.  - Mitral valve: There was mild regurgitation.  - Left atrium: The atrium was mildly dilated.  - Right ventricle: Systolic function was normal.  - Pulmonary arteries: Systolic pressure was borderline elevated. PA    peak pressure: 37 mm Hg (S).   Impressions:   - Normal study. Rhythm is normal sinus.  EKG:  EKG is ordered  today.  The EKG ordered today demonstrates A. fib, 94 bpm, occasional PVCs, low voltage QRS, nonspecific ST-T changes  Recent Labs: 05/13/2020: ALT 22; BUN 15; Creatinine, Ser 0.58; Hemoglobin 12.5; Platelets 202; Potassium 3.5; Sodium 137  Recent Lipid Panel    Component Value Date/Time   CHOL 130 07/12/2020 0952   CHOL 110 01/12/2016 0817   TRIG 86 07/12/2020 0952   HDL 75 07/12/2020 0952   HDL 59 01/12/2016 0817   CHOLHDL 1.7 07/12/2020 0952   LDLCALC 38 07/12/2020 0952    PHYSICAL EXAM:    VS:  BP 120/80 (BP Location: Left Arm, Patient Position: Sitting, Cuff Size: Normal)   Pulse 94   Ht 5\' 3"  (1.6 m)   Wt 197 lb 4 oz (89.5 kg)   SpO2 97%   BMI 34.94 kg/m   BMI: Body mass index is 34.94 kg/m.  Physical Exam Vitals reviewed.  Constitutional:      Appearance: She is well-developed.  HENT:     Head: Normocephalic and atraumatic.  Eyes:     General:        Right eye: No discharge.        Left eye: No discharge.  Neck:     Vascular: No JVD.  Cardiovascular:     Rate and Rhythm: Normal rate. Rhythm irregularly irregular.     Pulses:          Posterior tibial pulses are 2+ on the right side and 2+ on the left side.     Heart sounds: Normal heart sounds, S1 normal and S2 normal. Heart sounds not distant. No midsystolic click and no opening snap. No murmur heard.   No friction rub.  Pulmonary:     Effort: Pulmonary effort is normal. No respiratory distress.     Breath sounds: Normal breath sounds. No decreased breath sounds, wheezing or rales.  Chest:     Chest wall: No tenderness.  Abdominal:     General: There is no distension.     Palpations: Abdomen is soft.     Tenderness: There is no abdominal tenderness.  Musculoskeletal:     Cervical back: Normal range of motion.     Right lower leg: Edema present.     Left lower leg: Edema present.     Comments: Trace bilateral pretibial edema with the right lower extremity being greater than the left lower extremity   Skin:    General: Skin is warm and dry.     Nails: There is no clubbing.  Neurological:     Mental Status: She is alert and oriented to person, place, and time.  Psychiatric:        Speech: Speech normal.        Behavior: Behavior normal.        Thought Content: Thought content normal.        Judgment: Judgment normal.    Wt Readings from Last 3 Encounters:  12/22/20 197 lb 4 oz (89.5 kg)  11/30/20 198 lb (89.8 kg)  11/04/20 196 lb 6 oz (89.1 kg)  ASSESSMENT & PLAN:   Persistent A. fib: She is back in A. fib with controlled ventricular response.  She would like to move forward with continued rhythm control strategy.  With this, we had a discussion of rhythm control therapy and have agreed to initiate amiodarone 400 mg twice daily for 1 week followed by 200 mg twice daily for 1 week followed by 20 mg daily thereafter.  She will otherwise continue Toprol-XL 50 mg twice daily along with apixaban 5 mg twice daily.  The importance of not missing any doses of [place] was discussed with her.  We will see her back in approximately 3 to 4 weeks, and if she remains in A. fib at that time we will pursue repeat DCCV on AAD.  No symptoms concerning for bleeding with recent CBC showing stable hemoglobin as outlined above.  Atrial flutter: Quiescent and likely in the setting of surgery in 2016.  Previously managed with flecainide though this was discontinued several years ago.  PVCs: Appears to be largely asymptomatic.  Continue metoprolol with the addition of amiodarone given persistent A. fib as outlined above.  Recent potassium at goal with normal thyroid function.  In follow-up consider checking magnesium.  Recent echo demonstrating preserved LVSF.  Lower extremity swelling: She has a long history of lower extremity swelling with the right typically being worse than the left following her prior right TKA with swelling likely being largely dependent in etiology.  Cannot exclude some degree of  diastolic dysfunction contributing at this point given persistent A. fib.  She has taken Lasix as needed intermittently with some improvement in swelling.  Continue to recommend compression hose and leg elevation.  Would look to avoid calcium channel blockers as vasodilation would likely exacerbate her lower extremity swelling.  HTN: Blood pressure is well controlled in the office today.  Continue current medications including Hyzaar and Toprol-XL.  RAS: Stable less than 50% left renal artery stenosis on last imaging.  Continue medical management.  Carotid artery disease: 1 to 39% bilateral ICA stenosis on last ultrasound.  Consider updating in follow-up.  HLD: LDL 38.  She remains on rosuvastatin.  Disposition: F/u with Dr. Rockey Situ or an APP in 3 to 4-weeks.   Medication Adjustments/Labs and Tests Ordered: Current medicines are reviewed at length with the patient today.  Concerns regarding medicines are outlined above. Medication changes, Labs and Tests ordered today are summarized above and listed in the Patient Instructions accessible in Encounters.   Signed, [name], [name] 12/22/2020 12:08 PM     [facility] [place], [place] [id number] [phone]

## 2020-12-22 ENCOUNTER — Other Ambulatory Visit: Payer: Self-pay

## 2020-12-22 ENCOUNTER — Ambulatory Visit (INDEPENDENT_AMBULATORY_CARE_PROVIDER_SITE_OTHER): Payer: Medicare Other | Admitting: Physician Assistant

## 2020-12-22 ENCOUNTER — Encounter: Payer: Self-pay | Admitting: Physician Assistant

## 2020-12-22 VITALS — BP 120/80 | HR 94 | Ht 63.0 in | Wt 197.2 lb

## 2020-12-22 DIAGNOSIS — I4892 Unspecified atrial flutter: Secondary | ICD-10-CM | POA: Diagnosis not present

## 2020-12-22 DIAGNOSIS — M5033 Other cervical disc degeneration, cervicothoracic region: Secondary | ICD-10-CM | POA: Diagnosis not present

## 2020-12-22 DIAGNOSIS — I701 Atherosclerosis of renal artery: Secondary | ICD-10-CM | POA: Diagnosis not present

## 2020-12-22 DIAGNOSIS — I493 Ventricular premature depolarization: Secondary | ICD-10-CM

## 2020-12-22 DIAGNOSIS — I4819 Other persistent atrial fibrillation: Secondary | ICD-10-CM

## 2020-12-22 DIAGNOSIS — M9903 Segmental and somatic dysfunction of lumbar region: Secondary | ICD-10-CM | POA: Diagnosis not present

## 2020-12-22 DIAGNOSIS — E785 Hyperlipidemia, unspecified: Secondary | ICD-10-CM

## 2020-12-22 DIAGNOSIS — M7989 Other specified soft tissue disorders: Secondary | ICD-10-CM

## 2020-12-22 DIAGNOSIS — M9901 Segmental and somatic dysfunction of cervical region: Secondary | ICD-10-CM | POA: Diagnosis not present

## 2020-12-22 DIAGNOSIS — I10 Essential (primary) hypertension: Secondary | ICD-10-CM | POA: Diagnosis not present

## 2020-12-22 DIAGNOSIS — M5416 Radiculopathy, lumbar region: Secondary | ICD-10-CM | POA: Diagnosis not present

## 2020-12-22 DIAGNOSIS — I6523 Occlusion and stenosis of bilateral carotid arteries: Secondary | ICD-10-CM

## 2020-12-22 MED ORDER — AMIODARONE HCL 200 MG PO TABS: ORAL_TABLET | ORAL | 0 refills | Status: DC

## 2020-12-22 MED ORDER — AMIODARONE HCL 200 MG PO TABS: 200.0000 mg | ORAL_TABLET | Freq: Every day | ORAL | 3 refills | Status: DC

## 2020-12-22 NOTE — Patient Instructions (Signed)
Medication Instructions:  Your physician has recommended you make the following change in your medication:   START Amiodarone 200 mg and take 2 tablets (400 mg) twice a day for one week.  THEN take 1 tablet (200 mg) twice a day for one week.  THEN take 1 tablet (200 mg) once a day.   *If you need a refill on your cardiac medications before your next appointment, please call your pharmacy*   Lab Work: None  If you have labs (blood work) drawn today and your tests are completely normal, you will receive your results only by: [place] (if you have MyChart) OR A paper copy in the mail If you have any lab test that is abnormal or we need to change your treatment, we will call you to review the results.   Testing/Procedures: None    Follow-Up: At [facility], you and your health needs are our priority.  As part of our continuing mission to provide you with exceptional heart care, we have created designated Provider Care Teams.  These Care Teams include your primary Cardiologist (physician) and Advanced Practice Providers (APPs -  Physician Assistants and Nurse Practitioners) who all work together to provide you with the care you need, when you need it.   Your next appointment:   3-4  week(s)  The format for your next appointment:   In Person  Provider:   Ida Rogue, MD or [name], [name]

## 2020-12-28 ENCOUNTER — Other Ambulatory Visit: Payer: Self-pay | Admitting: Family Medicine

## 2020-12-28 DIAGNOSIS — M62830 Muscle spasm of back: Secondary | ICD-10-CM

## 2020-12-28 NOTE — Telephone Encounter (Signed)
Requested medication (s) are due for refill today - unsure  Requested medication (s) are on the active medication list -yes  Future visit scheduled -yes  Last refill: 12/02/20 #120 1 RF  Notes to clinic: Request Rx- non delegated Rx  Requested Prescriptions  Pending Prescriptions Disp Refills   tiZANidine (ZANAFLEX) 2 MG tablet [Pharmacy Med Name: TIZANIDINE HCL 2 MG TABLET] 120 tablet 1    Sig: TAKE 1 TABLET BY MOUTH EVERY 6 HOURS AS NEEDED FOR MUSCLE SPASMS.      Not Delegated - Cardiovascular:  Alpha-2 Agonists - tizanidine Failed - 12/28/2020  1:31 PM      Failed - This refill cannot be delegated      Passed - Valid encounter within last 6 months    Recent Outpatient Visits           5 months ago Diabetes mellitus type 2 in obese [facility])   [facility] Steele Sizer, MD   10 months ago [facility] Steele Sizer, MD   11 months ago Atherosclerosis of renal artery [facility])   [facility] Steele Sizer, MD   1 year ago Acute left-sided low back pain without sciatica   [place], MD   1 year ago 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[facility])   [facility] Steele Sizer, MD       Future Appointments             In 1 week Steele Sizer, MD [facility], [place]   In 2 weeks Gollan, Kathlene November, MD [facility]   In 3 months  [place]   In 5 months McGowan, Gordan Payment [facility]                 Requested Prescriptions  Pending Prescriptions Disp Refills   tiZANidine (ZANAFLEX) 2 MG tablet [Pharmacy Med Name: TIZANIDINE HCL 2 MG TABLET] 120 tablet 1    Sig: TAKE 1 TABLET BY MOUTH EVERY 6 HOURS AS NEEDED FOR MUSCLE SPASMS.      Not Delegated - Cardiovascular:  Alpha-2 Agonists - tizanidine Failed - 12/28/2020  1:31 PM      Failed - This refill  cannot be delegated      Passed - Valid encounter within last 6 months    Recent Outpatient Visits           5 months ago Diabetes mellitus type 2 in obese [facility])   [facility] Steele Sizer, MD   10 months ago [facility] Steele Sizer, MD   11 months ago Atherosclerosis of renal artery [facility])   [facility] Steele Sizer, MD   1 year ago Acute left-sided low back pain without sciatica   [place], MD   1 year ago 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

## 2020-12-28 NOTE — Telephone Encounter (Signed)
Next appt is January 09, 2021

## 2021-01-05 NOTE — Progress Notes (Signed)
Name: Bonnie Rowland   MRN: [id number]    DOB: 1941/05/26   Date:01/09/2021       Progress Note  Subjective  Chief Complaint  Follow Up  HPI  Osteoporosis: she was on Evista for years but bone density got worse she was on Reclast but bone density did not improve, and is on Prolia given by Dr. Honor Junes   DM: seeing Endocrinologist - Dr. Manfred Shirts  up to date with eye exam, last one was March 2022 at 6.7 %, she would like to have it done in our office today.   She is on Bydureon, pre-meal insulin and basal insulin. She has hypoglycemic episodes about once a week but not comfirming with fsbs, only using [employer].  Denies polyphagia, polydipsia or polyuria Urine micro is up to date.    HTN: BP at home is usually is well control She denies chest pain, palpitation or SOB She states usually 130/80, but went up to 160/90 once since last time she saw me    Atherosclerosis of aorta: on statin therapy Last LDL was 38, continue Crestor, she is now taking it daily.    Senile purpura: both arms and hands. Stable , she is on Eliquis    Morbid Obesity: BMI above 35 with co-morbidities. She has DM, HTN, dyslipidemia, atherosclerosis of aorta. She is more active since she moved to [place], going to [employer], she is still walking on a regular basis also   Atrial Flibrilation:  she is under the care of Dr. Rockey Situ, tried cardioversion and did not work, now on metoprolol, amiodarone and states episodes of sob and palpitation have improved. She denies chest pain, she has a follow up with Dr. Rockey Situ soon   Back pain: she has intermittent lower back spasms but no recent episodes of radiculitis. She is taking Tizanidine 2 mg qhs at night with Tylenol. She is aware of risk of falls. No associated dizziness with medication.   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   Atrial flutter ([place]) 02/22/2015   Chronic pain 01/10/2015   Type 2 diabetes, uncontrolled, 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 ([place]) 01/10/2015   Fatigue 01/10/2015   History of melanoma excision 01/10/2015   Morbid obesity ([place]) 01/10/2015   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   Spinal stenosis, lumbar region, 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   DDD (degenerative disc disease), lumbar 11/14/2014   Greater trochanteric bursitis of both hips 11/14/2014   Piriformis syndrome 11/14/2014   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   Hyperlipemia 11/07/2009   Hypertension, benign 11/07/2009   Atherosclerosis of renal artery ([place]) 11/07/2009   Hyperlipidemia due to type 2 diabetes mellitus ([place]) 11/07/2009   Perennial allergic rhinitis 11/27/2007   Lumbosacral neuritis 01/17/2007    Past Surgical History:  Procedure Laterality Date   APPENDECTOMY     CARDIAC CATHETERIZATION     CARDIOVERSION N/A 11/30/2020   Procedure: CARDIOVERSION;  Surgeon: Minna Merritts, MD;  Location: [facility] ORS;  Service: Cardiovascular;  Laterality: N/A;   cataract surgery     COLONOSCOPY  COLONOSCOPY WITH PROPOFOL N/A 05/09/2016   Procedure: COLONOSCOPY WITH PROPOFOL;  Surgeon: Robert Bellow, MD;  Location: [facility] ENDOSCOPY;  Service: Endoscopy;  Laterality: N/A;   DIAGNOSTIC MAMMOGRAM     EYE SURGERY Bilateral    Cataract Extraction   JOINT REPLACEMENT  2016   KNEE ARTHROPLASTY Right 03/30/2015   Procedure: COMPUTER ASSISTED TOTAL KNEE ARTHROPLASTY;  Surgeon: Dereck Leep, MD;  Location: [facility] ORS;  Service: Orthopedics;  Laterality: Right;   KNEE ARTHROSCOPY  Right    KNEE CLOSED REDUCTION Right 05/23/2015   Procedure: CLOSED MANIPULATION KNEE;  Surgeon: Dereck Leep, MD;  Location: [facility] ORS;  Service: Orthopedics;  Laterality: Right;   TONSILLECTOMY      Family History  Problem Relation Age of Onset   Diabetes Mother    Hypertension Mother    Cancer Mother    Kidney disease Mother    Hypertension Father    Cancer Father        Prostate   Breast cancer Maternal Aunt 73   Colon cancer Son    Kidney cancer Neg Hx    Bladder Cancer Neg Hx     Social History   Tobacco Use   Smoking status: Never   Smokeless tobacco: Never  Substance Use Topics   Alcohol use: Yes    Alcohol/week: 1.0 standard drink    Types: 1 Glasses of wine per week     Current Outpatient Medications:    acetaminophen (TYLENOL) 650 MG CR tablet, Take 1,300 mg by mouth at bedtime., Disp: , Rfl:    ALFALFA PO, Take 1 tablet by mouth daily., Disp: , Rfl:    amiodarone (PACERONE) 200 MG tablet, Take 2 tablets (400 mg total) by mouth 2 (two) times daily for 7 days, THEN 1 tablet (200 mg total) 2 (two) times daily for 7 days, THEN 1 tablet (200 mg total) daily for 16 days., Disp: 58 tablet, Rfl: 0   [START ON 01/21/2021] amiodarone (PACERONE) 200 MG tablet, Take 1 tablet (200 mg total) by mouth daily. Startin on 01/21/21 after previous prescription has been completed., Disp: 90 tablet, Rfl: 3   apixaban (ELIQUIS) 5 MG TABS tablet, Take 1 tablet (5 mg total) by mouth 2 (two) times daily., Disp: 30 tablet, Rfl: 11   ascorbic Acid (VITAMIN C) 500 MG CPCR, Take 500 mg by mouth daily., Disp: , Rfl:    B Complex-C (B-COMPLEX WITH VITAMIN C) tablet, Take 1 tablet by mouth 2 (two) times daily., Disp: , Rfl:    BD PEN NEEDLE NANO U/F 32G X 4 MM MISC, USE 4 TIMES DAILY (HUMALOG 3 TIMES DAILY AND LANTUS ONCE DAILY) OFFICE NOTIFIED 08/06/17, Disp: 90 each, Rfl: 1   BLACK ELDERBERRY,BERRY-FLOWER, PO, Take 1 Dose by mouth daily. Liquid, Disp: , Rfl:    Calcium Carbonate (CALCIUM 600  PO), Take 600 mg by mouth daily., Disp: , Rfl:    CALCIUM PO, Take 1 tablet by mouth daily., Disp: , Rfl:    cholecalciferol (VITAMIN D) 1000 UNITS tablet, Take 1,000 Units by mouth daily. , Disp: , Rfl:    clobetasol ointment (TEMOVATE) 0.34 %, APPLY 1 APPLICATION TOPICALLY 2 TIMES DAILY AS NEEDED (BUG BITES)., Disp: 30 g, Rfl: 0   Coenzyme Q10 (COQ10) 200 MG CAPS, Take 200 mg by mouth daily., Disp: , Rfl:    Continuous Blood Gluc Sensor ([place]) MISC, by Does not apply route., Disp: , Rfl:    Elastic Bandages & Supports ([place])  MISC, 1 each by Does not apply route daily., Disp: 2 each, Rfl: 5   estradiol (ESTRACE VAGINAL) 0.1 MG/GM vaginal cream, 1/2 gm once weekly using applicator, apply blueberry sized amount of cream using tip of finger to urethra twice weekly, Disp: 30 g, Rfl: 3   Exenatide ER (BYDUREON BCISE) 2 MG/0.85ML AUIJ, Inject 2 mg into the skin every Sunday., Disp: , Rfl:    fluconazole (DIFLUCAN) 100 MG tablet, Take 100 mg by mouth as needed., Disp: , Rfl:    fluticasone (FLONASE) 50 MCG/ACT nasal spray, PLACE 2 SPRAYS INTO BOTH NOSTRILS DAILY., Disp: 8.5 g, Rfl: 2   furosemide (LASIX) 20 MG tablet, Take 1 tablet (20 mg total) by mouth daily as needed., Disp: 30 tablet, Rfl: 3   GARLIC 3254 PO, Take 9,826 mg by mouth daily., Disp: , Rfl:    Glucosamine 500 MG TABS, Take 500 mg by mouth daily., Disp: , Rfl:    HUMALOG KWIKPEN 100 UNIT/ML KiwkPen, Inject 4-12 Units into the skin 3 (three) times daily as needed (high blood sugar)., Disp: , Rfl: 3   insulin glargine (LANTUS) 100 unit/mL SOPN, Inject 22 Units into the skin daily., Disp: , Rfl:    Krill Oil 1000 MG CAPS, Take 1,000 mg by mouth daily., Disp: , Rfl:    losartan-hydrochlorothiazide (HYZAAR) 100-25 MG tablet, Take 1 tablet by mouth in the morning., Disp: 90 tablet, Rfl: 1   Mag Aspart-Potassium Aspart (POTASSIUM & MAGNESIUM ASPARTAT PO), Take 1 tablet by mouth daily., Disp: ,  Rfl:    metoprolol succinate (TOPROL-XL) 50 MG 24 hr tablet, Take 1 tablet (50 mg total) by mouth 2 (two) times daily. Take with or immediately following a meal., Disp: 180 tablet, Rfl: 2   Multiple Vitamins-Minerals (PRESERVISION AREDS 2) CAPS, Take 1 capsule by mouth 2 (two) times daily., Disp: , Rfl:    nystatin cream (MYCOSTATIN), Apply 1 application topically as needed for dry skin., Disp: , Rfl:    OVER THE COUNTER MEDICATION, Place 1 application into both eyes See admin instructions. Blephadex eyelid foam, apply to eyelids once daily, Disp: , Rfl:    OVER THE COUNTER MEDICATION, Take 1 tablet by mouth daily. Vita-Lea Gold otc supplement, Disp: , Rfl:    OVER THE COUNTER MEDICATION, Take 1 tablet by mouth See admin instructions. Nutriferon otc supplement take Take 1 tablet on Sun, Tues, Thurs, Sat. Take 1 tablet twice daily on Mon, Wed, and Fri, Disp: , Rfl:    Probiotic Product (PROBIOTIC PO), Take 1 capsule by mouth at bedtime., Disp: , Rfl:    PROLIA 60 MG/ML SOSY injection, Inject 60 mg into the skin every 6 (six) months., Disp: , Rfl:    Propylene Glycol (SYSTANE BALANCE OP), Place 1 drop into both eyes daily as needed (dry eyes)., Disp: , Rfl:    rosuvastatin (CRESTOR) 10 MG tablet, Take 10 mg by mouth daily., Disp: , Rfl:    Sodium Chloride-Sodium Bicarb (NETI POT SINUS [place] NA), Place 1 Dose into the nose at bedtime., Disp: , Rfl:    sodium fluoride (FLUORISHIELD) 1.1 % GEL dental gel, Place 1 application onto teeth at bedtime., Disp: , Rfl:    tiZANidine (ZANAFLEX) 2 MG tablet, TAKE 1 TABLET BY MOUTH EVERY 6 HOURS AS NEEDED FOR MUSCLE SPASMS., Disp: 120 tablet, Rfl: 1   Vitamin D, Ergocalciferol, (DRISDOL) 1.25 MG (50000 UT) CAPS capsule, Take 50,000 Units by mouth every Saturday., Disp: , Rfl:   Allergies  Allergen Reactions   Cyclobenzaprine  Hypertension   Cheese Other (See Comments)    bloating   Ciprofloxacin Hcl Other (See Comments)    Muscle pain   Coconut Oil     Upset  stomach   Keflex [Cephalexin] Other (See Comments)    Patient prefers not to take this medication due to the side effects, caused tendon issues    I personally reviewed active problem list, medication list, allergies, family history, social history, health maintenance with the patient/caregiver today.   ROS  Constitutional: Negative for fever or weight change.  Respiratory: Negative for cough and shortness of breath.   Cardiovascular: Negative for chest pain or palpitations.  Gastrointestinal: Negative for abdominal pain, no bowel changes.  Musculoskeletal: Negative for gait problem or joint swelling.  Skin: Negative for rash.  Neurological: Negative for dizziness or headache.  No other specific complaints in a complete review of systems (except as listed in HPI above).   Objective  Vitals:   01/09/21 0802  BP: 128/78  Pulse: 76  Resp: 16  Temp: 98 F (36.7 C)  TempSrc: Oral  SpO2: 95%  Weight: 196 lb (88.9 kg)  Height: 5' 3"  (1.6 m)    Body mass index is 34.72 kg/m.  Physical Exam  Constitutional: Patient appears well-developed and well-nourished. Obese  No distress.  HEENT: head atraumatic, normocephalic, pupils equal and reactive to light, neck supple Cardiovascular: Normal rate, regular rhythm and normal heart sounds.  No murmur heard. Trace  BLE edema, varicose veins. Pulmonary/Chest: Effort normal and breath sounds normal. No respiratory distress. Abdominal: Soft.  There is no tenderness. Psychiatric: Patient has a normal mood and affect. behavior is normal. Judgment and thought content normal.  Muscular skeletal: using a cane for balance Skin: senile purpura on left dorsal hand   Recent Results (from the past 2160 hour(s))  ECHOCARDIOGRAM COMPLETE     Status: None   Collection Time: 10/20/20  1:48 PM  Result Value Ref Range   AR max vel 1.80 cm2   AV Peak grad 7.6 mmHg   Ao pk vel 1.38 m/s   S' Lateral 2.25 cm   Area-P 1/2 2.72 cm2   AV Area VTI 1.66  cm2   AV Mean grad 5.0 mmHg   AV Area mean vel 1.38 cm2   MV VTI 1.49 cm2   MV M vel 4.88 m/s   MV Peak grad 95.3 mmHg  Glucose, capillary     Status: Abnormal   Collection Time: 11/30/20  7:50 AM  Result Value Ref Range   Glucose-Capillary 144 (H) 70 - 99 mg/dL    Comment: Glucose reference range applies only to samples taken after fasting for at least 8 hours.    PHQ2/9: Depression screen [facility] 2/9 01/09/2021 07/12/2020 04/19/2020 01/07/2020 11/25/2019  Decreased Interest 0 0 0 0 0  Down, Depressed, Hopeless 0 0 0 0 0  PHQ - 2 Score 0 0 0 0 0  Altered sleeping - - - 0 0  Tired, decreased energy - - - 0 0  Change in appetite - - - 0 0  Feeling bad or failure about yourself  - - - 0 0  Trouble concentrating - - - 0 0  Moving slowly or fidgety/restless - - - 0 0  Suicidal thoughts - - - 0 0  PHQ-9 Score - - - 0 0  Difficult doing work/chores - - - - Not difficult at all  Some recent data might be hidden    phq 9 is negative  Fall Risk: Fall Risk  01/09/2021 07/12/2020 04/19/2020 01/07/2020 11/25/2019  Falls in the past year? 0 0 0 0 0  Comment - - - - -  Number falls in past yr: 0 0 0 0 0  Injury with Fall? 0 0 0 0 0  Risk for fall due to : - - No Fall Risks - -  Risk for fall due to: Comment - - - - -  Follow up - - Falls prevention discussed - -      Functional Status Survey: Is the patient deaf or have difficulty hearing?: Yes Does the patient have difficulty seeing, even when wearing glasses/contacts?: No Does the patient have difficulty concentrating, remembering, or making decisions?: No Does the patient have difficulty walking or climbing stairs?: Yes Does the patient have difficulty dressing or bathing?: No Does the patient have difficulty doing errands alone such as visiting a doctor's office or shopping?: No    Assessment & Plan  1. 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(HCC)  Stable   2. Dyslipidemia associated with type 2 diabetes mellitus (HCC)  - COMPLETE METABOLIC PANEL  WITH GFR - Hemoglobin A1c  3. Hypertension, benign  - COMPLETE METABOLIC PANEL WITH GFR  4. Muscle spasm of back   5. Atherosclerosis of renal artery ([place])  On statin therapy   6. Morbid obesity ([place])  Discussed with the patient the risk posed by an increased BMI. Discussed importance of portion control, calorie counting and at least 150 minutes of physical activity weekly. Avoid sweet beverages and drink more water. Eat at least 6 servings of fruit and vegetables daily    7. Osteoporosis, post-menopausal   8. Diabetes mellitus type 2 in obese (HCC)   9. Persistent atrial fibrillation ([place])  Doing well   10. Long-term use of high-risk medication  - TSH

## 2021-01-09 ENCOUNTER — Ambulatory Visit (INDEPENDENT_AMBULATORY_CARE_PROVIDER_SITE_OTHER): Payer: Medicare Other | Admitting: Podiatry

## 2021-01-09 ENCOUNTER — Encounter: Payer: Self-pay | Admitting: Family Medicine

## 2021-01-09 ENCOUNTER — Encounter: Payer: Self-pay | Admitting: Podiatry

## 2021-01-09 ENCOUNTER — Ambulatory Visit (INDEPENDENT_AMBULATORY_CARE_PROVIDER_SITE_OTHER): Payer: Medicare Other | Admitting: Family Medicine

## 2021-01-09 ENCOUNTER — Other Ambulatory Visit: Payer: Self-pay

## 2021-01-09 VITALS — BP 128/78 | HR 76 | Temp 98.0°F | Resp 16 | Ht 63.0 in | Wt 196.0 lb

## 2021-01-09 DIAGNOSIS — I701 Atherosclerosis of renal artery: Secondary | ICD-10-CM

## 2021-01-09 DIAGNOSIS — M2042 Other hammer toe(s) (acquired), left foot: Secondary | ICD-10-CM | POA: Diagnosis not present

## 2021-01-09 DIAGNOSIS — Z7901 Long term (current) use of anticoagulants: Secondary | ICD-10-CM

## 2021-01-09 DIAGNOSIS — IMO0002 Reserved for concepts with insufficient information to code with codable children: Secondary | ICD-10-CM

## 2021-01-09 DIAGNOSIS — E113299 Type 2 diabetes mellitus with mild nonproliferative diabetic retinopathy without macular edema, unspecified eye: Secondary | ICD-10-CM

## 2021-01-09 DIAGNOSIS — D692 Other nonthrombocytopenic purpura: Secondary | ICD-10-CM

## 2021-01-09 DIAGNOSIS — I10 Essential (primary) hypertension: Secondary | ICD-10-CM | POA: Diagnosis not present

## 2021-01-09 DIAGNOSIS — E785 Hyperlipidemia, unspecified: Secondary | ICD-10-CM

## 2021-01-09 DIAGNOSIS — M810 Age-related osteoporosis without current pathological fracture: Secondary | ICD-10-CM | POA: Diagnosis not present

## 2021-01-09 DIAGNOSIS — E1169 Type 2 diabetes mellitus with other specified complication: Secondary | ICD-10-CM

## 2021-01-09 DIAGNOSIS — E669 Obesity, unspecified: Secondary | ICD-10-CM

## 2021-01-09 DIAGNOSIS — I4819 Other persistent atrial fibrillation: Secondary | ICD-10-CM

## 2021-01-09 DIAGNOSIS — Q828 Other specified congenital malformations of skin: Secondary | ICD-10-CM

## 2021-01-09 DIAGNOSIS — M62830 Muscle spasm of back: Secondary | ICD-10-CM

## 2021-01-09 DIAGNOSIS — Z79899 Other long term (current) drug therapy: Secondary | ICD-10-CM

## 2021-01-09 NOTE — Patient Instructions (Signed)
Look for urea 40% cream or ointment and apply to the thickened dry skin / calluses. This can be bought over the counter, at a pharmacy or online such as [employer].   The pads we used are aperture pads and a gel toe crest

## 2021-01-09 NOTE — Progress Notes (Signed)
  Subjective:  Patient ID: Bonnie Rowland, female    DOB: [birth year],  MRN: [id number]  Chief Complaint  Patient presents with   Callouses      CORN ON LEFT FOOT    80 y.o. female presents with the above complaint. History confirmed with patient. These are new on the bilateral heels  Objective:  Physical Exam: warm, good capillary refill, no trophic changes or ulcerative lesions, and normal DP and PT pulses. Left lateral heel, right medial heel  porokeratosis painful. Painful left 3rd semi rigid hammertoe  Assessment:   1. Porokeratosis   2. 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 Chronic anticoagulation      Plan:  Patient was evaluated and treated and all questions answered.  All symptomatic hyperkeratoses (bilateral heels only)  were safely debrided with a sterile #15 blade to patient's level of comfort without incident. We discussed preventative and palliative care of these lesions including supportive and accommodative shoegear, padding, prefabricated and custom molded accommodative orthoses, use of a pumice stone and lotions/creams daily.  Silicone toe crest dispensed to offload painful hammertoe  Rec urea cream and aperture pads   No follow-ups on file.

## 2021-01-10 LAB — COMPLETE METABOLIC PANEL WITH GFR
AG Ratio: 1.6 (calc) (ref 1.0–2.5)
ALT: 26 U/L (ref 6–29)
AST: 28 U/L (ref 10–35)
Albumin: 3.8 g/dL (ref 3.6–5.1)
Alkaline phosphatase (APISO): 72 U/L (ref 37–153)
BUN: 13 mg/dL (ref 7–25)
CO2: 34 mmol/L — ABNORMAL HIGH (ref 20–32)
Calcium: 9.3 mg/dL (ref 8.6–10.4)
Chloride: 98 mmol/L (ref 98–110)
Creat: 0.71 mg/dL (ref 0.60–1.00)
Globulin: 2.4 g/dL (calc) (ref 1.9–3.7)
Glucose, Bld: 111 mg/dL — ABNORMAL HIGH (ref 65–99)
Potassium: 4.1 mmol/L (ref 3.5–5.3)
Sodium: 138 mmol/L (ref 135–146)
Total Bilirubin: 1 mg/dL (ref 0.2–1.2)
Total Protein: 6.2 g/dL (ref 6.1–8.1)
eGFR: 87 mL/min/{1.73_m2} (ref >=60)

## 2021-01-10 LAB — TSH: TSH: 4.83 mIU/L — ABNORMAL HIGH (ref 0.40–4.50)

## 2021-01-10 LAB — HEMOGLOBIN A1C
Hgb A1c MFr Bld: 6.5 % of total Hgb — ABNORMAL HIGH (ref <5.7)
Mean Plasma Glucose: 140 mg/dL
eAG (mmol/L): 7.7 mmol/L

## 2021-01-12 DIAGNOSIS — M5416 Radiculopathy, lumbar region: Secondary | ICD-10-CM | POA: Diagnosis not present

## 2021-01-12 DIAGNOSIS — M9903 Segmental and somatic dysfunction of lumbar region: Secondary | ICD-10-CM | POA: Diagnosis not present

## 2021-01-12 DIAGNOSIS — M5033 Other cervical disc degeneration, cervicothoracic region: Secondary | ICD-10-CM | POA: Diagnosis not present

## 2021-01-12 DIAGNOSIS — M9901 Segmental and somatic dysfunction of cervical region: Secondary | ICD-10-CM | POA: Diagnosis not present

## 2021-01-13 ENCOUNTER — Telehealth: Payer: Self-pay

## 2021-01-13 ENCOUNTER — Other Ambulatory Visit: Payer: Self-pay | Admitting: Physician Assistant

## 2021-01-13 NOTE — Progress Notes (Addendum)
Chronic Care Management Pharmacy Assistant   Name: Bonnie Rowland  MRN: [id number] DOB: [birth year]  Reason for Encounter: Hypertension Disease State Call   Recent office visits:  01/09/2021 Steele Sizer, MD (PCP Office Visit) for Follow-up- No Medication Changes Noted  07/12/2020 Steele Sizer, MD (PCP Office Visit) for Follow-up- No Medication Changes Noted  Recent consult visits:  01/09/2021 Lanae Crumbly, DPM (Podiatry) for Follow-up- No Medication Changes Noted  12/22/2020 [name], [name] (Cardiology) For Persistent AFIB-  Start Amiodarone 400 mg twice daily for 1 week, followed by 200 mg twice daily X 1 week, followed by 20 mg daily. Stopped Insulin Glargine 24 units (Patient reported not taking) instead she's taking 22 units Daily; Increased Rosuvastatin 10 mg to Daily- Patient instructed to return in 3-4 weeks.  12/15/2020 Ida Rogue, MD (Cardiology) (Telephone Call) Increased Metoprolol Succinate to 50 mg twice daily  11/04/2020 Ida Rogue, MD (Cardiology) for 1 month follow-up- Increase of Metoprolol Succinate to 75 mg daily, Started Lasix 20 mg prn for swelling; Follow-up in 3 months  10/17/2020 Armandina Gemma, DPM (Podiatry) For Nail Problem) - No Medication Changes Noted  09/26/2020 Ida Rogue, MD (Cardiology) for follow-up- Increase Metoprolol Succinate to 50 mg Daily; Start Eliquis 5 mg twice daily- Echocardiogram Ordered- Follow-up in 1 month  09/13/2020 [name], MD (Endocrinology) For Diabetes Follow-up- No Medication changes noted- Patient instructed to return in 6 months  06/10/2020 Zara Council, [name] (Urology) for Urge incontinence- Doxycycline 100 mg discontinued for completed course. Started Fluconazole 150 mg; Bladder Scan order placed; Patient instructed to follow-up in 1 year.  06/01/2020 Velora Heckler (Orthotics) for Diabetic Shoes and custom inserts  04/18/2021 Lurlean Leyden, DO (Internal Medicine) for Mass- No  Medication Changes Noted  04/11/2021 Lurlean Leyden, DO (Internal Medicine) for Wound Check- Started Doxycycline 100 mg 1 tablet twice daily X 10 Days.  Hospital visits:  Medication Reconciliation was completed by comparing discharge summary, patient's EMR and Pharmacy list, and upon discussion with patient.  Admitted to the hospital on 05/13/2020 due to Abdominal Pain. Discharge date was 05/13/2020. Discharged from [place]?Medications Started at [facility] Discharge:?? -started - None ID  Medication Changes at Hospital Discharge: -Changed None ID  Medications Discontinued at Hospital Discharge: -Stopped None ID  Medications that remain the same after Hospital Discharge:??  -All other medications will remain the same.    Medication Reconciliation was completed by comparing discharge summary, patient's EMR and Pharmacy list, and upon discussion with patient.  Admitted to the hospital on 11/30/2020 due to Cardioversion. Discharge date was 11/30/2020 Discharged from [place]?Medications Started at [facility] Discharge:?? -started - None ID  Medication Changes at Hospital Discharge: -Changed None ID  Medications Discontinued at Hospital Discharge: -Stopped None ID  Medications that remain the same after Hospital Discharge:??  -All other medications will remain the same.   Medications: Outpatient Encounter Medications as of 01/13/2021  Medication Sig   acetaminophen (TYLENOL) 650 MG CR tablet Take 1,300 mg by mouth at bedtime.   ALFALFA PO Take 1 tablet by mouth daily.   amiodarone (PACERONE) 200 MG tablet Take 2 tablets (400 mg total) by mouth 2 (two) times daily for 7 days, THEN 1 tablet (200 mg total) 2 (two) times daily for 7 days, THEN 1 tablet (200 mg total) daily for 16 days.   [START ON 01/21/2021] amiodarone (PACERONE) 200 MG tablet Take 1 tablet (200 mg  total) by mouth daily. Startin  on 01/21/21 after previous prescription has been completed.   apixaban (ELIQUIS) 5 MG TABS tablet Take 1 tablet (5 mg total) by mouth 2 (two) times daily.   ascorbic Acid (VITAMIN C) 500 MG CPCR Take 500 mg by mouth daily.   B Complex-C (B-COMPLEX WITH VITAMIN C) tablet Take 1 tablet by mouth 2 (two) times daily.   BD PEN NEEDLE NANO U/F 32G X 4 MM MISC USE 4 TIMES DAILY (HUMALOG 3 TIMES DAILY AND LANTUS ONCE DAILY) OFFICE NOTIFIED 08/06/17   BLACK ELDERBERRY,BERRY-FLOWER, PO Take 1 Dose by mouth daily. Liquid   Calcium Carbonate (CALCIUM 600 PO) Take 600 mg by mouth daily.   CALCIUM PO Take 1 tablet by mouth daily.   cholecalciferol (VITAMIN D) 1000 UNITS tablet Take 1,000 Units by mouth daily.    clobetasol ointment (TEMOVATE) 3.81 % APPLY 1 APPLICATION TOPICALLY 2 TIMES DAILY AS NEEDED (BUG BITES).   Coenzyme Q10 (COQ10) 200 MG CAPS Take 200 mg by mouth daily.   Continuous Blood Gluc Sensor ([place]) MISC by Does not apply route.   Elastic Bandages & Supports (MEDICAL COMPRESSION STOCKINGS) MISC 1 each by Does not apply route daily.   estradiol (ESTRACE VAGINAL) 0.1 MG/GM vaginal cream 1/2 gm once weekly using applicator, apply blueberry sized amount of cream using tip of finger to urethra twice weekly   Exenatide ER (BYDUREON BCISE) 2 MG/0.85ML AUIJ Inject 2 mg into the skin every Sunday.   fluconazole (DIFLUCAN) 100 MG tablet Take 100 mg by mouth as needed.   fluticasone (FLONASE) 50 MCG/ACT nasal spray PLACE 2 SPRAYS INTO BOTH NOSTRILS DAILY.   furosemide (LASIX) 20 MG tablet Take 1 tablet (20 mg total) by mouth daily as needed.   GARLIC 0175 PO Take 1,025 mg by mouth daily.   Glucosamine 500 MG TABS Take 500 mg by mouth daily.   HUMALOG KWIKPEN 100 UNIT/ML KiwkPen Inject 4-12 Units into the skin 3 (three) times daily as needed (high blood sugar).   insulin glargine (LANTUS) 100 unit/mL SOPN Inject 22 Units into the skin daily.   Krill Oil 1000 MG CAPS Take 1,000 mg by  mouth daily.   losartan-hydrochlorothiazide (HYZAAR) 100-25 MG tablet Take 1 tablet by mouth in the morning.   Mag Aspart-Potassium Aspart (POTASSIUM & MAGNESIUM ASPARTAT PO) Take 1 tablet by mouth daily.   metoprolol succinate (TOPROL-XL) 50 MG 24 hr tablet Take 1 tablet (50 mg total) by mouth 2 (two) times daily. Take with or immediately following a meal.   Multiple Vitamins-Minerals (PRESERVISION AREDS 2) CAPS Take 1 capsule by mouth 2 (two) times daily.   nystatin cream (MYCOSTATIN) Apply 1 application topically as needed for dry skin.   OVER THE COUNTER MEDICATION Place 1 application into both eyes See admin instructions. Blephadex eyelid foam, apply to eyelids once daily   OVER THE COUNTER MEDICATION Take 1 tablet by mouth daily. Vita-Lea Gold otc supplement   OVER THE COUNTER MEDICATION Take 1 tablet by mouth See admin instructions. Nutriferon otc supplement take Take 1 tablet on Sun, Tues, Thurs, Sat. Take 1 tablet twice daily on Mon, Wed, and Fri   Probiotic Product (PROBIOTIC PO) Take 1 capsule by mouth at bedtime.   PROLIA 60 MG/ML SOSY injection Inject 60 mg into the skin every 6 (six) months.   Propylene Glycol (SYSTANE BALANCE OP) Place 1 drop into both eyes daily as needed (dry eyes).   rosuvastatin (CRESTOR) 10 MG tablet Take 10  mg by mouth daily.   Sodium Chloride-Sodium Bicarb (NETI POT SINUS WASH NA) Place 1 Dose into the nose at bedtime.   sodium fluoride (FLUORISHIELD) 1.1 % GEL dental gel Place 1 application onto teeth at bedtime.   tiZANidine (ZANAFLEX) 2 MG tablet TAKE 1 TABLET BY MOUTH EVERY 6 HOURS AS NEEDED FOR MUSCLE SPASMS.   Vitamin D, Ergocalciferol, (DRISDOL) 1.25 MG (50000 UT) CAPS capsule Take 50,000 Units by mouth every Saturday.   No facility-administered encounter medications on file as of 01/13/2021.    Care Gaps: COVID-19 Vaccine (Booster 4), OPHTHALMOLOGY EXAM  Star Rating Drugs: Rosuvastatin 10 mg last filled on 11/28/2020 for a 84 Day supply with [facility]  Pharmacy Losartan-HCTZ 100-25 mg last filled on 11/09/2020 for a 90-Day supply with [facility]  Reviewed chart prior to disease state call. Spoke with patient regarding BP  Recent Office Vitals: BP Readings from Last 3 Encounters:  01/09/21 128/78  12/22/20 120/80  11/30/20 (!) 121/48   Pulse Readings from Last 3 Encounters:  01/09/21 76  12/22/20 94  11/30/20 (!) 58    Wt Readings from Last 3 Encounters:  01/09/21 196 lb (88.9 kg)  12/22/20 197 lb 4 oz (89.5 kg)  11/30/20 198 lb (89.8 kg)     Kidney Function Lab Results  Component Value Date/Time   CREATININE 0.71 01/09/2021 08:36 AM   CREATININE 0.58 05/13/2020 11:59 AM   CREATININE 0.63 04/11/2018 10:32 AM   GFRNONAA >60 05/13/2020 11:59 AM   GFRNONAA 88 04/11/2018 10:32 AM   GFRAA 102 04/11/2018 10:32 AM    BMP Latest Ref Rng & Units 01/09/2021 05/13/2020 04/11/2018  Glucose 65 - 99 mg/dL 111(H) 105(H) 138(H)  BUN 7 - 25 mg/dL 13 15 13   Creatinine 0.60 - 1.00 mg/dL 0.71 0.58 0.63  BUN/Creat Ratio 6 - 22 (calc) NOT APPLICABLE - NOT APPLICABLE  Sodium 737 - 146 mmol/L 138 137 139  Potassium 3.5 - 5.3 mmol/L 4.1 3.5 3.9  Chloride 98 - 110 mmol/L 98 100 100  CO2 20 - 32 mmol/L 34(H) 26 31  Calcium 8.6 - 10.4 mg/dL 9.3 8.9 9.3    Recent Office Vitals: BP Readings from Last 3 Encounters:  01/16/21 (!) 180/94  01/09/21 128/78  12/22/20 120/80   Pulse Readings from Last 3 Encounters:  01/16/21 92  01/09/21 76  12/22/20 94    Wt Readings from Last 3 Encounters:  01/16/21 197 lb (89.4 kg)  01/09/21 196 lb (88.9 kg)  12/22/20 197 lb 4 oz (89.5 kg)     Kidney Function Lab Results  Component Value Date/Time   CREATININE 0.77 01/16/2021 12:13 PM   CREATININE 0.71 01/09/2021 08:36 AM   CREATININE 0.58 05/13/2020 11:59 AM   CREATININE 0.63 04/11/2018 10:32 AM   GFRNONAA >60 05/13/2020 11:59 AM   GFRNONAA 88 04/11/2018 10:32 AM   GFRAA 102 04/11/2018 10:32 AM    BMP Latest Ref Rng & Units 01/16/2021  01/09/2021 05/13/2020  Glucose 65 - 99 mg/dL 120(H) 111(H) 105(H)  BUN 8 - 27 mg/dL 11 13 15   Creatinine 0.57 - 1.00 mg/dL 0.77 0.71 0.58  BUN/Creat Ratio 12 - 28 14 NOT APPLICABLE -  Sodium 106 - 144 mmol/L 140 138 137  Potassium 3.5 - 5.2 mmol/L 4.3 4.1 3.5  Chloride 96 - 106 mmol/L 98 98 100  CO2 20 - 29 mmol/L 26 34(H) 26  Calcium 8.7 - 10.3 mg/dL 9.7 9.3 8.9    Current antihypertensive regimen:  Metoprolol Succinate 50 mg twice  daily Losartan-HCTZ 100-25 mg 1 tablet daily Amiodarone 200 mg 1 tablet twice daily  How often are you checking your Blood Pressure? 3-5x per week  Current home BP readings: 185/90 (she was at her Cardio appt), 07/20 130/20  What recent interventions/DTPs have been made by any provider to improve Blood Pressure control since last CPP Visit: Patient is having a Cardioversion done on Friday this will be her 2nd one  Any recent hospitalizations or ED visits since last visit with CPP? No  What diet changes have been made to improve Blood Pressure Control?  Patient stated that she watches her salt and sodium intake. Other than that she eats pretty normal. She reports that her weight is about the same with no change around 196  What exercise is being done to improve your Blood Pressure Control?  Patient walks 3 neighborhoods daily   Adherence Review: Is the patient currently on ACE/ARB medication? Yes Does the patient have >5 day gap between last estimated fill dates? No  Patient is in need of a visit with Junius Argyle, CPP trying to get it for this month via telephone- I was unable to schedule the patient for this month as the date that was available she was very busy, but I was able to schedule her for 08/10 @ 1400  Patient has a AWV coming up 04/20/2021   07/15 LVM requesting patient to return my call 07/18 LVM requeting patient to return my call 07/19 LVM on pt.'s cell phone requesting that she return my call and her home phone [phone] is no  longer in service  I have attempted without success to contact this patient by phone three times to do her HTN Disease State Call and to get her scheduled with Junius Argyle, CPP as she hasn't been seen by a CPP since 03/30/2020. I left a Voice message for patient to return my call on her cell phone as her home phone number is no longer in service.  Patient returned my call so this assessment has been completed successfully   Lynann Bologna, CPA/CMA Clinical Pharmacist Assistant Phone: [phone]

## 2021-01-15 NOTE — Progress Notes (Signed)
Cardiology Office Note  Date:  01/16/2021   ID:  Bonnie, Rowland [birth year], MRN [id number]  PCP:  Steele Sizer, MD   Chief Complaint  Patient presents with   Follow-up    4 Week follow up and medications verbally reviewed with patient.     HPI:  [name] is a very pleasant 80 y.o. woman with a history of chest pain,  normal coronary arteries by cardiac catheterization April 2007 ,  < 50%  renal artery disease,  check 2021 Carotid 2017 <1-39 b/l hyperlipidemia,  diabetes,  hypertension,  obesity  F/u for atrial flutter, atrial fib  Cardioversion 11/30/2020 Back in atrial fib shortly after the procedure Started on amiodarone in f/u clinic visit  Still in atrial fibrillation on today's visit, having less palpitations, breathing is still an issue, can tell when walking, "harder to breath" Less racing on amiodarone  Interested in cardioversion to restore normal sinus rhythm  BP at home 120/80, typically Elevated here on today's visit, was rushing  Takes lasix PRN, 2-3 x a week on average Legs swollen, compress in place, lymphedema Prior knee surgery on right  EKG personally reviewed by myself on todays visit Shows atrial fibrillation with ventricular rate 92 bpm nonspecific ST abnormality  Other past medical history reviewed Echocardiogram October 20, 2020 normal ejection fraction normal right heart pressures mild MR  Stress: grand daughter with MVA  Lab work reviewed A1C 6.5 Total chol 130  CT ABD/pelvis: Mild aortic atherosclerosis  Prior cardiac studies Renal artery stable 1-59 on the left Carotid ultrasound reviewed from 2017, less than 39% disease bilaterally  02/21/15 she presented to [facility] for right total knee replacement by Dr. Marry Guan. She reported developing tachycardia that morning. She had extreme pain, was rushing to get to the hospital. EKG showed atrial flutter with ventricular rate 140 bpm. She was sent to the emergency room, 3 hours  later was back in normal sinus rhythm. No medications were given to restore normal sinus rhythm.   echocardiogram in May 2006 which was essentially normal with ejection fraction 60%.   Stress test in 06, 03 and 01 had been normal.  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  PSH:    Past Surgical History:  Procedure Laterality Date   APPENDECTOMY     CARDIAC CATHETERIZATION     CARDIOVERSION N/A 11/30/2020   Procedure: CARDIOVERSION;  Surgeon: Minna Merritts, MD;  Location: [facility] ORS;  Service: Cardiovascular;  Laterality: N/A;   cataract surgery     COLONOSCOPY     COLONOSCOPY WITH PROPOFOL N/A 05/09/2016   Procedure: COLONOSCOPY WITH PROPOFOL;  Surgeon: Robert Bellow, MD;  Location: [facility] ENDOSCOPY;  Service: Endoscopy;  Laterality: N/A;   DIAGNOSTIC MAMMOGRAM     EYE SURGERY Bilateral    Cataract Extraction   JOINT REPLACEMENT  2016   KNEE ARTHROPLASTY Right 03/30/2015   Procedure: COMPUTER ASSISTED TOTAL KNEE ARTHROPLASTY;  Surgeon: Dereck Leep, MD;  Location: [facility] ORS;  Service: Orthopedics;  Laterality: Right;   KNEE ARTHROSCOPY Right    KNEE  CLOSED REDUCTION Right 05/23/2015   Procedure: CLOSED MANIPULATION KNEE;  Surgeon: Dereck Leep, MD;  Location: [facility] ORS;  Service: Orthopedics;  Laterality: Right;   TONSILLECTOMY      Current Outpatient Medications  Medication Sig Dispense Refill    acetaminophen (TYLENOL) 650 MG CR tablet Take 1,300 mg by mouth at bedtime.     ALFALFA PO Take 1 tablet by mouth daily.     [START ON 01/21/2021] amiodarone (PACERONE) 200 MG tablet Take 1 tablet (200 mg total) by mouth daily. Startin on 01/21/21 after previous prescription has been completed. 90 tablet 3   amiodarone (PACERONE) 200 MG tablet Take 1 tablet (200 mg total) by mouth daily. 90 tablet 0   apixaban (ELIQUIS) 5 MG TABS tablet Take 1 tablet (5 mg total) by mouth 2 (two) times daily. 30 tablet 11   ascorbic Acid (VITAMIN C) 500 MG CPCR Take 500 mg by mouth daily.     B Complex-C (B-COMPLEX WITH VITAMIN C) tablet Take 1 tablet by mouth 2 (two) times daily.     BD PEN NEEDLE NANO U/F 32G X 4 MM MISC USE 4 TIMES DAILY (HUMALOG 3 TIMES DAILY AND LANTUS ONCE DAILY) OFFICE NOTIFIED 08/06/17 90 each 1   BLACK ELDERBERRY,BERRY-FLOWER, PO Take 1 Dose by mouth daily. Liquid     Calcium Carbonate (CALCIUM 600 PO) Take 600 mg by mouth daily.     CALCIUM PO Take 1 tablet by mouth daily.     cholecalciferol (VITAMIN D) 1000 UNITS tablet Take 1,000 Units by mouth daily.      clobetasol ointment (TEMOVATE) 6.62 % APPLY 1 APPLICATION TOPICALLY 2 TIMES DAILY AS NEEDED (BUG BITES). 30 g 0   Coenzyme Q10 (COQ10) 200 MG CAPS Take 200 mg by mouth daily.     Continuous Blood Gluc Sensor ([place]) MISC by Does not apply route.     Elastic Bandages & Supports (MEDICAL COMPRESSION STOCKINGS) MISC 1 each by Does not apply route daily. 2 each 5   estradiol (ESTRACE VAGINAL) 0.1 MG/GM vaginal cream 1/2 gm once weekly using applicator, apply blueberry sized amount of cream using tip of finger to urethra twice weekly 30 g 3   Exenatide ER (BYDUREON BCISE) 2 MG/0.85ML AUIJ Inject 2 mg into the skin every Sunday.     fluconazole (DIFLUCAN) 100 MG tablet Take 100 mg by mouth as needed.     fluticasone (FLONASE) 50 MCG/ACT nasal spray PLACE 2 SPRAYS INTO BOTH NOSTRILS DAILY. 8.5 g 2   furosemide  (LASIX) 20 MG tablet Take 1 tablet (20 mg total) by mouth daily as needed. 30 tablet 3   GARLIC 9476 PO Take 5,465 mg by mouth daily.     Glucosamine 500 MG TABS Take 500 mg by mouth daily.     HUMALOG KWIKPEN 100 UNIT/ML KiwkPen Inject 4-12 Units into the skin 3 (three) times daily as needed (high blood sugar).  3   insulin glargine (LANTUS) 100 unit/mL SOPN Inject 22 Units into the skin daily.     Krill Oil 1000 MG CAPS Take 1,000 mg by mouth daily.     losartan-hydrochlorothiazide (HYZAAR) 100-25 MG tablet Take 1 tablet by mouth in the morning. 90 tablet 1   Mag Aspart-Potassium Aspart (POTASSIUM & MAGNESIUM ASPARTAT PO) Take 1 tablet by mouth daily.     metoprolol succinate (TOPROL-XL) 50 MG 24 hr tablet Take 1 tablet (50 mg total) by mouth 2 (two) times daily. Take with or immediately following a  meal. 180 tablet 2   Multiple Vitamins-Minerals (PRESERVISION AREDS 2) CAPS Take 1 capsule by mouth 2 (two) times daily.     nystatin cream (MYCOSTATIN) Apply 1 application topically as needed for dry skin.     OVER THE COUNTER MEDICATION Place 1 application into both eyes See admin instructions. Blephadex eyelid foam, apply to eyelids once daily     OVER THE COUNTER MEDICATION Take 1 tablet by mouth daily. Vita-Lea Gold otc supplement     OVER THE COUNTER MEDICATION Take 1 tablet by mouth See admin instructions. Nutriferon otc supplement take Take 1 tablet on Sun, Tues, Thurs, Sat. Take 1 tablet twice daily on Mon, Wed, and Fri     Probiotic Product (PROBIOTIC PO) Take 1 capsule by mouth at bedtime.     PROLIA 60 MG/ML SOSY injection Inject 60 mg into the skin every 6 (six) months.     Propylene Glycol (SYSTANE BALANCE OP) Place 1 drop into both eyes daily as needed (dry eyes).     rosuvastatin (CRESTOR) 10 MG tablet Take 10 mg by mouth daily.     Sodium Chloride-Sodium Bicarb (NETI POT SINUS WASH NA) Place 1 Dose into the nose at bedtime.     sodium fluoride (FLUORISHIELD) 1.1 % GEL dental gel  Place 1 application onto teeth at bedtime.     tiZANidine (ZANAFLEX) 2 MG tablet TAKE 1 TABLET BY MOUTH EVERY 6 HOURS AS NEEDED FOR MUSCLE SPASMS. 120 tablet 1   Vitamin D, Ergocalciferol, (DRISDOL) 1.25 MG (50000 UT) CAPS capsule Take 50,000 Units by mouth every Saturday.     No current facility-administered medications for this visit.     Allergies:   Cyclobenzaprine, Cheese, Ciprofloxacin hcl, Coconut oil, and Keflex [cephalexin]   Social History:  The patient  reports that she has never smoked. She has never used smokeless tobacco. She reports current alcohol use of about 1.0 standard drink of alcohol per week. She reports that she does not use drugs.   Family History:   family history includes Breast cancer (age of onset: 31) in her maternal aunt; Cancer in her father and mother; Colon cancer in her son; Diabetes in her mother; Hypertension in her father and mother; Kidney disease in her mother.    Review of Systems: Review of Systems  Constitutional: Negative.   Eyes: Negative.   Respiratory:  Positive for shortness of breath.   Cardiovascular: Negative.   Gastrointestinal: Negative.   Genitourinary: Negative.   Musculoskeletal:  Positive for joint pain.  Neurological: Negative.   Psychiatric/Behavioral: Negative.    All other systems reviewed and are negative.  PHYSICAL EXAM: VS:  BP (!) 180/94 (BP Location: Left Arm, Patient Position: Sitting, Cuff Size: Normal)   Pulse 92   Ht 5\' 3"  (1.6 m)   Wt 197 lb (89.4 kg)   SpO2 94%   BMI 34.90 kg/m  , BMI Body mass index is 34.9 kg/m. Constitutional:  oriented to person, place, and time. No distress.  HENT:  Head: Grossly normal Eyes:  no discharge. No scleral icterus.  Neck: No JVD, no carotid bruits  Cardiovascular: Irregularly irregular,  no murmurs appreciated Pulmonary/Chest: Clear to auscultation bilaterally, no wheezes or rails Abdominal: Soft.  no distension.  no tenderness.  Musculoskeletal: Normal range of  motion Neurological:  normal muscle tone. Coordination normal. No atrophy Skin: Skin warm and dry Psychiatric: normal affect, pleasant  Recent Labs: 05/13/2020: Hemoglobin 12.5; Platelets 202 01/09/2021: ALT 26; BUN 13; Creat 0.71; Potassium 4.1; Sodium 138;  TSH 4.83    Lipid Panel Lab Results  Component Value Date   CHOL 130 07/12/2020   HDL 75 07/12/2020   LDLCALC 38 07/12/2020   TRIG 86 07/12/2020      Wt Readings from Last 3 Encounters:  01/16/21 197 lb (89.4 kg)  01/09/21 196 lb (88.9 kg)  12/22/20 197 lb 4 oz (89.5 kg)     ASSESSMENT AND PLAN:  Persistent atrial fibrillation Still in atrial fibrillation on today's visit, Continue amiodarone 200 twice daily, cardioversion scheduled later this week Continue metoprolol Eliquis 5 twice daily  Hyperlipidemia, unspecified hyperlipidemia type  Cholesterol is at goal on the current lipid regimen. No changes to the medications were made.  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  Bilateral carotid bruits Less than 39% bilaterally on ultrasound 2017 Medical management   Total encounter time more than 25 minutes Greater than 50% was spent in counseling and coordination of care with the patient   Signed, Esmond Plants, M.D., Ph.D. 01/16/2021  [place],  [place]

## 2021-01-15 NOTE — H&P (View-Only) (Signed)
Cardiology Office Note  Date:  01/16/2021   ID:  Bonnie Rowland, Bonnie Rowland [birth year], MRN [id number]  PCP:  Steele Sizer, MD   Chief Complaint  Patient presents with   Follow-up    4 Week follow up and medications verbally reviewed with patient.     HPI:  Bonnie Rowland is a very pleasant 80 y.o. woman with a history of chest pain,  normal coronary arteries by cardiac catheterization April 2007 ,  < 50%  renal artery disease,  check 2021 Carotid 2017 <1-39 b/l hyperlipidemia,  diabetes,  hypertension,  obesity  F/u for atrial flutter, atrial fib  Cardioversion 11/30/2020 Back in atrial fib shortly after the procedure Started on amiodarone in f/u clinic visit  Still in atrial fibrillation on today's visit, having less palpitations, breathing is still an issue, can tell when walking, "harder to breath" Less racing on amiodarone  Interested in cardioversion to restore normal sinus rhythm  BP at home 120/80, typically Elevated here on today's visit, was rushing  Takes lasix PRN, 2-3 x a week on average Legs swollen, compress in place, lymphedema Prior knee surgery on right  EKG personally reviewed by myself on todays visit Shows atrial fibrillation with ventricular rate 92 bpm nonspecific ST abnormality  Other past medical history reviewed Echocardiogram October 20, 2020 normal ejection fraction normal right heart pressures mild MR  Stress: grand daughter with MVA  Lab work reviewed A1C 6.5 Total chol 130  CT ABD/pelvis: Mild aortic atherosclerosis  Prior cardiac studies Renal artery stable 1-59 on the left Carotid ultrasound reviewed from 2017, less than 39% disease bilaterally  02/21/15 she presented to [facility] for right total knee replacement by Dr. Marry Guan. She reported developing tachycardia that morning. She had extreme pain, was rushing to get to the hospital. EKG showed atrial flutter with ventricular rate 140 bpm. She was sent to the emergency room, 3 hours  later was back in normal sinus rhythm. No medications were given to restore normal sinus rhythm.   echocardiogram in May 2006 which was essentially normal with ejection fraction 60%.   Stress test in 06, 03 and 01 had been normal.  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  PSH:    Past Surgical History:  Procedure Laterality Date   APPENDECTOMY     CARDIAC CATHETERIZATION     CARDIOVERSION N/A 11/30/2020   Procedure: CARDIOVERSION;  Surgeon: Minna Merritts, MD;  Location: [facility] ORS;  Service: Cardiovascular;  Laterality: N/A;   cataract surgery     COLONOSCOPY     COLONOSCOPY WITH PROPOFOL N/A 05/09/2016   Procedure: COLONOSCOPY WITH PROPOFOL;  Surgeon: Robert Bellow, MD;  Location: [facility];  Service: Endoscopy;  Laterality: N/A;   DIAGNOSTIC MAMMOGRAM     EYE SURGERY Bilateral    Cataract Extraction   JOINT REPLACEMENT  2016   KNEE ARTHROPLASTY Right 03/30/2015   Procedure: COMPUTER ASSISTED TOTAL KNEE ARTHROPLASTY;  Surgeon: Dereck Leep, MD;  Location: [facility] ORS;  Service: Orthopedics;  Laterality: Right;   KNEE ARTHROSCOPY Right    KNEE  CLOSED REDUCTION Right 05/23/2015   Procedure: CLOSED MANIPULATION KNEE;  Surgeon: Dereck Leep, MD;  Location: [facility] OR[facility];  Service: Orthopedics;  Laterality: Right;   TONSILLECTOMY      Current Outpatient Medications  Medication Sig Dispense Refill    acetaminophen (TYLENOL) 650 MG CR tablet Take 1,300 mg by mouth at bedtime.     ALFALFA PO Take 1 tablet by mouth daily.     [START ON 01/21/2021] amiodarone (PACERONE) 200 MG tablet Take 1 tablet (200 mg total) by mouth daily. Startin on 01/21/21 after previous prescription has been completed. 90 tablet 3   amiodarone (PACERONE) 200 MG tablet Take 1 tablet (200 mg total) by mouth daily. 90 tablet 0   apixaban (ELIQUIS) 5 MG TABS tablet Take 1 tablet (5 mg total) by mouth 2 (two) times daily. 30 tablet 11   ascorbic Acid (VITAMIN C) 500 MG CPCR Take 500 mg by mouth daily.     B Complex-C (B-COMPLEX WITH VITAMIN C) tablet Take 1 tablet by mouth 2 (two) times daily.     BD PEN NEEDLE NANO U/F 32G X 4 MM MISC USE 4 TIMES DAILY (HUMALOG 3 TIMES DAILY AND LANTUS ONCE DAILY) OFFICE NOTIFIED 08/06/17 90 each 1   BLACK ELDERBERRY,BERRY-FLOWER, PO Take 1 Dose by mouth daily. Liquid     Calcium Carbonate (CALCIUM 600 PO) Take 600 mg by mouth daily.     CALCIUM PO Take 1 tablet by mouth daily.     cholecalciferol (VITAMIN D) 1000 UNITS tablet Take 1,000 Units by mouth daily.      clobetasol ointment (TEMOVATE) 3.90 % APPLY 1 APPLICATION TOPICALLY 2 TIMES DAILY AS NEEDED (BUG BITES). 30 g 0   Coenzyme Q10 (COQ10) 200 MG CAPS Take 200 mg by mouth daily.     Continuous Blood Gluc Sensor ([place]) MISC by Does not apply route.     Elastic Bandages & Supports (MEDICAL COMPRESSION STOCKINGS) MISC 1 each by Does not apply route daily. 2 each 5   estradiol (ESTRACE VAGINAL) 0.1 MG/GM vaginal cream 1/2 gm once weekly using applicator, apply blueberry sized amount of cream using tip of finger to urethra twice weekly 30 g 3   Exenatide ER (BYDUREON BCISE) 2 MG/0.85ML AUIJ Inject 2 mg into the skin every Sunday.     fluconazole (DIFLUCAN) 100 MG tablet Take 100 mg by mouth as needed.     fluticasone (FLONASE) 50 MCG/ACT nasal spray PLACE 2 SPRAYS INTO BOTH NOSTRILS DAILY. 8.5 g 2   furosemide  (LASIX) 20 MG tablet Take 1 tablet (20 mg total) by mouth daily as needed. 30 tablet 3   GARLIC 3009 PO Take 2,330 mg by mouth daily.     Glucosamine 500 MG TABS Take 500 mg by mouth daily.     HUMALOG KWIKPEN 100 UNIT/ML KiwkPen Inject 4-12 Units into the skin 3 (three) times daily as needed (high blood sugar).  3   insulin glargine (LANTUS) 100 unit/mL SOPN Inject 22 Units into the skin daily.     Krill Oil 1000 MG CAPS Take 1,000 mg by mouth daily.     losartan-hydrochlorothiazide (HYZAAR) 100-25 MG tablet Take 1 tablet by mouth in the morning. 90 tablet 1   Mag Aspart-Potassium Aspart (POTASSIUM & MAGNESIUM ASPARTAT PO) Take 1 tablet by mouth daily.     metoprolol succinate (TOPROL-XL) 50 MG 24 hr tablet Take 1 tablet (50 mg total) by mouth 2 (two) times daily. Take with or immediately following a  meal. 180 tablet 2   Multiple Vitamins-Minerals (PRESERVISION AREDS 2) CAPS Take 1 capsule by mouth 2 (two) times daily.     nystatin cream (MYCOSTATIN) Apply 1 application topically as needed for dry skin.     OVER THE COUNTER MEDICATION Place 1 application into both eyes See admin instructions. Blephadex eyelid foam, apply to eyelids once daily     OVER THE COUNTER MEDICATION Take 1 tablet by mouth daily. Vita-Lea Gold otc supplement     OVER THE COUNTER MEDICATION Take 1 tablet by mouth See admin instructions. Nutriferon otc supplement take Take 1 tablet on Sun, Tues, Thurs, Sat. Take 1 tablet twice daily on Mon, Wed, and Fri     Probiotic Product (PROBIOTIC PO) Take 1 capsule by mouth at bedtime.     PROLIA 60 MG/ML SOSY injection Inject 60 mg into the skin every 6 (six) months.     Propylene Glycol (SYSTANE BALANCE OP) Place 1 drop into both eyes daily as needed (dry eyes).     rosuvastatin (CRESTOR) 10 MG tablet Take 10 mg by mouth daily.     Sodium Chloride-Sodium Bicarb (NETI POT SINUS WASH NA) Place 1 Dose into the nose at bedtime.     sodium fluoride (FLUORISHIELD) 1.1 % GEL dental gel  Place 1 application onto teeth at bedtime.     tiZANidine (ZANAFLEX) 2 MG tablet TAKE 1 TABLET BY MOUTH EVERY 6 HOURS AS NEEDED FOR MUSCLE SPASMS. 120 tablet 1   Vitamin D, Ergocalciferol, (DRISDOL) 1.25 MG (50000 UT) CAPS capsule Take 50,000 Units by mouth every Saturday.     No current facility-administered medications for this visit.     Allergies:   Cyclobenzaprine, Cheese, Ciprofloxacin hcl, Coconut oil, and Keflex [cephalexin]   Social History:  The patient  reports that she has never smoked. She has never used smokeless tobacco. She reports current alcohol use of about 1.0 standard drink of alcohol per week. She reports that she does not use drugs.   Family History:   family history includes Breast cancer (age of onset: 30) in her maternal aunt; Cancer in her father and mother; Colon cancer in her son; Diabetes in her mother; Hypertension in her father and mother; Kidney disease in her mother.    Review of Systems: Review of Systems  Constitutional: Negative.   Eyes: Negative.   Respiratory:  Positive for shortness of breath.   Cardiovascular: Negative.   Gastrointestinal: Negative.   Genitourinary: Negative.   Musculoskeletal:  Positive for joint pain.  Neurological: Negative.   Psychiatric/Behavioral: Negative.    All other systems reviewed and are negative.  PHYSICAL EXAM: VS:  BP (!) 180/94 (BP Location: Left Arm, Patient Position: Sitting, Cuff Size: Normal)   Pulse 92   Ht 5\' 3"  (1.6 m)   Wt 197 lb (89.4 kg)   SpO2 94%   BMI 34.90 kg/m  , BMI Body mass index is 34.9 kg/m. Constitutional:  oriented to person, place, and time. No distress.  HENT:  Head: Grossly normal Eyes:  no discharge. No scleral icterus.  Neck: No JVD, no carotid bruits  Cardiovascular: Irregularly irregular,  no murmurs appreciated Pulmonary/Chest: Clear to auscultation bilaterally, no wheezes or rails Abdominal: Soft.  no distension.  no tenderness.  Musculoskeletal: Normal range of  motion Neurological:  normal muscle tone. Coordination normal. No atrophy Skin: Skin warm and dry Psychiatric: normal affect, pleasant  Recent Labs: 05/13/2020: Hemoglobin 12.5; Platelets 202 01/09/2021: ALT 26; BUN 13; Creat 0.71; Potassium 4.1; Sodium 138;  TSH 4.83    Lipid Panel Lab Results  Component Value Date   CHOL 130 07/12/2020   HDL 75 07/12/2020   LDLCALC 38 07/12/2020   TRIG 86 07/12/2020      Wt Readings from Last 3 Encounters:  01/16/21 197 lb (89.4 kg)  01/09/21 196 lb (88.9 kg)  12/22/20 197 lb 4 oz (89.5 kg)     ASSESSMENT AND PLAN:  Persistent atrial fibrillation Still in atrial fibrillation on today's visit, Continue amiodarone 200 twice daily, cardioversion scheduled later this week Continue metoprolol Eliquis 5 twice daily  Hyperlipidemia, unspecified hyperlipidemia type  Cholesterol is at goal on the current lipid regimen. No changes to the medications were made.  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  Bilateral carotid bruits Less than 39% bilaterally on ultrasound 2017 Medical management   Total encounter time more than 25 minutes Greater than 50% was spent in counseling and coordination of care with the patient   Signed, Esmond Plants, M.D., Ph.D. 01/16/2021  [place],  [place]

## 2021-01-16 ENCOUNTER — Encounter: Payer: Self-pay | Admitting: Cardiovascular Disease

## 2021-01-16 ENCOUNTER — Ambulatory Visit (INDEPENDENT_AMBULATORY_CARE_PROVIDER_SITE_OTHER): Payer: Medicare Other | Admitting: Cardiovascular Disease

## 2021-01-16 ENCOUNTER — Other Ambulatory Visit: Payer: Self-pay

## 2021-01-16 VITALS — BP 180/94 | HR 92 | Ht 63.0 in | Wt 197.0 lb

## 2021-01-16 DIAGNOSIS — I4819 Other persistent atrial fibrillation: Secondary | ICD-10-CM | POA: Diagnosis not present

## 2021-01-16 DIAGNOSIS — Z01818 Encounter for other preprocedural examination: Secondary | ICD-10-CM

## 2021-01-16 DIAGNOSIS — E785 Hyperlipidemia, unspecified: Secondary | ICD-10-CM | POA: Diagnosis not present

## 2021-01-16 DIAGNOSIS — I10 Essential (primary) hypertension: Secondary | ICD-10-CM | POA: Diagnosis not present

## 2021-01-16 DIAGNOSIS — I493 Ventricular premature depolarization: Secondary | ICD-10-CM

## 2021-01-16 DIAGNOSIS — I6523 Occlusion and stenosis of bilateral carotid arteries: Secondary | ICD-10-CM | POA: Diagnosis not present

## 2021-01-16 DIAGNOSIS — I4892 Unspecified atrial flutter: Secondary | ICD-10-CM | POA: Diagnosis not present

## 2021-01-16 DIAGNOSIS — Z79899 Other long term (current) drug therapy: Secondary | ICD-10-CM | POA: Diagnosis not present

## 2021-01-16 DIAGNOSIS — Z01810 Encounter for preprocedural cardiovascular examination: Secondary | ICD-10-CM | POA: Diagnosis not present

## 2021-01-16 DIAGNOSIS — I701 Atherosclerosis of renal artery: Secondary | ICD-10-CM | POA: Diagnosis not present

## 2021-01-16 DIAGNOSIS — M7989 Other specified soft tissue disorders: Secondary | ICD-10-CM | POA: Diagnosis not present

## 2021-01-16 MED ORDER — AMIODARONE HCL 200 MG PO TABS: 200.0000 mg | ORAL_TABLET | Freq: Two times a day (BID) | ORAL | 3 refills | Status: DC

## 2021-01-16 MED ORDER — ROSUVASTATIN CALCIUM 10 MG PO TABS: 10.0000 mg | ORAL_TABLET | Freq: Every day | ORAL | 3 refills | Status: DC

## 2021-01-16 MED ORDER — LOSARTAN POTASSIUM-HCTZ 100-25 MG PO TABS
1.0000 | ORAL_TABLET | Freq: Every morning | ORAL | 3 refills | Status: DC
Start: 2021-01-16 — End: 2021-01-31

## 2021-01-16 MED ORDER — FUROSEMIDE 20 MG PO TABS: 20.0000 mg | ORAL_TABLET | Freq: Every day | ORAL | 3 refills | Status: DC | PRN

## 2021-01-16 MED ORDER — METOPROLOL SUCCINATE ER 50 MG PO TB24: 50.0000 mg | ORAL_TABLET | Freq: Two times a day (BID) | ORAL | 3 refills | Status: DC

## 2021-01-16 NOTE — Patient Instructions (Signed)
Medication Instructions:  Please increase the amiodarone up to 200 mg twice a day  If you need a refill on your cardiac medications before your next appointment, please call your pharmacy.    Lab work: No new labs needed  Testing/Procedures: Cardioversion 01/20/2021   Follow-Up: At [facility], you and your health needs are our priority.  As part of our continuing mission to provide you with exceptional heart care, we have created designated Provider Care Teams.  These Care Teams include your primary Cardiologist (physician) and Advanced Practice Providers (APPs -  Physician Assistants and Nurse Practitioners) who all work together to provide you with the care you need, when you need it.  You will need a follow up appointment in 12 months  Providers on your designated Care Team:   Murray Hodgkins, NP [name], [name] Marrianne Mood, [name] Cadence Elba, [place]   COVID-19 Vaccine Information can be found at: ShippingScam.co.uk For questions related to vaccine distribution or appointments, please email vaccine@[email or web address] .[email or web address] or call [phone].    Cardioversion  Date: 01/20/2021  Arrive at 06:30 am   Start time 07:30 am  with Dr. Rockey Situ  Please arrive at the [place] [facility], free valet parking is available  [place], [place], [place] [address]  INSTRUCTIONS:   Labs: CBC & BMP (within 30 days)  Nothing to eat or drink after midnight  May take your night time medications with a small sip of  water                  Medications:  HOLD: Lasix and Insulin Take only half dose Insulin at night YOU MAY TAKE ALL of your remaining medications with a small amount of water. Continue your anticoagulant: Eliquis You will need to continue your anticoagulant after your procedure until you are told by your provider that it is safe to stop  Must have a responsible person to drive you home. They must stay in the  waiting area during your procedure.  Failure to do so could result in cancellation.  Bring a current list of your medications and current insurance cards.   If you have any questions after you get home, please call the office at [phone]  FYI: For your safety, and to allow [place] to monitor your vital signs accurately during the surgery/procedure we request that  if you have artificial nails, gel coating, SNS etc. Please have those removed prior to your surgery/procedure. Not having the nail coverings /polish removed may result in cancellation or delay of your surgery/procedure.

## 2021-01-17 DIAGNOSIS — H3554 Dystrophies primarily involving the retinal pigment epithelium: Secondary | ICD-10-CM | POA: Diagnosis not present

## 2021-01-17 LAB — BASIC METABOLIC PANEL
BUN/Creatinine Ratio: 14 (ref 12–28)
BUN: 11 mg/dL (ref 8–27)
CO2: 26 mmol/L (ref 20–29)
Calcium: 9.7 mg/dL (ref 8.7–10.3)
Chloride: 98 mmol/L (ref 96–106)
Creatinine, Ser: 0.77 mg/dL (ref 0.57–1.00)
Glucose: 120 mg/dL — ABNORMAL HIGH (ref 65–99)
Potassium: 4.3 mmol/L (ref 3.5–5.2)
Sodium: 140 mmol/L (ref 134–144)
eGFR: 79 mL/min/{1.73_m2} (ref >59)

## 2021-01-17 LAB — CBC
Hematocrit: 42.3 % (ref 34.0–46.6)
Hemoglobin: 13.1 g/dL (ref 11.1–15.9)
MCH: 30.3 pg (ref 26.6–33.0)
MCHC: 31 g/dL — ABNORMAL LOW (ref 31.5–35.7)
MCV: 98 fL — ABNORMAL HIGH (ref 79–97)
Platelets: 242 10*3/uL (ref 150–450)
RBC: 4.32 x10E6/uL (ref 3.77–5.28)
RDW: 12.3 % (ref 11.7–15.4)
WBC: 6.6 10*3/uL (ref 3.4–10.8)

## 2021-01-17 LAB — HM DIABETES EYE EXAM

## 2021-01-18 ENCOUNTER — Other Ambulatory Visit: Payer: Self-pay | Admitting: Cardiovascular Disease

## 2021-01-19 DIAGNOSIS — M5416 Radiculopathy, lumbar region: Secondary | ICD-10-CM | POA: Diagnosis not present

## 2021-01-19 DIAGNOSIS — M9901 Segmental and somatic dysfunction of cervical region: Secondary | ICD-10-CM | POA: Diagnosis not present

## 2021-01-19 DIAGNOSIS — M5033 Other cervical disc degeneration, cervicothoracic region: Secondary | ICD-10-CM | POA: Diagnosis not present

## 2021-01-19 DIAGNOSIS — M9903 Segmental and somatic dysfunction of lumbar region: Secondary | ICD-10-CM | POA: Diagnosis not present

## 2021-01-20 ENCOUNTER — Ambulatory Visit: Payer: Medicare Other | Admitting: Certified Registered"

## 2021-01-20 ENCOUNTER — Encounter: Payer: Self-pay | Admitting: Family Medicine

## 2021-01-20 ENCOUNTER — Encounter: Payer: Self-pay | Admitting: Cardiovascular Disease

## 2021-01-20 ENCOUNTER — Ambulatory Visit (HOSPITAL_BASED_OUTPATIENT_CLINIC_OR_DEPARTMENT_OTHER)
Admission: RE | Admit: 2021-01-20 | Discharge: 2021-01-20 | Disposition: A | Discharge status: Home / Self Care | Payer: Medicare Other | Source: Home / Self Care | Attending: Cardiovascular Disease | Admitting: Cardiovascular Disease

## 2021-01-20 ENCOUNTER — Encounter
Admission: RE | Disposition: A | Discharge status: Home / Self Care | Payer: Self-pay | Source: Home / Self Care | Attending: Cardiovascular Disease

## 2021-01-20 DIAGNOSIS — Z794 Long term (current) use of insulin: Secondary | ICD-10-CM | POA: Insufficient documentation

## 2021-01-20 DIAGNOSIS — Z7901 Long term (current) use of anticoagulants: Secondary | ICD-10-CM | POA: Insufficient documentation

## 2021-01-20 DIAGNOSIS — I701 Atherosclerosis of renal artery: Secondary | ICD-10-CM | POA: Insufficient documentation

## 2021-01-20 DIAGNOSIS — Z79899 Other long term (current) drug therapy: Secondary | ICD-10-CM | POA: Insufficient documentation

## 2021-01-20 DIAGNOSIS — I4892 Unspecified atrial flutter: Secondary | ICD-10-CM | POA: Insufficient documentation

## 2021-01-20 DIAGNOSIS — Z6834 Body mass index (BMI) 34.0-34.9, adult: Secondary | ICD-10-CM | POA: Insufficient documentation

## 2021-01-20 DIAGNOSIS — Z7984 Long term (current) use of oral hypoglycemic drugs: Secondary | ICD-10-CM | POA: Insufficient documentation

## 2021-01-20 DIAGNOSIS — Z66 Do not resuscitate: Secondary | ICD-10-CM | POA: Diagnosis not present

## 2021-01-20 DIAGNOSIS — I10 Essential (primary) hypertension: Secondary | ICD-10-CM | POA: Diagnosis not present

## 2021-01-20 DIAGNOSIS — E113299 Type 2 diabetes mellitus with mild nonproliferative diabetic retinopathy without macular edema, unspecified eye: Secondary | ICD-10-CM | POA: Insufficient documentation

## 2021-01-20 DIAGNOSIS — R0989 Other specified symptoms and signs involving the circulatory and respiratory systems: Secondary | ICD-10-CM | POA: Insufficient documentation

## 2021-01-20 DIAGNOSIS — Z96651 Presence of right artificial knee joint: Secondary | ICD-10-CM | POA: Insufficient documentation

## 2021-01-20 DIAGNOSIS — E669 Obesity, unspecified: Secondary | ICD-10-CM | POA: Insufficient documentation

## 2021-01-20 DIAGNOSIS — R0602 Shortness of breath: Secondary | ICD-10-CM | POA: Diagnosis not present

## 2021-01-20 DIAGNOSIS — R0902 Hypoxemia: Secondary | ICD-10-CM | POA: Diagnosis not present

## 2021-01-20 DIAGNOSIS — E785 Hyperlipidemia, unspecified: Secondary | ICD-10-CM | POA: Insufficient documentation

## 2021-01-20 DIAGNOSIS — Z85828 Personal history of other malignant neoplasm of skin: Secondary | ICD-10-CM | POA: Insufficient documentation

## 2021-01-20 DIAGNOSIS — Z85820 Personal history of malignant melanoma of skin: Secondary | ICD-10-CM | POA: Insufficient documentation

## 2021-01-20 DIAGNOSIS — Z881 Allergy status to other antibiotic agents status: Secondary | ICD-10-CM | POA: Insufficient documentation

## 2021-01-20 DIAGNOSIS — I5043 Acute on chronic combined systolic (congestive) and diastolic (congestive) heart failure: Secondary | ICD-10-CM | POA: Diagnosis not present

## 2021-01-20 DIAGNOSIS — Z888 Allergy status to other drugs, medicaments and biological substances status: Secondary | ICD-10-CM | POA: Insufficient documentation

## 2021-01-20 DIAGNOSIS — I4891 Unspecified atrial fibrillation: Secondary | ICD-10-CM | POA: Diagnosis not present

## 2021-01-20 DIAGNOSIS — E1165 Type 2 diabetes mellitus with hyperglycemia: Secondary | ICD-10-CM | POA: Insufficient documentation

## 2021-01-20 DIAGNOSIS — J9601 Acute respiratory failure with hypoxia: Secondary | ICD-10-CM | POA: Diagnosis not present

## 2021-01-20 DIAGNOSIS — Z79890 Hormone replacement therapy (postmenopausal): Secondary | ICD-10-CM | POA: Insufficient documentation

## 2021-01-20 DIAGNOSIS — R918 Other nonspecific abnormal finding of lung field: Secondary | ICD-10-CM | POA: Diagnosis not present

## 2021-01-20 DIAGNOSIS — I110 Hypertensive heart disease with heart failure: Secondary | ICD-10-CM | POA: Diagnosis not present

## 2021-01-20 DIAGNOSIS — Z20822 Contact with and (suspected) exposure to covid-19: Secondary | ICD-10-CM | POA: Diagnosis not present

## 2021-01-20 DIAGNOSIS — J810 Acute pulmonary edema: Secondary | ICD-10-CM | POA: Diagnosis not present

## 2021-01-20 DIAGNOSIS — I4819 Other persistent atrial fibrillation: Secondary | ICD-10-CM

## 2021-01-20 HISTORY — PX: CARDIOVERSION: SHX1299

## 2021-01-20 LAB — GLUCOSE, CAPILLARY: Glucose-Capillary: 131 mg/dL — ABNORMAL HIGH (ref 70–99)

## 2021-01-20 SURGERY — CARDIOVERSION
Anesthesia: General

## 2021-01-20 MED ORDER — SODIUM CHLORIDE 0.9 % IV SOLN: INTRAVENOUS | Status: DC | PRN

## 2021-01-20 MED ORDER — PROPOFOL 10 MG/ML IV BOLUS
INTRAVENOUS | Status: DC | PRN
  Administered 2021-01-20: 50 mg via INTRAVENOUS

## 2021-01-20 MED ORDER — PROPOFOL 10 MG/ML IV BOLUS
INTRAVENOUS | Status: AC
  Filled 2021-01-20: qty 20

## 2021-01-20 NOTE — Anesthesia Postprocedure Evaluation (Signed)
Anesthesia Post Note  Patient: Bonnie Rowland  Procedure(s) Performed: CARDIOVERSION  Patient location during evaluation: Specials Recovery Anesthesia Type: General Level of consciousness: awake and alert Pain management: pain level controlled Vital Signs Assessment: post-procedure vital signs reviewed and stable Respiratory status: spontaneous breathing, nonlabored ventilation, respiratory function stable and patient connected to nasal cannula oxygen Cardiovascular status: blood pressure returned to baseline and stable Postop Assessment: no apparent nausea or vomiting Anesthetic complications: no   No notable events documented.   Last Vitals:  Vitals:   01/20/21 0741 01/20/21 0745  BP: (!) 119/54 (!) 121/47  Pulse: (!) 48 (!) 45  Resp: 15 (!) 24  Temp:    SpO2: 100% 100%    Last Pain:  Vitals:   01/20/21 0715  TempSrc: Oral  PainSc: 0-No pain                 Martha Clan

## 2021-01-20 NOTE — CV Procedure (Signed)
Cardioversion procedure note For atrial fibrillation, persistent  Procedure Details:  Consent: Risks of procedure as well as the alternatives and risks of each were explained to the (patient/caregiver).  Consent for procedure obtained.  Time Out: Verified patient identification, verified procedure, site/side was marked, verified correct patient position, special equipment/implants available, medications/allergies/relevent history reviewed, required imaging and test results available.  Performed  Patient placed on cardiac monitor, pulse oximetry, supplemental oxygen as necessary.   Sedation given: propofol IV, Dr. Karenz Pacer pads placed anterior and posterior chest.   Cardioverted 1 time(s).   Cardioverted at  150 J. Synchronized biphasic Converted to NSR   Evaluation: Findings: Post procedure EKG shows: NSR Complications: None Patient did tolerate procedure well.  Time Spent Directly with the Patient:  45 minutes   Tim Tymel Conely, M.D., Ph.D.  

## 2021-01-20 NOTE — Anesthesia Preprocedure Evaluation (Signed)
Anesthesia Evaluation  Patient identified by MRN, date of birth, ID band Patient awake    Reviewed: Allergy & Precautions, NPO status , Patient's Chart, lab work & pertinent test results  History of Anesthesia Complications Negative for: history of anesthetic complications  Airway Mallampati: II       Dental  (+) Dental Advidsory Given   Pulmonary neg shortness of breath, neg sleep apnea, neg COPD, neg recent URI, Not current smoker,           Cardiovascular hypertension, Pt. on medications (-) angina(-) Past MI and (-) CHF + dysrhythmias Atrial Fibrillation + Valvular Problems/Murmurs (murmur, no tx)      Neuro/Psych neg Seizures    GI/Hepatic Neg liver ROS, GERD  Controlled,  Endo/Other  diabetes, Type 2, Oral Hypoglycemic Agents  Renal/GU negative Renal ROS     Musculoskeletal   Abdominal   Peds  Hematology   Anesthesia Other Findings Past Medical History: No date: Acute cystitis No date: Allergic rhinitis, cause unspecified 2015: Arthritis No date: Atherosclerosis of renal artery ([facility])     Comment:  left No date: Bronchitis, not specified as acute or chronic 12/2013: Cancer ([place])     Comment:  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; left shoulder blade 05/2014: Cancer ([place])     Comment:  basal cell removed left temple 2003: Cataract No date: Cellulitis and abscess of leg, except foot No date: Conjunctivitis unspecified No date: Dermatophytosis of nail No date: Diabetes mellitus     Comment:  type II No date: Esophageal reflux No date: Hyperlipidemia No date: Hypertension 2016: Osteoporosis No date: 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) No date: Renal artery stenosis ([facility]) No date: Sprain of lumbar region No date: Thoracic or lumbosacral neuritis or radiculitis, unspecified No date: Urinary tract infection, site not specified   Reproductive/Obstetrics                             Anesthesia  Physical  Anesthesia Plan  ASA: 3  Anesthesia Plan: General   Post-op Pain Management:    Induction: Intravenous  PONV Risk Score and Plan: 3 and Propofol infusion and TIVA  Airway Management Planned: Nasal Cannula  Additional Equipment:   Intra-op Plan:   Post-operative Plan:   Informed Consent: I have reviewed the patients History and Physical, chart, labs and discussed the procedure including the risks, benefits and alternatives for the proposed anesthesia with the patient or authorized representative who has indicated his/her understanding and acceptance.       Plan Discussed with:   Anesthesia Plan Comments:         Anesthesia Quick Evaluation

## 2021-01-20 NOTE — Transfer of Care (Signed)
Immediate Anesthesia Transfer of Care Note  Patient: Bonnie Rowland  Procedure(s) Performed: CARDIOVERSION  Patient Location: PACU and Cath Lab  Anesthesia Type:General  Level of Consciousness: drowsy  Airway & Oxygen Therapy: Patient Spontanous Breathing and Patient connected to nasal cannula oxygen  Post-op Assessment: Report given to RN  Post vital signs: stable  Last Vitals:  Vitals Value Taken Time  BP 127/53 01/20/21 0736  Temp    Pulse 50 01/20/21 0738  Resp 22 01/20/21 0738  SpO2 98 % 01/20/21 0738    Last Pain:  Vitals:   01/20/21 0715  TempSrc: Oral  PainSc: 0-No pain         Complications: No notable events documented.

## 2021-01-21 ENCOUNTER — Telehealth: Payer: Self-pay | Admitting: Medical

## 2021-01-21 NOTE — Telephone Encounter (Signed)
Patient underwent cardioversion 7/22 for persistent afib and Dr. Rockey Situ said watch for heart beats int he 54s. She has been feeling lightheaded noted low heart rate: HR 50, 47 and sometime into the 30s. O2 level went down to 80s and then goes back up to 93. BP is good 123/62.  She took Toprol-XL '50mg'$  this am, she also takes '50mg'$  it at night. Also on amiodarone '200mg'$  daily.  I instructed her to stop the Toprol XL . Reassuring that oxygen goes back up into the 90s. Continue to monitor and call back if there is no change tomorrow. No chest pain or SOB. LLE unchanged. Will inform Dr. Rockey Situ. Call back if needed.   Seri Kimmer Kathlen Mody, [name]

## 2021-01-22 ENCOUNTER — Emergency Department: Payer: Medicare Other

## 2021-01-22 ENCOUNTER — Encounter: Payer: Self-pay | Admitting: Radiology

## 2021-01-22 ENCOUNTER — Other Ambulatory Visit: Payer: Self-pay | Admitting: Internal Medicine

## 2021-01-22 DIAGNOSIS — R0902 Hypoxemia: Secondary | ICD-10-CM | POA: Diagnosis not present

## 2021-01-22 DIAGNOSIS — E113299 Type 2 diabetes mellitus with mild nonproliferative diabetic retinopathy without macular edema, unspecified eye: Secondary | ICD-10-CM | POA: Diagnosis present

## 2021-01-22 DIAGNOSIS — I4819 Other persistent atrial fibrillation: Secondary | ICD-10-CM | POA: Diagnosis present

## 2021-01-22 DIAGNOSIS — J810 Acute pulmonary edema: Secondary | ICD-10-CM | POA: Diagnosis not present

## 2021-01-22 DIAGNOSIS — Z7984 Long term (current) use of oral hypoglycemic drugs: Secondary | ICD-10-CM

## 2021-01-22 DIAGNOSIS — R0602 Shortness of breath: Secondary | ICD-10-CM | POA: Diagnosis not present

## 2021-01-22 DIAGNOSIS — M810 Age-related osteoporosis without current pathological fracture: Secondary | ICD-10-CM | POA: Diagnosis present

## 2021-01-22 DIAGNOSIS — I701 Atherosclerosis of renal artery: Secondary | ICD-10-CM | POA: Diagnosis present

## 2021-01-22 DIAGNOSIS — E876 Hypokalemia: Secondary | ICD-10-CM | POA: Diagnosis present

## 2021-01-22 DIAGNOSIS — Z7901 Long term (current) use of anticoagulants: Secondary | ICD-10-CM

## 2021-01-22 DIAGNOSIS — I161 Hypertensive emergency: Secondary | ICD-10-CM | POA: Diagnosis present

## 2021-01-22 DIAGNOSIS — Z7982 Long term (current) use of aspirin: Secondary | ICD-10-CM

## 2021-01-22 DIAGNOSIS — J9601 Acute respiratory failure with hypoxia: Secondary | ICD-10-CM | POA: Diagnosis present

## 2021-01-22 DIAGNOSIS — Z794 Long term (current) use of insulin: Secondary | ICD-10-CM

## 2021-01-22 DIAGNOSIS — Z20822 Contact with and (suspected) exposure to covid-19: Secondary | ICD-10-CM | POA: Diagnosis present

## 2021-01-22 DIAGNOSIS — I428 Other cardiomyopathies: Secondary | ICD-10-CM | POA: Diagnosis present

## 2021-01-22 DIAGNOSIS — Z8249 Family history of ischemic heart disease and other diseases of the circulatory system: Secondary | ICD-10-CM

## 2021-01-22 DIAGNOSIS — Z79899 Other long term (current) drug therapy: Secondary | ICD-10-CM

## 2021-01-22 DIAGNOSIS — Z833 Family history of diabetes mellitus: Secondary | ICD-10-CM

## 2021-01-22 DIAGNOSIS — K219 Gastro-esophageal reflux disease without esophagitis: Secondary | ICD-10-CM | POA: Diagnosis present

## 2021-01-22 DIAGNOSIS — I4892 Unspecified atrial flutter: Secondary | ICD-10-CM | POA: Diagnosis present

## 2021-01-22 DIAGNOSIS — E785 Hyperlipidemia, unspecified: Secondary | ICD-10-CM | POA: Diagnosis present

## 2021-01-22 DIAGNOSIS — I10 Essential (primary) hypertension: Secondary | ICD-10-CM | POA: Diagnosis not present

## 2021-01-22 DIAGNOSIS — Z66 Do not resuscitate: Secondary | ICD-10-CM | POA: Diagnosis present

## 2021-01-22 DIAGNOSIS — Z800 Family history of malignant neoplasm of digestive organs: Secondary | ICD-10-CM

## 2021-01-22 DIAGNOSIS — I2510 Atherosclerotic heart disease of native coronary artery without angina pectoris: Secondary | ICD-10-CM | POA: Diagnosis present

## 2021-01-22 DIAGNOSIS — I5043 Acute on chronic combined systolic (congestive) and diastolic (congestive) heart failure: Secondary | ICD-10-CM | POA: Diagnosis present

## 2021-01-22 DIAGNOSIS — Z803 Family history of malignant neoplasm of breast: Secondary | ICD-10-CM

## 2021-01-22 DIAGNOSIS — E1169 Type 2 diabetes mellitus with other specified complication: Secondary | ICD-10-CM | POA: Diagnosis present

## 2021-01-22 DIAGNOSIS — E871 Hypo-osmolality and hyponatremia: Secondary | ICD-10-CM | POA: Diagnosis present

## 2021-01-22 DIAGNOSIS — I890 Lymphedema, not elsewhere classified: Secondary | ICD-10-CM | POA: Diagnosis present

## 2021-01-22 DIAGNOSIS — I248 Other forms of acute ischemic heart disease: Secondary | ICD-10-CM | POA: Diagnosis present

## 2021-01-22 DIAGNOSIS — I110 Hypertensive heart disease with heart failure: Principal | ICD-10-CM | POA: Diagnosis present

## 2021-01-22 DIAGNOSIS — E1165 Type 2 diabetes mellitus with hyperglycemia: Secondary | ICD-10-CM | POA: Diagnosis present

## 2021-01-22 DIAGNOSIS — Z96651 Presence of right artificial knee joint: Secondary | ICD-10-CM | POA: Diagnosis present

## 2021-01-22 DIAGNOSIS — E872 Acidosis: Secondary | ICD-10-CM | POA: Diagnosis present

## 2021-01-22 NOTE — Interval H&P Note (Signed)
History and Physical Interval Note:  01/22/2021 10:42 AM  [name]  has presented today for surgery, with the diagnosis of Cardioversion  Afib.  The various methods of treatment have been discussed with the patient and family. After consideration of risks, benefits and other options for treatment, the patient has consented to  Procedure(s): CARDIOVERSION (N/A) as a surgical intervention.  The patient's history has been reviewed, patient examined, no change in status, stable for surgery.  I have reviewed the patient's chart and labs.  Questions were answered to the patient's satisfaction.     Bonnie Rowland

## 2021-01-22 NOTE — ED Notes (Signed)
Patient transported to X-ray 

## 2021-01-22 NOTE — Progress Notes (Signed)
Patient called to report that she has been experiencing difficulty breathing over the last 24 hours or so. Had an ablation done recently and was on amiodarone and metoprolol.  She called yesterday given heart rates in the 40s and low oxygen levels, and was recommended to stop the metoprolol given bradycardia.  Today she notes that her blood pressures have been in the [id number] to A999333 systolic, heart rates have been in the 60s, and oxygen levels have also been in the low 60s 70s.  She repeats that she has been having difficulty breathing.  This, I have recommended that she come to the emergency department urgently to get evaluated.  She states that her husband will drive her into the emergency department and they live 4 miles away.  Billey Chang, MD Cardiology Fellow

## 2021-01-22 NOTE — ED Triage Notes (Signed)
Patient reports cardioversion Friday. Patient reports sudden onset shortness of breath, and hypertension today. Patient is noted to have difficulty breathing with speaking. Patient alert, oriented x4.

## 2021-01-22 NOTE — ED Notes (Signed)
Pt's spouse states he is concerned pt is not safe in lobby due to illness. Pt sitting next to first rn on oxygen. Spouse assured charge rn is actively working onseeing which pt can move out to a hallway bed to place his wife in room.

## 2021-01-22 NOTE — H&P (Signed)
H&P Addendum, pre-cardioversion  Patient was seen and evaluated prior to -cardioversion procedure Symptoms, prior testing details again confirmed with the patient Patient examined, no significant change from prior exam Lab work reviewed in detail personally by myself Patient understands risk and benefit of the procedure, willing to proceed  Signed, Tim Rylee Huestis, MD, Ph.D [facility]  

## 2021-01-22 NOTE — ED Notes (Signed)
Dr. Charna Archer notified of abnormal BP and O2 readings.

## 2021-01-23 ENCOUNTER — Other Ambulatory Visit: Payer: Self-pay

## 2021-01-23 ENCOUNTER — Emergency Department: Payer: Medicare Other

## 2021-01-23 ENCOUNTER — Inpatient Hospital Stay
Admission: EM | Admit: 2021-01-23 | Discharge: 2021-01-31 | DRG: 286 | Disposition: A | Discharge status: Home Health | Payer: Medicare Other | Attending: Internal Medicine | Admitting: Internal Medicine

## 2021-01-23 ENCOUNTER — Inpatient Hospital Stay (HOSPITAL_COMMUNITY)
Admit: 2021-01-23 | Discharge: 2021-01-23 | Disposition: A | Discharge status: Home / Self Care | Payer: Medicare Other | Attending: Internal Medicine | Admitting: Internal Medicine

## 2021-01-23 DIAGNOSIS — I4819 Other persistent atrial fibrillation: Secondary | ICD-10-CM

## 2021-01-23 DIAGNOSIS — R0902 Hypoxemia: Secondary | ICD-10-CM

## 2021-01-23 DIAGNOSIS — I480 Paroxysmal atrial fibrillation: Secondary | ICD-10-CM

## 2021-01-23 DIAGNOSIS — Z66 Do not resuscitate: Secondary | ICD-10-CM | POA: Diagnosis present

## 2021-01-23 DIAGNOSIS — I10 Essential (primary) hypertension: Secondary | ICD-10-CM

## 2021-01-23 DIAGNOSIS — I701 Atherosclerosis of renal artery: Secondary | ICD-10-CM | POA: Diagnosis present

## 2021-01-23 DIAGNOSIS — I161 Hypertensive emergency: Secondary | ICD-10-CM

## 2021-01-23 DIAGNOSIS — J810 Acute pulmonary edema: Secondary | ICD-10-CM

## 2021-01-23 DIAGNOSIS — I5042 Chronic combined systolic (congestive) and diastolic (congestive) heart failure: Secondary | ICD-10-CM

## 2021-01-23 DIAGNOSIS — I2510 Atherosclerotic heart disease of native coronary artery without angina pectoris: Secondary | ICD-10-CM | POA: Diagnosis not present

## 2021-01-23 DIAGNOSIS — I5041 Acute combined systolic (congestive) and diastolic (congestive) heart failure: Secondary | ICD-10-CM | POA: Diagnosis not present

## 2021-01-23 DIAGNOSIS — Z833 Family history of diabetes mellitus: Secondary | ICD-10-CM | POA: Diagnosis not present

## 2021-01-23 DIAGNOSIS — E119 Type 2 diabetes mellitus without complications: Secondary | ICD-10-CM

## 2021-01-23 DIAGNOSIS — R778 Other specified abnormalities of plasma proteins: Secondary | ICD-10-CM

## 2021-01-23 DIAGNOSIS — E876 Hypokalemia: Secondary | ICD-10-CM | POA: Diagnosis not present

## 2021-01-23 DIAGNOSIS — Z20822 Contact with and (suspected) exposure to covid-19: Secondary | ICD-10-CM | POA: Diagnosis present

## 2021-01-23 DIAGNOSIS — Z800 Family history of malignant neoplasm of digestive organs: Secondary | ICD-10-CM | POA: Diagnosis not present

## 2021-01-23 DIAGNOSIS — E871 Hypo-osmolality and hyponatremia: Secondary | ICD-10-CM | POA: Diagnosis present

## 2021-01-23 DIAGNOSIS — E1165 Type 2 diabetes mellitus with hyperglycemia: Secondary | ICD-10-CM | POA: Diagnosis present

## 2021-01-23 DIAGNOSIS — J9601 Acute respiratory failure with hypoxia: Secondary | ICD-10-CM

## 2021-01-23 DIAGNOSIS — I5043 Acute on chronic combined systolic (congestive) and diastolic (congestive) heart failure: Secondary | ICD-10-CM | POA: Diagnosis not present

## 2021-01-23 DIAGNOSIS — R7989 Other specified abnormal findings of blood chemistry: Secondary | ICD-10-CM

## 2021-01-23 DIAGNOSIS — Z96651 Presence of right artificial knee joint: Secondary | ICD-10-CM | POA: Diagnosis present

## 2021-01-23 DIAGNOSIS — I248 Other forms of acute ischemic heart disease: Secondary | ICD-10-CM | POA: Diagnosis present

## 2021-01-23 DIAGNOSIS — R0602 Shortness of breath: Secondary | ICD-10-CM

## 2021-01-23 DIAGNOSIS — I5031 Acute diastolic (congestive) heart failure: Secondary | ICD-10-CM | POA: Diagnosis not present

## 2021-01-23 DIAGNOSIS — I4892 Unspecified atrial flutter: Secondary | ICD-10-CM | POA: Diagnosis present

## 2021-01-23 DIAGNOSIS — I428 Other cardiomyopathies: Secondary | ICD-10-CM | POA: Diagnosis present

## 2021-01-23 DIAGNOSIS — I160 Hypertensive urgency: Secondary | ICD-10-CM

## 2021-01-23 DIAGNOSIS — E785 Hyperlipidemia, unspecified: Secondary | ICD-10-CM | POA: Diagnosis not present

## 2021-01-23 DIAGNOSIS — R918 Other nonspecific abnormal finding of lung field: Secondary | ICD-10-CM | POA: Diagnosis not present

## 2021-01-23 DIAGNOSIS — I4891 Unspecified atrial fibrillation: Secondary | ICD-10-CM | POA: Diagnosis not present

## 2021-01-23 DIAGNOSIS — Z803 Family history of malignant neoplasm of breast: Secondary | ICD-10-CM | POA: Diagnosis not present

## 2021-01-23 DIAGNOSIS — Z8249 Family history of ischemic heart disease and other diseases of the circulatory system: Secondary | ICD-10-CM | POA: Diagnosis not present

## 2021-01-23 DIAGNOSIS — I890 Lymphedema, not elsewhere classified: Secondary | ICD-10-CM | POA: Diagnosis present

## 2021-01-23 DIAGNOSIS — E1169 Type 2 diabetes mellitus with other specified complication: Secondary | ICD-10-CM | POA: Diagnosis not present

## 2021-01-23 DIAGNOSIS — I110 Hypertensive heart disease with heart failure: Secondary | ICD-10-CM | POA: Diagnosis present

## 2021-01-23 DIAGNOSIS — E872 Acidosis: Secondary | ICD-10-CM | POA: Diagnosis present

## 2021-01-23 DIAGNOSIS — E113299 Type 2 diabetes mellitus with mild nonproliferative diabetic retinopathy without macular edema, unspecified eye: Secondary | ICD-10-CM | POA: Diagnosis present

## 2021-01-23 DIAGNOSIS — I5032 Chronic diastolic (congestive) heart failure: Secondary | ICD-10-CM

## 2021-01-23 HISTORY — DX: Acute respiratory failure with hypoxia: J96.01

## 2021-01-23 HISTORY — DX: Acute on chronic combined systolic (congestive) and diastolic (congestive) heart failure: I50.43

## 2021-01-23 HISTORY — DX: Paroxysmal atrial fibrillation: I48.0

## 2021-01-23 LAB — BLOOD GAS, ARTERIAL
Acid-Base Excess: 1.4 mmol/L (ref 0.0–2.0)
Acid-base deficit: 0.7 mmol/L (ref 0.0–2.0)
Bicarbonate: 24.2 mmol/L (ref 20.0–28.0)
Bicarbonate: 27.7 mmol/L (ref 20.0–28.0)
Delivery systems: POSITIVE
Expiratory PAP: 5
Expiratory PAP: 5
FIO2: 65
FIO2: 65
Inspiratory PAP: 10
Inspiratory PAP: 10
Mechanical Rate: 10
Mode: POSITIVE
O2 Saturation: 89 %
O2 Saturation: 98.6 %
Patient temperature: 37
Patient temperature: 37
pCO2 arterial: 40 mmHg (ref 32.0–48.0)
pCO2 arterial: 49 mmHg — ABNORMAL HIGH (ref 32.0–48.0)
pH, Arterial: 7.36 (ref 7.350–7.450)
pH, Arterial: 7.39 (ref 7.350–7.450)
pO2, Arterial: 119 mmHg — ABNORMAL HIGH (ref 83.0–108.0)
pO2, Arterial: 59 mmHg — ABNORMAL LOW (ref 83.0–108.0)

## 2021-01-23 LAB — COMPREHENSIVE METABOLIC PANEL
ALT: 34 U/L (ref 0–44)
AST: 42 U/L — ABNORMAL HIGH (ref 15–41)
Albumin: 4 g/dL (ref 3.5–5.0)
Alkaline Phosphatase: 81 U/L (ref 38–126)
Anion gap: 10 (ref 5–15)
BUN: 21 mg/dL (ref 8–23)
CO2: 28 mmol/L (ref 22–32)
Calcium: 9 mg/dL (ref 8.9–10.3)
Chloride: 96 mmol/L — ABNORMAL LOW (ref 98–111)
Creatinine, Ser: 0.88 mg/dL (ref 0.44–1.00)
GFR, Estimated: >60 mL/min (ref >60)
Glucose, Bld: 284 mg/dL — ABNORMAL HIGH (ref 70–99)
Potassium: 4.3 mmol/L (ref 3.5–5.1)
Sodium: 134 mmol/L — ABNORMAL LOW (ref 135–145)
Total Bilirubin: 1.6 mg/dL — ABNORMAL HIGH (ref 0.3–1.2)
Total Protein: 7.1 g/dL (ref 6.5–8.1)

## 2021-01-23 LAB — ECHOCARDIOGRAM COMPLETE: S' Lateral: 2.89 cm

## 2021-01-23 LAB — CBC
HCT: 40.7 % (ref 36.0–46.0)
Hemoglobin: 13.7 g/dL (ref 12.0–15.0)
MCH: 32.2 pg (ref 26.0–34.0)
MCHC: 33.7 g/dL (ref 30.0–36.0)
MCV: 95.8 fL (ref 80.0–100.0)
Platelets: 222 10*3/uL (ref 150–400)
RBC: 4.25 MIL/uL (ref 3.87–5.11)
RDW: 13.2 % (ref 11.5–15.5)
WBC: 11.3 10*3/uL — ABNORMAL HIGH (ref 4.0–10.5)
nRBC: 0 % (ref 0.0–0.2)

## 2021-01-23 LAB — CBG MONITORING, ED
Glucose-Capillary: 272 mg/dL — ABNORMAL HIGH (ref 70–99)
Glucose-Capillary: 198 mg/dL — ABNORMAL HIGH (ref 70–99)
Glucose-Capillary: 263 mg/dL — ABNORMAL HIGH (ref 70–99)
Glucose-Capillary: 180 mg/dL — ABNORMAL HIGH (ref 70–99)

## 2021-01-23 LAB — SARS CORONAVIRUS 2 (TAT 6-24 HRS): SARS Coronavirus 2: NEGATIVE

## 2021-01-23 LAB — TROPONIN I (HIGH SENSITIVITY)
Troponin I (High Sensitivity): 10 ng/L (ref <18)
Troponin I (High Sensitivity): 87 ng/L — ABNORMAL HIGH (ref <18)
Troponin I (High Sensitivity): 692 ng/L (ref <18)

## 2021-01-23 LAB — PROCALCITONIN: Procalcitonin: <0.1 ng/mL

## 2021-01-23 LAB — LACTIC ACID, PLASMA
Lactic Acid, Venous: 3 mmol/L (ref 0.5–1.9)
Lactic Acid, Venous: 3 mmol/L (ref 0.5–1.9)

## 2021-01-23 LAB — BRAIN NATRIURETIC PEPTIDE: B Natriuretic Peptide: 237.3 pg/mL — ABNORMAL HIGH (ref 0.0–100.0)

## 2021-01-23 MED ORDER — ONDANSETRON HCL 4 MG PO TABS: 4.0000 mg | ORAL_TABLET | Freq: Four times a day (QID) | ORAL | Status: DC | PRN

## 2021-01-23 MED ORDER — TIZANIDINE HCL 2 MG PO TABS
2.0000 mg | ORAL_TABLET | Freq: Every day | ORAL | Status: DC
  Administered 2021-01-23 – 2021-01-30 (×8): 2 mg via ORAL
  Filled 2021-01-23 (×9): qty 1

## 2021-01-23 MED ORDER — INSULIN ASPART 100 UNIT/ML IJ SOLN
0.0000 [IU] | Freq: Every day | INTRAMUSCULAR | Status: DC
  Administered 2021-01-25: 2 [IU] via SUBCUTANEOUS
  Administered 2021-01-26: 3 [IU] via SUBCUTANEOUS
  Administered 2021-01-27: 2 [IU] via SUBCUTANEOUS
  Administered 2021-01-28: 4 [IU] via SUBCUTANEOUS
  Administered 2021-01-29 – 2021-01-30 (×2): 2 [IU] via SUBCUTANEOUS
  Filled 2021-01-23 (×5): qty 1

## 2021-01-23 MED ORDER — PROBIOTIC PO TBEC: DELAYED_RELEASE_TABLET | Freq: Every day | ORAL | Status: DC

## 2021-01-23 MED ORDER — FUROSEMIDE 10 MG/ML IJ SOLN
40.0000 mg | Freq: Two times a day (BID) | INTRAMUSCULAR | Status: DC
Start: 2021-01-23 — End: 2021-01-29
  Administered 2021-01-23 – 2021-01-29 (×13): 40 mg via INTRAVENOUS
  Filled 2021-01-23 (×13): qty 4

## 2021-01-23 MED ORDER — INSULIN GLARGINE 100 UNIT/ML ~~LOC~~ SOLN
15.0000 [IU] | Freq: Every day | SUBCUTANEOUS | Status: DC
  Administered 2021-01-23 – 2021-01-26 (×4): 15 [IU] via SUBCUTANEOUS
  Filled 2021-01-23 (×6): qty 0.15

## 2021-01-23 MED ORDER — ACETAMINOPHEN 325 MG PO TABS
650.0000 mg | ORAL_TABLET | Freq: Four times a day (QID) | ORAL | Status: DC | PRN
  Administered 2021-01-25: 650 mg via ORAL
  Filled 2021-01-23 (×2): qty 2

## 2021-01-23 MED ORDER — HEPARIN (PORCINE) 25000 UT/250ML-% IV SOLN
800.0000 [IU]/h | INTRAVENOUS | Status: DC
  Administered 2021-01-24: 1000 [IU]/h via INTRAVENOUS
  Filled 2021-01-23: qty 250

## 2021-01-23 MED ORDER — AMIODARONE HCL 200 MG PO TABS
200.0000 mg | ORAL_TABLET | Freq: Two times a day (BID) | ORAL | Status: DC
  Administered 2021-01-23: 200 mg via ORAL
  Filled 2021-01-23 (×3): qty 1

## 2021-01-23 MED ORDER — HEPARIN BOLUS VIA INFUSION
3500.0000 [IU] | Freq: Once | INTRAVENOUS | Status: AC
  Administered 2021-01-24: 3500 [IU] via INTRAVENOUS
  Filled 2021-01-23: qty 3500

## 2021-01-23 MED ORDER — ASPIRIN EC 81 MG PO TBEC
81.0000 mg | DELAYED_RELEASE_TABLET | Freq: Every day | ORAL | Status: DC
  Administered 2021-01-23 – 2021-01-26 (×4): 81 mg via ORAL
  Filled 2021-01-23 (×4): qty 1

## 2021-01-23 MED ORDER — INSULIN ASPART 100 UNIT/ML IJ SOLN
0.0000 [IU] | Freq: Three times a day (TID) | INTRAMUSCULAR | Status: DC
  Administered 2021-01-23: 8 [IU] via SUBCUTANEOUS
  Administered 2021-01-23: 3 [IU] via SUBCUTANEOUS
  Administered 2021-01-23: 8 [IU] via SUBCUTANEOUS
  Administered 2021-01-24 (×2): 2 [IU] via SUBCUTANEOUS
  Administered 2021-01-24: 3 [IU] via SUBCUTANEOUS
  Administered 2021-01-25: 5 [IU] via SUBCUTANEOUS
  Administered 2021-01-26: 3 [IU] via SUBCUTANEOUS
  Administered 2021-01-26: 5 [IU] via SUBCUTANEOUS
  Administered 2021-01-26 – 2021-01-28 (×5): 3 [IU] via SUBCUTANEOUS
  Administered 2021-01-28: 15 [IU] via SUBCUTANEOUS
  Administered 2021-01-28: 2 [IU] via SUBCUTANEOUS
  Administered 2021-01-29: 5 [IU] via SUBCUTANEOUS
  Administered 2021-01-29: 3 [IU] via SUBCUTANEOUS
  Administered 2021-01-29: 5 [IU] via SUBCUTANEOUS
  Administered 2021-01-30: 8 [IU] via SUBCUTANEOUS
  Administered 2021-01-30 – 2021-01-31 (×2): 3 [IU] via SUBCUTANEOUS
  Administered 2021-01-31: 5 [IU] via SUBCUTANEOUS
  Filled 2021-01-23 (×23): qty 1

## 2021-01-23 MED ORDER — LEVOFLOXACIN IN D5W 750 MG/150ML IV SOLN
750.0000 mg | Freq: Once | INTRAVENOUS | Status: AC
  Administered 2021-01-23: 750 mg via INTRAVENOUS
  Filled 2021-01-23: qty 150

## 2021-01-23 MED ORDER — FLUTICASONE PROPIONATE 50 MCG/ACT NA SUSP
2.0000 | Freq: Every day | NASAL | Status: DC | PRN
  Filled 2021-01-23: qty 16

## 2021-01-23 MED ORDER — ACETAMINOPHEN 650 MG RE SUPP: 650.0000 mg | Freq: Four times a day (QID) | RECTAL | Status: DC | PRN

## 2021-01-23 MED ORDER — AMIODARONE HCL 200 MG PO TABS
200.0000 mg | ORAL_TABLET | Freq: Two times a day (BID) | ORAL | Status: DC
  Filled 2021-01-23: qty 1

## 2021-01-23 MED ORDER — LOSARTAN POTASSIUM 50 MG PO TABS
100.0000 mg | ORAL_TABLET | Freq: Every day | ORAL | Status: DC
  Administered 2021-01-23: 100 mg via ORAL
  Filled 2021-01-23: qty 2

## 2021-01-23 MED ORDER — INSULIN GLARGINE 100 UNITS/ML SOLOSTAR PEN: 15.0000 [IU] | PEN_INJECTOR | Freq: Every day | SUBCUTANEOUS | Status: DC

## 2021-01-23 MED ORDER — ROSUVASTATIN CALCIUM 10 MG PO TABS
10.0000 mg | ORAL_TABLET | Freq: Every day | ORAL | Status: DC
  Administered 2021-01-23 – 2021-01-30 (×8): 10 mg via ORAL
  Filled 2021-01-23 (×8): qty 1

## 2021-01-23 MED ORDER — RISAQUAD PO CAPS
1.0000 | ORAL_CAPSULE | Freq: Every day | ORAL | Status: DC
  Administered 2021-01-24 – 2021-01-28 (×5): 1 via ORAL
  Filled 2021-01-23 (×7): qty 1

## 2021-01-23 MED ORDER — PROPYLENE GLYCOL 0.6 % OP SOLN: Freq: Every day | OPHTHALMIC | Status: DC | PRN

## 2021-01-23 MED ORDER — METOPROLOL TARTRATE 25 MG PO TABS
12.5000 mg | ORAL_TABLET | Freq: Four times a day (QID) | ORAL | Status: DC
  Administered 2021-01-23 – 2021-01-25 (×6): 12.5 mg via ORAL
  Filled 2021-01-23 (×5): qty 1

## 2021-01-23 MED ORDER — FUROSEMIDE 10 MG/ML IJ SOLN
40.0000 mg | Freq: Once | INTRAMUSCULAR | Status: AC
  Administered 2021-01-23: 40 mg via INTRAVENOUS
  Filled 2021-01-23: qty 4

## 2021-01-23 MED ORDER — APIXABAN 5 MG PO TABS: 5.0000 mg | ORAL_TABLET | Freq: Two times a day (BID) | ORAL | Status: DC

## 2021-01-23 MED ORDER — PRESERVISION AREDS 2 PO CAPS: 1.0000 | ORAL_CAPSULE | Freq: Two times a day (BID) | ORAL | Status: DC

## 2021-01-23 MED ORDER — MORPHINE SULFATE (PF) 2 MG/ML IV SOLN
2.0000 mg | Freq: Once | INTRAVENOUS | Status: AC
Start: 2021-01-23 — End: 2021-01-23
  Administered 2021-01-23: 2 mg via INTRAVENOUS
  Filled 2021-01-23: qty 1

## 2021-01-23 MED ORDER — APIXABAN 5 MG PO TABS
5.0000 mg | ORAL_TABLET | Freq: Two times a day (BID) | ORAL | Status: DC
  Administered 2021-01-23: 5 mg via ORAL
  Filled 2021-01-23: qty 1

## 2021-01-23 MED ORDER — ENOXAPARIN SODIUM 60 MG/0.6ML IJ SOSY
0.5000 mg/kg | PREFILLED_SYRINGE | INTRAMUSCULAR | Status: DC
  Administered 2021-01-23: 45 mg via SUBCUTANEOUS
  Filled 2021-01-23: qty 0.6

## 2021-01-23 MED ORDER — ONDANSETRON HCL 4 MG/2ML IJ SOLN: 4.0000 mg | Freq: Four times a day (QID) | INTRAMUSCULAR | Status: DC | PRN

## 2021-01-23 MED ORDER — POLYVINYL ALCOHOL 1.4 % OP SOLN
2.0000 [drp] | OPHTHALMIC | Status: DC | PRN
  Filled 2021-01-23: qty 15

## 2021-01-23 MED ORDER — OCUVITE-LUTEIN PO CAPS
1.0000 | ORAL_CAPSULE | Freq: Two times a day (BID) | ORAL | Status: DC
  Administered 2021-01-24 – 2021-01-28 (×6): 1 via ORAL
  Filled 2021-01-23 (×16): qty 1

## 2021-01-23 NOTE — ED Notes (Signed)
hospitalist in to see pt.

## 2021-01-23 NOTE — H&P (Signed)
History and Physical    Bonnie Rowland [id number] DOB: 06/10/1941 DOA: 01/23/2021  PCP: Steele Sizer, MD   Patient coming from: Home  I have personally briefly reviewed patient's old medical records in [place]  Chief Complaint: Shortness of breath  HPI: Bonnie Rowland is a 80 y.o. female with medical history significant for DM, HTN, persistent A. fib status post cardioversion on 7/22 who presented to the emergency room at the recommendation of cardiology fellow due to difficulty breathing.  Patient initially called the cardiology service on 7/23 with a complaint of lightheadedness.  At the time she reported bradycardia and occasional hypoxia but with good blood pressure.  She was instructed to continue her amiodarone and stop the Toprol.  She was otherwise without chest pain or shortness of breath.  On the night of arrival however she started experiencing difficulty breathing and was thus advised to present to the emergency room.  She denied chest pain, cough, fever or chills.  Had no nausea, vomiting or diaphoresis, no abdominal pain or diarrhea  ED course: On arrival, afebrile BP 204/116 with pulse of 74 respirations 36 with O2 sats 77 on 2 L subsequently being placed on BiPAP ABG on BiPAP with pH 7.36, PCO2 of 49 and PO2 59 WBC 11,000 with hemoglobin 13.7, troponin 10, BNP 237 ABG subsequently improved on BiPAP to pH 7.39, PCO2 of 40 and PO2 119  EKG, personally viewed and interpreted sinus rhythm at 71 with nonspecific ST-T wave changes  Imaging: Mild bibasilar atelectasis and/or infiltrate with a mild component of superimposed interstitial edema  Patient was treated with IV Lasix and IV morphine as well as BiPAP with symptomatic improvement.  Hospitalist consulted for admission.  Review of Systems: As per HPI otherwise all other systems on review of systems negative.    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)    Renal artery stenosis (HCC)    Sprain of lumbar region    Thoracic or lumbosacral neuritis or radiculitis, unspecified    Urinary tract infection, site not specified     Past Surgical History:  Procedure Laterality Date   APPENDECTOMY     CARDIAC CATHETERIZATION     CARDIOVERSION N/A 11/30/2020   Procedure: CARDIOVERSION;  Surgeon: Minna Merritts, MD;  Location: [facility] ORS;  Service: Cardiovascular;  Laterality: N/A;   CARDIOVERSION N/A 01/20/2021   Procedure: CARDIOVERSION;  Surgeon: Minna Merritts, MD;  Location: [facility] OR[facility];  Service: Cardiovascular;  Laterality: N/A;   cataract surgery     COLONOSCOPY     COLONOSCOPY WITH PROPOFOL N/A 05/09/2016   Procedure: COLONOSCOPY WITH PROPOFOL;  Surgeon: Robert Bellow, MD;  Location: [facility] END[facility]COPY;  Service: Endoscopy;  Laterality: N/A;   DIAGNOSTIC MAMMOGRAM     EYE SURGERY Bilateral    Cataract Extraction  JOINT REPLACEMENT  2016   KNEE ARTHROPLASTY Right 03/30/2015   Procedure: COMPUTER ASSISTED TOTAL KNEE ARTHROPLASTY;  Surgeon: Dereck Leep, MD;  Location: [facility] ORS;  Service: Orthopedics;  Laterality: Right;   KNEE ARTHROSCOPY Right    KNEE CLOSED REDUCTION Right 05/23/2015   Procedure: CLOSED MANIPULATION KNEE;  Surgeon: Dereck Leep, MD;  Location: [facility] ORS;  Service: Orthopedics;  Laterality: Right;   TONSILLECTOMY       reports that she has never smoked. She has never used smokeless tobacco. She reports previous  alcohol use of about 1.0 standard drink of alcohol per week. She reports that she does not use drugs.  Allergies  Allergen Reactions   Cyclobenzaprine Hypertension   Cheese Other (See Comments)    bloating   Ciprofloxacin Hcl Other (See Comments)    Muscle pain   Coconut Oil     Upset stomach   Keflex [Cephalexin] Other (See Comments)    Patient prefers not to take this medication due to the side effects, caused tendon issues    Family History  Problem Relation Age of Onset   Diabetes Mother    Hypertension Mother    Cancer Mother    Kidney disease Mother    Hypertension Father    Cancer Father        Prostate   Breast cancer Maternal Aunt 41   Colon cancer Son    Kidney cancer Neg Hx    Bladder Cancer Neg Hx       Prior to Admission medications   Medication Sig Start Date End Date Taking? Authorizing Provider  acetaminophen (TYLENOL) 650 MG CR tablet Take 1,300 mg by mouth at bedtime.    [provider]  ALFALFA PO Take 1 tablet by mouth daily.    [provider]  amiodarone (PACERONE) 200 MG tablet Take 1 tablet (200 mg total) by mouth 2 (two) times daily. 01/16/21   Minna Merritts, MD  apixaban (ELIQUIS) 5 MG TABS tablet Take 1 tablet (5 mg total) by mouth 2 (two) times daily. 09/26/20   Minna Merritts, MD  ascorbic Acid (VITAMIN C) 500 MG CPCR Take 500 mg by mouth daily.    [provider]  B Complex-C (B-COMPLEX WITH VITAMIN C) tablet Take 1 tablet by mouth 2 (two) times daily.    [provider]  BD PEN NEEDLE NANO U/F 32G X 4 MM MISC USE 4 TIMES DAILY (HUMALOG 3 TIMES DAILY AND LANTUS ONCE DAILY) OFFICE NOTIFIED 08/06/17 12/07/18   Steele Sizer, MD  BLACK ELDERBERRY,BERRY-FLOWER, PO Take 15 mLs by mouth daily. Liquid    [provider]  Calcium Carbonate (CALCIUM 600 PO) Take 600 mg by mouth daily.    [provider]  cholecalciferol (VITAMIN D) 1000 UNITS tablet Take 1,000 Units by mouth daily. Shaklee     [provider]  clobetasol ointment (TEMOVATE) [id number] % APPLY 1 APPLICATION TOPICALLY 2 TIMES DAILY AS NEEDED (BUG BITES). Patient taking differently: Apply 1 application topically daily as needed (Bug bites). 12/06/20   Steele Sizer, MD  Coenzyme Q10 (COQ10) 200 MG CAPS Take 200 mg by mouth daily.    [provider]  Continuous Blood Gluc Sensor ([place]) MISC by Does not apply route.    [provider]  Elastic Bandages & Supports (MEDICAL COMPRESSION STOCKINGS) [place] 1 each by Does not apply route daily. 01/08/19   Steele Sizer, MD  estradiol (ESTRACE VAGINAL) 0.1 MG/GM vaginal cream 1/2 gm  once weekly using applicator, apply blueberry sized amount of cream using tip of finger to urethra twice weekly Patient taking differently: Place 1 Applicatorful vaginally See admin instructions. 1/2 gm once weekly using applicator, apply blueberry sized amount of cream using tip of finger to urethra three weekly 06/10/20   Zara Council A, [name]  Exenatide ER (BYDUREON BCISE) 2 MG/0.85ML AUIJ Inject 2 mg into the skin every Sunday. 02/26/20   [provider]  fluticasone (FLONASE) 50 MCG/ACT nasal spray PLACE 2 SPRAYS INTO BOTH NOSTRILS DAILY. Patient taking differently: Place 2 sprays into both nostrils daily as needed for rhinitis. 08/16/16   Steele Sizer, MD  furosemide (LASIX) 20 MG tablet Take 1 tablet (20 mg total) by mouth daily as needed. Patient taking differently: Take 20 mg by mouth daily as needed for fluid or edema. 01/16/21   Minna Merritts, MD  GARLIC [id number] PO Take [id number] mg by mouth daily.    [provider]  Glucosamine 500 MG TABS Take 500 mg by mouth daily.    [provider]  HUMALOG KWIKPEN 100 UNIT/ML KiwkPen Inject 4-12 Units into the skin 3 (three) times daily before meals. If needed sliding scale 10/15/17   [provider]  insulin glargine (LANTUS) 100 unit/mL SOPN Inject 22 Units into the skin at  bedtime.    [provider]  Javier Docker Oil 1000 MG CAPS Take 1,000 mg by mouth daily.    [provider]  losartan-hydrochlorothiazide (HYZAAR) 100-25 MG tablet Take 1 tablet by mouth in the morning. 01/16/21   Minna Merritts, MD  Mag Aspart-Potassium Aspart (POTASSIUM & MAGNESIUM ASPARTAT PO) Take 1 tablet by mouth daily.    [provider]  metoprolol succinate (TOPROL-XL) 50 MG 24 hr tablet Take 1 tablet (50 mg total) by mouth 2 (two) times daily. Take with or immediately following a meal. 01/16/21   Gollan, Kathlene November, MD  Multiple Vitamins-Minerals (PRESERVISION AREDS 2) CAPS Take 1 capsule by mouth 2 (two) times daily.    [provider]  nystatin cream (MYCOSTATIN) Apply 1 application topically daily as needed for dry skin (yeast).    [provider]  OVER THE COUNTER MEDICATION Place 1 application into both eyes See admin instructions. Blephadex eyelid foam, apply to eyelids once daily    [provider]  OVER THE COUNTER MEDICATION Take 1 tablet by mouth daily. Vita-Lea Gold otc supplement With out vitamin K (Shaklee products)    [provider]  OVER THE COUNTER MEDICATION Take 1 tablet by mouth See admin instructions. Nutriferon otc supplement take Take 1 tablet on Sun, Tues, Thurs, Sat. Take 1 tablet twice daily on Mon, Wed, and Fri    [provider]  Probiotic Product (PROBIOTIC PO) Take 1 capsule by mouth at bedtime.    [provider]  PROLIA 60 MG/ML SOSY injection Inject 60 mg into the skin every 6 (six) months. 09/02/19   [provider]  Propylene Glycol (SYSTANE BALANCE OP) Place 1 drop into both eyes daily as needed (dry eyes).    [provider]  rosuvastatin (CRESTOR) 10 MG tablet Take 1 tablet (10 mg total) by mouth daily. Patient taking differently: Take 10 mg by mouth at bedtime. 01/16/21   Minna Merritts, MD  Sodium Chloride-Sodium Bicarb (NETI POT SINUS [place] NA) Place 1 Dose  into the nose at bedtime.    [provider]  sodium fluoride (FLUORISHIELD) 1.1 % GEL dental gel Place 1 application onto teeth  at bedtime. 10/28/15   [provider]  tiZANidine (ZANAFLEX) 2 MG tablet TAKE 1 TABLET BY MOUTH EVERY 6 HOURS AS NEEDED FOR MUSCLE SPASMS. Patient taking differently: Take 2 mg by mouth at bedtime. 12/28/20   Steele Sizer, MD  Vitamin D, Ergocalciferol, (DRISDOL) 1.25 MG (50000 UT) CAPS capsule Take 50,000 Units by mouth every Saturday. 11/23/18   [provider]    Physical Exam: Vitals:   01/23/21 0100 01/23/21 0130 01/23/21 0207 01/23/21 0230  BP: (!) 186/90 (!) 181/93 (!) 180/111 (!) 147/77  Pulse: 73 74 (!) 125 70  Resp: (!) 31 (!) 34 (!) 34 (!) 29  Temp:      TempSrc:      SpO2: 94% 93% 90% 91%     Vitals:   01/23/21 0100 01/23/21 0130 01/23/21 0207 01/23/21 0230  BP: (!) 186/90 (!) 181/93 (!) 180/111 (!) 147/77  Pulse: 73 74 (!) 125 70  Resp: (!) 31 (!) 34 (!) 34 (!) 29  Temp:      TempSrc:      SpO2: 94% 93% 90% 91%      Constitutional: Alert and oriented x 3 .  On BiPAP but able to converse  HEENT:      Head: Normocephalic and atraumatic.         Eyes: PERLA, EOMI, Conjunctivae are normal. Sclera is non-icteric.       Mouth/Throat: Not examined is on BiPAP      Neck: Supple with no signs of meningismus. Cardiovascular: Regular rate and rhythm. No murmurs, gallops, or rubs. 2+ symmetrical distal pulses are present . No JVD.  2+ LE edema Respiratory: Respiratory effort increased.bibasilar rales Gastrointestinal: Soft, non tender, and non distended with positive bowel sounds.  Genitourinary: No CVA tenderness. Musculoskeletal: Nontender with normal range of motion in all extremities. No cyanosis, or erythema of extremities. Neurologic:  Face is symmetric. Moving all extremities. No gross focal neurologic deficits . Skin: Skin is warm, dry.  No rash or ulcers Psychiatric: Mood and affect are normal    Labs on  Admission: I have personally reviewed following labs and imaging studies  CBC: Recent Labs  Lab 01/16/21 1213 01/22/21 2321  WBC 6.6 11.3*  HGB 13.1 13.7  HCT 42.3 40.7  MCV 98* 95.8  PLT 242 [id number]   Basic Metabolic Panel: Recent Labs  Lab 01/16/21 1213  NA 140  K 4.3  CL 98  CO2 26  GLUCOSE 120*  BUN 11  CREATININE 0.77  CALCIUM 9.7   GFR: Estimated Creatinine Clearance: 61.5 mL/min (by C-G formula based on SCr of 0.77 mg/dL). Liver Function Tests: No results for input(s): AST, ALT, ALKPHOS, BILITOT, PROT, ALBUMIN in the last 168 hours. No results for input(s): LIPASE, AMYLASE in the last 168 hours. No results for input(s): AMMONIA in the last 168 hours. Coagulation Profile: No results for input(s): INR, PROTIME in the last 168 hours. Cardiac Enzymes: No results for input(s): CKTOTAL, CKMB, CKMBINDEX, TROPONINI in the last 168 hours. BNP (last 3 results) No results for input(s): PROBNP in the last 8760 hours. HbA1C: No results for input(s): HGBA1C in the last 72 hours. CBG: Recent Labs  Lab 01/20/21 0720  GLUCAP 131*   Lipid Profile: No results for input(s): CHOL, HDL, LDLCALC, TRIG, CHOLHDL, LDLDIRECT in the last 72 hours. Thyroid Function Tests: No results for input(s): TSH, T4TOTAL, FREET4, T3FREE, THYROIDAB in the last 72 hours. Anemia Panel: No results for input(s): VITAMINB12, FOLATE, FERRITIN, TIBC, IRON, RETICCTPCT in the last  72 hours. Urine analysis:    Component Value Date/Time   COLORURINE YELLOW (A) 05/13/2020 1159   APPEARANCEUR CLEAR (A) 05/13/2020 1159   APPEARANCEUR Clear 03/03/2020 1133   LABSPEC 1.005 05/13/2020 1159   PHURINE 7.0 05/13/2020 1159   GLUCOSEU NEGATIVE 05/13/2020 1159   HGBUR NEGATIVE 05/13/2020 1159   BILIRUBINUR NEGATIVE 05/13/2020 1159   BILIRUBINUR Negative 03/03/2020 1133   KETONESUR NEGATIVE 05/13/2020 1159   PROTEINUR NEGATIVE 05/13/2020 1159   UROBILINOGEN 0.2 01/16/2016 0955   NITRITE NEGATIVE 05/13/2020 1159    LEUKOCYTESUR NEGATIVE 05/13/2020 1159    Radiological Exams on Admission: DG Chest 2 View  Result Date: 01/23/2021 CLINICAL DATA:  Shortness of breath and hypertension. EXAM: CHEST - 2 VIEW COMPARISON:  February 21, 2015 FINDINGS: The lungs are hyperinflated. Mild diffusely increased lung markings are noted. Mild areas of atelectasis and/or infiltrate are seen within the bilateral lung bases. There is no evidence of a pleural effusion or pneumothorax. The cardiac silhouette is borderline in size. Degenerative changes seen throughout the thoracic spine. IMPRESSION: 1. Mild bibasilar atelectasis and/or infiltrate. 2. A very mild component of superimposed interstitial edema cannot be excluded. Electronically Signed   By: Virgina Norfolk M.D.   On: 01/23/2021 00:05     Assessment/Plan 80 year old female with history of DM, HTN, persistent A. fib status post cardioversion on 7/22 presenting with acute respiratory distress requiring BiPAP.      Hypertensive emergency   Acute respiratory failure with hypoxia ([place]) - Patient presents with acute shortness of breath and increased work of breathing requiring BiPAP -ABG on BiPAP with pH 7.36, PCO2 of 49 and PO2 59 with subsequent improvement - Etiology suspect related to acute heart failure secondary to hypertensive emergency - Continue BiPAP support and wean as tolerated  Hypertensive emergency with acute CHF - BP on arrival with systolic [id number] and diastolic up to 99991111 - BNP [id number] with chest x-ray showing mild component of superimposed interstitial edema/infiltrate -First troponin 10.  Continue to trend - Repeating echo.  Had echo April that showed preserved EF 60 to 65% - Improved with IV Lasix and morphine as well as BiPAP - Patient recently taken off of Toprol due to bradycardia following her cardioversion -Continue blood pressure control, but will continue to avoid beta-blockers    Atrial fibrillation status post cardioversion 01/20/2021 -  Patient continues to be in sinus rhythm - Amiodarone was continued.  Toprol was DC'd for bradycardia on 7/23 - Cardiology consult    Diabetes mellitus, type II (HCC) - Sliding scale insulin coverage    DVT prophylaxis: Lovenox  Code Status: full code  Family Communication: Husband at bedside.  Plan of care explained to patient and husband.  They voiced understanding and are in agreement Disposition Plan: Back to previous home environment Consults called: Cardiology Status:At the time of admission, it appears that the appropriate admission status for this patient is INPATIENT. This is judged to be reasonable and necessary in order to provide the required intensity of service to ensure the patient's safety given the presenting symptoms, physical exam findings, and initial radiographic and laboratory data in the context of their  Comorbid conditions.   Patient requires inpatient status due to high intensity of service, high risk for further deterioration and high frequency of surveillance required.   I certify that at the point of admission it is my clinical judgment that the patient will require inpatient hospital care spanning beyond [place] MD [facility]  01/23/2021, 2:53 AM

## 2021-01-23 NOTE — ED Notes (Signed)
Pt placed on a hospital bed and made comfortable.

## 2021-01-23 NOTE — ED Notes (Signed)
Pt removed from bedpan on her request.

## 2021-01-23 NOTE — Progress Notes (Signed)
Anticoagulation monitoring(Lovenox):  80 yo female ordered Lovenox 40 mg Q24h    There were no vitals filed for this visit. BMI 34.9    Lab Results  Component Value Date   CREATININE 0.77 01/16/2021   CREATININE 0.71 01/09/2021   CREATININE 0.58 05/13/2020   Estimated Creatinine Clearance: 61.5 mL/min (by C-G formula based on SCr of 0.77 mg/dL). Hemoglobin & Hematocrit     Component Value Date/Time   HGB 13.7 01/22/2021 2321   HGB 13.1 01/16/2021 1213   HCT 40.7 01/22/2021 2321   HCT 42.3 01/16/2021 1213     Per Protocol for Patient with estCrcl > 30 ml/min and BMI > 30, will transition to Lovenox 45 mg Q24h.

## 2021-01-23 NOTE — Progress Notes (Signed)
*  PRELIMINARY RESULTS* Echocardiogram 2D Echocardiogram has been performed.  Bonnie Rowland 01/23/2021, 9:49 AM

## 2021-01-23 NOTE — ED Notes (Signed)
Troponin 692. Dr. Leslye Peer notified. Will await new orders.

## 2021-01-23 NOTE — ED Notes (Signed)
Pt states feels improved, pt is less diaphoretic, urine draining into external female urinary catheter.

## 2021-01-23 NOTE — ED Notes (Signed)
Messaged Dr. Leslye Peer in regards to patients low BP. Awaiting any new orders.

## 2021-01-23 NOTE — ED Notes (Signed)
Report from [place], rn.

## 2021-01-23 NOTE — ED Notes (Signed)
Lab  Notified of need for venipuncture assist.

## 2021-01-23 NOTE — Consult Note (Signed)
ANTICOAGULATION CONSULT NOTE - Follow Up Consult  Pharmacy Consult for Heparin gtt Indication: atrial fibrillation  Allergies  Allergen Reactions   Cyclobenzaprine Hypertension   Cheese Other (See Comments)    bloating   Ciprofloxacin Hcl Other (See Comments)    Muscle pain   Coconut Oil     Upset stomach   Keflex [Cephalexin] Other (See Comments)    Patient prefers not to take this medication due to the side effects, caused tendon issues    Patient Measurements:   Heparin Dosing Weight: 72.7 kg  Vital Signs: BP: 125/67 (07/25 1500) Pulse Rate: 107 (07/25 1500)  Labs: Recent Labs    01/22/21 2321 01/23/21 0120 01/23/21 0413  HGB 13.7  --   --   HCT 40.7  --   --   PLT 222  --   --   CREATININE  --   --  0.88  TROPONINIHS 10 87*  --     Estimated Creatinine Clearance: 55.9 mL/min (by C-G formula based on SCr of 0.88 mg/dL).   Medications:  Pt received 0.'5mg'$ /kg dose of lovenox (7/25 0756) and a '5mg'$  dose of eliquis (7/25 1655). 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 Pharmacy consulted for the conversion of Eliquis to Heparin gtt for mgmt of Afib.  Date Time aPTT/HL Rate/Comment 7/25  1655     ---   PM Dose of eliquis '5mg'$      Baseline Labs: aPTT - 31s in 2016; new order pending Hgb - 13.7>__ Plts - 222>__ Trop 10>87  Goal of Therapy:  Heparin level 0.3-0.7 units/ml aPTT 66-102s seconds Monitor platelets by anticoagulation protocol: Yes   Plan:  Pt received dose of Eliquis tonight already (7/25 1655), will order baseline labs. Will start heparin gtt at next dosing interval tomorrow AM and repeat labs at that time.  7/25: Check aPTT/Anti-Xa level now for baseline 7/26 0500: Bolus heparin 3500 units; then start heparin infusion at 1000 units/hr Check aPTT/Anti-Xa level  8 hours after infusion start then at least daily while on heparin until labs correlate.  Titrate by aPTT alone until lab correlation noted, then titrate by anti-xa alone. Continue to monitor H&H and platelets  Lorna Dibble, PharmD, [facility] 01/23/2021,5:21 PM

## 2021-01-23 NOTE — ED Notes (Signed)
Report to brianna, rn.  

## 2021-01-23 NOTE — ED Notes (Signed)
Critical lactic of 3.0 and troponin of 87 called from lab. Dr. Beather Arbour notified face to face, no new verbal orders received.

## 2021-01-23 NOTE — Consult Note (Signed)
Cardiology Consultation:   Patient ID: JIMESHA SCHUT; [id number]; 1940-12-30   Admit date: 01/23/2021 Date of Consult: 01/23/2021  Primary Care Provider: Steele Sizer, MD Primary Cardiologist: Rockey Situ Primary Electrophysiologist:  None   Patient Profile:   Bonnie Rowland is a 80 y.o. female with a hx of normal coronary arteries by LHC in 09/2005, persistent A. Fib status post DCCV on 11/30/2020 with recurrent Afib s/p repeat DCCV on amiodarone 01/20/2021, postoperative atrial flutter in 01/2015, less than 50% left renal artery stenosis by imaging in 2021, 1 to 39% bilateral ICA carotid stenosis by imaging in 2017, DM2, HTN, HLD, and obesity who is being seen today for the evaluation of acute hypoxic respiratory failure at the request of Dr. Damita Dunnings.  History of Present Illness:   Bonnie Rowland was diagnosed with atrial flutter with RVR in 01/2015 after presenting for a total right knee replacement with spontaneous conversion to sinus rhythm.  She had been maintained on flecainide briefly.  More recently, she was seen in 08/2020 for routine 69-monthfollow-up and noted to be in asymptomatic A. fib with titration of metoprolol and addition of Eliquis at that time.  Echo in 09/2020 demonstrated an EF of 60 to 65%, no regional wall motion abnormalities, indeterminate LV diastolic function parameters, normal RV systolic function and ventricular cavity size, normal PASP, mildly dilated left atrium, and mild mitral regurgitation.  She subsequently underwent successful DCCV at 150 J and 200 J on 11/30/2020 with conversion to sinus rhythm.  When she was seen in follow up on 12/22/2020, she was noted to be back in Afib with controlled ventricular response.  In this setting, she was loaded with amiodarone and subsequently underwent repeat successful DCCV on 01/20/2021.  She called the office on 7/23 noting bradycardic heart rates and episode of hypoxia.  At that time, shew as advised to hold Toprol XL. On  7/24, she noted worsening dyspnea with associated orthopnea, and worsening lower extremity swelling. No chest pain or palpitations. She has not missed any doses of her Eliquis.  Due to persistent symptoms, she presented to the ED on the evening of 7/24. Upon her arrival, she was hypertensive with BP 222/85. Initial EKG showed sinus, though review of telemetry is consistent with possible recurrence of Afib/flutter. Labs were notable for HS-Tn 10 with a delta troponin of 87, BNP 237, ABG with pCO2 49 and pO2 of 59. CXR with diffuse bilateral infiltrates/opacities. Blood cultures are no growth to date. She has received IV Lasix in the ED with patient reported vigorous UOP of 4 L. She briefly required BiPAP, though has been weaned to supplemental oxygen via nasal cannula and notes her dyspnea is significantly improved, though not back to baseline and continues to note orthopnea.   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)  Renal artery stenosis (HCC)    Sprain of lumbar region    Thoracic or lumbosacral neuritis or radiculitis, unspecified    Urinary tract infection, site not specified     Past Surgical History:  Procedure Laterality Date   APPENDECTOMY     CARDIAC CATHETERIZATION     CARDIOVERSION N/A 11/30/2020   Procedure: CARDIOVERSION;  Surgeon: Minna Merritts, MD;  Location: [facility] OR[facility];  Service: Cardiovascular;  Laterality: N/A;    CARDIOVERSION N/A 01/20/2021   Procedure: CARDIOVERSION;  Surgeon: Minna Merritts, MD;  Location: [facility];  Service: Cardiovascular;  Laterality: N/A;   cataract surgery     COLONOSCOPY     COLONOSCOPY WITH PROPOFOL N/A 05/09/2016   Procedure: COLONOSCOPY WITH PROPOFOL;  Surgeon: Robert Bellow, MD;  Location: [facility];  Service: Endoscopy;  Laterality: N/A;   DIAGNOSTIC MAMMOGRAM     EYE SURGERY Bilateral    Cataract Extraction   JOINT REPLACEMENT  2016   KNEE ARTHROPLASTY Right 03/30/2015   Procedure: COMPUTER ASSISTED TOTAL KNEE ARTHROPLASTY;  Surgeon: Dereck Leep, MD;  Location: [facility] OR[facility];  Service: Orthopedics;  Laterality: Right;   KNEE ARTHROSCOPY Right    KNEE CLOSED REDUCTION Right 05/23/2015   Procedure: CLOSED MANIPULATION KNEE;  Surgeon: Dereck Leep, MD;  Location: [facility] OR[facility];  Service: Orthopedics;  Laterality: Right;   TONSILLECTOMY       Home Meds: Prior to Admission medications   Medication Sig Start Date End Date Taking? Authorizing Provider  acetaminophen (TYLENOL) 650 MG CR tablet Take 1,300 mg by mouth at bedtime.   Yes [provider]  ALFALFA PO Take 1 tablet by mouth daily.   Yes [provider]  amiodarone (PACERONE) 200 MG tablet Take 1 tablet (200 mg total) by mouth 2 (two) times daily. 01/16/21  Yes Gollan, Kathlene November, MD  apixaban (ELIQUIS) 5 MG TABS tablet Take 1 tablet (5 mg total) by mouth 2 (two) times daily. 09/26/20  Yes Gollan, Kathlene November, MD  ascorbic Acid (VITAMIN C) 500 MG CPCR Take 500 mg by mouth daily.   Yes [provider]  B Complex-C (B-COMPLEX WITH VITAMIN C) tablet Take 1 tablet by mouth 2 (two) times daily.   Yes [provider]  BD PEN NEEDLE NANO U/F 32G X 4 MM MISC USE 4 TIMES DAILY (HUMALOG 3 TIMES DAILY AND LANTUS ONCE DAILY) OFFICE NOTIFIED 08/06/17 12/07/18  Yes Sowles, Drue Stager, MD  BLACK ELDERBERRY,BERRY-FLOWER, PO Take 15 mLs by mouth daily. Liquid   Yes [provider]  Calcium  Carbonate (CALCIUM 600 PO) Take 600 mg by mouth daily.   Yes [provider]  cholecalciferol (VITAMIN D) 1000 UNITS tablet Take 1,000 Units by mouth daily. [place]   Yes [provider]  clobetasol ointment (TEMOVATE) [id number] % APPLY 1 APPLICATION TOPICALLY 2 TIMES DAILY AS NEEDED (BUG BITES). Patient taking differently: Apply 1 application topically daily as needed (Bug bites). 12/06/20  Yes Sowles, Drue Stager, MD  Coenzyme Q10 (COQ10) 200 MG CAPS Take 200 mg by mouth daily.   Yes [provider]  Continuous Blood Gluc Sensor ([place]) MISC by Does not apply route.   Yes [provider]  Elastic Bandages & Supports (MEDICAL COMPRESSION STOCKINGS) [place] 1 each by Does not apply route daily. 01/08/19  Yes Sowles, Drue Stager, MD  estradiol (ESTRACE VAGINAL) 0.1 MG/GM vaginal cream 1/2 gm once weekly using applicator, apply blueberry sized amount of cream using tip of finger to urethra twice weekly Patient  taking differently: Place 1 Applicatorful vaginally See admin instructions. 1/2 gm once weekly using applicator, apply blueberry sized amount of cream using tip of finger to urethra three weekly 06/10/20  Yes McGowan, Larene Beach A, [name]  Exenatide ER (BYDUREON BCISE) 2 MG/0.85ML AUIJ Inject 2 mg into the skin every Sunday. 02/26/20  Yes [provider]  fluconazole (DIFLUCAN) 100 MG tablet Take 100 mg by mouth once as needed.   Yes [provider]  fluticasone (FLONASE) 50 MCG/ACT nasal spray PLACE 2 SPRAYS INTO BOTH NOSTRILS DAILY. Patient taking differently: Place 2 sprays into both nostrils daily as needed for rhinitis. 08/16/16  Yes Sowles, Drue Stager, MD  furosemide (LASIX) 20 MG tablet Take 1 tablet (20 mg total) by mouth daily as needed. Patient taking differently: Take 20 mg by mouth daily as needed for fluid or edema. 01/16/21  Yes Gollan, Kathlene November, MD  GARLIC [id number] PO Take [id number] mg by mouth daily.   Yes [provider]   Glucosamine 500 MG TABS Take 500 mg by mouth daily.   Yes [provider]  HUMALOG KWIKPEN 100 UNIT/ML KiwkPen Inject 4-12 Units into the skin 3 (three) times daily before meals. If needed sliding scale 10/15/17  Yes [provider]  insulin glargine (LANTUS) 100 unit/mL SOPN Inject 22 Units into the skin at bedtime.   Yes [provider]  Javier Docker Oil 1000 MG CAPS Take 1,000 mg by mouth daily.   Yes [provider]  losartan-hydrochlorothiazide (HYZAAR) 100-25 MG tablet Take 1 tablet by mouth in the morning. 01/16/21  Yes Gollan, Kathlene November, MD  Mag Aspart-Potassium Aspart (POTASSIUM & MAGNESIUM ASPARTAT PO) Take 1 tablet by mouth daily.   Yes [provider]  Multiple Vitamins-Minerals (PRESERVISION AREDS 2) CAPS Take 1 capsule by mouth 2 (two) times daily.   Yes [provider]  OVER THE COUNTER MEDICATION Place 1 application into both eyes See admin instructions. Blephadex eyelid foam, apply to eyelids once daily   Yes [provider]  OVER THE COUNTER MEDICATION Take 1 tablet by mouth daily. Vita-Lea Gold otc supplement With out vitamin K ([place] products)   Yes [provider]  OVER THE COUNTER MEDICATION Take 1 tablet by mouth See admin instructions. Nutriferon otc supplement take Take 1 tablet on Sun, Tues, Thurs, Sat. Take 1 tablet twice daily on Mon, Wed, and Fri   Yes [provider]  Probiotic Product (PROBIOTIC PO) Take 1 capsule by mouth at bedtime.   Yes [provider]  PROLIA 60 MG/ML SOSY injection Inject 60 mg into the skin every 6 (six) months. 09/02/19  Yes [provider]  Propylene Glycol (SYSTANE BALANCE OP) Place 1 drop into both eyes daily as needed (dry eyes).   Yes [provider]  rosuvastatin (CRESTOR) 10 MG tablet Take 1 tablet (10 mg total) by mouth daily. Patient taking differently: Take 10 mg by mouth at bedtime. 01/16/21  Yes Gollan, Kathlene November, MD  Sodium  Chloride-Sodium Bicarb (NETI POT SINUS [place] NA) Place 1 Dose into the nose at bedtime.   Yes [provider]  tiZANidine (ZANAFLEX) 2 MG tablet TAKE 1 TABLET BY MOUTH EVERY 6 HOURS AS NEEDED FOR MUSCLE SPASMS. Patient taking differently: Take 2 mg by mouth at bedtime. 12/28/20  Yes Sowles, Drue Stager, MD  Vitamin D, Ergocalciferol, (DRISDOL) 1.25 MG (50000 UT) CAPS capsule Take 50,000 Units by mouth every Saturday. 11/23/18  Yes [provider]  metoprolol succinate (TOPROL-XL) 50 MG 24 hr tablet Take  1 tablet (50 mg total) by mouth 2 (two) times daily. Take with or immediately following a meal. Patient not taking: Reported on 01/23/2021 01/16/21 01/23/21  Minna Merritts, MD    Inpatient Medications: Scheduled Meds:  acidophilus  1 capsule Oral QHS   amiodarone  200 mg Oral BID   apixaban  5 mg Oral BID   furosemide  40 mg Intravenous BID   insulin aspart  0-15 Units Subcutaneous TID WC   insulin aspart  0-5 Units Subcutaneous QHS   insulin glargine  15 Units Subcutaneous QHS   losartan  100 mg Oral Daily   [START ON 01/24/2021] multivitamin-lutein  1 capsule Oral BID   rosuvastatin  10 mg Oral QHS   tiZANidine  2 mg Oral QHS   Continuous Infusions:  PRN Meds: acetaminophen **OR** acetaminophen, fluticasone, ondansetron **OR** ondansetron (ZOFRAN) IV, polyvinyl alcohol  Allergies:   Allergies  Allergen Reactions   Cyclobenzaprine Hypertension   Cheese Other (See Comments)    bloating   Ciprofloxacin Hcl Other (See Comments)    Muscle pain   Coconut Oil     Upset stomach   Keflex [Cephalexin] Other (See Comments)    Patient prefers not to take this medication due to the side effects, caused tendon issues    Social History:   Social History   Socioeconomic History   Marital status: Married    Spouse name: Emergency planning/management officer   Number of children: 1   Years of education: Not on file   Highest education level: Master's degree (e.g., MA, MS, MEng, MEd, MSW, MBA)   Occupational History   Occupation: Retired  Tobacco Use   Smoking status: Never   Smokeless tobacco: Never  Vaping Use   Vaping Use: Never used  Substance and Sexual Activity   Alcohol use: Not Currently    Alcohol/week: 1.0 standard drink    Types: 1 Glasses of wine per week   Drug use: No   Sexual activity: Yes    Partners: Male    Birth control/protection: Post-menopausal  Other Topics Concern   Not on file  Social History Narrative   She is married , only has one son and he was diagnosed with colon cancer at age 21.    Social Determinants of Health   Financial Resource Strain: Low Risk    Difficulty of Paying Living Expenses: Not very hard  Food Insecurity: No Food Insecurity   Worried About Charity fundraiser in the Last Year: Never true   Ran Out of Food in the Last Year: Never true  Transportation Needs: No Transportation Needs   Lack of Transportation (Medical): No   Lack of Transportation (Non-Medical): No  Physical Activity: Sufficiently Active   Days of Exercise per Week: 7 days   Minutes of Exercise per Session: 30 min  Stress: No Stress Concern Present   Feeling of Stress : Not at all  Social Connections: Socially Integrated   Frequency of Communication with Friends and Family: More than three times a week   Frequency of Social Gatherings with Friends and Family: More than three times a week   Attends Religious Services: More than 4 times per year   Active Member of [employer] or Organizations: Yes   Attends Music therapist: More than 4 times per year   Marital Status: Married  Human resources officer Violence: Not At Risk   Fear of Current or Ex-Partner: No   Emotionally Abused: No   Physically Abused: No   Sexually  Abused: No     Family History:   Family History  Problem Relation Age of Onset   Diabetes Mother    Hypertension Mother    Cancer Mother    Kidney disease Mother    Hypertension Father    Cancer Father        Prostate   Breast  cancer Maternal Aunt 27   Colon cancer Son    Kidney cancer Neg Hx    Bladder Cancer Neg Hx     ROS:  Review of Systems  Constitutional:  Positive for malaise/fatigue. Negative for chills, diaphoresis, fever and weight loss.  HENT:  Negative for congestion.   Eyes:  Negative for discharge and redness.  Respiratory:  Positive for cough and shortness of breath. Negative for hemoptysis, sputum production and wheezing.   Cardiovascular:  Positive for orthopnea and leg swelling. Negative for chest pain, palpitations, claudication and PND.  Gastrointestinal:  Negative for abdominal pain, blood in stool, heartburn, melena, nausea and vomiting.  Musculoskeletal:  Negative for falls and myalgias.  Skin:  Negative for rash.  Neurological:  Positive for weakness. Negative for dizziness, tingling, tremors, sensory change, speech change, focal weakness and loss of consciousness.  Endo/Heme/Allergies:  Does not bruise/bleed easily.  Psychiatric/Behavioral:  Negative for substance abuse. The patient is not nervous/anxious.   All other systems reviewed and are negative.    Physical Exam/Data:   Vitals:   01/23/21 1155 01/23/21 1200 01/23/21 1248 01/23/21 1500  BP:  119/64  125/67  Pulse:  (!) 104  (!) 107  Resp:  (!) 30  (!) 22  Temp:      TempSrc:      SpO2: 96% 97% 100% 99%    Intake/Output Summary (Last 24 hours) at 01/23/2021 1656 Last data filed at 01/23/2021 1108 Gross per 24 hour  Intake 145.78 ml  Output 1650 ml  Net -1504.22 ml   There were no vitals filed for this visit. There is no height or weight on file to calculate BMI.   Physical Exam: General: Well developed, well nourished, in no acute distress. Head: Normocephalic, atraumatic, sclera non-icteric, no xanthomas, nares without discharge.  Neck: Negative for carotid bruits. JVD elevated to the angle of the mandible. Lungs: Diminished breath sounds bilaterally with diffuse rales. Breathing is unlabored. Heart: IRIR with S1  S2. No murmurs, rubs, or gallops appreciated. Abdomen: Soft, non-tender, non-distended with normoactive bowel sounds. No hepatomegaly. No rebound/guarding. No obvious abdominal masses. Msk:  Strength and tone appear normal for age. Extremities: No clubbing or cyanosis. 1+ bilateral lower extremity pitting edema. Distal pedal pulses are 2+ and equal bilaterally. Neuro: Alert and oriented X 3. No facial asymmetry. No focal deficit. Moves all extremities spontaneously. Psych:  Responds to questions appropriately with a normal affect.   EKG:  The EKG was personally reviewed and demonstrates: NSR, 71 bpm, baseline artifact, no acute st/t changes Telemetry:  Telemetry was personally reviewed and demonstrates: Afib/flutter  Weights: There were no vitals filed for this visit.  Relevant CV Studies:  2D echo 09/2020: 1. Left ventricular ejection fraction, by estimation, is 60 to 65%. The  left ventricle has normal function. The left ventricle has no regional  wall motion abnormalities. Left ventricular diastolic parameters are  indeterminate.   2. Right ventricular systolic function is normal. The right ventricular  size is normal. There is normal pulmonary artery systolic pressure.   3. Left atrial size was mildly dilated.   4. The mitral valve is normal in structure.  Mild mitral valve  regurgitation. __________   Renal artery ultrasound 08/2019: Summary:  Largest Aortic Diameter: 1.6 cm     Renal:     Right: Normal size right kidney. Normal right Resisitive Index.         Normal cortical thickness of right kidney. No evidence of         right renal artery stenosis. RRV flow present.  Left:  Cyst(s) noted. 1-59% stenosis of the left renal artery.         Normal size of left kidney. Normal left Resistive Index.         Normal cortical thickness of the left kidney. Cyst 1.62 cm x         1.71 cm.  Mesenteric:  Normal Celiac artery and Superior Mesenteric artery findings.  Patent  IVC. __________   Carotid artery ultrasound 08/2015: 1 to 39% bilateral ICA stenosis __________   2D echo 01/2015: - Left ventricle: The cavity size was normal. Systolic function was    normal. The estimated ejection fraction was in the range of 60%    to 65%. Wall motion was normal; there were no regional wall    motion abnormalities. Left ventricular diastolic function    parameters were normal.  - Mitral valve: There was mild regurgitation.  - Left atrium: The atrium was mildly dilated.  - Right ventricle: Systolic function was normal.  - Pulmonary arteries: Systolic pressure was borderline elevated. PA    peak pressure: 37 mm Hg (S).   Impressions:   - Normal study. Rhythm is normal sinus.  Laboratory Data:  Chemistry Recent Labs  Lab 01/23/21 0413  NA 134*  K 4.3  CL 96*  CO2 28  GLUCOSE 284*  BUN 21  CREATININE 0.88  CALCIUM 9.0  GFRNONAA >60  ANIONGAP 10    Recent Labs  Lab 01/23/21 0413  PROT 7.1  ALBUMIN 4.0  AST 42*  ALT 34  ALKPHOS 81  BILITOT 1.6*   Hematology Recent Labs  Lab 01/22/21 2321  WBC 11.3*  RBC 4.25  HGB 13.7  HCT 40.7  MCV 95.8  MCH 32.2  MCHC 33.7  RDW 13.2  PLT 222   Cardiac EnzymesNo results for input(s): TROPONINI in the last 168 hours. No results for input(s): TROPIPOC in the last 168 hours.  BNP Recent Labs  Lab 01/22/21 2321  BNP 237.3*    DDimer No results for input(s): DDIMER in the last 168 hours.  Radiology/Studies:  DG Chest 2 View  Result Date: 01/23/2021 IMPRESSION: 1. Mild bibasilar atelectasis and/or infiltrate. 2. A very mild component of superimposed interstitial edema cannot be excluded. Electronically Signed   By: Virgina Norfolk M.D.   On: 01/23/2021 00:05   DG Chest Port 1 View  Result Date: 01/23/2021 IMPRESSION: Moderate to marked severity bilateral infiltrates, increased in severity when compared to the prior study. Electronically Signed   By: Virgina Norfolk M.D.   On: 01/23/2021 03:17     Assessment and Plan:   1. Acute hypoxic respiratory failure: -Improving -Continue IV Lasix 40 mg bid with KCl repletion as indicated -Cannot exclude some degree of flash pulmonary edema  -Appears to be less consistent with PNA at this time -Cannot exclude possible early case of amiodarone toxicity, though for now, would continue amiodarone with plans to repeat CXR following diuresis and if symptoms/imaging are unchanged at that time, may need to consider stopping amiodarone  -Wean supplemental oxygen as able  2. Hypertensive urgency with RAS: -  Prior renal artery ultrasound in 08/2019 showed < 50% left RAS -BP much improved -Continue current therapy   3. Acute on chronic HFpEF: -IV Lasix as above -Strict I/O -Daily standing scale weights  4. Persistent Afib/flutter: -Status post recent briefly successful DCCV in 11/2020 with repeat briefly successful DCCV on amiodarone 01/20/2021 -Appears to be in Afib/flutter now on telemetry, will obtain a 12-lead to confirm with further recommendations pending  -Restart Eliquis, nurse notified to give dose once ordered so patient will not miss a full dose -Continue amiodarone    For questions or updates, please contact [place] Please consult [email or web address] for contact info under Cardiology/STEMI.   Signed, [name], [name] [place] Pager: [phone] 01/23/2021, 4:56 PM

## 2021-01-23 NOTE — ED Notes (Signed)
Pt asking for something to drink. Pt taken off bipap, placed on 4L [place] to provide ice water for a few mins. SpO2 92%. Pt speaking in short sentences, requesting to have bipap mask placed again.  bipap placed on pt.  purewick in place. Stretcher locked in lowest position.  Call bell in place. Family at bedside

## 2021-01-23 NOTE — ED Notes (Signed)
MD notified of change in VS

## 2021-01-23 NOTE — ED Provider Notes (Signed)
[facility] Emergency Department Provider Note   ____________________________________________   Event Date/Time   First MD Initiated Contact with Patient 01/23/21 0011     (approximate)  I have reviewed the triage vital signs and the nursing notes.   HISTORY  Chief Complaint Shortness of Breath and Hypertension    HPI Bonnie Rowland is a 80 y.o. female who presents to the ED from home with a chief complaint of shortness of breath and hypertension.  Patient with a history of atrial fibrillation on Eliquis who had cardioversion by Dr. Rockey Situ on 01/20/2021.  Both patient and her husband have noticed increase in BLE swelling.  States she takes Lasix 20 mg daily as needed.  Reports increasing shortness of breath over the weekend associated with chest tightness.  Denies fevers, cough, abdominal pain, nausea, vomiting or dizziness.     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)    Renal artery stenosis (HCC)    Sprain of lumbar region    Thoracic or lumbosacral neuritis or radiculitis, unspecified    Urinary tract infection, site not specified     Patient Active Problem List   Diagnosis Date Noted   Atrial fibrillation status post cardioversion 01/20/2021(HCC) 01/23/2021   Diabetes mellitus, type II ([place]) 01/23/2021   Hypertensive emergency 01/23/2021   Acute respiratory failure with hypoxia ([place]) 01/23/2021    Clavi 04/07/2020   Acute left-sided low back pain without sciatica 11/25/2019   Trochanteric bursitis of left hip 11/25/2019   Plantar fasciitis, bilateral 05/21/2019   Age-related osteoporosis without current pathological fracture 11/05/2017   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([place]) 07/09/2017   Atherosclerosis of abdominal aorta ([place]) 07/09/2017   Bilateral carotid bruits 08/24/2015   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   Atrial flutter ([place]) 02/22/2015   Chronic pain 01/10/2015   Type 2 diabetes, uncontrolled, 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 ([place]) 01/10/2015   Fatigue 01/10/2015   History of melanoma excision 01/10/2015   Morbid obesity ([place]) 01/10/2015   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   Spinal stenosis, lumbar region, 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   DDD (degenerative disc disease), lumbar 11/14/2014   Greater trochanteric bursitis of both hips 11/14/2014   Piriformis syndrome 11/14/2014   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   Hyperlipemia 11/07/2009   Hypertension, benign 11/07/2009   Atherosclerosis of renal artery ([place]) 11/07/2009   Hyperlipidemia due to type 2 diabetes mellitus ([place]) 11/07/2009   Perennial allergic rhinitis 11/27/2007   Lumbosacral neuritis 01/17/2007    Past Surgical History:  Procedure Laterality Date   APPENDECTOMY     CARDIAC CATHETERIZATION     CARDIOVERSION N/A 11/30/2020   Procedure: CARDIOVERSION;  Surgeon: Minna Merritts, MD;  Location: [facility] ORS;  Service: Cardiovascular;  Laterality: N/A;   CARDIOVERSION N/A 01/20/2021  Procedure: CARDIOVERSION;  Surgeon: Minna Merritts, MD;  Location: [facility] ORS;  Service: Cardiovascular;  Laterality: N/A;   cataract surgery     COLONOSCOPY     COLONOSCOPY WITH PROPOFOL N/A 05/09/2016   Procedure: COLONOSCOPY WITH PROPOFOL;  Surgeon: Robert Bellow, MD;  Location: [facility] ENDOSCOPY;  Service: Endoscopy;  Laterality: N/A;   DIAGNOSTIC MAMMOGRAM     EYE  SURGERY Bilateral    Cataract Extraction   JOINT REPLACEMENT  2016   KNEE ARTHROPLASTY Right 03/30/2015   Procedure: COMPUTER ASSISTED TOTAL KNEE ARTHROPLASTY;  Surgeon: Dereck Leep, MD;  Location: [facility] ORS;  Service: Orthopedics;  Laterality: Right;   KNEE ARTHROSCOPY Right    KNEE CLOSED REDUCTION Right 05/23/2015   Procedure: CLOSED MANIPULATION KNEE;  Surgeon: Dereck Leep, MD;  Location: [facility] ORS;  Service: Orthopedics;  Laterality: Right;   TONSILLECTOMY      Prior to Admission medications   Medication Sig Start Date End Date Taking? Authorizing Provider  acetaminophen (TYLENOL) 650 MG CR tablet Take 1,300 mg by mouth at bedtime.    [provider]  ALFALFA PO Take 1 tablet by mouth daily.    [provider]  amiodarone (PACERONE) 200 MG tablet Take 1 tablet (200 mg total) by mouth 2 (two) times daily. 01/16/21   Minna Merritts, MD  apixaban (ELIQUIS) 5 MG TABS tablet Take 1 tablet (5 mg total) by mouth 2 (two) times daily. 09/26/20   Minna Merritts, MD  ascorbic Acid (VITAMIN C) 500 MG CPCR Take 500 mg by mouth daily.    [provider]  B Complex-C (B-COMPLEX WITH VITAMIN C) tablet Take 1 tablet by mouth 2 (two) times daily.    [provider]  BD PEN NEEDLE NANO U/F 32G X 4 MM MISC USE 4 TIMES DAILY (HUMALOG 3 TIMES DAILY AND LANTUS ONCE DAILY) OFFICE NOTIFIED 08/06/17 12/07/18   Steele Sizer, MD  BLACK ELDERBERRY,BERRY-FLOWER, PO Take 15 mLs by mouth daily. Liquid    [provider]  Calcium Carbonate (CALCIUM 600 PO) Take 600 mg by mouth daily.    [provider]  cholecalciferol (VITAMIN D) 1000 UNITS tablet Take 1,000 Units by mouth daily. [place]    [provider]  clobetasol ointment (TEMOVATE) [id number] % APPLY 1 APPLICATION TOPICALLY 2 TIMES DAILY AS NEEDED (BUG BITES). Patient taking differently: Apply 1 application topically daily as needed (Bug bites). 12/06/20   Steele Sizer, MD  Coenzyme Q10 (COQ10) 200  MG CAPS Take 200 mg by mouth daily.    [provider]  Continuous Blood Gluc Sensor (Carlos) MISC by Does not apply route.    [provider]  Elastic Bandages & Supports (MEDICAL COMPRESSION STOCKINGS) [place] 1 each by Does not apply route daily. 01/08/19   Steele Sizer, MD  estradiol (ESTRACE VAGINAL) 0.1 MG/GM vaginal cream 1/2 gm once weekly using applicator, apply blueberry sized amount of cream using tip of finger to urethra twice weekly Patient taking differently: Place 1 Applicatorful vaginally See admin instructions. 1/2 gm once weekly using applicator, apply blueberry sized amount of cream using tip of finger to urethra three weekly 06/10/20   Zara Council A, [name]  Exenatide ER (BYDUREON BCISE) 2 MG/0.85ML AUIJ Inject 2 mg into the skin every Sunday. 02/26/20   [provider]  fluticasone (FLONASE) 50 MCG/ACT nasal spray PLACE 2 SPRAYS INTO BOTH NOSTRILS DAILY. Patient taking differently: Place 2 sprays into both nostrils daily as needed for rhinitis. 08/16/16  Steele Sizer, MD  furosemide (LASIX) 20 MG tablet Take 1 tablet (20 mg total) by mouth daily as needed. Patient taking differently: Take 20 mg by mouth daily as needed for fluid or edema. 01/16/21   Minna Merritts, MD  GARLIC [id number] PO Take [id number] mg by mouth daily.    [provider]  Glucosamine 500 MG TABS Take 500 mg by mouth daily.    [provider]  HUMALOG KWIKPEN 100 UNIT/ML KiwkPen Inject 4-12 Units into the skin 3 (three) times daily before meals. If needed sliding scale 10/15/17   [provider]  insulin glargine (LANTUS) 100 unit/mL SOPN Inject 22 Units into the skin at bedtime.    [provider]  Javier Docker Oil 1000 MG CAPS Take 1,000 mg by mouth daily.    [provider]  losartan-hydrochlorothiazide (HYZAAR) 100-25 MG tablet Take 1 tablet by mouth in the morning. 01/16/21   Minna Merritts, MD  Mag Aspart-Potassium Aspart  (POTASSIUM & MAGNESIUM ASPARTAT PO) Take 1 tablet by mouth daily.    [provider]  metoprolol succinate (TOPROL-XL) 50 MG 24 hr tablet Take 1 tablet (50 mg total) by mouth 2 (two) times daily. Take with or immediately following a meal. 01/16/21   Gollan, Kathlene November, MD  Multiple Vitamins-Minerals (PRESERVISION AREDS 2) CAPS Take 1 capsule by mouth 2 (two) times daily.    [provider]  nystatin cream (MYCOSTATIN) Apply 1 application topically daily as needed for dry skin (yeast).    [provider]  OVER THE COUNTER MEDICATION Place 1 application into both eyes See admin instructions. Blephadex eyelid foam, apply to eyelids once daily    [provider]  OVER THE COUNTER MEDICATION Take 1 tablet by mouth daily. Vita-Lea Gold otc supplement With out vitamin K (Shaklee products)    [provider]  OVER THE COUNTER MEDICATION Take 1 tablet by mouth See admin instructions. Nutriferon otc supplement take Take 1 tablet on Sun, Tues, Thurs, Sat. Take 1 tablet twice daily on Mon, Wed, and Fri    [provider]  Probiotic Product (PROBIOTIC PO) Take 1 capsule by mouth at bedtime.    [provider]  PROLIA 60 MG/ML SOSY injection Inject 60 mg into the skin every 6 (six) months. 09/02/19   [provider]  Propylene Glycol (SYSTANE BALANCE OP) Place 1 drop into both eyes daily as needed (dry eyes).    [provider]  rosuvastatin (CRESTOR) 10 MG tablet Take 1 tablet (10 mg total) by mouth daily. Patient taking differently: Take 10 mg by mouth at bedtime. 01/16/21   Minna Merritts, MD  Sodium Chloride-Sodium Bicarb (NETI POT SINUS [place] NA) Place 1 Dose into the nose at bedtime.    [provider]  sodium fluoride (FLUORISHIELD) 1.1 % GEL dental gel Place 1 application onto teeth at bedtime. 10/28/15   [provider]  tiZANidine (ZANAFLEX) 2 MG tablet TAKE 1 TABLET BY MOUTH EVERY 6 HOURS AS NEEDED FOR MUSCLE  SPASMS. Patient taking differently: Take 2 mg by mouth at bedtime. 12/28/20   Steele Sizer, MD  Vitamin D, Ergocalciferol, (DRISDOL) 1.25 MG (50000 UT) CAPS capsule Take 50,000 Units by mouth every Saturday. 11/23/18   [provider]    Allergies Cyclobenzaprine, Cheese, Ciprofloxacin hcl, Coconut oil, and Keflex [cephalexin]  Family History  Problem Relation Age of Onset   Diabetes Mother    Hypertension Mother    Cancer Mother    Kidney disease  Mother    Hypertension Father    Cancer Father        Prostate   Breast cancer Maternal Aunt 62   Colon cancer Son    Kidney cancer Neg Hx    Bladder Cancer Neg Hx     Social History Social History   Tobacco Use   Smoking status: Never   Smokeless tobacco: Never  Vaping Use   Vaping Use: Never used  Substance Use Topics   Alcohol use: Not Currently    Alcohol/week: 1.0 standard drink    Types: 1 Glasses of wine per week   Drug use: No    Review of Systems  Constitutional: No fever/chills Eyes: No visual changes. ENT: No sore throat. Cardiovascular: Positive for chest pain. Respiratory: Positive for shortness of breath. Gastrointestinal: No abdominal pain.  No nausea, no vomiting.  No diarrhea.  No constipation. Genitourinary: Negative for dysuria. Musculoskeletal: Negative for back pain. Skin: Negative for rash. Neurological: Negative for headaches, focal weakness or numbness.   ____________________________________________   PHYSICAL EXAM:  VITAL SIGNS: ED Triage Vitals  Enc Vitals Group     BP 01/22/21 2314 (!) 222/85     Pulse Rate 01/22/21 2314 64     Resp 01/22/21 2314 (!) 26     Temp 01/22/21 2314 98.3 F (36.8 C)     Temp Source 01/22/21 2314 Oral     SpO2 01/22/21 2314 90 %     Weight --      Height --      Head Circumference --      Peak Flow --      Pain Score 01/23/21 0013 0     Pain Loc --      Pain Edu? --      Excl. in [place]? --     Constitutional: Alert and oriented.  Elderly  appearing and in moderate acute distress. Eyes: Conjunctivae are normal. PERRL. EOMI. Head: Atraumatic. Nose: No congestion/rhinnorhea. Mouth/Throat: Mucous membranes are moist.   Neck: No stridor.   Cardiovascular: Normal rate, regular rhythm. Grossly normal heart sounds.  Good peripheral circulation. Respiratory: Increased respiratory effort.  No retractions. Lungs with bibasilar rales. Gastrointestinal: Soft and nontender. No distention. No abdominal bruits. No CVA tenderness. Musculoskeletal: No lower extremity tenderness.  2+ BLE nonpitting edema.  No joint effusions. Neurologic:  Normal speech and language. No gross focal neurologic deficits are appreciated.  Skin:  Skin is warm, dry and intact. No rash noted. Psychiatric: Mood and affect are normal. Speech and behavior are normal.  ____________________________________________   LABS (all labs ordered are listed, but only abnormal results are displayed)  Labs Reviewed  CBC - Abnormal; Notable for the following components:      Result Value   WBC 11.3 (*)    All other components within normal limits  BRAIN NATRIURETIC PEPTIDE - Abnormal; Notable for the following components:   B Natriuretic Peptide 237.3 (*)    All other components within normal limits  BLOOD GAS, ARTERIAL - Abnormal; Notable for the following components:   pCO2 arterial 49 (*)    pO2, Arterial 59 (*)    All other components within normal limits  BLOOD GAS, ARTERIAL - Abnormal; Notable for the following components:   pO2, Arterial 119 (*)    All other components within normal limits  LACTIC ACID, PLASMA - Abnormal; Notable for the following components:   Lactic Acid, Venous 3.0 (*)    All other components within normal limits  COMPREHENSIVE METABOLIC  PANEL - Abnormal; Notable for the following components:   Sodium 134 (*)    Chloride 96 (*)    Glucose, Bld 284 (*)    AST 42 (*)    Total Bilirubin 1.6 (*)    All other components within normal limits   TROPONIN I (HIGH SENSITIVITY) - Abnormal; Notable for the following components:   Troponin I (High Sensitivity) 87 (*)    All other components within normal limits  SARS CORONAVIRUS 2 (TAT 6-24 HRS)  CULTURE, BLOOD (ROUTINE X 2)  CULTURE, BLOOD (ROUTINE X 2)  PROCALCITONIN  LACTIC ACID, PLASMA  TROPONIN I (HIGH SENSITIVITY)   ____________________________________________  EKG  ED ECG REPORT I, Tramel Westbrook J, the attending physician, personally viewed and interpreted this ECG.   Date: 01/23/2021  EKG Time: 0011  Rate: 71  Rhythm: normal sinus rhythm  Axis: Normal  Intervals:none  ST&T Change: Nonspecific  ED ECG REPORT I, Gwendola Hornaday J, the attending physician, personally viewed and interpreted this ECG.   Date: 01/23/2021  EKG Time: 0210  Rate: 120  Rhythm: sinus tachycardia  Axis: LAD  Intervals:none  ST&T Change: Nonspecific   ____________________________________________  RADIOLOGY Cecilie Kicks J, personally viewed and evaluated these images (plain radiographs) as part of my medical decision making, as well as reviewing the written report by the radiologist.  ED MD interpretation: Interstitial edema; repeat chest x-ray demonstrates increased bilateral infiltrates  Official radiology report(s): DG Chest 2 View  Result Date: 01/23/2021 CLINICAL DATA:  Shortness of breath and hypertension. EXAM: CHEST - 2 VIEW COMPARISON:  February 21, 2015 FINDINGS: The lungs are hyperinflated. Mild diffusely increased lung markings are noted. Mild areas of atelectasis and/or infiltrate are seen within the bilateral lung bases. There is no evidence of a pleural effusion or pneumothorax. The cardiac silhouette is borderline in size. Degenerative changes seen throughout the thoracic spine. IMPRESSION: 1. Mild bibasilar atelectasis and/or infiltrate. 2. A very mild component of superimposed interstitial edema cannot be excluded. Electronically Signed   By: Virgina Norfolk M.D.   On:  01/23/2021 00:05   DG Chest Port 1 View  Result Date: 01/23/2021 CLINICAL DATA:  Respiratory distress. EXAM: PORTABLE CHEST 1 VIEW COMPARISON:  January 22, 2021 FINDINGS: Moderate to marked severity infiltrates are seen within the bilateral lung bases and bilateral upper lobes, right greater than left. This is increased in severity when compared to the prior study. There is no evidence of a pleural effusion or pneumothorax. The cardiac silhouette is borderline in size. Degenerative changes are seen throughout the thoracic spine. IMPRESSION: Moderate to marked severity bilateral infiltrates, increased in severity when compared to the prior study. Electronically Signed   By: Virgina Norfolk M.D.   On: 01/23/2021 03:17    ____________________________________________   PROCEDURES  Procedure(s) performed (including Critical Care):  .1-3 Lead EKG Interpretation  Date/Time: 01/23/2021 12:23 AM Performed by: Paulette Blanch, MD Authorized by: Paulette Blanch, MD     Interpretation: normal     ECG rate:  74   ECG rate assessment: normal     Rhythm: sinus rhythm     Ectopy: none     Conduction: normal   Comments:     Patient placed on cardiac monitor to evaluate for arrhythmias   CRITICAL CARE Performed by: Paulette Blanch   Total critical care time: 60 minutes  Critical care time was exclusive of separately billable procedures and treating other patients.  Critical care was necessary to treat or prevent imminent or life-threatening deterioration.  Critical care was time spent personally by me on the following activities: development of treatment plan with patient and/or surrogate as well as nursing, discussions with consultants, evaluation of patient's response to treatment, examination of patient, obtaining history from patient or surrogate, ordering and performing treatments and interventions, ordering and review of laboratory studies, ordering and review of radiographic studies, pulse oximetry  and re-evaluation of patient's condition.  ____________________________________________   INITIAL IMPRESSION / ASSESSMENT AND PLAN / ED COURSE  As part of my medical decision making, I reviewed the following data within the [place] History obtained from family, Nursing notes reviewed and incorporated, Labs reviewed, EKG interpreted, Old chart reviewed, Radiograph reviewed, Discussed with admitting physician, and Notes from prior ED visits     80 year old female who is several days status post cardioversion presenting with shortness of breath. Differential includes, but is not limited to, viral syndrome, bronchitis including COPD exacerbation, pneumonia, reactive airway disease including asthma, CHF including exacerbation with or without pulmonary/interstitial edema, pneumothorax, ACS, thoracic trauma, and pulmonary embolism.   Initially on nasal cannula oxygen and triage with hypoxia to 77%, placed on nonrebreather and placed immediately in treatment room.  Patient is hypertensive and respiratory distress.  Will place on BiPAP, administer IV Lasix.  Anticipate hospitalization.  Clinical Course as of 01/23/21 0541  Mon Jan 23, 2021  0116 Patient looking more comfortable on BiPAP; blood pressure improved.  Awaiting lab results. [JS]  [id number] Although patient states she is feeling better, she is now tachycardic and tachypneic, saturations 90% on BiPAP.  Urine output 200 cc.  Patient takes Pacerone; would like to give small dose of enalapril but this may interact with the Pacerone.  Will administer low-dose morphine.  Will repeat ABG and cxr. Repeat temp. [JS]  [id number] Now HR 69, BP 147/77, sats 92%.  [JS]  [id number] Heart rate 67, BP stable, saturations 100%.  Morphine helped tremendously.  Noted repeat x-ray; will cover empirically with antibiotics. [JS]  [id number] Noted elevated lactic acid.  Hold IV fluids given patient's volume overload requiring BiPAP. [JS]    Clinical Course User  Index [JS] Paulette Blanch, MD     ____________________________________________   FINAL CLINICAL IMPRESSION(S) / ED DIAGNOSES  Final diagnoses:  Hypertension, unspecified type  Shortness of breath  Hypoxia  Acute pulmonary edema [facility])     ED Discharge Orders     None        Note:  This document was prepared using Dragon voice recognition software and may include unintentional dictation errors.    Paulette Blanch, MD 01/23/21 [phone]

## 2021-01-23 NOTE — ED Notes (Signed)
Pt placed on 6L HFNC by RT. SpO2 99-100%. Pt reports feeling labored. Dr Earleen Newport notified.

## 2021-01-23 NOTE — ED Notes (Signed)
Breakfast tray at bedside, pt declines to eat at this time Family provided juice and crackers

## 2021-01-23 NOTE — Progress Notes (Signed)
Patient ID: Bonnie Rowland, female   DOB: September 24, 1940, 80 y.o.   MRN: [id number] [facility] PROGRESS NOTE  SENECA MOBBS [id number] DOB: 03-14-1941 DOA: 01/23/2021 PCP: Steele Sizer, MD  HPI/Subjective: This morning patient was on BiPAP 30% FiO2.  She stated that she was struggling to breathe. Still feels short of breath but was lying flat.  Nursing staff was able to get her over to high flow nasal cannula.  Recently cardioverted.  Objective: Vitals:   01/23/21 1200 01/23/21 1248  BP: 119/64   Pulse: (!) 104   Resp: (!) 30   Temp:    SpO2: 97% 100%    Intake/Output Summary (Last 24 hours) at 01/23/2021 1520 Last data filed at 01/23/2021 1108 Gross per 24 hour  Intake 145.78 ml  Output 1650 ml  Net -1504.22 ml   There were no vitals filed for this visit.  ROS: Review of Systems  Respiratory:  Positive for cough and shortness of breath.   Cardiovascular:  Negative for chest pain.  Gastrointestinal:  Negative for abdominal pain, nausea and vomiting.  Exam: Physical Exam HENT:     Head: Normocephalic.     Mouth/Throat:     Pharynx: No oropharyngeal exudate.  Eyes:     General: Lids are normal.     Conjunctiva/sclera: Conjunctivae normal.     Pupils: Pupils are equal, round, and reactive to light.  Cardiovascular:     Rate and Rhythm: Normal rate. Rhythm irregularly irregular.     Heart sounds: Normal heart sounds, S1 normal and S2 normal.  Pulmonary:     Breath sounds: Examination of the right-lower field reveals decreased breath sounds. Examination of the left-lower field reveals decreased breath sounds. Decreased breath sounds present. No wheezing, rhonchi or rales.  Abdominal:     Palpations: Abdomen is soft.     Tenderness: There is no abdominal tenderness.  Musculoskeletal:     Right lower leg: Swelling present.     Left lower leg: Swelling present.  Skin:    General: Skin is warm.     Findings: No rash.  Neurological:     Mental Status: She  is alert and oriented to person, place, and time.     Data Reviewed: Basic Metabolic Panel: Recent Labs  Lab 01/23/21 0413  NA 134*  K 4.3  CL 96*  CO2 28  GLUCOSE 284*  BUN 21  CREATININE 0.88  CALCIUM 9.0   Liver Function Tests: Recent Labs  Lab 01/23/21 0413  AST 42*  ALT 34  ALKPHOS 81  BILITOT 1.6*  PROT 7.1  ALBUMIN 4.0   CBC: Recent Labs  Lab 01/22/21 2321  WBC 11.3*  HGB 13.7  HCT 40.7  MCV 95.8  PLT 222    BNP (last 3 results) Recent Labs    01/22/21 2321  BNP 237.3*     CBG: Recent Labs  Lab 01/20/21 0720 01/23/21 0752 01/23/21 1105  GLUCAP 131* 272* 198*    Recent Results (from the past 240 hour(s))  SARS CORONAVIRUS 2 (TAT 6-24 HRS) Nasopharyngeal Nasopharyngeal Swab     Status: None   Collection Time: 01/23/21 12:43 AM   Specimen: Nasopharyngeal Swab  Result Value Ref Range Status   SARS Coronavirus 2 NEGATIVE NEGATIVE Final    Comment: (NOTE) SARS-CoV-2 target nucleic acids are NOT DETECTED.  The SARS-CoV-2 RNA is generally detectable in upper and lower respiratory specimens during the acute phase of infection. Negative results do not preclude SARS-CoV-2 infection, do not rule out  co-infections with other pathogens, and should not be used as the sole basis for treatment or other patient management decisions. Negative results must be combined with clinical observations, patient history, and epidemiological information. The expected result is Negative.  Fact Sheet for Patients: SugarRoll.be  Fact Sheet for Healthcare Providers: https://www.woods-mathews.com/  This test is not yet approved or cleared by the [place] FDA and  has been authorized for detection and/or diagnosis of SARS-CoV-2 by FDA under an Emergency Use Authorization (EUA). This EUA will remain  in effect (meaning this test can be used) for the duration of the COVID-19 declaration under Se ction 564(b)(1) of the  Act, 21 U.S.C. section 360bbb-3(b)(1), unless the authorization is terminated or revoked sooner.  Performed at [facility], [place] [address] [place], [place] [address]   Culture, blood (routine x 2)     Status: None (Preliminary result)   Collection Time: 01/23/21  4:02 AM   Specimen: BLOOD  Result Value Ref Range Status   Specimen Description BLOOD LEFT AC  Final   Special Requests   Final    BOTTLES DRAWN AEROBIC AND ANAEROBIC Blood Culture adequate volume   Culture   Final    NO GROWTH < 12 HOURS Performed at [facility], [address] [place], [place] [address]    Report Status PENDING  Incomplete  Culture, blood (routine x 2)     Status: None (Preliminary result)   Collection Time: 01/23/21  4:13 AM   Specimen: BLOOD  Result Value Ref Range Status   Specimen Description BLOOD LEFT HAND  Final   Special Requests   Final    BOTTLES DRAWN AEROBIC AND ANAEROBIC Blood Culture adequate volume   Culture   Final    NO GROWTH < 12 HOURS Performed at [facility], [address] [place], [place] [address]    Report Status PENDING  Incomplete     Studies: DG Chest 2 View  Result Date: 01/23/2021 CLINICAL DATA:  Shortness of breath and hypertension. EXAM: CHEST - 2 VIEW COMPARISON:  February 21, 2015 FINDINGS: The lungs are hyperinflated. Mild diffusely increased lung markings are noted. Mild areas of atelectasis and/or infiltrate are seen within the bilateral lung bases. There is no evidence of a pleural effusion or pneumothorax. The cardiac silhouette is borderline in size. Degenerative changes seen throughout the thoracic spine. IMPRESSION: 1. Mild bibasilar atelectasis and/or infiltrate. 2. A very mild component of superimposed interstitial edema cannot be excluded. Electronically Signed   By: Virgina Norfolk M.D.   On: 01/23/2021 00:05   DG Chest Port 1 View  Result Date: 01/23/2021 CLINICAL DATA:  Respiratory distress. EXAM: PORTABLE CHEST 1 VIEW  COMPARISON:  January 22, 2021 FINDINGS: Moderate to marked severity infiltrates are seen within the bilateral lung bases and bilateral upper lobes, right greater than left. This is increased in severity when compared to the prior study. There is no evidence of a pleural effusion or pneumothorax. The cardiac silhouette is borderline in size. Degenerative changes are seen throughout the thoracic spine. IMPRESSION: Moderate to marked severity bilateral infiltrates, increased in severity when compared to the prior study. Electronically Signed   By: Virgina Norfolk M.D.   On: 01/23/2021 03:17    Scheduled Meds:  amiodarone  200 mg Oral BID   apixaban  5 mg Oral BID   furosemide  40 mg Intravenous BID   insulin aspart  0-15 Units Subcutaneous TID WC   insulin aspart  0-5 Units Subcutaneous  QHS   insulin glargine  15 Units Subcutaneous QHS   PreserVision AREDS 2  1 capsule Oral BID   rosuvastatin  10 mg Oral QHS   tiZANidine  2 mg Oral QHS     Assessment/Plan:  Acute hypoxic respiratory failure requiring BiPAP in order to oxygenate.  Was still on BiPAP this morning.  Nursing staff able to get over to high flow nasal cannula.  With procalcitonin negative antibiotics discontinued. Hypertensive urgency blood pressure better now that she is breathing a little bit better. Acute on chronic diastolic congestive heart failure on IV Lasix twice daily Paroxysmal atrial fibrillation.  Recent cardioversion.  Restart Eliquis and amiodarone. Type 2 diabetes mellitus with hyperlipidemia on Crestor.  Add glargine insulin at night and sliding scale insulin. Weakness.  Physical therapy evaluation Lactic acidosis     Code Status:     Code Status Orders  (From admission, onward)           Start     Ordered   01/23/21 0314  Full code  Continuous        01/23/21 0315           Code Status History     Date Active Date Inactive Code Status Order ID Comments User Context   04/16/2018 1231 05/13/2020  1123 DNR [id number]  Fredderick Severance, NP Outpatient   05/23/2015 1803 05/24/2015 1055 Full Code [id number]  Dereck Leep, MD Inpatient   03/30/2015 2024 04/02/2015 1759 Full Code [id number]  Dereck Leep, MD Inpatient      Family Communication: Husband at bedside Disposition Plan: Status is: Inpatient  Dispo: The patient is from: Home              Anticipated d/c is to: Home              Patient currently off of BiPAP and on high flow nasal cannula   Difficult to place patient.  No.  Consultants: Cardiology  Time spent: 25 minutes  [place]

## 2021-01-23 NOTE — ED Notes (Signed)
Pt place on bedpan.

## 2021-01-23 NOTE — Progress Notes (Addendum)
Inpatient Diabetes Program Recommendations  AACE/ADA: New Consensus Statement on Inpatient Glycemic Control (2015)  Target Ranges:  Prepandial:   less than 140 mg/dL      Peak postprandial:   less than 180 mg/dL (1-2 hours)      Critically ill patients:  140 - 180 mg/dL   Lab Results  Component Value Date   GLUCAP 198 (H) 01/23/2021   HGBA1C 6.5 (H) 01/09/2021    Review of Glycemic Control Results for [facility], Ioanna P "[name]" (MRN [id number]) as of 01/23/2021 14:43  Ref. Range 01/23/2021 07:52 01/23/2021 11:05  Glucose-Capillary Latest Ref Range: 70 - 99 mg/dL 272 (H) 198 (H)   Diabetes history: DM 2 Outpatient Diabetes medications:  Bydureon 2 mg weekly Humalog 4-12 units tid with meals Lantus 22 units q HS Current orders for Inpatient glycemic control:  Novolog moderate tid with meals and HS  Inpatient Diabetes Program Recommendations:    Please consider adding Lantus 15 units daily while in the hospital.   Thanks,  Adah Perl, RN, BC-ADM Inpatient Diabetes Coordinator Pager [phone]  (8a-5p)

## 2021-01-23 NOTE — ED Notes (Signed)
Pt removed from bedpan.

## 2021-01-23 NOTE — ED Notes (Addendum)
RT contacted to switch pt from bipap to HFNC per Dr Earleen Newport  Pt repositioned in bed

## 2021-01-23 NOTE — ED Notes (Signed)
Pt placed on bedpan

## 2021-01-23 NOTE — ED Notes (Signed)
MD at bedside. 

## 2021-01-24 ENCOUNTER — Other Ambulatory Visit: Payer: Self-pay

## 2021-01-24 ENCOUNTER — Encounter: Payer: Self-pay | Admitting: Internal Medicine

## 2021-01-24 DIAGNOSIS — I5041 Acute combined systolic (congestive) and diastolic (congestive) heart failure: Secondary | ICD-10-CM | POA: Diagnosis not present

## 2021-01-24 DIAGNOSIS — J9601 Acute respiratory failure with hypoxia: Secondary | ICD-10-CM | POA: Diagnosis not present

## 2021-01-24 DIAGNOSIS — I5042 Chronic combined systolic (congestive) and diastolic (congestive) heart failure: Secondary | ICD-10-CM

## 2021-01-24 DIAGNOSIS — R778 Other specified abnormalities of plasma proteins: Secondary | ICD-10-CM

## 2021-01-24 DIAGNOSIS — I161 Hypertensive emergency: Secondary | ICD-10-CM | POA: Diagnosis not present

## 2021-01-24 DIAGNOSIS — I480 Paroxysmal atrial fibrillation: Secondary | ICD-10-CM

## 2021-01-24 DIAGNOSIS — I160 Hypertensive urgency: Secondary | ICD-10-CM | POA: Diagnosis not present

## 2021-01-24 DIAGNOSIS — I5032 Chronic diastolic (congestive) heart failure: Secondary | ICD-10-CM

## 2021-01-24 DIAGNOSIS — I5043 Acute on chronic combined systolic (congestive) and diastolic (congestive) heart failure: Secondary | ICD-10-CM | POA: Diagnosis not present

## 2021-01-24 LAB — BASIC METABOLIC PANEL
Anion gap: 7 (ref 5–15)
BUN: 21 mg/dL (ref 8–23)
CO2: 30 mmol/L (ref 22–32)
Calcium: 7.8 mg/dL — ABNORMAL LOW (ref 8.9–10.3)
Chloride: 95 mmol/L — ABNORMAL LOW (ref 98–111)
Creatinine, Ser: 0.79 mg/dL (ref 0.44–1.00)
GFR, Estimated: >60 mL/min (ref >60)
Glucose, Bld: 158 mg/dL — ABNORMAL HIGH (ref 70–99)
Potassium: 3.8 mmol/L (ref 3.5–5.1)
Sodium: 132 mmol/L — ABNORMAL LOW (ref 135–145)

## 2021-01-24 LAB — GLUCOSE, CAPILLARY
Glucose-Capillary: 150 mg/dL — ABNORMAL HIGH (ref 70–99)
Glucose-Capillary: 142 mg/dL — ABNORMAL HIGH (ref 70–99)
Glucose-Capillary: 172 mg/dL — ABNORMAL HIGH (ref 70–99)
Glucose-Capillary: 191 mg/dL — ABNORMAL HIGH (ref 70–99)

## 2021-01-24 LAB — CBC
HCT: 36.3 % (ref 36.0–46.0)
Hemoglobin: 12.5 g/dL (ref 12.0–15.0)
MCH: 32.8 pg (ref 26.0–34.0)
MCHC: 34.4 g/dL (ref 30.0–36.0)
MCV: 95.3 fL (ref 80.0–100.0)
Platelets: 185 10*3/uL (ref 150–400)
RBC: 3.81 MIL/uL — ABNORMAL LOW (ref 3.87–5.11)
RDW: 13.3 % (ref 11.5–15.5)
WBC: 15.8 10*3/uL — ABNORMAL HIGH (ref 4.0–10.5)
nRBC: 0 % (ref 0.0–0.2)

## 2021-01-24 LAB — APTT
aPTT: 38 seconds — ABNORMAL HIGH (ref 24–36)
aPTT: 149 seconds — ABNORMAL HIGH (ref 24–36)
aPTT: 72 seconds — ABNORMAL HIGH (ref 24–36)

## 2021-01-24 LAB — TROPONIN I (HIGH SENSITIVITY): Troponin I (High Sensitivity): 481 ng/L (ref <18)

## 2021-01-24 LAB — HEPARIN LEVEL (UNFRACTIONATED): Heparin Unfractionated: >1.1 IU/mL — ABNORMAL HIGH (ref 0.30–0.70)

## 2021-01-24 LAB — PROCALCITONIN: Procalcitonin: 0.21 ng/mL

## 2021-01-24 MED ORDER — SODIUM CHLORIDE 0.9% FLUSH
3.0000 mL | Freq: Two times a day (BID) | INTRAVENOUS | Status: DC
  Administered 2021-01-25: 3 mL via INTRAVENOUS

## 2021-01-24 MED ORDER — SODIUM CHLORIDE 0.9 % IV BOLUS
250.0000 mL | Freq: Once | INTRAVENOUS | Status: AC
  Administered 2021-01-24: 250 mL via INTRAVENOUS

## 2021-01-24 MED ORDER — SODIUM CHLORIDE 0.9 % IV SOLN: INTRAVENOUS | Status: DC

## 2021-01-24 MED ORDER — SODIUM CHLORIDE 0.9 % IV SOLN: 250.0000 mL | INTRAVENOUS | Status: DC | PRN

## 2021-01-24 MED ORDER — AMIODARONE HCL 200 MG PO TABS
400.0000 mg | ORAL_TABLET | Freq: Two times a day (BID) | ORAL | Status: DC
  Administered 2021-01-24 – 2021-01-30 (×13): 400 mg via ORAL
  Filled 2021-01-24 (×12): qty 2

## 2021-01-24 MED ORDER — SODIUM CHLORIDE 0.9% FLUSH
3.0000 mL | INTRAVENOUS | Status: DC | PRN
Start: 2021-01-24 — End: 2021-01-25

## 2021-01-24 MED ORDER — SODIUM CHLORIDE 0.9 % IV BOLUS
150.0000 mL | Freq: Once | INTRAVENOUS | Status: AC
  Administered 2021-01-24: 150 mL via INTRAVENOUS

## 2021-01-24 MED ORDER — HEPARIN (PORCINE) 25000 UT/250ML-% IV SOLN
800.0000 [IU]/h | INTRAVENOUS | Status: DC
  Administered 2021-01-24: 800 [IU]/h via INTRAVENOUS
  Filled 2021-01-24: qty 250

## 2021-01-24 MED ORDER — LOSARTAN POTASSIUM 25 MG PO TABS
12.5000 mg | ORAL_TABLET | Freq: Every day | ORAL | Status: DC
  Administered 2021-01-25 – 2021-01-31 (×7): 12.5 mg via ORAL
  Filled 2021-01-24 (×8): qty 1

## 2021-01-24 NOTE — Progress Notes (Signed)
Progress Note  Patient Name: Bonnie Rowland Date of Encounter: 01/24/2021  Primary Cardiologist: Rockey Situ  Subjective   Dyspnea significantly improved when compared to yesterday, though not back to baseline.  No chest pain or palpitations.  Lower extremity swelling improved.  Orthopnea persists though is improving.  Documented urine output 1.8 L for the admission, though she reported vigorous urine output in the ED.  Weight this morning of 89 kg which is down approximately half a kilogram when compared to her recent outpatient office visits.  Renal function is stable.  High-sensitivity troponin peaked at 692 and is downtrending.  She was transitioned from Eliquis to heparin drip given elevated troponin with last dose of heparin occurring approximately 5 PM on 7/25.  Echo this admission showed a slight reduction in LV SF with an EF of 45 to 50% with severe hypokinesis of the anterior and anteroseptal wall.  Inpatient Medications    Scheduled Meds:  acidophilus  1 capsule Oral QHS   amiodarone  400 mg Oral BID   aspirin EC  81 mg Oral Daily   furosemide  40 mg Intravenous BID   insulin aspart  0-15 Units Subcutaneous TID WC   insulin aspart  0-5 Units Subcutaneous QHS   insulin glargine  15 Units Subcutaneous QHS   losartan  12.5 mg Oral Daily   metoprolol tartrate  12.5 mg Oral Q6H   multivitamin-lutein  1 capsule Oral BID   rosuvastatin  10 mg Oral QHS   tiZANidine  2 mg Oral QHS   Continuous Infusions:  heparin 1,000 Units/hr (01/24/21 0506)   PRN Meds: acetaminophen **OR** acetaminophen, fluticasone, ondansetron **OR** ondansetron (ZOFRAN) IV, polyvinyl alcohol   Vital Signs    Vitals:   01/24/21 0230 01/24/21 0607 01/24/21 0751 01/24/21 0923  BP: 104/64 109/60 103/69 (!) 106/51  Pulse:  (!) 101 88   Resp:  14 18   Temp:  98.8 F (37.1 C) 98.3 F (36.8 C)   TempSrc:  Oral    SpO2:  100% 92%   Weight:      Height:        Intake/Output Summary (Last 24 hours)  at 01/24/2021 1036 Last data filed at 01/24/2021 0931 Gross per 24 hour  Intake 390 ml  Output 1350 ml  Net -960 ml   Filed Weights   01/24/21 0100  Weight: 89 kg    Telemetry    Sinus rhythm with development of A. fib at 1:48 AM which persists this morning - Personally Reviewed  ECG    A. fib, 104 bpm, occasional PVCs versus aberrancy, poor R wave progression, low voltage, prolonged QT, nonspecific ST-T changes - Personally Reviewed  Physical Exam   GEN: No acute distress.   Neck: JVD elevated at approximately 8 cm. Cardiac: IRIR, II/VI systolic murmur, no rubs, or gallops.  Respiratory: Crackles along the bilateral bases.  GI: Soft, nontender, non-distended.   MS: 1+ lower extremity edema of the calves; No deformity. Neuro:  Alert and oriented x 3; Nonfocal.  Psych: Normal affect.  Labs    Chemistry Recent Labs  Lab 01/23/21 0413 01/24/21 0419  NA 134* 132*  K 4.3 3.8  CL 96* 95*  CO2 28 30  GLUCOSE 284* 158*  BUN 21 21  CREATININE 0.88 0.79  CALCIUM 9.0 7.8*  PROT 7.1  --   ALBUMIN 4.0  --   AST 42*  --   ALT 34  --   ALKPHOS 81  --  BILITOT 1.6*  --   GFRNONAA >60 >60  ANIONGAP 10 7     Hematology Recent Labs  Lab 01/22/21 2321 01/24/21 0419  WBC 11.3* 15.8*  RBC 4.25 3.81*  HGB 13.7 12.5  HCT 40.7 36.3  MCV 95.8 95.3  MCH 32.2 32.8  MCHC 33.7 34.4  RDW 13.2 13.3  PLT 222 185    Cardiac EnzymesNo results for input(s): TROPONINI in the last 168 hours. No results for input(s): TROPIPOC in the last 168 hours.   BNP Recent Labs  Lab 01/22/21 2321  BNP 237.3*     DDimer No results for input(s): DDIMER in the last 168 hours.   Radiology    DG Chest 2 View  Result Date: 01/23/2021 IMPRESSION: 1. Mild bibasilar atelectasis and/or infiltrate. 2. A very mild component of superimposed interstitial edema cannot be excluded. Electronically Signed   By: Virgina Norfolk M.D.   On: 01/23/2021 00:05   DG Chest Port 1 View  Result Date:  01/23/2021 IMPRESSION: Moderate to marked severity bilateral infiltrates, increased in severity when compared to the prior study. Electronically Signed   By: Virgina Norfolk M.D.   On: 01/23/2021 03:17   Cardiac Studies   2D echo 01/23/2021: 1. Left ventricular ejection fraction, by estimation, is 45 to 50%. The  left ventricle has mildly decreased function. The left ventricle  demonstrates regional wall motion abnormalities (see scoring  diagram/findings for description). There is mild left  ventricular hypertrophy. Left ventricular diastolic function could not be  evaluated. There is severe hypokinesis of the left ventricular, entire  anterior wall and anteroseptal wall.   2. Right ventricular systolic function is moderately reduced. The right  ventricular size is normal. Mildly increased right ventricular wall  thickness.   3. The mitral valve is degenerative. Trivial mitral valve regurgitation.   4. The aortic valve is tricuspid. Aortic valve regurgitation not well  assessed.  __________  2D echo 09/2020: 1. Left ventricular ejection fraction, by estimation, is 60 to 65%. The  left ventricle has normal function. The left ventricle has no regional  wall motion abnormalities. Left ventricular diastolic parameters are  indeterminate.   2. Right ventricular systolic function is normal. The right ventricular  size is normal. There is normal pulmonary artery systolic pressure.   3. Left atrial size was mildly dilated.   4. The mitral valve is normal in structure. Mild mitral valve  regurgitation.  Patient Profile     80 y.o. female with history of normal coronary arteries by LHC in 09/2005, persistent A. Fib status post DCCV on 11/30/2020 with recurrent Afib s/p repeat DCCV on amiodarone 01/20/2021, postoperative atrial flutter in 01/2015, less than 50% left renal artery stenosis by imaging in 2021, 1 to 39% bilateral ICA carotid stenosis by imaging in 2017, DM2, HTN, HLD, and obesity who  is being seen today for the evaluation of acute hypoxic respiratory failure at the request of Dr. Damita Dunnings.  Assessment & Plan    1.  Acute hypoxic respiratory failure: -Improving -Continue IV Lasix 40 mg bid with KCl repletion as indicated -Cannot exclude some degree of flash pulmonary edema -Cannot exclude possible early case of amiodarone toxicity, though for now, would continue amiodarone with plans to repeat CXR following diuresis and if symptoms/imaging are unchanged at that time, may need to consider stopping amiodarone -Wean supplemental oxygen as able   2. Hypertensive urgency with RAS: -Prior renal artery ultrasound in 08/2019 showed < 50% left RAS -BP much improved to slightly  soft this morning leading to the holding of losartan -Continue current therapy as BP allows   3.  Elevated troponin/acute systolic dysfunction with acute on chronic HFpEF: -Volume status is improving though she does remain volume up -No chest pain -Mildly elevated high-sensitivity troponin peaking at 692 and subsequently down trending possibly in the setting of supply demand ischemia with hypertensive urgency and volume overload.  However, cannot exclude ischemic component given mild decrease in LVSF with associated WMA versus stress-induced cardiomyopathy -Heparin drip as below -IV Lasix as above -Losartan -Lopressor -N.p.o. at midnight -Plan for [facility] on 7/27 -Risks and benefits of cardiac catheterization have been discussed with the patient including risks of bleeding, bruising, infection, kidney damage, stroke, heart attack, urgent need for bypass, injury to limb, and death. The patient understands these risks and is willing to proceed with the procedure. All questions have been answered and concerns listened to -Strict I/O -Daily standing scale weights   4. Persistent Afib/flutter: -Status post recent briefly successful DCCV in 11/2020 with repeat briefly successful DCCV on amiodarone  01/20/2021 -Since her admission she has been converting to sinus rhythm with episodes of A. fib intermittently -She remains in A. fib since approximately 1:48 AM this morning -Reloaded with oral amiodarone 400 mg twice daily -CHA2DS2-VASc at least 6 (CHF, HTN, age x2, DM2, sex category) -Continue heparin drip while admitted until it is clear no further invasive procedures are needed, last dose of Eliquis was approximately 5 PM on 7/25  5.  HLD: -LDL 38 in 07/2020 -Crestor   For questions or updates, please contact [place] Please consult [email or web address] for contact info under Cardiology/STEMI.    Signed, [name], [name] [place] Pager: [phone] 01/24/2021, 10:36 AM

## 2021-01-24 NOTE — Progress Notes (Signed)
PT Cancellation Note  Patient Details Name: Bonnie Rowland MRN: [id number] DOB: 03-13-1941   Cancelled Treatment:    Reason Eval/Treat Not Completed: Medical issues which prohibited therapy. Orders received and chart reviewed. Per EMR, pt placed on heparin drip (7/26 at 0500) for a-fib along with elevated troponin recorded at 692 (7/25 at 17:22) but is trending down to 481 (7/26 at 00:32). PT will hold at this time until pt is medically stable.    Salem Caster. Fairly IV, PT, DPT Physical Therapist- [facility]  01/24/2021, 8:35 AM

## 2021-01-24 NOTE — H&P (View-Only) (Signed)
Progress Note  Patient Name: Bonnie Rowland Date of Encounter: 01/24/2021  Primary Cardiologist: Rockey Situ  Subjective   Dyspnea significantly improved when compared to yesterday, though not back to baseline.  No chest pain or palpitations.  Lower extremity swelling improved.  Orthopnea persists though is improving.  Documented urine output 1.8 L for the admission, though she reported vigorous urine output in the ED.  Weight this morning of 89 kg which is down approximately half a kilogram when compared to her recent outpatient office visits.  Renal function is stable.  High-sensitivity troponin peaked at 692 and is downtrending.  She was transitioned from Eliquis to heparin drip given elevated troponin with last dose of heparin occurring approximately 5 PM on 7/25.  Echo this admission showed a slight reduction in LV SF with an EF of 45 to 50% with severe hypokinesis of the anterior and anteroseptal wall.  Inpatient Medications    Scheduled Meds:  acidophilus  1 capsule Oral QHS   amiodarone  400 mg Oral BID   aspirin EC  81 mg Oral Daily   furosemide  40 mg Intravenous BID   insulin aspart  0-15 Units Subcutaneous TID WC   insulin aspart  0-5 Units Subcutaneous QHS   insulin glargine  15 Units Subcutaneous QHS   losartan  12.5 mg Oral Daily   metoprolol tartrate  12.5 mg Oral Q6H   multivitamin-lutein  1 capsule Oral BID   rosuvastatin  10 mg Oral QHS   tiZANidine  2 mg Oral QHS   Continuous Infusions:  heparin 1,000 Units/hr (01/24/21 0506)   PRN Meds: acetaminophen **OR** acetaminophen, fluticasone, ondansetron **OR** ondansetron (ZOFRAN) IV, polyvinyl alcohol   Vital Signs    Vitals:   01/24/21 0230 01/24/21 0607 01/24/21 0751 01/24/21 0923  BP: 104/64 109/60 103/69 (!) 106/51  Pulse:  (!) 101 88   Resp:  14 18   Temp:  98.8 F (37.1 C) 98.3 F (36.8 C)   TempSrc:  Oral    SpO2:  100% 92%   Weight:      Height:        Intake/Output Summary (Last 24 hours)  at 01/24/2021 1036 Last data filed at 01/24/2021 0931 Gross per 24 hour  Intake 390 ml  Output 1350 ml  Net -960 ml   Filed Weights   01/24/21 0100  Weight: 89 kg    Telemetry    Sinus rhythm with development of A. fib at 1:48 AM which persists this morning - Personally Reviewed  ECG    A. fib, 104 bpm, occasional PVCs versus aberrancy, poor R wave progression, low voltage, prolonged QT, nonspecific ST-T changes - Personally Reviewed  Physical Exam   GEN: No acute distress.   Neck: JVD elevated at approximately 8 cm. Cardiac: IRIR, II/VI systolic murmur, no rubs, or gallops.  Respiratory: Crackles along the bilateral bases.  GI: Soft, nontender, non-distended.   MS: 1+ lower extremity edema of the calves; No deformity. Neuro:  Alert and oriented x 3; Nonfocal.  Psych: Normal affect.  Labs    Chemistry Recent Labs  Lab 01/23/21 0413 01/24/21 0419  NA 134* 132*  K 4.3 3.8  CL 96* 95*  CO2 28 30  GLUCOSE 284* 158*  BUN 21 21  CREATININE 0.88 0.79  CALCIUM 9.0 7.8*  PROT 7.1  --   ALBUMIN 4.0  --   AST 42*  --   ALT 34  --   ALKPHOS 81  --  BILITOT 1.6*  --   GFRNONAA >60 >60  ANIONGAP 10 7     Hematology Recent Labs  Lab 01/22/21 2321 01/24/21 0419  WBC 11.3* 15.8*  RBC 4.25 3.81*  HGB 13.7 12.5  HCT 40.7 36.3  MCV 95.8 95.3  MCH 32.2 32.8  MCHC 33.7 34.4  RDW 13.2 13.3  PLT 222 185    Cardiac EnzymesNo results for input(s): TROPONINI in the last 168 hours. No results for input(s): TROPIPOC in the last 168 hours.   BNP Recent Labs  Lab 01/22/21 2321  BNP 237.3*     DDimer No results for input(s): DDIMER in the last 168 hours.   Radiology    DG Chest 2 View  Result Date: 01/23/2021 IMPRESSION: 1. Mild bibasilar atelectasis and/or infiltrate. 2. A very mild component of superimposed interstitial edema cannot be excluded. Electronically Signed   By: Virgina Norfolk M.D.   On: 01/23/2021 00:05   DG Chest Port 1 View  Result Date:  01/23/2021 IMPRESSION: Moderate to marked severity bilateral infiltrates, increased in severity when compared to the prior study. Electronically Signed   By: Virgina Norfolk M.D.   On: 01/23/2021 03:17   Cardiac Studies   2D echo 01/23/2021: 1. Left ventricular ejection fraction, by estimation, is 45 to 50%. The  left ventricle has mildly decreased function. The left ventricle  demonstrates regional wall motion abnormalities (see scoring  diagram/findings for description). There is mild left  ventricular hypertrophy. Left ventricular diastolic function could not be  evaluated. There is severe hypokinesis of the left ventricular, entire  anterior wall and anteroseptal wall.   2. Right ventricular systolic function is moderately reduced. The right  ventricular size is normal. Mildly increased right ventricular wall  thickness.   3. The mitral valve is degenerative. Trivial mitral valve regurgitation.   4. The aortic valve is tricuspid. Aortic valve regurgitation not well  assessed.  __________  2D echo 09/2020: 1. Left ventricular ejection fraction, by estimation, is 60 to 65%. The  left ventricle has normal function. The left ventricle has no regional  wall motion abnormalities. Left ventricular diastolic parameters are  indeterminate.   2. Right ventricular systolic function is normal. The right ventricular  size is normal. There is normal pulmonary artery systolic pressure.   3. Left atrial size was mildly dilated.   4. The mitral valve is normal in structure. Mild mitral valve  regurgitation.  Patient Profile     80 y.o. female with history of normal coronary arteries by [facility] in 09/2005, persistent A. Fib status post DCCV on 11/30/2020 with recurrent Afib s/p repeat DCCV on amiodarone 01/20/2021, postoperative atrial flutter in 01/2015, less than 50% left renal artery stenosis by imaging in 2021, 1 to 39% bilateral ICA carotid stenosis by imaging in 2017, DM2, HTN, HLD, and obesity who  is being seen today for the evaluation of acute hypoxic respiratory failure at the request of Dr. Damita Dunnings.  Assessment & Plan    1.  Acute hypoxic respiratory failure: -Improving -Continue IV Lasix 40 mg bid with KCl repletion as indicated -Cannot exclude some degree of flash pulmonary edema -Cannot exclude possible early case of amiodarone toxicity, though for now, would continue amiodarone with plans to repeat CXR following diuresis and if symptoms/imaging are unchanged at that time, may need to consider stopping amiodarone -Wean supplemental oxygen as able   2. Hypertensive urgency with RAS: -Prior renal artery ultrasound in 08/2019 showed < 50% left RAS -BP much improved to slightly  soft this morning leading to the holding of losartan -Continue current therapy as BP allows   3.  Elevated troponin/acute systolic dysfunction with acute on chronic HFpEF: -Volume status is improving though she does remain volume up -No chest pain -Mildly elevated high-sensitivity troponin peaking at 692 and subsequently down trending possibly in the setting of supply demand ischemia with hypertensive urgency and volume overload.  However, cannot exclude ischemic component given mild decrease in LVSF with associated WMA versus stress-induced cardiomyopathy -Heparin drip as below -IV Lasix as above -Losartan -Lopressor -N.p.o. at midnight -Plan for [facility] on 7/27 -Risks and benefits of cardiac catheterization have been discussed with the patient including risks of bleeding, bruising, infection, kidney damage, stroke, heart attack, urgent need for bypass, injury to limb, and death. The patient understands these risks and is willing to proceed with the procedure. All questions have been answered and concerns listened to -Strict I/O -Daily standing scale weights   4. Persistent Afib/flutter: -Status post recent briefly successful DCCV in 11/2020 with repeat briefly successful DCCV on amiodarone  01/20/2021 -Since her admission she has been converting to sinus rhythm with episodes of A. fib intermittently -She remains in A. fib since approximately 1:48 AM this morning -Reloaded with oral amiodarone 400 mg twice daily -CHA2DS2-VASc at least 6 (CHF, HTN, age x2, DM2, sex category) -Continue heparin drip while admitted until it is clear no further invasive procedures are needed, last dose of Eliquis was approximately 5 PM on 7/25  5.  HLD: -LDL 38 in 07/2020 -Crestor   For questions or updates, please contact [place] Please consult [email or web address] for contact info under Cardiology/STEMI.    Signed, [name], [name] [place] Pager: [phone] 01/24/2021, 10:36 AM

## 2021-01-24 NOTE — Plan of Care (Signed)
  Problem: Education: Goal: Knowledge of General Education information will improve Description Including pain rating scale, medication(s)/side effects and non-pharmacologic comfort measures Outcome: Progressing   Problem: Health Behavior/Discharge Planning: Goal: Ability to manage health-related needs will improve Outcome: Progressing   

## 2021-01-24 NOTE — Progress Notes (Signed)
Patient ID: Bonnie Rowland, female   DOB: 07-28-1940, 80 y.o.   MRN: [id number] [facility] PROGRESS NOTE  Bonnie Rowland [id number] DOB: 1941-04-11 DOA: 01/23/2021 PCP: Steele Sizer, MD  HPI/Subjective: Patient feeling little bit better today.  She is breathing comfortably this morning on high flow nasal cannula.  Looks like this afternoon over to regular nasal cannula.  Urinating well with the IV Lasix.  Initially admitted with accelerated hypertension shortness of breath and was placed on BiPAP.  Objective: Vitals:   01/24/21 1550 01/24/21 1701  BP: 124/70   Pulse: 92   Resp: 17   Temp: 98.1 F (36.7 C)   SpO2: 100% 99%    Intake/Output Summary (Last 24 hours) at 01/24/2021 1817 Last data filed at 01/24/2021 1751 Gross per 24 hour  Intake 1014.18 ml  Output 2275 ml  Net -1260.82 ml   Filed Weights   01/24/21 0100  Weight: 89 kg    ROS: Review of Systems  Respiratory:  Positive for cough and shortness of breath.   Cardiovascular:  Negative for chest pain.  Gastrointestinal:  Negative for abdominal pain, nausea and vomiting.  Exam: Physical Exam HENT:     Head: Normocephalic.     Mouth/Throat:     Pharynx: No oropharyngeal exudate.  Eyes:     General: Lids are normal.     Conjunctiva/sclera: Conjunctivae normal.  Cardiovascular:     Rate and Rhythm: Normal rate. Rhythm irregularly irregular.     Heart sounds: Normal heart sounds, S1 normal and S2 normal.  Pulmonary:     Breath sounds: Examination of the right-lower field reveals decreased breath sounds and rales. Examination of the left-lower field reveals decreased breath sounds and rales. Decreased breath sounds and rales present. No wheezing or rhonchi.  Abdominal:     Palpations: Abdomen is soft.     Tenderness: There is no abdominal tenderness.  Musculoskeletal:     Right ankle: Swelling present.     Left ankle: Swelling present.  Skin:    General: Skin is warm.     Findings: No rash.   Neurological:     Mental Status: She is alert and oriented to person, place, and time.     Data Reviewed: Basic Metabolic Panel: Recent Labs  Lab 01/23/21 0413 01/24/21 0419  NA 134* 132*  K 4.3 3.8  CL 96* 95*  CO2 28 30  GLUCOSE 284* 158*  BUN 21 21  CREATININE 0.88 0.79  CALCIUM 9.0 7.8*   Liver Function Tests: Recent Labs  Lab 01/23/21 0413  AST 42*  ALT 34  ALKPHOS 81  BILITOT 1.6*  PROT 7.1  ALBUMIN 4.0   CBC: Recent Labs  Lab 01/22/21 2321 01/24/21 0419  WBC 11.3* 15.8*  HGB 13.7 12.5  HCT 40.7 36.3  MCV 95.8 95.3  PLT 222 185    BNP (last 3 results) Recent Labs    01/22/21 2321  BNP 237.3*    CBG: Recent Labs  Lab 01/23/21 1105 01/23/21 1701 01/23/21 2146 01/24/21 0754 01/24/21 1151  GLUCAP 198* 263* 180* 150* 142*    Recent Results (from the past 240 hour(s))  SARS CORONAVIRUS 2 (TAT 6-24 HRS) Nasopharyngeal Nasopharyngeal Swab     Status: None   Collection Time: 01/23/21 12:43 AM   Specimen: Nasopharyngeal Swab  Result Value Ref Range Status   SARS Coronavirus 2 NEGATIVE NEGATIVE Final    Comment: (NOTE) SARS-CoV-2 target nucleic acids are NOT DETECTED.  The SARS-CoV-2 RNA is generally detectable  in upper and lower respiratory specimens during the acute phase of infection. Negative results do not preclude SARS-CoV-2 infection, do not rule out co-infections with other pathogens, and should not be used as the sole basis for treatment or other patient management decisions. Negative results must be combined with clinical observations, patient history, and epidemiological information. The expected result is Negative.  Fact Sheet for Patients: SugarRoll.be  Fact Sheet for Healthcare Providers: https://www.woods-mathews.com/  This test is not yet approved or cleared by the [place] FDA and  has been authorized for detection and/or diagnosis of SARS-CoV-2 by FDA under an Emergency  Use Authorization (EUA). This EUA will remain  in effect (meaning this test can be used) for the duration of the COVID-19 declaration under Se ction 564(b)(1) of the Act, 21 U.S.C. section 360bbb-3(b)(1), unless the authorization is terminated or revoked sooner.  Performed at [facility], [place] [address] [place], [place] [address]   Culture, blood (routine x 2)     Status: None (Preliminary result)   Collection Time: 01/23/21  4:02 AM   Specimen: BLOOD  Result Value Ref Range Status   Specimen Description BLOOD LEFT [facility]  Final   Special Requests   Final    BOTTLES DRAWN AEROBIC AND ANAEROBIC Blood Culture adequate volume   Culture   Final    NO GROWTH 1 DAY Performed at [facility], [address] [place], [place] [address]    Report Status PENDING  Incomplete  Culture, blood (routine x 2)     Status: None (Preliminary result)   Collection Time: 01/23/21  4:13 AM   Specimen: BLOOD  Result Value Ref Range Status   Specimen Description BLOOD LEFT HAND  Final   Special Requests   Final    BOTTLES DRAWN AEROBIC AND ANAEROBIC Blood Culture adequate volume   Culture   Final    NO GROWTH 1 DAY Performed at [facility], [address], [place], [place] [address]    Report Status PENDING  Incomplete     Studies: DG Chest 2 View  Result Date: 01/23/2021 CLINICAL DATA:  Shortness of breath and hypertension. EXAM: CHEST - 2 VIEW COMPARISON:  February 21, 2015 FINDINGS: The lungs are hyperinflated. Mild diffusely increased lung markings are noted. Mild areas of atelectasis and/or infiltrate are seen within the bilateral lung bases. There is no evidence of a pleural effusion or pneumothorax. The cardiac silhouette is borderline in size. Degenerative changes seen throughout the thoracic spine. IMPRESSION: 1. Mild bibasilar atelectasis and/or infiltrate. 2. A very mild component of superimposed interstitial edema cannot be excluded. Electronically Signed   By:  Virgina Norfolk M.D.   On: 01/23/2021 00:05   DG Chest Port 1 View  Result Date: 01/23/2021 CLINICAL DATA:  Respiratory distress. EXAM: PORTABLE CHEST 1 VIEW COMPARISON:  January 22, 2021 FINDINGS: Moderate to marked severity infiltrates are seen within the bilateral lung bases and bilateral upper lobes, right greater than left. This is increased in severity when compared to the prior study. There is no evidence of a pleural effusion or pneumothorax. The cardiac silhouette is borderline in size. Degenerative changes are seen throughout the thoracic spine. IMPRESSION: Moderate to marked severity bilateral infiltrates, increased in severity when compared to the prior study. Electronically Signed   By: Virgina Norfolk M.D.   On: 01/23/2021 03:17   ECHOCARDIOGRAM COMPLETE  Result Date: 01/23/2021    ECHOCARDIOGRAM REPORT   Patient Name:   Bonnie Rowland [facility] Date of Exam:  01/23/2021 Medical Rec #:  [id number]          Height:       63.0 in Accession #:    [id number]         Weight:       197.1 lb Date of Birth:  10-May-1941          BSA:          1.922 m Patient Age:    69 years           BP:           131/71 mmHg Patient Gender: F                  HR:           93 bpm. Exam Location:  [facility] Procedure: 2D Echo, Cardiac Doppler and Color Doppler Indications:     CHF-acute diastolic [id number]  History:         Patient has prior history of Echocardiogram examinations, most                  recent 10/20/2020. Risk Factors:Hypertension.  Sonographer:     Sherrie Sport RDCS (AE) Referring Phys:  [id number] Athena Masse Diagnosing Phys: Nelva Bush MD  Sonographer Comments: Technically challenging study due to limited acoustic windows, no apical window and no subcostal window. Pt on bipap---could not lie on left side due to pain. IMPRESSIONS  1. Left ventricular ejection fraction, by estimation, is 45 to 50%. The left ventricle has mildly decreased function. The left ventricle demonstrates regional wall motion abnormalities  (see scoring diagram/findings for description). There is mild left ventricular hypertrophy. Left ventricular diastolic function could not be evaluated. There is severe hypokinesis of the left ventricular, entire anterior wall and anteroseptal wall.  2. Right ventricular systolic function is moderately reduced. The right ventricular size is normal. Mildly increased right ventricular wall thickness.  3. The mitral valve is degenerative. Trivial mitral valve regurgitation.  4. The aortic valve is tricuspid. Aortic valve regurgitation not well assessed. FINDINGS  Left Ventricle: Left ventricular ejection fraction, by estimation, is 45 to 50%. The left ventricle has mildly decreased function. The left ventricle demonstrates regional wall motion abnormalities. Severe hypokinesis of the left ventricular, entire anterior wall and anteroseptal wall. The left ventricular internal cavity size was normal in size. There is mild left ventricular hypertrophy. Left ventricular diastolic function could not be evaluated. Right Ventricle: The right ventricular size is normal. Mildly increased right ventricular wall thickness. Right ventricular systolic function is moderately reduced. Left Atrium: Left atrial size was not well visualized. Right Atrium: Right atrial size was not well visualized. Pericardium: Trivial pericardial effusion is present. Mitral Valve: The mitral valve is degenerative in appearance. There is mild thickening of the mitral valve leaflet(s). Mild mitral annular calcification. Trivial mitral valve regurgitation. Tricuspid Valve: The tricuspid valve is not well visualized. Tricuspid valve regurgitation is trivial. Aortic Valve: The aortic valve is tricuspid. Aortic valve regurgitation not well assessed. Pulmonic Valve: The pulmonic valve was not well visualized. Aorta: The aortic root is normal in size and structure. Pulmonary Artery: The pulmonary artery is not well seen. IAS/Shunts: The interatrial septum was not  assessed. Additional Comments: There is pleural effusion in the left lateral region.  LEFT VENTRICLE PLAX 2D LVIDd:         3.74 cm LVIDs:         2.89 cm LV PW:  1.35 cm LV IVS:        1.13 cm LVOT diam:     2.00 cm LVOT Area:     3.14 cm  LEFT ATRIUM         Index LA diam:    4.00 cm 2.08 cm/m                        PULMONIC VALVE AORTA                 PV Vmax:        0.55 m/s Ao Root diam: 2.50 cm PV Peak grad:   1.2 mmHg                       RVOT Peak grad: 2 mmHg   SHUNTS Systemic Diam: 2.00 cm Nelva Bush MD Electronically signed by Nelva Bush MD Signature Date/Time: 01/23/2021/4:59:35 PM    Final     Scheduled Meds:  acidophilus  1 capsule Oral QHS   amiodarone  400 mg Oral BID   aspirin EC  81 mg Oral Daily   furosemide  40 mg Intravenous BID   insulin aspart  0-15 Units Subcutaneous TID WC   insulin aspart  0-5 Units Subcutaneous QHS   insulin glargine  15 Units Subcutaneous QHS   losartan  12.5 mg Oral Daily   metoprolol tartrate  12.5 mg Oral Q6H   multivitamin-lutein  1 capsule Oral BID   rosuvastatin  10 mg Oral QHS   tiZANidine  2 mg Oral QHS   Continuous Infusions:  heparin 800 Units/hr (01/24/21 1751)   Brief history admitted 7/25 with accelerated hypertension shortness of breath and CHF and initially required BiPAP in order to oxygenate.  Patient was started on IV Lasix for diuresis.  Patient had a recent cardioversion but was in atrial fibrillation on the heart monitor in the ER.  Patient admitted with heart failure.  Assessment/Plan:  Acute hypoxic respiratory failure initially requiring BiPAP in order to oxygenate.  Pulse ox of 77% on 2 L in the ER.  She was able to be converted over to high flow nasal cannula 6 L.  This afternoon looks like converted down to 4 L.  With procalcitonin negative low no antibiotics were given. Hypertensive urgency in the emergency room likely secondary to shortness of breath.  Blood pressure better controlled after breathing  improved. Acute on chronic combined systolic and diastolic congestive heart failure.  EF 45 to 50%.  Patient on metoprolol.  Had to decrease the dose of losartan secondary relative hypotension.  On Lasix 40 mg IV twice daily. Elevated troponin could be secondary to demand ischemia with acute hypoxic respiratory failure and hypertensive urgency.  Cardiology to set up for cardiac catheterization. Paroxysmal atrial fibrillation.  Patient had recent cardioversion and now in atrial fibrillation.  Cardiology reloading amiodarone.  Holding Eliquis and started on heparin drip for cardiac catheterization. Type 2 diabetes mellitus with hyperlipidemia.  Continue Crestor.  Glargine insulin at night with sliding scale insulin. Weakness.  Physical therapy evaluation Lactic acidosis on presentation.  With low procalcitonin we will hold off on antibiotic        Code Status:     Code Status Orders  (From admission, onward)           Start     Ordered   01/23/21 0314  Full code  Continuous        01/23/21 0315  Code Status History     Date Active Date Inactive Code Status Order ID Comments User Context   04/16/2018 1231 05/13/2020 1123 DNR [id number]  Fredderick Severance, NP Outpatient   05/23/2015 1803 05/24/2015 1055 Full Code [id number]  Dereck Leep, MD Inpatient   03/30/2015 2024 04/02/2015 1759 Full Code [id number]  Dereck Leep, MD Inpatient      Advance Directive Documentation    Flowsheet Row Most Recent Value  Type of Advance Directive Healthcare Power of Attorney, Living will  Pre-existing out of facility DNR order (yellow form or pink MOST form) --  "MOST" Form in Place? --      Family Communication: Left message for husband on the phone Disposition Plan: Status is: Inpatient  Dispo: The patient is from: Home              Anticipated d/c is to: Home              Patient currently still on oxygen and going to be set up for cardiac catheterization.  Still  diuresing for CHF.   Difficult to place patient.  No.  Consultants: Cardiology  Time spent: 27 minutes, case discussed with cardiology  [place]

## 2021-01-24 NOTE — Progress Notes (Signed)
Per pharmacist stop heparin drip for one hour. Then restarted at 1500 with rate of 8. Will continue to monitor

## 2021-01-24 NOTE — Progress Notes (Signed)
Made Dr. Leslye Peer aware patient bp 106/51, heart rate 99. Per md hold am dose of 12.5 mg losartan. Will continue to monitor closely

## 2021-01-24 NOTE — TOC Initial Note (Signed)
Transition of Care [facility]) - Initial/Assessment Note    Patient Details  Name: Bonnie Rowland MRN: [id number] Date of Birth: Nov 01, 1940  Transition of Care [facility]) CM/SW Contact:    Alberteen Sam, LCSW Phone Number: 01/24/2021, 4:52 PM  Clinical Narrative:                  CSW consulted for heart failure home health screen, spoke with patient's spouse and patient. Confirms patient has PCP Dr. Ancil Boozer and [facility] [address] for prescriptions. Reports she has a walker at home but does not use it, uses a cane at baseline. Reports they would like to see how patient progresses in the hospital to reassess potential for home health services. CSW notes patient currently has no home oxygen, will continue to follow for discharge planning needs as patient becomes more medically stable to dc.    Expected Discharge Plan: [place] Barriers to Discharge: Continued Medical Work up   Patient Goals and CMS Choice   CMS Medicare.gov Compare Post Acute Care list provided to:: Patient Choice offered to / list presented to : Patient  Expected Discharge Plan and Services Expected Discharge Plan: [place]       Living arrangements for the past 2 months: Single Family Home                                      Prior Living Arrangements/Services Living arrangements for the past 2 months: Single Family Home Lives with:: Spouse   Do you feel safe going back to the place where you live?: Yes               Activities of Daily Living Home Assistive Devices/Equipment: Cane (specify quad or straight) ADL Screening (condition at time of admission) Patient's cognitive ability adequate to safely complete daily activities?: Yes Is the patient deaf or have difficulty hearing?: No Does the patient have difficulty seeing, even when wearing glasses/contacts?: No Does the patient have difficulty concentrating, remembering, or making decisions?:  No Patient able to express need for assistance with ADLs?: Yes Does the patient have difficulty dressing or bathing?: No Independently performs ADLs?: Yes (appropriate for developmental age) Does the patient have difficulty walking or climbing stairs?: No Weakness of Legs: None Weakness of Arms/Hands: None  Permission Sought/Granted            Permission granted to share info w Relationship: spouse     Emotional Assessment   Attitude/Demeanor/Rapport: Gracious Affect (typically observed): Calm Orientation: : Oriented to Self, Oriented to Place, Oriented to  Time, Oriented to Situation Alcohol / Substance Use: Not Applicable Psych Involvement: No (comment)  Admission diagnosis:  Shortness of breath [R06.02] Acute pulmonary edema (HCC) [J81.0] Hypoxia [R09.02] Hypertensive emergency [I16.1] Hypertension, unspecified type [I10] Patient Active Problem List   Diagnosis Date Noted   Atrial fibrillation status post cardioversion 01/20/2021(HCC) 01/23/2021   Diabetes mellitus, type II ([place]) 01/23/2021   Hypertensive emergency 01/23/2021   Acute respiratory failure with hypoxia ([place]) 01/23/2021   [place] 04/07/2020   Acute left-sided low back pain without sciatica 11/25/2019   Trochanteric bursitis of left hip 11/25/2019   Plantar fasciitis, bilateral 05/21/2019   Age-related osteoporosis without current pathological fracture 11/05/2017   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([place]) 07/09/2017   Atherosclerosis of abdominal aorta ([place]) 07/09/2017   Bilateral carotid bruits 08/24/2015   Right knee  DJD 03/30/2015   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   Atrial flutter ([place]) 02/22/2015   Chronic pain 01/10/2015   Type 2 diabetes, uncontrolled, 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 ([place]) 01/10/2015   Fatigue 01/10/2015   History of melanoma excision 01/10/2015   Morbid obesity ([place]) 01/10/2015   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   Spinal stenosis, lumbar region, with neurogenic  claudication 12/06/2014   DDD (degenerative disc disease), lumbar 11/14/2014   Greater trochanteric bursitis of both hips 11/14/2014   Piriformis syndrome 11/14/2014   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   Hyperlipemia 11/07/2009   Hypertension, benign 11/07/2009   Atherosclerosis of renal artery ([place]) 11/07/2009   Hyperlipidemia due to type 2 diabetes mellitus ([place]) 11/07/2009   Perennial allergic rhinitis 11/27/2007   Lumbosacral neuritis 01/17/2007   PCP:  Steele Sizer, MD Pharmacy:   [id number] [place] [id number]: [phone]: [phone] [place] [id number]: [phone]: [phone]    Social Determinants of Health (SDOH) Interventions    Readmission Risk Interventions No flowsheet data found.

## 2021-01-24 NOTE — Consult Note (Signed)
ANTICOAGULATION CONSULT NOTE - Follow Up Consult  Pharmacy Consult for Heparin gtt Indication: atrial fibrillation  Allergies  Allergen Reactions   Cyclobenzaprine Hypertension   Cheese Other (See Comments)    bloating   Ciprofloxacin Hcl Other (See Comments)    Muscle pain   Coconut Oil     Upset stomach   Keflex [Cephalexin] Other (See Comments)    Patient prefers not to take this medication due to the side effects, caused tendon issues    Patient Measurements: Height: '5\' 3"'$  (160 cm) Weight: 89 kg (196 lb 3.4 oz) IBW/kg (Calculated) : 52.4 Heparin Dosing Weight: 72.7 kg  Vital Signs: Temp: 98.5 F (36.9 C) (07/26 2053) Temp Source: Oral (07/26 1155) BP: 126/78 (07/26 2053) Pulse Rate: 108 (07/26 2053)  Labs: Recent Labs    01/22/21 2321 01/23/21 0120 01/23/21 0413 01/23/21 1722 01/24/21 0032 01/24/21 0419 01/24/21 1247 01/24/21 2209  HGB 13.7  --   --   --   --  12.5  --   --   HCT 40.7  --   --   --   --  36.3  --   --   PLT 222  --   --   --   --  185  --   --   APTT  --   --   --   --   --  38* 149* 72*  HEPARINUNFRC  --   --   --   --   --  >1.10*  --   --   CREATININE  --   --  0.88  --   --  0.79  --   --   TROPONINIHS 10 87*  --  692* 481*  --   --   --      Estimated Creatinine Clearance: 61.3 mL/min (by C-G formula based on SCr of 0.79 mg/dL).   Medications:  Pt received 0.'5mg'$ /kg dose of lovenox (7/25 0756) and a '5mg'$  dose of eliquis (7/25 1655). 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 Pharmacy consulted for the conversion of Eliquis to Heparin gtt for mgmt of Afib.  Date Time aPTT/HL Rate/Comment 7/25  1655     ---   PM Dose of eliquis '5mg'$   7/26 1247 149   7/26    2209     72   Goal of Therapy:  Heparin level 0.3-0.7 units/ml aPTT 66-102s  seconds Monitor platelets by anticoagulation protocol: Yes   Plan:  APTT is therapeutic x 1.   Will continue the heparin infusion at 800 units/hr.   Recheck aPTT level in 8 hours. CBC and heparin level daily while on heparin. Switch to heparin level once aPTT and heparin level correlate.    Lu Duffel, PharmD, BCPS Clinical Pharmacist 01/24/2021 11:23 PM

## 2021-01-24 NOTE — Consult Note (Signed)
ANTICOAGULATION CONSULT NOTE - Follow Up Consult  Pharmacy Consult for Heparin gtt Indication: atrial fibrillation  Allergies  Allergen Reactions   Cyclobenzaprine Hypertension   Cheese Other (See Comments)    bloating   Ciprofloxacin Hcl Other (See Comments)    Muscle pain   Coconut Oil     Upset stomach   Keflex [Cephalexin] Other (See Comments)    Patient prefers not to take this medication due to the side effects, caused tendon issues    Patient Measurements: Height: '5\' 3"'$  (160 cm) Weight: 89 kg (196 lb 3.4 oz) IBW/kg (Calculated) : 52.4 Heparin Dosing Weight: 72.7 kg  Vital Signs: Temp: 98 F (36.7 C) (07/26 1155) Temp Source: Oral (07/26 1155) BP: 103/67 (07/26 1155) Pulse Rate: 87 (07/26 1155)  Labs: Recent Labs    01/22/21 2321 01/23/21 0120 01/23/21 0413 01/23/21 1722 01/24/21 0032 01/24/21 0419 01/24/21 1247  HGB 13.7  --   --   --   --  12.5  --   HCT 40.7  --   --   --   --  36.3  --   PLT 222  --   --   --   --  185  --   APTT  --   --   --   --   --  38* 149*  HEPARINUNFRC  --   --   --   --   --  >1.10*  --   CREATININE  --   --  0.88  --   --  0.79  --   TROPONINIHS 10 87*  --  692* 481*  --   --      Estimated Creatinine Clearance: 61.3 mL/min (by C-G formula based on SCr of 0.79 mg/dL).   Medications:  Pt received 0.'5mg'$ /kg dose of lovenox (7/25 0756) and a '5mg'$  dose of eliquis (7/25 1655). Now transitioning to Hep gtt (7/25 PM>>  Heparin Dosing Weight: 72.7 kg  Assessment: 81yo female w/ h/o Afib (recent DCCV 01/20/2021), post-op Aflutter 01/2015, T2DM, HTN, HLD, and obesity (BMI ~35) presenting with ARF w/ hypoxia. Pharmacy consulted for the conversion of Eliquis to Heparin gtt for mgmt of Afib.  Date Time aPTT/HL Rate/Comment 7/25  1655     ---   PM Dose of eliquis '5mg'$   7/26 1247 149     Goal of Therapy:  Heparin level 0.3-0.7 units/ml aPTT 66-102s seconds Monitor platelets by anticoagulation protocol: Yes   Plan:  APTT is  supratherapeutic. Will hold the heparin infusion for 1 hour and restart heparin infusion at 800 units/hr. Recheck aPTTlevel in 8 hours. CBC and heparin level daily while on heparin. Switch to heparin level once aPTT and heparin level correlate.    Oswald Hillock, PharmD 01/24/2021,1:29 PM

## 2021-01-25 ENCOUNTER — Encounter: Payer: Self-pay | Admitting: Internal Medicine

## 2021-01-25 ENCOUNTER — Encounter
Admission: EM | Disposition: A | Discharge status: Home / Self Care | Payer: Self-pay | Source: Home / Self Care | Attending: Internal Medicine

## 2021-01-25 ENCOUNTER — Other Ambulatory Visit: Payer: Self-pay | Admitting: Family Medicine

## 2021-01-25 DIAGNOSIS — R778 Other specified abnormalities of plasma proteins: Secondary | ICD-10-CM

## 2021-01-25 DIAGNOSIS — I5043 Acute on chronic combined systolic (congestive) and diastolic (congestive) heart failure: Secondary | ICD-10-CM

## 2021-01-25 DIAGNOSIS — I2510 Atherosclerotic heart disease of native coronary artery without angina pectoris: Secondary | ICD-10-CM

## 2021-01-25 DIAGNOSIS — M62830 Muscle spasm of back: Secondary | ICD-10-CM

## 2021-01-25 DIAGNOSIS — J9601 Acute respiratory failure with hypoxia: Secondary | ICD-10-CM | POA: Diagnosis not present

## 2021-01-25 DIAGNOSIS — I480 Paroxysmal atrial fibrillation: Secondary | ICD-10-CM | POA: Diagnosis not present

## 2021-01-25 DIAGNOSIS — I161 Hypertensive emergency: Secondary | ICD-10-CM | POA: Diagnosis not present

## 2021-01-25 HISTORY — PX: RIGHT/LEFT HEART CATH AND CORONARY ANGIOGRAPHY: CATH118266

## 2021-01-25 LAB — CBC
HCT: 37.8 % (ref 36.0–46.0)
Hemoglobin: 12.9 g/dL (ref 12.0–15.0)
MCH: 32.3 pg (ref 26.0–34.0)
MCHC: 34.1 g/dL (ref 30.0–36.0)
MCV: 94.5 fL (ref 80.0–100.0)
Platelets: 188 10*3/uL (ref 150–400)
RBC: 4 MIL/uL (ref 3.87–5.11)
RDW: 13 % (ref 11.5–15.5)
WBC: 11 10*3/uL — ABNORMAL HIGH (ref 4.0–10.5)
nRBC: 0 % (ref 0.0–0.2)

## 2021-01-25 LAB — BASIC METABOLIC PANEL
Anion gap: 8 (ref 5–15)
BUN: 17 mg/dL (ref 8–23)
CO2: 32 mmol/L (ref 22–32)
Calcium: 7.7 mg/dL — ABNORMAL LOW (ref 8.9–10.3)
Chloride: 93 mmol/L — ABNORMAL LOW (ref 98–111)
Creatinine, Ser: 0.69 mg/dL (ref 0.44–1.00)
GFR, Estimated: >60 mL/min (ref >60)
Glucose, Bld: 139 mg/dL — ABNORMAL HIGH (ref 70–99)
Potassium: 3.3 mmol/L — ABNORMAL LOW (ref 3.5–5.1)
Sodium: 133 mmol/L — ABNORMAL LOW (ref 135–145)

## 2021-01-25 LAB — GLUCOSE, CAPILLARY
Glucose-Capillary: 129 mg/dL — ABNORMAL HIGH (ref 70–99)
Glucose-Capillary: 134 mg/dL — ABNORMAL HIGH (ref 70–99)
Glucose-Capillary: 202 mg/dL — ABNORMAL HIGH (ref 70–99)
Glucose-Capillary: 204 mg/dL — ABNORMAL HIGH (ref 70–99)

## 2021-01-25 LAB — APTT: aPTT: 75 seconds — ABNORMAL HIGH (ref 24–36)

## 2021-01-25 LAB — HEPARIN LEVEL (UNFRACTIONATED): Heparin Unfractionated: >1.1 IU/mL — ABNORMAL HIGH (ref 0.30–0.70)

## 2021-01-25 SURGERY — RIGHT/LEFT HEART CATH AND CORONARY ANGIOGRAPHY
Anesthesia: Moderate Sedation

## 2021-01-25 MED ORDER — IOHEXOL 300 MG/ML  SOLN
INTRAMUSCULAR | Status: DC | PRN
  Administered 2021-01-25: 38 mL

## 2021-01-25 MED ORDER — FENTANYL CITRATE (PF) 100 MCG/2ML IJ SOLN
INTRAMUSCULAR | Status: AC
  Filled 2021-01-25: qty 2

## 2021-01-25 MED ORDER — LIDOCAINE HCL (PF) 1 % IJ SOLN
INTRAMUSCULAR | Status: DC | PRN
  Administered 2021-01-25 (×2): 2 mL

## 2021-01-25 MED ORDER — SODIUM CHLORIDE 0.9% FLUSH
3.0000 mL | Freq: Two times a day (BID) | INTRAVENOUS | Status: DC
  Administered 2021-01-25 – 2021-01-31 (×12): 3 mL via INTRAVENOUS

## 2021-01-25 MED ORDER — HEPARIN (PORCINE) IN NACL 1000-0.9 UT/500ML-% IV SOLN
INTRAVENOUS | Status: DC | PRN
  Administered 2021-01-25 (×2): 500 mL

## 2021-01-25 MED ORDER — POTASSIUM CHLORIDE CRYS ER 20 MEQ PO TBCR
40.0000 meq | EXTENDED_RELEASE_TABLET | Freq: Once | ORAL | Status: AC
  Administered 2021-01-25: 40 meq via ORAL
  Filled 2021-01-25: qty 2

## 2021-01-25 MED ORDER — LIDOCAINE HCL (PF) 1 % IJ SOLN
INTRAMUSCULAR | Status: AC
  Filled 2021-01-25: qty 30

## 2021-01-25 MED ORDER — MIDAZOLAM HCL 2 MG/2ML IJ SOLN
INTRAMUSCULAR | Status: DC | PRN
  Administered 2021-01-25 (×2): 1 mg via INTRAVENOUS

## 2021-01-25 MED ORDER — HEPARIN (PORCINE) 25000 UT/250ML-% IV SOLN
900.0000 [IU]/h | INTRAVENOUS | Status: DC
  Administered 2021-01-25: 800 [IU]/h via INTRAVENOUS
  Filled 2021-01-25: qty 250

## 2021-01-25 MED ORDER — LABETALOL HCL 5 MG/ML IV SOLN: 10.0000 mg | INTRAVENOUS | Status: AC | PRN

## 2021-01-25 MED ORDER — HEPARIN SODIUM (PORCINE) 1000 UNIT/ML IJ SOLN
INTRAMUSCULAR | Status: DC | PRN
  Administered 2021-01-25: 4500 [IU] via INTRAVENOUS

## 2021-01-25 MED ORDER — FENTANYL CITRATE (PF) 100 MCG/2ML IJ SOLN
INTRAMUSCULAR | Status: DC | PRN
  Administered 2021-01-25 (×2): 25 ug via INTRAVENOUS

## 2021-01-25 MED ORDER — HEPARIN (PORCINE) IN NACL 1000-0.9 UT/500ML-% IV SOLN
INTRAVENOUS | Status: AC
  Filled 2021-01-25: qty 1000

## 2021-01-25 MED ORDER — SODIUM CHLORIDE 0.9 % IV SOLN: 250.0000 mL | INTRAVENOUS | Status: DC | PRN

## 2021-01-25 MED ORDER — VERAPAMIL HCL 2.5 MG/ML IV SOLN
INTRAVENOUS | Status: DC | PRN
  Administered 2021-01-25: 2.5 mg via INTRA_ARTERIAL

## 2021-01-25 MED ORDER — HEPARIN SODIUM (PORCINE) 1000 UNIT/ML IJ SOLN
INTRAMUSCULAR | Status: AC
  Filled 2021-01-25: qty 1

## 2021-01-25 MED ORDER — VERAPAMIL HCL 2.5 MG/ML IV SOLN
INTRAVENOUS | Status: AC
  Filled 2021-01-25: qty 2

## 2021-01-25 MED ORDER — METOPROLOL TARTRATE 25 MG PO TABS
25.0000 mg | ORAL_TABLET | Freq: Four times a day (QID) | ORAL | Status: DC
  Administered 2021-01-25 – 2021-01-27 (×7): 25 mg via ORAL
  Filled 2021-01-25 (×5): qty 1

## 2021-01-25 MED ORDER — SODIUM CHLORIDE 0.9% FLUSH: 3.0000 mL | INTRAVENOUS | Status: DC | PRN

## 2021-01-25 MED ORDER — MIDAZOLAM HCL 2 MG/2ML IJ SOLN
INTRAMUSCULAR | Status: AC
  Filled 2021-01-25: qty 2

## 2021-01-25 MED ORDER — HYDRALAZINE HCL 20 MG/ML IJ SOLN: 10.0000 mg | INTRAMUSCULAR | Status: AC | PRN

## 2021-01-25 SURGICAL SUPPLY — 15 items
CATH BALLN WEDGE 5F 110CM (CATHETERS) ×1 IMPLANT
CATH INFINITI 5 FR JL3.5 (CATHETERS) ×1 IMPLANT
CATH INFINITI 5FR ANG PIGTAIL (CATHETERS) ×1 IMPLANT
CATH INFINITI JR4 5F (CATHETERS) ×1 IMPLANT
DEVICE RAD TR BAND REGULAR (VASCULAR PRODUCTS) ×1 IMPLANT
DRAPE BRACHIAL (DRAPES) ×2 IMPLANT
GLIDESHEATH SLEND SS 6F .021 (SHEATH) ×1 IMPLANT
GUIDEWIRE EMER 3M J .025X150CM (WIRE) ×1 IMPLANT
GUIDEWIRE INQWIRE 1.5J.035X260 (WIRE) IMPLANT
INQWIRE 1.5J .035X260CM (WIRE) ×2
PACK CARDIAC CATH (CUSTOM PROCEDURE TRAY) ×2 IMPLANT
PROTECTION STATION PRESSURIZED (MISCELLANEOUS) ×2
SET ATX SIMPLICITY (MISCELLANEOUS) ×1 IMPLANT
SHEATH GLIDE SLENDER 4/5FR (SHEATH) ×1 IMPLANT
STATION PROTECTION PRESSURIZED (MISCELLANEOUS) IMPLANT

## 2021-01-25 NOTE — Interval H&P Note (Signed)
History and Physical Interval Note:  01/25/2021 12:17 PM  Bonnie Rowland  has presented today for surgery, with the diagnosis of acute systolic dysfunction with elevated high-sensitivity troponin.  The various methods of treatment have been discussed with the patient and family. After consideration of risks, benefits and other options for treatment, the patient has consented to  Procedure(s): RIGHT/LEFT HEART CATH AND CORONARY ANGIOGRAPHY (N/A) as a surgical intervention.  The patient's history has been reviewed, patient examined, no change in status, stable for surgery.  I have reviewed the patient's chart and labs.  Questions were answered to the patient's satisfaction.    Cath Lab Visit (complete for each Cath Lab visit)  Clinical Evaluation Leading to the Procedure:   ACS: Yes.    Non-ACS:  N/A  Oather [place]

## 2021-01-25 NOTE — Progress Notes (Signed)
20 gauge ordered for right [facility] held, due to Dr End will place under [place] in cath lab for right heart cath. (Pt doesn't have easily accessed  AC iv at this time so deferred to Dr End) Discussed with cath lab staff.

## 2021-01-25 NOTE — Consult Note (Signed)
ANTICOAGULATION CONSULT NOTE - Follow Up Consult  Pharmacy Consult for Heparin gtt Indication: atrial fibrillation  Allergies  Allergen Reactions   Cyclobenzaprine Hypertension   Cheese Other (See Comments)    bloating   Ciprofloxacin Hcl Other (See Comments)    Muscle pain   Coconut Oil     Upset stomach   Keflex [Cephalexin] Other (See Comments)    Patient prefers not to take this medication due to the side effects, caused tendon issues    Patient Measurements: Height: '5\' 3"'$  (160 cm) Weight: 89 kg (196 lb 3.4 oz) IBW/kg (Calculated) : 52.4 Heparin Dosing Weight: 72.7 kg  Vital Signs: Temp: 98.5 F (36.9 C) (07/26 2053) BP: 126/78 (07/26 2053) Pulse Rate: 108 (07/26 2053)  Labs: Recent Labs    01/22/21 2321 01/22/21 2321 01/23/21 0120 01/23/21 0413 01/23/21 1722 01/24/21 0032 01/24/21 0419 01/24/21 1247 01/24/21 2209 01/25/21 0450  HGB 13.7  --   --   --   --   --  12.5  --   --  12.9  HCT 40.7  --   --   --   --   --  36.3  --   --  37.8  PLT 222  --   --   --   --   --  185  --   --  188  APTT  --    < >  --   --   --   --  38* 149* 72* 75*  HEPARINUNFRC  --   --   --   --   --   --  >1.10*  --   --  >1.10*  CREATININE  --   --   --  0.88  --   --  0.79  --   --  0.69  TROPONINIHS 10  --  87*  --  692* 481*  --   --   --   --    < > = values in this interval not displayed.     Estimated Creatinine Clearance: 61.3 mL/min (by C-G formula based on SCr of 0.69 mg/dL).   Medications:  Pt received 0.'5mg'$ /kg dose of lovenox (7/25 0756) and a '5mg'$  dose of eliquis (7/25 1655). 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 Pharmacy consulted for the conversion of Eliquis to Heparin gtt for mgmt of Afib.  Date Time aPTT/HL Rate/Comment 7/25  1655     ---   PM Dose of eliquis  '5mg'$   7/26 1247 149   7/26    2209     72 therapeutic x 1 @ 800 units/hr 7/27 0450  75 therapeutic x 2 @ 800 units/hr   Goal of Therapy:  Heparin level 0.3-0.7 units/ml aPTT 66-102s seconds Monitor platelets by anticoagulation protocol: Yes   Plan:  APTT is therapeutic x 2. Heparin level still elevated.  Will continue the heparin infusion at 800 units/hr.   Recheck aPTT/HL/CBC daily while on heparin.   Switch to heparin level once aPTT and heparin level correlate.    Lu Duffel, PharmD, BCPS Clinical Pharmacist 01/25/2021 5:34 AM

## 2021-01-25 NOTE — Brief Op Note (Signed)
BRIEF CARDIAC CATHETERIZATION NOTE  01/25/2021  1:25 PM  PATIENT:  Bonnie Rowland  80 y.o. female  PRE-OPERATIVE DIAGNOSIS:  Acute systolic dysfunction with elevated high-sensitivity troponin  POST-OPERATIVE DIAGNOSIS:  Same  PROCEDURE:  Procedure(s): RIGHT/LEFT HEART CATH AND CORONARY ANGIOGRAPHY (N/A)  SURGEON:  Surgeon(s) and Role:    * Zaydn Gutridge, MD - Primary  FINDINGS: Moderate, non-obstructive CAD involving LCx and RCA.  Mild LAD disease. Moderately reduced LVEF with mid anterior akinesis; query atypical Takotsubo variant. Mildly elevated left heart filling pressure. Moderately elevated right heart and pulmonary artery pressures. Mildly reduced Fick cardiac output/index.  RECOMMENDATIONS: Continue IV diuresis. Advance GDMT for cardiomyopathy, as tolerated. Increase metoprolol for rate control of a-fib. Restart IV heparin in 2 hours after TR band removal.  Transition back to apixaban tomorrow AM if no evidence of bleeding/vascular injury. Secondary prevention of CAD.  Nelva Bush, MD [facility]

## 2021-01-25 NOTE — Consult Note (Addendum)
ANTICOAGULATION CONSULT NOTE - Follow Up Consult  Pharmacy Consult for Heparin gtt Indication: atrial fibrillation  Allergies  Allergen Reactions   Cyclobenzaprine Hypertension   Cheese Other (See Comments)    bloating   Ciprofloxacin Hcl Other (See Comments)    Muscle pain   Coconut Oil     Upset stomach   Keflex [Cephalexin] Other (See Comments)    Patient prefers not to take this medication due to the side effects, caused tendon issues    Patient Measurements: Height: '5\' 3"'$  (160 cm) Weight: 87.9 kg (193 lb 12.6 oz) IBW/kg (Calculated) : 52.4 Heparin Dosing Weight: 72.7 kg  Vital Signs: Temp: 98.2 F (36.8 C) (07/27 1556) Temp Source: Oral (07/27 0400) BP: 127/65 (07/27 1556) Pulse Rate: 88 (07/27 1556)  Labs: Recent Labs    01/22/21 2321 01/22/21 2321 01/23/21 0120 01/23/21 0413 01/23/21 1722 01/24/21 0032 01/24/21 0419 01/24/21 1247 01/24/21 2209 01/25/21 0450  HGB 13.7  --   --   --   --   --  12.5  --   --  12.9  HCT 40.7  --   --   --   --   --  36.3  --   --  37.8  PLT 222  --   --   --   --   --  185  --   --  188  APTT  --    < >  --   --   --   --  38* 149* 72* 75*  HEPARINUNFRC  --   --   --   --   --   --  >1.10*  --   --  >1.10*  CREATININE  --   --   --  0.88  --   --  0.79  --   --  0.69  TROPONINIHS 10  --  87*  --  692* 481*  --   --   --   --    < > = values in this interval not displayed.     Estimated Creatinine Clearance: 60.9 mL/min (by C-G formula based on SCr of 0.69 mg/dL).   Medications:  Pt received 0.'5mg'$ /kg dose of lovenox (7/25 0756) and a '5mg'$  dose of eliquis (7/25 1655). 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 Pharmacy consulted for the conversion of Eliquis to Heparin gtt for mgmt of Afib.  Date Time aPTT/HL Rate/Comment 7/25  1655      ---   PM Dose of eliquis '5mg'$   7/26 1247 149   7/26    2209     72 therapeutic x 1 @ 800 units/hr 7/27 0450  75 therapeutic x 2 @ 800 units/hr 7/27 1027 STOPPED FOR PROCEDURE --resumed @ 1700    Goal of Therapy:  Heparin level 0.3-0.7 units/ml aPTT 66-102s seconds Monitor platelets by anticoagulation protocol: Yes   Plan:  RESUME heparin at previous therapeutic rate of 800 units/hr on 01/25/21 @ 1700 ( 2 hours following TR band removal per cardiology)  Will re-check confirmatory aPTT 8 hours following heparin infusion resuming.  HL/CBC daily while on heparin  Switch to heparin level once aPTT and heparin level correlate.  F/U transition back to [place], PharmD, BCPS Clinical Pharmacist 01/25/2021 3:58 PM

## 2021-01-25 NOTE — Progress Notes (Signed)
PT Cancellation Note  Patient Details Name: Bonnie Rowland MRN: [id number] DOB: 29-May-1941   Cancelled Treatment:    Reason Eval/Treat Not Completed: Medical issues which prohibited therapy (Pt off unit for cardiac procedure. WIll continue to follow remotely, evaluate once medically ready. Will need new PT order after procedure.)  1:15 PM, 01/25/21 Etta Grandchild, PT, DPT Physical Therapist - [facility]  [phone] ([place])    [place] C 01/25/2021, 1:15 PM

## 2021-01-25 NOTE — Progress Notes (Signed)
PROGRESS NOTE    Bonnie Rowland  [id number] DOB: [birth year] DOA: 01/23/2021 PCP: Steele Sizer, MD   Assessment & Plan:   Principal Problem:   Hypertensive emergency Active Problems:   AF (paroxysmal atrial fibrillation) (HCC)   Type 2 diabetes mellitus with hyperlipidemia (HCC)   Acute respiratory failure with hypoxia (HCC)   Hypertensive urgency   Acute on chronic combined systolic and diastolic CHF (congestive heart failure) (HCC)   Elevated troponin   Acute hypoxic respiratory failure: initially requiring BiPAP.  Pulse ox of 77% on 2 L in the ER.  Continue on supplemental oxygen and wean as tolerated  Hypertensive urgency: resolved but still w/ HTN. Continue on metoprolol, losartan.   Acute on chronic combined CHF: echo shows  EF 45 to 50%. Continue on metoprolol, losartan & IV lasix.    Elevated troponin: likely secondary to demand ischemia. 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 Continue on IV lasix & IV heparin. Will restart eliquis tomorrow as per cardio   PAF: had recent cardioversion and now in atrial fibrillation. Continue on amiodarone.  Holding eliquis and started on heparin drip for cardiac catheterization.  Hypokalemia: KCl repleated.  Hyponatremia: labile. Will continue to monitor   DM2: likely poorly controlled. Continue on lantus, SSI w/ accuchecks  HLD: continue on statin   Weakness: PT ordered  Lactic acidosis: on presentation.      DVT prophylaxis: IV heparin  Code Status: full  Family Communication:  Disposition Plan: depends on PT recs   Level of care: Progressive Cardiac  Status is: Inpatient  Remains inpatient appropriate because:Ongoing diagnostic testing needed not appropriate for outpatient work up, Unsafe d/c plan, IV treatments appropriate due to intensity of illness or inability to take PO, and Inpatient level of  care appropriate due to severity of illness  Dispo: The patient is from: Home              Anticipated d/c is to: Home              Patient currently is not medically stable to d/c.   Difficult to place patient: unclear    Consultants:  Cardio   Procedures:   Antimicrobials:   Subjective: Pt c/o fatigue  Objective: Vitals:   01/24/21 2053 01/25/21 0400 01/25/21 0500 01/25/21 0747  BP: 126/78 126/76  122/65  Pulse: (!) 108 84  63  Resp: 15   16  Temp: 98.5 F (36.9 C) 98.2 F (36.8 C)  98.3 F (36.8 C)  TempSrc:  Oral    SpO2: 96% 96%  96%  Weight:   87.9 kg   Height:        Intake/Output Summary (Last 24 hours) at 01/25/2021 0753 Last data filed at 01/24/2021 2100 Gross per 24 hour  Intake 624.18 ml  Output 4275 ml  Net -3650.82 ml   Filed Weights   01/24/21 0100 01/25/21 0500  Weight: 89 kg 87.9 kg    Examination:  General exam: Appears calm and comfortable  Respiratory system: diminished breath sounds b/l  Cardiovascular system: irregularly irregular. No rubs, gallops or clicks.  Gastrointestinal system: Abdomen is nondistended, soft and nontender. Normal bowel sounds heard. Central nervous system: Alert and oriented. Moves all extremities  Psychiatry: Judgement and insight appear normal. Flat mood and affect    Data Reviewed: I have personally reviewed following labs and imaging studies  CBC: Recent Labs  Lab  01/22/21 2321 01/24/21 0419 01/25/21 0450  WBC 11.3* 15.8* 11.0*  HGB 13.7 12.5 12.9  HCT 40.7 36.3 37.8  MCV 95.8 95.3 94.5  PLT 222 185 0000000   Basic Metabolic Panel: Recent Labs  Lab 01/23/21 0413 01/24/21 0419 01/25/21 0450  NA 134* 132* 133*  K 4.3 3.8 3.3*  CL 96* 95* 93*  CO2 28 30 32  GLUCOSE 284* 158* 139*  BUN '21 21 17  '$ CREATININE 0.88 0.79 0.69  CALCIUM 9.0 7.8* 7.7*   GFR: Estimated Creatinine Clearance: 60.9 mL/min (by C-G formula based on SCr of 0.69 mg/dL). Liver Function Tests: Recent Labs  Lab  01/23/21 0413  AST 42*  ALT 34  ALKPHOS 81  BILITOT 1.6*  PROT 7.1  ALBUMIN 4.0   No results for input(s): LIPASE, AMYLASE in the last 168 hours. No results for input(s): AMMONIA in the last 168 hours. Coagulation Profile: No results for input(s): INR, PROTIME in the last 168 hours. Cardiac Enzymes: No results for input(s): CKTOTAL, CKMB, CKMBINDEX, TROPONINI in the last 168 hours. BNP (last 3 results) No results for input(s): PROBNP in the last 8760 hours. HbA1C: No results for input(s): HGBA1C in the last 72 hours. CBG: Recent Labs  Lab 01/24/21 0754 01/24/21 1151 01/24/21 1813 01/24/21 1957 01/25/21 0748  GLUCAP 150* 142* 172* 191* 129*   Lipid Profile: No results for input(s): CHOL, HDL, LDLCALC, TRIG, CHOLHDL, LDLDIRECT in the last 72 hours. Thyroid Function Tests: No results for input(s): TSH, T4TOTAL, FREET4, T3FREE, THYROIDAB in the last 72 hours. Anemia Panel: No results for input(s): VITAMINB12, FOLATE, FERRITIN, TIBC, IRON, RETICCTPCT in the last 72 hours. Sepsis Labs: Recent Labs  Lab 01/23/21 0413 01/23/21 0600 01/24/21 0419  PROCALCITON <0.10  --  0.21  LATICACIDVEN 3.0* 3.0*  --     Recent Results (from the past 240 hour(s))  SARS CORONAVIRUS 2 (TAT 6-24 HRS) Nasopharyngeal Nasopharyngeal Swab     Status: None   Collection Time: 01/23/21 12:43 AM   Specimen: Nasopharyngeal Swab  Result Value Ref Range Status   SARS Coronavirus 2 NEGATIVE NEGATIVE Final    Comment: (NOTE) SARS-CoV-2 target nucleic acids are NOT DETECTED.  The SARS-CoV-2 RNA is generally detectable in upper and lower respiratory specimens during the acute phase of infection. Negative results do not preclude SARS-CoV-2 infection, do not rule out co-infections with other pathogens, and should not be used as the sole basis for treatment or other patient management decisions. Negative results must be combined with clinical observations, patient history, and epidemiological  information. The expected result is Negative.  Fact Sheet for Patients: SugarRoll.be  Fact Sheet for Healthcare Providers: https://www.woods-mathews.com/  This test is not yet approved or cleared by the [place] FDA and  has been authorized for detection and/or diagnosis of SARS-CoV-2 by FDA under an Emergency Use Authorization (EUA). This EUA will remain  in effect (meaning this test can be used) for the duration of the COVID-19 declaration under Se ction 564(b)(1) of the Act, 21 U.S.C. section 360bbb-3(b)(1), unless the authorization is terminated or revoked sooner.  Performed at [facility], [place] [address] [place], [place] [address]   Culture, blood (routine x 2)     Status: None (Preliminary result)   Collection Time: 01/23/21  4:02 AM   Specimen: BLOOD  Result Value Ref Range Status   Specimen Description BLOOD LEFT [facility]  Final   Special Requests   Final    BOTTLES DRAWN AEROBIC AND ANAEROBIC Blood Culture adequate  volume   Culture   Final    NO GROWTH 1 DAY Performed at [facility], [address] [place], [place] [address]    Report Status PENDING  Incomplete  Culture, blood (routine x 2)     Status: None (Preliminary result)   Collection Time: 01/23/21  4:13 AM   Specimen: BLOOD  Result Value Ref Range Status   Specimen Description BLOOD LEFT HAND  Final   Special Requests   Final    BOTTLES DRAWN AEROBIC AND ANAEROBIC Blood Culture adequate volume   Culture   Final    NO GROWTH 1 DAY Performed at [facility], [address] [place], [place] [address]    Report Status PENDING  Incomplete         Radiology Studies: ECHOCARDIOGRAM COMPLETE  Result Date: 01/23/2021    ECHOCARDIOGRAM REPORT   Patient Name:   TYIESHA FILHO [facility] Date of Exam: 01/23/2021 Medical Rec #:  [id number]          Height:       63.0 in Accession #:    [id number]         Weight:       197.1 lb Date of Birth:   1941/04/07          BSA:          1.922 m Patient Age:    80 years           BP:           131/71 mmHg Patient Gender: F                  HR:           93 bpm. Exam Location:  [facility] Procedure: 2D Echo, Cardiac Doppler and Color Doppler Indications:     CHF-acute diastolic [id number]  History:         Patient has prior history of Echocardiogram examinations, most                  recent 10/20/2020. Risk Factors:Hypertension.  Sonographer:     Sherrie Sport RDCS (AE) Referring Phys:  [id number] Athena Masse Diagnosing Phys: Nelva Bush MD  Sonographer Comments: Technically challenging study due to limited acoustic windows, no apical window and no subcostal window. Pt on bipap---could not lie on left side due to pain. IMPRESSIONS  1. Left ventricular ejection fraction, by estimation, is 45 to 50%. The left ventricle has mildly decreased function. The left ventricle demonstrates regional wall motion abnormalities (see scoring diagram/findings for description). There is mild left ventricular hypertrophy. Left ventricular diastolic function could not be evaluated. There is severe hypokinesis of the left ventricular, entire anterior wall and anteroseptal wall.  2. Right ventricular systolic function is moderately reduced. The right ventricular size is normal. Mildly increased right ventricular wall thickness.  3. The mitral valve is degenerative. Trivial mitral valve regurgitation.  4. The aortic valve is tricuspid. Aortic valve regurgitation not well assessed. FINDINGS  Left Ventricle: Left ventricular ejection fraction, by estimation, is 45 to 50%. The left ventricle has mildly decreased function. The left ventricle demonstrates regional wall motion abnormalities. Severe hypokinesis of the left ventricular, entire anterior wall and anteroseptal wall. The left ventricular internal cavity size was normal in size. There is mild left ventricular hypertrophy. Left ventricular diastolic function could not be evaluated. Right  Ventricle: The right ventricular size is normal. Mildly increased right ventricular wall thickness. Right ventricular systolic function is moderately reduced.  Left Atrium: Left atrial size was not well visualized. Right Atrium: Right atrial size was not well visualized. Pericardium: Trivial pericardial effusion is present. Mitral Valve: The mitral valve is degenerative in appearance. There is mild thickening of the mitral valve leaflet(s). Mild mitral annular calcification. Trivial mitral valve regurgitation. Tricuspid Valve: The tricuspid valve is not well visualized. Tricuspid valve regurgitation is trivial. Aortic Valve: The aortic valve is tricuspid. Aortic valve regurgitation not well assessed. Pulmonic Valve: The pulmonic valve was not well visualized. Aorta: The aortic root is normal in size and structure. Pulmonary Artery: The pulmonary artery is not well seen. IAS/Shunts: The interatrial septum was not assessed. Additional Comments: There is pleural effusion in the left lateral region.  LEFT VENTRICLE PLAX 2D LVIDd:         3.74 cm LVIDs:         2.89 cm LV PW:         1.35 cm LV IVS:        1.13 cm LVOT diam:     2.00 cm LVOT Area:     3.14 cm  LEFT ATRIUM         Index LA diam:    4.00 cm 2.08 cm/m                        PULMONIC VALVE AORTA                 PV Vmax:        0.55 m/s Ao Root diam: 2.50 cm PV Peak grad:   1.2 mmHg                       RVOT Peak grad: 2 mmHg   SHUNTS Systemic Diam: 2.00 cm Nelva Bush MD Electronically signed by Nelva Bush MD Signature Date/Time: 01/23/2021/4:59:35 PM    Final         Scheduled Meds:  acidophilus  1 capsule Oral QHS   amiodarone  400 mg Oral BID   aspirin EC  81 mg Oral Daily   furosemide  40 mg Intravenous BID   insulin aspart  0-15 Units Subcutaneous TID WC   insulin aspart  0-5 Units Subcutaneous QHS   insulin glargine  15 Units Subcutaneous QHS   losartan  12.5 mg Oral Daily   metoprolol tartrate  12.5 mg Oral Q6H    multivitamin-lutein  1 capsule Oral BID   rosuvastatin  10 mg Oral QHS   sodium chloride flush  3 mL Intravenous Q12H   tiZANidine  2 mg Oral QHS   Continuous Infusions:  sodium chloride     sodium chloride     heparin 800 Units/hr (01/24/21 1751)     LOS: 2 days    Time spent: 31 mins    Wyvonnia Dusky, MD [facility] Pager [phone]  If 7PM-7AM, please contact night-coverage 01/25/2021, 7:53 AM

## 2021-01-25 NOTE — Progress Notes (Signed)
Progress Note  Patient Name: Bonnie Rowland Date of Encounter: 01/25/2021  Primary Cardiologist: Rockey Situ  Subjective   Dyspnea continues to improve, though not back to baseline.  No chest pain or palpitations.  Lower extremity swelling improved.  Orthopnea persists, though is improving.  Documented urine output 3.6 L for the past 24 hours with net - 4.7 L for the admission, though she has reported vigorous urine output in the ED that was not recorded.  Weight 89 to 87.9 kg over the past 24 hours.  Renal function is stable.  She is for [facility] today.  Inpatient Medications    Scheduled Meds:  acidophilus  1 capsule Oral QHS   amiodarone  400 mg Oral BID   aspirin EC  81 mg Oral Daily   furosemide  40 mg Intravenous BID   insulin aspart  0-15 Units Subcutaneous TID WC   insulin aspart  0-5 Units Subcutaneous QHS   insulin glargine  15 Units Subcutaneous QHS   losartan  12.5 mg Oral Daily   metoprolol tartrate  12.5 mg Oral Q6H   multivitamin-lutein  1 capsule Oral BID   rosuvastatin  10 mg Oral QHS   sodium chloride flush  3 mL Intravenous Q12H   tiZANidine  2 mg Oral QHS   Continuous Infusions:  sodium chloride     sodium chloride     heparin 800 Units/hr (01/24/21 1751)   PRN Meds: sodium chloride, acetaminophen **OR** acetaminophen, fluticasone, ondansetron **OR** ondansetron (ZOFRAN) IV, polyvinyl alcohol, sodium chloride flush   Vital Signs    Vitals:   01/24/21 2053 01/25/21 0400 01/25/21 0500 01/25/21 0747  BP: 126/78 126/76  122/65  Pulse: (!) 108 84  63  Resp: 15   16  Temp: 98.5 F (36.9 C) 98.2 F (36.8 C)  98.3 F (36.8 C)  TempSrc:  Oral    SpO2: 96% 96%  96%  Weight:   87.9 kg   Height:        Intake/Output Summary (Last 24 hours) at 01/25/2021 0949 Last data filed at 01/24/2021 2100 Gross per 24 hour  Intake 384.18 ml  Output 3575 ml  Net -3190.82 ml    Filed Weights   01/24/21 0100 01/25/21 0500  Weight: 89 kg 87.9 kg    Telemetry     Afib, low 100s bpm - Personally Reviewed  ECG    No new tracings - Personally Reviewed  Physical Exam   GEN: No acute distress.   Neck: JVD elevated at approximately 8 cm. Cardiac: IRIR, II/VI systolic murmur, no rubs, or gallops.  Respiratory: Crackles along the bilateral bases.  GI: Soft, nontender, non-distended.   MS: Trace bilateral pretibial edema ; No deformity. Neuro:  Alert and oriented x 3; Nonfocal.  Psych: Normal affect.  Labs    Chemistry Recent Labs  Lab 01/23/21 0413 01/24/21 0419 01/25/21 0450  NA 134* 132* 133*  K 4.3 3.8 3.3*  CL 96* 95* 93*  CO2 28 30 32  GLUCOSE 284* 158* 139*  BUN '21 21 17  '$ CREATININE 0.88 0.79 0.69  CALCIUM 9.0 7.8* 7.7*  PROT 7.1  --   --   ALBUMIN 4.0  --   --   AST 42*  --   --   ALT 34  --   --   ALKPHOS 81  --   --   BILITOT 1.6*  --   --   GFRNONAA >60 >60 >60  ANIONGAP 10 7 8  Hematology Recent Labs  Lab 01/22/21 2321 01/24/21 0419 01/25/21 0450  WBC 11.3* 15.8* 11.0*  RBC 4.25 3.81* 4.00  HGB 13.7 12.5 12.9  HCT 40.7 36.3 37.8  MCV 95.8 95.3 94.5  MCH 32.2 32.8 32.3  MCHC 33.7 34.4 34.1  RDW 13.2 13.3 13.0  PLT 222 185 188     Cardiac EnzymesNo results for input(s): TROPONINI in the last 168 hours. No results for input(s): TROPIPOC in the last 168 hours.   BNP Recent Labs  Lab 01/22/21 2321  BNP 237.3*      DDimer No results for input(s): DDIMER in the last 168 hours.   Radiology    DG Chest 2 View  Result Date: 01/23/2021 IMPRESSION: 1. Mild bibasilar atelectasis and/or infiltrate. 2. A very mild component of superimposed interstitial edema cannot be excluded. Electronically Signed   By: Virgina Norfolk M.D.   On: 01/23/2021 00:05   DG Chest Port 1 View  Result Date: 01/23/2021 IMPRESSION: Moderate to marked severity bilateral infiltrates, increased in severity when compared to the prior study. Electronically Signed   By: Virgina Norfolk M.D.   On: 01/23/2021 03:17   Cardiac  Studies   2D echo 01/23/2021: 1. Left ventricular ejection fraction, by estimation, is 45 to 50%. The  left ventricle has mildly decreased function. The left ventricle  demonstrates regional wall motion abnormalities (see scoring  diagram/findings for description). There is mild left  ventricular hypertrophy. Left ventricular diastolic function could not be  evaluated. There is severe hypokinesis of the left ventricular, entire  anterior wall and anteroseptal wall.   2. Right ventricular systolic function is moderately reduced. The right  ventricular size is normal. Mildly increased right ventricular wall  thickness.   3. The mitral valve is degenerative. Trivial mitral valve regurgitation.   4. The aortic valve is tricuspid. Aortic valve regurgitation not well  assessed.  __________  2D echo 09/2020: 1. Left ventricular ejection fraction, by estimation, is 60 to 65%. The  left ventricle has normal function. The left ventricle has no regional  wall motion abnormalities. Left ventricular diastolic parameters are  indeterminate.   2. Right ventricular systolic function is normal. The right ventricular  size is normal. There is normal pulmonary artery systolic pressure.   3. Left atrial size was mildly dilated.   4. The mitral valve is normal in structure. Mild mitral valve  regurgitation.  Patient Profile     80 y.o. female with history of normal coronary arteries by LHC in 09/2005, persistent A. Fib status post DCCV on 11/30/2020 with recurrent Afib s/p repeat DCCV on amiodarone 01/20/2021, postoperative atrial flutter in 01/2015, less than 50% left renal artery stenosis by imaging in 2021, 1 to 39% bilateral ICA carotid stenosis by imaging in 2017, DM2, HTN, HLD, and obesity who is being seen today for the evaluation of acute hypoxic respiratory failure at the request of Dr. Damita Dunnings.  Assessment & Plan    1.  Acute hypoxic respiratory failure: -Improving -Continue IV Lasix 40 mg bid with  KCl repletion  -Cannot exclude some degree of flash pulmonary edema -Cannot exclude possible early case of amiodarone toxicity, though for now, would continue amiodarone with plans to repeat CXR following diuresis and if symptoms/imaging are unchanged at that time, may need to consider stopping amiodarone -Wean supplemental oxygen as able -Plan for [facility] as outlined below   2. Hypertensive urgency with RAS: -Prior renal artery ultrasound in 08/2019 showed < 50% left RAS -BP well controlled -Continue  current therapy including Lopressor, losartan, and IV Lasix   3.  Elevated troponin/acute systolic dysfunction with acute on chronic HFpEF: -Volume status is improving, RHC to further gauge hemodynamic status to assist with ongoing diuresis -No chest pain -Mildly elevated high-sensitivity troponin peaking at 692 and subsequently down trending possibly in the setting of supply demand ischemia with hypertensive urgency and volume overload.  However, cannot exclude ischemic component given mild decrease in LVSF with associated WMA versus stress-induced cardiomyopathy -Heparin drip as below -IV Lasix as above -N.p.o. -R/LHC today -Losartan -Lopressor -Risks and benefits of cardiac catheterization have been discussed with the patient including risks of bleeding, bruising, infection, kidney damage, stroke, heart attack, urgent need for bypass, injury to limb, and death. The patient understands these risks and is willing to proceed with the procedure. All questions have been answered and concerns listened to -Strict I/O -Daily standing scale weights   4. Persistent Afib/flutter: -Status post recent briefly successful DCCV in 11/2020 with repeat briefly successful DCCV on amiodarone 01/20/2021 -Initially, in the ED, she was having paroxysms of A. fib with intermittent conversion to sinus rhythm -She remains in A. fib since approximately 1:48 AM on 7/26 -Continue to reloaded with oral amiodarone 400 mg  twice daily -CHA2DS2-VASc at least 6 (CHF, HTN, age x2, DM2, sex category) -Continue heparin drip while admitted until it is clear no further invasive procedures are needed, last dose of Eliquis was approximately 5 PM on 7/25  5.  HLD: -LDL 38 in 07/2020 -Crestor  6.  Hypokalemia: -Repletion has been ordered   For questions or updates, please contact [place] Please consult [email or web address] for contact info under Cardiology/STEMI.    Signed, [name], [name] [facility] Pager: [phone] 01/25/2021, 9:49 AM

## 2021-01-26 ENCOUNTER — Encounter: Payer: Self-pay | Admitting: Internal Medicine

## 2021-01-26 DIAGNOSIS — E876 Hypokalemia: Secondary | ICD-10-CM | POA: Diagnosis not present

## 2021-01-26 DIAGNOSIS — I5043 Acute on chronic combined systolic (congestive) and diastolic (congestive) heart failure: Secondary | ICD-10-CM | POA: Diagnosis not present

## 2021-01-26 DIAGNOSIS — J9601 Acute respiratory failure with hypoxia: Secondary | ICD-10-CM

## 2021-01-26 DIAGNOSIS — J810 Acute pulmonary edema: Secondary | ICD-10-CM | POA: Diagnosis not present

## 2021-01-26 DIAGNOSIS — E1169 Type 2 diabetes mellitus with other specified complication: Secondary | ICD-10-CM

## 2021-01-26 DIAGNOSIS — R0902 Hypoxemia: Secondary | ICD-10-CM

## 2021-01-26 DIAGNOSIS — R0602 Shortness of breath: Secondary | ICD-10-CM

## 2021-01-26 DIAGNOSIS — E785 Hyperlipidemia, unspecified: Secondary | ICD-10-CM

## 2021-01-26 DIAGNOSIS — I160 Hypertensive urgency: Secondary | ICD-10-CM

## 2021-01-26 DIAGNOSIS — I480 Paroxysmal atrial fibrillation: Secondary | ICD-10-CM | POA: Diagnosis not present

## 2021-01-26 LAB — APTT
aPTT: 68 seconds — ABNORMAL HIGH (ref 24–36)
aPTT: 58 seconds — ABNORMAL HIGH (ref 24–36)
aPTT: 62 seconds — ABNORMAL HIGH (ref 24–36)

## 2021-01-26 LAB — CBC
HCT: 37.7 % (ref 36.0–46.0)
Hemoglobin: 12.9 g/dL (ref 12.0–15.0)
MCH: 32.3 pg (ref 26.0–34.0)
MCHC: 34.2 g/dL (ref 30.0–36.0)
MCV: 94.5 fL (ref 80.0–100.0)
Platelets: 189 10*3/uL (ref 150–400)
RBC: 3.99 MIL/uL (ref 3.87–5.11)
RDW: 13 % (ref 11.5–15.5)
WBC: 11.2 10*3/uL — ABNORMAL HIGH (ref 4.0–10.5)
nRBC: 0 % (ref 0.0–0.2)

## 2021-01-26 LAB — BASIC METABOLIC PANEL
Anion gap: 4 — ABNORMAL LOW (ref 5–15)
BUN: 19 mg/dL (ref 8–23)
CO2: 36 mmol/L — ABNORMAL HIGH (ref 22–32)
Calcium: 7.8 mg/dL — ABNORMAL LOW (ref 8.9–10.3)
Chloride: 94 mmol/L — ABNORMAL LOW (ref 98–111)
Creatinine, Ser: 0.81 mg/dL (ref 0.44–1.00)
GFR, Estimated: >60 mL/min (ref >60)
Glucose, Bld: 199 mg/dL — ABNORMAL HIGH (ref 70–99)
Potassium: 3.4 mmol/L — ABNORMAL LOW (ref 3.5–5.1)
Sodium: 134 mmol/L — ABNORMAL LOW (ref 135–145)

## 2021-01-26 LAB — GLUCOSE, CAPILLARY
Glucose-Capillary: 172 mg/dL — ABNORMAL HIGH (ref 70–99)
Glucose-Capillary: 208 mg/dL — ABNORMAL HIGH (ref 70–99)
Glucose-Capillary: 182 mg/dL — ABNORMAL HIGH (ref 70–99)
Glucose-Capillary: 266 mg/dL — ABNORMAL HIGH (ref 70–99)

## 2021-01-26 LAB — HEPARIN LEVEL (UNFRACTIONATED): Heparin Unfractionated: 0.72 IU/mL — ABNORMAL HIGH (ref 0.30–0.70)

## 2021-01-26 MED ORDER — HEPARIN (PORCINE) 25000 UT/250ML-% IV SOLN: 900.0000 [IU]/h | INTRAVENOUS | Status: AC

## 2021-01-26 MED ORDER — APIXABAN 5 MG PO TABS
5.0000 mg | ORAL_TABLET | Freq: Two times a day (BID) | ORAL | Status: DC
Start: 2021-01-26 — End: 2021-01-31
  Administered 2021-01-26 – 2021-01-31 (×10): 5 mg via ORAL
  Filled 2021-01-26 (×10): qty 1

## 2021-01-26 MED ORDER — POTASSIUM CHLORIDE CRYS ER 20 MEQ PO TBCR
20.0000 meq | EXTENDED_RELEASE_TABLET | Freq: Once | ORAL | Status: AC
  Administered 2021-01-26: 20 meq via ORAL
  Filled 2021-01-26: qty 1

## 2021-01-26 NOTE — Consult Note (Signed)
ANTICOAGULATION CONSULT NOTE - Follow Up Consult  Pharmacy Consult for Heparin gtt Indication: atrial fibrillation  Allergies  Allergen Reactions   Cyclobenzaprine Hypertension   Cheese Other (See Comments)    bloating   Ciprofloxacin Hcl Other (See Comments)    Muscle pain   Coconut Oil     Upset stomach   Keflex [Cephalexin] Other (See Comments)    Patient prefers not to take this medication due to the side effects, caused tendon issues    Patient Measurements: Height: '5\' 3"'$  (160 cm) Weight: 87.3 kg (192 lb 6.4 oz) IBW/kg (Calculated) : 52.4 Heparin Dosing Weight: 72.7 kg  Vital Signs: Temp: 98.1 F (36.7 C) (07/28 0754) Temp Source: Oral (07/28 0754) BP: 119/68 (07/28 0754) Pulse Rate: 81 (07/28 0754)  Labs: Recent Labs     0000 01/23/21 1722 01/24/21 0032 01/24/21 0419 01/24/21 1247 01/25/21 0450 01/26/21 0112 01/26/21 0952  HGB   < >  --   --  12.5  --  12.9 12.9  --   HCT  --   --   --  36.3  --  37.8 37.7  --   PLT  --   --   --  185  --  188 189  --   APTT  --   --   --  38*   < > 75* 68* 58*  HEPARINUNFRC  --   --   --  >1.10*  --  >1.10*  --  0.72*  CREATININE  --   --   --  0.79  --  0.69 0.81  --   TROPONINIHS  --  692* 481*  --   --   --   --   --    < > = values in this interval not displayed.     Estimated Creatinine Clearance: 60 mL/min (by C-G formula based on SCr of 0.81 mg/dL).   Medications:  Pt received 0.'5mg'$ /kg dose of lovenox (7/25 0756) and a '5mg'$  dose of eliquis (7/25 1655). 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 Pharmacy consulted for the conversion of Eliquis to Heparin gtt for mgmt of Afib.  Date Time aPTT/HL Rate/Comment 7/25  1655     ---   PM Dose of eliquis '5mg'$   7/26 1247 149   7/26    2209     72 therapeutic x 1 @ 800  units/hr 7/27 0450  75 therapeutic x 2 @ 800 units/hr 7/27 1027 STOPPED FOR PROCEDURE --resumed @ 1700  7/28 0112  68 therapeutic x 1 7/28 0952 aPTT 58 subtherapeutic and HL 0.72    Goal of Therapy:  Heparin level 0.3-0.7 units/ml aPTT 66-102s seconds Monitor platelets by anticoagulation protocol: Yes   Plan:  aPTT is slightly subtherapeutic. Will increase heparin infusion to 900 units/hr. Recheck aPTT in 8 hours. CBC daily while on heparin. Switch to heparin level once aPTT and heparin level are correlating.    Oswald Hillock, PharmD, BCPS Clinical Pharmacist 01/26/2021 10:27 AM

## 2021-01-26 NOTE — Consult Note (Addendum)
   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  Hypokinesis of the left ventricle entire anterior wall and anterior septal wall.  Right ventricular systolic function is moderately reduced  Comorbidities:  Paroxysmal atrial fibrillation Type 2 diabetes Hyperlipidemia Hypertension   Medications:  Amiodarone 400 mg 2 times a day Aspirin 81 mg daily Furosemide 40 mg IV twice daily Losartan 12 and half milligrams daily Metoprolol tart Tate 25 mg every 6 hours Rosuvastatin 10 mg at bedtime  Labs:  Sodium 134, potassium 3.4, chloride 94, CO2 36, BUN 19, creatinine 0.8 Intake 724 mL Output 2250 mL Weight is 87.3 kg down from 87.9 of yesterday.   Assessment:  Bonnie Rowland is awake and alert lying in bed, son is at the bedside.  HEENT-pupils are equal, normocephalic   Cardiac-heart tones are irregular.  Chest-breath sounds are diminished  Abdomen-soft nontender  Lower extremities-there is no pitting edema noted.  Psych-is pleasant and appropriate makes good eye contact  Neurologic-speech is clear.     Initial meeting with patient and her son.  Discussed low-sodium diet and not using salt at the table.  She states that this time the only thing that she puts a little bit of salt on is when she has a salad.  Also talked about reading labels avoid eating foods that sodium content per serving is greater than 170 mg.  Discussed daily weights and what to report to physician.   Also talked about follow-up in the heart failure clinic once she is discharged from the hospital.   She was given the living with heart failure teaching booklet, zone magnet and low-sodium information.    We will continue to follow along, they had no further questions.   Pricilla Riffle RN [facility]

## 2021-01-26 NOTE — Progress Notes (Signed)
Progress Note  Patient Name: Bonnie Rowland Date of Encounter: 01/26/2021  [facility] Cardiologist: Ida Rogue, MD   Subjective   Reports slow improvement in her respiratory status, On presentation needed BiPAP then transition to nasal cannula oxygen Now resting in bed on room air, with movement mild desaturations to 90% Still feels abdomen fullness consistent with fluid retention High fluid intake at home, salt  Inpatient Medications    Scheduled Meds:  acidophilus  1 capsule Oral QHS   amiodarone  400 mg Oral BID   aspirin EC  81 mg Oral Daily   furosemide  40 mg Intravenous BID   insulin aspart  0-15 Units Subcutaneous TID WC   insulin aspart  0-5 Units Subcutaneous QHS   insulin glargine  15 Units Subcutaneous QHS   losartan  12.5 mg Oral Daily   metoprolol tartrate  25 mg Oral Q6H   multivitamin-lutein  1 capsule Oral BID   rosuvastatin  10 mg Oral QHS   sodium chloride flush  3 mL Intravenous Q12H   tiZANidine  2 mg Oral QHS   Continuous Infusions:  sodium chloride     heparin 900 Units/hr (01/26/21 1104)   PRN Meds: sodium chloride, acetaminophen **OR** acetaminophen, fluticasone, ondansetron **OR** ondansetron (ZOFRAN) IV, polyvinyl alcohol, sodium chloride flush   Vital Signs    Vitals:   01/26/21 0500 01/26/21 0754 01/26/21 1132 01/26/21 1602  BP:  119/68 114/63 122/72  Pulse:  81 88 87  Resp:  '18 14 18  '$ Temp:  98.1 F (36.7 C) 98.2 F (36.8 C) 97.9 F (36.6 C)  TempSrc:  Oral  Oral  SpO2:  97% 94% 95%  Weight: 87.3 kg     Height:        Intake/Output Summary (Last 24 hours) at 01/26/2021 1813 Last data filed at 01/26/2021 1345 Gross per 24 hour  Intake 964.59 ml  Output 1900 ml  Net -935.41 ml   Last 3 Weights 01/26/2021 01/25/2021 01/24/2021  Weight (lbs) 192 lb 6.4 oz 193 lb 12.6 oz 196 lb 3.4 oz  Weight (kg) 87.272 kg 87.9 kg 89 kg      Telemetry    Atrial fibrillation rate in the 80s- Personally Reviewed  ECG    -  Personally Reviewed  Physical Exam   GEN: No acute distress.   Neck: Unable to estimate JVD Cardiac: Irregularly irregular no murmurs, rubs, or gallops.  Respiratory: Clear to auscultation bilaterally. GI: Soft, nontender, non-distended  MS: No edema; No deformity. Neuro:  Nonfocal  Psych: Normal affect   Labs    High Sensitivity Troponin:   Recent Labs  Lab 01/22/21 2321 01/23/21 0120 01/23/21 1722 01/24/21 0032  TROPONINIHS 10 87* 692* 481*      Chemistry Recent Labs  Lab 01/23/21 0413 01/24/21 0419 01/25/21 0450 01/26/21 0112  NA 134* 132* 133* 134*  K 4.3 3.8 3.3* 3.4*  CL 96* 95* 93* 94*  CO2 28 30 32 36*  GLUCOSE 284* 158* 139* 199*  BUN '21 21 17 19  '$ CREATININE 0.88 0.79 0.69 0.81  CALCIUM 9.0 7.8* 7.7* 7.8*  PROT 7.1  --   --   --   ALBUMIN 4.0  --   --   --   AST 42*  --   --   --   ALT 34  --   --   --   ALKPHOS 81  --   --   --   BILITOT 1.6*  --   --   --  GFRNONAA >60 >60 >60 >60  ANIONGAP '10 7 8 '$ 4*     Hematology Recent Labs  Lab 01/24/21 0419 01/25/21 0450 01/26/21 0112  WBC 15.8* 11.0* 11.2*  RBC 3.81* 4.00 3.99  HGB 12.5 12.9 12.9  HCT 36.3 37.8 37.7  MCV 95.3 94.5 94.5  MCH 32.8 32.3 32.3  MCHC 34.4 34.1 34.2  RDW 13.3 13.0 13.0  PLT 185 188 189    BNP Recent Labs  Lab 01/22/21 2321  BNP 237.3*     DDimer No results for input(s): DDIMER in the last 168 hours.   Radiology    CARDIAC CATHETERIZATION  Result Date: 01/25/2021 Conclusions: Mild to moderate, non-obstructive coronary artery disease, including 30% mid LAD disease, 50-60% mid/distal LCx stenosis, and 60% proximal/mid RCA lesion. Mildly to moderately reduced left ventricular systolic function with mid anterior hypo/akinesis; query atypical Takotsubo variant.  LVEF ~45%. Mildly elevated left heart filling pressures. Moderately elevated right heart filling and pulmonary artery pressures with significant pulmonary vascular resistance. Mildly reduced Fick cardiac  output/index. Recommendations: Continue IV diuresis. Advance goal-directed medical therapy for cardiomyopathy, as tolerated.  Patient may benefit from outpatient advanced heart failure consultation. Increase metoprolol for rate control of atrial fibrillation. Restart IV heparin in 2 hours after TR band removal.  Transition back to apixaban tomorrow AM if no evidence of bleeding/vascular injury. Secondary prevention of coronary artery disease. Nelva Bush, MD [facility]   Cardiac Studies  Cardiac catheterization performed January 25, 2021 Mild to moderate, non-obstructive coronary artery disease, including 30% mid LAD disease, 50-60% mid/distal LCx stenosis, and 60% proximal/mid RCA lesion. Mildly to moderately reduced left ventricular systolic function with mid anterior hypo/akinesis; query atypical Takotsubo variant.  LVEF ~45%. Mildly elevated left heart filling pressures. Moderately elevated right heart filling and pulmonary artery pressures with significant pulmonary vascular resistance. Mildly reduced Fick cardiac output/index.    Patient Profile     80 y.o. female with history of normal coronary arteries by LHC in 09/2005, persistent A. Fib status post DCCV on 11/30/2020 with recurrent Afib s/p repeat DCCV on amiodarone 01/20/2021, postoperative atrial flutter in 01/2015, less than 50% left renal artery stenosis by imaging in 2021, 1 to 39% bilateral ICA carotid stenosis by imaging in 2017, DM2, HTN, HLD, and obesity who is being seen today for the evaluation of acute hypoxic respiratory failure  Assessment & Plan    1.  Acute hypoxic respiratory failure: Flash pulmonary edema in the setting of hypertension, acute on chronic diastolic CHF following recent atrial fibrillation and cardioversion Improving on IV Lasix, would continue given continued abdominal fullness, will likely need 1 or 2 more days  2. Hypertensive urgency with RAS: -Prior renal artery ultrasound in 08/2019 showed < 50%  left RAS Blood pressure stabilized, continue Lopressor, losartan, and IV Lasix   3.  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 Persistent Afib/flutter: -Status post recent briefly successful DCCV in 11/2020 with repeat briefly successful DCCV on amiodarone 01/20/2021 Admitted to the hospital July 24, normal sinus rhythm at that time, back into atrial fibrillation July 25 -CHA2DS2-VASc at least 6 (CHF, HTN, age x2, DM2, sex category) Back on Eliquis 5 twice daily, has had heparin bridge which will be stopped as Eliquis is started -Patient is indicated she would like to consider cardioversion before going home though will hold off given continued fluid retention abdominal fullness and need  for further diuresis  5.  HLD: -LDL 38 in 07/2020, continue Crestor  Long discussion with patient and family at bedside concerning various treatment options for her atrial fibrillation, heart failure, discussed diet, fluid and salt restriction, titration of her diuretic at home  Total encounter time more than 35 minutes  Greater than 50% was spent in counseling and coordination of care with the patient   For questions or updates, please contact [facility] Please consult [email or web address] for contact info under        Signed, Ida Rogue, MD  01/26/2021, 6:13 PM

## 2021-01-26 NOTE — Progress Notes (Signed)
PROGRESS NOTE    Bonnie Rowland  [id number] DOB: 1940-08-23 DOA: 01/23/2021 PCP: Steele Sizer, MD   Assessment & Plan:   Principal Problem:   Hypertensive emergency Active Problems:   AF (paroxysmal atrial fibrillation) (HCC)   Type 2 diabetes mellitus with hyperlipidemia (HCC)   Acute respiratory failure with hypoxia (HCC)   Hypertensive urgency   Acute on chronic combined systolic and diastolic CHF (congestive heart failure) (HCC)   Elevated troponin   Acute hypoxic respiratory failure: initially requiring BiPAP.  Pulse ox of 77% on 2 L in the ER.  Continue on supplemental oxygen and wean as tolerated  Hypertensive urgency: resolved but still w/ HTN. Continue on metoprolol, losartan.   Acute on chronic combined CHF: echo shows EF [id number], diastolic function could not be evaluated. Continue on metoprolol, IV lasix, & losartan as per cardio. Monitor I/Os  Elevated troponin: likely secondary to demand ischemia. 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 Continue on IV lasix and restart eliquis   PAF: had recent cardioversion and now in atrial fibrillation. Continue on amiodarone and restart home dose of eliquis today as per cardio   Hypokalemia: potassium given.   Hyponatremia: labile. Will continue to monitor   DM2: well controled w/ HbA1c 6.5. Continue on glargine, SSI w/ accuchecks   HLD: continue on statin   Weakness: PT ordered  Lactic acidosis: on presentation.      DVT prophylaxis: IV heparin  Code Status: full  Family Communication: dicussed pt's care w/ pt's husband, and answered his questions Disposition Plan: depends on PT/OT recs   Level of care: Progressive Cardiac  Status is: Inpatient  Remains inpatient appropriate because:Ongoing diagnostic testing needed not appropriate for outpatient work up, Unsafe d/c plan, IV treatments  appropriate due to intensity of illness or inability to take PO, and Inpatient level of care appropriate due to severity of illness  Dispo: The patient is from: Home              Anticipated d/c is to: Home              Patient currently is not medically stable to d/c.   Difficult to place patient: unclear    Consultants:  Cardio   Procedures:   Antimicrobials:   Subjective: Pt c/o shortness of breath but improved from day prior   Objective: Vitals:   01/25/21 1958 01/26/21 0433 01/26/21 0500 01/26/21 0754  BP: 118/65 113/83  119/68  Pulse: (!) 103 78  81  Resp: '18 16  18  '$ Temp: 99.1 F (37.3 C) (!) 97.5 F (36.4 C)  98.1 F (36.7 C)  TempSrc:    Oral  SpO2: 96% 99%  97%  Weight:   87.3 kg   Height:        Intake/Output Summary (Last 24 hours) at 01/26/2021 0803 Last data filed at 01/26/2021 0502 Gross per 24 hour  Intake 724.59 ml  Output 2250 ml  Net -1525.41 ml   Filed Weights   01/24/21 0100 01/25/21 0500 01/26/21 0500  Weight: 89 kg 87.9 kg 87.3 kg    Examination:  General exam: Appears comfortable  Respiratory system: decreased breath sounds b/l. No wheezes Cardiovascular system: irregularly irregular. No rubs or clicks  Gastrointestinal system: Abd is soft, NT, ND & hypoactive bowel sounds  Central nervous system: Alert and oriented. Moves all extremities  Psychiatry: Judgement and insight appears normal. Flat  mood and affect    Data Reviewed: I have personally reviewed following labs and imaging studies  CBC: Recent Labs  Lab 01/22/21 2321 01/24/21 0419 01/25/21 0450 01/26/21 0112  WBC 11.3* 15.8* 11.0* 11.2*  HGB 13.7 12.5 12.9 12.9  HCT 40.7 36.3 37.8 37.7  MCV 95.8 95.3 94.5 94.5  PLT 222 185 188 [id number]   Basic Metabolic Panel: Recent Labs  Lab 01/23/21 0413 01/24/21 0419 01/25/21 0450 01/26/21 0112  NA 134* 132* 133* 134*  K 4.3 3.8 3.3* 3.4*  CL 96* 95* 93* 94*  CO2 28 30 32 36*  GLUCOSE 284* 158* 139* 199*  BUN '21 21 17 19   '$ CREATININE 0.88 0.79 0.69 0.81  CALCIUM 9.0 7.8* 7.7* 7.8*   GFR: Estimated Creatinine Clearance: 60 mL/min (by C-G formula based on SCr of 0.81 mg/dL). Liver Function Tests: Recent Labs  Lab 01/23/21 0413  AST 42*  ALT 34  ALKPHOS 81  BILITOT 1.6*  PROT 7.1  ALBUMIN 4.0   No results for input(s): LIPASE, AMYLASE in the last 168 hours. No results for input(s): AMMONIA in the last 168 hours. Coagulation Profile: No results for input(s): INR, PROTIME in the last 168 hours. Cardiac Enzymes: No results for input(s): CKTOTAL, CKMB, CKMBINDEX, TROPONINI in the last 168 hours. BNP (last 3 results) No results for input(s): PROBNP in the last 8760 hours. HbA1C: No results for input(s): HGBA1C in the last 72 hours. CBG: Recent Labs  Lab 01/24/21 1957 01/25/21 0748 01/25/21 1206 01/25/21 1718 01/25/21 2039  GLUCAP 191* 129* 134* 202* 204*   Lipid Profile: No results for input(s): CHOL, HDL, LDLCALC, TRIG, CHOLHDL, LDLDIRECT in the last 72 hours. Thyroid Function Tests: No results for input(s): TSH, T4TOTAL, FREET4, T3FREE, THYROIDAB in the last 72 hours. Anemia Panel: No results for input(s): VITAMINB12, FOLATE, FERRITIN, TIBC, IRON, RETICCTPCT in the last 72 hours. Sepsis Labs: Recent Labs  Lab 01/23/21 0413 01/23/21 0600 01/24/21 0419  PROCALCITON <0.10  --  0.21  LATICACIDVEN 3.0* 3.0*  --     Recent Results (from the past 240 hour(s))  SARS CORONAVIRUS 2 (TAT 6-24 HRS) Nasopharyngeal Nasopharyngeal Swab     Status: None   Collection Time: 01/23/21 12:43 AM   Specimen: Nasopharyngeal Swab  Result Value Ref Range Status   SARS Coronavirus 2 NEGATIVE NEGATIVE Final    Comment: (NOTE) SARS-CoV-2 target nucleic acids are NOT DETECTED.  The SARS-CoV-2 RNA is generally detectable in upper and lower respiratory specimens during the acute phase of infection. Negative results do not preclude SARS-CoV-2 infection, do not rule out co-infections with other pathogens, and  should not be used as the sole basis for treatment or other patient management decisions. Negative results must be combined with clinical observations, patient history, and epidemiological information. The expected result is Negative.  Fact Sheet for Patients: SugarRoll.be  Fact Sheet for Healthcare Providers: https://www.woods-mathews.com/  This test is not yet approved or cleared by the [place] FDA and  has been authorized for detection and/or diagnosis of SARS-CoV-2 by FDA under an Emergency Use Authorization (EUA). This EUA will remain  in effect (meaning this test can be used) for the duration of the COVID-19 declaration under Se ction 564(b)(1) of the Act, 21 U.S.C. section 360bbb-3(b)(1), unless the authorization is terminated or revoked sooner.  Performed at [facility], [place] [address], [place], [place] [address]   Culture, blood (routine x 2)     Status: None (Preliminary result)   Collection Time: 01/23/21  4:02 AM   Specimen: BLOOD  Result Value Ref Range Status   Specimen Description BLOOD LEFT AC  Final   Special Requests   Final    BOTTLES DRAWN AEROBIC AND ANAEROBIC Blood Culture adequate volume   Culture   Final    NO GROWTH 3 DAYS Performed at [facility], [address], [place], [place] [address]    Report Status PENDING  Incomplete  Culture, blood (routine x 2)     Status: None (Preliminary result)   Collection Time: 01/23/21  4:13 AM   Specimen: BLOOD  Result Value Ref Range Status   Specimen Description BLOOD LEFT HAND  Final   Special Requests   Final    BOTTLES DRAWN AEROBIC AND ANAEROBIC Blood Culture adequate volume   Culture   Final    NO GROWTH 3 DAYS Performed at [facility], [address], [place], [place] [address]    Report Status PENDING  Incomplete         Radiology Studies: CARDIAC CATHETERIZATION  Result Date: 01/25/2021 Conclusions: Mild to moderate,  non-obstructive coronary artery disease, including 30% mid LAD disease, 50-60% mid/distal LCx stenosis, and 60% proximal/mid RCA lesion. Mildly to moderately reduced left ventricular systolic function with mid anterior hypo/akinesis; query atypical Takotsubo variant.  LVEF ~45%. Mildly elevated left heart filling pressures. Moderately elevated right heart filling and pulmonary artery pressures with significant pulmonary vascular resistance. Mildly reduced Fick cardiac output/index. Recommendations: Continue IV diuresis. Advance goal-directed medical therapy for cardiomyopathy, as tolerated.  Patient may benefit from outpatient advanced heart failure consultation. Increase metoprolol for rate control of atrial fibrillation. Restart IV heparin in 2 hours after TR band removal.  Transition back to apixaban tomorrow AM if no evidence of bleeding/vascular injury. Secondary prevention of coronary artery disease. Nelva Bush, MD [facility]       Scheduled Meds:  acidophilus  1 capsule Oral QHS   amiodarone  400 mg Oral BID   aspirin EC  81 mg Oral Daily   furosemide  40 mg Intravenous BID   insulin aspart  0-15 Units Subcutaneous TID WC   insulin aspart  0-5 Units Subcutaneous QHS   insulin glargine  15 Units Subcutaneous QHS   losartan  12.5 mg Oral Daily   metoprolol tartrate  25 mg Oral Q6H   multivitamin-lutein  1 capsule Oral BID   rosuvastatin  10 mg Oral QHS   sodium chloride flush  3 mL Intravenous Q12H   tiZANidine  2 mg Oral QHS   Continuous Infusions:  sodium chloride     heparin 800 Units/hr (01/26/21 0502)     LOS: 3 days    Time spent: 30 mins    Wyvonnia Dusky, MD [facility] Pager [phone]  If 7PM-7AM, please contact night-coverage 01/26/2021, 8:03 AM

## 2021-01-26 NOTE — Evaluation (Signed)
Physical Therapy Evaluation Patient Details Name: Bonnie Rowland MRN: [id number] DOB: September 29, 1940 Today's Date: 01/26/2021   History of Present Illness  Pt admitted for HTN emergency with complaints of SOB symptoms. Pt is s/p radial heart cath on 01/25/21. History includes HTN, DM, Afib with cardioversion 7/22.  Clinical Impression  Pt is a pleasant 80 year old female who was admitted for HTN emergency. Pt performs transfers and ambulation with cga and 3 point cane. Does reach out for B UE use, educated on "broken wing" due to heart cath site. Pt demonstrates deficits with strength/endurance/mobility/balance. Ideally will need use of RW for further mobility due to decreased balance. HR at 114bpm with exertion and O2 sats at 94% on RA. Is very close to baseline level and has good family support at home. Would benefit from skilled PT to address above deficits and promote optimal return to PLOF. Recommend transition to [place] upon discharge from acute hospitalization.     Follow Up Recommendations Home health PT;Supervision for mobility/OOB    Equipment Recommendations  None recommended by PT    Recommendations for Other Services       Precautions / Restrictions Precautions Precautions: Fall Restrictions Weight Bearing Restrictions: No      Mobility  Bed Mobility               General bed mobility comments: NT, received on BSC    Transfers Overall transfer level: Needs assistance Equipment used:  (3 point cane) Transfers: Sit to/from Stand Sit to Stand: Min guard         General transfer comment: safe technique, however reaches out with R UE for support.  Ambulation/Gait Ambulation/Gait assistance: Min guard Gait Distance (Feet): 20 Feet Assistive device:  (3 point cane) Gait Pattern/deviations: Step-to pattern     General Gait Details: cautious step to gait pattern around bed to recliner. Reaches out for furniture despite cane use. Would benefit from RW for  further mobility  Stairs            Wheelchair Mobility    Modified Rankin (Stroke Patients Only)       Balance Overall balance assessment: Needs assistance Sitting-balance support: Feet supported Sitting balance-Leahy Scale: Good     Standing balance support: Single extremity supported Standing balance-Leahy Scale: Fair                               Pertinent Vitals/Pain Pain Assessment: No/denies pain    Home Living Family/patient expects to be discharged to:: Private residence (from [place]) Living Arrangements: Spouse/significant other Available Help at Discharge: Family;Available 24 hours/day Type of Home: Independent living facility Home Access: Level entry     Home Layout: One level Home Equipment: Walker - 2 wheels (3 point cane)      Prior Function Level of Independence: Independent with assistive device(s)         Comments: uses 3 point cane for mobility     Hand Dominance        Extremity/Trunk Assessment   Upper Extremity Assessment Upper Extremity Assessment: Overall WFL for tasks assessed    Lower Extremity Assessment Lower Extremity Assessment: Generalized weakness (B LE grossly 4/5)       Communication   Communication: HOH  Cognition Arousal/Alertness: Awake/alert Behavior During Therapy: WFL for tasks assessed/performed Overall Cognitive Status: Within Functional Limits for tasks assessed  General Comments General comments (skin integrity, edema, etc.): IV site bleeding upon arrival-RN notified    Exercises Other Exercises Other Exercises: Pt on BSC upon arrival. Needs min assist for hygiene and support during transition off toilet.   Assessment/Plan    PT Assessment Patient needs continued PT services  PT Problem List Decreased strength;Decreased balance;Decreased mobility;Obesity       PT Treatment Interventions Gait training;Therapeutic  exercise;Balance training    PT Goals (Current goals can be found in the Care Plan section)  Acute Rehab PT Goals Patient Stated Goal: to go back to [facility] PT Goal Formulation: With patient Time For Goal Achievement: 02/09/21 Potential to Achieve Goals: Good    Frequency Min 2X/week   Barriers to discharge        Co-evaluation               AM-PAC PT "6 Clicks" Mobility  Outcome Measure Help needed turning from your back to your side while in a flat bed without using bedrails?: A Little Help needed moving from lying on your back to sitting on the side of a flat bed without using bedrails?: A Little Help needed moving to and from a bed to a chair (including a wheelchair)?: A Little Help needed standing up from a chair using your arms (e.g., wheelchair or bedside chair)?: A Little Help needed to walk in hospital room?: A Little Help needed climbing 3-5 steps with a railing? : A Lot 6 Click Score: 17    End of Session Equipment Utilized During Treatment: Gait belt Activity Tolerance: Patient tolerated treatment well Patient left: in chair;with chair alarm set Nurse Communication: Mobility status PT Visit Diagnosis: Muscle weakness (generalized) (M62.81);Difficulty in walking, not elsewhere classified (R26.2);Unsteadiness on feet (R26.81)    Time: [id number] PT Time Calculation (min) (ACUTE ONLY): 22 min   Charges:   PT Evaluation $PT Eval Low Complexity: 1 Low PT Treatments $Therapeutic Activity: 8-22 mins        Greggory Stallion, PT, DPT [phone]   Bonnie Rowland 01/26/2021, 4:26 PM

## 2021-01-26 NOTE — Consult Note (Signed)
ANTICOAGULATION CONSULT NOTE - Follow Up Consult  Pharmacy Consult for Heparin gtt Indication: atrial fibrillation  Allergies  Allergen Reactions   Cyclobenzaprine Hypertension   Cheese Other (See Comments)    bloating   Ciprofloxacin Hcl Other (See Comments)    Muscle pain   Coconut Oil     Upset stomach   Keflex [Cephalexin] Other (See Comments)    Patient prefers not to take this medication due to the side effects, caused tendon issues    Patient Measurements: Height: '5\' 3"'$  (160 cm) Weight: 87.9 kg (193 lb 12.6 oz) IBW/kg (Calculated) : 52.4 Heparin Dosing Weight: 72.7 kg  Vital Signs: Temp: 99.1 F (37.3 C) (07/27 1958) BP: 118/65 (07/27 1958) Pulse Rate: 103 (07/27 1958)  Labs: Recent Labs    01/23/21 0413 01/23/21 1722 01/24/21 0032 01/24/21 0419 01/24/21 1247 01/24/21 2209 01/25/21 0450 01/26/21 0112  HGB   < >  --   --  12.5  --   --  12.9 12.9  HCT  --   --   --  36.3  --   --  37.8 37.7  PLT  --   --   --  185  --   --  188 189  APTT  --   --   --  38*   < > 72* 75* 68*  HEPARINUNFRC  --   --   --  >1.10*  --   --  >1.10*  --   CREATININE  --   --   --  0.79  --   --  0.69 0.81  TROPONINIHS  --  692* 481*  --   --   --   --   --    < > = values in this interval not displayed.     Estimated Creatinine Clearance: 60.2 mL/min (by C-G formula based on SCr of 0.81 mg/dL).   Medications:  Pt received 0.'5mg'$ /kg dose of lovenox (7/25 0756) and a '5mg'$  dose of eliquis (7/25 1655). 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 Pharmacy consulted for the conversion of Eliquis to Heparin gtt for mgmt of Afib.  Date Time aPTT/HL Rate/Comment 7/25  1655     ---   PM Dose of eliquis '5mg'$   7/26 1247 149   7/26    2209     72 therapeutic x 1 @ 800 units/hr 7/27 0450  75 therapeutic  x 2 @ 800 units/hr 7/27 1027 STOPPED FOR PROCEDURE --resumed @ 1700  7/28 0112  68 therapeutic x 1   Goal of Therapy:  Heparin level 0.3-0.7 units/ml aPTT 66-102s seconds Monitor platelets by anticoagulation protocol: Yes   Plan:  CONTINUE heparin therapeutic rate of 800 units/hr   HL/CBC daily while on heparin  Switch to heparin level once aPTT and heparin level correlate.  F/U transition back to [place], PharmD, BCPS Clinical Pharmacist 01/26/2021 2:01 AM

## 2021-01-27 DIAGNOSIS — I5043 Acute on chronic combined systolic (congestive) and diastolic (congestive) heart failure: Secondary | ICD-10-CM | POA: Diagnosis not present

## 2021-01-27 DIAGNOSIS — I161 Hypertensive emergency: Secondary | ICD-10-CM | POA: Diagnosis not present

## 2021-01-27 DIAGNOSIS — I480 Paroxysmal atrial fibrillation: Secondary | ICD-10-CM | POA: Diagnosis not present

## 2021-01-27 DIAGNOSIS — E876 Hypokalemia: Secondary | ICD-10-CM | POA: Diagnosis not present

## 2021-01-27 LAB — CBC
HCT: 39.5 % (ref 36.0–46.0)
Hemoglobin: 13.9 g/dL (ref 12.0–15.0)
MCH: 33.6 pg (ref 26.0–34.0)
MCHC: 35.2 g/dL (ref 30.0–36.0)
MCV: 95.4 fL (ref 80.0–100.0)
Platelets: 202 10*3/uL (ref 150–400)
RBC: 4.14 MIL/uL (ref 3.87–5.11)
RDW: 12.9 % (ref 11.5–15.5)
WBC: 10 10*3/uL (ref 4.0–10.5)
nRBC: 0 % (ref 0.0–0.2)

## 2021-01-27 LAB — BASIC METABOLIC PANEL
Anion gap: 10 (ref 5–15)
BUN: 19 mg/dL (ref 8–23)
CO2: 33 mmol/L — ABNORMAL HIGH (ref 22–32)
Calcium: 8 mg/dL — ABNORMAL LOW (ref 8.9–10.3)
Chloride: 92 mmol/L — ABNORMAL LOW (ref 98–111)
Creatinine, Ser: 0.71 mg/dL (ref 0.44–1.00)
GFR, Estimated: >60 mL/min (ref >60)
Glucose, Bld: 204 mg/dL — ABNORMAL HIGH (ref 70–99)
Potassium: 3.3 mmol/L — ABNORMAL LOW (ref 3.5–5.1)
Sodium: 135 mmol/L (ref 135–145)

## 2021-01-27 LAB — GLUCOSE, CAPILLARY
Glucose-Capillary: 188 mg/dL — ABNORMAL HIGH (ref 70–99)
Glucose-Capillary: 190 mg/dL — ABNORMAL HIGH (ref 70–99)
Glucose-Capillary: 184 mg/dL — ABNORMAL HIGH (ref 70–99)
Glucose-Capillary: 211 mg/dL — ABNORMAL HIGH (ref 70–99)

## 2021-01-27 MED ORDER — METOPROLOL TARTRATE 50 MG PO TABS
50.0000 mg | ORAL_TABLET | Freq: Two times a day (BID) | ORAL | Status: DC
  Administered 2021-01-27: 50 mg via ORAL
  Filled 2021-01-27: qty 1

## 2021-01-27 MED ORDER — POTASSIUM CHLORIDE CRYS ER 20 MEQ PO TBCR
40.0000 meq | EXTENDED_RELEASE_TABLET | Freq: Every day | ORAL | Status: DC
  Administered 2021-01-28 – 2021-01-29 (×2): 40 meq via ORAL
  Filled 2021-01-27 (×2): qty 2

## 2021-01-27 MED ORDER — POTASSIUM CHLORIDE CRYS ER 20 MEQ PO TBCR
40.0000 meq | EXTENDED_RELEASE_TABLET | Freq: Once | ORAL | Status: AC
  Administered 2021-01-27: 40 meq via ORAL
  Filled 2021-01-27: qty 2

## 2021-01-27 MED ORDER — INSULIN GLARGINE 100 UNIT/ML ~~LOC~~ SOLN
18.0000 [IU] | Freq: Every day | SUBCUTANEOUS | Status: DC
  Administered 2021-01-27 – 2021-01-30 (×4): 18 [IU] via SUBCUTANEOUS
  Filled 2021-01-27 (×5): qty 0.18

## 2021-01-27 MED ORDER — METOPROLOL TARTRATE 25 MG PO TABS
25.0000 mg | ORAL_TABLET | Freq: Once | ORAL | Status: AC
  Administered 2021-01-27: 25 mg via ORAL
  Filled 2021-01-27: qty 1

## 2021-01-27 NOTE — Progress Notes (Signed)
Progress Note  Patient Name: Bonnie Rowland Date of Encounter: 01/27/2021  Primary Cardiologist: Ida Rogue, MD  Subjective   No chest pain or sob.  Weighed on standing scale this AM and wt down to 82.9 kg - she says she's never been this low before.  Abd fullness/tightness improving.  Inpatient Medications    Scheduled Meds:  acidophilus  1 capsule Oral QHS   amiodarone  400 mg Oral BID   apixaban  5 mg Oral BID   furosemide  40 mg Intravenous BID   insulin aspart  0-15 Units Subcutaneous TID WC   insulin aspart  0-5 Units Subcutaneous QHS   insulin glargine  15 Units Subcutaneous QHS   losartan  12.5 mg Oral Daily   metoprolol tartrate  25 mg Oral Q6H   multivitamin-lutein  1 capsule Oral BID   rosuvastatin  10 mg Oral QHS   sodium chloride flush  3 mL Intravenous Q12H   tiZANidine  2 mg Oral QHS   Continuous Infusions:  sodium chloride     PRN Meds: sodium chloride, acetaminophen **OR** acetaminophen, fluticasone, ondansetron **OR** ondansetron (ZOFRAN) IV, polyvinyl alcohol, sodium chloride flush   Vital Signs    Vitals:   01/26/21 1602 01/26/21 1924 01/27/21 0436 01/27/21 0736  BP: 122/72 117/76 104/64 (!) 120/55  Pulse: 87 98 93 64  Resp: '18 17 18 18  '$ Temp: 97.9 F (36.6 C) 97.8 F (36.6 C) 98.6 F (37 C) 98.2 F (36.8 C)  TempSrc: Oral Oral Oral Oral  SpO2: 95% 96% 96% 97%  Weight:   82.9 kg   Height:        Intake/Output Summary (Last 24 hours) at 01/27/2021 0751 Last data filed at 01/27/2021 0436 Gross per 24 hour  Intake 960 ml  Output 1600 ml  Net -640 ml   Filed Weights   01/25/21 0500 01/26/21 0500 01/27/21 0436  Weight: 87.9 kg 87.3 kg 82.9 kg    Physical Exam   GEN: Well nourished, well developed, in no acute distress.  HEENT: Grossly normal.  Neck: Supple, mild JVD, no carotid bruits, or masses. Cardiac: IR, IR, tachy, no murmurs, rubs, or gallops. No clubbing, cyanosis, edema.  R radial and R brachial cath sites w/  ecchymosis.  No bleeding/bruits.  R brachial hematoma softening.  Radials 2+, DP/PT 1+ and equal bilaterally.  Respiratory:  Respirations regular and unlabored, diminished breath sounds @ bilat bases. GI: obese, soft, nontender, nondistended, BS + x 4. MS: no deformity or atrophy. Skin: warm and dry, no rash. Neuro:  Strength and sensation are intact. Psych: AAOx3.  Normal affect.  Labs    Chemistry Recent Labs  Lab 01/23/21 0413 01/24/21 0419 01/25/21 0450 01/26/21 0112 01/27/21 0439  NA 134*   < > 133* 134* 135  K 4.3   < > 3.3* 3.4* 3.3*  CL 96*   < > 93* 94* 92*  CO2 28   < > 32 36* 33*  GLUCOSE 284*   < > 139* 199* 204*  BUN 21   < > '17 19 19  '$ CREATININE 0.88   < > 0.69 0.81 0.71  CALCIUM 9.0   < > 7.7* 7.8* 8.0*  PROT 7.1  --   --   --   --   ALBUMIN 4.0  --   --   --   --   AST 42*  --   --   --   --   ALT 34  --   --   --   --  ALKPHOS 81  --   --   --   --   BILITOT 1.6*  --   --   --   --   GFRNONAA >60   < > >60 >60 >60  ANIONGAP 10   < > 8 4* 10   < > = values in this interval not displayed.     Hematology Recent Labs  Lab 01/25/21 0450 01/26/21 0112 01/27/21 0439  WBC 11.0* 11.2* 10.0  RBC 4.00 3.99 4.14  HGB 12.9 12.9 13.9  HCT 37.8 37.7 39.5  MCV 94.5 94.5 95.4  MCH 32.3 32.3 33.6  MCHC 34.1 34.2 35.2  RDW 13.0 13.0 12.9  PLT 188 189 202    Cardiac Enzymes  Recent Labs  Lab 01/22/21 2321 01/23/21 0120 01/23/21 1722 01/24/21 0032  TROPONINIHS 10 87* 692* 481*      BNP Recent Labs  Lab 01/22/21 2321  BNP 237.3*    Lipids  Lab Results  Component Value Date   CHOL 130 07/12/2020   HDL 75 07/12/2020   LDLCALC 38 07/12/2020   TRIG 86 07/12/2020   CHOLHDL 1.7 07/12/2020    HbA1c  Lab Results  Component Value Date   HGBA1C 6.5 (H) 01/09/2021    Radiology    -------  Telemetry    Afib, 90's to 1-teens - Personally Reviewed  Cardiac Studies   2D Echocardiogram 7.25.2022  1. Left ventricular ejection fraction, by  estimation, is 45 to 50%. The  left ventricle has mildly decreased function. The left ventricle  demonstrates regional wall motion abnormalities (see scoring  diagram/findings for description). There is mild left  ventricular hypertrophy. Left ventricular diastolic function could not be  evaluated. There is severe hypokinesis of the left ventricular, entire  anterior wall and anteroseptal wall.   2. Right ventricular systolic function is moderately reduced. The right  ventricular size is normal. Mildly increased right ventricular wall  thickness.   3. The mitral valve is degenerative. Trivial mitral valve regurgitation.   4. The aortic valve is tricuspid. Aortic valve regurgitation not well  assessed.  _____________ Cardiac Catheterization  7.27.2022  Left Anterior Descending  Vessel is large.  Prox LAD to Mid LAD lesion is 20% stenosed.  First Diagonal Branch  Vessel is small in size.  Second Diagonal Branch  Vessel is small in size.  Third Diagonal Branch  Vessel is small in size.  Left Circumflex  Vessel is moderate in size.  Mid Cx to Dist Cx lesion is 55% stenosed.  First Obtuse Marginal Branch  Vessel is small in size.  Second Obtuse Marginal Branch  Vessel is moderate in size.  Third Obtuse Marginal Branch  Vessel is moderate in size. There is moderate disease in the vessel.  Right Coronary Artery  Vessel is large.  Prox RCA to Mid RCA lesion is 60% stenosed. The lesion is focal.  Dist RCA lesion is 30% stenosed.  Right Posterior Descending Artery  Vessel is moderate in size.  Right Posterior Atrioventricular Artery  Vessel is moderate in size.  First Right Posterolateral Branch  Vessel is small in size.  Second Right Posterolateral Branch  Vessel is small in size.  Third Right Posterolateral Branch  Vessel is small in size.  Pressures RA (mean): 14 mmHg RV (S/EDP): 60/12 mmHg PA (S/D, mean): 60/30 (40) mmHg PCWP (mean): 25 mmHg  Ao sat: 92% PA sat:  65%  Fick CO: 3.8 L/min Fick CI: 2.0 L/min/m^2  PVR: 6.6 Wood units   Conclusions:  Mild to moderate, non-obstructive coronary artery disease, including 30% mid LAD disease, 50-60% mid/distal LCx stenosis, and 60% proximal/mid RCA lesion. Mildly to moderately reduced left ventricular systolic function with mid anterior hypo/akinesis; query atypical Takotsubo variant.  LVEF ~45%. Mildly elevated left heart filling pressures. Moderately elevated right heart filling and pulmonary artery pressures with significant pulmonary vascular resistance. Mildly reduced Fick cardiac output/index.   Recommendations: Continue IV diuresis. Advance goal-directed medical therapy for cardiomyopathy, as tolerated.  Patient may benefit from outpatient advanced heart failure consultation. Increase metoprolol for rate control of atrial fibrillation. Restart IV heparin in 2 hours after TR band removal.  Transition back to apixaban tomorrow AM if no evidence of bleeding/vascular injury. Secondary prevention of coronary artery disease. _____________  Patient Profile     80 y.o. female with history of normal coronary arteries by LHC in 09/2005, persistent A. Fib status post DCCV on 11/30/2020 with recurrent Afib s/p repeat DCCV on amiodarone 01/20/2021, postoperative atrial flutter in 01/2015, less than 50% left renal artery stenosis by imaging in 2021, 1 to 39% bilateral ICA carotid stenosis by imaging in 2017, DM2, HTN, HLD, and obesity who was admitted 7/25 w/ resp failure and pulmonary edema in the setting of new LV dysfxn (EF 45-50%), and recurrent Afib on 7/25.  Cath 7/27 w/ nonobs dzs and mildly elevated filling pressures.  Assessment & Plan    1.  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   Diagnostic catheterization without any significant coronary disease and suspicion for Takotsubo variant.  She subsequently developed recurrent atrial flutter/fib.  She has responded well to diuresis.  She is  minus 640 ml overnight and minus 6.9L since admission.  Stood on standing scale this AM w/ wt of 82.9 kg - significantly lower than previously recorded on bed scales.  Diminished breath sounds @ bases.  Mildly elev JVP.  Notes abd girth is improving but still feels a little bloated. Renal fxn stable.  Rec continuing IV diuresis for at least today w/ eye toward transitioning to oral torsemide this weekend.  Begin consolidating ? blocker - will change to 50 bid today w/ eye toward transitioning to toprol xl 100 daily over the weekend.  Cont losartan and if BP allows, will look to transition to entreso - not sure BP will tolerate currently.  2.  Persistent Atrial fibrillation:  Rates 90's to 1-teens.  Consolidating ? blocker.  Cont amio reload - currently on 400 bid (3.2 grams by end of today).  Was on 200 bid @ home previously.  Cont eliquis (bridged to cath previously).  Provided that volume status stable this weekend, will plan DCCV on Monday.  3.  Essential HTN stable.  Continue current regimen.  Less than 50% left renal artery stenosis on ultrasound earlier this year.  4.  HL: LDL 38 earlier this year.  Continue statin therapy.  5.  Nonobs CAD/demand ischemia: Cath without significant coronary disease.  Continue beta-blocker and statin therapy.  No aspirin in the setting of Eliquis.  6.  Hypokalemia:  3.3 this AM. Supplementation ordered.  Signed, Murray Hodgkins, NP  01/27/2021, 7:51 AM    For questions or updates, please contact   Please consult [email or web address] for contact info under Cardiology/STEMI.

## 2021-01-27 NOTE — Progress Notes (Signed)
Physical Therapy Treatment Patient Details Name: Bonnie Rowland MRN: [id number] DOB: 1941-02-03 Today's Date: 01/27/2021    History of Present Illness Pt admitted for HTN emergency with complaints of SOB symptoms. Pt is s/p radial heart cath on 01/25/21. History includes HTN, DM, Afib with cardioversion 7/22.    PT Comments    Patient received in recliner, very happy about my arrival and being able to go for walk. Patient's husband at bedside. She is min guard for sit to stand, ambulated 150 feet with RW and min guard. Slow, steady pace. Performed toileting with min assist to get on/off commode prior to returning to recliner.  Patient will continue to benefit from skilled PT while here to improve functional independence, strength and activity tolerance.         Follow Up Recommendations  Home health PT;Supervision - Intermittent     Equipment Recommendations  None recommended by PT    Recommendations for Other Services       Precautions / Restrictions Precautions Precautions: Fall Restrictions Weight Bearing Restrictions: No    Mobility  Bed Mobility                    Transfers Overall transfer level: Needs assistance Equipment used: Rolling walker (2 wheeled) Transfers: Sit to/from Stand Sit to Stand: Min guard;Min assist         General transfer comment: patient received in recliner and returned to recliner  Ambulation/Gait Ambulation/Gait assistance: Min guard Gait Distance (Feet): 150 Feet Assistive device: Rolling walker (2 wheeled) Gait Pattern/deviations: Step-through pattern Gait velocity: decr   General Gait Details: Patient ambulates with slow pace, steady, no lob.   Stairs             Wheelchair Mobility    Modified Rankin (Stroke Patients Only)       Balance Overall balance assessment: Needs assistance Sitting-balance support: Feet supported Sitting balance-Leahy Scale: Good     Standing balance support: Bilateral  upper extremity supported;During functional activity Standing balance-Leahy Scale: Fair Standing balance comment: benefits from B UE support, min guard                            Cognition Arousal/Alertness: Awake/alert Behavior During Therapy: WFL for tasks assessed/performed Overall Cognitive Status: Within Functional Limits for tasks assessed                                        Exercises      General Comments        Pertinent Vitals/Pain Pain Assessment: No/denies pain    Home Living                      Prior Function            PT Goals (current goals can now be found in the care plan section) Acute Rehab PT Goals Patient Stated Goal: to go back to [facility] PT Goal Formulation: With patient/family Time For Goal Achievement: 02/09/21 Potential to Achieve Goals: Good Progress towards PT goals: Progressing toward goals    Frequency    Min 2X/week      PT Plan Current plan remains appropriate    Co-evaluation              AM-PAC PT "6 Clicks" Mobility   Outcome Measure  Help needed  turning from your back to your side while in a flat bed without using bedrails?: A Little Help needed moving from lying on your back to sitting on the side of a flat bed without using bedrails?: A Little Help needed moving to and from a bed to a chair (including a wheelchair)?: A Little Help needed standing up from a chair using your arms (e.g., wheelchair or bedside chair)?: A Little Help needed to walk in hospital room?: A Little Help needed climbing 3-5 steps with a railing? : A Little 6 Click Score: 18    End of Session Equipment Utilized During Treatment: Gait belt Activity Tolerance: Patient tolerated treatment well Patient left: in chair;with chair alarm set;with family/visitor present Nurse Communication: Mobility status PT Visit Diagnosis: Muscle weakness (generalized) (M62.81);Difficulty in walking, not elsewhere  classified (R26.2);Unsteadiness on feet (R26.81)     Time: 1535-1600 PT Time Calculation (min) (ACUTE ONLY): 25 min  Charges:  $Gait Training: 8-22 mins $Therapeutic Activity: 8-22 mins                    Shynice Sigel, PT, GCS 01/27/21,4:15 PM

## 2021-01-27 NOTE — Care Management Important Message (Signed)
Important Message  Patient Details  Name: Bonnie Rowland MRN: [id number] Date of Birth: [birth year]   Medicare Important Message Given:  Yes     Bonnie Rowland A Yaasir Menken 01/27/2021, 12:22 PM

## 2021-01-27 NOTE — Evaluation (Signed)
Occupational Therapy Evaluation Patient Details Name: Bonnie Rowland MRN: [id number] DOB: 09-29-1940 Today's Date: 01/27/2021    History of Present Illness Pt admitted for HTN emergency with complaints of SOB symptoms. Pt is s/p radial heart cath on 01/25/21. History includes HTN, DM, Afib with cardioversion 7/22.   Clinical Impression   [name] was seen for OT evaluation this date. Prior to hospital admission, pt was MOD I for mobility and ADLs. Pt lives alone at [place]. Pt presents to acute OT near baseline functional mobility. Pt currently requires SUPERVISION + RW for toilet t/f (follows instructions to rest RUE lightly on walker for remainder of morning). MOD I for LB access seated EOB. SUPERVISION grooming standing sink side. All education completed and no skilled acute OT needs. Will sign off. Please reconsult if new needs arise.     Follow Up Recommendations  No OT follow up    Equipment Recommendations  3 in 1 bedside commode    Recommendations for Other Services       Precautions / Restrictions Precautions Precautions: Fall Restrictions Weight Bearing Restrictions: No      Mobility Bed Mobility Overal bed mobility: Modified Independent             General bed mobility comments: use of rails    Transfers Overall transfer level: Needs assistance Equipment used: Rolling walker (2 wheeled) Transfers: Sit to/from Stand Sit to Stand: Min guard              Balance Overall balance assessment: Needs assistance Sitting-balance support: Feet supported Sitting balance-Leahy Scale: Good     Standing balance support: No upper extremity supported;During functional activity Standing balance-Leahy Scale: Good                             ADL either performed or assessed with clinical judgement   ADL Overall ADL's : Needs assistance/impaired                                       General ADL Comments: SUPERVISION  + RW for toilet t/f (follows instructions to rest RUE lightly on walker for remainder of morning). MOD I for LB access seated EOB. SUPERVISION grooming standing sink side      Pertinent Vitals/Pain Pain Assessment: No/denies pain     Hand Dominance Right   Extremity/Trunk Assessment Upper Extremity Assessment Upper Extremity Assessment: Overall WFL for tasks assessed   Lower Extremity Assessment Lower Extremity Assessment: Generalized weakness       Communication Communication Communication: HOH   Cognition Arousal/Alertness: Awake/alert Behavior During Therapy: WFL for tasks assessed/performed Overall Cognitive Status: Within Functional Limits for tasks assessed                                     General Comments       Exercises Exercises: Other exercises Other Exercises Other Exercises: LBD, toileting, grooming, sup<>sit, sit<>stand, sitting/standing balance/tolerance, ~50 ft mobility Other Exercises: Pt educated re; OT role, DME recs, d/c recs, falls prevention, ECS   Shoulder Instructions      Home Living Family/patient expects to be discharged to:: Private residence [facility]) Living Arrangements: Spouse/significant other Available Help at Discharge: Family;Available 24 hours/day Type of Home: Independent living facility Home Access: Level entry  Home Layout: One level               Home Equipment: Walker - 2 wheels;Cane - single point          Prior Functioning/Environment Level of Independence: Independent with assistive device(s)        Comments: uses 3 point cane for mobility        OT Problem List: Decreased activity tolerance         OT Goals(Current goals can be found in the care plan section) Acute Rehab OT Goals Patient Stated Goal: to go back to [facility] OT Goal Formulation: With patient Time For Goal Achievement: 02/10/21 Potential to Achieve Goals: Good   AM-PAC OT "6 Clicks" Daily Activity      Outcome Measure Help from another person eating meals?: None Help from another person taking care of personal grooming?: None Help from another person toileting, which includes using toliet, bedpan, or urinal?: A Little Help from another person bathing (including washing, rinsing, drying)?: A Little Help from another person to put on and taking off regular upper body clothing?: None Help from another person to put on and taking off regular lower body clothing?: None 6 Click Score: 22   End of Session Equipment Utilized During Treatment: Rolling walker  Activity Tolerance: Patient tolerated treatment well Patient left: in chair;with call bell/phone within reach;with chair alarm set  OT Visit Diagnosis: Other abnormalities of gait and mobility (R26.89)                Time: [id number] OT Time Calculation (min): 29 min Charges:  OT General Charges $OT Visit: 1 Visit OT Evaluation $OT Eval Low Complexity: 1 Low OT Treatments $Self Care/Home Management : 23-37 mins  Dessie Coma, M.S. OTR/L  01/27/21, 9:46 AM  ascom [phone]

## 2021-01-27 NOTE — Progress Notes (Signed)
   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  Met with patient today, husband was also present.  Reinforced low-sodium diet, removing the saltshaker from the table, be aware of what seasonings are being used, weighing daily and what to report to physician.  Also discussed again reading labels.  Husband states that we both need to make changes, look at making homemade soups etc.  Discussed follow-up in the outpatient heart failure clinic.  Explained to the husband where it was located.  Patient states that she is staying through the weekend for cardioversion to be performed on Monday, instructed would check in on her at that time to see if she had any more questions.  Pricilla Riffle RN CHFN

## 2021-01-27 NOTE — Progress Notes (Signed)
Inpatient Diabetes Program Recommendations  AACE/ADA: New Consensus Statement on Inpatient Glycemic Control (2015)  Target Ranges:  Prepandial:   less than 140 mg/dL      Peak postprandial:   less than 180 mg/dL (1-2 hours)      Critically ill patients:  140 - 180 mg/dL   Lab Results  Component Value Date   GLUCAP 190 (H) 01/27/2021   HGBA1C 6.5 (H) 01/09/2021    Review of Glycemic Control Results for [name]" (MRN [id number]) as of 01/27/2021 15:12  Ref. Range 01/26/2021 08:04 01/26/2021 11:36 01/26/2021 16:09 01/26/2021 21:10 01/27/2021 07:37 01/27/2021 12:26  Glucose-Capillary Latest Ref Range: 70 - 99 mg/dL 172 (H) 208 (H) 182 (H) 266 (H) 188 (H) 190 (H)   Diabetes history: DM 2 Outpatient Diabetes medications: Bydureon 2 mg weekly, Humalog 4-12 units tid with meals, Lantus 22 units q HS Current orders for Inpatient glycemic control:  Novolog moderate tid with meals and HS Lantus 15 units q HS Inpatient Diabetes Program Recommendations:   Consider increasing Lantus to 18 units q HS.   Thanks, Adah Perl, RN, BC-ADM Inpatient Diabetes Coordinator Pager [phone] (8a-5p)

## 2021-01-27 NOTE — Progress Notes (Signed)
PROGRESS NOTE    Bonnie Rowland  [id number] DOB: 1940/08/15 DOA: 01/23/2021 PCP: Steele Sizer, MD   Assessment & Plan:   Principal Problem:   Hypertensive emergency Active Problems:   AF (paroxysmal atrial fibrillation) (HCC)   Type 2 diabetes mellitus with hyperlipidemia (HCC)   Acute respiratory failure with hypoxia (HCC)   Hypertensive urgency   Acute on chronic combined systolic and diastolic CHF (congestive heart failure) (HCC)   Elevated troponin   Acute hypoxic respiratory failure: initially requiring BiPAP but since has been weaned off.  Pulse ox of 77% on 2 L in the ER.  Weaned off of supplemental oxygen   Hypertensive urgency: resolved but still w/ HTN. Continue on losartan, metoprolol   Acute on chronic combined CHF: echo shows EF Q000111Q, diastolic function could not be evaluated. Monitor I/Os. Continue on metoprolol, losartan & lasix as per cardio    Elevated troponin: likely secondary to demand ischemia. 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 Continue on eliquis   PAF: had recent cardioversion and now in atrial fibrillation. Continue on amio, metoprolol & eliquis. Possible cardioversion on Monday    Hypokalemia: KCl repleated. Will check Mg level in AM  Hyponatremia: resolved   DM2: HbA1c 6.5, well controlled. Continue on glargine, SSI w/ accuchecks   HLD: continue on statin   Weakness: PT ordered  Lactic acidosis: on presentation.      DVT prophylaxis: IV heparin  Code Status: full  Family Communication:  Disposition Plan: likely d/c back home   Level of care: Progressive Cardiac  Status is: Inpatient  Remains inpatient appropriate because:Ongoing diagnostic testing needed not appropriate for outpatient work up, Unsafe d/c plan, IV treatments appropriate due to intensity of illness or inability to take PO, and Inpatient level of  care appropriate due to severity of illness  Dispo: The patient is from: Home              Anticipated d/c is to: Home              Patient currently is not medically stable to d/c.   Difficult to place patient: unclear    Consultants:  Cardio   Procedures:   Antimicrobials:   Subjective: Pt c/o fatigue   Objective: Vitals:   01/26/21 1924 01/27/21 0436 01/27/21 0736 01/27/21 1208  BP: 117/76 104/64 (!) 120/55 (!) 119/55  Pulse: 98 93 64 93  Resp: '17 18 18 17  '$ Temp: 97.8 F (36.6 C) 98.6 F (37 C) 98.2 F (36.8 C) 97.8 F (36.6 C)  TempSrc: Oral Oral Oral   SpO2: 96% 96% 97% 97%  Weight:  82.9 kg    Height:        Intake/Output Summary (Last 24 hours) at 01/27/2021 1425 Last data filed at 01/27/2021 1206 Gross per 24 hour  Intake 480 ml  Output 1450 ml  Net -970 ml   Filed Weights   01/25/21 0500 01/26/21 0500 01/27/21 0436  Weight: 87.9 kg 87.3 kg 82.9 kg    Examination:  General exam: Appears calm & comfortable  Respiratory system: diminished breath sounds b/l  Cardiovascular system: irregularly irregular. No clicks or rubs  Gastrointestinal system: Abd is soft, NT, & hypoactive bowel sounds  Central nervous system: Alert and oriented. Moves all extremities  Psychiatry: Judgement and insight appear normal. Flat mood and affect    Data Reviewed: I have personally reviewed following labs  and imaging studies  CBC: Recent Labs  Lab 01/22/21 2321 01/24/21 0419 01/25/21 0450 01/26/21 0112 01/27/21 0439  WBC 11.3* 15.8* 11.0* 11.2* 10.0  HGB 13.7 12.5 12.9 12.9 13.9  HCT 40.7 36.3 37.8 37.7 39.5  MCV 95.8 95.3 94.5 94.5 95.4  PLT 222 185 188 189 [id number]   Basic Metabolic Panel: Recent Labs  Lab 01/23/21 0413 01/24/21 0419 01/25/21 0450 01/26/21 0112 01/27/21 0439  NA 134* 132* 133* 134* 135  K 4.3 3.8 3.3* 3.4* 3.3*  CL 96* 95* 93* 94* 92*  CO2 28 30 32 36* 33*  GLUCOSE 284* 158* 139* 199* 204*  BUN '21 21 17 19 19  '$ CREATININE 0.88 0.79  0.69 0.81 0.71  CALCIUM 9.0 7.8* 7.7* 7.8* 8.0*   GFR: Estimated Creatinine Clearance: 59.1 mL/min (by C-G formula based on SCr of 0.71 mg/dL). Liver Function Tests: Recent Labs  Lab 01/23/21 0413  AST 42*  ALT 34  ALKPHOS 81  BILITOT 1.6*  PROT 7.1  ALBUMIN 4.0   No results for input(s): LIPASE, AMYLASE in the last 168 hours. No results for input(s): AMMONIA in the last 168 hours. Coagulation Profile: No results for input(s): INR, PROTIME in the last 168 hours. Cardiac Enzymes: No results for input(s): CKTOTAL, CKMB, CKMBINDEX, TROPONINI in the last 168 hours. BNP (last 3 results) No results for input(s): PROBNP in the last 8760 hours. HbA1C: No results for input(s): HGBA1C in the last 72 hours. CBG: Recent Labs  Lab 01/26/21 1136 01/26/21 1609 01/26/21 2110 01/27/21 0737 01/27/21 1226  GLUCAP 208* 182* 266* 188* 190*   Lipid Profile: No results for input(s): CHOL, HDL, LDLCALC, TRIG, CHOLHDL, LDLDIRECT in the last 72 hours. Thyroid Function Tests: No results for input(s): TSH, T4TOTAL, FREET4, T3FREE, THYROIDAB in the last 72 hours. Anemia Panel: No results for input(s): VITAMINB12, FOLATE, FERRITIN, TIBC, IRON, RETICCTPCT in the last 72 hours. Sepsis Labs: Recent Labs  Lab 01/23/21 0413 01/23/21 0600 01/24/21 0419  PROCALCITON <0.10  --  0.21  LATICACIDVEN 3.0* 3.0*  --     Recent Results (from the past 240 hour(s))  SARS CORONAVIRUS 2 (TAT 6-24 HRS) Nasopharyngeal Nasopharyngeal Swab     Status: None   Collection Time: 01/23/21 12:43 AM   Specimen: Nasopharyngeal Swab  Result Value Ref Range Status   SARS Coronavirus 2 NEGATIVE NEGATIVE Final    Comment: (NOTE) SARS-CoV-2 target nucleic acids are NOT DETECTED.  The SARS-CoV-2 RNA is generally detectable in upper and lower respiratory specimens during the acute phase of infection. Negative results do not preclude SARS-CoV-2 infection, do not rule out co-infections with other pathogens, and should not  be used as the sole basis for treatment or other patient management decisions. Negative results must be combined with clinical observations, patient history, and epidemiological information. The expected result is Negative.  Fact Sheet for Patients: SugarRoll.be  Fact Sheet for Healthcare Providers: https://www.woods-mathews.com/  This test is not yet approved or cleared by the [place] FDA and  has been authorized for detection and/or diagnosis of SARS-CoV-2 by FDA under an Emergency Use Authorization (EUA). This EUA will remain  in effect (meaning this test can be used) for the duration of the COVID-19 declaration under Se ction 564(b)(1) of the Act, 21 U.S.C. section 360bbb-3(b)(1), unless the authorization is terminated or revoked sooner.  Performed at [facility], [place] [address], [place], [place] [address]   Culture, blood (routine x 2)     Status: None (Preliminary result)  Collection Time: 01/23/21  4:02 AM   Specimen: BLOOD  Result Value Ref Range Status   Specimen Description BLOOD LEFT [facility]  Final   Special Requests   Final    BOTTLES DRAWN AEROBIC AND ANAEROBIC Blood Culture adequate volume   Culture   Final    NO GROWTH 3 DAYS Performed at [facility], [address], [place], [place] [address]    Report Status PENDING  Incomplete  Culture, blood (routine x 2)     Status: None (Preliminary result)   Collection Time: 01/23/21  4:13 AM   Specimen: BLOOD  Result Value Ref Range Status   Specimen Description BLOOD LEFT HAND  Final   Special Requests   Final    BOTTLES DRAWN AEROBIC AND ANAEROBIC Blood Culture adequate volume   Culture   Final    NO GROWTH 3 DAYS Performed at [facility], [address], [place], [place] [address]    Report Status PENDING  Incomplete         Radiology Studies: No results found.      Scheduled Meds:  acidophilus  1 capsule Oral QHS   amiodarone   400 mg Oral BID   apixaban  5 mg Oral BID   furosemide  40 mg Intravenous BID   insulin aspart  0-15 Units Subcutaneous TID WC   insulin aspart  0-5 Units Subcutaneous QHS   insulin glargine  15 Units Subcutaneous QHS   losartan  12.5 mg Oral Daily   metoprolol tartrate  50 mg Oral BID   multivitamin-lutein  1 capsule Oral BID   [START ON 01/28/2021] potassium chloride  40 mEq Oral Daily   rosuvastatin  10 mg Oral QHS   sodium chloride flush  3 mL Intravenous Q12H   tiZANidine  2 mg Oral QHS   Continuous Infusions:  sodium chloride       LOS: 4 days    Time spent: 25 mins    Wyvonnia Dusky, MD [facility] Pager [phone]  If 7PM-7AM, please contact night-coverage 01/27/2021, 2:25 PM

## 2021-01-28 ENCOUNTER — Encounter: Payer: Self-pay | Admitting: Internal Medicine

## 2021-01-28 DIAGNOSIS — I480 Paroxysmal atrial fibrillation: Secondary | ICD-10-CM | POA: Diagnosis not present

## 2021-01-28 DIAGNOSIS — J810 Acute pulmonary edema: Secondary | ICD-10-CM | POA: Diagnosis not present

## 2021-01-28 DIAGNOSIS — J9601 Acute respiratory failure with hypoxia: Secondary | ICD-10-CM | POA: Diagnosis not present

## 2021-01-28 DIAGNOSIS — E876 Hypokalemia: Secondary | ICD-10-CM | POA: Diagnosis not present

## 2021-01-28 DIAGNOSIS — I5043 Acute on chronic combined systolic (congestive) and diastolic (congestive) heart failure: Secondary | ICD-10-CM | POA: Diagnosis not present

## 2021-01-28 LAB — BASIC METABOLIC PANEL
Anion gap: 8 (ref 5–15)
BUN: 19 mg/dL (ref 8–23)
CO2: 32 mmol/L (ref 22–32)
Calcium: 7.8 mg/dL — ABNORMAL LOW (ref 8.9–10.3)
Chloride: 94 mmol/L — ABNORMAL LOW (ref 98–111)
Creatinine, Ser: 0.64 mg/dL (ref 0.44–1.00)
GFR, Estimated: >60 mL/min (ref >60)
Glucose, Bld: 186 mg/dL — ABNORMAL HIGH (ref 70–99)
Potassium: 3.4 mmol/L — ABNORMAL LOW (ref 3.5–5.1)
Sodium: 134 mmol/L — ABNORMAL LOW (ref 135–145)

## 2021-01-28 LAB — GLUCOSE, CAPILLARY
Glucose-Capillary: 190 mg/dL — ABNORMAL HIGH (ref 70–99)
Glucose-Capillary: 366 mg/dL — ABNORMAL HIGH (ref 70–99)
Glucose-Capillary: 149 mg/dL — ABNORMAL HIGH (ref 70–99)
Glucose-Capillary: 332 mg/dL — ABNORMAL HIGH (ref 70–99)

## 2021-01-28 LAB — MAGNESIUM: Magnesium: 2.1 mg/dL (ref 1.7–2.4)

## 2021-01-28 MED ORDER — METOPROLOL TARTRATE 50 MG PO TABS
75.0000 mg | ORAL_TABLET | Freq: Two times a day (BID) | ORAL | Status: DC
  Administered 2021-01-28 – 2021-01-29 (×3): 75 mg via ORAL
  Filled 2021-01-28 (×3): qty 1

## 2021-01-28 NOTE — Progress Notes (Signed)
Progress Note  Patient Name: Bonnie Rowland Date of Encounter: 01/28/2021  [facility] Cardiologist: Ida Rogue, MD   Subjective   Reports slow improvement in her respiratory status, On presentation needed BiPAP then transition to nasal cannula oxygen Now resting in bed on room air, with movement mild desaturations to 90% Still feels abdomen fullness consistent with fluid retention High fluid intake at home, salt  Inpatient Medications    Scheduled Meds:  acidophilus  1 capsule Oral QHS   amiodarone  400 mg Oral BID   apixaban  5 mg Oral BID   furosemide  40 mg Intravenous BID   insulin aspart  0-15 Units Subcutaneous TID WC   insulin aspart  0-5 Units Subcutaneous QHS   insulin glargine  18 Units Subcutaneous QHS   losartan  12.5 mg Oral Daily   metoprolol tartrate  50 mg Oral BID   multivitamin-lutein  1 capsule Oral BID   potassium chloride  40 mEq Oral Daily   rosuvastatin  10 mg Oral QHS   sodium chloride flush  3 mL Intravenous Q12H   tiZANidine  2 mg Oral QHS   Continuous Infusions:  sodium chloride     PRN Meds: sodium chloride, acetaminophen **OR** acetaminophen, fluticasone, ondansetron **OR** ondansetron (ZOFRAN) IV, polyvinyl alcohol, sodium chloride flush   Vital Signs    Vitals:   01/28/21 0355 01/28/21 0412 01/28/21 0500 01/28/21 0808  BP:  113/81 (!) 126/93 130/88  Pulse:  77 95 83  Resp:  18  15  Temp:  97.7 F (36.5 C) 97.6 F (36.4 C) 98.1 F (36.7 C)  TempSrc:  Oral  Oral  SpO2:  97% 97% 96%  Weight: 83.7 kg     Height:        Intake/Output Summary (Last 24 hours) at 01/28/2021 0903 Last data filed at 01/28/2021 0500 Gross per 24 hour  Intake 360 ml  Output 1350 ml  Net -990 ml   Last 3 Weights 01/28/2021 01/27/2021 01/26/2021  Weight (lbs) 184 lb 9.6 oz 182 lb 12.8 oz 192 lb 6.4 oz  Weight (kg) 83.734 kg 82.918 kg 87.272 kg      Telemetry    Atrial fibrillation rate in the 80s- Personally Reviewed  ECG    -  Personally Reviewed  Physical Exam   GEN: No acute distress.   Neck: Unable to estimate JVD Cardiac: Irregularly irregular no murmurs, rubs, or gallops.  Respiratory: Clear to auscultation bilaterally. GI: Soft, nontender, non-distended  MS: No edema; No deformity. Neuro:  Nonfocal  Psych: Normal affect   Labs    High Sensitivity Troponin:   Recent Labs  Lab 01/22/21 2321 01/23/21 0120 01/23/21 1722 01/24/21 0032  TROPONINIHS 10 87* 692* 481*      Chemistry Recent Labs  Lab 01/23/21 0413 01/24/21 0419 01/26/21 0112 01/27/21 0439 01/28/21 0536  NA 134*   < > 134* 135 134*  K 4.3   < > 3.4* 3.3* 3.4*  CL 96*   < > 94* 92* 94*  CO2 28   < > 36* 33* 32  GLUCOSE 284*   < > 199* 204* 186*  BUN 21   < > '19 19 19  '$ CREATININE 0.88   < > 0.81 0.71 0.64  CALCIUM 9.0   < > 7.8* 8.0* 7.8*  PROT 7.1  --   --   --   --   ALBUMIN 4.0  --   --   --   --  AST 42*  --   --   --   --   ALT 34  --   --   --   --   ALKPHOS 81  --   --   --   --   BILITOT 1.6*  --   --   --   --   GFRNONAA >60   < > >60 >60 >60  ANIONGAP 10   < > 4* 10 8   < > = values in this interval not displayed.     Hematology Recent Labs  Lab 01/25/21 0450 01/26/21 0112 01/27/21 0439  WBC 11.0* 11.2* 10.0  RBC 4.00 3.99 4.14  HGB 12.9 12.9 13.9  HCT 37.8 37.7 39.5  MCV 94.5 94.5 95.4  MCH 32.3 32.3 33.6  MCHC 34.1 34.2 35.2  RDW 13.0 13.0 12.9  PLT 188 189 202    BNP Recent Labs  Lab 01/22/21 2321  BNP 237.3*     DDimer No results for input(s): DDIMER in the last 168 hours.   Radiology    No results found.  Cardiac Studies  Cardiac catheterization performed January 25, 2021 Mild to moderate, non-obstructive coronary artery disease, including 30% mid LAD disease, 50-60% mid/distal LCx stenosis, and 60% proximal/mid RCA lesion. Mildly to moderately reduced left ventricular systolic function with mid anterior hypo/akinesis; query atypical Takotsubo variant.  LVEF ~45%. Mildly elevated  left heart filling pressures. Moderately elevated right heart filling and pulmonary artery pressures with significant pulmonary vascular resistance. Mildly reduced Fick cardiac output/index.    Patient Profile     80 y.o. female with history of normal coronary arteries by [facility] in 09/2005, persistent A. Fib status post DCCV on 11/30/2020 with recurrent Afib s/p repeat DCCV on amiodarone 01/20/2021, postoperative atrial flutter in 01/2015, less than 50% left renal artery stenosis by imaging in 2021, 1 to 39% bilateral ICA carotid stenosis by imaging in 2017, DM2, HTN, HLD, and obesity who is being seen today for the evaluation of acute hypoxic respiratory failure  Assessment & Plan    1.  Acute hypoxic respiratory failure: Flash pulmonary edema in the setting of hypertension, acute on chronic diastolic  Steady improvement through her hospital course, stable renal function, still with mild abdominal distention 8 L negative Continue IV Lasix twice daily through the weekend Cardioversion planned Monday   2. Hypertensive urgency with RAS: -Prior renal artery ultrasound in 08/2019 showed < 50% left RAS continue Lopressor, losartan, and IV Lasix Will increase metoprolol to tartrate up to 75 twice daily   3.  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 Persistent Afib/flutter: -Status post recent briefly successful DCCV in 11/2020 with repeat briefly successful DCCV on amiodarone 01/20/2021 Admitted to the hospital July 24, normal sinus rhythm at that time, back into atrial fibrillation July 25 -CHA2DS2-VASc at least 6 (CHF, HTN, age x2, DM2, sex category) Back on Eliquis 5 twice daily, -Discussed plan for cardioversion Monday morning  5.  HLD: -LDL 38 in 07/2020, continue Crestor   Total  encounter time more than 25 minutes  Greater than 50% was spent in counseling and coordination of care with the patient   For questions or updates, please contact [place] Please consult [email or web address] for contact info under        Signed, Ida Rogue, MD  01/28/2021, 9:03 AM

## 2021-01-28 NOTE — Progress Notes (Signed)
PROGRESS NOTE    Bonnie Rowland  [id number] DOB: 08-25-1940 DOA: 01/23/2021 PCP: Steele Sizer, MD   Assessment & Plan:   Principal Problem:   Hypertensive emergency Active Problems:   AF (paroxysmal atrial fibrillation) (HCC)   Type 2 diabetes mellitus with hyperlipidemia (HCC)   Acute respiratory failure with hypoxia (HCC)   Hypertensive urgency   Acute on chronic combined systolic and diastolic CHF (congestive heart failure) (HCC)   Elevated troponin   Acute hypoxic respiratory failure: initially requiring BiPAP but since has been weaned off.  Pulse ox of 77% on 2 L in the ER.  Resolved   Hypertensive urgency: resolved but still w/ HTN. Continue on metoprolol, losartan   Acute on chronic combined CHF: echo shows EF Q000111Q, diastolic function could not be evaluated. Monitor I/Os. Neg approx 1 L. Continue on metoprolol, losartan & lasix as per cardio    Elevated troponin: likely secondary to demand ischemia. 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 Continue on eliquis   PAF: had recent cardioversion and now in atrial fibrillation. Continue on eliquis, metoprolol & amiodarone. Possible cardioversion on 01/30/21  Hypokalemia: potassium given. Mg is WNL   Hyponatremia: resolved   DM2: well controlled, HbA1c 6.5. Continue on glargine, SSI w/ accuchecks   HLD: continue on statin   Weakness: PT recs home health   Lactic acidosis: on presentation.      DVT prophylaxis: eliquis  Code Status: full  Family Communication:  Disposition Plan: likely d/c back home   Level of care: Progressive Cardiac  Status is: Inpatient  Remains inpatient appropriate because:Ongoing diagnostic testing needed not appropriate for outpatient work up, Unsafe d/c plan, IV treatments appropriate due to intensity of illness or inability to take PO, and Inpatient level of care  appropriate due to severity of illness  Dispo: The patient is from: Home              Anticipated d/c is to: Home              Patient currently is not medically stable to d/c.   Difficult to place patient: unclear    Consultants:  Cardio   Procedures:   Antimicrobials:   Subjective: Pt c/o malaise   Objective: Vitals:   01/28/21 0412 01/28/21 0500 01/28/21 0808 01/28/21 1134  BP: 113/81 (!) 126/93 130/88 117/81  Pulse: 77 95 83 88  Resp: '18  15 15  '$ Temp: 97.7 F (36.5 C) 97.6 F (36.4 C) 98.1 F (36.7 C) 98.8 F (37.1 C)  TempSrc: Oral  Oral Oral  SpO2: 97% 97% 96% 95%  Weight:      Height:        Intake/Output Summary (Last 24 hours) at 01/28/2021 1407 Last data filed at 01/28/2021 1025 Gross per 24 hour  Intake 240 ml  Output 1250 ml  Net -1010 ml   Filed Weights   01/26/21 0500 01/27/21 0436 01/28/21 0355  Weight: 87.3 kg 82.9 kg 83.7 kg    Examination:  General exam: Appears comfortable   Respiratory system: decreased breath sounds b/l  Cardiovascular system: irregularly irregular. No rubs or clicks  Gastrointestinal system: Abd is soft, NT, obese & hypoactive bowel sounds  Central nervous system: Alert and oriented. Moves all extremities  Psychiatry: Judgement and insight appear normal. Appropriate mood and affect    Data Reviewed: I have personally reviewed following labs and imaging studies  CBC: Recent Labs  Lab 01/22/21 2321 01/24/21 0419 01/25/21 0450 01/26/21 0112 01/27/21 0439  WBC 11.3* 15.8* 11.0* 11.2* 10.0  HGB 13.7 12.5 12.9 12.9 13.9  HCT 40.7 36.3 37.8 37.7 39.5  MCV 95.8 95.3 94.5 94.5 95.4  PLT 222 185 188 189 [id number]   Basic Metabolic Panel: Recent Labs  Lab 01/24/21 0419 01/25/21 0450 01/26/21 0112 01/27/21 0439 01/28/21 0536  NA 132* 133* 134* 135 134*  K 3.8 3.3* 3.4* 3.3* 3.4*  CL 95* 93* 94* 92* 94*  CO2 30 32 36* 33* 32  GLUCOSE 158* 139* 199* 204* 186*  BUN '21 17 19 19 19  '$ CREATININE 0.79 0.69 0.81 0.71  0.64  CALCIUM 7.8* 7.7* 7.8* 8.0* 7.8*  MG  --   --   --   --  2.1   GFR: Estimated Creatinine Clearance: 59.4 mL/min (by C-G formula based on SCr of 0.64 mg/dL). Liver Function Tests: Recent Labs  Lab 01/23/21 0413  AST 42*  ALT 34  ALKPHOS 81  BILITOT 1.6*  PROT 7.1  ALBUMIN 4.0   No results for input(s): LIPASE, AMYLASE in the last 168 hours. No results for input(s): AMMONIA in the last 168 hours. Coagulation Profile: No results for input(s): INR, PROTIME in the last 168 hours. Cardiac Enzymes: No results for input(s): CKTOTAL, CKMB, CKMBINDEX, TROPONINI in the last 168 hours. BNP (last 3 results) No results for input(s): PROBNP in the last 8760 hours. HbA1C: No results for input(s): HGBA1C in the last 72 hours. CBG: Recent Labs  Lab 01/27/21 1226 01/27/21 1653 01/27/21 2247 01/28/21 0809 01/28/21 1134  GLUCAP 190* 184* 211* 190* 366*   Lipid Profile: No results for input(s): CHOL, HDL, LDLCALC, TRIG, CHOLHDL, LDLDIRECT in the last 72 hours. Thyroid Function Tests: No results for input(s): TSH, T4TOTAL, FREET4, T3FREE, THYROIDAB in the last 72 hours. Anemia Panel: No results for input(s): VITAMINB12, FOLATE, FERRITIN, TIBC, IRON, RETICCTPCT in the last 72 hours. Sepsis Labs: Recent Labs  Lab 01/23/21 0413 01/23/21 0600 01/24/21 0419  PROCALCITON <0.10  --  0.21  LATICACIDVEN 3.0* 3.0*  --     Recent Results (from the past 240 hour(s))  SARS CORONAVIRUS 2 (TAT 6-24 HRS) Nasopharyngeal Nasopharyngeal Swab     Status: None   Collection Time: 01/23/21 12:43 AM   Specimen: Nasopharyngeal Swab  Result Value Ref Range Status   SARS Coronavirus 2 NEGATIVE NEGATIVE Final    Comment: (NOTE) SARS-CoV-2 target nucleic acids are NOT DETECTED.  The SARS-CoV-2 RNA is generally detectable in upper and lower respiratory specimens during the acute phase of infection. Negative results do not preclude SARS-CoV-2 infection, do not rule out co-infections with other  pathogens, and should not be used as the sole basis for treatment or other patient management decisions. Negative results must be combined with clinical observations, patient history, and epidemiological information. The expected result is Negative.  Fact Sheet for Patients: SugarRoll.be  Fact Sheet for Healthcare Providers: https://www.woods-mathews.com/  This test is not yet approved or cleared by the [place] FDA and  has been authorized for detection and/or diagnosis of SARS-CoV-2 by FDA under an Emergency Use Authorization (EUA). This EUA will remain  in effect (meaning this test can be used) for the duration of the COVID-19 declaration under Se ction 564(b)(1) of the Act, 21 U.S.C. section 360bbb-3(b)(1), unless the authorization is terminated or revoked sooner.  Performed at [facility], [place] [address], [place], [place] [address]   Culture, blood (routine x  2)     Status: None (Preliminary result)   Collection Time: 01/23/21  4:02 AM   Specimen: BLOOD  Result Value Ref Range Status   Specimen Description BLOOD LEFT [facility]  Final   Special Requests   Final    BOTTLES DRAWN AEROBIC AND ANAEROBIC Blood Culture adequate volume   Culture   Final    NO GROWTH 4 DAYS Performed at [facility], [address], [place], [place] [address]    Report Status PENDING  Incomplete  Culture, blood (routine x 2)     Status: None (Preliminary result)   Collection Time: 01/23/21  4:13 AM   Specimen: BLOOD  Result Value Ref Range Status   Specimen Description BLOOD LEFT HAND  Final   Special Requests   Final    BOTTLES DRAWN AEROBIC AND ANAEROBIC Blood Culture adequate volume   Culture   Final    NO GROWTH 4 DAYS Performed at [facility], [address], [place], [place] [address]    Report Status PENDING  Incomplete         Radiology Studies: No results found.      Scheduled Meds:  acidophilus  1  capsule Oral QHS   amiodarone  400 mg Oral BID   apixaban  5 mg Oral BID   furosemide  40 mg Intravenous BID   insulin aspart  0-15 Units Subcutaneous TID WC   insulin aspart  0-5 Units Subcutaneous QHS   insulin glargine  18 Units Subcutaneous QHS   losartan  12.5 mg Oral Daily   metoprolol tartrate  75 mg Oral BID   multivitamin-lutein  1 capsule Oral BID   potassium chloride  40 mEq Oral Daily   rosuvastatin  10 mg Oral QHS   sodium chloride flush  3 mL Intravenous Q12H   tiZANidine  2 mg Oral QHS   Continuous Infusions:  sodium chloride       LOS: 5 days    Time spent: 20 mins    Wyvonnia Dusky, MD [facility] Pager [phone]  If 7PM-7AM, please contact night-coverage 01/28/2021, 2:07 PM

## 2021-01-29 DIAGNOSIS — I5043 Acute on chronic combined systolic (congestive) and diastolic (congestive) heart failure: Secondary | ICD-10-CM | POA: Diagnosis not present

## 2021-01-29 DIAGNOSIS — J9601 Acute respiratory failure with hypoxia: Secondary | ICD-10-CM | POA: Diagnosis not present

## 2021-01-29 DIAGNOSIS — E876 Hypokalemia: Secondary | ICD-10-CM | POA: Diagnosis not present

## 2021-01-29 DIAGNOSIS — I480 Paroxysmal atrial fibrillation: Secondary | ICD-10-CM | POA: Diagnosis not present

## 2021-01-29 DIAGNOSIS — J810 Acute pulmonary edema: Secondary | ICD-10-CM | POA: Diagnosis not present

## 2021-01-29 LAB — MAGNESIUM: Magnesium: 2 mg/dL (ref 1.7–2.4)

## 2021-01-29 LAB — BASIC METABOLIC PANEL
Anion gap: 11 (ref 5–15)
BUN: 18 mg/dL (ref 8–23)
CO2: 30 mmol/L (ref 22–32)
Calcium: 8.2 mg/dL — ABNORMAL LOW (ref 8.9–10.3)
Chloride: 95 mmol/L — ABNORMAL LOW (ref 98–111)
Creatinine, Ser: 0.65 mg/dL (ref 0.44–1.00)
GFR, Estimated: >60 mL/min (ref >60)
Glucose, Bld: 167 mg/dL — ABNORMAL HIGH (ref 70–99)
Potassium: 3.5 mmol/L (ref 3.5–5.1)
Sodium: 136 mmol/L (ref 135–145)

## 2021-01-29 LAB — GLUCOSE, CAPILLARY
Glucose-Capillary: 188 mg/dL — ABNORMAL HIGH (ref 70–99)
Glucose-Capillary: 224 mg/dL — ABNORMAL HIGH (ref 70–99)
Glucose-Capillary: 207 mg/dL — ABNORMAL HIGH (ref 70–99)
Glucose-Capillary: 216 mg/dL — ABNORMAL HIGH (ref 70–99)

## 2021-01-29 MED ORDER — POTASSIUM CHLORIDE CRYS ER 20 MEQ PO TBCR
40.0000 meq | EXTENDED_RELEASE_TABLET | Freq: Two times a day (BID) | ORAL | Status: DC
  Administered 2021-01-29 – 2021-01-31 (×4): 40 meq via ORAL
  Filled 2021-01-29 (×4): qty 2

## 2021-01-29 MED ORDER — METOPROLOL TARTRATE 50 MG PO TABS
50.0000 mg | ORAL_TABLET | Freq: Two times a day (BID) | ORAL | Status: DC
  Administered 2021-01-29 – 2021-01-30 (×2): 50 mg via ORAL
  Filled 2021-01-29 (×2): qty 1

## 2021-01-29 MED ORDER — GUAIFENESIN ER 600 MG PO TB12: 600.0000 mg | ORAL_TABLET | Freq: Two times a day (BID) | ORAL | Status: DC | PRN

## 2021-01-29 MED ORDER — FUROSEMIDE 10 MG/ML IJ SOLN
40.0000 mg | Freq: Three times a day (TID) | INTRAMUSCULAR | Status: DC
  Administered 2021-01-29 – 2021-01-30 (×3): 40 mg via INTRAVENOUS
  Filled 2021-01-29 (×3): qty 4

## 2021-01-29 NOTE — Progress Notes (Signed)
PROGRESS NOTE    Bonnie Rowland  [id number] DOB: 1940/08/19 DOA: 01/23/2021 PCP: Steele Sizer, MD   Assessment & Plan:   Principal Problem:   Hypertensive emergency Active Problems:   AF (paroxysmal atrial fibrillation) (HCC)   Type 2 diabetes mellitus with hyperlipidemia (HCC)   Acute respiratory failure with hypoxia (HCC)   Hypertensive urgency   Acute on chronic combined systolic and diastolic CHF (congestive heart failure) (HCC)   Elevated troponin   Acute hypoxic respiratory failure: initially requiring BiPAP but since has been weaned off.  Pulse ox of 77% on 2 L in the ER.  Resolved   Hypertensive urgency: resolved but still w/ HTN. Continue on ARB, BB  Acute on chronic combined CHF: echo shows EF [id number], diastolic function could not be evaluated. Monitor I/Os. Continue on lasix, ARB & BB as per cardio   Elevated troponin: likely secondary to demand ischemia. 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 Continue on eliquis   PAF: had recent cardioversion and now in atrial fibrillation. Continue on eliquis, metoprolol & amiodarone.Will go for cardioversion on 01/30/21  Hypokalemia: WNL today    Hyponatremia: resolved   DM2: HbA1c 6.5, well controlled. Continue on glargine, SSI w/ accuchecks   HLD:  continue on statin   Weakness: PT recs home health   Lactic acidosis: on presentation.      DVT prophylaxis: eliquis  Code Status: full  Family Communication:  Disposition Plan: likely d/c back home   Level of care: Progressive Cardiac  Status is: Inpatient  Remains inpatient appropriate because:Ongoing diagnostic testing needed not appropriate for outpatient work up, Unsafe d/c plan, IV treatments appropriate due to intensity of illness or inability to take PO, and Inpatient level of care appropriate due to severity of illness  Dispo: The patient is  from: Home              Anticipated d/c is to: Home              Patient currently is not medically stable to d/c.   Difficult to place patient: unclear    Consultants:  Cardio   Procedures:   Antimicrobials:   Subjective: Pt c/o fatigue  Objective: Vitals:   01/29/21 0500 01/29/21 0526 01/29/21 0802 01/29/21 1137  BP:  128/60 (!) 107/58 108/73  Pulse:  60 60 (!) 44  Resp:  '17 17 17  '$ Temp:  97.6 F (36.4 C) 98.2 F (36.8 C) 98.2 F (36.8 C)  TempSrc:      SpO2:  99% 99% 97%  Weight: 83.6 kg     Height:        Intake/Output Summary (Last 24 hours) at 01/29/2021 1201 Last data filed at 01/29/2021 0940 Gross per 24 hour  Intake 723 ml  Output --  Net 723 ml   Filed Weights   01/27/21 0436 01/28/21 0355 01/29/21 0500  Weight: 82.9 kg 83.7 kg 83.6 kg    Examination:  General exam: Appears calm & comfortable  Respiratory system: diminished breath sounds b/l  Cardiovascular system: irregularly irregular. No rubs or clicks Gastrointestinal system: Abd is soft, NT, & hypoactive bowel sounds  Central nervous system: Alert and oriented. Moves all extremities  Psychiatry: Judgement and insight appear normal. Appropriate mood and affect    Data Reviewed: I have personally reviewed following labs and imaging studies  CBC: Recent Labs  Lab 01/22/21 2321 01/24/21 0419 01/25/21  [id number] 01/26/21 0112 01/27/21 0439  WBC 11.3* 15.8* 11.0* 11.2* 10.0  HGB 13.7 12.5 12.9 12.9 13.9  HCT 40.7 36.3 37.8 37.7 39.5  MCV 95.8 95.3 94.5 94.5 95.4  PLT 222 185 188 189 [id number]   Basic Metabolic Panel: Recent Labs  Lab 01/25/21 0450 01/26/21 0112 01/27/21 0439 01/28/21 0536 01/29/21 0659  NA 133* 134* 135 134* 136  K 3.3* 3.4* 3.3* 3.4* 3.5  CL 93* 94* 92* 94* 95*  CO2 32 36* 33* 32 30  GLUCOSE 139* 199* 204* 186* 167*  BUN '17 19 19 19 18  '$ CREATININE 0.69 0.81 0.71 0.64 0.65  CALCIUM 7.7* 7.8* 8.0* 7.8* 8.2*  MG  --   --   --  2.1 2.0   GFR: Estimated Creatinine  Clearance: 59.4 mL/min (by C-G formula based on SCr of 0.65 mg/dL). Liver Function Tests: Recent Labs  Lab 01/23/21 0413  AST 42*  ALT 34  ALKPHOS 81  BILITOT 1.6*  PROT 7.1  ALBUMIN 4.0   No results for input(s): LIPASE, AMYLASE in the last 168 hours. No results for input(s): AMMONIA in the last 168 hours. Coagulation Profile: No results for input(s): INR, PROTIME in the last 168 hours. Cardiac Enzymes: No results for input(s): CKTOTAL, CKMB, CKMBINDEX, TROPONINI in the last 168 hours. BNP (last 3 results) No results for input(s): PROBNP in the last 8760 hours. HbA1C: No results for input(s): HGBA1C in the last 72 hours. CBG: Recent Labs  Lab 01/28/21 1134 01/28/21 1616 01/28/21 2214 01/29/21 0803 01/29/21 1138  GLUCAP 366* 149* 332* 188* 224*   Lipid Profile: No results for input(s): CHOL, HDL, LDLCALC, TRIG, CHOLHDL, LDLDIRECT in the last 72 hours. Thyroid Function Tests: No results for input(s): TSH, T4TOTAL, FREET4, T3FREE, THYROIDAB in the last 72 hours. Anemia Panel: No results for input(s): VITAMINB12, FOLATE, FERRITIN, TIBC, IRON, RETICCTPCT in the last 72 hours. Sepsis Labs: Recent Labs  Lab 01/23/21 0413 01/23/21 0600 01/24/21 0419  PROCALCITON <0.10  --  0.21  LATICACIDVEN 3.0* 3.0*  --     Recent Results (from the past 240 hour(s))  SARS CORONAVIRUS 2 (TAT 6-24 HRS) Nasopharyngeal Nasopharyngeal Swab     Status: None   Collection Time: 01/23/21 12:43 AM   Specimen: Nasopharyngeal Swab  Result Value Ref Range Status   SARS Coronavirus 2 NEGATIVE NEGATIVE Final    Comment: (NOTE) SARS-CoV-2 target nucleic acids are NOT DETECTED.  The SARS-CoV-2 RNA is generally detectable in upper and lower respiratory specimens during the acute phase of infection. Negative results do not preclude SARS-CoV-2 infection, do not rule out co-infections with other pathogens, and should not be used as the sole basis for treatment or other patient management  decisions. Negative results must be combined with clinical observations, patient history, and epidemiological information. The expected result is Negative.  Fact Sheet for Patients: SugarRoll.be  Fact Sheet for Healthcare Providers: https://www.woods-mathews.com/  This test is not yet approved or cleared by the [place] FDA and  has been authorized for detection and/or diagnosis of SARS-CoV-2 by FDA under an Emergency Use Authorization (EUA). This EUA will remain  in effect (meaning this test can be used) for the duration of the COVID-19 declaration under Se ction 564(b)(1) of the Act, 21 U.S.C. section 360bbb-3(b)(1), unless the authorization is terminated or revoked sooner.  Performed at [facility], [place] [address] [place], [place] [address]   Culture, blood (routine x 2)     Status: None (Preliminary result)   Collection  Time: 01/23/21  4:02 AM   Specimen: BLOOD  Result Value Ref Range Status   Specimen Description BLOOD LEFT [facility]  Final   Special Requests   Final    BOTTLES DRAWN AEROBIC AND ANAEROBIC Blood Culture adequate volume   Culture   Final    NO GROWTH 4 DAYS Performed at [facility], [address], [place], [place] [address]    Report Status PENDING  Incomplete  Culture, blood (routine x 2)     Status: None (Preliminary result)   Collection Time: 01/23/21  4:13 AM   Specimen: BLOOD  Result Value Ref Range Status   Specimen Description BLOOD LEFT HAND  Final   Special Requests   Final    BOTTLES DRAWN AEROBIC AND ANAEROBIC Blood Culture adequate volume   Culture   Final    NO GROWTH 4 DAYS Performed at [facility], [address], [place], [place] [address]    Report Status PENDING  Incomplete         Radiology Studies: No results found.      Scheduled Meds:  acidophilus  1 capsule Oral QHS   amiodarone  400 mg Oral BID   apixaban  5 mg Oral BID   furosemide  40 mg  Intravenous BID   insulin aspart  0-15 Units Subcutaneous TID WC   insulin aspart  0-5 Units Subcutaneous QHS   insulin glargine  18 Units Subcutaneous QHS   losartan  12.5 mg Oral Daily   metoprolol tartrate  75 mg Oral BID   multivitamin-lutein  1 capsule Oral BID   potassium chloride  40 mEq Oral Daily   rosuvastatin  10 mg Oral QHS   sodium chloride flush  3 mL Intravenous Q12H   tiZANidine  2 mg Oral QHS   Continuous Infusions:  sodium chloride       LOS: 6 days    Time spent: 20 mins    Wyvonnia Dusky, MD [facility] Pager [phone]  If 7PM-7AM, please contact night-coverage 01/29/2021, 12:01 PM

## 2021-01-29 NOTE — Progress Notes (Addendum)
Progress Note  Patient Name: Bonnie Rowland Date of Encounter: 01/29/2021  Primary Cardiologist: Ida Rogue, MD  Subjective   Feels as though breathing and abdominal girth continues to improve.  No chest pain or palpitations.  Eager for cardioversion tomorrow and hoping on discharge early this week.  Inpatient Medications    Scheduled Meds:  acidophilus  1 capsule Oral QHS   amiodarone  400 mg Oral BID   apixaban  5 mg Oral BID   furosemide  40 mg Intravenous BID   insulin aspart  0-15 Units Subcutaneous TID WC   insulin aspart  0-5 Units Subcutaneous QHS   insulin glargine  18 Units Subcutaneous QHS   losartan  12.5 mg Oral Daily   metoprolol tartrate  75 mg Oral BID   multivitamin-lutein  1 capsule Oral BID   potassium chloride  40 mEq Oral Daily   rosuvastatin  10 mg Oral QHS   sodium chloride flush  3 mL Intravenous Q12H   tiZANidine  2 mg Oral QHS   Continuous Infusions:  sodium chloride     PRN Meds: sodium chloride, acetaminophen **OR** acetaminophen, fluticasone, ondansetron **OR** ondansetron (ZOFRAN) IV, polyvinyl alcohol, sodium chloride flush   Vital Signs    Vitals:   01/28/21 2005 01/29/21 0500 01/29/21 0526 01/29/21 0802  BP: 123/68  128/60 (!) 107/58  Pulse: 80  60 60  Resp: '16  17 17  '$ Temp: 98.5 F (36.9 C)  97.6 F (36.4 C) 98.2 F (36.8 C)  TempSrc:      SpO2: 98%  99% 99%  Weight:  83.6 kg    Height:        Intake/Output Summary (Last 24 hours) at 01/29/2021 0848 Last data filed at 01/28/2021 2332 Gross per 24 hour  Intake 723 ml  Output 800 ml  Net -77 ml   Filed Weights   01/27/21 0436 01/28/21 0355 01/29/21 0500  Weight: 82.9 kg 83.7 kg 83.6 kg    Physical Exam   GEN: Well nourished, well developed, in no acute distress.  HEENT: Grossly normal.  Neck: Supple, mildly elevated JVD.  No carotid bruits, or masses. Cardiac: Irregularly irregular, no murmurs, rubs, or gallops. No clubbing, cyanosis, edema.  Lower  extremities are tender to palpation.  Radials 2+, DP/PT 1+ and equal bilaterally.  Respiratory:  Respirations regular and unlabored, diminished breath sounds at bilateral bases. GI: Obese, relatively soft, nontender, nondistended, BS + x 4. MS: no deformity or atrophy. Skin: warm and dry, no rash. Neuro:  Strength and sensation are intact. Psych: AAOx3.  Normal affect.  Labs    Chemistry Recent Labs  Lab 01/23/21 0413 01/24/21 0419 01/27/21 0439 01/28/21 0536 01/29/21 0659  NA 134*   < > 135 134* 136  K 4.3   < > 3.3* 3.4* 3.5  CL 96*   < > 92* 94* 95*  CO2 28   < > 33* 32 30  GLUCOSE 284*   < > 204* 186* 167*  BUN 21   < > '19 19 18  '$ CREATININE 0.88   < > 0.71 0.64 0.65  CALCIUM 9.0   < > 8.0* 7.8* 8.2*  PROT 7.1  --   --   --   --   ALBUMIN 4.0  --   --   --   --   AST 42*  --   --   --   --   ALT 34  --   --   --   --  ALKPHOS 81  --   --   --   --   BILITOT 1.6*  --   --   --   --   GFRNONAA >60   < > >60 >60 >60  ANIONGAP 10   < > '10 8 11   '$ < > = values in this interval not displayed.     Hematology Recent Labs  Lab 01/25/21 0450 01/26/21 0112 01/27/21 0439  WBC 11.0* 11.2* 10.0  RBC 4.00 3.99 4.14  HGB 12.9 12.9 13.9  HCT 37.8 37.7 39.5  MCV 94.5 94.5 95.4  MCH 32.3 32.3 33.6  MCHC 34.1 34.2 35.2  RDW 13.0 13.0 12.9  PLT 188 189 202    Cardiac Enzymes  Recent Labs  Lab 01/22/21 2321 01/23/21 0120 01/23/21 1722 01/24/21 0032  TROPONINIHS 10 87* 692* 481*      BNP Recent Labs  Lab 01/22/21 2321  BNP 237.3*    Lipids  Lab Results  Component Value Date   CHOL 130 07/12/2020   HDL 75 07/12/2020   LDLCALC 38 07/12/2020   TRIG 86 07/12/2020   CHOLHDL 1.7 07/12/2020    HbA1c  Lab Results  Component Value Date   HGBA1C 6.5 (H) 01/09/2021    Radiology    -----------  Telemetry    Atrial fibrillation, predominantly 70s to 80s- Personally Reviewed  Cardiac Studies   2D Echocardiogram 7.25.2022   1. Left ventricular ejection  fraction, by estimation, is 45 to 50%. The  left ventricle has mildly decreased function. The left ventricle  demonstrates regional wall motion abnormalities (see scoring  diagram/findings for description). There is mild left  ventricular hypertrophy. Left ventricular diastolic function could not be  evaluated. There is severe hypokinesis of the left ventricular, entire  anterior wall and anteroseptal wall.   2. Right ventricular systolic function is moderately reduced. The right  ventricular size is normal. Mildly increased right ventricular wall  thickness.   3. The mitral valve is degenerative. Trivial mitral valve regurgitation.   4. The aortic valve is tricuspid. Aortic valve regurgitation not well  assessed. _____________ Cardiac Catheterization  7.27.2022                Left Anterior Descending    Vessel is large.    Prox LAD to Mid LAD lesion is 20% stenosed.    First Diagonal Branch             Vessel is small in size.             Second Diagonal Branch            Vessel is small in size.            Third Diagonal Branch             Vessel is small in size.             Left Circumflex     Vessel is moderate in size.     Mid Cx to Dist Cx lesion is 55% stenosed.     First Obtuse Marginal Branch           Vessel is small in size.           Second Obtuse Marginal Branch          Vessel is moderate in size.          Third Obtuse Marginal Branch  Vessel is moderate in size. There is moderate disease in the vessel.  Right Coronary Artery   Vessel is large.   Prox RCA to Mid RCA lesion is 60% stenosed. The lesion is focal.   Dist RCA lesion is 30% stenosed.   Right Posterior Descending Artery        Vessel is moderate in size.        Right Posterior Atrioventricular Artery      Vessel is moderate in size.      First Right Posterolateral Branch          Vessel is small in size.          Second Right Posterolateral Branch       Vessel is small in size.       Third Right  Posterolateral Branch         Vessel is small in size.         Pressures RA (mean): 14 mmHg RV (S/EDP): 60/12 mmHg PA (S/D, mean): 60/30 (40) mmHg PCWP (mean): 25 mmHg  Ao sat: 92% PA sat: 65%  Fick CO: 3.8 L/min Fick CI: 2.0 L/min/m^2  PVR: 6.6 Wood units   Conclusions: Mild to moderate, non-obstructive coronary artery disease, including 30% mid LAD disease, 50-60% mid/distal LCx stenosis, and 60% proximal/mid RCA lesion. Mildly to moderately reduced left ventricular systolic function with mid anterior hypo/akinesis; query atypical Takotsubo variant.  LVEF ~45%. Mildly elevated left heart filling pressures. Moderately elevated right heart filling and pulmonary artery pressures with significant pulmonary vascular resistance. Mildly reduced Fick cardiac output/index.   Recommendations: Continue IV diuresis. Advance goal-directed medical therapy for cardiomyopathy, as tolerated.  Patient may benefit from outpatient advanced heart failure consultation. Increase metoprolol for rate control of atrial fibrillation. Restart IV heparin in 2 hours after TR band removal.  Transition back to apixaban tomorrow AM if no evidence of bleeding/vascular injury. Secondary prevention of coronary artery disease. _____________   Patient Profile     80 y.o. female with history of normal coronary arteries by LHC in 09/2005, persistent A. Fib status post DCCV on 11/30/2020 with recurrent Afib s/p repeat DCCV on amiodarone 01/20/2021, postoperative atrial flutter in 01/2015, less than 50% left renal artery stenosis by imaging in 2021, 1 to 39% bilateral ICA carotid stenosis by imaging in 2017, DM2, HTN, HLD, and obesity who was admitted 7/25 w/ resp failure and pulmonary edema in the setting of new LV dysfxn (EF 45-50%), and recurrent Afib on 7/25.  Cath 7/27 w/ nonobs dzs and mildly elevated filling pressures.  Assessment & Plan    1.  Acute combined systolic diastolic congestive heart failure/nonischemic  cardiomyopathy/acute hypoxic respiratory failure/acute pulmonary edema: Patient presented July 25 with dyspnea and pulmonary edema in the setting of new LV dysfunction with an EF of 45 to 50%.  Diagnostic catheterization without any significant coronary disease and suspicion for Takotsubo variant.  Moderately elevated filling pressures.  She continues to respond well to intravenous diuresis and is minus 77 ml overnight (no I/O for past 12 hrs however).  Wt stable.  Renal fxn stable.  She feels that breathing and abdominal girth are steadily improving but not quite back to baseline.  Continue beta-blocker, ARB, and intravenous Lasix.  We will plan to consolidate the blocker therapy to Toprol-XL following cardioversion, depending upon sinus rhythm rate.  Blood pressure in the low 100s this morning -will hold off on transitioning to [facility] or adding spironolactone.  Consider SGLT2i.  2.  Persistent atrial fibrillation: Rates improved on higher dose of beta-blocker and amiodarone-currently 70s to 80s.  Plan for cardioversion in the a.m.  Continue Eliquis.  3.  Essential hypertension: Stable to soft this morning.  Less than 50% renal artery stenosis on ultrasound earlier this year.  4.  Hyperlipidemia: LDL 38 earlier this year.  Continue statin therapy.  5.  Nonobstructive CAD/demand ischemia: Cath without significant coronary disease.  Continue beta-blocker and statin therapy.  No aspirin in setting of Eliquis.  6.  Hypokalemia: Potassium low normal at 3.5 today.  Continue potassium supplementation.  7.  Type 2 diabetes mellitus: A1c 6.5.  Insulin management per primary team.  Consider SGLT2i.  Signed, Murray Hodgkins, NP  01/29/2021, 8:48 AM    For questions or updates, please contact   Please consult [email or web address] for contact info under Cardiology/STEMI.

## 2021-01-30 ENCOUNTER — Encounter
Admission: EM | Disposition: A | Discharge status: Home / Self Care | Payer: Self-pay | Source: Home / Self Care | Attending: Internal Medicine

## 2021-01-30 ENCOUNTER — Inpatient Hospital Stay: Payer: Medicare Other | Admitting: Anesthesiology

## 2021-01-30 ENCOUNTER — Encounter: Payer: Self-pay | Admitting: Internal Medicine

## 2021-01-30 DIAGNOSIS — I480 Paroxysmal atrial fibrillation: Secondary | ICD-10-CM | POA: Diagnosis not present

## 2021-01-30 DIAGNOSIS — E871 Hypo-osmolality and hyponatremia: Secondary | ICD-10-CM

## 2021-01-30 DIAGNOSIS — I5043 Acute on chronic combined systolic (congestive) and diastolic (congestive) heart failure: Secondary | ICD-10-CM | POA: Diagnosis not present

## 2021-01-30 HISTORY — PX: CARDIOVERSION: SHX1299

## 2021-01-30 LAB — CBC
HCT: 41.1 % (ref 36.0–46.0)
Hemoglobin: 14.2 g/dL (ref 12.0–15.0)
MCH: 32.3 pg (ref 26.0–34.0)
MCHC: 34.5 g/dL (ref 30.0–36.0)
MCV: 93.6 fL (ref 80.0–100.0)
Platelets: 275 10*3/uL (ref 150–400)
RBC: 4.39 MIL/uL (ref 3.87–5.11)
RDW: 12.6 % (ref 11.5–15.5)
WBC: 9.4 10*3/uL (ref 4.0–10.5)
nRBC: 0.2 % (ref 0.0–0.2)

## 2021-01-30 LAB — BASIC METABOLIC PANEL
Anion gap: 10 (ref 5–15)
BUN: 25 mg/dL — ABNORMAL HIGH (ref 8–23)
CO2: 28 mmol/L (ref 22–32)
Calcium: 8.1 mg/dL — ABNORMAL LOW (ref 8.9–10.3)
Chloride: 96 mmol/L — ABNORMAL LOW (ref 98–111)
Creatinine, Ser: 0.69 mg/dL (ref 0.44–1.00)
GFR, Estimated: >60 mL/min (ref >60)
Glucose, Bld: 173 mg/dL — ABNORMAL HIGH (ref 70–99)
Potassium: 4.3 mmol/L (ref 3.5–5.1)
Sodium: 134 mmol/L — ABNORMAL LOW (ref 135–145)

## 2021-01-30 LAB — MAGNESIUM: Magnesium: 2 mg/dL (ref 1.7–2.4)

## 2021-01-30 LAB — GLUCOSE, CAPILLARY
Glucose-Capillary: 161 mg/dL — ABNORMAL HIGH (ref 70–99)
Glucose-Capillary: 272 mg/dL — ABNORMAL HIGH (ref 70–99)
Glucose-Capillary: 182 mg/dL — ABNORMAL HIGH (ref 70–99)
Glucose-Capillary: 217 mg/dL — ABNORMAL HIGH (ref 70–99)

## 2021-01-30 SURGERY — CARDIOVERSION
Anesthesia: General

## 2021-01-30 MED ORDER — AMIODARONE HCL 200 MG PO TABS
200.0000 mg | ORAL_TABLET | Freq: Two times a day (BID) | ORAL | Status: DC
  Administered 2021-01-30 – 2021-01-31 (×2): 200 mg via ORAL
  Filled 2021-01-30 (×2): qty 1

## 2021-01-30 MED ORDER — PROPOFOL 10 MG/ML IV BOLUS
INTRAVENOUS | Status: AC
  Filled 2021-01-30: qty 20

## 2021-01-30 MED ORDER — METOPROLOL SUCCINATE ER 100 MG PO TB24
100.0000 mg | ORAL_TABLET | Freq: Every day | ORAL | Status: DC
  Filled 2021-01-30: qty 1

## 2021-01-30 MED ORDER — FUROSEMIDE 40 MG PO TABS
40.0000 mg | ORAL_TABLET | Freq: Two times a day (BID) | ORAL | Status: DC
Start: 2021-01-30 — End: 2021-01-31
  Administered 2021-01-30 – 2021-01-31 (×2): 40 mg via ORAL
  Filled 2021-01-30 (×2): qty 1

## 2021-01-30 MED ORDER — PROPOFOL 10 MG/ML IV BOLUS
INTRAVENOUS | Status: DC | PRN
  Administered 2021-01-30: 10 mg via INTRAVENOUS
  Administered 2021-01-30: 30 mg via INTRAVENOUS
  Administered 2021-01-30: 10 mg via INTRAVENOUS

## 2021-01-30 NOTE — Transfer of Care (Signed)
Immediate Anesthesia Transfer of Care Note  Patient: Bonnie Rowland  Procedure(s) Performed: CARDIOVERSION  Patient Location: Short Stay  Anesthesia Type:General  Level of Consciousness: drowsy  Airway & Oxygen Therapy: Patient Spontanous Breathing and Patient connected to nasal cannula oxygen  Post-op Assessment: Report given to RN and Post -op Vital signs reviewed and stable  Post vital signs: Reviewed and stable  Last Vitals:  Vitals Value Taken Time  BP 149/83 01/30/21 0815  Temp    Pulse 96 01/30/21 0817  Resp 20 01/30/21 0817  SpO2 98 % 01/30/21 0817  Vitals shown include unvalidated device data.  Last Pain:  Vitals:   01/30/21 0805  TempSrc:   PainSc: 0-No pain         Complications: No notable events documented.

## 2021-01-30 NOTE — Progress Notes (Signed)
Progress Note  Patient Name: Bonnie Rowland Date of Encounter: 01/30/2021  Primary Cardiologist: Ida Rogue, MD  Subjective   The patient underwent successful cardioversion today by Dr. Ubaldo Glassing (I was not available for the procedure due to an emergency).  She is maintaining in sinus rhythm and feels significantly better.  Inpatient Medications    Scheduled Meds:  acidophilus  1 capsule Oral QHS   amiodarone  200 mg Oral BID   apixaban  5 mg Oral BID   furosemide  40 mg Oral BID   insulin aspart  0-15 Units Subcutaneous TID WC   insulin aspart  0-5 Units Subcutaneous QHS   insulin glargine  18 Units Subcutaneous QHS   losartan  12.5 mg Oral Daily   metoprolol tartrate  50 mg Oral BID   multivitamin-lutein  1 capsule Oral BID   potassium chloride  40 mEq Oral BID   rosuvastatin  10 mg Oral QHS   sodium chloride flush  3 mL Intravenous Q12H   tiZANidine  2 mg Oral QHS   Continuous Infusions:  sodium chloride     PRN Meds: sodium chloride, acetaminophen **OR** acetaminophen, fluticasone, guaiFENesin, ondansetron **OR** ondansetron (ZOFRAN) IV, polyvinyl alcohol, sodium chloride flush   Vital Signs    Vitals:   01/30/21 0833 01/30/21 0845 01/30/21 0900 01/30/21 0956  BP: 120/66 (!) 142/74 130/70 132/64  Pulse: (!) 55 (!) 53 (!) 57 60  Resp: (!) 22 (!) '23 17 20  '$ Temp:    97.7 F (36.5 C)  TempSrc:    Oral  SpO2: 100% 100% 99% 96%  Weight:      Height:        Intake/Output Summary (Last 24 hours) at 01/30/2021 1150 Last data filed at 01/30/2021 0829 Gross per 24 hour  Intake 460 ml  Output 1400 ml  Net -940 ml    Filed Weights   01/29/21 0500 01/30/21 0500 01/30/21 0805  Weight: 83.6 kg 83.8 kg 83.8 kg    Physical Exam   GEN: Well nourished, well developed, in no acute distress.  HEENT: Grossly normal.  Neck: Supple, mildly elevated JVD.  No carotid bruits, or masses. Cardiac: Regular rate and rhythm, no murmurs, rubs, or gallops. No clubbing,  cyanosis, edema.  Lower extremities are tender to palpation.  Radials 2+, DP/PT 1+ and equal bilaterally.  Respiratory:  Respirations regular and unlabored, diminished breath sounds at bilateral bases. GI: Obese, relatively soft, nontender, nondistended, BS + x 4. MS: no deformity or atrophy. Skin: warm and dry, no rash. Neuro:  Strength and sensation are intact. Psych: AAOx3.  Normal affect.  Labs    Chemistry Recent Labs  Lab 01/28/21 0536 01/29/21 0659 01/30/21 0624  NA 134* 136 134*  K 3.4* 3.5 4.3  CL 94* 95* 96*  CO2 32 30 28  GLUCOSE 186* 167* 173*  BUN 19 18 25*  CREATININE 0.64 0.65 0.69  CALCIUM 7.8* 8.2* 8.1*  GFRNONAA >60 >60 >60  ANIONGAP '8 11 10      '$ Hematology Recent Labs  Lab 01/26/21 0112 01/27/21 0439 01/30/21 0624  WBC 11.2* 10.0 9.4  RBC 3.99 4.14 4.39  HGB 12.9 13.9 14.2  HCT 37.7 39.5 41.1  MCV 94.5 95.4 93.6  MCH 32.3 33.6 32.3  MCHC 34.2 35.2 34.5  RDW 13.0 12.9 12.6  PLT 189 202 275     Cardiac Enzymes  Recent Labs  Lab 01/22/21 2321 01/23/21 0120 01/23/21 1722 01/24/21 0032  TROPONINIHS 10 87* 692* 481*  BNP No results for input(s): BNP, PROBNP in the last 168 hours.   Lipids  Lab Results  Component Value Date   CHOL 130 07/12/2020   HDL 75 07/12/2020   LDLCALC 38 07/12/2020   TRIG 86 07/12/2020   CHOLHDL 1.7 07/12/2020    HbA1c  Lab Results  Component Value Date   HGBA1C 6.5 (H) 01/09/2021    Radiology    -----------  Telemetry    Atrial fibrillation, predominantly 70s to 80s- Personally Reviewed  Cardiac Studies   2D Echocardiogram 7.25.2022   1. Left ventricular ejection fraction, by estimation, is 45 to 50%. The  left ventricle has mildly decreased function. The left ventricle  demonstrates regional wall motion abnormalities (see scoring  diagram/findings for description). There is mild left  ventricular hypertrophy. Left ventricular diastolic function could not be  evaluated. There is  severe hypokinesis of the left ventricular, entire  anterior wall and anteroseptal wall.   2. Right ventricular systolic function is moderately reduced. The right  ventricular size is normal. Mildly increased right ventricular wall  thickness.   3. The mitral valve is degenerative. Trivial mitral valve regurgitation.   4. The aortic valve is tricuspid. Aortic valve regurgitation not well  assessed. _____________ Cardiac Catheterization  7.27.2022                Left Anterior Descending    Vessel is large.    Prox LAD to Mid LAD lesion is 20% stenosed.    First Diagonal Branch             Vessel is small in size.             Second Diagonal Branch            Vessel is small in size.            Third Diagonal Branch             Vessel is small in size.             Left Circumflex     Vessel is moderate in size.     Mid Cx to Dist Cx lesion is 55% stenosed.     First Obtuse Marginal Branch           Vessel is small in size.           Second Obtuse Marginal Branch          Vessel is moderate in size.          Third Obtuse Marginal Branch  Vessel is moderate in size. There is moderate disease in the vessel.  Right Coronary Artery   Vessel is large.   Prox RCA to Mid RCA lesion is 60% stenosed. The lesion is focal.   Dist RCA lesion is 30% stenosed.   Right Posterior Descending Artery        Vessel is moderate in size.        Right Posterior Atrioventricular Artery      Vessel is moderate in size.      First Right Posterolateral Branch          Vessel is small in size.          Second Right Posterolateral Branch       Vessel is small in size.       Third Right Posterolateral Branch         Vessel is small in size.  Pressures RA (mean): 14 mmHg RV (S/EDP): 60/12 mmHg PA (S/D, mean): 60/30 (40) mmHg PCWP (mean): 25 mmHg  Ao sat: 92% PA sat: 65%  Fick CO: 3.8 L/min Fick CI: 2.0 L/min/m^2  PVR: 6.6 Wood units   Conclusions: Mild to moderate, non-obstructive  coronary artery disease, including 30% mid LAD disease, 50-60% mid/distal LCx stenosis, and 60% proximal/mid RCA lesion. Mildly to moderately reduced left ventricular systolic function with mid anterior hypo/akinesis; query atypical Takotsubo variant.  LVEF ~45%. Mildly elevated left heart filling pressures. Moderately elevated right heart filling and pulmonary artery pressures with significant pulmonary vascular resistance. Mildly reduced Fick cardiac output/index.   Recommendations: Continue IV diuresis. Advance goal-directed medical therapy for cardiomyopathy, as tolerated.  Patient may benefit from outpatient advanced heart failure consultation. Increase metoprolol for rate control of atrial fibrillation. Restart IV heparin in 2 hours after TR band removal.  Transition back to apixaban tomorrow AM if no evidence of bleeding/vascular injury. Secondary prevention of coronary artery disease. _____________   Patient Profile     80 y.o. female with history of normal coronary arteries by LHC in 09/2005, persistent A. Fib status post DCCV on 11/30/2020 with recurrent Afib s/p repeat DCCV on amiodarone 01/20/2021, postoperative atrial flutter in 01/2015, less than 50% left renal artery stenosis by imaging in 2021, 1 to 39% bilateral ICA carotid stenosis by imaging in 2017, DM2, HTN, HLD, and obesity who was admitted 7/25 w/ resp failure and pulmonary edema in the setting of new LV dysfxn (EF 45-50%), and recurrent Afib on 7/25.  Cath 7/27 w/ nonobs dzs and mildly elevated filling pressures.  Assessment & Plan    1.  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  Diagnostic catheterization without any significant coronary disease and suspicion for Takotsubo variant.  Moderately elevated  filling pressures.   She felt significantly better today after cardioversion and there is no evidence of significant volume overload at the present time.  Thus, I elected to switch furosemide to 40 mg by mouth twice daily which likely can be changed to 40 mg once daily upon discharge. She is -9.5 L and her weight is down from 89 kg to 83.8 kilograms. I switch metoprolol tartrate to Toprol-XL.  Continue small dose losartan.  Can consider switching to [facility] as an outpatient if blood pressure allows.    Consider SGLT2i.  2.  Persistent atrial fibrillation: She is maintaining in sinus rhythm after cardioversion this morning and she is mildly bradycardic.  I decreased amiodarone to 200 mg twice daily.  Continue anticoagulation with Eliquis.    3.  Essential hypertension: Blood pressure is controlled.  4.  Hyperlipidemia: LDL 38 earlier this year.  Continue statin therapy.  5.  Nonobstructive CAD/demand ischemia: Cath showed mild to moderate nonobstructive coronary artery disease.    Continue beta-blocker and statin therapy.  No aspirin in setting of Eliquis.    Signed, Kathlyn Sacramento, MD  01/30/2021, 11:50 AM    For questions or updates, please contact   Please consult [email or web address] for contact info under Cardiology/STEMI.

## 2021-01-30 NOTE — Progress Notes (Signed)
   Heart Failure Nurse Navigator Note  Met with patient and her husband today.  Over portance of low-sodium diet.  Discussed keeping a diary and until she gets comfortable with eating a low-sodium diet.  Also went over fluid restriction.  They had no further questions.  Pricilla Riffle RN [facility]

## 2021-01-30 NOTE — Anesthesia Preprocedure Evaluation (Addendum)
Anesthesia Evaluation  Patient identified by MRN, date of birth, ID band Patient awake    Reviewed: Allergy & Precautions, NPO status , Patient's Chart, lab work & pertinent test results  History of Anesthesia Complications Negative for: history of anesthetic complications  Airway Mallampati: II       Dental  (+) Dental Advidsory Given   Pulmonary neg shortness of breath, neg sleep apnea, neg COPD, neg recent URI, Not current smoker,  Acute hypoxic respiratory failure on admission- Improved   Pulmonary exam normal        Cardiovascular hypertension, Pt. on medications (-) angina(-) Past MI and (-) CHF + dysrhythmias Atrial Fibrillation + Valvular Problems/Murmurs (murmur, no tx)  Rhythm:Irregular Rate:Normal  Hypertensive urgency with RAS on admission. Resolved  Elevated troponin/acute systolic dysfunction with acute on chronic HFpEF.   Treatment with BB and diuresis   Neuro/Psych neg Seizures    GI/Hepatic Neg liver ROS, GERD  Controlled,  Endo/Other  diabetes, Well Controlled, Type 2, Oral Hypoglycemic Agents  Renal/GU negative Renal ROS     Musculoskeletal  (+) Arthritis , Osteoarthritis,    Abdominal (+) + obese,   Peds  Hematology negative hematology ROS (+)   Anesthesia Other Findings 2D Echocardiogram7.25.2022  1. Left ventricular ejection fraction, by estimation, is 45 to 50%. The  left ventricle has mildly decreased function. The left ventricle  demonstrates regional wall motion abnormalities (see scoring  diagram/findings for description). There is mild left  ventricular hypertrophy. Left ventricular diastolic function could not be  evaluated. There is severe hypokinesis of the left ventricular, entire  anterior wall and anteroseptal wall.  2. Right ventricular systolic function is moderately reduced. The right  ventricular size is normal. Mildly increased right ventricular wall  thickness.   3. The mitral valve is degenerative. Trivial mitral valve regurgitation.  4. The aortic valve is tricuspid. Aortic valve regurgitation not well  assessed. _____________ Cardiac Catheterization7.27.2022              Left Anterior Descending   Vessel is large.   Prox LAD to Mid LAD lesion is 20% stenosed.   First Diagonal Branch            Vessel is small in size.            Second Diagonal Branch           Vessel is small in size.           Third Diagonal Branch            Vessel is small in size.            Left Circumflex    Vessel is moderate in size.    Mid Cx to Dist Cx lesion is 55% stenosed.    First Obtuse Marginal Branch          Vessel is small in size.          Second Obtuse Marginal Branch         Vessel is moderate in size.         Third Obtuse Marginal Branch Vessel is moderate in size. There is moderate disease in the vessel. Right Coronary Artery  Vessel is large.  Prox RCA to Mid RCA lesion is 60% stenosed. The lesion is focal.  Dist RCA lesion is 30% stenosed.  Right Posterior Descending Artery       Vessel is moderate in size.       Right Posterior Atrioventricular Artery  Vessel is moderate in size.     First Right Posterolateral Branch         Vessel is small in size.         Second Right Posterolateral Branch      Vessel is small in size.      Third Right Posterolateral Branch        Vessel is small in size.        Pressures RA (mean): 14 mmHg RV (S/EDP): 60/12 mmHg PA (S/D, mean): 60/30 (40) mmHg PCWP (mean): 25 mmHg  Ao sat: 92% PA sat: 65%  Fick CO: 3.8 L/min Fick CI: 2.0 L/min/m^2  PVR: 6.6 Wood units  Conclusions: 1. Mild to moderate, non-obstructive coronary artery disease, including 30% mid LAD disease, 50-60% mid/distal LCx stenosis, and  60% proximal/mid RCA lesion. 2. Mildly to moderately reduced left ventricular systolic function with mid anterior hypo/akinesis; query atypical Takotsubo variant. LVEF ~45%. 3. Mildly elevated left heart filling pressures. 4. Moderately elevated right heart filling and pulmonary artery pressures with significant pulmonary vascular resistance. 5. Mildly reduced Fick cardiac output/index.    Reproductive/Obstetrics                            Anesthesia Physical  Anesthesia Plan  ASA: 3  Anesthesia Plan: General   Post-op Pain Management:    Induction: Intravenous  PONV Risk Score and Plan: 3 and Propofol infusion and TIVA  Airway Management Planned: Nasal Cannula and Natural Airway  Additional Equipment:   Intra-op Plan:   Post-operative Plan:   Informed Consent: I have reviewed the patients History and Physical, chart, labs and discussed the procedure including the risks, benefits and alternatives for the proposed anesthesia with the patient or authorized representative who has indicated his/her understanding and acceptance.       Plan Discussed with: CRNA and Anesthesiologist  Anesthesia Plan Comments:        Anesthesia Quick Evaluation

## 2021-01-30 NOTE — Procedures (Signed)
Procedure:  Cardioversion.  Indication: sfib  After informed consent , time out protocol and adequate sedation per dept of anesthesia, , pt received a 120J synchronized DC Biphasic shock to nsr. No immediate complications.

## 2021-01-30 NOTE — Progress Notes (Signed)
PROGRESS NOTE    Bonnie Rowland  [id number] DOB: May 29, 1941 DOA: 01/23/2021 PCP: Steele Sizer, MD   Assessment & Plan:   Principal Problem:   Hypertensive emergency Active Problems:   AF (paroxysmal atrial fibrillation) (HCC)   Type 2 diabetes mellitus with hyperlipidemia (HCC)   Acute respiratory failure with hypoxia (HCC)   Hypertensive urgency   Acute on chronic combined systolic and diastolic CHF (congestive heart failure) (HCC)   Elevated troponin   Acute hypoxic respiratory failure: initially requiring BiPAP but since has been weaned off.  Pulse ox of 77% on 2 L in the ER.  Resolved   Hypertensive urgency: resolved but still w/ HTN. Continue on losartan, metoprolol   Acute on chronic combined CHF: echo shows EF [id number], diastolic function could not be evaluated. Monitor I/Os. Continue on losartan, metoprolol & lasix  Elevated troponin: likely secondary to demand ischemia. 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 Continue on eliquis   PAF: had recent cardioversion and in atrial fibrillation on presentation to the hospital. S/p cardioversion again 01/30/21 and back in sinus rhythm. Continue on amiodarone, metoprolol, & eliquis.   Hypokalemia: WNL today   Hyponatremia: labile   DM2: well controlled, HbA1c 6.5. Continue on glargine, SSI w/ accuchecks   HLD: continue on statin   Weakness: PT recs home health   Lactic acidosis: on presentation.      DVT prophylaxis: eliquis  Code Status: full  Family Communication:  Disposition Plan: likely d/c back home   Level of care: Progressive Cardiac  Status is: Inpatient  Remains inpatient appropriate because:Ongoing diagnostic testing needed not appropriate for outpatient work up, Unsafe d/c plan, IV treatments appropriate due to intensity of illness or inability to take PO, and Inpatient level of care  appropriate due to severity of illness  Dispo: The patient is from: Home              Anticipated d/c is to: Home              Patient currently is not medically stable to d/c.   Difficult to place patient: unclear    Consultants:  Cardio   Procedures:   Antimicrobials:   Subjective: Pt c/o malaise   Objective: Vitals:   01/29/21 1945 01/30/21 0146 01/30/21 0400 01/30/21 0500  BP: 129/61 108/75 108/73   Pulse: 96 63 64   Resp: '17 16 18   '$ Temp: 97.9 F (36.6 C) 98 F (36.7 C) 98.2 F (36.8 C)   TempSrc:      SpO2: 96% 97% 96%   Weight:    83.8 kg  Height:        Intake/Output Summary (Last 24 hours) at 01/30/2021 0754 Last data filed at 01/30/2021 0500 Gross per 24 hour  Intake 600 ml  Output 2300 ml  Net -1700 ml   Filed Weights   01/28/21 0355 01/29/21 0500 01/30/21 0500  Weight: 83.7 kg 83.6 kg 83.8 kg    Examination:  General exam: Appears comfortable  Respiratory system: decreased breath sounds b/l  Cardiovascular system: S1/S2+. No rubs or clicks  Gastrointestinal system: Abd is soft, NT, ND & hypoactive bowel sounds  Central nervous system: Alert and oriented. Moves all extremities  Psychiatry: Judgement and insight appear normal. Appropriate mood and affect    Data Reviewed: I have personally reviewed following labs and imaging studies  CBC: Recent Labs  Lab 01/24/21 0419  01/25/21 0450 01/26/21 0112 01/27/21 0439 01/30/21 0624  WBC 15.8* 11.0* 11.2* 10.0 9.4  HGB 12.5 12.9 12.9 13.9 14.2  HCT 36.3 37.8 37.7 39.5 41.1  MCV 95.3 94.5 94.5 95.4 93.6  PLT 185 188 189 202 [id number]   Basic Metabolic Panel: Recent Labs  Lab 01/26/21 0112 01/27/21 0439 01/28/21 0536 01/29/21 0659 01/30/21 0624  NA 134* 135 134* 136 134*  K 3.4* 3.3* 3.4* 3.5 4.3  CL 94* 92* 94* 95* 96*  CO2 36* 33* 32 30 28  GLUCOSE 199* 204* 186* 167* 173*  BUN '19 19 19 18 '$ 25*  CREATININE 0.81 0.71 0.64 0.65 0.69  CALCIUM 7.8* 8.0* 7.8* 8.2* 8.1*  MG  --   --  2.1 2.0  2.0   GFR: Estimated Creatinine Clearance: 59.5 mL/min (by C-G formula based on SCr of 0.69 mg/dL). Liver Function Tests: No results for input(s): AST, ALT, ALKPHOS, BILITOT, PROT, ALBUMIN in the last 168 hours.  No results for input(s): LIPASE, AMYLASE in the last 168 hours. No results for input(s): AMMONIA in the last 168 hours. Coagulation Profile: No results for input(s): INR, PROTIME in the last 168 hours. Cardiac Enzymes: No results for input(s): CKTOTAL, CKMB, CKMBINDEX, TROPONINI in the last 168 hours. BNP (last 3 results) No results for input(s): PROBNP in the last 8760 hours. HbA1C: No results for input(s): HGBA1C in the last 72 hours. CBG: Recent Labs  Lab 01/29/21 0803 01/29/21 1138 01/29/21 1618 01/29/21 2011 01/30/21 0624  GLUCAP 188* 224* 207* 216* 161*   Lipid Profile: No results for input(s): CHOL, HDL, LDLCALC, TRIG, CHOLHDL, LDLDIRECT in the last 72 hours. Thyroid Function Tests: No results for input(s): TSH, T4TOTAL, FREET4, T3FREE, THYROIDAB in the last 72 hours. Anemia Panel: No results for input(s): VITAMINB12, FOLATE, FERRITIN, TIBC, IRON, RETICCTPCT in the last 72 hours. Sepsis Labs: Recent Labs  Lab 01/24/21 0419  PROCALCITON 0.21    Recent Results (from the past 240 hour(s))  SARS CORONAVIRUS 2 (TAT 6-24 HRS) Nasopharyngeal Nasopharyngeal Swab     Status: None   Collection Time: 01/23/21 12:43 AM   Specimen: Nasopharyngeal Swab  Result Value Ref Range Status   SARS Coronavirus 2 NEGATIVE NEGATIVE Final    Comment: (NOTE) SARS-CoV-2 target nucleic acids are NOT DETECTED.  The SARS-CoV-2 RNA is generally detectable in upper and lower respiratory specimens during the acute phase of infection. Negative results do not preclude SARS-CoV-2 infection, do not rule out co-infections with other pathogens, and should not be used as the sole basis for treatment or other patient management decisions. Negative results must be combined with clinical  observations, patient history, and epidemiological information. The expected result is Negative.  Fact Sheet for Patients: SugarRoll.be  Fact Sheet for Healthcare Providers: https://www.woods-mathews.com/  This test is not yet approved or cleared by the [place] FDA and  has been authorized for detection and/or diagnosis of SARS-CoV-2 by FDA under an Emergency Use Authorization (EUA). This EUA will remain  in effect (meaning this test can be used) for the duration of the COVID-19 declaration under Se ction 564(b)(1) of the Act, 21 U.S.C. section 360bbb-3(b)(1), unless the authorization is terminated or revoked sooner.  Performed at [facility], [place] [address] [place], [place] [address]   Culture, blood (routine x 2)     Status: None (Preliminary result)   Collection Time: 01/23/21  4:02 AM   Specimen: BLOOD  Result Value Ref Range Status   Specimen Description BLOOD LEFT AC  Final  Special Requests   Final    BOTTLES DRAWN AEROBIC AND ANAEROBIC Blood Culture adequate volume   Culture   Final    NO GROWTH 4 DAYS Performed at [facility], [address] [place], [place] [address]    Report Status PENDING  Incomplete  Culture, blood (routine x 2)     Status: None (Preliminary result)   Collection Time: 01/23/21  4:13 AM   Specimen: BLOOD  Result Value Ref Range Status   Specimen Description BLOOD LEFT HAND  Final   Special Requests   Final    BOTTLES DRAWN AEROBIC AND ANAEROBIC Blood Culture adequate volume   Culture   Final    NO GROWTH 4 DAYS Performed at [facility], [address], [place], [place] [address]    Report Status PENDING  Incomplete         Radiology Studies: No results found.      Scheduled Meds:  acidophilus  1 capsule Oral QHS   amiodarone  400 mg Oral BID   apixaban  5 mg Oral BID   furosemide  40 mg Intravenous Q8H   insulin aspart  0-15 Units Subcutaneous TID  WC   insulin aspart  0-5 Units Subcutaneous QHS   insulin glargine  18 Units Subcutaneous QHS   losartan  12.5 mg Oral Daily   metoprolol tartrate  50 mg Oral BID   multivitamin-lutein  1 capsule Oral BID   potassium chloride  40 mEq Oral BID   rosuvastatin  10 mg Oral QHS   sodium chloride flush  3 mL Intravenous Q12H   tiZANidine  2 mg Oral QHS   Continuous Infusions:  sodium chloride       LOS: 7 days    Time spent: 15 mins    Wyvonnia Dusky, MD [facility] Pager [phone]  If 7PM-7AM, please contact night-coverage 01/30/2021, 7:54 AM

## 2021-01-30 NOTE — Progress Notes (Signed)
Physical Therapy Treatment Patient Details Name: Bonnie Rowland MRN: [id number] DOB: 12-Oct-1940 Today's Date: 01/30/2021    History of Present Illness Pt admitted for HTN emergency with complaints of SOB symptoms. Pt is s/p radial heart cath on 01/25/21. History includes HTN, DM, Afib with cardioversion 7/22.    PT Comments    Pt was pleasant and motivated to participate during the session. She gave good effort throughout session. Pt performed all functional mobility with modified independence-min guard. Pt able to progress to ambulating with 3 point cane,but demonstrated very slow speed and decreased step length. Pt used RW for second half of walk after standing rest breaking demonstrating increased speed and step length compared to walking with 3 point cane. Pt is limited with functional mobility secondary to generalized weakness and fatigue. Pt will benefit from HHPT upon discharge to safely address deficits listed in patient problem list for decreased caregiver assistance and eventual return to PLOF.     Follow Up Recommendations  Home health PT;Supervision - Intermittent     Equipment Recommendations  None recommended by PT    Recommendations for Other Services       Precautions / Restrictions Precautions Precautions: Fall Restrictions Weight Bearing Restrictions: No    Mobility  Bed Mobility Overal bed mobility: Modified Independent             General bed mobility comments: Used rails and increased time to complete.    Transfers Overall transfer level: Needs assistance Equipment used:  (3 point cane) Transfers: Sit to/from Stand Sit to Stand: Min guard         General transfer comment: Min guard for increased safety  Ambulation/Gait Ambulation/Gait assistance: Min guard Gait Distance (Feet): 75 Feet x2 Assistive device: Rolling walker (2 wheeled) (3 point cane) Gait Pattern/deviations: Step-through pattern Gait velocity: decreased   General Gait  Details: Patient ambulates with very slow pace but overall steady with no LOB. Pt demonstrated increased speed with RW compared to 3 point cane.   Stairs             Wheelchair Mobility    Modified Rankin (Stroke Patients Only)       Balance Overall balance assessment: Needs assistance Sitting-balance support: Feet supported Sitting balance-Leahy Scale: Good Sitting balance - Comments: Supervision at EOB   Standing balance support: Bilateral upper extremity supported;During functional activity Standing balance-Leahy Scale: Fair Standing balance comment: Reliance on single UE support with cane but able to maintain balance 15-20 sec without support                            Cognition Arousal/Alertness: Awake/alert Behavior During Therapy: WFL for tasks assessed/performed Overall Cognitive Status: Within Functional Limits for tasks assessed                                        Exercises Total Joint Exercises Marching in Standing: AROM;Both;10 reps;Standing Other Exercises Other Exercises: Static standing 1-2 min with min guard for improved activity tolerance. Other Exercises: Static sitting 2-3 min with supervision for improved trunk control and balance. Other Exercises: Mini squats x10 with min guard and verbal cues for sequencing to improve LE concentric/eccentric control.    General Comments        Pertinent Vitals/Pain Pain Assessment: No/denies pain    Home Living  Prior Function            PT Goals (current goals can now be found in the care plan section) Progress towards PT goals: Progressing toward goals    Frequency    Min 2X/week      PT Plan Current plan remains appropriate    Co-evaluation              AM-PAC PT "6 Clicks" Mobility   Outcome Measure  Help needed turning from your back to your side while in a flat bed without using bedrails?: A Little Help needed moving  from lying on your back to sitting on the side of a flat bed without using bedrails?: A Little Help needed moving to and from a bed to a chair (including a wheelchair)?: A Little Help needed standing up from a chair using your arms (e.g., wheelchair or bedside chair)?: A Little Help needed to walk in hospital room?: A Little Help needed climbing 3-5 steps with a railing? : A Little 6 Click Score: 18    End of Session Equipment Utilized During Treatment: Gait belt Activity Tolerance: Patient tolerated treatment well Patient left: in bed;with call bell/phone within reach;with bed alarm set (heels floating via pillow) Nurse Communication: Mobility status (New purewick needed) PT Visit Diagnosis: Muscle weakness (generalized) (M62.81);Difficulty in walking, not elsewhere classified (R26.2);Unsteadiness on feet (R26.81)     Time: [id number] PT Time Calculation (min) (ACUTE ONLY): 30 min  Charges:                        Dayton Scrape SPT 01/30/21, 3:33 PM

## 2021-01-31 ENCOUNTER — Encounter: Payer: Self-pay | Admitting: Cardiovascular Disease

## 2021-01-31 DIAGNOSIS — E1169 Type 2 diabetes mellitus with other specified complication: Secondary | ICD-10-CM | POA: Diagnosis not present

## 2021-01-31 DIAGNOSIS — I4819 Other persistent atrial fibrillation: Secondary | ICD-10-CM | POA: Diagnosis not present

## 2021-01-31 DIAGNOSIS — I161 Hypertensive emergency: Secondary | ICD-10-CM | POA: Diagnosis not present

## 2021-01-31 DIAGNOSIS — I480 Paroxysmal atrial fibrillation: Secondary | ICD-10-CM | POA: Diagnosis not present

## 2021-01-31 DIAGNOSIS — E785 Hyperlipidemia, unspecified: Secondary | ICD-10-CM | POA: Diagnosis not present

## 2021-01-31 DIAGNOSIS — I5043 Acute on chronic combined systolic (congestive) and diastolic (congestive) heart failure: Secondary | ICD-10-CM | POA: Diagnosis not present

## 2021-01-31 LAB — CULTURE, BLOOD (ROUTINE X 2)
Culture: NO GROWTH
Culture: NO GROWTH
Special Requests: ADEQUATE
Special Requests: ADEQUATE

## 2021-01-31 LAB — CBC
HCT: 41.1 % (ref 36.0–46.0)
Hemoglobin: 13.9 g/dL (ref 12.0–15.0)
MCH: 32 pg (ref 26.0–34.0)
MCHC: 33.8 g/dL (ref 30.0–36.0)
MCV: 94.7 fL (ref 80.0–100.0)
Platelets: 285 10*3/uL (ref 150–400)
RBC: 4.34 MIL/uL (ref 3.87–5.11)
RDW: 12.6 % (ref 11.5–15.5)
WBC: 8.4 10*3/uL (ref 4.0–10.5)
nRBC: 0 % (ref 0.0–0.2)

## 2021-01-31 LAB — MAGNESIUM: Magnesium: 2 mg/dL (ref 1.7–2.4)

## 2021-01-31 LAB — GLUCOSE, CAPILLARY
Glucose-Capillary: 183 mg/dL — ABNORMAL HIGH (ref 70–99)
Glucose-Capillary: 242 mg/dL — ABNORMAL HIGH (ref 70–99)

## 2021-01-31 LAB — BASIC METABOLIC PANEL
Anion gap: 9 (ref 5–15)
BUN: 26 mg/dL — ABNORMAL HIGH (ref 8–23)
CO2: 26 mmol/L (ref 22–32)
Calcium: 8 mg/dL — ABNORMAL LOW (ref 8.9–10.3)
Chloride: 98 mmol/L (ref 98–111)
Creatinine, Ser: 0.9 mg/dL (ref 0.44–1.00)
GFR, Estimated: >60 mL/min (ref >60)
Glucose, Bld: 142 mg/dL — ABNORMAL HIGH (ref 70–99)
Potassium: 5 mmol/L (ref 3.5–5.1)
Sodium: 133 mmol/L — ABNORMAL LOW (ref 135–145)

## 2021-01-31 MED ORDER — AMIODARONE HCL 200 MG PO TABS: 200.0000 mg | ORAL_TABLET | Freq: Two times a day (BID) | ORAL | 3 refills | Status: DC

## 2021-01-31 MED ORDER — FUROSEMIDE 40 MG PO TABS: 40.0000 mg | ORAL_TABLET | Freq: Every day | ORAL | 0 refills | Status: DC

## 2021-01-31 MED ORDER — LOSARTAN POTASSIUM 25 MG PO TABS: 12.5000 mg | ORAL_TABLET | Freq: Every day | ORAL | 0 refills | Status: DC

## 2021-01-31 MED ORDER — FUROSEMIDE 40 MG PO TABS: 40.0000 mg | ORAL_TABLET | Freq: Every day | ORAL | Status: DC

## 2021-01-31 NOTE — Plan of Care (Signed)
Pt slept in recliner overnight per her preference. HR 50s, SB overnight. Pt denies any dizziness/palpitations, pain or SOB.    Problem: Clinical Measurements: Goal: Ability to maintain clinical measurements within normal limits will improve Outcome: Progressing Goal: Cardiovascular complication will be avoided Outcome: Progressing   Problem: Pain Managment: Goal: General experience of comfort will improve Outcome: Progressing

## 2021-01-31 NOTE — Care Management Important Message (Signed)
Important Message  Patient Details  Name: Bonnie Rowland MRN: [id number] Date of Birth: 1941/01/21   Medicare Important Message Given:  Yes     Dannette Barbara 01/31/2021, 11:37 AM

## 2021-01-31 NOTE — Anesthesia Postprocedure Evaluation (Signed)
Anesthesia Post Note  Patient: Bonnie Rowland  Procedure(s) Performed: CARDIOVERSION  Patient location during evaluation: PACU Anesthesia Type: General Level of consciousness: awake and alert Pain management: pain level controlled Vital Signs Assessment: post-procedure vital signs reviewed and stable Respiratory status: spontaneous breathing, nonlabored ventilation and respiratory function stable Cardiovascular status: blood pressure returned to baseline and stable Postop Assessment: no apparent nausea or vomiting Anesthetic complications: no   No notable events documented.   Last Vitals:  Vitals:   01/31/21 0808 01/31/21 1239  BP: (!) 133/58 118/84  Pulse: (!) 52 (!) 54  Resp: 20 18  Temp: 36.8 C (!) 36.4 C  SpO2: 97% 97%    Last Pain:  Vitals:   01/31/21 0745  TempSrc:   PainSc: 0-No pain                 Iran Ouch

## 2021-01-31 NOTE — TOC Progression Note (Addendum)
Transition of Care [facility]) - Progression Note    Patient Details  Name: Bonnie Rowland MRN: [id number] Date of Birth: 1940/09/09  Transition of Care [facility]) CM/SW [place], [place] Phone Number: 01/31/2021, 11:41 AM  Clinical Narrative:     Patient confirms living at [place], in agreement with CSW to set up home health services including RN and PT with no agency preference. No DME needed, reports having a walker and shower chair at home.   CSW has sent referral to [facility] with [facility], they have accepted for [facility] services.   Expected Discharge Plan: [place] Barriers to Discharge: Continued Medical Work up  Expected Discharge Plan and Services Expected Discharge Plan: [place] arrangements for the past 2 months: Single Family Home                                       Social Determinants of Health (SDOH) Interventions    Readmission Risk Interventions No flowsheet data found.

## 2021-01-31 NOTE — Progress Notes (Signed)
Progress Note  Patient Name: Bonnie Rowland Date of Encounter: 01/31/2021  Primary Cardiologist: Ida Rogue, MD   Subjective   No chest pain or shortness of breath. No tachypalpitations.  No dizziness.  Reviewed recommendations for follow-up/medications.  Feels significantly better in NSR. Requests some information regarding monitoring her vitals and ensuring medication compliance at home.  We will add this information to her AVS.  Inpatient Medications    Scheduled Meds:  acidophilus  1 capsule Oral QHS   amiodarone  200 mg Oral BID   apixaban  5 mg Oral BID   furosemide  40 mg Oral BID   insulin aspart  0-15 Units Subcutaneous TID WC   insulin aspart  0-5 Units Subcutaneous QHS   insulin glargine  18 Units Subcutaneous QHS   losartan  12.5 mg Oral Daily   metoprolol succinate  100 mg Oral Daily   multivitamin-lutein  1 capsule Oral BID   potassium chloride  40 mEq Oral BID   rosuvastatin  10 mg Oral QHS   sodium chloride flush  3 mL Intravenous Q12H   tiZANidine  2 mg Oral QHS   Continuous Infusions:  sodium chloride     PRN Meds: sodium chloride, acetaminophen **OR** acetaminophen, fluticasone, guaiFENesin, ondansetron **OR** ondansetron (ZOFRAN) IV, polyvinyl alcohol, sodium chloride flush   Vital Signs    Vitals:   01/31/21 0424 01/31/21 0600 01/31/21 0808 01/31/21 1239  BP: 117/65  (!) 133/58 118/84  Pulse: (!) 53  (!) 52 (!) 54  Resp: '16  20 18  '$ Temp: 97.6 F (36.4 C)  98.2 F (36.8 C) (!) 97.5 F (36.4 C)  TempSrc: Oral     SpO2: 97%  97% 97%  Weight:  83 kg    Height:        Intake/Output Summary (Last 24 hours) at 01/31/2021 1351 Last data filed at 01/31/2021 0600 Gross per 24 hour  Intake 480 ml  Output 250 ml  Net 230 ml   Last 3 Weights 01/31/2021 01/30/2021 01/30/2021  Weight (lbs) 182 lb 14.4 oz 184 lb 11.9 oz 184 lb 11.9 oz  Weight (kg) 82.963 kg 83.8 kg 83.8 kg      Telemetry    SB-NSR, 48 to 60s with a 1 -2 minute period of  artifact at approximately 10 PM last night, at which time she reports she was brushing her teeth- Personally Reviewed  ECG    No new tracings- Personally Reviewed  Physical Exam   GEN: No acute distress.  Joined by her husband. Neck: No JVD Cardiac: RRR, no murmurs, rubs, or gallops.  Respiratory: Clear to auscultation bilaterally-very slightly diminished breath sounds at bilateral bases. GI: Soft, nontender, non-distended  MS: No edema; No deformity. Neuro:  Nonfocal  Psych: Normal affect   Labs    High Sensitivity Troponin:   Recent Labs  Lab 01/22/21 2321 01/23/21 0120 01/23/21 1722 01/24/21 0032  TROPONINIHS 10 87* 692* 481*      Chemistry Recent Labs  Lab 01/29/21 0659 01/30/21 0624 01/31/21 0703  NA 136 134* 133*  K 3.5 4.3 5.0  CL 95* 96* 98  CO2 '30 28 26  '$ GLUCOSE 167* 173* 142*  BUN 18 25* 26*  CREATININE 0.65 0.69 0.90  CALCIUM 8.2* 8.1* 8.0*  GFRNONAA >60 >60 >60  ANIONGAP '11 10 9     '$ Hematology Recent Labs  Lab 01/27/21 0439 01/30/21 0624 01/31/21 0703  WBC 10.0 9.4 8.4  RBC 4.14 4.39 4.34  HGB 13.9 14.2  13.9  HCT 39.5 41.1 41.1  MCV 95.4 93.6 94.7  MCH 33.6 32.3 32.0  MCHC 35.2 34.5 33.8  RDW 12.9 12.6 12.6  PLT 202 275 285    BNPNo results for input(s): BNP, PROBNP in the last 168 hours.   DDimer No results for input(s): DDIMER in the last 168 hours.   Radiology    No results found.  Cardiac Studies   2D Echocardiogram 7.25.2022   1. Left ventricular ejection fraction, by estimation, is 45 to 50%. The  left ventricle has mildly decreased function. The left ventricle  demonstrates regional wall motion abnormalities (see scoring  diagram/findings for description). There is mild left  ventricular hypertrophy. Left ventricular diastolic function could not be  evaluated. There is severe hypokinesis of the left ventricular, entire  anterior wall and anteroseptal wall.   2. Right ventricular systolic function is moderately  reduced. The right  ventricular size is normal. Mildly increased right ventricular wall  thickness.   3. The mitral valve is degenerative. Trivial mitral valve regurgitation.   4. The aortic valve is tricuspid. Aortic valve regurgitation not well  assessed. _____________ Cardiac Catheterization  7.27.2022                            Left Anterior Descending      Vessel is large.      Prox LAD to Mid LAD lesion is 20% stenosed.      First Diagonal Branch                        Vessel is small in size.                        Second Diagonal Branch                      Vessel is small in size.                      Third Diagonal Branch                        Vessel is small in size.                        Left Circumflex        Vessel is moderate in size.        Mid Cx to Dist Cx lesion is 55% stenosed.        First Obtuse Marginal Branch                    Vessel is small in size.                    Second Obtuse Marginal Branch                  Vessel is moderate in size.                  Third Obtuse Marginal Branch  Vessel is moderate in size. There is moderate disease in the vessel.  Right Coronary Artery    Vessel is large.    Prox RCA to Mid RCA lesion is 60% stenosed. The lesion is focal.    Dist RCA lesion is 30% stenosed.    Right Posterior Descending Artery  Vessel is moderate in size.              Right Posterior Atrioventricular Artery          Vessel is moderate in size.          First Right Posterolateral Branch                  Vessel is small in size.                  Second Right Posterolateral Branch            Vessel is small in size.            Third Right Posterolateral Branch                Vessel is small in size.                Pressures RA (mean): 14 mmHg RV (S/EDP): 60/12 mmHg PA (S/D, mean): 60/30 (40) mmHg PCWP (mean): 25 mmHg  Ao sat: 92% PA sat: 65%  Fick CO: 3.8 L/min Fick CI: 2.0 L/min/m^2  PVR: 6.6 Wood units    Conclusions: Mild to moderate, non-obstructive coronary artery disease, including 30% mid LAD disease, 50-60% mid/distal LCx stenosis, and 60% proximal/mid RCA lesion. Mildly to moderately reduced left ventricular systolic function with mid anterior hypo/akinesis; query atypical Takotsubo variant.  LVEF ~45%. Mildly elevated left heart filling pressures. Moderately elevated right heart filling and pulmonary artery pressures with significant pulmonary vascular resistance. Mildly reduced Fick cardiac output/index.   Recommendations: Continue IV diuresis. Advance goal-directed medical therapy for cardiomyopathy, as tolerated.  Patient may benefit from outpatient advanced heart failure consultation. Increase metoprolol for rate control of atrial fibrillation. Restart IV heparin in 2 hours after TR band removal.  Transition back to apixaban tomorrow AM if no evidence of bleeding/vascular injury. Secondary prevention of coronary artery disease. _____________    Patient Profile     80 y.o. female  with history of normal coronary arteries by LHC in 09/2005, persistent A. Fib status post DCCV on 11/30/2020 with recurrent Afib s/p repeat DCCV on amiodarone 01/20/2021, postoperative atrial flutter in 01/2015, less than 50% left renal artery stenosis by imaging in 2021, 1 to 39% bilateral ICA carotid stenosis by imaging in 2017, DM2, HTN, HLD, and obesity who was admitted 7/25 w/ resp failure and pulmonary edema in the setting of new LV dysfxn (EF 45-50%), and recurrent Afib on 7/25.  Cath 7/27 w/ nonobs dzs and mildly elevated filling pressures.  S/p DCCV 01/30/2021.  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  LHC was without significant CAD and suspicion for Takotsubo variant.  She  reports improvement of symptoms with DCCV and diuresis this admission.  Relatively euvolemic on exam. Net negative 9.128L for admission. Wt down from 83.8kg  83kg.  --Decreased frequency of furosemide 40 mg BID  furosemide 40 MG QD.  -- Recommend follow-up BMET at RTC. --Toprol held this AM given SB in NSR.  She has been on Toprol 100 mg daily.  Consider restart at reduced dose Toprol 12.5 mg daily as an outpatient if rates/BP allows. --Continue losartan. Consider SGLT2 inhibitor at RTC / further escalation of GDMT --Pending MD to see patient, likely stable from a cardiac standpoint for discharge home with follow-up  scheduled in the clinic.  She reports she has follow-up scheduled with the heart failure clinic already.  Persistent atrial fibrillation --Maintaining NSR after DCCV 01/30/2021.  Reports improvement of symptoms with DCCV.  Given her SB in NSR, BB held this AM.  Consider restart of Toprol at reduced dose 12.5 mg as an outpatient if rates / BP allows.  Continue amiodarone 200 mg twice daily  amiodarone monitoring labs / EKG (Qtc) at RTC with possible decrease to amiodarone '200mg'$  once daily at that time if indicated.  Continue anticoagulation with Eliquis 5 mg twice daily.  Essential hypertension --BP well controlled.  Continue current medications.  HLD --LDL well controlled at 38.  Continue statin.  Nonobstructive CAD/demand ischemia --LHC with mild to moderate nonobstructive CAD.  Continue beta-blocker and statin.  No ASA in the setting of Eliquis.  Risk factor modification recommended.   For questions or updates, please contact [place] Please consult [email or web address] for contact info under        Signed, Arvil Chaco, [name]  01/31/2021, 1:51 PM

## 2021-01-31 NOTE — Progress Notes (Signed)
Discharge instructions explained to pt and pts husband/ verbalized an understanding/ iv and tele removed/ will transport off unit via wheelchair.

## 2021-01-31 NOTE — Discharge Instructions (Signed)
Blood pressure: --We recommend upper arm BP cuffs over that of the wrist. --You can always check your device's accuracy against an office model once a year if possible. You can do so by bringing your cuff into the office and notifying the person that rooms you that you would like to check your cuff against our office readings. --Measure your BP at the same time each day. Blood pressure varies often throughout the day with higher readings in the morning. BP may also be lower at home than in the office. Do not measure BP right after you wake up or after exercising. Avoid caffeine, tobacco, and alcohol for 30 minutes before taking a measurement.  --Sit quietly for five minutes in a comfortable position with your legs and ankles uncrossed and back supported. Feet flat on the ground. Have your arm supported and at the level of your heart. Always use the same arm when taking your blood pressure. Place the cuff over bare skin rather than clothing. Each time you measure, take an additional reading if abnormal to ensure accurate by waiting 1-3 minutes after the first reading. --Please bring a BP log into the office. It is helpful to document the time of each BP reading, as well as any activity or medications taken around the reading. In addition, it is helpful to include heart rate at the time of the BP reading. Daily weights are also encouraged. --Goal BP is 130/80 or lower. If your BP is low but you are not dizzy, this is fine and preferred over BP higher than 130/80. If your BP is less than 100 for the top number and you are dizzy, call the office. If your blood pressure is consistently elevated with top number above 180 and bottom above 120, this can damage the body. If you have severe increase in your blood pressure or concerning symptoms of severe chest pain, headache with confusion and blurred vision, severe abdominal or back pain, shortness of breath, seizures, or loss of consciousness, go to the emergency  department.    Patients on amiodarone may need intermittent check-ups of other organ systems, such as lungs, thyroid, eyes, and liver function. Please talk to your doctor at your follow-up appointments about what monitoring may be needed for you.   Patients taking blood thinners should generally stay away from medicines like ibuprofen, Advil, Motrin, naproxen, and Aleve due to risk of stomach bleeding. You may take Tylenol as directed or talk to your primary doctor about alternatives.   Please follow these special instructions for your heart:  1. Follow a low-salt diet - you are allowed no more than 2,'000mg'$  of sodium per day. Watch your fluid intake. In general, you should not be taking in more than 2 liters of fluid per day (no more than 8 glasses per day). This includes sources of water in foods like soup, coffee, tea, milk, etc. 2. Weigh yourself on the same scale at same time of day and keep a log. 3. Call your doctor: (Anytime you feel any of the following symptoms)  - 3lb weight gain overnight or 5lb within a few days - Shortness of breath, with or without a dry hacking cough  - Swelling in the hands, feet or stomach  - If you have to sleep on extra pillows at night in order to breathe   We recommend a maximum of 2g sodium per day and 2L total fluid per day. Fluids include coffee, tea, water, and juice.  In addition, we recommend you  monitor both your daily weight and daily BP at the same time each day - bring this long into the office.    IT IS IMPORTANT TO LET YOUR DOCTOR KNOW EARLY ON IF YOU ARE HAVING SYMPTOMS SO WE CAN HELP YOU!    **PLEASE REMEMBER TO BRING ALL OF YOUR MEDICATIONS TO EACH OF YOUR FOLLOW-UP OFFICE VISITS.

## 2021-01-31 NOTE — Discharge Summary (Signed)
Physician Discharge Summary  Bonnie Rowland [id number] DOB: [birth year] DOA: 01/23/2021  PCP: Steele Sizer, MD  Admit date: 01/23/2021 Discharge date: 01/31/2021  Admitted From: home  Disposition:  home w/ home health   Recommendations for Outpatient Follow-up:  Follow up with PCP in 1-2 weeks F/u w/ cardio, Dr. Rockey Situ, in 2 weeks BMP in 1 week   [place]: yes Equipment/Devices:  Discharge Condition: stable  CODE STATUS:full  Diet recommendation: Heart Healthy / Carb Modified   Brief/Interim Summary: HPI was taken from Dr. Damita Dunnings: Bonnie Rowland is a 80 y.o. female with medical history significant for DM, HTN, persistent A. fib status post cardioversion on 7/22 who presented to the emergency room at the recommendation of cardiology fellow due to difficulty breathing.  Patient initially called the cardiology service on 7/23 with a complaint of lightheadedness.  At the time she reported bradycardia and occasional hypoxia but with good blood pressure.  She was instructed to continue her amiodarone and stop the Toprol.  She was otherwise without chest pain or shortness of breath.  On the night of arrival however she started experiencing difficulty breathing and was thus advised to present to the emergency room.  She denied chest pain, cough, fever or chills.  Had no nausea, vomiting or diaphoresis, no abdominal pain or diarrhea   ED course: On arrival, afebrile BP 204/116 with pulse of 74 respirations 36 with O2 sats 77 on 2 L subsequently being placed on BiPAP ABG on BiPAP with pH 7.36, PCO2 of 49 and PO2 59 WBC 11,000 with hemoglobin 13.7, troponin 10, BNP 237 ABG subsequently improved on BiPAP to pH 7.39, PCO2 of 40 and PO2 119   EKG, personally viewed and interpreted sinus rhythm at 71 with nonspecific ST-T wave changes   Imaging: Mild bibasilar atelectasis and/or infiltrate with a mild component of superimposed interstitial edema   Patient was treated with IV Lasix and  IV morphine as well as BiPAP with symptomatic improvement.  Hospitalist consulted for admission.  Hospital course from Dr. Jimmye Norman 7/27-01/31/21: Pt presented w/ shortness of breath likely secondary to CHF and PAF w/ RVR. Pt was treated w/ IV lasix, losartan & BB. Pt diuresed fairly well. Of note, pt had a cardioversion and it was successful and pt was in sinus rhythm. Pt was d/c home w/ po amiodarone, lasix, losartan, & eliquis. Cardio recommended that the BB be held and pt will f/u w/ Dr. Rockey Situ in 2 weeks. Please see previous progress/consult notes for more information.    Discharge Diagnoses:  Principal Problem:   Hypertensive emergency Active Problems:   AF (paroxysmal atrial fibrillation) (HCC)   Type 2 diabetes mellitus with hyperlipidemia (HCC)   Acute respiratory failure with hypoxia (HCC)   Hypertensive urgency   Acute on chronic combined systolic and diastolic CHF (congestive heart failure) (HCC)   Elevated troponin  Acute hypoxic respiratory failure: initially requiring BiPAP but since has been weaned off.  Pulse ox of 77% on 2 L in the ER.  Resolved   Hypertensive urgency: resolved but still w/ HTN. Continue on losartan, metoprolol    Acute on chronic combined CHF: echo shows EF [id number], diastolic function could not be evaluated. Monitor I/Os. Continue on losartan, metoprolol & lasix   Elevated troponin: likely secondary to demand ischemia. 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 Continue on eliquis   PAF: had recent cardioversion and in atrial  fibrillation on presentation to the hospital. S/p cardioversion again 01/30/21 and back in sinus rhythm. Continue on amiodarone, metoprolol, & eliquis.    Hypokalemia: WNL today    Hyponatremia: labile    DM2: well controlled, HbA1c 6.5. Continue on glargine, SSI w/ accuchecks    HLD: continue on statin    Weakness: PT recs  home health   Lactic acidosis: on presentation.   Discharge Instructions  Discharge Instructions     Diet - low sodium heart healthy   Complete by: As directed    Diet Carb Modified   Complete by: As directed    Discharge instructions   Complete by: As directed    F/u w/ cardio, Dr. Rockey Situ, in 2 weeks. BMP in 1 week. F/u w/ PCP in 1-2 weeks   Increase activity slowly   Complete by: As directed       Allergies as of 01/31/2021       Reactions   Cyclobenzaprine Hypertension   Cheese Other (See Comments)   bloating   Ciprofloxacin Hcl Other (See Comments)   Muscle pain   Coconut Oil    Upset stomach   Keflex [cephalexin] Other (See Comments)   Patient prefers not to take this medication due to the side effects, caused tendon issues        Medication List     STOP taking these medications    losartan-hydrochlorothiazide 100-25 MG tablet Commonly known as: HYZAAR       TAKE these medications    acetaminophen 650 MG CR tablet Commonly known as: TYLENOL Take 1,300 mg by mouth at bedtime.   ALFALFA PO Take 1 tablet by mouth daily.   amiodarone 200 MG tablet Commonly known as: PACERONE Take 1 tablet (200 mg total) by mouth 2 (two) times daily. Take for 2 weeks as per cardio & will f/u w/ Dr. Rockey Situ in 2 weeks What changed: additional instructions   apixaban 5 MG Tabs tablet Commonly known as: Eliquis Take 1 tablet (5 mg total) by mouth 2 (two) times daily.   ascorbic Acid 500 MG Cpcr Commonly known as: VITAMIN C Take 500 mg by mouth daily.   B-complex with vitamin C tablet Take 1 tablet by mouth 2 (two) times daily.   BD Pen Needle Nano U/F 32G X 4 MM Misc Generic drug: Insulin Pen Needle USE 4 TIMES DAILY (HUMALOG 3 TIMES DAILY AND LANTUS ONCE DAILY) OFFICE NOTIFIED 08/06/17   BLACK ELDERBERRY(BERRY-FLOWER) PO Take 15 mLs by mouth daily. Liquid   Bydureon BCise 2 MG/0.85ML Auij Generic drug: Exenatide ER Inject 2 mg into the skin every Sunday.    CALCIUM 600 PO Take 600 mg by mouth daily.   cholecalciferol 1000 units tablet Commonly known as: VITAMIN D Take 1,000 Units by mouth daily. Shaklee   CoQ10 200 MG Caps Take 200 mg by mouth daily.   fluconazole 100 MG tablet Commonly known as: DIFLUCAN Take 100 mg by mouth once as needed.   FreeStyle [employer] Misc by Does not apply route.   furosemide 40 MG tablet Commonly known as: LASIX Take 1 tablet (40 mg total) by mouth daily. Start taking on: February 01, 2021 What changed:  medication strength how much to take when to take this reasons to take this   GARLIC [id number] PO Take [id number] mg by mouth daily.   Glucosamine 500 MG Tabs Take 500 mg by mouth daily.   HumaLOG KwikPen 100 UNIT/ML KwikPen Generic drug: insulin lispro Inject 4-12 Units into  the skin 3 (three) times daily before meals. If needed sliding scale   insulin glargine 100 unit/mL Sopn Commonly known as: LANTUS Inject 22 Units into the skin at bedtime.   Krill Oil 1000 MG Caps Take 1,000 mg by mouth daily.   losartan 25 MG tablet Commonly known as: COZAAR Take 0.5 tablets (12.5 mg total) by mouth daily. Start taking on: February 01, 2021   Medical Compression Stockings Misc 1 each by Does not apply route daily.   NETI POT SINUS [place] NA Place 1 Dose into the nose at bedtime.   OVER THE COUNTER MEDICATION Place 1 application into both eyes See admin instructions. Blephadex eyelid foam, apply to eyelids once daily   OVER THE COUNTER MEDICATION Take 1 tablet by mouth daily. Vita-Lea Gold otc supplement With out vitamin K (Shaklee products)   OVER THE COUNTER MEDICATION Take 1 tablet by mouth See admin instructions. Nutriferon otc supplement take Take 1 tablet on Sun, Tues, Thurs, Sat. Take 1 tablet twice daily on Mon, Wed, and Fri   POTASSIUM & MAGNESIUM ASPARTAT PO Take 1 tablet by mouth daily.   PreserVision AREDS 2 Caps Take 1 capsule by mouth 2 (two) times daily.   PROBIOTIC  PO Take 1 capsule by mouth at bedtime.   Prolia 60 MG/ML Sosy injection Generic drug: denosumab Inject 60 mg into the skin every 6 (six) months.   SYSTANE BALANCE OP Place 1 drop into both eyes daily as needed (dry eyes).   tiZANidine 2 MG tablet Commonly known as: ZANAFLEX Take 1 tablet (2 mg total) by mouth at bedtime.   Vitamin D (Ergocalciferol) 1.25 MG (50000 UNIT) Caps capsule Commonly known as: DRISDOL Take 50,000 Units by mouth every Saturday.       ASK your doctor about these medications    clobetasol ointment 0.05 % Commonly known as: TEMOVATE APPLY 1 APPLICATION TOPICALLY 2 TIMES DAILY AS NEEDED (BUG BITES).   estradiol 0.1 MG/GM vaginal cream Commonly known as: ESTRACE VAGINAL 1/2 gm once weekly using applicator, apply blueberry sized amount of cream using tip of finger to urethra twice weekly   fluticasone 50 MCG/ACT nasal spray Commonly known as: FLONASE PLACE 2 SPRAYS INTO BOTH NOSTRILS DAILY.   rosuvastatin 10 MG tablet Commonly known as: CRESTOR Take 1 tablet (10 mg total) by mouth daily.        Allergies  Allergen Reactions   Cyclobenzaprine Hypertension   Cheese Other (See Comments)    bloating   Ciprofloxacin Hcl Other (See Comments)    Muscle pain   Coconut Oil     Upset stomach   Keflex [Cephalexin] Other (See Comments)    Patient prefers not to take this medication due to the side effects, caused tendon issues    Consultations: Cardio    Procedures/Studies: DG Chest 2 View  Result Date: 01/23/2021 CLINICAL DATA:  Shortness of breath and hypertension. EXAM: CHEST - 2 VIEW COMPARISON:  February 21, 2015 FINDINGS: The lungs are hyperinflated. Mild diffusely increased lung markings are noted. Mild areas of atelectasis and/or infiltrate are seen within the bilateral lung bases. There is no evidence of a pleural effusion or pneumothorax. The cardiac silhouette is borderline in size. Degenerative changes seen throughout the thoracic spine.  IMPRESSION: 1. Mild bibasilar atelectasis and/or infiltrate. 2. A very mild component of superimposed interstitial edema cannot be excluded. Electronically Signed   By: Virgina Norfolk M.D.   On: 01/23/2021 00:05   CARDIAC CATHETERIZATION  Result Date: 01/25/2021 Conclusions: Mild  to moderate, non-obstructive coronary artery disease, including 30% mid LAD disease, 50-60% mid/distal LCx stenosis, and 60% proximal/mid RCA lesion. Mildly to moderately reduced left ventricular systolic function with mid anterior hypo/akinesis; query atypical Takotsubo variant.  LVEF ~45%. Mildly elevated left heart filling pressures. Moderately elevated right heart filling and pulmonary artery pressures with significant pulmonary vascular resistance. Mildly reduced Fick cardiac output/index. Recommendations: Continue IV diuresis. Advance goal-directed medical therapy for cardiomyopathy, as tolerated.  Patient may benefit from outpatient advanced heart failure consultation. Increase metoprolol for rate control of atrial fibrillation. Restart IV heparin in 2 hours after TR band removal.  Transition back to apixaban tomorrow AM if no evidence of bleeding/vascular injury. Secondary prevention of coronary artery disease. Nelva Bush, MD [facility]  DG Chest Port 1 View  Result Date: 01/23/2021 CLINICAL DATA:  Respiratory distress. EXAM: PORTABLE CHEST 1 VIEW COMPARISON:  January 22, 2021 FINDINGS: Moderate to marked severity infiltrates are seen within the bilateral lung bases and bilateral upper lobes, right greater than left. This is increased in severity when compared to the prior study. There is no evidence of a pleural effusion or pneumothorax. The cardiac silhouette is borderline in size. Degenerative changes are seen throughout the thoracic spine. IMPRESSION: Moderate to marked severity bilateral infiltrates, increased in severity when compared to the prior study. Electronically Signed   By: Virgina Norfolk M.D.   On:  01/23/2021 03:17   ECHOCARDIOGRAM COMPLETE  Result Date: 01/23/2021    ECHOCARDIOGRAM REPORT   Patient Name:   Bonnie Rowland [facility] Date of Exam: 01/23/2021 Medical Rec #:  [id number]          Height:       63.0 in Accession #:    [id number]         Weight:       197.1 lb Date of Birth:  24-May-1941          BSA:          1.922 m Patient Age:    63 years           BP:           131/71 mmHg Patient Gender: F                  HR:           93 bpm. Exam Location:  [facility] Procedure: 2D Echo, Cardiac Doppler and Color Doppler Indications:     CHF-acute diastolic [id number]  History:         Patient has prior history of Echocardiogram examinations, most                  recent 10/20/2020. Risk Factors:Hypertension.  Sonographer:     Sherrie Sport RDCS (AE) Referring Phys:  [id number] Athena Masse Diagnosing Phys: Nelva Bush MD  Sonographer Comments: Technically challenging study due to limited acoustic windows, no apical window and no subcostal window. Pt on bipap---could not lie on left side due to pain. IMPRESSIONS  1. Left ventricular ejection fraction, by estimation, is 45 to 50%. The left ventricle has mildly decreased function. The left ventricle demonstrates regional wall motion abnormalities (see scoring diagram/findings for description). There is mild left ventricular hypertrophy. Left ventricular diastolic function could not be evaluated. There is severe hypokinesis of the left ventricular, entire anterior wall and anteroseptal wall.  2. Right ventricular systolic function is moderately reduced. The right ventricular size is normal. Mildly increased right ventricular wall thickness.  3. The mitral valve is  degenerative. Trivial mitral valve regurgitation.  4. The aortic valve is tricuspid. Aortic valve regurgitation not well assessed. FINDINGS  Left Ventricle: Left ventricular ejection fraction, by estimation, is 45 to 50%. The left ventricle has mildly decreased function. The left ventricle demonstrates regional wall  motion abnormalities. Severe hypokinesis of the left ventricular, entire anterior wall and anteroseptal wall. The left ventricular internal cavity size was normal in size. There is mild left ventricular hypertrophy. Left ventricular diastolic function could not be evaluated. Right Ventricle: The right ventricular size is normal. Mildly increased right ventricular wall thickness. Right ventricular systolic function is moderately reduced. Left Atrium: Left atrial size was not well visualized. Right Atrium: Right atrial size was not well visualized. Pericardium: Trivial pericardial effusion is present. Mitral Valve: The mitral valve is degenerative in appearance. There is mild thickening of the mitral valve leaflet(s). Mild mitral annular calcification. Trivial mitral valve regurgitation. Tricuspid Valve: The tricuspid valve is not well visualized. Tricuspid valve regurgitation is trivial. Aortic Valve: The aortic valve is tricuspid. Aortic valve regurgitation not well assessed. Pulmonic Valve: The pulmonic valve was not well visualized. Aorta: The aortic root is normal in size and structure. Pulmonary Artery: The pulmonary artery is not well seen. IAS/Shunts: The interatrial septum was not assessed. Additional Comments: There is pleural effusion in the left lateral region.  LEFT VENTRICLE PLAX 2D LVIDd:         3.74 cm LVIDs:         2.89 cm LV PW:         1.35 cm LV IVS:        1.13 cm LVOT diam:     2.00 cm LVOT Area:     3.14 cm  LEFT ATRIUM         Index LA diam:    4.00 cm 2.08 cm/m                        PULMONIC VALVE AORTA                 PV Vmax:        0.55 m/s Ao Root diam: 2.50 cm PV Peak grad:   1.2 mmHg                       RVOT Peak grad: 2 mmHg   SHUNTS Systemic Diam: 2.00 cm Nelva Bush MD Electronically signed by Nelva Bush MD Signature Date/Time: 01/23/2021/4:59:35 PM    Final    (Echo, Carotid, EGD, Colonoscopy, ERCP)    Subjective: Pt c/o fatigue   Discharge Exam: Vitals:    01/31/21 0808 01/31/21 1239  BP: (!) 133/58 118/84  Pulse: (!) 52 (!) 54  Resp: 20 18  Temp: 98.2 F (36.8 C) (!) 97.5 F (36.4 C)  SpO2: 97% 97%   Vitals:   01/31/21 0424 01/31/21 0600 01/31/21 0808 01/31/21 1239  BP: 117/65  (!) 133/58 118/84  Pulse: (!) 53  (!) 52 (!) 54  Resp: '16  20 18  '$ Temp: 97.6 F (36.4 C)  98.2 F (36.8 C) (!) 97.5 F (36.4 C)  TempSrc: Oral     SpO2: 97%  97% 97%  Weight:  83 kg    Height:        General: Pt is alert, awake, not in acute distress Cardiovascular: S1/S2 +, no rubs, no gallops Respiratory: CTA bilaterally, no wheezing, no rhonchi Abdominal: Soft, NT, ND, bowel sounds + Extremities:  no  cyanosis    The results of significant diagnostics from this hospitalization (including imaging, microbiology, ancillary and laboratory) are listed below for reference.     Microbiology: Recent Results (from the past 240 hour(s))  SARS CORONAVIRUS 2 (TAT 6-24 HRS) Nasopharyngeal Nasopharyngeal Swab     Status: None   Collection Time: 01/23/21 12:43 AM   Specimen: Nasopharyngeal Swab  Result Value Ref Range Status   SARS Coronavirus 2 NEGATIVE NEGATIVE Final    Comment: (NOTE) SARS-CoV-2 target nucleic acids are NOT DETECTED.  The SARS-CoV-2 RNA is generally detectable in upper and lower respiratory specimens during the acute phase of infection. Negative results do not preclude SARS-CoV-2 infection, do not rule out co-infections with other pathogens, and should not be used as the sole basis for treatment or other patient management decisions. Negative results must be combined with clinical observations, patient history, and epidemiological information. The expected result is Negative.  Fact Sheet for Patients: SugarRoll.be  Fact Sheet for Healthcare Providers: https://www.woods-mathews.com/  This test is not yet approved or cleared by the [place] FDA and  has been authorized for detection  and/or diagnosis of SARS-CoV-2 by FDA under an Emergency Use Authorization (EUA). This EUA will remain  in effect (meaning this test can be used) for the duration of the COVID-19 declaration under Se ction 564(b)(1) of the Act, 21 U.S.C. section 360bbb-3(b)(1), unless the authorization is terminated or revoked sooner.  Performed at [facility], [place] [address], [place], [place] [address]   Culture, blood (routine x 2)     Status: None   Collection Time: 01/23/21  4:02 AM   Specimen: BLOOD  Result Value Ref Range Status   Specimen Description BLOOD LEFT AC  Final   Special Requests   Final    BOTTLES DRAWN AEROBIC AND ANAEROBIC Blood Culture adequate volume   Culture   Final    NO GROWTH 8 DAYS Performed at [facility], [address] [place], [place] [address]    Report Status 01/31/2021 FINAL  Final  Culture, blood (routine x 2)     Status: None   Collection Time: 01/23/21  4:13 AM   Specimen: BLOOD  Result Value Ref Range Status   Specimen Description BLOOD LEFT HAND  Final   Special Requests   Final    BOTTLES DRAWN AEROBIC AND ANAEROBIC Blood Culture adequate volume   Culture   Final    NO GROWTH 8 DAYS Performed at [facility], [address] [place], [place] [address]    Report Status 01/31/2021 FINAL  Final     Labs: BNP (last 3 results) Recent Labs    01/22/21 2321  BNP [id number]*   Basic Metabolic Panel: Recent Labs  Lab 01/27/21 0439 01/28/21 0536 01/29/21 0659 01/30/21 0624 01/31/21 0703  NA 135 134* 136 134* 133*  K 3.3* 3.4* 3.5 4.3 5.0  CL 92* 94* 95* 96* 98  CO2 33* 32 '30 28 26  '$ GLUCOSE 204* 186* 167* 173* 142*  BUN '19 19 18 '$ 25* 26*  CREATININE 0.71 0.64 0.65 0.69 0.90  CALCIUM 8.0* 7.8* 8.2* 8.1* 8.0*  MG  --  2.1 2.0 2.0 2.0   Liver Function Tests: No results for input(s): AST, ALT, ALKPHOS, BILITOT, PROT, ALBUMIN in the last 168 hours. No results for input(s): LIPASE, AMYLASE in the last 168 hours. No results  for input(s): AMMONIA in the last 168 hours. CBC: Recent Labs  Lab 01/25/21 0450 01/26/21 0112 01/27/21 0439 01/30/21 [id number]  01/31/21 0703  WBC 11.0* 11.2* 10.0 9.4 8.4  HGB 12.9 12.9 13.9 14.2 13.9  HCT 37.8 37.7 39.5 41.1 41.1  MCV 94.5 94.5 95.4 93.6 94.7  PLT 188 189 202 275 285   Cardiac Enzymes: No results for input(s): CKTOTAL, CKMB, CKMBINDEX, TROPONINI in the last 168 hours. BNP: Invalid input(s): POCBNP CBG: Recent Labs  Lab 01/30/21 1239 01/30/21 1549 01/30/21 2212 01/31/21 0810 01/31/21 1241  GLUCAP 272* 182* 217* 183* 242*   D-Dimer No results for input(s): DDIMER in the last 72 hours. Hgb A1c No results for input(s): HGBA1C in the last 72 hours. Lipid Profile No results for input(s): CHOL, HDL, LDLCALC, TRIG, CHOLHDL, LDLDIRECT in the last 72 hours. Thyroid function studies No results for input(s): TSH, T4TOTAL, T3FREE, THYROIDAB in the last 72 hours.  Invalid input(s): FREET3 Anemia work up No results for input(s): VITAMINB12, FOLATE, FERRITIN, TIBC, IRON, RETICCTPCT in the last 72 hours. Urinalysis    Component Value Date/Time   COLORURINE YELLOW (A) 05/13/2020 1159   APPEARANCEUR CLEAR (A) 05/13/2020 1159   APPEARANCEUR Clear 03/03/2020 1133   LABSPEC 1.005 05/13/2020 1159   PHURINE 7.0 05/13/2020 1159   GLUCOSEU NEGATIVE 05/13/2020 1159   HGBUR NEGATIVE 05/13/2020 1159   BILIRUBINUR NEGATIVE 05/13/2020 1159   BILIRUBINUR Negative 03/03/2020 1133   KETONESUR NEGATIVE 05/13/2020 1159   PROTEINUR NEGATIVE 05/13/2020 1159   UROBILINOGEN 0.2 01/16/2016 0955   NITRITE NEGATIVE 05/13/2020 1159   LEUKOCYTESUR NEGATIVE 05/13/2020 1159   Sepsis Labs Invalid input(s): PROCALCITONIN,  WBC,  LACTICIDVEN Microbiology Recent Results (from the past 240 hour(s))  SARS CORONAVIRUS 2 (TAT 6-24 HRS) Nasopharyngeal Nasopharyngeal Swab     Status: None   Collection Time: 01/23/21 12:43 AM   Specimen: Nasopharyngeal Swab  Result Value Ref Range Status    SARS Coronavirus 2 NEGATIVE NEGATIVE Final    Comment: (NOTE) SARS-CoV-2 target nucleic acids are NOT DETECTED.  The SARS-CoV-2 RNA is generally detectable in upper and lower respiratory specimens during the acute phase of infection. Negative results do not preclude SARS-CoV-2 infection, do not rule out co-infections with other pathogens, and should not be used as the sole basis for treatment or other patient management decisions. Negative results must be combined with clinical observations, patient history, and epidemiological information. The expected result is Negative.  Fact Sheet for Patients: SugarRoll.be  Fact Sheet for Healthcare Providers: https://www.woods-mathews.com/  This test is not yet approved or cleared by the [place] FDA and  has been authorized for detection and/or diagnosis of SARS-CoV-2 by FDA under an Emergency Use Authorization (EUA). This EUA will remain  in effect (meaning this test can be used) for the duration of the COVID-19 declaration under Se ction 564(b)(1) of the Act, 21 U.S.C. section 360bbb-3(b)(1), unless the authorization is terminated or revoked sooner.  Performed at [facility], [place] [address] [place], [place] [address]   Culture, blood (routine x 2)     Status: None   Collection Time: 01/23/21  4:02 AM   Specimen: BLOOD  Result Value Ref Range Status   Specimen Description BLOOD LEFT [facility]  Final   Special Requests   Final    BOTTLES DRAWN AEROBIC AND ANAEROBIC Blood Culture adequate volume   Culture   Final    NO GROWTH 8 DAYS Performed at [facility], [address] [place], [place] [address]    Report Status 01/31/2021 FINAL  Final  Culture, blood (routine x 2)     Status: None  Collection Time: 01/23/21  4:13 AM   Specimen: BLOOD  Result Value Ref Range Status   Specimen Description BLOOD LEFT HAND  Final   Special Requests   Final    BOTTLES DRAWN AEROBIC AND  ANAEROBIC Blood Culture adequate volume   Culture   Final    NO GROWTH 8 DAYS Performed at [facility], [address] [place], [place] [address]    Report Status 01/31/2021 FINAL  Final     Time coordinating discharge: Over 30 minutes  SIGNED:   Wyvonnia Dusky, MD  [facility] 01/31/2021, 3:40 PM Pager   If 7PM-7AM, please contact night-coverage

## 2021-02-01 ENCOUNTER — Telehealth: Payer: Self-pay | Admitting: *Deleted

## 2021-02-01 ENCOUNTER — Telehealth: Payer: Self-pay | Admitting: Family Medicine

## 2021-02-01 ENCOUNTER — Telehealth: Payer: Self-pay

## 2021-02-01 DIAGNOSIS — I110 Hypertensive heart disease with heart failure: Secondary | ICD-10-CM | POA: Diagnosis not present

## 2021-02-01 DIAGNOSIS — E785 Hyperlipidemia, unspecified: Secondary | ICD-10-CM | POA: Diagnosis not present

## 2021-02-01 DIAGNOSIS — M810 Age-related osteoporosis without current pathological fracture: Secondary | ICD-10-CM | POA: Diagnosis not present

## 2021-02-01 DIAGNOSIS — M1990 Unspecified osteoarthritis, unspecified site: Secondary | ICD-10-CM | POA: Diagnosis not present

## 2021-02-01 DIAGNOSIS — J9601 Acute respiratory failure with hypoxia: Secondary | ICD-10-CM | POA: Diagnosis not present

## 2021-02-01 DIAGNOSIS — J40 Bronchitis, not specified as acute or chronic: Secondary | ICD-10-CM | POA: Diagnosis not present

## 2021-02-01 DIAGNOSIS — M5417 Radiculopathy, lumbosacral region: Secondary | ICD-10-CM | POA: Diagnosis not present

## 2021-02-01 DIAGNOSIS — I5043 Acute on chronic combined systolic (congestive) and diastolic (congestive) heart failure: Secondary | ICD-10-CM | POA: Diagnosis not present

## 2021-02-01 DIAGNOSIS — I701 Atherosclerosis of renal artery: Secondary | ICD-10-CM | POA: Diagnosis not present

## 2021-02-01 DIAGNOSIS — I2510 Atherosclerotic heart disease of native coronary artery without angina pectoris: Secondary | ICD-10-CM | POA: Diagnosis not present

## 2021-02-01 DIAGNOSIS — E1169 Type 2 diabetes mellitus with other specified complication: Secondary | ICD-10-CM | POA: Diagnosis not present

## 2021-02-01 DIAGNOSIS — K219 Gastro-esophageal reflux disease without esophagitis: Secondary | ICD-10-CM | POA: Diagnosis not present

## 2021-02-01 DIAGNOSIS — Z9181 History of falling: Secondary | ICD-10-CM | POA: Diagnosis not present

## 2021-02-01 DIAGNOSIS — I480 Paroxysmal atrial fibrillation: Secondary | ICD-10-CM | POA: Diagnosis not present

## 2021-02-01 DIAGNOSIS — Z7901 Long term (current) use of anticoagulants: Secondary | ICD-10-CM | POA: Diagnosis not present

## 2021-02-01 NOTE — Telephone Encounter (Signed)
To scheduling to arrange for a follow up appointment.  BMP order placed.  Nursing to reach out to the patient once her follow up appointment is made.   Patient discharged on 01/31/21:  Hypertensive urgency PAF s/p DCCV on 01/30/21 (on amiodarone) CHF Hypokalemia

## 2021-02-01 NOTE — Telephone Encounter (Signed)
Transition Care Management Follow-up Telephone Call Date of discharge and from where: 01/31/21 [facility] How have you been since you were released from the hospital? Pt states she is doing okay Any questions or concerns? Yes - pt wanted to confirm dosing for amiodarone 200 mg BID for 2 weeks; pt states normal resting heart rate is 65 and today it was 49 & 51 when checked by [place]. Pt states she felt fine but wanted provider to be aware and will follow up with [name] NP tomorrow.   Items Reviewed: Did the pt receive and understand the discharge instructions provided? Yes  Medications obtained and verified? Yes  Other? No  Any new allergies since your discharge? No  Dietary orders reviewed? Yes Do you have support at home? Yes   Home Care and Equipment/Supplies: Were home health services ordered? yes If so, what is the name of the agency? [facility]   Has the agency set up a time to come to the patient's home? yes Were any new equipment or medical supplies ordered?  No  Functional Questionnaire: (I = Independent and D = Dependent) ADLs: I with assistance  Bathing/Dressing- D  Meal Prep- D  Eating- I  Maintaining continence- I  Transferring/Ambulation- I  Managing Meds- I  Follow up appointments reviewed:  PCP Hospital f/u appt confirmed? Yes  Scheduled to see Dr. Ancil Boozer on 02/09/21 @ 11:20. [facility] f/u appt confirmed? Yes  Scheduled to see [facility] with [name] on 02/02/21 & cardiology with [name] [facility] on 02/10/21. . Are transportation arrangements needed? No  If their condition worsens, is the pt aware to call PCP or go to the Emergency Dept.? Yes Was the patient provided with contact information for the PCP's office or ED? Yes Was to pt encouraged to call back with questions or concerns? Yes

## 2021-02-01 NOTE — Telephone Encounter (Signed)
Patient contacted regarding discharge from [facility] on 01/31/21.  Patient understands to follow up with provider Bonnie Rowland on 02/10/21 at 1430 at [facility]. Patient understands discharge instructions? yes Patient understands medications and regiment? Yes, we reviewed all medication instructions provided in the discharge summary.   Patient understands to bring all medications to this visit? yes  Ask patient:  Are you enrolled in My Chart: yes               Do you have help at home with ADL's?  Husband and house keeper have been helping. PT to start as well.  Patient stated that she had a very pleasant experience with all the providers and team members during her hospital stay and was very grateful to our office for the quick follow up.

## 2021-02-01 NOTE — Progress Notes (Signed)
Patient ID: Bonnie Rowland, female    DOB: [birth year], 80 y.o.   MRN: [id number]  HPI  Ms Slutz is a 80 y/o female with a history of HTN, DM, hyperlipidemia, atrial fibrillation, GERD and chronic heart failure.   Echo report from 01/23/21 reviewed and showed an EF of 45-50% with mild LVH, severe hypokinesis of left ventricle and trivial MR.   RHC/LHC done 01/25/21 and showed: Mild to moderate, non-obstructive coronary artery disease, including 30% mid LAD disease, 50-60% mid/distal LCx stenosis, and 60% proximal/mid RCA lesion. Mildly to moderately reduced left ventricular systolic function with mid anterior hypo/akinesis; query atypical Takotsubo variant.  LVEF ~45%. Mildly elevated left heart filling pressures. Moderately elevated right heart filling and pulmonary artery pressures with significant pulmonary vascular resistance. Mildly reduced Fick cardiac output/index.  Admitted 01/23/21 due to HF exacerbation along with HTN. Initially given IV lasix and IV morphine and then transitioned to oral diuretics. Had to be placed on bipap. Cardiology consult obtained. Successfully cardioverted 01/30/21. Beta-blocker held. Elevated troponin thought to be due to demand ischemia. Discharged after 8 days.   She presents today for her initial visit with a chief complaint of minimal shortness of breath upon moderate exertion. She describes this as chronic in nature having been present for several months. She has associated dry cough, pedal edema, weakness & back pain along with this. She denies any dizziness, abdominal distention, palpitations, chest pain, fatigue or weight gain.   She uses her walker to walk. Will be having PT twice/ week and once weekly nursing starting next week.   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; left shoulder blade   Cancer ([place]) 05/2014   basal cell removed left temple   Cataract 2003   Cellulitis and abscess of leg, except foot    Conjunctivitis unspecified    Dermatophytosis of nail    Diabetes mellitus    type II   Esophageal reflux    Hyperlipidemia    Hypertension    Osteoporosis 2016   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)    Renal artery stenosis (HCC)    Sprain of lumbar region    Thoracic or lumbosacral neuritis or radiculitis, unspecified    Urinary tract infection, site not specified    Past Surgical History:  Procedure Laterality Date   APPENDECTOMY     CARDIAC CATHETERIZATION     CARDIOVERSION N/A 11/30/2020   Procedure: CARDIOVERSION;  Surgeon: Minna Merritts, MD;  Location: [facility] ORS;  Service: Cardiovascular;  Laterality: N/A;   CARDIOVERSION N/A 01/20/2021   Procedure: CARDIOVERSION;  Surgeon: Minna Merritts, MD;  Location: [place] [facility];  Service: Cardiovascular;  Laterality: N/A;   CARDIOVERSION N/A 01/30/2021   Procedure: CARDIOVERSION;  Surgeon: Wellington Hampshire, MD;  Location: [facility] ORS;  Service: Cardiovascular;  Laterality: N/A;   cataract surgery     COLONOSCOPY     COLONOSCOPY WITH PROPOFOL N/A 05/09/2016   Procedure: COLONOSCOPY WITH PROPOFOL;  Surgeon: Robert Bellow, MD;  Location: [facility] ENDOSCOPY;  Service: Endoscopy;  Laterality: N/A;   DIAGNOSTIC MAMMOGRAM     EYE SURGERY Bilateral  Cataract Extraction   JOINT REPLACEMENT  2016   KNEE ARTHROPLASTY Right 03/30/2015   Procedure: COMPUTER ASSISTED TOTAL KNEE ARTHROPLASTY;  Surgeon: Dereck Leep, MD;  Location: [facility] ORS;  Service: Orthopedics;  Laterality: Right;   KNEE ARTHROSCOPY Right    KNEE CLOSED REDUCTION Right 05/23/2015   Procedure: CLOSED MANIPULATION KNEE;  Surgeon: Dereck Leep, MD;  Location: [facility] ORS;  Service:  Orthopedics;  Laterality: Right;   RIGHT/LEFT HEART CATH AND CORONARY ANGIOGRAPHY N/A 01/25/2021   Procedure: RIGHT/LEFT HEART CATH AND CORONARY ANGIOGRAPHY;  Surgeon: Nelva Bush, MD;  Location: [place] [facility];  Service: Cardiovascular;  Laterality: N/A;   TONSILLECTOMY     Family History  Problem Relation Age of Onset   Diabetes Mother    Hypertension Mother    Cancer Mother    Kidney disease Mother    Hypertension Father    Cancer Father        Prostate   Breast cancer Maternal Aunt 63   Colon cancer Son    Kidney cancer Neg Hx    Bladder Cancer Neg Hx    Social History   Tobacco Use   Smoking status: Never   Smokeless tobacco: Never  Substance Use Topics   Alcohol use: Not Currently    Alcohol/week: 1.0 standard drink    Types: 1 Glasses of wine per week   Allergies  Allergen Reactions   Cyclobenzaprine Hypertension   Cheese Other (See Comments)    bloating   Ciprofloxacin Hcl Other (See Comments)    Muscle pain   Coconut Oil     Upset stomach   Keflex [Cephalexin] Other (See Comments)    Patient prefers not to take this medication due to the side effects, caused tendon issues   Prior to Admission medications   Medication Sig Start Date End Date Taking? Authorizing Provider  acetaminophen (TYLENOL) 650 MG CR tablet Take 1,300 mg by mouth at bedtime.   Yes [provider]  ALFALFA PO Take 1 tablet by mouth daily.   Yes [provider]  amiodarone (PACERONE) 200 MG tablet Take 1 tablet (200 mg total) by mouth 2 (two) times daily. Take for 2 weeks as per cardio & will f/u w/ Dr. Rockey Situ in 2 weeks 01/31/21  Yes Wyvonnia Dusky, MD  apixaban (ELIQUIS) 5 MG TABS tablet Take 1 tablet (5 mg total) by mouth 2 (two) times daily. 09/26/20  Yes Gollan, Kathlene November, MD  ascorbic Acid (VITAMIN C) 500 MG CPCR Take 500 mg by mouth daily.   Yes [provider]  B Complex-C (B-COMPLEX WITH VITAMIN C) tablet Take 1 tablet by mouth 2 (two) times  daily.   Yes [provider]  BD PEN NEEDLE NANO U/F 32G X 4 MM MISC USE 4 TIMES DAILY (HUMALOG 3 TIMES DAILY AND LANTUS ONCE DAILY) OFFICE NOTIFIED 08/06/17 12/07/18  Yes Sowles, Drue Stager, MD  BLACK ELDERBERRY,BERRY-FLOWER, PO Take 15 mLs by mouth daily. Liquid   Yes [provider]  Calcium Carbonate (CALCIUM 600 PO) Take 600 mg by mouth daily.   Yes [provider]  cholecalciferol (VITAMIN D) 1000 UNITS tablet Take 1,000 Units by mouth daily. [place]   Yes [provider]  clobetasol ointment (TEMOVATE) [id number] % APPLY 1 APPLICATION TOPICALLY 2 TIMES DAILY AS NEEDED (BUG BITES). Patient taking differently: Apply 1 application topically daily as needed (Bug bites). 12/06/20  Yes Sowles, Drue Stager, MD  Coenzyme Q10 (COQ10) 200 MG CAPS Take 200 mg by mouth daily.  Yes [provider]  Continuous Blood Gluc Sensor ([place]) MISC by Does not apply route.   Yes [provider]  Elastic Bandages & Supports (MEDICAL COMPRESSION STOCKINGS) [place] 1 each by Does not apply route daily. 01/08/19  Yes Sowles, Drue Stager, MD  estradiol (ESTRACE VAGINAL) 0.1 MG/GM vaginal cream 1/2 gm once weekly using applicator, apply blueberry sized amount of cream using tip of finger to urethra twice weekly Patient taking differently: Place 1 Applicatorful vaginally See admin instructions. 1/2 gm once weekly using applicator, apply blueberry sized amount of cream using tip of finger to urethra three weekly 06/10/20  Yes McGowan, Larene Beach A, [name]  Exenatide ER (BYDUREON BCISE) 2 MG/0.85ML AUIJ Inject 2 mg into the skin every Sunday. 02/26/20  Yes [provider]  fluticasone (FLONASE) 50 MCG/ACT nasal spray PLACE 2 SPRAYS INTO BOTH NOSTRILS DAILY. Patient taking differently: Place 2 sprays into both nostrils daily as needed for rhinitis. 08/16/16  Yes Sowles, Drue Stager, MD  furosemide (LASIX) 40 MG tablet Take 1 tablet (40 mg total) by mouth daily. 02/01/21 03/03/21  Yes Wyvonnia Dusky, MD  GARLIC [id number] PO Take [id number] mg by mouth daily.   Yes [provider]  Glucosamine 500 MG TABS Take 500 mg by mouth daily.   Yes [provider]  insulin glargine (LANTUS) 100 unit/mL SOPN Inject 22 Units into the skin at bedtime.   Yes [provider]  Javier Docker Oil 1000 MG CAPS Take 1,000 mg by mouth daily.   Yes [provider]  losartan (COZAAR) 25 MG tablet Take 0.5 tablets (12.5 mg total) by mouth daily. 02/01/21 03/03/21 Yes Wyvonnia Dusky, MD  Mag Aspart-Potassium Aspart (POTASSIUM & MAGNESIUM ASPARTAT PO) Take 1 tablet by mouth daily.   Yes [provider]  Multiple Vitamins-Minerals (PRESERVISION AREDS 2) CAPS Take 1 capsule by mouth 2 (two) times daily.   Yes [provider]  OVER THE COUNTER MEDICATION Place 1 application into both eyes See admin instructions. Blephadex eyelid foam, apply to eyelids once daily   Yes [provider]  OVER THE COUNTER MEDICATION Take 1 tablet by mouth daily. Vita-Lea Gold otc supplement With out vitamin K ([place] products)   Yes [provider]  OVER THE COUNTER MEDICATION Take 1 tablet by mouth See admin instructions. Nutriferon otc supplement take Take 1 tablet on Sun, Tues, Thurs, Sat. Take 1 tablet twice daily on Mon, Wed, and Fri   Yes [provider]  Probiotic Product (PROBIOTIC PO) Take 1 capsule by mouth at bedtime.   Yes [provider]  PROLIA 60 MG/ML SOSY injection Inject 60 mg into the skin every 6 (six) months. 09/02/19  Yes [provider]  Propylene Glycol (SYSTANE BALANCE OP) Place 1 drop into both eyes daily as needed (dry eyes).   Yes [provider]  rosuvastatin (CRESTOR) 10 MG tablet Take 1 tablet (10 mg total) by mouth daily. Patient taking differently: Take 10 mg by mouth at bedtime. 01/16/21  Yes Gollan, Kathlene November, MD  Sodium Chloride-Sodium Bicarb (NETI POT SINUS [place] NA) Place 1 Dose into the nose at  bedtime.   Yes [provider]  tiZANidine (ZANAFLEX) 2 MG tablet Take 1 tablet (2 mg total) by mouth at bedtime. 01/25/21  Yes Sowles, Drue Stager, MD  Vitamin D, Ergocalciferol, (DRISDOL) 1.25 MG (50000 UT) CAPS capsule Take 50,000 Units by mouth every Saturday. 11/23/18  Yes [provider]  HUMALOG KWIKPEN 100 UNIT/ML KiwkPen Inject 4-12 Units into  the skin 3 (three) times daily before meals. If needed sliding scale Patient not taking: Reported on 02/02/2021 10/15/17   [provider]  metoprolol succinate (TOPROL-XL) 50 MG 24 hr tablet Take 1 tablet (50 mg total) by mouth 2 (two) times daily. Take with or immediately following a meal. Patient not taking: Reported on 01/23/2021 01/16/21 01/23/21  Minna Merritts, MD   Review of Systems  Constitutional:  Negative for appetite change and fatigue.  HENT:  Negative for congestion, postnasal drip and sore throat.   Eyes: Negative.   Respiratory:  Positive for cough (dry cough) and shortness of breath (mild). Negative for chest tightness.   Cardiovascular:  Positive for leg swelling. Negative for chest pain and palpitations.  Gastrointestinal:  Negative for abdominal distention and abdominal pain.  Endocrine: Negative.   Genitourinary: Negative.   Musculoskeletal:  Positive for back pain. Negative for neck pain.  Skin: Negative.   Allergic/Immunologic: Negative.   Neurological:  Positive for weakness. Negative for dizziness and light-headedness.  Hematological:  Negative for adenopathy. Bruises/bleeds easily.  Psychiatric/Behavioral:  Positive for sleep disturbance (sleeping in recliner due to comfort). Negative for dysphoric mood. The patient is not nervous/anxious.    Vitals:   02/02/21 1334  BP: (!) 149/46  Pulse: 67  Resp: 16  SpO2: 99%  Weight: 184 lb (83.5 kg)  Height: '5\' 3"'$  (1.6 m)   Wt Readings from Last 3 Encounters:  02/02/21 184 lb (83.5 kg)  01/31/21 182 lb 14.4 oz (83 kg)  01/20/21 197 lb 1.5 oz (89.4  kg)   Lab Results  Component Value Date   CREATININE 0.90 01/31/2021   CREATININE 0.69 01/30/2021   CREATININE 0.65 01/29/2021    Physical Exam Vitals and nursing note reviewed. Exam conducted with a chaperone present (husband).  Constitutional:      Appearance: Normal appearance.  HENT:     Head: Normocephalic and atraumatic.  Cardiovascular:     Rate and Rhythm: Normal rate and regular rhythm.  Pulmonary:     Effort: Pulmonary effort is normal. No respiratory distress.     Breath sounds: No wheezing or rales.  Abdominal:     General: Abdomen is flat. There is no distension.     Palpations: Abdomen is soft.  Musculoskeletal:        General: No tenderness.     Cervical back: Normal range of motion and neck supple.     Right lower leg: Edema (trace pitting) present.     Left lower leg: Edema (trace pitting) present.  Skin:    General: Skin is warm and dry.  Neurological:     General: No focal deficit present.     Mental Status: She is alert and oriented to person, place, and time.  Psychiatric:        Mood and Affect: Mood normal.        Behavior: Behavior normal.        Thought Content: Thought content normal.    Assessment & Plan:  1: Chronic heart failure with mildly reduced ejection fraction- - NYHA class II - euvolemic today - weighing daily; reminded to call for an overnight weight gain of > 2 pounds or a weekly weight gain of > 5 pounds - not adding salt and has been reading food labels; low sodium cookbook provided today - on GDMT of losartan - consider changing losartan to entresto and/ or adding other GDMT therapy; will defer for right now since patient has only been home for  2 days on this medication regimen - will be having PT and nursing starting next week - has compression socks that she's going to resume wearing - BNP 01/22/21 was 237.3  2: HTN- - BP mildly elevated (149/46) - saw PCP Ancil Boozer) 01/09/21; returns 02/09/21 - BMP 01/31/21 reviewed and showed  sodium 133, potassium 5.0, creatinine 0.9 & GFR >60  3: DM- - A1c 01/09/21 was 6.5% - glucose at home today was 120  4: Paroxysmal atrial fibrillation- - successfully cardioverted 01/30/21 - saw cardiology Rockey Situ) 01/16/21; returns 02/10/21   Patient did not bring her medications nor a list. Each medication was verbally reviewed with the patient and she was encouraged to bring the bottles to every visit to confirm accuracy of list.   Return in 2 months or sooner for any questions/problems before then.

## 2021-02-01 NOTE — Telephone Encounter (Signed)
TCM....   Discharged 8/2     Patient aware [facility]  They are scheduled to see Thurmond Butts 8/12 at 230  They were seen for Hypertensive urgency PAF s/p DCCV on 01/30/21 (on amiodarone) CHF Hypokalemia   They need to be seen within 2 weeks   Patient has medication questions from dc instructions      Please call

## 2021-02-01 NOTE — Telephone Encounter (Signed)
Home Health Verbal Orders - Caller/Agency: [facility] Number: [phone] Requesting OT/PT/Skilled Nursing/Social Work/Speech Therapy: Nursing order 1w9  2 prn

## 2021-02-01 NOTE — Telephone Encounter (Signed)
-----   Message from Nelva Bush, MD sent at 01/31/2021  5:57 PM EDT ----- Regarding: Hosp f/u Hello, could you arrange for Ms. Raffo to have a BMP in 1 week and follow-up with Dr. Rockey Situ or an APP in 2 weeks?  Thanks.  Chris End

## 2021-02-02 ENCOUNTER — Encounter: Payer: Self-pay | Admitting: Family

## 2021-02-02 ENCOUNTER — Other Ambulatory Visit: Payer: Self-pay

## 2021-02-02 ENCOUNTER — Ambulatory Visit: Payer: Medicare Other | Attending: Family | Admitting: Family

## 2021-02-02 ENCOUNTER — Telehealth: Payer: Self-pay | Admitting: Family Medicine

## 2021-02-02 VITALS — BP 149/46 | HR 67 | Resp 16 | Ht 63.0 in | Wt 184.0 lb

## 2021-02-02 DIAGNOSIS — E1169 Type 2 diabetes mellitus with other specified complication: Secondary | ICD-10-CM

## 2021-02-02 DIAGNOSIS — K219 Gastro-esophageal reflux disease without esophagitis: Secondary | ICD-10-CM | POA: Diagnosis not present

## 2021-02-02 DIAGNOSIS — Z7983 Long term (current) use of bisphosphonates: Secondary | ICD-10-CM | POA: Diagnosis not present

## 2021-02-02 DIAGNOSIS — Z7901 Long term (current) use of anticoagulants: Secondary | ICD-10-CM | POA: Diagnosis not present

## 2021-02-02 DIAGNOSIS — I480 Paroxysmal atrial fibrillation: Secondary | ICD-10-CM | POA: Diagnosis not present

## 2021-02-02 DIAGNOSIS — E785 Hyperlipidemia, unspecified: Secondary | ICD-10-CM

## 2021-02-02 DIAGNOSIS — E119 Type 2 diabetes mellitus without complications: Secondary | ICD-10-CM | POA: Insufficient documentation

## 2021-02-02 DIAGNOSIS — Z96651 Presence of right artificial knee joint: Secondary | ICD-10-CM | POA: Diagnosis not present

## 2021-02-02 DIAGNOSIS — J9601 Acute respiratory failure with hypoxia: Secondary | ICD-10-CM | POA: Diagnosis not present

## 2021-02-02 DIAGNOSIS — Z881 Allergy status to other antibiotic agents status: Secondary | ICD-10-CM | POA: Diagnosis not present

## 2021-02-02 DIAGNOSIS — I110 Hypertensive heart disease with heart failure: Secondary | ICD-10-CM | POA: Insufficient documentation

## 2021-02-02 DIAGNOSIS — Z8249 Family history of ischemic heart disease and other diseases of the circulatory system: Secondary | ICD-10-CM | POA: Insufficient documentation

## 2021-02-02 DIAGNOSIS — Z794 Long term (current) use of insulin: Secondary | ICD-10-CM | POA: Insufficient documentation

## 2021-02-02 DIAGNOSIS — I701 Atherosclerosis of renal artery: Secondary | ICD-10-CM | POA: Diagnosis not present

## 2021-02-02 DIAGNOSIS — I2510 Atherosclerotic heart disease of native coronary artery without angina pectoris: Secondary | ICD-10-CM | POA: Diagnosis not present

## 2021-02-02 DIAGNOSIS — Z79899 Other long term (current) drug therapy: Secondary | ICD-10-CM | POA: Insufficient documentation

## 2021-02-02 DIAGNOSIS — I5022 Chronic systolic (congestive) heart failure: Secondary | ICD-10-CM

## 2021-02-02 DIAGNOSIS — I5043 Acute on chronic combined systolic (congestive) and diastolic (congestive) heart failure: Secondary | ICD-10-CM | POA: Diagnosis not present

## 2021-02-02 DIAGNOSIS — I10 Essential (primary) hypertension: Secondary | ICD-10-CM

## 2021-02-02 NOTE — Telephone Encounter (Signed)
Verbal orders given  

## 2021-02-02 NOTE — Telephone Encounter (Signed)
Home Health Verbal Orders - Caller/Agency: [place]  Requesting OT/PT/Skilled Nursing/Social Work/Speech Therapy: PT  Frequency: 1w1,2w4,1w4

## 2021-02-02 NOTE — Patient Instructions (Signed)
Continue weighing daily and call for an overnight weight gain of > 2 pounds or a weekly weight gain of >5 pounds. 

## 2021-02-03 NOTE — Telephone Encounter (Signed)
Verbal orders given  

## 2021-02-04 NOTE — Progress Notes (Signed)
Cardiology Office Note    Date:  02/10/2021   ID:  Bonnie, Rowland 1940/07/16, MRN [id number]  PCP:  Steele Sizer, MD  Cardiologist:  Ida Rogue, MD  Electrophysiologist:  None   Chief Complaint: Hospital follow up  History of Present Illness:   Bonnie Rowland is a 80 y.o. female with history of nonobstructive CAD by [facility] in 12/2020, chronic combined systolic and diastolic CHF, NICM, persistent A. Fib status post DCCV on 11/30/2020 with recurrent Afib s/p repeat DCCV on amiodarone 01/20/2021 s/p repeat DCCV on 01/30/2021, pulmonary hypertension, postoperative atrial flutter in 01/2015, less than 50% left renal artery stenosis by imaging in 2021, 1 to 39% bilateral ICA carotid stenosis by imaging in 2017, DM2, HTN, HLD, and obesity who presents for hospital follow up as outlined below.  She underwent LHC in 09/2005 which showed normal coronary arteries.  She was diagnosed with atrial flutter with RVR in 01/2015 after presenting for a total right knee replacement with spontaneous conversion to sinus rhythm.  She had been maintained on flecainide briefly.  More recently, she was seen in 08/2020 for routine 69-monthfollow-up and noted to be in asymptomatic A. fib with titration of metoprolol and addition of Eliquis at that time.  Echo in 09/2020 demonstrated an EF of 60 to 65%, no regional wall motion abnormalities, indeterminate LV diastolic function parameters, normal RV systolic function and ventricular cavity size, normal PASP, mildly dilated left atrium, and mild mitral regurgitation.  She subsequently underwent successful DCCV at 150 J and 200 J on 11/30/2020 with conversion to sinus rhythm.  When she was seen in follow up on 12/22/2020, she was noted to be back in Afib with controlled ventricular response.  In this setting, she was loaded with amiodarone and subsequently underwent repeat successful DCCV on 01/20/2021.    [facility]  failure and pulmonary edema in the setting of new LV dysfunction and recurrent A. fib.  High-sensitivity troponin peaked at 692.  BNP 237.  Echo showed an EF of 45 to 50%, mild LVH, severe hypokinesis of the entire anterior and anteroseptal wall, moderately reduced RV systolic function with normal RV cavity size and mildly increased RV wall thickness, degenerative mitral valve with trivial regurgitation.   R/LHC showed mild to moderate, nonobstructive CAD including 30% mid LAD stenosis, 50 to 60% mid to distal LCx stenosis, and 60% proximal to mid RCA stenosis.  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  Moderately elevated right heart filling and pulmonary artery pressures with significant pulmonary vascular resistance and mildly reduced cardiac output and index.  She was diuresed, placed on tolerated GDMT, and underwent successful repeat DCCV with symptomatic improvement.  At time of discharge, we recommended she take Lasix 40 mg daily, losartan 12.5 mg, amiodarone 200 mg twice daily, Eliquis 5 mg twice daily, and Crestor 10 mg.  Metoprolol was recommended to be held until she is seen in follow-up given prior noted bradycardia.  She comes in doing well from a cardiac perspective.  She does continue to have some fatigue, though is working with PT and home health.  No angina or worsening dyspnea.  No palpitations, dizziness, presyncope, or syncope.  No falls, hematochezia, or melena.  She is tolerating cardiac medications without issues.  Her weight remains stable at home running around 181 pounds.  BP  is typically in the [id number] to [id number] systolic.  Heart rates in the low to mid 60s bpm typically.  She has had some difficulty in cutting the losartan tabs in half and began taking 25 mg of losartan yesterday without issues.  No significant lower extremity swelling, abdominal  distention, orthopnea.   Labs independently reviewed: 01/2021 - potassium 5.0, BUN 26, serum creatinine 0.9, Hgb 13.9, PLT 285, magnesium 2.0 12/2020 - albumin 4.0, AST 42, ALT normal, TSH 4.83, A1c 6.5 07/2020 - TC 130, TG 86, HDL 75, LDL 38  Past Medical History:  Diagnosis Date   Acute cystitis    Acute on chronic combined systolic and diastolic CHF (congestive heart failure) (HCC)    Allergic rhinitis, cause unspecified    Arthritis 2015   Atherosclerosis of renal artery (HCC)    left   Atrial fibrillation (HCC)    Bronchitis, not specified as acute or chronic    Cancer ([place]) 12/2013   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; left shoulder blade   Cancer (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)    Renal artery stenosis (HCC)    Sprain of lumbar region    Thoracic or lumbosacral neuritis or radiculitis, unspecified    Urinary tract infection, site not specified     Past Surgical History:  Procedure Laterality Date   APPENDECTOMY     CARDIAC CATHETERIZATION     CARDIOVERSION N/A 11/30/2020   Procedure: CARDIOVERSION;  Surgeon: Minna Merritts, MD;  Location: [facility] ORS;  Service: Cardiovascular;  Laterality: N/A;   CARDIOVERSION N/A 01/20/2021   Procedure: CARDIOVERSION;  Surgeon: Minna Merritts, MD;  Location: [facility] ORS;  Service: Cardiovascular;  Laterality: N/A;   CARDIOVERSION N/A 01/30/2021   Procedure: CARDIOVERSION;  Surgeon: Wellington Hampshire, MD;  Location: [facility] ORS;  Service: Cardiovascular;  Laterality: N/A;   cataract surgery     COLONOSCOPY     COLONOSCOPY WITH PROPOFOL N/A 05/09/2016   Procedure: COLONOSCOPY WITH PROPOFOL;  Surgeon: Robert Bellow, MD;  Location: [facility];  Service: Endoscopy;  Laterality: N/A;    DIAGNOSTIC MAMMOGRAM     EYE SURGERY Bilateral    Cataract Extraction   JOINT REPLACEMENT  2016   KNEE ARTHROPLASTY Right 03/30/2015   Procedure: COMPUTER ASSISTED TOTAL KNEE ARTHROPLASTY;  Surgeon: Dereck Leep, MD;  Location: [facility] ORS;  Service: Orthopedics;  Laterality: Right;   KNEE ARTHROSCOPY Right    KNEE CLOSED REDUCTION Right 05/23/2015   Procedure: CLOSED MANIPULATION KNEE;  Surgeon: Dereck Leep, MD;  Location: [facility];  Service: Orthopedics;  Laterality: Right;   RIGHT/LEFT HEART CATH AND CORONARY ANGIOGRAPHY N/A 01/25/2021   Procedure: RIGHT/LEFT HEART CATH AND CORONARY ANGIOGRAPHY;  Surgeon: Nelva Bush, MD;  Location: [facility];  Service: Cardiovascular;  Laterality: N/A;   TONSILLECTOMY      Current Medications: Current Meds  Medication Sig   acetaminophen (TYLENOL) 650 MG CR tablet Take 1,300 mg by mouth at bedtime.   ALFALFA PO Take 1 tablet by mouth daily.   apixaban (ELIQUIS) 5 MG TABS tablet Take 1 tablet (5 mg total) by mouth 2 (two) times daily.   ascorbic Acid (VITAMIN C)  500 MG CPCR Take 500 mg by mouth daily.   B Complex-C (B-COMPLEX WITH VITAMIN C) tablet Take 1 tablet by mouth 2 (two) times daily.   BD PEN NEEDLE NANO U/F 32G X 4 MM MISC USE 4 TIMES DAILY (HUMALOG 3 TIMES DAILY AND LANTUS ONCE DAILY) OFFICE NOTIFIED 08/06/17   BLACK ELDERBERRY,BERRY-FLOWER, PO Take 15 mLs by mouth daily. Liquid   Calcium Carbonate (CALCIUM 600 PO) Take 600 mg by mouth daily.   carvedilol (COREG) 3.125 MG tablet Take 1 tablet (3.125 mg total) by mouth 2 (two) times daily.   cholecalciferol (VITAMIN D) 1000 UNITS tablet Take 1,000 Units by mouth daily. [place]   clobetasol ointment (TEMOVATE) [id number] % APPLY 1 APPLICATION TOPICALLY 2 TIMES DAILY AS NEEDED (BUG BITES).   Coenzyme Q10 (COQ10) 200 MG CAPS Take 200 mg by mouth daily.   Continuous Blood Gluc Sensor ([place]) MISC by Does not apply route.   Elastic Bandages & Supports (MEDICAL  COMPRESSION STOCKINGS) MISC 1 each by Does not apply route daily.   estradiol (ESTRACE VAGINAL) 0.1 MG/GM vaginal cream 1/2 gm once weekly using applicator, apply blueberry sized amount of cream using tip of finger to urethra twice weekly   Exenatide ER (BYDUREON BCISE) 2 MG/0.85ML AUIJ Inject 2 mg into the skin every Sunday.   fluticasone (FLONASE) 50 MCG/ACT nasal spray Place 2 sprays into both nostrils daily as needed for allergies or rhinitis.   furosemide (LASIX) 40 MG tablet Take 1 tablet (40 mg total) by mouth daily.   Glucosamine 500 MG TABS Take 500 mg by mouth daily.   HUMALOG KWIKPEN 100 UNIT/ML KiwkPen Inject 4-12 Units into the skin 3 (three) times daily before meals. If needed sliding scale   insulin glargine (LANTUS) 100 unit/mL SOPN Inject 22 Units into the skin at bedtime.   Krill Oil 1000 MG CAPS Take 1,000 mg by mouth daily.   Mag Aspart-Potassium Aspart (POTASSIUM & MAGNESIUM ASPARTAT PO) Take 1 tablet by mouth daily.   Multiple Vitamins-Minerals (PRESERVISION AREDS 2) CAPS Take 1 capsule by mouth 2 (two) times daily.   OVER THE COUNTER MEDICATION Place 1 application into both eyes See admin instructions. Blephadex eyelid foam, apply to eyelids once daily   OVER THE COUNTER MEDICATION Take 1 tablet by mouth See admin instructions. Nutriferon otc supplement take Take 1 tablet on Sun, Tues, Thurs, Sat. Take 1 tablet twice daily on Mon, Wed, and Fri   Probiotic Product (PROBIOTIC PO) Take 1 capsule by mouth at bedtime.   PROLIA 60 MG/ML SOSY injection Inject 60 mg into the skin every 6 (six) months.   Propylene Glycol (SYSTANE BALANCE OP) Place 1 drop into both eyes daily as needed (dry eyes).   rosuvastatin (CRESTOR) 10 MG tablet Take 1 tablet (10 mg total) by mouth daily.   Sodium Chloride-Sodium Bicarb (NETI POT SINUS WASH NA) Place 1 Dose into the nose at bedtime.   tiZANidine (ZANAFLEX) 2 MG tablet Take 1 tablet (2 mg total) by mouth at bedtime.   Vitamin D, Ergocalciferol,  (DRISDOL) 1.25 MG (50000 UT) CAPS capsule Take 50,000 Units by mouth every Saturday.   [DISCONTINUED] amiodarone (PACERONE) 200 MG tablet Take 1 tablet (200 mg total) by mouth 2 (two) times daily. Take for 2 weeks as per cardio & will f/u w/ Dr. Rockey Situ in 2 weeks   [DISCONTINUED] losartan (COZAAR) 25 MG tablet Take 0.5 tablets (12.5 mg total) by mouth daily.    Allergies:   Cyclobenzaprine, Cheese, Ciprofloxacin  hcl, Coconut oil, and Keflex [cephalexin]   Social History   Socioeconomic History   Marital status: Married    Spouse name: Emergency planning/management officer   Number of children: 1   Years of education: Not on file   Highest education level: Master's degree (e.g., MA, MS, MEng, MEd, MSW, MBA)  Occupational History   Occupation: Retired  Tobacco Use   Smoking status: Never   Smokeless tobacco: Never  Vaping Use   Vaping Use: Never used  Substance and Sexual Activity   Alcohol use: Not Currently    Alcohol/week: 1.0 standard drink    Types: 1 Glasses of wine per week   Drug use: No   Sexual activity: Yes    Partners: Male    Birth control/protection: Post-menopausal  Other Topics Concern   Not on file  Social History Narrative   She is married , only has one son and he was diagnosed with colon cancer at age 80.    Social Determinants of Health   Financial Resource Strain: Low Risk    Difficulty of Paying Living Expenses: Not hard at all  Food Insecurity: No Food Insecurity   Worried About Charity fundraiser in the Last Year: Never true   [place] in the Last Year: Never true  Transportation Needs: No Transportation Needs   Lack of Transportation (Medical): No   Lack of Transportation (Non-Medical): No  Physical Activity: Sufficiently Active   Days of Exercise per Week: 7 days   Minutes of Exercise per Session: 30 min  Stress: No Stress Concern Present   Feeling of Stress : Not at all  Social Connections: Socially Integrated   Frequency of Communication with Friends and  Family: More than three times a week   Frequency of Social Gatherings with Friends and Family: More than three times a week   Attends Religious Services: More than 4 times per year   Active Member of [employer] or Organizations: Yes   Attends Music therapist: More than 4 times per year   Marital Status: Married     Family History:  The patient's family history includes Breast cancer (age of onset: 98) in her maternal aunt; Cancer in her father and mother; Colon cancer in her son; Diabetes in her mother; Hypertension in her father and mother; Kidney disease in her mother. There is no history of Kidney cancer or Bladder Cancer.  ROS:   Review of Systems  Constitutional:  Positive for malaise/fatigue. Negative for chills, diaphoresis, fever and weight loss.  HENT:  Negative for congestion.   Eyes:  Negative for discharge and redness.  Respiratory:  Negative for cough, sputum production, shortness of breath and wheezing.   Cardiovascular:  Negative for chest pain, palpitations, orthopnea, claudication, leg swelling and PND.  Gastrointestinal:  Negative for abdominal pain, blood in stool, heartburn, melena, nausea and vomiting.  Musculoskeletal:  Negative for falls and myalgias.  Skin:  Negative for rash.  Neurological:  Negative for dizziness, tingling, tremors, sensory change, speech change, focal weakness, loss of consciousness and weakness.  Endo/Heme/Allergies:  Does not bruise/bleed easily.  Psychiatric/Behavioral:  Negative for substance abuse. The patient is not nervous/anxious.   All other systems reviewed and are negative.   EKGs/Labs/Other Studies Reviewed:    Studies reviewed were summarized above. The additional studies were reviewed today:  [facility] 01/25/2021: Conclusions: Mild to moderate, non-obstructive coronary artery disease, including 30% mid LAD disease, 50-60% mid/distal LCx stenosis, and 60% proximal/mid RCA lesion. Mildly to moderately  reduced left  ventricular systolic function with mid anterior hypo/akinesis; query atypical Takotsubo variant.  LVEF ~45%. Mildly elevated left heart filling pressures. Moderately elevated right heart filling and pulmonary artery pressures with significant pulmonary vascular resistance. Mildly reduced [place] cardiac output/index.   Recommendations: Continue IV diuresis. Advance goal-directed medical therapy for cardiomyopathy, as tolerated.  Patient may benefit from outpatient advanced heart failure consultation. Increase metoprolol for rate control of atrial fibrillation. Restart IV heparin in 2 hours after TR band removal.  Transition back to apixaban tomorrow AM if no evidence of bleeding/vascular injury. Secondary prevention of coronary artery disease. __________  2D echo 01/23/2021: 1. Left ventricular ejection fraction, by estimation, is 45 to 50%. The  left ventricle has mildly decreased function. The left ventricle  demonstrates regional wall motion abnormalities (see scoring  diagram/findings for description). There is mild left  ventricular hypertrophy. Left ventricular diastolic function could not be  evaluated. There is severe hypokinesis of the left ventricular, entire  anterior wall and anteroseptal wall.   2. Right ventricular systolic function is moderately reduced. The right  ventricular size is normal. Mildly increased right ventricular wall  thickness.   3. The mitral valve is degenerative. Trivial mitral valve regurgitation.   4. The aortic valve is tricuspid. Aortic valve regurgitation not well  assessed.  __________  2D echo 09/2020: 1. Left ventricular ejection fraction, by estimation, is 60 to 65%. The  left ventricle has normal function. The left ventricle has no regional  wall motion abnormalities. Left ventricular diastolic parameters are  indeterminate.   2. Right ventricular systolic function is normal. The right ventricular  size is normal. There is normal pulmonary  artery systolic pressure.   3. Left atrial size was mildly dilated.   4. The mitral valve is normal in structure. Mild mitral valve  regurgitation. __________   Renal artery ultrasound 08/2019: Summary:  Largest Aortic Diameter: 1.6 cm     Renal:     Right: Normal size right kidney. Normal right Resisitive Index.         Normal cortical thickness of right kidney. No evidence of         right renal artery stenosis. RRV flow present.  Left:  Cyst(s) noted. 1-59% stenosis of the left renal artery.         Normal size of left kidney. Normal left Resistive Index.         Normal cortical thickness of the left kidney. Cyst 1.62 cm x         1.71 cm.  Mesenteric:  Normal Celiac artery and Superior Mesenteric artery findings.  Patent IVC. __________   Carotid artery ultrasound 08/2015: 1 to 39% bilateral ICA stenosis __________   2D echo 01/2015: - Left ventricle: The cavity size was normal. Systolic function was    normal. The estimated ejection fraction was in the range of 60%    to 65%. Wall motion was normal; there were no regional wall    motion abnormalities. Left ventricular diastolic function    parameters were normal.  - Mitral valve: There was mild regurgitation.  - Left atrium: The atrium was mildly dilated.  - Right ventricle: Systolic function was normal.  - Pulmonary arteries: Systolic pressure was borderline elevated. PA    peak pressure: 37 mm Hg (S).   Impressions:   - Normal study. Rhythm is normal sinus.   EKG:  EKG is ordered today.  The EKG ordered today demonstrates NSR, 70 bpm, nonspecific lateral ST-T changes  Recent Labs: 01/09/2021: TSH 4.83 01/22/2021: B Natriuretic Peptide 237.3 01/23/2021: ALT 34 01/31/2021: Hemoglobin 13.9; Magnesium 2.0; Platelets 285 02/07/2021: BUN 14; Creatinine, Ser 0.94; Potassium 4.3; Sodium 132  Recent Lipid Panel    Component Value Date/Time   CHOL 130 07/12/2020 0952   CHOL 110 01/12/2016 0817   TRIG 86 07/12/2020 0952    HDL 75 07/12/2020 0952   HDL 59 01/12/2016 0817   CHOLHDL 1.7 07/12/2020 0952   LDLCALC 38 07/12/2020 0952    PHYSICAL EXAM:    VS:  BP (!) 170/80 (BP Location: Left Arm, Patient Position: Sitting, Cuff Size: Large)   Pulse 70   Ht '5\' 3"'$  (1.6 m)   Wt 184 lb (83.5 kg)   SpO2 97%   BMI 32.59 kg/m   BMI: Body mass index is 32.59 kg/m.  Physical Exam Constitutional:      Appearance: She is well-developed.  HENT:     Head: Normocephalic and atraumatic.  Eyes:     General:        Right eye: No discharge.        Left eye: No discharge.  Neck:     Vascular: No JVD.  Cardiovascular:     Rate and Rhythm: Normal rate and regular rhythm.     Pulses:          Posterior tibial pulses are 2+ on the right side and 2+ on the left side.     Heart sounds: Normal heart sounds, S1 normal and S2 normal. Heart sounds not distant. No midsystolic click and no opening snap. No murmur heard.   No friction rub.  Pulmonary:     Effort: Pulmonary effort is normal. No respiratory distress.     Breath sounds: Normal breath sounds. No decreased breath sounds, wheezing or rales.  Chest:     Chest wall: No tenderness.  Abdominal:     General: There is no distension.     Palpations: Abdomen is soft.     Tenderness: There is no abdominal tenderness.  Musculoskeletal:     Cervical back: Normal range of motion.     Right lower leg: Edema present.     Left lower leg: Edema present.     Comments: Trace bilateral pretibial edema  Skin:    General: Skin is warm and dry.     Nails: There is no clubbing.  Neurological:     Mental Status: She is alert and oriented to person, place, and time.  Psychiatric:        Speech: Speech normal.        Behavior: Behavior normal.        Thought Content: Thought content normal.        Judgment: Judgment normal.    Wt Readings from Last 3 Encounters:  02/10/21 184 lb (83.5 kg)  02/09/21 181 lb (82.1 kg)  02/02/21 184 lb (83.5 kg)     ASSESSMENT & PLAN:    Chronic combined systolic and diastolic CHF/nonischemic cardiomyopathy/pulmonary hypertension: She is doing well and appears euvolemic with NYHA class II symptoms.  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  Escalate GDMT with addition of carvedilol 3.125 mg twice daily (precautions discussed).  She will otherwise continue losartan 25 mg daily (difficulty in cutting tabs) along with Lasix 40 mg daily.  Recent BMP 3 days ago showed a stable renal function and potassium at goal.  In follow-up consider addition of MRA or SGLT2i, or transition from ARB to [place].  Obtain echo in approximately 6 weeks to evaluate for improvement in LV systolic function following optimization of GDMT.  Persistent A. fib: She is maintaining sinus rhythm following most recent DCCV.  Decrease amiodarone to 200 mg daily.  Add carvedilol 3.125 mg twice daily as outlined above, given her cardiomyopathy.  Given a CHA2DS2-VASc of at least 7 (CHF, HTN, age x2, DM, vascular disease, sex category) she remains on indefinite anticoagulation with Eliquis without any symptoms concerning for bleeding.  Recent hemoglobin stable.  Nonobstructive CAD: No symptoms of angina at this time.  Continue risk factor modification and medical therapy including apixaban in place of aspirin to minimize bleeding risk, newly added carvedilol as outlined above, losartan, and rosuvastatin.  No indication for further ischemic testing at this time.  HTN: Blood pressure initially elevated at triage at 170/80.  Home BPs have typically been in the Q000111Q systolic.  Recheck BP improved to 134/74.  BP was also well controlled at PCPs office.  Continue current medical therapy as outlined above.  HLD: LDL 38 in 07/2020.  She remains on rosuvastatin.  Disposition: F/u with Dr. Rockey Situ or an APP in 8 weeks.   Medication Adjustments/Labs and Tests Ordered: Current medicines are reviewed at length with the patient today.  Concerns regarding medicines  are outlined above. Medication changes, Labs and Tests ordered today are summarized above and listed in the Patient Instructions accessible in Encounters.   Signed, [name], [name] 02/10/2021 3:59 PM     [facility] [address] [place], [place] [address] [phone]

## 2021-02-06 ENCOUNTER — Ambulatory Visit: Payer: Medicare Other | Admitting: Physician Assistant

## 2021-02-07 ENCOUNTER — Telehealth: Payer: Self-pay

## 2021-02-07 ENCOUNTER — Other Ambulatory Visit
Admission: RE | Admit: 2021-02-07 | Discharge: 2021-02-07 | Disposition: A | Discharge status: Home / Self Care | Payer: Medicare Other | Source: Ambulatory Visit | Attending: Cardiovascular Disease | Admitting: Cardiovascular Disease

## 2021-02-07 ENCOUNTER — Other Ambulatory Visit: Payer: Self-pay | Admitting: Home Health

## 2021-02-07 DIAGNOSIS — I110 Hypertensive heart disease with heart failure: Secondary | ICD-10-CM | POA: Diagnosis not present

## 2021-02-07 DIAGNOSIS — Z85820 Personal history of malignant melanoma of skin: Secondary | ICD-10-CM | POA: Diagnosis not present

## 2021-02-07 DIAGNOSIS — X32XXXA Exposure to sunlight, initial encounter: Secondary | ICD-10-CM | POA: Diagnosis not present

## 2021-02-07 DIAGNOSIS — Z5181 Encounter for therapeutic drug level monitoring: Secondary | ICD-10-CM | POA: Diagnosis not present

## 2021-02-07 DIAGNOSIS — I5043 Acute on chronic combined systolic (congestive) and diastolic (congestive) heart failure: Secondary | ICD-10-CM | POA: Diagnosis not present

## 2021-02-07 DIAGNOSIS — Z85828 Personal history of other malignant neoplasm of skin: Secondary | ICD-10-CM | POA: Diagnosis not present

## 2021-02-07 DIAGNOSIS — I480 Paroxysmal atrial fibrillation: Secondary | ICD-10-CM | POA: Diagnosis not present

## 2021-02-07 DIAGNOSIS — D2261 Melanocytic nevi of right upper limb, including shoulder: Secondary | ICD-10-CM | POA: Diagnosis not present

## 2021-02-07 DIAGNOSIS — I701 Atherosclerosis of renal artery: Secondary | ICD-10-CM | POA: Diagnosis not present

## 2021-02-07 DIAGNOSIS — Z5189 Encounter for other specified aftercare: Secondary | ICD-10-CM

## 2021-02-07 DIAGNOSIS — L570 Actinic keratosis: Secondary | ICD-10-CM | POA: Diagnosis not present

## 2021-02-07 DIAGNOSIS — D2271 Melanocytic nevi of right lower limb, including hip: Secondary | ICD-10-CM | POA: Diagnosis not present

## 2021-02-07 DIAGNOSIS — J9601 Acute respiratory failure with hypoxia: Secondary | ICD-10-CM | POA: Diagnosis not present

## 2021-02-07 DIAGNOSIS — I2510 Atherosclerotic heart disease of native coronary artery without angina pectoris: Secondary | ICD-10-CM | POA: Diagnosis not present

## 2021-02-07 DIAGNOSIS — D2262 Melanocytic nevi of left upper limb, including shoulder: Secondary | ICD-10-CM | POA: Diagnosis not present

## 2021-02-07 LAB — BASIC METABOLIC PANEL
Anion gap: 9 (ref 5–15)
BUN: 14 mg/dL (ref 8–23)
CO2: 30 mmol/L (ref 22–32)
Calcium: 8.9 mg/dL (ref 8.9–10.3)
Chloride: 93 mmol/L — ABNORMAL LOW (ref 98–111)
Creatinine, Ser: 0.94 mg/dL (ref 0.44–1.00)
GFR, Estimated: >60 mL/min (ref >60)
Glucose, Bld: 139 mg/dL — ABNORMAL HIGH (ref 70–99)
Potassium: 4.3 mmol/L (ref 3.5–5.1)
Sodium: 132 mmol/L — ABNORMAL LOW (ref 135–145)

## 2021-02-07 NOTE — Progress Notes (Addendum)
    Chronic Care Management Pharmacy Assistant   Name: ARIEANNA CARRITHERS  MRN: [id number] DOB: 10/22/1940  02/08/21 Patient missed her appointment and when she called back it was too late for Alex to see her same day. I reached out to the patient to get her rescheduled. Per Junius Argyle, CPP he will see patient in office face to face tomorrow as she also has a visit with Dr. Ruthine Dose at 1120 and he will see her at 1230 pm.  Patient called to  be reminded of her appointment with Junius Argyle, CPP on 02/08/2021 @ 1400 via telephone  No answer, left message of appointment date, time and type of appointment (either telephone or in person). Left message to have all medications, supplements, blood pressure and/or blood sugar logs available during appointment and to return call if need to reschedule.   Star Rating Drug: Rosuvastatin 10 mg last filled on 01/16/2021 for a 90-Day supply with [facility] Losartan 25 mg last filled on 01/31/2021 for a 30-Day supply with [facility]  Any gaps in medications fill history? NO   Care Gaps: COVID-19 Vaccine INFLUENZA VACCINE

## 2021-02-08 ENCOUNTER — Telehealth: Payer: Self-pay

## 2021-02-08 DIAGNOSIS — I480 Paroxysmal atrial fibrillation: Secondary | ICD-10-CM | POA: Diagnosis not present

## 2021-02-08 DIAGNOSIS — I5043 Acute on chronic combined systolic (congestive) and diastolic (congestive) heart failure: Secondary | ICD-10-CM | POA: Diagnosis not present

## 2021-02-08 DIAGNOSIS — I2510 Atherosclerotic heart disease of native coronary artery without angina pectoris: Secondary | ICD-10-CM | POA: Diagnosis not present

## 2021-02-08 DIAGNOSIS — J9601 Acute respiratory failure with hypoxia: Secondary | ICD-10-CM | POA: Diagnosis not present

## 2021-02-08 DIAGNOSIS — I110 Hypertensive heart disease with heart failure: Secondary | ICD-10-CM | POA: Diagnosis not present

## 2021-02-08 DIAGNOSIS — I701 Atherosclerosis of renal artery: Secondary | ICD-10-CM | POA: Diagnosis not present

## 2021-02-08 NOTE — Progress Notes (Deleted)
Chronic Care Management Pharmacy Note  02/08/2021 Name:  Bonnie Rowland MRN:  [id number] DOB:  1941-04-13  Summary: ***  Recommendations/Changes made from today's visit: ***  Plan: ***   Subjective: Bonnie Rowland is an 80 y.o. year old female who is a primary patient of Steele Sizer, MD.  The CCM team was consulted for assistance with disease management and care coordination needs.    Engaged with patient by telephone for initial visit in response to provider referral for pharmacy case management and/or care coordination services.   Consent to Services:  The patient was given the following information about Chronic Care Management services today, agreed to services, and gave verbal consent: 1. CCM service includes personalized support from designated clinical staff supervised by the primary care provider, including individualized plan of care and coordination with other care providers 2. 24/7 contact phone numbers for assistance for urgent and routine care needs. 3. Service will only be billed when office clinical staff spend 20 minutes or more in a month to coordinate care. 4. Only one practitioner may furnish and bill the service in a calendar month. 5.The patient may stop CCM services at any time (effective at the end of the month) by phone call to the office staff. 6. The patient will be responsible for cost sharing (co-pay) of up to 20% of the service fee (after annual deductible is met). Patient agreed to services and consent obtained.  Patient Care Team: Steele Sizer, MD as PCP - General (Family Medicine) Rockey Situ Kathlene November, MD as PCP - Cardiology (Cardiology) Minna Merritts, MD as Consulting Physician (Cardiology) Dasher, Rayvon Char, MD as Consulting Physician (Dermatology) Estill Cotta, MD as Consulting Physician (Ophthalmology) Lonia Farber, MD as Consulting Physician (Internal Medicine) Beverly Gust, MD as Consulting Physician  (Otolaryngology) Iris Pert, Clarence as Referring Physician (Chiropractic Medicine) Germaine Pomfret, [facility] as Pharmacist (Pharmacist)  Recent office visits: 01/09/2021 Steele Sizer, MD (PCP Office Visit) for Follow-up- No Medication Changes Noted   07/12/2020 Steele Sizer, MD (PCP Office Visit) for Follow-up- No Medication Changes Noted  Recent consult visits: 01/09/2021 Lanae Crumbly, DPM (Podiatry) for Follow-up- No Medication Changes Noted   12/22/2020 [name], [name] (Cardiology) For Persistent AFIB-  Start Amiodarone 400 mg twice daily for 1 week, followed by 200 mg twice daily X 1 week, followed by 20 mg daily. Stopped Insulin Glargine 24 units (Patient reported not taking) instead she's taking 22 units Daily; Increased Rosuvastatin 10 mg to Daily- Patient instructed to return in 3-4 weeks.   12/15/2020 Ida Rogue, MD (Cardiology) (Telephone Call) Increased Metoprolol Succinate to 50 mg twice daily   11/04/2020 Ida Rogue, MD (Cardiology) for 1 month follow-up- Increase of Metoprolol Succinate to 75 mg daily, Started Lasix 20 mg prn for swelling; Follow-up in 3 months   10/17/2020 Armandina Gemma, DPM (Podiatry) For Nail Problem) - No Medication Changes Noted   09/26/2020 Ida Rogue, MD (Cardiology) for follow-up- Increase Metoprolol Succinate to 50 mg Daily; Start Eliquis 5 mg twice daily- Echocardiogram Ordered- Follow-up in 1 month   09/13/2020 [name], MD (Endocrinology) For Diabetes Follow-up- No Medication changes noted- Patient instructed to return in 6 months   06/10/2020 Zara Council, [name] (Urology) for Urge incontinence- Doxycycline 100 mg discontinued for completed course. Started Fluconazole 150 mg; Bladder Scan order placed; Patient instructed to follow-up in 1 year.   06/01/2020 Velora Heckler (Orthotics) for Diabetic Shoes and custom inserts   04/18/2021 Lurlean Leyden, DO (Internal Medicine) for Mass-  No Medication Changes Noted    04/11/2021 Lurlean Leyden, DO (Internal Medicine) for Wound Check- Started Doxycycline 100 mg 1 tablet twice daily X 10 Days.  Hospital visits:  Admitted to the hospital on 01/23/2021 due to HF exacerbation. Discharge date was 01/31/2021 Discharged from [facility].    Admitted to the hospital on 11/30/2020 due to Cardioversion. Discharge date was 11/30/2020 Discharged from [place]?Medications Started at [facility] Discharge:?? -started - None ID   Medication Changes at Hospital Discharge: -Changed None ID   Medications Discontinued at Hospital Discharge: -Stopped None ID   Medications that remain the same after Hospital Discharge:?? -All other medications will remain the same.   Objective:  Lab Results  Component Value Date   CREATININE 0.94 02/07/2021   BUN 14 02/07/2021   GFRNONAA >60 02/07/2021   GFRAA 102 04/11/2018   NA 132 (L) 02/07/2021   K 4.3 02/07/2021   CALCIUM 8.9 02/07/2021   CO2 30 02/07/2021   GLUCOSE 139 (H) 02/07/2021    Lab Results  Component Value Date/Time   HGBA1C 6.5 (H) 01/09/2021 08:36 AM   HGBA1C 6.5 09/13/2020 12:00 AM   HGBA1C 6.7 08/21/2019 12:00 AM   MICROALBUR Negative 08/21/2019 12:00 AM   MICROALBUR 0 02/18/2017 09:01 AM   MICROALBUR 50 04/23/2016 10:37 AM    Last diabetic Eye exam:  Lab Results  Component Value Date/Time   HMDIABEYEEXA Retinopathy (A) 01/17/2021 12:00 AM    Last diabetic Foot exam: No results found for: HMDIABFOOTEX   Lab Results  Component Value Date   CHOL 130 07/12/2020   HDL 75 07/12/2020   LDLCALC 38 07/12/2020   TRIG 86 07/12/2020   CHOLHDL 1.7 07/12/2020    Hepatic Function Latest Ref Rng & Units 01/23/2021 01/09/2021 05/13/2020  Total Protein 6.5 - 8.1 g/dL 7.1 6.2 6.8  Albumin 3.5 - 5.0 g/dL 4.0 - 4.1  AST 15 - 41 U/L 42(H) 28 26  ALT 0 - 44 U/L 34 26 22  Alk Phosphatase 38 - 126 U/L 81 - 53  Total Bilirubin 0.3 -  1.2 mg/dL 1.6(H) 1.0 1.3(H)    Lab Results  Component Value Date/Time   TSH 4.83 (H) 01/09/2021 08:36 AM   TSH 2.82 04/11/2018 10:32 AM    CBC Latest Ref Rng & Units 01/31/2021 01/30/2021 01/27/2021  WBC 4.0 - 10.5 K/uL 8.4 9.4 10.0  Hemoglobin 12.0 - 15.0 g/dL 13.9 14.2 13.9  Hematocrit 36.0 - 46.0 % 41.1 41.1 39.5  Platelets 150 - 400 K/uL 285 275 202    Lab Results  Component Value Date/Time   VD25OH 28 (L) 08/22/2017 09:35 AM    Clinical ASCVD: {YES/NO:21197} The 10-year ASCVD risk score Mikey Bussing DC Jr., et al., 2013) is: 60.3%   Values used to calculate the score:     Age: 17 years     Sex: Female     Is Non-Hispanic African American: No     Diabetic: Yes     Tobacco smoker: No     Systolic Blood Pressure: 431 mmHg     Is BP treated: Yes     HDL Cholesterol: 75 mg/dL     Total Cholesterol: 130 mg/dL    Depression screen [facility] 2/9 02/02/2021 01/09/2021 07/12/2020  Decreased Interest 0 0 0  Down, Depressed, Hopeless 0 0 0  PHQ - 2 Score 0 0 0  Altered sleeping - - -  Tired, decreased energy - - -  Change in appetite - - -  Feeling bad or failure about yourself  - - -  Trouble concentrating - - -  Moving slowly or fidgety/restless - - -  Suicidal thoughts - - -  PHQ-9 Score - - -  Difficult doing work/chores - - -  Some recent data might be hidden     -Last DEXA Scan: 08/13/17   T-Score femoral neck: -3.3  T-Score total hip: -2.2  T-Score lumbar spine: NA  T-Score forearm radius: -1.6  10-year probability of major osteoporotic fracture: NA  10-year probability of hip fracture: NA  Social History   Tobacco Use  Smoking Status Never  Smokeless Tobacco Never   BP Readings from Last 3 Encounters:  02/02/21 (!) 149/46  01/31/21 118/84  01/20/21 (!) 143/65   Pulse Readings from Last 3 Encounters:  02/02/21 67  01/31/21 (!) 54  01/20/21 (!) 57   Wt Readings from Last 3 Encounters:  02/02/21 184 lb (83.5 kg)  01/31/21 182 lb 14.4 oz (83 kg)  01/20/21 197 lb 1.5  oz (89.4 kg)   BMI Readings from Last 3 Encounters:  02/02/21 32.59 kg/m  01/31/21 32.40 kg/m  01/20/21 34.91 kg/m    Assessment/Interventions: Review of patient past medical history, allergies, medications, health status, including review of consultants reports, laboratory and other test data, was performed as part of comprehensive evaluation and provision of chronic care management services.   SDOH:  (Social Determinants of Health) assessments and interventions performed: {yes/no:20286}  SDOH Screenings   Alcohol Screen: Low Risk    Last Alcohol Screening Score (AUDIT): 2  Depression (PHQ2-9): Low Risk    PHQ-2 Score: 0  Financial Resource Strain: Low Risk    Difficulty of Paying Living Expenses: Not very hard  Food Insecurity: No Food Insecurity   Worried About Charity fundraiser in the Last Year: Never true   Ran Out of Food in the Last Year: Never true  Housing: Low Risk    Last Housing Risk Score: 0  Physical Activity: Sufficiently Active   Days of Exercise per Week: 7 days   Minutes of Exercise per Session: 30 min  Social Connections: Engineer, building services of Communication with Friends and Family: More than three times a week   Frequency of Social Gatherings with Friends and Family: More than three times a week   Attends Religious Services: More than 4 times per year   Active Member of [employer] or Organizations: Yes   Attends Music therapist: More than 4 times per year   Marital Status: Married  Stress: No Stress Concern Present   Feeling of Stress : Not at all  Tobacco Use: Low Risk    Smoking Tobacco Use: Never   Smokeless Tobacco Use: Never  Transportation Needs: No Transportation Needs   Lack of Transportation (Medical): No   Lack of Transportation (Non-Medical): No    CCM Care Plan  Allergies  Allergen Reactions   Cyclobenzaprine Hypertension   Cheese Other (See Comments)    bloating   Ciprofloxacin Hcl Other (See Comments)     Muscle pain   Coconut Oil     Upset stomach   Keflex [Cephalexin] Other (See Comments)    Patient prefers not to take this medication due to the side effects, caused tendon issues    Medications Reviewed Today     Reviewed by Alisa Graff, FNP (Family Nurse Practitioner) on 02/02/21 at [place] [facility] Status: <None>   Medication  Order Taking? Sig Documenting Provider Last Dose Status Informant  acetaminophen (TYLENOL) 650 MG CR tablet [id number] Yes Take 1,300 mg by mouth at bedtime. [provider] Taking Active Self  ALFALFA PO [id number] Yes Take 1 tablet by mouth daily. [provider] Taking Active Self  amiodarone (PACERONE) 200 MG tablet [id number] Yes Take 1 tablet (200 mg total) by mouth 2 (two) times daily. Take for 2 weeks as per cardio & will f/u w/ Dr. Rockey Situ in 2 weeks Wyvonnia Dusky, MD Taking Active   apixaban (ELIQUIS) 5 MG TABS tablet [id number] Yes Take 1 tablet (5 mg total) by mouth 2 (two) times daily. Minna Merritts, MD Taking Active Self  ascorbic Acid (VITAMIN C) 500 MG CPCR [id number] Yes Take 500 mg by mouth daily. [provider] Taking Active Self  B Complex-C (B-COMPLEX WITH VITAMIN C) tablet [id number] Yes Take 1 tablet by mouth 2 (two) times daily. [provider] Taking Active Self  BD PEN NEEDLE NANO U/F 32G X 4 MM MISC [id number] Yes USE 4 TIMES DAILY (HUMALOG 3 TIMES DAILY AND LANTUS ONCE DAILY) OFFICE NOTIFIED 08/06/17 Steele Sizer, MD Taking Active Self  BLACK ELDERBERRY,BERRY-FLOWER, PO [id number] Yes Take 15 mLs by mouth daily. Liquid [provider] Taking Active Self  Calcium Carbonate (CALCIUM 600 PO) [id number] Yes Take 600 mg by mouth daily. [provider] Taking Active Self  cholecalciferol (VITAMIN D) 1000 UNITS tablet [id number] Yes Take 1,000 Units by mouth daily. [place] [provider] Taking Active Self  clobetasol ointment (TEMOVATE) 0.05 % [id number] Yes APPLY 1 APPLICATION  TOPICALLY 2 TIMES DAILY AS NEEDED (BUG BITES).  Patient taking differently: Apply 1 application topically daily as needed (Bug bites).   Steele Sizer, MD Taking Active   Coenzyme Q10 (COQ10) 200 MG CAPS [id number] Yes Take 200 mg by mouth daily. [provider] Taking Active Self  Continuous Blood Gluc Sensor ([place]) [place] [id number] Yes by Does not apply route. [provider] Taking Active Self  Elastic Bandages & Supports ([place]) [place] [id number] Yes 1 each by Does not apply route daily. Steele Sizer, MD Taking Active Self  estradiol (ESTRACE VAGINAL) 0.1 MG/GM vaginal cream [id number] Yes 1/2 gm once weekly using applicator, apply blueberry sized amount of cream using tip of finger to urethra twice weekly  Patient taking differently: Place 1 Applicatorful vaginally See admin instructions. 1/2 gm once weekly using applicator, apply blueberry sized amount of cream using tip of finger to urethra three weekly   McGowan, Hunt Oris, [name] Taking Active Self  Exenatide ER (BYDUREON BCISE) 2 MG/0.85ML AUIJ [id number] Yes Inject 2 mg into the skin every Sunday. [provider] Taking Active Self  fluticasone (FLONASE) 50 MCG/ACT nasal spray [id number] Yes PLACE 2 SPRAYS INTO BOTH NOSTRILS DAILY.  Patient taking differently: Place 2 sprays into both nostrils daily as needed for rhinitis.   Steele Sizer, MD Taking Active Self  furosemide (LASIX) 40 MG tablet [id number] Yes Take 1 tablet (40 mg total) by mouth daily. Wyvonnia Dusky, MD Taking Active   GARLIC 2423 PO [id number] Yes Take 1,500 mg by mouth daily. [provider] Taking Active Self  Glucosamine 500 MG TABS [id number] Yes Take 500 mg by mouth daily. [provider] Taking Active Self  HUMALOG KWIKPEN 100 UNIT/ML KiwkPen [id number] No Inject 4-12 Units into the skin 3 (three) times daily before meals. If needed sliding scale  Patient not taking: Reported  on 02/02/2021   [provider] Not Taking Active Self  insulin glargine (LANTUS) 100 unit/mL SOPN [id number] Yes Inject 22 Units into the skin at bedtime. [provider] Taking Active Self  Krill Oil 1000 MG CAPS [id number] Yes Take 1,000 mg by mouth daily. [provider] Taking Active Self  losartan (COZAAR) 25 MG tablet [id number] Yes Take 0.5 tablets (12.5 mg total) by mouth daily. Wyvonnia Dusky, MD Taking Active   Mag Aspart-Potassium Aspart (POTASSIUM & MAGNESIUM ASPARTAT PO) [id number] Yes Take 1 tablet by mouth daily. [provider] Taking Active Self   Patient not taking:   Discontinued 01/23/21 1515 Multiple Vitamins-Minerals (PRESERVISION AREDS 2) CAPS [id number] Yes Take 1 capsule by mouth 2 (two) times daily. [provider] Taking Active Self  OVER THE [place] [id number] Yes Place 1 application into both eyes See admin instructions. Blephadex eyelid foam, apply to eyelids once daily [provider] Taking Active Self  OVER THE COUNTER MEDICATION [id number] Yes Take 1 tablet by mouth daily. Vita-Lea Gold otc supplement With out vitamin K ([place] products) [provider] Taking Active Self  OVER THE COUNTER MEDICATION [id number] Yes Take 1 tablet by mouth See admin instructions. Nutriferon otc supplement take Take 1 tablet on Sun, Tues, Thurs, Sat. Take 1 tablet twice daily on Mon, Wed, and Fri [provider] Taking Active Self  Probiotic Product (PROBIOTIC PO) [id number] Yes Take 1 capsule by mouth at bedtime. [provider] Taking Active Self  PROLIA 60 MG/ML SOSY injection [id number] Yes Inject 60 mg into the skin every 6 (six) months. [provider] Taking Active Self  Propylene Glycol (SYSTANE BALANCE OP) [id number] Yes Place 1 drop into both eyes daily as needed (dry eyes). [provider] Taking Active Self  rosuvastatin (CRESTOR) 10 MG tablet [id number] Yes Take 1 tablet (10  mg total) by mouth daily.  Patient taking differently: Take 10 mg by mouth at bedtime.   Minna Merritts, MD Taking Active Self  Sodium Chloride-Sodium Bicarb (NETI POT SINUS [place] NA) [id number] Yes Place 1 Dose into the nose at bedtime. [provider] Taking Active Self  tiZANidine (ZANAFLEX) 2 MG tablet [id number] Yes Take 1 tablet (2 mg total) by mouth at bedtime. Steele Sizer, MD Taking Active   Vitamin D, Ergocalciferol, (DRISDOL) 1.25 MG (50000 UT) CAPS capsule [id number] Yes Take 50,000 Units by mouth every Saturday. [provider] Taking Active Self  Med List Note Lona Millard, RN 01/13/15 1037): No UDS needed No meds prescribed by [facility]            Patient Active Problem List   Diagnosis Date Noted   Persistent atrial fibrillation [facility])    Hypertensive urgency    Acute on chronic combined systolic and diastolic CHF (congestive heart failure) (HCC)    Elevated troponin    AF (paroxysmal atrial fibrillation) ([place]) 01/23/2021   Type 2 diabetes mellitus with hyperlipidemia ([place]) 01/23/2021   Hypertensive emergency 01/23/2021   Acute respiratory failure with hypoxia ([place]) 01/23/2021   Clavi 04/07/2020   Acute left-sided low back pain without sciatica 11/25/2019   Trochanteric bursitis of left hip 11/25/2019   Plantar fasciitis, bilateral 05/21/2019   Age-related osteoporosis without current pathological fracture 11/05/2017   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([place]) 07/09/2017   Atherosclerosis of abdominal aorta ([place]) 07/09/2017   Bilateral carotid bruits 08/24/2015   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   Atrial flutter ([place]) 02/22/2015   Chronic pain 01/10/2015   Type 2  diabetes, uncontrolled, 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 (HCC) 01/10/2015   Fatigue 01/10/2015   History of melanoma excision 01/10/2015   Morbid obesity ([place]) 01/10/2015   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   Spinal stenosis, lumbar region, with  neurogenic claudication 12/06/2014   DDD (degenerative disc disease), lumbar 11/14/2014   Greater trochanteric bursitis of both hips 11/14/2014   Piriformis syndrome 11/14/2014   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   Hyperlipemia 11/07/2009   Hypertension, benign 11/07/2009   Atherosclerosis of renal artery ([place]) 11/07/2009   Hyperlipidemia due to type 2 diabetes mellitus ([place]) 11/07/2009   Perennial allergic rhinitis 11/27/2007   Lumbosacral neuritis 01/17/2007    Immunization History  Administered Date(s) Administered   Fluad Quad(high Dose 65+) 03/19/2019, 04/20/2020   Influenza, High Dose Seasonal PF 03/10/2015, 03/02/2016, 03/12/2017, 04/07/2018   Moderna Sars-Covid-2 Vaccination 07/14/2019, 08/11/2019, 05/17/2020   Pneumococcal Conjugate-13 07/13/2013   Pneumococcal Polysaccharide-23 03/31/2010   Tdap 03/07/2012   Zoster Recombinat (Shingrix) 02/02/2017, 04/05/2017   Zoster, Live 06/01/2010    Conditions to be addressed/monitored:  Hypertension, Hyperlipidemia, Diabetes, Atrial Fibrillation, Heart Failure, Coronary Artery Disease, Osteoporosis, Allergic Rhinitis, and Chronic Pain  There are no care plans that you recently modified to display for this patient.    Medication Assistance: {MEDASSISTANCEINFO:25044}  Compliance/Adherence/Medication fill history: Care Gaps: COVID-19 Vaccine Influenza vaccine   Star-Rating Drugs: Rosuvastatin 10 mg last filled on 01/16/2021 for a 90-Day supply with [facility] Losartan 25 mg last filled on 01/31/2021 for a 30-Day supply with [facility]  Patient's preferred pharmacy is:  [id number] [place]Phone: [phone]: [phone] [place]Phone: [phone]: [phone] Uses pill box? {Yes or If no, why not?:20788} Pt  endorses ***% compliance  We discussed: {Pharmacy options:24294} Patient decided to: {US Pharmacy Plan:23885}  Care Plan and Follow Up Patient Decision:  {FOLLOWUP:24991}  Plan: {CM FOLLOW UP PZJQD:64383} ***  Current Barriers:  {pharmacybarriers:24917}  Pharmacist Clinical Goal(s):  Patient will {PHARMACYGOALCHOICES:24921} through collaboration with PharmD and provider.   Interventions: 1:1 collaboration with SSteele Sizer MD regarding development and update of comprehensive plan of care as evidenced by provider attestation and co-signature Inter-disciplinary care team collaboration (see longitudinal plan of care) Comprehensive medication review performed; medication list updated in electronic medical record  Diabetes (A1c goal <8%) -{US controlled/uncontrolled:25276} -Managed by Dr. OHonor Junes -Current medications: Byduren BCISE 2 mg weekly Humalog 4-12 units three times daily  Lantus 22 units nightly  -Medications previously tried: ***  -Current home glucose readings fasting glucose: *** post prandial glucose: *** -{ACTIONS;DENIES/REPORTS:21021675} hypoglycemic/hyperglycemic symptoms -Current meal patterns:  breakfast: ***  lunch: ***  dinner: *** snacks: *** drinks: *** -Current exercise: *** -Educated on {CCM DM COUNSELING:25123} -Counseled to check feet daily and get yearly eye exams -{CCMPHARMDINTERVENTION:25122}  Heart Failure (Goal: manage symptoms and prevent exacerbations) -{US controlled/uncontrolled:25276} -Last ejection fraction: 45-50% (Date: 01/23/21) -HF type: Combined Systolic and Diastolic -NYHA Class: II (slight limitation of activity) -AHA HF Stage: C (Heart disease and symptoms present) -Current treatment: Furosemide 40 mg daily  Losartan 25 mg 1/2 tablet daily  -Medications previously tried: Metoprolol (Bradycardia), irbesartan, HCTZ, Telmisartan -Current home BP/HR readings: *** -Current dietary habits: *** -Current exercise habits:  *** -Educated on {CCM HF Counseling:25125} -{CCMPHARMDINTERVENTION:25122}  Atrial Fibrillation (Goal: prevent stroke and major bleeding) -{US controlled/uncontrolled:25276} -CHADSVASC: *** -Current treatment: Rate  control: Amiodarone 200 mg twice daily Anticoagulation: Eliquis 5 mg twice daily  -Medications previously tried: Flecainide,  -Home BP and HR readings: ***  -Counseled on {CCMAFIBCOUNSELING:25120} -{CCMPHARMDINTERVENTION:25122}  Hyperlipidemia: (LDL goal < 70) -Controlled -Current treatment: Rosuvastatin 10 mg daily  -Medications previously tried: ***  -Current dietary patterns: *** -Current exercise habits: *** -Educated on {CCM HLD Counseling:25126} -{CCMPHARMDINTERVENTION:25122}  Osteoporosis / Osteopenia (Goal ***) -{US controlled/uncontrolled:25276} -Patient is a candidate for pharmacologic treatment due to T-Score < -2.5 in femoral neck -Current treatment  Prolia 60 mg into the skin every 6 months  -Medications previously tried: ***  -{Osteoporosis Counseling:23892} -{CCMPHARMDINTERVENTION:25122}   *** (Goal: ***) -{US controlled/uncontrolled:25276} -Current treatment  *** -Medications previously tried: ***  -{CCMPHARMDINTERVENTION:25122}   Patient Goals/Self-Care Activities Patient will:  - {pharmacypatientgoals:24919}  Follow Up Plan: {CM FOLLOW UP FRHZ:12508}

## 2021-02-09 ENCOUNTER — Other Ambulatory Visit: Payer: Self-pay

## 2021-02-09 ENCOUNTER — Ambulatory Visit: Payer: Medicare Other

## 2021-02-09 ENCOUNTER — Ambulatory Visit (INDEPENDENT_AMBULATORY_CARE_PROVIDER_SITE_OTHER): Payer: Medicare Other | Admitting: Family Medicine

## 2021-02-09 ENCOUNTER — Encounter: Payer: Self-pay | Admitting: Family Medicine

## 2021-02-09 VITALS — BP 138/76 | HR 86 | Temp 98.0°F | Resp 16 | Ht 63.0 in | Wt 181.0 lb

## 2021-02-09 DIAGNOSIS — E1159 Type 2 diabetes mellitus with other circulatory complications: Secondary | ICD-10-CM

## 2021-02-09 DIAGNOSIS — M810 Age-related osteoporosis without current pathological fracture: Secondary | ICD-10-CM

## 2021-02-09 DIAGNOSIS — I5042 Chronic combined systolic (congestive) and diastolic (congestive) heart failure: Secondary | ICD-10-CM

## 2021-02-09 DIAGNOSIS — E113299 Type 2 diabetes mellitus with mild nonproliferative diabetic retinopathy without macular edema, unspecified eye: Secondary | ICD-10-CM

## 2021-02-09 DIAGNOSIS — Z9889 Other specified postprocedural states: Secondary | ICD-10-CM

## 2021-02-09 DIAGNOSIS — I480 Paroxysmal atrial fibrillation: Secondary | ICD-10-CM

## 2021-02-09 DIAGNOSIS — Z09 Encounter for follow-up examination after completed treatment for conditions other than malignant neoplasm: Secondary | ICD-10-CM | POA: Diagnosis not present

## 2021-02-09 DIAGNOSIS — E1169 Type 2 diabetes mellitus with other specified complication: Secondary | ICD-10-CM

## 2021-02-09 DIAGNOSIS — IMO0002 Reserved for concepts with insufficient information to code with codable children: Secondary | ICD-10-CM

## 2021-02-09 NOTE — Progress Notes (Signed)
Chronic Care Management Pharmacy Note  02/10/2021 Name:  ANALLELI GIERKE MRN:  [id number] DOB:  1941/02/19  Summary: Patient presents for Initial CCM Consult. She is in good spirits today and reports she is happy her heart has been in better health since her hospitalization. 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 She reports she has noticed some worsening hair loss recently. She has cardiology follow-up soon.  Recommendations/Changes made from today's visit: -Recommend decreasing amiodarone to 200 mg daily  -Recommend rechecking TSH, T4 -STOP vitamin D3 supplement while taking ergocalciferol  Plan: CPP follow-up in 3 months    Subjective: TOLUWANIMI RADEBAUGH is an 80 y.o. year old female who is a primary patient of Steele Sizer, MD.  The CCM team was consulted for assistance with disease management and care coordination needs.    Engaged with patient by telephone for initial visit in response to provider referral for pharmacy case management and/or care coordination services.   Consent to Services:  The patient was given the following information about Chronic Care Management services today, agreed to services, and gave verbal consent: 1. CCM service includes personalized support from designated clinical staff supervised by the primary care provider, including individualized plan of care and coordination with other care providers 2. 24/7 contact phone numbers for assistance for urgent and routine care needs. 3. Service will only be billed when office clinical staff spend 20 minutes or more in a month to coordinate care. 4. Only one practitioner may furnish and bill the service in a calendar month. 5.The patient may stop CCM services at any time (effective at the end of the month) by phone call to the office staff. 6. The patient will be responsible for cost sharing (co-pay) of up to 20% of the service fee (after annual deductible  is met). Patient agreed to services and consent obtained.  Patient Care Team: Steele Sizer, MD as PCP - General (Family Medicine) Rockey Situ Kathlene November, MD as PCP - Cardiology (Cardiology) Minna Merritts, MD as Consulting Physician (Cardiology) Dasher, Rayvon Char, MD as Consulting Physician (Dermatology) Estill Cotta, MD as Consulting Physician (Ophthalmology) Lonia Farber, MD as Consulting Physician (Internal Medicine) Beverly Gust, MD as Consulting Physician (Otolaryngology) Iris Pert, [place] as Referring Physician (Chiropractic Medicine) Germaine Pomfret, [facility] as Pharmacist (Pharmacist)  Recent office visits: 01/09/2021 Steele Sizer, MD (PCP Office Visit) for Follow-up- No Medication Changes Noted   07/12/2020 Steele Sizer, MD (PCP Office Visit) for Follow-up- No Medication Changes Noted  Recent consult visits: 01/09/2021 Lanae Crumbly, DPM (Podiatry) for Follow-up- No Medication Changes Noted   12/22/2020 [name], [name] (Cardiology) For Persistent AFIB-  Start Amiodarone 400 mg twice daily for 1 week, followed by 200 mg twice daily X 1 week, followed by 20 mg daily. Stopped Insulin Glargine 24 units (Patient reported not taking) instead she's taking 22 units Daily; Increased Rosuvastatin 10 mg to Daily- Patient instructed to return in 3-4 weeks.   12/15/2020 Ida Rogue, MD (Cardiology) (Telephone Call) Increased Metoprolol Succinate to 50 mg twice daily   11/04/2020 Ida Rogue, MD (Cardiology) for 1 month follow-up- Increase of Metoprolol Succinate to 75 mg daily, Started Lasix 20 mg prn for swelling; Follow-up in 3 months   10/17/2020 Armandina Gemma, DPM (Podiatry) For Nail Problem) - No Medication Changes Noted   09/26/2020 Ida Rogue, MD (Cardiology) for follow-up- Increase Metoprolol Succinate to 50 mg Daily; Start Eliquis 5 mg twice daily- Echocardiogram Ordered- Follow-up  in 1 month   09/13/2020 [name], MD (Endocrinology) For  Diabetes Follow-up- No Medication changes noted- Patient instructed to return in 6 months   06/10/2020 Zara Council, [name] (Urology) for Urge incontinence- Doxycycline 100 mg discontinued for completed course. Started Fluconazole 150 mg; Bladder Scan order placed; Patient instructed to follow-up in 1 year.   06/01/2020 Velora Heckler (Orthotics) for Diabetic Shoes and custom inserts   04/18/2021 Lurlean Leyden, DO (Internal Medicine) for Mass- No Medication Changes Noted   04/11/2021 Lurlean Leyden, DO (Internal Medicine) for Wound Check- Started Doxycycline 100 mg 1 tablet twice daily X 10 Days.  Hospital visits:  Admitted to the hospital on 01/23/2021 due to HF exacerbation. Discharge date was 01/31/2021 Discharged from [facility].    Admitted to the hospital on 11/30/2020 due to Cardioversion. Discharge date was 11/30/2020 Discharged from [facility]?Medications Started at [facility] Discharge:?? -started - None ID   Medication Changes at Hospital Discharge: -Changed None ID   Medications Discontinued at Hospital Discharge: -Stopped None ID   Medications that remain the same after Hospital Discharge:?? -All other medications will remain the same.   Objective:  Lab Results  Component Value Date   CREATININE 0.94 02/07/2021   BUN 14 02/07/2021   GFRNONAA >60 02/07/2021   GFRAA 102 04/11/2018   NA 132 (L) 02/07/2021   K 4.3 02/07/2021   CALCIUM 8.9 02/07/2021   CO2 30 02/07/2021   GLUCOSE 139 (H) 02/07/2021    Lab Results  Component Value Date/Time   HGBA1C 6.5 (H) 01/09/2021 08:36 AM   HGBA1C 6.5 09/13/2020 12:00 AM   HGBA1C 6.7 08/21/2019 12:00 AM   MICROALBUR Negative 08/21/2019 12:00 AM   MICROALBUR 0 02/18/2017 09:01 AM   MICROALBUR 50 04/23/2016 10:37 AM    Last diabetic Eye exam:  Lab Results  Component Value Date/Time   HMDIABEYEEXA Retinopathy (A) 01/17/2021 12:00 AM     Last diabetic Foot exam: No results found for: HMDIABFOOTEX   Lab Results  Component Value Date   CHOL 130 07/12/2020   HDL 75 07/12/2020   LDLCALC 38 07/12/2020   TRIG 86 07/12/2020   CHOLHDL 1.7 07/12/2020    Hepatic Function Latest Ref Rng & Units 01/23/2021 01/09/2021 05/13/2020  Total Protein 6.5 - 8.1 g/dL 7.1 6.2 6.8  Albumin 3.5 - 5.0 g/dL 4.0 - 4.1  AST 15 - 41 U/L 42(H) 28 26  ALT 0 - 44 U/L 34 26 22  Alk Phosphatase 38 - 126 U/L 81 - 53  Total Bilirubin 0.3 - 1.2 mg/dL 1.6(H) 1.0 1.3(H)    Lab Results  Component Value Date/Time   TSH 4.83 (H) 01/09/2021 08:36 AM   TSH 2.82 04/11/2018 10:32 AM    CBC Latest Ref Rng & Units 01/31/2021 01/30/2021 01/27/2021  WBC 4.0 - 10.5 K/uL 8.4 9.4 10.0  Hemoglobin 12.0 - 15.0 g/dL 13.9 14.2 13.9  Hematocrit 36.0 - 46.0 % 41.1 41.1 39.5  Platelets 150 - 400 K/uL 285 275 202    Lab Results  Component Value Date/Time   VD25OH 28 (L) 08/22/2017 09:35 AM    Clinical ASCVD: No  The 10-year ASCVD risk score Mikey Bussing DC Jr., et al., 2013) is: 54.7%   Values used to calculate the score:     Age: 80 years     Sex: Female     Is Non-Hispanic African American: No     Diabetic: Yes  Tobacco smoker: No     Systolic Blood Pressure: 349 mmHg     Is BP treated: Yes     HDL Cholesterol: 75 mg/dL     Total Cholesterol: 130 mg/dL    Depression screen [facility] 2/9 02/09/2021 02/02/2021 01/09/2021  Decreased Interest 0 0 0  Down, Depressed, Hopeless 0 0 0  PHQ - 2 Score 0 0 0  Altered sleeping - - -  Tired, decreased energy - - -  Change in appetite - - -  Feeling bad or failure about yourself  - - -  Trouble concentrating - - -  Moving slowly or fidgety/restless - - -  Suicidal thoughts - - -  PHQ-9 Score - - -  Difficult doing work/chores - - -  Some recent data might be hidden     -Last DEXA Scan: 08/13/17   T-Score femoral neck: -3.3  T-Score total hip: -2.2  T-Score lumbar spine: NA  T-Score forearm radius: -1.6  10-year  probability of major osteoporotic fracture: NA  10-year probability of hip fracture: NA  Social History   Tobacco Use  Smoking Status Never  Smokeless Tobacco Never   BP Readings from Last 3 Encounters:  02/09/21 138/76  02/02/21 (!) 149/46  01/31/21 118/84   Pulse Readings from Last 3 Encounters:  02/09/21 86  02/02/21 67  01/31/21 (!) 54   Wt Readings from Last 3 Encounters:  02/09/21 181 lb (82.1 kg)  02/02/21 184 lb (83.5 kg)  01/31/21 182 lb 14.4 oz (83 kg)   BMI Readings from Last 3 Encounters:  02/09/21 32.06 kg/m  02/02/21 32.59 kg/m  01/31/21 32.40 kg/m    Assessment/Interventions: Review of patient past medical history, allergies, medications, health status, including review of consultants reports, laboratory and other test data, was performed as part of comprehensive evaluation and provision of chronic care management services.   SDOH:  (Social Determinants of Health) assessments and interventions performed: Yes SDOH Interventions    Flowsheet Row Most Recent Value  SDOH Interventions   Financial Strain Interventions Intervention Not Indicated      SDOH Screenings   Alcohol Screen: Low Risk    Last Alcohol Screening Score (AUDIT): 2  Depression (PHQ2-9): Low Risk    PHQ-2 Score: 0  Financial Resource Strain: Low Risk    Difficulty of Paying Living Expenses: Not hard at all  Food Insecurity: No Food Insecurity   Worried About Charity fundraiser in the Last Year: Never true   Ran Out of Food in the Last Year: Never true  Housing: Low Risk    Last Housing Risk Score: 0  Physical Activity: Sufficiently Active   Days of Exercise per Week: 7 days   Minutes of Exercise per Session: 30 min  Social Connections: Engineer, building services of Communication with Friends and Family: More than three times a week   Frequency of Social Gatherings with Friends and Family: More than three times a week   Attends Religious Services: More than 4 times per  year   Active Member of [employer] or Organizations: Yes   Attends Music therapist: More than 4 times per year   Marital Status: Married  Stress: No Stress Concern Present   Feeling of Stress : Not at all  Tobacco Use: Low Risk    Smoking Tobacco Use: Never   Smokeless Tobacco Use: Never  Transportation Needs: No Transportation Needs   Lack of Transportation (Medical): No   Lack of Transportation (Non-Medical): No  CCM Care Plan  Allergies  Allergen Reactions   Cyclobenzaprine Hypertension   Cheese Other (See Comments)    bloating   Ciprofloxacin Hcl Other (See Comments)    Muscle pain   Coconut Oil     Upset stomach   Keflex [Cephalexin] Other (See Comments)    Patient prefers not to take this medication due to the side effects, caused tendon issues    Medications Reviewed Today     Reviewed by Germaine Pomfret, [facility] (Pharmacist) on 02/09/21 at 1259  Med List Status: <None>   Medication Order Taking? Sig Documenting Provider Last Dose Status Informant  acetaminophen (TYLENOL) 650 MG CR tablet [id number] Yes Take 1,300 mg by mouth at bedtime. [provider] Taking Active Self  ALFALFA PO [id number] Yes Take 1 tablet by mouth daily. [provider] Taking Active Self  amiodarone (PACERONE) 200 MG tablet [id number] Yes Take 1 tablet (200 mg total) by mouth 2 (two) times daily. Take for 2 weeks as per cardio & will f/u w/ Dr. Rockey Situ in 2 weeks Wyvonnia Dusky, MD Taking Active   apixaban (ELIQUIS) 5 MG TABS tablet [id number] Yes Take 1 tablet (5 mg total) by mouth 2 (two) times daily. Minna Merritts, MD Taking Active Self  ascorbic Acid (VITAMIN C) 500 MG CPCR [id number] Yes Take 500 mg by mouth daily. [provider] Taking Active Self  B Complex-C (B-COMPLEX WITH VITAMIN C) tablet [id number] Yes Take 1 tablet by mouth 2 (two) times daily. [provider] Taking Active Self  BD PEN NEEDLE NANO U/F 32G X 4 MM MISC [id number]  USE 4  TIMES DAILY (HUMALOG 3 TIMES DAILY AND LANTUS ONCE DAILY) OFFICE NOTIFIED 08/06/17 Steele Sizer, MD  Active Self  BLACK ELDERBERRY,BERRY-FLOWER, PO [id number] Yes Take 15 mLs by mouth daily. Liquid [provider] Taking Active Self  Calcium Carbonate (CALCIUM 600 PO) [id number] Yes Take 600 mg by mouth daily. [provider] Taking Active Self  cholecalciferol (VITAMIN D) 1000 UNITS tablet [id number] Yes Take 1,000 Units by mouth daily. [place] [provider] Taking Active Self  clobetasol ointment (TEMOVATE) 0.05 % [id number]  APPLY 1 APPLICATION TOPICALLY 2 TIMES DAILY AS NEEDED (BUG BITES).  Patient taking differently: Apply 1 application topically daily as needed (Bug bites).   Steele Sizer, MD  Active   Coenzyme Q10 (COQ10) 200 MG CAPS [id number] Yes Take 200 mg by mouth daily. [provider] Taking Active Self  Continuous Blood Gluc Sensor ([place]) [place] [id number]  by Does not apply route. [provider]  Active Self  Elastic Bandages & Supports ([place]) [place] [id number]  1 each by Does not apply route daily. Steele Sizer, MD  Active Self  estradiol (ESTRACE VAGINAL) 0.1 MG/GM vaginal cream [id number] Yes 1/2 gm once weekly using applicator, apply blueberry sized amount of cream using tip of finger to urethra twice weekly  Patient taking differently: Place 1 Applicatorful vaginally See admin instructions. 1/2 gm once weekly using applicator, apply blueberry sized amount of cream using tip of finger to urethra three weekly   McGowan, Hunt Oris, [name] Taking Active Self  Exenatide ER (BYDUREON BCISE) 2 MG/0.85ML AUIJ [id number] Yes Inject 2 mg into the skin every Sunday. [provider] Taking Active Self  fluticasone (FLONASE) 50 MCG/ACT nasal spray [id number]  PLACE 2 SPRAYS INTO BOTH NOSTRILS DAILY.  Patient taking differently: Place 2 sprays into both nostrils daily as needed for rhinitis.    Steele Sizer, MD  Active Self  furosemide (LASIX) 40 MG tablet [id number] Yes Take 1 tablet (40 mg total) by mouth daily. Wyvonnia Dusky, MD Taking Active   GARLIC 4174 PO [id number] Yes Take 1,500 mg by mouth daily. [provider] Taking Active Self  Glucosamine 500 MG TABS [id number] Yes Take 500 mg by mouth daily. [provider] Taking Active Self  HUMALOG KWIKPEN 100 UNIT/ML Mayer Masker [id number] Yes Inject 4-12 Units into the skin 3 (three) times daily before meals. If needed sliding scale [provider] Taking Active   insulin glargine (LANTUS) 100 unit/mL SOPN [id number] Yes Inject 22 Units into the skin at bedtime. [provider] Taking Active Self  Krill Oil 1000 MG CAPS [id number] Yes Take 1,000 mg by mouth daily. [provider] Taking Active Self  losartan (COZAAR) 25 MG tablet [id number] Yes Take 0.5 tablets (12.5 mg total) by mouth daily. Wyvonnia Dusky, MD Taking Active   Mag Aspart-Potassium Aspart (POTASSIUM & MAGNESIUM ASPARTAT PO) [id number] Yes Take 1 tablet by mouth daily. [provider] Taking Active Self   Patient not taking:   Discontinued 01/23/21 1515 Multiple Vitamins-Minerals (PRESERVISION AREDS 2) CAPS [id number] Yes Take 1 capsule by mouth 2 (two) times daily. [provider] Taking Active Self  OVER THE [place] [id number]  Place 1 application into both eyes See admin instructions. Blephadex eyelid foam, apply to eyelids once daily [provider]  Active Self  OVER THE COUNTER MEDICATION [id number]  Take 1 tablet by mouth daily. Vita-Lea Gold otc supplement With out vitamin K ([place] products) [provider]  Active Self  OVER THE COUNTER MEDICATION [id number]  Take 1 tablet by mouth See admin instructions. Nutriferon otc supplement take Take 1 tablet on Sun, Tues, Thurs, Sat. Take 1 tablet twice daily on Mon, Wed, and Fri [provider]  Active Self  Probiotic Product  (PROBIOTIC PO) [id number] Yes Take 1 capsule by mouth at bedtime. [provider] Taking Active Self  PROLIA 60 MG/ML SOSY injection [id number] Yes Inject 60 mg into the skin every 6 (six) months. [provider] Taking Active Self  Propylene Glycol (SYSTANE BALANCE OP) [id number] Yes Place 1 drop into both eyes daily as needed (dry eyes). [provider] Taking Active Self  rosuvastatin (CRESTOR) 10 MG tablet [id number] Yes Take 1 tablet (10 mg total) by mouth daily.  Patient taking differently: Take 10 mg by mouth at bedtime.   Minna Merritts, MD Taking Active Self  Sodium Chloride-Sodium Bicarb (NETI POT SINUS [place] NA) [id number] Yes Place 1 Dose into the nose at bedtime. [provider] Taking Active Self  tiZANidine (ZANAFLEX) 2 MG tablet [id number] Yes Take 1 tablet (2 mg total) by mouth at bedtime. Steele Sizer, MD Taking Active   Vitamin D, Ergocalciferol, (DRISDOL) 1.25 MG (50000 UT) CAPS capsule [id number] Yes Take 50,000 Units by mouth every Saturday. [provider] Taking Active Self  Med List Note Lona Millard, RN 01/13/15 1037): No UDS needed No meds prescribed by [facility]            Patient Active Problem List   Diagnosis Date Noted   Persistent atrial fibrillation [facility])    Hypertensive urgency    Acute on chronic combined systolic and diastolic CHF (congestive heart failure) (HCC)    Elevated troponin    AF (paroxysmal atrial fibrillation) ([place]) 01/23/2021   Type 2 diabetes mellitus with hyperlipidemia ([place]) 01/23/2021   Hypertensive emergency 01/23/2021   Acute respiratory failure with hypoxia (  [place]) 01/23/2021   Clavi 04/07/2020   Acute left-sided low back pain without sciatica 11/25/2019   Trochanteric bursitis of left hip 11/25/2019   Plantar fasciitis, bilateral 05/21/2019   Age-related osteoporosis without current pathological fracture 11/05/2017   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([place]) 07/09/2017   Atherosclerosis of abdominal aorta  ([place]) 07/09/2017   Bilateral carotid bruits 08/24/2015   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   Atrial flutter ([place]) 02/22/2015   Chronic pain 01/10/2015   Type 2 diabetes, uncontrolled, 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 ([place]) 01/10/2015   Fatigue 01/10/2015   History of melanoma excision 01/10/2015   Morbid obesity ([place]) 01/10/2015   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   Spinal stenosis, lumbar region, 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   DDD (degenerative disc disease), lumbar 11/14/2014   Greater trochanteric bursitis of both hips 11/14/2014   Piriformis syndrome 11/14/2014   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   Hyperlipemia 11/07/2009   Hypertension, benign 11/07/2009   Atherosclerosis of renal artery ([place]) 11/07/2009   Hyperlipidemia due to type 2 diabetes mellitus ([place]) 11/07/2009   Perennial allergic rhinitis 11/27/2007   Lumbosacral neuritis 01/17/2007    Immunization History  Administered Date(s) Administered   Fluad Quad(high Dose 65+) 03/19/2019, 04/20/2020   Influenza, High Dose Seasonal PF 03/10/2015, 03/02/2016, 03/12/2017, 04/07/2018   Moderna Sars-Covid-2 Vaccination 07/14/2019, 08/11/2019, 05/17/2020   Pneumococcal Conjugate-13 07/13/2013   Pneumococcal Polysaccharide-23 03/31/2010   Tdap 03/07/2012   Zoster Recombinat (Shingrix) 02/02/2017, 04/05/2017   Zoster, Live 06/01/2010    Conditions to be addressed/monitored:  Hypertension, Hyperlipidemia, Diabetes, Atrial Fibrillation, Heart Failure, Coronary Artery Disease, Osteoporosis, Allergic Rhinitis, and Chronic Pain  Care Plan : General Pharmacy (Adult)  Updates made by Germaine Pomfret, RPH since 02/10/2021 12:00 AM     Problem: Hypertension, Hyperlipidemia, Diabetes, Atrial Fibrillation, Heart Failure, Coronary Artery Disease, Osteoporosis, Allergic Rhinitis, and Chronic Pain   Priority: High     Long-Range Goal: Patient-Specific Goal    Start Date: 02/10/2021  Expected End Date: 02/10/2022  This Visit's Progress: On track  Priority: High  Note:   Current Barriers:  No barriers noted  Pharmacist Clinical Goal(s):  Patient will maintain control of diabetes as evidenced by A1c less than 8%  maintain control of heart failure as evidenced by stable weight, fluid status through collaboration with PharmD and provider.   Interventions: 1:1 collaboration with Steele Sizer, MD regarding development and update of comprehensive plan of care as evidenced by provider attestation and co-signature Inter-disciplinary care team collaboration (see longitudinal plan of care) Comprehensive medication review performed; medication list updated in electronic medical record  Diabetes (A1c goal <8%) -Controlled -Managed by Dr. Honor Junes  -Current medications: Byduren BCISE 2 mg weekly Humalog 4-12 units three times daily (has not needed) Lantus 22 units nightly (15 nightly)  -Medications previously tried: NA  -Current home glucose readings: Utilizing freestyle libre system -Denies hypoglycemic/hyperglycemic symptoms -Recommended to continue current medication  Heart Failure (Goal: manage symptoms and prevent exacerbations) -Controlled -Last ejection fraction: 45-50% (Date: 01/23/21) -HF type: Combined Systolic and Diastolic -NYHA Class: II (slight limitation of activity) -AHA HF Stage: C (Heart disease and symptoms present) -Current treatment: Furosemide 40 mg daily  Losartan 25 mg 1/2 tablet daily  -Medications previously tried: Metoprolol (Bradycardia), irbesartan, HCTZ, Telmisartan -Current home BP/HR readings: NA -Patient weighs daily, reports weight has been stable -Recommended to continue current medication  Atrial Fibrillation (Goal: prevent stroke and major bleeding) -Controlled -CHADSVASC: 6 -Current treatment: Rate control: Amiodarone 200  mg twice daily Anticoagulation: Eliquis 5 mg twice daily  -Medications  previously tried: Flecainide,  -Patient reports she has maintained sinus rhythm -Patient endorses some worsening hair loss recently, last TSH was mildly elevated.  -Counseled on avoidance of NSAIDs due to increased bleeding risk with anticoagulants; -Recommend decreasing amiodarone to 200 mg daily  -Recommend rechecking TSH, T4  Hyperlipidemia: (LDL goal < 70) -Controlled -Current treatment: Rosuvastatin 10 mg daily  -Medications previously tried: NA  -Previously had extra supply, has caught up now.  -Recommended to continue current medication  Osteoporosis (Goal Prevent bone fractures) -Controlled -Patient is a candidate for pharmacologic treatment due to T-Score < -2.5 in femoral neck -Current treatment  Ergocalciferol 50,000 units weekly Vitamin D3 1000 units daily Prolia 60 mg into the skin every 6 months (next shot due September) -Medications previously tried: NA  -Recommend stopping vitamin D3 for now. Could consider stopping ergocalciferol at this point, will defer until upcoming endocrinology appointment -Recommended to continue current medication  Patient Goals/Self-Care Activities Patient will:  - check glucose 3-4 times daily, document, and provide at future appointments check blood pressure weekly, document, and provide at future appointments weigh daily, and contact provider if weight gain of greater than 2 pounds in 24 hours  Follow Up Plan: Telephone follow up appointment with care management team member scheduled for:  05/17/2021 at 3:00 PM       Medication Assistance: None required.  Patient affirms current coverage meets needs.  Compliance/Adherence/Medication fill history: Care Gaps: COVID-19 Vaccine Influenza vaccine   Star-Rating Drugs: Rosuvastatin 10 mg last filled on 01/16/2021 for a 90-Day supply with [facility] Losartan 25 mg last filled on 01/31/2021 for a 30-Day supply with [facility]  Patient[facility]  [id number] [place] [place]Phone: [phone]: [phone] [place] [address]Phone: [phone]: [phone] Uses pill box? Yes Pt endorses 100% compliance  We discussed: Current pharmacy is preferred with insurance plan and patient is satisfied with pharmacy services Patient decided to: Continue current medication management strategy  Care Plan and Follow Up Patient Decision:  Patient agrees to Care Plan and Follow-up.  Plan: Telephone follow up appointment with care management team member scheduled for:  05/17/2021 at 3:00 PM  AMalva Limes [phone]

## 2021-02-09 NOTE — Progress Notes (Signed)
Name: Bonnie Rowland   MRN: [id number]    DOB: [birth year]   Date:02/09/2021       Progress Note  Subjective  Chief Complaint  Hospital Follow-Up  HPI  Admitted: 01/24/21 Discharged: 01/31/21  Medication reconciliation was done today   Bonnie Rowland had Afib and had a cardioversion on 01/20/2021, she felt dizzy the following day and was advised to stop taking Metoprolol , she was okay for a few days after that but on 07/26 she developed SOB, leg edema but no chest pain. She was found to be hypoxic, had bibasilar infiltrates on CXR, she was admitted for treatment of acute on chronic CHF and was back in Afib. She had multiple labs and studies done and repeat cardioversion on 01/27/2021 that was successful  She is currently taking losartan half pill daily, still off metoprolol , taking amiodarone 200 mg BID. She states bp at home has been around 130's. She has difficulty cutting losartan pill in half. Advised her to take full pill tomorrow morning since she will see cardiologist - it may not cause hypotension and it will simplify her regiment   DMII: since discharge she has been very strict with her diet , she noticed hypoglycemic episodes and is down from 23 units of lantus to 15 units and only using pre-meal insulin when necessary. She has a follow up with endo in a few weeks. Advised to contact him sooner if she continues to have hypoglycemic episodes.   Hyponatremia: she has been avoiding salt, sodium down to 132, she is on diuretic, potassium was normal. Advised to try to avoid a lot of free water and cardiologist may want to repeat level  She is getting home PT, also has seen the CHF clinic , she is afraid of walking without walker , but denies fatigue.    Patient Active Problem List   Diagnosis Date Noted   Persistent atrial fibrillation [facility])    Hypertensive urgency    Acute on chronic combined systolic and diastolic CHF (congestive heart failure) (HCC)    Elevated troponin    AF  (paroxysmal atrial fibrillation) ([place]) 01/23/2021   Type 2 diabetes mellitus with hyperlipidemia ([place]) 01/23/2021   Hypertensive emergency 01/23/2021   Acute respiratory failure with hypoxia ([place]) 01/23/2021   Clavi 04/07/2020   Acute left-sided low back pain without sciatica 11/25/2019   Trochanteric bursitis of left hip 11/25/2019   Plantar fasciitis, bilateral 05/21/2019   Age-related osteoporosis without current pathological fracture 11/05/2017   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([place]) 07/09/2017   Atherosclerosis of abdominal aorta ([place]) 07/09/2017   Bilateral carotid bruits 08/24/2015   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   Atrial flutter ([place]) 02/22/2015   Chronic pain 01/10/2015   Type 2 diabetes, uncontrolled, 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 ([place]) 01/10/2015   Fatigue 01/10/2015   History of melanoma excision 01/10/2015   Morbid obesity ([place]) 01/10/2015   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   Spinal stenosis, lumbar region, 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   DDD (degenerative disc disease), lumbar 11/14/2014   Greater trochanteric bursitis of both hips 11/14/2014   Piriformis syndrome 11/14/2014   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   Hyperlipemia 11/07/2009   Hypertension, benign 11/07/2009   Atherosclerosis of renal artery ([place]) 11/07/2009   Hyperlipidemia due to type 2 diabetes mellitus ([place]) 11/07/2009   Perennial allergic rhinitis 11/27/2007   Lumbosacral neuritis 01/17/2007    Past Surgical History:  Procedure Laterality Date   APPENDECTOMY  CARDIAC CATHETERIZATION     CARDIOVERSION N/A 11/30/2020   Procedure: CARDIOVERSION;  Surgeon: Minna Merritts, MD;  Location: [facility] OR[facility];  Service: Cardiovascular;  Laterality: N/A;   CARDIOVERSION N/A 01/20/2021   Procedure: CARDIOVERSION;  Surgeon: Minna Merritts, MD;  Location: [place] [facility];  Service: Cardiovascular;  Laterality: N/A;   CARDIOVERSION N/A 01/30/2021   Procedure:  CARDIOVERSION;  Surgeon: Wellington Hampshire, MD;  Location: [facility] OR[facility];  Service: Cardiovascular;  Laterality: N/A;   cataract surgery     COLONOSCOPY     COLONOSCOPY WITH PROPOFOL N/A 05/09/2016   Procedure: COLONOSCOPY WITH PROPOFOL;  Surgeon: Robert Bellow, MD;  Location: [facility] END[facility]COPY;  Service: Endoscopy;  Laterality: N/A;   DIAGNOSTIC MAMMOGRAM     EYE SURGERY Bilateral    Cataract Extraction   JOINT REPLACEMENT  2016   KNEE ARTHROPLASTY Right 03/30/2015   Procedure: COMPUTER ASSISTED TOTAL KNEE ARTHROPLASTY;  Surgeon: Dereck Leep, MD;  Location: [facility] OR[facility];  Service: Orthopedics;  Laterality: Right;   KNEE ARTHROSCOPY Right    KNEE CLOSED REDUCTION Right 05/23/2015   Procedure: CLOSED MANIPULATION KNEE;  Surgeon: Dereck Leep, MD;  Location: [facility];  Service: Orthopedics;  Laterality: Right;   RIGHT/LEFT HEART CATH AND CORONARY ANGIOGRAPHY N/A 01/25/2021   Procedure: RIGHT/LEFT HEART CATH AND CORONARY ANGIOGRAPHY;  Surgeon: Nelva Bush, MD;  Location: [place] [facility];  Service: Cardiovascular;  Laterality: N/A;   TONSILLECTOMY      Family History  Problem Relation Age of Onset   Diabetes Mother    Hypertension Mother    Cancer Mother    Kidney disease Mother    Hypertension Father    Cancer Father        Prostate   Breast cancer Maternal Aunt 64   Colon cancer Son    Kidney cancer Neg Hx    Bladder Cancer Neg Hx     Social History   Tobacco Use   Smoking status: Never   Smokeless tobacco: Never  Substance Use Topics   Alcohol use: Not Currently    Alcohol/week: 1.0 standard drink    Types: 1 Glasses of wine per week     Current Outpatient Medications:    acetaminophen (TYLENOL) 650 MG CR tablet, Take 1,300 mg by mouth at bedtime., Disp: , Rfl:    ALFALFA PO, Take 1 tablet by mouth daily., Disp: , Rfl:    amiodarone (PACERONE) 200 MG tablet, Take 1 tablet (200 mg total) by mouth 2 (two) times daily. Take for 2 weeks as per cardio & will f/u w/ Dr.  Rockey Situ in 2 weeks, Disp: 180 tablet, Rfl: 3   apixaban (ELIQUIS) 5 MG TABS tablet, Take 1 tablet (5 mg total) by mouth 2 (two) times daily., Disp: 30 tablet, Rfl: 11   ascorbic Acid (VITAMIN C) 500 MG CPCR, Take 500 mg by mouth daily., Disp: , Rfl:    B Complex-C (B-COMPLEX WITH VITAMIN C) tablet, Take 1 tablet by mouth 2 (two) times daily., Disp: , Rfl:    BD PEN NEEDLE NANO U/F 32G X 4 MM MISC, USE 4 TIMES DAILY (HUMALOG 3 TIMES DAILY AND LANTUS ONCE DAILY) OFFICE NOTIFIED 08/06/17, Disp: 90 each, Rfl: 1   BLACK ELDERBERRY,BERRY-FLOWER, PO, Take 15 mLs by mouth daily. Liquid, Disp: , Rfl:    Calcium Carbonate (CALCIUM 600 PO), Take 600 mg by mouth daily., Disp: , Rfl:    cholecalciferol (VITAMIN D) 1000 UNITS tablet, Take 1,000 Units by mouth daily. [place], Disp: ,  Rfl:    clobetasol ointment (TEMOVATE) 6.60 %, APPLY 1 APPLICATION TOPICALLY 2 TIMES DAILY AS NEEDED (BUG BITES). (Patient taking differently: Apply 1 application topically daily as needed (Bug bites).), Disp: 30 g, Rfl: 0   Coenzyme Q10 (COQ10) 200 MG CAPS, Take 200 mg by mouth daily., Disp: , Rfl:    Continuous Blood Gluc Sensor ([place]) MISC, by Does not apply route., Disp: , Rfl:    Elastic Bandages & Supports ([place]) MISC, 1 each by Does not apply route daily., Disp: 2 each, Rfl: 5   estradiol (ESTRACE VAGINAL) 0.1 MG/GM vaginal cream, 1/2 gm once weekly using applicator, apply blueberry sized amount of cream using tip of finger to urethra twice weekly (Patient taking differently: Place 1 Applicatorful vaginally See admin instructions. 1/2 gm once weekly using applicator, apply blueberry sized amount of cream using tip of finger to urethra three weekly), Disp: 30 g, Rfl: 3   Exenatide ER (BYDUREON BCISE) 2 MG/0.85ML AUIJ, Inject 2 mg into the skin every Sunday., Disp: , Rfl:    fluticasone (FLONASE) 50 MCG/ACT nasal spray, PLACE 2 SPRAYS INTO BOTH NOSTRILS DAILY. (Patient taking  differently: Place 2 sprays into both nostrils daily as needed for rhinitis.), Disp: 8.5 g, Rfl: 2   furosemide (LASIX) 40 MG tablet, Take 1 tablet (40 mg total) by mouth daily., Disp: 30 tablet, Rfl: 0   GARLIC 6004 PO, Take 5,997 mg by mouth daily., Disp: , Rfl:    Glucosamine 500 MG TABS, Take 500 mg by mouth daily., Disp: , Rfl:    HUMALOG KWIKPEN 100 UNIT/ML KiwkPen, Inject 4-12 Units into the skin 3 (three) times daily before meals. If needed sliding scale, Disp: , Rfl: 3   insulin glargine (LANTUS) 100 unit/mL SOPN, Inject 22 Units into the skin at bedtime., Disp: , Rfl:    Krill Oil 1000 MG CAPS, Take 1,000 mg by mouth daily., Disp: , Rfl:    losartan (COZAAR) 25 MG tablet, Take 0.5 tablets (12.5 mg total) by mouth daily., Disp: 15 tablet, Rfl: 0   Mag Aspart-Potassium Aspart (POTASSIUM & MAGNESIUM ASPARTAT PO), Take 1 tablet by mouth daily., Disp: , Rfl:    Multiple Vitamins-Minerals (PRESERVISION AREDS 2) CAPS, Take 1 capsule by mouth 2 (two) times daily., Disp: , Rfl:    OVER THE COUNTER MEDICATION, Place 1 application into both eyes See admin instructions. Blephadex eyelid foam, apply to eyelids once daily, Disp: , Rfl:    OVER THE COUNTER MEDICATION, Take 1 tablet by mouth daily. Vita-Lea Gold otc supplement With out vitamin K (Shaklee products), Disp: , Rfl:    OVER THE COUNTER MEDICATION, Take 1 tablet by mouth See admin instructions. Nutriferon otc supplement take Take 1 tablet on Sun, Tues, Thurs, Sat. Take 1 tablet twice daily on Mon, Wed, and Fri, Disp: , Rfl:    Probiotic Product (PROBIOTIC PO), Take 1 capsule by mouth at bedtime., Disp: , Rfl:    PROLIA 60 MG/ML SOSY injection, Inject 60 mg into the skin every 6 (six) months., Disp: , Rfl:    Propylene Glycol (SYSTANE BALANCE OP), Place 1 drop into both eyes daily as needed (dry eyes)., Disp: , Rfl:    rosuvastatin (CRESTOR) 10 MG tablet, Take 1 tablet (10 mg total) by mouth daily. (Patient taking differently: Take 10 mg by mouth  at bedtime.), Disp: 90 tablet, Rfl: 3   Sodium Chloride-Sodium Bicarb (NETI POT SINUS WASH NA), Place 1 Dose into the nose at bedtime., Disp: ,  Rfl:    tiZANidine (ZANAFLEX) 2 MG tablet, Take 1 tablet (2 mg total) by mouth at bedtime., Disp: 90 tablet, Rfl: 1   Vitamin D, Ergocalciferol, (DRISDOL) 1.25 MG (50000 UT) CAPS capsule, Take 50,000 Units by mouth every Saturday., Disp: , Rfl:   Allergies  Allergen Reactions   Cyclobenzaprine Hypertension   Cheese Other (See Comments)    bloating   Ciprofloxacin Hcl Other (See Comments)    Muscle pain   Coconut Oil     Upset stomach   Keflex [Cephalexin] Other (See Comments)    Patient prefers not to take this medication due to the side effects, caused tendon issues    I personally reviewed active problem list, medication list, allergies, family history, social history, health maintenance, notes from last encounter with the patient/caregiver today.   ROS  Constitutional: Negative for fever ,weight change.  Respiratory: Negative for cough and shortness of breath.   Cardiovascular: Negative for chest pain or palpitations.  Gastrointestinal: Negative for abdominal pain, no bowel changes.  Musculoskeletal: positive for gait problem -using walker  Skin: Negative for rash.  Neurological: Negative for dizziness or headache.  No other specific complaints in a complete review of systems (except as listed in HPI above).   Objective  Vitals:   02/09/21 1125 02/09/21 1131  BP: 140/78 138/76  Pulse: 86   Resp: 16   Temp: 98 F (36.7 C)   SpO2: 95%   Weight: 181 lb (82.1 kg)   Height: $Remove'5\' 3"'xHFdwvI$  (1.6 m)     Body mass index is 32.06 kg/m.  Physical Exam  Constitutional: Patient appears well-developed and well-nourished. Obese  No distress.  HEENT: head atraumatic, normocephalic, pupils equal and reactive to light, neck supple Cardiovascular: Normal rate, regular rhythm and normal heart sounds.  No murmur heard. 1 plus ankle  edema. Pulmonary/Chest: Effort normal and breath sounds normal. No respiratory distress. Abdominal: Soft.  There is no tenderness. Psychiatric: Patient has a normal mood and affect. behavior is normal. Judgment and thought content normal.   Recent Results (from the past 2160 hour(s))  Glucose, capillary     Status: Abnormal   Collection Time: 11/30/20  7:50 AM  Result Value Ref Range   Glucose-Capillary 144 (H) 70 - 99 mg/dL    Comment: Glucose reference range applies only to samples taken after fasting for at least 8 hours.  COMPLETE METABOLIC PANEL WITH GFR     Status: Abnormal   Collection Time: 01/09/21  8:36 AM  Result Value Ref Range   Glucose, Bld 111 (H) 65 - 99 mg/dL    Comment: .            Fasting reference interval . For someone without known diabetes, a glucose value between 100 and 125 mg/dL is consistent with prediabetes and should be confirmed with a follow-up test. .    BUN 13 7 - 25 mg/dL   Creat 0.71 0.60 - 1.00 mg/dL   eGFR 87 > OR = 60 mL/min/1.39m2    Comment: The eGFR is based on the CKD-EPI 2021 equation. To calculate  the new eGFR from a previous Creatinine or Cystatin C result, go to https://www.kidney.org/professionals/ kdoqi/gfr%5Fcalculator    BUN/Creatinine Ratio NOT APPLICABLE 6 - 22 (calc)   Sodium 138 135 - 146 mmol/L   Potassium 4.1 3.5 - 5.3 mmol/L   Chloride 98 98 - 110 mmol/L   CO2 34 (H) 20 - 32 mmol/L   Calcium 9.3 8.6 - 10.4 mg/dL   Total  Protein 6.2 6.1 - 8.1 g/dL   Albumin 3.8 3.6 - 5.1 g/dL   Globulin 2.4 1.9 - 3.7 g/dL (calc)   AG Ratio 1.6 1.0 - 2.5 (calc)   Total Bilirubin 1.0 0.2 - 1.2 mg/dL   Alkaline phosphatase (APISO) 72 37 - 153 U/L   AST 28 10 - 35 U/L   ALT 26 6 - 29 U/L  Hemoglobin A1c     Status: Abnormal   Collection Time: 01/09/21  8:36 AM  Result Value Ref Range   Hgb A1c MFr Bld 6.5 (H) <5.7 % of total Hgb    Comment: For someone without known diabetes, a hemoglobin A1c value of 6.5% or greater indicates  that they may have  diabetes and this should be confirmed with a follow-up  test. . For someone with known diabetes, a value <7% indicates  that their diabetes is well controlled and a value  greater than or equal to 7% indicates suboptimal  control. A1c targets should be individualized based on  duration of diabetes, age, comorbid conditions, and  other considerations. . Currently, no consensus exists regarding use of hemoglobin A1c for diagnosis of diabetes for children. .    Mean Plasma Glucose 140 mg/dL   eAG (mmol/L) 7.7 mmol/L  TSH     Status: Abnormal   Collection Time: 01/09/21  8:36 AM  Result Value Ref Range   TSH 4.83 (H) 0.40 - 4.50 mIU/L  CBC     Status: Abnormal   Collection Time: 01/16/21 12:13 PM  Result Value Ref Range   WBC 6.6 3.4 - 10.8 x10E3/uL   RBC 4.32 3.77 - 5.28 x10E6/uL   Hemoglobin 13.1 11.1 - 15.9 g/dL   Hematocrit 42.3 34.0 - 46.6 %   MCV 98 (H) 79 - 97 fL   MCH 30.3 26.6 - 33.0 pg   MCHC 31.0 (L) 31.5 - 35.7 g/dL   RDW 12.3 11.7 - 15.4 %   Platelets 242 150 - 450 [id number]  Basic metabolic panel     Status: Abnormal   Collection Time: 01/16/21 12:13 PM  Result Value Ref Range   Glucose 120 (H) 65 - 99 mg/dL   BUN 11 8 - 27 mg/dL   Creatinine, Ser 0.77 0.57 - 1.00 mg/dL   eGFR 79 >59 mL/min/1.73   BUN/Creatinine Ratio 14 12 - 28   Sodium 140 134 - 144 mmol/L   Potassium 4.3 3.5 - 5.2 mmol/L   Chloride 98 96 - 106 mmol/L   CO2 26 20 - 29 mmol/L   Calcium 9.7 8.7 - 10.3 mg/dL  HM DIABETES EYE EXAM     Status: Abnormal   Collection Time: 01/17/21 12:00 AM  Result Value Ref Range   HM Diabetic Eye Exam Retinopathy (A) No Retinopathy  Glucose, capillary     Status: Abnormal   Collection Time: 01/20/21  7:20 AM  Result Value Ref Range   Glucose-Capillary 131 (H) 70 - 99 mg/dL    Comment: Glucose reference range applies only to samples taken after fasting for at least 8 hours.  CBC     Status: Abnormal   Collection Time: 01/22/21 11:21 PM   Result Value Ref Range   WBC 11.3 (H) 4.0 - 10.5 K/uL   RBC 4.25 3.87 - 5.11 MIL/uL   Hemoglobin 13.7 12.0 - 15.0 g/dL   HCT 40.7 36.0 - 46.0 %   MCV 95.8 80.0 - 100.0 fL   MCH 32.2 26.0 - 34.0 pg   MCHC 33.7  30.0 - 36.0 g/dL   RDW 13.2 11.5 - 15.5 %   Platelets 222 150 - 400 K/uL   nRBC 0.0 0.0 - 0.2 %    Comment: Performed at [facility], [place], [place] [address]  Troponin I (High Sensitivity)     Status: None   Collection Time: 01/22/21 11:21 PM  Result Value Ref Range   Troponin I (High Sensitivity) 10 <18 ng/L    Comment: (NOTE) Elevated high sensitivity troponin I (hsTnI) values and significant  changes across serial measurements may suggest ACS but many other  chronic and acute conditions are known to elevate hsTnI results.  Refer to the "Links" section for chest pain algorithms and additional  guidance. Performed at [facility], [address] [place], [place] [address]   Brain natriuretic peptide     Status: Abnormal   Collection Time: 01/22/21 11:21 PM  Result Value Ref Range   B Natriuretic Peptide 237.3 (H) 0.0 - 100.0 pg/mL    Comment: Performed at [facility], [address] [place], [place] [address]  Blood gas, arterial     Status: Abnormal   Collection Time: 01/23/21 12:15 AM  Result Value Ref Range   FIO2 65.00    Delivery systems BILEVEL POSITIVE AIRWAY PRESSURE    Inspiratory PAP 10    Expiratory PAP 5    pH, Arterial 7.36 7.350 - 7.450   pCO2 arterial 49 (H) 32.0 - 48.0 mmHg   pO2, Arterial 59 (L) 83.0 - 108.0 mmHg   Bicarbonate 27.7 20.0 - 28.0 mmol/L   Acid-Base Excess 1.4 0.0 - 2.0 mmol/L   O2 Saturation 89.0 %   Patient temperature 37.0    Collection site LEFT RADIAL    Sample type ARTERIAL DRAW    Allens test (pass/fail) PASS PASS   Mechanical Rate 10     Comment: Performed at [facility], [place], [place] [address]  SARS CORONAVIRUS 2 (TAT 6-24 HRS)  Nasopharyngeal Nasopharyngeal Swab     Status: None   Collection Time: 01/23/21 12:43 AM   Specimen: Nasopharyngeal Swab  Result Value Ref Range   SARS Coronavirus 2 NEGATIVE NEGATIVE    Comment: (NOTE) SARS-CoV-2 target nucleic acids are NOT DETECTED.  The SARS-CoV-2 RNA is generally detectable in upper and lower respiratory specimens during the acute phase of infection. Negative results do not preclude SARS-CoV-2 infection, do not rule out co-infections with other pathogens, and should not be used as the sole basis for treatment or other patient management decisions. Negative results must be combined with clinical observations, patient history, and epidemiological information. The expected result is Negative.  Fact Sheet for Patients: SugarRoll.be  Fact Sheet for Healthcare Providers: https://www.woods-mathews.com/  This test is not yet approved or cleared by the [place] FDA and  has been authorized for detection and/or diagnosis of SARS-CoV-2 by FDA under an Emergency Use Authorization (EUA). This EUA will remain  in effect (meaning this test can be used) for the duration of the COVID-19 declaration under Se ction 564(b)(1) of the Act, 21 U.S.C. section 360bbb-3(b)(1), unless the authorization is terminated or revoked sooner.  Performed at [facility], [place] [address], [place], [place] [address]   Troponin I (High Sensitivity)     Status: Abnormal   Collection Time: 01/23/21  1:20 AM  Result Value Ref Range   Troponin I (High Sensitivity) 87 (H) <18 ng/L    Comment: READ BACK AND VERIFIED WITH  APRIL BRUMARD AT 0450 01/23/2021 DLB (NOTE) Elevated high sensitivity troponin I (hsTnI) values and significant  changes across serial measurements may suggest ACS but many other  chronic and acute conditions are known to elevate hsTnI results.  Refer to the "Links" section for chest pain algorithms and additional   guidance. Performed at [facility], [address] [place], [place] [address]   Blood gas, arterial     Status: Abnormal   Collection Time: 01/23/21  2:12 AM  Result Value Ref Range   FIO2 65.00    Mode BILEVEL POSITIVE AIRWAY PRESSURE    Inspiratory PAP 10    Expiratory PAP 5    pH, Arterial 7.39 7.350 - 7.450   pCO2 arterial 40 32.0 - 48.0 mmHg   pO2, Arterial 119 (H) 83.0 - 108.0 mmHg   Bicarbonate 24.2 20.0 - 28.0 mmol/L   Acid-base deficit 0.7 0.0 - 2.0 mmol/L   O2 Saturation 98.6 %   Patient temperature 37.0    Collection site LEFT RADIAL    Sample type ARTERIAL DRAW    Allens test (pass/fail) PASS PASS    Comment: Performed at [facility], [address] [place], [place] [address]  Culture, blood (routine x 2)     Status: None   Collection Time: 01/23/21  4:02 AM   Specimen: BLOOD  Result Value Ref Range   Specimen Description BLOOD LEFT AC    Special Requests      BOTTLES DRAWN AEROBIC AND ANAEROBIC Blood Culture adequate volume   Culture      NO GROWTH 8 DAYS Performed at [facility], [address] [place], [place] [address]    Report Status 01/31/2021 FINAL   Culture, blood (routine x 2)     Status: None   Collection Time: 01/23/21  4:13 AM   Specimen: BLOOD  Result Value Ref Range   Specimen Description BLOOD LEFT HAND    Special Requests      BOTTLES DRAWN AEROBIC AND ANAEROBIC Blood Culture adequate volume   Culture      NO GROWTH 8 DAYS Performed at [facility], [address] [place], [place] [address]    Report Status 01/31/2021 FINAL   Lactic acid, plasma     Status: Abnormal   Collection Time: 01/23/21  4:13 AM  Result Value Ref Range   Lactic Acid, Venous 3.0 (HH) 0.5 - 1.9 mmol/L    Comment: CRITICAL RESULT CALLED TO, READ BACK BY AND VERIFIED WITH Hazle Quant AT 0051 01/23/2021 DLB Performed at [facility], [place], [place] [address]   Procalcitonin - Baseline      Status: None   Collection Time: 01/23/21  4:13 AM  Result Value Ref Range   Procalcitonin <0.10 ng/mL    Comment:        Interpretation: PCT (Procalcitonin) <= 0.5 ng/mL: Systemic infection (sepsis) is not likely. Local bacterial infection is possible. (NOTE)       Sepsis PCT Algorithm           Lower Respiratory Tract                                      Infection PCT Algorithm    ----------------------------     ----------------------------         PCT < 0.25 ng/mL  PCT < 0.10 ng/mL          Strongly encourage             Strongly discourage   discontinuation of antibiotics    initiation of antibiotics    ----------------------------     -----------------------------       PCT 0.25 - 0.50 ng/mL            PCT 0.10 - 0.25 ng/mL               OR       >80% decrease in PCT            Discourage initiation of                                            antibiotics      Encourage discontinuation           of antibiotics    ----------------------------     -----------------------------         PCT >= 0.50 ng/mL              PCT 0.26 - 0.50 ng/mL               AND        <80% decrease in PCT             Encourage initiation of                                             antibiotics       Encourage continuation           of antibiotics    ----------------------------     -----------------------------        PCT >= 0.50 ng/mL                  PCT > 0.50 ng/mL               AND         increase in PCT                  Strongly encourage                                      initiation of antibiotics    Strongly encourage escalation           of antibiotics                                     -----------------------------                                           PCT <= 0.25 ng/mL                                                 OR                                        >  80% decrease in PCT                                      Discontinue / Do not initiate                                              antibiotics  Performed at [facility], [address] [place], [place] [address]   Comprehensive metabolic panel     Status: Abnormal   Collection Time: 01/23/21  4:13 AM  Result Value Ref Range   Sodium 134 (L) 135 - 145 mmol/L   Potassium 4.3 3.5 - 5.1 mmol/L   Chloride 96 (L) 98 - 111 mmol/L   CO2 28 22 - 32 mmol/L   Glucose, Bld 284 (H) 70 - 99 mg/dL    Comment: Glucose reference range applies only to samples taken after fasting for at least 8 hours.   BUN 21 8 - 23 mg/dL   Creatinine, Ser 0.88 0.44 - 1.00 mg/dL   Calcium 9.0 8.9 - 10.3 mg/dL   Total Protein 7.1 6.5 - 8.1 g/dL   Albumin 4.0 3.5 - 5.0 g/dL   AST 42 (H) 15 - 41 U/L   ALT 34 0 - 44 U/L   Alkaline Phosphatase 81 38 - 126 U/L   Total Bilirubin 1.6 (H) 0.3 - 1.2 mg/dL   GFR, Estimated >60 >60 mL/min    Comment: (NOTE) Calculated using the CKD-EPI Creatinine Equation (2021)    Anion gap 10 5 - 15    Comment: Performed at [facility], [address] [place], [place] [address]  Lactic acid, plasma     Status: Abnormal   Collection Time: 01/23/21  6:00 AM  Result Value Ref Range   Lactic Acid, Venous 3.0 (HH) 0.5 - 1.9 mmol/L    Comment: CRITICAL VALUE NOTED. VALUE IS CONSISTENT WITH PREVIOUSLY REPORTED/CALLED VALUE DLB Performed at [facility], [address] [place], [place] [address]   CBG monitoring, ED     Status: Abnormal   Collection Time: 01/23/21  7:52 AM  Result Value Ref Range   Glucose-Capillary 272 (H) 70 - 99 mg/dL    Comment: Glucose reference range applies only to samples taken after fasting for at least 8 hours.  ECHOCARDIOGRAM COMPLETE     Status: None   Collection Time: 01/23/21  9:49 AM  Result Value Ref Range   BP 131/71 mmHg   S' Lateral 2.89 cm  CBG monitoring, ED     Status: Abnormal   Collection Time: 01/23/21 11:05 AM  Result Value Ref Range   Glucose-Capillary 198 (H) 70 - 99 mg/dL    Comment: Glucose reference  range applies only to samples taken after fasting for at least 8 hours.  CBG monitoring, ED     Status: Abnormal   Collection Time: 01/23/21  5:01 PM  Result Value Ref Range   Glucose-Capillary 263 (H) 70 - 99 mg/dL    Comment: Glucose reference range applies only to samples taken after fasting for at least 8 hours.  Troponin I (High Sensitivity)     Status: Abnormal   Collection Time: 01/23/21  5:22 PM  Result Value Ref Range   Troponin I (High Sensitivity) 692 (HH) <18 ng/L    Comment:  CRITICAL RESULT CALLED TO, READ BACK BY AND VERIFIED WITH ASHLEY ELLWANGER $[id number]$  ON 01/23/21 SKL (NOTE) Elevated high sensitivity troponin I (hsTnI) values and significant  changes across serial measurements may suggest ACS but many other  chronic and acute conditions are known to elevate hsTnI results.  Refer to the "Links" section for chest pain algorithms and additional  guidance. Performed at [facility], [address] [place], [place] [address]   CBG monitoring, ED     Status: Abnormal   Collection Time: 01/23/21  9:46 PM  Result Value Ref Range   Glucose-Capillary 180 (H) 70 - 99 mg/dL    Comment: Glucose reference range applies only to samples taken after fasting for at least 8 hours.   Comment 1 Notify RN   Troponin I (High Sensitivity)     Status: Abnormal   Collection Time: 01/24/21 12:32 AM  Result Value Ref Range   Troponin I (High Sensitivity) 481 (HH) <18 ng/L    Comment: CRITICAL VALUE NOTED. VALUE IS CONSISTENT WITH PREVIOUSLY REPORTED/CALLED VALUE SKL (NOTE) Elevated high sensitivity troponin I (hsTnI) values and significant  changes across serial measurements may suggest ACS but many other  chronic and acute conditions are known to elevate hsTnI results.  Refer to the "Links" section for chest pain algorithms and additional  guidance. Performed at [facility], [address] [place], [place] [address]   Basic metabolic panel     Status: Abnormal    Collection Time: 01/24/21  4:19 AM  Result Value Ref Range   Sodium 132 (L) 135 - 145 mmol/L   Potassium 3.8 3.5 - 5.1 mmol/L   Chloride 95 (L) 98 - 111 mmol/L   CO2 30 22 - 32 mmol/L   Glucose, Bld 158 (H) 70 - 99 mg/dL    Comment: Glucose reference range applies only to samples taken after fasting for at least 8 hours.   BUN 21 8 - 23 mg/dL   Creatinine, Ser 0.79 0.44 - 1.00 mg/dL   Calcium 7.8 (L) 8.9 - 10.3 mg/dL   GFR, Estimated >60 >60 mL/min    Comment: (NOTE) Calculated using the CKD-EPI Creatinine Equation (2021)    Anion gap 7 5 - 15    Comment: Performed at [facility], [address] [place], [place] [address]  CBC     Status: Abnormal   Collection Time: 01/24/21  4:19 AM  Result Value Ref Range   WBC 15.8 (H) 4.0 - 10.5 K/uL   RBC 3.81 (L) 3.87 - 5.11 MIL/uL   Hemoglobin 12.5 12.0 - 15.0 g/dL   HCT 36.3 36.0 - 46.0 %   MCV 95.3 80.0 - 100.0 fL   MCH 32.8 26.0 - 34.0 pg   MCHC 34.4 30.0 - 36.0 g/dL   RDW 13.3 11.5 - 15.5 %   Platelets 185 150 - 400 K/uL   nRBC 0.0 0.0 - 0.2 %    Comment: Performed at [facility], [address] [place], [place] [address]  Procalcitonin     Status: None   Collection Time: 01/24/21  4:19 AM  Result Value Ref Range   Procalcitonin 0.21 ng/mL    Comment:        Interpretation: PCT (Procalcitonin) <= 0.5 ng/mL: Systemic infection (sepsis) is not likely. Local bacterial infection is possible. (NOTE)       Sepsis PCT Algorithm           Lower Respiratory Tract  Infection PCT Algorithm    ----------------------------     ----------------------------         PCT < 0.25 ng/mL                PCT < 0.10 ng/mL          Strongly encourage             Strongly discourage   discontinuation of antibiotics    initiation of antibiotics    ----------------------------     -----------------------------       PCT 0.25 - 0.50 ng/mL            PCT 0.10 - 0.25 ng/mL               OR        >80% decrease in PCT            Discourage initiation of                                            antibiotics      Encourage discontinuation           of antibiotics    ----------------------------     -----------------------------         PCT >= 0.50 ng/mL              PCT 0.26 - 0.50 ng/mL               AND        <80% decrease in PCT             Encourage initiation of                                             antibiotics       Encourage continuation           of antibiotics    ----------------------------     -----------------------------        PCT >= 0.50 ng/mL                  PCT > 0.50 ng/mL               AND         increase in PCT                  Strongly encourage                                      initiation of antibiotics    Strongly encourage escalation           of antibiotics                                     -----------------------------                                           PCT <= 0.25 ng/mL  OR                                        > 80% decrease in PCT                                      Discontinue / Do not initiate                                             antibiotics  Performed at [facility], [address] [place], [place] [address]   APTT     Status: Abnormal   Collection Time: 01/24/21  4:19 AM  Result Value Ref Range   aPTT 38 (H) 24 - 36 seconds    Comment:        IF BASELINE aPTT IS ELEVATED, SUGGEST PATIENT RISK ASSESSMENT BE USED TO DETERMINE APPROPRIATE ANTICOAGULANT THERAPY. Performed at [facility], [place], [place] [address]   Heparin level (unfractionated)     Status: Abnormal   Collection Time: 01/24/21  4:19 AM  Result Value Ref Range   Heparin Unfractionated >1.10 (H) 0.30 - 0.70 IU/mL    Comment: (NOTE) The clinical reportable range upper limit is being lowered to >1.10 to align with the FDA approved guidance for the current  laboratory assay.  If heparin results are below expected values, and patient dosage has  been confirmed, suggest follow up testing of antithrombin III levels. Performed at [facility], [address] [place], [place] [address]   Glucose, capillary     Status: Abnormal   Collection Time: 01/24/21  7:54 AM  Result Value Ref Range   Glucose-Capillary 150 (H) 70 - 99 mg/dL    Comment: Glucose reference range applies only to samples taken after fasting for at least 8 hours.  Glucose, capillary     Status: Abnormal   Collection Time: 01/24/21 11:51 AM  Result Value Ref Range   Glucose-Capillary 142 (H) 70 - 99 mg/dL    Comment: Glucose reference range applies only to samples taken after fasting for at least 8 hours.  APTT     Status: Abnormal   Collection Time: 01/24/21 12:47 PM  Result Value Ref Range   aPTT 149 (H) 24 - 36 seconds    Comment:        IF BASELINE aPTT IS ELEVATED, SUGGEST PATIENT RISK ASSESSMENT BE USED TO DETERMINE APPROPRIATE ANTICOAGULANT THERAPY. Performed at [facility], [address] [place],  [address]   Glucose, capillary     Status: Abnormal   Collection Time: 01/24/21  6:13 PM  Result Value Ref Range   Glucose-Capillary 172 (H) 70 - 99 mg/dL    Comment: Glucose reference range applies only to samples taken after fasting for at least 8 hours.  Glucose, capillary     Status: Abnormal   Collection Time: 01/24/21  7:57 PM  Result Value Ref Range   Glucose-Capillary 191 (H) 70 - 99 mg/dL    Comment: Glucose reference range applies only to samples taken after fasting for at least 8 hours.  APTT     Status: Abnormal   Collection Time: 01/24/21 10:09 PM  Result Value  Ref Range   aPTT 72 (H) 24 - 36 seconds    Comment:        IF BASELINE aPTT IS ELEVATED, SUGGEST PATIENT RISK ASSESSMENT BE USED TO DETERMINE APPROPRIATE ANTICOAGULANT THERAPY. Performed at [facility], [place], [place] [address]    Heparin level (unfractionated)     Status: Abnormal   Collection Time: 01/25/21  4:50 AM  Result Value Ref Range   Heparin Unfractionated >1.10 (H) 0.30 - 0.70 IU/mL    Comment: (NOTE) The clinical reportable range upper limit is being lowered to >1.10 to align with the FDA approved guidance for the current laboratory assay.  If heparin results are below expected values, and patient dosage has  been confirmed, suggest follow up testing of antithrombin III levels. Performed at [facility], [address] [place], [place] [address]   CBC     Status: Abnormal   Collection Time: 01/25/21  4:50 AM  Result Value Ref Range   WBC 11.0 (H) 4.0 - 10.5 K/uL   RBC 4.00 3.87 - 5.11 MIL/uL   Hemoglobin 12.9 12.0 - 15.0 g/dL   HCT 37.8 36.0 - 46.0 %   MCV 94.5 80.0 - 100.0 fL   MCH 32.3 26.0 - 34.0 pg   MCHC 34.1 30.0 - 36.0 g/dL   RDW 13.0 11.5 - 15.5 %   Platelets 188 150 - 400 K/uL   nRBC 0.0 0.0 - 0.2 %    Comment: Performed at [facility], [address], [place], [place]  [address]  Basic metabolic panel     Status: Abnormal   Collection Time: 01/25/21  4:50 AM  Result Value Ref Range   Sodium 133 (L) 135 - 145 mmol/L   Potassium 3.3 (L) 3.5 - 5.1 mmol/L   Chloride 93 (L) 98 - 111 mmol/L   CO2 32 22 - 32 mmol/L   Glucose, Bld 139 (H) 70 - 99 mg/dL    Comment: Glucose reference range applies only to samples taken after fasting for at least 8 hours.   BUN 17 8 - 23 mg/dL   Creatinine, Ser 0.69 0.44 - 1.00 mg/dL   Calcium 7.7 (L) 8.9 - 10.3 mg/dL   GFR, Estimated >60 >60 mL/min    Comment: (NOTE) Calculated using the CKD-EPI Creatinine Equation (2021)    Anion gap 8 5 - 15    Comment: Performed at [facility], [address] [place], [place] [address]  APTT     Status: Abnormal   Collection Time: 01/25/21  4:50 AM  Result Value Ref Range   aPTT 75 (H) 24 - 36 seconds    Comment:        IF BASELINE aPTT IS ELEVATED, SUGGEST PATIENT RISK  ASSESSMENT BE USED TO DETERMINE APPROPRIATE ANTICOAGULANT THERAPY. Performed at [facility], [address] [place], [place] [address]   Glucose, capillary     Status: Abnormal   Collection Time: 01/25/21  7:48 AM  Result Value Ref Range   Glucose-Capillary 129 (H) 70 - 99 mg/dL    Comment: Glucose reference range applies only to samples taken after fasting for at least 8 hours.  Glucose, capillary     Status: Abnormal   Collection Time: 01/25/21 12:06 PM  Result Value Ref Range   Glucose-Capillary 134 (H) 70 - 99 mg/dL    Comment: Glucose reference range applies only to samples taken after fasting for at least 8 hours.  Glucose, capillary  Status: Abnormal   Collection Time: 01/25/21  5:18 PM  Result Value Ref Range   Glucose-Capillary 202 (H) 70 - 99 mg/dL    Comment: Glucose reference range applies only to samples taken after fasting for at least 8 hours.  Glucose, capillary     Status: Abnormal   Collection Time: 01/25/21  8:39 PM  Result Value Ref Range   Glucose-Capillary 204 (H) 70 - 99 mg/dL    Comment: Glucose reference range applies only to samples taken after fasting for at least 8 hours.  APTT     Status: Abnormal   Collection Time: 01/26/21  1:12 AM  Result Value Ref Range   aPTT 68 (H) 24 - 36 seconds    Comment:        IF BASELINE aPTT IS ELEVATED, SUGGEST PATIENT RISK ASSESSMENT BE USED TO DETERMINE APPROPRIATE ANTICOAGULANT THERAPY. Performed at [facility], [address] [place], [place] [address]   CBC     Status: Abnormal   Collection Time: 01/26/21  1:12 AM  Result Value Ref Range   WBC 11.2 (H) 4.0 - 10.5 K/uL   RBC 3.99 3.87 - 5.11 MIL/uL   Hemoglobin 12.9 12.0 - 15.0 g/dL   HCT 37.7 36.0 - 46.0 %   MCV 94.5 80.0 - 100.0 fL   MCH 32.3 26.0 - 34.0 pg   MCHC 34.2 30.0 - 36.0 g/dL   RDW 13.0 11.5 - 15.5 %   Platelets 189 150 - 400 K/uL   nRBC 0.0 0.0 - 0.2 %    Comment: Performed at [facility], [address], [place], [place] [address]  Basic metabolic panel     Status: Abnormal   Collection Time: 01/26/21  1:12 AM  Result Value Ref Range   Sodium 134 (L) 135 - 145 mmol/L   Potassium 3.4 (L) 3.5 - 5.1 mmol/L   Chloride 94 (L) 98 - 111 mmol/L   CO2 36 (H) 22 - 32 mmol/L   Glucose, Bld 199 (H) 70 - 99 mg/dL    Comment: Glucose reference range applies only to samples taken after fasting for at least 8 hours.   BUN 19 8 - 23 mg/dL   Creatinine, Ser 0.81 0.44 - 1.00 mg/dL   Calcium 7.8 (L) 8.9 - 10.3 mg/dL   GFR, Estimated >60 >60 mL/min    Comment: (NOTE) Calculated using the CKD-EPI Creatinine Equation (2021)    Anion gap 4 (L) 5 - 15    Comment: Performed at [facility], [address] [place], [place] [address]  Glucose, capillary     Status: Abnormal   Collection Time: 01/26/21  8:04 AM  Result Value Ref Range   Glucose-Capillary 172 (H) 70 - 99 mg/dL    Comment: Glucose reference range applies only to samples taken after fasting for at least 8 hours.  Heparin level (unfractionated)     Status: Abnormal   Collection Time: 01/26/21  9:52 AM  Result Value Ref Range   Heparin Unfractionated 0.72 (H) 0.30 - 0.70 IU/mL    Comment: (NOTE) The clinical reportable range upper limit is being lowered to >1.10 to align with the FDA approved guidance for the current laboratory assay.  If heparin results are below expected values, and patient dosage has  been confirmed, suggest follow up testing of antithrombin III levels. Performed at [facility], [address] [place],  [address]   APTT     Status: Abnormal   Collection Time:  01/26/21  9:52 AM  Result Value Ref Range   aPTT 58 (H) 24 - 36 seconds    Comment:        IF BASELINE aPTT IS ELEVATED, SUGGEST PATIENT RISK ASSESSMENT BE USED TO DETERMINE APPROPRIATE ANTICOAGULANT THERAPY. Performed at [facility], [address] [place], [place] [address]   Glucose, capillary     Status:  Abnormal   Collection Time: 01/26/21 11:36 AM  Result Value Ref Range   Glucose-Capillary 208 (H) 70 - 99 mg/dL    Comment: Glucose reference range applies only to samples taken after fasting for at least 8 hours.  Glucose, capillary     Status: Abnormal   Collection Time: 01/26/21  4:09 PM  Result Value Ref Range   Glucose-Capillary 182 (H) 70 - 99 mg/dL    Comment: Glucose reference range applies only to samples taken after fasting for at least 8 hours.  APTT     Status: Abnormal   Collection Time: 01/26/21  6:54 PM  Result Value Ref Range   aPTT 62 (H) 24 - 36 seconds    Comment:        IF BASELINE aPTT IS ELEVATED, SUGGEST PATIENT RISK ASSESSMENT BE USED TO DETERMINE APPROPRIATE ANTICOAGULANT THERAPY. Performed at [facility], [address] [place], [place] [address]   Glucose, capillary     Status: Abnormal   Collection Time: 01/26/21  9:10 PM  Result Value Ref Range   Glucose-Capillary 266 (H) 70 - 99 mg/dL    Comment: Glucose reference range applies only to samples taken after fasting for at least 8 hours.  Basic metabolic panel     Status: Abnormal   Collection Time: 01/27/21  4:39 AM  Result Value Ref Range   Sodium 135 135 - 145 mmol/L   Potassium 3.3 (L) 3.5 - 5.1 mmol/L   Chloride 92 (L) 98 - 111 mmol/L   CO2 33 (H) 22 - 32 mmol/L   Glucose, Bld 204 (H) 70 - 99 mg/dL    Comment: Glucose reference range applies only to samples taken after fasting for at least 8 hours.   BUN 19 8 - 23 mg/dL   Creatinine, Ser 0.71 0.44 - 1.00 mg/dL   Calcium 8.0 (L) 8.9 - 10.3 mg/dL   GFR, Estimated >60 >60 mL/min    Comment: (NOTE) Calculated using the CKD-EPI Creatinine Equation (2021)    Anion gap 10 5 - 15    Comment: Performed at [facility], [address] [place], [place] [address]  CBC     Status: None   Collection Time: 01/27/21  4:39 AM  Result Value Ref Range   WBC 10.0 4.0 - 10.5 K/uL   RBC 4.14 3.87 - 5.11 MIL/uL   Hemoglobin 13.9 12.0 -  15.0 g/dL   HCT 39.5 36.0 - 46.0 %   MCV 95.4 80.0 - 100.0 fL   MCH 33.6 26.0 - 34.0 pg   MCHC 35.2 30.0 - 36.0 g/dL   RDW 12.9 11.5 - 15.5 %   Platelets 202 150 - 400 K/uL   nRBC 0.0 0.0 - 0.2 %    Comment: Performed at [facility], [address] [place], [place] [address]  Glucose, capillary     Status: Abnormal   Collection Time: 01/27/21  7:37 AM  Result Value Ref Range   Glucose-Capillary 188 (H) 70 - 99 mg/dL    Comment: Glucose reference range applies only to samples taken after fasting for at  least 8 hours.  Glucose, capillary     Status: Abnormal   Collection Time: 01/27/21 12:26 PM  Result Value Ref Range   Glucose-Capillary 190 (H) 70 - 99 mg/dL    Comment: Glucose reference range applies only to samples taken after fasting for at least 8 hours.  Glucose, capillary     Status: Abnormal   Collection Time: 01/27/21  4:53 PM  Result Value Ref Range   Glucose-Capillary 184 (H) 70 - 99 mg/dL    Comment: Glucose reference range applies only to samples taken after fasting for at least 8 hours.  Glucose, capillary     Status: Abnormal   Collection Time: 01/27/21 10:47 PM  Result Value Ref Range   Glucose-Capillary 211 (H) 70 - 99 mg/dL    Comment: Glucose reference range applies only to samples taken after fasting for at least 8 hours.  Basic metabolic panel     Status: Abnormal   Collection Time: 01/28/21  5:36 AM  Result Value Ref Range   Sodium 134 (L) 135 - 145 mmol/L   Potassium 3.4 (L) 3.5 - 5.1 mmol/L   Chloride 94 (L) 98 - 111 mmol/L   CO2 32 22 - 32 mmol/L   Glucose, Bld 186 (H) 70 - 99 mg/dL    Comment: Glucose reference range applies only to samples taken after fasting for at least 8 hours.   BUN 19 8 - 23 mg/dL   Creatinine, Ser 0.64 0.44 - 1.00 mg/dL   Calcium 7.8 (L) 8.9 - 10.3 mg/dL   GFR, Estimated >60 >60 mL/min    Comment: (NOTE) Calculated using the CKD-EPI Creatinine Equation (2021)    Anion gap 8 5 - 15    Comment: Performed at  [facility], [address] [place], [place] [address]  Magnesium     Status: None   Collection Time: 01/28/21  5:36 AM  Result Value Ref Range   Magnesium 2.1 1.7 - 2.4 mg/dL    Comment: Performed at [facility], [address] [place], [place] [address]  Glucose, capillary     Status: Abnormal   Collection Time: 01/28/21  8:09 AM  Result Value Ref Range   Glucose-Capillary 190 (H) 70 - 99 mg/dL    Comment: Glucose reference range applies only to samples taken after fasting for at least 8 hours.  Glucose, capillary     Status: Abnormal   Collection Time: 01/28/21 11:34 AM  Result Value Ref Range   Glucose-Capillary 366 (H) 70 - 99 mg/dL    Comment: Glucose reference range applies only to samples taken after fasting for at least 8 hours.  Glucose, capillary     Status: Abnormal   Collection Time: 01/28/21  4:16 PM  Result Value Ref Range   Glucose-Capillary 149 (H) 70 - 99 mg/dL    Comment: Glucose reference range applies only to samples taken after fasting for at least 8 hours.  Glucose, capillary     Status: Abnormal   Collection Time: 01/28/21 10:14 PM  Result Value Ref Range   Glucose-Capillary 332 (H) 70 - 99 mg/dL    Comment: Glucose reference range applies only to samples taken after fasting for at least 8 hours.  Basic metabolic panel     Status: Abnormal   Collection Time: 01/29/21  6:59 AM  Result Value Ref Range   Sodium 136 135 - 145 mmol/L   Potassium 3.5 3.5 - 5.1 mmol/L   Chloride 95 (L) 98 - 111 mmol/L  CO2 30 22 - 32 mmol/L   Glucose, Bld 167 (H) 70 - 99 mg/dL    Comment: Glucose reference range applies only to samples taken after fasting for at least 8 hours.   BUN 18 8 - 23 mg/dL   Creatinine, Ser 0.65 0.44 - 1.00 mg/dL   Calcium 8.2 (L) 8.9 - 10.3 mg/dL   GFR, Estimated >60 >60 mL/min    Comment: (NOTE) Calculated using the CKD-EPI Creatinine Equation (2021)    Anion gap 11 5 - 15    Comment: Performed at [facility],  [address] [place], [place] [address]  Magnesium     Status: None   Collection Time: 01/29/21  6:59 AM  Result Value Ref Range   Magnesium 2.0 1.7 - 2.4 mg/dL    Comment: Performed at [facility], [address] [place], [place] [address]  Glucose, capillary     Status: Abnormal   Collection Time: 01/29/21  8:03 AM  Result Value Ref Range   Glucose-Capillary 188 (H) 70 - 99 mg/dL    Comment: Glucose reference range applies only to samples taken after fasting for at least 8 hours.  Glucose, capillary     Status: Abnormal   Collection Time: 01/29/21 11:38 AM  Result Value Ref Range   Glucose-Capillary 224 (H) 70 - 99 mg/dL    Comment: Glucose reference range applies only to samples taken after fasting for at least 8 hours.  Glucose, capillary     Status: Abnormal   Collection Time: 01/29/21  4:18 PM  Result Value Ref Range   Glucose-Capillary 207 (H) 70 - 99 mg/dL    Comment: Glucose reference range applies only to samples taken after fasting for at least 8 hours.  Glucose, capillary     Status: Abnormal   Collection Time: 01/29/21  8:11 PM  Result Value Ref Range   Glucose-Capillary 216 (H) 70 - 99 mg/dL    Comment: Glucose reference range applies only to samples taken after fasting for at least 8 hours.  Basic metabolic panel     Status: Abnormal   Collection Time: 01/30/21  6:24 AM  Result Value Ref Range   Sodium 134 (L) 135 - 145 mmol/L   Potassium 4.3 3.5 - 5.1 mmol/L   Chloride 96 (L) 98 - 111 mmol/L   CO2 28 22 - 32 mmol/L   Glucose, Bld 173 (H) 70 - 99 mg/dL    Comment: Glucose reference range applies only to samples taken after fasting for at least 8 hours.   BUN 25 (H) 8 - 23 mg/dL   Creatinine, Ser 0.69 0.44 - 1.00 mg/dL   Calcium 8.1 (L) 8.9 - 10.3 mg/dL   GFR, Estimated >60 >60 mL/min    Comment: (NOTE) Calculated using the CKD-EPI Creatinine Equation (2021)    Anion gap 10 5 - 15    Comment: Performed at [facility], [address] [place], [place] [address]  CBC     Status: None   Collection Time: 01/30/21  6:24 AM  Result Value Ref Range   WBC 9.4 4.0 - 10.5 K/uL   RBC 4.39 3.87 - 5.11 MIL/uL   Hemoglobin 14.2 12.0 - 15.0 g/dL   HCT 41.1 36.0 - 46.0 %   MCV 93.6 80.0 - 100.0 fL   MCH 32.3 26.0 - 34.0 pg   MCHC 34.5 30.0 - 36.0 g/dL   RDW 12.6 11.5 - 15.5 %   Platelets 275 150 - 400  K/uL   nRBC 0.2 0.0 - 0.2 %    Comment: Performed at [facility], [address] [place], [place] [address]  Magnesium     Status: None   Collection Time: 01/30/21  6:24 AM  Result Value Ref Range   Magnesium 2.0 1.7 - 2.4 mg/dL    Comment: Performed at [facility], [address] [place], [place] [address]  Glucose, capillary     Status: Abnormal   Collection Time: 01/30/21  6:24 AM  Result Value Ref Range   Glucose-Capillary 161 (H) 70 - 99 mg/dL    Comment: Glucose reference range applies only to samples taken after fasting for at least 8 hours.  Glucose, capillary     Status: Abnormal   Collection Time: 01/30/21 12:39 PM  Result Value Ref Range   Glucose-Capillary 272 (H) 70 - 99 mg/dL    Comment: Glucose reference range applies only to samples taken after fasting for at least 8 hours.  Glucose, capillary     Status: Abnormal   Collection Time: 01/30/21  3:49 PM  Result Value Ref Range   Glucose-Capillary 182 (H) 70 - 99 mg/dL    Comment: Glucose reference range applies only to samples taken after fasting for at least 8 hours.  Glucose, capillary     Status: Abnormal   Collection Time: 01/30/21 10:12 PM  Result Value Ref Range   Glucose-Capillary 217 (H) 70 - 99 mg/dL    Comment: Glucose reference range applies only to samples taken after fasting for at least 8 hours.  Magnesium     Status: None   Collection Time: 01/31/21  7:03 AM  Result Value Ref Range   Magnesium 2.0 1.7 - 2.4 mg/dL    Comment: Performed at [facility], [address] [place], [place] [address]  CBC      Status: None   Collection Time: 01/31/21  7:03 AM  Result Value Ref Range   WBC 8.4 4.0 - 10.5 K/uL   RBC 4.34 3.87 - 5.11 MIL/uL   Hemoglobin 13.9 12.0 - 15.0 g/dL   HCT 41.1 36.0 - 46.0 %   MCV 94.7 80.0 - 100.0 fL   MCH 32.0 26.0 - 34.0 pg   MCHC 33.8 30.0 - 36.0 g/dL   RDW 12.6 11.5 - 15.5 %   Platelets 285 150 - 400 K/uL   nRBC 0.0 0.0 - 0.2 %    Comment: Performed at [facility], [address], [place], [place] [address]  Basic metabolic panel     Status: Abnormal   Collection Time: 01/31/21  7:03 AM  Result Value Ref Range   Sodium 133 (L) 135 - 145 mmol/L   Potassium 5.0 3.5 - 5.1 mmol/L   Chloride 98 98 - 111 mmol/L   CO2 26 22 - 32 mmol/L   Glucose, Bld 142 (H) 70 - 99 mg/dL    Comment: Glucose reference range applies only to samples taken after fasting for at least 8 hours.   BUN 26 (H) 8 - 23 mg/dL   Creatinine, Ser 0.90 0.44 - 1.00 mg/dL   Calcium 8.0 (L) 8.9 - 10.3 mg/dL   GFR, Estimated >60 >60 mL/min    Comment: (NOTE) Calculated using the CKD-EPI Creatinine Equation (2021)    Anion gap 9 5 - 15    Comment: Performed at [facility], [address] [place], [place] [address]  Glucose, capillary     Status: Abnormal   Collection Time:  01/31/21  8:10 AM  Result Value Ref Range   Glucose-Capillary 183 (H) 70 - 99 mg/dL    Comment: Glucose reference range applies only to samples taken after fasting for at least 8 hours.   Comment 1 Notify RN    Comment 2 Document in Chart   Glucose, capillary     Status: Abnormal   Collection Time: 01/31/21 12:41 PM  Result Value Ref Range   Glucose-Capillary 242 (H) 70 - 99 mg/dL    Comment: Glucose reference range applies only to samples taken after fasting for at least 8 hours.   Comment 1 Notify RN    Comment 2 Document in Chart   Basic Metabolic Panel (BMET)     Status: Abnormal   Collection Time: 02/07/21  8:34 AM  Result Value Ref Range   Sodium 132 (L) 135 - 145 mmol/L   Potassium 4.3 3.5 -  5.1 mmol/L   Chloride 93 (L) 98 - 111 mmol/L   CO2 30 22 - 32 mmol/L   Glucose, Bld 139 (H) 70 - 99 mg/dL    Comment: Glucose reference range applies only to samples taken after fasting for at least 8 hours.   BUN 14 8 - 23 mg/dL   Creatinine, Ser 0.94 0.44 - 1.00 mg/dL   Calcium 8.9 8.9 - 10.3 mg/dL   GFR, Estimated >60 >60 mL/min    Comment: (NOTE) Calculated using the CKD-EPI Creatinine Equation (2021)    Anion gap 9 5 - 15    Comment: Performed at [facility], [address] [place], [place] [address]     PHQ2/9: Depression screen [facility] 2/9 02/09/2021 02/02/2021 01/09/2021 07/12/2020 04/19/2020  Decreased Interest 0 0 0 0 0  Down, Depressed, Hopeless 0 0 0 0 0  PHQ - 2 Score 0 0 0 0 0  Altered sleeping - - - - -  Tired, decreased energy - - - - -  Change in appetite - - - - -  Feeling bad or failure about yourself  - - - - -  Trouble concentrating - - - - -  Moving slowly or fidgety/restless - - - - -  Suicidal thoughts - - - - -  PHQ-9 Score - - - - -  Difficult doing work/chores - - - - -  Some recent data might be hidden    phq 9 is negative   Fall Risk: Fall Risk  02/09/2021 02/02/2021 01/09/2021 07/12/2020 04/19/2020  Falls in the past year? 0 0 0 0 0  Comment - - - - -  Number falls in past yr: 0 0 0 0 0  Injury with Fall? 0 0 0 0 0  Risk for fall due to : - Impaired balance/gait - - No Fall Risks  Risk for fall due to: Comment - - - - -  Follow up - Falls evaluation completed;Falls prevention discussed - - Falls prevention discussed     Functional Status Survey: Is the patient deaf or have difficulty hearing?: Yes Does the patient have difficulty seeing, even when wearing glasses/contacts?: No Does the patient have difficulty concentrating, remembering, or making decisions?: No Does the patient have difficulty walking or climbing stairs?: Yes Does the patient have difficulty dressing or bathing?: No Does the patient have difficulty doing errands alone  such as visiting a doctor's office or shopping?: Yes    Assessment & Plan  1. Hospital discharge follow-up  Keep follow up with Cardiologist  2. Chronic combined systolic and diastolic  congestive heart failure ([place])  Doing well  3. History of cardioversion  Still in regular rhythm   4. AF (paroxysmal atrial fibrillation) (HCC)   Continue Eliquis

## 2021-02-10 ENCOUNTER — Ambulatory Visit (INDEPENDENT_AMBULATORY_CARE_PROVIDER_SITE_OTHER): Payer: Medicare Other | Admitting: Physician Assistant

## 2021-02-10 ENCOUNTER — Encounter: Payer: Self-pay | Admitting: Physician Assistant

## 2021-02-10 VITALS — BP 170/80 | HR 70 | Ht 63.0 in | Wt 184.0 lb

## 2021-02-10 DIAGNOSIS — E785 Hyperlipidemia, unspecified: Secondary | ICD-10-CM

## 2021-02-10 DIAGNOSIS — I5043 Acute on chronic combined systolic (congestive) and diastolic (congestive) heart failure: Secondary | ICD-10-CM | POA: Diagnosis not present

## 2021-02-10 DIAGNOSIS — I5042 Chronic combined systolic (congestive) and diastolic (congestive) heart failure: Secondary | ICD-10-CM | POA: Diagnosis not present

## 2021-02-10 DIAGNOSIS — I4819 Other persistent atrial fibrillation: Secondary | ICD-10-CM

## 2021-02-10 DIAGNOSIS — I10 Essential (primary) hypertension: Secondary | ICD-10-CM

## 2021-02-10 DIAGNOSIS — I2510 Atherosclerotic heart disease of native coronary artery without angina pectoris: Secondary | ICD-10-CM

## 2021-02-10 DIAGNOSIS — I701 Atherosclerosis of renal artery: Secondary | ICD-10-CM | POA: Diagnosis not present

## 2021-02-10 DIAGNOSIS — J9601 Acute respiratory failure with hypoxia: Secondary | ICD-10-CM | POA: Diagnosis not present

## 2021-02-10 DIAGNOSIS — I480 Paroxysmal atrial fibrillation: Secondary | ICD-10-CM | POA: Diagnosis not present

## 2021-02-10 DIAGNOSIS — I428 Other cardiomyopathies: Secondary | ICD-10-CM

## 2021-02-10 DIAGNOSIS — I110 Hypertensive heart disease with heart failure: Secondary | ICD-10-CM | POA: Diagnosis not present

## 2021-02-10 MED ORDER — LOSARTAN POTASSIUM 25 MG PO TABS: 25.0000 mg | ORAL_TABLET | Freq: Every day | ORAL | 3 refills | Status: DC

## 2021-02-10 MED ORDER — AMIODARONE HCL 200 MG PO TABS: 200.0000 mg | ORAL_TABLET | Freq: Every day | ORAL | 3 refills | Status: DC

## 2021-02-10 MED ORDER — CARVEDILOL 3.125 MG PO TABS: 3.1250 mg | ORAL_TABLET | Freq: Two times a day (BID) | ORAL | 3 refills | Status: DC

## 2021-02-10 NOTE — Patient Instructions (Signed)
Visit Information It was great speaking with you today!  Please let me know if you have any questions about our visit.   Goals Addressed             This Visit's Progress    Monitor and Manage My Blood Sugar-Diabetes Type 2       Timeframe:  Long-Range Goal Priority:  High Start Date: 02/09/2021                             Expected End Date:  02/09/2022                     Follow Up Date 05/17/2021    - check blood sugar at prescribed times - check blood sugar before and after exercise - check blood sugar if I feel it is too high or too low - take the blood sugar meter to all doctor visits    Why is this important?   Checking your blood sugar at home helps to keep it from getting very high or very low.  Writing the results in a diary or log helps the doctor know how to care for you.  Your blood sugar log should have the time, date and the results.  Also, write down the amount of insulin or other medicine that you take.  Other information, like what you ate, exercise done and how you were feeling, will also be helpful.     Notes:         Patient Care Plan: General Pharmacy (Adult)     Problem Identified: Hypertension, Hyperlipidemia, Diabetes, Atrial Fibrillation, Heart Failure, Coronary Artery Disease, Osteoporosis, Allergic Rhinitis, and Chronic Pain   Priority: High     Long-Range Goal: Patient-Specific Goal   Start Date: 02/10/2021  Expected End Date: 02/10/2022  This Visit's Progress: On track  Priority: High  Note:   Current Barriers:  No barriers noted  Pharmacist Clinical Goal(s):  Patient will maintain control of diabetes as evidenced by A1c less than 8%  maintain control of heart failure as evidenced by stable weight, fluid status through collaboration with PharmD and provider.   Interventions: 1:1 collaboration with Steele Sizer, MD regarding development and update of comprehensive plan of care as evidenced by provider attestation and  co-signature Inter-disciplinary care team collaboration (see longitudinal plan of care) Comprehensive medication review performed; medication list updated in electronic medical record  Diabetes (A1c goal <8%) -Controlled -Managed by Dr. Honor Junes  -Current medications: Byduren BCISE 2 mg weekly Humalog 4-12 units three times daily (has not needed) Lantus 22 units nightly (15 nightly)  -Medications previously tried: NA  -Current home glucose readings: Utilizing freestyle libre system -Denies hypoglycemic/hyperglycemic symptoms -Recommended to continue current medication  Heart Failure (Goal: manage symptoms and prevent exacerbations) -Controlled -Last ejection fraction: 45-50% (Date: 01/23/21) -HF type: Combined Systolic and Diastolic -NYHA Class: II (slight limitation of activity) -AHA HF Stage: C (Heart disease and symptoms present) -Current treatment: Furosemide 40 mg daily  Losartan 25 mg 1/2 tablet daily  -Medications previously tried: Metoprolol (Bradycardia), irbesartan, HCTZ, Telmisartan -Current home BP/HR readings: NA -Patient weighs daily, reports weight has been stable -Recommended to continue current medication  Atrial Fibrillation (Goal: prevent stroke and major bleeding) -Controlled -CHADSVASC: 6 -Current treatment: Rate control: Amiodarone 200 mg twice daily Anticoagulation: Eliquis 5 mg twice daily  -Medications previously tried: Flecainide,  -Patient reports she has maintained sinus rhythm -Patient endorses some worsening hair loss  recently, last TSH was mildly elevated.  -Counseled on avoidance of NSAIDs due to increased bleeding risk with anticoagulants; -Recommend decreasing amiodarone to 200 mg daily  -Recommend rechecking TSH, T4  Hyperlipidemia: (LDL goal < 70) -Controlled -Current treatment: Rosuvastatin 10 mg daily  -Medications previously tried: NA  -Previously had extra supply, has caught up now.  -Recommended to continue current  medication  Osteoporosis (Goal Prevent bone fractures) -Controlled -Patient is a candidate for pharmacologic treatment due to T-Score < -2.5 in femoral neck -Current treatment  Ergocalciferol 50,000 units weekly Vitamin D3 1000 units daily Prolia 60 mg into the skin every 6 months (next shot due September) -Medications previously tried: NA  -Recommend stopping vitamin D3 for now. Could consider stopping ergocalciferol at this point, will defer until upcoming endocrinology appointment -Recommended to continue current medication  Patient Goals/Self-Care Activities Patient will:  - check glucose 3-4 times daily, document, and provide at future appointments check blood pressure weekly, document, and provide at future appointments weigh daily, and contact provider if weight gain of greater than 2 pounds in 24 hours  Follow Up Plan: Telephone follow up appointment with care management team member scheduled for:  05/17/2021 at 3:00 PM       Bonnie Rowland was given information about Chronic Care Management services today including:  CCM service includes personalized support from designated clinical staff supervised by her physician, including individualized plan of care and coordination with other care providers 24/7 contact phone numbers for assistance for urgent and routine care needs. Standard insurance, coinsurance, copays and deductibles apply for chronic care management only during months in which we provide at least 20 minutes of these services. Most insurances cover these services at 100%, however patients may be responsible for any copay, coinsurance and/or deductible if applicable. This service may help you avoid the need for more expensive face-to-face services. Only one practitioner may furnish and bill the service in a calendar month. The patient may stop CCM services at any time (effective at the end of the month) by phone call to the office staff.  Patient agreed to services  and verbal consent obtained.   Patient verbalizes understanding of instructions provided today and agrees to view in [place].   Malva Limes, [facility] [phone]

## 2021-02-10 NOTE — Patient Instructions (Signed)
Medication Instructions:  Your physician has recommended you make the following change in your medication:   DECREASE Amiodarone to 200 mg once daily START Carvedilol 3.125 mg  twice a day INCREASE Losartan 25 mg once a day STOP Toprol (Metoprolol succinate)   *If you need a refill on your cardiac medications before your next appointment, please call your pharmacy*   Lab Work: None  If you have labs (blood work) drawn today and your tests are completely normal, you will receive your results only by: [place] (if you have MyChart) OR A paper copy in the mail If you have any lab test that is abnormal or we need to change your treatment, we will call you to review the results.   Testing/Procedures: Your physician has requested that you have an echocardiogram in 6 weeks.   Echocardiography is a painless test that uses sound waves to create images of your heart. It provides your doctor with information about the size and shape of your heart and how well your heart's chambers and valves are working. This procedure takes approximately one hour. There are no restrictions for this procedure.    Follow-Up: At [facility], you and your health needs are our priority.  As part of our continuing mission to provide you with exceptional heart care, we have created designated Provider Care Teams.  These Care Teams include your primary Cardiologist (physician) and Advanced Practice Providers (APPs -  Physician Assistants and Nurse Practitioners) who all work together to provide you with the care you need, when you need it.   Your next appointment:   8 week(s)  The format for your next appointment:   In Person  Provider:   You may see Ida Rogue, MD or one of the following Advanced Practice Providers on your designated Care Team:   Murray Hodgkins, NP [name], [name] Marrianne Mood, [name] Cadence Kingston, [place]

## 2021-02-10 NOTE — Telephone Encounter (Signed)
Home Health Verbal Orders - Caller/Agency: Tiffany, [facility] Callback Number: [phone] pt 2 opt 2 Requesting Skilled Nursing Frequency: 1w9

## 2021-02-10 NOTE — Telephone Encounter (Signed)
Spoke with Tiffany with [facility] and gave verbal orders for skilled nursing 1 week for 9 weeks.

## 2021-02-13 DIAGNOSIS — J9601 Acute respiratory failure with hypoxia: Secondary | ICD-10-CM | POA: Diagnosis not present

## 2021-02-13 DIAGNOSIS — I5043 Acute on chronic combined systolic (congestive) and diastolic (congestive) heart failure: Secondary | ICD-10-CM | POA: Diagnosis not present

## 2021-02-13 DIAGNOSIS — I2510 Atherosclerotic heart disease of native coronary artery without angina pectoris: Secondary | ICD-10-CM | POA: Diagnosis not present

## 2021-02-13 DIAGNOSIS — I480 Paroxysmal atrial fibrillation: Secondary | ICD-10-CM | POA: Diagnosis not present

## 2021-02-13 DIAGNOSIS — I110 Hypertensive heart disease with heart failure: Secondary | ICD-10-CM | POA: Diagnosis not present

## 2021-02-13 DIAGNOSIS — I701 Atherosclerosis of renal artery: Secondary | ICD-10-CM | POA: Diagnosis not present

## 2021-02-14 DIAGNOSIS — I5043 Acute on chronic combined systolic (congestive) and diastolic (congestive) heart failure: Secondary | ICD-10-CM | POA: Diagnosis not present

## 2021-02-14 DIAGNOSIS — I480 Paroxysmal atrial fibrillation: Secondary | ICD-10-CM | POA: Diagnosis not present

## 2021-02-14 DIAGNOSIS — J9601 Acute respiratory failure with hypoxia: Secondary | ICD-10-CM | POA: Diagnosis not present

## 2021-02-14 DIAGNOSIS — I2510 Atherosclerotic heart disease of native coronary artery without angina pectoris: Secondary | ICD-10-CM | POA: Diagnosis not present

## 2021-02-14 DIAGNOSIS — I110 Hypertensive heart disease with heart failure: Secondary | ICD-10-CM | POA: Diagnosis not present

## 2021-02-14 DIAGNOSIS — I701 Atherosclerosis of renal artery: Secondary | ICD-10-CM | POA: Diagnosis not present

## 2021-02-15 DIAGNOSIS — I2510 Atherosclerotic heart disease of native coronary artery without angina pectoris: Secondary | ICD-10-CM | POA: Diagnosis not present

## 2021-02-15 DIAGNOSIS — I110 Hypertensive heart disease with heart failure: Secondary | ICD-10-CM | POA: Diagnosis not present

## 2021-02-15 DIAGNOSIS — J9601 Acute respiratory failure with hypoxia: Secondary | ICD-10-CM | POA: Diagnosis not present

## 2021-02-15 DIAGNOSIS — I701 Atherosclerosis of renal artery: Secondary | ICD-10-CM | POA: Diagnosis not present

## 2021-02-15 DIAGNOSIS — I5043 Acute on chronic combined systolic (congestive) and diastolic (congestive) heart failure: Secondary | ICD-10-CM | POA: Diagnosis not present

## 2021-02-15 DIAGNOSIS — I480 Paroxysmal atrial fibrillation: Secondary | ICD-10-CM | POA: Diagnosis not present

## 2021-02-16 ENCOUNTER — Encounter: Payer: Self-pay | Admitting: Podiatry

## 2021-02-16 ENCOUNTER — Telehealth: Payer: Self-pay | Admitting: Podiatry

## 2021-02-16 DIAGNOSIS — M5033 Other cervical disc degeneration, cervicothoracic region: Secondary | ICD-10-CM | POA: Diagnosis not present

## 2021-02-16 DIAGNOSIS — M9901 Segmental and somatic dysfunction of cervical region: Secondary | ICD-10-CM | POA: Diagnosis not present

## 2021-02-16 DIAGNOSIS — M9903 Segmental and somatic dysfunction of lumbar region: Secondary | ICD-10-CM | POA: Diagnosis not present

## 2021-02-16 DIAGNOSIS — M5416 Radiculopathy, lumbar region: Secondary | ICD-10-CM | POA: Diagnosis not present

## 2021-02-16 NOTE — Telephone Encounter (Signed)
cCalled patient lvm to reschedule 9/26 appt and sent reschedule letter

## 2021-02-17 ENCOUNTER — Telehealth: Payer: Self-pay | Admitting: Physician Assistant

## 2021-02-17 NOTE — Telephone Encounter (Signed)
Ok to take additional lasix '40mg'$  over the next few days to help with excess fluid.  Would ideally limit extra doses to 2-3 days.  If swelling persists after that time, she should let us know and we can schedule f/u.

## 2021-02-17 NOTE — Telephone Encounter (Signed)
Spoke with patient and she reports ankle swelling with weight gain. She reports weight is currently 185. Reviewed chart and weights have ranged 181-185 with one weight of 197 a month ago. Inquired if she has been wearing compression hose/socks, leg elevation, and watching her sodium intake. She confirmed doing all of this with leg elevation as well. We discussed water intake, foods that are high in potassium, and her current dose of medication. Currently she is taking Furosemide 40 mg once a day. She does report that the swelling is about the same. Discussed that recent weights are in line with numbers we have listed. Encouraged her to continue compression hose, leg elevation, and diet changes. Reviewed previous notes and it was noted that she is seeing [name] NP with last note recommending that she call them as well with weight gain or swelling. She has not called them yet. Advised I would send this note to provider for further review and recommendations. She verbalized understanding of our conversation and was agreeable with plan.

## 2021-02-17 NOTE — Telephone Encounter (Signed)
Pt c/o swelling: STAT is pt has developed SOB within 24 hours  If swelling, where is the swelling located? Slight swelling in ankle   How much weight have you gained and in what time span? 2 lbs last 2 days   Have you gained 3 pounds in a day or 5 pounds in a week? No   Do you have a log of your daily weights (if so, list)?  Wed 183 Fri 185   Are you currently taking a fluid pill? yes  Are you currently SOB? No   Have you traveled recently? No

## 2021-02-17 NOTE — Telephone Encounter (Signed)
Reviewed recommendations to take additional furosemide and to call back if swelling does not improve. She verbalized understanding of recommendations, repeated back, and had no further questions at this time.

## 2021-02-20 ENCOUNTER — Ambulatory Visit: Payer: Medicare Other | Admitting: Podiatry

## 2021-02-20 DIAGNOSIS — I2510 Atherosclerotic heart disease of native coronary artery without angina pectoris: Secondary | ICD-10-CM | POA: Diagnosis not present

## 2021-02-20 DIAGNOSIS — I5043 Acute on chronic combined systolic (congestive) and diastolic (congestive) heart failure: Secondary | ICD-10-CM | POA: Diagnosis not present

## 2021-02-20 DIAGNOSIS — I701 Atherosclerosis of renal artery: Secondary | ICD-10-CM | POA: Diagnosis not present

## 2021-02-20 DIAGNOSIS — I110 Hypertensive heart disease with heart failure: Secondary | ICD-10-CM | POA: Diagnosis not present

## 2021-02-20 DIAGNOSIS — J9601 Acute respiratory failure with hypoxia: Secondary | ICD-10-CM | POA: Diagnosis not present

## 2021-02-20 DIAGNOSIS — I480 Paroxysmal atrial fibrillation: Secondary | ICD-10-CM | POA: Diagnosis not present

## 2021-02-21 ENCOUNTER — Other Ambulatory Visit: Payer: Self-pay | Admitting: Family Medicine

## 2021-02-21 DIAGNOSIS — I701 Atherosclerosis of renal artery: Secondary | ICD-10-CM

## 2021-02-21 DIAGNOSIS — E1169 Type 2 diabetes mellitus with other specified complication: Secondary | ICD-10-CM

## 2021-02-21 NOTE — Telephone Encounter (Signed)
Last seen 8.11.2022 Upcoming appt: 1.11.2023

## 2021-02-21 NOTE — Telephone Encounter (Signed)
Requested medication (s) are due for refill today: no  Requested medication (s) are on the active medication list: yes  Last refill:  01/16/21 #90 3 refills  Future visit scheduled: yes in 4 months   Notes to clinic:  ordered by Johnny Bridge, MD do you want to refill Rx?     Requested Prescriptions  Pending Prescriptions Disp Refills   rosuvastatin (CRESTOR) 10 MG tablet [Pharmacy Med Name: ROSUVASTATIN CALCIUM 10 MG TAB] 48 tablet 1    Sig: TAKE 1 TABLET (10 MG TOTAL) BY MOUTH 4 (FOUR) TIMES A WEEK.     Cardiovascular:  Antilipid - Statins Passed - 02/21/2021  2:38 PM      Passed - Total Cholesterol in normal range and within 360 days    Cholesterol, Total  Date Value Ref Range Status  01/12/2016 110 100 - 199 mg/dL Final   Cholesterol  Date Value Ref Range Status  07/12/2020 130 <200 mg/dL Final          Passed - LDL in normal range and within 360 days    LDL Cholesterol (Calc)  Date Value Ref Range Status  07/12/2020 38 mg/dL (calc) Final    Comment:    Reference range: <100 . Desirable range <100 mg/dL for primary prevention;   <70 mg/dL for patients with CHD or diabetic patients  with > or = 2 CHD risk factors. Marland Kitchen LDL-C is now calculated using the Martin-Hopkins  calculation, which is a validated novel method providing  better accuracy than the Friedewald equation in the  estimation of LDL-C.  Cresenciano Genre et al. Annamaria Helling. [id number]): 2061-2068  (http://education.[employer].com/faq/FAQ164)           Passed - HDL in normal range and within 360 days    HDL  Date Value Ref Range Status  07/12/2020 75 > OR = 50 mg/dL Final  01/12/2016 59 >39 mg/dL Final          Passed - Triglycerides in normal range and within 360 days    Triglycerides  Date Value Ref Range Status  07/12/2020 86 <150 mg/dL Final          Passed - Patient is not pregnant      Passed - Valid encounter within last 12 months    Recent Outpatient Visits           1 week ago Hospital  discharge follow-up   [facility] Steele Sizer, MD   1 month ago 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[facility])   [facility] [place], Drue Stager, MD   7 months ago Diabetes mellitus type 2 in obese [facility])   [facility] Steele Sizer, MD   1 year ago [facility] Steele Sizer, MD   1 year ago Atherosclerosis of renal artery [facility])   [facility] Steele Sizer, MD       Future Appointments             In 1 month Dunn, Areta Haber, [name] [place], [place]   In 1 month  [facility], [place]   In 3 months [place], G[facility]   In 4 months Steele Sizer, MD [facility], [facility]

## 2021-02-23 DIAGNOSIS — I480 Paroxysmal atrial fibrillation: Secondary | ICD-10-CM | POA: Diagnosis not present

## 2021-02-23 DIAGNOSIS — J9601 Acute respiratory failure with hypoxia: Secondary | ICD-10-CM | POA: Diagnosis not present

## 2021-02-23 DIAGNOSIS — I2510 Atherosclerotic heart disease of native coronary artery without angina pectoris: Secondary | ICD-10-CM | POA: Diagnosis not present

## 2021-02-23 DIAGNOSIS — I701 Atherosclerosis of renal artery: Secondary | ICD-10-CM | POA: Diagnosis not present

## 2021-02-23 DIAGNOSIS — I5043 Acute on chronic combined systolic (congestive) and diastolic (congestive) heart failure: Secondary | ICD-10-CM | POA: Diagnosis not present

## 2021-02-23 DIAGNOSIS — I110 Hypertensive heart disease with heart failure: Secondary | ICD-10-CM | POA: Diagnosis not present

## 2021-02-24 ENCOUNTER — Other Ambulatory Visit: Payer: Self-pay

## 2021-02-24 ENCOUNTER — Ambulatory Visit: Payer: Medicare Other | Attending: Family | Admitting: Family

## 2021-02-24 ENCOUNTER — Encounter: Payer: Self-pay | Admitting: Family

## 2021-02-24 VITALS — BP 169/60 | HR 63 | Resp 18 | Ht 63.0 in | Wt 188.0 lb

## 2021-02-24 DIAGNOSIS — Z79899 Other long term (current) drug therapy: Secondary | ICD-10-CM | POA: Diagnosis not present

## 2021-02-24 DIAGNOSIS — Z7901 Long term (current) use of anticoagulants: Secondary | ICD-10-CM | POA: Diagnosis not present

## 2021-02-24 DIAGNOSIS — E119 Type 2 diabetes mellitus without complications: Secondary | ICD-10-CM | POA: Diagnosis not present

## 2021-02-24 DIAGNOSIS — Z7983 Long term (current) use of bisphosphonates: Secondary | ICD-10-CM | POA: Diagnosis not present

## 2021-02-24 DIAGNOSIS — I480 Paroxysmal atrial fibrillation: Secondary | ICD-10-CM | POA: Diagnosis not present

## 2021-02-24 DIAGNOSIS — R600 Localized edema: Secondary | ICD-10-CM | POA: Insufficient documentation

## 2021-02-24 DIAGNOSIS — Z794 Long term (current) use of insulin: Secondary | ICD-10-CM | POA: Insufficient documentation

## 2021-02-24 DIAGNOSIS — E785 Hyperlipidemia, unspecified: Secondary | ICD-10-CM | POA: Insufficient documentation

## 2021-02-24 DIAGNOSIS — Z881 Allergy status to other antibiotic agents status: Secondary | ICD-10-CM | POA: Diagnosis not present

## 2021-02-24 DIAGNOSIS — Z79890 Hormone replacement therapy (postmenopausal): Secondary | ICD-10-CM | POA: Insufficient documentation

## 2021-02-24 DIAGNOSIS — K219 Gastro-esophageal reflux disease without esophagitis: Secondary | ICD-10-CM | POA: Insufficient documentation

## 2021-02-24 DIAGNOSIS — I5042 Chronic combined systolic (congestive) and diastolic (congestive) heart failure: Secondary | ICD-10-CM

## 2021-02-24 DIAGNOSIS — I10 Essential (primary) hypertension: Secondary | ICD-10-CM

## 2021-02-24 DIAGNOSIS — I2510 Atherosclerotic heart disease of native coronary artery without angina pectoris: Secondary | ICD-10-CM | POA: Insufficient documentation

## 2021-02-24 DIAGNOSIS — I110 Hypertensive heart disease with heart failure: Secondary | ICD-10-CM | POA: Insufficient documentation

## 2021-02-24 DIAGNOSIS — I5022 Chronic systolic (congestive) heart failure: Secondary | ICD-10-CM | POA: Insufficient documentation

## 2021-02-24 DIAGNOSIS — E1169 Type 2 diabetes mellitus with other specified complication: Secondary | ICD-10-CM

## 2021-02-24 MED ORDER — SACUBITRIL-VALSARTAN 24-26 MG PO TABS: 1.0000 | ORAL_TABLET | Freq: Two times a day (BID) | ORAL | 3 refills | Status: DC

## 2021-02-24 MED ORDER — FUROSEMIDE 40 MG PO TABS: 40.0000 mg | ORAL_TABLET | Freq: Two times a day (BID) | ORAL | 5 refills | Status: DC

## 2021-02-24 NOTE — Patient Instructions (Addendum)
Continue weighing daily and call for an overnight weight gain of > 2 pounds or a weekly weight gain of >5 pounds.   Stop taking losartan.   Start taking entresto on 8/27 to replace losartan.

## 2021-02-24 NOTE — Progress Notes (Signed)
Patient ID: Bonnie Rowland, female    DOB: 04-10-1941, 80 y.o.   MRN: [id number]  HPI  Ms Egert is a 80 y/o female with a history of HTN, DM, hyperlipidemia, atrial fibrillation, GERD and chronic heart failure.   Echo report from 01/23/21 reviewed and showed an EF of 45-50% with mild LVH, severe hypokinesis of left ventricle and trivial MR.   RHC/LHC done 01/25/21 and showed: Mild to moderate, non-obstructive coronary artery disease, including 30% mid LAD disease, 50-60% mid/distal LCx stenosis, and 60% proximal/mid RCA lesion. Mildly to moderately reduced left ventricular systolic function with mid anterior hypo/akinesis; query atypical Takotsubo variant.  LVEF ~45%. Mildly elevated left heart filling pressures. Moderately elevated right heart filling and pulmonary artery pressures with significant pulmonary vascular resistance. Mildly reduced Fick cardiac output/index.  Admitted 01/23/21 due to HF exacerbation along with HTN. Initially given IV lasix and IV morphine and then transitioned to oral diuretics. Had to be placed on bipap. Cardiology consult obtained. Successfully cardioverted 01/30/21. Beta-blocker held. Elevated troponin thought to be due to demand ischemia. Discharged after 8 days.   She presents today for a follow-up of increased weight gain that began two days ago following eating an excessive amount of sodium at a social gathering. She denies CP, cough, SOB, palpitations, DOE, dizziness, syncope. She has been wearing her compression socks, adhering to fluid restrictions and sodium restrictions since then. She was instructed to increased her lasix to 40 mg BID when she called to report her weight gain, which she did, and returns today for follow up.   Past Medical History:  Diagnosis Date   Acute cystitis    Acute on chronic combined systolic and diastolic CHF (congestive heart failure) (HCC)    Allergic rhinitis, cause unspecified    Arthritis 2015   Atherosclerosis of  renal artery (HCC)    left   Atrial fibrillation (HCC)    Bronchitis, not specified as acute or chronic    Cancer ([place]) 12/2013   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; left shoulder blade   Cancer ([place]) 05/2014   basal cell removed left temple   Cataract 2003   Cellulitis and abscess of leg, except foot    Conjunctivitis unspecified    Dermatophytosis of nail    Diabetes mellitus    type II   Esophageal reflux    Hyperlipidemia    Hypertension    Osteoporosis 2016   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)    Renal artery stenosis (HCC)    Sprain of lumbar region    Thoracic or lumbosacral neuritis or radiculitis, unspecified    Urinary tract infection, site not specified    Past Surgical History:  Procedure Laterality Date   APPENDECTOMY     CARDIAC CATHETERIZATION     CARDIOVERSION N/A 11/30/2020   Procedure: CARDIOVERSION;  Surgeon: Minna Merritts, MD;  Location: [facility];  Service: Cardiovascular;  Laterality: N/A;   CARDIOVERSION N/A 01/20/2021   Procedure: CARDIOVERSION;  Surgeon: Minna Merritts, MD;  Location: [facility];  Service: Cardiovascular;  Laterality: N/A;   CARDIOVERSION N/A 01/30/2021   Procedure: CARDIOVERSION;  Surgeon: Wellington Hampshire, MD;  Location: [facility];  Service: Cardiovascular;  Laterality: N/A;   cataract surgery     COLONOSCOPY     COLONOSCOPY WITH PROPOFOL N/A 05/09/2016   Procedure: COLONOSCOPY WITH PROPOFOL;  Surgeon: Robert Bellow, MD;  Location: [facility];  Service: Endoscopy;  Laterality: N/A;   DIAGNOSTIC MAMMOGRAM     EYE SURGERY Bilateral  Cataract Extraction   JOINT REPLACEMENT  2016   KNEE ARTHROPLASTY Right 03/30/2015   Procedure: COMPUTER ASSISTED TOTAL KNEE ARTHROPLASTY;  Surgeon: Dereck Leep, MD;  Location: [facility];  Service: Orthopedics;  Laterality: Right;   KNEE ARTHROSCOPY Right    KNEE CLOSED REDUCTION Right 05/23/2015   Procedure: CLOSED MANIPULATION KNEE;  Surgeon: Dereck Leep, MD;  Location: [facility];  Service: Orthopedics;   Laterality: Right;   RIGHT/LEFT HEART CATH AND CORONARY ANGIOGRAPHY N/A 01/25/2021   Procedure: RIGHT/LEFT HEART CATH AND CORONARY ANGIOGRAPHY;  Surgeon: Nelva Bush, MD;  Location: [facility];  Service: Cardiovascular;  Laterality: N/A;   TONSILLECTOMY     Family History  Problem Relation Age of Onset   Diabetes Mother    Hypertension Mother    Cancer Mother    Kidney disease Mother    Hypertension Father    Cancer Father        Prostate   Breast cancer Maternal Aunt 69   Colon cancer Son    Kidney cancer Neg Hx    Bladder Cancer Neg Hx    Social History   Tobacco Use   Smoking status: Never   Smokeless tobacco: Never  Substance Use Topics   Alcohol use: Not Currently    Alcohol/week: 1.0 standard drink    Types: 1 Glasses of wine per week   Allergies  Allergen Reactions   Cyclobenzaprine Hypertension   Cheese Other (See Comments)    bloating   Ciprofloxacin Hcl Other (See Comments)    Muscle pain   Coconut Oil     Upset stomach   Keflex [Cephalexin] Other (See Comments)    Patient prefers not to take this medication due to the side effects, caused tendon issues   Prior to Admission medications   Medication Sig Start Date End Date Taking? Authorizing Provider  acetaminophen (TYLENOL) 650 MG CR tablet Take 1,300 mg by mouth at bedtime.   Yes [provider]  ALFALFA PO Take 1 tablet by mouth daily.   Yes [provider]  amiodarone (PACERONE) 200 MG tablet Take 1 tablet (200 mg total) by mouth daily. 02/10/21  Yes Dunn, Areta Haber, [name]  apixaban (ELIQUIS) 5 MG TABS tablet Take 1 tablet (5 mg total) by mouth 2 (two) times daily. 09/26/20  Yes Gollan, Kathlene November, MD  ascorbic Acid (VITAMIN C) 500 MG CPCR Take 500 mg by mouth daily.   Yes [provider]  B Complex-C (B-COMPLEX WITH VITAMIN C) tablet Take 1 tablet by mouth 2 (two) times daily.   Yes [provider]  BD PEN NEEDLE NANO U/F 32G X 4 MM MISC USE 4 TIMES DAILY  (HUMALOG 3 TIMES DAILY AND LANTUS ONCE DAILY) OFFICE NOTIFIED 08/06/17 12/07/18  Yes Sowles, Drue Stager, MD  BLACK ELDERBERRY,BERRY-FLOWER, PO Take 15 mLs by mouth daily. Liquid   Yes [provider]  Calcium Carbonate (CALCIUM 600 PO) Take 600 mg by mouth daily.   Yes [provider]  carvedilol (COREG) 3.125 MG tablet Take 1 tablet (3.125 mg total) by mouth 2 (two) times daily. 02/10/21 05/11/21 Yes Dunn, Areta Haber, [name]  cholecalciferol (VITAMIN D) 1000 UNITS tablet Take 1,000 Units by mouth daily. [place]   Yes [provider]  clobetasol ointment (TEMOVATE) [id number] % APPLY 1 APPLICATION TOPICALLY 2 TIMES DAILY AS NEEDED (BUG BITES). 12/06/20  Yes Sowles, Drue Stager, MD  Coenzyme Q10 (COQ10) 200 MG CAPS Take 200 mg by mouth daily.   Yes [provider]  Continuous Blood Gluc Sensor ([place]) MISC by Does not apply route.   Yes [provider]  Elastic Bandages & Supports (MEDICAL COMPRESSION STOCKINGS) [place] 1 each by Does not apply route daily. 01/08/19  Yes Sowles, Drue Stager, MD  estradiol (ESTRACE VAGINAL) 0.1 MG/GM vaginal cream 1/2 gm once weekly using applicator, apply blueberry sized amount of cream using tip of finger to urethra twice weekly 06/10/20  Yes McGowan, Larene Beach A, [name]  Exenatide ER (BYDUREON BCISE) 2 MG/0.85ML AUIJ Inject 2 mg into the skin every Sunday. 02/26/20  Yes [provider]  fluticasone (FLONASE) 50 MCG/ACT nasal spray Place 2 sprays into both nostrils daily as needed for allergies or rhinitis.   Yes [provider]  furosemide (LASIX) 40 MG tablet Take 1 tablet (40 mg total) by mouth daily. 02/01/21 03/03/21 Yes Wyvonnia Dusky, MD  GARLIC [id number] PO Take [id number] mg by mouth daily.   Yes [provider]  Glucosamine 500 MG TABS Take 500 mg by mouth daily.   Yes [provider]  HUMALOG KWIKPEN 100 UNIT/ML KiwkPen Inject 4-12 Units into the skin 3 (three) times daily before meals. If needed  sliding scale 10/15/17  Yes [provider]  insulin glargine (LANTUS) 100 unit/mL SOPN Inject 22 Units into the skin at bedtime.   Yes [provider]  Javier Docker Oil 1000 MG CAPS Take 1,000 mg by mouth daily.   Yes [provider]  losartan (COZAAR) 25 MG tablet Take 1 tablet (25 mg total) by mouth daily. 02/10/21 03/12/21 Yes Dunn, Areta Haber, [name]  Mag Aspart-Potassium Aspart (POTASSIUM & MAGNESIUM ASPARTAT PO) Take 1 tablet by mouth daily.   Yes [provider]  Multiple Vitamins-Minerals (PRESERVISION AREDS 2) CAPS Take 1 capsule by mouth 2 (two) times daily.   Yes [provider]  OVER THE COUNTER MEDICATION Place 1 application into both eyes See admin instructions. Blephadex eyelid foam, apply to eyelids once daily   Yes [provider]  OVER THE COUNTER MEDICATION Take 1 tablet by mouth daily. Vita-Lea Gold otc supplement With out vitamin K ([place] products)   Yes [provider]  OVER THE COUNTER MEDICATION Take 1 tablet by mouth See admin instructions. Nutriferon otc supplement take Take 1 tablet on Sun, Tues, Thurs, Sat. Take 1 tablet twice daily on Mon, Wed, and Fri   Yes [provider]  Probiotic Product (PROBIOTIC PO) Take 1 capsule by mouth at bedtime.   Yes [provider]  PROLIA 60 MG/ML SOSY injection Inject 60 mg into the skin every 6 (six) months. 09/02/19  Yes [provider]  Propylene Glycol (SYSTANE BALANCE OP) Place 1 drop into both eyes daily as needed (dry eyes).   Yes [provider]  rosuvastatin (CRESTOR) 10 MG tablet TAKE 1 TABLET (10 MG TOTAL) BY MOUTH 4 (FOUR) TIMES A WEEK. 02/21/21  Yes Sowles, Drue Stager, MD  Sodium Chloride-Sodium Bicarb (NETI POT SINUS [place] NA) Place 1 Dose into the nose at bedtime.   Yes [provider]  tiZANidine (ZANAFLEX) 2 MG tablet Take 1 tablet (2 mg total) by mouth at bedtime. 01/25/21  Yes Sowles, Drue Stager, MD  Vitamin D, Ergocalciferol, (DRISDOL)  1.25 MG (50000 UT) CAPS capsule Take 50,000 Units by mouth every Saturday. 11/23/18  Yes [provider]    Review of Systems  Constitutional:  Negative for appetite change and fatigue.  HENT:  Negative for congestion, postnasal drip and sore throat.   Eyes: Negative.  Respiratory:  Positive for cough (dry cough) and shortness of breath (mild). Negative for chest tightness and wheezing.   Cardiovascular:  Positive for leg swelling (up to knees bilaterally). Negative for chest pain and palpitations.  Gastrointestinal:  Negative for abdominal distention and abdominal pain.  Endocrine: Negative.   Genitourinary: Negative.   Musculoskeletal:  Positive for back pain (chronic). Negative for neck pain.  Skin: Negative.   Allergic/Immunologic: Negative.   Neurological:  Negative for dizziness, weakness and light-headedness.  Hematological:  Negative for adenopathy. Bruises/bleeds easily.  Psychiatric/Behavioral:  Positive for sleep disturbance (sleeping in recliner due to comfort, reports she sleeps well). Negative for dysphoric mood. The patient is not nervous/anxious.     Vitals with BMI 02/24/2021 02/10/2021 02/09/2021  Height '5\' 3"'$  '5\' 3"'$  -  Weight 188 lbs 184 lbs -  BMI 123XX123 [id number] -  Systolic 123XX123 [id number] 0000000  Diastolic 60 80 76  Pulse 63 70 -    Lab Results  Component Value Date   CREATININE 0.94 02/07/2021   CREATININE 0.90 01/31/2021   CREATININE 0.69 01/30/2021     Physical Exam Vitals and nursing note reviewed.  Constitutional:      Appearance: Normal appearance.  HENT:     Head: Normocephalic and atraumatic.  Cardiovascular:     Rate and Rhythm: Normal rate and regular rhythm. S1, S2.  Pulmonary:     Effort: Pulmonary effort is normal. No respiratory distress.     Breath sounds: No wheezing or rales.  Abdominal:     General: Abdomen is flat. There is no distension.     Palpations: Abdomen is soft.  Musculoskeletal:        General: No tenderness.     Cervical  back: Normal range of motion and neck supple.     Right lower leg: Edema +2  present to knee.     Left lower leg: Edema +2 present to knee.  Skin:    General: Skin is warm and dry.  Neurological:     General: No focal deficit present.     Mental Status: She is alert and oriented to person, place, and time.  Psychiatric:        Mood and Affect: Mood normal.        Behavior: Behavior normal.        Thought Content: Thought content normal.      Assessment & Plan:  1: Chronic heart failure with mildly reduced ejection fraction- - NYHA class II - euvolemic today - weighing daily; reminded to call for an overnight weight gain of > 2 pounds or a weekly weight gain of > 5 pounds. She did have a 2 lb wt gain two days ago, lasix was increased to 40 mg BID x 3 days.  - weight 186 pounds at home this am, 187 two days ago-reports she consumed fried chicken and BBQ that resulted in edema and weight gain - not adding salt and has been reading food labels - on GDMT of losartan currently, plan to dc losartan and start Entresto on 8/27 - will be having PT and nursing starting next week - wearing compression socks daily and removes HS - BNP 01/22/21 was 237.3  2: HTN- - BP 169/60, took her meds today but reports rushing around this am - saw PCP Ancil Boozer) 02/09/21 - BMP 02/07/21 reviewed and showed sodium 132, potassium 4.3, creatinine 0.94 & GFR >60  3: DM- - A1c 01/09/21 was 6.5% - glucose at home today was 122  4:  Paroxysmal atrial fibrillation- - successfully cardioverted 01/30/21 - saw cardiology (Dunn) 02/10/21 - RRR noted during exam    Medications reviewed with patient.   Return in 3 weeks or sooner for any questions/problems before then.

## 2021-02-27 DIAGNOSIS — I701 Atherosclerosis of renal artery: Secondary | ICD-10-CM | POA: Diagnosis not present

## 2021-02-27 DIAGNOSIS — I110 Hypertensive heart disease with heart failure: Secondary | ICD-10-CM | POA: Diagnosis not present

## 2021-02-27 DIAGNOSIS — I5043 Acute on chronic combined systolic (congestive) and diastolic (congestive) heart failure: Secondary | ICD-10-CM | POA: Diagnosis not present

## 2021-02-27 DIAGNOSIS — I2510 Atherosclerotic heart disease of native coronary artery without angina pectoris: Secondary | ICD-10-CM | POA: Diagnosis not present

## 2021-02-27 DIAGNOSIS — J9601 Acute respiratory failure with hypoxia: Secondary | ICD-10-CM | POA: Diagnosis not present

## 2021-02-27 DIAGNOSIS — I480 Paroxysmal atrial fibrillation: Secondary | ICD-10-CM | POA: Diagnosis not present

## 2021-02-28 ENCOUNTER — Other Ambulatory Visit: Payer: Self-pay | Admitting: Cardiovascular Disease

## 2021-02-28 NOTE — Telephone Encounter (Signed)
Prescription refill request for Eliquis received. Indication: Atrial fib Last office visit: 02/24/21  Bonnie Infante FNP Scr: 0.94 on 02/07/21 Age: 80 Weight: 85.3kg  Based on above findings Eliquis '5mg'$  twice daily is the appropriate dose.  Refill approved.

## 2021-02-28 NOTE — Telephone Encounter (Signed)
Please review for refill, Thanks !  

## 2021-03-02 ENCOUNTER — Telehealth: Payer: Self-pay | Admitting: Family

## 2021-03-02 DIAGNOSIS — M5033 Other cervical disc degeneration, cervicothoracic region: Secondary | ICD-10-CM | POA: Diagnosis not present

## 2021-03-02 DIAGNOSIS — M5416 Radiculopathy, lumbar region: Secondary | ICD-10-CM | POA: Diagnosis not present

## 2021-03-02 DIAGNOSIS — I5043 Acute on chronic combined systolic (congestive) and diastolic (congestive) heart failure: Secondary | ICD-10-CM | POA: Diagnosis not present

## 2021-03-02 DIAGNOSIS — I701 Atherosclerosis of renal artery: Secondary | ICD-10-CM | POA: Diagnosis not present

## 2021-03-02 DIAGNOSIS — M9901 Segmental and somatic dysfunction of cervical region: Secondary | ICD-10-CM | POA: Diagnosis not present

## 2021-03-02 DIAGNOSIS — I480 Paroxysmal atrial fibrillation: Secondary | ICD-10-CM | POA: Diagnosis not present

## 2021-03-02 DIAGNOSIS — I2510 Atherosclerotic heart disease of native coronary artery without angina pectoris: Secondary | ICD-10-CM | POA: Diagnosis not present

## 2021-03-02 DIAGNOSIS — I110 Hypertensive heart disease with heart failure: Secondary | ICD-10-CM | POA: Diagnosis not present

## 2021-03-02 DIAGNOSIS — M9903 Segmental and somatic dysfunction of lumbar region: Secondary | ICD-10-CM | POA: Diagnosis not present

## 2021-03-02 DIAGNOSIS — J9601 Acute respiratory failure with hypoxia: Secondary | ICD-10-CM | POA: Diagnosis not present

## 2021-03-02 NOTE — Telephone Encounter (Signed)
Spoke with patient to notify her she was approved for patient assistance for entresto till the end of the calendar year and how to set up mail order delivery.   Alexandra Posadas, NT

## 2021-03-03 DIAGNOSIS — Z9181 History of falling: Secondary | ICD-10-CM | POA: Diagnosis not present

## 2021-03-03 DIAGNOSIS — E785 Hyperlipidemia, unspecified: Secondary | ICD-10-CM | POA: Diagnosis not present

## 2021-03-03 DIAGNOSIS — J9601 Acute respiratory failure with hypoxia: Secondary | ICD-10-CM | POA: Diagnosis not present

## 2021-03-03 DIAGNOSIS — E1169 Type 2 diabetes mellitus with other specified complication: Secondary | ICD-10-CM | POA: Diagnosis not present

## 2021-03-03 DIAGNOSIS — M810 Age-related osteoporosis without current pathological fracture: Secondary | ICD-10-CM | POA: Diagnosis not present

## 2021-03-03 DIAGNOSIS — J40 Bronchitis, not specified as acute or chronic: Secondary | ICD-10-CM | POA: Diagnosis not present

## 2021-03-03 DIAGNOSIS — M5417 Radiculopathy, lumbosacral region: Secondary | ICD-10-CM | POA: Diagnosis not present

## 2021-03-03 DIAGNOSIS — Z7901 Long term (current) use of anticoagulants: Secondary | ICD-10-CM | POA: Diagnosis not present

## 2021-03-03 DIAGNOSIS — K219 Gastro-esophageal reflux disease without esophagitis: Secondary | ICD-10-CM | POA: Diagnosis not present

## 2021-03-03 DIAGNOSIS — M1990 Unspecified osteoarthritis, unspecified site: Secondary | ICD-10-CM | POA: Diagnosis not present

## 2021-03-03 DIAGNOSIS — I480 Paroxysmal atrial fibrillation: Secondary | ICD-10-CM | POA: Diagnosis not present

## 2021-03-03 DIAGNOSIS — I5043 Acute on chronic combined systolic (congestive) and diastolic (congestive) heart failure: Secondary | ICD-10-CM | POA: Diagnosis not present

## 2021-03-03 DIAGNOSIS — I110 Hypertensive heart disease with heart failure: Secondary | ICD-10-CM | POA: Diagnosis not present

## 2021-03-03 DIAGNOSIS — I701 Atherosclerosis of renal artery: Secondary | ICD-10-CM | POA: Diagnosis not present

## 2021-03-03 DIAGNOSIS — I2510 Atherosclerotic heart disease of native coronary artery without angina pectoris: Secondary | ICD-10-CM | POA: Diagnosis not present

## 2021-03-06 DIAGNOSIS — Z20822 Contact with and (suspected) exposure to covid-19: Secondary | ICD-10-CM | POA: Diagnosis not present

## 2021-03-08 DIAGNOSIS — I110 Hypertensive heart disease with heart failure: Secondary | ICD-10-CM | POA: Diagnosis not present

## 2021-03-08 DIAGNOSIS — I5043 Acute on chronic combined systolic (congestive) and diastolic (congestive) heart failure: Secondary | ICD-10-CM | POA: Diagnosis not present

## 2021-03-08 DIAGNOSIS — I701 Atherosclerosis of renal artery: Secondary | ICD-10-CM | POA: Diagnosis not present

## 2021-03-08 DIAGNOSIS — I2510 Atherosclerotic heart disease of native coronary artery without angina pectoris: Secondary | ICD-10-CM | POA: Diagnosis not present

## 2021-03-08 DIAGNOSIS — I480 Paroxysmal atrial fibrillation: Secondary | ICD-10-CM | POA: Diagnosis not present

## 2021-03-08 DIAGNOSIS — J9601 Acute respiratory failure with hypoxia: Secondary | ICD-10-CM | POA: Diagnosis not present

## 2021-03-13 ENCOUNTER — Other Ambulatory Visit: Payer: Self-pay

## 2021-03-13 ENCOUNTER — Other Ambulatory Visit: Payer: Self-pay | Admitting: Family

## 2021-03-13 ENCOUNTER — Encounter
Admission: RE | Admit: 2021-03-13 | Discharge: 2021-03-13 | Disposition: A | Discharge status: Home / Self Care | Payer: Medicare Other | Source: Ambulatory Visit | Attending: Family | Admitting: Family

## 2021-03-13 ENCOUNTER — Encounter: Payer: Self-pay | Admitting: Urgent Care

## 2021-03-13 ENCOUNTER — Ambulatory Visit: Payer: Medicare Other | Admitting: Family

## 2021-03-13 DIAGNOSIS — Z01812 Encounter for preprocedural laboratory examination: Secondary | ICD-10-CM | POA: Insufficient documentation

## 2021-03-13 LAB — BASIC METABOLIC PANEL
Anion gap: 10 (ref 5–15)
BUN: 16 mg/dL (ref 8–23)
CO2: 27 mmol/L (ref 22–32)
Calcium: 8.9 mg/dL (ref 8.9–10.3)
Chloride: 102 mmol/L (ref 98–111)
Creatinine, Ser: 0.83 mg/dL (ref 0.44–1.00)
GFR, Estimated: >60 mL/min (ref >60)
Glucose, Bld: 171 mg/dL — ABNORMAL HIGH (ref 70–99)
Potassium: 3.7 mmol/L (ref 3.5–5.1)
Sodium: 139 mmol/L (ref 135–145)

## 2021-03-14 DIAGNOSIS — I110 Hypertensive heart disease with heart failure: Secondary | ICD-10-CM | POA: Diagnosis not present

## 2021-03-14 DIAGNOSIS — J9601 Acute respiratory failure with hypoxia: Secondary | ICD-10-CM | POA: Diagnosis not present

## 2021-03-14 DIAGNOSIS — I701 Atherosclerosis of renal artery: Secondary | ICD-10-CM | POA: Diagnosis not present

## 2021-03-14 DIAGNOSIS — I5043 Acute on chronic combined systolic (congestive) and diastolic (congestive) heart failure: Secondary | ICD-10-CM | POA: Diagnosis not present

## 2021-03-14 DIAGNOSIS — I480 Paroxysmal atrial fibrillation: Secondary | ICD-10-CM | POA: Diagnosis not present

## 2021-03-14 DIAGNOSIS — I2510 Atherosclerotic heart disease of native coronary artery without angina pectoris: Secondary | ICD-10-CM | POA: Diagnosis not present

## 2021-03-17 DIAGNOSIS — I701 Atherosclerosis of renal artery: Secondary | ICD-10-CM | POA: Diagnosis not present

## 2021-03-17 DIAGNOSIS — Z23 Encounter for immunization: Secondary | ICD-10-CM | POA: Diagnosis not present

## 2021-03-17 DIAGNOSIS — J9601 Acute respiratory failure with hypoxia: Secondary | ICD-10-CM | POA: Diagnosis not present

## 2021-03-17 DIAGNOSIS — I480 Paroxysmal atrial fibrillation: Secondary | ICD-10-CM | POA: Diagnosis not present

## 2021-03-17 DIAGNOSIS — I110 Hypertensive heart disease with heart failure: Secondary | ICD-10-CM | POA: Diagnosis not present

## 2021-03-17 DIAGNOSIS — I5043 Acute on chronic combined systolic (congestive) and diastolic (congestive) heart failure: Secondary | ICD-10-CM | POA: Diagnosis not present

## 2021-03-17 DIAGNOSIS — I2510 Atherosclerotic heart disease of native coronary artery without angina pectoris: Secondary | ICD-10-CM | POA: Diagnosis not present

## 2021-03-21 DIAGNOSIS — I701 Atherosclerosis of renal artery: Secondary | ICD-10-CM | POA: Diagnosis not present

## 2021-03-21 DIAGNOSIS — I480 Paroxysmal atrial fibrillation: Secondary | ICD-10-CM | POA: Diagnosis not present

## 2021-03-21 DIAGNOSIS — I2510 Atherosclerotic heart disease of native coronary artery without angina pectoris: Secondary | ICD-10-CM | POA: Diagnosis not present

## 2021-03-21 DIAGNOSIS — J9601 Acute respiratory failure with hypoxia: Secondary | ICD-10-CM | POA: Diagnosis not present

## 2021-03-21 DIAGNOSIS — I110 Hypertensive heart disease with heart failure: Secondary | ICD-10-CM | POA: Diagnosis not present

## 2021-03-21 DIAGNOSIS — I5043 Acute on chronic combined systolic (congestive) and diastolic (congestive) heart failure: Secondary | ICD-10-CM | POA: Diagnosis not present

## 2021-03-22 ENCOUNTER — Ambulatory Visit: Payer: Medicare Other | Attending: Family | Admitting: Family

## 2021-03-22 ENCOUNTER — Encounter: Payer: Self-pay | Admitting: Family

## 2021-03-22 VITALS — BP 142/48 | HR 56 | Resp 18 | Ht 63.0 in | Wt 187.5 lb

## 2021-03-22 DIAGNOSIS — Z833 Family history of diabetes mellitus: Secondary | ICD-10-CM | POA: Insufficient documentation

## 2021-03-22 DIAGNOSIS — I2510 Atherosclerotic heart disease of native coronary artery without angina pectoris: Secondary | ICD-10-CM | POA: Diagnosis not present

## 2021-03-22 DIAGNOSIS — E1169 Type 2 diabetes mellitus with other specified complication: Secondary | ICD-10-CM | POA: Diagnosis not present

## 2021-03-22 DIAGNOSIS — E119 Type 2 diabetes mellitus without complications: Secondary | ICD-10-CM | POA: Insufficient documentation

## 2021-03-22 DIAGNOSIS — I5042 Chronic combined systolic (congestive) and diastolic (congestive) heart failure: Secondary | ICD-10-CM

## 2021-03-22 DIAGNOSIS — Z79899 Other long term (current) drug therapy: Secondary | ICD-10-CM | POA: Insufficient documentation

## 2021-03-22 DIAGNOSIS — I5022 Chronic systolic (congestive) heart failure: Secondary | ICD-10-CM | POA: Insufficient documentation

## 2021-03-22 DIAGNOSIS — Z7901 Long term (current) use of anticoagulants: Secondary | ICD-10-CM | POA: Insufficient documentation

## 2021-03-22 DIAGNOSIS — I480 Paroxysmal atrial fibrillation: Secondary | ICD-10-CM | POA: Diagnosis not present

## 2021-03-22 DIAGNOSIS — I110 Hypertensive heart disease with heart failure: Secondary | ICD-10-CM | POA: Insufficient documentation

## 2021-03-22 DIAGNOSIS — I10 Essential (primary) hypertension: Secondary | ICD-10-CM | POA: Diagnosis not present

## 2021-03-22 DIAGNOSIS — E785 Hyperlipidemia, unspecified: Secondary | ICD-10-CM

## 2021-03-22 DIAGNOSIS — Z794 Long term (current) use of insulin: Secondary | ICD-10-CM | POA: Insufficient documentation

## 2021-03-22 DIAGNOSIS — Z888 Allergy status to other drugs, medicaments and biological substances status: Secondary | ICD-10-CM | POA: Diagnosis not present

## 2021-03-22 DIAGNOSIS — Z8249 Family history of ischemic heart disease and other diseases of the circulatory system: Secondary | ICD-10-CM | POA: Diagnosis not present

## 2021-03-22 DIAGNOSIS — Z881 Allergy status to other antibiotic agents status: Secondary | ICD-10-CM | POA: Insufficient documentation

## 2021-03-22 NOTE — Patient Instructions (Signed)
Continue weighing daily and call for an overnight weight gain of > 2 pounds or a weekly weight gain of >5 pounds. 

## 2021-03-22 NOTE — Progress Notes (Signed)
Patient ID: Bonnie Rowland, female    DOB: [birth year], 80 y.o.   MRN: [id number]  HPI  Bonnie Rowland is a 80 y/o female with a history of HTN, DM, hyperlipidemia, atrial fibrillation, GERD and chronic heart failure.   Echo report from 01/23/21 reviewed and showed an EF of 45-50% with mild LVH, severe hypokinesis of left ventricle and trivial MR.   RHC/LHC done 01/25/21 and showed: Mild to moderate, non-obstructive coronary artery disease, including 30% mid LAD disease, 50-60% mid/distal LCx stenosis, and 60% proximal/mid RCA lesion. Mildly to moderately reduced left ventricular systolic function with mid anterior hypo/akinesis; query atypical Takotsubo variant.  LVEF ~45%. Mildly elevated left heart filling pressures. Moderately elevated right heart filling and pulmonary artery pressures with significant pulmonary vascular resistance. Mildly reduced Fick cardiac output/index.  Admitted 01/23/21 due to HF exacerbation along with HTN. Initially given IV lasix and IV morphine and then transitioned to oral diuretics. Had to be placed on bipap. Cardiology consult obtained. Successfully cardioverted 01/30/21. Beta-blocker held. Elevated troponin thought to be due to demand ischemia. Discharged after 8 days.   She presents today for a follow-up visit with a chief complaint of minimal shortness of breath with moderate exertion. She describes this as having been present for several months. She has associated pedal edema and difficulty sleeping along with this. She denies any abdominal distention, palpitations, chest pain, cough, dizziness, fatigue or weight gain.   Has been trying to monitor sodium but did "cheat" yesterday and ate BBQ, baked beans and slaw.   Past Medical History:  Diagnosis Date   Acute cystitis    Acute on chronic combined systolic and diastolic CHF (congestive heart failure) (HCC)    Allergic rhinitis, cause unspecified    Arthritis 2015   Atherosclerosis of renal artery (HCC)     left   Atrial fibrillation (HCC)    Bronchitis, not specified as acute or chronic    Cancer ([place]) 12/2013   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; left shoulder blade   Cancer ([place]) 05/2014   basal cell removed left temple   Cataract 2003   Cellulitis and abscess of leg, except foot    Conjunctivitis unspecified    Dermatophytosis of nail    Diabetes mellitus    type II   Esophageal reflux    Hyperlipidemia    Hypertension    Osteoporosis 2016   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)    Renal artery stenosis (HCC)    Sprain of lumbar region    Thoracic or lumbosacral neuritis or radiculitis, unspecified    Urinary tract infection, site not specified    Past Surgical History:  Procedure Laterality Date   APPENDECTOMY     CARDIAC CATHETERIZATION     CARDIOVERSION N/A 11/30/2020   Procedure: CARDIOVERSION;  Surgeon: Minna Merritts, MD;  Location: [facility];  Service: Cardiovascular;  Laterality: N/A;   CARDIOVERSION N/A 01/20/2021   Procedure: CARDIOVERSION;  Surgeon: Minna Merritts, MD;  Location: [facility];  Service: Cardiovascular;  Laterality: N/A;   CARDIOVERSION N/A 01/30/2021   Procedure: CARDIOVERSION;  Surgeon: Wellington Hampshire, MD;  Location: [facility];  Service: Cardiovascular;  Laterality: N/A;   cataract surgery     COLONOSCOPY     COLONOSCOPY WITH PROPOFOL N/A 05/09/2016   Procedure: COLONOSCOPY WITH PROPOFOL;  Surgeon: Robert Bellow, MD;  Location: [facility];  Service: Endoscopy;  Laterality: N/A;   DIAGNOSTIC MAMMOGRAM     EYE SURGERY Bilateral    Cataract Extraction   JOINT REPLACEMENT  2016   KNEE ARTHROPLASTY Right 03/30/2015   Procedure: COMPUTER ASSISTED TOTAL KNEE ARTHROPLASTY;  Surgeon: Dereck Leep, MD;  Location: [facility];  Service: Orthopedics;  Laterality: Right;   KNEE ARTHROSCOPY Right    KNEE CLOSED REDUCTION Right 05/23/2015   Procedure: CLOSED MANIPULATION KNEE;  Surgeon: Dereck Leep, MD;  Location: [facility];  Service: Orthopedics;  Laterality: Right;    RIGHT/LEFT HEART CATH AND CORONARY ANGIOGRAPHY N/A 01/25/2021   Procedure: RIGHT/LEFT HEART CATH AND CORONARY ANGIOGRAPHY;  Surgeon: Nelva Bush, MD;  Location: [facility];  Service: Cardiovascular;  Laterality: N/A;   TONSILLECTOMY     Family History  Problem Relation Age of Onset   Diabetes Mother    Hypertension Mother    Cancer Mother    Kidney disease Mother    Hypertension Father    Cancer Father        Prostate   Breast cancer Maternal Aunt 43   Colon cancer Son    Kidney cancer Neg Hx    Bladder Cancer Neg Hx    Social History   Tobacco Use   Smoking status: Never   Smokeless tobacco: Never  Substance Use Topics   Alcohol use: Not Currently    Alcohol/week: 1.0 standard drink    Types: 1 Glasses of wine per week   Allergies  Allergen Reactions   Cyclobenzaprine Hypertension   Cheese Other (See Comments)    bloating   Ciprofloxacin Hcl Other (See Comments)    Muscle pain   Coconut Oil     Upset stomach   Keflex [Cephalexin] Other (See Comments)    Patient prefers not to take this medication due to the side effects, caused tendon issues   Prior to Admission medications   Medication Sig Start Date End Date Taking? Authorizing Provider  acetaminophen (TYLENOL) 650 MG CR tablet Take 1,300 mg by mouth at bedtime.   Yes [provider]  ALFALFA PO Take 1 tablet by mouth daily.   Yes [provider]  amiodarone (PACERONE) 200 MG tablet Take 1 tablet (200 mg total) by mouth daily. 02/10/21  Yes Dunn, Areta Haber, [name]  ascorbic Acid (VITAMIN C) 500 MG CPCR Take 500 mg by mouth daily.   Yes [provider]  B Complex-C (B-COMPLEX WITH VITAMIN C) tablet Take 1 tablet by mouth 2 (two) times daily.   Yes [provider]  BD PEN NEEDLE NANO U/F 32G X 4 MM MISC USE 4 TIMES DAILY (HUMALOG 3 TIMES DAILY AND LANTUS ONCE DAILY) OFFICE NOTIFIED 08/06/17 12/07/18  Yes Sowles, Drue Stager, MD  BLACK ELDERBERRY,BERRY-FLOWER, PO Take 15 mLs by mouth  daily. Liquid   Yes [provider]  Calcium Carbonate (CALCIUM 600 PO) Take 600 mg by mouth daily.   Yes [provider]  carvedilol (COREG) 3.125 MG tablet Take 1 tablet (3.125 mg total) by mouth 2 (two) times daily. 02/10/21 05/11/21 Yes Dunn, Areta Haber, [name]  cholecalciferol (VITAMIN D) 1000 UNITS tablet Take 1,000 Units by mouth daily. Shaklee   Yes [provider]  clobetasol ointment (TEMOVATE) 8.93 % APPLY 1 APPLICATION TOPICALLY 2 TIMES DAILY AS NEEDED (BUG BITES). 12/06/20  Yes Sowles, Drue Stager, MD  Coenzyme Q10 (COQ10) 200 MG CAPS Take 200 mg by mouth daily.   Yes [provider]  Continuous Blood Gluc Sensor ([place]) MISC by Does not apply route.   Yes [provider]  Elastic Bandages & Supports (MEDICAL COMPRESSION STOCKINGS) [place] 1 each by  Does not apply route daily. 01/08/19  Yes Sowles, Drue Stager, MD  ELIQUIS 5 MG TABS tablet TAKE 1 TABLET BY MOUTH TWICE A DAY 02/28/21  Yes Gollan, Kathlene November, MD  estradiol (ESTRACE VAGINAL) 0.1 MG/GM vaginal cream 1/2 gm once weekly using applicator, apply blueberry sized amount of cream using tip of finger to urethra twice weekly 06/10/20  Yes McGowan, Larene Beach A, [name]  Exenatide ER (BYDUREON BCISE) 2 MG/0.85ML AUIJ Inject 2 mg into the skin every Sunday. 02/26/20  Yes [provider]  furosemide (LASIX) 40 MG tablet Take 1 tablet (40 mg total) by mouth 2 (two) times daily. 02/24/21 03/26/21 Yes Elizet Kaplan A, FNP  GARLIC 3536 PO Take 1,443 mg by mouth daily.   Yes [provider]  HUMALOG KWIKPEN 100 UNIT/ML KiwkPen Inject 4-12 Units into the skin 3 (three) times daily before meals. If needed sliding scale 10/15/17  Yes [provider]  insulin glargine (LANTUS) 100 unit/mL SOPN Inject 22 Units into the skin at bedtime.   Yes [provider]  Javier Docker Oil 1000 MG CAPS Take 1,000 mg by mouth daily.   Yes [provider]  Mag Aspart-Potassium Aspart  (POTASSIUM & MAGNESIUM ASPARTAT PO) Take 1 tablet by mouth daily.   Yes [provider]  Multiple Vitamins-Minerals (PRESERVISION AREDS 2) CAPS Take 1 capsule by mouth 2 (two) times daily.   Yes [provider]  OVER THE COUNTER MEDICATION Place 1 application into both eyes See admin instructions. Blephadex eyelid foam, apply to eyelids once daily   Yes [provider]  OVER THE COUNTER MEDICATION Take 1 tablet by mouth See admin instructions. Nutriferon otc supplement take Take 1 tablet on Sun, Tues, Thurs, Sat. Take 1 tablet twice daily on Mon, Wed, and Fri   Yes [provider]  Probiotic Product (PROBIOTIC PO) Take 1 capsule by mouth at bedtime.   Yes [provider]  PROLIA 60 MG/ML SOSY injection Inject 60 mg into the skin every 6 (six) months. 09/02/19  Yes [provider]  Propylene Glycol (SYSTANE BALANCE OP) Place 1 drop into both eyes daily as needed (dry eyes).   Yes [provider]  rosuvastatin (CRESTOR) 10 MG tablet TAKE 1 TABLET (10 MG TOTAL) BY MOUTH 4 (FOUR) TIMES A WEEK. 02/21/21  Yes Sowles, Drue Stager, MD  sacubitril-valsartan (ENTRESTO) 24-26 MG Take 1 tablet by mouth 2 (two) times daily. 02/24/21  Yes Daltin Crist, Otila Kluver A, FNP  Sodium Chloride-Sodium Bicarb (NETI POT SINUS WASH NA) Place 1 Dose into the nose at bedtime.   Yes [provider]  tiZANidine (ZANAFLEX) 2 MG tablet Take 1 tablet (2 mg total) by mouth at bedtime. 01/25/21  Yes Sowles, Drue Stager, MD  Vitamin D, Ergocalciferol, (DRISDOL) 1.25 MG (50000 UT) CAPS capsule Take 50,000 Units by mouth every Saturday. 11/23/18  Yes [provider]  fluticasone (FLONASE) 50 MCG/ACT nasal spray Place 2 sprays into both nostrils daily as needed for allergies or rhinitis. Patient not taking: Reported on 03/22/2021    [provider]  Glucosamine 500 MG TABS Take 500 mg by mouth daily. Patient not taking: Reported on 03/22/2021    [provider]  OVER  THE COUNTER MEDICATION Take 1 tablet by mouth daily. Vita-Lea Gold otc supplement With out vitamin K (Shaklee products) Patient not taking: Reported on 03/22/2021    [provider]  metoprolol succinate (TOPROL-XL) 50 MG 24 hr tablet Take 1 tablet (50 mg total) by mouth 2 (two) times daily. Take with  or immediately following a meal. Patient not taking: Reported on 01/23/2021 01/16/21 01/23/21  Minna Merritts, MD    Review of Systems  Constitutional:  Negative for appetite change and fatigue.  HENT:  Negative for congestion, postnasal drip and sore throat.   Eyes: Negative.   Respiratory:  Positive for shortness of breath (mild). Negative for cough, chest tightness and wheezing.   Cardiovascular:  Positive for leg swelling (up to knees bilaterally). Negative for chest pain and palpitations.  Gastrointestinal:  Negative for abdominal distention and abdominal pain.  Endocrine: Negative.   Genitourinary: Negative.   Musculoskeletal:  Positive for back pain (chronic). Negative for neck pain.  Skin: Negative.   Allergic/Immunologic: Negative.   Neurological:  Negative for dizziness, weakness and light-headedness.  Hematological:  Negative for adenopathy. Bruises/bleeds easily.  Psychiatric/Behavioral:  Positive for sleep disturbance (sleeping in recliner due to comfort, reports she sleeps well). Negative for dysphoric mood. The patient is not nervous/anxious.    Vitals:   03/22/21 0932  BP: (!) 142/48  Pulse: (!) 56  Resp: 18  SpO2: 99%  Weight: 187 lb 8 oz (85 kg)  Height: 5\' 3"  (1.6 m)   Wt Readings from Last 3 Encounters:  03/22/21 187 lb 8 oz (85 kg)  02/24/21 188 lb (85.3 kg)  02/10/21 184 lb (83.5 kg)   Lab Results  Component Value Date   CREATININE 0.83 03/13/2021   CREATININE 0.94 02/07/2021   CREATININE 0.90 01/31/2021    Physical Exam Vitals and nursing note reviewed.  Constitutional:      Appearance: Normal appearance.  HENT:     Head: Normocephalic and  atraumatic.  Cardiovascular:     Rate and Rhythm: Normal rate and regular rhythm. S1, S2.  Pulmonary:     Effort: Pulmonary effort is normal. No respiratory distress.     Breath sounds: No wheezing or rales.  Abdominal:     General: Abdomen is flat. There is no distension.     Palpations: Abdomen is soft.  Musculoskeletal:        General: No tenderness.     Cervical back: Normal range of motion and neck supple.     Right lower leg: Edema +1  present to knee.     Left lower leg: Edema +1 present to knee.  Skin:    General: Skin is warm and dry.  Neurological:     General: No focal deficit present.     Mental Status: She is alert and oriented to person, place, and time.  Psychiatric:        Mood and Affect: Mood normal.        Behavior: Behavior normal.        Thought Content: Thought content normal.      Assessment & Plan:  1: Chronic heart failure with mildly reduced ejection fraction- - NYHA class II - euvolemic today - weighing daily; reminded to call for an overnight weight gain of > 2 pounds or a weekly weight gain of > 5 pounds.  - weight stable from last visit 1 month ago - not adding salt and has been reading food labels; did eat higher sodium foods yesterday with BBQ, slaw and baked beans - on GDMT of entresto & carvedilol - got approved through [employer] patient assistance for entresto - has upcoming echo scheduled on 03/31/21; further med titration pending results - wearing compression socks daily and removes HS - scheduled to get her flu vaccine on 04/13/21 - BNP 01/22/21 was 237.3  2: HTN- - BP mildly elevated today (142/48) - saw PCP Ancil Boozer) 02/09/21 - BMP 03/13/21 reviewed and showed sodium 139, potassium 3.7, creatinine 0.83 & GFR >60  3: DM- - A1c 01/09/21 was 6.5% - glucose at home today was 120  4: Paroxysmal atrial fibrillation- - successfully cardioverted 01/30/21 - saw cardiology (Dunn) 02/10/21; returns 04/12/21 - filled out [employer] patient assistance  forms today   Medication bottles reviewed.   Return in 6 weeks or sooner for any questions/problems before then.

## 2021-03-23 DIAGNOSIS — E1159 Type 2 diabetes mellitus with other circulatory complications: Secondary | ICD-10-CM | POA: Diagnosis not present

## 2021-03-23 DIAGNOSIS — M9903 Segmental and somatic dysfunction of lumbar region: Secondary | ICD-10-CM | POA: Diagnosis not present

## 2021-03-23 DIAGNOSIS — E1169 Type 2 diabetes mellitus with other specified complication: Secondary | ICD-10-CM | POA: Diagnosis not present

## 2021-03-23 DIAGNOSIS — E1165 Type 2 diabetes mellitus with hyperglycemia: Secondary | ICD-10-CM | POA: Diagnosis not present

## 2021-03-23 DIAGNOSIS — Z794 Long term (current) use of insulin: Secondary | ICD-10-CM | POA: Diagnosis not present

## 2021-03-23 DIAGNOSIS — E785 Hyperlipidemia, unspecified: Secondary | ICD-10-CM | POA: Diagnosis not present

## 2021-03-23 DIAGNOSIS — M5033 Other cervical disc degeneration, cervicothoracic region: Secondary | ICD-10-CM | POA: Diagnosis not present

## 2021-03-23 DIAGNOSIS — M5416 Radiculopathy, lumbar region: Secondary | ICD-10-CM | POA: Diagnosis not present

## 2021-03-23 DIAGNOSIS — I152 Hypertension secondary to endocrine disorders: Secondary | ICD-10-CM | POA: Diagnosis not present

## 2021-03-23 DIAGNOSIS — M9901 Segmental and somatic dysfunction of cervical region: Secondary | ICD-10-CM | POA: Diagnosis not present

## 2021-03-23 LAB — HEMOGLOBIN A1C: Hemoglobin A1C: 7.1

## 2021-03-24 ENCOUNTER — Telehealth: Payer: Self-pay

## 2021-03-24 DIAGNOSIS — J9601 Acute respiratory failure with hypoxia: Secondary | ICD-10-CM | POA: Diagnosis not present

## 2021-03-24 DIAGNOSIS — I5043 Acute on chronic combined systolic (congestive) and diastolic (congestive) heart failure: Secondary | ICD-10-CM | POA: Diagnosis not present

## 2021-03-24 DIAGNOSIS — I2510 Atherosclerotic heart disease of native coronary artery without angina pectoris: Secondary | ICD-10-CM | POA: Diagnosis not present

## 2021-03-24 DIAGNOSIS — I110 Hypertensive heart disease with heart failure: Secondary | ICD-10-CM | POA: Diagnosis not present

## 2021-03-24 DIAGNOSIS — I480 Paroxysmal atrial fibrillation: Secondary | ICD-10-CM | POA: Diagnosis not present

## 2021-03-24 DIAGNOSIS — I701 Atherosclerosis of renal artery: Secondary | ICD-10-CM | POA: Diagnosis not present

## 2021-03-24 NOTE — Progress Notes (Signed)
Chronic Care Management Pharmacy Assistant   Name: Bonnie Rowland  MRN: [id number] DOB: April 25, 1941  Reason for Encounter: Hypertension/CHF  Disease State Call  Recent office visits:  None ID  Recent consult visits:  03/23/2021 [name], MD (Endocrinology) for Follow-up- No medication changes noted; patient instructed to follow-up in 6 months.   03/22/2021 [name], [name] (Cardiology Office Visit)- for CHF- No medication changes noted; no orders placed; no follow-up noted  02/24/2021 [name], [name] (Cardiology Office Visit) for CHF- Started Sacubitril-Valsartan 24-26 mg 1 tablet twice daily; Increased Furosemide 40 mg to twice daily, Discontinued Losartan Potassium 25 mg. Patient is on PAP for Entresto until the end of the year that was started by Cardio's office.   08/19/202 [name], [name] (Cardiology Phone Call) Patient called in due to increased swelling in her ankles and some weight gain; Provider instructed patient to take additional 40 mg Lasix over the next few days (2-3 days) and if swelling persists to call provider back for an appointment.   02/10/2021 [name], [name] (Cardiology Office Visit)- for Hospitalization follow-up- Started Carvedilol 3.125 mg twice daily, Amiodrone HCl 200 mg decreased from twice daily to once daily; Fluticasone Propionate changed to 2 sprays into each nostril daily; Losartan Potassium increased to 25 mg daily. EKG and Echocardiogram Complete ordered; patient instructed to follow-up in 8 weeks  Hospital visits:  None in previous 6 months  Medications: Outpatient Encounter Medications as of 03/24/2021  Medication Sig Note   acetaminophen (TYLENOL) 650 MG CR tablet Take 1,300 mg by mouth at bedtime.    ALFALFA PO Take 1 tablet by mouth daily.    amiodarone (PACERONE) 200 MG tablet Take 1 tablet (200 mg total) by mouth daily.    ascorbic Acid (VITAMIN C) 500 MG CPCR Take 500 mg by mouth daily.    B Complex-C (B-COMPLEX WITH  VITAMIN C) tablet Take 1 tablet by mouth 2 (two) times daily.    BD PEN NEEDLE NANO U/F 32G X 4 MM MISC USE 4 TIMES DAILY (HUMALOG 3 TIMES DAILY AND LANTUS ONCE DAILY) OFFICE NOTIFIED 08/06/17    BLACK ELDERBERRY,BERRY-FLOWER, PO Take 15 mLs by mouth daily. Liquid    Calcium Carbonate (CALCIUM 600 PO) Take 600 mg by mouth daily.    carvedilol (COREG) 3.125 MG tablet Take 1 tablet (3.125 mg total) by mouth 2 (two) times daily.    cholecalciferol (VITAMIN D) 1000 UNITS tablet Take 1,000 Units by mouth daily. Shaklee    clobetasol ointment (TEMOVATE) 4.09 % APPLY 1 APPLICATION TOPICALLY 2 TIMES DAILY AS NEEDED (BUG BITES).    Coenzyme Q10 (COQ10) 200 MG CAPS Take 200 mg by mouth daily.    Continuous Blood Gluc Sensor ([place]) MISC by Does not apply route.    Elastic Bandages & Supports (MEDICAL COMPRESSION STOCKINGS) MISC 1 each by Does not apply route daily.    ELIQUIS 5 MG TABS tablet TAKE 1 TABLET BY MOUTH TWICE A DAY    estradiol (ESTRACE VAGINAL) 0.1 MG/GM vaginal cream 1/2 gm once weekly using applicator, apply blueberry sized amount of cream using tip of finger to urethra twice weekly    Exenatide ER (BYDUREON BCISE) 2 MG/0.85ML AUIJ Inject 2 mg into the skin every Sunday.    fluticasone (FLONASE) 50 MCG/ACT nasal spray Place 2 sprays into both nostrils daily as needed for allergies or rhinitis. (Patient not taking: Reported on 03/22/2021)    furosemide (LASIX) 40 MG tablet Take 1 tablet (40  mg total) by mouth 2 (two) times daily.    GARLIC 1610 PO Take 9,604 mg by mouth daily.    Glucosamine 500 MG TABS Take 500 mg by mouth daily. (Patient not taking: Reported on 03/22/2021)    HUMALOG KWIKPEN 100 UNIT/ML KiwkPen Inject 4-12 Units into the skin 3 (three) times daily before meals. If needed sliding scale    insulin glargine (LANTUS) 100 unit/mL SOPN Inject 22 Units into the skin at bedtime.    Krill Oil 1000 MG CAPS Take 1,000 mg by mouth daily.    Mag Aspart-Potassium  Aspart (POTASSIUM & MAGNESIUM ASPARTAT PO) Take 1 tablet by mouth daily.    Multiple Vitamins-Minerals (PRESERVISION AREDS 2) CAPS Take 1 capsule by mouth 2 (two) times daily.    OVER THE COUNTER MEDICATION Place 1 application into both eyes See admin instructions. Blephadex eyelid foam, apply to eyelids once daily    OVER THE COUNTER MEDICATION Take 1 tablet by mouth daily. Vita-Lea Gold otc supplement With out vitamin K (Shaklee products) (Patient not taking: Reported on 03/22/2021)    OVER THE COUNTER MEDICATION Take 1 tablet by mouth See admin instructions. Nutriferon otc supplement take Take 1 tablet on Sun, Tues, Thurs, Sat. Take 1 tablet twice daily on Mon, Wed, and Fri    Probiotic Product (PROBIOTIC PO) Take 1 capsule by mouth at bedtime.    PROLIA 60 MG/ML SOSY injection Inject 60 mg into the skin every 6 (six) months.    Propylene Glycol (SYSTANE BALANCE OP) Place 1 drop into both eyes daily as needed (dry eyes).    rosuvastatin (CRESTOR) 10 MG tablet TAKE 1 TABLET (10 MG TOTAL) BY MOUTH 4 (FOUR) TIMES A WEEK. 03/22/2021: Takes every night now   sacubitril-valsartan (ENTRESTO) 24-26 MG Take 1 tablet by mouth 2 (two) times daily.    Sodium Chloride-Sodium Bicarb (NETI POT SINUS WASH NA) Place 1 Dose into the nose at bedtime.    tiZANidine (ZANAFLEX) 2 MG tablet Take 1 tablet (2 mg total) by mouth at bedtime.    Vitamin D, Ergocalciferol, (DRISDOL) 1.25 MG (50000 UT) CAPS capsule Take 50,000 Units by mouth every Saturday.    [DISCONTINUED] metoprolol succinate (TOPROL-XL) 50 MG 24 hr tablet Take 1 tablet (50 mg total) by mouth 2 (two) times daily. Take with or immediately following a meal. (Patient not taking: Reported on 01/23/2021)    No facility-administered encounter medications on file as of 03/24/2021.   Care Gaps: Influenza Vaccine   Star Rating Drugs: Sacubitril-Valsartan 24-26 mg last filled on 02/25/2021 for a 30-Day supply with [facility]. According to Cardiology note they  completed a PAP for this medication and the patient was approved until the end of the year.  Rosuvastatin 10 mg last filled on 01/16/2021 for a 90-Day supply with [facility]  Reviewed chart prior to disease state call. Spoke with patient regarding BP  Recent Office Vitals: BP Readings from Last 3 Encounters:  03/22/21 (!) 142/48  02/24/21 (!) 169/60  02/10/21 (!) 170/80   Pulse Readings from Last 3 Encounters:  03/22/21 (!) 56  02/24/21 63  02/10/21 70    Wt Readings from Last 3 Encounters:  03/22/21 187 lb 8 oz (85 kg)  02/24/21 188 lb (85.3 kg)  02/10/21 184 lb (83.5 kg)     Kidney Function Lab Results  Component Value Date/Time   CREATININE 0.83 03/13/2021 10:42 AM   CREATININE 0.94 02/07/2021 08:34 AM   CREATININE 0.71 01/09/2021 08:36 AM   CREATININE 0.63  04/11/2018 10:32 AM   GFRNONAA >60 03/13/2021 10:42 AM   GFRNONAA 88 04/11/2018 10:32 AM   GFRAA 102 04/11/2018 10:32 AM    BMP Latest Ref Rng & Units 03/13/2021 02/07/2021 01/31/2021  Glucose 70 - 99 mg/dL 171(H) 139(H) 142(H)  BUN 8 - 23 mg/dL 16 14 26(H)  Creatinine 0.44 - 1.00 mg/dL 0.83 0.94 0.90  BUN/Creat Ratio 12 - 28 - - -  Sodium 135 - 145 mmol/L 139 132(L) 133(L)  Potassium 3.5 - 5.1 mmol/L 3.7 4.3 5.0  Chloride 98 - 111 mmol/L 102 93(L) 98  CO2 22 - 32 mmol/L _0 Calcium 8.9 - 10.3 mg/dL 8.9 8.9 8.0(L)    Current antihypertensive regimen:  Entresto 24-26 mg 1 tablet twice daily Furosemide 40 mg 1 tablet twice daily  Coreg 3.125 mg 1 tablet twice daily  How often are you checking your Blood Pressure? daily  Current home BP readings: 140/55 HR 60, 127/52, 118/60, 118/62, 130/62, 120/65, 130/70, 122/60 HR 64  What recent interventions/DTPs have been made by any provider to improve Blood Pressure control since last CPP Visit: Yes patient was discontinued from Losartan 25 mg and started on Entresto 24-26 mg twice daily, Furosemide 40 mg was increased to twice daily   Any recent hospitalizations  or ED visits since last visit with CPP? No  What diet changes have been made to improve Blood Pressure Control?  Patient reports that she is doing her best to follow a low salt diet and to monitor her fluid intake. She said so far she is doing really well. She reports that she may have a cheat day once a month to eat something that she really likes but she does not go over board.   What exercise is being done to improve your Blood Pressure Control?  Patient reports that she was doing in home PT, and she just completed that on yesterday, and she also exercises and does Tai Chi. She feels much better and her symptoms are improving.   Adherence Review: Is the patient currently on ACE/ARB medication? Yes Does the patient have >5 day gap between last estimated fill dates? No  Patient reports that she is taking all medications as directed. She so far has not experienced any side effects from the [facility] that she is aware of. She reports that she still has a little edema, but she is taking the Furosemide 40 mg and she is aware that she can take it twice daily if needed. She reports that her weight right now is maintaining around 184/185. She has plenty of support with our team and her heart failure team and she is very grateful. Patient has no concerns or issues at this time, and she does have my direct number just in case she needs anything.    Patient has scheduled appointment with Junius Argyle, CPP on 05/17/2021 @ [place], CPA/CMA Catering manager Phone: [phone]

## 2021-03-27 ENCOUNTER — Ambulatory Visit: Payer: Medicare Other | Admitting: Podiatry

## 2021-03-27 DIAGNOSIS — I701 Atherosclerosis of renal artery: Secondary | ICD-10-CM | POA: Diagnosis not present

## 2021-03-27 DIAGNOSIS — I480 Paroxysmal atrial fibrillation: Secondary | ICD-10-CM | POA: Diagnosis not present

## 2021-03-27 DIAGNOSIS — I110 Hypertensive heart disease with heart failure: Secondary | ICD-10-CM | POA: Diagnosis not present

## 2021-03-27 DIAGNOSIS — I5043 Acute on chronic combined systolic (congestive) and diastolic (congestive) heart failure: Secondary | ICD-10-CM | POA: Diagnosis not present

## 2021-03-27 DIAGNOSIS — I2510 Atherosclerotic heart disease of native coronary artery without angina pectoris: Secondary | ICD-10-CM | POA: Diagnosis not present

## 2021-03-27 DIAGNOSIS — J9601 Acute respiratory failure with hypoxia: Secondary | ICD-10-CM | POA: Diagnosis not present

## 2021-03-28 DIAGNOSIS — M810 Age-related osteoporosis without current pathological fracture: Secondary | ICD-10-CM | POA: Diagnosis not present

## 2021-03-31 ENCOUNTER — Ambulatory Visit (INDEPENDENT_AMBULATORY_CARE_PROVIDER_SITE_OTHER): Payer: Medicare Other

## 2021-03-31 ENCOUNTER — Other Ambulatory Visit: Payer: Self-pay

## 2021-03-31 DIAGNOSIS — I428 Other cardiomyopathies: Secondary | ICD-10-CM | POA: Diagnosis not present

## 2021-03-31 LAB — ECHOCARDIOGRAM LIMITED
Area-P 1/2: 5.34 cm2
S' Lateral: 2.6 cm

## 2021-03-31 MED ORDER — PERFLUTREN LIPID MICROSPHERE
1.0000 mL | INTRAVENOUS | Status: AC | PRN
  Administered 2021-03-31: 2 mL via INTRAVENOUS

## 2021-04-06 DIAGNOSIS — M9901 Segmental and somatic dysfunction of cervical region: Secondary | ICD-10-CM | POA: Diagnosis not present

## 2021-04-06 DIAGNOSIS — M5033 Other cervical disc degeneration, cervicothoracic region: Secondary | ICD-10-CM | POA: Diagnosis not present

## 2021-04-06 DIAGNOSIS — M5416 Radiculopathy, lumbar region: Secondary | ICD-10-CM | POA: Diagnosis not present

## 2021-04-06 DIAGNOSIS — M9903 Segmental and somatic dysfunction of lumbar region: Secondary | ICD-10-CM | POA: Diagnosis not present

## 2021-04-11 ENCOUNTER — Ambulatory Visit: Payer: Medicare Other | Admitting: Family

## 2021-04-12 ENCOUNTER — Other Ambulatory Visit: Payer: Self-pay

## 2021-04-12 ENCOUNTER — Encounter: Payer: Self-pay | Admitting: Physician Assistant

## 2021-04-12 ENCOUNTER — Other Ambulatory Visit: Payer: Self-pay | Admitting: Physician Assistant

## 2021-04-12 ENCOUNTER — Telehealth: Payer: Self-pay | Admitting: Cardiovascular Disease

## 2021-04-12 ENCOUNTER — Ambulatory Visit (INDEPENDENT_AMBULATORY_CARE_PROVIDER_SITE_OTHER): Payer: Medicare Other | Admitting: Physician Assistant

## 2021-04-12 VITALS — BP 142/70 | HR 66 | Ht 63.0 in | Wt 187.2 lb

## 2021-04-12 DIAGNOSIS — I4819 Other persistent atrial fibrillation: Secondary | ICD-10-CM

## 2021-04-12 DIAGNOSIS — I10 Essential (primary) hypertension: Secondary | ICD-10-CM | POA: Diagnosis not present

## 2021-04-12 DIAGNOSIS — E785 Hyperlipidemia, unspecified: Secondary | ICD-10-CM | POA: Diagnosis not present

## 2021-04-12 DIAGNOSIS — I272 Pulmonary hypertension, unspecified: Secondary | ICD-10-CM

## 2021-04-12 DIAGNOSIS — I5042 Chronic combined systolic (congestive) and diastolic (congestive) heart failure: Secondary | ICD-10-CM | POA: Diagnosis not present

## 2021-04-12 DIAGNOSIS — I701 Atherosclerosis of renal artery: Secondary | ICD-10-CM

## 2021-04-12 DIAGNOSIS — I428 Other cardiomyopathies: Secondary | ICD-10-CM | POA: Diagnosis not present

## 2021-04-12 DIAGNOSIS — I2510 Atherosclerotic heart disease of native coronary artery without angina pectoris: Secondary | ICD-10-CM | POA: Diagnosis not present

## 2021-04-12 MED ORDER — AMIODARONE HCL 100 MG PO TABS: 100.0000 mg | ORAL_TABLET | Freq: Every day | ORAL | 0 refills | Status: DC

## 2021-04-12 MED ORDER — AMIODARONE HCL 200 MG PO TABS: 100.0000 mg | ORAL_TABLET | Freq: Every day | ORAL | 3 refills | Status: DC

## 2021-04-12 NOTE — Telephone Encounter (Signed)
Called the patient. Advised her that [place] lab drawn today will include sodium levels. Patient verbalized understanding and voiced appreciation for the call back.

## 2021-04-12 NOTE — Patient Instructions (Signed)
Medication Instructions:   DECREASE your Amiodarone to 100 MG once a day.  *If you need a refill on your cardiac medications before your next appointment, please call your pharmacy*   Lab Work:  A CMET and TSH drawn in office today.  If you have labs (blood work) drawn today and your tests are completely normal, you will receive your results only by: [place] (if you have MyChart) OR A paper copy in the mail If you have any lab test that is abnormal or we need to change your treatment, we will call you to review the results.   Testing/Procedures: None Ordered   Follow-Up: At [facility], you and your health needs are our priority.  As part of our continuing mission to provide you with exceptional heart care, we have created designated Provider Care Teams.  These Care Teams include your primary Cardiologist (physician) and Advanced Practice Providers (APPs -  Physician Assistants and Nurse Practitioners) who all work together to provide you with the care you need, when you need it.  We recommend signing up for the patient portal called "MyChart".  Sign up information is provided on this After Visit Summary.  MyChart is used to connect with patients for Virtual Visits (Telemedicine).  Patients are able to view lab/test results, encounter notes, upcoming appointments, etc.  Non-urgent messages can be sent to your provider as well.   To learn more about what you can do with MyChart, go to [email or web address].    Your next appointment:   3 month(s)  The format for your next appointment:   In Person  Provider:   You may see Ida Rogue, MD or one of the following Advanced Practice Providers on your designated Care Team:   Murray Hodgkins, NP [name], [name] Marrianne Mood, [name] Cadence Kathlen Mody, [place]   Other Instructions

## 2021-04-12 NOTE — Progress Notes (Signed)
Cardiology Office Note    Date:  04/12/2021   ID:  Bonnie Rowland 1941-06-17, MRN [id number]  PCP:  Steele Sizer, MD  Cardiologist:  Ida Rogue, MD  Electrophysiologist:  None   Chief Complaint: Follow-up  History of Present Illness:   Bonnie Rowland is a 80 y.o. female with history of nonobstructive CAD by [place] in 12/2020, chronic combined systolic and diastolic CHF, NICM with subsequent normalization of LV systolic function by echo in 03/2021, persistent A. Fib status post DCCV on 11/30/2020 with recurrent Afib s/p repeat DCCV on amiodarone 01/20/2021 s/p repeat DCCV on 01/30/2021, pulmonary hypertension, postoperative atrial flutter in 01/2015, less than 50% left renal artery stenosis by imaging in 2021, 1 to 39% bilateral ICA carotid stenosis by imaging in 2017, DM2, HTN, HLD, and obesity who presents for follow up of recent echo.   She underwent LHC in 09/2005 which showed normal coronary arteries.  She was diagnosed with atrial flutter with RVR in 01/2015 after presenting for a total right knee replacement with spontaneous conversion to sinus rhythm.  She had been maintained on flecainide briefly.  More recently, she was seen in 08/2020 for routine 28-month follow-up and noted to be in asymptomatic A. fib with titration of metoprolol and addition of Eliquis at that time.  Echo in 09/2020 demonstrated an EF of 60 to 65%, no regional wall motion abnormalities, indeterminate LV diastolic function parameters, normal RV systolic function and ventricular cavity size, normal PASP, mildly dilated left atrium, and mild mitral regurgitation.  She subsequently underwent successful DCCV at 150 J and 200 J on 11/30/2020 with conversion to sinus rhythm.  When she was seen in follow up on 12/22/2020, she was noted to be back in Afib with controlled ventricular response.  In this setting, she was loaded with amiodarone and subsequently underwent repeat successful DCCV on 01/20/2021.     She was admitted  to [facility] from 7/25 through 8/2 with respiratory failure and pulmonary edema in the setting of new LV dysfunction and recurrent A. fib.  High-sensitivity troponin peaked at 692.  BNP 237.  Echo showed an EF of 45 to 50%, mild LVH, severe hypokinesis of the entire anterior and anteroseptal wall, moderately reduced RV systolic function with normal RV cavity size and mildly increased RV wall thickness, degenerative mitral valve with trivial regurgitation.  R/LHC showed mild to moderate, nonobstructive CAD including 30% mid LAD stenosis, 50 to 60% mid to distal LCx stenosis, and 60% proximal to mid RCA stenosis.  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  Moderately elevated right heart filling and pulmonary artery pressures with significant pulmonary vascular resistance and mildly reduced cardiac output and index.  She was diuresed, placed on tolerated GDMT, and underwent successful repeat DCCV with symptomatic improvement.    She was seen in hospital follow-up on 02/10/2021 and was doing well from a cardiac perspective.  She did continue to note some fatigue, though was working with PT.  Her weight remained stable around 181 pounds.  She was maintaining sinus rhythm on amiodarone.  She was started on carvedilol 3.125 mg twice daily and continued on losartan.  She contacted our office on 8/19 noting increased weight gain and was advised to take an additional Lasix 40 mg over the next several days.  She was subsequently seen by the [facility] CHF clinic on 02/24/2021 with discontinuation of losartan  and initiation of Entresto.  Lasix was also titrated to 40 mg twice daily.  Felt to be euvolemic.  Weight 188 pounds.  Repeat limited echo on 03/31/2021 to evaluate her EF on GDMT showed normalization of LV systolic function with an EF of 60 to 65%, no regional wall motion abnormalities,  indeterminate LV diastolic function parameters, normal RV systolic function and ventricular cavity size, moderate mitral annular calcification, and an estimated right atrial pressure of 3 mmHg.  She comes in doing very well from a cardiac perspective.  No angina, dyspnea, palpitations, dizziness, presyncope, or syncope.  She does continue to note some lower extremity swelling which typically improves with leg elevation.  She is wearing compression stockings.  She has tolerated titration of Lasix and addition of Entresto without issues.  She indicates that she is feeling significantly better than how she was in the summer 2022.  She is now back to doing her daily walking without limitation.  Her weight remains stable at home around 184 to 185 pounds.  She does note the development of a mild tremor and some hair loss.  She is tolerating apixaban without issues.  No falls, hematochezia, or melena.  She is pleased with her improvement.      Labs independently reviewed: 03/2021 - A1c 7.1, potassium 3.7, BUN 16, serum creatinine 0.83 01/2021 - Hgb 13.9, PLT 285, magnesium 2.0 12/2020 - albumin 4.0, AST 42, ALT normal, TSH 4.83 07/2020 - TC 130, TG 86, HDL 75, LDL 38    Past Medical History:  Diagnosis Date   Acute cystitis    Acute on chronic combined systolic and diastolic CHF (congestive heart failure) (HCC)    Allergic rhinitis, cause unspecified    Arthritis 2015   Atherosclerosis of renal artery (HCC)    left   Atrial fibrillation (HCC)    Bronchitis, not specified as acute or chronic    Cancer ([place]) 12/2013   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; left shoulder blade   Cancer ([place]) 05/2014   basal cell removed left temple   Cataract 2003   Cellulitis and abscess of leg, except foot    Conjunctivitis unspecified    Dermatophytosis of nail    Diabetes mellitus    type II   Esophageal reflux    Hyperlipidemia    Hypertension    Osteoporosis 2016   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)    Renal artery stenosis  (HCC)    Sprain of lumbar region    Thoracic or lumbosacral neuritis or radiculitis, unspecified    Urinary tract infection, site not specified     Past Surgical History:  Procedure Laterality Date   APPENDECTOMY     CARDIAC CATHETERIZATION     CARDIOVERSION N/A 11/30/2020   Procedure: CARDIOVERSION;  Surgeon: Minna Merritts, MD;  Location: [facility] ORS;  Service: Cardiovascular;  Laterality: N/A;   CARDIOVERSION N/A 01/20/2021   Procedure: CARDIOVERSION;  Surgeon: Minna Merritts, MD;  Location: [facility] ORS;  Service: Cardiovascular;  Laterality: N/A;   CARDIOVERSION N/A 01/30/2021   Procedure: CARDIOVERSION;  Surgeon: Wellington Hampshire, MD;  Location: [facility] ORS;  Service: Cardiovascular;  Laterality: N/A;   cataract surgery     COLONOSCOPY     COLONOSCOPY WITH PROPOFOL N/A 05/09/2016   Procedure: COLONOSCOPY WITH PROPOFOL;  Surgeon: Robert Bellow, MD;  Location: [facility] ENDOSCOPY;  Service: Endoscopy;  Laterality: N/A;   DIAGNOSTIC MAMMOGRAM     EYE SURGERY Bilateral    Cataract Extraction   JOINT REPLACEMENT  2016  KNEE ARTHROPLASTY Right 03/30/2015   Procedure: COMPUTER ASSISTED TOTAL KNEE ARTHROPLASTY;  Surgeon: Dereck Leep, MD;  Location: [facility] ORS;  Service: Orthopedics;  Laterality: Right;   KNEE ARTHROSCOPY Right    KNEE CLOSED REDUCTION Right 05/23/2015   Procedure: CLOSED MANIPULATION KNEE;  Surgeon: Dereck Leep, MD;  Location: [facility] ORS;  Service: Orthopedics;  Laterality: Right;   RIGHT/LEFT HEART CATH AND CORONARY ANGIOGRAPHY N/A 01/25/2021   Procedure: RIGHT/LEFT HEART CATH AND CORONARY ANGIOGRAPHY;  Surgeon: Nelva Bush, MD;  Location: [facility];  Service: Cardiovascular;  Laterality: N/A;   TONSILLECTOMY      Current Medications: Current Meds  Medication Sig   acetaminophen (TYLENOL) 650 MG CR tablet Take 1,300 mg by mouth at bedtime.   ALFALFA PO Take 1 tablet by mouth daily.   amiodarone (PACERONE) 200 MG tablet Take 0.5 tablets (100 mg total) by  mouth daily.   ascorbic Acid (VITAMIN C) 500 MG CPCR Take 500 mg by mouth daily.   B Complex-C (B-COMPLEX WITH VITAMIN C) tablet Take 1 tablet by mouth 2 (two) times daily.   BD PEN NEEDLE NANO U/F 32G X 4 MM MISC USE 4 TIMES DAILY (HUMALOG 3 TIMES DAILY AND LANTUS ONCE DAILY) OFFICE NOTIFIED 08/06/17   BLACK ELDERBERRY,BERRY-FLOWER, PO Take 15 mLs by mouth daily. Liquid   Calcium Carbonate (CALCIUM 600 PO) Take 600 mg by mouth daily.   carvedilol (COREG) 3.125 MG tablet Take 1 tablet (3.125 mg total) by mouth 2 (two) times daily.   cholecalciferol (VITAMIN D) 1000 UNITS tablet Take 1,000 Units by mouth daily. [place]   clobetasol ointment (TEMOVATE) 0.86 % APPLY 1 APPLICATION TOPICALLY 2 TIMES DAILY AS NEEDED (BUG BITES).   Coenzyme Q10 (COQ10) 200 MG CAPS Take 200 mg by mouth daily.   Continuous Blood Gluc Sensor ([place]) MISC by Does not apply route.   Elastic Bandages & Supports (MEDICAL COMPRESSION STOCKINGS) MISC 1 each by Does not apply route daily.   ELIQUIS 5 MG TABS tablet TAKE 1 TABLET BY MOUTH TWICE A DAY   estradiol (ESTRACE VAGINAL) 0.1 MG/GM vaginal cream 1/2 gm once weekly using applicator, apply blueberry sized amount of cream using tip of finger to urethra twice weekly   Exenatide ER (BYDUREON BCISE) 2 MG/0.85ML AUIJ Inject 2 mg into the skin every Sunday.   fluticasone (FLONASE) 50 MCG/ACT nasal spray Place 2 sprays into both nostrils daily as needed for allergies or rhinitis.   furosemide (LASIX) 40 MG tablet Take 1 tablet (40 mg total) by mouth 2 (two) times daily.   GARLIC 7619 PO Take 5,093 mg by mouth daily.   Glucosamine 500 MG TABS Take 500 mg by mouth daily.   HUMALOG KWIKPEN 100 UNIT/ML KiwkPen Inject 4-12 Units into the skin 3 (three) times daily before meals. If needed sliding scale   insulin glargine (LANTUS) 100 unit/mL SOPN Inject 22 Units into the skin at bedtime.   Krill Oil 1000 MG CAPS Take 1,000 mg by mouth daily.   Mag Aspart-Potassium  Aspart (POTASSIUM & MAGNESIUM ASPARTAT PO) Take 1 tablet by mouth daily.   Multiple Vitamins-Minerals (PRESERVISION AREDS 2) CAPS Take 1 capsule by mouth 2 (two) times daily.   OVER THE COUNTER MEDICATION Place 1 application into both eyes See admin instructions. Blephadex eyelid foam, apply to eyelids once daily   OVER THE COUNTER MEDICATION Take 1 tablet by mouth daily. Vita-Lea Gold otc supplement With out vitamin K ([place] products)   OVER THE  COUNTER MEDICATION Take 1 tablet by mouth See admin instructions. Nutriferon otc supplement take Take 1 tablet on Sun, Tues, Thurs, Sat. Take 1 tablet twice daily on Mon, Wed, and Fri   Probiotic Product (PROBIOTIC PO) Take 1 capsule by mouth at bedtime.   PROLIA 60 MG/ML SOSY injection Inject 60 mg into the skin every 6 (six) months.   Propylene Glycol (SYSTANE BALANCE OP) Place 1 drop into both eyes daily as needed (dry eyes).   rosuvastatin (CRESTOR) 10 MG tablet TAKE 1 TABLET (10 MG TOTAL) BY MOUTH 4 (FOUR) TIMES A WEEK.   sacubitril-valsartan (ENTRESTO) 24-26 MG Take 1 tablet by mouth 2 (two) times daily.   Sodium Chloride-Sodium Bicarb (NETI POT SINUS WASH NA) Place 1 Dose into the nose at bedtime.   tiZANidine (ZANAFLEX) 2 MG tablet Take 1 tablet (2 mg total) by mouth at bedtime.   Vitamin D, Ergocalciferol, (DRISDOL) 1.25 MG (50000 UT) CAPS capsule Take 50,000 Units by mouth every Saturday.   [DISCONTINUED] amiodarone (PACERONE) 200 MG tablet Take 1 tablet (200 mg total) by mouth daily.    Allergies:   Cyclobenzaprine, Cheese, Ciprofloxacin hcl, Coconut oil, and Keflex [cephalexin]   Social History   Socioeconomic History   Marital status: Married    Spouse name: Emergency planning/management officer   Number of children: 1   Years of education: Not on file   Highest education level: Master's degree (e.g., MA, MS, MEng, MEd, MSW, MBA)  Occupational History   Occupation: Retired  Tobacco Use   Smoking status: Never   Smokeless tobacco: Never  Vaping Use   Vaping  Use: Never used  Substance and Sexual Activity   Alcohol use: Not Currently    Alcohol/week: 1.0 standard drink    Types: 1 Glasses of wine per week   Drug use: No   Sexual activity: Yes    Partners: Male    Birth control/protection: Post-menopausal  Other Topics Concern   Not on file  Social History Narrative   She is married , only has one son and he was diagnosed with colon cancer at age 103.    Social Determinants of Health   Financial Resource Strain: Low Risk    Difficulty of Paying Living Expenses: Not hard at all  Food Insecurity: No Food Insecurity   Worried About Charity fundraiser in the Last Year: Never true   [place] in the Last Year: Never true  Transportation Needs: No Transportation Needs   Lack of Transportation (Medical): No   Lack of Transportation (Non-Medical): No  Physical Activity: Sufficiently Active   Days of Exercise per Week: 7 days   Minutes of Exercise per Session: 30 min  Stress: No Stress Concern Present   Feeling of Stress : Not at all  Social Connections: Socially Integrated   Frequency of Communication with Friends and Family: More than three times a week   Frequency of Social Gatherings with Friends and Family: More than three times a week   Attends Religious Services: More than 4 times per year   Active Member of [employer] or Organizations: Yes   Attends Music therapist: More than 4 times per year   Marital Status: Married     Family History:  The patient's family history includes Breast cancer (age of onset: 54) in her maternal aunt; Cancer in her father and mother; Colon cancer in her son; Diabetes in her mother; Hypertension in her father and mother; Kidney disease in her mother. There is  no history of Kidney cancer or Bladder Cancer.  ROS:   Review of Systems  Constitutional:  Negative for chills, diaphoresis, fever, malaise/fatigue and weight loss.  HENT:  Negative for congestion.   Eyes:  Negative for discharge  and redness.  Respiratory:  Negative for cough, sputum production, shortness of breath and wheezing.   Cardiovascular:  Negative for chest pain, palpitations, orthopnea, claudication, leg swelling and PND.  Gastrointestinal:  Negative for abdominal pain, blood in stool, heartburn, melena, nausea and vomiting.  Musculoskeletal:  Negative for falls and myalgias.  Skin:  Negative for rash.  Neurological:  Positive for tremors. Negative for dizziness, tingling, sensory change, speech change, focal weakness, loss of consciousness and weakness.  Endo/Heme/Allergies:  Does not bruise/bleed easily.       Hair loss  Psychiatric/Behavioral:  Negative for substance abuse. The patient is not nervous/anxious.   All other systems reviewed and are negative.   EKGs/Labs/Other Studies Reviewed:    Studies reviewed were summarized above. The additional studies were reviewed today:  [facility] 01/25/2021: Conclusions: Mild to moderate, non-obstructive coronary artery disease, including 30% mid LAD disease, 50-60% mid/distal LCx stenosis, and 60% proximal/mid RCA lesion. Mildly to moderately reduced left ventricular systolic function with mid anterior hypo/akinesis; query atypical Takotsubo variant.  LVEF ~45%. Mildly elevated left heart filling pressures. Moderately elevated right heart filling and pulmonary artery pressures with significant pulmonary vascular resistance. Mildly reduced Fick cardiac output/index.   Recommendations: Continue IV diuresis. Advance goal-directed medical therapy for cardiomyopathy, as tolerated.  Patient may benefit from outpatient advanced heart failure consultation. Increase metoprolol for rate control of atrial fibrillation. Restart IV heparin in 2 hours after TR band removal.  Transition back to apixaban tomorrow AM if no evidence of bleeding/vascular injury. Secondary prevention of coronary artery disease. __________   2D echo 01/23/2021: 1. Left ventricular ejection  fraction, by estimation, is 45 to 50%. The  left ventricle has mildly decreased function. The left ventricle  demonstrates regional wall motion abnormalities (see scoring  diagram/findings for description). There is mild left  ventricular hypertrophy. Left ventricular diastolic function could not be  evaluated. There is severe hypokinesis of the left ventricular, entire  anterior wall and anteroseptal wall.   2. Right ventricular systolic function is moderately reduced. The right  ventricular size is normal. Mildly increased right ventricular wall  thickness.   3. The mitral valve is degenerative. Trivial mitral valve regurgitation.   4. The aortic valve is tricuspid. Aortic valve regurgitation not well  assessed.  __________   2D echo 09/2020: 1. Left ventricular ejection fraction, by estimation, is 60 to 65%. The  left ventricle has normal function. The left ventricle has no regional  wall motion abnormalities. Left ventricular diastolic parameters are  indeterminate.   2. Right ventricular systolic function is normal. The right ventricular  size is normal. There is normal pulmonary artery systolic pressure.   3. Left atrial size was mildly dilated.   4. The mitral valve is normal in structure. Mild mitral valve  regurgitation. __________   Renal artery ultrasound 08/2019: Summary:  Largest Aortic Diameter: 1.6 cm     Renal:     Right: Normal size right kidney. Normal right Resisitive Index.         Normal cortical thickness of right kidney. No evidence of         right renal artery stenosis. RRV flow present.  Left:  Cyst(s) noted. 1-59% stenosis of the left renal artery.  Normal size of left kidney. Normal left Resistive Index.         Normal cortical thickness of the left kidney. Cyst 1.62 cm x         1.71 cm.  Mesenteric:  Normal Celiac artery and Superior Mesenteric artery findings.  Patent IVC. __________   Carotid artery ultrasound 08/2015: 1 to 39%  bilateral ICA stenosis __________   2D echo 01/2015: - Left ventricle: The cavity size was normal. Systolic function was    normal. The estimated ejection fraction was in the range of 60%    to 65%. Wall motion was normal; there were no regional wall    motion abnormalities. Left ventricular diastolic function    parameters were normal.  - Mitral valve: There was mild regurgitation.  - Left atrium: The atrium was mildly dilated.  - Right ventricle: Systolic function was normal.  - Pulmonary arteries: Systolic pressure was borderline elevated. PA    peak pressure: 37 mm Hg (S).   Impressions:   - Normal study. Rhythm is normal sinus.    EKG:  EKG is ordered today.  The EKG ordered today demonstrates NSR, 66 bpm, low voltage QRS, nonspecific ST-T changes  Recent Labs: 01/09/2021: TSH 4.83 01/22/2021: B Natriuretic Peptide 237.3 01/23/2021: ALT 34 01/31/2021: Hemoglobin 13.9; Magnesium 2.0; Platelets 285 03/13/2021: BUN 16; Creatinine, Ser 0.83; Potassium 3.7; Sodium 139  Recent Lipid Panel    Component Value Date/Time   CHOL 130 07/12/2020 0952   CHOL 110 01/12/2016 0817   TRIG 86 07/12/2020 0952   HDL 75 07/12/2020 0952   HDL 59 01/12/2016 0817   CHOLHDL 1.7 07/12/2020 0952   LDLCALC 38 07/12/2020 0952    PHYSICAL EXAM:    VS:  BP (!) 142/70   Pulse 66   Ht 5\' 3"  (1.6 m)   Wt 187 lb 3.2 oz (84.9 kg)   SpO2 98%   BMI 33.16 kg/m   BMI: Body mass index is 33.16 kg/m.  Physical Exam Vitals reviewed.  Constitutional:      Appearance: She is well-developed.  HENT:     Head: Normocephalic and atraumatic.  Eyes:     General:        Right eye: No discharge.        Left eye: No discharge.  Neck:     Vascular: No JVD.  Cardiovascular:     Rate and Rhythm: Normal rate and regular rhythm.     Pulses:          Posterior tibial pulses are 2+ on the right side and 2+ on the left side.     Heart sounds: Normal heart sounds, S1 normal and S2 normal. Heart sounds not distant.  No midsystolic click and no opening snap. No murmur heard.   No friction rub.  Pulmonary:     Effort: Pulmonary effort is normal. No respiratory distress.     Breath sounds: Normal breath sounds. No decreased breath sounds, wheezing or rales.  Chest:     Chest wall: No tenderness.  Abdominal:     General: There is no distension.     Palpations: Abdomen is soft.     Tenderness: There is no abdominal tenderness.  Musculoskeletal:     Cervical back: Normal range of motion.     Right lower leg: Edema present.     Left lower leg: Edema present.     Comments: Trace bilateral pretibial edema.  Compression stockings noted.  Skin:    General: Skin  is warm and dry.     Nails: There is no clubbing.  Neurological:     Mental Status: She is alert and oriented to person, place, and time.  Psychiatric:        Speech: Speech normal.        Behavior: Behavior normal.        Thought Content: Thought content normal.        Judgment: Judgment normal.    Wt Readings from Last 3 Encounters:  04/12/21 187 lb 3.2 oz (84.9 kg)  03/22/21 187 lb 8 oz (85 kg)  02/24/21 188 lb (85.3 kg)     ASSESSMENT & PLAN:   Chronic combined systolic and diastolic CHF/nonischemic cardiomyopathy/pulmonary hypertension: She appears euvolemic and well compensated with NYHA class II symptoms.  Most recent echo from 03/2021 demonstrated normalization of LV systolic function.  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  Continue GDMT with carvedilol, Entresto, and furosemide.  With normalization of LV systolic function, we will defer addition of MRA/SGLT2i at this time.  Obtain labs given recent titration of Lasix.  Persistent A. fib: She is maintaining sinus rhythm following most recent DCCV.  Decrease amiodarone to 100 mg daily given her tremor.  Ultimately, we may need to consider discontinuing amiodarone if this is felt to be contributing to her tremor.  Check CMP and TSH.  Continue carvedilol.   Given a CHA2DS2-VASc of at least 7, she remains on indefinite anticoagulation with Eliquis without any symptoms concerning for bleeding with recent stable hemoglobin.  Nonobstructive CAD: No symptoms suggestive of angina at this time.  Continue risk factor modification and medical therapy including apixaban in place of aspirin to minimize bleeding risk, carvedilol, and rosuvastatin.  No indication for further ischemic testing at this time.  HTN: Blood pressure is reasonably controlled in the office today.  Continue current medical therapy as outlined above.  HLD: LDL 38 in 07/2020.  She remains on rosuvastatin.  Disposition: F/u with Dr. Rockey Situ or an APP in 3 months.   Medication Adjustments/Labs and Tests Ordered: Current medicines are reviewed at length with the patient today.  Concerns regarding medicines are outlined above. Medication changes, Labs and Tests ordered today are summarized above and listed in the Patient Instructions accessible in Encounters.   Signed, [name], [name] 04/12/2021 3:14 PM     [address] [place], [place] [address] [phone]

## 2021-04-12 NOTE — Telephone Encounter (Signed)
Patient calling Forgot to ask at appt if we can check sodium levels as well Please call to discuss

## 2021-04-13 ENCOUNTER — Telehealth: Payer: Self-pay

## 2021-04-13 DIAGNOSIS — Z79899 Other long term (current) drug therapy: Secondary | ICD-10-CM

## 2021-04-13 DIAGNOSIS — Z23 Encounter for immunization: Secondary | ICD-10-CM | POA: Diagnosis not present

## 2021-04-13 DIAGNOSIS — R7989 Other specified abnormal findings of blood chemistry: Secondary | ICD-10-CM

## 2021-04-13 LAB — COMPREHENSIVE METABOLIC PANEL
ALT: 20 IU/L (ref 0–32)
AST: 31 IU/L (ref 0–40)
Albumin/Globulin Ratio: 1.7 (ref 1.2–2.2)
Albumin: 4.3 g/dL (ref 3.7–4.7)
Alkaline Phosphatase: 78 IU/L (ref 44–121)
BUN/Creatinine Ratio: 18 (ref 12–28)
BUN: 14 mg/dL (ref 8–27)
Bilirubin Total: 0.7 mg/dL (ref 0.0–1.2)
CO2: 23 mmol/L (ref 20–29)
Calcium: 9.7 mg/dL (ref 8.7–10.3)
Chloride: 98 mmol/L (ref 96–106)
Creatinine, Ser: 0.8 mg/dL (ref 0.57–1.00)
Globulin, Total: 2.6 g/dL (ref 1.5–4.5)
Glucose: 132 mg/dL — ABNORMAL HIGH (ref 70–99)
Potassium: 4.5 mmol/L (ref 3.5–5.2)
Sodium: 139 mmol/L (ref 134–144)
Total Protein: 6.9 g/dL (ref 6.0–8.5)
eGFR: 75 mL/min/{1.73_m2} (ref >59)

## 2021-04-13 LAB — TSH: TSH: 5.03 u[IU]/mL — ABNORMAL HIGH (ref 0.450–4.500)

## 2021-04-13 NOTE — Telephone Encounter (Signed)
Called [facility] and requested the T4 to be added on. Was informed that an authorization would be faxed over.

## 2021-04-13 NOTE — Telephone Encounter (Signed)
-----   Message from Rise Mu, [name] sent at 04/13/2021  4:19 PM EDT ----- Random glucose is mildly elevated with known diabetes Renal and liver function normal Potassium normal and at goal Thyroid function is mildly abnormal  Recommendations: -Please add on a free T4 -Will need to keep close monitoring of her thyroid function given amiodarone use

## 2021-04-19 NOTE — Telephone Encounter (Signed)
Spoke with Crystal at [facility] to follow up on status of the add on testing for free T4. She stated that there was no request to add on per her records. She states that they should still have the sample and will add that on. Advised if we needed to complete form for this add on to just fax and we will be happy to complete that request. She confirmed add on for free t4. Will continue to monitor for results.

## 2021-04-20 ENCOUNTER — Ambulatory Visit (INDEPENDENT_AMBULATORY_CARE_PROVIDER_SITE_OTHER): Payer: Medicare Other

## 2021-04-20 DIAGNOSIS — Z0000 Encounter for general adult medical examination without abnormal findings: Secondary | ICD-10-CM

## 2021-04-20 NOTE — Addendum Note (Signed)
Addended by: Valora Corporal on: 04/20/2021 04:04 PM   Modules accepted: Orders

## 2021-04-20 NOTE — Telephone Encounter (Signed)
Called and spoke with Varney Biles at [facility] to follow up on the add on request for free t4. She reviewed and stated that they are unable to add on that test due to no longer having a sample. Will update provider of this information and if he should have any further requests.

## 2021-04-20 NOTE — Patient Instructions (Signed)
Bonnie Rowland , Thank you for taking time to come for your Medicare Wellness Visit. I appreciate your ongoing commitment to your health goals. Please review the following plan we discussed and let me know if I can assist you in the future.   Screening recommendations/referrals: Colonoscopy: no longer required Mammogram: done 09/01/20 Bone Density: done 08/22/19 Recommended yearly ophthalmology/optometry visit for glaucoma screening and checkup Recommended yearly dental visit for hygiene and checkup  Vaccinations: Influenza vaccine: done 04/13/21 Pneumococcal vaccine: done 07/13/13 Tdap vaccine: done 03/07/12 Shingles vaccine: done 02/02/17 & 04/05/17   Covid-19:done 07/14/19, 08/11/19, 05/17/20, 11/17/20 & 03/17/21  Conditions/risks identified: Keep up the great work!  Next appointment: Follow up in one year for your annual wellness visit    Preventive Care 65 Years and Older, Female Preventive care refers to lifestyle choices and visits with your health care provider that can promote health and wellness. What does preventive care include? A yearly physical exam. This is also called an annual well check. Dental exams once or twice a year. Routine eye exams. Ask your health care provider how often you should have your eyes checked. Personal lifestyle choices, including: Daily care of your teeth and gums. Regular physical activity. Eating a healthy diet. Avoiding tobacco and drug use. Limiting alcohol use. Practicing safe sex. Taking low-dose aspirin every day. Taking vitamin and mineral supplements as recommended by your health care provider. What happens during an annual well check? The services and screenings done by your health care provider during your annual well check will depend on your age, overall health, lifestyle risk factors, and family history of disease. Counseling  Your health care provider may ask you questions about your: Alcohol use. Tobacco use. Drug use. Emotional  well-being. Home and relationship well-being. Sexual activity. Eating habits. History of falls. Memory and ability to understand (cognition). Work and work Statistician. Reproductive health. Screening  You may have the following tests or measurements: Height, weight, and BMI. Blood pressure. Lipid and cholesterol levels. These may be checked every 5 years, or more frequently if you are over 60 years old. Skin check. Lung cancer screening. You may have this screening every year starting at age 52 if you have a 30-pack-year history of smoking and currently smoke or have quit within the past 15 years. Fecal occult blood test (FOBT) of the stool. You may have this test every year starting at age 37. Flexible sigmoidoscopy or colonoscopy. You may have a sigmoidoscopy every 5 years or a colonoscopy every 10 years starting at age 25. Hepatitis C blood test. Hepatitis B blood test. Sexually transmitted disease (STD) testing. Diabetes screening. This is done by checking your blood sugar (glucose) after you have not eaten for a while (fasting). You may have this done every 1-3 years. Bone density scan. This is done to screen for osteoporosis. You may have this done starting at age 43. Mammogram. This may be done every 1-2 years. Talk to your health care provider about how often you should have regular mammograms. Talk with your health care provider about your test results, treatment options, and if necessary, the need for more tests. Vaccines  Your health care provider may recommend certain vaccines, such as: Influenza vaccine. This is recommended every year. Tetanus, diphtheria, and acellular pertussis (Tdap, Td) vaccine. You may need a Td booster every 10 years. Zoster vaccine. You may need this after age 45. Pneumococcal 13-valent conjugate (PCV13) vaccine. One dose is recommended after age 53. Pneumococcal polysaccharide (PPSV23) vaccine. One dose is  recommended after age 34. Talk to your  health care provider about which screenings and vaccines you need and how often you need them. This information is not intended to replace advice given to you by your health care provider. Make sure you discuss any questions you have with your health care provider. Document Released: 07/15/2015 Document Revised: 03/07/2016 Document Reviewed: 04/19/2015 Elsevier Interactive Patient Education  2017 [place] Prevention in the Home Falls can cause injuries. They can happen to people of all ages. There are many things you can do to make your home safe and to help prevent falls. What can I do on the outside of my home? Regularly fix the edges of walkways and driveways and fix any cracks. Remove anything that might make you trip as you walk through a door, such as a raised step or threshold. Trim any bushes or trees on the path to your home. Use bright outdoor lighting. Clear any walking paths of anything that might make someone trip, such as rocks or tools. Regularly check to see if handrails are loose or broken. Make sure that both sides of any steps have handrails. Any raised decks and porches should have guardrails on the edges. Have any leaves, snow, or ice cleared regularly. Use sand or salt on walking paths during winter. Clean up any spills in your garage right away. This includes oil or grease spills. What can I do in the bathroom? Use night lights. Install grab bars by the toilet and in the tub and shower. Do not use towel bars as grab bars. Use non-skid mats or decals in the tub or shower. If you need to sit down in the shower, use a plastic, non-slip stool. Keep the floor dry. Clean up any water that spills on the floor as soon as it happens. Remove soap buildup in the tub or shower regularly. Attach bath mats securely with double-sided non-slip rug tape. Do not have throw rugs and other things on the floor that can make you trip. What can I do in the bedroom? Use night  lights. Make sure that you have a light by your bed that is easy to reach. Do not use any sheets or blankets that are too big for your bed. They should not hang down onto the floor. Have a firm chair that has side arms. You can use this for support while you get dressed. Do not have throw rugs and other things on the floor that can make you trip. What can I do in the kitchen? Clean up any spills right away. Avoid walking on wet floors. Keep items that you use a lot in easy-to-reach places. If you need to reach something above you, use a strong step stool that has a grab bar. Keep electrical cords out of the way. Do not use floor polish or wax that makes floors slippery. If you must use wax, use non-skid floor wax. Do not have throw rugs and other things on the floor that can make you trip. What can I do with my stairs? Do not leave any items on the stairs. Make sure that there are handrails on both sides of the stairs and use them. Fix handrails that are broken or loose. Make sure that handrails are as long as the stairways. Check any carpeting to make sure that it is firmly attached to the stairs. Fix any carpet that is loose or worn. Avoid having throw rugs at the top or bottom of the stairs. If  you do have throw rugs, attach them to the floor with carpet tape. Make sure that you have a light switch at the top of the stairs and the bottom of the stairs. If you do not have them, ask someone to add them for you. What else can I do to help prevent falls? Wear shoes that: Do not have high heels. Have rubber bottoms. Are comfortable and fit you well. Are closed at the toe. Do not wear sandals. If you use a stepladder: Make sure that it is fully opened. Do not climb a closed stepladder. Make sure that both sides of the stepladder are locked into place. Ask someone to hold it for you, if possible. Clearly mark and make sure that you can see: Any grab bars or handrails. First and last  steps. Where the edge of each step is. Use tools that help you move around (mobility aids) if they are needed. These include: Canes. Walkers. Scooters. Crutches. Turn on the lights when you go into a dark area. Replace any light bulbs as soon as they burn out. Set up your furniture so you have a clear path. Avoid moving your furniture around. If any of your floors are uneven, fix them. If there are any pets around you, be aware of where they are. Review your medicines with your doctor. Some medicines can make you feel dizzy. This can increase your chance of falling. Ask your doctor what other things that you can do to help prevent falls. This information is not intended to replace advice given to you by your health care provider. Make sure you discuss any questions you have with your health care provider. Document Released: 04/14/2009 Document Revised: 11/24/2015 Document Reviewed: 07/23/2014 Elsevier Interactive Patient Education  2017 [employer].

## 2021-04-20 NOTE — Progress Notes (Signed)
Subjective:   Bonnie Rowland is a 80 y.o. female who presents for Medicare Annual (Subsequent) preventive examination.  Virtual Visit via Telephone Note  I connected with  Bonnie Rowland on 04/20/21 at  9:20 AM EDT by telephone and verified that I am speaking with the correct person using two identifiers.  Location: Patient: home Provider: [place] Persons participating in the virtual visit: [place]   I discussed the limitations, risks, security and privacy concerns of performing an evaluation and management service by telephone and the availability of in person appointments. The patient expressed understanding and agreed to proceed.  Interactive audio and video telecommunications were attempted between this nurse and patient, however failed, due to patient having technical difficulties OR patient did not have access to video capability.  We continued and completed visit with audio only.  Some vital signs may be absent or patient reported.   Clemetine Marker, LPN   Review of Systems     Cardiac Risk Factors include: advanced age (>61men, >51 women);diabetes mellitus;dyslipidemia;hypertension;obesity (BMI >30kg/m2)     Objective:    There were no vitals filed for this visit. There is no height or weight on file to calculate BMI.  Advanced Directives 04/20/2021 01/24/2021 05/13/2020 04/19/2020 04/17/2019 04/11/2018 04/08/2017  Does Patient Have a Medical Advance Directive? Yes Yes Yes Yes Yes Yes Yes  Type of Paramedic of [place];Living will [place];Living will [place];Living will [place];Living will [place];Living will [place];Living will [place];Living will  Does patient want to make changes to medical advance directive? - No - Patient declined - - - No - Patient declined -  Copy of [place]  in Chart? Yes - validated most recent copy scanned in chart (See row information) No - copy requested No - copy requested Yes - validated most recent copy scanned in chart (See row information) Yes - validated most recent copy scanned in chart (See row information) Yes Yes  Would patient like information on creating a medical advance directive? - - - - - - -    Current Medications (verified) Outpatient Encounter Medications as of 04/20/2021  Medication Sig   acetaminophen (TYLENOL) 650 MG CR tablet Take 1,300 mg by mouth at bedtime.   ALFALFA PO Take 1 tablet by mouth daily.   amiodarone (PACERONE) 200 MG tablet Take 0.5 tablets (100 mg total) by mouth daily.   ascorbic Acid (VITAMIN C) 500 MG CPCR Take 500 mg by mouth daily.   B Complex-C (B-COMPLEX WITH VITAMIN C) tablet Take 1 tablet by mouth 2 (two) times daily.   BD PEN NEEDLE NANO U/F 32G X 4 MM MISC USE 4 TIMES DAILY (HUMALOG 3 TIMES DAILY AND LANTUS ONCE DAILY) OFFICE NOTIFIED 08/06/17   BLACK ELDERBERRY,BERRY-FLOWER, PO Take 15 mLs by mouth daily. Liquid   Calcium Carbonate (CALCIUM 600 PO) Take 600 mg by mouth daily.   carvedilol (COREG) 3.125 MG tablet Take 1 tablet (3.125 mg total) by mouth 2 (two) times daily.   cholecalciferol (VITAMIN D) 1000 UNITS tablet Take 1,000 Units by mouth daily. [place]aklee   clobetasol ointment (TEMOVATE) 3.38 % APPLY 1 APPLICATION TOPICALLY 2 TIMES DAILY AS NEEDED (BUG BITES).   Coenzyme Q10 (COQ10) 200 MG CAPS Take 200 mg by mouth daily.   Continuous Blood Gluc Sensor ([place]) MISC by Does not apply route.   Elastic Bandages & Supports (  MEDICAL COMPRESSION STOCKINGS) MISC 1 each by Does not apply route daily.   ELIQUIS 5 MG TABS tablet TAKE 1 TABLET BY MOUTH TWICE A DAY   estradiol (ESTRACE VAGINAL) 0.1 MG/GM vaginal cream 1/2 gm once weekly using applicator, apply blueberry sized amount of cream using tip of finger to urethra twice weekly   Exenatide ER (BYDUREON BCISE) 2 MG/0.85ML  AUIJ Inject 2 mg into the skin every Sunday.   furosemide (LASIX) 40 MG tablet Take 1 tablet (40 mg total) by mouth 2 (two) times daily.   GARLIC 0350 PO Take 0,938 mg by mouth daily.   Glucosamine 500 MG TABS Take 500 mg by mouth daily.   HUMALOG KWIKPEN 100 UNIT/ML KiwkPen Inject 4-12 Units into the skin 3 (three) times daily before meals. If needed sliding scale   insulin glargine (LANTUS) 100 unit/mL SOPN Inject 22 Units into the skin at bedtime.   Krill Oil 1000 MG CAPS Take 1,000 mg by mouth daily.   losartan-hydrochlorothiazide (HYZAAR) 100-25 MG tablet Take 1 tablet by mouth daily.   Mag Aspart-Potassium Aspart (POTASSIUM & MAGNESIUM ASPARTAT PO) Take 1 tablet by mouth daily.   Multiple Vitamins-Minerals (PRESERVISION AREDS 2) CAPS Take 1 capsule by mouth 2 (two) times daily.   OVER THE COUNTER MEDICATION Place 1 application into both eyes See admin instructions. Blephadex eyelid foam, apply to eyelids once daily   OVER THE COUNTER MEDICATION Take 1 tablet by mouth daily. Vita-Lea Gold otc supplement With out vitamin K (Shaklee products)   OVER THE COUNTER MEDICATION Take 1 tablet by mouth See admin instructions. Nutriferon otc supplement take Take 1 tablet on Sun, Tues, Thurs, Sat. Take 1 tablet twice daily on Mon, Wed, and Fri   Probiotic Product (PROBIOTIC PO) Take 1 capsule by mouth at bedtime.   PROLIA 60 MG/ML SOSY injection Inject 60 mg into the skin every 6 (six) months.   Propylene Glycol (SYSTANE BALANCE OP) Place 1 drop into both eyes daily as needed (dry eyes).   rosuvastatin (CRESTOR) 10 MG tablet TAKE 1 TABLET (10 MG TOTAL) BY MOUTH 4 (FOUR) TIMES A WEEK.   sacubitril-valsartan (ENTRESTO) 24-26 MG Take 1 tablet by mouth 2 (two) times daily.   Sodium Chloride-Sodium Bicarb (NETI POT SINUS WASH NA) Place 1 Dose into the nose at bedtime.   tiZANidine (ZANAFLEX) 2 MG tablet Take 1 tablet (2 mg total) by mouth at bedtime.   Vitamin D, Ergocalciferol, (DRISDOL) 1.25 MG (50000 UT)  CAPS capsule Take 50,000 Units by mouth every Saturday.   [DISCONTINUED] fluticasone (FLONASE) 50 MCG/ACT nasal spray Place 2 sprays into both nostrils daily as needed for allergies or rhinitis.   [DISCONTINUED] metoprolol succinate (TOPROL-XL) 50 MG 24 hr tablet Take 1 tablet (50 mg total) by mouth 2 (two) times daily. Take with or immediately following a meal. (Patient not taking: Reported on 01/23/2021)   No facility-administered encounter medications on file as of 04/20/2021.    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)    Renal artery stenosis (HCC)    Sprain of lumbar region    Thoracic or lumbosacral neuritis or radiculitis, unspecified    Urinary tract infection, site not specified    Past Surgical History:  Procedure Laterality Date   APPENDECTOMY     CARDIAC CATHETERIZATION     CARDIOVERSION N/A 11/30/2020   Procedure: CARDIOVERSION;  Surgeon: Minna Merritts, MD;  Location: [facility] OR[facility];  Service: Cardiovascular;  Laterality: N/A;   CARDIOVERSION N/A 01/20/2021   Procedure: CARDIOVERSION;  Surgeon: Minna Merritts, MD;  Location: [facility];  Service: Cardiovascular;  Laterality: N/A;   CARDIOVERSION  N/A 01/30/2021   Procedure: CARDIOVERSION;  Surgeon: Wellington Hampshire, MD;  Location: [facility];  Service: Cardiovascular;  Laterality: N/A;   cataract surgery     COLONOSCOPY     COLONOSCOPY WITH PROPOFOL N/A 05/09/2016   Procedure: COLONOSCOPY WITH PROPOFOL;  Surgeon: Robert Bellow, MD;  Location: [facility];  Service: Endoscopy;  Laterality: N/A;   DIAGNOSTIC MAMMOGRAM     EYE SURGERY Bilateral    Cataract Extraction   JOINT REPLACEMENT  2016   KNEE ARTHROPLASTY Right 03/30/2015   Procedure: COMPUTER ASSISTED TOTAL KNEE ARTHROPLASTY;  Surgeon: Dereck Leep, MD;  Location: [facility];  Service: Orthopedics;  Laterality: Right;   KNEE ARTHROSCOPY Right    KNEE CLOSED REDUCTION Right 05/23/2015   Procedure: CLOSED MANIPULATION KNEE;  Surgeon: Dereck Leep, MD;  Location: [facility];  Service: Orthopedics;  Laterality: Right;   RIGHT/LEFT HEART CATH AND CORONARY ANGIOGRAPHY N/A 01/25/2021   Procedure: RIGHT/LEFT HEART CATH AND CORONARY ANGIOGRAPHY;  Surgeon: Nelva Bush, MD;  Location: [facility];  Service: Cardiovascular;  Laterality: N/A;   TONSILLECTOMY     Family History  Problem Relation Age of Onset   Diabetes Mother    Hypertension Mother    Cancer Mother    Kidney disease Mother    Hypertension Father    Cancer Father        Prostate   Breast cancer Maternal Aunt 54   Colon cancer Son    Kidney cancer Neg Hx    Bladder Cancer Neg Hx    Social History   Socioeconomic History   Marital status: Married    Spouse name: Emergency planning/management officer   Number of children: 1   Years of education: Not on file   Highest education level: Master's degree (e.g., MA, MS, MEng, MEd, MSW, MBA)  Occupational History   Occupation: Retired  Tobacco Use   Smoking status: Never   Smokeless tobacco: Never   Tobacco comments:    Smoking cessation materials not required  Vaping Use   Vaping Use: Never used  Substance and Sexual Activity   Alcohol use: Not Currently    Alcohol/week:  1.0 standard drink    Types: 1 Glasses of wine per week   Drug use: No   Sexual activity: Yes    Partners: Male    Birth control/protection: Post-menopausal  Other Topics Concern   Not on file  Social History Narrative   She is married , only has one son and he  was diagnosed with colon cancer at age 8.    Social Determinants of Health   Financial Resource Strain: Low Risk    Difficulty of Paying Living Expenses: Not hard at all  Food Insecurity: No Food Insecurity   Worried About Charity fundraiser in the Last Year: Never true   [place] in the Last Year: Never true  Transportation Needs: No Transportation Needs   Lack of Transportation (Medical): No   Lack of Transportation (Non-Medical): No  Physical Activity: Sufficiently Active   Days of Exercise per Week: 7 days   Minutes of Exercise per Session: 30 min  Stress: No Stress Concern Present   Feeling of Stress : Not at all  Social Connections: Socially Integrated   Frequency of Communication with Friends and Family: More than three times a week   Frequency of Social Gatherings with Friends and Family: More than three times a week   Attends Religious Services: More than 4 times per year   Active Member of [employer] or Organizations: Yes   Attends Music therapist: More than 4 times per year   Marital Status: Married    Tobacco Counseling Counseling given: No Tobacco comments: Smoking cessation materials not required   Clinical Intake:  Pre-visit preparation completed: Yes  Pain : No/denies pain     Nutritional Risks: None Diabetes: Yes CBG done?: No Did pt. bring in CBG monitor from home?: No  How often do you need to have someone help you when you read instructions, pamphlets, or other written materials from your doctor or pharmacy?: 1 - Never  Nutrition Risk Assessment:  Has the patient had any N/V/D within the last 2 months?  No  Does the patient have any non-healing wounds?  No  Has  the patient had any unintentional weight loss or weight gain?  No   Diabetes:  Is the patient diabetic?  Yes  If diabetic, was a CBG obtained today?  No  Did the patient bring in their glucometer from home?  No  How often do you monitor your CBG's? Several times per day.   Financial Strains and Diabetes Management:  Are you having any financial strains with the device, your supplies or your medication? No .  Does the patient want to be seen by Chronic Care Management for management of their diabetes?  No  already enrolled Would the patient like to be referred to a Nutritionist or for Diabetic Management?  No   Diabetic Exams:  Diabetic Eye Exam: Completed 01/17/21.   Diabetic Foot Exam: Completed 04/07/20. Pt has been advised about the importance in completing this exam. Pt is scheduled for diabetic foot exam on 05/04/21 with podiatry   Interpreter Needed?: No  Information entered by :: Clemetine Marker LPN   Activities of Daily Living In your present state of health, do you have any difficulty performing the following activities: 04/20/2021 03/22/2021  Hearing? Y N  Comment wears hearing aids when in a group -  Vision? N N  Difficulty concentrating or making decisions? N N  Walking or climbing stairs? N N  Dressing or bathing? N N  Doing errands, shopping? N N  Preparing Food and eating ? N -  Using the Toilet? N -  In the past six months, have you accidently leaked urine? N -  Do you have problems with loss of bowel control? N -  Managing your Medications? N -  Managing your Finances? N -  Housekeeping or managing  your Housekeeping? N -  Some recent data might be hidden    Patient Care Team: Steele Sizer, MD as PCP - General (Family Medicine) Rockey Situ Kathlene November, MD as PCP - Cardiology (Cardiology) Minna Merritts, MD as Consulting Physician (Cardiology) Dasher, Rayvon Char, MD as Consulting Physician (Dermatology) Estill Cotta, MD as Consulting Physician  (Ophthalmology) Lonia Farber, MD as Consulting Physician (Internal Medicine) Beverly Gust, MD as Consulting Physician (Otolaryngology) Iris Pert, [place] as Referring Physician (Chiropractic Medicine) Germaine Pomfret, [facility] as Pharmacist (Pharmacist)  Indicate any recent Medical Services you may have received from other than [facility] providers in the past year (date may be approximate).     Assessment:   This is a routine wellness examination for Krithika.  Hearing/Vision screen Hearing Screening - Comments:: Pt has hearing aids but only wears them when in a crowd or group Vision Screening - Comments:: Annual vision screenings [facility] Dr. Thomasene Ripple  Dietary issues and exercise activities discussed: Current Exercise Habits: Home exercise routine, Type of exercise: walking, Time (Minutes): 30, Frequency (Times/Week): 7, Weekly Exercise (Minutes/Week): 210, Intensity: Moderate, Exercise limited by: cardiac condition(s)   Goals Addressed             This Visit's Progress    Monitor and Manage My Blood Sugar-Diabetes Type 2   On track    Timeframe:  Long-Range Goal Priority:  High Start Date: 02/09/2021                             Expected End Date:  02/09/2022                     Follow Up Date 05/17/2021    - check blood sugar at prescribed times - check blood sugar before and after exercise - check blood sugar if I feel it is too high or too low - take the blood sugar meter to all doctor visits    Why is this important?   Checking your blood sugar at home helps to keep it from getting very high or very low.  Writing the results in a diary or log helps the doctor know how to care for you.  Your blood sugar log should have the time, date and the results.  Also, write down the amount of insulin or other medicine that you take.  Other information, like what you ate, exercise done and how you were feeling, will also be helpful.     Notes:         Depression Screen PHQ 2/9 Scores 04/20/2021 03/22/2021 02/09/2021 02/02/2021 01/09/2021 07/12/2020 04/19/2020  PHQ - 2 Score 0 0 0 0 0 0 0  PHQ- 9 Score - - - - - - -  Exception Documentation - - - - - - -    Fall Risk Fall Risk  04/20/2021 03/22/2021 02/24/2021 02/09/2021 02/02/2021  Falls in the past year? 0 0 0 0 0  Comment - - - - -  Number falls in past yr: 0 0 0 0 0  Injury with Fall? 0 0 0 0 0  Risk for fall due to : No Fall Risks - - - Impaired balance/gait  Risk for fall due to: Comment - - - - -  Follow up Falls prevention discussed Falls evaluation completed;Falls prevention discussed Falls evaluation completed - Falls evaluation completed;Falls prevention discussed    FALL RISK PREVENTION PERTAINING TO THE HOME:  Any  stairs in or around the home? Yes  If so, are there any without handrails? No  Home free of loose throw rugs in walkways, pet beds, electrical cords, etc? Yes  Adequate lighting in your home to reduce risk of falls? Yes   ASSISTIVE DEVICES UTILIZED TO PREVENT FALLS:  Life alert? No  Use of a cane, walker or w/c? Yes - cane when out for longs periods of time Grab bars in the bathroom? Yes  Shower chair or bench in shower? Yes  Elevated toilet seat or a handicapped toilet? Yes   TIMED UP AND GO:  Was the test performed? No . Telephonic visit.   Cognitive Function: Normal cognitive status assessed by direct observation by this Nurse Health Advisor. No abnormalities found.       6CIT Screen 04/11/2018 04/08/2017  What Year? 0 points 0 points  What month? 0 points 0 points  What time? 0 points 0 points  Count back from 20 0 points 0 points  Months in reverse 0 points 0 points  Repeat phrase 0 points 0 points  Total Score 0 0    Immunizations Immunization History  Administered Date(s) Administered   Fluad Quad(high Dose 65+) 03/19/2019, 04/20/2020   Influenza, High Dose Seasonal PF 03/10/2015, 03/02/2016, 03/12/2017, 04/07/2018, 04/13/2021   Moderna  Sars-Covid-2 Vaccination 07/14/2019, 08/11/2019, 05/17/2020, 11/17/2020, 03/17/2021   Pneumococcal Conjugate-13 07/13/2013   Pneumococcal Polysaccharide-23 03/31/2010   Tdap 03/07/2012   Zoster Recombinat (Shingrix) 02/02/2017, 04/05/2017   Zoster, Live 06/01/2010    TDAP status: Up to date  Flu Vaccine status: Up to date  Pneumococcal vaccine status: Up to date  Covid-19 vaccine status: Completed vaccines  Qualifies for Shingles Vaccine? Yes   Zostavax completed Yes   Shingrix Completed?: Yes  Screening Tests Health Maintenance  Topic Date Due   FOOT EXAM  04/07/2021   COVID-19 Vaccine (6 - Booster for Moderna series) 05/12/2021   HEMOGLOBIN A1C  07/12/2021   DEXA SCAN  08/21/2021   MAMMOGRAM  09/01/2021   OPHTHALMOLOGY EXAM  01/17/2022   TETANUS/TDAP  03/07/2022   Pneumonia Vaccine 78+ Years old  Completed   INFLUENZA VACCINE  Completed   Hepatitis C Screening  Completed   Zoster Vaccines- Shingrix  Completed   HPV VACCINES  Aged Out    Health Maintenance  Health Maintenance Due  Topic Date Due   FOOT EXAM  04/07/2021    Colorectal cancer screening: No longer required.   Mammogram status: Completed 09/01/20. Repeat every year  Bone Density status: Completed 08/22/19. Results reflect: Bone density results: OSTEOPOROSIS. Repeat every 2 years.  Lung Cancer Screening: (Low Dose CT Chest recommended if Age 6-80 years, 30 pack-year currently smoking OR have quit w/in 15years.) does not qualify.  Additional Screening:  Hepatitis C Screening: does qualify; Completed 01/07/20  Vision Screening: Recommended annual ophthalmology exams for early detection of glaucoma and other disorders of the eye. Is the patient up to date with their annual eye exam?  Yes  Who is the provider or what is the name of the office in which the patient attends annual eye exams? [facility].   Dental Screening: Recommended annual dental exams for proper oral hygiene  Community  Resource Referral / Chronic Care Management: CRR required this visit?  No   CCM required this visit?  No      Plan:     I have personally reviewed and noted the following in the patient's chart:   Medical and social history Use of  alcohol, tobacco or illicit drugs  Current medications and supplements including opioid prescriptions.  Functional ability and status Nutritional status Physical activity Advanced directives List of other physicians Hospitalizations, surgeries, and ER visits in previous 12 months Vitals Screenings to include cognitive, depression, and falls Referrals and appointments  In addition, I have reviewed and discussed with patient certain preventive protocols, quality metrics, and best practice recommendations. A written personalized care plan for preventive services as well as general preventive health recommendations were provided to patient.     Clemetine Marker, LPN   [id number]   Nurse Notes: none

## 2021-04-21 NOTE — Telephone Encounter (Signed)
She can come in at her convenience to have just a free T4 drawn. Dx: abnormal TSH and high risk medication use.

## 2021-04-24 NOTE — Addendum Note (Signed)
Addended by: Valora Corporal on: 04/24/2021 12:49 PM   Modules accepted: Orders

## 2021-04-24 NOTE — Telephone Encounter (Signed)
Spoke with patient and reviewed we needed one more lab. She verbalized understanding and was willing to come in tomorrow to have that done. Scheduled appointment and she was appreciative for the call.

## 2021-04-25 ENCOUNTER — Other Ambulatory Visit: Payer: Self-pay

## 2021-04-25 ENCOUNTER — Other Ambulatory Visit (INDEPENDENT_AMBULATORY_CARE_PROVIDER_SITE_OTHER): Payer: Medicare Other

## 2021-04-25 DIAGNOSIS — Z79899 Other long term (current) drug therapy: Secondary | ICD-10-CM | POA: Diagnosis not present

## 2021-04-25 DIAGNOSIS — R7989 Other specified abnormal findings of blood chemistry: Secondary | ICD-10-CM

## 2021-04-26 LAB — T4, FREE: Free T4: 1.53 ng/dL (ref 0.82–1.77)

## 2021-05-03 ENCOUNTER — Other Ambulatory Visit: Payer: Self-pay | Admitting: Cardiovascular Disease

## 2021-05-03 ENCOUNTER — Other Ambulatory Visit: Payer: Self-pay | Admitting: Family Medicine

## 2021-05-03 DIAGNOSIS — W57XXXD Bitten or stung by nonvenomous insect and other nonvenomous arthropods, subsequent encounter: Secondary | ICD-10-CM

## 2021-05-03 NOTE — Telephone Encounter (Signed)
Requested medication (s) are due for refill today - unsure  Requested medication (s) are on the active medication list -yes  Future visit scheduled -yes  Last refill: 12/06/20  Notes to clinic: Request RF: Rx written for acute problem- not sure to continue-sent for review   Requested Prescriptions  Pending Prescriptions Disp Refills   clobetasol ointment (TEMOVATE) 0.05 % [Pharmacy Med Name: CLOBETASOL 0.05% OINTMENT] 30 g 0    Sig: APPLY 1 APPLICATION TOPICALLY 2 TIMES DAILY AS NEEDED (BUG BITES).     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[facility])   [facility] [place], Drue Stager, MD   9 months ago Diabetes mellitus type 2 in obese [facility])   [facility] Steele Sizer, MD   1 year ago [facility] Steele Sizer, MD   1 year ago Atherosclerosis of renal artery [facility])   [facility] Steele Sizer, MD       Future Appointments             In 1 month McGowan, Gordan Payment [facility]   In 2 months Steele Sizer, MD [facility], [place]   In 2 months Dane, MD [facility], [facility]   In 11 months  [facility], [facility]               Requested Prescriptions  Pending Prescriptions Disp Refills   clobetasol ointment (TEMOVATE) 0.05 % [Pharmacy Med Name: CLOBETASOL 0.05% OINTMENT] 30 g 0    Sig: APPLY 1 APPLICATION TOPICALLY 2 TIMES DAILY AS NEEDED (BUG BITES).     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[facility])   [facility] Steele Sizer, MD   9 months ago Diabetes mellitus type 2 in obese [facility])   [facility] Steele Sizer, MD   1 year ago [facility] Steele Sizer, MD   1 year ago Atherosclerosis of renal artery [facility])   [facility] Steele Sizer, MD       Future Appointments             In 1 month McGowan, Gordan Payment [facility]   In 2 months Steele Sizer, MD [facility], [place]   In 2 months Gollan, Kathlene November, MD [facility], [place]   In 11 months  [facility], [facility]

## 2021-05-03 NOTE — Telephone Encounter (Signed)
No future appt with our office

## 2021-05-04 ENCOUNTER — Encounter: Payer: Self-pay | Admitting: Podiatry

## 2021-05-04 ENCOUNTER — Other Ambulatory Visit: Payer: Self-pay

## 2021-05-04 ENCOUNTER — Ambulatory Visit (INDEPENDENT_AMBULATORY_CARE_PROVIDER_SITE_OTHER): Payer: Medicare Other | Admitting: Podiatry

## 2021-05-04 DIAGNOSIS — L84 Corns and callosities: Secondary | ICD-10-CM | POA: Diagnosis not present

## 2021-05-04 DIAGNOSIS — M2042 Other hammer toe(s) (acquired), left foot: Secondary | ICD-10-CM

## 2021-05-04 DIAGNOSIS — Q828 Other specified congenital malformations of skin: Secondary | ICD-10-CM | POA: Diagnosis not present

## 2021-05-04 DIAGNOSIS — I701 Atherosclerosis of renal artery: Secondary | ICD-10-CM | POA: Diagnosis not present

## 2021-05-04 DIAGNOSIS — Z7901 Long term (current) use of anticoagulants: Secondary | ICD-10-CM

## 2021-05-04 NOTE — Progress Notes (Signed)
This patient returns to my office for at risk foot care.  This patient requires this care by a professional since this patient will be at risk due to having  Diabetes.  Patient has painful corn on her third toe left foot.This patient is unable to cut corn herself since the patient cannot reach her foot..This callus  is painful walking and wearing shoes. .  She says she is also developing a painful callus under the outside of her right foot. This patient presents for at risk foot care today.   General Appearance  Alert, conversant and in no acute stress.  Vascular  Dorsalis pedis pulses  are palpable  bilaterally. Posterior tibial pulses are weakly palpable  B/L. Capillary return is within normal limits  bilaterally. Temperature is within normal limits  Bilaterally. Swelling ankles/lower legs.  Superficial veins both feet./  Neurologic  Senn-Weinstein monofilament wire test within normal limits  bilaterally. Muscle power within normal limits bilaterally.  Nails Normal nails with no evidence of fungus.. No evidence of bacterial infection or drainage bilaterally.  Orthopedic  No limitations of motion  feet .  No crepitus or effusions noted.  Contracted digits B/L with deviated fourth toes  B/L. Hammer toes third toe left foot.  Skin  normotropic skin with no porokeratosis noted bilaterally.  No signs of infections or ulcers noted.  Clavi 3rd toe  Left foot.     Clavi third toe left foot.  Consent was obtained for treatment procedures. Debride clavi  Third toe was debrided with # 15 blade  left followed by dremel tool usage. Debride porokeratosis right foot.    Discussed flexor tenotomy surgery with this patient.  She is to see Dr.  Posey Pronto.  Told her to pick up spenco insoles to cushion her feet.        Return office visit    4 months                 Told patient to return for periodic foot care and evaluation due to potential at risk complications.   [name] DPM

## 2021-05-05 ENCOUNTER — Ambulatory Visit: Payer: Medicare Other | Admitting: Family

## 2021-05-07 NOTE — Progress Notes (Signed)
Patient ID: Bonnie Rowland, female    DOB: [birth year], 80 y.o.   MRN: [id number]  HPI  [name] is a 80 y/o female with a history of HTN, DM, hyperlipidemia, atrial fibrillation, GERD and chronic heart failure.   Echo report from 03/31/21 reviewed and showed an EF of  60-65% without structural changes. Echo report from 01/23/21 reviewed and showed an EF of 45-50% with mild LVH, severe hypokinesis of left ventricle and trivial MR.   RHC/LHC done 01/25/21 and showed: Mild to moderate, non-obstructive coronary artery disease, including 30% mid LAD disease, 50-60% mid/distal LCx stenosis, and 60% proximal/mid RCA lesion. Mildly to moderately reduced left ventricular systolic function with mid anterior hypo/akinesis; query atypical Takotsubo variant.  LVEF ~45%. Mildly elevated left heart filling pressures. Moderately elevated right heart filling and pulmonary artery pressures with significant pulmonary vascular resistance. Mildly reduced Fick cardiac output/index.  Admitted 01/23/21 due to HF exacerbation along with HTN. Initially given IV lasix and IV morphine and then transitioned to oral diuretics. Had to be placed on bipap. Cardiology consult obtained. Successfully cardioverted 01/30/21. Beta-blocker held. Elevated troponin thought to be due to demand ischemia. Discharged after 8 days.   She presents today for a follow-up visit with a chief complaint of pedal edema. She describes this as chronic in nature having been present for several years. She does wear compression socks daily which do help with the edema. She is currently experiencing some sciatic pain along with this. She denies any difficulty sleeping, dizziness, abdominal distention, palpitations, chest pain, shortness of breath, cough, fatigue or weight gain.   Is walking daily for ~ 1/2 hour along with doing tai chi. She is thinking about joining a local gym. Overall, she says that she feels "great".   Inadvertently starting taking  losartan ~ 1 week ago because she got confused thinking she should be on it since the bottle says losartan potassium.    Has noticed some hair loss and was wondering if it could be related to any of her medications.   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; left shoulder blade   Cancer (Bonnie Rowland) 05/2014   basal cell removed left temple   Cataract 2003   Cellulitis and abscess of leg, except foot    Conjunctivitis unspecified    Dermatophytosis of nail    Diabetes mellitus    type II   Esophageal reflux    Hyperlipidemia    Hypertension    Osteoporosis 2016   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)    Renal artery stenosis (HCC)    Sprain of lumbar region    Thoracic or lumbosacral neuritis or radiculitis, unspecified    Urinary tract infection, site not specified    Past Surgical History:  Procedure Laterality Date   APPENDECTOMY     CARDIAC CATHETERIZATION     CARDIOVERSION N/A 11/30/2020   Procedure: CARDIOVERSION;  Surgeon: Minna Merritts, MD;  Location: [facility];  Service: Cardiovascular;  Laterality: N/A;   CARDIOVERSION N/A 01/20/2021   Procedure: CARDIOVERSION;  Surgeon: Minna Merritts, MD;  Location: [facility];  Service: Cardiovascular;  Laterality:  N/A;   CARDIOVERSION N/A 01/30/2021   Procedure: CARDIOVERSION;  Surgeon: Wellington Hampshire, MD;  Location: [facility];  Service: Cardiovascular;  Laterality: N/A;   cataract surgery     COLONOSCOPY     COLONOSCOPY WITH PROPOFOL N/A 05/09/2016   Procedure: COLONOSCOPY WITH PROPOFOL;  Surgeon: Robert Bellow, MD;  Location: [facility];  Service: Endoscopy;  Laterality: N/A;   DIAGNOSTIC MAMMOGRAM      EYE SURGERY Bilateral    Cataract Extraction   JOINT REPLACEMENT  2016   KNEE ARTHROPLASTY Right 03/30/2015   Procedure: COMPUTER ASSISTED TOTAL KNEE ARTHROPLASTY;  Surgeon: Dereck Leep, MD;  Location: [facility];  Service: Orthopedics;  Laterality: Right;   KNEE ARTHROSCOPY Right    KNEE CLOSED REDUCTION Right 05/23/2015   Procedure: CLOSED MANIPULATION KNEE;  Surgeon: Dereck Leep, MD;  Location: [facility];  Service: Orthopedics;  Laterality: Right;   RIGHT/LEFT HEART CATH AND CORONARY ANGIOGRAPHY N/A 01/25/2021   Procedure: RIGHT/LEFT HEART CATH AND CORONARY ANGIOGRAPHY;  Surgeon: Nelva Bush, MD;  Location: [facility];  Service: Cardiovascular;  Laterality: N/A;   TONSILLECTOMY     Family History  Problem Relation Age of Onset   Diabetes Mother    Hypertension Mother    Cancer Mother    Kidney disease Mother    Hypertension Father    Cancer Father        Prostate   Breast cancer Maternal Aunt 59   Colon cancer Son    Kidney cancer Neg Hx    Bladder Cancer Neg Hx    Social History   Tobacco Use   Smoking status: Never   Smokeless tobacco: Never   Tobacco comments:    Smoking cessation materials not required  Substance Use Topics   Alcohol use: Not Currently    Alcohol/week: 1.0 standard drink    Types: 1 Glasses of wine per week   Allergies  Allergen Reactions   Cyclobenzaprine Hypertension   Cheese Other (See Comments)    bloating   Ciprofloxacin Hcl Other (See Comments)    Muscle pain   Coconut Oil     Upset stomach   Keflex [Cephalexin] Other (See Comments)    Patient prefers not to take this medication due to the side effects, caused tendon issues   Prior to Admission medications   Medication Sig Start Date End Date Taking? Authorizing Provider  acetaminophen (TYLENOL) 650 MG CR tablet Take 1,300 mg by mouth at bedtime.   Yes [provider]  ALFALFA PO Take 1 tablet by mouth daily.   Yes [provider]  amiodarone  (PACERONE) 200 MG tablet Take 0.5 tablets (100 mg total) by mouth daily. 04/12/21  Yes Dunn, Areta Haber, [name]  ascorbic Acid (VITAMIN C) 500 MG CPCR Take 500 mg by mouth daily.   Yes [provider]  B Complex-C (B-COMPLEX WITH VITAMIN C) tablet Take 1 tablet by mouth 2 (two) times daily.   Yes [provider]  BD PEN NEEDLE NANO U/F 32G X 4 MM MISC USE 4 TIMES DAILY (HUMALOG 3 TIMES DAILY AND LANTUS ONCE DAILY) OFFICE NOTIFIED 08/06/17 12/07/18  Yes Sowles, Drue Stager, MD  BLACK ELDERBERRY,BERRY-FLOWER, PO Take 15 mLs by mouth daily. Liquid   Yes [provider]  Calcium Carbonate (CALCIUM 600 PO) Take 600 mg by mouth daily.   Yes [provider]  carvedilol (COREG) 3.125 MG tablet Take 1 tablet (3.125 mg total) by mouth 2 (two) times daily. 02/10/21 05/11/21 Yes  Rise Mu, [name]  cholecalciferol (VITAMIN D) 1000 UNITS tablet Take 1,000 Units by mouth daily. [place]lee   Yes [provider]  clobetasol ointment (TEMOVATE) 9.29 % APPLY 1 APPLICATION TOPICALLY 2 TIMES DAILY AS NEEDED (BUG BITES). 05/03/21  Yes Sowles, Drue Stager, MD  Coenzyme Q10 (COQ10) 200 MG CAPS Take 200 mg by mouth daily.   Yes [provider]  Continuous Blood Gluc Sensor (McNab) MISC by Does not apply route.   Yes [provider]  Elastic Bandages & Supports (MEDICAL COMPRESSION STOCKINGS) [place] 1 each by Does not apply route daily. 01/08/19  Yes Sowles, Drue Stager, MD  ELIQUIS 5 MG TABS tablet TAKE 1 TABLET BY MOUTH TWICE A DAY 02/28/21  Yes Gollan, Kathlene November, MD  estradiol (ESTRACE VAGINAL) 0.1 MG/GM vaginal cream 1/2 gm once weekly using applicator, apply blueberry sized amount of cream using tip of finger to urethra twice weekly 06/10/20  Yes McGowan, Larene Beach A, [name]  Exenatide ER (BYDUREON BCISE) 2 MG/0.85ML AUIJ Inject 2 mg into the skin every Sunday. 02/26/20  Yes [provider]  furosemide (LASIX) 40 MG tablet Take 1 tablet (40 mg total) by mouth 2  (two) times daily. 02/24/21  Yes Javien Tesch A, FNP  GARLIC 2446 PO Take 2,863 mg by mouth daily.   Yes [provider]  Glucosamine 500 MG TABS Take 500 mg by mouth daily.   Yes [provider]  HUMALOG KWIKPEN 100 UNIT/ML KiwkPen Inject 4-12 Units into the skin 3 (three) times daily before meals. If needed sliding scale 10/15/17  Yes [provider]  insulin glargine (LANTUS) 100 unit/mL SOPN Inject 22 Units into the skin at bedtime.   Yes [provider]  Javier Docker Oil 1000 MG CAPS Take 1,000 mg by mouth daily.   Yes [provider]  losartan (COZAAR) 25 MG tablet Take 25 mg by mouth daily.   Yes [provider]  Mag Aspart-Potassium Aspart (POTASSIUM & MAGNESIUM ASPARTAT PO) Take 1 tablet by mouth daily.   Yes [provider]  Multiple Vitamins-Minerals (PRESERVISION AREDS 2) CAPS Take 1 capsule by mouth 2 (two) times daily.   Yes [provider]  OVER THE COUNTER MEDICATION Place 1 application into both eyes See admin instructions. Blephadex eyelid foam, apply to eyelids once daily   Yes [provider]  OVER THE COUNTER MEDICATION Take 1 tablet by mouth daily. Vita-Lea Gold otc supplement With out vitamin K (Shaklee products)   Yes [provider]  OVER THE COUNTER MEDICATION Take 1 tablet by mouth See admin instructions. Nutriferon otc supplement take Take 1 tablet on Sun, Tues, Thurs, Sat. Take 1 tablet twice daily on Mon, Wed, and Fri   Yes [provider]  Probiotic Product (PROBIOTIC PO) Take 1 capsule by mouth at bedtime.   Yes [provider]  PROLIA 60 MG/ML SOSY injection Inject 60 mg into the skin every 6 (six) months. 09/02/19  Yes [provider]  Propylene Glycol (SYSTANE BALANCE OP) Place 1 drop into both eyes daily as needed (dry eyes).   Yes [provider]  rosuvastatin (CRESTOR) 10 MG tablet TAKE 1 TABLET (10 MG TOTAL) BY MOUTH 4 (FOUR) TIMES A WEEK. 02/21/21   Yes Sowles, Drue Stager, MD  sacubitril-valsartan (ENTRESTO) 24-26 MG Take 1 tablet by mouth 2 (two) times daily. 02/24/21  Yes Alison Breeding, Otila Kluver A, FNP  Sodium Chloride-Sodium Bicarb (NETI POT SINUS WASH NA) Place 1 Dose into the nose at bedtime.  Yes [provider]  tiZANidine (ZANAFLEX) 2 MG tablet Take 1 tablet (2 mg total) by mouth at bedtime. 01/25/21  Yes Sowles, Drue Stager, MD  Vitamin D, Ergocalciferol, (DRISDOL) 1.25 MG (50000 UT) CAPS capsule Take 50,000 Units by mouth every Saturday. 11/23/18  Yes [provider]     Review of Systems  Constitutional:  Negative for appetite change and fatigue.  HENT:  Negative for congestion, postnasal drip and sore throat.   Eyes: Negative.   Respiratory:  Negative for cough, chest tightness and shortness of breath.   Cardiovascular:  Positive for leg swelling. Negative for chest pain and palpitations.  Gastrointestinal:  Negative for abdominal distention and abdominal pain.  Endocrine:       Hair loss  Genitourinary: Negative.   Musculoskeletal:  Positive for arthralgias (right hip). Negative for back pain.  Skin: Negative.   Allergic/Immunologic: Negative.   Neurological:  Negative for dizziness and light-headedness.  Hematological:  Negative for adenopathy. Does not bruise/bleed easily.  Psychiatric/Behavioral:  Negative for dysphoric mood and sleep disturbance (sleeping in recliner). The patient is not nervous/anxious.    Vitals:   05/09/21 0929 05/09/21 0949  BP: (!) 180/54 (!) 148/48  Pulse: (!) 53   Resp: 18   SpO2: 99%   Weight: 182 lb (82.6 kg)   Height: 5' 3"  (1.6 m)    Wt Readings from Last 3 Encounters:  05/09/21 182 lb (82.6 kg)  04/12/21 187 lb 3.2 oz (84.9 kg)  03/22/21 187 lb 8 oz (85 kg)   Lab Results  Component Value Date   CREATININE 0.80 04/12/2021   CREATININE 0.83 03/13/2021   CREATININE 0.94 02/07/2021   Physical Exam Vitals and nursing note reviewed.  Constitutional:      Appearance: Normal  appearance.  HENT:     Head: Normocephalic and atraumatic.  Cardiovascular:     Rate and Rhythm: Regular rhythm. Bradycardia present.  Pulmonary:     Effort: Pulmonary effort is normal. No respiratory distress.     Breath sounds: No wheezing or rales.  Abdominal:     General: There is no distension.     Palpations: Abdomen is soft.  Musculoskeletal:        General: No tenderness.     Cervical back: Normal range of motion and neck supple.     Right lower leg: Edema (1+ pitting) present.     Left lower leg: Edema (1+ pitting) present.  Skin:    General: Skin is warm and dry.  Neurological:     General: No focal deficit present.     Mental Status: She is alert and oriented to person, place, and time.  Psychiatric:        Mood and Affect: Mood normal.        Behavior: Behavior normal.        Thought Content: Thought content normal.   Assessment & Plan:  1: Chronic heart failure with now preserved ejection fraction- - NYHA class I - euvolemic today - weighing daily; reminded to call for an overnight weight gain of > 2 pounds or a weekly weight gain of > 5 pounds.  - weight down 5 pounds from last visit 6 weeks ago - has increased her activity and is now walking daily for ~ 1/2 hour along with doing tai chi - not adding salt and has been reading food labels - EF has now improved - continued GDMT of entresto - getting entresto through [employer] patient assistance - advised patient that she  should not be taking both losartan and entresto; she says that she remembered that but then got confused and so resumed the losartan ~ 1 week ago in addition to entresto; advised patient to stop the losartan - will not check labs today since it's only been a week that she's been doing this and lab work from 1 month ago was normal - wearing compression socks daily and removes HS - received flu vaccine on 04/13/21 - BNP 01/22/21 was 237.3  2: HTN- - BP initially elevated (180/54) but had improved  upon recheck (148/48); could titrate up entresto at future visits - saw PCP Ancil Boozer) 02/09/21 - BMP 04/12/21 reviewed and showed sodium 139, potassium 4.5, creatinine 0.8 & GFR 75  3: DM- - A1c 03/23/21 was 7.1% - glucose at home today was 99 - saw endocrinology Honor Junes) 03/23/21  4: Paroxysmal atrial fibrillation- - successfully cardioverted 01/30/21 - saw cardiology (Dunn) 04/12/21 - [employer] patient assistance forms filled out at last visit   Medication bottles reviewed.   Return in 3 months or sooner for any questions/problems before then.

## 2021-05-09 ENCOUNTER — Other Ambulatory Visit: Payer: Self-pay

## 2021-05-09 ENCOUNTER — Encounter: Payer: Self-pay | Admitting: Family

## 2021-05-09 ENCOUNTER — Ambulatory Visit: Payer: Medicare Other | Attending: Family | Admitting: Family

## 2021-05-09 VITALS — BP 148/48 | HR 53 | Resp 18 | Ht 63.0 in | Wt 182.0 lb

## 2021-05-09 DIAGNOSIS — M5430 Sciatica, unspecified side: Secondary | ICD-10-CM | POA: Insufficient documentation

## 2021-05-09 DIAGNOSIS — Z7901 Long term (current) use of anticoagulants: Secondary | ICD-10-CM | POA: Diagnosis not present

## 2021-05-09 DIAGNOSIS — E785 Hyperlipidemia, unspecified: Secondary | ICD-10-CM | POA: Insufficient documentation

## 2021-05-09 DIAGNOSIS — R609 Edema, unspecified: Secondary | ICD-10-CM | POA: Insufficient documentation

## 2021-05-09 DIAGNOSIS — I5042 Chronic combined systolic (congestive) and diastolic (congestive) heart failure: Secondary | ICD-10-CM | POA: Insufficient documentation

## 2021-05-09 DIAGNOSIS — E1169 Type 2 diabetes mellitus with other specified complication: Secondary | ICD-10-CM

## 2021-05-09 DIAGNOSIS — I5032 Chronic diastolic (congestive) heart failure: Secondary | ICD-10-CM

## 2021-05-09 DIAGNOSIS — I480 Paroxysmal atrial fibrillation: Secondary | ICD-10-CM | POA: Diagnosis not present

## 2021-05-09 DIAGNOSIS — I10 Essential (primary) hypertension: Secondary | ICD-10-CM | POA: Diagnosis not present

## 2021-05-09 DIAGNOSIS — E1136 Type 2 diabetes mellitus with diabetic cataract: Secondary | ICD-10-CM | POA: Insufficient documentation

## 2021-05-09 DIAGNOSIS — I110 Hypertensive heart disease with heart failure: Secondary | ICD-10-CM | POA: Insufficient documentation

## 2021-05-09 DIAGNOSIS — K219 Gastro-esophageal reflux disease without esophagitis: Secondary | ICD-10-CM | POA: Insufficient documentation

## 2021-05-09 NOTE — Patient Instructions (Addendum)
Continue weighing daily and call for an overnight weight gain of > 2 pounds or a weekly weight gain of >5 pounds.    Do not take anymore losartan as you are taking entresto

## 2021-05-11 DIAGNOSIS — M9901 Segmental and somatic dysfunction of cervical region: Secondary | ICD-10-CM | POA: Diagnosis not present

## 2021-05-11 DIAGNOSIS — M9903 Segmental and somatic dysfunction of lumbar region: Secondary | ICD-10-CM | POA: Diagnosis not present

## 2021-05-11 DIAGNOSIS — M5416 Radiculopathy, lumbar region: Secondary | ICD-10-CM | POA: Diagnosis not present

## 2021-05-11 DIAGNOSIS — M5033 Other cervical disc degeneration, cervicothoracic region: Secondary | ICD-10-CM | POA: Diagnosis not present

## 2021-05-15 ENCOUNTER — Telehealth: Payer: Self-pay

## 2021-05-15 ENCOUNTER — Other Ambulatory Visit: Payer: Self-pay

## 2021-05-15 ENCOUNTER — Other Ambulatory Visit: Payer: Self-pay | Admitting: Family Medicine

## 2021-05-15 DIAGNOSIS — E1169 Type 2 diabetes mellitus with other specified complication: Secondary | ICD-10-CM

## 2021-05-15 DIAGNOSIS — I701 Atherosclerosis of renal artery: Secondary | ICD-10-CM

## 2021-05-15 MED ORDER — ROSUVASTATIN CALCIUM 10 MG PO TABS: 10.0000 mg | ORAL_TABLET | Freq: Every day | ORAL | 1 refills | Status: DC

## 2021-05-15 NOTE — Telephone Encounter (Signed)
Copied from [facility] [phone]. Topic: General - Inquiry >> May 15, 2021 10:16 AM Greggory Keen D wrote: Reason for CRM: Pt called saying she needs a new prescription for Rosuvastatin 10 mg pills an day at night and a 3 month supply.  She said she needs 90 for a 3 months supply.   Dr. Ancil Boozer changed her rx and she has ran out early.  [facility] [address]  CB#  [phone]

## 2021-05-16 ENCOUNTER — Other Ambulatory Visit: Payer: Self-pay

## 2021-05-16 ENCOUNTER — Encounter: Payer: Self-pay | Admitting: Podiatry

## 2021-05-16 ENCOUNTER — Telehealth: Payer: Self-pay

## 2021-05-16 ENCOUNTER — Ambulatory Visit (INDEPENDENT_AMBULATORY_CARE_PROVIDER_SITE_OTHER): Payer: Medicare Other | Admitting: Podiatry

## 2021-05-16 DIAGNOSIS — M205X2 Other deformities of toe(s) (acquired), left foot: Secondary | ICD-10-CM | POA: Diagnosis not present

## 2021-05-16 NOTE — Progress Notes (Addendum)
Chronic Care Management Pharmacy Assistant   Name: LASHAYE FISK  MRN: [id number] DOB: 1940-08-16  Patient called to be reminded of her face to face appointment with Junius Argyle, CPP on 11/16 @ 1145  I spoke with the patient, and she stated that she has another obligation at this time. I was able to get the patient rescheduled, but due to the CPP only being at this clinic one day out of the week the closest appointment at this time was 09/06/2021 @ 1400. Since this patient has not been seen by CPP since August I completed an HTN Assessment on the patient, and she did request if a cancellation becomes available for me to contact her.    Chronic Care Management Pharmacy Assistant   Name: QUINNLYN HEARNS  MRN: [id number] DOB: 1941/06/02  Reason for Encounter: Hypertension  Disease State Call   Recent office visits:  04/20/2021 Clemetine Marker, LPN (PCP Office) for Medicare Wellness Visit- Stopped: Fluticasone Propionate 50 mcg, patient instructed to return in 3 months  Recent consult visits:  05/09/2021 Otila Kluver A. Jackelyn Hoehn, [place] (Cardiology) for CHF- Stopped: Losartan Potassium 25 mg, and Losartan Potassium-HCTZ 100-25 mg   05/04/2021 [name], DPM (Podiatry) for Callouses- No medication changes noted, no orders placed, patient instructed to return in 4 months.  04/12/2021 [name], [name] (Cardiology) for Follow-up- Per note provider decreased Amiodarone to 100 mg daily due to patient's tremor. Lab order placed, patient instructed to follow-up in 3 months  Hospital visits:  None in previous 6 months  Medications: Outpatient Encounter Medications as of 05/16/2021  Medication Sig   acetaminophen (TYLENOL) 650 MG CR tablet Take 1,300 mg by mouth at bedtime.   ALFALFA PO Take 1 tablet by mouth daily.   amiodarone (PACERONE) 200 MG tablet Take 0.5 tablets (100 mg total) by mouth daily.   ascorbic Acid (VITAMIN C) 500 MG CPCR Take 500 mg by mouth daily.   B Complex-C (B-COMPLEX  WITH VITAMIN C) tablet Take 1 tablet by mouth 2 (two) times daily.   BD PEN NEEDLE NANO U/F 32G X 4 MM MISC USE 4 TIMES DAILY (HUMALOG 3 TIMES DAILY AND LANTUS ONCE DAILY) OFFICE NOTIFIED 08/06/17   BLACK ELDERBERRY,BERRY-FLOWER, PO Take 15 mLs by mouth daily. Liquid   Calcium Carbonate (CALCIUM 600 PO) Take 600 mg by mouth daily.   carvedilol (COREG) 3.125 MG tablet Take 1 tablet (3.125 mg total) by mouth 2 (two) times daily.   cholecalciferol (VITAMIN D) 1000 UNITS tablet Take 1,000 Units by mouth daily. Shaklee   clobetasol ointment (TEMOVATE) 3.24 % APPLY 1 APPLICATION TOPICALLY 2 TIMES DAILY AS NEEDED (BUG BITES).   Coenzyme Q10 (COQ10) 200 MG CAPS Take 200 mg by mouth daily.   Continuous Blood Gluc Sensor ([place]) MISC by Does not apply route.   Elastic Bandages & Supports (MEDICAL COMPRESSION STOCKINGS) MISC 1 each by Does not apply route daily.   ELIQUIS 5 MG TABS tablet TAKE 1 TABLET BY MOUTH TWICE A DAY   estradiol (ESTRACE VAGINAL) 0.1 MG/GM vaginal cream 1/2 gm once weekly using applicator, apply blueberry sized amount of cream using tip of finger to urethra twice weekly   Exenatide ER (BYDUREON BCISE) 2 MG/0.85ML AUIJ Inject 2 mg into the skin every Sunday.   furosemide (LASIX) 40 MG tablet Take 1 tablet (40 mg total) by mouth 2 (two) times daily.   GARLIC 4010 PO Take 2,725 mg by mouth daily.   Glucosamine 500 MG TABS Take  500 mg by mouth daily.   HUMALOG KWIKPEN 100 UNIT/ML KiwkPen Inject 4-12 Units into the skin 3 (three) times daily before meals. If needed sliding scale   insulin glargine (LANTUS) 100 unit/mL SOPN Inject 22 Units into the skin at bedtime.   Krill Oil 1000 MG CAPS Take 1,000 mg by mouth daily.   Mag Aspart-Potassium Aspart (POTASSIUM & MAGNESIUM ASPARTAT PO) Take 1 tablet by mouth daily.   Multiple Vitamins-Minerals (PRESERVISION AREDS 2) CAPS Take 1 capsule by mouth 2 (two) times daily.   OVER THE COUNTER MEDICATION Place 1 application into  both eyes See admin instructions. Blephadex eyelid foam, apply to eyelids once daily   OVER THE COUNTER MEDICATION Take 1 tablet by mouth daily. Vita-Lea Gold otc supplement With out vitamin K (Shaklee products)   OVER THE COUNTER MEDICATION Take 1 tablet by mouth See admin instructions. Nutriferon otc supplement take Take 1 tablet on Sun, Tues, Thurs, Sat. Take 1 tablet twice daily on Mon, Wed, and Fri   Probiotic Product (PROBIOTIC PO) Take 1 capsule by mouth at bedtime.   PROLIA 60 MG/ML SOSY injection Inject 60 mg into the skin every 6 (six) months.   Propylene Glycol (SYSTANE BALANCE OP) Place 1 drop into both eyes daily as needed (dry eyes).   rosuvastatin (CRESTOR) 10 MG tablet Take 1 tablet (10 mg total) by mouth daily.   sacubitril-valsartan (ENTRESTO) 24-26 MG Take 1 tablet by mouth 2 (two) times daily.   Sodium Chloride-Sodium Bicarb (NETI POT SINUS WASH NA) Place 1 Dose into the nose at bedtime.   tiZANidine (ZANAFLEX) 2 MG tablet Take 1 tablet (2 mg total) by mouth at bedtime.   Vitamin D, Ergocalciferol, (DRISDOL) 1.25 MG (50000 UT) CAPS capsule Take 50,000 Units by mouth every Saturday.   [DISCONTINUED] metoprolol succinate (TOPROL-XL) 50 MG 24 hr tablet Take 1 tablet (50 mg total) by mouth 2 (two) times daily. Take with or immediately following a meal. (Patient not taking: Reported on 01/23/2021)   No facility-administered encounter medications on file as of 05/16/2021.   Care Gaps: Diabetic Foot Exam- Patient had Podiatry appointment on 11/03  Star Rating Drugs: Rosuvastatin 10 mg last filled on 03/25/2021 for a 84-Day supply with [facility] Sacubitril-Valsartan 24-26 mg last filled on 03/25/2021 for a 30-Day supply with [facility]  Reviewed chart prior to disease state call. Spoke with patient regarding BP  Recent Office Vitals: BP Readings from Last 3 Encounters:  05/09/21 (!) 148/48  04/12/21 (!) 142/70  03/22/21 (!) 142/48   Pulse Readings from Last 3  Encounters:  05/09/21 (!) 53  04/12/21 66  03/22/21 (!) 56    Wt Readings from Last 3 Encounters:  05/09/21 182 lb (82.6 kg)  04/12/21 187 lb 3.2 oz (84.9 kg)  03/22/21 187 lb 8 oz (85 kg)     Kidney Function Lab Results  Component Value Date/Time   CREATININE 0.80 04/12/2021 01:54 PM   CREATININE 0.83 03/13/2021 10:42 AM   CREATININE 0.71 01/09/2021 08:36 AM   CREATININE 0.63 04/11/2018 10:32 AM   GFRNONAA >60 03/13/2021 10:42 AM   GFRNONAA 88 04/11/2018 10:32 AM   GFRAA 102 04/11/2018 10:32 AM    BMP Latest Ref Rng & Units 04/12/2021 03/13/2021 02/07/2021  Glucose 70 - 99 mg/dL 132(H) 171(H) 139(H)  BUN 8 - 27 mg/dL 14 16 14   Creatinine 0.57 - 1.00 mg/dL 0.80 0.83 0.94  BUN/Creat Ratio 12 - 28 18 - -  Sodium 134 - 144 mmol/L 139 139  132(L)  Potassium 3.5 - 5.2 mmol/L 4.5 3.7 4.3  Chloride 96 - 106 mmol/L 98 102 93(L)  CO2 20 - 29 mmol/L 23 27 30   Calcium 8.7 - 10.3 mg/dL 9.7 8.9 8.9    Current antihypertensive regimen:  Amiodarone 200 mg 0.5 tablets daily Lasix 40 mg 1 tablet twice daily Entresto 24-26 mg 1 tablet twice daily Carvedilol 3.125 mg 1 tablet twice daily  How often are you checking your Blood Pressure? 3-5x per week  Current home BP readings:  Patient wasn't home so she could not provide her last number. She did state the last appointment with Cardio it was a little elevated. She reports that when she's home most of the time her numbers are normal. She does get elevated numbers when she has been busy all day.   What recent interventions/DTPs have been made by any provider to improve Blood Pressure control since last CPP Visit:  Patient was recently discontinued from Losartan and her Amiodarone was changed from 200 mg daily to 100 mg daily due to it is causing her to have tremors  Any recent hospitalizations or ED visits since last visit with CPP? No  What diet changes have been made to improve Blood Pressure Control?  Patient reports that she does avoid  salt and sodium other than that her appetite is good  What exercise is being done to improve your Blood Pressure Control?  Patient does walk for about 30 minutes every day  Adherence Review: Is the patient currently on ACE/ARB medication? Yes Does the patient have >5 day gap between last estimated fill dates? Yes  I spoke with the patient, and she reports that she is feeling much better than she was. She does report experiencing some hair loss, and tremors from her Amiodarone. Patient states that she is dealing with these side effects, but the Cardiologist has decreased the amount of Amiodarone the patient is taking and is considering discontinuing the medication. Patient reports that she is back to her normal daily routine which she is very happy about. Patient denies any ill symptoms today, no weight gain, and she states that her blood sugar numbers have been doing well her average is 120 before a meal. Patient stated that she is back to taking her Rosuvastatin daily, and a prescription to reflect that change was called into her pharmacy yesterday she is waiting on them to contact her so she can pick the medication up. I advised her if [facility]'t [facility], or does not have the new prescription give me a call. Patient has no other concerns or issues today. Patient advised if she needs anything she can call me directly.    Lynann Bologna, CPA/CMA Clinical Pharmacist Assistant Phone: [phone]

## 2021-05-16 NOTE — Progress Notes (Signed)
  Subjective:  Patient ID: Bonnie Rowland, female    DOB: 29-Jul-1940,  MRN: [id number]  Chief Complaint  Patient presents with   Toe Pain    Left foot 3rd toe    80 y.o. female returns today for planned flexor tenotomy of the left third digit.  Objective:  There were no vitals filed for this visit.  General AA&O x3. Normal mood and affect.  Vascular Pedal pulses palpable.  Neurologic Epicritic sensation grossly intact.  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  Hammertoe third left with pre-ulcerative callus -Flexor tenotomy as below. -Advised to remove the dressing in 24 hours and apply a band-aid and triple abx ointment every day thereafter.  Procedure: Flexor Tenotomy Indication for Procedure: toe with semi-reducible hammertoe with distal tip ulceration. Flexor tenotomy indicated to alleviate contracture, reduce pressure, and enhance healing of the ulceration. Location: left, 3rd toe Anesthesia: Lidocaine 1% plain; 1.5 mL and Marcaine 0.5% plain; 1.5 mL digital block Instrumentation: 11 blade Technique: The toe was anesthetized as above and prepped in the usual fashion. The toe was exsanquinated and a tourniquet was secured at the base of the toe. An 11 blade needle was then used to percutaneously release the flexor tendon at the plantar surface of the toe with noted release of the hammertoe deformity.  The incision was closed with 3-0 Prolene.  The incision was then dressed with antibiotic ointment and band-aid. Compression splint dressing applied. Patient tolerated the procedure well. Dressing: Dry, sterile, compression dressing. Disposition: Patient tolerated procedure well. Patient to return in 1 week for follow-up.      No follow-ups on file.

## 2021-05-17 ENCOUNTER — Ambulatory Visit: Payer: Medicare Other

## 2021-05-18 ENCOUNTER — Telehealth: Payer: Self-pay | Admitting: Family

## 2021-05-18 ENCOUNTER — Ambulatory Visit: Payer: Medicare Other

## 2021-05-18 NOTE — Telephone Encounter (Signed)
LVM to notify patient we needed proof of income for her novart[employer] application as they are now requiring it for all patients.   Pershing Skidmore, NT

## 2021-05-24 DIAGNOSIS — M5033 Other cervical disc degeneration, cervicothoracic region: Secondary | ICD-10-CM | POA: Diagnosis not present

## 2021-05-24 DIAGNOSIS — M9903 Segmental and somatic dysfunction of lumbar region: Secondary | ICD-10-CM | POA: Diagnosis not present

## 2021-05-24 DIAGNOSIS — M5416 Radiculopathy, lumbar region: Secondary | ICD-10-CM | POA: Diagnosis not present

## 2021-05-24 DIAGNOSIS — M9901 Segmental and somatic dysfunction of cervical region: Secondary | ICD-10-CM | POA: Diagnosis not present

## 2021-05-30 ENCOUNTER — Other Ambulatory Visit: Payer: Self-pay

## 2021-05-30 ENCOUNTER — Ambulatory Visit (INDEPENDENT_AMBULATORY_CARE_PROVIDER_SITE_OTHER): Payer: Medicare Other | Admitting: Podiatry

## 2021-05-30 DIAGNOSIS — M205X2 Other deformities of toe(s) (acquired), left foot: Secondary | ICD-10-CM

## 2021-05-30 DIAGNOSIS — T8130XA Disruption of wound, unspecified, initial encounter: Secondary | ICD-10-CM

## 2021-05-30 MED ORDER — DOXYCYCLINE HYCLATE 100 MG PO TABS: 100.0000 mg | ORAL_TABLET | Freq: Two times a day (BID) | ORAL | 0 refills | Status: AC

## 2021-06-01 NOTE — Progress Notes (Signed)
Subjective:  Patient ID: Bonnie Rowland, female    DOB: 1941-03-18,  MRN: [id number]  Chief Complaint  Patient presents with   Toe Pain    Left foot 3rd toe     80 y.o. female presents with the above complaint.  Patient presents with follow-up to left third digit Lexer tenotomy.  Patient states that she is doing good.  The toe is sitting up much better.  She states that she has been taking care of the wound however she did get it wet.  She try to dry it up as quickly as she can.  She wanted get evaluated make sure that there is nothing going on.  She denies any other acute complaints.   Review of Systems: Negative except as noted in the HPI. Denies N/V/F/Ch.  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   Cataract 2003   Cellulitis and abscess of leg, except foot    Conjunctivitis unspecified    Dermatophytosis of nail    Diabetes mellitus    type II   Esophageal reflux    Hyperlipidemia    Hypertension    Osteoporosis 2016   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)    Renal artery stenosis (HCC)    Sprain of lumbar region    Thoracic or lumbosacral neuritis or radiculitis, unspecified    Urinary tract infection, site not specified     Current Outpatient Medications:    doxycycline (VIBRA-TABS) 100 MG tablet, Take 1 tablet (100 mg total) by mouth 2 (two) times daily for 14 days., Disp: 28 tablet, Rfl: 0   acetaminophen (TYLENOL) 650 MG CR tablet, Take 1,300 mg by mouth at bedtime., Disp: , Rfl:    ALFALFA PO, Take 1 tablet by mouth daily., Disp: , Rfl:    amiodarone (PACERONE)  200 MG tablet, Take 0.5 tablets (100 mg total) by mouth daily., Disp: 90 tablet, Rfl: 3   ascorbic Acid (VITAMIN C) 500 MG CPCR, Take 500 mg by mouth daily., Disp: , Rfl:    B Complex-C (B-COMPLEX WITH VITAMIN C) tablet, Take 1 tablet by mouth 2 (two) times daily., Disp: , Rfl:    BD PEN NEEDLE NANO U/F 32G X 4 MM MISC, USE 4 TIMES DAILY (HUMALOG 3 TIMES DAILY AND LANTUS ONCE DAILY) OFFICE NOTIFIED 08/06/17, Disp: 90 each, Rfl: 1   BLACK ELDERBERRY,BERRY-FLOWER, PO, Take 15 mLs by mouth daily. Liquid, Disp: , Rfl:    Calcium Carbonate (CALCIUM 600 PO), Take 600 mg by mouth daily., Disp: , Rfl:    carvedilol (COREG) 3.125 MG tablet, Take 1 tablet (3.125 mg total) by mouth 2 (two) times daily., Disp: 180 tablet, Rfl: 3   cholecalciferol (VITAMIN D) 1000 UNITS tablet, Take 1,000 Units by mouth daily. Shaklee, Disp: , Rfl:    clobetasol ointment (TEMOVATE) 8.92 %, APPLY 1 APPLICATION TOPICALLY 2 TIMES DAILY AS NEEDED (BUG BITES)., Disp: 30 g, Rfl: 0   Coenzyme Q10 (COQ10) 200 MG CAPS, Take 200 mg by mouth daily., Disp: , Rfl:    Continuous Blood  Gluc Sensor ([place]) MISC, by Does not apply route., Disp: , Rfl:    Elastic Bandages & Supports ([place]) MISC, 1 each by Does not apply route daily., Disp: 2 each, Rfl: 5   ELIQUIS 5 MG TABS tablet, TAKE 1 TABLET BY MOUTH TWICE A DAY, Disp: 180 tablet, Rfl: 1   estradiol (ESTRACE VAGINAL) 0.1 MG/GM vaginal cream, 1/2 gm once weekly using applicator, apply blueberry sized amount of cream using tip of finger to urethra twice weekly, Disp: 30 g, Rfl: 3   Exenatide ER (BYDUREON BCISE) 2 MG/0.85ML AUIJ, Inject 2 mg into the skin every Sunday., Disp: , Rfl:    furosemide (LASIX) 40 MG tablet, Take 1 tablet (40 mg total) by mouth 2 (two) times daily., Disp: 60 tablet, Rfl: 5   GARLIC 0017 PO, Take 4,944 mg by mouth daily., Disp: , Rfl:    Glucosamine 500 MG TABS, Take 500 mg by mouth daily., Disp: , Rfl:    HUMALOG KWIKPEN  100 UNIT/ML KiwkPen, Inject 4-12 Units into the skin 3 (three) times daily before meals. If needed sliding scale, Disp: , Rfl: 3   insulin glargine (LANTUS) 100 unit/mL SOPN, Inject 22 Units into the skin at bedtime., Disp: , Rfl:    Krill Oil 1000 MG CAPS, Take 1,000 mg by mouth daily., Disp: , Rfl:    Mag Aspart-Potassium Aspart (POTASSIUM & MAGNESIUM ASPARTAT PO), Take 1 tablet by mouth daily., Disp: , Rfl:    Multiple Vitamins-Minerals (PRESERVISION AREDS 2) CAPS, Take 1 capsule by mouth 2 (two) times daily., Disp: , Rfl:    OVER THE COUNTER MEDICATION, Place 1 application into both eyes See admin instructions. Blephadex eyelid foam, apply to eyelids once daily, Disp: , Rfl:    OVER THE COUNTER MEDICATION, Take 1 tablet by mouth daily. Vita-Lea Gold otc supplement With out vitamin K (Shaklee products), Disp: , Rfl:    OVER THE COUNTER MEDICATION, Take 1 tablet by mouth See admin instructions. Nutriferon otc supplement take Take 1 tablet on Sun, Tues, Thurs, Sat. Take 1 tablet twice daily on Mon, Wed, and Fri, Disp: , Rfl:    Probiotic Product (PROBIOTIC PO), Take 1 capsule by mouth at bedtime., Disp: , Rfl:    PROLIA 60 MG/ML SOSY injection, Inject 60 mg into the skin every 6 (six) months., Disp: , Rfl:    Propylene Glycol (SYSTANE BALANCE OP), Place 1 drop into both eyes daily as needed (dry eyes)., Disp: , Rfl:    rosuvastatin (CRESTOR) 10 MG tablet, Take 1 tablet (10 mg total) by mouth daily., Disp: 90 tablet, Rfl: 1   sacubitril-valsartan (ENTRESTO) 24-26 MG, Take 1 tablet by mouth 2 (two) times daily., Disp: 60 tablet, Rfl: 3   Sodium Chloride-Sodium Bicarb (NETI POT SINUS WASH NA), Place 1 Dose into the nose at bedtime., Disp: , Rfl:    tiZANidine (ZANAFLEX) 2 MG tablet, Take 1 tablet (2 mg total) by mouth at bedtime., Disp: 90 tablet, Rfl: 1   Vitamin D, Ergocalciferol, (DRISDOL) 1.25 MG (50000 UT) CAPS capsule, Take 50,000 Units by mouth every Saturday., Disp: , Rfl:   Social History    Tobacco Use  Smoking Status Never  Smokeless Tobacco Never  Tobacco Comments   Smoking cessation materials not required    Allergies  Allergen Reactions   Cyclobenzaprine Hypertension   Cheese Other (See Comments)    bloating   Ciprofloxacin Hcl Other (See Comments)    Muscle pain   Coconut Oil  Upset stomach   Keflex [Cephalexin] Other (See Comments)    Patient prefers not to take this medication due to the side effects, caused tendon issues   Objective:  There were no vitals filed for this visit. There is no height or weight on file to calculate BMI. Constitutional Well developed. Well nourished.  Vascular Dorsalis pedis pulses palpable bilaterally. Posterior tibial pulses palpable bilaterally. Capillary refill normal to all digits.  No cyanosis or clubbing noted. Pedal hair growth normal.  Neurologic Normal speech. Oriented to person, place, and time. Epicritic sensation to light touch grossly present bilaterally.  Dermatologic Left third digit dehiscence superficial noted.  Does not probe down to bone.  Mild erythema noted.  No purulent drainage noted.  No other clinical signs of infection noted   Orthopedic: Normal joint ROM without pain or crepitus bilaterally. No visible deformities. No bony tenderness.   Radiographs: None Assessment:   1. Toe contracture, left   2. Wound dehiscence    Plan:  Patient was evaluated and treated and all questions answered.  Left third digit superficial wound dehiscence with a history of flexor tenotomy of the third digit -All questions and concerns were discussed with the patient in extensive detail. -At this time I discussed with her that the wound is superficial in nature does not probe down to deep tissue however it can always regressed and can lead to bone infection loss of toe.  I discussed this with her in extensive detail she states understanding. -I will place her on prophylactic antibiotics to help prevent  infection.  Doxycycline was sent to the pharmacy. -Betadine wet-to-dry dressing daily. -Continue use surgical shoe -I discussed with her that her cellulitis or streaking I discussed with her go to the emergency room right away.  She states understanding.   No follow-ups on file.

## 2021-06-08 DIAGNOSIS — M9901 Segmental and somatic dysfunction of cervical region: Secondary | ICD-10-CM | POA: Diagnosis not present

## 2021-06-08 DIAGNOSIS — M5033 Other cervical disc degeneration, cervicothoracic region: Secondary | ICD-10-CM | POA: Diagnosis not present

## 2021-06-08 DIAGNOSIS — M9903 Segmental and somatic dysfunction of lumbar region: Secondary | ICD-10-CM | POA: Diagnosis not present

## 2021-06-08 DIAGNOSIS — M5416 Radiculopathy, lumbar region: Secondary | ICD-10-CM | POA: Diagnosis not present

## 2021-06-15 NOTE — Progress Notes (Deleted)
06/16/2021 9:11 PM   Bonnie Rowland [birth year] [id number]  Referring provider: Steele Sizer, MD [address] [place],  [place] [address]  No chief complaint on file.  Urological history: 1. Urge incontinence -contributing factors of age, obesity, caffeine, diabetes, vaginal atrophy (using vaginal estrogen cream), ACE inhibitors, alpha blockers, antiarrhythmics and diuretics -PVR *** -Managed with poise pads  2. Renal stone -contrast CT 2021 - 3 mm left upper renal stone  3. Renal cyst -contrast CT 2021 - 12 mm left renal inferior pole cyst  HPI: Bonnie Rowland is a 80 y.o. female who presents today for yearly visit.    PVR ***   PMH: Past Medical History:  Diagnosis Date   Acute cystitis    Acute on chronic combined systolic and diastolic CHF (congestive heart failure) (HCC)    Allergic rhinitis, cause unspecified    Arthritis 2015   Atherosclerosis of renal artery (HCC)    left   Atrial fibrillation (HCC)    Bronchitis, not specified as acute or chronic    Cancer ([place]) 12/2013   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; left shoulder blade   Cancer ([place]) 05/2014   basal cell removed left temple   Cataract 2003   Cellulitis and abscess of leg, except foot    Conjunctivitis unspecified    Dermatophytosis of nail    Diabetes mellitus    type II   Esophageal reflux    Hyperlipidemia    Hypertension    Osteoporosis 2016   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)    Renal artery stenosis (HCC)    Sprain of lumbar region    Thoracic or lumbosacral neuritis or radiculitis, unspecified    Urinary tract infection, site not specified     Surgical History: Past Surgical History:  Procedure Laterality Date   APPENDECTOMY     CARDIAC CATHETERIZATION     CARDIOVERSION N/A 11/30/2020   Procedure: CARDIOVERSION;  Surgeon: Minna Merritts, MD;  Location: [facility];  Service: Cardiovascular;  Laterality: N/A;   CARDIOVERSION N/A 01/20/2021   Procedure: CARDIOVERSION;  Surgeon:  Minna Merritts, MD;  Location: [facility];  Service: Cardiovascular;  Laterality: N/A;   CARDIOVERSION N/A 01/30/2021   Procedure: CARDIOVERSION;  Surgeon: Wellington Hampshire, MD;  Location: [facility];  Service: Cardiovascular;  Laterality: N/A;   cataract surgery     COLONOSCOPY     COLONOSCOPY WITH PROPOFOL N/A 05/09/2016   Procedure: COLONOSCOPY WITH PROPOFOL;  Surgeon: Robert Bellow, MD;  Location: [facility];  Service: Endoscopy;  Laterality: N/A;   DIAGNOSTIC MAMMOGRAM     EYE SURGERY Bilateral    Cataract Extraction   JOINT REPLACEMENT  2016   KNEE ARTHROPLASTY Right 03/30/2015   Procedure: COMPUTER ASSISTED TOTAL KNEE ARTHROPLASTY;  Surgeon: Dereck Leep, MD;  Location: [facility];  Service: Orthopedics;  Laterality: Right;   KNEE ARTHROSCOPY Right    KNEE CLOSED REDUCTION Right 05/23/2015   Procedure: CLOSED MANIPULATION KNEE;  Surgeon: Dereck Leep, MD;  Location: [facility];  Service: Orthopedics;  Laterality: Right;   RIGHT/LEFT HEART CATH AND CORONARY ANGIOGRAPHY N/A 01/25/2021   Procedure: RIGHT/LEFT HEART CATH AND CORONARY ANGIOGRAPHY;  Surgeon: Nelva Bush, MD;  Location: [facility];  Service: Cardiovascular;  Laterality: N/A;   TONSILLECTOMY      Home Medications:  Allergies as of 06/16/2021       Reactions   Cyclobenzaprine Hypertension   Cheese Other (See Comments)   bloating   Ciprofloxacin Hcl Other (See Comments)  Muscle pain   Coconut Oil    Upset stomach   Keflex [cephalexin] Other (See Comments)   Patient prefers not to take this medication due to the side effects, caused tendon issues        Medication List        Accurate as of June 15, 2021  9:11 PM. If you have any questions, ask your nurse or doctor.          acetaminophen 650 MG CR tablet Commonly known as: TYLENOL Take 1,300 mg by mouth at bedtime.   ALFALFA PO Take 1 tablet by mouth daily.   amiodarone 200 MG tablet Commonly known as: PACERONE Take 0.5  tablets (100 mg total) by mouth daily.   ascorbic Acid 500 MG Cpcr Commonly known as: VITAMIN C Take 500 mg by mouth daily.   B-complex with vitamin C tablet Take 1 tablet by mouth 2 (two) times daily.   BD Pen Needle Nano U/F 32G X 4 MM Misc Generic drug: Insulin Pen Needle USE 4 TIMES DAILY (HUMALOG 3 TIMES DAILY AND LANTUS ONCE DAILY) OFFICE NOTIFIED 08/06/17   BLACK ELDERBERRY(BERRY-FLOWER) PO Take 15 mLs by mouth daily. Liquid   Bydureon BCise 2 MG/0.85ML Auij Generic drug: Exenatide ER Inject 2 mg into the skin every Sunday.   CALCIUM 600 PO Take 600 mg by mouth daily.   carvedilol 3.125 MG tablet Commonly known as: COREG Take 1 tablet (3.125 mg total) by mouth 2 (two) times daily.   cholecalciferol 1000 units tablet Commonly known as: VITAMIN D Take 1,000 Units by mouth daily. Shaklee   clobetasol ointment 0.05 % Commonly known as: TEMOVATE APPLY 1 APPLICATION TOPICALLY 2 TIMES DAILY AS NEEDED (BUG BITES).   CoQ10 200 MG Caps Take 200 mg by mouth daily.   Eliquis 5 MG Tabs tablet Generic drug: apixaban TAKE 1 TABLET BY MOUTH TWICE A DAY   estradiol 0.1 MG/GM vaginal cream Commonly known as: ESTRACE VAGINAL 1/2 gm once weekly using applicator, apply blueberry sized amount of cream using tip of finger to urethra twice weekly   [place] by Does not apply route.   furosemide 40 MG tablet Commonly known as: LASIX Take 1 tablet (40 mg total) by mouth 2 (two) times daily.   GARLIC 5035 PO Take 4,656 mg by mouth daily.   Glucosamine 500 MG Tabs Take 500 mg by mouth daily.   HumaLOG KwikPen 100 UNIT/ML KwikPen Generic drug: insulin lispro Inject 4-12 Units into the skin 3 (three) times daily before meals. If needed sliding scale   insulin glargine 100 unit/mL Sopn Commonly known as: LANTUS Inject 22 Units into the skin at bedtime.   Krill Oil 1000 MG Caps Take 1,000 mg by mouth daily.   Medical Compression Stockings Misc 1  each by Does not apply route daily.   NETI POT SINUS [place] NA Place 1 Dose into the nose at bedtime.   OVER THE COUNTER MEDICATION Place 1 application into both eyes See admin instructions. Blephadex eyelid foam, apply to eyelids once daily   OVER THE COUNTER MEDICATION Take 1 tablet by mouth daily. Vita-Lea Gold otc supplement With out vitamin K (Shaklee products)   OVER THE COUNTER MEDICATION Take 1 tablet by mouth See admin instructions. Nutriferon otc supplement take Take 1 tablet on Sun, Tues, Thurs, Sat. Take 1 tablet twice daily on Mon, Wed, and Fri   POTASSIUM & MAGNESIUM ASPARTAT PO Take 1 tablet by mouth daily.   PreserVision AREDS  2 Caps Take 1 capsule by mouth 2 (two) times daily.   PROBIOTIC PO Take 1 capsule by mouth at bedtime.   Prolia 60 MG/ML Sosy injection Generic drug: denosumab Inject 60 mg into the skin every 6 (six) months.   rosuvastatin 10 MG tablet Commonly known as: CRESTOR Take 1 tablet (10 mg total) by mouth daily.   sacubitril-valsartan 24-26 MG Commonly known as: ENTRESTO Take 1 tablet by mouth 2 (two) times daily.   SYSTANE BALANCE OP Place 1 drop into both eyes daily as needed (dry eyes).   tiZANidine 2 MG tablet Commonly known as: ZANAFLEX Take 1 tablet (2 mg total) by mouth at bedtime.   Vitamin D (Ergocalciferol) 1.25 MG (50000 UNIT) Caps capsule Commonly known as: DRISDOL Take 50,000 Units by mouth every Saturday.        Allergies:  Allergies  Allergen Reactions   Cyclobenzaprine Hypertension   Cheese Other (See Comments)    bloating   Ciprofloxacin Hcl Other (See Comments)    Muscle pain   Coconut Oil     Upset stomach   Keflex [Cephalexin] Other (See Comments)    Patient prefers not to take this medication due to the side effects, caused tendon issues    Family History: Family History  Problem Relation Age of Onset   Diabetes Mother    Hypertension Mother    Cancer Mother    Kidney disease Mother     Hypertension Father    Cancer Father        Prostate   Breast cancer Maternal Aunt 66   Colon cancer Son    Kidney cancer Neg Hx    Bladder Cancer Neg Hx     Social History:  reports that she has never smoked. She has never used smokeless tobacco. She reports that she does not currently use alcohol after a past usage of about 1.0 standard drink per week. She reports that she does not use drugs.  ROS: For pertinent review of systems please refer to history of present illness  Physical Exam: There were no vitals taken for this visit.  Constitutional:  Well nourished. Alert and oriented, No acute distress. HEENT: [place] AT, moist mucus membranes.  Trachea midline, no masses. Cardiovascular: No clubbing, cyanosis, or edema. Respiratory: Normal respiratory effort, no increased work of breathing. GI: Abdomen is soft, non tender, non distended, no abdominal masses. Liver and spleen not palpable.  No hernias appreciated.  Stool sample for occult testing is not indicated.   GU: No CVA tenderness.  No bladder fullness or masses.  *** external genitalia, *** pubic hair distribution, no lesions.  Normal urethral meatus, no lesions, no prolapse, no discharge.   No urethral masses, tenderness and/or tenderness. No bladder fullness, tenderness or masses. *** vagina mucosa, *** estrogen effect, no discharge, no lesions, *** pelvic support, *** cystocele and *** rectocele noted.  No cervical motion tenderness.  Uterus is freely mobile and non-fixed.  No adnexal/parametria masses or tenderness noted.  Anus and perineum are without rashes or lesions.   ***  Skin: No rashes, bruises or suspicious lesions. Lymph: No cervical or inguinal adenopathy. Neurologic: Grossly intact, no focal deficits, moving all 4 extremities. Psychiatric: Normal mood and affect.    Laboratory Data: Component     Latest Ref Rng & Units 04/12/2021  Glucose     70 - 99 mg/dL 132 (H)  BUN     8 - 27 mg/dL 14  Creatinine     0.57 -  1.00 mg/dL 0.80  eGFR     >59 mL/min/1.73 75  BUN/Creatinine Ratio     12 - 28 18  Sodium     134 - 144 mmol/L 139  Potassium     3.5 - 5.2 mmol/L 4.5  Chloride     96 - 106 mmol/L 98  CO2     20 - 29 mmol/L 23  Calcium     8.7 - 10.3 mg/dL 9.7  Total Protein     6.0 - 8.5 g/dL 6.9  Albumin     3.7 - 4.7 g/dL 4.3  Globulin, Total     1.5 - 4.5 g/dL 2.6  Albumin/Globulin Ratio     1.2 - 2.2 1.7  Total Bilirubin     0.0 - 1.2 mg/dL 0.7  Alkaline Phosphatase     44 - 121 IU/L 78  AST     0 - 40 IU/L 31  ALT     0 - 32 IU/L 20   Hemoglobin A1C 4.2 - 5.6 % 7.1 High    Average Blood Glucose (Calc) mg/dL 157   Resulting Agency  [facility]  Narrative Performed by [facility] Normal Range:    4.2 - 5.6%  Increased Risk:  5.7 - 6.4%  Diabetes:        >= 6.5%  Glycemic Control for adults with diabetes:  <7%   Specimen Collected: 03/23/21 10:00 Last Resulted: 03/23/21 10:12  Received From: [facility]  Result Received: 03/24/21 16:13   Component     Latest Ref Rng & Units 01/31/2021  WBC     4.0 - 10.5 K/uL 8.4  RBC     3.87 - 5.11 MIL/uL 4.34  Hemoglobin     12.0 - 15.0 g/dL 13.9  HCT     36.0 - 46.0 % 41.1  MCV     80.0 - 100.0 fL 94.7  MCH     26.0 - 34.0 pg 32.0  MCHC     30.0 - 36.0 g/dL 33.8  RDW     11.5 - 15.5 % 12.6  Platelets     150 - 400 K/uL 285  MPV     7.5 - 12.5 fL   NEUT#     1,500 - 7,800 cells/uL   Lymphocyte #     850 - 3,900 cells/uL   WBC mixed population     200 - 950 cells/uL   Eosinophils Absolute     15 - 500 cells/uL   Basophils Absolute     0 - 200 cells/uL   Neutrophils     %   Total Lymphocyte     %   Monocytes Relative     %   Eosinophil     %   Basophil     %   nRBC     0.0 - 0.2 % 0.0  WBC, UA     0 - 5 /hpf   Epithelial Cells (non renal)     0 - 10 /hpf   Bacteria, UA     None seen/Few   I have reviewed the labs.     Pertinent  Imaging ***    Assessment and plan:  1.  Urge incontinence Patient is managing her incontinence conservatively with POISE pads  2 Tinea cruris  Patient given refill on nystatin and Diflucan cream  3. Vaginal Atrophy Continue to apply the vaginal estrogen cream 3 nights weekly Refill given  No follow-ups on file.  These notes generated with voice recognition software. I apologize for typographical errors.  Zara Council, [name]  [facility] [address] [place], [place] [address] [phone]

## 2021-06-16 ENCOUNTER — Ambulatory Visit: Payer: Self-pay | Admitting: Urology

## 2021-06-20 ENCOUNTER — Ambulatory Visit: Payer: Medicare Other | Admitting: Podiatry

## 2021-06-22 ENCOUNTER — Other Ambulatory Visit: Payer: Self-pay

## 2021-06-22 ENCOUNTER — Ambulatory Visit (INDEPENDENT_AMBULATORY_CARE_PROVIDER_SITE_OTHER): Payer: Medicare Other | Admitting: Podiatry

## 2021-06-22 DIAGNOSIS — M205X2 Other deformities of toe(s) (acquired), left foot: Secondary | ICD-10-CM

## 2021-06-22 DIAGNOSIS — T8130XA Disruption of wound, unspecified, initial encounter: Secondary | ICD-10-CM

## 2021-06-22 MED ORDER — DOXYCYCLINE HYCLATE 100 MG PO TABS: 100.0000 mg | ORAL_TABLET | Freq: Two times a day (BID) | ORAL | 0 refills | Status: DC

## 2021-06-22 NOTE — Progress Notes (Signed)
Subjective:  Patient ID: Bonnie Rowland, female    DOB: [birth year],  MRN: [id number]  Chief Complaint  Patient presents with   Toe Pain    Left foot 3rd toe     80 y.o. female presents with the above complaint.  Patient presents with follow-up to left third digit Lexer tenotomy.  Patient states that she is doing good.  The toe is sitting up much better.  She has been doing some Betadine dressings.  But she has not been doing it very aggressively.  She has been painting with Betadine and not leaving it on.  I encouraged her to leave the Betadine on there.  She states understanding will do so.   Review of Systems: Negative except as noted in the HPI. Denies N/V/F/Ch.  Past Medical History:  Diagnosis Date   Acute cystitis    Acute on chronic combined systolic and diastolic CHF (congestive heart failure) (HCC)    Allergic rhinitis, cause unspecified    Arthritis 2015   Atherosclerosis of renal artery (HCC)    left   Atrial fibrillation (HCC)    Bronchitis, not specified as acute or chronic    Cancer ([place]) 12/2013   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; left shoulder blade   Cancer ([place]) 05/2014   basal cell removed left temple   Cataract 2003   Cellulitis and abscess of leg, except foot    Conjunctivitis unspecified    Dermatophytosis of nail    Diabetes mellitus    type II   Esophageal reflux    Hyperlipidemia    Hypertension    Osteoporosis 2016   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)    Renal artery stenosis (HCC)    Sprain of lumbar region    Thoracic or lumbosacral neuritis or radiculitis, unspecified    Urinary tract infection, site not specified     Current Outpatient Medications:    doxycycline (VIBRA-TABS) 100 MG tablet, Take 1 tablet (100 mg total) by mouth 2 (two) times daily., Disp: 60 tablet, Rfl: 0   acetaminophen (TYLENOL) 650 MG CR tablet, Take 1,300 mg by mouth at bedtime., Disp: , Rfl:    ALFALFA PO, Take 1 tablet by mouth daily., Disp: , Rfl:    amiodarone (PACERONE) 200 MG  tablet, Take 0.5 tablets (100 mg total) by mouth daily., Disp: 90 tablet, Rfl: 3   ascorbic Acid (VITAMIN C) 500 MG CPCR, Take 500 mg by mouth daily., Disp: , Rfl:    B Complex-C (B-COMPLEX WITH VITAMIN C) tablet, Take 1 tablet by mouth 2 (two) times daily., Disp: , Rfl:    BD PEN NEEDLE NANO U/F 32G X 4 MM MISC, USE 4 TIMES DAILY (HUMALOG 3 TIMES DAILY AND LANTUS ONCE DAILY) OFFICE NOTIFIED 08/06/17, Disp: 90 each, Rfl: 1   BLACK ELDERBERRY,BERRY-FLOWER, PO, Take 15 mLs by mouth daily. Liquid, Disp: , Rfl:    Calcium Carbonate (CALCIUM 600 PO), Take 600 mg by mouth daily., Disp: , Rfl:    carvedilol (COREG) 3.125 MG tablet, Take 1 tablet (3.125 mg total) by mouth 2 (two) times daily., Disp: 180 tablet, Rfl: 3   cholecalciferol (VITAMIN D) 1000 UNITS tablet, Take 1,000 Units by mouth daily. Shaklee, Disp: , Rfl:    clobetasol ointment (TEMOVATE) 2.84 %, APPLY 1 APPLICATION TOPICALLY 2 TIMES DAILY AS NEEDED (BUG BITES)., Disp: 30 g, Rfl: 0   Coenzyme Q10 (COQ10) 200 MG CAPS, Take 200 mg by mouth daily., Disp: , Rfl:    Continuous Blood Gluc Sensor ([place])  MISC, by Does not apply route., Disp: , Rfl:    Elastic Bandages & Supports ([place]) MISC, 1 each by Does not apply route daily., Disp: 2 each, Rfl: 5   ELIQUIS 5 MG TABS tablet, TAKE 1 TABLET BY MOUTH TWICE A DAY, Disp: 180 tablet, Rfl: 1   estradiol (ESTRACE VAGINAL) 0.1 MG/GM vaginal cream, 1/2 gm once weekly using applicator, apply blueberry sized amount of cream using tip of finger to urethra twice weekly, Disp: 30 g, Rfl: 3   Exenatide ER (BYDUREON BCISE) 2 MG/0.85ML AUIJ, Inject 2 mg into the skin every Sunday., Disp: , Rfl:    furosemide (LASIX) 40 MG tablet, Take 1 tablet (40 mg total) by mouth 2 (two) times daily., Disp: 60 tablet, Rfl: 5   GARLIC 0272 PO, Take 5,366 mg by mouth daily., Disp: , Rfl:    Glucosamine 500 MG TABS, Take 500 mg by mouth daily., Disp: , Rfl:    HUMALOG KWIKPEN 100  UNIT/ML KiwkPen, Inject 4-12 Units into the skin 3 (three) times daily before meals. If needed sliding scale, Disp: , Rfl: 3   insulin glargine (LANTUS) 100 unit/mL SOPN, Inject 22 Units into the skin at bedtime., Disp: , Rfl:    Krill Oil 1000 MG CAPS, Take 1,000 mg by mouth daily., Disp: , Rfl:    Mag Aspart-Potassium Aspart (POTASSIUM & MAGNESIUM ASPARTAT PO), Take 1 tablet by mouth daily., Disp: , Rfl:    Multiple Vitamins-Minerals (PRESERVISION AREDS 2) CAPS, Take 1 capsule by mouth 2 (two) times daily., Disp: , Rfl:    OVER THE COUNTER MEDICATION, Place 1 application into both eyes See admin instructions. Blephadex eyelid foam, apply to eyelids once daily, Disp: , Rfl:    OVER THE COUNTER MEDICATION, Take 1 tablet by mouth daily. Vita-Lea Gold otc supplement With out vitamin K (Shaklee products), Disp: , Rfl:    OVER THE COUNTER MEDICATION, Take 1 tablet by mouth See admin instructions. Nutriferon otc supplement take Take 1 tablet on Sun, Tues, Thurs, Sat. Take 1 tablet twice daily on Mon, Wed, and Fri, Disp: , Rfl:    Probiotic Product (PROBIOTIC PO), Take 1 capsule by mouth at bedtime., Disp: , Rfl:    PROLIA 60 MG/ML SOSY injection, Inject 60 mg into the skin every 6 (six) months., Disp: , Rfl:    Propylene Glycol (SYSTANE BALANCE OP), Place 1 drop into both eyes daily as needed (dry eyes)., Disp: , Rfl:    rosuvastatin (CRESTOR) 10 MG tablet, Take 1 tablet (10 mg total) by mouth daily., Disp: 90 tablet, Rfl: 1   sacubitril-valsartan (ENTRESTO) 24-26 MG, Take 1 tablet by mouth 2 (two) times daily., Disp: 60 tablet, Rfl: 3   Sodium Chloride-Sodium Bicarb (NETI POT SINUS [place] NA), Place 1 Dose into the nose at bedtime., Disp: , Rfl:    tiZANidine (ZANAFLEX) 2 MG tablet, Take 1 tablet (2 mg total) by mouth at bedtime., Disp: 90 tablet, Rfl: 1   Vitamin D, Ergocalciferol, (DRISDOL) 1.25 MG (50000 UT) CAPS capsule, Take 50,000 Units by mouth every Saturday., Disp: , Rfl:   Social History    Tobacco Use  Smoking Status Never  Smokeless Tobacco Never  Tobacco Comments   Smoking cessation materials not required    Allergies  Allergen Reactions   Cyclobenzaprine Hypertension   Cheese Other (See Comments)    bloating   Ciprofloxacin Hcl Other (See Comments)    Muscle pain   Coconut Oil     Upset stomach  Keflex [Cephalexin] Other (See Comments)    Patient prefers not to take this medication due to the side effects, caused tendon issues   Objective:  There were no vitals filed for this visit. There is no height or weight on file to calculate BMI. Constitutional Well developed. Well nourished.  Vascular Dorsalis pedis pulses palpable bilaterally. Posterior tibial pulses palpable bilaterally. Capillary refill normal to all digits.  No cyanosis or clubbing noted. Pedal hair growth normal.  Neurologic Normal speech. Oriented to person, place, and time. Epicritic sensation to light touch grossly present bilaterally.  Dermatologic Left third digit dehiscence superficial noted.  Does not probe down to bone.  No purulent drainage noted no erythema noted.  No purulent drainage noted.  No other clinical signs of infection noted   Orthopedic: Normal joint ROM without pain or crepitus bilaterally. No visible deformities. No bony tenderness.   Radiographs: None Assessment:   No diagnosis found.  Plan:  Patient was evaluated and treated and all questions answered.  Left third digit superficial wound dehiscence with a history of flexor tenotomy of the third digit -All questions and concerns were discussed with the patient in extensive detail. -At this time I discussed with her that the wound is superficial in nature does not probe down to deep tissue however it can always regressed and can lead to bone infection loss of toe.  I discussed this with her in extensive detail she states understanding. -Continue doxycycline until resolve meant. -Betadine wet-to-dry dressing  daily. -Continue use surgical shoe -I discussed with her that her if she develops any cellulitis or streaking I discussed with her go to the emergency room right away.  She states understanding.   No follow-ups on file.

## 2021-06-29 ENCOUNTER — Ambulatory Visit (INDEPENDENT_AMBULATORY_CARE_PROVIDER_SITE_OTHER): Payer: Medicare Other | Admitting: Podiatry

## 2021-06-29 ENCOUNTER — Other Ambulatory Visit: Payer: Self-pay

## 2021-06-29 DIAGNOSIS — M205X2 Other deformities of toe(s) (acquired), left foot: Secondary | ICD-10-CM

## 2021-06-29 DIAGNOSIS — T8130XA Disruption of wound, unspecified, initial encounter: Secondary | ICD-10-CM

## 2021-07-04 NOTE — Progress Notes (Signed)
Subjective:  Patient ID: Bonnie Rowland, female    DOB: 01/01/1941,  MRN: [id number]  Chief Complaint  Patient presents with   Toe Pain    Left foot 3rd toe  Pt wants to make sure her toe is not infected     81 y.o. female presents with the above complaint.  Patient presents with follow-up to left third digit Lexer tenotomy.  She states she is doing good.  She states is getting a little bit better her pain is improving.  She has been doing Betadine wet-to-dry dressing changes.  She denies any other acute complaints.   Review of Systems: Negative except as noted in the HPI. Denies N/V/F/Ch.  Past Medical History:  Diagnosis Date   Acute cystitis    Acute on chronic combined systolic and diastolic CHF (congestive heart failure) (HCC)    Allergic rhinitis, cause unspecified    Arthritis 2015   Atherosclerosis of renal artery (HCC)    left   Atrial fibrillation (HCC)    Bronchitis, not specified as acute or chronic    Cancer ([place]) 12/2013   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; left shoulder blade   Cancer ([place]) 05/2014   basal cell removed left temple   Cataract 2003   Cellulitis and abscess of leg, except foot    Conjunctivitis unspecified    Dermatophytosis of nail    Diabetes mellitus    type II   Esophageal reflux    Hyperlipidemia    Hypertension    Osteoporosis 2016   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)    Renal artery stenosis (HCC)    Sprain of lumbar region    Thoracic or lumbosacral neuritis or radiculitis, unspecified    Urinary tract infection, site not specified     Current Outpatient Medications:    acetaminophen (TYLENOL) 650 MG CR tablet, Take 1,300 mg by mouth at bedtime., Disp: , Rfl:    ALFALFA PO, Take 1 tablet by mouth daily., Disp: , Rfl:    amiodarone (PACERONE) 200 MG tablet, Take 0.5 tablets (100 mg total) by mouth daily., Disp: 90 tablet, Rfl: 3   ascorbic Acid (VITAMIN C) 500 MG CPCR, Take 500 mg by mouth daily., Disp: , Rfl:    B Complex-C (B-COMPLEX WITH  VITAMIN C) tablet, Take 1 tablet by mouth 2 (two) times daily., Disp: , Rfl:    BD PEN NEEDLE NANO U/F 32G X 4 MM MISC, USE 4 TIMES DAILY (HUMALOG 3 TIMES DAILY AND LANTUS ONCE DAILY) OFFICE NOTIFIED 08/06/17, Disp: 90 each, Rfl: 1   BLACK ELDERBERRY,BERRY-FLOWER, PO, Take 15 mLs by mouth daily. Liquid, Disp: , Rfl:    Calcium Carbonate (CALCIUM 600 PO), Take 600 mg by mouth daily., Disp: , Rfl:    carvedilol (COREG) 3.125 MG tablet, Take 1 tablet (3.125 mg total) by mouth 2 (two) times daily., Disp: 180 tablet, Rfl: 3   cholecalciferol (VITAMIN D) 1000 UNITS tablet, Take 1,000 Units by mouth daily. Shaklee, Disp: , Rfl:    clobetasol ointment (TEMOVATE) 3.47 %, APPLY 1 APPLICATION TOPICALLY 2 TIMES DAILY AS NEEDED (BUG BITES)., Disp: 30 g, Rfl: 0   Coenzyme Q10 (COQ10) 200 MG CAPS, Take 200 mg by mouth daily., Disp: , Rfl:    Continuous Blood Gluc Sensor (B[place]) MISC, by Does not apply route., Disp: , Rfl:    doxycycline (VIBRA-TABS) 100 MG tablet, Take 1 tablet (100 mg total) by mouth 2 (two) times daily., Disp: 60 tablet, Rfl: 0   Elastic Bandages & Supports (  MEDICAL COMPRESSION STOCKINGS) MISC, 1 each by Does not apply route daily., Disp: 2 each, Rfl: 5   ELIQUIS 5 MG TABS tablet, TAKE 1 TABLET BY MOUTH TWICE A DAY, Disp: 180 tablet, Rfl: 1   estradiol (ESTRACE VAGINAL) 0.1 MG/GM vaginal cream, 1/2 gm once weekly using applicator, apply blueberry sized amount of cream using tip of finger to urethra twice weekly, Disp: 30 g, Rfl: 3   Exenatide ER (BYDUREON BCISE) 2 MG/0.85ML AUIJ, Inject 2 mg into the skin every Sunday., Disp: , Rfl:    furosemide (LASIX) 40 MG tablet, Take 1 tablet (40 mg total) by mouth 2 (two) times daily., Disp: 60 tablet, Rfl: 5   GARLIC 6295 PO, Take 2,841 mg by mouth daily., Disp: , Rfl:    Glucosamine 500 MG TABS, Take 500 mg by mouth daily., Disp: , Rfl:    HUMALOG KWIKPEN 100 UNIT/ML KiwkPen, Inject 4-12 Units into the skin 3 (three) times daily  before meals. If needed sliding scale, Disp: , Rfl: 3   insulin glargine (LANTUS) 100 unit/mL SOPN, Inject 22 Units into the skin at bedtime., Disp: , Rfl:    Krill Oil 1000 MG CAPS, Take 1,000 mg by mouth daily., Disp: , Rfl:    Mag Aspart-Potassium Aspart (POTASSIUM & MAGNESIUM ASPARTAT PO), Take 1 tablet by mouth daily., Disp: , Rfl:    Multiple Vitamins-Minerals (PRESERVISION AREDS 2) CAPS, Take 1 capsule by mouth 2 (two) times daily., Disp: , Rfl:    OVER THE COUNTER MEDICATION, Place 1 application into both eyes See admin instructions. Blephadex eyelid foam, apply to eyelids once daily, Disp: , Rfl:    OVER THE COUNTER MEDICATION, Take 1 tablet by mouth daily. Vita-Lea Gold otc supplement With out vitamin K (Shaklee products), Disp: , Rfl:    OVER THE COUNTER MEDICATION, Take 1 tablet by mouth See admin instructions. Nutriferon otc supplement take Take 1 tablet on Sun, Tues, Thurs, Sat. Take 1 tablet twice daily on Mon, Wed, and Fri, Disp: , Rfl:    Probiotic Product (PROBIOTIC PO), Take 1 capsule by mouth at bedtime., Disp: , Rfl:    PROLIA 60 MG/ML SOSY injection, Inject 60 mg into the skin every 6 (six) months., Disp: , Rfl:    Propylene Glycol (SYSTANE BALANCE OP), Place 1 drop into both eyes daily as needed (dry eyes)., Disp: , Rfl:    rosuvastatin (CRESTOR) 10 MG tablet, Take 1 tablet (10 mg total) by mouth daily., Disp: 90 tablet, Rfl: 1   sacubitril-valsartan (ENTRESTO) 24-26 MG, Take 1 tablet by mouth 2 (two) times daily., Disp: 60 tablet, Rfl: 3   Sodium Chloride-Sodium Bicarb (NETI POT SINUS WASH NA), Place 1 Dose into the nose at bedtime., Disp: , Rfl:    tiZANidine (ZANAFLEX) 2 MG tablet, Take 1 tablet (2 mg total) by mouth at bedtime., Disp: 90 tablet, Rfl: 1   Vitamin D, Ergocalciferol, (DRISDOL) 1.25 MG (50000 UT) CAPS capsule, Take 50,000 Units by mouth every Saturday., Disp: , Rfl:   Social History   Tobacco Use  Smoking Status Never  Smokeless Tobacco Never  Tobacco  Comments   Smoking cessation materials not required    Allergies  Allergen Reactions   Cyclobenzaprine Hypertension   Cheese Other (See Comments)    bloating   Ciprofloxacin Hcl Other (See Comments)    Muscle pain   Coconut Oil     Upset stomach   Keflex [Cephalexin] Other (See Comments)    Patient prefers not to take this medication  due to the side effects, caused tendon issues   Objective:  There were no vitals filed for this visit. There is no height or weight on file to calculate BMI. Constitutional Well developed. Well nourished.  Vascular Dorsalis pedis pulses palpable bilaterally. Posterior tibial pulses palpable bilaterally. Capillary refill normal to all digits.  No cyanosis or clubbing noted. Pedal hair growth normal.  Neurologic Normal speech. Oriented to person, place, and time. Epicritic sensation to light touch grossly present bilaterally.  Dermatologic Left third digit dehiscence superficial that is improving slowly.  Does not probe down to bone.  No purulent drainage noted no erythema noted.  No purulent drainage noted.  No other clinical signs of infection noted   Orthopedic: Normal joint ROM without pain or crepitus bilaterally. No visible deformities. No bony tenderness.   Radiographs: None Assessment:   1. Toe contracture, left   2. Wound dehiscence     Plan:  Patient was evaluated and treated and all questions answered.  Left third digit superficial wound dehiscence with a history of flexor tenotomy of the third digit~improving -All questions and concerns were discussed with the patient in extensive detail. -At this time I discussed with her that the wound is superficial in nature does not probe down to deep tissue however it can always regressed and can lead to bone infection loss of toe.  I discussed this with her in extensive detail she states understanding. -Continue doxycycline until resolve meant. -Continue Betadine wet-to-dry dressing  daily. -Continue use surgical shoe -I discussed with her that her if she develops any cellulitis or streaking I discussed with her go to the emergency room right away.  She states understanding.   No follow-ups on file.

## 2021-07-05 NOTE — Progress Notes (Signed)
07/06/2021 1:45 PM   Bonnie Rowland 1940-09-09 [id number]  Referring provider: Steele Sizer, MD [address] [place],  [place] [address]  Chief Complaint  Patient presents with   Urinary Incontinence    Urological history: 1. Urge incontinence -contributing factors of age, obesity, caffeine, diabetes, vaginal atrophy (using vaginal estrogen cream), ACE inhibitors, alpha blockers, antiarrhythmics and diuretics -PVR 0 mL -Managed with poise pads  2. Renal stone -contrast CT 2021 - 3 mm left upper renal stone  3. Renal cyst -contrast CT 2021 - 12 mm left renal inferior pole cyst  HPI: Bonnie Rowland is a 81 y.o. female who presents today for yearly visit.    PVR 0 mL  She is experiencing 8 or more daytime urinations which she contributes to her diuretic.  She is having 1-2 episodes of nocturia.  She has a moderate urge and has urge incontinence.  She has urinary leakage 1-2 times weekly and is wearing absorbent pads daily.  She is drinking 2 L of water a day.  And she is engaged in toilet mapping.  Patient denies any modifying or aggravating factors.  Patient denies any gross hematuria, dysuria or suprapubic/flank pain.  Patient denies any fevers, chills, nausea or vomiting.     PMH: Past Medical History:  Diagnosis Date   Acute cystitis    Acute on chronic combined systolic and diastolic CHF (congestive heart failure) (HCC)    Allergic rhinitis, cause unspecified    Arthritis 2015   Atherosclerosis of renal artery (HCC)    left   Atrial fibrillation (HCC)    Bronchitis, not specified as acute or chronic    Cancer ([place]) 12/2013   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; left shoulder blade   Cancer ([place]) 05/2014   basal cell removed left temple   Cataract 2003   Cellulitis and abscess of leg, except foot    Conjunctivitis unspecified    Dermatophytosis of nail    Diabetes mellitus    type II   Esophageal reflux    Hyperlipidemia    Hypertension    Osteoporosis  2016   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)    Renal artery stenosis (HCC)    Sprain of lumbar region    Thoracic or lumbosacral neuritis or radiculitis, unspecified    Urinary tract infection, site not specified     Surgical History: Past Surgical History:  Procedure Laterality Date   APPENDECTOMY     CARDIAC CATHETERIZATION     CARDIOVERSION N/A 11/30/2020   Procedure: CARDIOVERSION;  Surgeon: Minna Merritts, MD;  Location: [facility];  Service: Cardiovascular;  Laterality: N/A;   CARDIOVERSION N/A 01/20/2021   Procedure: CARDIOVERSION;  Surgeon: Minna Merritts, MD;  Location: [facility];  Service: Cardiovascular;  Laterality: N/A;   CARDIOVERSION N/A 01/30/2021   Procedure: CARDIOVERSION;  Surgeon: Wellington Hampshire, MD;  Location: [facility];  Service: Cardiovascular;  Laterality: N/A;   cataract surgery     COLONOSCOPY     COLONOSCOPY WITH PROPOFOL N/A 05/09/2016   Procedure: COLONOSCOPY WITH PROPOFOL;  Surgeon: Robert Bellow, MD;  Location: [facility];  Service: Endoscopy;  Laterality: N/A;   DIAGNOSTIC MAMMOGRAM     EYE SURGERY Bilateral    Cataract Extraction   JOINT REPLACEMENT  2016   KNEE ARTHROPLASTY Right 03/30/2015   Procedure: COMPUTER ASSISTED TOTAL KNEE ARTHROPLASTY;  Surgeon: Dereck Leep, MD;  Location: [facility];  Service: Orthopedics;  Laterality: Right;   KNEE ARTHROSCOPY Right    KNEE CLOSED REDUCTION  Right 05/23/2015   Procedure: CLOSED MANIPULATION KNEE;  Surgeon: Dereck Leep, MD;  Location: [facility];  Service: Orthopedics;  Laterality: Right;   RIGHT/LEFT HEART CATH AND CORONARY ANGIOGRAPHY N/A 01/25/2021   Procedure: RIGHT/LEFT HEART CATH AND CORONARY ANGIOGRAPHY;  Surgeon: Nelva Bush, MD;  Location: [facility];  Service: Cardiovascular;  Laterality: N/A;   TONSILLECTOMY      Home Medications:  Allergies as of 07/06/2021       Reactions   Cyclobenzaprine Hypertension   Cheese Other (See Comments)   bloating   Ciprofloxacin Hcl Other  (See Comments)   Muscle pain   Coconut Oil    Upset stomach   Keflex [cephalexin] Other (See Comments)   Patient prefers not to take this medication due to the side effects, caused tendon issues        Medication List        Accurate as of July 06, 2021  1:45 PM. If you have any questions, ask your nurse or doctor.          STOP taking these medications    carvedilol 3.125 MG tablet Commonly known as: COREG Stopped by: Shay Bartoli, [name]       TAKE these medications    acetaminophen 650 MG CR tablet Commonly known as: TYLENOL Take 1,300 mg by mouth at bedtime.   ALFALFA PO Take 1 tablet by mouth daily.   amiodarone 200 MG tablet Commonly known as: PACERONE Take 0.5 tablets (100 mg total) by mouth daily.   ascorbic Acid 500 MG Cpcr Commonly known as: VITAMIN C Take 500 mg by mouth daily.   B-complex with vitamin C tablet Take 1 tablet by mouth 2 (two) times daily.   BD Pen Needle Nano U/F 32G X 4 MM Misc Generic drug: Insulin Pen Needle USE 4 TIMES DAILY (HUMALOG 3 TIMES DAILY AND LANTUS ONCE DAILY) OFFICE NOTIFIED 08/06/17   BLACK ELDERBERRY(BERRY-FLOWER) PO Take 15 mLs by mouth daily. Liquid   Bydureon BCise 2 MG/0.85ML Auij Generic drug: Exenatide ER Inject 2 mg into the skin every Sunday.   CALCIUM 600 PO Take 600 mg by mouth daily.   cholecalciferol 1000 units tablet Commonly known as: VITAMIN D Take 1,000 Units by mouth daily. Shaklee   clobetasol ointment 0.05 % Commonly known as: TEMOVATE APPLY 1 APPLICATION TOPICALLY 2 TIMES DAILY AS NEEDED (BUG BITES).   CoQ10 200 MG Caps Take 200 mg by mouth daily.   doxycycline 100 MG tablet Commonly known as: VIBRA-TABS Take 1 tablet (100 mg total) by mouth 2 (two) times daily.   Eliquis 5 MG Tabs tablet Generic drug: apixaban TAKE 1 TABLET BY MOUTH TWICE A DAY   estradiol 0.1 MG/GM vaginal cream Commonly known as: ESTRACE VAGINAL 1/2 gm once weekly using applicator, apply blueberry  sized amount of cream using tip of finger to urethra twice weekly   [place] by Does not apply route.   furosemide 40 MG tablet Commonly known as: LASIX Take 1 tablet (40 mg total) by mouth 2 (two) times daily.   GARLIC 7412 PO Take 8,786 mg by mouth daily.   Glucosamine 500 MG Tabs Take 500 mg by mouth daily.   HumaLOG KwikPen 100 UNIT/ML KwikPen Generic drug: insulin lispro Inject 4-12 Units into the skin 3 (three) times daily before meals. If needed sliding scale   insulin glargine 100 unit/mL Sopn Commonly known as: LANTUS Inject 22 Units into the skin at bedtime.   Krill Oil  1000 MG Caps Take 1,000 mg by mouth daily.   Medical Compression Stockings Misc 1 each by Does not apply route daily.   NETI POT SINUS [place] NA Place 1 Dose into the nose at bedtime.   OVER THE COUNTER MEDICATION Place 1 application into both eyes See admin instructions. Blephadex eyelid foam, apply to eyelids once daily   OVER THE COUNTER MEDICATION Take 1 tablet by mouth daily. Vita-Lea Gold otc supplement With out vitamin K (Shaklee products)   OVER THE COUNTER MEDICATION Take 1 tablet by mouth See admin instructions. Nutriferon otc supplement take Take 1 tablet on Sun, Tues, Thurs, Sat. Take 1 tablet twice daily on Mon, Wed, and Fri   POTASSIUM & MAGNESIUM ASPARTAT PO Take 1 tablet by mouth daily.   PreserVision AREDS 2 Caps Take 1 capsule by mouth 2 (two) times daily.   PROBIOTIC PO Take 1 capsule by mouth at bedtime.   Prolia 60 MG/ML Sosy injection Generic drug: denosumab Inject 60 mg into the skin every 6 (six) months.   rosuvastatin 10 MG tablet Commonly known as: CRESTOR Take 1 tablet (10 mg total) by mouth daily.   sacubitril-valsartan 24-26 MG Commonly known as: ENTRESTO Take 1 tablet by mouth 2 (two) times daily.   SYSTANE BALANCE OP Place 1 drop into both eyes daily as needed (dry eyes).   tiZANidine 2 MG tablet Commonly known as:  ZANAFLEX Take 1 tablet (2 mg total) by mouth at bedtime.   Vitamin D (Ergocalciferol) 1.25 MG (50000 UNIT) Caps capsule Commonly known as: DRISDOL Take 50,000 Units by mouth every Saturday.        Allergies:  Allergies  Allergen Reactions   Cyclobenzaprine Hypertension   Cheese Other (See Comments)    bloating   Ciprofloxacin Hcl Other (See Comments)    Muscle pain   Coconut Oil     Upset stomach   Keflex [Cephalexin] Other (See Comments)    Patient prefers not to take this medication due to the side effects, caused tendon issues    Family History: Family History  Problem Relation Age of Onset   Diabetes Mother    Hypertension Mother    Cancer Mother    Kidney disease Mother    Hypertension Father    Cancer Father        Prostate   Breast cancer Maternal Aunt 61   Colon cancer Son    Kidney cancer Neg Hx    Bladder Cancer Neg Hx     Social History:  reports that she has never smoked. She has never used smokeless tobacco. She reports that she does not currently use alcohol after a past usage of about 1.0 standard drink per week. She reports that she does not use drugs.  ROS: For pertinent review of systems please refer to history of present illness  Physical Exam: Ht 5' 1"  (1.549 m)    Wt 177 lb (80.3 kg)    HC 9" (22.9 cm)    BMI 33.44 kg/m   Constitutional:  Well nourished. Alert and oriented, No acute distress. HEENT: [place] AT, mask in place.  Trachea midline Cardiovascular: No clubbing, cyanosis, or edema. Respiratory: Normal respiratory effort, no increased work of breathing. Neurologic: Grossly intact, no focal deficits, moving all 4 extremities. Psychiatric: Normal mood and affect.    Laboratory Data: Component     Latest Ref Rng & Units 04/12/2021  Glucose     70 - 99 mg/dL 132 (H)  BUN  8 - 27 mg/dL 14  Creatinine     0.57 - 1.00 mg/dL 0.80  eGFR     >59 mL/min/1.73 75  BUN/Creatinine Ratio     12 - 28 18  Sodium     134 - 144 mmol/L 139   Potassium     3.5 - 5.2 mmol/L 4.5  Chloride     96 - 106 mmol/L 98  CO2     20 - 29 mmol/L 23  Calcium     8.7 - 10.3 mg/dL 9.7  Total Protein     6.0 - 8.5 g/dL 6.9  Albumin     3.7 - 4.7 g/dL 4.3  Globulin, Total     1.5 - 4.5 g/dL 2.6  Albumin/Globulin Ratio     1.2 - 2.2 1.7  Total Bilirubin     0.0 - 1.2 mg/dL 0.7  Alkaline Phosphatase     44 - 121 IU/L 78  AST     0 - 40 IU/L 31  ALT     0 - 32 IU/L 20   Hemoglobin A1C 4.2 - 5.6 % 7.1 High    Average Blood Glucose (Calc) mg/dL 157   Resulting Agency  [facility]  Narrative Performed by [facility] Normal Range:    4.2 - 5.6%  Increased Risk:  5.7 - 6.4%  Diabetes:        >= 6.5%  Glycemic Control for adults with diabetes:  <7%   Specimen Collected: 03/23/21 10:00 Last Resulted: 03/23/21 10:12  Received From: [facility]  Result Received: 03/24/21 16:13   Component     Latest Ref Rng & Units 01/31/2021  WBC     4.0 - 10.5 K/uL 8.4  RBC     3.87 - 5.11 MIL/uL 4.34  Hemoglobin     12.0 - 15.0 g/dL 13.9  HCT     36.0 - 46.0 % 41.1  MCV     80.0 - 100.0 fL 94.7  MCH     26.0 - 34.0 pg 32.0  MCHC     30.0 - 36.0 g/dL 33.8  RDW     11.5 - 15.5 % 12.6  Platelets     150 - 400 K/uL 285  MPV     7.5 - 12.5 fL   NEUT#     1,500 - 7,800 cells/uL   Lymphocyte #     850 - 3,900 cells/uL   WBC mixed population     200 - 950 cells/uL   Eosinophils Absolute     15 - 500 cells/uL   Basophils Absolute     0 - 200 cells/uL   Neutrophils     %   Total Lymphocyte     %   Monocytes Relative     %   Eosinophil     %   Basophil     %   nRBC     0.0 - 0.2 % 0.0  WBC, UA     0 - 5 /hpf   Epithelial Cells (non renal)     0 - 10 /hpf   Bacteria, UA     None seen/Few   I have reviewed the labs.     Pertinent Imaging Component     Latest Ref Rng & Units 07/06/2021  [facility] Result      0   Assessment and plan:  1.  Urge incontinence -Patient is  managing her incontinence conservatively  with POISE pads  2 Tinea cruris  -resolved  3. Vaginal Atrophy -continue to apply the vaginal estrogen cream 3 nights weekly -refill given  Return in about 1 year (around 07/06/2022) for PVR and OAB questionnaire.  These notes generated with voice recognition software. I apologize for typographical errors.  Zara Council, [name]  [facility] [address] [place], [place] [address] [phone]

## 2021-07-06 ENCOUNTER — Ambulatory Visit (INDEPENDENT_AMBULATORY_CARE_PROVIDER_SITE_OTHER): Payer: Medicare Other | Admitting: Urology

## 2021-07-06 ENCOUNTER — Encounter: Payer: Self-pay | Admitting: Urology

## 2021-07-06 ENCOUNTER — Telehealth: Payer: Self-pay | Admitting: Family

## 2021-07-06 ENCOUNTER — Ambulatory Visit: Payer: Medicare Other | Admitting: Podiatry

## 2021-07-06 ENCOUNTER — Other Ambulatory Visit: Payer: Self-pay

## 2021-07-06 VITALS — Ht 61.0 in | Wt 177.0 lb

## 2021-07-06 DIAGNOSIS — M9903 Segmental and somatic dysfunction of lumbar region: Secondary | ICD-10-CM | POA: Diagnosis not present

## 2021-07-06 DIAGNOSIS — N952 Postmenopausal atrophic vaginitis: Secondary | ICD-10-CM | POA: Diagnosis not present

## 2021-07-06 DIAGNOSIS — M9901 Segmental and somatic dysfunction of cervical region: Secondary | ICD-10-CM | POA: Diagnosis not present

## 2021-07-06 DIAGNOSIS — M5416 Radiculopathy, lumbar region: Secondary | ICD-10-CM | POA: Diagnosis not present

## 2021-07-06 DIAGNOSIS — N3941 Urge incontinence: Secondary | ICD-10-CM | POA: Diagnosis not present

## 2021-07-06 DIAGNOSIS — M5033 Other cervical disc degeneration, cervicothoracic region: Secondary | ICD-10-CM | POA: Diagnosis not present

## 2021-07-06 LAB — BLADDER SCAN AMB NON-IMAGING: Scan Result: 0

## 2021-07-06 MED ORDER — ESTRADIOL 0.1 MG/GM VA CREA: TOPICAL_CREAM | VAGINAL | 3 refills | Status: DC

## 2021-07-06 NOTE — Telephone Encounter (Signed)
Notified patient that she was not approved for novart[employer] patient assistance for entresto as her household income exceeds the limit.   Shawnese Magner, NT

## 2021-07-11 NOTE — Progress Notes (Signed)
Name: Bonnie Rowland   MRN: [id number]    DOB: [birth year]   Date:07/12/2021       Progress Note  Subjective  Chief Complaint  Follow up  HPI  Osteoporosis: she was on Evista for years but bone density got worse she was on Reclast but bone density did not improve, she is currently getting Prolia given by Dr. Honor Junes . She denies side effects of medication . She is due for repeat bone density but gets it at [facility]   DM: seeing Endocrinologist - Dr. Manfred Shirts  up to date with eye exam. She had A1C done at [facility] Sep 2022 and it was 7.1 %    She is on Bydureon, pre-meal insulin and basal insulin. She has a [employer]. Denies polyphagia, polydipsia or polyuria Urine micro is up to date due for repeat in March . She goes to eye doctor every 6 months, she also sees a podiatrist    HTN: BP at home is usually is well controlled . She denies chest pain, palpitation or SOB    Atherosclerosis of aorta: on statin therapy Last LDL was 38, continue Crestor, we will recheck labs today    Senile purpura: both arms and hands. She takes Eliquis. Reassurance given    Obesity . She has DM, HTN, dyslipidemia, atherosclerosis of aorta. She is more active since she moved to [place], going to [employer], she also walks on a regular basis She has lost 18 lbs since July   Atrial Flibrilation/CHF:  she is under the care of Dr. Rockey Situ, last cardioversion worked but still on Eliquis , now on carvedilol  amiodarone. She was admitted July 2022 with respiratory failure and pulmonary edema, she has been compliant with medications. She states she has mild lower extremity edema that is stable, she has orthopnea and sleeps on her recliner  Diabetic foot infection: seeing podiatrist, having a slow healing spot on left foot from recent surgery - had flexor tenotomy and is taking doxy and has regular follow ups, advised Tdap and will send rx to pharmacy   Back pain: she has been taking  Tizanidine every night a total of 4 mg and would like a refill of medication   Patient Active Problem List   Diagnosis Date Noted   Persistent atrial fibrillation ([place])    Hypertensive urgency    Acute on chronic combined systolic and diastolic CHF (congestive heart failure) (HCC)    Elevated troponin    AF (paroxysmal atrial fibrillation) ([place]) 01/23/2021   Type 2 diabetes mellitus with hyperlipidemia ([place]) 01/23/2021   Hypertensive emergency 01/23/2021   Acute respiratory failure with hypoxia ([place]) 01/23/2021   Clavi 04/07/2020   Acute left-sided low back pain without sciatica 11/25/2019   Trochanteric bursitis of left hip 11/25/2019   Plantar fasciitis, bilateral 05/21/2019   Age-related osteoporosis without current pathological fracture 11/05/2017   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([place]) 07/09/2017   Atherosclerosis of abdominal aorta ([place]) 07/09/2017   Bilateral carotid bruits 08/24/2015   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   Atrial flutter ([place]) 02/22/2015   Chronic pain 01/10/2015   Type 2 diabetes, uncontrolled, 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 01/10/2015   Fatigue 01/10/2015   History of melanoma excision 01/10/2015   Morbid obesity ([place]) 01/10/2015   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   Spinal stenosis, lumbar region, 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   DDD (degenerative disc disease), lumbar 11/14/2014   Greater trochanteric bursitis  of both hips 11/14/2014   Piriformis syndrome 11/14/2014   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   Hyperlipemia 11/07/2009   Hypertension, benign 11/07/2009   Atherosclerosis of renal artery ([place]) 11/07/2009   Hyperlipidemia due to type 2 diabetes mellitus ([place]) 11/07/2009   Perennial allergic rhinitis 11/27/2007   Lumbosacral neuritis 01/17/2007    Past Surgical History:  Procedure Laterality Date   APPENDECTOMY     CARDIAC CATHETERIZATION     CARDIOVERSION N/A 11/30/2020   Procedure:  CARDIOVERSION;  Surgeon: Minna Merritts, MD;  Location: [facility] ORS;  Service: Cardiovascular;  Laterality: N/A;   CARDIOVERSION N/A 01/20/2021   Procedure: CARDIOVERSION;  Surgeon: Minna Merritts, MD;  Location: [facility] OR[facility];  Service: Cardiovascular;  Laterality: N/A;   CARDIOVERSION N/A 01/30/2021   Procedure: CARDIOVERSION;  Surgeon: Wellington Hampshire, MD;  Location: [facility] OR[facility];  Service: Cardiovascular;  Laterality: N/A;   cataract surgery     COLONOSCOPY     COLONOSCOPY WITH PROPOFOL N/A 05/09/2016   Procedure: COLONOSCOPY WITH PROPOFOL;  Surgeon: Robert Bellow, MD;  Location: [facility] END[facility];  Service: Endoscopy;  Laterality: N/A;   DIAGNOSTIC MAMMOGRAM     EYE SURGERY Bilateral    Cataract Extraction   JOINT REPLACEMENT  2016   KNEE ARTHROPLASTY Right 03/30/2015   Procedure: COMPUTER ASSISTED TOTAL KNEE ARTHROPLASTY;  Surgeon: Dereck Leep, MD;  Location: [facility] OR[facility];  Service: Orthopedics;  Laterality: Right;   KNEE ARTHROSCOPY Right    KNEE CLOSED REDUCTION Right 05/23/2015   Procedure: CLOSED MANIPULATION KNEE;  Surgeon: Dereck Leep, MD;  Location: [facility];  Service: Orthopedics;  Laterality: Right;   RIGHT/LEFT HEART CATH AND CORONARY ANGIOGRAPHY N/A 01/25/2021   Procedure: RIGHT/LEFT HEART CATH AND CORONARY ANGIOGRAPHY;  Surgeon: Nelva Bush, MD;  Location: [facility];  Service: Cardiovascular;  Laterality: N/A;   TONSILLECTOMY      Family History  Problem Relation Age of Onset   Diabetes Mother    Hypertension Mother    Cancer Mother    Kidney disease Mother    Hypertension Father    Cancer Father        Prostate   Breast cancer Maternal Aunt 22   Colon cancer Son    Kidney cancer Neg Hx    Bladder Cancer Neg Hx     Social History   Tobacco Use   Smoking status: Never   Smokeless tobacco: Never   Tobacco comments:    Smoking cessation materials not required  Substance Use Topics   Alcohol use: Not Currently    Alcohol/week: 1.0 standard drink     Types: 1 Glasses of wine per week     Current Outpatient Medications:    acetaminophen (TYLENOL) 650 MG CR tablet, Take 1,300 mg by mouth at bedtime., Disp: , Rfl:    ALFALFA PO, Take 1 tablet by mouth daily., Disp: , Rfl:    amiodarone (PACERONE) 200 MG tablet, Take 0.5 tablets (100 mg total) by mouth daily., Disp: 90 tablet, Rfl: 3   ascorbic Acid (VITAMIN C) 500 MG CPCR, Take 500 mg by mouth daily., Disp: , Rfl:    B Complex-C (B-COMPLEX WITH VITAMIN C) tablet, Take 1 tablet by mouth 2 (two) times daily., Disp: , Rfl:    BD PEN NEEDLE NANO U/F 32G X 4 MM MISC, USE 4 TIMES DAILY (HUMALOG 3 TIMES DAILY AND LANTUS ONCE DAILY) OFFICE NOTIFIED 08/06/17, Disp: 90 each, Rfl: 1   BLACK ELDERBERRY,BERRY-FLOWER, PO, Take 15 mLs by mouth daily.  Liquid, Disp: , Rfl:    Calcium Carbonate (CALCIUM 600 PO), Take 600 mg by mouth daily., Disp: , Rfl:    cholecalciferol (VITAMIN D) 1000 UNITS tablet, Take 1,000 Units by mouth daily. [place]aklee, Disp: , Rfl:    clobetasol ointment (TEMOVATE) 3.81 %, APPLY 1 APPLICATION TOPICALLY 2 TIMES DAILY AS NEEDED (BUG BITES)., Disp: 30 g, Rfl: 0   Coenzyme Q10 (COQ10) 200 MG CAPS, Take 200 mg by mouth daily., Disp: , Rfl:    Continuous Blood Gluc Sensor ([place]) MISC, by Does not apply route., Disp: , Rfl:    doxycycline (VIBRA-TABS) 100 MG tablet, Take 1 tablet (100 mg total) by mouth 2 (two) times daily., Disp: 60 tablet, Rfl: 0   Elastic Bandages & Supports ([place]) MISC, 1 each by Does not apply route daily., Disp: 2 each, Rfl: 5   ELIQUIS 5 MG TABS tablet, TAKE 1 TABLET BY MOUTH TWICE A DAY, Disp: 180 tablet, Rfl: 1   estradiol (ESTRACE VAGINAL) 0.1 MG/GM vaginal cream, 1/2 gm once weekly using applicator, apply blueberry sized amount of cream using tip of finger to urethra twice weekly, Disp: 30 g, Rfl: 3   Exenatide ER (BYDUREON BCISE) 2 MG/0.85ML AUIJ, Inject 2 mg into the skin every Sunday., Disp: , Rfl:    furosemide  (LASIX) 40 MG tablet, Take 1 tablet (40 mg total) by mouth 2 (two) times daily., Disp: 60 tablet, Rfl: 5   GARLIC 8403 PO, Take 7,543 mg by mouth daily., Disp: , Rfl:    Glucosamine 500 MG TABS, Take 500 mg by mouth daily., Disp: , Rfl:    HUMALOG KWIKPEN 100 UNIT/ML KiwkPen, Inject 4-12 Units into the skin 3 (three) times daily before meals. If needed sliding scale, Disp: , Rfl: 3   insulin glargine (LANTUS) 100 unit/mL SOPN, Inject 22 Units into the skin at bedtime., Disp: , Rfl:    Krill Oil 1000 MG CAPS, Take 1,000 mg by mouth daily., Disp: , Rfl:    losartan (COZAAR) 25 MG tablet, Take 25 mg by mouth daily., Disp: , Rfl:    Mag Aspart-Potassium Aspart (POTASSIUM & MAGNESIUM ASPARTAT PO), Take 1 tablet by mouth daily., Disp: , Rfl:    Multiple Vitamins-Minerals (PRESERVISION AREDS 2) CAPS, Take 1 capsule by mouth 2 (two) times daily., Disp: , Rfl:    OVER THE COUNTER MEDICATION, Place 1 application into both eyes See admin instructions. Blephadex eyelid foam, apply to eyelids once daily, Disp: , Rfl:    OVER THE COUNTER MEDICATION, Take 1 tablet by mouth daily. Vita-Lea Gold otc supplement With out vitamin K (Shaklee products), Disp: , Rfl:    OVER THE COUNTER MEDICATION, Take 1 tablet by mouth See admin instructions. Nutriferon otc supplement take Take 1 tablet on Sun, Tues, Thurs, Sat. Take 1 tablet twice daily on Mon, Wed, and Fri, Disp: , Rfl:    Probiotic Product (PROBIOTIC PO), Take 1 capsule by mouth at bedtime., Disp: , Rfl:    PROLIA 60 MG/ML SOSY injection, Inject 60 mg into the skin every 6 (six) months., Disp: , Rfl:    Propylene Glycol (SYSTANE BALANCE OP), Place 1 drop into both eyes daily as needed (dry eyes)., Disp: , Rfl:    rosuvastatin (CRESTOR) 10 MG tablet, Take 1 tablet (10 mg total) by mouth daily., Disp: 90 tablet, Rfl: 1   sacubitril-valsartan (ENTRESTO) 24-26 MG, Take 1 tablet by mouth 2 (two) times daily., Disp: 60 tablet, Rfl: 3   Sodium Chloride-Sodium  Bicarb (NETI  POT SINUS [place] NA), Place 1 Dose into the nose at bedtime., Disp: , Rfl:    tiZANidine (ZANAFLEX) 2 MG tablet, Take 1 tablet (2 mg total) by mouth at bedtime., Disp: 90 tablet, Rfl: 1   Vitamin D, Ergocalciferol, (DRISDOL) 1.25 MG (50000 UT) CAPS capsule, Take 50,000 Units by mouth every Saturday., Disp: , Rfl:   Allergies  Allergen Reactions   Cyclobenzaprine Hypertension   Cheese Other (See Comments)    bloating   Ciprofloxacin Hcl Other (See Comments)    Muscle pain   Coconut Oil     Upset stomach   Keflex [Cephalexin] Other (See Comments)    Patient prefers not to take this medication due to the side effects, caused tendon issues    I personally reviewed active problem list, medication list, allergies, family history, social history, health maintenance with the patient/caregiver today.   ROS  Constitutional: Negative for fever , positive for weight change.  Respiratory: Negative for cough, she has mild  shortness of breath.   Cardiovascular: Negative for chest pain or palpitations.  Gastrointestinal: Negative for abdominal pain, no bowel changes.  Musculoskeletal: Negative for gait problem or joint swelling.  Skin: Negative for rash.  Neurological: Negative for dizziness or headache.  No other specific complaints in a complete review of systems (except as listed in HPI above).   Objective  Vitals:   07/12/21 0757  BP: 136/74  Pulse: 82  Resp: 16  Temp: 98.1 F (36.7 C)  SpO2: 99%  Weight: 178 lb (80.7 kg)  Height: 5' 1"  (1.549 m)    Body mass index is 33.63 kg/m.  Physical Exam  Constitutional: Patient appears well-developed and well-nourished. Obese  No distress.  HEENT: head atraumatic, normocephalic, pupils equal and reactive to light, neck supple Cardiovascular: Normal rate, regular rhythm and normal heart sounds.  No murmur heard.  BLE edema - 1-2 plus on left and trace to 1 on right - likely from foot infection . Pulmonary/Chest: Effort normal and breath  sounds normal. No respiratory distress. Abdominal: Soft.  There is no tenderness. Psychiatric: Patient has a normal mood and affect. behavior is normal. Judgment and thought content normal.   Recent Results (from the past 2160 hour(s))  T4, free     Status: None   Collection Time: 04/25/21 10:32 AM  Result Value Ref Range   Free T4 1.53 0.82 - 1.77 ng/dL  Bladder Scan (Post Void Residual) in office     Status: None   Collection Time: 07/06/21  1:08 PM  Result Value Ref Range   Scan Result 0     PHQ2/9: Depression screen [facility] 2/9 07/12/2021 05/09/2021 04/20/2021 03/22/2021 02/09/2021  Decreased Interest 0 0 0 0 0  Down, Depressed, Hopeless 0 0 0 0 0  PHQ - 2 Score 0 0 0 0 0  Altered sleeping 0 - - - -  Tired, decreased energy 0 - - - -  Change in appetite 0 - - - -  Feeling bad or failure about yourself  0 - - - -  Trouble concentrating 0 - - - -  Moving slowly or fidgety/restless 0 - - - -  Suicidal thoughts 0 - - - -  PHQ-9 Score 0 - - - -  Difficult doing work/chores - - - - -  Some recent data might be hidden    phq 9 is negative   Fall Risk: Fall Risk  07/12/2021 05/09/2021 04/20/2021 03/22/2021 02/24/2021  Falls in the past  year? 0 0 0 0 0  Comment - - - - -  Number falls in past yr: 0 0 0 0 0  Injury with Fall? 0 0 0 0 0  Risk for fall due to : No Fall Risks Impaired mobility No Fall Risks - -  Risk for fall due to: Comment - - - - -  Follow up Falls prevention discussed Falls evaluation completed;Falls prevention discussed Falls prevention discussed Falls evaluation completed;Falls prevention discussed Falls evaluation completed      Functional Status Survey: Is the patient deaf or have difficulty hearing?: Yes Does the patient have difficulty seeing, even when wearing glasses/contacts?: No Does the patient have difficulty concentrating, remembering, or making decisions?: No Does the patient have difficulty walking or climbing stairs?: Yes Does the patient have  difficulty dressing or bathing?: No Does the patient have difficulty doing errands alone such as visiting a doctor's office or shopping?: No    Assessment & Plan  1. Type 2 diabetes mellitus with other specified complication, with long-term current use of insulin ([place])  - HM Diabetes Foot Exam  2. Chronic combined systolic and diastolic congestive heart failure ([place])   3. Hypertension associated with type 2 diabetes mellitus ([place])   4. Atherosclerosis of abdominal aorta (HCC)  - Lipid panel  5. 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([place])   6. Dyslipidemia associated with type 2 diabetes mellitus ([place])  - Lipid panel  7. AF (paroxysmal atrial fibrillation) ([place])   8. Muscle spasm of back  - tiZANidine (ZANAFLEX) 4 MG tablet; Take 1 tablet (4 mg total) by mouth at bedtime.  Dispense: 90 tablet; Refill: 1  9. Osteoporosis, post-menopausal   10. Need for Tdap vaccination  - Tdap (ADACEL) 10-31-13.5 LF-MCG/0.5 injection; Inject 0.5 mLs into the muscle once for 1 dose.  Dispense: 0.5 mL; Refill: 0  11. 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  Dispense: 0.5 mL; Refill: 0  12. Long-term use of high-risk medication  - COMPLETE METABOLIC PANEL WITH GFR - CBC with Differential/Platelet - TSH

## 2021-07-12 ENCOUNTER — Ambulatory Visit (INDEPENDENT_AMBULATORY_CARE_PROVIDER_SITE_OTHER): Payer: Medicare Other | Admitting: Family Medicine

## 2021-07-12 ENCOUNTER — Encounter: Payer: Self-pay | Admitting: Family Medicine

## 2021-07-12 VITALS — BP 136/74 | HR 82 | Temp 98.1°F | Resp 16 | Ht 61.0 in | Wt 178.0 lb

## 2021-07-12 DIAGNOSIS — I700 Atherosclerosis of aorta: Secondary | ICD-10-CM

## 2021-07-12 DIAGNOSIS — E11628 Type 2 diabetes mellitus with other skin complications: Secondary | ICD-10-CM

## 2021-07-12 DIAGNOSIS — E1169 Type 2 diabetes mellitus with other specified complication: Secondary | ICD-10-CM

## 2021-07-12 DIAGNOSIS — E785 Hyperlipidemia, unspecified: Secondary | ICD-10-CM

## 2021-07-12 DIAGNOSIS — L089 Local infection of the skin and subcutaneous tissue, unspecified: Secondary | ICD-10-CM

## 2021-07-12 DIAGNOSIS — D692 Other nonthrombocytopenic purpura: Secondary | ICD-10-CM | POA: Diagnosis not present

## 2021-07-12 DIAGNOSIS — I480 Paroxysmal atrial fibrillation: Secondary | ICD-10-CM | POA: Diagnosis not present

## 2021-07-12 DIAGNOSIS — Z23 Encounter for immunization: Secondary | ICD-10-CM | POA: Diagnosis not present

## 2021-07-12 DIAGNOSIS — I152 Hypertension secondary to endocrine disorders: Secondary | ICD-10-CM

## 2021-07-12 DIAGNOSIS — M62830 Muscle spasm of back: Secondary | ICD-10-CM | POA: Diagnosis not present

## 2021-07-12 DIAGNOSIS — Z79899 Other long term (current) drug therapy: Secondary | ICD-10-CM | POA: Diagnosis not present

## 2021-07-12 DIAGNOSIS — I5042 Chronic combined systolic (congestive) and diastolic (congestive) heart failure: Secondary | ICD-10-CM | POA: Diagnosis not present

## 2021-07-12 DIAGNOSIS — E1159 Type 2 diabetes mellitus with other circulatory complications: Secondary | ICD-10-CM

## 2021-07-12 DIAGNOSIS — M810 Age-related osteoporosis without current pathological fracture: Secondary | ICD-10-CM

## 2021-07-12 DIAGNOSIS — Z794 Long term (current) use of insulin: Secondary | ICD-10-CM

## 2021-07-12 MED ORDER — TIZANIDINE HCL 4 MG PO TABS: 4.0000 mg | ORAL_TABLET | Freq: Every day | ORAL | 1 refills | Status: DC

## 2021-07-12 MED ORDER — TETANUS-DIPHTH-ACELL PERTUSSIS 5-2-15.5 LF-MCG/0.5 IM SUSP: 0.5000 mL | Freq: Once | INTRAMUSCULAR | 0 refills | Status: AC

## 2021-07-13 ENCOUNTER — Encounter: Payer: Self-pay | Admitting: Podiatry

## 2021-07-13 ENCOUNTER — Ambulatory Visit (INDEPENDENT_AMBULATORY_CARE_PROVIDER_SITE_OTHER): Payer: Medicare Other | Admitting: Podiatry

## 2021-07-13 ENCOUNTER — Other Ambulatory Visit: Payer: Self-pay

## 2021-07-13 VITALS — Temp 97.8°F

## 2021-07-13 DIAGNOSIS — T8130XA Disruption of wound, unspecified, initial encounter: Secondary | ICD-10-CM

## 2021-07-13 DIAGNOSIS — M205X2 Other deformities of toe(s) (acquired), left foot: Secondary | ICD-10-CM

## 2021-07-13 LAB — COMPLETE METABOLIC PANEL WITH GFR
AG Ratio: 1.8 (calc) (ref 1.0–2.5)
ALT: 23 U/L (ref 6–29)
AST: 35 U/L (ref 10–35)
Albumin: 4.1 g/dL (ref 3.6–5.1)
Alkaline phosphatase (APISO): 56 U/L (ref 37–153)
BUN: 14 mg/dL (ref 7–25)
CO2: 33 mmol/L — ABNORMAL HIGH (ref 20–32)
Calcium: 9.3 mg/dL (ref 8.6–10.4)
Chloride: 102 mmol/L (ref 98–110)
Creat: 0.85 mg/dL (ref 0.60–1.00)
Globulin: 2.3 g/dL (calc) (ref 1.9–3.7)
Glucose, Bld: 90 mg/dL (ref 65–99)
Potassium: 4.2 mmol/L (ref 3.5–5.3)
Sodium: 141 mmol/L (ref 135–146)
Total Bilirubin: 0.8 mg/dL (ref 0.2–1.2)
Total Protein: 6.4 g/dL (ref 6.1–8.1)
eGFR: 70 mL/min/{1.73_m2} (ref >=60)

## 2021-07-13 LAB — CBC WITH DIFFERENTIAL/PLATELET
Absolute Monocytes: 573 cells/uL (ref 200–950)
Basophils Absolute: 29 cells/uL (ref 0–200)
Basophils Relative: 0.6 %
Eosinophils Absolute: 88 cells/uL (ref 15–500)
Eosinophils Relative: 1.8 %
HCT: 39.3 % (ref 35.0–45.0)
Hemoglobin: 13 g/dL (ref 11.7–15.5)
Lymphs Abs: 1112 cells/uL (ref 850–3900)
MCH: 32.7 pg (ref 27.0–33.0)
MCHC: 33.1 g/dL (ref 32.0–36.0)
MCV: 99 fL (ref 80.0–100.0)
MPV: 10.8 fL (ref 7.5–12.5)
Monocytes Relative: 11.7 %
Neutro Abs: 3097 cells/uL (ref 1500–7800)
Neutrophils Relative %: 63.2 %
Platelets: 181 10*3/uL (ref 140–400)
RBC: 3.97 10*6/uL (ref 3.80–5.10)
RDW: 12 % (ref 11.0–15.0)
Total Lymphocyte: 22.7 %
WBC: 4.9 10*3/uL (ref 3.8–10.8)

## 2021-07-13 LAB — LIPID PANEL
Cholesterol: 127 mg/dL (ref <200)
HDL: 70 mg/dL (ref >=50)
LDL Cholesterol (Calc): 43 mg/dL (calc)
Non-HDL Cholesterol (Calc): 57 mg/dL (calc) (ref <130)
Total CHOL/HDL Ratio: 1.8 (calc) (ref <5.0)
Triglycerides: 59 mg/dL (ref <150)

## 2021-07-13 LAB — TSH: TSH: 8.69 mIU/L — ABNORMAL HIGH (ref 0.40–4.50)

## 2021-07-13 NOTE — Progress Notes (Signed)
Subjective:  Patient ID: Bonnie Rowland, female    DOB: [birth year],  MRN: [id number]  Chief Complaint  Patient presents with   Wound Check    "I think it's doing well.  I hope it's closed."    81 y.o. female presents with the above complaint.  Patient presents with follow-up to left third digit Lexer tenotomy.  She states she is doing good.  She states is getting a little bit better her pain is improving.  She has been doing Betadine wet-to-dry dressing changes.  She denies any other acute complaints.   Review of Systems: Negative except as noted in the HPI. Denies N/V/F/Ch.  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; left shoulder blade   Cancer ([place]) 05/2014   basal cell removed left temple   Cataract 2003   Cellulitis and abscess of leg, except foot    Conjunctivitis unspecified    Dermatophytosis of nail    Diabetes mellitus    type II   Esophageal reflux    Hyperlipidemia    Hypertension    Osteoporosis 2016   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)    Renal artery stenosis (HCC)    Sprain of lumbar region    Thoracic or lumbosacral neuritis or radiculitis, unspecified    Urinary tract infection, site not specified     Current Outpatient Medications:    acetaminophen (TYLENOL) 650 MG CR tablet, Take 1,300 mg by mouth at bedtime., Disp: , Rfl:    ALFALFA PO, Take 1 tablet by mouth daily., Disp: , Rfl:    amiodarone (PACERONE) 200 MG tablet, Take 0.5 tablets (100 mg total) by mouth daily., Disp: 90 tablet, Rfl: 3   ascorbic Acid (VITAMIN C) 500 MG CPCR, Take 500 mg by mouth daily., Disp: , Rfl:    B Complex-C (B-COMPLEX WITH VITAMIN C) tablet,  Take 1 tablet by mouth 2 (two) times daily., Disp: , Rfl:    BD PEN NEEDLE NANO U/F 32G X 4 MM MISC, USE 4 TIMES DAILY (HUMALOG 3 TIMES DAILY AND LANTUS ONCE DAILY) OFFICE NOTIFIED 08/06/17, Disp: 90 each, Rfl: 1   BLACK ELDERBERRY,BERRY-FLOWER, PO, Take 15 mLs by mouth daily. Liquid, Disp: , Rfl:    Calcium Carbonate (CALCIUM 600 PO), Take 600 mg by mouth daily., Disp: , Rfl:    cholecalciferol (VITAMIN D) 1000 UNITS tablet, Take 1,000 Units by mouth daily. [place], Disp: , Rfl:    clobetasol ointment (TEMOVATE) 3.71 %, APPLY 1 APPLICATION TOPICALLY 2 TIMES DAILY AS NEEDED (BUG BITES)., Disp: 30 g, Rfl: 0   Coenzyme Q10 (COQ10) 200 MG CAPS, Take 200 mg by mouth daily., Disp: , Rfl:    Continuous Blood Gluc Sensor ([place]) MISC, by Does not apply route., Disp: , Rfl:    doxycycline (VIBRA-TABS) 100 MG tablet, Take 1 tablet (100 mg total) by mouth 2 (two) times daily., Disp: 60 tablet, Rfl: 0   Elastic Bandages & Supports ([place]) MISC, 1 each by Does not apply route daily., Disp: 2 each, Rfl: 5   ELIQUIS 5 MG TABS tablet, TAKE 1 TABLET BY MOUTH TWICE  A DAY, Disp: 180 tablet, Rfl: 1   estradiol (ESTRACE VAGINAL) 0.1 MG/GM vaginal cream, 1/2 gm once weekly using applicator, apply blueberry sized amount of cream using tip of finger to urethra twice weekly, Disp: 30 g, Rfl: 3   Exenatide ER (BYDUREON BCISE) 2 MG/0.85ML AUIJ, Inject 2 mg into the skin every Sunday., Disp: , Rfl:    furosemide (LASIX) 40 MG tablet, Take 1 tablet (40 mg total) by mouth 2 (two) times daily., Disp: 60 tablet, Rfl: 5   GARLIC 7915 PO, Take 0,569 mg by mouth daily., Disp: , Rfl:    Glucosamine 500 MG TABS, Take 500 mg by mouth daily., Disp: , Rfl:    HUMALOG KWIKPEN 100 UNIT/ML KiwkPen, Inject 4-12 Units into the skin 3 (three) times daily before meals. If needed sliding scale, Disp: , Rfl: 3   insulin glargine (LANTUS) 100 unit/mL SOPN, Inject 22 Units into the skin at bedtime.,  Disp: , Rfl:    Krill Oil 1000 MG CAPS, Take 1,000 mg by mouth daily., Disp: , Rfl:    losartan (COZAAR) 25 MG tablet, Take 25 mg by mouth daily., Disp: , Rfl:    Mag Aspart-Potassium Aspart (POTASSIUM & MAGNESIUM ASPARTAT PO), Take 1 tablet by mouth daily., Disp: , Rfl:    Multiple Vitamins-Minerals (PRESERVISION AREDS 2) CAPS, Take 1 capsule by mouth 2 (two) times daily., Disp: , Rfl:    OVER THE COUNTER MEDICATION, Place 1 application into both eyes See admin instructions. Blephadex eyelid foam, apply to eyelids once daily, Disp: , Rfl:    OVER THE COUNTER MEDICATION, Take 1 tablet by mouth daily. Vita-Lea Gold otc supplement With out vitamin K (Shaklee products), Disp: , Rfl:    OVER THE COUNTER MEDICATION, Take 1 tablet by mouth See admin instructions. Nutriferon otc supplement take Take 1 tablet on Sun, Tues, Thurs, Sat. Take 1 tablet twice daily on Mon, Wed, and Fri, Disp: , Rfl:    Probiotic Product (PROBIOTIC PO), Take 1 capsule by mouth at bedtime., Disp: , Rfl:    PROLIA 60 MG/ML SOSY injection, Inject 60 mg into the skin every 6 (six) months., Disp: , Rfl:    Propylene Glycol (SYSTANE BALANCE OP), Place 1 drop into both eyes daily as needed (dry eyes)., Disp: , Rfl:    rosuvastatin (CRESTOR) 10 MG tablet, Take 1 tablet (10 mg total) by mouth daily., Disp: 90 tablet, Rfl: 1   sacubitril-valsartan (ENTRESTO) 24-26 MG, Take 1 tablet by mouth 2 (two) times daily., Disp: 60 tablet, Rfl: 3   Sodium Chloride-Sodium Bicarb (NETI POT SINUS [place] NA), Place 1 Dose into the nose at bedtime., Disp: , Rfl:    tiZANidine (ZANAFLEX) 4 MG tablet, Take 1 tablet (4 mg total) by mouth at bedtime., Disp: 90 tablet, Rfl: 1   Vitamin D, Ergocalciferol, (DRISDOL) 1.25 MG (50000 UT) CAPS capsule, Take 50,000 Units by mouth every Saturday., Disp: , Rfl:   Social History   Tobacco Use  Smoking Status Never  Smokeless Tobacco Never  Tobacco Comments   Smoking cessation materials not required    Allergies   Allergen Reactions   Cyclobenzaprine Hypertension   Cheese Other (See Comments)    bloating   Ciprofloxacin Hcl Other (See Comments)    Muscle pain   Coconut Oil     Upset stomach   Keflex [Cephalexin] Other (See Comments)    Patient prefers not to take this medication due to the side effects, caused tendon issues   Objective:  Vitals:   07/13/21 1517  Temp: 97.8 F (36.6 C)   There is no height or weight on file to calculate BMI. Constitutional Well developed. Well nourished.  Vascular Dorsalis pedis pulses palpable bilaterally. Posterior tibial pulses palpable bilaterally. Capillary refill normal to all digits.  No cyanosis or clubbing noted. Pedal hair growth normal.  Neurologic Normal speech. Oriented to person, place, and time. Epicritic sensation to light touch grossly present bilaterally.  Dermatologic Left third digit dehiscence superficial wound healed.  Completely epithelialized.  Good correction alignment noted of the toe.   Orthopedic: Normal joint ROM without pain or crepitus bilaterally. No visible deformities. No bony tenderness.   Radiographs: None Assessment:   1. Toe contracture, left   2. Wound dehiscence      Plan:  Patient was evaluated and treated and all questions answered.  Left third digit superficial wound dehiscence with a history of flexor tenotomy of the third digit~improving -All questions and concerns were discussed with the patient in extensive detail. -Clinically healed.  No further concern for infection or wound noted.  Wound has completely epithelialized. -If any foot and ankle issues arise future of asked her to come see me.   No follow-ups on file.

## 2021-07-17 NOTE — Progress Notes (Addendum)
Cardiology Office Note  Date:  07/18/2021   ID:  Bonnie Rowland, Bonnie Rowland 06-Sep-1940, MRN [id number]  PCP:  Steele Sizer, MD   Chief Complaint  Patient presents with   3 month follow up     "Doing well." Medications reviewed by the patient verbally.     HPI:  Bonnie Rowland is a very pleasant 81 y.o. woman with a history of chest pain,  normal coronary arteries by cardiac catheterization April 2007 ,  < 50%  renal artery disease,  check 2021 Carotid 2017 <1-39 b/l hyperlipidemia,  diabetes,  hypertension,  obesity  F/u for atrial flutter, atrial fib  In follow-up today reports she is doing very well Does not check blood pressure at home Last clinic with primary care blood pressure 130, elevated today was rushing Has started some regular walking Weight down 20 pounds since last clinic visit Denies tachypalpitations concerning for atrial fibrillation/flutter  Discussed recent lab work, trend up and her TSH now 8 6 months ago was 5 No recent hospitalizations  EKG personally reviewed by myself on todays visit Normal sinus rhythm rate 60 bpm no significant ST-T wave changes  Other past medical history reviewed -Prior renal artery ultrasound in 08/2019 showed < 50% left RAS Catheterization with nonobstructive disease  Eliquis in place of aspirin 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   -Status post recent briefly successful DCCV in 11/2020 with repeat briefly successful DCCV on amiodarone 01/20/2021 Admitted to the hospital July 24, normal sinus rhythm at that time, back into atrial fibrillation July 25 , 2022 -CHA2DS2-VASc at least 6 (CHF, HTN, age x2, DM2, sex category) on Eliquis 5 twice daily,  In the hospital July 2022 Flash pulmonary edema in the setting of hypertension, acute on chronic diastolic   Echocardiogram October 20, 2020 normal ejection fraction normal right heart pressures mild MR  Stress: grand daughter with MVA  Lab work reviewed A1C 6.5 Total chol 130  CT  ABD/pelvis: Mild aortic atherosclerosis  02/21/15 she presented to [facility] for right total knee replacement by Dr. Marry Guan. She reported developing tachycardia that morning. She had extreme pain, was rushing to get to the hospital. EKG showed atrial flutter with ventricular rate 140 bpm. She was sent to the emergency room, 3 hours later was back in normal sinus rhythm. No medications were given to restore normal sinus rhythm.   echocardiogram in May 2006 which was essentially normal with ejection fraction 60%.   Stress test in 06, 03 and 01 had been normal.  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  PSH:    Past Surgical History:  Procedure Laterality Date   APPENDECTOMY     CARDIAC CATHETERIZATION     CARDIOVERSION N/A 11/30/2020   Procedure: CARDIOVERSION;  Surgeon: Minna Merritts, MD;  Location: [facility] OR[facility];  Service: Cardiovascular;  Laterality: N/A;   CARDIOVERSION N/A 01/20/2021   Procedure: CARDIOVERSION;  Surgeon: Minna Merritts, MD;  Location: [facility];  Service: Cardiovascular;  Laterality: N/A;  CARDIOVERSION N/A 01/30/2021   Procedure: CARDIOVERSION;  Surgeon: Wellington Hampshire, MD;  Location: [facility];  Service: Cardiovascular;  Laterality: N/A;   cataract surgery     COLONOSCOPY      COLONOSCOPY WITH PROPOFOL N/A 05/09/2016   Procedure: COLONOSCOPY WITH PROPOFOL;  Surgeon: Robert Bellow, MD;  Location: [facility];  Service: Endoscopy;  Laterality: N/A;   DIAGNOSTIC MAMMOGRAM     EYE SURGERY Bilateral    Cataract Extraction   JOINT REPLACEMENT  2016   KNEE ARTHROPLASTY Right 03/30/2015   Procedure: COMPUTER ASSISTED TOTAL KNEE ARTHROPLASTY;  Surgeon: Dereck Leep, MD;  Location: [facility];  Service: Orthopedics;  Laterality: Right;   KNEE ARTHROSCOPY Right    KNEE CLOSED REDUCTION Right 05/23/2015   Procedure: CLOSED MANIPULATION KNEE;  Surgeon: Dereck Leep, MD;  Location: [facility];  Service: Orthopedics;  Laterality: Right;   RIGHT/LEFT HEART CATH AND CORONARY ANGIOGRAPHY N/A 01/25/2021   Procedure: RIGHT/LEFT HEART CATH AND CORONARY ANGIOGRAPHY;  Surgeon: Nelva Bush, MD;  Location: [place] [facility];  Service: Cardiovascular;  Laterality: N/A;   TONSILLECTOMY      Current Outpatient Medications  Medication Sig Dispense Refill   acetaminophen (TYLENOL) 650 MG CR tablet Take 1,300 mg by mouth at bedtime.     ALFALFA PO Take 1 tablet by mouth daily.     amiodarone (PACERONE) 200 MG tablet Take 0.5 tablets (100 mg total) by mouth daily. 90 tablet 3   ascorbic Acid (VITAMIN C) 500 MG CPCR Take 500 mg by mouth daily.     B Complex-C (B-COMPLEX WITH VITAMIN C) tablet Take 1 tablet by mouth 2 (two) times daily.     BD PEN NEEDLE NANO U/F 32G X 4 MM MISC USE 4 TIMES DAILY (HUMALOG 3 TIMES DAILY AND LANTUS ONCE DAILY) OFFICE NOTIFIED 08/06/17 90 each 1   BLACK ELDERBERRY,BERRY-FLOWER, PO Take 15 mLs by mouth daily. Liquid     Calcium Carbonate (CALCIUM 600 PO) Take 600 mg by mouth daily.     cholecalciferol (VITAMIN D) 1000 UNITS tablet Take 1,000 Units by mouth daily. Shaklee     clobetasol ointment (TEMOVATE) 4.62 % APPLY 1 APPLICATION TOPICALLY 2 TIMES DAILY AS NEEDED (BUG BITES). 30 g 0   Coenzyme Q10 (COQ10) 200 MG CAPS Take 200 mg by mouth daily.      Continuous Blood Gluc Sensor ([place]) MISC by Does not apply route.     Elastic Bandages & Supports (MEDICAL COMPRESSION STOCKINGS) MISC 1 each by Does not apply route daily. 2 each 5   ELIQUIS 5 MG TABS tablet TAKE 1 TABLET BY MOUTH TWICE A DAY 180 tablet 1   estradiol (ESTRACE VAGINAL) 0.1 MG/GM vaginal cream 1/2 gm once weekly using applicator, apply blueberry sized amount of cream using tip of finger to urethra twice weekly 30 g 3   Exenatide ER (BYDUREON BCISE) 2 MG/0.85ML AUIJ Inject 2 mg into the skin every Sunday.     furosemide (LASIX) 40 MG tablet Take 1 tablet (40 mg total) by mouth 2 (two) times daily. 60 tablet 5   GARLIC 7035 PO Take 0,093 mg by mouth daily.     Glucosamine 500 MG TABS Take 500 mg by mouth daily.     HUMALOG KWIKPEN 100 UNIT/ML KiwkPen Inject 4-12 Units into the skin 3 (three) times daily before meals. If needed sliding scale  3   insulin glargine (LANTUS) 100 unit/mL SOPN Inject 22 Units into the skin at bedtime.  Krill Oil 1000 MG CAPS Take 1,000 mg by mouth daily.     losartan (COZAAR) 25 MG tablet Take 25 mg by mouth daily.     Mag Aspart-Potassium Aspart (POTASSIUM & MAGNESIUM ASPARTAT PO) Take 1 tablet by mouth daily.     Multiple Vitamins-Minerals (PRESERVISION AREDS 2) CAPS Take 1 capsule by mouth 2 (two) times daily.     OVER THE COUNTER MEDICATION Place 1 application into both eyes See admin instructions. Blephadex eyelid foam, apply to eyelids once daily     OVER THE COUNTER MEDICATION Take 1 tablet by mouth daily. Vita-Lea Gold otc supplement With out vitamin K (Shaklee products)     OVER THE COUNTER MEDICATION Take 1 tablet by mouth See admin instructions. Nutriferon otc supplement take Take 1 tablet on Sun, Tues, Thurs, Sat. Take 1 tablet twice daily on Mon, Wed, and Fri     Probiotic Product (PROBIOTIC PO) Take 1 capsule by mouth at bedtime.     PROLIA 60 MG/ML SOSY injection Inject 60 mg into the skin every 6 (six) months.      Propylene Glycol (SYSTANE BALANCE OP) Place 1 drop into both eyes daily as needed (dry eyes).     rosuvastatin (CRESTOR) 10 MG tablet Take 1 tablet (10 mg total) by mouth daily. 90 tablet 1   sacubitril-valsartan (ENTRESTO) 24-26 MG Take 1 tablet by mouth 2 (two) times daily. 60 tablet 3   Sodium Chloride-Sodium Bicarb (NETI POT SINUS WASH NA) Place 1 Dose into the nose at bedtime.     tiZANidine (ZANAFLEX) 4 MG tablet Take 1 tablet (4 mg total) by mouth at bedtime. 90 tablet 1   Vitamin D, Ergocalciferol, (DRISDOL) 1.25 MG (50000 UT) CAPS capsule Take 50,000 Units by mouth every Saturday.     doxycycline (VIBRA-TABS) 100 MG tablet Take 1 tablet (100 mg total) by mouth 2 (two) times daily. (Patient not taking: Reported on 07/18/2021) 60 tablet 0   No current facility-administered medications for this visit.     Allergies:   Cyclobenzaprine, Cheese, Ciprofloxacin hcl, Coconut oil, and Keflex [cephalexin]   Social History:  The patient  reports that she has never smoked. She has never used smokeless tobacco. She reports that she does not currently use alcohol after a past usage of about 1.0 standard drink per week. She reports that she does not use drugs.   Family History:   family history includes Breast cancer (age of onset: 24) in her maternal aunt; Cancer in her father and mother; Colon cancer in her son; Diabetes in her mother; Hypertension in her father and mother; Kidney disease in her mother.    Review of Systems: Review of Systems  Constitutional: Negative.   Eyes: Negative.   Respiratory:  Positive for shortness of breath.   Cardiovascular: Negative.   Gastrointestinal: Negative.   Genitourinary: Negative.   Musculoskeletal:  Positive for joint pain.  Neurological: Negative.   Psychiatric/Behavioral: Negative.    All other systems reviewed and are negative.  PHYSICAL EXAM: VS:  BP (!) 160/70 (BP Location: Left Arm, Patient Position: Sitting, Cuff Size: Normal)    Pulse 60     Ht 5\' 3"  (1.6 m)    Wt 177 lb 2 oz (80.3 kg)    SpO2 97%    BMI 31.38 kg/m  , BMI Body mass index is 31.38 kg/m. Constitutional:  oriented to person, place, and time. No distress.  HENT:  Head: Grossly normal Eyes:  no discharge. No scleral icterus.  Neck:  No JVD, no carotid bruits  Cardiovascular: Regular rate and rhythm, no murmurs appreciated Pulmonary/Chest: Clear to auscultation bilaterally, no wheezes or rails Abdominal: Soft.  no distension.  no tenderness.  Musculoskeletal: Normal range of motion Neurological:  normal muscle tone. Coordination normal. No atrophy Skin: Skin warm and dry Psychiatric: normal affect, pleasant  Recent Labs: 01/22/2021: B Natriuretic Peptide 237.3 01/31/2021: Magnesium 2.0 07/12/2021: ALT 23; BUN 14; Creat 0.85; Hemoglobin 13.0; Platelets 181; Potassium 4.2; Sodium 141; TSH 8.69    Lipid Panel Lab Results  Component Value Date   CHOL 127 07/12/2021   HDL 70 07/12/2021   LDLCALC 43 07/12/2021   TRIG 59 07/12/2021      Wt Readings from Last 3 Encounters:  07/18/21 177 lb 2 oz (80.3 kg)  07/12/21 178 lb (80.7 kg)  07/06/21 177 lb (80.3 kg)     ASSESSMENT AND PLAN:  Persistent atrial fibrillation amiodarone 100 mg daily, Coreg 3.125 twice daily Eliquis 5 twice daily Maintaining normal sinus rhythm On last visit to our office October 2022 was on carvedilol, confirmed with her, she is still taking this TSH and T4 ordered 3 months  Hyperlipidemia, unspecified hyperlipidemia type  Cholesterol is at goal on the current lipid regimen. No changes to the medications were made.  Improved numbers with 20 pound weight loss  Hypertension, benign  Blood pressure elevated, was rushing, recommended she monitor blood pressure at home  Atrial flutter, unspecified type 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 07/18/2021  [place], [place]

## 2021-07-18 ENCOUNTER — Encounter: Payer: Self-pay | Admitting: Cardiovascular Disease

## 2021-07-18 ENCOUNTER — Ambulatory Visit (INDEPENDENT_AMBULATORY_CARE_PROVIDER_SITE_OTHER): Payer: Medicare Other | Admitting: Cardiovascular Disease

## 2021-07-18 ENCOUNTER — Other Ambulatory Visit: Payer: Self-pay

## 2021-07-18 VITALS — BP 160/70 | HR 60 | Ht 63.0 in | Wt 177.1 lb

## 2021-07-18 DIAGNOSIS — I4892 Unspecified atrial flutter: Secondary | ICD-10-CM

## 2021-07-18 DIAGNOSIS — I480 Paroxysmal atrial fibrillation: Secondary | ICD-10-CM

## 2021-07-18 DIAGNOSIS — I428 Other cardiomyopathies: Secondary | ICD-10-CM

## 2021-07-18 DIAGNOSIS — I5032 Chronic diastolic (congestive) heart failure: Secondary | ICD-10-CM | POA: Diagnosis not present

## 2021-07-18 DIAGNOSIS — I272 Pulmonary hypertension, unspecified: Secondary | ICD-10-CM

## 2021-07-18 DIAGNOSIS — I10 Essential (primary) hypertension: Secondary | ICD-10-CM | POA: Diagnosis not present

## 2021-07-18 DIAGNOSIS — E785 Hyperlipidemia, unspecified: Secondary | ICD-10-CM

## 2021-07-18 DIAGNOSIS — Z79899 Other long term (current) drug therapy: Secondary | ICD-10-CM | POA: Diagnosis not present

## 2021-07-18 DIAGNOSIS — I2510 Atherosclerotic heart disease of native coronary artery without angina pectoris: Secondary | ICD-10-CM | POA: Diagnosis not present

## 2021-07-18 DIAGNOSIS — I700 Atherosclerosis of aorta: Secondary | ICD-10-CM | POA: Diagnosis not present

## 2021-07-18 DIAGNOSIS — E1169 Type 2 diabetes mellitus with other specified complication: Secondary | ICD-10-CM

## 2021-07-18 NOTE — Patient Instructions (Signed)
Medication Instructions:  No changes  If you need a refill on your cardiac medications before your next appointment, please call your pharmacy.   Lab work: TSH and T4 in three months (end of April)  Testing/Procedures: No new testing needed  Follow-Up: At [facility], you and your health needs are our priority.  As part of our continuing mission to provide you with exceptional heart care, we have created designated Provider Care Teams.  These Care Teams include your primary Cardiologist (physician) and Advanced Practice Providers (APPs -  Physician Assistants and Nurse Practitioners) who all work together to provide you with the care you need, when you need it.  You will need a follow up appointment in 12 months  Providers on your designated Care Team:   Murray Hodgkins, NP [name], [name] Cadence Kathlen Mody, [place]  COVID-19 Vaccine Information can be found at: ShippingScam.[email or web address].uk For questions related to vaccine distribution or appointments, please email [email or web address] or call [phone].

## 2021-07-20 DIAGNOSIS — M9903 Segmental and somatic dysfunction of lumbar region: Secondary | ICD-10-CM | POA: Diagnosis not present

## 2021-07-20 DIAGNOSIS — M9901 Segmental and somatic dysfunction of cervical region: Secondary | ICD-10-CM | POA: Diagnosis not present

## 2021-07-20 DIAGNOSIS — M5416 Radiculopathy, lumbar region: Secondary | ICD-10-CM | POA: Diagnosis not present

## 2021-07-20 DIAGNOSIS — M5033 Other cervical disc degeneration, cervicothoracic region: Secondary | ICD-10-CM | POA: Diagnosis not present

## 2021-07-25 DIAGNOSIS — H3554 Dystrophies primarily involving the retinal pigment epithelium: Secondary | ICD-10-CM | POA: Diagnosis not present

## 2021-07-26 ENCOUNTER — Other Ambulatory Visit: Payer: Self-pay | Admitting: Family Medicine

## 2021-07-26 DIAGNOSIS — M62830 Muscle spasm of back: Secondary | ICD-10-CM

## 2021-07-26 NOTE — Telephone Encounter (Signed)
Requested medication (s) are due for refill today: no  Requested medication (s) are on the active medication list: not at this dose  Future visit scheduled: 01/09/2022  Notes to clinic:   Dose inconsistent with current med list, please assess. This medication can not be delegated, please assess.    Requested Prescriptions  Pending Prescriptions Disp Refills   tiZANidine (ZANAFLEX) 2 MG tablet [Pharmacy Med Name: TIZANIDINE HCL 2 MG TABLET] 90 tablet 1    Sig: TAKE 1 TABLET BY MOUTH AT BEDTIME.     Not Delegated - Cardiovascular:  Alpha-2 Agonists - tizanidine Failed - 07/26/2021  1:38 AM      Failed - This refill cannot be delegated      Passed - Valid encounter within last 6 months    Recent Outpatient Visits           2 weeks ago Type 2 diabetes mellitus with other specified complication, with long-term current use of insulin [facility])   [facility] Steele Sizer, MD   5 months ago Hospital discharge follow-up   [facility] Steele Sizer, MD   6 months ago 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[facility])   [facility] Steele Sizer, MD   1 year ago Diabetes mellitus type 2 in obese [facility])   [facility] Steele Sizer, MD   1 year ago [facility] Steele Sizer, MD       Future Appointments             In 5 months Ancil Boozer, Drue Stager, MD [facility], [place]   In 9 months  [facility], [place]   In 11 months [place], Gordan Payment [facility]

## 2021-08-06 ENCOUNTER — Other Ambulatory Visit: Payer: Self-pay | Admitting: Family

## 2021-08-08 DIAGNOSIS — U071 COVID-19: Secondary | ICD-10-CM | POA: Diagnosis not present

## 2021-08-17 DIAGNOSIS — M5416 Radiculopathy, lumbar region: Secondary | ICD-10-CM | POA: Diagnosis not present

## 2021-08-17 DIAGNOSIS — M9903 Segmental and somatic dysfunction of lumbar region: Secondary | ICD-10-CM | POA: Diagnosis not present

## 2021-08-17 DIAGNOSIS — M5033 Other cervical disc degeneration, cervicothoracic region: Secondary | ICD-10-CM | POA: Diagnosis not present

## 2021-08-17 DIAGNOSIS — M9901 Segmental and somatic dysfunction of cervical region: Secondary | ICD-10-CM | POA: Diagnosis not present

## 2021-08-22 ENCOUNTER — Telehealth: Payer: Self-pay

## 2021-08-22 NOTE — Progress Notes (Signed)
Per Clinical pharmacist, please reschedule patient telephone appointment on 09/06/2021.  I left a voice message informing patient I reschedule her telephone appointment to 10/18/2021 at 11:45 pm , and my contact information  if this time/day does not work she can return my call to reschedule.  [place] Pharmacist Assistant [phone]

## 2021-08-26 ENCOUNTER — Other Ambulatory Visit: Payer: Self-pay | Admitting: Cardiovascular Disease

## 2021-08-28 NOTE — Telephone Encounter (Signed)
Refill request

## 2021-08-28 NOTE — Telephone Encounter (Signed)
Eliquis 5 mg refill request received. Patient is 81  years old, weight- 80.3 kg, Crea- 0.85 on 07/1121, Diagnosis- atrial flutter, and last seen by Dr. Rockey Situ on 07/18/21. Dose is appropriate based on dosing criteria. Will send in refill to requested pharmacy.

## 2021-08-29 ENCOUNTER — Ambulatory Visit: Payer: Medicare Other | Attending: Family | Admitting: Family

## 2021-08-29 ENCOUNTER — Encounter: Payer: Self-pay | Admitting: Family

## 2021-08-29 ENCOUNTER — Other Ambulatory Visit: Payer: Self-pay

## 2021-08-29 VITALS — BP 155/60 | HR 60 | Resp 16 | Ht 63.0 in | Wt 178.6 lb

## 2021-08-29 DIAGNOSIS — I2510 Atherosclerotic heart disease of native coronary artery without angina pectoris: Secondary | ICD-10-CM | POA: Insufficient documentation

## 2021-08-29 DIAGNOSIS — E1136 Type 2 diabetes mellitus with diabetic cataract: Secondary | ICD-10-CM | POA: Diagnosis not present

## 2021-08-29 DIAGNOSIS — E785 Hyperlipidemia, unspecified: Secondary | ICD-10-CM | POA: Diagnosis not present

## 2021-08-29 DIAGNOSIS — E1169 Type 2 diabetes mellitus with other specified complication: Secondary | ICD-10-CM

## 2021-08-29 DIAGNOSIS — I480 Paroxysmal atrial fibrillation: Secondary | ICD-10-CM | POA: Insufficient documentation

## 2021-08-29 DIAGNOSIS — I10 Essential (primary) hypertension: Secondary | ICD-10-CM

## 2021-08-29 DIAGNOSIS — Z79899 Other long term (current) drug therapy: Secondary | ICD-10-CM | POA: Diagnosis not present

## 2021-08-29 DIAGNOSIS — M25551 Pain in right hip: Secondary | ICD-10-CM | POA: Insufficient documentation

## 2021-08-29 DIAGNOSIS — I110 Hypertensive heart disease with heart failure: Secondary | ICD-10-CM | POA: Diagnosis not present

## 2021-08-29 DIAGNOSIS — K219 Gastro-esophageal reflux disease without esophagitis: Secondary | ICD-10-CM | POA: Insufficient documentation

## 2021-08-29 DIAGNOSIS — I5032 Chronic diastolic (congestive) heart failure: Secondary | ICD-10-CM | POA: Insufficient documentation

## 2021-08-29 NOTE — Patient Instructions (Signed)
Start taking 2 entresto in the morning, and 2 at night  Return in 1 month.   Continue to weigh daily and continue walking!    Great job!

## 2021-08-29 NOTE — Progress Notes (Signed)
Patient ID: Bonnie Rowland, female    DOB: 1941/03/03, 81 y.o.   MRN: [id number]    Ms Yeoman is a 81 y/o female with a history of HTN, DM, hyperlipidemia, atrial fibrillation, GERD and chronic heart failure.   Echo report from 03/31/21 reviewed and showed an EF of  60-65% without structural changes. Echo report from 01/23/21 reviewed and showed an EF of 45-50% with mild LVH, severe hypokinesis of left ventricle and trivial MR.   RHC/LHC done 01/25/21 and showed: Mild to moderate, non-obstructive coronary artery disease, including 30% mid LAD disease, 50-60% mid/distal LCx stenosis, and 60% proximal/mid RCA lesion. Mildly to moderately reduced left ventricular systolic function with mid anterior hypo/akinesis; query atypical Takotsubo variant.  LVEF ~45%. Mildly elevated left heart filling pressures. Moderately elevated right heart filling and pulmonary artery pressures with significant pulmonary vascular resistance. Mildly reduced Fick cardiac output/index.  Has not been admitted or been in the ED in the last 6 months.   She presents today for a follow-up visit with a chief complaint of pedal edema. She describes this as chronic in nature having been present for several years. She does wear compression socks daily which do help with the edema. She denies any difficulty sleeping, dizziness, abdominal distention, palpitations, chest pain, shortness of breath, cough, fatigue or weight gain.   Walking daily for ~ 1/2 hour daily. She is thinking about joining a local gym. Overall, she says that she feels "great".   Weighing daily and reports her weight does not fluctuate more than 3 lbs.   Past Medical History:  Diagnosis Date   Acute cystitis    Acute on chronic combined systolic and diastolic CHF (congestive heart failure) (HCC)    Allergic rhinitis, cause unspecified    Arthritis 2015   Atherosclerosis of renal artery (HCC)    left   Atrial fibrillation (HCC)    Bronchitis, not  specified as acute or chronic    Cancer ([place]) 12/2013   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; left shoulder blade   Cancer ([place]) 05/2014   basal cell removed left temple   Cataract 2003   Cellulitis and abscess of leg, except foot    Conjunctivitis unspecified    Dermatophytosis of nail    Diabetes mellitus    type II   Esophageal reflux    Hyperlipidemia    Hypertension    Osteoporosis 2016   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)    Renal artery stenosis (HCC)    Sprain of lumbar region    Thoracic or lumbosacral neuritis or radiculitis, unspecified    Urinary tract infection, site not specified    Past Surgical History:  Procedure Laterality Date   APPENDECTOMY     CARDIAC CATHETERIZATION     CARDIOVERSION N/A 11/30/2020   Procedure: CARDIOVERSION;  Surgeon: Minna Merritts, MD;  Location: [facility];  Service: Cardiovascular;  Laterality: N/A;   CARDIOVERSION N/A 01/20/2021   Procedure: CARDIOVERSION;  Surgeon: Minna Merritts, MD;  Location: [facility];  Service: Cardiovascular;  Laterality: N/A;   CARDIOVERSION N/A 01/30/2021   Procedure: CARDIOVERSION;  Surgeon: Wellington Hampshire, MD;  Location: [facility];  Service: Cardiovascular;  Laterality: N/A;   cataract surgery     COLONOSCOPY     COLONOSCOPY WITH PROPOFOL N/A 05/09/2016   Procedure: COLONOSCOPY WITH PROPOFOL;  Surgeon: Robert Bellow, MD;  Location: [facility];  Service: Endoscopy;  Laterality: N/A;   DIAGNOSTIC MAMMOGRAM     EYE SURGERY Bilateral    Cataract Extraction  JOINT REPLACEMENT  2016   KNEE ARTHROPLASTY Right 03/30/2015   Procedure: COMPUTER ASSISTED TOTAL KNEE ARTHROPLASTY;  Surgeon: Dereck Leep, MD;  Location: [facility];  Service: Orthopedics;  Laterality: Right;   KNEE ARTHROSCOPY Right    KNEE CLOSED REDUCTION Right 05/23/2015   Procedure: CLOSED MANIPULATION KNEE;  Surgeon: Dereck Leep, MD;  Location: [facility];  Service: Orthopedics;  Laterality: Right;   RIGHT/LEFT HEART CATH AND CORONARY ANGIOGRAPHY N/A 01/25/2021    Procedure: RIGHT/LEFT HEART CATH AND CORONARY ANGIOGRAPHY;  Surgeon: Nelva Bush, MD;  Location: [facility];  Service: Cardiovascular;  Laterality: N/A;   TONSILLECTOMY     Family History  Problem Relation Age of Onset   Diabetes Mother    Hypertension Mother    Cancer Mother    Kidney disease Mother    Hypertension Father    Cancer Father        Prostate   Breast cancer Maternal Aunt 33   Colon cancer Son    Kidney cancer Neg Hx    Bladder Cancer Neg Hx    Social History   Tobacco Use   Smoking status: Never   Smokeless tobacco: Never   Tobacco comments:    Smoking cessation materials not required  Substance Use Topics   Alcohol use: Not Currently    Alcohol/week: 1.0 standard drink    Types: 1 Glasses of wine per week   Allergies  Allergen Reactions   Cyclobenzaprine Hypertension   Cheese Other (See Comments)    bloating   Ciprofloxacin Hcl Other (See Comments)    Muscle pain   Coconut Oil     Upset stomach   Keflex [Cephalexin] Other (See Comments)    Patient prefers not to take this medication due to the side effects, caused tendon issues   Prior to Admission medications   Medication Sig Start Date End Date Taking? Authorizing Provider  acetaminophen (TYLENOL) 650 MG CR tablet Take 1,300 mg by mouth at bedtime.   Yes [provider]  ALFALFA PO Take 1 tablet by mouth daily.   Yes [provider]  amiodarone (PACERONE) 200 MG tablet Take 0.5 tablets (100 mg total) by mouth daily. 04/12/21  Yes Dunn, Areta Haber, [name]  ascorbic Acid (VITAMIN C) 500 MG CPCR Take 500 mg by mouth daily.   Yes [provider]  B Complex-C (B-COMPLEX WITH VITAMIN C) tablet Take 1 tablet by mouth 2 (two) times daily.   Yes [provider]  BD PEN NEEDLE NANO U/F 32G X 4 MM MISC USE 4 TIMES DAILY (HUMALOG 3 TIMES DAILY AND LANTUS ONCE DAILY) OFFICE NOTIFIED 08/06/17 12/07/18  Yes Sowles, Drue Stager, MD  BLACK ELDERBERRY,BERRY-FLOWER, PO Take 15 mLs  by mouth daily. Liquid   Yes [provider]  Calcium Carbonate (CALCIUM 600 PO) Take 600 mg by mouth daily.   Yes [provider]  carvedilol (COREG) 3.125 MG tablet Take 1 tablet (3.125 mg total) by mouth 2 (two) times daily. 02/10/21 05/11/21 Yes Dunn, Areta Haber, [name]  cholecalciferol (VITAMIN D) 1000 UNITS tablet Take 1,000 Units by mouth daily. Shaklee   Yes [provider]  clobetasol ointment (TEMOVATE) 8.75 % APPLY 1 APPLICATION TOPICALLY 2 TIMES DAILY AS NEEDED (BUG BITES). 05/03/21  Yes Sowles, Drue Stager, MD  Coenzyme Q10 (COQ10) 200 MG CAPS Take 200 mg by mouth daily.   Yes [provider]  Continuous Blood Gluc Sensor ([place]) MISC by Does not apply route.   Yes  [provider]  Elastic Bandages & Supports (MEDICAL COMPRESSION STOCKINGS) [place] 1 each by Does not apply route daily. 01/08/19  Yes Sowles, Drue Stager, MD  ELIQUIS 5 MG TABS tablet TAKE 1 TABLET BY MOUTH TWICE A DAY 02/28/21  Yes Gollan, Kathlene November, MD  estradiol (ESTRACE VAGINAL) 0.1 MG/GM vaginal cream 1/2 gm once weekly using applicator, apply blueberry sized amount of cream using tip of finger to urethra twice weekly 06/10/20  Yes McGowan, Larene Beach A, [name]  Exenatide ER (BYDUREON BCISE) 2 MG/0.85ML AUIJ Inject 2 mg into the skin every Sunday. 02/26/20  Yes [provider]  furosemide (LASIX) 40 MG tablet Take 1 tablet (40 mg total) by mouth 2 (two) times daily. 02/24/21  Yes Hackney, Tina A, FNP  GARLIC 7829 PO Take 5,621 mg by mouth daily.   Yes [provider]  Glucosamine 500 MG TABS Take 500 mg by mouth daily.   Yes [provider]  HUMALOG KWIKPEN 100 UNIT/ML KiwkPen Inject 4-12 Units into the skin 3 (three) times daily before meals. If needed sliding scale 10/15/17  Yes [provider]  insulin glargine (LANTUS) 100 unit/mL SOPN Inject 22 Units into the skin at bedtime.   Yes [provider]  Javier Docker Oil 1000 MG CAPS Take 1,000  mg by mouth daily.   Yes [provider]  losartan (COZAAR) 25 MG tablet Take 25 mg by mouth daily.   Yes [provider]  Mag Aspart-Potassium Aspart (POTASSIUM & MAGNESIUM ASPARTAT PO) Take 1 tablet by mouth daily.   Yes [provider]  Multiple Vitamins-Minerals (PRESERVISION AREDS 2) CAPS Take 1 capsule by mouth 2 (two) times daily.   Yes [provider]  OVER THE COUNTER MEDICATION Place 1 application into both eyes See admin instructions. Blephadex eyelid foam, apply to eyelids once daily   Yes [provider]  OVER THE COUNTER MEDICATION Take 1 tablet by mouth daily. Vita-Lea Gold otc supplement With out vitamin K (Shaklee products)   Yes [provider]  OVER THE COUNTER MEDICATION Take 1 tablet by mouth See admin instructions. Nutriferon otc supplement take Take 1 tablet on Sun, Tues, Thurs, Sat. Take 1 tablet twice daily on Mon, Wed, and Fri   Yes [provider]  Probiotic Product (PROBIOTIC PO) Take 1 capsule by mouth at bedtime.   Yes [provider]  PROLIA 60 MG/ML SOSY injection Inject 60 mg into the skin every 6 (six) months. 09/02/19  Yes [provider]  Propylene Glycol (SYSTANE BALANCE OP) Place 1 drop into both eyes daily as needed (dry eyes).   Yes [provider]  rosuvastatin (CRESTOR) 10 MG tablet TAKE 1 TABLET (10 MG TOTAL) BY MOUTH 4 (FOUR) TIMES A WEEK. 02/21/21  Yes Sowles, Drue Stager, MD  sacubitril-valsartan (ENTRESTO) 24-26 MG Take 1 tablet by mouth 2 (two) times daily. 02/24/21  Yes Hackney, Otila Kluver A, FNP  Sodium Chloride-Sodium Bicarb (NETI POT SINUS WASH NA) Place 1 Dose into the nose at bedtime.   Yes [provider]  tiZANidine (ZANAFLEX) 2 MG tablet Take 1 tablet (2 mg total) by mouth at bedtime. 01/25/21  Yes Sowles, Drue Stager, MD  Vitamin D, Ergocalciferol, (DRISDOL) 1.25 MG (50000 UT) CAPS capsule Take 50,000 Units by mouth every Saturday. 11/23/18  Yes [provider]    Review of Systems  Constitutional:  Negative for appetite change and fatigue.  HENT:  Negative for congestion, postnasal drip and sore throat.   Eyes: Negative.   Respiratory:  Negative for cough, chest tightness and shortness of breath.   Cardiovascular:  Positive for leg swelling. Negative for chest pain and palpitations.  Gastrointestinal:  Negative for abdominal distention and abdominal pain.  Endocrine:       Hair loss  Genitourinary: Negative.   Musculoskeletal:  Positive for arthralgias (right hip). Negative for back pain.  Skin: Negative.   Allergic/Immunologic: Negative.   Neurological:  Negative for dizziness and light-headedness.  Hematological:  Negative for adenopathy. Does not bruise/bleed easily.  Psychiatric/Behavioral:  Negative for dysphoric mood and sleep disturbance (sleeping in recliner). The patient is not nervous/anxious.    Vitals:   08/29/21 0939 08/29/21 1331  BP: (!) 170/50 (!) 155/60  Pulse: 60   Resp: 16   SpO2: 99%   Weight: 178 lb 9 oz (81 kg)   Height: 5\' 3"  (1.6 m)    Wt Readings from Last 3 Encounters:  08/29/21 178 lb 9 oz (81 kg)  07/18/21 177 lb 2 oz (80.3 kg)  07/12/21 178 lb (80.7 kg)   Lab Results  Component Value Date   CREATININE 0.85 07/12/2021   CREATININE 0.80 04/12/2021   CREATININE 0.83 03/13/2021   Physical Exam Vitals and nursing note reviewed.  Constitutional:      Appearance: Normal appearance.  HENT:     Head: Normocephalic and atraumatic.  Cardiovascular:     Rate and Rhythm: Normal rate and regular rhythm.  Pulmonary:     Effort: Pulmonary effort is normal. No respiratory distress.     Breath sounds: No wheezing or rales.  Abdominal:     General: There is no distension.     Palpations: Abdomen is soft.  Musculoskeletal:        General: No tenderness.     Cervical back: Normal range of motion and neck supple.     Right lower leg: Edema (1+ pitting) present.     Left lower leg: Edema (1+ pitting)  present.  Skin:    General: Skin is warm and dry.  Neurological:     General: No focal deficit present.     Mental Status: She is alert and oriented to person, place, and time.  Psychiatric:        Mood and Affect: Mood normal.        Behavior: Behavior normal.        Thought Content: Thought content normal.   Assessment & Plan:  1: Chronic heart failure with preserved ejection fraction- - NYHA class I - euvolemic today - weighing daily; reminded to call for an overnight weight gain of > 2 pounds or a weekly weight gain of > 5 pounds.  - weight down 4 lbs from her last visit in November - has increased her activity and is now walking daily for ~ 1/2 hour daily - not adding salt and has been reading food labels - EF has now improved - GDMT of entresto, will titrate up to 49/51, instructed to just take two of her current prescriptions BID - does not qualify for patient assistance for entresto but currently has 3 bottles at home - wearing compression socks daily and removes HS - saw Gollan 07/18/21 - BNP 01/22/21 was 237.3  2: HTN- - BP initially elevated 190/48 > 155/60, will titrate up entresto per above - saw PCP Ancil Boozer) 07/12/20; returns 01/09/22 - BMP 07/12/21 reviewed and showed sodium 141, potassium 4.2, creatinine 0.85 & GFR 70  3: DM- - A1c 03/23/21 was 7.1% - glucose at home today was 155 -  returns to endocrinology Honor Junes) 08/2021  4: Paroxysmal atrial fibrillation- - successfully cardioverted 01/30/21 - saw cardiology (Dunn) 04/12/21 - RRR in office today    Medication bottles reviewed.   Return in 1 month or sooner for any questions/problems before then.

## 2021-09-04 ENCOUNTER — Telehealth: Payer: Self-pay

## 2021-09-04 NOTE — Progress Notes (Signed)
Patient states she needs to reschedule her appointment in April. ? ?Telephone follow up appointment with Care management team member was rescheduled for : 12/20/2021 at 11:00 am. ? ?Anderson Malta ?Clinical Pharmacist Assistant ?[phone]  ? ?

## 2021-09-06 ENCOUNTER — Telehealth: Payer: Medicare Other

## 2021-09-07 ENCOUNTER — Encounter: Payer: Self-pay | Admitting: Podiatry

## 2021-09-07 ENCOUNTER — Ambulatory Visit (INDEPENDENT_AMBULATORY_CARE_PROVIDER_SITE_OTHER): Payer: Medicare Other | Admitting: Podiatry

## 2021-09-07 ENCOUNTER — Other Ambulatory Visit: Payer: Self-pay

## 2021-09-07 DIAGNOSIS — M79675 Pain in left toe(s): Secondary | ICD-10-CM

## 2021-09-07 DIAGNOSIS — B351 Tinea unguium: Secondary | ICD-10-CM | POA: Diagnosis not present

## 2021-09-07 DIAGNOSIS — M79674 Pain in right toe(s): Secondary | ICD-10-CM | POA: Diagnosis not present

## 2021-09-07 NOTE — Progress Notes (Signed)
This patient presents to the office with chief complaint of long thick painful  big nails.  Patient says the nails are painful walking and wearing shoes.  This patient is unable to self treat.  This patient is unable to trim her nails since she is unable to reach her nails. She has surgery by Dr.  Posey Pronto for correction of her third toe left foot..  Patient is diabetic. She presents to the office for preventative foot care services. ? ?General Appearance  Alert, conversant and in no acute stress. ? ?Vascular  Dorsalis pedis and posterior tibial  pulses are palpable  bilaterally.  Capillary return is within normal limits  bilaterally. Temperature is within normal limits  bilaterally. ? ?Neurologic  Senn-Weinstein monofilament wire test within normal limits  bilaterally. Muscle power within normal limits bilaterally. ? ?Nails Thick disfigured discolored nails with subungual debris  hallux nails bilaterally. No evidence of bacterial infection or drainage bilaterally. ? ?Orthopedic  No limitations of motion  feet .  No crepitus or effusions noted.  No bony pathology or digital deformities noted. ? ?Skin  normotropic skin with no porokeratosis noted bilaterally.  No signs of infections or ulcers noted.    ? ?Onychomycosis  Nails  B/L.  Pain in right toes  Pain in left toes ? ?Debridement of nails both feet followed trimming the nails with dremel tool.    RTC 3 months. ? ? ?[name] DPM   ?

## 2021-09-12 DIAGNOSIS — Z20828 Contact with and (suspected) exposure to other viral communicable diseases: Secondary | ICD-10-CM | POA: Diagnosis not present

## 2021-09-14 DIAGNOSIS — Z20822 Contact with and (suspected) exposure to covid-19: Secondary | ICD-10-CM | POA: Diagnosis not present

## 2021-09-19 ENCOUNTER — Telehealth: Payer: Self-pay

## 2021-09-19 NOTE — Progress Notes (Signed)
? ? ?Chronic Care Management ?Pharmacy Assistant  ? ?Name: Bonnie Rowland  MRN: [id number] DOB: 16-Nov-1940 ? ?Reason for Encounter: Hypertension Disease State Call ?  ?Recent office visits:  ?07/12/2021 Steele Sizer, MD (PCP Office Visit) for Follow-up- Changed: Tizanidine HCl 4 mg at bedtime from 2 mg. Lab orders placed, Diabetic Foot Exam Ordered, No follow-up noted ? ?04/20/2021 Clemetine Marker, LPN (PCP Clinical Support) for Medicare Wellness Exam- Stopped: Fluticasone Propionate 50 mcg, No orders placed ? ?Recent consult visits:  ?09/07/2021 [name], DPM (Podiatry) for Nail problem- No medication changes noted, No orders placed, Patient to return in 4 months.  ? ?08/29/2021 [name] FNP (Heart Failure Clinic) for CHF- Stopped: Doxycycline Hyclate 099 mg, Garlic 8338 mg, Losartan Potassium 25 mg due to patient not taking anymore, No orders placed, Patient to return in 1 month ? ?07/18/2021 Ida Rogue, MD (Cardiology) for 3 month follow-up- No medication changes noted, Lab orders placed, Patient to follow-up in 1 year ? ?07/13/2021 Boneta Lucks, DPM (Podiatry) for Wound Check- No medication changes noted, No orders placed, No follow-up noted ? ?07/06/2021 Zara Council, [name] (Urology) for Urinary Incontinence- Stopped: Carvedilol 3.125 mg, Bladder Scan ordered, Patient to follow-up in 1 year ? ?06/22/2021 Boneta Lucks, DPM (Podiatry) for Toe Pain- No medication changes noted, No orders placed, No follow-up noted ? ?06/22/2021 Boneta Lucks, DPM (Podiatry) for Toe Pain- Started: Doxycycline Hyclate 100 mg twice daily, No orders placed, No follow-up noted ? ?05/30/2021 Boneta Lucks, DPM (Podiatry) for Toe Pain- Started: Doxycycline Hyclate 100 mg twice daily, No orders placed, No follow-up noted ? ?05/16/2021 Boneta Lucks, DPM (Podiatry) for Toe Pain- No medication changes noted, No orders placed, Patient instructed to return in 1 week ? ?Hospital visits:  ?None in previous 6  months ? ?Medications: ?Outpatient Encounter Medications as of 09/19/2021  ?Medication Sig  ? acetaminophen (TYLENOL) 650 MG CR tablet Take 1,300 mg by mouth at bedtime.  ? ALFALFA PO Take 1 tablet by mouth daily.  ? amiodarone (PACERONE) 200 MG tablet Take 0.5 tablets (100 mg total) by mouth daily.  ? ascorbic Acid (VITAMIN C) 500 MG CPCR Take 500 mg by mouth daily.  ? B Complex-C (B-COMPLEX WITH VITAMIN C) tablet Take 1 tablet by mouth 2 (two) times daily.  ? BD PEN NEEDLE NANO U/F 32G X 4 MM MISC USE 4 TIMES DAILY (HUMALOG 3 TIMES DAILY AND LANTUS ONCE DAILY) OFFICE NOTIFIED 08/06/17  ? BLACK ELDERBERRY,BERRY-FLOWER, PO Take 15 mLs by mouth daily. Liquid  ? Calcium Carbonate (CALCIUM 600 PO) Take 600 mg by mouth daily.  ? carvedilol (COREG) 3.125 MG tablet Take 3.125 mg by mouth 2 (two) times daily.  ? cholecalciferol (VITAMIN D) 1000 UNITS tablet Take 1,000 Units by mouth daily. Shaklee  ? clobetasol ointment (TEMOVATE) 2.50 % APPLY 1 APPLICATION TOPICALLY 2 TIMES DAILY AS NEEDED (BUG BITES).  ? Coenzyme Q10 (COQ10) 200 MG CAPS Take 200 mg by mouth daily.  ? Continuous Blood Gluc Sensor ([place]) MISC by Does not apply route.  ? Elastic Bandages & Supports (MEDICAL COMPRESSION STOCKINGS) MISC 1 each by Does not apply route daily.  ? ELIQUIS 5 MG TABS tablet TAKE 1 TABLET BY MOUTH TWICE A DAY  ? estradiol (ESTRACE VAGINAL) 0.1 MG/GM vaginal cream 1/2 gm once weekly using applicator, apply blueberry sized amount of cream using tip of finger to urethra twice weekly  ? Exenatide ER (BYDUREON BCISE) 2 MG/0.85ML AUIJ Inject 2 mg into the skin every Sunday.  ?  furosemide (LASIX) 40 MG tablet TAKE 1 TABLET BY MOUTH TWICE A DAY  ? Glucosamine 500 MG TABS Take 500 mg by mouth daily.  ? HUMALOG KWIKPEN 100 UNIT/ML KiwkPen Inject 4-12 Units into the skin 3 (three) times daily before meals. If needed sliding scale  ? insulin glargine (LANTUS) 100 unit/mL SOPN Inject 22 Units into the skin at bedtime.  ?  Krill Oil 1000 MG CAPS Take 1,000 mg by mouth daily.  ? Mag Aspart-Potassium Aspart (POTASSIUM & MAGNESIUM ASPARTAT PO) Take 1 tablet by mouth daily.  ? Multiple Vitamins-Minerals (PRESERVISION AREDS 2) CAPS Take 1 capsule by mouth 2 (two) times daily.  ? OVER THE COUNTER MEDICATION Place 1 application into both eyes See admin instructions. Blephadex eyelid foam, apply to eyelids once daily  ? OVER THE COUNTER MEDICATION Take 1 tablet by mouth daily. Vita-Lea Gold otc supplement ?With out vitamin K (Shaklee products)  ? OVER THE COUNTER MEDICATION Take 1 tablet by mouth See admin instructions. Nutriferon otc supplement take Take 1 tablet on Sun, Tues, Thurs, Sat. Take 1 tablet twice daily on Mon, Wed, and Fri  ? Probiotic Product (PROBIOTIC PO) Take 1 capsule by mouth at bedtime.  ? PROLIA 60 MG/ML SOSY injection Inject 60 mg into the skin every 6 (six) months.  ? Propylene Glycol (SYSTANE BALANCE OP) Place 1 drop into both eyes daily as needed (dry eyes).  ? rosuvastatin (CRESTOR) 10 MG tablet Take 1 tablet (10 mg total) by mouth daily.  ? sacubitril-valsartan (ENTRESTO) 24-26 MG Take 1 tablet by mouth 2 (two) times daily. (Patient taking differently: Take 2 tablets by mouth 2 (two) times daily.)  ? Sodium Chloride-Sodium Bicarb (NETI POT SINUS WASH NA) Place 1 Dose into the nose at bedtime.  ? tiZANidine (ZANAFLEX) 4 MG tablet Take 1 tablet (4 mg total) by mouth at bedtime.  ? Vitamin D, Ergocalciferol, (DRISDOL) 1.25 MG (50000 UT) CAPS capsule Take 50,000 Units by mouth every Saturday.  ? [DISCONTINUED] metoprolol succinate (TOPROL-XL) 50 MG 24 hr tablet Take 1 tablet (50 mg total) by mouth 2 (two) times daily. Take with or immediately following a meal. (Patient not taking: Reported on 01/23/2021)  ? ?No facility-administered encounter medications on file as of 09/19/2021.  ? ?Care Gaps: ?Dexa Scan ?Mammogram ? ?Star Rating Drugs: ?Rosuvastatin 10 mg last filled on 08/27/2021 for a 90-Day supply with [facility] ? ?Reviewed chart prior to disease state call. Spoke with patient regarding BP ? ?Recent Office Vitals: ?BP Readings from Last 3 Encounters:  ?08/29/21 (!) 155/60  ?07/18/21 (!) 160/70  ?07/12/21 136/74  ? ?Pulse Readings from Last 3 Encounters:  ?08/29/21 60  ?07/18/21 60  ?07/12/21 82  ?  ?Wt Readings from Last 3 Encounters:  ?08/29/21 178 lb 9 oz (81 kg)  ?07/18/21 177 lb 2 oz (80.3 kg)  ?07/12/21 178 lb (80.7 kg)  ? ?Kidney Function ?Lab Results  ?Component Value Date/Time  ? CREATININE 0.85 07/12/2021 08:44 AM  ? CREATININE 0.80 04/12/2021 01:54 PM  ? CREATININE 0.83 03/13/2021 10:42 AM  ? CREATININE 0.71 01/09/2021 08:36 AM  ? GFRNONAA >60 03/13/2021 10:42 AM  ? GFRNONAA 88 04/11/2018 10:32 AM  ? GFRAA 102 04/11/2018 10:32 AM  ? ? ?BMP Latest Ref Rng & Units 07/12/2021 04/12/2021 03/13/2021  ?Glucose 65 - 99 mg/dL 90 132(H) 171(H)  ?BUN 7 - 25 mg/dL '14 14 16  '$ ?Creatinine 0.60 - 1.00 mg/dL 0.85 0.80 0.83  ?BUN/Creat Ratio 6 - 22 (calc) NOT APPLICABLE 18 -  ?  Sodium 135 - 146 mmol/L 141 139 139  ?Potassium 3.5 - 5.3 mmol/L 4.2 4.5 3.7  ?Chloride 98 - 110 mmol/L 102 98 102  ?CO2 20 - 32 mmol/L 33(H) 23 27  ?Calcium 8.6 - 10.4 mg/dL 9.3 9.7 8.9  ? ?Current antihypertensive regimen:  ?Lasix 40 mg 1 tablet twice daily ?Eliquis 5 mg 1 tablet twice daily ?Entresto 24-26 mg two tablets twice daily ? ?How often are you checking your Blood Pressure? daily ? ?Current home BP readings: Patient wasn't at home so she is unable to provide me with a recent number. She stated her last visit with her CHF provider it was elevated but it tends to be normal at home ? ?What recent interventions/DTPs have been made by any provider to improve Blood Pressure control since last CPP Visit: Stopped: Carvedilol 3.125 mg, Losartan Potassium 25 mg. Patient is taking Entresto 24-26 mg and she stated the plan is to increase this dosage. She will follow-up with her Heart Failure provider next week.  ? ?Any recent hospitalizations or ED  visits since last visit with CPP? No ? ?What diet changes have been made to improve Blood Pressure Control?  ?Patient doesn't report any new changes to her diet ? ?What exercise is being done to improve your Blood Pressure Control?  ?Patient

## 2021-09-21 DIAGNOSIS — E1159 Type 2 diabetes mellitus with other circulatory complications: Secondary | ICD-10-CM | POA: Diagnosis not present

## 2021-09-21 DIAGNOSIS — M5416 Radiculopathy, lumbar region: Secondary | ICD-10-CM | POA: Diagnosis not present

## 2021-09-21 DIAGNOSIS — M5033 Other cervical disc degeneration, cervicothoracic region: Secondary | ICD-10-CM | POA: Diagnosis not present

## 2021-09-21 DIAGNOSIS — R946 Abnormal results of thyroid function studies: Secondary | ICD-10-CM | POA: Diagnosis not present

## 2021-09-21 DIAGNOSIS — E785 Hyperlipidemia, unspecified: Secondary | ICD-10-CM | POA: Diagnosis not present

## 2021-09-21 DIAGNOSIS — E559 Vitamin D deficiency, unspecified: Secondary | ICD-10-CM | POA: Diagnosis not present

## 2021-09-21 DIAGNOSIS — M25511 Pain in right shoulder: Secondary | ICD-10-CM | POA: Diagnosis not present

## 2021-09-21 DIAGNOSIS — M7521 Bicipital tendinitis, right shoulder: Secondary | ICD-10-CM | POA: Diagnosis not present

## 2021-09-21 DIAGNOSIS — E1165 Type 2 diabetes mellitus with hyperglycemia: Secondary | ICD-10-CM | POA: Diagnosis not present

## 2021-09-21 DIAGNOSIS — M9903 Segmental and somatic dysfunction of lumbar region: Secondary | ICD-10-CM | POA: Diagnosis not present

## 2021-09-21 DIAGNOSIS — M810 Age-related osteoporosis without current pathological fracture: Secondary | ICD-10-CM | POA: Diagnosis not present

## 2021-09-21 DIAGNOSIS — I152 Hypertension secondary to endocrine disorders: Secondary | ICD-10-CM | POA: Diagnosis not present

## 2021-09-21 DIAGNOSIS — M9901 Segmental and somatic dysfunction of cervical region: Secondary | ICD-10-CM | POA: Diagnosis not present

## 2021-09-21 DIAGNOSIS — E1169 Type 2 diabetes mellitus with other specified complication: Secondary | ICD-10-CM | POA: Diagnosis not present

## 2021-09-21 LAB — HEMOGLOBIN A1C: Hemoglobin A1C: 6.7

## 2021-09-21 LAB — HM DEXA SCAN

## 2021-09-22 DIAGNOSIS — Z20822 Contact with and (suspected) exposure to covid-19: Secondary | ICD-10-CM | POA: Diagnosis not present

## 2021-09-25 NOTE — Progress Notes (Signed)
Patient ID: Bonnie Rowland, female    DOB: [birth year], 81 y.o.   MRN: [id number]    Bonnie Rowland is a 81 y/o female with a history of HTN, DM, hyperlipidemia, atrial fibrillation, GERD and chronic heart failure.   Echo report from 03/31/21 reviewed and showed an EF of  60-65% without structural changes. Echo report from 01/23/21 reviewed and showed an EF of 45-50% with mild LVH, severe hypokinesis of left ventricle and trivial MR.   RHC/LHC done 01/25/21 and showed: Mild to moderate, non-obstructive coronary artery disease, including 30% mid LAD disease, 50-60% mid/distal LCx stenosis, and 60% proximal/mid RCA lesion. Mildly to moderately reduced left ventricular systolic function with mid anterior hypo/akinesis; query atypical Takotsubo variant.  LVEF ~45%. Mildly elevated left heart filling pressures. Moderately elevated right heart filling and pulmonary artery pressures with significant pulmonary vascular resistance. Mildly reduced Fick cardiac output/index.  Has not been admitted or been in the ED in the last 6 months.   She presents today for a follow-up visit with a chief complaint of minimal fatigue with moderate exertion and pedal edema. She describes this as chronic in nature having been present for several years. She does wear compression socks daily which do help with the edema. She denies any difficulty sleeping, dizziness, abdominal distention, palpitations, chest pain, shortness of breath, cough, fatigue or weight gain.   Walking for ~ 1/2 hour daily.   Weighing daily and reports her weight does not fluctuate more than 3 lbs.   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; left shoulder blade   Cancer ([place]) 05/2014   basal cell removed left temple   Cataract 2003   Cellulitis and abscess of leg, except foot    Conjunctivitis unspecified    Dermatophytosis of nail    Diabetes mellitus    type II   Esophageal reflux    Hyperlipidemia    Hypertension    Osteoporosis 2016   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)    Renal artery stenosis (HCC)    Sprain of lumbar region    Thoracic or lumbosacral neuritis or radiculitis, unspecified    Urinary tract infection, site not specified    Past Surgical History:  Procedure Laterality Date   APPENDECTOMY     CARDIAC CATHETERIZATION     CARDIOVERSION N/A 11/30/2020   Procedure: CARDIOVERSION;  Surgeon: Minna Merritts, MD;  Location: [facility];  Service: Cardiovascular;  Laterality: N/A;   CARDIOVERSION N/A 01/20/2021   Procedure: CARDIOVERSION;  Surgeon: Minna Merritts, MD;  Location: [facility];  Service: Cardiovascular;  Laterality: N/A;   CARDIOVERSION N/A 01/30/2021   Procedure: CARDIOVERSION;  Surgeon: Wellington Hampshire, MD;  Location: [facility];  Service: Cardiovascular;  Laterality: N/A;   cataract surgery     COLONOSCOPY     COLONOSCOPY WITH PROPOFOL N/A 05/09/2016   Procedure: COLONOSCOPY WITH PROPOFOL;  Surgeon: Robert Bellow, MD;  Location: [facility];  Service: Endoscopy;  Laterality: N/A;   DIAGNOSTIC MAMMOGRAM     EYE SURGERY Bilateral    Cataract Extraction   JOINT REPLACEMENT  2016   KNEE ARTHROPLASTY  Right 03/30/2015   Procedure: COMPUTER ASSISTED TOTAL KNEE ARTHROPLASTY;  Surgeon: Dereck Leep, MD;  Location: [facility];  Service: Orthopedics;  Laterality: Right;   KNEE ARTHROSCOPY Right    KNEE CLOSED REDUCTION Right 05/23/2015   Procedure: CLOSED MANIPULATION KNEE;  Surgeon: Dereck Leep, MD;  Location: [facility];  Service: Orthopedics;  Laterality: Right;   RIGHT/LEFT HEART CATH AND CORONARY ANGIOGRAPHY N/A 01/25/2021   Procedure: RIGHT/LEFT HEART CATH AND  CORONARY ANGIOGRAPHY;  Surgeon: Nelva Bush, MD;  Location: [facility];  Service: Cardiovascular;  Laterality: N/A;   TONSILLECTOMY     Family History  Problem Relation Age of Onset   Diabetes Mother    Hypertension Mother    Cancer Mother    Kidney disease Mother    Hypertension Father    Cancer Father        Prostate   Breast cancer Maternal Aunt 31   Colon cancer Son    Kidney cancer Neg Hx    Bladder Cancer Neg Hx    Social History   Tobacco Use   Smoking status: Never   Smokeless tobacco: Never   Tobacco comments:    Smoking cessation materials not required  Substance Use Topics   Alcohol use: Not Currently    Alcohol/week: 1.0 standard drink    Types: 1 Glasses of wine per week   Allergies  Allergen Reactions   Cyclobenzaprine Hypertension   Cheese Other (See Comments)    bloating   Ciprofloxacin Hcl Other (See Comments)    Muscle pain   Coconut Oil     Upset stomach   Keflex [Cephalexin] Other (See Comments)    Patient prefers not to take this medication due to the side effects, caused tendon issues   Prior to Admission medications   Medication Sig Start Date End Date Taking? Authorizing Provider  acetaminophen (TYLENOL) 650 MG CR tablet Take 1,300 mg by mouth at bedtime.   Yes [provider]  ALFALFA PO Take 1 tablet by mouth daily.   Yes [provider]  amiodarone (PACERONE) 200 MG tablet Take 0.5 tablets (100 mg total) by mouth daily. 04/12/21  Yes Dunn, Areta Haber, [name]  ascorbic Acid (VITAMIN C) 500 MG CPCR Take 500 mg by mouth daily.   Yes [provider]  B Complex-C (B-COMPLEX WITH VITAMIN C) tablet Take 1 tablet by mouth 2 (two) times daily.   Yes [provider]  BD PEN NEEDLE NANO U/F 32G X 4 MM MISC USE 4 TIMES DAILY (HUMALOG 3 TIMES DAILY AND LANTUS ONCE DAILY) OFFICE NOTIFIED 08/06/17 12/07/18  Yes Sowles, Drue Stager, MD  BLACK ELDERBERRY,BERRY-FLOWER, PO Take 15 mLs by mouth daily. Liquid   Yes [provider]  Calcium Carbonate (CALCIUM 600 PO) Take 600 mg by mouth daily.   Yes [provider]  carvedilol (COREG) 3.125 MG tablet Take 1 tablet (3.125 mg total) by mouth 2 (two) times daily. 02/10/21 05/11/21 Yes Dunn, Areta Haber, [name]  cholecalciferol (VITAMIN D) 1000 UNITS tablet Take 1,000 Units by mouth daily. [place]   Yes [provider]  clobetasol ointment (TEMOVATE) 2.42 % APPLY 1 APPLICATION TOPICALLY 2 TIMES DAILY AS NEEDED (BUG BITES). 05/03/21  Yes Sowles, Drue Stager, MD  Coenzyme Q10 (COQ10) 200 MG CAPS Take 200 mg by mouth daily.   Yes [provider]  Continuous Blood Gluc Sensor ([place]) MISC by Does not apply route.   Yes [provider]  Elastic Bandages & Supports (  MEDICAL COMPRESSION STOCKINGS) MISC 1 each by Does not apply route daily. 01/08/19  Yes Sowles, Drue Stager, MD  ELIQUIS 5 MG TABS tablet TAKE 1 TABLET BY MOUTH TWICE A DAY 02/28/21  Yes Gollan, Kathlene November, MD  estradiol (ESTRACE VAGINAL) 0.1 MG/GM vaginal cream 1/2 gm once weekly using applicator, apply blueberry sized amount of cream using tip of finger to urethra twice weekly 06/10/20  Yes McGowan, Larene Beach A, [name]  Exenatide ER (BYDUREON BCISE) 2 MG/0.85ML AUIJ Inject 2 mg into the skin every Sunday. 02/26/20  Yes [provider]  furosemide (LASIX) 40 MG tablet Take 1 tablet (40 mg total) by mouth 2 (two) times daily. 02/24/21  Yes Hackney, Tina A, FNP  GARLIC 5885 PO Take 0,277 mg by mouth daily.   Yes [provider]  Glucosamine 500 MG TABS Take 500 mg by mouth daily.   Yes [provider]  HUMALOG KWIKPEN 100 UNIT/ML KiwkPen Inject 4-12 Units into the skin 3 (three) times daily before meals. If needed sliding scale 10/15/17  Yes [provider]  insulin glargine (LANTUS) 100 unit/mL SOPN Inject 22 Units into the skin at bedtime.   Yes [provider]  Javier Docker Oil 1000 MG CAPS Take 1,000 mg by mouth daily.   Yes [provider]  losartan (COZAAR) 25 MG tablet Take 25 mg by mouth daily.   Yes [provider]  Mag Aspart-Potassium Aspart (POTASSIUM & MAGNESIUM ASPARTAT PO) Take 1 tablet by mouth daily.   Yes [provider]  Multiple Vitamins-Minerals (PRESERVISION AREDS 2) CAPS Take 1 capsule by mouth 2 (two) times daily.   Yes [provider]  OVER THE COUNTER MEDICATION Place 1 application into both eyes See admin instructions. Blephadex eyelid foam, apply to eyelids once daily   Yes [provider]  OVER THE COUNTER MEDICATION Take 1 tablet by mouth daily. Vita-Lea Gold otc supplement With out vitamin K ([place] products)   Yes [provider]  OVER THE COUNTER MEDICATION Take 1 tablet by mouth See admin instructions. Nutriferon otc supplement take Take 1 tablet on Sun, Tues, Thurs, Sat. Take 1 tablet twice daily on Mon, Wed, and Fri   Yes [provider]  Probiotic Product (PROBIOTIC PO) Take 1 capsule by mouth at bedtime.   Yes [provider]  PROLIA 60 MG/ML SOSY injection Inject 60 mg into the skin every 6 (six) months. 09/02/19  Yes [provider]  Propylene Glycol (SYSTANE BALANCE OP) Place 1 drop into both eyes daily as needed (dry eyes).   Yes [provider]  rosuvastatin (CRESTOR) 10 MG tablet TAKE 1 TABLET (10 MG TOTAL) BY MOUTH 4 (FOUR) TIMES A WEEK. 02/21/21  Yes Sowles, Drue Stager, MD  sacubitril-valsartan (ENTRESTO) 24-26 MG Take 2 tablets by mouth 2 (two) times daily. 02/24/21  Yes Hackney, Otila Kluver A, FNP  Sodium Chloride-Sodium Bicarb (NETI POT SINUS WASH NA) Place 1 Dose into the nose at bedtime.   Yes [provider]  tiZANidine (ZANAFLEX) 2 MG tablet Take 1 tablet (2 mg total) by mouth at bedtime. 01/25/21  Yes Sowles, Drue Stager, MD  Vitamin D, Ergocalciferol, (DRISDOL) 1.25 MG (50000 UT) CAPS capsule Take 50,000 Units by mouth every Saturday. 11/23/18  Yes [provider]    Review of Systems   Constitutional:  Positive for fatigue. Negative for appetite change.  HENT:  Negative for congestion, postnasal drip and sore throat.   Eyes: Negative.   Respiratory:  Negative for cough, chest tightness and shortness  of breath.   Cardiovascular:  Positive for leg swelling. Negative for chest pain and palpitations.  Gastrointestinal:  Negative for abdominal distention and abdominal pain.  Endocrine:       Hair loss  Genitourinary: Negative.   Musculoskeletal:  Positive for arthralgias (right hip). Negative for back pain.  Skin: Negative.   Allergic/Immunologic: Negative.   Neurological:  Positive for dizziness (few occasions after entresto up titration). Negative for light-headedness.  Hematological:  Negative for adenopathy. Does not bruise/bleed easily.  Psychiatric/Behavioral:  Negative for dysphoric mood and sleep disturbance (sleeping in recliner). The patient is not nervous/anxious.    Vitals:   09/26/21 1024 09/26/21 1043  BP: (!) 174/80 (!) 158/62  Pulse: (!) 55   Resp: 18   SpO2: 100%   Weight: 178 lb 4 oz (80.9 kg)   Height: '5\' 4"'$  (1.626 m)    Wt Readings from Last 3 Encounters:  09/26/21 178 lb 4 oz (80.9 kg)  08/29/21 178 lb 9 oz (81 kg)  07/18/21 177 lb 2 oz (80.3 kg)   Lab Results  Component Value Date   CREATININE 0.85 07/12/2021   CREATININE 0.80 04/12/2021   CREATININE 0.83 03/13/2021   Physical Exam Vitals and nursing note reviewed.  Constitutional:      General: She is not in acute distress.    Appearance: Normal appearance.  HENT:     Head: Normocephalic and atraumatic.  Cardiovascular:     Rate and Rhythm: Normal rate and regular rhythm.  Pulmonary:     Effort: Pulmonary effort is normal. No respiratory distress.     Breath sounds: No wheezing or rales.  Abdominal:     General: There is no distension.     Palpations: Abdomen is soft.  Musculoskeletal:        General: No tenderness.     Cervical back: Normal range of motion and neck supple.      Right lower leg: Edema (1+ pitting) present.     Left lower leg: Edema (trace) present.  Skin:    General: Skin is warm and dry.  Neurological:     General: No focal deficit present.     Mental Status: She is alert and oriented to person, place, and time.  Psychiatric:        Mood and Affect: Mood normal.        Behavior: Behavior normal.        Thought Content: Thought content normal.   Assessment & Plan:  1: Chronic heart failure with preserved ejection fraction- - NYHA class II - euvolemic today - weighing daily; reminded to call for an overnight weight gain of > 2 pounds or a weekly weight gain of > 5 pounds.  - weigh unchanged from her last visit - has increased her activity and is now walking daily for ~ 1/2 hour daily - not adding salt and has been reading food labels - GDMT of entresto 49/51; dose titrated up at last visit she reports occasional dizziness - wearing compression socks daily and removes HS - saw Gollan 07/18/21 - BNP 01/22/21 was 237.3  2: HTN- - BP initially elevated 174/80 > 158/62; saw two other providers recently and BP readings were 130/72 and 118/76 - saw PCP Ancil Boozer) 07/12/20; returns 01/09/22 - BMP 09/13/21 reviewed and showed sodium 147, potassium 4.2, creatinine 0.8  & GFR 70  3: DM- - A1c 09/21/21 was 6.7% - saw endocrinology Honor Junes) 09/21/21  4: Paroxysmal atrial fibrillation- - successfully cardioverted 01/30/21 - saw cardiology (  Gollan) 07/18/21; returns 12 months - RRR in office today    Medication bottles reviewed.   Return in 3 months or sooner for any questions/problems before then.

## 2021-09-26 ENCOUNTER — Ambulatory Visit: Payer: Medicare Other | Attending: Family | Admitting: Family

## 2021-09-26 ENCOUNTER — Encounter: Payer: Self-pay | Admitting: Family

## 2021-09-26 ENCOUNTER — Other Ambulatory Visit: Payer: Self-pay

## 2021-09-26 VITALS — BP 158/62 | HR 55 | Resp 18 | Ht 64.0 in | Wt 178.2 lb

## 2021-09-26 DIAGNOSIS — I110 Hypertensive heart disease with heart failure: Secondary | ICD-10-CM | POA: Diagnosis not present

## 2021-09-26 DIAGNOSIS — I2510 Atherosclerotic heart disease of native coronary artery without angina pectoris: Secondary | ICD-10-CM

## 2021-09-26 DIAGNOSIS — E785 Hyperlipidemia, unspecified: Secondary | ICD-10-CM | POA: Diagnosis not present

## 2021-09-26 DIAGNOSIS — I5032 Chronic diastolic (congestive) heart failure: Secondary | ICD-10-CM

## 2021-09-26 DIAGNOSIS — R609 Edema, unspecified: Secondary | ICD-10-CM | POA: Diagnosis not present

## 2021-09-26 DIAGNOSIS — E1169 Type 2 diabetes mellitus with other specified complication: Secondary | ICD-10-CM | POA: Diagnosis not present

## 2021-09-26 DIAGNOSIS — R5383 Other fatigue: Secondary | ICD-10-CM | POA: Insufficient documentation

## 2021-09-26 DIAGNOSIS — I10 Essential (primary) hypertension: Secondary | ICD-10-CM | POA: Diagnosis not present

## 2021-09-26 DIAGNOSIS — I5042 Chronic combined systolic (congestive) and diastolic (congestive) heart failure: Secondary | ICD-10-CM | POA: Diagnosis not present

## 2021-09-26 DIAGNOSIS — K219 Gastro-esophageal reflux disease without esophagitis: Secondary | ICD-10-CM | POA: Insufficient documentation

## 2021-09-26 DIAGNOSIS — I480 Paroxysmal atrial fibrillation: Secondary | ICD-10-CM | POA: Insufficient documentation

## 2021-09-26 DIAGNOSIS — E1136 Type 2 diabetes mellitus with diabetic cataract: Secondary | ICD-10-CM | POA: Diagnosis not present

## 2021-09-26 MED ORDER — ENTRESTO 49-51 MG PO TABS: 1.0000 | ORAL_TABLET | Freq: Two times a day (BID) | ORAL | 5 refills | Status: DC

## 2021-09-26 NOTE — Patient Instructions (Signed)
Continue to weigh daily, if you gain >2 or 3 lbs overnight, call us. ? ?Once you finish your current entresto bottle, your next bottle will be one pill in am an at night time. ? ?Return in 3 months.  ?

## 2021-09-28 DIAGNOSIS — M25511 Pain in right shoulder: Secondary | ICD-10-CM | POA: Diagnosis not present

## 2021-09-28 DIAGNOSIS — G8929 Other chronic pain: Secondary | ICD-10-CM | POA: Diagnosis not present

## 2021-09-28 DIAGNOSIS — M7521 Bicipital tendinitis, right shoulder: Secondary | ICD-10-CM | POA: Diagnosis not present

## 2021-10-03 ENCOUNTER — Telehealth: Payer: Self-pay | Admitting: *Deleted

## 2021-10-03 DIAGNOSIS — Z20828 Contact with and (suspected) exposure to other viral communicable diseases: Secondary | ICD-10-CM | POA: Diagnosis not present

## 2021-10-03 DIAGNOSIS — Z1152 Encounter for screening for COVID-19: Secondary | ICD-10-CM | POA: Diagnosis not present

## 2021-10-03 NOTE — Chronic Care Management (AMB) (Signed)
?  Chronic Care Management  ? ?Note ? ?10/03/2021 ?Name: Bonnie Rowland MRN: [id number] DOB: 1941/02/26 ? ?Bonnie Rowland is a 81 y.o. year old female who is a primary care patient of Steele Sizer, MD. Bonnie Rowland is currently enrolled in care management services. An additional referral for RNCM  was placed.  ? ?Follow up plan: ?Telephone appointment with care management team member scheduled for: 10/12/2021 ? ?Kimerly Rowand, CCMA ?Care Guide, Embedded Care Coordination ?[place]  Care Management  ?Direct Dial: [phone] ? ? ?

## 2021-10-05 DIAGNOSIS — Z20822 Contact with and (suspected) exposure to covid-19: Secondary | ICD-10-CM | POA: Diagnosis not present

## 2021-10-11 ENCOUNTER — Other Ambulatory Visit: Payer: Self-pay | Admitting: Family

## 2021-10-11 MED ORDER — ENTRESTO 49-51 MG PO TABS: 1.0000 | ORAL_TABLET | Freq: Two times a day (BID) | ORAL | 3 refills | Status: DC

## 2021-10-11 NOTE — Progress Notes (Signed)
Patient requests 90 day supply of entresto ?

## 2021-10-12 ENCOUNTER — Telehealth: Payer: Medicare Other

## 2021-10-12 ENCOUNTER — Other Ambulatory Visit: Payer: Self-pay | Admitting: Family Medicine

## 2021-10-12 ENCOUNTER — Encounter: Payer: Self-pay | Admitting: Podiatry

## 2021-10-12 ENCOUNTER — Telehealth: Payer: Self-pay

## 2021-10-12 ENCOUNTER — Ambulatory Visit (INDEPENDENT_AMBULATORY_CARE_PROVIDER_SITE_OTHER): Payer: Medicare Other | Admitting: Podiatry

## 2021-10-12 DIAGNOSIS — M9903 Segmental and somatic dysfunction of lumbar region: Secondary | ICD-10-CM | POA: Diagnosis not present

## 2021-10-12 DIAGNOSIS — Q828 Other specified congenital malformations of skin: Secondary | ICD-10-CM | POA: Diagnosis not present

## 2021-10-12 DIAGNOSIS — Z7901 Long term (current) use of anticoagulants: Secondary | ICD-10-CM | POA: Diagnosis not present

## 2021-10-12 DIAGNOSIS — M9901 Segmental and somatic dysfunction of cervical region: Secondary | ICD-10-CM | POA: Diagnosis not present

## 2021-10-12 DIAGNOSIS — M5033 Other cervical disc degeneration, cervicothoracic region: Secondary | ICD-10-CM | POA: Diagnosis not present

## 2021-10-12 DIAGNOSIS — Z1231 Encounter for screening mammogram for malignant neoplasm of breast: Secondary | ICD-10-CM

## 2021-10-12 DIAGNOSIS — M5416 Radiculopathy, lumbar region: Secondary | ICD-10-CM | POA: Diagnosis not present

## 2021-10-12 NOTE — Telephone Encounter (Signed)
?  Care Management  ? ?Follow Up Note ? ? ?10/12/2021 ?Name: Bonnie Rowland MRN: [id number] DOB: 02-15-1941 ? ? ?Primary Care Provider: Steele Sizer, MD ?Reason for referral : Chronic Care Management ? ? ?An unsuccessful telephone outreach was attempted today. The patient was referred to the case management team for assistance with care management and care coordination.  ? ?Follow Up Plan:  ?A HIPAA compliant voice message was left today requesting a return call. ? ? ?Takota Cahalan,RN ?[place] Management ?[facility] ?[phone] ? ?

## 2021-10-12 NOTE — Progress Notes (Signed)
This patient presents to the office saying she has developed painful callus both feet.  She says they are painful walking.  She was not treated for callus her last visit.  She presnets to the office for evaluation and treatment. ? ?General Appearance  Alert, conversant and in no acute stress. ? ?Vascular  Dorsalis pedis and posterior tibial  pulses are palpable  bilaterally.  Capillary return is within normal limits  bilaterally. Temperature is within normal limits  bilaterally. ? ?Neurologic  Senn-Weinstein monofilament wire test within normal limits  bilaterally. Muscle power within normal limits bilaterally. ? ?Nails Thick disfigured discolored nails with subungual debris  from hallux to fifth toes bilaterally. No evidence of bacterial infection or drainage bilaterally. ? ?Orthopedic  No limitations of motion  feet .  No crepitus or effusions noted.  No bony pathology or digital deformities noted. ? ?Skin  normotropic skin with no porokeratosis noted bilaterally.  No signs of infections or ulcers noted.   Callus sub 5th met right foot.  Callus on outside left heel. ? ?Callus/Porokeratosis  B/l. ? ?Debride callus with # 15 blade and dremel tool. ? ?[name] DPM  ? ?

## 2021-10-17 DIAGNOSIS — Z96651 Presence of right artificial knee joint: Secondary | ICD-10-CM | POA: Diagnosis not present

## 2021-10-18 ENCOUNTER — Telehealth: Payer: Medicare Other

## 2021-10-20 ENCOUNTER — Other Ambulatory Visit: Payer: Self-pay | Admitting: Family Medicine

## 2021-10-20 DIAGNOSIS — I701 Atherosclerosis of renal artery: Secondary | ICD-10-CM

## 2021-10-20 DIAGNOSIS — E1169 Type 2 diabetes mellitus with other specified complication: Secondary | ICD-10-CM

## 2021-10-25 ENCOUNTER — Encounter: Payer: Self-pay | Admitting: Family Medicine

## 2021-10-25 ENCOUNTER — Telehealth: Payer: Self-pay

## 2021-10-25 ENCOUNTER — Other Ambulatory Visit: Payer: Self-pay

## 2021-10-25 ENCOUNTER — Telehealth: Payer: Self-pay | Admitting: Family Medicine

## 2021-10-25 DIAGNOSIS — M62830 Muscle spasm of back: Secondary | ICD-10-CM

## 2021-10-25 NOTE — Telephone Encounter (Signed)
Returned call, left voicemail with information.  ?

## 2021-10-25 NOTE — Telephone Encounter (Signed)
Copied from [place] [id number]. Topic: General - Other ?>> Oct 25, 2021 10:13 AM Leward Quan A wrote: ?Reason for CRM: Patient called in to inquire of Dr Ancil Boozer to please send her the tiZANidine (ZANAFLEX) 2 MG tablet because there are days when she need less and when she was on this strength she had the option to do more when she need it. Patient would like a call back from Dr Ancil Boozer with an answer please at  Ph# [phone] ?

## 2021-10-25 NOTE — Telephone Encounter (Signed)
Left vm to relay information per Dr. Ancil Boozer to cut the '4mg'$  tizanadine in half on the days she only needs '2mg'$ .  ?

## 2021-10-27 MED ORDER — TIZANIDINE HCL 2 MG PO TABS: 2.0000 mg | ORAL_TABLET | Freq: Every day | ORAL | 0 refills | Status: DC

## 2021-10-30 DIAGNOSIS — Z20822 Contact with and (suspected) exposure to covid-19: Secondary | ICD-10-CM | POA: Diagnosis not present

## 2021-11-02 DIAGNOSIS — M5033 Other cervical disc degeneration, cervicothoracic region: Secondary | ICD-10-CM | POA: Diagnosis not present

## 2021-11-02 DIAGNOSIS — M9901 Segmental and somatic dysfunction of cervical region: Secondary | ICD-10-CM | POA: Diagnosis not present

## 2021-11-02 DIAGNOSIS — M9903 Segmental and somatic dysfunction of lumbar region: Secondary | ICD-10-CM | POA: Diagnosis not present

## 2021-11-02 DIAGNOSIS — M5416 Radiculopathy, lumbar region: Secondary | ICD-10-CM | POA: Diagnosis not present

## 2021-11-06 DIAGNOSIS — E119 Type 2 diabetes mellitus without complications: Secondary | ICD-10-CM | POA: Diagnosis not present

## 2021-11-06 DIAGNOSIS — Q828 Other specified congenital malformations of skin: Secondary | ICD-10-CM | POA: Diagnosis not present

## 2021-11-06 DIAGNOSIS — B351 Tinea unguium: Secondary | ICD-10-CM | POA: Diagnosis not present

## 2021-11-06 DIAGNOSIS — M2042 Other hammer toe(s) (acquired), left foot: Secondary | ICD-10-CM | POA: Diagnosis not present

## 2021-11-06 DIAGNOSIS — M2041 Other hammer toe(s) (acquired), right foot: Secondary | ICD-10-CM | POA: Diagnosis not present

## 2021-11-06 DIAGNOSIS — L600 Ingrowing nail: Secondary | ICD-10-CM | POA: Diagnosis not present

## 2021-11-09 ENCOUNTER — Other Ambulatory Visit: Payer: Self-pay | Admitting: Family

## 2021-11-16 ENCOUNTER — Ambulatory Visit
Admission: RE | Admit: 2021-11-16 | Discharge: 2021-11-16 | Disposition: A | Discharge status: Home / Self Care | Payer: Medicare Other | Source: Ambulatory Visit | Attending: Family Medicine | Admitting: Family Medicine

## 2021-11-16 DIAGNOSIS — Z1231 Encounter for screening mammogram for malignant neoplasm of breast: Secondary | ICD-10-CM | POA: Diagnosis not present

## 2021-11-16 DIAGNOSIS — M9901 Segmental and somatic dysfunction of cervical region: Secondary | ICD-10-CM | POA: Diagnosis not present

## 2021-11-16 DIAGNOSIS — M9903 Segmental and somatic dysfunction of lumbar region: Secondary | ICD-10-CM | POA: Diagnosis not present

## 2021-11-16 DIAGNOSIS — M5416 Radiculopathy, lumbar region: Secondary | ICD-10-CM | POA: Diagnosis not present

## 2021-11-16 DIAGNOSIS — M5033 Other cervical disc degeneration, cervicothoracic region: Secondary | ICD-10-CM | POA: Diagnosis not present

## 2021-11-28 DIAGNOSIS — Z23 Encounter for immunization: Secondary | ICD-10-CM | POA: Diagnosis not present

## 2021-12-05 ENCOUNTER — Other Ambulatory Visit: Payer: Self-pay | Admitting: Family Medicine

## 2021-12-05 DIAGNOSIS — W57XXXD Bitten or stung by nonvenomous insect and other nonvenomous arthropods, subsequent encounter: Secondary | ICD-10-CM

## 2021-12-18 DIAGNOSIS — M5416 Radiculopathy, lumbar region: Secondary | ICD-10-CM | POA: Diagnosis not present

## 2021-12-18 DIAGNOSIS — M9901 Segmental and somatic dysfunction of cervical region: Secondary | ICD-10-CM | POA: Diagnosis not present

## 2021-12-18 DIAGNOSIS — M9903 Segmental and somatic dysfunction of lumbar region: Secondary | ICD-10-CM | POA: Diagnosis not present

## 2021-12-18 DIAGNOSIS — M5033 Other cervical disc degeneration, cervicothoracic region: Secondary | ICD-10-CM | POA: Diagnosis not present

## 2021-12-19 ENCOUNTER — Telehealth: Payer: Self-pay

## 2021-12-19 NOTE — Progress Notes (Signed)
    Chronic Care Management Pharmacy Assistant   Name: ALYENE PREDMORE  MRN: [id number] DOB: 03-27-1941  Patient called to be reminded of her telephone appointment with Junius Argyle, CPP on 12/20/2021 @ 1115.  Patient aware of appointment date, time, and type of appointment (either telephone or in person). Patient aware to have/bring all medications, supplements, blood pressure and/or blood sugar logs to visit.  Star Rating Drug: Rosuvastatin 10 mg last filled on 12/09/2021 for a 90-Day supply with [facility]  Any gaps in medications fill history?  Care Gaps: Dexa Scan Hemoglobin A1C   Lynann Bologna, CPA/CMA Clinical Pharmacist Assistant Phone: [phone]

## 2021-12-20 ENCOUNTER — Ambulatory Visit: Payer: Medicare Other

## 2021-12-20 ENCOUNTER — Telehealth: Payer: Medicare Other

## 2021-12-20 NOTE — Progress Notes (Incomplete)
Chronic Care Management Pharmacy Note  12/20/2021 Name:  Bonnie Rowland MRN:  [id number] DOB:  31-Dec-1940  Summary: Patient presents for CCM follow-up  Recommendations/Changes made from today's visit: ***  Plan: CPP follow-up in 3 months    Subjective: Bonnie Rowland is an 81 y.o. year old female who is a primary patient of Steele Sizer, MD.  The CCM team was consulted for assistance with disease management and care coordination needs.    Engaged with patient by telephone for follow up visit in response to provider referral for pharmacy case management and/or care coordination services.   Consent to Services:  The patient was given information about Chronic Care Management services, agreed to services, and gave verbal consent prior to initiation of services.  Please see initial visit note for detailed documentation.   Patient Care Team: Steele Sizer, MD as PCP - General (Family Medicine) Minna Merritts, MD as Consulting Physician (Cardiology) Dasher, Rayvon Char, MD as Consulting Physician (Dermatology) Estill Cotta, MD as Consulting Physician (Ophthalmology) Lonia Farber, MD as Consulting Physician (Internal Medicine) Beverly Gust, MD as Consulting Physician (Otolaryngology) Iris Pert, [place] as Referring Physician (Chiropractic Medicine) Germaine Pomfret, [facility] as Pharmacist (Pharmacist) Neldon Labella, RN as Registered Nurse  Recent office visits: ***  Recent consult visits: 09/26/21: Patient presented to [name], [place] (Cardiology) for Follow-up. Dizziness with Entresto improved.   Hospital visits:  Admitted to the hospital on 01/23/2021 due to HF exacerbation. Discharge date was 01/31/2021 Discharged from [facility].    Admitted to the hospital on 11/30/2020 due to Cardioversion. Discharge date was 11/30/2020 Discharged from [facility]?Medications Started at  [facility] Discharge:?? -started - None ID   Medication Changes at Hospital Discharge: -Changed None ID   Medications Discontinued at Hospital Discharge: -Stopped None ID   Medications that remain the same after Hospital Discharge:?? -All other medications will remain the same.   Objective:  Lab Results  Component Value Date   CREATININE 0.85 07/12/2021   BUN 14 07/12/2021   GFRNONAA >60 03/13/2021   GFRAA 102 04/11/2018   NA 141 07/12/2021   K 4.2 07/12/2021   CALCIUM 9.3 07/12/2021   CO2 33 (H) 07/12/2021   GLUCOSE 90 07/12/2021    Lab Results  Component Value Date/Time   HGBA1C 7.1 03/23/2021 12:00 AM   HGBA1C 6.5 (H) 01/09/2021 08:36 AM   HGBA1C 6.5 09/13/2020 12:00 AM   MICROALBUR Negative 08/21/2019 12:00 AM   MICROALBUR 0 02/18/2017 09:01 AM   MICROALBUR 50 04/23/2016 10:37 AM    Last diabetic Eye exam:  Lab Results  Component Value Date/Time   HMDIABEYEEXA Retinopathy (A) 01/17/2021 12:00 AM    Last diabetic Foot exam: No results found for: "HMDIABFOOTEX"   Lab Results  Component Value Date   CHOL 127 07/12/2021   HDL 70 07/12/2021   LDLCALC 43 07/12/2021   TRIG 59 07/12/2021   CHOLHDL 1.8 07/12/2021       Latest Ref Rng & Units 07/12/2021    8:44 AM 04/12/2021    1:54 PM 01/23/2021    4:13 AM  Hepatic Function  Total Protein 6.1 - 8.1 g/dL 6.4  6.9  7.1   Albumin 3.7 - 4.7 g/dL  4.3  4.0   AST 10 - 35 U/L 35  31  42   ALT 6 - 29 U/L 23  20  34   Alk Phosphatase 44 - 121 IU/L  78  81   Total Bilirubin 0.2 - 1.2 mg/dL 0.8  0.7  1.6     Lab Results  Component Value Date/Time   TSH 8.69 (H) 07/12/2021 08:44 AM   TSH 5.030 (H) 04/12/2021 01:54 PM   FREET4 1.53 04/25/2021 10:32 AM       Latest Ref Rng & Units 07/12/2021    8:44 AM 01/31/2021    7:03 AM 01/30/2021    6:24 AM  CBC  WBC 3.8 - 10.8 Thousand/uL 4.9  8.4  9.4   Hemoglobin 11.7 - 15.5 g/dL 13.0  13.9  14.2   Hematocrit 35.0 - 45.0 % 39.3  41.1  41.1   Platelets 140 - 400  Thousand/uL 181  285  275     Lab Results  Component Value Date/Time   VD25OH 28 (L) 08/22/2017 09:35 AM    Clinical ASCVD: No  The ASCVD Risk score (Arnett DK, et al., 2019) failed to calculate for the following reasons:   The valid total cholesterol range is 130 to 320 mg/dL       08/29/2021    9:46 AM 07/12/2021    7:56 AM 05/09/2021    9:44 AM  Depression screen PHQ 2/9  Decreased Interest 0 0 0  Down, Depressed, Hopeless 0 0 0  PHQ - 2 Score 0 0 0  Altered sleeping  0   Tired, decreased energy  0   Change in appetite  0   Feeling bad or failure about yourself   0   Trouble concentrating  0   Moving slowly or fidgety/restless  0   Suicidal thoughts  0   PHQ-9 Score  0     -Last DEXA Scan: 09/21/21  T-Score femoral neck: -2.3  T-Score total hip: -1.2  T-Score lumbar spine: +0.8  T-Score forearm radius: -2.9    Social History   Tobacco Use  Smoking Status Never  Smokeless Tobacco Never  Tobacco Comments   Smoking cessation materials not required   BP Readings from Last 3 Encounters:  09/26/21 (!) 158/62  08/29/21 (!) 155/60  07/18/21 (!) 160/70   Pulse Readings from Last 3 Encounters:  09/26/21 (!) 55  08/29/21 60  07/18/21 60   Wt Readings from Last 3 Encounters:  09/26/21 178 lb 4 oz (80.9 kg)  08/29/21 178 lb 9 oz (81 kg)  07/18/21 177 lb 2 oz (80.3 kg)   BMI Readings from Last 3 Encounters:  09/26/21 30.60 kg/m  08/29/21 31.63 kg/m  07/18/21 31.38 kg/m    Assessment/Interventions: Review of patient past medical history, allergies, medications, health status, including review of consultants reports, laboratory and other test data, was performed as part of comprehensive evaluation and provision of chronic care management services.   SDOH:  (Social Determinants of Health) assessments and interventions performed: Yes   SDOH Screenings   Alcohol Screen: Low Risk  (04/20/2021)   Alcohol Screen    Last Alcohol Screening Score (AUDIT): 0   Depression (PHQ2-9): Low Risk  (08/29/2021)   Depression (PHQ2-9)    PHQ-2 Score: 0  Financial Resource Strain: Low Risk  (04/20/2021)   Overall Financial Resource Strain (CARDIA)    Difficulty of Paying Living Expenses: Not hard at all  Food Insecurity: No Food Insecurity (04/20/2021)   Hunger Vital Sign    Worried About Running Out of Food in the Last Year: Never true    Ran Out of Food in the Last Year: Never true  Housing: Low Risk  (04/20/2021)  Housing    Last Housing Risk Score: 0  Physical Activity: Sufficiently Active (04/20/2021)   Exercise Vital Sign    Days of Exercise per Week: 7 days    Minutes of Exercise per Session: 30 min  Social Connections: Socially Integrated (04/20/2021)   Social Connection and Isolation Panel [NHANES]    Frequency of Communication with Friends and Family: More than three times a week    Frequency of Social Gatherings with Friends and Family: More than three times a week    Attends Religious Services: More than 4 times per year    Active Member of [employer] or Organizations: Yes    Attends Archivist Meetings: More than 4 times per year    Marital Status: Married  Stress: No Stress Concern Present (04/20/2021)   [employer] [place]    Feeling of Stress : Not at all  Tobacco Use: Low Risk  (11/16/2021)   Patient History    Smoking Tobacco Use: Never    Smokeless Tobacco Use: Never    Passive Exposure: Not on file  Transportation Needs: No Transportation Needs (04/20/2021)   PRAPARE - Transportation    Lack of Transportation (Medical): No    Lack of Transportation (Non-Medical): No    CCM Care Plan  Allergies  Allergen Reactions   Cyclobenzaprine Hypertension   Cheese Other (See Comments)    bloating   Ciprofloxacin Hcl Other (See Comments)    Muscle pain   Coconut (Cocos Nucifera)     Upset stomach   Keflex [Cephalexin] Other (See Comments)    Patient prefers not to  take this medication due to the side effects, caused tendon issues    Medications Reviewed Today     Reviewed by [name], DPM (Physician) on 10/12/21 at 1024  Med List Status: <None>   Medication Order Taking? Sig Documenting Provider Last Dose Status Informant  acetaminophen (TYLENOL) 650 MG CR tablet [id number] No Take 1,300 mg by mouth at bedtime. [provider] Taking Active Self  ALFALFA PO [id number] No Take 1 tablet by mouth daily. [provider] Taking Active Self  amiodarone (PACERONE) 200 MG tablet [id number] No Take 0.5 tablets (100 mg total) by mouth daily. Rise Mu, [name] Taking Active   ascorbic Acid (VITAMIN C) 500 MG CPCR [id number] No Take 500 mg by mouth daily. [provider] Taking Active Self  B Complex-C (B-COMPLEX WITH VITAMIN C) tablet [id number] No Take 1 tablet by mouth 2 (two) times daily. [provider] Taking Active Self  BD PEN NEEDLE NANO U/F 32G X 4 MM MISC [id number] No USE 4 TIMES DAILY (HUMALOG 3 TIMES DAILY AND LANTUS ONCE DAILY) OFFICE NOTIFIED 08/06/17 Steele Sizer, MD Taking Active Self  BLACK ELDERBERRY,BERRY-FLOWER, PO [id number] No Take 15 mLs by mouth daily. Liquid [provider] Taking Active Self  Calcium Carbonate (CALCIUM 600 PO) [id number] No Take 600 mg by mouth daily. [provider] Taking Active Self  carvedilol (COREG) 3.125 MG tablet [id number] No Take 3.125 mg by mouth 2 (two) times daily. Rise Mu, [name] Taking Active   cholecalciferol (VITAMIN D) 1000 UNITS tablet [id number] No Take 1,000 Units by mouth daily. [place] [provider] Taking Active Self  clobetasol ointment (TEMOVATE) 0.05 % [id number] No APPLY 1 APPLICATION TOPICALLY 2 TIMES DAILY AS NEEDED (BUG BITES). Steele Sizer, MD Taking Active   Coenzyme Q10 (COQ10) 200 MG CAPS [id number] No Take 200 mg by mouth daily. [provider] Taking Active Self  Continuous Blood Gluc Sensor ([place]) MISC [id number] No by Does not apply route. [provider] Taking Active Self  Elastic Bandages & Supports (MEDICAL COMPRESSION STOCKINGS) [place] [id number] No 1 each by Does not apply route daily. Steele Sizer, MD Taking Active Self  ELIQUIS 5 MG TABS tablet [id number] No TAKE 1 TABLET BY MOUTH TWICE A DAY Gollan, Kathlene November, MD Taking Active   estradiol (ESTRACE VAGINAL) 0.1 MG/GM vaginal cream [id number] No 1/2 gm once weekly using applicator, apply blueberry sized amount of cream using tip of finger to urethra twice weekly Zara Council A, [name] Taking Active   Exenatide ER (BYDUREON BCISE) 2 MG/0.85ML AUIJ [id number] No Inject 2 mg into the skin every Sunday. [provider] Taking Active Self  furosemide (LASIX) 40 MG tablet [id number] No TAKE 1 TABLET BY MOUTH TWICE A DAY [name] A, FNP Taking Active   Glucosamine 500 MG TABS [id number] No Take 500 mg by mouth daily.  Patient not taking: Reported on 09/26/2021   [provider] Not Taking Active   HUMALOG KWIKPEN 100 UNIT/ML KiwkPen [id number] No Inject 4-12 Units into the skin 3 (three) times daily before meals. If needed sliding scale [provider] Taking Active   insulin glargine (LANTUS) 100 unit/mL SOPN [id number] No Inject 22 Units into the skin at bedtime. [provider] Taking Active Self  Krill Oil 1000 MG CAPS [id number] No Take 1,000 mg by mouth daily. [provider] Taking Active Self  Mag Aspart-Potassium Aspart (POTASSIUM & MAGNESIUM ASPARTAT PO) [id number] No Take 1 tablet by mouth daily. [provider] Taking Active Self  Patient not taking:  Discontinued 01/23/21 1515 Multiple Vitamins-Minerals (PRESERVISION AREDS 2) CAPS [id number] No Take 1 capsule by mouth 2 (two) times daily. [provider] Taking Active Self  OVER THE COUNTER MEDICATION [id number] No Place 1 application into both eyes See admin instructions. Blephadex eyelid foam, apply to  eyelids once daily [provider] Taking Active Self  OVER THE COUNTER MEDICATION [id number] No Take 1 tablet by mouth daily. Vita-Lea Gold otc supplement With out vitamin K ([place] products) [provider] Taking Active   OVER THE COUNTER MEDICATION [id number] No Take 1 tablet by mouth See admin instructions. Nutriferon otc supplement take Take 1 tablet on Sun, Tues, Thurs, Sat. Take 1 tablet twice daily on Mon, Wed, and Fri [provider] Taking Active Self  Probiotic Product (PROBIOTIC PO) [id number] No Take 1 capsule by mouth at bedtime. [provider] Taking Active Self  PROLIA 60 MG/ML SOSY injection [id number] No Inject 60 mg into the skin every 6 (six) months. [provider] Taking Active Self  Propylene Glycol (SYSTANE BALANCE OP) [id number] No Place 1 drop into both eyes daily as needed (dry eyes). [provider] Taking Active Self  rosuvastatin (CRESTOR) 10 MG tablet [id number] No Take 1 tablet (10 mg total) by mouth daily. Steele Sizer, MD Taking Active   sacubitril-valsartan [facility]) 49-51 MG [id number]  Take 1 tablet by mouth 2 (two) times daily. [name] A, FNP  Active   Sodium Chloride-Sodium Bicarb (NETI POT SINUS [place] NA) [id number] No Place 1 Dose into the nose at bedtime. [provider] Taking Active Self  tiZANidine (ZANAFLEX) 4 MG tablet [id number] No Take 1 tablet (4 mg total) by mouth at bedtime. Steele Sizer, MD Taking Active   Vitamin D, Ergocalciferol, (DRISDOL) 1.25 MG (50000 UT) CAPS capsule [id number] No Take 50,000 Units by  mouth every Saturday. [provider] Taking Active Self  Med List Note Lona Millard, RN 01/13/15 1037): No UDS needed No meds prescribed by [facility]            Patient Active Problem List   Diagnosis Date Noted   Pain due to onychomycosis of toenails of both feet 09/07/2021   Persistent atrial fibrillation ([place])    Hypertensive urgency    Chronic combined  systolic and diastolic congestive heart failure (HCC)    Elevated troponin    AF (paroxysmal atrial fibrillation) ([place]) 01/23/2021   Type 2 diabetes mellitus with hyperlipidemia ([place]) 01/23/2021   Hypertensive emergency 01/23/2021   Acute respiratory failure with hypoxia ([place]) 01/23/2021   Clavi 04/07/2020   Acute left-sided low back pain without sciatica 11/25/2019   Trochanteric bursitis of left hip 11/25/2019   Plantar fasciitis, bilateral 05/21/2019   Age-related osteoporosis without current pathological fracture 11/05/2017   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([place]) 07/09/2017   Atherosclerosis of abdominal aorta ([place]) 07/09/2017   Bilateral carotid bruits 08/24/2015   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   Atrial flutter ([place]) 02/22/2015   Chronic pain 01/10/2015   Type 2 diabetes, uncontrolled, 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 01/10/2015   Fatigue 01/10/2015   History of melanoma excision 01/10/2015   Morbid obesity ([place]) 01/10/2015   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   Spinal stenosis, lumbar region, 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   DDD (degenerative disc disease), lumbar 11/14/2014   Greater trochanteric bursitis of both hips 11/14/2014   Piriformis syndrome 11/14/2014   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   Hyperlipemia 11/07/2009   Hypertension, benign 11/07/2009   Atherosclerosis of renal artery ([place]) 11/07/2009   Hyperlipidemia due to type 2 diabetes mellitus ([place]) 11/07/2009   Perennial allergic rhinitis 11/27/2007   Lumbosacral neuritis 01/17/2007    Immunization History  Administered Date(s) Administered   Fluad Quad(high Dose 65+) 03/19/2019, 04/20/2020   Influenza, High Dose Seasonal PF 03/10/2015, 03/02/2016, 03/12/2017, 04/07/2018, 04/13/2021   Moderna Sars-Covid-2 Vaccination 07/14/2019, 08/11/2019, 05/17/2020, 11/17/2020, 03/17/2021   Pneumococcal Conjugate-13 07/13/2013   Pneumococcal Polysaccharide-23  03/31/2010   Tdap 03/07/2012   Zoster Recombinat (Shingrix) 02/02/2017, 04/05/2017   Zoster, Live 06/01/2010    Conditions to be addressed/monitored:  Hypertension, Hyperlipidemia, Diabetes, Atrial Fibrillation, Heart Failure, Coronary Artery Disease, Osteoporosis, Allergic Rhinitis, and Chronic Pain  There are no care plans that you recently modified to display for this patient.      Medication Assistance: None required.  Patient affirms current coverage meets needs.  Compliance/Adherence/Medication fill history: Care Gaps: COVID-19 Vaccine Influenza vaccine   Star-Rating Drugs: Rosuvastatin 10 mg last filled on 01/16/2021 for a 90-Day supply with [facility] Losartan 25 mg last filled on 01/31/2021 for a 30-Day supply with [facility]  Patient's [facility]  [id number] [place]Phone: [phone]: [phone] [place] [address]Phone: [phone]: [phone]  Uses pill box? Yes Pt endorses 100% compliance  We discussed: Current pharmacy is preferred with insurance plan and patient is satisfied with pharmacy services Patient decided to: Continue current medication management strategy  Care Plan and Follow Up Patient Decision:  Patient agrees to Care Plan and Follow-up.  Plan: Telephone follow up appointment with care management team member scheduled for:  05/17/2021 at 3:00 PM  ADoristine Section  BCACP, [facility] [phone]  Current Barriers:  No barriers noted  Pharmacist Clinical Goal(s):  Patient will maintain control of diabetes as evidenced by A1c less than 8%  maintain control of heart failure as evidenced by stable weight, fluid status through collaboration with PharmD and provider.    Interventions: 1:1 collaboration with Steele Sizer, MD regarding development and update of comprehensive plan of care as evidenced by provider attestation and co-signature Inter-disciplinary care team collaboration (see longitudinal plan of care) Comprehensive medication review performed; medication list updated in electronic medical record  Diabetes (A1c goal <8%) -Controlled -Managed by Dr. Honor Junes  -Current medications: Byduren BCISE 2 mg weekly Humalog 4-12 units three times daily (has not needed) Lantus 22 units nightly (15 nightly)  -Medications previously tried: NA  -Current home glucose readings: Utilizing freestyle libre system -Denies hypoglycemic/hyperglycemic symptoms -Recommended to continue current medication  Heart Failure (Goal: manage symptoms and prevent exacerbations) -Controlled -Last ejection fraction: 45-50% (Date: 01/23/21) -HF type: Combined Systolic and Diastolic -NYHA Class: II (slight limitation of activity) -AHA HF Stage: C (Heart disease and symptoms present) -Current treatment: Carvedilol 3.125 mg twice daily  Entresto 49-51 mg twice daily  Furosemide 40 mg twice daily   -Medications previously tried: Metoprolol (Bradycardia), irbesartan, HCTZ, Telmisartan -Current home BP/HR readings: Has not been able to monitor recently  -Patient weighs daily, reports weight has been stable between 174-178  -Recommended to continue current medication  Atrial Fibrillation (Goal: prevent stroke and major bleeding) -Controlled -CHADSVASC: 6 -Current treatment: Rate control: Amiodarone 200 mg 1/2 tablet daily Anticoagulation: Eliquis 5 mg twice daily  -Medications previously tried: Flecainide,  -Patient reports she has maintained sinus rhythm -Continue current medications  Hyperlipidemia: (LDL goal < 70) -Controlled -Current treatment: Rosuvastatin 10 mg daily  -Medications previously tried: NA  -Previously had extra supply, has caught up now.   -Recommended to continue current medication  Osteoporosis (Goal Prevent bone fractures) -Controlled -Patient is a candidate for pharmacologic treatment due to T-Score < -2.5 in femoral neck -Current treatment  Ergocalciferol 50,000 units weekly Vitamin D3 1000 units daily Prolia 60 mg into the skin every 6 months (next shot due September) -Medications previously tried: NA  -Recommend stopping vitamin D3 for now. Could consider stopping ergocalciferol at this point, will defer until upcoming endocrinology appointment -Recommended to continue current medication  Patient Goals/Self-Care Activities Patient will:  - check glucose 3-4 times daily, document, and provide at future appointments check blood pressure weekly, document, and provide at future appointments weigh daily, and contact provider if weight gain of greater than 2 pounds in 24 hours  Follow Up Plan: ***

## 2021-12-21 NOTE — Patient Instructions (Signed)
Visit Information It was great speaking with you today!  Please let me know if you have any questions about our visit.   Goals Addressed             This Visit's Progress    Monitor and Manage My Blood Sugar-Diabetes Type 2   On track    Timeframe:  Long-Range Goal Priority:  High Start Date: 02/09/2021                             Expected End Date:  02/10/2023                     Follow Up within 90 days    - check blood sugar at prescribed times - check blood sugar before and after exercise - check blood sugar if I feel it is too high or too low - take the blood sugar meter to all doctor visits    Why is this important?   Checking your blood sugar at home helps to keep it from getting very high or very low.  Writing the results in a diary or log helps the doctor know how to care for you.  Your blood sugar log should have the time, date and the results.  Also, write down the amount of insulin or other medicine that you take.  Other information, like what you ate, exercise done and how you were feeling, will also be helpful.     Notes:         Patient Care Plan: General Pharmacy (Adult)     Problem Identified: Hypertension, Hyperlipidemia, Diabetes, Atrial Fibrillation, Heart Failure, Coronary Artery Disease, Osteoporosis, Allergic Rhinitis, and Chronic Pain   Priority: High     Long-Range Goal: Patient-Specific Goal   Start Date: 02/10/2021  Expected End Date: 02/10/2022  This Visit's Progress: On track  Recent Progress: On track  Priority: High  Note:   Current Barriers:  No barriers noted  Pharmacist Clinical Goal(s):  Patient will maintain control of diabetes as evidenced by A1c less than 8%  maintain control of heart failure as evidenced by stable weight, fluid status through collaboration with PharmD and provider.   Interventions: 1:1 collaboration with Steele Sizer, MD regarding development and update of comprehensive plan of care as evidenced by  provider attestation and co-signature Inter-disciplinary care team collaboration (see longitudinal plan of care) Comprehensive medication review performed; medication list updated in electronic medical record  Diabetes (A1c goal <8%) -Controlled -Managed by Dr. Honor Junes  -Current medications: Byduren BCISE 2 mg weekly Humalog 4-12 units three times daily (has not needed) Lantus 22 units nightly (15 nightly)  -Medications previously tried: NA  -Current home glucose readings: Utilizing freestyle libre system -Denies hypoglycemic/hyperglycemic symptoms -Discussed potential addition of SGLT2 inhibitor for diabetes control and benefits in heart failure. Patient was given extensive information regarding the mechanism, side effect profile, and potential cost of this medication. Patient was concerned about the potential effects on her bone density, risk of UTIs, and cost since she likely would not qualify for patient assistance. However she stated she would think over our discussion and discuss with cardiology + endocrinology as well.  -Recommended to continue current medication  Heart Failure (Goal: manage symptoms and prevent exacerbations) -Controlled -Last ejection fraction: 45-50% (Date: 01/23/21) -HF type: Combined Systolic and Diastolic -NYHA Class: II (slight limitation of activity) -AHA HF Stage: C (Heart disease and symptoms present) -Current treatment: Carvedilol 3.125 mg  twice daily  Entresto 49-51 mg twice daily  Furosemide 40 mg twice daily   -Medications previously tried: Metoprolol (Bradycardia), irbesartan, HCTZ, Telmisartan -Current home BP/HR readings: Has not been able to monitor recently  -Patient weighs daily, reports weight has been stable between 174-178  -Recommended to continue current medication  Atrial Fibrillation (Goal: prevent stroke and major bleeding) -Controlled -CHADSVASC: 6 -Current treatment: Rate control: Amiodarone 200 mg 1/2 tablet  daily Anticoagulation: Eliquis 5 mg twice daily  -Medications previously tried: Flecainide,  -Patient reports she has maintained sinus rhythm -Continue current medications  Hyperlipidemia: (LDL goal < 70) -Controlled -Current treatment: Rosuvastatin 10 mg daily  -Medications previously tried: NA  -Recommended to continue current medication  Osteoporosis (Goal Prevent bone fractures) -Controlled -Patient is a candidate for pharmacologic treatment due to T-Score < -2.5 in femoral neck -Current treatment  Ergocalciferol 50,000 units weekly Vitamin D3 1000 units daily Prolia 60 mg into the skin every 6 months (next shot due September) -Medications previously tried: NA  -Recommended to continue current medication  Patient Goals/Self-Care Activities Patient will:  - check glucose 3-4 times daily, document, and provide at future appointments check blood pressure weekly, document, and provide at future appointments weigh daily, and contact provider if weight gain of greater than 2 pounds in 24 hours  Follow Up Plan: Telephone follow up appointment with care management team member scheduled for:  03/21/2022 at 2:00 PM    Patient agreed to services and verbal consent obtained.   Patient verbalizes understanding of instructions and care plan provided today and agrees to view in [place]. Active MyChart status and patient understanding of how to access instructions and care plan via MyChart confirmed with patient.     Malva Limes, [place] Pharmacist Practitioner  [facility] [phone]

## 2021-12-26 NOTE — Progress Notes (Signed)
Patient ID: Bonnie Rowland, female    DOB: May 16, 1941, 81 y.o.   MRN: [id number]  Bonnie Rowland is a 81 y/o female with a history of HTN, DM, hyperlipidemia, atrial fibrillation, GERD and chronic heart failure.   Echo report from 03/31/21 reviewed and showed an EF of  60-65% without structural changes. Echo report from 01/23/21 reviewed and showed an EF of 45-50% with mild LVH, severe hypokinesis of left ventricle and trivial MR.   RHC/LHC done 01/25/21 and showed: Mild to moderate, non-obstructive coronary artery disease, including 30% mid LAD disease, 50-60% mid/distal LCx stenosis, and 60% proximal/mid RCA lesion. Mildly to moderately reduced left ventricular systolic function with mid anterior hypo/akinesis; query atypical Takotsubo variant.  LVEF ~45%. Mildly elevated left heart filling pressures. Moderately elevated right heart filling and pulmonary artery pressures with significant pulmonary vascular resistance. Mildly reduced Fick cardiac output/index.  Has not been admitted or been in the ED in the last 6 months.   She presents today for a follow-up visit with a chief complaint of minimal fatigue upon moderate exertion. Describes this as chronic in nature. She has associated pedal edema along with this. She denies any difficulty sleeping, dizziness, abdominal distention, palpitations, chest pain, shortness of breath, cough or weight gain.   Husband had shoulder surgery ~ 1 month ago and she had gotten out of the habit of walking because of taking care of him. She started walking this morning and walked for 1/2 hour and says that she really enjoyed it. Has continued to do her tai chi and previous PT exercises that she was taught.   There's been discussion of adding SGLT2 but she's hesitant because she already takes so many medications.   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; left shoulder blade   Cancer ([place]) 05/2014   basal cell removed left temple   Cataract 2003   Cellulitis and abscess of leg, except foot    Conjunctivitis unspecified    Dermatophytosis of nail    Diabetes mellitus    type II   Esophageal reflux    Hyperlipidemia    Hypertension    Osteoporosis 2016   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)    Renal artery stenosis (HCC)    Sprain of lumbar region    Thoracic or lumbosacral neuritis or radiculitis, unspecified    Urinary tract infection, site not specified    Past Surgical History:  Procedure Laterality Date   APPENDECTOMY     CARDIAC CATHETERIZATION     CARDIOVERSION N/A 11/30/2020   Procedure: CARDIOVERSION;  Surgeon: Minna Merritts, MD;  Location: [facility] ORS;  Service: Cardiovascular;  Laterality: N/A;   CARDIOVERSION N/A 01/20/2021   Procedure: CARDIOVERSION;  Surgeon: Minna Merritts, MD;  Location: [place] [facility];  Service: Cardiovascular;  Laterality: N/A;   CARDIOVERSION N/A 01/30/2021   Procedure: CARDIOVERSION;  Surgeon: Wellington Hampshire, MD;  Location: [facility] OR[facility];  Service: Cardiovascular;  Laterality: N/A;   cataract surgery     COLONOSCOPY     COLONOSCOPY WITH PROPOFOL N/A 05/09/2016   Procedure: COLONOSCOPY WITH PROPOFOL;  Surgeon: Robert Bellow, MD;  Location: [facility];  Service: Endoscopy;  Laterality: N/A;   DIAGNOSTIC MAMMOGRAM     EYE SURGERY Bilateral    Cataract Extraction   JOINT REPLACEMENT  2016   KNEE ARTHROPLASTY Right 03/30/2015   Procedure: COMPUTER ASSISTED TOTAL KNEE ARTHROPLASTY;  Surgeon: Dereck Leep, MD;  Location: [facility] OR[facility];  Service: Orthopedics;  Laterality: Right;   KNEE ARTHROSCOPY Right    KNEE CLOSED REDUCTION Right 05/23/2015   Procedure: CLOSED MANIPULATION KNEE;   Surgeon: Dereck Leep, MD;  Location: [facility];  Service: Orthopedics;  Laterality: Right;   RIGHT/LEFT HEART CATH AND CORONARY ANGIOGRAPHY N/A 01/25/2021   Procedure: RIGHT/LEFT HEART CATH AND CORONARY ANGIOGRAPHY;  Surgeon: Nelva Bush, MD;  Location: [place] [facility] [facility];  Service: Cardiovascular;  Laterality: N/A;   TONSILLECTOMY     Family History  Problem Relation Age of Onset   Diabetes Mother    Hypertension Mother    Cancer Mother    Kidney disease Mother    Hypertension Father    Cancer Father        Prostate   Breast cancer Maternal Aunt 67   Colon cancer Son    Kidney cancer Neg Hx    Bladder Cancer Neg Hx    Social History   Tobacco Use   Smoking status: Never   Smokeless tobacco: Never   Tobacco comments:    Smoking cessation materials not required  Substance Use Topics   Alcohol use: Not Currently    Alcohol/week: 1.0 standard drink of alcohol    Types: 1 Glasses of wine per week   Allergies  Allergen Reactions   Cyclobenzaprine Hypertension   Cheese Other (See Comments)    bloating   Ciprofloxacin Hcl Other (See Comments)    Muscle pain   Coconut (Cocos Nucifera)     Upset stomach   Keflex [Cephalexin] Other (See Comments)    Patient prefers not to take this medication due to the side effects, caused tendon issues   Prior to Admission medications   Medication Sig Start Date End Date Taking? Authorizing Provider  acetaminophen (TYLENOL) 650 MG CR tablet Take 1,300 mg by mouth at bedtime.   Yes [provider]  ALFALFA PO Take 1 tablet by mouth daily.   Yes [provider]  amiodarone (PACERONE) 200 MG tablet Take 0.5 tablets (100 mg total) by mouth daily. 04/12/21  Yes Dunn, Areta Haber, [name]  ascorbic Acid (VITAMIN C) 500 MG CPCR Take 500 mg by mouth daily.   Yes [provider]  B Complex-C (B-COMPLEX WITH VITAMIN C) tablet Take 1 tablet by mouth 2 (two) times daily.   Yes [provider]  BD PEN NEEDLE NANO U/F  32G X 4 MM MISC USE 4 TIMES DAILY (HUMALOG 3 TIMES DAILY AND LANTUS ONCE DAILY) OFFICE NOTIFIED 08/06/17 12/07/18  Yes Sowles, Drue Stager, MD  BLACK ELDERBERRY,BERRY-FLOWER, PO Take 15 mLs by mouth daily. Liquid   Yes [provider]  Calcium Carbonate (CALCIUM 600 PO) Take 600 mg by mouth daily.   Yes [provider]  carvedilol (COREG) 3.125 MG tablet Take 3.125 mg by mouth 2 (two) times daily.   Yes Rise Mu, [name]  cholecalciferol (VITAMIN D) 1000 UNITS tablet Take 1,000 Units by mouth daily. [place]   Yes [provider]  clobetasol ointment (TEMOVATE) 3.79 % APPLY 1 APPLICATION TOPICALLY 2 TIMES DAILY AS NEEDED (BUG BITES). 12/05/21  Yes Myles Gip, DO  Coenzyme Q10 (COQ10) 200 MG CAPS Take 200  mg by mouth daily.   Yes [provider]  Continuous Blood Gluc Sensor ([place]) MISC by Does not apply route.   Yes [provider]  Elastic Bandages & Supports (MEDICAL COMPRESSION STOCKINGS) [place] 1 each by Does not apply route daily. 01/08/19  Yes Sowles, Drue Stager, MD  ELIQUIS 5 MG TABS tablet TAKE 1 TABLET BY MOUTH TWICE A DAY 08/28/21  Yes Gollan, Kathlene November, MD  estradiol (ESTRACE VAGINAL) 0.1 MG/GM vaginal cream 1/2 gm once weekly using applicator, apply blueberry sized amount of cream using tip of finger to urethra twice weekly 07/06/21  Yes McGowan, Larene Beach A, [name]  Exenatide ER (BYDUREON BCISE) 2 MG/0.85ML AUIJ Inject 2 mg into the skin every Sunday. 02/26/20  Yes [provider]  furosemide (LASIX) 40 MG tablet TAKE 1 TABLET BY MOUTH TWICE A DAY 08/06/21  Yes Tyreesha Maharaj A, FNP  HUMALOG KWIKPEN 100 UNIT/ML KiwkPen Inject 5-10 Units into the skin 3 (three) times daily before meals. If needed sliding scale 10/15/17  Yes [provider]  insulin glargine (LANTUS) 100 unit/mL SOPN Inject 16-22 Units into the skin at bedtime.   Yes [provider]  Javier Docker Oil 1000 MG CAPS Take 1,000 mg by mouth daily.   Yes  [provider]  Mag Aspart-Potassium Aspart (POTASSIUM & MAGNESIUM ASPARTAT PO) Take 1 tablet by mouth daily.   Yes [provider]  Multiple Vitamins-Minerals (PRESERVISION AREDS 2) CAPS Take 1 capsule by mouth 2 (two) times daily.   Yes [provider]  OVER THE COUNTER MEDICATION Place 1 application into both eyes See admin instructions. Blephadex eyelid foam, apply to eyelids once daily   Yes [provider]  OVER THE COUNTER MEDICATION Take 1 tablet by mouth daily. Vita-Lea Gold otc supplement With out vitamin K ([place] products)   Yes [provider]  OVER THE COUNTER MEDICATION Take 1 tablet by mouth See admin instructions. Nutriferon otc supplement take Take 1 tablet on Sun, Tues, Thurs, Sat. Take 1 tablet twice daily on Mon, Wed, and Fri   Yes [provider]  Probiotic Product (PROBIOTIC PO) Take 1 capsule by mouth at bedtime.   Yes [provider]  PROLIA 60 MG/ML SOSY injection Inject 60 mg into the skin every 6 (six) months. 09/02/19  Yes [provider]  Propylene Glycol (SYSTANE BALANCE OP) Place 1 drop into both eyes daily as needed (dry eyes).   Yes [provider]  rosuvastatin (CRESTOR) 10 MG tablet TAKE 1 TABLET (10 MG TOTAL) BY MOUTH 4 (FOUR) TIMES A WEEK. 10/20/21  Yes Sowles, Drue Stager, MD  sacubitril-valsartan (ENTRESTO) 49-51 MG Take 1 tablet by mouth 2 (two) times daily. 10/11/21  Yes Summers Buendia, Otila Kluver A, FNP  Sodium Chloride-Sodium Bicarb (NETI POT SINUS WASH NA) Place 1 Dose into the nose at bedtime.   Yes [provider]  tiZANidine (ZANAFLEX) 2 MG tablet Take 1-2 tablets (2-4 mg total) by mouth at bedtime. 10/27/21  Yes Sowles, Drue Stager, MD  Vitamin D, Ergocalciferol, (DRISDOL) 1.25 MG (50000 UT) CAPS capsule Take 50,000 Units by mouth every Saturday. 11/23/18  Yes [provider]  Glucosamine 500 MG TABS Take 500 mg by mouth daily. Patient not taking: Reported on 09/26/2021     [provider]   Review of Systems  Constitutional:  Positive for fatigue. Negative for appetite change.  HENT:  Negative for congestion, postnasal drip and sore throat.   Eyes: Negative.   Respiratory:  Negative for cough, chest  tightness and shortness of breath.   Cardiovascular:  Positive for leg swelling. Negative for chest pain and palpitations.  Gastrointestinal:  Negative for abdominal distention and abdominal pain.  Endocrine: Negative.   Genitourinary: Negative.   Musculoskeletal:  Negative for arthralgias and back pain.  Skin: Negative.   Allergic/Immunologic: Negative.   Neurological:  Negative for dizziness and light-headedness.  Hematological:  Negative for adenopathy. Does not bruise/bleed easily.  Psychiatric/Behavioral:  Negative for dysphoric mood and sleep disturbance (sleeping in recliner). The patient is not nervous/anxious.    Vitals:   12/27/21 0953  BP: (!) 150/44  Pulse: 61  Resp: 18  SpO2: 98%  Weight: 177 lb 2 oz (80.3 kg)  Height: _0  (1.651 m)   Wt Readings from Last 3 Encounters:  12/27/21 177 lb 2 oz (80.3 kg)  09/26/21 178 lb 4 oz (80.9 kg)  08/29/21 178 lb 9 oz (81 kg)   Lab Results  Component Value Date   CREATININE 0.85 07/12/2021   CREATININE 0.80 04/12/2021   CREATININE 0.83 03/13/2021   Physical Exam Vitals and nursing note reviewed.  Constitutional:      General: She is not in acute distress.    Appearance: Normal appearance.  HENT:     Head: Normocephalic and atraumatic.  Cardiovascular:     Rate and Rhythm: Normal rate and regular rhythm.  Pulmonary:     Effort: Pulmonary effort is normal. No respiratory distress.     Breath sounds: No wheezing or rales.  Abdominal:     General: There is no distension.     Palpations: Abdomen is soft.  Musculoskeletal:        General: No tenderness.     Cervical back: Normal range of motion and neck supple.     Right lower leg: Edema (1+ pitting) present.     Left lower leg:  Edema (trace) present.  Skin:    General: Skin is warm and dry.  Neurological:     General: No focal deficit present.     Mental Status: She is alert and oriented to person, place, and time.  Psychiatric:        Mood and Affect: Mood normal.        Behavior: Behavior normal.        Thought Content: Thought content normal.    Assessment & Plan:  1: Chronic heart failure with preserved ejection fraction- - NYHA class II - euvolemic today - weighing daily; reminded to call for an overnight weight gain of > 2 pounds or a weekly weight gain of > 5 pounds.  - weigh down 1 pound from her last visit here 3 months ago - has resumed her walking just this morning; she walked for 1/2 hour and said she did fine while walking - not adding salt and has been reading food labels - GDMT of entresto 49/51 - discussed SGLT2 and possible benefits/ risks; she is concerned about cost and explained that it will probably be similar in cost to entresto and since she didn't get approved for patient assistance for entresto, she may not get approved for an SGLT2 - she has had some yeast infections in the past but none recently - she defers in starting it today but will think about it and we can discuss more at next visit; if we start it, will decrease her furosemide from BID to daily and maybe even PRN - wearing compression socks daily and removes HS - saw Gollan 07/18/21 - BNP 01/22/21 was  237.3 - PharmD reconciled medications with the patient  2: HTN- - BP elevated (150/44) but she forgot to take her carvedilol this morning; she will take it when she gets home - saw PCP Ancil Boozer) 07/12/20; returns 01/09/22 - BMP 09/13/21 reviewed and showed sodium 147, potassium 4.2, creatinine 0.8  & GFR 70  3: DM- - A1c 09/21/21 was 6.7% - saw endocrinology Honor Junes) 09/21/21 - nonfasting glucose at home this morning was 170  4: Paroxysmal atrial fibrillation- - successfully cardioverted 01/30/21 - saw cardiology Rockey Situ)  07/18/21; returns 12 months - RRR in office today    Medication bottles reviewed.   Return in 4 months, sooner if needed.

## 2021-12-27 ENCOUNTER — Encounter: Payer: Self-pay | Admitting: Family

## 2021-12-27 ENCOUNTER — Ambulatory Visit: Payer: Medicare Other | Attending: Family | Admitting: Family

## 2021-12-27 ENCOUNTER — Other Ambulatory Visit: Payer: Self-pay | Admitting: Family Medicine

## 2021-12-27 ENCOUNTER — Encounter: Payer: Self-pay | Admitting: Pharmacist

## 2021-12-27 VITALS — BP 150/44 | HR 61 | Resp 18 | Ht 65.0 in | Wt 177.1 lb

## 2021-12-27 DIAGNOSIS — E1136 Type 2 diabetes mellitus with diabetic cataract: Secondary | ICD-10-CM | POA: Diagnosis not present

## 2021-12-27 DIAGNOSIS — I10 Essential (primary) hypertension: Secondary | ICD-10-CM

## 2021-12-27 DIAGNOSIS — I480 Paroxysmal atrial fibrillation: Secondary | ICD-10-CM | POA: Diagnosis not present

## 2021-12-27 DIAGNOSIS — I5032 Chronic diastolic (congestive) heart failure: Secondary | ICD-10-CM | POA: Diagnosis not present

## 2021-12-27 DIAGNOSIS — E1169 Type 2 diabetes mellitus with other specified complication: Secondary | ICD-10-CM | POA: Diagnosis not present

## 2021-12-27 DIAGNOSIS — I110 Hypertensive heart disease with heart failure: Secondary | ICD-10-CM | POA: Insufficient documentation

## 2021-12-27 DIAGNOSIS — E785 Hyperlipidemia, unspecified: Secondary | ICD-10-CM | POA: Diagnosis not present

## 2021-12-27 DIAGNOSIS — M62830 Muscle spasm of back: Secondary | ICD-10-CM

## 2021-12-27 DIAGNOSIS — I701 Atherosclerosis of renal artery: Secondary | ICD-10-CM

## 2021-12-27 DIAGNOSIS — K219 Gastro-esophageal reflux disease without esophagitis: Secondary | ICD-10-CM | POA: Diagnosis not present

## 2021-12-27 NOTE — Progress Notes (Signed)
[place] - PHARMACIST COUNSELING NOTE  Guideline-Directed Medical Therapy/Evidence Based Medicine  ACE/ARB/ARNI: Sacubitril-valsartan 49-51 mg twice daily Beta Blocker: Carvedilol 3.125 mg twice daily Aldosterone Antagonist:  none Diuretic: Furosemide 40 mg twice daily SGLT2i:  none  Adherence Assessment  Do you ever forget to take your medication? '[]'$ Yes '[x]'$ No  Do you ever skip doses due to side effects? '[]'$ Yes '[x]'$ No  Do you have trouble affording your medicines? '[]'$ Yes '[x]'$ No  Are you ever unable to pick up your medication due to transportation difficulties? '[]'$ Yes '[x]'$ No  Do you ever stop taking your medications because you don't believe they are helping? '[]'$ Yes '[x]'$ No  Do you check your weight daily? '[x]'$ Yes '[]'$ No   Adherence strategy: pill box (different colors for night and day)  Barriers to obtaining medications: none  Vital signs: HR 56, BP 150/44, weight (pounds) 177 lbs ECHO:  Date 01/23/21, EF 45-50%, notes mild LVH, severe hypokinesis of left ventricle and trivial MR.   Date 03/31/21, EF 60-65%     Latest Ref Rng & Units 07/12/2021    8:44 AM 04/12/2021    1:54 PM 03/13/2021   10:42 AM  BMP  Glucose 65 - 99 mg/dL 90  132  171   BUN 7 - 25 mg/dL '14  14  16   '$ Creatinine 0.60 - 1.00 mg/dL 0.85  0.80  0.83   BUN/Creat Ratio 6 - 22 (calc) NOT APPLICABLE  18    Sodium 135 - 146 mmol/L 141  139  139   Potassium 3.5 - 5.3 mmol/L 4.2  4.5  3.7   Chloride 98 - 110 mmol/L 102  98  102   CO2 20 - 32 mmol/L 33  23  27   Calcium 8.6 - 10.4 mg/dL 9.3  9.7  8.9     Past Medical History:  Diagnosis Date   Acute cystitis    Acute on chronic combined systolic and diastolic CHF (congestive heart failure) (HCC)    Allergic rhinitis, cause unspecified    Arthritis 2015   Atherosclerosis of renal artery (HCC)    left   Atrial fibrillation (HCC)    Bronchitis, not specified as acute or chronic    Cancer ([place]) 12/2013   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 PMH includes A.Fib, hx of cancer, DM, HLD, HTN, osteoporosis, renal artery stenosis, and chronic HF. Noted no ED visit in the last 6 months and last BMET 5 months ago.   Patient denies dizziness, swelling, increase SOB , or any adverse reaction to medication. Noted minor hair loss, and is working on increasing physical activity.  PLAN (Recommendations)  No changes today Repeat BMET in July (at PCP) Consider start SGLT2i is renal function and hydration status stable.  Time spent: 15 minutes  Pritesh Sobecki Rodriguez-Guzman  PharmD, BCPS 12/27/2021 10:17 AM   Current Outpatient Medications:    acetaminophen (TYLENOL) 650 MG CR tablet, Take 1,300 mg by mouth at bedtime., Disp: , Rfl:    ALFALFA PO, Take 1 tablet by mouth daily., Disp: , Rfl:    amiodarone (PACERONE) 200 MG tablet, Take 0.5 tablets (100 mg total) by mouth daily., Disp: 90 tablet, Rfl: 3   ascorbic Acid (VITAMIN C) 500 MG CPCR, Take 500 mg by mouth daily., Disp: , Rfl:    B Complex-C (B-COMPLEX WITH VITAMIN C) tablet, Take 1 tablet by mouth 2 (two) times daily., Disp: , Rfl:    BD PEN NEEDLE NANO U/F 32G X 4 MM MISC, USE 4 TIMES DAILY (HUMALOG 3 TIMES DAILY AND LANTUS ONCE DAILY) OFFICE NOTIFIED 08/06/17, Disp: 90 each, Rfl: 1   BLACK ELDERBERRY,BERRY-FLOWER, PO, Take 15 mLs by mouth daily. Liquid, Disp: , Rfl:    Calcium Carbonate (CALCIUM 600  PO), Take 600 mg by mouth daily., Disp: , Rfl:    carvedilol (COREG) 3.125 MG tablet, Take 3.125 mg by mouth 2 (two) times daily., Disp: , Rfl:    cholecalciferol (VITAMIN D) 1000 UNITS tablet, Take 1,000 Units by mouth daily. [place]lee, Disp: , Rfl:    clobetasol ointment (TEMOVATE) 4.08 %, APPLY 1 APPLICATION TOPICALLY 2 TIMES DAILY AS NEEDED (BUG BITES)., Disp: 30 g, Rfl: 0   Coenzyme Q10 (COQ10) 200 MG CAPS, Take 200 mg by mouth daily., Disp: , Rfl:    Continuous Blood Gluc Sensor ([place]) MISC, by Does not apply route., Disp: , Rfl:    Elastic Bandages & Supports ([place]) MISC, 1 each by Does not apply route daily., Disp: 2 each, Rfl: 5   ELIQUIS 5 MG TABS tablet, TAKE 1 TABLET BY MOUTH TWICE A DAY, Disp: 180 tablet, Rfl: 1   estradiol (ESTRACE VAGINAL) 0.1 MG/GM vaginal cream, 1/2 gm once weekly using applicator, apply blueberry sized amount of cream using tip of finger to urethra twice weekly, Disp: 30 g, Rfl: 3   Exenatide ER (BYDUREON BCISE) 2 MG/0.85ML AUIJ, Inject 2 mg into the skin every Sunday., Disp: , Rfl:    furosemide (LASIX) 40 MG tablet, TAKE 1 TABLET BY MOUTH TWICE A DAY, Disp: 180 tablet, Rfl: 3   Glucosamine 500 MG TABS, Take 500 mg by mouth daily. (Patient not taking: Reported on 09/26/2021), Disp: , Rfl:    HUMALOG KWIKPEN 100 UNIT/ML KiwkPen, Inject 5-10 Units into the skin 3 (three) times daily before meals. If needed sliding scale, Disp: , Rfl: 3   insulin glargine (LANTUS) 100 unit/mL SOPN, Inject 16-22 Units into the skin at bedtime., Disp: , Rfl:    Krill Oil 1000 MG CAPS, Take 1,000 mg by mouth daily., Disp: , Rfl:    Mag Aspart-Potassium Aspart (POTASSIUM & MAGNESIUM ASPARTAT PO), Take 1 tablet by mouth daily., Disp: , Rfl:    Multiple Vitamins-Minerals (PRESERVISION AREDS 2) CAPS, Take 1 capsule by mouth 2 (two) times daily., Disp: , Rfl:    OVER THE COUNTER MEDICATION, Place 1 application into both eyes See admin  instructions. Blephadex eyelid foam, apply to eyelids once daily, Disp: , Rfl:    OVER THE COUNTER MEDICATION, Take 1 tablet by mouth daily. Vita-Lea Gold otc supplement With out vitamin K ([place]lee products), Disp: , Rfl:    OVER THE COUNTER MEDICATION, Take 1 tablet by mouth See admin instructions. Nutriferon otc supplement take Take 1 tablet on Sun, Tues, Thurs, Sat. Take 1 tablet  twice daily on Mon, Wed, and Fri, Disp: , Rfl:    Probiotic Product (PROBIOTIC PO), Take 1 capsule by mouth at bedtime., Disp: , Rfl:    PROLIA 60 MG/ML SOSY injection, Inject 60 mg into the skin every 6 (six) months., Disp: , Rfl:    Propylene Glycol (SYSTANE BALANCE OP), Place 1 drop into both eyes daily as needed (dry eyes)., Disp: , Rfl:    rosuvastatin (CRESTOR) 10 MG tablet, TAKE 1 TABLET (10 MG TOTAL) BY MOUTH 4 (FOUR) TIMES A WEEK., Disp: 48 tablet, Rfl: 1   sacubitril-valsartan (ENTRESTO) 49-51 MG, Take 1 tablet by mouth 2 (two) times daily., Disp: 180 tablet, Rfl: 3   Sodium Chloride-Sodium Bicarb (NETI POT SINUS WASH NA), Place 1 Dose into the nose at bedtime., Disp: , Rfl:    tiZANidine (ZANAFLEX) 2 MG tablet, Take 1-2 tablets (2-4 mg total) by mouth at bedtime., Disp: 110 tablet, Rfl: 0   Vitamin D, Ergocalciferol, (DRISDOL) 1.25 MG (50000 UT) CAPS capsule, Take 50,000 Units by mouth every Saturday., Disp: , Rfl:

## 2021-12-27 NOTE — Patient Instructions (Signed)
Continue weighing daily and call for an overnight weight gain of 3 pounds or more or a weekly weight gain of more than 5 pounds  If you have voicemail, please make sure your mailbox is cleaned out so that we may leave a message and please make sure to listen to any voicemails.    If you receive a satisfaction survey regarding the [facility], please take the time to fill it out. This way we can continue to provide excellent care and make any changes that need to be made.     

## 2021-12-27 NOTE — Telephone Encounter (Signed)
Requested medication (s) are due for refill today: yes  Requested medication (s) are on the active medication list: yes  Last refill:  10/27/21  Future visit scheduled: yes  Notes to clinic:  Unable to refill per protocol, cannot delegate.      Requested Prescriptions  Pending Prescriptions Disp Refills   tiZANidine (ZANAFLEX) 2 MG tablet [Pharmacy Med Name: TIZANIDINE HCL 2 MG TABLET] 110 tablet 0    Sig: Take 1-2 tablets (2-4 mg total) by mouth at bedtime.     Not Delegated - Cardiovascular:  Alpha-2 Agonists - tizanidine Failed - 12/27/2021  1:05 PM      Failed - This refill cannot be delegated      Passed - Valid encounter within last 6 months    Recent Outpatient Visits           5 months ago Type 2 diabetes mellitus with other specified complication, with long-term current use of insulin [facility])   [facility] Steele Sizer, MD   10 months ago Hospital discharge follow-up   [facility] Steele Sizer, MD   11 months ago 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[facility])   [facility] Steele Sizer, MD   1 year ago Diabetes mellitus type 2 in obese [facility])   [facility] Steele Sizer, MD   1 year ago [facility] Steele Sizer, MD       Future Appointments             In 1 week Steele Sizer, MD [facility], [place]   In 3 months  [facility], [place]   In 6 months [place], [facility]

## 2022-01-01 DIAGNOSIS — M9901 Segmental and somatic dysfunction of cervical region: Secondary | ICD-10-CM | POA: Diagnosis not present

## 2022-01-01 DIAGNOSIS — M5416 Radiculopathy, lumbar region: Secondary | ICD-10-CM | POA: Diagnosis not present

## 2022-01-01 DIAGNOSIS — M9903 Segmental and somatic dysfunction of lumbar region: Secondary | ICD-10-CM | POA: Diagnosis not present

## 2022-01-01 DIAGNOSIS — M5033 Other cervical disc degeneration, cervicothoracic region: Secondary | ICD-10-CM | POA: Diagnosis not present

## 2022-01-09 ENCOUNTER — Encounter: Payer: Self-pay | Admitting: Family Medicine

## 2022-01-09 ENCOUNTER — Ambulatory Visit (INDEPENDENT_AMBULATORY_CARE_PROVIDER_SITE_OTHER): Payer: Medicare Other | Admitting: Family Medicine

## 2022-01-09 ENCOUNTER — Ambulatory Visit: Payer: Medicare Other | Admitting: Family Medicine

## 2022-01-09 VITALS — BP 132/76 | HR 74 | Resp 16 | Ht 63.0 in | Wt 173.0 lb

## 2022-01-09 DIAGNOSIS — I5042 Chronic combined systolic (congestive) and diastolic (congestive) heart failure: Secondary | ICD-10-CM

## 2022-01-09 DIAGNOSIS — E785 Hyperlipidemia, unspecified: Secondary | ICD-10-CM

## 2022-01-09 DIAGNOSIS — N898 Other specified noninflammatory disorders of vagina: Secondary | ICD-10-CM | POA: Diagnosis not present

## 2022-01-09 DIAGNOSIS — M7521 Bicipital tendinitis, right shoulder: Secondary | ICD-10-CM | POA: Diagnosis not present

## 2022-01-09 DIAGNOSIS — I10 Essential (primary) hypertension: Secondary | ICD-10-CM

## 2022-01-09 DIAGNOSIS — G8929 Other chronic pain: Secondary | ICD-10-CM | POA: Diagnosis not present

## 2022-01-09 DIAGNOSIS — M810 Age-related osteoporosis without current pathological fracture: Secondary | ICD-10-CM

## 2022-01-09 DIAGNOSIS — I4819 Other persistent atrial fibrillation: Secondary | ICD-10-CM | POA: Diagnosis not present

## 2022-01-09 DIAGNOSIS — E1169 Type 2 diabetes mellitus with other specified complication: Secondary | ICD-10-CM

## 2022-01-09 DIAGNOSIS — R7989 Other specified abnormal findings of blood chemistry: Secondary | ICD-10-CM

## 2022-01-09 DIAGNOSIS — I700 Atherosclerosis of aorta: Secondary | ICD-10-CM | POA: Diagnosis not present

## 2022-01-09 DIAGNOSIS — M19011 Primary osteoarthritis, right shoulder: Secondary | ICD-10-CM | POA: Diagnosis not present

## 2022-01-09 DIAGNOSIS — M25511 Pain in right shoulder: Secondary | ICD-10-CM | POA: Diagnosis not present

## 2022-01-09 MED ORDER — FLUCONAZOLE 150 MG PO TABS: 150.0000 mg | ORAL_TABLET | Freq: Once | ORAL | 0 refills | Status: AC

## 2022-01-09 NOTE — Assessment & Plan Note (Signed)
Last A1c to goal, continue current meds. Continue to follow with Endo.

## 2022-01-09 NOTE — Assessment & Plan Note (Signed)
Euvolemic. Continue current meds. Continue to follow with Cardiology.

## 2022-01-09 NOTE — Progress Notes (Signed)
   SUBJECTIVE:   CHIEF COMPLAINT / HPI:   Hypertension, Afib, CHF: - follows with Cardiology - Medications: eliquis, lasix, coreg, enstresto, amiodarone. - thinking about starting jardiance, wanting to discuss with Dr. Honor Junes first.  - Compliance: good - Checking BP at home: no - Denies any SOB, CP, blood in stool, medication SEs, or symptoms of hypotension - some LE edema - wears compression socks.  Diabetes, Type 2 - follows with Endo, last appt 3/23 - Last A1c 6.7 08/2021 - Medications: lantus 16-22u, bydureon, humalog 5-10u TID - Compliance: good. Lantus 16-18u, humalog 6u - Checking BG at home: yes, highest 205 with pizza. 73 this am. 125 currently.  - Eye exam: UTD - Foot exam: UTD - Microalbumin: due - Statin: yes - PNA vaccine: UTD - Denies symptoms of hypoglycemia, polyuria, polydipsia, numbness extremities, foot ulcers/trauma  HLD - medications: crestor - compliance: good - medication SEs: none  Osteoporosis - on prolia. Previously on reclast, evista. Follows with Endo.  Vaginal itching - with some spotting yesterday. Typically gets with yeast infections. Nystatin cream helps.  No discharge.   OBJECTIVE:   BP 132/76   Pulse 74   Resp 16   Ht '5\' 3"'$  (1.6 m)   Wt 173 lb (78.5 kg)   SpO2 93%   BMI 30.65 kg/m   Gen: elderly, well appearing, in NAD Card: irregular rhythm, reg rate Lungs: CTAB Ext: WWP, trace LE edema to R leg   ASSESSMENT/PLAN:   Hypertension, benign At goal for age, no changes. Obtaining labs. Continue to follow with Cardiology.  Persistent atrial fibrillation ([place]) Remains in afib today. Rate controlled. tolerant of eliquis with no reports of bleeding. Continue current meds. Continue to follow with Cardiology.  Type 2 diabetes mellitus with hyperlipidemia (HCC) Last A1c to goal, continue current meds. Continue to follow with Endo.  Hyperlipidemia due to type 2 diabetes mellitus ([place]) Last LDL to goal. Tolerant of statin,  continue.  Age-related osteoporosis without current pathological fracture Continue current meds, continue to follow with Endo.  Morbid obesity ([place]) Contributing to CHF, diabetes. Work to maintain healthy lifestyle for age including diet and exercise as able.  Chronic combined systolic and diastolic congestive heart failure (HCC) Euvolemic. Continue current meds. Continue to follow with Cardiology.  Atherosclerosis of abdominal aorta ([place]) Work to control BP, cholesterol. On statin, BB. Continue to follow with Cardiology   Vaginal itching Will trial diflucan x1. If symptoms persist, recommend vaginal exam with testing.  Myles Gip, DO

## 2022-01-09 NOTE — Assessment & Plan Note (Signed)
Remains in afib today. Rate controlled. tolerant of eliquis with no reports of bleeding. Continue current meds. Continue to follow with Cardiology.

## 2022-01-09 NOTE — Assessment & Plan Note (Addendum)
Continue current meds, continue to follow with Endo.

## 2022-01-09 NOTE — Assessment & Plan Note (Signed)
Contributing to CHF, diabetes. Work to maintain healthy lifestyle for age including diet and exercise as able.

## 2022-01-09 NOTE — Assessment & Plan Note (Signed)
At goal for age, no changes. Obtaining labs. Continue to follow with Cardiology.

## 2022-01-09 NOTE — Assessment & Plan Note (Signed)
Last LDL to goal. Tolerant of statin, continue.

## 2022-01-09 NOTE — Patient Instructions (Signed)
It was great to see you!  Our plans for today:  - No changes to your medications.  - We are checking some labs today, we will release these results to your MyChart.  Take care and seek immediate care sooner if you develop any concerns.   Dr. Ky Barban

## 2022-01-09 NOTE — Assessment & Plan Note (Signed)
Work to control BP, cholesterol. On statin, BB. Continue to follow with Cardiology

## 2022-01-10 LAB — BASIC METABOLIC PANEL
BUN: 19 mg/dL (ref 7–25)
CO2: 31 mmol/L (ref 20–32)
Calcium: 8.8 mg/dL (ref 8.6–10.4)
Chloride: 103 mmol/L (ref 98–110)
Creat: 0.95 mg/dL (ref 0.60–1.00)
Glucose, Bld: 142 mg/dL — ABNORMAL HIGH (ref 65–99)
Potassium: 4.2 mmol/L (ref 3.5–5.3)
Sodium: 141 mmol/L (ref 135–146)

## 2022-01-10 LAB — MICROALBUMIN / CREATININE URINE RATIO
Creatinine, Urine: 22 mg/dL (ref 20–275)
Microalb, Ur: <0.2 mg/dL

## 2022-01-10 LAB — THYROID PANEL WITH TSH
Free Thyroxine Index: 2.1 (ref 1.4–3.8)
T3 Uptake: 32 % (ref 22–35)
T4, Total: 6.5 ug/dL (ref 5.1–11.9)
TSH: 6.13 mIU/L — ABNORMAL HIGH (ref 0.40–4.50)

## 2022-01-11 ENCOUNTER — Ambulatory Visit (INDEPENDENT_AMBULATORY_CARE_PROVIDER_SITE_OTHER): Payer: Medicare Other | Admitting: Podiatry

## 2022-01-11 ENCOUNTER — Encounter: Payer: Self-pay | Admitting: Podiatry

## 2022-01-11 ENCOUNTER — Other Ambulatory Visit: Payer: Self-pay | Admitting: Family Medicine

## 2022-01-11 DIAGNOSIS — M62830 Muscle spasm of back: Secondary | ICD-10-CM

## 2022-01-11 DIAGNOSIS — I701 Atherosclerosis of renal artery: Secondary | ICD-10-CM | POA: Diagnosis not present

## 2022-01-11 DIAGNOSIS — L98499 Non-pressure chronic ulcer of skin of other sites with unspecified severity: Secondary | ICD-10-CM | POA: Insufficient documentation

## 2022-01-11 DIAGNOSIS — L98491 Non-pressure chronic ulcer of skin of other sites limited to breakdown of skin: Secondary | ICD-10-CM

## 2022-01-11 DIAGNOSIS — Z7901 Long term (current) use of anticoagulants: Secondary | ICD-10-CM | POA: Diagnosis not present

## 2022-01-11 NOTE — Progress Notes (Signed)
This patient presents to the office saying she has developed an open skin lesion on the top of her left foot.  She says she had pain initially  and has been putting a band-aid at the open wound site.  She says it is now improving but since she is a diabetic she wants to double check her foot.    She presnets to the office for evaluation and treatment.  General Appearance  Alert, conversant and in no acute stress.  Vascular  Dorsalis pedis and posterior tibial  pulses are palpable  bilaterally.  Capillary return is within normal limits  bilaterally. Temperature is within normal limits  bilaterally.  Neurologic  Senn-Weinstein monofilament wire test within normal limits  bilaterally. Muscle power within normal limits bilaterally.  Nails Thick disfigured discolored nails with subungual debris  from hallux to fifth toes bilaterally. No evidence of bacterial infection or drainage bilaterally.  Orthopedic  No limitations of motion  feet .  No crepitus or effusions noted.  No bony pathology or digital deformities noted.  Skin  normotropic skin with no porokeratosis noted bilaterally.  No signs of infections or ulcers noted.   Callus sub 5th met right foot.  Open wound dorsum left foot above the midshaft of first metatarsal.  No redness or infection noted.    Open wound/ulcer left foot.  ROV.  Discussed this condition with this patient.  It appears as a healing blood blister on dorsum of foot.  Neosporin/DSD applied.   Peroxide at home.  RTC prn.  [name] DPM

## 2022-01-15 ENCOUNTER — Telehealth: Payer: Self-pay | Admitting: Podiatry

## 2022-01-15 NOTE — Telephone Encounter (Signed)
Pt called and the blister on the top of her foot has  gotten worse and she is asking if you could call in some doxycyline for her as she feels like it is getting infected.

## 2022-01-16 ENCOUNTER — Other Ambulatory Visit: Payer: Self-pay | Admitting: Podiatry

## 2022-01-16 ENCOUNTER — Other Ambulatory Visit: Payer: Self-pay | Admitting: *Deleted

## 2022-01-16 MED ORDER — DOXYCYCLINE HYCLATE 100 MG PO TABS: 100.0000 mg | ORAL_TABLET | Freq: Two times a day (BID) | ORAL | 0 refills | Status: DC

## 2022-01-16 MED ORDER — DOXYCYCLINE HYCLATE 100 MG PO TABS
100.0000 mg | ORAL_TABLET | Freq: Two times a day (BID) | ORAL | 0 refills | Status: DC
Start: 2022-01-16 — End: 2022-01-16

## 2022-01-16 NOTE — Telephone Encounter (Signed)
Doxycycline was sent to the pharmacy.

## 2022-01-16 NOTE — Telephone Encounter (Signed)
Patient would like to know when Dr. Prudence Davidson is going to send her medication for her foot, she has an infection.  Please advise

## 2022-01-19 ENCOUNTER — Ambulatory Visit
Admission: EM | Admit: 2022-01-19 | Discharge: 2022-01-19 | Disposition: A | Discharge status: Home / Self Care | Payer: Medicare Other | Attending: Family Medicine | Admitting: Family Medicine

## 2022-01-19 ENCOUNTER — Encounter: Payer: Self-pay | Admitting: Emergency Medicine

## 2022-01-19 DIAGNOSIS — Z23 Encounter for immunization: Secondary | ICD-10-CM

## 2022-01-19 DIAGNOSIS — W5503XA Scratched by cat, initial encounter: Secondary | ICD-10-CM | POA: Diagnosis not present

## 2022-01-19 DIAGNOSIS — S61412A Laceration without foreign body of left hand, initial encounter: Secondary | ICD-10-CM | POA: Diagnosis not present

## 2022-01-19 DIAGNOSIS — S60519A Abrasion of unspecified hand, initial encounter: Secondary | ICD-10-CM | POA: Diagnosis not present

## 2022-01-19 MED ORDER — TETANUS-DIPHTH-ACELL PERTUSSIS 5-2.5-18.5 LF-MCG/0.5 IM SUSY
0.5000 mL | PREFILLED_SYRINGE | Freq: Once | INTRAMUSCULAR | Status: AC
  Administered 2022-01-19: 0.5 mL via INTRAMUSCULAR

## 2022-01-19 MED ORDER — MICONAZOLE 3 200 MG VA SUPP
200.0000 mg | Freq: Every day | VAGINAL | 0 refills | Status: DC
Start: 2022-01-19 — End: 2022-04-12

## 2022-01-19 MED ORDER — AMOXICILLIN-POT CLAVULANATE 875-125 MG PO TABS: 1.0000 | ORAL_TABLET | Freq: Two times a day (BID) | ORAL | 0 refills | Status: DC

## 2022-01-19 NOTE — ED Provider Notes (Signed)
[name]    CSN: [id number] Arrival date & time: 01/19/22  1040      History   Chief Complaint Chief Complaint  Patient presents with   Hand Injury    HPI Bonnie Rowland is a 81 y.o. female.   HPI Patient presents today for evaluation for cat scratch which has resulted in a skin tear involving the left hand. Cat injury occurred last night. The cat belongs to the patient and is up to date on vaccines. Patient last TDAP was 10 years ago. She is currently taking Doxycyline for treatment of foot wound infection. She is afebrile.  Past Medical History:  Diagnosis Date   Acute cystitis    Acute on chronic combined systolic and diastolic CHF (congestive heart failure) (HCC)    Acute respiratory failure with hypoxia (HCC) 01/23/2021   AF (paroxysmal atrial fibrillation) ([place]) 01/23/2021   Allergic rhinitis, cause unspecified    Arthritis 2015   Atherosclerosis of renal artery (HCC)    left   Atrial fibrillation (HCC)    Bronchitis, not specified as acute or chronic    Cancer ([place]) 12/2013   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; left shoulder blade   Cancer ([place]) 05/2014   basal cell removed left temple   Cataract 2003   Cellulitis and abscess of leg, except foot    Conjunctivitis unspecified    Dermatophytosis of nail    Diabetes mellitus    type II   Elevated troponin    Esophageal reflux    Hyperlipidemia    Hypertension    Osteoporosis 2016   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)    Renal artery stenosis (HCC)    Sprain of lumbar region    Thoracic or lumbosacral neuritis or radiculitis, unspecified    Urinary tract infection, site not specified     Patient Active Problem List   Diagnosis Date Noted   Skin ulcer ([place]) 01/11/2022   Pain due to onychomycosis of toenails of both feet 09/07/2021   Persistent atrial fibrillation (HCC)    Chronic combined systolic and diastolic congestive heart failure ([place])    Type 2 diabetes mellitus with hyperlipidemia ([place]) 01/23/2021    Clavi 04/07/2020   Acute left-sided low back pain without sciatica 11/25/2019   Trochanteric bursitis of left hip 11/25/2019   Plantar fasciitis, bilateral 05/21/2019   Age-related osteoporosis without current pathological fracture 11/05/2017   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([place]) 07/09/2017   Atherosclerosis of abdominal aorta ([place]) 07/09/2017   Bilateral carotid bruits 08/24/2015   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   Chronic pain 01/10/2015   Type 2 diabetes, uncontrolled, 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 01/10/2015   Fatigue 01/10/2015   History of melanoma excision 01/10/2015   Morbid obesity ([place]) 01/10/2015   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   Spinal stenosis, lumbar region, 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   DDD (degenerative disc disease), lumbar 11/14/2014   Greater trochanteric bursitis of both hips 11/14/2014   Piriformis syndrome 11/14/2014   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   Hyperlipemia 11/07/2009   Hypertension, benign 11/07/2009   Atherosclerosis of renal artery ([place]) 11/07/2009   Hyperlipidemia due to type 2 diabetes mellitus ([place]) 11/07/2009   Perennial allergic rhinitis 11/27/2007   Lumbosacral neuritis 01/17/2007    Past Surgical History:  Procedure Laterality Date   APPENDECTOMY     CARDIAC CATHETERIZATION     CARDIOVERSION N/A 11/30/2020   Procedure: CARDIOVERSION;  Surgeon: Minna Merritts, MD;  Location: [facility];  Service: Cardiovascular;  Laterality: N/A;   CARDIOVERSION N/A 01/20/2021   Procedure: CARDIOVERSION;  Surgeon: Minna Merritts, MD;  Location: [facility];  Service: Cardiovascular;  Laterality: N/A;   CARDIOVERSION N/A 01/30/2021   Procedure: CARDIOVERSION;  Surgeon: Wellington Hampshire, MD;  Location: [facility];  Service: Cardiovascular;  Laterality: N/A;   cataract surgery     COLONOSCOPY     COLONOSCOPY WITH PROPOFOL N/A 05/09/2016   Procedure: COLONOSCOPY WITH PROPOFOL;  Surgeon: Robert Bellow, MD;  Location: [facility];  Service: Endoscopy;  Laterality: N/A;   DIAGNOSTIC MAMMOGRAM     EYE SURGERY Bilateral    Cataract Extraction   JOINT REPLACEMENT  2016   KNEE ARTHROPLASTY Right 03/30/2015   Procedure: COMPUTER ASSISTED TOTAL KNEE ARTHROPLASTY;  Surgeon: Dereck Leep, MD;  Location: [facility];  Service: Orthopedics;  Laterality: Right;   KNEE ARTHROSCOPY Right    KNEE CLOSED REDUCTION Right 05/23/2015   Procedure: CLOSED MANIPULATION KNEE;  Surgeon: Dereck Leep, MD;  Location: [facility];  Service: Orthopedics;  Laterality: Right;   RIGHT/LEFT HEART CATH AND CORONARY ANGIOGRAPHY N/A 01/25/2021   Procedure: RIGHT/LEFT HEART CATH AND CORONARY ANGIOGRAPHY;  Surgeon: Nelva Bush, MD;  Location: [facility];  Service: Cardiovascular;  Laterality: N/A;   TONSILLECTOMY      OB History   No obstetric history on file.      Home Medications    Prior to Admission medications   Medication Sig Start Date End Date Taking? Authorizing Provider  ALFALFA PO Take 1 tablet by mouth daily.   Yes [provider]  amiodarone (PACERONE) 200 MG tablet Take 0.5 tablets (100 mg total) by mouth daily. 04/12/21  Yes Dunn, Areta Haber, [name]  amoxicillin-clavulanate (AUGMENTIN) 875-125 MG tablet Take 1 tablet by mouth 2 (two) times daily for 7 days. 01/19/22 01/26/22 Yes Scot Jun, FNP  B Complex-C (B-COMPLEX WITH VITAMIN C) tablet Take 1 tablet by mouth 2 (two) times daily.   Yes [provider]  Calcium Carbonate (CALCIUM 600 PO) Take 600 mg by mouth daily.   Yes [provider]  doxycycline (VIBRA-TABS) 100 MG tablet Take 1 tablet (100 mg total) by mouth 2 (two) times daily. 01/16/22  Yes [name], DPM  ELIQUIS 5 MG TABS tablet TAKE 1 TABLET BY MOUTH TWICE A DAY 08/28/21  Yes Gollan, Kathlene November, MD  estradiol (ESTRACE VAGINAL) 0.1 MG/GM vaginal cream 1/2 gm once weekly using applicator, apply blueberry sized amount of cream using tip of  finger to urethra twice weekly 07/06/21  Yes McGowan, Larene Beach A, [name]  Exenatide ER (BYDUREON BCISE) 2 MG/0.85ML AUIJ Inject 2 mg into the skin every Sunday. 02/26/20  Yes [provider]  furosemide (LASIX) 40 MG tablet TAKE 1 TABLET BY MOUTH TWICE A DAY 08/06/21  Yes Hackney, Tina A, FNP  insulin glargine (LANTUS) 100 unit/mL SOPN Inject 16-22 Units into the skin at bedtime.   Yes [provider]  miconazole (MICONAZOLE 3) 200 MG vaginal suppository Place 1 suppository (200 mg total) vaginally at bedtime. 01/19/22  Yes Scot Jun, FNP  rosuvastatin (CRESTOR) 10 MG tablet TAKE 1 TABLET (10 MG TOTAL) BY MOUTH 4 (FOUR) TIMES A WEEK. 10/20/21  Yes Sowles, Drue Stager, MD  sacubitril-valsartan (ENTRESTO) 49-51 MG Take 1 tablet by mouth 2 (two) times daily. 10/11/21  Yes Alisa Graff, FNP  Vitamin D, Ergocalciferol, (DRISDOL) 1.25 MG (50000 UT) CAPS capsule Take 50,000 Units by mouth every Saturday. 11/23/18  Yes  [provider]  acetaminophen (TYLENOL) 650 MG CR tablet Take 1,300 mg by mouth at bedtime.    [provider]  ascorbic Acid (VITAMIN C) 500 MG CPCR Take 500 mg by mouth daily.    [provider]  BD PEN NEEDLE NANO U/F 32G X 4 MM MISC USE 4 TIMES DAILY (HUMALOG 3 TIMES DAILY AND LANTUS ONCE DAILY) OFFICE NOTIFIED 08/06/17 12/07/18   Steele Sizer, MD  BLACK ELDERBERRY,BERRY-FLOWER, PO Take 15 mLs by mouth daily. Liquid    [provider]  carvedilol (COREG) 3.125 MG tablet Take 3.125 mg by mouth 2 (two) times daily.    Rise Mu, [name]  cholecalciferol (VITAMIN D) 1000 UNITS tablet Take 1,000 Units by mouth daily. [place]    [provider]  clobetasol ointment (TEMOVATE) 6.19 % APPLY 1 APPLICATION TOPICALLY 2 TIMES DAILY AS NEEDED (BUG BITES). 12/05/21   Myles Gip, DO  Coenzyme Q10 (COQ10) 200 MG CAPS Take 200 mg by mouth daily.    [provider]  Continuous Blood Gluc Sensor ([place]) MISC by  Does not apply route.    [provider]  doxycycline (VIBRA-TABS) 100 MG tablet Take 1 tablet (100 mg total) by mouth 2 (two) times daily. 01/16/22   [name], DPM  Elastic Bandages & Supports (MEDICAL COMPRESSION STOCKINGS) [place] 1 each by Does not apply route daily. 01/08/19   Steele Sizer, MD  Glucosamine 500 MG TABS Take 500 mg by mouth daily.    [provider]  HUMALOG KWIKPEN 100 UNIT/ML KiwkPen Inject 5-10 Units into the skin 3 (three) times daily before meals. If needed sliding scale 10/15/17   [provider]  Javier Docker Oil 1000 MG CAPS Take 1,000 mg by mouth daily.    [provider]  Mag Aspart-Potassium Aspart (POTASSIUM & MAGNESIUM ASPARTAT PO) Take 1 tablet by mouth daily.    [provider]  Multiple Vitamins-Minerals (PRESERVISION AREDS 2) CAPS Take 1 capsule by mouth 2 (two) times daily.    [provider]  OVER THE COUNTER MEDICATION Place 1 application into both eyes See admin instructions. Blephadex eyelid foam, apply to eyelids once daily    [provider]  OVER THE COUNTER MEDICATION Take 1 tablet by mouth daily. Vita-Lea Gold otc supplement With out vitamin K ([place] products)    [provider]  OVER THE COUNTER MEDICATION Take 1 tablet by mouth See admin instructions. Nutriferon otc supplement take Take 1 tablet on Sun, Tues, Thurs, Sat. Take 1 tablet twice daily on Mon, Wed, and Fri    [provider]  Probiotic Product (PROBIOTIC PO) Take 1 capsule by mouth at bedtime.    [provider]  PROLIA 60 MG/ML SOSY injection Inject 60 mg into the skin every 6 (six) months. 09/02/19   [provider]  Propylene Glycol (SYSTANE BALANCE OP) Place 1 drop into both eyes daily as needed (dry eyes).    [provider]  Sodium Chloride-Sodium Bicarb (NETI POT SINUS [place] NA) Place 1 Dose into the nose at bedtime.    [provider]  tiZANidine (ZANAFLEX) 2 MG tablet TAKE  1-2 TABLETS (2-4 MG TOTAL) BY MOUTH AT BEDTIME. 12/27/21   Ancil Boozer, Drue Stager, MD  doxycycline (VIBRA-TABS) 100 MG tablet Take 1 tablet (100 mg total) by mouth 2 (two) times daily. 01/16/22   [name], DPM    Family History Family History  Problem Relation Age of Onset   Diabetes Mother  Hypertension Mother    Cancer Mother    Kidney disease Mother    Hypertension Father    Cancer Father        Prostate   Breast cancer Maternal Aunt 24   Colon cancer Son    Kidney cancer Neg Hx    Bladder Cancer Neg Hx     Social History Social History   Tobacco Use   Smoking status: Never   Smokeless tobacco: Never   Tobacco comments:    Smoking cessation materials not required  Vaping Use   Vaping Use: Never used  Substance Use Topics   Alcohol use: Yes    Alcohol/week: 1.0 standard drink of alcohol    Types: 1 Glasses of wine per week    Comment: RARELY   Drug use: No     Allergies   Cyclobenzaprine, Cheese, Ciprofloxacin hcl, Coconut (cocos nucifera), and Keflex [cephalexin]   Review of Systems Review of Systems Pertinent negatives listed in HPI   Physical Exam Triage Vital Signs ED Triage Vitals  Enc Vitals Group     BP 01/19/22 1046 (!) 152/70     Pulse Rate 01/19/22 1046 (!) 54     Resp 01/19/22 1046 16     Temp 01/19/22 1046 97.8 F (36.6 C)     Temp Source 01/19/22 1046 Oral     SpO2 01/19/22 1046 95 %     Weight --      Height --      Head Circumference --      Peak Flow --      Pain Score 01/19/22 1049 2     Pain Loc --      Pain Edu? --      Excl. in [place]? --    No data found.  Updated Vital Signs BP (!) 152/70 (BP Location: Left Arm)   Pulse (!) 54   Temp 97.8 F (36.6 C) (Oral)   Resp 16   SpO2 95%   Visual Acuity Right Eye Distance:   Left Eye Distance:   Bilateral Distance:    Right Eye Near:   Left Eye Near:    Bilateral Near:     Physical Exam Constitutional:      Appearance: She is obese. She is not ill-appearing.  HENT:      Head: Normocephalic and atraumatic.  Cardiovascular:     Rate and Rhythm: Normal rate. Rhythm irregular.  Pulmonary:     Effort: Pulmonary effort is normal.     Breath sounds: Normal breath sounds.  Skin:    General: Skin is warm.     Findings: Bruising and erythema present.     Comments: Skin tear on the dorsum of the left hand  Neurological:     General: No focal deficit present.     GCS: GCS eye subscore is 4. GCS verbal subscore is 5. GCS motor subscore is 6.  Psychiatric:        Attention and Perception: Attention and perception normal.        Mood and Affect: Mood normal.        Speech: Speech normal.        Behavior: Behavior normal.      UC Treatments / Results  Labs (all labs ordered are listed, but only abnormal results are displayed) Labs Reviewed - No data to display  EKG   Radiology No results found.  Procedures Procedures (including critical care time)  Medications Ordered in UC Medications  Tdap (BOOSTRIX) injection  0.5 mL (0.5 mLs Intramuscular Given 01/19/22 1103)    Initial Impression / Assessment and Plan / UC Course  I have reviewed the triage vital signs and the nursing notes.  Pertinent labs & imaging results that were available during my care of the patient were reviewed by me and considered in my medical decision making (see chart for details).     Tdap updated.  Patient is currently taking amiodarone for management of atrial fibrillation therefore azithromycin is contraindicated due to risk of QT prolongation.  Patient had also asked for Diflucan which is also contraindicated with amiodarone. Alternatively we will treat with Augmentin twice daily for 7 days.  Patient advised to hold doxycycline until completing Augmentin and then resume and complete treatment prescribed by podiatry. Advised to monitor for signs of infection, and if any worrisome symptoms or if area of injury worsens return for evaluation. Vaginal suppositories prescribed for  prevention of yeast related to her current antibiotic therapy. Final Clinical Impressions(s) / UC Diagnoses   Final diagnoses:  Skin tear of left hand without complication, initial encounter  Cat scratch of hand, initial encounter     Discharge Instructions      Hold Doxycyline, start Augmentin twice daily for 7 days then resume Doxycycline.  Azithromycin contraindicated due to Amiodarone. Monitor for signs of worsening infection, such as fever, redness, or swelling beyond the site of which the initial skin tear is present.  If any signs of infection develop return immediately for evaluation. Start miconazole 200 mg vaginal insert for yeast prevention. Fluconazole is contraindicated for prolonged use with amiodarone as this can cause cardiac complications.   You received Tetanus vaccine.     ED Prescriptions     Medication Sig Dispense Auth. Provider   amoxicillin-clavulanate (AUGMENTIN) 875-125 MG tablet Take 1 tablet by mouth 2 (two) times daily for 7 days. 14 tablet Scot Jun, FNP   miconazole (MICONAZOLE 3) 200 MG vaginal suppository Place 1 suppository (200 mg total) vaginally at bedtime. 3 suppository Scot Jun, FNP      PDMP not reviewed this encounter.   Scot Jun, FNP 01/19/22 [phone]

## 2022-01-19 NOTE — ED Triage Notes (Signed)
Patient c/o LFT hand injury that occurred yesterday 0600.   Patient denies any abnormal drainage.   Patient endorses cat scratch occurred upon onset of symptoms.   Patient endorses pain in movement.   Patient endorses redness and swelling.   Patient is currently taking blood thinners.   Patient is currently taking doxycyline 100 mg twice daily for blister on foot.   History of DM.   Patient has used Hydrogen Peroxide to clean area.

## 2022-01-19 NOTE — Discharge Instructions (Addendum)
Hold Doxycyline, start Augmentin twice daily for 7 days then resume Doxycycline.  Azithromycin contraindicated due to Amiodarone. Monitor for signs of worsening infection, such as fever, redness, or swelling beyond the site of which the initial skin tear is present.  If any signs of infection develop return immediately for evaluation. Start miconazole 200 mg vaginal insert for yeast prevention. Fluconazole is contraindicated for prolonged use with amiodarone as this can cause cardiac complications.   You received Tetanus vaccine.

## 2022-01-22 DIAGNOSIS — M9903 Segmental and somatic dysfunction of lumbar region: Secondary | ICD-10-CM | POA: Diagnosis not present

## 2022-01-22 DIAGNOSIS — M9901 Segmental and somatic dysfunction of cervical region: Secondary | ICD-10-CM | POA: Diagnosis not present

## 2022-01-22 DIAGNOSIS — M5416 Radiculopathy, lumbar region: Secondary | ICD-10-CM | POA: Diagnosis not present

## 2022-01-22 DIAGNOSIS — M5033 Other cervical disc degeneration, cervicothoracic region: Secondary | ICD-10-CM | POA: Diagnosis not present

## 2022-01-23 ENCOUNTER — Ambulatory Visit
Admission: EM | Admit: 2022-01-23 | Discharge: 2022-01-23 | Disposition: A | Discharge status: Home / Self Care | Payer: Medicare Other | Attending: Emergency Medicine | Admitting: Emergency Medicine

## 2022-01-23 ENCOUNTER — Encounter: Payer: Self-pay | Admitting: Emergency Medicine

## 2022-01-23 DIAGNOSIS — L03114 Cellulitis of left upper limb: Secondary | ICD-10-CM

## 2022-01-23 DIAGNOSIS — W5503XA Scratched by cat, initial encounter: Secondary | ICD-10-CM | POA: Diagnosis not present

## 2022-01-23 DIAGNOSIS — I10 Essential (primary) hypertension: Secondary | ICD-10-CM | POA: Diagnosis not present

## 2022-01-23 MED ORDER — MUPIROCIN 2 % EX OINT: 1.0000 | TOPICAL_OINTMENT | Freq: Two times a day (BID) | CUTANEOUS | 0 refills | Status: DC

## 2022-01-23 MED ORDER — AMOXICILLIN-POT CLAVULANATE 875-125 MG PO TABS: 1.0000 | ORAL_TABLET | Freq: Two times a day (BID) | ORAL | 0 refills | Status: AC

## 2022-01-23 NOTE — ED Triage Notes (Signed)
Pt here for wound recheck of left hand. Pt states it is getting worse.

## 2022-01-23 NOTE — ED Provider Notes (Signed)
Bonnie Rowland    CSN: [id number] Arrival date & time: 01/23/22  1819      History   Chief Complaint Chief Complaint  Patient presents with   Wound Check    HPI Bonnie Rowland is a 81 y.o. female.  Patient presents with worsening redness, pain, swelling of left hand due to a cat scratch that occurred 5 days ago.  No wound drainage, fever, chills, or other symptoms.  She was seen at this urgent care on 01/19/2022; diagnosed with skin tear of left hand and cat scratch of hand; treated with Augmentin and miconazole; tetanus updated.  Patient was already being treated with doxycycline by podiatrist for an infection on her foot; patient was instructed to pause the doxycycline while on Augmentin.  Her medical history includes diabetes, atrial fibrillation, hypertension, heart failure.  The history is provided by the patient and medical records.    Past Medical History:  Diagnosis Date   Acute cystitis    Acute on chronic combined systolic and diastolic CHF (congestive heart failure) (HCC)    Acute respiratory failure with hypoxia (HCC) 01/23/2021   AF (paroxysmal atrial fibrillation) ([place]) 01/23/2021   Allergic rhinitis, cause unspecified    Arthritis 2015   Atherosclerosis of renal artery (HCC)    left   Atrial fibrillation (HCC)    Bronchitis, not specified as acute or chronic    Cancer ([place]) 12/2013   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; left shoulder blade   Cancer ([place]) 05/2014   basal cell removed left temple   Cataract 2003   Cellulitis and abscess of leg, except foot    Conjunctivitis unspecified    Dermatophytosis of nail    Diabetes mellitus    type II   Elevated troponin    Esophageal reflux    Hyperlipidemia    Hypertension    Osteoporosis 2016   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)    Renal artery stenosis (HCC)    Sprain of lumbar region    Thoracic or lumbosacral neuritis or radiculitis, unspecified    Urinary tract infection, site not specified     Patient Active  Problem List   Diagnosis Date Noted   Skin ulcer ([place]) 01/11/2022   Pain due to onychomycosis of toenails of both feet 09/07/2021   Persistent atrial fibrillation (HCC)    Chronic combined systolic and diastolic congestive heart failure ([place])    Type 2 diabetes mellitus with hyperlipidemia ([place]) 01/23/2021   Clavi 04/07/2020   Acute left-sided low back pain without sciatica 11/25/2019   Trochanteric bursitis of left hip 11/25/2019   Plantar fasciitis, bilateral 05/21/2019   Age-related osteoporosis without current pathological fracture 11/05/2017   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([place]) 07/09/2017   Atherosclerosis of abdominal aorta ([place]) 07/09/2017   Bilateral carotid bruits 08/24/2015   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   Chronic pain 01/10/2015   Type 2 diabetes, uncontrolled, 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 01/10/2015   Fatigue 01/10/2015   History of melanoma excision 01/10/2015   Morbid obesity ([place]) 01/10/2015   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   Spinal stenosis, lumbar region, 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   DDD (degenerative disc disease), lumbar 11/14/2014   Greater trochanteric bursitis of both hips 11/14/2014   Piriformis syndrome 11/14/2014   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   Hyperlipemia 11/07/2009   Hypertension, benign 11/07/2009   Atherosclerosis of renal artery ([place]) 11/07/2009   Hyperlipidemia due to type 2 diabetes mellitus ([place]) 11/07/2009   Perennial allergic  rhinitis 11/27/2007   Lumbosacral neuritis 01/17/2007    Past Surgical History:  Procedure Laterality Date   APPENDECTOMY     CARDIAC CATHETERIZATION     CARDIOVERSION N/A 11/30/2020   Procedure: CARDIOVERSION;  Surgeon: Minna Merritts, MD;  Location: [facility];  Service: Cardiovascular;  Laterality: N/A;   CARDIOVERSION N/A 01/20/2021   Procedure: CARDIOVERSION;  Surgeon: Minna Merritts, MD;  Location: [facility];  Service: Cardiovascular;   Laterality: N/A;   CARDIOVERSION N/A 01/30/2021   Procedure: CARDIOVERSION;  Surgeon: Wellington Hampshire, MD;  Location: [facility];  Service: Cardiovascular;  Laterality: N/A;   cataract surgery     COLONOSCOPY     COLONOSCOPY WITH PROPOFOL N/A 05/09/2016   Procedure: COLONOSCOPY WITH PROPOFOL;  Surgeon: Robert Bellow, MD;  Location: [facility];  Service: Endoscopy;  Laterality: N/A;   DIAGNOSTIC MAMMOGRAM     EYE SURGERY Bilateral    Cataract Extraction   JOINT REPLACEMENT  2016   KNEE ARTHROPLASTY Right 03/30/2015   Procedure: COMPUTER ASSISTED TOTAL KNEE ARTHROPLASTY;  Surgeon: Dereck Leep, MD;  Location: [facility];  Service: Orthopedics;  Laterality: Right;   KNEE ARTHROSCOPY Right    KNEE CLOSED REDUCTION Right 05/23/2015   Procedure: CLOSED MANIPULATION KNEE;  Surgeon: Dereck Leep, MD;  Location: [facility];  Service: Orthopedics;  Laterality: Right;   RIGHT/LEFT HEART CATH AND CORONARY ANGIOGRAPHY N/A 01/25/2021   Procedure: RIGHT/LEFT HEART CATH AND CORONARY ANGIOGRAPHY;  Surgeon: Nelva Bush, MD;  Location: [facility];  Service: Cardiovascular;  Laterality: N/A;   TONSILLECTOMY      OB History   No obstetric history on file.      Home Medications    Prior to Admission medications   Medication Sig Start Date End Date Taking? Authorizing Provider  mupirocin ointment (BACTROBAN) 2 % Apply 1 Application topically 2 (two) times daily. 01/23/22  Yes Sharion Balloon, NP  acetaminophen (TYLENOL) 650 MG CR tablet Take 1,300 mg by mouth at bedtime.    [provider]  ALFALFA PO Take 1 tablet by mouth daily.    [provider]  amiodarone (PACERONE) 200 MG tablet Take 0.5 tablets (100 mg total) by mouth daily. 04/12/21   Dunn, Areta Haber, [name]  amoxicillin-clavulanate (AUGMENTIN) 875-125 MG tablet Take 1 tablet by mouth 2 (two) times daily for 7 days. 01/23/22 01/30/22  Sharion Balloon, NP  ascorbic Acid (VITAMIN C) 500 MG CPCR Take 500 mg by mouth daily.     [provider]  B Complex-C (B-COMPLEX WITH VITAMIN C) tablet Take 1 tablet by mouth 2 (two) times daily.    [provider]  BD PEN NEEDLE NANO U/F 32G X 4 MM MISC USE 4 TIMES DAILY (HUMALOG 3 TIMES DAILY AND LANTUS ONCE DAILY) OFFICE NOTIFIED 08/06/17 12/07/18   Steele Sizer, MD  BLACK ELDERBERRY,BERRY-FLOWER, PO Take 15 mLs by mouth daily. Liquid    [provider]  Calcium Carbonate (CALCIUM 600 PO) Take 600 mg by mouth daily.    [provider]  carvedilol (COREG) 3.125 MG tablet Take 3.125 mg by mouth 2 (two) times daily.    Rise Mu, [name]  cholecalciferol (VITAMIN D) 1000 UNITS tablet Take 1,000 Units by mouth daily. Shaklee    [provider]  clobetasol ointment (TEMOVATE) 6.43 % APPLY 1 APPLICATION TOPICALLY 2 TIMES DAILY AS NEEDED (BUG BITES). 12/05/21   Myles Gip, DO  Coenzyme Q10 (COQ10) 200 MG CAPS Take 200 mg by  mouth daily.    [provider]  Continuous Blood Gluc Sensor ([place]) MISC by Does not apply route.    [provider]  doxycycline (VIBRA-TABS) 100 MG tablet Take 1 tablet (100 mg total) by mouth 2 (two) times daily. 01/16/22   [name], DPM  doxycycline (VIBRA-TABS) 100 MG tablet Take 1 tablet (100 mg total) by mouth 2 (two) times daily. 01/16/22   [name], DPM  Elastic Bandages & Supports (MEDICAL COMPRESSION STOCKINGS) [place] 1 each by Does not apply route daily. 01/08/19   Sowles, Drue Stager, MD  ELIQUIS 5 MG TABS tablet TAKE 1 TABLET BY MOUTH TWICE A DAY 08/28/21   Minna Merritts, MD  estradiol (ESTRACE VAGINAL) 0.1 MG/GM vaginal cream 1/2 gm once weekly using applicator, apply blueberry sized amount of cream using tip of finger to urethra twice weekly 07/06/21   Zara Council A, [name]  Exenatide ER (BYDUREON BCISE) 2 MG/0.85ML AUIJ Inject 2 mg into the skin every Sunday. 02/26/20   [provider]  furosemide (LASIX) 40 MG tablet TAKE 1 TABLET BY MOUTH TWICE A  DAY 08/06/21   [name] A, FNP  Glucosamine 500 MG TABS Take 500 mg by mouth daily.    [provider]  HUMALOG KWIKPEN 100 UNIT/ML KiwkPen Inject 5-10 Units into the skin 3 (three) times daily before meals. If needed sliding scale 10/15/17   [provider]  insulin glargine (LANTUS) 100 unit/mL SOPN Inject 16-22 Units into the skin at bedtime.    [provider]  Javier Docker Oil 1000 MG CAPS Take 1,000 mg by mouth daily.    [provider]  Mag Aspart-Potassium Aspart (POTASSIUM & MAGNESIUM ASPARTAT PO) Take 1 tablet by mouth daily.    [provider]  miconazole (MICONAZOLE 3) 200 MG vaginal suppository Place 1 suppository (200 mg total) vaginally at bedtime. 01/19/22   Scot Jun, FNP  Multiple Vitamins-Minerals (PRESERVISION AREDS 2) CAPS Take 1 capsule by mouth 2 (two) times daily.    [provider]  OVER THE COUNTER MEDICATION Place 1 application into both eyes See admin instructions. Blephadex eyelid foam, apply to eyelids once daily    [provider]  OVER THE COUNTER MEDICATION Take 1 tablet by mouth daily. Vita-Lea Gold otc supplement With out vitamin K (Shaklee products)    [provider]  OVER THE COUNTER MEDICATION Take 1 tablet by mouth See admin instructions. Nutriferon otc supplement take Take 1 tablet on Sun, Tues, Thurs, Sat. Take 1 tablet twice daily on Mon, Wed, and Fri    [provider]  Probiotic Product (PROBIOTIC PO) Take 1 capsule by mouth at bedtime.    [provider]  PROLIA 60 MG/ML SOSY injection Inject 60 mg into the skin every 6 (six) months. 09/02/19   [provider]  Propylene Glycol (SYSTANE BALANCE OP) Place 1 drop into both eyes daily as needed (dry eyes).    [provider]  rosuvastatin (CRESTOR) 10 MG tablet TAKE 1 TABLET (10 MG TOTAL) BY MOUTH 4 (FOUR) TIMES A WEEK. 10/20/21   Sowles, Drue Stager, MD  sacubitril-valsartan (ENTRESTO) 49-51 MG Take 1  tablet by mouth 2 (two) times daily. 10/11/21   Alisa Graff, FNP  Sodium Chloride-Sodium Bicarb (NETI POT SINUS WASH NA) Place 1 Dose into the nose at bedtime.    [provider]  tiZANidine (ZANAFLEX) 2 MG tablet TAKE 1-2 TABLETS (2-4 MG TOTAL) BY MOUTH AT BEDTIME. 12/27/21   Sowles,  Drue Stager, MD  Vitamin D, Ergocalciferol, (DRISDOL) 1.25 MG (50000 UT) CAPS capsule Take 50,000 Units by mouth every Saturday. 11/23/18   [provider]  doxycycline (VIBRA-TABS) 100 MG tablet Take 1 tablet (100 mg total) by mouth 2 (two) times daily. 01/16/22   [name], DPM    Family History Family History  Problem Relation Age of Onset   Diabetes Mother    Hypertension Mother    Cancer Mother    Kidney disease Mother    Hypertension Father    Cancer Father        Prostate   Breast cancer Maternal Aunt 28   Colon cancer Son    Kidney cancer Neg Hx    Bladder Cancer Neg Hx     Social History Social History   Tobacco Use   Smoking status: Never   Smokeless tobacco: Never   Tobacco comments:    Smoking cessation materials not required  Vaping Use   Vaping Use: Never used  Substance Use Topics   Alcohol use: Yes    Alcohol/week: 1.0 standard drink of alcohol    Types: 1 Glasses of wine per week    Comment: RARELY   Drug use: No     Allergies   Cyclobenzaprine, Cheese, Ciprofloxacin hcl, Coconut (cocos nucifera), and Keflex [cephalexin]   Review of Systems Review of Systems  Constitutional:  Negative for chills and fever.  Skin:  Positive for color change and wound.  All other systems reviewed and are negative.    Physical Exam Triage Vital Signs ED Triage Vitals  Enc Vitals Group     BP      Pulse      Resp      Temp      Temp src      SpO2      Weight      Height      Head Circumference      Peak Flow      Pain Score      Pain Loc      Pain Edu?      Excl. in [place]?    No data found.  Updated Vital Signs BP (!) 166/71   Pulse 64   Temp 98.8  F (37.1 C)   Resp 18   SpO2 96%   Visual Acuity Right Eye Distance:   Left Eye Distance:   Bilateral Distance:    Right Eye Near:   Left Eye Near:    Bilateral Near:     Physical Exam Vitals and nursing note reviewed.  Constitutional:      General: She is not in acute distress.    Appearance: She is well-developed.  HENT:     Mouth/Throat:     Mouth: Mucous membranes are moist.  Cardiovascular:     Rate and Rhythm: Normal rate and regular rhythm.  Pulmonary:     Effort: Pulmonary effort is normal. No respiratory distress.  Musculoskeletal:        General: Normal range of motion.     Cervical back: Neck supple.  Skin:    General: Skin is warm and dry.     Capillary Refill: Capillary refill takes less than 2 seconds.     Findings: Erythema and lesion present.     Comments: Skin tear on left hand with surrounding erythema.  See picture.   Neurological:     Mental Status: She is alert.     Sensory: No sensory deficit.  Motor: No weakness.  Psychiatric:        Mood and Affect: Mood normal.        Behavior: Behavior normal.       UC Treatments / Results  Labs (all labs ordered are listed, but only abnormal results are displayed) Labs Reviewed - No data to display  EKG   Radiology No results found.  Procedures Procedures (including critical care time)  Medications Ordered in UC Medications - No data to display  Initial Impression / Assessment and Plan / UC Course  I have reviewed the triage vital signs and the nursing notes.  Pertinent labs & imaging results that were available during my care of the patient were reviewed by me and considered in my medical decision making (see chart for details).    Cellulitis of left hand due to cat scratch.  Elevated blood pressure reading with hypertension.  Treating today with extended course of Augmentin (for a total of 14 days) and mupirocin ointment.  Instructed patient to continue to pause the doxycycline while  on Augmentin.  Wound care instructions and signs of worsening infection discussed.  Instructed patient to follow-up with her PCP in 2 days for recheck of her wound.  Also discussed with patient that her blood pressure is elevated today and needs to be rechecked by PCP in 2 to 4 weeks.  Education provided on managing hypertension.  She agrees to plan of care.   Final Clinical Impressions(s) / UC Diagnoses   Final diagnoses:  Cellulitis of hand, left  Cat scratch  Elevated blood pressure reading in office with diagnosis of hypertension     Discharge Instructions      Continue the Augmentin for a total of 14 days.  Follow up with your doctor in 2 days for a recheck of your wound.  Keep your wound clean and dry.  Wash it gently twice a day with soap and water.  Apply an antibiotic cream and bandage twice a day.    Your blood pressure is elevated today at 166/71.  Please have this rechecked by your primary care provider in 2-4 weeks.          ED Prescriptions     Medication Sig Dispense Auth. Provider   amoxicillin-clavulanate (AUGMENTIN) 875-125 MG tablet Take 1 tablet by mouth 2 (two) times daily for 7 days. 14 tablet Sharion Balloon, NP   mupirocin ointment (BACTROBAN) 2 % Apply 1 Application topically 2 (two) times daily. 22 g Sharion Balloon, NP      PDMP not reviewed this encounter.   Sharion Balloon, NP 01/23/22 1850

## 2022-01-23 NOTE — Discharge Instructions (Addendum)
Continue the Augmentin for a total of 14 days.  Follow up with your doctor in 2 days for a recheck of your wound.  Keep your wound clean and dry.  Wash it gently twice a day with soap and water.  Apply an antibiotic cream and bandage twice a day.    Your blood pressure is elevated today at 166/71.  Please have this rechecked by your primary care provider in 2-4 weeks.

## 2022-01-29 ENCOUNTER — Other Ambulatory Visit: Payer: Self-pay | Admitting: Physician Assistant

## 2022-01-31 DIAGNOSIS — E113293 Type 2 diabetes mellitus with mild nonproliferative diabetic retinopathy without macular edema, bilateral: Secondary | ICD-10-CM | POA: Diagnosis not present

## 2022-01-31 LAB — HM DIABETES EYE EXAM

## 2022-02-05 DIAGNOSIS — M9901 Segmental and somatic dysfunction of cervical region: Secondary | ICD-10-CM | POA: Diagnosis not present

## 2022-02-05 DIAGNOSIS — M5033 Other cervical disc degeneration, cervicothoracic region: Secondary | ICD-10-CM | POA: Diagnosis not present

## 2022-02-05 DIAGNOSIS — M9903 Segmental and somatic dysfunction of lumbar region: Secondary | ICD-10-CM | POA: Diagnosis not present

## 2022-02-05 DIAGNOSIS — M5416 Radiculopathy, lumbar region: Secondary | ICD-10-CM | POA: Diagnosis not present

## 2022-02-06 ENCOUNTER — Telehealth: Payer: Self-pay

## 2022-02-06 NOTE — Progress Notes (Signed)
Chronic Care Management Pharmacy Assistant   Name: Bonnie Rowland  MRN: [id number] DOB: 01/30/1941  Reason for Encounter: Diabetes Disease State Call/Urgent Care Follow-up   Recent office visits:  01/09/2022 [name], DO (PCP Office Visit) for Follow-up- Started: Fluconazole 150 mg, Stopped: Metoprolol Succinate 50 mg due to patient not taking, Lab orders placed, Patient to follow-up in 6 months  Recent consult visits:  01/11/2022 [name], DPM (Podiatry) for DM- No medication changes noted, No orders placed, Patient to follow-up in 3 months  01/09/2022 [name], DO (Ortho Surgery) for Chronic R shoulder pain- No medication changes noted, No orders placed, No follow-up noted  12/27/2021 [name], FNP (CHF Clinic)  for CHF- No medication changes noted, No orders placed, BP 150/44 (informed provider she forgot to take morning meds), Patient to follow-up in 4 months  Hospital visits:   Medication Reconciliation was completed by comparing discharge summary, patient's EMR and Pharmacy list, and upon discussion with patient.  Admitted to the Urgent Care on 01/23/2022 due to Wound Check. Discharge date was 01/23/2022. Discharged from [facility] [place].    New?Medications Started at [facility] Discharge:?? -Started  Mupirocin 2% 1 application Topical twice daily  Medication Changes at Hospital Discharge: -Changed None ID  Medications Discontinued at Hospital Discharge: -Stopped None ID  Medications that remain the same after Hospital Discharge:??  -All other medications will remain the same.    Medication Reconciliation was completed by comparing discharge summary, patient's EMR and Pharmacy list, and upon discussion with patient.  Admitted to the Urgent Care on 01/19/2022 due to Hand Injury. Discharge date was 01/19/2022. Discharged from [facility] at [place].    New?Medications Started at [facility] Discharge:?? -Started   Amoxicillin-Pot Clavulanate 875-125 mg 1 tablet twice daily Miconazole Nitrate 200 mg Vaginal Daily at bedtime  Medication Changes at Hospital Discharge: -Changed None ID  Medications Discontinued at Hospital Discharge: -Stopped None ID  Medications that remain the same after Hospital Discharge:??  -All other medications will remain the same.    Medications: Outpatient Encounter Medications as of 02/06/2022  Medication Sig Note   carvedilol (COREG) 3.125 MG tablet TAKE 1 TABLET BY MOUTH 2 TIMES DAILY.    acetaminophen (TYLENOL) 650 MG CR tablet Take 1,300 mg by mouth at bedtime.    ALFALFA PO Take 1 tablet by mouth daily.    amiodarone (PACERONE) 200 MG tablet Take 0.5 tablets (100 mg total) by mouth daily.    ascorbic Acid (VITAMIN C) 500 MG CPCR Take 500 mg by mouth daily.    B Complex-C (B-COMPLEX WITH VITAMIN C) tablet Take 1 tablet by mouth 2 (two) times daily.    BD PEN NEEDLE NANO U/F 32G X 4 MM MISC USE 4 TIMES DAILY (HUMALOG 3 TIMES DAILY AND LANTUS ONCE DAILY) OFFICE NOTIFIED 08/06/17    BLACK ELDERBERRY,BERRY-FLOWER, PO Take 15 mLs by mouth daily. Liquid    Calcium Carbonate (CALCIUM 600 PO) Take 600 mg by mouth daily.    cholecalciferol (VITAMIN D) 1000 UNITS tablet Take 1,000 Units by mouth daily. Shaklee    clobetasol ointment (TEMOVATE) 5.28 % APPLY 1 APPLICATION TOPICALLY 2 TIMES DAILY AS NEEDED (BUG BITES).    Coenzyme Q10 (COQ10) 200 MG CAPS Take 200 mg by mouth daily.    Continuous Blood Gluc Sensor ([place]) MISC by Does not apply route.    doxycycline (VIBRA-TABS) 100 MG tablet Take 1 tablet (100 mg total) by mouth 2 (two)  times daily.    doxycycline (VIBRA-TABS) 100 MG tablet Take 1 tablet (100 mg total) by mouth 2 (two) times daily.    Elastic Bandages & Supports (MEDICAL COMPRESSION STOCKINGS) MISC 1 each by Does not apply route daily.    ELIQUIS 5 MG TABS tablet TAKE 1 TABLET BY MOUTH TWICE A DAY    estradiol (ESTRACE VAGINAL) 0.1 MG/GM  vaginal cream 1/2 gm once weekly using applicator, apply blueberry sized amount of cream using tip of finger to urethra twice weekly    Exenatide ER (BYDUREON BCISE) 2 MG/0.85ML AUIJ Inject 2 mg into the skin every Sunday.    furosemide (LASIX) 40 MG tablet TAKE 1 TABLET BY MOUTH TWICE A DAY    Glucosamine 500 MG TABS Take 500 mg by mouth daily.    HUMALOG KWIKPEN 100 UNIT/ML KiwkPen Inject 5-10 Units into the skin 3 (three) times daily before meals. If needed sliding scale    insulin glargine (LANTUS) 100 unit/mL SOPN Inject 16-22 Units into the skin at bedtime.    Krill Oil 1000 MG CAPS Take 1,000 mg by mouth daily.    Mag Aspart-Potassium Aspart (POTASSIUM & MAGNESIUM ASPARTAT PO) Take 1 tablet by mouth daily.    miconazole (MICONAZOLE 3) 200 MG vaginal suppository Place 1 suppository (200 mg total) vaginally at bedtime.    Multiple Vitamins-Minerals (PRESERVISION AREDS 2) CAPS Take 1 capsule by mouth 2 (two) times daily.    mupirocin ointment (BACTROBAN) 2 % Apply 1 Application topically 2 (two) times daily.    OVER THE COUNTER MEDICATION Place 1 application into both eyes See admin instructions. Blephadex eyelid foam, apply to eyelids once daily    OVER THE COUNTER MEDICATION Take 1 tablet by mouth daily. Vita-Lea Gold otc supplement With out vitamin K (Shaklee products)    OVER THE COUNTER MEDICATION Take 1 tablet by mouth See admin instructions. Nutriferon otc supplement take Take 1 tablet on Sun, Tues, Thurs, Sat. Take 1 tablet twice daily on Mon, Wed, and Fri    Probiotic Product (PROBIOTIC PO) Take 1 capsule by mouth at bedtime.    PROLIA 60 MG/ML SOSY injection Inject 60 mg into the skin every 6 (six) months.    Propylene Glycol (SYSTANE BALANCE OP) Place 1 drop into both eyes daily as needed (dry eyes).    rosuvastatin (CRESTOR) 10 MG tablet TAKE 1 TABLET (10 MG TOTAL) BY MOUTH 4 (FOUR) TIMES A WEEK. 12/27/2021: Taking every day   sacubitril-valsartan (ENTRESTO) 49-51 MG Take 1 tablet  by mouth 2 (two) times daily.    Sodium Chloride-Sodium Bicarb (NETI POT SINUS WASH NA) Place 1 Dose into the nose at bedtime.    tiZANidine (ZANAFLEX) 2 MG tablet TAKE 1-2 TABLETS (2-4 MG TOTAL) BY MOUTH AT BEDTIME.    Vitamin D, Ergocalciferol, (DRISDOL) 1.25 MG (50000 UT) CAPS capsule Take 50,000 Units by mouth every Saturday.    [DISCONTINUED] doxycycline (VIBRA-TABS) 100 MG tablet Take 1 tablet (100 mg total) by mouth 2 (two) times daily.    No facility-administered encounter medications on file as of 02/06/2022.   Care Gaps: Dexa Scan Diabetic Eye Exam Influenza Vaccine Hemoglobin A1C last done 09/21/2021  Star Rating Drugs: Rosuvastatin 10 mg last filled on 12/09/2021 for a 90-Day supply with [facility]  Recent Relevant Labs: Lab Results  Component Value Date/Time   HGBA1C 6.7 09/21/2021 12:00 AM   HGBA1C 7.1 03/23/2021 12:00 AM   MICROALBUR <0.2 01/09/2022 08:49 AM   MICROALBUR Negative 08/21/2019 12:00 AM  MICROALBUR 0 02/18/2017 09:01 AM   MICROALBUR 50 04/23/2016 10:37 AM    Kidney Function Lab Results  Component Value Date/Time   CREATININE 0.95 01/09/2022 08:49 AM   CREATININE 0.85 07/12/2021 08:44 AM   GFRNONAA >60 03/13/2021 10:42 AM   GFRNONAA 88 04/11/2018 10:32 AM   GFRAA 102 04/11/2018 10:32 AM   Current antihyperglycemic regimen:  Byduren BCISE 2 mg weekly Humalog 4-12 units three times daily (has not needed) Lantus 22 units nightly (15 nightly)   What recent interventions/DTPs have been made to improve glycemic control:  None ID  Have there been any recent hospitalizations or ED visits since last visit with CPP? Yes Urgent care visits due to a wound.  Patient denies hypoglycemic symptoms, including Pale, Sweaty, Shaky, Hungry, Nervous/irritable, and Vision changes Patient denies hyperglycemic symptoms, including blurry vision, excessive thirst, fatigue, polyuria, and weakness How often are you checking your blood sugar? 3-4 times daily patient has  a freestyle Libre monitor  During the week, how often does your blood glucose drop below 70? Never Are you checking your feet daily/regularly? Yes  Adherence Review: Is the patient currently on a STATIN medication? Yes Is the patient currently on ACE/ARB medication? No Does the patient have >5 day gap between last estimated fill dates? No  I spoke to patient and she reports that she is doing well. Per patient she was in Urgent Care due to her cat scratching her and it did create a wound on her arm. Patient reports that her wound is looking much better and stated the only issue she has at the moment is needing another refill on her Fluconazole. Per patient PCP did give her only 1 with no refills and she really needed more of this medication.  Patient stated she has contacted her CHF provider to make sure that the medication doesn't interact with her heart medications and she will ask them to provider her with a new prescription and refills on this prescription. Patient denies any other ill symptoms at this time and has no additional concerns or issues.  Telephone follow up appointment with care management team member scheduled for:  03/21/2022 at 2:00 PM   Lynann Bologna, [place] Pharmacist Assistant Phone: [phone]

## 2022-02-07 DIAGNOSIS — L570 Actinic keratosis: Secondary | ICD-10-CM | POA: Diagnosis not present

## 2022-02-07 DIAGNOSIS — D2262 Melanocytic nevi of left upper limb, including shoulder: Secondary | ICD-10-CM | POA: Diagnosis not present

## 2022-02-07 DIAGNOSIS — L820 Inflamed seborrheic keratosis: Secondary | ICD-10-CM | POA: Diagnosis not present

## 2022-02-07 DIAGNOSIS — D485 Neoplasm of uncertain behavior of skin: Secondary | ICD-10-CM | POA: Diagnosis not present

## 2022-02-07 DIAGNOSIS — Z85828 Personal history of other malignant neoplasm of skin: Secondary | ICD-10-CM | POA: Diagnosis not present

## 2022-02-07 DIAGNOSIS — Z85820 Personal history of malignant melanoma of skin: Secondary | ICD-10-CM | POA: Diagnosis not present

## 2022-02-07 DIAGNOSIS — D2261 Melanocytic nevi of right upper limb, including shoulder: Secondary | ICD-10-CM | POA: Diagnosis not present

## 2022-02-07 DIAGNOSIS — D0461 Carcinoma in situ of skin of right upper limb, including shoulder: Secondary | ICD-10-CM | POA: Diagnosis not present

## 2022-02-09 ENCOUNTER — Ambulatory Visit: Payer: Self-pay

## 2022-02-09 NOTE — Telephone Encounter (Signed)
Made appt

## 2022-02-09 NOTE — Telephone Encounter (Signed)
Pt is requesting three rounds of medication fluconazole pt mentioned that her heart doctor is okay with her getting medication. Pt stated this is not a new symptom and has been experiencing it on and off for years.Pt mentioned she is requesting extra medication as she uses as necessary.   Pt she is having some symptoms of vaginal irritation and spots in the groin folds.Pt was last seen on 01/09/2022.   Pt mentioned she was seen at [facility] for a cat scratch.   Pt seeking clinical advice.     Chief Complaint: On antibiotic for cat scratch and has a vaginal yeast infection, "which happens a lot and Dr. Ancil Boozer sends me in 3 fluconazole tablets." Symptoms: Irritation, itching Frequency: 2 weeks ago Pertinent Negatives: Patient denies any discharge Disposition: '[]'$ ED /'[]'$ Urgent Care (no appt availability in office) / '[]'$ Appointment(In office/virtual)/ '[]'$  [place] [facility]/ '[]'$ Home Care/ '[]'$ Refused Recommended Disposition /'[]'$ [place] [facility]/ '[x]'$  Follow-up with PCP Additional Notes: Please advise pt.  Answer Assessment - Initial Assessment Questions 1. SYMPTOM: "What's the main symptom you're concerned about?" (e.g., pain, itching, dryness)     Irritation 2. LOCATION: "Where is the   located?" (e.g., inside/outside, left/right)     Inside and outside 3. ONSET: "When did the    start?"     2 weeks ago 4. PAIN: "Is there any pain?" If Yes, ask: "How bad is it?" (Scale: 1-10; mild, moderate, severe)   -  MILD (1-3): Doesn't interfere with normal activities.    -  MODERATE (4-7): Interferes with normal activities (e.g., work or school) or awakens from sleep.     -  SEVERE (8-10): Excruciating pain, unable to do any normal activities.     Moderate 5. ITCHING: "Is there any itching?" If Yes, ask: "How bad is it?" (Scale: 1-10; mild, moderate, severe)     Yes 6. CAUSE: "What do you think is causing the discharge?" "Have you had the same problem before? What happened then?"     Yeast 7. OTHER  SYMPTOMS: "Do you have any other symptoms?" (e.g., fever, itching, vaginal bleeding, pain with urination, injury to genital area, vaginal foreign body)     No 8. PREGNANCY: "Is there any chance you are pregnant?" "When was your last menstrual period?"     No  Protocols used: Vaginal Symptoms-A-AH

## 2022-02-12 ENCOUNTER — Ambulatory Visit (INDEPENDENT_AMBULATORY_CARE_PROVIDER_SITE_OTHER): Payer: Medicare Other | Admitting: Family Medicine

## 2022-02-12 ENCOUNTER — Encounter: Payer: Self-pay | Admitting: Family Medicine

## 2022-02-12 VITALS — BP 136/78 | HR 68 | Resp 16 | Ht 63.0 in | Wt 179.0 lb

## 2022-02-12 DIAGNOSIS — E113293 Type 2 diabetes mellitus with mild nonproliferative diabetic retinopathy without macular edema, bilateral: Secondary | ICD-10-CM | POA: Diagnosis not present

## 2022-02-12 DIAGNOSIS — W57XXXD Bitten or stung by nonvenomous insect and other nonvenomous arthropods, subsequent encounter: Secondary | ICD-10-CM | POA: Diagnosis not present

## 2022-02-12 DIAGNOSIS — I700 Atherosclerosis of aorta: Secondary | ICD-10-CM

## 2022-02-12 DIAGNOSIS — I480 Paroxysmal atrial fibrillation: Secondary | ICD-10-CM

## 2022-02-12 DIAGNOSIS — M810 Age-related osteoporosis without current pathological fracture: Secondary | ICD-10-CM | POA: Diagnosis not present

## 2022-02-12 DIAGNOSIS — E1169 Type 2 diabetes mellitus with other specified complication: Secondary | ICD-10-CM | POA: Diagnosis not present

## 2022-02-12 DIAGNOSIS — M62830 Muscle spasm of back: Secondary | ICD-10-CM

## 2022-02-12 DIAGNOSIS — B3731 Acute candidiasis of vulva and vagina: Secondary | ICD-10-CM

## 2022-02-12 DIAGNOSIS — I10 Essential (primary) hypertension: Secondary | ICD-10-CM | POA: Diagnosis not present

## 2022-02-12 DIAGNOSIS — I701 Atherosclerosis of renal artery: Secondary | ICD-10-CM | POA: Diagnosis not present

## 2022-02-12 DIAGNOSIS — E1159 Type 2 diabetes mellitus with other circulatory complications: Secondary | ICD-10-CM

## 2022-02-12 DIAGNOSIS — D692 Other nonthrombocytopenic purpura: Secondary | ICD-10-CM | POA: Diagnosis not present

## 2022-02-12 DIAGNOSIS — E785 Hyperlipidemia, unspecified: Secondary | ICD-10-CM

## 2022-02-12 DIAGNOSIS — L304 Erythema intertrigo: Secondary | ICD-10-CM

## 2022-02-12 DIAGNOSIS — R7989 Other specified abnormal findings of blood chemistry: Secondary | ICD-10-CM

## 2022-02-12 DIAGNOSIS — I152 Hypertension secondary to endocrine disorders: Secondary | ICD-10-CM

## 2022-02-12 MED ORDER — FLUCONAZOLE 150 MG PO TABS: 150.0000 mg | ORAL_TABLET | ORAL | 1 refills | Status: DC

## 2022-02-12 MED ORDER — CLOBETASOL PROPIONATE 0.05 % EX OINT: TOPICAL_OINTMENT | CUTANEOUS | 0 refills | Status: DC

## 2022-02-12 NOTE — Progress Notes (Signed)
Name: Bonnie Rowland   MRN: [id number]    DOB: [birth year]   Date:02/12/2022       Progress Note  Subjective  Chief Complaint  Follow Up  HPI  Osteoporosis: she was on Evista for years but bone density got worse she was on Reclast but bone density did not improve, she is currently getting Prolia given by Dr. Honor Junes . She denies side effects of medication . She  had repeat bone density done in March   DM: seeing Endocrinologist - Dr. Manfred Shirts  up to date with eye exam. She had A1C done at [facility] March 2023 was 6.7 %  She is on Bydureon, pre-meal insulin and basal insulin.  Denies polyphagia, polydipsia or polyuria Urine micro is up to date . Eye exam is up to date    HTN: BP is at goal. . She denies chest pain, palpitation, only has SOB with moderate activity  Yeast vaginitis and also intertrigo: flared recently due to antibiotic use due to cat scratch and also a foot infection, doing better since she took diflucan , discussed risk of QT prolongation with amiodarone but she states CHF clinic told her it was okay    Atherosclerosis of aorta: on statin therapy Last LDL was 43 ,  continue Crestor,   Senile purpura: both arms and hands. She takes Eliquis and when scratched she bleeds a lot    Obesity . She has DM, HTN, dyslipidemia, atherosclerosis of aorta. She is more active since she moved to [place], going to [employer], she also walks on a regular basis . Weight is stable since last visit , she is trying to lose more, goal for her is 170 lbs  Atrial Flibrilation/CHF:  she is under the care of Dr. Rockey Situ, last cardioversion worked but still on Eliquis , now on carvedilol  amiodarone. She was admitted July 2022 with respiratory failure and pulmonary edema, she has been compliant with medications, goes to CHF clinic . She states she has mild lower extremity edema that is stable, she has orthopnea and sleeps on her recliner - but part of it due to back pain   Diabetic foot  infection: seeing podiatrist, having a slow healing spot on left foot from recent surgery - had flexor tenotomy and is taking doxy and has regular follow ups, advised Tdap and will send rx to pharmacy   Back pain: she has been taking Tizanidine every night 2-4 mg prn at night for spasms   Elevated TSH: taking Amiodarone, monitored by Dr. Honor Junes, she has noticed hair is thinning. 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   Chronic pain 01/10/2015   History of melanoma excision 01/10/2015   Spinal stenosis, lumbar region, 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   DDD (degenerative disc disease), lumbar 11/14/2014   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   Hyperlipemia 11/07/2009   Hypertension, benign 11/07/2009   Atherosclerosis of renal artery ([place]) 11/07/2009   Perennial allergic rhinitis 11/27/2007  Lumbosacral neuritis 01/17/2007    Past Surgical History:  Procedure Laterality Date   APPENDECTOMY     CARDIAC CATHETERIZATION     CARDIOVERSION N/A 11/30/2020   Procedure: CARDIOVERSION;  Surgeon: Minna Merritts, MD;  Location: [facility];  Service: Cardiovascular;  Laterality: N/A;   CARDIOVERSION N/A 01/20/2021   Procedure: CARDIOVERSION;  Surgeon: Minna Merritts, MD;  Location: [facility];  Service: Cardiovascular;  Laterality: N/A;   CARDIOVERSION N/A 01/30/2021   Procedure: CARDIOVERSION;  Surgeon: Wellington Hampshire,  MD;  Location: [facility];  Service: Cardiovascular;  Laterality: N/A;   cataract surgery     COLONOSCOPY     COLONOSCOPY WITH PROPOFOL N/A 05/09/2016   Procedure: COLONOSCOPY WITH PROPOFOL;  Surgeon: Robert Bellow, MD;  Location: [facility];  Service: Endoscopy;  Laterality: N/A;   DIAGNOSTIC MAMMOGRAM     EYE SURGERY Bilateral    Cataract Extraction   JOINT REPLACEMENT  2016   KNEE ARTHROPLASTY Right 03/30/2015   Procedure: COMPUTER ASSISTED TOTAL KNEE ARTHROPLASTY;  Surgeon: Dereck Leep, MD;  Location: [facility];  Service: Orthopedics;  Laterality: Right;   KNEE ARTHROSCOPY Right    KNEE CLOSED REDUCTION Right 05/23/2015   Procedure: CLOSED MANIPULATION KNEE;  Surgeon: Dereck Leep, MD;  Location: [facility];  Service: Orthopedics;  Laterality: Right;   RIGHT/LEFT HEART CATH AND CORONARY ANGIOGRAPHY N/A 01/25/2021   Procedure: RIGHT/LEFT HEART CATH AND CORONARY ANGIOGRAPHY;  Surgeon: Nelva Bush, MD;  Location: [facility];  Service: Cardiovascular;  Laterality: N/A;   TONSILLECTOMY      Family History  Problem Relation Age of Onset   Diabetes Mother    Hypertension Mother    Cancer Mother    Kidney disease Mother    Hypertension Father    Cancer Father        Prostate   Breast cancer Maternal Aunt 71   Colon cancer Son    Kidney cancer Neg Hx    Bladder Cancer Neg Hx     Social History   Tobacco Use   Smoking status: Never   Smokeless tobacco: Never   Tobacco comments:    Smoking cessation materials not required  Substance Use Topics   Alcohol use: Yes    Alcohol/week: 1.0 standard drink of alcohol    Types: 1 Glasses of wine per week    Comment: RARELY     Current Outpatient Medications:    acetaminophen (TYLENOL) 650 MG CR tablet, Take 1,300 mg by mouth at bedtime., Disp: , Rfl:    ALFALFA PO, Take 1 tablet by mouth daily., Disp: , Rfl:    amiodarone (PACERONE) 200 MG tablet, Take 0.5 tablets (100 mg total) by mouth daily., Disp: 90 tablet,  Rfl: 3   ascorbic Acid (VITAMIN C) 500 MG CPCR, Take 500 mg by mouth daily., Disp: , Rfl:    B Complex-C (B-COMPLEX WITH VITAMIN C) tablet, Take 1 tablet by mouth 2 (two) times daily., Disp: , Rfl:    BD PEN NEEDLE NANO U/F 32G X 4 MM MISC, USE 4 TIMES DAILY (HUMALOG 3 TIMES DAILY AND LANTUS ONCE DAILY) OFFICE NOTIFIED 08/06/17, Disp: 90 each, Rfl: 1   BLACK ELDERBERRY,BERRY-FLOWER, PO, Take 15 mLs by mouth daily. Liquid, Disp: , Rfl:    Calcium Carbonate (CALCIUM 600 PO), Take 600 mg by mouth daily., Disp: , Rfl:    carvedilol (COREG) 3.125 MG tablet, TAKE 1 TABLET BY MOUTH 2 TIMES DAILY., Disp: 180 tablet, Rfl: 2   cholecalciferol (  VITAMIN D) 1000 UNITS tablet, Take 1,000 Units by mouth daily. [place], Disp: , Rfl:    clobetasol ointment (TEMOVATE) 1.63 %, APPLY 1 APPLICATION TOPICALLY 2 TIMES DAILY AS NEEDED (BUG BITES)., Disp: 30 g, Rfl: 0   Coenzyme Q10 (COQ10) 200 MG CAPS, Take 200 mg by mouth daily., Disp: , Rfl:    Continuous Blood Gluc Sensor ([place]) MISC, by Does not apply route., Disp: , Rfl:    doxycycline (VIBRA-TABS) 100 MG tablet, Take 1 tablet (100 mg total) by mouth 2 (two) times daily., Disp: 14 tablet, Rfl: 0   doxycycline (VIBRA-TABS) 100 MG tablet, Take 1 tablet (100 mg total) by mouth 2 (two) times daily., Disp: 20 tablet, Rfl: 0   Elastic Bandages & Supports ([place]) MISC, 1 each by Does not apply route daily., Disp: 2 each, Rfl: 5   ELIQUIS 5 MG TABS tablet, TAKE 1 TABLET BY MOUTH TWICE A DAY, Disp: 180 tablet, Rfl: 1   estradiol (ESTRACE VAGINAL) 0.1 MG/GM vaginal cream, 1/2 gm once weekly using applicator, apply blueberry sized amount of cream using tip of finger to urethra twice weekly, Disp: 30 g, Rfl: 3   Exenatide ER (BYDUREON BCISE) 2 MG/0.85ML AUIJ, Inject 2 mg into the skin every Sunday., Disp: , Rfl:    furosemide (LASIX) 40 MG tablet, TAKE 1 TABLET BY MOUTH TWICE A DAY, Disp: 180 tablet, Rfl: 3   Glucosamine 500 MG  TABS, Take 500 mg by mouth daily., Disp: , Rfl:    HUMALOG KWIKPEN 100 UNIT/ML KiwkPen, Inject 5-10 Units into the skin 3 (three) times daily before meals. If needed sliding scale, Disp: , Rfl: 3   insulin glargine (LANTUS) 100 unit/mL SOPN, Inject 16-22 Units into the skin at bedtime., Disp: , Rfl:    Krill Oil 1000 MG CAPS, Take 1,000 mg by mouth daily., Disp: , Rfl:    Mag Aspart-Potassium Aspart (POTASSIUM & MAGNESIUM ASPARTAT PO), Take 1 tablet by mouth daily., Disp: , Rfl:    miconazole (MICONAZOLE 3) 200 MG vaginal suppository, Place 1 suppository (200 mg total) vaginally at bedtime., Disp: 3 suppository, Rfl: 0   Multiple Vitamins-Minerals (PRESERVISION AREDS 2) CAPS, Take 1 capsule by mouth 2 (two) times daily., Disp: , Rfl:    mupirocin ointment (BACTROBAN) 2 %, Apply 1 Application topically 2 (two) times daily., Disp: 22 g, Rfl: 0   OVER THE COUNTER MEDICATION, Place 1 application into both eyes See admin instructions. Blephadex eyelid foam, apply to eyelids once daily, Disp: , Rfl:    OVER THE COUNTER MEDICATION, Take 1 tablet by mouth daily. Vita-Lea Gold otc supplement With out vitamin K ([place] products), Disp: , Rfl:    OVER THE COUNTER MEDICATION, Take 1 tablet by mouth See admin instructions. Nutriferon otc supplement take Take 1 tablet on Sun, Tues, Thurs, Sat. Take 1 tablet twice daily on Mon, Wed, and Fri, Disp: , Rfl:    Probiotic Product (PROBIOTIC PO), Take 1 capsule by mouth at bedtime., Disp: , Rfl:    PROLIA 60 MG/ML SOSY injection, Inject 60 mg into the skin every 6 (six) months., Disp: , Rfl:    Propylene Glycol (SYSTANE BALANCE OP), Place 1 drop into both eyes daily as needed (dry eyes)., Disp: , Rfl:    rosuvastatin (CRESTOR) 10 MG tablet, TAKE 1 TABLET (10 MG TOTAL) BY MOUTH 4 (FOUR) TIMES A WEEK., Disp: 48 tablet, Rfl: 1   sacubitril-valsartan (ENTRESTO) 49-51 MG, Take 1 tablet by mouth 2 (two) times  daily., Disp: 180 tablet, Rfl: 3   Sodium Chloride-Sodium Bicarb  (NETI POT SINUS WASH NA), Place 1 Dose into the nose at bedtime., Disp: , Rfl:    tiZANidine (ZANAFLEX) 2 MG tablet, TAKE 1-2 TABLETS (2-4 MG TOTAL) BY MOUTH AT BEDTIME., Disp: 110 tablet, Rfl: 0   Vitamin D, Ergocalciferol, (DRISDOL) 1.25 MG (50000 UT) CAPS capsule, Take 50,000 Units by mouth every Saturday., Disp: , Rfl:   Allergies  Allergen Reactions   Cyclobenzaprine Hypertension   Cheese Other (See Comments)    bloating   Ciprofloxacin Hcl Other (See Comments)    Muscle pain   Coconut (Cocos Nucifera)     Upset stomach   Keflex [Cephalexin] Other (See Comments)    Patient prefers not to take this medication due to the side effects, caused tendon issues    I personally reviewed active problem list, medication list, allergies, family history, social history, health maintenance with the patient/caregiver today.   ROS  Constitutional: Negative for fever or weight change.  Respiratory: Negative for cough , positive only when moderate  shortness of breath.   Cardiovascular: Negative for chest pain or palpitations.  Gastrointestinal: Negative for abdominal pain, no bowel changes.  Musculoskeletal: positive for gait problem but no  joint swelling.  Skin: Negative for rash.  Neurological: Negative for dizziness or headache.  No other specific complaints in a complete review of systems (except as listed in HPI above).   Objective  Vitals:   02/12/22 0912  BP: 136/78  Pulse: 68  Resp: 16  SpO2: 97%  Weight: 179 lb (81.2 kg)  Height: 5' 3"  (1.6 m)    Body mass index is 31.71 kg/m.  Physical Exam  Constitutional: Patient appears well-developed and well-nourished. Obese  No distress.  HEENT: head atraumatic, normocephalic, pupils equal and reactive to light, neck supple Cardiovascular: Normal rate, regular rhythm and normal heart sounds.  No murmur heard. Trace  BLE edema. Pulmonary/Chest: Effort normal and breath sounds normal. No respiratory distress. Abdominal: Soft.   There is no tenderness. Psychiatric: Patient has a normal mood and affect. behavior is normal. Judgment and thought content normal.   Recent Results (from the past 2160 hour(s))  Urine Microalbumin w/creat. ratio     Status: None   Collection Time: 01/09/22  8:49 AM  Result Value Ref Range   Creatinine, Urine 22 20 - 275 mg/dL   Microalb, Ur <0.2 mg/dL    Comment: Reference Range Not established    Microalb Creat Ratio NOTE <30 mcg/mg creat    Comment: NOTE: The urine albumin value is less than  0.2 mg/dL therefore we are unable to calculate  excretion and/or creatinine ratio. . The ADA defines abnormalities in albumin excretion as follows: Marland Kitchen Albuminuria Category        Result (mcg/mg creatinine) . Normal to Mildly increased   <30 Moderately increased         30-299  Severely increased           > OR = 300 . The ADA recommends that at least two of three specimens collected within a 3-6 month period be abnormal before considering a patient to be within a diagnostic category.   Thyroid Panel With TSH     Status: Abnormal   Collection Time: 01/09/22  8:49 AM  Result Value Ref Range   T3 Uptake 32 22 - 35 %   T4, Total 6.5 5.1 - 11.9 mcg/dL   Free Thyroxine Index 2.1 1.4 - 3.8  TSH 6.13 (H) 0.40 - 4.50 mIU/L  Basic metabolic panel     Status: Abnormal   Collection Time: 01/09/22  8:49 AM  Result Value Ref Range   Glucose, Bld 142 (H) 65 - 99 mg/dL    Comment: .            Fasting reference interval . For someone without known diabetes, a glucose value >125 mg/dL indicates that they may have diabetes and this should be confirmed with a follow-up test. .    BUN 19 7 - 25 mg/dL   Creat 0.95 0.60 - 1.00 mg/dL   BUN/Creatinine Ratio NOT APPLICABLE 6 - 22 (calc)   Sodium 141 135 - 146 mmol/L   Potassium 4.2 3.5 - 5.3 mmol/L   Chloride 103 98 - 110 mmol/L   CO2 31 20 - 32 mmol/L   Calcium 8.8 8.6 - 10.4 mg/dL    PHQ2/9:    02/12/2022    9:11 AM 01/09/2022    8:02  AM 08/29/2021    9:46 AM 07/12/2021    7:56 AM 05/09/2021    9:44 AM  Depression screen PHQ 2/9  Decreased Interest 0 0 0 0 0  Down, Depressed, Hopeless 0 0 0 0 0  PHQ - 2 Score 0 0 0 0 0  Altered sleeping 0 0  0   Tired, decreased energy 0 0  0   Change in appetite 0 0  0   Feeling bad or failure about yourself  0 0  0   Trouble concentrating 0 0  0   Moving slowly or fidgety/restless 0 0  0   Suicidal thoughts 0 0  0   PHQ-9 Score 0 0  0     phq 9 is negative   Fall Risk:    02/12/2022    9:11 AM 01/09/2022    8:02 AM 12/27/2021    9:52 AM 09/26/2021   10:03 AM 08/29/2021    9:46 AM  Fall Risk   Falls in the past year? 0 0 0 0 0  Number falls in past yr: 0 0 0 0 0  Injury with Fall? 0 0 0 0 0  Risk for fall due to : No Fall Risks No Fall Risks   Impaired mobility  Follow up Falls prevention discussed Falls prevention discussed Falls evaluation completed Falls evaluation completed Falls evaluation completed;Falls prevention discussed      Functional Status Survey: Is the patient deaf or have difficulty hearing?: Yes Does the patient have difficulty seeing, even when wearing glasses/contacts?: Yes Does the patient have difficulty concentrating, remembering, or making decisions?: No Does the patient have difficulty walking or climbing stairs?: No Does the patient have difficulty dressing or bathing?: No Does the patient have difficulty doing errands alone such as visiting a doctor's office or shopping?: No    Assessment & Plan  1. 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([place])  Reassurance given   2. Atherosclerosis of abdominal aorta (HCC)  Continue statin therapy   3. AF (paroxysmal atrial fibrillation) ([place])  Not having symptoms  4. Atherosclerosis of renal artery ([place])   5. Dyslipidemia associated with type 2 diabetes mellitus ([place])   6. Hypertension associated with type 2 diabetes mellitus (HCC)   7. Elevated TSH  Followed by Dr . Honor Junes  8. Hypertension, benign  At  goal   9. Age-related osteoporosis without current pathological fracture   10. 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 Intertrigo  - fluconazole (  DIFLUCAN) 150 MG tablet; Take 1 tablet (150 mg total) by mouth every other day.  Dispense: 9 tablet; Refill: 1  13. Bug bite, subsequent encounter  - clobetasol ointment (TEMOVATE) 1.51 %; APPLY 1 APPLICATION TOPICALLY 2 TIMES DAILY AS NEEDED (BUG BITES).  Dispense: 30 g; Refill: 0  14. Yeast vaginitis  - fluconazole (DIFLUCAN) 150 MG tablet; Take 1 tablet (150 mg total) by mouth every other day.  Dispense: 9 tablet; Refill: 1

## 2022-02-19 DIAGNOSIS — M9903 Segmental and somatic dysfunction of lumbar region: Secondary | ICD-10-CM | POA: Diagnosis not present

## 2022-02-19 DIAGNOSIS — M9901 Segmental and somatic dysfunction of cervical region: Secondary | ICD-10-CM | POA: Diagnosis not present

## 2022-02-19 DIAGNOSIS — M5416 Radiculopathy, lumbar region: Secondary | ICD-10-CM | POA: Diagnosis not present

## 2022-02-19 DIAGNOSIS — M5033 Other cervical disc degeneration, cervicothoracic region: Secondary | ICD-10-CM | POA: Diagnosis not present

## 2022-02-20 ENCOUNTER — Other Ambulatory Visit: Payer: Self-pay | Admitting: Family Medicine

## 2022-02-20 DIAGNOSIS — M62830 Muscle spasm of back: Secondary | ICD-10-CM

## 2022-02-21 ENCOUNTER — Other Ambulatory Visit: Payer: Self-pay | Admitting: Cardiovascular Disease

## 2022-03-07 DIAGNOSIS — M9901 Segmental and somatic dysfunction of cervical region: Secondary | ICD-10-CM | POA: Diagnosis not present

## 2022-03-07 DIAGNOSIS — M9903 Segmental and somatic dysfunction of lumbar region: Secondary | ICD-10-CM | POA: Diagnosis not present

## 2022-03-07 DIAGNOSIS — M5033 Other cervical disc degeneration, cervicothoracic region: Secondary | ICD-10-CM | POA: Diagnosis not present

## 2022-03-07 DIAGNOSIS — M5416 Radiculopathy, lumbar region: Secondary | ICD-10-CM | POA: Diagnosis not present

## 2022-03-08 ENCOUNTER — Encounter: Payer: Self-pay | Admitting: Podiatry

## 2022-03-08 ENCOUNTER — Ambulatory Visit (INDEPENDENT_AMBULATORY_CARE_PROVIDER_SITE_OTHER): Payer: Medicare Other | Admitting: Podiatry

## 2022-03-08 DIAGNOSIS — S9031XA Contusion of right foot, initial encounter: Secondary | ICD-10-CM

## 2022-03-08 DIAGNOSIS — I701 Atherosclerosis of renal artery: Secondary | ICD-10-CM

## 2022-03-08 DIAGNOSIS — S9030XA Contusion of unspecified foot, initial encounter: Secondary | ICD-10-CM | POA: Insufficient documentation

## 2022-03-08 NOTE — Progress Notes (Signed)
This patient presents to the office saying she has developed pain on the outside of her right midfoot.  She has been walking for exercise and her pain is worsening.  She says she has painful callus.  She presents for evaluation and treatment.  General Appearance  Alert, conversant and in no acute stress.  Vascular  Dorsalis pedis and posterior tibial  pulses are palpable  bilaterally.  Capillary return is within normal limits  bilaterally. Temperature is within normal limits  bilaterally.  Neurologic  Senn-Weinstein monofilament wire test within normal limits  bilaterally. Muscle power within normal limits bilaterally.  Nails Thick disfigured discolored nails with subungual debris  from hallux to fifth toes bilaterally. No evidence of bacterial infection or drainage bilaterally.  Orthopedic  No limitations of motion  feet .  No crepitus or effusions noted.  No bony pathology or digital deformities noted.  Palpable pain fifth metabase right foot.  Skin  normotropic skin with no porokeratosis noted bilaterally.  No signs of infections or ulcers noted.     Contusion right foot.  ROV.  Discussed this condition with this patient.  Examined her shoes and they were not stiff enough.  Also applied padding to her right shoe.  She says there was dramatic improvement upon leaving.  RTC as scheduled.  [name] DPM

## 2022-03-09 DIAGNOSIS — D0461 Carcinoma in situ of skin of right upper limb, including shoulder: Secondary | ICD-10-CM | POA: Diagnosis not present

## 2022-03-13 ENCOUNTER — Other Ambulatory Visit: Payer: Self-pay | Admitting: Cardiovascular Disease

## 2022-03-13 DIAGNOSIS — I480 Paroxysmal atrial fibrillation: Secondary | ICD-10-CM

## 2022-03-13 NOTE — Telephone Encounter (Signed)
Eliquis '5mg'$  refill request received. Patient is 81 years old, weight-81.2kg, Crea-0.95 on 01/09/2022, Diagnosis-Afib, and last seen by [name], NP on 12/27/2021 & Dr. Rockey Situ on 07/18/2021. Dose is appropriate based on dosing criteria. Will send in refill to requested pharmacy.

## 2022-03-18 ENCOUNTER — Other Ambulatory Visit: Payer: Self-pay | Admitting: Family Medicine

## 2022-03-18 DIAGNOSIS — E1169 Type 2 diabetes mellitus with other specified complication: Secondary | ICD-10-CM

## 2022-03-18 DIAGNOSIS — I701 Atherosclerosis of renal artery: Secondary | ICD-10-CM

## 2022-03-21 ENCOUNTER — Ambulatory Visit (INDEPENDENT_AMBULATORY_CARE_PROVIDER_SITE_OTHER): Payer: Medicare Other

## 2022-03-21 DIAGNOSIS — E1169 Type 2 diabetes mellitus with other specified complication: Secondary | ICD-10-CM

## 2022-03-21 DIAGNOSIS — I10 Essential (primary) hypertension: Secondary | ICD-10-CM

## 2022-03-21 NOTE — Progress Notes (Signed)
Chronic Care Management Pharmacy Note  03/30/2022 Name:  Bonnie Rowland MRN:  [id number] DOB:  August 18, 1940  Summary: Patient presents for CCM follow-up.  Recommendations/Changes made from today's visit: Continue current medications   Plan: CPP follow-up in 3 months   Subjective: Bonnie Rowland is an 81 y.o. year old female who is a primary patient of Steele Sizer, MD.  The CCM team was consulted for assistance with disease management and care coordination needs.    Engaged with patient by telephone for follow up visit in response to provider referral for pharmacy case management and/or care coordination services.   Consent to Services:  The patient was given information about Chronic Care Management services, agreed to services, and gave verbal consent prior to initiation of services.  Please see initial visit note for detailed documentation.   Patient Care Team: Steele Sizer, MD as PCP - General (Family Medicine) Minna Merritts, MD as Consulting Physician (Cardiology) Dasher, Rayvon Char, MD as Consulting Physician (Dermatology) Estill Cotta, MD as Consulting Physician (Ophthalmology) Lonia Farber, MD as Consulting Physician (Internal Medicine) Beverly Gust, MD as Consulting Physician (Otolaryngology) Iris Pert, [place] as Referring Physician (Chiropractic Medicine) Germaine Pomfret, [facility] as Pharmacist (Pharmacist)  Recent office visits: 01/09/22: Patient presented to Dr. Ky Barban for follow-up.  07/12/21: Patient presented to Dr. Ancil Boozer for follow-up.   Recent consult visits: 09/26/21: Patient presented to [name], [place] (Cardiology) for Follow-up. Dizziness with Entresto improved.   Hospital visits: 01/19/22: Patient presented to ED for skin tear  01/23/22: Patient presented to ED for cellulitis   Objective:  Lab Results  Component Value Date   CREATININE 0.95 01/09/2022   BUN 19 01/09/2022   GFRNONAA >60 03/13/2021   GFRAA 102  04/11/2018   NA 141 01/09/2022   K 4.2 01/09/2022   CALCIUM 8.8 01/09/2022   CO2 31 01/09/2022   GLUCOSE 142 (H) 01/09/2022    Lab Results  Component Value Date/Time   HGBA1C 6.7 09/21/2021 12:00 AM   HGBA1C 7.1 03/23/2021 12:00 AM   MICROALBUR <0.2 01/09/2022 08:49 AM   MICROALBUR Negative 08/21/2019 12:00 AM   MICROALBUR 0 02/18/2017 09:01 AM   MICROALBUR 50 04/23/2016 10:37 AM    Last diabetic Eye exam:  Lab Results  Component Value Date/Time   HMDIABEYEEXA Retinopathy (A) 01/31/2022 12:00 AM    Last diabetic Foot exam: No results found for: "HMDIABFOOTEX"   Lab Results  Component Value Date   CHOL 127 07/12/2021   HDL 70 07/12/2021   LDLCALC 43 07/12/2021   TRIG 59 07/12/2021   CHOLHDL 1.8 07/12/2021       Latest Ref Rng & Units 07/12/2021    8:44 AM 04/12/2021    1:54 PM 01/23/2021    4:13 AM  Hepatic Function  Total Protein 6.1 - 8.1 g/dL 6.4  6.9  7.1   Albumin 3.7 - 4.7 g/dL  4.3  4.0   AST 10 - 35 U/L 35  31  42   ALT 6 - 29 U/L 23  20  34   Alk Phosphatase 44 - 121 IU/L  78  81   Total Bilirubin 0.2 - 1.2 mg/dL 0.8  0.7  1.6     Lab Results  Component Value Date/Time   TSH 6.13 (H) 01/09/2022 08:49 AM   TSH 8.69 (H) 07/12/2021 08:44 AM   FREET4 1.53 04/25/2021 10:32 AM       Latest Ref Rng & Units 07/12/2021    8:44 AM 01/31/2021  7:03 AM 01/30/2021    6:24 AM  CBC  WBC 3.8 - 10.8 Thousand/uL 4.9  8.4  9.4   Hemoglobin 11.7 - 15.5 g/dL 13.0  13.9  14.2   Hematocrit 35.0 - 45.0 % 39.3  41.1  41.1   Platelets 140 - 400 Thousand/uL 181  285  275     Lab Results  Component Value Date/Time   VD25OH 28 (L) 08/22/2017 09:35 AM    Clinical ASCVD: No  The ASCVD Risk score (Arnett DK, et al., 2019) failed to calculate for the following reasons:   The valid total cholesterol range is 130 to 320 mg/dL       02/12/2022    9:11 AM 01/09/2022    8:02 AM 08/29/2021    9:46 AM  Depression screen PHQ 2/9  Decreased Interest 0 0 0  Down, Depressed,  Hopeless 0 0 0  PHQ - 2 Score 0 0 0  Altered sleeping 0 0   Tired, decreased energy 0 0   Change in appetite 0 0   Feeling bad or failure about yourself  0 0   Trouble concentrating 0 0   Moving slowly or fidgety/restless 0 0   Suicidal thoughts 0 0   PHQ-9 Score 0 0     -Last DEXA Scan: 09/21/21  T-Score femoral neck: -2.3  T-Score total hip: -1.2  T-Score lumbar spine: +0.8  T-Score forearm radius: -2.9    Social History   Tobacco Use  Smoking Status Never  Smokeless Tobacco Never  Tobacco Comments   Smoking cessation materials not required   BP Readings from Last 3 Encounters:  02/12/22 136/78  01/23/22 (!) 166/71  01/19/22 (!) 152/70   Pulse Readings from Last 3 Encounters:  02/12/22 68  01/23/22 64  01/19/22 (!) 54   Wt Readings from Last 3 Encounters:  02/12/22 179 lb (81.2 kg)  01/09/22 173 lb (78.5 kg)  12/27/21 177 lb 2 oz (80.3 kg)   BMI Readings from Last 3 Encounters:  02/12/22 31.71 kg/m  01/09/22 30.65 kg/m  12/27/21 29.48 kg/m    Assessment/Interventions: Review of patient past medical history, allergies, medications, health status, including review of consultants reports, laboratory and other test data, was performed as part of comprehensive evaluation and provision of chronic care management services.   SDOH:  (Social Determinants of Health) assessments and interventions performed: Yes SDOH Interventions    Flowsheet Row Chronic Care Management from 12/20/2021 in [place] from 04/20/2021 in [place] [facility] from 02/09/2021 in [facility]  SDOH Interventions     Food Insecurity Interventions -- Intervention Not Indicated --  Housing Interventions -- Intervention Not Indicated --  Transportation Interventions Intervention Not Indicated Intervention Not Indicated --  Financial Strain Interventions Intervention Not Indicated Intervention Not  Indicated Intervention Not Indicated  Physical Activity Interventions -- Intervention Not Indicated --  Stress Interventions -- Intervention Not Indicated --  Social Connections Interventions -- Intervention Not Indicated --       SDOH Screenings   Food Insecurity: No Food Insecurity (04/20/2021)  Housing: Low Risk  (04/20/2021)  Transportation Needs: No Transportation Needs (12/21/2021)  Alcohol Screen: Low Risk  (04/20/2021)  Depression (PHQ2-9): Low Risk  (02/12/2022)  Financial Resource Strain: Low Risk  (12/21/2021)  Physical Activity: Sufficiently Active (04/20/2021)  Social Connections: Socially Integrated (04/20/2021)  Stress: No Stress Concern Present (04/20/2021)  Tobacco Use: Low Risk  (03/08/2022)    [place]  Allergies  Allergen Reactions   Cyclobenzaprine Hypertension   Cheese Other (See Comments)    bloating   Ciprofloxacin Hcl Other (See Comments)    Muscle pain   Coconut (Cocos Nucifera)     Upset stomach   Keflex [Cephalexin] Other (See Comments)    Patient prefers not to take this medication due to the side effects, caused tendon issues    Medications Reviewed Today     Reviewed by [name], DPM (Physician) on 03/08/22 at 1138  Med List Status: <None>   Medication Order Taking? Sig Documenting Provider Last Dose Status Informant  acetaminophen (TYLENOL) 650 MG CR tablet [id number] Yes Take 1,300 mg by mouth at bedtime. [provider] Taking Active Self  ALFALFA PO [id number] Yes Take 1 tablet by mouth daily. [provider] Taking Active Self  amiodarone (PACERONE) 200 MG tablet [id number] Yes Take 0.5 tablets (100 mg total) by mouth daily. Rise Mu, [name] Taking Active   ascorbic Acid (VITAMIN C) 500 MG CPCR [id number] Yes Take 500 mg by mouth daily. [provider] Taking Active Self  B Complex-C (B-COMPLEX WITH VITAMIN C) tablet [id number] Yes Take 1 tablet by mouth 2 (two) times daily. [provider] Taking  Active Self  BD PEN NEEDLE NANO U/F 32G X 4 MM MISC [id number] Yes USE 4 TIMES DAILY (HUMALOG 3 TIMES DAILY AND LANTUS ONCE DAILY) OFFICE NOTIFIED 08/06/17 Steele Sizer, MD Taking Active Self  BLACK ELDERBERRY,BERRY-FLOWER, PO [id number] Yes Take 15 mLs by mouth daily. Liquid [provider] Taking Active Self  Calcium Carbonate (CALCIUM 600 PO) [id number] Yes Take 600 mg by mouth daily. [provider] Taking Active Self  carvedilol (COREG) 3.125 MG tablet [id number] Yes TAKE 1 TABLET BY MOUTH 2 TIMES DAILY. Minna Merritts, MD Taking Active   cholecalciferol (VITAMIN D) 1000 UNITS tablet [id number] Yes Take 1,000 Units by mouth daily. [place] [provider] Taking Active Self  clobetasol ointment (TEMOVATE) 0.05 % [id number] Yes APPLY 1 APPLICATION TOPICALLY 2 TIMES DAILY AS NEEDED (BUG BITES). Steele Sizer, MD Taking Active   Coenzyme Q10 (COQ10) 200 MG CAPS [id number] Yes Take 200 mg by mouth daily. [provider] Taking Active Self  Continuous Blood Gluc Sensor ([place]) [place] [id number] Yes by Does not apply route. [provider] Taking Active Self    Discontinued 01/16/22 1722 (Reorder)   Elastic Bandages & Supports ([place]) [place] [id number] Yes 1 each by Does not apply route daily. Steele Sizer, MD Taking Active Self  ELIQUIS 5 MG TABS tablet [id number] Yes TAKE 1 TABLET BY MOUTH TWICE A DAY Gollan, Kathlene November, MD Taking Active   estradiol (ESTRACE VAGINAL) 0.1 MG/GM vaginal cream [id number] Yes 1/2 gm once weekly using applicator, apply blueberry sized amount of cream using tip of finger to urethra twice weekly Zara Council A, [name] Taking Active   Exenatide ER (BYDUREON BCISE) 2 MG/0.85ML AUIJ [id number] Yes Inject 2 mg into the skin every Sunday. [provider] Taking Active Self  fluconazole (DIFLUCAN) 150 MG tablet [id number] Yes Take 1 tablet (150 mg total) by mouth every other day. Steele Sizer, MD Taking Active   furosemide (LASIX) 40 MG tablet [id number] Yes TAKE 1 TABLET BY MOUTH TWICE A DAY Alisa Graff, FNP Taking Active   Glucosamine 500 MG TABS [id number] Yes Take 500 mg by mouth daily. [provider] Taking Active   HUMALOG KWIKPEN 100 UNIT/ML Mayer Masker [id number] Yes Inject 5-10 Units into the skin  3 (three) times daily before meals. If needed sliding scale [provider] Taking Active   insulin glargine (LANTUS) 100 unit/mL SOPN [id number] Yes Inject 16-22 Units into the skin at bedtime. [provider] Taking Active Self  Krill Oil 1000 MG CAPS [id number] Yes Take 1,000 mg by mouth daily. [provider] Taking Active Self  Mag Aspart-Potassium Aspart (POTASSIUM & MAGNESIUM ASPARTAT PO) [id number] Yes Take 1 tablet by mouth daily. [provider] Taking Active Self  miconazole (MICONAZOLE 3) 200 MG vaginal suppository [id number] Yes Place 1 suppository (200 mg total) vaginally at bedtime. Scot Jun, FNP Taking Active   Multiple Vitamins-Minerals (PRESERVISION AREDS 2) CAPS [id number] Yes Take 1 capsule by mouth 2 (two) times daily. [provider] Taking Active Self  mupirocin ointment (BACTROBAN) 2 % [id number] Yes Apply 1 Application topically 2 (two) times daily. Sharion Balloon, NP Taking Active   OVER THE COUNTER MEDICATION [id number] Yes Place 1 application into both eyes See admin instructions. Blephadex eyelid foam, apply to eyelids once daily [provider] Taking Active Self  OVER THE COUNTER MEDICATION [id number] Yes Take 1 tablet by mouth daily. Vita-Lea Gold otc supplement With out vitamin K ([place] products) [provider] Taking Active   OVER THE COUNTER MEDICATION [id number] Yes Take 1 tablet by mouth See admin instructions. Nutriferon otc supplement take Take 1 tablet on Sun, Tues, Thurs, Sat. Take 1 tablet twice daily on Mon, Wed, and Fri [provider] Taking Active Self   Probiotic Product (PROBIOTIC PO) [id number] Yes Take 1 capsule by mouth at bedtime. [provider] Taking Active Self  PROLIA 60 MG/ML SOSY injection [id number] Yes Inject 60 mg into the skin every 6 (six) months. [provider] Taking Active Self  Propylene Glycol (SYSTANE BALANCE OP) [id number] Yes Place 1 drop into both eyes daily as needed (dry eyes). [provider] Taking Active Self  rosuvastatin (CRESTOR) 10 MG tablet [id number] Yes TAKE 1 TABLET (10 MG TOTAL) BY MOUTH 4 (FOUR) TIMES A WEEK. Steele Sizer, MD Taking Active            Med Note [facility], RAQUEL   Wed Dec 27, 2021 10:07 AM) Taking every day  sacubitril-valsartan (ENTRESTO) 49-51 MG [id number] Yes Take 1 tablet by mouth 2 (two) times daily. Alisa Graff, FNP Taking Active   Sodium Chloride-Sodium Bicarb (NETI POT SINUS [place] NA) [id number] Yes Place 1 Dose into the nose at bedtime. [provider] Taking Active Self  tiZANidine (ZANAFLEX) 2 MG tablet [id number] Yes TAKE 1-2 TABLETS (2-4 MG TOTAL) BY MOUTH AT BEDTIME. Steele Sizer, MD Taking Active   Vitamin D, Ergocalciferol, (DRISDOL) 1.25 MG (50000 UT) CAPS capsule [id number] Yes Take 50,000 Units by mouth every Saturday. [provider] Taking Active Self  Med List Note Lona Millard, RN 01/13/15 1037): No UDS needed No meds prescribed by [facility]            Patient Active Problem List   Diagnosis Date Noted   Contusion of foot 03/08/2022   Pain due to onychomycosis of toenails of both feet 09/07/2021   Chronic combined systolic and diastolic congestive heart failure (HCC)    AF (paroxysmal atrial fibrillation) ([place]) 01/23/2021   Type 2 diabetes mellitus with hyperlipidemia ([place]) 01/23/2021   Plantar fasciitis, bilateral 05/21/2019   Age-related osteoporosis without current pathological fracture 11/05/2017   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([place]) 07/09/2017   Atherosclerosis of abdominal aorta ([place]) 07/09/2017   Bilateral  carotid bruits 08/24/2015  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   Chronic pain 01/10/2015   History of melanoma excision 01/10/2015   Spinal stenosis, lumbar region, 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   DDD (degenerative disc disease), lumbar 11/14/2014   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   Hyperlipemia 11/07/2009   Hypertension, benign 11/07/2009   Atherosclerosis of renal artery ([place]) 11/07/2009   Perennial allergic rhinitis 11/27/2007   Lumbosacral neuritis 01/17/2007    Immunization History  Administered Date(s) Administered   Fluad Quad(high Dose 65+) 03/19/2019, 04/20/2020   Influenza, High Dose Seasonal PF 03/10/2015, 03/02/2016, 03/12/2017, 04/07/2018, 04/13/2021   Moderna Sars-Covid-2 Vaccination 07/14/2019, 08/11/2019, 05/17/2020, 11/17/2020, 03/17/2021   Pneumococcal Conjugate-13 07/13/2013   Pneumococcal Polysaccharide-23 03/31/2010   Tdap 03/07/2012, 01/19/2022   Zoster Recombinat (Shingrix) 02/02/2017, 04/05/2017   Zoster, Live 06/01/2010    Conditions to be addressed/monitored:  Hypertension, Hyperlipidemia, Diabetes, Atrial Fibrillation, Heart Failure, Coronary Artery Disease, Osteoporosis, Allergic Rhinitis, and Chronic Pain  Care Plan : General Pharmacy (Adult)  Updates made by Germaine Pomfret, RPH since 03/30/2022 12:00 AM     Problem: Hypertension, Hyperlipidemia, Diabetes, Atrial Fibrillation, Heart Failure, Coronary Artery Disease, Osteoporosis, Allergic Rhinitis, and Chronic Pain   Priority: High     Long-Range Goal: Patient-Specific Goal   Start Date: 02/10/2021  Expected End Date: 03/31/2023  This Visit's Progress: On track  Recent Progress: On track  Priority: High  Note:   Current Barriers:  No barriers noted  Pharmacist Clinical Goal(s):  Patient will maintain control of diabetes as evidenced by A1c less than 8%  maintain control of heart failure as evidenced by stable weight, fluid status through collaboration with PharmD and  provider.   Interventions: 1:1 collaboration with Steele Sizer, MD regarding development and update of comprehensive plan of care as evidenced by provider attestation and co-signature Inter-disciplinary care team collaboration (see longitudinal plan of care) Comprehensive medication review performed; medication list updated in electronic medical record  Diabetes (A1c goal <8%) -Controlled -Managed by Dr. Honor Junes  -Current medications: Byduren BCISE 2 mg weekly Humalog 2-6 units three times daily (has not needed) Lantus 16 units nightly.  -Medications previously tried: NA  -Current home glucose readings: Averaging 126 Utilizing freestyle libre system -Reports hypoglycemic symptoms a few time weekly in the 70-80s. Treats with orange juice. Patient is unsure of any specific patterns that causes her blood sugar to drop low.  -She is still considering the potential of starting an SGLT-2 inhibitor  -Recommended to continue current medication  Heart Failure (Goal: manage symptoms and prevent exacerbations) -Controlled -Last ejection fraction: 45-50% (Date: 01/23/21) -HF type: Combined Systolic and Diastolic -NYHA Class: II (slight limitation of activity) -AHA HF Stage: C (Heart disease and symptoms present) -Current treatment: Carvedilol 3.125 mg twice daily  Entresto 49-51 mg twice daily  Furosemide 40 mg twice daily   -Medications previously tried: Metoprolol (Bradycardia), irbesartan, HCTZ, Telmisartan -Current home BP/HR readings: Has not been able to monitor recently  -Patient weighs daily, reports weight has been stable between 174-178  -Recommended to continue current medication  Atrial Fibrillation (Goal: prevent stroke and major bleeding) -Controlled -CHADSVASC: 6 -Current treatment: Rate control: Amiodarone 100 mg tablet daily Anticoagulation: Eliquis 5 mg twice daily  -Medications previously tried: Flecainide,  -Patient wondering if she may be back in A-Fib, denies  increased breathlessness, but does feel like at times her heart feels off.  -Continue current medications  Hyperlipidemia: (LDL goal < 70) -Controlled -Current treatment: Rosuvastatin 10 mg daily  -Medications previously tried: NA  -Recommended to continue current  medication  Osteoporosis (Goal Prevent bone fractures) -Controlled -Patient is a candidate for pharmacologic treatment due to T-Score < -2.5 in femoral neck -Current treatment  Ergocalciferol 50,000 units weekly Vitamin D3 1000 units daily Prolia 60 mg into the skin every 6 months (next shot due September) -Medications previously tried: NA  -Recommended to continue current medication  Patient Goals/Self-Care Activities Patient will:  - check glucose 3-4 times daily, document, and provide at future appointments check blood pressure weekly, document, and provide at future appointments weigh daily, and contact provider if weight gain of greater than 2 pounds in 24 hours  Follow Up Plan: Telephone follow up appointment with care management team member scheduled for:  10/02/2021 at 3:45 PM    Medication Assistance: None required.  Patient affirms current coverage meets needs.  Compliance/Adherence/Medication fill history: Care Gaps: COVID-19 Vaccine Influenza vaccine   Star-Rating Drugs: Rosuvastatin 10 mg last filled on 01/16/2021 for a 90-Day supply with [facility] Losartan 25 mg last filled on 01/31/2021 for a 30-Day supply with [facility]  Patient's [facility]  [id number] [place] [address]Phone: [phone]: [phone] [place] [id number]: [phone]: [phone]  Uses pill box? Yes Pt endorses 100% compliance  We discussed: Current pharmacy is preferred with insurance plan and patient is satisfied  with pharmacy services Patient decided to: Continue current medication management strategy  Care Plan and Follow Up Patient Decision:  Patient agrees to Care Plan and Follow-up.  Plan: Telephone follow up appointment with care management team member scheduled for:  10/02/2021 at 3:45 PM  AMalva Limes [phone]

## 2022-03-26 DIAGNOSIS — M5416 Radiculopathy, lumbar region: Secondary | ICD-10-CM | POA: Diagnosis not present

## 2022-03-26 DIAGNOSIS — M9903 Segmental and somatic dysfunction of lumbar region: Secondary | ICD-10-CM | POA: Diagnosis not present

## 2022-03-26 DIAGNOSIS — M5033 Other cervical disc degeneration, cervicothoracic region: Secondary | ICD-10-CM | POA: Diagnosis not present

## 2022-03-26 DIAGNOSIS — M9901 Segmental and somatic dysfunction of cervical region: Secondary | ICD-10-CM | POA: Diagnosis not present

## 2022-03-29 DIAGNOSIS — E1165 Type 2 diabetes mellitus with hyperglycemia: Secondary | ICD-10-CM | POA: Diagnosis not present

## 2022-03-29 DIAGNOSIS — Z794 Long term (current) use of insulin: Secondary | ICD-10-CM | POA: Diagnosis not present

## 2022-03-29 DIAGNOSIS — M810 Age-related osteoporosis without current pathological fracture: Secondary | ICD-10-CM | POA: Diagnosis not present

## 2022-03-30 NOTE — Patient Instructions (Signed)
Visit Information It was great speaking with you today!  Please let me know if you have any questions about our visit.   Goals Addressed             This Visit's Progress    Monitor and Manage My Blood Sugar-Diabetes Type 2   On track    Timeframe:  Long-Range Goal Priority:  High Start Date: 02/09/2021                             Expected End Date:  02/10/2023                     Follow Up within 90 days    - check blood sugar at prescribed times - check blood sugar before and after exercise - check blood sugar if I feel it is too high or too low - take the blood sugar meter to all doctor visits    Why is this important?   Checking your blood sugar at home helps to keep it from getting very high or very low.  Writing the results in a diary or log helps the doctor know how to care for you.  Your blood sugar log should have the time, date and the results.  Also, write down the amount of insulin or other medicine that you take.  Other information, like what you ate, exercise done and how you were feeling, will also be helpful.     Notes:         Patient Care Plan: General Pharmacy (Adult)     Problem Identified: Hypertension, Hyperlipidemia, Diabetes, Atrial Fibrillation, Heart Failure, Coronary Artery Disease, Osteoporosis, Allergic Rhinitis, and Chronic Pain   Priority: High     Long-Range Goal: Patient-Specific Goal   Start Date: 02/10/2021  Expected End Date: 03/31/2023  This Visit's Progress: On track  Recent Progress: On track  Priority: High  Note:   Current Barriers:  No barriers noted  Pharmacist Clinical Goal(s):  Patient will maintain control of diabetes as evidenced by A1c less than 8%  maintain control of heart failure as evidenced by stable weight, fluid status through collaboration with PharmD and provider.   Interventions: 1:1 collaboration with Steele Sizer, MD regarding development and update of comprehensive plan of care as evidenced by  provider attestation and co-signature Inter-disciplinary care team collaboration (see longitudinal plan of care) Comprehensive medication review performed; medication list updated in electronic medical record  Diabetes (A1c goal <8%) -Controlled -Managed by Dr. Honor Junes  -Current medications: Byduren BCISE 2 mg weekly Humalog 2-6 units three times daily (has not needed) Lantus 16 units nightly.  -Medications previously tried: NA  -Current home glucose readings: Averaging 126 Utilizing freestyle libre system -Reports hypoglycemic symptoms a few time weekly in the 70-80s. Treats with orange juice. Patient is unsure of any specific patterns that causes her blood sugar to drop low.  -She is still considering the potential of starting an SGLT-2 inhibitor  -Recommended to continue current medication  Heart Failure (Goal: manage symptoms and prevent exacerbations) -Controlled -Last ejection fraction: 45-50% (Date: 01/23/21) -HF type: Combined Systolic and Diastolic -NYHA Class: II (slight limitation of activity) -AHA HF Stage: C (Heart disease and symptoms present) -Current treatment: Carvedilol 3.125 mg twice daily  Entresto 49-51 mg twice daily  Furosemide 40 mg twice daily   -Medications previously tried: Metoprolol (Bradycardia), irbesartan, HCTZ, Telmisartan -Current home BP/HR readings: Has not been able to monitor recently  -  Patient weighs daily, reports weight has been stable between 174-178  -Recommended to continue current medication  Atrial Fibrillation (Goal: prevent stroke and major bleeding) -Controlled -CHADSVASC: 6 -Current treatment: Rate control: Amiodarone 100 mg tablet daily Anticoagulation: Eliquis 5 mg twice daily  -Medications previously tried: Flecainide,  -Patient wondering if she may be back in A-Fib, denies increased breathlessness, but does feel like at times her heart feels off.  -Continue current medications  Hyperlipidemia: (LDL goal <  70) -Controlled -Current treatment: Rosuvastatin 10 mg daily  -Medications previously tried: NA  -Recommended to continue current medication  Osteoporosis (Goal Prevent bone fractures) -Controlled -Patient is a candidate for pharmacologic treatment due to T-Score < -2.5 in femoral neck -Current treatment  Ergocalciferol 50,000 units weekly Vitamin D3 1000 units daily Prolia 60 mg into the skin every 6 months (next shot due September) -Medications previously tried: NA  -Recommended to continue current medication  Patient Goals/Self-Care Activities Patient will:  - check glucose 3-4 times daily, document, and provide at future appointments check blood pressure weekly, document, and provide at future appointments weigh daily, and contact provider if weight gain of greater than 2 pounds in 24 hours  Follow Up Plan: Telephone follow up appointment with care management team member scheduled for:  10/02/2021 at 3:45 PM    Patient agreed to services and verbal consent obtained.   Patient verbalizes understanding of instructions and care plan provided today and agrees to view in [place]. Active MyChart status and patient understanding of how to access instructions and care plan via MyChart confirmed with patient.     Malva Limes, [place] Pharmacist Practitioner  [facility] [phone]

## 2022-03-31 DIAGNOSIS — I5040 Unspecified combined systolic (congestive) and diastolic (congestive) heart failure: Secondary | ICD-10-CM

## 2022-03-31 DIAGNOSIS — M810 Age-related osteoporosis without current pathological fracture: Secondary | ICD-10-CM

## 2022-03-31 DIAGNOSIS — E1159 Type 2 diabetes mellitus with other circulatory complications: Secondary | ICD-10-CM

## 2022-03-31 DIAGNOSIS — E785 Hyperlipidemia, unspecified: Secondary | ICD-10-CM | POA: Diagnosis not present

## 2022-03-31 DIAGNOSIS — I110 Hypertensive heart disease with heart failure: Secondary | ICD-10-CM

## 2022-03-31 DIAGNOSIS — I4891 Unspecified atrial fibrillation: Secondary | ICD-10-CM | POA: Diagnosis not present

## 2022-03-31 DIAGNOSIS — Z794 Long term (current) use of insulin: Secondary | ICD-10-CM | POA: Diagnosis not present

## 2022-04-02 DIAGNOSIS — M5416 Radiculopathy, lumbar region: Secondary | ICD-10-CM | POA: Diagnosis not present

## 2022-04-02 DIAGNOSIS — M9903 Segmental and somatic dysfunction of lumbar region: Secondary | ICD-10-CM | POA: Diagnosis not present

## 2022-04-02 DIAGNOSIS — M5033 Other cervical disc degeneration, cervicothoracic region: Secondary | ICD-10-CM | POA: Diagnosis not present

## 2022-04-02 DIAGNOSIS — M9901 Segmental and somatic dysfunction of cervical region: Secondary | ICD-10-CM | POA: Diagnosis not present

## 2022-04-11 ENCOUNTER — Observation Stay
Admission: EM | Admit: 2022-04-11 | Discharge: 2022-04-12 | Disposition: A | Discharge status: Home / Self Care | Payer: Medicare Other | Attending: Internal Medicine | Admitting: Internal Medicine

## 2022-04-11 ENCOUNTER — Encounter: Payer: Self-pay | Admitting: Emergency Medicine

## 2022-04-11 ENCOUNTER — Emergency Department: Payer: Medicare Other

## 2022-04-11 DIAGNOSIS — R000 Tachycardia, unspecified: Secondary | ICD-10-CM | POA: Diagnosis present

## 2022-04-11 DIAGNOSIS — E119 Type 2 diabetes mellitus without complications: Secondary | ICD-10-CM

## 2022-04-11 DIAGNOSIS — G8929 Other chronic pain: Secondary | ICD-10-CM | POA: Diagnosis not present

## 2022-04-11 DIAGNOSIS — R7989 Other specified abnormal findings of blood chemistry: Secondary | ICD-10-CM | POA: Diagnosis not present

## 2022-04-11 DIAGNOSIS — Z79899 Other long term (current) drug therapy: Secondary | ICD-10-CM | POA: Insufficient documentation

## 2022-04-11 DIAGNOSIS — N3000 Acute cystitis without hematuria: Secondary | ICD-10-CM | POA: Diagnosis not present

## 2022-04-11 DIAGNOSIS — M7521 Bicipital tendinitis, right shoulder: Secondary | ICD-10-CM | POA: Diagnosis not present

## 2022-04-11 DIAGNOSIS — I480 Paroxysmal atrial fibrillation: Secondary | ICD-10-CM | POA: Diagnosis not present

## 2022-04-11 DIAGNOSIS — Z7901 Long term (current) use of anticoagulants: Secondary | ICD-10-CM | POA: Insufficient documentation

## 2022-04-11 DIAGNOSIS — I10 Essential (primary) hypertension: Secondary | ICD-10-CM | POA: Diagnosis present

## 2022-04-11 DIAGNOSIS — M7541 Impingement syndrome of right shoulder: Secondary | ICD-10-CM | POA: Diagnosis not present

## 2022-04-11 DIAGNOSIS — Z7984 Long term (current) use of oral hypoglycemic drugs: Secondary | ICD-10-CM | POA: Insufficient documentation

## 2022-04-11 DIAGNOSIS — M778 Other enthesopathies, not elsewhere classified: Secondary | ICD-10-CM | POA: Diagnosis not present

## 2022-04-11 DIAGNOSIS — I5042 Chronic combined systolic (congestive) and diastolic (congestive) heart failure: Secondary | ICD-10-CM

## 2022-04-11 DIAGNOSIS — Z794 Long term (current) use of insulin: Secondary | ICD-10-CM

## 2022-04-11 DIAGNOSIS — I5043 Acute on chronic combined systolic (congestive) and diastolic (congestive) heart failure: Secondary | ICD-10-CM | POA: Diagnosis not present

## 2022-04-11 DIAGNOSIS — M25511 Pain in right shoulder: Secondary | ICD-10-CM | POA: Diagnosis not present

## 2022-04-11 DIAGNOSIS — I4891 Unspecified atrial fibrillation: Secondary | ICD-10-CM

## 2022-04-11 DIAGNOSIS — E669 Obesity, unspecified: Secondary | ICD-10-CM | POA: Diagnosis present

## 2022-04-11 DIAGNOSIS — Z96651 Presence of right artificial knee joint: Secondary | ICD-10-CM | POA: Insufficient documentation

## 2022-04-11 DIAGNOSIS — I701 Atherosclerosis of renal artery: Secondary | ICD-10-CM | POA: Diagnosis present

## 2022-04-11 DIAGNOSIS — I5032 Chronic diastolic (congestive) heart failure: Secondary | ICD-10-CM

## 2022-04-11 DIAGNOSIS — Z85820 Personal history of malignant melanoma of skin: Secondary | ICD-10-CM | POA: Insufficient documentation

## 2022-04-11 DIAGNOSIS — I110 Hypertensive heart disease with heart failure: Secondary | ICD-10-CM | POA: Insufficient documentation

## 2022-04-11 DIAGNOSIS — M7551 Bursitis of right shoulder: Secondary | ICD-10-CM | POA: Diagnosis not present

## 2022-04-11 DIAGNOSIS — M19011 Primary osteoarthritis, right shoulder: Secondary | ICD-10-CM | POA: Diagnosis not present

## 2022-04-11 MED ORDER — METOPROLOL TARTRATE 5 MG/5ML IV SOLN
5.0000 mg | Freq: Once | INTRAVENOUS | Status: AC
  Administered 2022-04-12: 5 mg via INTRAVENOUS
  Filled 2022-04-11: qty 5

## 2022-04-11 NOTE — ED Provider Notes (Signed)
[facility] Provider Note    Event Date/Time   First MD Initiated Contact with Patient 04/11/22 2336     (approximate)   History   Tachycardia   HPI  Bonnie Rowland is a 81 y.o. female who presents to the ED for evaluation of Tachycardia   I review a CHF clinic visit from 6/28.  History of CHF, A-fib on Eliquis, HTN, DM and HLD.  Patient resides at a local independent living facility.  She reports developing chest discomfort and palpitations this evening after dinner and that been present for the past few hours persistently.  No syncope or falls.  Minimal dyspnea without any preceding illnesses.   Physical Exam   Triage Vital Signs: ED Triage Vitals  Enc Vitals Group     BP      Pulse      Resp      Temp      Temp src      SpO2      Weight      Height      Head Circumference      Peak Flow      Pain Score      Pain Loc      Pain Edu?      Excl. in [place]?     Most recent vital signs: Vitals:   04/12/22 0403 04/12/22 0420  BP:  (!) 152/65  Pulse:  79  Resp:  11  Temp: 98 F (36.7 C)   SpO2:  97%    General: Awake, no distress.  Looks well CV:  Good peripheral perfusion.  Tachycardic and irregular Resp:  Normal effort.  Abd:  No distention.  MSK:  No deformity noted.  Trace pitting edema bilaterally symmetrically Neuro:  No focal deficits appreciated. Other:     ED Results / Procedures / Treatments   Labs (all labs ordered are listed, but only abnormal results are displayed) Labs Reviewed  COMPREHENSIVE METABOLIC PANEL - Abnormal; Notable for the following components:      Result Value   Glucose, Bld 210 (*)    BUN 28 (*)    Creatinine, Ser 1.03 (*)    AST 45 (*)    Total Bilirubin 1.4 (*)    GFR, Estimated 55 (*)    All other components within normal limits  CBC WITH DIFFERENTIAL/PLATELET - Abnormal; Notable for the following components:   Neutro Abs 8.3 (*)    All other components within normal limits  BRAIN  NATRIURETIC PEPTIDE - Abnormal; Notable for the following components:   B Natriuretic Peptide 347.5 (*)    All other components within normal limits  URINALYSIS, ROUTINE W REFLEX MICROSCOPIC - Abnormal; Notable for the following components:   Color, Urine YELLOW (*)    APPearance TURBID (*)    Glucose, UA >=500 (*)    Ketones, ur 5 (*)    Protein, ur 30 (*)    Bacteria, UA RARE (*)    All other components within normal limits  TROPONIN I (HIGH SENSITIVITY) - Abnormal; Notable for the following components:   Troponin I (High Sensitivity) 33 (*)    All other components within normal limits  MAGNESIUM  TROPONIN I (HIGH SENSITIVITY)    EKG A-fib with RVR, rate of 148 bpm.  Rightward axis.  Nonspecific ST changes throughout with stigmata of global ischemia with elevations in RVR and diffuse depressions.  No clear STEMI.  RADIOLOGY CXR interpreted by me without evidence of acute  cardiopulmonary pathology.  Official radiology report(s): DG Chest Portable 1 View  Result Date: 04/12/2022 CLINICAL DATA:  Atrial fibrillation.  Chest pain. EXAM: PORTABLE CHEST 1 VIEW COMPARISON:  01/23/2021 FINDINGS: Heart size and pulmonary vascularity are normal. Lungs are clear. No pleural effusions. No pneumothorax. Mediastinal contours appear intact. Calcification of the aorta. IMPRESSION: No evidence of active pulmonary disease. Electronically Signed   By: Lucienne Capers M.D.   On: 04/12/2022 00:20    PROCEDURES and INTERVENTIONS:  .1-3 Lead EKG Interpretation  Performed by: Vladimir Crofts, MD Authorized by: Vladimir Crofts, MD     Interpretation: abnormal     ECG rate:  137   ECG rate assessment: tachycardic     Rhythm: atrial fibrillation     Ectopy: PAC     Conduction: normal   .Critical Care  Performed by: Vladimir Crofts, MD Authorized by: Vladimir Crofts, MD   Critical care provider statement:    Critical care time (minutes):  30   Critical care time was exclusive of:  Separately billable  procedures and treating other patients   Critical care was necessary to treat or prevent imminent or life-threatening deterioration of the following conditions:  Cardiac failure and circulatory failure   Critical care was time spent personally by me on the following activities:  Development of treatment plan with patient or surrogate, discussions with consultants, evaluation of patient's response to treatment, examination of patient, ordering and review of laboratory studies, ordering and review of radiographic studies, ordering and performing treatments and interventions, pulse oximetry, re-evaluation of patient's condition and review of old charts .Sedation  Date/Time: 04/12/2022 4:57 AM  Performed by: Vladimir Crofts, MD Authorized by: Vladimir Crofts, MD   Consent:    Consent obtained:  Verbal   Consent given by:  Patient Universal protocol:    Immediately prior to procedure, a time out was called: yes   Pre-sedation assessment:    Time since last food or drink:  6p   ASA classification: class 3 - patient with severe systemic disease     Mallampati score:  I - soft palate, uvula, fauces, pillars visible   Pre-sedation assessments completed and reviewed: airway patency, cardiovascular function and hydration status   Immediate pre-procedure details:    Reassessment: Patient reassessed immediately prior to procedure     Reviewed: vital signs, relevant labs/tests and NPO status     Verified: bag valve mask available, emergency equipment available, intubation equipment available, IV patency confirmed, oxygen available, reversal medications available and suction available   Procedure details (see MAR for exact dosages):    Preoxygenation:  Nasal cannula   Sedation:  Etomidate   Intended level of sedation: deep   Analgesia:  Fentanyl   Intra-procedure monitoring:  Blood pressure monitoring, cardiac monitor, continuous pulse oximetry, continuous capnometry, frequent LOC assessments and frequent  vital sign checks   Intra-procedure events: none     Intra-procedure management:  Airway repositioning   Total Provider sedation time (minutes):  10 Post-procedure details:    Procedure completion:  Tolerated well, no immediate complications Comments:     For sedation to facilitate first cardioversion was well-tolerated, she was snoring so we did provide brief and gentle jaw thrust. .Cardioversion  Date/Time: 04/12/2022 4:59 AM  Performed by: Vladimir Crofts, MD Authorized by: Vladimir Crofts, MD   Consent:    Consent obtained:  Verbal   Consent given by:  Patient Pre-procedure details:    Cardioversion basis:  Elective   Rhythm:  Atrial fibrillation   Electrode  placement:  Anterior-posterior Patient sedated: Yes. Refer to sedation procedure documentation for details of sedation.  Attempt one:    Cardioversion mode:  Synchronous   Waveform:  Biphasic   Shock (Joules):  200   Shock outcome:  Conversion to normal sinus rhythm Post-procedure details:    Patient status:  Awake   Patient tolerance of procedure:  Tolerated well, no immediate complications Comments:     Cardioversion at 200 J, synchronized, returned to a sinus rhythm for about 5-10 minutes and then unfortunately recurs back to atrial fibrillation after this.  .Sedation  Date/Time: 04/12/2022 4:59 AM  Performed by: Vladimir Crofts, MD Authorized by: Vladimir Crofts, MD   Consent:    Consent obtained:  Verbal   Consent given by:  Patient Universal protocol:    Immediately prior to procedure, a time out was called: yes   Pre-sedation assessment:    Time since last food or drink:  6p   ASA classification: class 3 - patient with severe systemic disease     Mallampati score:  I - soft palate, uvula, fauces, pillars visible   Pre-sedation assessments completed and reviewed: airway patency   Immediate pre-procedure details:    Reassessment: Patient reassessed immediately prior to procedure     Reviewed: vital signs, relevant  labs/tests and NPO status     Verified: bag valve mask available, emergency equipment available, intubation equipment available, IV patency confirmed, oxygen available, reversal medications available and suction available   Procedure details (see MAR for exact dosages):    Preoxygenation:  Nasal cannula   Sedation:  Etomidate   Intended level of sedation: deep   Analgesia:  Fentanyl   Intra-procedure monitoring:  Blood pressure monitoring, cardiac monitor, continuous pulse oximetry, continuous capnometry, frequent LOC assessments and frequent vital sign checks   Intra-procedure events: none     Total Provider sedation time (minutes):  10 Post-procedure details:    Procedure completion:  Tolerated well, no immediate complications Comments:     Well-tolerated .Cardioversion  Date/Time: 04/12/2022 5:00 AM  Performed by: Vladimir Crofts, MD Authorized by: Vladimir Crofts, MD   Consent:    Consent obtained:  Verbal   Consent given by:  Patient Pre-procedure details:    Cardioversion basis:  Elective   Rhythm:  Atrial fibrillation   Electrode placement:  Anterior-posterior Patient sedated: Yes. Refer to sedation procedure documentation for details of sedation.  Attempt one:    Cardioversion mode:  Synchronous   Waveform:  Biphasic   Shock (Joules):  200   Shock outcome:  No change in rhythm Post-procedure details:    Patient status:  Awake   Patient tolerance of procedure:  Tolerated well, no immediate complications   Medications  metoprolol tartrate (LOPRESSOR) injection 5 mg (5 mg Intravenous Given 04/12/22 0001)  furosemide (LASIX) injection 60 mg (60 mg Intravenous Given 04/12/22 0245)  fentaNYL (SUBLIMAZE) injection 50 mcg (50 mcg Intravenous Given 04/12/22 0318)  etomidate (AMIDATE) injection 12 mg (12 mg Intravenous Given 04/12/22 0319)  lactated ringers bolus 1,000 mL (0 mLs Intravenous Stopped 04/12/22 0353)  etomidate (AMIDATE) injection 10 mg (10 mg Intravenous Given  04/12/22 0416)     IMPRESSION / MDM / ASSESSMENT AND PLAN / ED COURSE  I reviewed the triage vital signs and the nursing notes.  Differential diagnosis includes, but is not limited to, ACS, PTX, PNA, muscle strain/spasm, PE, dissection, CHF exacerbation  {Patient presents with symptoms of an acute illness or injury that is potentially life-threatening.  81 year old woman presents to  the ED in symptomatic atrial fibrillation requiring medical admission after failed cardioversion.  She presents with symptomatic rapid A-fib, improved with IV metoprolol.  Remains in A-fib with RVR and symptomatic.  Work-up is fairly benign overall with a clear CXR, normal CBC, mag.  Essentially normal metabolic panel.  Her BNP is noted to be slightly elevated, though she has no clinical features to suggest CHF exacerbation and no gross volume overload.  She is requesting cardioversion, which I perform with sedation which does return her to a sinus rhythm for about 10 minutes, but unfortunately she degrades back into atrial fibrillation.  As below, we had a long discussion regarding plan of care.  I was hesitant to cardiovert again, which was her strong request, because she thought a second attempt would be worthwhile as in the past a second attempt has fixed this situation.  I eventually comply with her request and attempt another cardioversion at 200 J, does not even get her back to a sinus rhythm and she remains in atrial fibrillation.  Due to the continued symptomatic nature of her condition, elevated troponins we will consult medicine for admission.  Anticipate this is more rate related though.  We will continue diuresis in the meantime.  Clinical Course as of 04/12/22 0456  Wed Apr 11, 2022  2354 Reassessed and discussed plan of care  [DS]  Thu Apr 12, 2022  0142 Reassessed.  Improved rates.  Still feels okay.  Repeat EKG with what appears to be atrial flutter with variable block.  Still irregular and not  clearly sinus.  She is requesting cardioversion. [DS]  0310 Reassessed. Got her moved to a room from the hallway, hooked up, waiting on meds to cardiovert. We discuss risks and benefits, she remains agreeable [DS]  0331 Sedation and cardioverted well tolerated. X1 at 200J synchronized. Returned to a sinus rhythm on repeat EKG. a few minutes later as she was awakening, I do note the monitor has some irregularity we will print out another twelve-lead that does unfortunately seem to demonstrate return of atrial flutter. [DS]  7782 Reassessed. She is awake and frustrated that she is back in A-fib.  She requests I perform another cardioversion.  Reports that previously has required multiple attempts in the past and she is explicitly asking that I do this again.  I expressed hesitation in doing so and that there is risks of sedation and may not be of benefit, as I am concerned that she may persist in atrial fibrillation.  She acknowledges this and still requests repeat cardioversion. [DS]  4235 Another cardioversion attempt, unfortunately similar results. Well tolerated though [DS]  0425 Remains in slow afib [DS]  3614 Reassessed.  Remains in A-fib.  Discussed plan of care and she is agreeable to stay for admission due to the continued symptomatic nature [DS]    Clinical Course User Index [DS] Vladimir Crofts, MD     FINAL CLINICAL IMPRESSION(S) / ED DIAGNOSES   Final diagnoses:  Atrial fibrillation with RVR ([place])  Elevated brain natriuretic peptide (BNP) level     Rx / DC Orders   ED Discharge Orders     None        Note:  This document was prepared using Dragon voice recognition software and may include unintentional dictation errors.   Vladimir Crofts, MD 04/12/22 [id number]

## 2022-04-11 NOTE — ED Triage Notes (Signed)
Pt presents via EMS with complaints of chest pressure and tachycardia tonight after dinner. Hx of afib - complaint with medications. With EMS pts HR was in the 160s. Denies SOB.    18G LAC 135/66 168 HR 98% RA

## 2022-04-12 ENCOUNTER — Emergency Department: Payer: Medicare Other

## 2022-04-12 DIAGNOSIS — I4891 Unspecified atrial fibrillation: Secondary | ICD-10-CM | POA: Diagnosis not present

## 2022-04-12 DIAGNOSIS — I480 Paroxysmal atrial fibrillation: Secondary | ICD-10-CM | POA: Diagnosis not present

## 2022-04-12 DIAGNOSIS — I5032 Chronic diastolic (congestive) heart failure: Secondary | ICD-10-CM

## 2022-04-12 DIAGNOSIS — E669 Obesity, unspecified: Secondary | ICD-10-CM | POA: Diagnosis present

## 2022-04-12 LAB — COMPREHENSIVE METABOLIC PANEL
ALT: 21 U/L (ref 0–44)
AST: 45 U/L — ABNORMAL HIGH (ref 15–41)
Albumin: 4.3 g/dL (ref 3.5–5.0)
Alkaline Phosphatase: 64 U/L (ref 38–126)
Anion gap: 12 (ref 5–15)
BUN: 28 mg/dL — ABNORMAL HIGH (ref 8–23)
CO2: 23 mmol/L (ref 22–32)
Calcium: 9.5 mg/dL (ref 8.9–10.3)
Chloride: 104 mmol/L (ref 98–111)
Creatinine, Ser: 1.03 mg/dL — ABNORMAL HIGH (ref 0.44–1.00)
GFR, Estimated: 55 mL/min — ABNORMAL LOW (ref >60)
Glucose, Bld: 210 mg/dL — ABNORMAL HIGH (ref 70–99)
Potassium: 4.4 mmol/L (ref 3.5–5.1)
Sodium: 139 mmol/L (ref 135–145)
Total Bilirubin: 1.4 mg/dL — ABNORMAL HIGH (ref 0.3–1.2)
Total Protein: 7.4 g/dL (ref 6.5–8.1)

## 2022-04-12 LAB — TROPONIN I (HIGH SENSITIVITY)
Troponin I (High Sensitivity): 11 ng/L (ref <18)
Troponin I (High Sensitivity): 33 ng/L — ABNORMAL HIGH (ref <18)

## 2022-04-12 LAB — URINALYSIS, ROUTINE W REFLEX MICROSCOPIC
Bilirubin Urine: NEGATIVE
Glucose, UA: >=500 mg/dL — AB
Hgb urine dipstick: NEGATIVE
Ketones, ur: 5 mg/dL — AB
Leukocytes,Ua: NEGATIVE
Nitrite: NEGATIVE
Protein, ur: 30 mg/dL — AB
Specific Gravity, Urine: 1.029 (ref 1.005–1.030)
pH: 5 (ref 5.0–8.0)

## 2022-04-12 LAB — CBC WITH DIFFERENTIAL/PLATELET
Abs Immature Granulocytes: 0.03 10*3/uL (ref 0.00–0.07)
Basophils Absolute: 0 10*3/uL (ref 0.0–0.1)
Basophils Relative: 0 %
Eosinophils Absolute: 0 10*3/uL (ref 0.0–0.5)
Eosinophils Relative: 0 %
HCT: 42 % (ref 36.0–46.0)
Hemoglobin: 13.8 g/dL (ref 12.0–15.0)
Immature Granulocytes: 0 %
Lymphocytes Relative: 10 %
Lymphs Abs: 0.9 10*3/uL (ref 0.7–4.0)
MCH: 31.9 pg (ref 26.0–34.0)
MCHC: 32.9 g/dL (ref 30.0–36.0)
MCV: 97.2 fL (ref 80.0–100.0)
Monocytes Absolute: 0.1 10*3/uL (ref 0.1–1.0)
Monocytes Relative: 1 %
Neutro Abs: 8.3 10*3/uL — ABNORMAL HIGH (ref 1.7–7.7)
Neutrophils Relative %: 89 %
Platelets: 199 10*3/uL (ref 150–400)
RBC: 4.32 MIL/uL (ref 3.87–5.11)
RDW: 12.6 % (ref 11.5–15.5)
WBC: 9.3 10*3/uL (ref 4.0–10.5)
nRBC: 0 % (ref 0.0–0.2)

## 2022-04-12 LAB — TSH: TSH: 1.422 u[IU]/mL (ref 0.350–4.500)

## 2022-04-12 LAB — CBG MONITORING, ED
Glucose-Capillary: 206 mg/dL — ABNORMAL HIGH (ref 70–99)
Glucose-Capillary: 169 mg/dL — ABNORMAL HIGH (ref 70–99)

## 2022-04-12 LAB — HEMOGLOBIN A1C
Hgb A1c MFr Bld: 6.4 % — ABNORMAL HIGH (ref 4.8–5.6)
Mean Plasma Glucose: 136.98 mg/dL

## 2022-04-12 LAB — BRAIN NATRIURETIC PEPTIDE: B Natriuretic Peptide: 347.5 pg/mL — ABNORMAL HIGH (ref 0.0–100.0)

## 2022-04-12 LAB — MAGNESIUM: Magnesium: 2.4 mg/dL (ref 1.7–2.4)

## 2022-04-12 MED ORDER — FUROSEMIDE 40 MG PO TABS
80.0000 mg | ORAL_TABLET | Freq: Every day | ORAL | Status: DC
  Administered 2022-04-12: 80 mg via ORAL
  Filled 2022-04-12: qty 2

## 2022-04-12 MED ORDER — FUROSEMIDE 10 MG/ML IJ SOLN
60.0000 mg | Freq: Once | INTRAMUSCULAR | Status: AC
  Administered 2022-04-12: 60 mg via INTRAVENOUS
  Filled 2022-04-12: qty 8

## 2022-04-12 MED ORDER — VITAMIN D 25 MCG (1000 UNIT) PO TABS
1000.0000 [IU] | ORAL_TABLET | Freq: Every day | ORAL | Status: DC
  Administered 2022-04-12: 1000 [IU] via ORAL
  Filled 2022-04-12: qty 1

## 2022-04-12 MED ORDER — SACUBITRIL-VALSARTAN 49-51 MG PO TABS
1.0000 | ORAL_TABLET | Freq: Two times a day (BID) | ORAL | Status: DC
  Administered 2022-04-12: 1 via ORAL
  Filled 2022-04-12: qty 1

## 2022-04-12 MED ORDER — TIZANIDINE HCL 2 MG PO TABS: 2.0000 mg | ORAL_TABLET | Freq: Every day | ORAL | Status: DC

## 2022-04-12 MED ORDER — LACTATED RINGERS IV BOLUS
1000.0000 mL | Freq: Once | INTRAVENOUS | Status: AC
  Administered 2022-04-12: 1000 mL via INTRAVENOUS

## 2022-04-12 MED ORDER — CARVEDILOL 6.25 MG PO TABS: 3.1250 mg | ORAL_TABLET | Freq: Two times a day (BID) | ORAL | Status: DC

## 2022-04-12 MED ORDER — CARVEDILOL 6.25 MG PO TABS
6.2500 mg | ORAL_TABLET | Freq: Two times a day (BID) | ORAL | Status: DC
  Administered 2022-04-12: 6.25 mg via ORAL
  Filled 2022-04-12: qty 1

## 2022-04-12 MED ORDER — ACETAMINOPHEN 325 MG RE SUPP: 650.0000 mg | Freq: Four times a day (QID) | RECTAL | Status: DC | PRN

## 2022-04-12 MED ORDER — ROSUVASTATIN CALCIUM 10 MG PO TABS
10.0000 mg | ORAL_TABLET | Freq: Every day | ORAL | Status: DC
  Administered 2022-04-12: 10 mg via ORAL
  Filled 2022-04-12: qty 1

## 2022-04-12 MED ORDER — POTASSIUM & MAGNESIUM ASPARTAT 250-250 MG PO CAPS: ORAL_CAPSULE | Freq: Every day | ORAL | Status: DC

## 2022-04-12 MED ORDER — AMIODARONE HCL 200 MG PO TABS: 100.0000 mg | ORAL_TABLET | Freq: Every day | ORAL | Status: DC

## 2022-04-12 MED ORDER — POLYVINYL ALCOHOL 1.4 % OP SOLN: Freq: Every day | OPHTHALMIC | Status: DC | PRN

## 2022-04-12 MED ORDER — ACETAMINOPHEN 325 MG PO TABS
650.0000 mg | ORAL_TABLET | Freq: Four times a day (QID) | ORAL | Status: DC | PRN
  Administered 2022-04-12: 650 mg via ORAL
  Filled 2022-04-12: qty 2

## 2022-04-12 MED ORDER — APIXABAN 5 MG PO TABS
5.0000 mg | ORAL_TABLET | Freq: Two times a day (BID) | ORAL | Status: DC
  Administered 2022-04-12: 5 mg via ORAL
  Filled 2022-04-12: qty 1

## 2022-04-12 MED ORDER — CARVEDILOL 6.25 MG PO TABS: 6.2500 mg | ORAL_TABLET | Freq: Two times a day (BID) | ORAL | 2 refills | Status: DC

## 2022-04-12 MED ORDER — ESTRADIOL 0.1 MG/GM VA CREA
0.5000 | TOPICAL_CREAM | VAGINAL | Status: DC
  Filled 2022-04-12: qty 42.5

## 2022-04-12 MED ORDER — ETOMIDATE 2 MG/ML IV SOLN
12.0000 mg | Freq: Once | INTRAVENOUS | Status: AC
  Administered 2022-04-12: 12 mg via INTRAVENOUS
  Filled 2022-04-12: qty 10

## 2022-04-12 MED ORDER — OCUVITE-LUTEIN PO CAPS: ORAL_CAPSULE | Freq: Two times a day (BID) | ORAL | Status: DC

## 2022-04-12 MED ORDER — ETOMIDATE 2 MG/ML IV SOLN
10.0000 mg | Freq: Once | INTRAVENOUS | Status: AC
  Administered 2022-04-12: 10 mg via INTRAVENOUS
  Filled 2022-04-12: qty 10

## 2022-04-12 MED ORDER — ONDANSETRON HCL 4 MG PO TABS: 4.0000 mg | ORAL_TABLET | Freq: Four times a day (QID) | ORAL | Status: DC | PRN

## 2022-04-12 MED ORDER — CALCIUM CARBONATE ANTACID 500 MG PO CHEW
500.0000 mg | CHEWABLE_TABLET | Freq: Every day | ORAL | Status: DC
  Administered 2022-04-12: 500 mg via ORAL
  Filled 2022-04-12: qty 3

## 2022-04-12 MED ORDER — INSULIN GLARGINE-YFGN 100 UNIT/ML ~~LOC~~ SOLN
14.0000 [IU] | Freq: Every day | SUBCUTANEOUS | Status: DC
  Filled 2022-04-12: qty 0.14

## 2022-04-12 MED ORDER — FENTANYL CITRATE PF 50 MCG/ML IJ SOSY
50.0000 ug | PREFILLED_SYRINGE | Freq: Once | INTRAMUSCULAR | Status: AC
  Administered 2022-04-12: 50 ug via INTRAVENOUS
  Filled 2022-04-12: qty 1

## 2022-04-12 MED ORDER — INSULIN ASPART 100 UNIT/ML IJ SOLN
0.0000 [IU] | Freq: Three times a day (TID) | INTRAMUSCULAR | Status: DC
  Administered 2022-04-12: 3 [IU] via SUBCUTANEOUS
  Filled 2022-04-12: qty 1

## 2022-04-12 MED ORDER — ACETAMINOPHEN 500 MG PO TABS: 1000.0000 mg | ORAL_TABLET | Freq: Every day | ORAL | Status: DC

## 2022-04-12 MED ORDER — RISAQUAD PO CAPS: 1.0000 | ORAL_CAPSULE | Freq: Every day | ORAL | Status: DC

## 2022-04-12 MED ORDER — ONDANSETRON HCL 4 MG/2ML IJ SOLN: 4.0000 mg | Freq: Four times a day (QID) | INTRAMUSCULAR | Status: DC | PRN

## 2022-04-12 NOTE — ED Notes (Cosign Needed Addendum)
Time Out performed - right patient - right procedure.  Cardioversion procedure performed by EDP (Dr. Tamala Julian).  200J '@0321'$  EKG obtained and reviewed by EDP Tamala Julian

## 2022-04-12 NOTE — ED Notes (Signed)
Pt to XR on the monitor with this RN.

## 2022-04-12 NOTE — Assessment & Plan Note (Signed)
Continue long-acting insulin Sliding scale coverage with NovoLog Maintain consistent carbohydrate diet

## 2022-04-12 NOTE — Progress Notes (Signed)
PHARMACIST - PHYSICIAN ORDER COMMUNICATION  CONCERNING: P&T Medication Policy on Herbal Medications  DESCRIPTION:  This patient's order for:  Potassium & Magnesium Aspartat 250-250 MG CAPS   has been noted.  This product(s) is classified as an "herbal" or natural product. Due to a lack of definitive safety studies or FDA approval, nonstandard manufacturing practices, plus the potential risk of unknown drug-drug interactions while on inpatient medications, the Pharmacy and Therapeutics Committee does not permit the use of "herbal" or natural products of this type within [facility].   ACTION TAKEN: The pharmacy department is unable to verify this order at this time.  Please reevaluate patient's clinical condition at discharge and address if the herbal or natural product(s) should be resumed at that time.  Pernell Dupre, PharmD, BCPS Clinical Pharmacist 04/12/2022 11:04 AM

## 2022-04-12 NOTE — Assessment & Plan Note (Signed)
Stable

## 2022-04-12 NOTE — H&P (Signed)
History and Physical    Patient: Bonnie Rowland EQA:[id number] DOB: 1940/12/19 DOA: 04/11/2022 DOS: the patient was seen and examined on 04/12/2022 PCP: Steele Sizer, MD  Patient coming from: ALF/ILF  Chief Complaint:  Chief Complaint  Patient presents with   Tachycardia   HPI: Bonnie Rowland is a 81 y.o. female with medical history significant for persistent atrial fibrillation on chronic anticoagulation therapy status post multiple failed cardioversion procedures, chronic combined systolic and diastolic dysfunction CHF, GERD, hypertension who presented to the ER from the local independent living facility where she resides for evaluation of palpitations and chest discomfort that started 1 day prior to her admission after dinner.  Patient states that it was persistent for hours prompting her to call EMS and she was transported to the ER for further evaluation. In the ER she was noted to be in A-fib with a rapid ventricular rate and was cardioverted twice by the ER physician at 200 J.  She converted to sinus transiently but was back in A-fib. She denies having any shortness of breath, no dizziness, no lightheadedness, no syncope, no nausea, no vomiting, no headache, no fever, no chills, no cough, no abdominal pain, no urinary symptoms, no blurred vision, no focal deficit. She will be referred to observation status for further evaluation   Review of Systems: As mentioned in the history of present illness. All other systems reviewed and are negative. 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; left shoulder blade   Cancer ([place]) 05/2014   basal cell removed left temple   Cataract 2003   Cellulitis and abscess of leg, except foot    Conjunctivitis unspecified    Dermatophytosis of nail    Diabetes mellitus    type II   Elevated troponin    Esophageal reflux    Hyperlipidemia    Hypertension    Osteoporosis 2016   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)    Renal artery stenosis (HCC)    Sprain of lumbar region    Thoracic or lumbosacral neuritis or radiculitis, unspecified    Urinary tract infection, site not specified    Past Surgical History:  Procedure Laterality Date   APPENDECTOMY     CARDIAC CATHETERIZATION     CARDIOVERSION N/A 11/30/2020   Procedure: CARDIOVERSION;  Surgeon: Minna Merritts, MD;  Location: [facility];  Service: Cardiovascular;  Laterality: N/A;   CARDIOVERSION N/A 01/20/2021   Procedure: CARDIOVERSION;  Surgeon: Minna Merritts, MD;  Location: [facility];  Service: Cardiovascular;  Laterality: N/A;   CARDIOVERSION N/A 01/30/2021   Procedure: CARDIOVERSION;  Surgeon: Wellington Hampshire, MD;  Location: [facility];  Service: Cardiovascular;  Laterality: N/A;   cataract surgery     COLONOSCOPY     COLONOSCOPY WITH PROPOFOL N/A 05/09/2016   Procedure: COLONOSCOPY WITH PROPOFOL;  Surgeon: Robert Bellow, MD;  Location: [facility];  Service: Endoscopy;  Laterality: N/A;   DIAGNOSTIC MAMMOGRAM     EYE SURGERY Bilateral    Cataract Extraction   JOINT REPLACEMENT  2016   KNEE ARTHROPLASTY Right 03/30/2015   Procedure: COMPUTER ASSISTED TOTAL KNEE ARTHROPLASTY;  Surgeon: Dereck Leep, MD;  Location: [facility];  Service: Orthopedics;  Laterality: Right;   KNEE ARTHROSCOPY Right    KNEE CLOSED REDUCTION Right 05/23/2015   Procedure: CLOSED MANIPULATION KNEE;  Surgeon: Dereck Leep, MD;  Location: [facility];  Service: Orthopedics;  Laterality: Right;   RIGHT/LEFT HEART CATH AND CORONARY ANGIOGRAPHY N/A 01/25/2021    Procedure: RIGHT/LEFT HEART CATH AND CORONARY ANGIOGRAPHY;  Surgeon: Nelva Bush, MD;  Location: [facility];  Service: Cardiovascular;  Laterality: N/A;   TONSILLECTOMY     Social History:  reports that she has never smoked. She has never used smokeless tobacco. She reports current alcohol use of about 1.0 standard drink of alcohol per week. She reports that she does not use drugs.  Allergies  Allergen Reactions   Cyclobenzaprine Hypertension   Cheese Other (See Comments)    bloating   Ciprofloxacin Hcl Other (See Comments)    Muscle pain   Coconut (Cocos Nucifera)     Upset stomach   Keflex [Cephalexin] Other (See Comments)    Patient prefers not to take this medication due to the side effects, caused tendon issues    Family History  Problem Relation Age of Onset   Diabetes Mother    Hypertension Mother    Cancer Mother    Kidney disease Mother    Hypertension Father    Cancer Father        Prostate   Breast cancer Maternal Aunt 70   Colon cancer Son    Kidney cancer Neg Hx    Bladder Cancer Neg Hx     Prior to Admission medications   Medication Sig Start Date End Date Taking? Authorizing Provider  acetaminophen (TYLENOL) 650 MG CR tablet Take 1,300 mg by mouth at bedtime.   Yes [provider]  ALFALFA PO Take 1 tablet by mouth daily.   Yes [provider]  amiodarone (PACERONE) 200 MG tablet Take 0.5 tablets (100 mg total) by mouth daily. 04/12/21  Yes Dunn, Areta Haber, [name]  ascorbic Acid (VITAMIN C) 500 MG CPCR Take 500 mg by mouth daily.   Yes [provider]  B Complex-C (B-COMPLEX WITH VITAMIN C) tablet Take 1 tablet by mouth 2 (two) times daily.   Yes [provider]  Calcium Carbonate (CALCIUM 600 PO) Take 600 mg by mouth daily.   Yes [provider]  carvedilol (COREG) 3.125 MG tablet TAKE 1 TABLET BY MOUTH 2 TIMES DAILY. 01/29/22  Yes Minna Merritts, MD  cholecalciferol (VITAMIN D) 1000 UNITS tablet  Take 1,000 Units by mouth daily. [place]   Yes [provider]  clobetasol ointment (TEMOVATE) 8.84 % APPLY 1 APPLICATION TOPICALLY 2 TIMES DAILY AS NEEDED (BUG BITES). 02/12/22  Yes Sowles, Drue Stager, MD  Coenzyme Q10 (COQ10) 200 MG CAPS Take 200 mg by mouth daily.   Yes [provider]  ELIQUIS 5 MG TABS tablet TAKE 1 TABLET BY MOUTH TWICE A DAY 03/13/22  Yes Gollan, Kathlene November, MD  estradiol (ESTRACE VAGINAL) 0.1 MG/GM vaginal cream 1/2 gm once weekly using applicator, apply blueberry sized amount of cream using tip of finger to urethra twice weekly 07/06/21  Yes McGowan, Shannon A, [name]  FARXIGA 10 MG TABS tablet Take 10 mg by mouth daily. 03/30/22  Yes [provider]  fluconazole (DIFLUCAN) 150 MG tablet Take 1 tablet (150 mg total) by mouth every other  day. 02/12/22  Yes Sowles, Drue Stager, MD  furosemide (LASIX) 40 MG tablet TAKE 1 TABLET BY MOUTH TWICE A DAY Patient taking differently: Take 80 mg by mouth daily. 08/06/21  Yes Hackney, Tina A, FNP  Glucosamine 500 MG TABS Take 500 mg by mouth daily.   Yes [provider]  HUMALOG KWIKPEN 100 UNIT/ML KiwkPen Inject 2-6 Units into the skin 3 (three) times daily before meals. If needed sliding scale 10/15/17  Yes [provider]  insulin glargine (LANTUS) 100 unit/mL SOPN Inject 14 Units into the skin at bedtime.   Yes [provider]  Javier Docker Oil 1000 MG CAPS Take 1,000 mg by mouth daily.   Yes [provider]  Mag Aspart-Potassium Aspart (POTASSIUM & MAGNESIUM ASPARTAT PO) Take 1 tablet by mouth daily.   Yes [provider]  miconazole (MICONAZOLE 3) 200 MG vaginal suppository Place 1 suppository (200 mg total) vaginally at bedtime. 01/19/22  Yes Scot Jun, FNP  Multiple Vitamins-Minerals (PRESERVISION AREDS 2) CAPS Take 1 capsule by mouth 2 (two) times daily.   Yes [provider]  mupirocin ointment (BACTROBAN) 2 % Apply 1 Application topically 2 (two) times daily.  01/23/22  Yes Sharion Balloon, NP  Probiotic Product (PROBIOTIC PO) Take 1 capsule by mouth at bedtime.   Yes [provider]  Propylene Glycol (SYSTANE BALANCE OP) Place 1 drop into both eyes daily as needed (dry eyes).   Yes [provider]  rosuvastatin (CRESTOR) 10 MG tablet TAKE 1 TABLET BY MOUTH EVERY DAY 03/18/22  Yes Sowles, Drue Stager, MD  sacubitril-valsartan (ENTRESTO) 49-51 MG Take 1 tablet by mouth 2 (two) times daily. 10/11/21  Yes Hackney, Otila Kluver A, FNP  Sodium Chloride-Sodium Bicarb (NETI POT SINUS WASH NA) Place 1 Dose into the nose at bedtime.   Yes [provider]  BD PEN NEEDLE NANO U/F 32G X 4 MM MISC USE 4 TIMES DAILY (HUMALOG 3 TIMES DAILY AND LANTUS ONCE DAILY) OFFICE NOTIFIED 08/06/17 12/07/18   Steele Sizer, MD  BLACK ELDERBERRY,BERRY-FLOWER, PO Take 15 mLs by mouth daily. Liquid    [provider]  Continuous Blood Gluc Sensor (White) MISC by Does not apply route.    [provider]  Elastic Bandages & Supports (MEDICAL COMPRESSION STOCKINGS) [place] 1 each by Does not apply route daily. 01/08/19   Steele Sizer, MD  Exenatide ER (BYDUREON BCISE) 2 MG/0.85ML AUIJ Inject 2 mg into the skin every Sunday. 02/26/20   [provider]  OVER THE COUNTER MEDICATION Place 1 application into both eyes See admin instructions. Blephadex eyelid foam, apply to eyelids once daily    [provider]  OVER THE COUNTER MEDICATION Take 1 tablet by mouth daily. Vita-Lea Gold otc supplement With out vitamin K (Shaklee products)    [provider]  OVER THE COUNTER MEDICATION Take 1 tablet by mouth See admin instructions. Nutriferon otc supplement take Take 1 tablet on Sun, Tues, Thurs, Sat. Take 1 tablet twice daily on Mon, Wed, and Fri    [provider]  PROLIA 60 MG/ML SOSY injection Inject 60 mg into the skin every 6 (six) months. 09/02/19   [provider]  tiZANidine (ZANAFLEX) 2 MG tablet  TAKE 1-2 TABLETS (2-4 MG TOTAL) BY MOUTH AT BEDTIME. 02/20/22   Steele Sizer, MD  Vitamin D, Ergocalciferol, (DRISDOL) 1.25 MG (50000 UT) CAPS capsule Take 50,000 Units by mouth every Saturday. 11/23/18   [provider]  doxycycline (VIBRA-TABS) 100 MG tablet  Take 1 tablet (100 mg total) by mouth 2 (two) times daily. 01/16/22   [name], DPM    Physical Exam: Vitals:   04/12/22 0830 04/12/22 0900 04/12/22 1030 04/12/22 1052  BP: (!) 128/56 127/61 136/77   Pulse: 83 95 79   Resp: '16 18 15   '$ Temp:    98.6 F (37 C)  TempSrc:    Oral  SpO2: 98% 97% 100%   Weight:      Height:       Physical Exam Vitals and nursing note reviewed.  Constitutional:      Appearance: Normal appearance.  HENT:     Head: Normocephalic.     Nose: Nose normal.     Mouth/Throat:     Mouth: Mucous membranes are moist.  Eyes:     Conjunctiva/sclera: Conjunctivae normal.  Cardiovascular:     Rate and Rhythm: Rhythm irregular.  Pulmonary:     Effort: Pulmonary effort is normal.     Breath sounds: Normal breath sounds.  Abdominal:     General: Abdomen is flat. Bowel sounds are normal.     Palpations: Abdomen is soft.  Musculoskeletal:        General: Normal range of motion.     Cervical back: Normal range of motion.  Skin:    General: Skin is warm and dry.  Neurological:     General: No focal deficit present.     Mental Status: She is alert.  Psychiatric:        Mood and Affect: Mood normal.        Behavior: Behavior normal.     Data Reviewed: Relevant notes from primary care and specialist visits, past discharge summaries as available in EHR, including Care Everywhere. Prior diagnostic testing as pertinent to current admission diagnoses Updated medications and problem lists for reconciliation ED course, including vitals, labs, imaging, treatment and response to treatment Triage notes, nursing and pharmacy notes and ED provider's notes Notable results as noted in HPI Labs  reviewed.  Troponin 11 >> 33, BNP 347, magnesium 2.4, sodium 139, potassium 4.4, chloride 104, bicarb 23, glucose 210, BUN 28, creatinine 1.03, calcium 9.5, total protein 7.4, albumin 4.3, AST 45, ALT 21, alkaline phosphatase 64, total bilirubin 1.4, white count 9.3, hemoglobin 13.8, hematocrit 42, platelet count 199 Chest x-ray reviewed by me shows No evidence of active pulmonary disease Twelve-lead EKG shows atrial flutter with variable AV block There are no new results to review at this time.  Assessment and Plan: * Rapid atrial fibrillation [facility]) Patient presented to the ER for evaluation of palpitations and chest pressure and was found to be in rapid A-fib. She received metoprolol 5 mg IV without any significant improvement in her heart rate and had to be cardioverted twice at 200 J. She converted to sinus rhythm transiently but is back in A-fib atrial/flutter Continue carvedilol for rate control Continue apixaban as primary prophylaxis for an acute stroke We will request cardiology consult  Obesity (BMI 30-39.9) BMI 32 Complicates overall prognosis and care Lifestyle modification and exercise has been discussed with patient in detail  Chronic diastolic CHF (congestive heart failure) ([place]) Stable and not acutely exacerbated Last known LVEF of 60 to 65% from a 2D echocardiogram which was done in 09/23 Patient checks her weight daily and denies any weight gain Continue furosemide, carvedilol and Entresto  Controlled type 2 diabetes mellitus without complication, without long-term current use of insulin (HCC) Continue long-acting insulin Sliding scale coverage with NovoLog Maintain consistent  carbohydrate diet  Atherosclerosis of renal artery (HCC) Stable  Hypertension, benign Continue carvedilol for blood pressure control      Advance Care Planning:   Code Status: Full Code   Consults: Cardiology  Family Communication: Greater than 50% of time was spent discussing  patient's condition and plan of care with patient at the bedside.  All questions and concerns have been addressed.  She verbalizes understanding and agrees with the plan.  CODE STATUS was discussed and she wishes to be full code.  Severity of Illness: The appropriate patient status for this patient is OBSERVATION. Observation status is judged to be reasonable and necessary in order to provide the required intensity of service to ensure the patient's safety. The patient's presenting symptoms, physical exam findings, and initial radiographic and laboratory data in the context of their medical condition is felt to place them at decreased risk for further clinical deterioration. Furthermore, it is anticipated that the patient will be medically stable for discharge from the hospital within 2 midnights of admission.   Author: Collier Bullock, MD 04/12/2022 11:28 AM  For on call review [email or web address].

## 2022-04-12 NOTE — Discharge Summary (Signed)
Physician Discharge Summary   Patient: Bonnie Rowland MRN: [id number] DOB: 03/11/1941  Admit date:     04/11/2022  Discharge date: 04/12/22  Discharge Physician: Keyona Emrich   PCP: Steele Sizer, MD   Recommendations at discharge:   Take medications as recommended Keep scheduled follow-up appointment with cardiology Check blood sugars AC meals Return to emergency room for worsening symptoms  Discharge Diagnoses: Principal Problem:   Rapid atrial fibrillation ([place]) Active Problems:   Hypertension, benign   Atherosclerosis of renal artery (HCC)   Controlled type 2 diabetes mellitus without complication, without long-term current use of insulin (HCC)   Chronic diastolic CHF (congestive heart failure) (HCC)   Obesity (BMI 30-39.9)  Resolved Problems:   * No resolved hospital problems. *  Hospital Course: Bonnie Rowland is a 81 y.o. female with medical history significant for persistent atrial fibrillation on chronic anticoagulation therapy status post multiple failed cardioversion procedures, chronic combined systolic and diastolic dysfunction CHF, GERD, hypertension who presented to the ER from the local independent living facility where she resides for evaluation of palpitations and chest discomfort that started 1 day prior to her admission after dinner.  Patient stated that it was persistent for hours prompting her to call EMS and she was transported to the ER for further evaluation. In the ER she was noted to be in A-fib with a rapid ventricular rate and was cardioverted twice by the ER physician at 200 J.  She converted to sinus transiently but was back in A-fib. She denied having any shortness of breath, no dizziness, no lightheadedness, no syncope, no nausea, no vomiting, no headache, no fever, no chills, no cough, no abdominal pain, no urinary symptoms, no blurred vision, no focal deficit. She was referred to observation status and has remained in A-fib/a flutter  but is rate controlled.  She was seen in consultation by cardiology who has increased the dose of carvedilol to 6.25 mg twice daily.  Patient will be discharged home to follow-up with cardiology as an outpatient and will be referred to electrophysiologist for further evaluation. She was seen and examined at bedside and is in stable condition for discharge. Assessment and Plan: * Rapid atrial fibrillation [facility]) Patient presented to the ER for evaluation of palpitations and chest pressure and was found to be in rapid A-fib. She received metoprolol 5 mg IV without any significant improvement in her heart rate and had to be cardioverted twice at 200 J. She converted to sinus rhythm transiently but is back in A-fib atrial/flutter Continue carvedilol for rate control but dose has been increased from 3.125 twice daily to 6.25 Continue apixaban as primary prophylaxis for an acute stroke Appreciate cardiology input Follow up with cardiology for referral to electrophysiologist as an outpatient  Obesity (BMI 30-39.9) BMI 32 Complicates overall prognosis and care Lifestyle modification and exercise has been discussed with patient in detail  Chronic diastolic CHF (congestive heart failure) ([place]) Stable and not acutely exacerbated Last known LVEF of 60 to 65% from a 2D echocardiogram which was done in 09/23 Patient checks her weight daily and denies any weight gain Continue furosemide, carvedilol and Entresto  Controlled type 2 diabetes mellitus without complication, without long-term current use of insulin (HCC) Continue long-acting insulin Sliding scale coverage with NovoLog Maintain consistent carbohydrate diet  Atherosclerosis of renal artery (HCC) Stable  Hypertension, benign Continue carvedilol for blood pressure control         Consultants: Cardiology Procedures performed: DCCV Disposition: Home Diet recommendation:  Discharge  Diet Orders (From admission, onward)     Start      Ordered   04/12/22 0000  Diet - low sodium heart healthy        04/12/22 1558   04/12/22 0000  Diet Carb Modified        04/12/22 1558           Carb modified diet DISCHARGE MEDICATION: Allergies as of 04/12/2022       Reactions   Cyclobenzaprine Hypertension   Cheese Other (See Comments)   bloating   Ciprofloxacin Hcl Other (See Comments)   Muscle pain   Coconut (cocos Nucifera)    Upset stomach   Keflex [cephalexin] Other (See Comments)   Patient prefers not to take this medication due to the side effects, caused tendon issues        Medication List     STOP taking these medications    fluconazole 150 MG tablet Commonly known as: DIFLUCAN   Miconazole 3 200 MG vaginal suppository Generic drug: miconazole       TAKE these medications    acetaminophen 650 MG CR tablet Commonly known as: TYLENOL Take 1,300 mg by mouth at bedtime.   ALFALFA PO Take 1 tablet by mouth daily.   amiodarone 200 MG tablet Commonly known as: PACERONE Take 0.5 tablets (100 mg total) by mouth daily.   ascorbic Acid 500 MG Cpcr Commonly known as: VITAMIN C Take 500 mg by mouth daily.   B-complex with vitamin C tablet Take 1 tablet by mouth 2 (two) times daily.   BD Pen Needle Nano U/F 32G X 4 MM Misc Generic drug: Insulin Pen Needle USE 4 TIMES DAILY (HUMALOG 3 TIMES DAILY AND LANTUS ONCE DAILY) OFFICE NOTIFIED 08/06/17   BLACK ELDERBERRY(BERRY-FLOWER) PO Take 15 mLs by mouth daily. Liquid   Bydureon BCise 2 MG/0.85ML Auij Generic drug: Exenatide ER Inject 2 mg into the skin every Sunday.   CALCIUM 600 PO Take 600 mg by mouth daily.   carvedilol 6.25 MG tablet Commonly known as: COREG Take 1 tablet (6.25 mg total) by mouth 2 (two) times daily with a meal. What changed:  medication strength how much to take when to take this   cholecalciferol 1000 units tablet Commonly known as: VITAMIN D Take 1,000 Units by mouth daily. Shaklee   clobetasol ointment 0.05  % Commonly known as: TEMOVATE APPLY 1 APPLICATION TOPICALLY 2 TIMES DAILY AS NEEDED (BUG BITES).   CoQ10 200 MG Caps Take 200 mg by mouth daily.   Eliquis 5 MG Tabs tablet Generic drug: apixaban TAKE 1 TABLET BY MOUTH TWICE A DAY   Entresto 49-51 MG Generic drug: sacubitril-valsartan Take 1 tablet by mouth 2 (two) times daily.   estradiol 0.1 MG/GM vaginal cream Commonly known as: ESTRACE VAGINAL 1/2 gm once weekly using applicator, apply blueberry sized amount of cream using tip of finger to urethra twice weekly   Farxiga 10 MG Tabs tablet Generic drug: dapagliflozin propanediol Take 10 mg by mouth daily.   FreeStyle [employer] Misc by Does not apply route.   furosemide 40 MG tablet Commonly known as: LASIX TAKE 1 TABLET BY MOUTH TWICE A DAY What changed:  how much to take when to take this   Glucosamine 500 MG Tabs Take 500 mg by mouth daily.   HumaLOG KwikPen 100 UNIT/ML KwikPen Generic drug: insulin lispro Inject 2-6 Units into the skin 3 (three) times daily before meals. If needed sliding scale   insulin glargine 100  unit/mL Sopn Commonly known as: LANTUS Inject 14 Units into the skin at bedtime.   Krill Oil 1000 MG Caps Take 1,000 mg by mouth daily.   Medical Compression Stockings Misc 1 each by Does not apply route daily.   mupirocin ointment 2 % Commonly known as: BACTROBAN Apply 1 Application topically 2 (two) times daily.   NETI POT SINUS [place] NA Place 1 Dose into the nose at bedtime.   OVER THE COUNTER MEDICATION Place 1 application into both eyes See admin instructions. Blephadex eyelid foam, apply to eyelids once daily   OVER THE COUNTER MEDICATION Take 1 tablet by mouth daily. Vita-Lea Gold otc supplement With out vitamin K (Shaklee products)   OVER THE COUNTER MEDICATION Take 1 tablet by mouth See admin instructions. Nutriferon otc supplement take Take 1 tablet on Sun, Tues, Thurs, Sat. Take 1 tablet twice daily on Mon, Wed, and  Fri   POTASSIUM & MAGNESIUM ASPARTAT PO Take 1 tablet by mouth daily.   PreserVision AREDS 2 Caps Take 1 capsule by mouth 2 (two) times daily.   PROBIOTIC PO Take 1 capsule by mouth at bedtime.   Prolia 60 MG/ML Sosy injection Generic drug: denosumab Inject 60 mg into the skin every 6 (six) months.   rosuvastatin 10 MG tablet Commonly known as: CRESTOR TAKE 1 TABLET BY MOUTH EVERY DAY   SYSTANE BALANCE OP Place 1 drop into both eyes daily as needed (dry eyes).   tiZANidine 2 MG tablet Commonly known as: ZANAFLEX TAKE 1-2 TABLETS (2-4 MG TOTAL) BY MOUTH AT BEDTIME.   Vitamin D (Ergocalciferol) 1.25 MG (50000 UNIT) Caps capsule Commonly known as: DRISDOL Take 50,000 Units by mouth every Saturday.        Follow-up Information     Minna Merritts, MD Follow up in 7 day(s).   Specialty: Cardiology Contact information: [address] [place] [address] [phone]                Discharge Exam: Danley Danker Weights   04/11/22 2340  Weight: 82 kg   Constitutional:      Appearance: Normal appearance.  HENT:     Head: Normocephalic.     Nose: Nose normal.     Mouth/Throat:     Mouth: Mucous membranes are moist.  Eyes:     Conjunctiva/sclera: Conjunctivae normal.  Cardiovascular:     Rate and Rhythm: Rhythm irregular.  Pulmonary:     Effort: Pulmonary effort is normal.     Breath sounds: Normal breath sounds.  Abdominal:     General: Abdomen is flat. Bowel sounds are normal.     Palpations: Abdomen is soft.  Musculoskeletal:        General: Normal range of motion.     Cervical back: Normal range of motion.  Skin:    General: Skin is warm and dry.  Neurological:     General: No focal deficit present.     Mental Status: She is alert.  Psychiatric:        Mood and Affect: Mood normal.        Behavior: Behavior normal.     Condition at discharge: stable  The results of significant diagnostics from this hospitalization (including  imaging, microbiology, ancillary and laboratory) are listed below for reference.   Imaging Studies: DG Chest Portable 1 View  Result Date: 04/12/2022 CLINICAL DATA:  Atrial fibrillation.  Chest pain. EXAM: PORTABLE CHEST 1 VIEW COMPARISON:  01/23/2021 FINDINGS: Heart size and pulmonary vascularity  are normal. Lungs are clear. No pleural effusions. No pneumothorax. Mediastinal contours appear intact. Calcification of the aorta. IMPRESSION: No evidence of active pulmonary disease. Electronically Signed   By: Lucienne Capers M.D.   On: 04/12/2022 00:20    Microbiology: Results for orders placed or performed during the hospital encounter of 01/23/21  SARS CORONAVIRUS 2 (TAT 6-24 HRS) Nasopharyngeal Nasopharyngeal Swab     Status: None   Collection Time: 01/23/21 12:43 AM   Specimen: Nasopharyngeal Swab  Result Value Ref Range Status   SARS Coronavirus 2 NEGATIVE NEGATIVE Final    Comment: (NOTE) SARS-CoV-2 target nucleic acids are NOT DETECTED.  The SARS-CoV-2 RNA is generally detectable in upper and lower respiratory specimens during the acute phase of infection. Negative results do not preclude SARS-CoV-2 infection, do not rule out co-infections with other pathogens, and should not be used as the sole basis for treatment or other patient management decisions. Negative results must be combined with clinical observations, patient history, and epidemiological information. The expected result is Negative.  Fact Sheet for Patients: SugarRoll.be  Fact Sheet for Healthcare Providers: https://www.woods-mathews.com/  This test is not yet approved or cleared by the [place] FDA and  has been authorized for detection and/or diagnosis of SARS-CoV-2 by FDA under an Emergency Use Authorization (EUA). This EUA will remain  in effect (meaning this test can be used) for the duration of the COVID-19 declaration under Se ction 564(b)(1) of the Act, 21  U.S.C. section 360bbb-3(b)(1), unless the authorization is terminated or revoked sooner.  Performed at [facility], [place] [address], [place], [place] [address]   Culture, blood (routine x 2)     Status: None   Collection Time: 01/23/21  4:02 AM   Specimen: BLOOD  Result Value Ref Range Status   Specimen Description BLOOD LEFT [facility]  Final   Special Requests   Final    BOTTLES DRAWN AEROBIC AND ANAEROBIC Blood Culture adequate volume   Culture   Final    NO GROWTH 8 DAYS Performed at [facility], [address] [place], [place] [address]    Report Status 01/31/2021 FINAL  Final  Culture, blood (routine x 2)     Status: None   Collection Time: 01/23/21  4:13 AM   Specimen: BLOOD  Result Value Ref Range Status   Specimen Description BLOOD LEFT HAND  Final   Special Requests   Final    BOTTLES DRAWN AEROBIC AND ANAEROBIC Blood Culture adequate volume   Culture   Final    NO GROWTH 8 DAYS Performed at [facility], [address] [place], [place] [address]    Report Status 01/31/2021 FINAL  Final    Labs: CBC: Recent Labs  Lab 04/11/22 2347  WBC 9.3  NEUTROABS 8.3*  HGB 13.8  HCT 42.0  MCV 97.2  PLT 947   Basic Metabolic Panel: Recent Labs  Lab 04/11/22 2347  NA 139  K 4.4  CL 104  CO2 23  GLUCOSE 210*  BUN 28*  CREATININE 1.03*  CALCIUM 9.5  MG 2.4   Liver Function Tests: Recent Labs  Lab 04/11/22 2347  AST 45*  ALT 21  ALKPHOS 64  BILITOT 1.4*  PROT 7.4  ALBUMIN 4.3   CBG: Recent Labs  Lab 04/12/22 0907 04/12/22 1220  GLUCAP 206* 169*    Discharge time spent: greater than 30 minutes.  Signed: Collier Bullock, MD [facility] 04/12/2022

## 2022-04-12 NOTE — Assessment & Plan Note (Signed)
Continue carvedilol for blood pressure control

## 2022-04-12 NOTE — Consult Note (Signed)
Cardiology Consult    Patient ID: Bonnie Rowland MRN: [id number], DOB/AGE: Mar 30, 1941   Admit date: 04/11/2022 Date of Consult: 04/12/2022  Primary Physician: Steele Sizer, MD Primary Cardiologist: Ida Rogue, MD Requesting Provider: Dr. Francine Graven Reason for consult: Atrial fibrillation with RVR   Patient Profile    Bonnie Rowland is a 81 y.o. 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 Francine Graven.  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; left shoulder blade   Cancer ([place]) 05/2014   basal cell removed left temple   Cataract 2003   Cellulitis and abscess of leg, except foot    Conjunctivitis unspecified    Dermatophytosis of nail    Diabetes mellitus    type II   Elevated troponin    Esophageal reflux    Hyperlipidemia    Hypertension    Osteoporosis 2016   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)    Renal artery stenosis (HCC)    Sprain of lumbar region    Thoracic or lumbosacral neuritis or radiculitis, unspecified    Urinary tract infection, site not specified     Past Surgical History:  Procedure Laterality Date   APPENDECTOMY     CARDIAC CATHETERIZATION     CARDIOVERSION N/A 11/30/2020   Procedure: CARDIOVERSION;  Surgeon: Minna Merritts, MD;  Location: [facility];   Service: Cardiovascular;  Laterality: N/A;   CARDIOVERSION N/A 01/20/2021   Procedure: CARDIOVERSION;  Surgeon: Minna Merritts, MD;  Location: [facility];  Service: Cardiovascular;  Laterality: N/A;   CARDIOVERSION N/A 01/30/2021   Procedure: CARDIOVERSION;  Surgeon: Wellington Hampshire, MD;  Location: [facility];  Service: Cardiovascular;  Laterality: N/A;   cataract surgery     COLONOSCOPY     COLONOSCOPY WITH PROPOFOL N/A 05/09/2016   Procedure: COLONOSCOPY WITH PROPOFOL;  Surgeon: Robert Bellow, MD;  Location: [facility];  Service: Endoscopy;  Laterality: N/A;   DIAGNOSTIC MAMMOGRAM     EYE SURGERY Bilateral    Cataract Extraction   JOINT REPLACEMENT  2016   KNEE ARTHROPLASTY Right 03/30/2015   Procedure: COMPUTER ASSISTED TOTAL KNEE ARTHROPLASTY;  Surgeon: Dereck Leep, MD;  Location: [facility];  Service: Orthopedics;  Laterality: Right;   KNEE ARTHROSCOPY Right    KNEE CLOSED REDUCTION Right 05/23/2015   Procedure: CLOSED MANIPULATION KNEE;  Surgeon: Dereck Leep, MD;  Location: [facility];  Service: Orthopedics;  Laterality: Right;   RIGHT/LEFT HEART CATH AND CORONARY ANGIOGRAPHY N/A 01/25/2021   Procedure: RIGHT/LEFT HEART CATH AND CORONARY ANGIOGRAPHY;  Surgeon: Nelva Bush, MD;  Location: [facility];  Service: Cardiovascular;  Laterality: N/A;   TONSILLECTOMY       Allergies  Allergies  Allergen Reactions   Cyclobenzaprine Hypertension  Cheese Other (See Comments)    bloating   Ciprofloxacin Hcl Other (See Comments)    Muscle pain   Coconut (Cocos Nucifera)     Upset stomach   Keflex [Cephalexin] Other (See Comments)    Patient prefers not to take this medication due to the side effects, caused tendon issues    History of Present Illness    81 y.o. 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  She  was initially dx w/ post-op atrial flutter in 01/2015 following R TKA, but spontaneously converted to sinus rhythm.  She was briefly managed w/ flecainide.  In 08/2020, she was seen in clinic and noted to be in asymptomatic afib.  Echo showed an EF of 60-65% in 09/2020.  ? blocker was adjusted and eliquis added.  She subsequently underwent DCCV (150J  200J) in 11/2020 w/ conversion to sinus rhythm but was noted to have recurrent afib @ office f/u ~ 3 wks later.  She was placed on oral amiodarone and underwent repeat DCCV in late 12/2020.  She required admission shortly thereafter due to volume overload and recurrent palpitations/afib/flutter w/ hypertensive urgency.  Repeat echo showed an EF of 45-50%.  HsTrops rose to 481, and she underwent cath, which revealed mild to mod nonobstructive CAD.  Following diuresis and med optimization, she was successfully cardioverted.  She has been followed as an outpt since.  Amio dose has been limited to '100mg'$  daily in the setting of resting tremor that occurred on higher doses.  She was doing well at last outpt visit in Jan 2023 and again @ CHF clinic on 12/27/2021.  Yesterday received cortisone shot to her shoulder She reports that she developed chest discomfort and palpitations yesterday evening after dinner.  Waited for several hours before presenting to the emergency room.  EKG on arrival showing atrial fibrillation rate 150 bpm. Patient requested cardioversion, has been compliant with her anticoagulation.  Cardioversion performed in the emergency room maintaining normal sinus rhythm for 10 minutes but then shortly after converted to atrial flutter with elevated rate She had attempted repeat cardioversion 2-3  hours later, unsuccessful in restoring normal sinus rhythm ,remained in flutter Given dose of IV Lasix  At the time of my rounds resting in the hallway comfortably with ventricular rates 78-90.  No bed available to get her upstairs   Home Medications    Prior to  Admission medications   Medication Sig Start Date End Date Taking? Authorizing Provider  acetaminophen (TYLENOL) 650 MG CR tablet Take 1,300 mg by mouth at bedtime.   Yes [provider]  ALFALFA PO Take 1 tablet by mouth daily.   Yes [provider]  amiodarone (PACERONE) 200 MG tablet Take 0.5 tablets (100 mg total) by mouth daily. 04/12/21  Yes Dunn, Areta Haber, [name]  ascorbic Acid (VITAMIN C) 500 MG CPCR Take 500 mg by mouth daily.   Yes [provider]  B Complex-C (B-COMPLEX WITH VITAMIN C) tablet Take 1 tablet by mouth 2 (two) times daily.   Yes [provider]  Calcium Carbonate (CALCIUM 600 PO) Take 600 mg by mouth daily.   Yes [provider]  carvedilol (COREG) 3.125 MG tablet TAKE 1 TABLET BY MOUTH 2 TIMES DAILY. 01/29/22  Yes Minna Merritts, MD  cholecalciferol (VITAMIN D) 1000 UNITS tablet Take 1,000 Units by mouth  daily. [place]   Yes [provider]  clobetasol ointment (TEMOVATE) 7.61 % APPLY 1 APPLICATION TOPICALLY 2 TIMES DAILY AS NEEDED (BUG BITES). 02/12/22  Yes Sowles, Drue Stager, MD  Coenzyme Q10 (COQ10) 200 MG CAPS Take 200 mg by mouth daily.   Yes [provider]  ELIQUIS 5 MG TABS tablet TAKE 1 TABLET BY MOUTH TWICE A DAY 03/13/22  Yes Marie Chow, Kathlene November, MD  estradiol (ESTRACE VAGINAL) 0.1 MG/GM vaginal cream 1/2 gm once weekly using applicator, apply blueberry sized amount of cream using tip of finger to urethra twice weekly 07/06/21  Yes McGowan, Shannon A, [name]  FARXIGA 10 MG TABS tablet Take 10 mg by mouth daily. 03/30/22  Yes [provider]  fluconazole (DIFLUCAN) 150 MG tablet Take 1 tablet (150 mg total) by mouth every other day. 02/12/22  Yes Sowles, Drue Stager, MD  furosemide (LASIX) 40 MG tablet TAKE 1 TABLET BY MOUTH TWICE A DAY Patient taking differently: Take 80 mg by mouth daily. 08/06/21  Yes Hackney, Tina A, FNP  Glucosamine 500 MG TABS Take 500 mg by mouth daily.   Yes [provider]   HUMALOG KWIKPEN 100 UNIT/ML KiwkPen Inject 2-6 Units into the skin 3 (three) times daily before meals. If needed sliding scale 10/15/17  Yes [provider]  insulin glargine (LANTUS) 100 unit/mL SOPN Inject 14 Units into the skin at bedtime.   Yes [provider]  Javier Docker Oil 1000 MG CAPS Take 1,000 mg by mouth daily.   Yes [provider]  Mag Aspart-Potassium Aspart (POTASSIUM & MAGNESIUM ASPARTAT PO) Take 1 tablet by mouth daily.   Yes [provider]  miconazole (MICONAZOLE 3) 200 MG vaginal suppository Place 1 suppository (200 mg total) vaginally at bedtime. 01/19/22  Yes Scot Jun, FNP  Multiple Vitamins-Minerals (PRESERVISION AREDS 2) CAPS Take 1 capsule by mouth 2 (two) times daily.   Yes [provider]  mupirocin ointment (BACTROBAN) 2 % Apply 1 Application topically 2 (two) times daily. 01/23/22  Yes Sharion Balloon, NP  Probiotic Product (PROBIOTIC PO) Take 1 capsule by mouth at bedtime.   Yes [provider]  Propylene Glycol (SYSTANE BALANCE OP) Place 1 drop into both eyes daily as needed (dry eyes).   Yes [provider]  rosuvastatin (CRESTOR) 10 MG tablet TAKE 1 TABLET BY MOUTH EVERY DAY 03/18/22  Yes Sowles, Drue Stager, MD  sacubitril-valsartan (ENTRESTO) 49-51 MG Take 1 tablet by mouth 2 (two) times daily. 10/11/21  Yes Hackney, Otila Kluver A, FNP  Sodium Chloride-Sodium Bicarb (NETI POT SINUS WASH NA) Place 1 Dose into the nose at bedtime.   Yes [provider]  BD PEN NEEDLE NANO U/F 32G X 4 MM MISC USE 4 TIMES DAILY (HUMALOG 3 TIMES DAILY AND LANTUS ONCE DAILY) OFFICE NOTIFIED 08/06/17 12/07/18   Steele Sizer, MD  BLACK ELDERBERRY,BERRY-FLOWER, PO Take 15 mLs by mouth daily. Liquid    [provider]  Continuous Blood Gluc Sensor ([place]) MISC by Does not apply route.    [provider]  Elastic Bandages & Supports (MEDICAL COMPRESSION STOCKINGS) Wineglass 1 each by Does not apply  route daily. 01/08/19   Steele Sizer, MD  Exenatide ER (BYDUREON BCISE) 2 MG/0.85ML AUIJ Inject 2 mg into the skin every Sunday. 02/26/20   [provider]  OVER THE COUNTER MEDICATION Place 1 application into both eyes See admin instructions. Blephadex eyelid foam, apply to eyelids once daily    [provider]  OVER  THE COUNTER MEDICATION Take 1 tablet by mouth daily. Vita-Lea Gold otc supplement With out vitamin K (Shaklee products)    [provider]  OVER THE COUNTER MEDICATION Take 1 tablet by mouth See admin instructions. Nutriferon otc supplement take Take 1 tablet on Sun, Tues, Thurs, Sat. Take 1 tablet twice daily on Mon, Wed, and Fri    [provider]  PROLIA 60 MG/ML SOSY injection Inject 60 mg into the skin every 6 (six) months. 09/02/19   [provider]  tiZANidine (ZANAFLEX) 2 MG tablet TAKE 1-2 TABLETS (2-4 MG TOTAL) BY MOUTH AT BEDTIME. 02/20/22   Steele Sizer, MD  Vitamin D, Ergocalciferol, (DRISDOL) 1.25 MG (50000 UT) CAPS capsule Take 50,000 Units by mouth every Saturday. 11/23/18   [provider]  doxycycline (VIBRA-TABS) 100 MG tablet Take 1 tablet (100 mg total) by mouth 2 (two) times daily. 01/16/22   [name], DPM     Family History    Family History  Problem Relation Age of Onset   Diabetes Mother    Hypertension Mother    Cancer Mother    Kidney disease Mother    Hypertension Father    Cancer Father        Prostate   Breast cancer Maternal Aunt 2   Colon cancer Son    Kidney cancer Neg Hx    Bladder Cancer Neg Hx    She indicated that her mother is deceased. She indicated that her father is deceased. She indicated that her son is alive. She indicated that the status of her maternal aunt is unknown. She indicated that the status of her neg hx is unknown.   Social History    Social History   Socioeconomic History   Marital status: Married    Spouse name: Chalmer   Number of children: 1    Years of education: Not on file   Highest education level: Master's degree (e.g., MA, MS, MEng, MEd, MSW, MBA)  Occupational History   Occupation: Retired  Tobacco Use   Smoking status: Never   Smokeless tobacco: Never   Tobacco comments:    Smoking cessation materials not required  Vaping Use   Vaping Use: Never used  Substance and Sexual Activity   Alcohol use: Yes    Alcohol/week: 1.0 standard drink of alcohol    Types: 1 Glasses of wine per week    Comment: RARELY   Drug use: No   Sexual activity: Yes    Partners: Male    Birth control/protection: Post-menopausal  Other Topics Concern   Not on file  Social History Narrative   She is married , only has one son and he was diagnosed with colon cancer at age 73.    Social Determinants of Health   Financial Resource Strain: Low Risk  (12/21/2021)   Overall Financial Resource Strain (CARDIA)    Difficulty of Paying Living Expenses: Not very hard  Food Insecurity: No Food Insecurity (04/20/2021)   Hunger Vital Sign    Worried About Running Out of Food in the Last Year: Never true    Ran Out of Food in the Last Year: Never true  Transportation Needs: No Transportation Needs (12/21/2021)   PRAPARE - Hydrologist (Medical): No    Lack of Transportation (Non-Medical): No  Physical Activity: Sufficiently Active (04/20/2021)   Exercise Vital Sign    Days of Exercise per Week: 7 days    Minutes of Exercise per Session: 30 min  Stress: No Stress Concern Present (04/20/2021)   Kay    Feeling of Stress : Not at all  Social Connections: [place] (04/20/2021)   Social Connection and Isolation Panel [NHANES]    Frequency of Communication with Friends and Family: More than three times a week    Frequency of Social Gatherings with Friends and Family: More than three times a week    Attends Religious Services: More than 4 times  per year    Active Member of [employer] or Organizations: Yes    Attends Music therapist: More than 4 times per year    Marital Status: Married  Human resources officer Violence: Not At Risk (04/20/2021)   Humiliation, Afraid, Rape, and Kick questionnaire    Fear of Current or Ex-Partner: No    Emotionally Abused: No    Physically Abused: No    Sexually Abused: No     Review of Systems    General:  No chills, fever, night sweats or weight changes.  Cardiovascular:  No chest pain, dyspnea on exertion, edema, orthopnea, palpitations, paroxysmal nocturnal dyspnea. Dermatological: No rash, lesions/masses Respiratory: No cough, dyspnea Urologic: No hematuria, dysuria Abdominal:   No nausea, vomiting, diarrhea, bright red blood per rectum, melena, or hematemesis Neurologic:  No visual changes, wkns, changes in mental status. All other systems reviewed and are otherwise negative except as noted above.  Physical Exam    Blood pressure (!) 128/56, pulse 83, temperature 98 F (36.7 C), temperature source Oral, resp. rate 16, height '5\' 3"'$  (1.6 m), weight 82 kg, SpO2 98 %.  General: Pleasant, NAD Psych: Normal affect. Neuro: Alert and oriented X 3. Moves all extremities spontaneously. HEENT: Normal  Neck: Supple without bruits or JVD. Lungs:  Resp regular and unlabored, CTA. Heart: RRR no s3, s4, or murmurs. Abdomen: Soft, non-tender, non-distended, BS + x 4.  Extremities: No clubbing, cyanosis or edema. DP/PT2+, Radials 2+ and equal bilaterally.  Labs    Cardiac Enzymes Recent Labs  Lab 04/11/22 2347 04/12/22 0257  TROPONINIHS 11 33*     BNP    Component Value Date/Time   BNP 347.5 (H) 04/11/2022 2347    ProBNP No results found for: "PROBNP"  Lab Results  Component Value Date   WBC 9.3 04/11/2022   HGB 13.8 04/11/2022   HCT 42.0 04/11/2022   MCV 97.2 04/11/2022   PLT 199 04/11/2022    Recent Labs  Lab 04/11/22 2347  NA 139  K 4.4  CL 104  CO2 23  BUN 28*   CREATININE 1.03*  CALCIUM 9.5  PROT 7.4  BILITOT 1.4*  ALKPHOS 64  ALT 21  AST 45*  GLUCOSE 210*   Lab Results  Component Value Date   CHOL 127 07/12/2021   HDL 70 07/12/2021   LDLCALC 43 07/12/2021   TRIG 59 07/12/2021   No results found for: "DDIMER"    Radiology Studies    DG Chest Portable 1 View  Result Date: 04/12/2022 CLINICAL DATA:  Atrial fibrillation.  Chest pain. EXAM: PORTABLE CHEST 1 VIEW COMPARISON:  01/23/2021 FINDINGS: Heart size and pulmonary vascularity are normal. Lungs are clear. No pleural effusions. No pneumothorax. Mediastinal contours appear intact. Calcification of the aorta. IMPRESSION: No evidence of active pulmonary disease. Electronically Signed   By: Lucienne Capers M.D.   On: 04/12/2022 00:20    ECG & Cardiac Imaging    Initial EKG showing atrial fibrillation rate 150 bpm- personally reviewed. Subsequent  EKG showing atrial flutter ventricular rate 81 bpm  Assessment & Plan    Atrial fibrillation/flutter Cardioverted in the emergency room, to slower flutter Repeat cardioversion attempted in the emergency room by ER staff, unable to restore normal sinus rhythm -Discussed various treatment options with her such as amiodarone loading and repeat cardioversion in a week or 2 versus medical management at this time, discussed options with the EP -She is hesitant to increase her amiodarone given prior side effects including malaise, hair loss She prefers to discuss her situation with a EP In light of this she is currently rate controlled, recommended she increase carvedilol up to 6.25 twice daily continue amiodarone 100 daily, continue anticoagulation and continue Lasix 40 twice daily  Chest tightness Minimally elevated troponin likely supply demand mismatch in the setting of atrial fibrillation with RVR No ischemic work-up needed at this time, currently asymptomatic with better rate control  Chronic diastolic CHF Likely mild exacerbation in the  setting of atrial fibrillation with RVR Received IV Lasix in the emergency room, feels better, now with better rate control Continue oral Lasix 40 twice daily for now   Total encounter time more than 80 minutes  Greater than 50% was spent in counseling and coordination of care with the patient    Signed, Esmond Plants, MD, Ph.D [facility]   For questions or updates, please contact   Please consult [email or web address] for contact info under Cardiology/STEMI.

## 2022-04-12 NOTE — ED Notes (Signed)
Dr Gollan at bedside °

## 2022-04-12 NOTE — Assessment & Plan Note (Signed)
BMI 32 Complicates overall prognosis and care Lifestyle modification and exercise has been discussed with patient in detail

## 2022-04-12 NOTE — ED Notes (Signed)
Pt repositioned for breakfast, given warm blanket and water

## 2022-04-12 NOTE — Assessment & Plan Note (Signed)
Stable and not acutely exacerbated Last known LVEF of 60 to 65% from a 2D echocardiogram which was done in 09/23 Patient checks her weight daily and denies any weight gain Continue furosemide, carvedilol and Entresto

## 2022-04-12 NOTE — ED Notes (Signed)
Pt requesting pain medication, Dr Francine Graven made aware

## 2022-04-12 NOTE — ED Notes (Signed)
Time Out performed - right patient - right procedure.  Cardioversion procedure performed by EDP (Dr. Tamala Julian).  200J '@0417'$  EKG obtained and reviewed by EDP Smith.

## 2022-04-12 NOTE — Assessment & Plan Note (Addendum)
Patient presented to the ER for evaluation of palpitations and chest pressure and was found to be in rapid A-fib. She received metoprolol 5 mg IV without any significant improvement in her heart rate and had to be cardioverted twice at 200 J. She converted to sinus rhythm transiently but is back in A-fib atrial/flutter Continue carvedilol for rate control but dose has been increased from 3.125 twice daily to 6.25 Continue apixaban as primary prophylaxis for an acute stroke Appreciate cardiology input Follow up with cardiology for referral to electrophysiologist as an outpatient

## 2022-04-13 ENCOUNTER — Telehealth: Payer: Self-pay | Admitting: Cardiovascular Disease

## 2022-04-13 DIAGNOSIS — I480 Paroxysmal atrial fibrillation: Secondary | ICD-10-CM

## 2022-04-13 NOTE — Addendum Note (Signed)
Addended by: Alvis Lemmings C on: 04/13/2022 11:28 AM   Modules accepted: Orders

## 2022-04-13 NOTE — Telephone Encounter (Signed)
Our front desk rep- Pilar, advised me that she received a separate message from Dr. Rockey Situ- ok to schedule with an APP. She has called and advised the patient she can be seen on 04/27/22 in the [place] office with Ignacia Bayley, NP.  Per Pilar, the patient was ok with this.

## 2022-04-13 NOTE — Telephone Encounter (Signed)
Message received from Dr. Rockey Situ- he would like to go ahead and get an EP referral in place for her.  Referral order placed for Dr. Quentin Ore.   Bonnie Rowland- do you mind reaching out to her to go ahead and get this in place?  Dr. Rockey Situ discussed this with her at the hospital yesterday.   Thanks!

## 2022-04-13 NOTE — Telephone Encounter (Signed)
Secure chat to Dr. Rockey Situ to verify timing of follow up.

## 2022-04-13 NOTE — Telephone Encounter (Signed)
Patient called to schedule hospital follow up first available was 11/6.  She said that was too far, that she was to follow up in a week, he advised her that if there was nothing in a week to have someone speak with him and he would get her in.

## 2022-04-15 ENCOUNTER — Other Ambulatory Visit: Payer: Self-pay | Admitting: Family Medicine

## 2022-04-15 DIAGNOSIS — M62830 Muscle spasm of back: Secondary | ICD-10-CM

## 2022-04-16 DIAGNOSIS — M9903 Segmental and somatic dysfunction of lumbar region: Secondary | ICD-10-CM | POA: Diagnosis not present

## 2022-04-16 DIAGNOSIS — M5033 Other cervical disc degeneration, cervicothoracic region: Secondary | ICD-10-CM | POA: Diagnosis not present

## 2022-04-16 DIAGNOSIS — M5416 Radiculopathy, lumbar region: Secondary | ICD-10-CM | POA: Diagnosis not present

## 2022-04-16 DIAGNOSIS — M9901 Segmental and somatic dysfunction of cervical region: Secondary | ICD-10-CM | POA: Diagnosis not present

## 2022-04-17 DIAGNOSIS — M19011 Primary osteoarthritis, right shoulder: Secondary | ICD-10-CM | POA: Diagnosis not present

## 2022-04-17 DIAGNOSIS — M25511 Pain in right shoulder: Secondary | ICD-10-CM | POA: Diagnosis not present

## 2022-04-17 DIAGNOSIS — M6281 Muscle weakness (generalized): Secondary | ICD-10-CM | POA: Diagnosis not present

## 2022-04-17 DIAGNOSIS — G8929 Other chronic pain: Secondary | ICD-10-CM | POA: Diagnosis not present

## 2022-04-17 DIAGNOSIS — M7521 Bicipital tendinitis, right shoulder: Secondary | ICD-10-CM | POA: Diagnosis not present

## 2022-04-17 DIAGNOSIS — M7551 Bursitis of right shoulder: Secondary | ICD-10-CM | POA: Diagnosis not present

## 2022-04-17 DIAGNOSIS — Z23 Encounter for immunization: Secondary | ICD-10-CM | POA: Diagnosis not present

## 2022-04-17 DIAGNOSIS — M7541 Impingement syndrome of right shoulder: Secondary | ICD-10-CM | POA: Diagnosis not present

## 2022-04-17 DIAGNOSIS — M778 Other enthesopathies, not elsewhere classified: Secondary | ICD-10-CM | POA: Diagnosis not present

## 2022-04-18 ENCOUNTER — Institutional Professional Consult (permissible substitution): Payer: Medicare Other | Admitting: Cardiology

## 2022-04-19 ENCOUNTER — Encounter: Payer: Self-pay | Admitting: Podiatry

## 2022-04-19 ENCOUNTER — Telehealth: Payer: Medicare Other | Admitting: Nurse Practitioner

## 2022-04-19 ENCOUNTER — Ambulatory Visit (INDEPENDENT_AMBULATORY_CARE_PROVIDER_SITE_OTHER): Payer: Medicare Other | Admitting: Podiatry

## 2022-04-19 DIAGNOSIS — J0100 Acute maxillary sinusitis, unspecified: Secondary | ICD-10-CM

## 2022-04-19 DIAGNOSIS — I701 Atherosclerosis of renal artery: Secondary | ICD-10-CM

## 2022-04-19 DIAGNOSIS — Q828 Other specified congenital malformations of skin: Secondary | ICD-10-CM | POA: Diagnosis not present

## 2022-04-19 DIAGNOSIS — Z7901 Long term (current) use of anticoagulants: Secondary | ICD-10-CM

## 2022-04-19 MED ORDER — AMOXICILLIN-POT CLAVULANATE 875-125 MG PO TABS: 1.0000 | ORAL_TABLET | Freq: Two times a day (BID) | ORAL | 0 refills | Status: DC

## 2022-04-19 NOTE — Progress Notes (Signed)
This patient presents to the office saying she has developed pain on the outside of her right midfoot.  She has been walking for exercise and her pain is worsening.  She says she has painful callus. She was dispensed padding for her to wear in her shoes which have worked well.  She requests additional padding. She presents for evaluation and treatment.  General Appearance  Alert, conversant and in no acute stress.  Vascular  Dorsalis pedis and posterior tibial  pulses are palpable  bilaterally.  Capillary return is within normal limits  bilaterally. Temperature is within normal limits  bilaterally.  Neurologic  Senn-Weinstein monofilament wire test within normal limits  bilaterally. Muscle power within normal limits bilaterally.  Nails Thick disfigured discolored nails with subungual debris  from hallux to fifth toes bilaterally. No evidence of bacterial infection or drainage bilaterally.  Orthopedic  No limitations of motion  feet .  No crepitus or effusions noted.  No bony pathology or digital deformities noted.  Palpable pain fifth metabase right foot.  Skin  normotropic skin with no porokeratosis noted bilaterally.  No signs of infections or ulcers noted.     Contusion /  Porokeratosis right foot.  ROV.  Discussed this condition with this patient.  Examined her shoes and they were not stiff enough.  Also applied padding to her right shoe.   RTC 3 months   [name] DPM

## 2022-04-19 NOTE — Progress Notes (Signed)
Virtual Visit Consent   Bonnie Rowland, you are scheduled for a virtual visit with Mary-Margaret Hassell Done, [place], a [facility] provider, today.     Just as with appointments in the office, your consent must be obtained to participate.  Your consent will be active for this visit and any virtual visit you may have with one of our providers in the next 365 days.     If you have a MyChart account, a copy of this consent can be sent to you electronically.  All virtual visits are billed to your insurance company just like a traditional visit in the office.    As this is a virtual visit, video technology does not allow for your provider to perform a traditional examination.  This may limit your provider's ability to fully assess your condition.  If your provider identifies any concerns that need to be evaluated in person or the need to arrange testing (such as labs, EKG, etc.), we will make arrangements to do so.     Although advances in technology are sophisticated, we cannot ensure that it will always work on either your end or our end.  If the connection with a video visit is poor, the visit may have to be switched to a telephone visit.  With either a video or telephone visit, we are not always able to ensure that we have a secure connection.     I need to obtain your verbal consent now.   Are you willing to proceed with your visit today? YES   JOURDAN MALDONADO has provided verbal consent on 04/19/2022 for a virtual visit (video or telephone).   Mary-Margaret Hassell Done, FNP   Date: 04/19/2022 6:24 PM   Virtual Visit via Video Note   I, Mary-Margaret Hassell Done, connected with Bonnie Rowland ([id number], 10-01-1940) on 04/19/22 at  7:00 PM EDT by a video-enabled telemedicine application and verified that I am speaking with the correct person using two identifiers.  Location: Patient: Virtual Visit Location Patient: Home Provider: Virtual Visit Location Provider: Mobile   I discussed the  limitations of evaluation and management by telemedicine and the availability of in person appointments. The patient expressed understanding and agreed to proceed.    History of Present Illness: Bonnie Rowland is a 81 y.o. who identifies as a female who was assigned female at birth, and is being seen today for uri.  HPI: Patient having URI symptoms. Has a big event to go to Saturday and needs to be well.  URI  This is a new problem. The current episode started in the past 7 days. The problem has been gradually worsening. There has been no fever. Associated symptoms include congestion, headaches, rhinorrhea, sinus pain and a sore throat. She has tried acetaminophen for the symptoms. The treatment provided mild relief.    Review of Systems  HENT:  Positive for congestion, rhinorrhea, sinus pain and sore throat.   Neurological:  Positive for headaches.    Problems:  Patient Active Problem List   Diagnosis Date Noted   Rapid atrial fibrillation ([place]) 04/12/2022   Obesity (BMI 30-39.9)    Contusion of foot 03/08/2022   Pain due to onychomycosis of toenails of both feet 09/07/2021   Chronic diastolic CHF (congestive heart failure) (HCC)    AF (paroxysmal atrial fibrillation) ([place]) 01/23/2021   Controlled type 2 diabetes mellitus without complication, without long-term current use of insulin ([place]) 01/23/2021   Plantar fasciitis, bilateral 05/21/2019   Age-related osteoporosis without current  pathological fracture 11/05/2017   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([place]) 07/09/2017   Atherosclerosis of abdominal aorta ([place]) 07/09/2017   Bilateral carotid bruits 08/24/2015   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   Chronic pain 01/10/2015   History of melanoma excision 01/10/2015   Spinal stenosis, lumbar region, 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   DDD (degenerative disc disease), lumbar 11/14/2014   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   Hyperlipemia 11/07/2009   Hypertension, benign 11/07/2009    Atherosclerosis of renal artery ([place]) 11/07/2009   Perennial allergic rhinitis 11/27/2007   Lumbosacral neuritis 01/17/2007    Allergies:  Allergies  Allergen Reactions   Cyclobenzaprine Hypertension   Cheese Other (See Comments)    bloating   Ciprofloxacin Hcl Other (See Comments)    Muscle pain   Coconut (Cocos Nucifera)     Upset stomach   Keflex [Cephalexin] Other (See Comments)    Patient prefers not to take this medication due to the side effects, caused tendon issues   Medications:  Current Outpatient Medications:    acetaminophen (TYLENOL) 650 MG CR tablet, Take 1,300 mg by mouth at bedtime., Disp: , Rfl:    ALFALFA PO, Take 1 tablet by mouth daily., Disp: , Rfl:    amiodarone (PACERONE) 200 MG tablet, Take 0.5 tablets (100 mg total) by mouth daily., Disp: 90 tablet, Rfl: 3   ascorbic Acid (VITAMIN C) 500 MG CPCR, Take 500 mg by mouth daily., Disp: , Rfl:    B Complex-C (B-COMPLEX WITH VITAMIN C) tablet, Take 1 tablet by mouth 2 (two) times daily., Disp: , Rfl:    BD PEN NEEDLE NANO U/F 32G X 4 MM MISC, USE 4 TIMES DAILY (HUMALOG 3 TIMES DAILY AND LANTUS ONCE DAILY) OFFICE NOTIFIED 08/06/17, Disp: 90 each, Rfl: 1   BLACK ELDERBERRY,BERRY-FLOWER, PO, Take 15 mLs by mouth daily. Liquid, Disp: , Rfl:    Calcium Carbonate (CALCIUM 600 PO), Take 600 mg by mouth daily., Disp: , Rfl:    carvedilol (COREG) 6.25 MG tablet, Take 1 tablet (6.25 mg total) by mouth 2 (two) times daily with a meal., Disp: 60 tablet, Rfl: 2   cholecalciferol (VITAMIN D) 1000 UNITS tablet, Take 1,000 Units by mouth daily. [place], Disp: , Rfl:    clobetasol ointment (TEMOVATE) 2.37 %, APPLY 1 APPLICATION TOPICALLY 2 TIMES DAILY AS NEEDED (BUG BITES)., Disp: 30 g, Rfl: 0   Coenzyme Q10 (COQ10) 200 MG CAPS, Take 200 mg by mouth daily., Disp: , Rfl:    Continuous Blood Gluc Sensor ([place]) MISC, by Does not apply route., Disp: , Rfl:    Elastic Bandages & Supports ([place]) MISC, 1 each by Does not apply route daily., Disp: 2 each, Rfl: 5   ELIQUIS 5 MG TABS tablet, TAKE 1 TABLET BY MOUTH TWICE A DAY, Disp: 180 tablet, Rfl: 1   estradiol (ESTRACE VAGINAL) 0.1 MG/GM vaginal cream, 1/2 gm once weekly using applicator, apply blueberry sized amount of cream using tip of finger to urethra twice weekly, Disp: 30 g, Rfl: 3   Exenatide ER (BYDUREON BCISE) 2 MG/0.85ML AUIJ, Inject 2 mg into the skin every Sunday., Disp: , Rfl:    FARXIGA 10 MG TABS tablet, Take 10 mg by mouth daily., Disp: , Rfl:    furosemide (LASIX) 40 MG tablet, TAKE 1 TABLET BY MOUTH TWICE A DAY (Patient taking differently: Take 80 mg by mouth daily.), Disp: 180 tablet, Rfl: 3   Glucosamine 500 MG TABS, Take 500 mg by mouth daily., Disp: ,  Rfl:    HUMALOG KWIKPEN 100 UNIT/ML KiwkPen, Inject 2-6 Units into the skin 3 (three) times daily before meals. If needed sliding scale, Disp: , Rfl: 3   insulin glargine (LANTUS) 100 unit/mL SOPN, Inject 14 Units into the skin at bedtime., Disp: , Rfl:    Krill Oil 1000 MG CAPS, Take 1,000 mg by mouth daily., Disp: , Rfl:    Mag Aspart-Potassium Aspart (POTASSIUM & MAGNESIUM ASPARTAT PO), Take 1 tablet by mouth daily., Disp: , Rfl:    Multiple Vitamins-Minerals (PRESERVISION AREDS 2) CAPS, Take 1 capsule by mouth 2 (two) times daily., Disp: , Rfl:    mupirocin ointment (BACTROBAN) 2 %, Apply 1 Application topically 2 (two) times daily., Disp: 22 g, Rfl: 0   OVER THE COUNTER MEDICATION, Place 1 application into both eyes See admin instructions. Blephadex eyelid foam, apply to eyelids once daily, Disp: , Rfl:    OVER THE COUNTER MEDICATION, Take 1 tablet by mouth daily. Vita-Lea Gold otc supplement With out vitamin K ([place]aklee products), Disp: , Rfl:    OVER THE COUNTER MEDICATION, Take 1 tablet by mouth See admin instructions. Nutriferon otc supplement take Take 1 tablet on Sun, Tues, Thurs, Sat. Take 1 tablet twice daily on Mon, Wed, and Fri, Disp: , Rfl:     Probiotic Product (PROBIOTIC PO), Take 1 capsule by mouth at bedtime., Disp: , Rfl:    PROLIA 60 MG/ML SOSY injection, Inject 60 mg into the skin every 6 (six) months., Disp: , Rfl:    Propylene Glycol (SYSTANE BALANCE OP), Place 1 drop into both eyes daily as needed (dry eyes)., Disp: , Rfl:    rosuvastatin (CRESTOR) 10 MG tablet, TAKE 1 TABLET BY MOUTH EVERY DAY, Disp: 90 tablet, Rfl: 1   sacubitril-valsartan (ENTRESTO) 49-51 MG, Take 1 tablet by mouth 2 (two) times daily., Disp: 180 tablet, Rfl: 3   Sodium Chloride-Sodium Bicarb (NETI POT SINUS WASH NA), Place 1 Dose into the nose at bedtime., Disp: , Rfl:    tiZANidine (ZANAFLEX) 2 MG tablet, TAKE 1-2 TABLETS (2-4 MG TOTAL) BY MOUTH AT BEDTIME., Disp: 110 tablet, Rfl: 0   Vitamin D, Ergocalciferol, (DRISDOL) 1.25 MG (50000 UT) CAPS capsule, Take 50,000 Units by mouth every Saturday., Disp: , Rfl:   Observations/Objective: Patient is well-developed, well-nourished in no acute distress.  Resting comfortably  at home.  Head is normocephalic, atraumatic.  No labored breathing.  Speech is clear and coherent with logical content.  Patient is alert and oriented at baseline.  Raspy voice  Assessment and Plan:  Meds ordered this encounter  Medications   amoxicillin-clavulanate (AUGMENTIN) 875-125 MG tablet    Sig: Take 1 tablet by mouth 2 (two) times daily.    Dispense:  14 tablet    Refill:  0    Order Specific Question:   Supervising Provider    Answer:   Chase Picket [id number]     Follow Up Instructions: I discussed the assessment and treatment plan with the patient. The patient was provided an opportunity to ask questions and all were answered. The patient agreed with the plan and demonstrated an understanding of the instructions.  A copy of instructions were sent to the patient via MyChart.  The patient was advised to call back or seek an in-person evaluation if the symptoms worsen or if the condition fails to improve as  anticipated.  Time:  I spent 7 minutes with the patient via telehealth technology discussing the above problems/concerns.    Mary-Margaret Hassell Done,  FNP  

## 2022-04-19 NOTE — Patient Instructions (Signed)
Bonnie Rowland, thank you for joining [name], FNP for today's virtual visit.  While this provider is not your primary care provider (PCP), if your PCP is located in our provider database this encounter information will be shared with them immediately following your visit.   [facility] account gives you access to today's visit and all your visits, tests, and labs performed at [facility] " click here if you don't have a [facility] account or go to [email or web address]  Consent: (Patient) Bonnie Rowland provided verbal consent for this virtual visit at the beginning of the encounter.  Current Medications:  Current Outpatient Medications:    amoxicillin-clavulanate (AUGMENTIN) 875-125 MG tablet, Take 1 tablet by mouth 2 (two) times daily., Disp: 14 tablet, Rfl: 0   acetaminophen (TYLENOL) 650 MG CR tablet, Take 1,300 mg by mouth at bedtime., Disp: , Rfl:    ALFALFA PO, Take 1 tablet by mouth daily., Disp: , Rfl:    amiodarone (PACERONE) 200 MG tablet, Take 0.5 tablets (100 mg total) by mouth daily., Disp: 90 tablet, Rfl: 3   ascorbic Acid (VITAMIN C) 500 MG CPCR, Take 500 mg by mouth daily., Disp: , Rfl:    B Complex-C (B-COMPLEX WITH VITAMIN C) tablet, Take 1 tablet by mouth 2 (two) times daily., Disp: , Rfl:    BD PEN NEEDLE NANO U/F 32G X 4 MM MISC, USE 4 TIMES DAILY (HUMALOG 3 TIMES DAILY AND LANTUS ONCE DAILY) OFFICE NOTIFIED 08/06/17, Disp: 90 each, Rfl: 1   BLACK ELDERBERRY,BERRY-FLOWER, PO, Take 15 mLs by mouth daily. Liquid, Disp: , Rfl:    Calcium Carbonate (CALCIUM 600 PO), Take 600 mg by mouth daily., Disp: , Rfl:    carvedilol (COREG) 6.25 MG tablet, Take 1 tablet (6.25 mg total) by mouth 2 (two) times daily with a meal., Disp: 60 tablet, Rfl: 2   cholecalciferol (VITAMIN D) 1000 UNITS tablet, Take 1,000 Units by mouth daily. [place], Disp: , Rfl:    clobetasol ointment (TEMOVATE) 0.03 %, APPLY 1 APPLICATION TOPICALLY 2 TIMES  DAILY AS NEEDED (BUG BITES)., Disp: 30 g, Rfl: 0   Coenzyme Q10 (COQ10) 200 MG CAPS, Take 200 mg by mouth daily., Disp: , Rfl:    Continuous Blood Gluc Sensor ([place]) MISC, by Does not apply route., Disp: , Rfl:    Elastic Bandages & Supports ([place]) MISC, 1 each by Does not apply route daily., Disp: 2 each, Rfl: 5   ELIQUIS 5 MG TABS tablet, TAKE 1 TABLET BY MOUTH TWICE A DAY, Disp: 180 tablet, Rfl: 1   estradiol (ESTRACE VAGINAL) 0.1 MG/GM vaginal cream, 1/2 gm once weekly using applicator, apply blueberry sized amount of cream using tip of finger to urethra twice weekly, Disp: 30 g, Rfl: 3   Exenatide ER (BYDUREON BCISE) 2 MG/0.85ML AUIJ, Inject 2 mg into the skin every Sunday., Disp: , Rfl:    FARXIGA 10 MG TABS tablet, Take 10 mg by mouth daily., Disp: , Rfl:    furosemide (LASIX) 40 MG tablet, TAKE 1 TABLET BY MOUTH TWICE A DAY (Patient taking differently: Take 80 mg by mouth daily.), Disp: 180 tablet, Rfl: 3   Glucosamine 500 MG TABS, Take 500 mg by mouth daily., Disp: , Rfl:    HUMALOG KWIKPEN 100 UNIT/ML KiwkPen, Inject 2-6 Units into the skin 3 (three) times daily before meals. If needed sliding scale, Disp: , Rfl: 3   insulin glargine (LANTUS) 100 unit/mL SOPN, Inject 14 Units  into the skin at bedtime., Disp: , Rfl:    Krill Oil 1000 MG CAPS, Take 1,000 mg by mouth daily., Disp: , Rfl:    Mag Aspart-Potassium Aspart (POTASSIUM & MAGNESIUM ASPARTAT PO), Take 1 tablet by mouth daily., Disp: , Rfl:    Multiple Vitamins-Minerals (PRESERVISION AREDS 2) CAPS, Take 1 capsule by mouth 2 (two) times daily., Disp: , Rfl:    mupirocin ointment (BACTROBAN) 2 %, Apply 1 Application topically 2 (two) times daily., Disp: 22 g, Rfl: 0   OVER THE COUNTER MEDICATION, Place 1 application into both eyes See admin instructions. Blephadex eyelid foam, apply to eyelids once daily, Disp: , Rfl:    OVER THE COUNTER MEDICATION, Take 1 tablet by mouth daily. Vita-Lea  Gold otc supplement With out vitamin K (Shaklee products), Disp: , Rfl:    OVER THE COUNTER MEDICATION, Take 1 tablet by mouth See admin instructions. Nutriferon otc supplement take Take 1 tablet on Sun, Tues, Thurs, Sat. Take 1 tablet twice daily on Mon, Wed, and Fri, Disp: , Rfl:    Probiotic Product (PROBIOTIC PO), Take 1 capsule by mouth at bedtime., Disp: , Rfl:    PROLIA 60 MG/ML SOSY injection, Inject 60 mg into the skin every 6 (six) months., Disp: , Rfl:    Propylene Glycol (SYSTANE BALANCE OP), Place 1 drop into both eyes daily as needed (dry eyes)., Disp: , Rfl:    rosuvastatin (CRESTOR) 10 MG tablet, TAKE 1 TABLET BY MOUTH EVERY DAY, Disp: 90 tablet, Rfl: 1   sacubitril-valsartan (ENTRESTO) 49-51 MG, Take 1 tablet by mouth 2 (two) times daily., Disp: 180 tablet, Rfl: 3   Sodium Chloride-Sodium Bicarb (NETI POT SINUS WASH NA), Place 1 Dose into the nose at bedtime., Disp: , Rfl:    tiZANidine (ZANAFLEX) 2 MG tablet, TAKE 1-2 TABLETS (2-4 MG TOTAL) BY MOUTH AT BEDTIME., Disp: 110 tablet, Rfl: 0   Vitamin D, Ergocalciferol, (DRISDOL) 1.25 MG (50000 UT) CAPS capsule, Take 50,000 Units by mouth every Saturday., Disp: , Rfl:    Medications ordered in this encounter:  Meds ordered this encounter  Medications   amoxicillin-clavulanate (AUGMENTIN) 875-125 MG tablet    Sig: Take 1 tablet by mouth 2 (two) times daily.    Dispense:  14 tablet    Refill:  0    Order Specific Question:   Supervising Provider    Answer:   Chase Picket [id number]     *If you need refills on other medications prior to your next appointment, please contact your pharmacy*  Follow-Up: Call back or seek an in-person evaluation if the symptoms worsen or if the condition fails to improve as anticipated.  [place] [phone]  Other Instructions 1. Take meds as prescribed 2. Use a cool mist humidifier especially during the winter months and when heat has been humid. 3. Use saline nose  sprays frequently 4. Saline irrigations of the nose can be very helpful if done frequently.  * 4X daily for 1 week*  * Use of a nettie pot can be helpful with this. Follow directions with this* 5. Drink plenty of fluids 6. Keep thermostat turn down low 7.For any cough or congestion- mucinex OTC 8. For fever or aces or pains- take tylenol or ibuprofen appropriate for age and weight.  * for fevers greater than 101 orally you may alternate ibuprofen and tylenol every  3 hours.      If you have been instructed to have an in-person evaluation today at  a local Urgent Care facility, please use the link below. It will take you to a list of all of our available [facility], including address, phone number and hours of operation. Please do not delay care.  [facility]  If you or a family member do not have a primary care provider, use the link below to schedule a visit and establish care. When you choose a [facility] primary care physician or advanced practice provider, you gain a long-term partner in health. Find a Primary Care Provider  Learn more about [facility] in-office and virtual care options: [facility]

## 2022-04-20 DIAGNOSIS — G8929 Other chronic pain: Secondary | ICD-10-CM | POA: Diagnosis not present

## 2022-04-20 DIAGNOSIS — M25511 Pain in right shoulder: Secondary | ICD-10-CM | POA: Diagnosis not present

## 2022-04-20 DIAGNOSIS — M778 Other enthesopathies, not elsewhere classified: Secondary | ICD-10-CM | POA: Diagnosis not present

## 2022-04-20 DIAGNOSIS — M7541 Impingement syndrome of right shoulder: Secondary | ICD-10-CM | POA: Diagnosis not present

## 2022-04-20 DIAGNOSIS — M6281 Muscle weakness (generalized): Secondary | ICD-10-CM | POA: Diagnosis not present

## 2022-04-20 DIAGNOSIS — M19011 Primary osteoarthritis, right shoulder: Secondary | ICD-10-CM | POA: Diagnosis not present

## 2022-04-23 DIAGNOSIS — M5416 Radiculopathy, lumbar region: Secondary | ICD-10-CM | POA: Diagnosis not present

## 2022-04-23 DIAGNOSIS — M9901 Segmental and somatic dysfunction of cervical region: Secondary | ICD-10-CM | POA: Diagnosis not present

## 2022-04-23 DIAGNOSIS — M9903 Segmental and somatic dysfunction of lumbar region: Secondary | ICD-10-CM | POA: Diagnosis not present

## 2022-04-23 DIAGNOSIS — M5033 Other cervical disc degeneration, cervicothoracic region: Secondary | ICD-10-CM | POA: Diagnosis not present

## 2022-04-24 ENCOUNTER — Ambulatory Visit: Payer: Medicare Other

## 2022-04-24 DIAGNOSIS — M6281 Muscle weakness (generalized): Secondary | ICD-10-CM | POA: Diagnosis not present

## 2022-04-24 DIAGNOSIS — M25511 Pain in right shoulder: Secondary | ICD-10-CM | POA: Diagnosis not present

## 2022-04-24 DIAGNOSIS — M7541 Impingement syndrome of right shoulder: Secondary | ICD-10-CM | POA: Diagnosis not present

## 2022-04-24 DIAGNOSIS — M778 Other enthesopathies, not elsewhere classified: Secondary | ICD-10-CM | POA: Diagnosis not present

## 2022-04-24 DIAGNOSIS — M19011 Primary osteoarthritis, right shoulder: Secondary | ICD-10-CM | POA: Diagnosis not present

## 2022-04-24 DIAGNOSIS — G8929 Other chronic pain: Secondary | ICD-10-CM | POA: Diagnosis not present

## 2022-04-25 ENCOUNTER — Other Ambulatory Visit: Payer: Self-pay | Admitting: Family Medicine

## 2022-04-25 ENCOUNTER — Telehealth: Payer: Self-pay | Admitting: Family Medicine

## 2022-04-25 DIAGNOSIS — B3731 Acute candidiasis of vulva and vagina: Secondary | ICD-10-CM

## 2022-04-25 MED ORDER — CLOTRIMAZOLE 1 % VA CREA: 1.0000 | TOPICAL_CREAM | Freq: Every day | VAGINAL | 0 refills | Status: DC

## 2022-04-25 NOTE — Telephone Encounter (Signed)
Medication Refill - Medication: vaginal suppositories for yeast, miconazole  Has the patient contacted their pharmacy? No. Pt wants due to being on Farxiga and abx same time.  This may happen again, and she wants to have this on hand. Pt has  fluconazole , she wants vaginal supp.  Preferred Pharmacy (with phone number or street name): [id number] been [place] for an appointment in the last year OR does the patient have an upcoming appointment? Yes.    Agent: Please be advised that RX refills may take up to 3 business days. We ask that you follow-up with your pharmacy.

## 2022-04-25 NOTE — Telephone Encounter (Signed)
Called patient and made aware.

## 2022-04-26 ENCOUNTER — Ambulatory Visit (INDEPENDENT_AMBULATORY_CARE_PROVIDER_SITE_OTHER): Payer: Medicare Other

## 2022-04-26 DIAGNOSIS — M7541 Impingement syndrome of right shoulder: Secondary | ICD-10-CM | POA: Diagnosis not present

## 2022-04-26 DIAGNOSIS — M19011 Primary osteoarthritis, right shoulder: Secondary | ICD-10-CM | POA: Diagnosis not present

## 2022-04-26 DIAGNOSIS — M25511 Pain in right shoulder: Secondary | ICD-10-CM | POA: Diagnosis not present

## 2022-04-26 DIAGNOSIS — Z0000 Encounter for general adult medical examination without abnormal findings: Secondary | ICD-10-CM | POA: Diagnosis not present

## 2022-04-26 DIAGNOSIS — M778 Other enthesopathies, not elsewhere classified: Secondary | ICD-10-CM | POA: Diagnosis not present

## 2022-04-26 DIAGNOSIS — G8929 Other chronic pain: Secondary | ICD-10-CM | POA: Diagnosis not present

## 2022-04-26 DIAGNOSIS — M6281 Muscle weakness (generalized): Secondary | ICD-10-CM | POA: Diagnosis not present

## 2022-04-26 NOTE — Progress Notes (Signed)
Cardiology Clinic Note   Patient Name: Bonnie Rowland Date of Encounter: 04/27/2022  Primary Care Provider:  Steele Sizer, MD Primary Cardiologist:  Bonnie Rogue, MD  Patient Profile    [name] is a 81 year-old female with a past medical history of nonobstructive CAD, chronic combined systolic and diastolic CHF, NICM, persistent A-fib s/p DCCV x2 in 2022, atrial flutter, left renal stenosis, pulmonary hypertension, hypertension, hyperlipidemia, and T2DM who presents to the clinic today for hospital follow-up.  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; left shoulder blade   Cancer ([place]) 05/2014   basal cell removed left temple   Cataract 2003   Cellulitis and abscess of leg, except foot    Conjunctivitis unspecified    Dermatophytosis of nail    Diabetes mellitus    type II   Elevated troponin    Esophageal reflux    Hyperlipidemia    Hypertension    Osteoporosis 2016   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)    Renal artery stenosis (HCC)    Sprain of lumbar region    Thoracic or lumbosacral neuritis or radiculitis, unspecified    Urinary tract infection, site not specified    Past Surgical History:  Procedure Laterality Date   APPENDECTOMY     CARDIAC CATHETERIZATION     CARDIOVERSION N/A 11/30/2020   Procedure: CARDIOVERSION;  Surgeon: Bonnie Merritts, MD;  Location: [facility];  Service: Cardiovascular;  Laterality: N/A;   CARDIOVERSION N/A 01/20/2021   Procedure: CARDIOVERSION;  Surgeon: Bonnie Merritts, MD;  Location: [facility];  Service: Cardiovascular;  Laterality: N/A;   CARDIOVERSION N/A 01/30/2021   Procedure: CARDIOVERSION;  Surgeon: Bonnie Hampshire, MD;  Location: [facility];  Service: Cardiovascular;  Laterality: N/A;   cataract surgery     COLONOSCOPY     COLONOSCOPY WITH PROPOFOL N/A 05/09/2016   Procedure: COLONOSCOPY WITH PROPOFOL;  Surgeon: Bonnie Bellow, MD;  Location: [facility];  Service: Endoscopy;  Laterality: N/A;   DIAGNOSTIC MAMMOGRAM     EYE SURGERY Bilateral    Cataract Extraction   JOINT REPLACEMENT  2016   KNEE ARTHROPLASTY Right 03/30/2015   Procedure: COMPUTER ASSISTED TOTAL KNEE ARTHROPLASTY;  Surgeon: Bonnie Leep, MD;  Location: [facility];  Service: Orthopedics;  Laterality: Right;   KNEE ARTHROSCOPY Right    KNEE CLOSED REDUCTION Right 05/23/2015   Procedure: CLOSED MANIPULATION KNEE;  Surgeon: Bonnie Leep, MD;  Location: [facility];  Service: Orthopedics;  Laterality: Right;   RIGHT/LEFT HEART CATH AND CORONARY ANGIOGRAPHY N/A 01/25/2021   Procedure: RIGHT/LEFT HEART CATH AND CORONARY ANGIOGRAPHY;  Surgeon: Bonnie Bush, MD;  Location: [facility];  Service: Cardiovascular;  Laterality: N/A;   TONSILLECTOMY      Allergies  Allergies  Allergen Reactions   Cyclobenzaprine Hypertension   Cheese Other (See Comments)    bloating   Ciprofloxacin Hcl Other (See Comments)    Muscle pain   Coconut (Cocos Nucifera)  Upset stomach   Keflex [Cephalexin] Other (See Comments)    Patient prefers not to take this medication due to the side effects, caused tendon issues    History of Present Illness    [name] has a past medical history of: Nonobstructive CAD. Right and left heart cath 01/25/2021: Mid LAD 30%, mid/distal LCx 50 to 60%, proximal/mid RCA 60%.  Mildly elevated left heart filling pressures.  Moderately elevated right heart filling and pulmonary artery pressures with significant pulmonary vascular  resistance. Combined chronic systolic and diastolic heart failure.  Echo 01/23/2021:[facility]  Left ventricle with mildly decreased function and regional wall motion abnormalities.  Mild LVH.  Severe hypokinesis of the left ventricular entire anterior wall anterior septal wall.  Mitral valve is degenerative with trivial regurgitation. Echo 03/31/2021: EF 60 to 65%.  Moderate mitral annular calcification. Currently on lasix 40 mg twice daily, Entresto 49/51 mg and Farxiga.  Renal artery stenosis. Renal ultrasound 09/14/2019: Right: Normal size right kidney. Normal right Resisitive Index. Normal cortical thickness of right kidney. No evidence of right renal artery stenosis. RRV flow present.  Left:  Cyst(s) noted. 1-59% stenosis of the left renal artery.  Normal size of left kidney. Normal left Resistive Index.  Normal cortical thickness of the left kidney. Cyst 1.62 cm x  1.71 cm. Mesenteric: Normal Celiac artery and Superior Mesenteric artery findings. Patent IVC. A-fib/atrial flutter. DCCV x 2 2022 briefly successful, return to afib July 2022. Shortly after last cardioversion patient required hospital admission for volume overload and recurrent A-fib/flutter with hypertensive urgency.  She was diuresed and underwent successful cardioversion. Currently on Eliquis 5 mg twice a day, amiodarone 100 mg daily, carvedilol.  Pulmonary hypertension Moderately elevated right heart filling and pulmonary artery pressures with significant pulmonary vascular resistance 01/25/2021. Hypertension. Hyperlipidemia. Last lipid panel 07/12/2021: LDL 43, HDL 70, triglycerides 59, total cholesterol 127. T2DM.  She was last seen in the office on 07/18/2021 by Dr. Rockey Rowland.  At that visit she was maintaining normal sinus rhythm on amiodarone and carvedilol.  Most recently, she was seen in the emergency room on 04/11/2022 for A-fib with RVR.  She was cardioverted twice in the ER and briefly maintained sinus rhythm before going back  into rate controlled A-fib/flutter.  She was seen in the ER by Dr. Rockey Rowland and and her carvedilol was increased to 6.25 mg twice a day.  Mild elevated troponin likely due to to supply demand mismatch given A-fib with RVR.  Chest tightness resolved with rate control.  Mild exacerbation of chronic diastolic CHF due to A-fib with RVR.  IV Lasix improved symptoms.  Patient declined increase of amiodarone given prior adverse effects on the higher dose with tremor, hair loss, and malaise.  She was referred for EP evaluation.   Today, patient is accompanied by her husband.  She reports she has been doing well since being seen in the emergency department.  She does report some mild sluggishness but is able to do her daily walking routine of approximately 5000 steps.  She checks her heart rate with a pulse ox at home and has not noticed rates higher than 90 bpm.  She does state she has an "awareness" on the left side of her chest that is not described as pain and is typically present when she feels her heart rate is a little fast.  She reports no episodes of extremely fast heart rate or symptoms similar to what brought her to the ER on 04/11/2022.   She voices some  concerns that the increased dose of carvedilol is driving up her sugar.  Discussed that carvedilol typically brings sugar down.  Question changes in her diet that could be causing her sugar to be higher than normal.  She manages her diabetes with sliding scale insulin.  She otherwise has no complaints with the increased dose of carvedilol.  Home Medications    Current Meds  Medication Sig   acetaminophen (TYLENOL) 650 MG CR tablet Take 1,300 mg by mouth at bedtime.   ALFALFA PO Take 1 tablet by mouth daily.   amiodarone (PACERONE) 200 MG tablet Take 0.5 tablets (100 mg total) by mouth daily.   ascorbic Acid (VITAMIN C) 500 MG CPCR Take 500 mg by mouth daily.   B Complex-C (B-COMPLEX WITH VITAMIN C) tablet Take 1 tablet by mouth 2 (two) times daily.    BD PEN NEEDLE NANO U/F 32G X 4 MM MISC USE 4 TIMES DAILY (HUMALOG 3 TIMES DAILY AND LANTUS ONCE DAILY) OFFICE NOTIFIED 08/06/17   BLACK ELDERBERRY,BERRY-FLOWER, PO Take 15 mLs by mouth daily. Liquid   Calcium Carbonate (CALCIUM 600 PO) Take 600 mg by mouth daily.   carvedilol (COREG) 6.25 MG tablet Take 1 tablet (6.25 mg total) by mouth 2 (two) times daily with a meal.   cholecalciferol (VITAMIN D) 1000 UNITS tablet Take 1,000 Units by mouth daily. [place]   clobetasol ointment (TEMOVATE) 2.95 % APPLY 1 APPLICATION TOPICALLY 2 TIMES DAILY AS NEEDED (BUG BITES).   clotrimazole (GYNE-LOTRIMIN) 1 % vaginal cream Place 1 Applicatorful vaginally at bedtime.   Coenzyme Q10 (COQ10) 200 MG CAPS Take 200 mg by mouth daily.   Continuous Blood Gluc Sensor ([place]) MISC by Does not apply route.   Elastic Bandages & Supports (MEDICAL COMPRESSION STOCKINGS) MISC 1 each by Does not apply route daily.   ELIQUIS 5 MG TABS tablet TAKE 1 TABLET BY MOUTH TWICE A DAY   estradiol (ESTRACE VAGINAL) 0.1 MG/GM vaginal cream 1/2 gm once weekly using applicator, apply blueberry sized amount of cream using tip of finger to urethra twice weekly   Exenatide ER (BYDUREON BCISE) 2 MG/0.85ML AUIJ Inject 2 mg into the skin every Sunday.   FARXIGA 10 MG TABS tablet Take 10 mg by mouth daily.   furosemide (LASIX) 40 MG tablet Take 80 mg by mouth.   Glucosamine 500 MG TABS Take 500 mg by mouth daily.   HUMALOG KWIKPEN 100 UNIT/ML KiwkPen Inject 2-6 Units into the skin 3 (three) times daily before meals. If needed sliding scale   insulin glargine (LANTUS) 100 unit/mL SOPN Inject 14 Units into the skin at bedtime.   Krill Oil 1000 MG CAPS Take 1,000 mg by mouth daily.   Mag Aspart-Potassium Aspart (POTASSIUM & MAGNESIUM ASPARTAT PO) Take 1 tablet by mouth daily.   Multiple Vitamins-Minerals (PRESERVISION AREDS 2) CAPS Take 1 capsule by mouth 2 (two) times daily.   OVER THE COUNTER MEDICATION Place 1 application  into both eyes See admin instructions. Blephadex eyelid foam, apply to eyelids once daily   OVER THE COUNTER MEDICATION Take 1 tablet by mouth daily. Vita-Lea Gold otc supplement With out vitamin K ([place] products)   OVER THE COUNTER MEDICATION Take 1 tablet by mouth See admin instructions. Nutriferon otc supplement take Take 1 tablet on Sun, Tues, Thurs, Sat. Take 1 tablet twice daily on Mon, Wed, and Fri   Probiotic Product (PROBIOTIC PO) Take 1 capsule by mouth at bedtime.   PROLIA 60 MG/ML SOSY injection Inject 60  mg into the skin every 6 (six) months.   Propylene Glycol (SYSTANE BALANCE OP) Place 1 drop into both eyes daily as needed (dry eyes).   rosuvastatin (CRESTOR) 10 MG tablet TAKE 1 TABLET BY MOUTH EVERY DAY   sacubitril-valsartan (ENTRESTO) 49-51 MG Take 1 tablet by mouth 2 (two) times daily.   Sodium Chloride-Sodium Bicarb (NETI POT SINUS WASH NA) Place 1 Dose into the nose at bedtime.   tiZANidine (ZANAFLEX) 2 MG tablet TAKE 1-2 TABLETS (2-4 MG TOTAL) BY MOUTH AT BEDTIME.   Vitamin D, Ergocalciferol, (DRISDOL) 1.25 MG (50000 UT) CAPS capsule Take 50,000 Units by mouth every Saturday.    Family History    Family History  Problem Relation Age of Onset   Diabetes Mother    Hypertension Mother    Cancer Mother    Kidney disease Mother    Hypertension Father    Cancer Father        Prostate   Breast cancer Maternal Aunt 77   Colon cancer Son    Kidney cancer Neg Hx    Bladder Cancer Neg Hx    She indicated that her mother is deceased. She indicated that her father is deceased. She indicated that her son is alive. She indicated that the status of her maternal aunt is unknown. She indicated that the status of her neg hx is unknown.   Social History    Social History   Socioeconomic History   Marital status: Married    Spouse name: Emergency planning/management officer   Number of children: 1   Years of education: Not on file   Highest education level: Master's degree (e.g., MA, MS, MEng, MEd,  MSW, MBA)  Occupational History   Occupation: Retired  Tobacco Use   Smoking status: Never   Smokeless tobacco: Never   Tobacco comments:    Smoking cessation materials not required  Vaping Use   Vaping Use: Never used  Substance and Sexual Activity   Alcohol use: Not Currently    Alcohol/week: 1.0 standard drink of alcohol    Types: 1 Glasses of wine per week    Comment: RARELY   Drug use: No   Sexual activity: Yes    Partners: Male    Birth control/protection: Post-menopausal  Other Topics Concern   Not on file  Social History Narrative   She is married , only has one son and he was diagnosed with colon cancer at age 41.    Social Determinants of Health   Financial Resource Strain: Low Risk  (04/26/2022)   Overall Financial Resource Strain (CARDIA)    Difficulty of Paying Living Expenses: Not hard at all  Food Insecurity: No Food Insecurity (04/26/2022)   Hunger Vital Sign    Worried About Running Out of Food in the Last Year: Never true    Ran Out of Food in the Last Year: Never true  Transportation Needs: No Transportation Needs (04/26/2022)   PRAPARE - Hydrologist (Medical): No    Lack of Transportation (Non-Medical): No  Physical Activity: Sufficiently Active (04/26/2022)   Exercise Vital Sign    Days of Exercise per Week: 7 days    Minutes of Exercise per Session: 30 min  Stress: No Stress Concern Present (04/26/2022)   [place]    Feeling of Stress : Not at all  Social Connections: Socially Integrated (04/26/2022)   Social Connection and Isolation Panel [NHANES]    Frequency of Communication  with Friends and Family: More than three times a week    Frequency of Social Gatherings with Friends and Family: More than three times a week    Attends Religious Services: More than 4 times per year    Active Member of [employer] or Organizations: Yes    Attends Programme researcher, broadcasting/film/video: More than 4 times per year    Marital Status: Married  Human resources officer Violence: Not At Risk (04/26/2022)   Humiliation, Afraid, Rape, and Kick questionnaire    Fear of Current or Ex-Partner: No    Emotionally Abused: No    Physically Abused: No    Sexually Abused: No     Review of Systems    General: No chills, fever, night sweats or weight changes.  Cardiovascular:  No chest pain, dyspnea on exertion, edema, orthopnea,  paroxysmal nocturnal dyspnea.  Questionable mild palpitations. Dermatological: No rash, lesions/masses Respiratory: No cough, dyspnea Urologic: No hematuria, dysuria Abdominal:   No nausea, vomiting, diarrhea, bright red blood per rectum, melena, or hematemesis Neurologic:  No visual changes, weakness, changes in mental status. All other systems reviewed and are otherwise negative except as noted above.  Physical Exam    VS:  BP 100/60 (BP Location: Left Arm, Patient Position: Sitting, Cuff Size: Large)   Pulse 75   Ht '5\' 3"'$  (1.6 m)   Wt 173 lb (78.5 kg)   SpO2 98%   BMI 30.65 kg/m  , BMI Body mass index is 30.65 kg/m. GEN: Well nourished, well developed, in no acute distress. HEENT: Normal. Neck: Supple, no JVD, carotid bruits, or masses. Cardiac: Irregular rate and rhythm, no murmurs, rubs, or gallops. No clubbing, cyanosis, edema.  Radials/DP/PT 2+ and equal bilaterally.  Respiratory:  Respirations regular and unlabored, clear to auscultation bilaterally. GI: Soft, nontender, nondistended. MS: No deformity or atrophy. Skin: Warm and dry, no rash. Neuro: Strength and sensation are intact. Psych: Normal affect.  Accessory Clinical Findings     Recent Labs: 04/11/2022: ALT 21; B Natriuretic Peptide 347.5; BUN 28; Creatinine, Ser 1.03; Hemoglobin 13.8; Magnesium 2.4; Platelets 199; Potassium 4.4; Sodium 139 04/12/2022: TSH 1.422   Recent Lipid Panel    Component Value Date/Time   CHOL 127 07/12/2021 0844   CHOL 110  01/12/2016 0817   TRIG 59 07/12/2021 0844   HDL 70 07/12/2021 0844   HDL 59 01/12/2016 0817   CHOLHDL 1.8 07/12/2021 0844   LDLCALC 43 07/12/2021 0844        ECG personally reviewed by me today atrial flutter, rate 75.  Unchanged from 04/12/2022 3:29am  CHA2DS2-VASc Score = 7  The patient's score is based upon: CHF History: 1 HTN History: 1 Diabetes History: 1 Stroke History: 0 Vascular Disease History: 1 Age Score: 2 Gender Score: 1   This patients CHA2DS2-VASc Score and unadjusted Ischemic Stroke Rate (% per year) is equal to 11.2 % stroke rate/year from a score of 7  Above score calculated as 1 point each if present [CHF, HTN, DM, Vascular=MI/PAD/Aortic Plaque, Age if 65-74, or Female] Above score calculated as 2 points each if present [Age > 75, or Stroke/TIA/TE]   Assessment & Plan   A-fib/flutter.  Unsuccessful cardioversion x2 in the emergency room 04/11/2022, however she is now rate controlled. EKG today shows a-flutter with a rate of 75.  Since being out of the hospital she has noticed when her heart rate has increased but checks with her home pulse ox heart rate has not been greater than in the 90s.  She reports feeling a little sluggish but is able to do her daily walking routine.  Continue Eliquis 5 mg twice a day, amiodarone 100 mg daily, carvedilol 6.25 mg twice a day.  She is scheduled to see Dr. Quentin Ore on 05/23/2022. Chronic combined systolic and diastolic CHF.  Patient denies shortness of breath or lower extremity edema.  She is able to walk approximately 5000 daily.  She will continue furosemide 40 mg twice a day, Entresto 49-51 mg twice a day, and Farxiga 20 mg daily.  She did develop a yeast infection from recent course of antibiotics but prior to that she has not had an issue with yeast since starting [place].  She will follow-up with heart failure clinic on 05/01/2022. CAD. Mildly elevated troponin in the emergency room likely due to supply demand mismatch given  A-fib with RVR; chest tightness resolved with rate control.  Patient denies chest pain at rest or with exertion.  Patient is tolerating increased dose of carvedilol. Hypertension.  BP is a little soft today at 100/60. Hyperlipidemia.  Last lipid panel 07/12/2021.  LDL at goal at 43.  Continue Crestor 10 mg daily. T2DM.  She feels her sugars are little higher than normal.  She feels this might be due to the carvedilol increase.  Discussed with patient that carvedilol typically bring sugars down.  She will continue to monitor and adjust her sliding scale insulin.   Disposition: No changes to medications today.  Patient will keep follow-up with heart failure clinic on 05/01/2022 and EP visit on 05/23/2022.  Return sooner as needed.   Justice [place]. Librado Guandique, NP-C     04/27/2022, 12:08 PM [facility] [address] Office [phone] Fax [phone]   I spent 10 minutes examining this patient, reviewing medications, and using patient centered shared decision making involving her cardiac care.  Prior to her visit I spent greater than 20 minutes reviewing her past medical history,  medications, and prior cardiac tests.

## 2022-04-26 NOTE — Progress Notes (Signed)
I connected with  Bonnie Rowland on 04/26/22 by a audio enabled telemedicine application and verified that I am speaking with the correct person using two identifiers.  Patient Location: Home  Provider Location: Office/Clinic  I discussed the limitations of evaluation and management by telemedicine. The patient expressed understanding and agreed to proceed.   Subjective:   Bonnie Rowland is a 81 y.o. female who presents for Medicare Annual (Subsequent) preventive examination.  Review of Systems    Per HPI unless specifically indicated below.        Objective:        04/12/2022    2:34 PM 04/12/2022   11:00 AM 04/12/2022   10:30 AM  Vitals with BMI  Systolic 729 021 115  Diastolic 55 61 77  Pulse 77 90 79    Today's Vitals   04/26/22 1403  PainSc: 2    There is no height or weight on file to calculate BMI.     04/20/2021    9:30 AM 01/24/2021    1:00 AM 05/13/2020   11:48 AM 04/19/2020    9:29 AM 04/17/2019    8:33 AM 04/11/2018    9:33 AM 04/08/2017    2:43 PM  Advanced Directives  Does Patient Have a Medical Advance Directive? Yes Yes Yes Yes Yes Yes Yes  Type of Paramedic of [place];Living will [place];Living will [place];Living will [place];Living will [place];Living will [place];Living will [place];Living will  Does patient want to make changes to medical advance directive?  No - Patient declined    No - Patient declined   Copy of [place] in Chart? Yes - validated most recent copy scanned in chart (See row information) No - copy requested No - copy requested Yes - validated most recent copy scanned in chart (See row information) Yes - validated most recent copy scanned in chart (See row information) Yes Yes    Current Medications (verified) Outpatient Encounter Medications as of  04/26/2022  Medication Sig   acetaminophen (TYLENOL) 650 MG CR tablet Take 1,300 mg by mouth at bedtime.   ALFALFA PO Take 1 tablet by mouth daily.   amiodarone (PACERONE) 200 MG tablet Take 0.5 tablets (100 mg total) by mouth daily.   amoxicillin-clavulanate (AUGMENTIN) 875-125 MG tablet Take 1 tablet by mouth 2 (two) times daily.   ascorbic Acid (VITAMIN C) 500 MG CPCR Take 500 mg by mouth daily.   B Complex-C (B-COMPLEX WITH VITAMIN C) tablet Take 1 tablet by mouth 2 (two) times daily.   BD PEN NEEDLE NANO U/F 32G X 4 MM MISC USE 4 TIMES DAILY (HUMALOG 3 TIMES DAILY AND LANTUS ONCE DAILY) OFFICE NOTIFIED 08/06/17   BLACK ELDERBERRY,BERRY-FLOWER, PO Take 15 mLs by mouth daily. Liquid   Calcium Carbonate (CALCIUM 600 PO) Take 600 mg by mouth daily.   carvedilol (COREG) 6.25 MG tablet Take 1 tablet (6.25 mg total) by mouth 2 (two) times daily with a meal.   cholecalciferol (VITAMIN D) 1000 UNITS tablet Take 1,000 Units by mouth daily. Shaklee   clobetasol ointment (TEMOVATE) 5.20 % APPLY 1 APPLICATION TOPICALLY 2 TIMES DAILY AS NEEDED (BUG BITES).   clotrimazole (GYNE-LOTRIMIN) 1 % vaginal cream Place 1 Applicatorful vaginally at bedtime.   Coenzyme Q10 (COQ10) 200 MG CAPS Take 200 mg by mouth daily.   Continuous Blood Gluc Sensor ([place]) MISC  by Does not apply route.   Elastic Bandages & Supports (MEDICAL COMPRESSION STOCKINGS) MISC 1 each by Does not apply route daily.   ELIQUIS 5 MG TABS tablet TAKE 1 TABLET BY MOUTH TWICE A DAY   estradiol (ESTRACE VAGINAL) 0.1 MG/GM vaginal cream 1/2 gm once weekly using applicator, apply blueberry sized amount of cream using tip of finger to urethra twice weekly   Exenatide ER (BYDUREON BCISE) 2 MG/0.85ML AUIJ Inject 2 mg into the skin every Sunday.   FARXIGA 10 MG TABS tablet Take 10 mg by mouth daily.   furosemide (LASIX) 40 MG tablet TAKE 1 TABLET BY MOUTH TWICE A DAY (Patient taking differently: Take 80 mg by mouth daily.)    Glucosamine 500 MG TABS Take 500 mg by mouth daily.   HUMALOG KWIKPEN 100 UNIT/ML KiwkPen Inject 2-6 Units into the skin 3 (three) times daily before meals. If needed sliding scale   insulin glargine (LANTUS) 100 unit/mL SOPN Inject 14 Units into the skin at bedtime.   Krill Oil 1000 MG CAPS Take 1,000 mg by mouth daily.   Mag Aspart-Potassium Aspart (POTASSIUM & MAGNESIUM ASPARTAT PO) Take 1 tablet by mouth daily.   Multiple Vitamins-Minerals (PRESERVISION AREDS 2) CAPS Take 1 capsule by mouth 2 (two) times daily.   mupirocin ointment (BACTROBAN) 2 % Apply 1 Application topically 2 (two) times daily.   OVER THE COUNTER MEDICATION Place 1 application into both eyes See admin instructions. Blephadex eyelid foam, apply to eyelids once daily   OVER THE COUNTER MEDICATION Take 1 tablet by mouth daily. Vita-Lea Gold otc supplement With out vitamin K (Shaklee products)   OVER THE COUNTER MEDICATION Take 1 tablet by mouth See admin instructions. Nutriferon otc supplement take Take 1 tablet on Sun, Tues, Thurs, Sat. Take 1 tablet twice daily on Mon, Wed, and Fri   Probiotic Product (PROBIOTIC PO) Take 1 capsule by mouth at bedtime.   PROLIA 60 MG/ML SOSY injection Inject 60 mg into the skin every 6 (six) months.   Propylene Glycol (SYSTANE BALANCE OP) Place 1 drop into both eyes daily as needed (dry eyes).   rosuvastatin (CRESTOR) 10 MG tablet TAKE 1 TABLET BY MOUTH EVERY DAY   sacubitril-valsartan (ENTRESTO) 49-51 MG Take 1 tablet by mouth 2 (two) times daily.   Sodium Chloride-Sodium Bicarb (NETI POT SINUS WASH NA) Place 1 Dose into the nose at bedtime.   tiZANidine (ZANAFLEX) 2 MG tablet TAKE 1-2 TABLETS (2-4 MG TOTAL) BY MOUTH AT BEDTIME.   Vitamin D, Ergocalciferol, (DRISDOL) 1.25 MG (50000 UT) CAPS capsule Take 50,000 Units by mouth every Saturday.   [DISCONTINUED] doxycycline (VIBRA-TABS) 100 MG tablet Take 1 tablet (100 mg total) by mouth 2 (two) times daily.   No facility-administered encounter  medications on file as of 04/26/2022.    Allergies (verified) Cyclobenzaprine, Cheese, Ciprofloxacin hcl, Coconut (cocos nucifera), and Keflex [cephalexin]   History: Past Medical History:  Diagnosis Date   Acute cystitis    Acute on chronic combined systolic and diastolic CHF (congestive heart failure) (HCC)    Acute respiratory failure with hypoxia (HCC) 01/23/2021   AF (paroxysmal atrial fibrillation) ([place]) 01/23/2021   Allergic rhinitis, cause unspecified    Arthritis 2015   Atherosclerosis of renal artery (HCC)    left   Atrial fibrillation (HCC)    Bronchitis, not specified as acute or chronic    Cancer ([place]) 12/2013   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; left shoulder blade   Cancer ([place]) 05/2014   basal cell removed  left temple   Cataract 2003   Cellulitis and abscess of leg, except foot    Conjunctivitis unspecified    Dermatophytosis of nail    Diabetes mellitus    type II   Elevated troponin    Esophageal reflux    Hyperlipidemia    Hypertension    Osteoporosis 2016   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)    Renal artery stenosis (HCC)    Sprain of lumbar region    Thoracic or lumbosacral neuritis or radiculitis, unspecified    Urinary tract infection, site not specified    Past Surgical History:  Procedure Laterality Date   APPENDECTOMY     CARDIAC CATHETERIZATION     CARDIOVERSION N/A 11/30/2020   Procedure: CARDIOVERSION;  Surgeon: Minna Merritts, MD;  Location: [facility];  Service: Cardiovascular;  Laterality: N/A;   CARDIOVERSION N/A 01/20/2021   Procedure: CARDIOVERSION;  Surgeon: Minna Merritts, MD;  Location: [facility];  Service: Cardiovascular;  Laterality: N/A;   CARDIOVERSION N/A 01/30/2021   Procedure: CARDIOVERSION;  Surgeon: Wellington Hampshire, MD;  Location: [facility];  Service: Cardiovascular;  Laterality: N/A;   cataract surgery     COLONOSCOPY     COLONOSCOPY WITH PROPOFOL N/A 05/09/2016   Procedure: COLONOSCOPY WITH PROPOFOL;  Surgeon: Robert Bellow, MD;   Location: [facility];  Service: Endoscopy;  Laterality: N/A;   DIAGNOSTIC MAMMOGRAM     EYE SURGERY Bilateral    Cataract Extraction   JOINT REPLACEMENT  2016   KNEE ARTHROPLASTY Right 03/30/2015   Procedure: COMPUTER ASSISTED TOTAL KNEE ARTHROPLASTY;  Surgeon: Dereck Leep, MD;  Location: [facility];  Service: Orthopedics;  Laterality: Right;   KNEE ARTHROSCOPY Right    KNEE CLOSED REDUCTION Right 05/23/2015   Procedure: CLOSED MANIPULATION KNEE;  Surgeon: Dereck Leep, MD;  Location: [facility];  Service: Orthopedics;  Laterality: Right;   RIGHT/LEFT HEART CATH AND CORONARY ANGIOGRAPHY N/A 01/25/2021   Procedure: RIGHT/LEFT HEART CATH AND CORONARY ANGIOGRAPHY;  Surgeon: Nelva Bush, MD;  Location: [facility];  Service: Cardiovascular;  Laterality: N/A;   TONSILLECTOMY     Family History  Problem Relation Age of Onset   Diabetes Mother    Hypertension Mother    Cancer Mother    Kidney disease Mother    Hypertension Father    Cancer Father        Prostate   Breast cancer Maternal Aunt 58   Colon cancer Son    Kidney cancer Neg Hx    Bladder Cancer Neg Hx    Social History   Socioeconomic History   Marital status: Married    Spouse name: Emergency planning/management officer   Number of children: 1   Years of education: Not on file   Highest education level: Master's degree (e.g., MA, MS, MEng, MEd, MSW, MBA)  Occupational History   Occupation: Retired  Tobacco Use   Smoking status: Never   Smokeless tobacco: Never   Tobacco comments:    Smoking cessation materials not required  Vaping Use   Vaping Use: Never used  Substance and Sexual Activity   Alcohol use: Not Currently    Alcohol/week: 1.0 standard drink of alcohol    Types: 1 Glasses of wine per week    Comment: RARELY   Drug use: No   Sexual activity: Yes    Partners: Male    Birth control/protection: Post-menopausal  Other Topics Concern   Not on file  Social History Narrative   She is married , only  has one son and  he was diagnosed with colon cancer at age 73.    Social Determinants of Health   Financial Resource Strain: Low Risk  (04/26/2022)   Overall Financial Resource Strain (CARDIA)    Difficulty of Paying Living Expenses: Not hard at all  Food Insecurity: No Food Insecurity (04/26/2022)   Hunger Vital Sign    Worried About Running Out of Food in the Last Year: Never true    Ran Out of Food in the Last Year: Never true  Transportation Needs: No Transportation Needs (04/26/2022)   PRAPARE - Hydrologist (Medical): No    Lack of Transportation (Non-Medical): No  Physical Activity: Sufficiently Active (04/26/2022)   Exercise Vital Sign    Days of Exercise per Week: 7 days    Minutes of Exercise per Session: 30 min  Stress: No Stress Concern Present (04/26/2022)   [place]    Feeling of Stress : Not at all  Social Connections: [place] (04/26/2022)   Social Connection and Isolation Panel [NHANES]    Frequency of Communication with Friends and Family: More than three times a week    Frequency of Social Gatherings with Friends and Family: More than three times a week    Attends Religious Services: More than 4 times per year    Active Member of [employer] or Organizations: Yes    Attends Music therapist: More than 4 times per year    Marital Status: Married    Tobacco Counseling Counseling given: No Tobacco comments: Smoking cessation materials not required   Clinical Intake:  Pre-visit preparation completed: No  Pain : 0-10 Pain Score: 2  Pain Type: Chronic pain Pain Location: Back Pain Orientation: Right Pain Descriptors / Indicators: Aching Pain Onset: More than a month ago Pain Frequency: Intermittent     Nutritional Status: BMI of 19-24  Normal Nutritional Risks: None Diabetes: Yes CBG done?: Yes CBG resulted in Enter/ Edit results?: Yes Did pt. bring in  CBG monitor from home?: Yes Glucose Meter Downloaded?: Yes  How often do you need to have someone help you when you read instructions, pamphlets, or other written materials from your doctor or pharmacy?: 1 - Never  Diabetic?Nutrition Risk Assessment:  Has the patient had any N/V/D within the last 2 months?  No  Does the patient have any non-healing wounds?  No  Has the patient had any unintentional weight loss or weight gain?  No   Diabetes:  Is the patient diabetic?  Yes  If diabetic, was a CBG obtained today?  Yes  Did the patient bring in their glucometer from home?  Yes  How often do you monitor your CBG's? Continuous .   Financial Strains and Diabetes Management:  Are you having any financial strains with the device, your supplies or your medication? No .  Does the patient want to be seen by Chronic Care Management for management of their diabetes?  No  Would the patient like to be referred to a Nutritionist or for Diabetic Management?  No   Diabetic Exams:  Diabetic Eye Exam: Completed 01/31/2022 Diabetic Foot Exam: Completed 07/12/2021       Information entered by :: Donnie Mesa, [place]   Activities of Daily Living    04/26/2022    2:00 PM 02/12/2022    9:11 AM  In your present state of health, do you have any difficulty performing the following activities:  Hearing? 1 1  Vision? 1 1  Difficulty concentrating or making decisions? 0 0  Walking or climbing stairs? 0 0  Dressing or bathing? 0 0  Doing errands, shopping? 0 0    Patient Care Team: Steele Sizer, MD as PCP - General (Family Medicine) Minna Merritts, MD as PCP - Cardiology (Cardiology) Minna Merritts, MD as Consulting Physician (Cardiology) Dasher, Rayvon Char, MD as Consulting Physician (Dermatology) Estill Cotta, MD as Consulting Physician (Ophthalmology) Lonia Farber, MD as Consulting Physician (Internal Medicine) Beverly Gust, MD as Consulting Physician  (Otolaryngology) Iris Pert, [place] as Referring Physician (Chiropractic Medicine) Germaine Pomfret, [facility] as Pharmacist (Pharmacist)  Indicate any recent Medical Services you may have received from other than [facility] providers in the past year (date may be approximate). The pt was seen at [place] [facility] on 04/11/22 for Atrial Fib.     Assessment:   This is a routine wellness examination for Marya.  Hearing/Vision screen Wear hearing aids. Denies any changes with her vision. Wear glasses, Annual Eye Exam   Dietary issues and exercise activities discussed: Current Exercise Habits: Structured exercise class;Home exercise routine, Type of exercise: walking;strength training/weights;stretching, Time (Minutes): 25, Frequency (Times/Week): 7, Weekly Exercise (Minutes/Week): 175, Intensity: Moderate   Goals Addressed   None   Depression Screen    04/26/2022    1:59 PM 02/12/2022    9:11 AM 01/09/2022    8:02 AM 08/29/2021    9:46 AM 07/12/2021    7:56 AM 05/09/2021    9:44 AM 04/20/2021    9:30 AM  PHQ 2/9 Scores  PHQ - 2 Score 0 0 0 0 0 0 0  PHQ- 9 Score  0 0  0      Fall Risk    04/26/2022    1:59 PM 02/12/2022    9:11 AM 01/09/2022    8:02 AM 12/27/2021    9:52 AM 09/26/2021   10:03 AM  [place] in the past year? 0 0 0 0 0  Number falls in past yr: 0 0 0 0 0  Injury with Fall? 0 0 0 0 0  Risk for fall due to : No Fall Risks No Fall Risks No Fall Risks    Follow up Falls evaluation completed Falls prevention discussed Falls prevention discussed Falls evaluation completed Falls evaluation completed    FALL RISK PREVENTION PERTAINING TO THE HOME:  Any stairs in or around the home? No  If so, are there any without handrails? No  Home free of loose throw rugs in walkways, pet beds, electrical cords, etc? Yes  Adequate lighting in your home to reduce risk of falls? Yes   ASSISTIVE DEVICES UTILIZED TO PREVENT FALLS:  Life alert? Yes  Use of a cane,  walker or w/c? Yes  Grab bars in the bathroom? Yes  Shower chair or bench in shower? Yes  Elevated toilet seat or a handicapped toilet? Yes   TIMED UP AND GO:  Was the test performed?No  Cognitive Function:        04/26/2022    2:01 PM 04/11/2018    9:38 AM 04/08/2017    2:57 PM  6CIT Screen  What Year? 0 points 0 points 0 points  What month? 0 points 0 points 0 points  What time? 0 points 0 points 0 points  Count back from 20 0 points 0 points 0 points  Months in reverse 0 points 0 points 0 points  Repeat  phrase 0 points 0 points 0 points  Total Score 0 points 0 points 0 points    Immunizations Immunization History  Administered Date(s) Administered   Fluad Quad(high Dose 65+) 03/19/2019, 04/20/2020   Influenza, High Dose Seasonal PF 03/10/2015, 03/02/2016, 03/12/2017, 04/07/2018, 04/13/2021, 04/17/2022   Moderna Sars-Covid-2 Vaccination 07/14/2019, 08/11/2019, 05/17/2020, 11/17/2020, 03/17/2021   Pneumococcal Conjugate-13 07/13/2013   Pneumococcal Polysaccharide-23 03/31/2010   Tdap 03/07/2012, 01/19/2022   Zoster Recombinat (Shingrix) 02/02/2017, 04/05/2017   Zoster, Live 06/01/2010    TDAP status: Up to date  Flu Vaccine status: Up to date  Pneumococcal vaccine status: Up to date  Covid-19 vaccine status: Information provided on how to obtain vaccines.   Qualifies for Shingles Vaccine? Yes   Zostavax completed Yes   Shingrix Completed?: Yes  Screening Tests Health Maintenance  Topic Date Due   COVID-19 Vaccine (6 - Moderna risk series) 05/12/2021   FOOT EXAM  07/12/2022   HEMOGLOBIN A1C  10/12/2022   MAMMOGRAM  11/17/2022   Diabetic kidney evaluation - Urine ACR  01/10/2023   OPHTHALMOLOGY EXAM  02/01/2023   Diabetic kidney evaluation - GFR measurement  04/12/2023   Medicare Annual Wellness (AWV)  05/27/2023   DEXA SCAN  09/22/2023   TETANUS/TDAP  01/20/2032   Pneumonia Vaccine 25+ Years old  Completed   INFLUENZA VACCINE  Completed   Hepatitis C  Screening  Completed   Zoster Vaccines- Shingrix  Completed   HPV VACCINES  Aged Out    Health Maintenance  Health Maintenance Due  Topic Date Due   COVID-19 Vaccine (6 - Moderna risk series) 05/12/2021    Colorectal cancer screening: No longer required.   Mammogram status: Completed 11/16/2021. Repeat every year  DEXA Scan: completed 09/21/2021  Lung Cancer Screening: (Low Dose CT Chest recommended if Age 44-80 years, 30 pack-year currently smoking OR have quit w/in 15years.) does not qualify.     Additional Screening:  Hepatitis C Screening: does qualify; Completed 01/07/2020  Vision Screening: Recommended annual ophthalmology exams for early detection of glaucoma and other disorders of the eye. Is the patient up to date with their annual eye exam?  Yes  Who is the provider or what is the name of the office in which the patient attends annual eye exams? [facility]  If pt is not established with a provider, would they like to be referred to a provider to establish care? No .   Dental Screening: Recommended annual dental exams for proper oral hygiene  Community Resource Referral / Chronic Care Management: CRR required this visit?  No   CCM required this visit?  No      Plan:     I have personally reviewed and noted the following in the patient's chart:   Medical and social history Use of alcohol, tobacco or illicit drugs  Current medications and supplements including opioid prescriptions. Patient is not currently taking opioid prescriptions. Functional ability and status Nutritional status Physical activity Advanced directives List of other physicians Hospitalizations, surgeries, and ER visits in previous 12 months Vitals Screenings to include cognitive, depression, and falls Referrals and appointments  In addition, I have reviewed and discussed with patient certain preventive protocols, quality metrics, and best practice recommendations. A written  personalized care plan for preventive services as well as general preventive health recommendations were provided to patient.    Ms. Bambach , Thank you for taking time to come for your Medicare Wellness Visit. I appreciate your ongoing commitment to your health goals. Please  review the following plan we discussed and let me know if I can assist you in the future.   These are the goals we discussed:  Goals      Increase physical activity     Patient would like to maintain physical activity with tai chi and dance class twice weekly and walking daily. 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      Monitor and Manage My Blood Sugar-Diabetes Type 2     Timeframe:  Long-Range Goal Priority:  High Start Date: 02/09/2021                             Expected End Date:  02/10/2023                     Follow Up within 90 days    - check blood sugar at prescribed times - check blood sugar before and after exercise - check blood sugar if I feel it is too high or too low - take the blood sugar meter to all doctor visits    Why is this important?   Checking your blood sugar at home helps to keep it from getting very high or very low.  Writing the results in a diary or log helps the doctor know how to care for you.  Your blood sugar log should have the time, date and the results.  Also, write down the amount of insulin or other medicine that you take.  Other information, like what you ate, exercise done and how you were feeling, will also be helpful.     Notes:      Reduce sugar intake to X grams per day     Recommend to eliminate carbs and sweets from diet     Weight (lb) < 200 lb (90.7 kg)     Goal is to loose 10 pounds by next year.  Wt Readings from Last 3 Encounters:  04/11/18 204 lb 1.6 oz (92.6 kg)  01/06/18 205 lb 1.6 oz (93 kg)  12/16/17 206 lb 6.4 oz (93.6 kg)           This is a list of the screening recommended for you and due dates:  Health Maintenance   Topic Date Due   COVID-19 Vaccine (6 - Moderna risk series) 05/12/2021   Complete foot exam   07/12/2022   Hemoglobin A1C  10/12/2022   Mammogram  11/17/2022   Yearly kidney health urinalysis for diabetes  01/10/2023   Eye exam for diabetics  02/01/2023   Yearly kidney function blood test for diabetes  04/12/2023   Medicare Annual Wellness Visit  05/27/2023   DEXA scan (bone density measurement)  09/22/2023   Tetanus Vaccine  01/20/2032   Pneumonia Vaccine  Completed   Flu Shot  Completed   Hepatitis C Screening: USPSTF Recommendation to screen - Ages 18-79 yo.  Completed   Zoster (Shingles) Vaccine  Completed   HPV Vaccine  Aged [address] [place]   04/26/2022   Nurse Notes: Approximately 30 minute Non-Face -To-Face Visit

## 2022-04-26 NOTE — Patient Instructions (Signed)

## 2022-04-27 ENCOUNTER — Ambulatory Visit: Payer: Medicare Other | Attending: Nurse Practitioner | Admitting: Student

## 2022-04-27 ENCOUNTER — Encounter: Payer: Self-pay | Admitting: Nurse Practitioner

## 2022-04-27 VITALS — BP 100/60 | HR 75 | Ht 63.0 in | Wt 173.0 lb

## 2022-04-27 DIAGNOSIS — I480 Paroxysmal atrial fibrillation: Secondary | ICD-10-CM | POA: Diagnosis not present

## 2022-04-27 DIAGNOSIS — I4892 Unspecified atrial flutter: Secondary | ICD-10-CM | POA: Diagnosis not present

## 2022-04-27 DIAGNOSIS — I10 Essential (primary) hypertension: Secondary | ICD-10-CM | POA: Insufficient documentation

## 2022-04-27 DIAGNOSIS — E782 Mixed hyperlipidemia: Secondary | ICD-10-CM | POA: Insufficient documentation

## 2022-04-27 DIAGNOSIS — I701 Atherosclerosis of renal artery: Secondary | ICD-10-CM | POA: Diagnosis not present

## 2022-04-27 DIAGNOSIS — I2510 Atherosclerotic heart disease of native coronary artery without angina pectoris: Secondary | ICD-10-CM | POA: Diagnosis not present

## 2022-04-27 DIAGNOSIS — E785 Hyperlipidemia, unspecified: Secondary | ICD-10-CM | POA: Insufficient documentation

## 2022-04-27 DIAGNOSIS — E1169 Type 2 diabetes mellitus with other specified complication: Secondary | ICD-10-CM | POA: Diagnosis not present

## 2022-04-27 DIAGNOSIS — I5042 Chronic combined systolic (congestive) and diastolic (congestive) heart failure: Secondary | ICD-10-CM | POA: Insufficient documentation

## 2022-04-27 NOTE — Patient Instructions (Addendum)
Medication Instructions:   Your physician recommends that you continue on your current medications as directed. Please refer to the Current Medication list given to you today.  *If you need a refill on your cardiac medications before your next appointment, please call your pharmacy*   Lab Work:  None Ordered  If you have labs (blood work) drawn today and your tests are completely normal, you will receive your results only by: [place] (if you have MyChart) OR A paper copy in the mail If you have any lab test that is abnormal or we need to change your treatment, we will call you to review the results.   Testing/Procedures:  None Ordered   Follow-Up: At [facility], you and your health needs are our priority.  As part of our continuing mission to provide you with exceptional heart care, we have created designated Provider Care Teams.  These Care Teams include your primary Cardiologist (physician) and Advanced Practice Providers (APPs -  Physician Assistants and Nurse Practitioners) who all work together to provide you with the care you need, when you need it.  We recommend signing up for the patient portal called "MyChart".  Sign up information is provided on this After Visit Summary.  MyChart is used to connect with patients for Virtual Visits (Telemedicine).  Patients are able to view lab/test results, encounter notes, upcoming appointments, etc.  Non-urgent messages can be sent to your provider as well.   To learn more about what you can do with MyChart, go to [email or web address].    Your next appointment:  As scheduled   - 05/01/2022 11:00 AM (30 min)  FOLLOW UP 20 [facility] Alisa Graff, FNP  - 05/23/2022 2:20 PM (20 min) NEW PATIENT Rfl Status: Authorized [facility] Vickie Epley, MD

## 2022-04-30 ENCOUNTER — Ambulatory Visit: Payer: Medicare Other | Admitting: Family

## 2022-05-01 ENCOUNTER — Ambulatory Visit: Payer: Medicare Other | Attending: Family | Admitting: Family

## 2022-05-01 ENCOUNTER — Encounter: Payer: Self-pay | Admitting: Family

## 2022-05-01 VITALS — BP 124/47 | HR 62 | Resp 20 | Ht 63.0 in | Wt 174.0 lb

## 2022-05-01 DIAGNOSIS — I701 Atherosclerosis of renal artery: Secondary | ICD-10-CM | POA: Diagnosis not present

## 2022-05-01 DIAGNOSIS — I5032 Chronic diastolic (congestive) heart failure: Secondary | ICD-10-CM | POA: Diagnosis not present

## 2022-05-01 DIAGNOSIS — I480 Paroxysmal atrial fibrillation: Secondary | ICD-10-CM | POA: Diagnosis not present

## 2022-05-01 DIAGNOSIS — E1169 Type 2 diabetes mellitus with other specified complication: Secondary | ICD-10-CM

## 2022-05-01 DIAGNOSIS — I110 Hypertensive heart disease with heart failure: Secondary | ICD-10-CM | POA: Insufficient documentation

## 2022-05-01 DIAGNOSIS — Z8249 Family history of ischemic heart disease and other diseases of the circulatory system: Secondary | ICD-10-CM | POA: Insufficient documentation

## 2022-05-01 DIAGNOSIS — K219 Gastro-esophageal reflux disease without esophagitis: Secondary | ICD-10-CM | POA: Insufficient documentation

## 2022-05-01 DIAGNOSIS — Z79899 Other long term (current) drug therapy: Secondary | ICD-10-CM | POA: Insufficient documentation

## 2022-05-01 DIAGNOSIS — E119 Type 2 diabetes mellitus without complications: Secondary | ICD-10-CM | POA: Insufficient documentation

## 2022-05-01 DIAGNOSIS — I10 Essential (primary) hypertension: Secondary | ICD-10-CM

## 2022-05-01 DIAGNOSIS — Z833 Family history of diabetes mellitus: Secondary | ICD-10-CM | POA: Diagnosis not present

## 2022-05-01 DIAGNOSIS — Z794 Long term (current) use of insulin: Secondary | ICD-10-CM | POA: Diagnosis not present

## 2022-05-01 DIAGNOSIS — E785 Hyperlipidemia, unspecified: Secondary | ICD-10-CM | POA: Diagnosis not present

## 2022-05-01 NOTE — Patient Instructions (Signed)
Continue weighing daily and call for an overnight weight gain of 3 pounds or more or a weekly weight gain of more than 5 pounds.   If you have voicemail, please make sure your mailbox is cleaned out so that we may leave a message and please make sure to listen to any voicemails.     

## 2022-05-01 NOTE — Progress Notes (Unsigned)
Patient ID: Bonnie Rowland, female    DOB: 09-Sep-1940, 81 y.o.   MRN: [id number]  Bonnie Rowland is a 81 y/o female with a history of HTN, DM, hyperlipidemia, atrial fibrillation, GERD and chronic heart failure.   Echo report from 03/31/21 reviewed and showed an EF of  60-65% without structural changes. Echo report from 01/23/21 reviewed and showed an EF of 45-50% with mild LVH, severe hypokinesis of left ventricle and trivial MR.   RHC/LHC done 01/25/21 and showed: Mild to moderate, non-obstructive coronary artery disease, including 30% mid LAD disease, 50-60% mid/distal LCx stenosis, and 60% proximal/mid RCA lesion. Mildly to moderately reduced left ventricular systolic function with mid anterior hypo/akinesis; query atypical Takotsubo variant.  LVEF ~45%. Mildly elevated left heart filling pressures. Moderately elevated right heart filling and pulmonary artery pressures with significant pulmonary vascular resistance. Mildly reduced Fick cardiac output/index.  Was in the ED 04/11/22 due to tachycardia due to AF. Cardioversion done X2. Cardiology consult obtained. Given IV metoprolol and then transitioned or oral carvedilol. Discharged the following day.   She presents today for a follow-up visit with a chief complaint of minimal fatigue upon moderate exertion. Describes this as chronic in nature. She has associated pedal edema and right shoulder pain along with this. She denies any difficulty sleeping, abdominal distention, palpitations, chest pain, shortness of breath, cough, dizziness or weight gain.   She was recently cardioverted twice in the ED but could not be converted to NSR. Has upcoming EP evaluation.   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; left shoulder blade   Cancer ([place]) 05/2014   basal cell removed left temple   Cataract 2003   Cellulitis and abscess of leg, except foot    Conjunctivitis unspecified    Dermatophytosis of nail    Diabetes mellitus    type II   Elevated troponin    Esophageal reflux    Hyperlipidemia    Hypertension    Osteoporosis 2016   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)    Renal artery stenosis (HCC)    Sprain of lumbar region    Thoracic or lumbosacral neuritis or radiculitis, unspecified    Urinary tract infection, site not specified    Past Surgical History:  Procedure Laterality Date   APPENDECTOMY     CARDIAC CATHETERIZATION     CARDIOVERSION N/A 11/30/2020   Procedure: CARDIOVERSION;  Surgeon: Minna Merritts, MD;  Location: [facility];  Service: Cardiovascular;  Laterality: N/A;   CARDIOVERSION N/A 01/20/2021   Procedure: CARDIOVERSION;  Surgeon: Minna Merritts, MD;  Location: [facility];  Service: Cardiovascular;  Laterality: N/A;   CARDIOVERSION N/A 01/30/2021   Procedure: CARDIOVERSION;  Surgeon: Wellington Hampshire, MD;  Location: [facility];  Service: Cardiovascular;  Laterality: N/A;   cataract surgery     COLONOSCOPY     COLONOSCOPY WITH PROPOFOL N/A 05/09/2016   Procedure: COLONOSCOPY WITH PROPOFOL;  Surgeon: Robert Bellow, MD;  Location: [facility];  Service: Endoscopy;  Laterality: N/A;   DIAGNOSTIC  MAMMOGRAM     EYE SURGERY Bilateral    Cataract Extraction   JOINT REPLACEMENT  2016   KNEE ARTHROPLASTY Right 03/30/2015   Procedure: COMPUTER ASSISTED TOTAL KNEE ARTHROPLASTY;  Surgeon: Dereck Leep, MD;  Location: [facility];  Service: Orthopedics;  Laterality: Right;   KNEE ARTHROSCOPY Right    KNEE CLOSED REDUCTION Right 05/23/2015   Procedure: CLOSED MANIPULATION KNEE;   Surgeon: Dereck Leep, MD;  Location: [facility];  Service: Orthopedics;  Laterality: Right;   RIGHT/LEFT HEART CATH AND CORONARY ANGIOGRAPHY N/A 01/25/2021   Procedure: RIGHT/LEFT HEART CATH AND CORONARY ANGIOGRAPHY;  Surgeon: Nelva Bush, MD;  Location: [facility];  Service: Cardiovascular;  Laterality: N/A;   TONSILLECTOMY     Family History  Problem Relation Age of Onset   Diabetes Mother    Hypertension Mother    Cancer Mother    Kidney disease Mother    Hypertension Father    Cancer Father        Prostate   Breast cancer Maternal Aunt 22   Colon cancer Son    Kidney cancer Neg Hx    Bladder Cancer Neg Hx    Social History   Tobacco Use   Smoking status: Never   Smokeless tobacco: Never   Tobacco comments:    Smoking cessation materials not required  Substance Use Topics   Alcohol use: Not Currently    Alcohol/week: 1.0 standard drink of alcohol    Types: 1 Glasses of wine per week    Comment: RARELY   Allergies  Allergen Reactions   Cyclobenzaprine Hypertension   Cheese Other (See Comments)    bloating   Ciprofloxacin Hcl Other (See Comments)    Muscle pain   Coconut (Cocos Nucifera)     Upset stomach   Keflex [Cephalexin] Other (See Comments)    Patient prefers not to take this medication due to the side effects, caused tendon issues   Prior to Admission medications   Medication Sig Start Date End Date Taking? Authorizing Provider  acetaminophen (TYLENOL) 650 MG CR tablet Take 1,300 mg by mouth at bedtime.   Yes [provider]  ALFALFA PO Take 1 tablet by mouth daily.   Yes [provider]  amiodarone (PACERONE) 200 MG tablet Take 0.5 tablets (100 mg total) by mouth daily. 04/12/21  Yes Dunn, Areta Haber, [name]  ascorbic Acid (VITAMIN C) 500 MG CPCR Take 500 mg by mouth daily.   Yes [provider]  B Complex-C (B-COMPLEX WITH VITAMIN C) tablet Take 1 tablet by mouth 2 (two) times daily.   Yes [provider]  BD  PEN NEEDLE NANO U/F 32G X 4 MM MISC USE 4 TIMES DAILY (HUMALOG 3 TIMES DAILY AND LANTUS ONCE DAILY) OFFICE NOTIFIED 08/06/17 12/07/18  Yes Sowles, Drue Stager, MD  BLACK ELDERBERRY,BERRY-FLOWER, PO Take 15 mLs by mouth daily. Liquid   Yes [provider]  Calcium Carbonate (CALCIUM 600 PO) Take 600 mg by mouth daily.   Yes [provider]  carvedilol (COREG) 6.25 MG tablet Take 1 tablet (6.25 mg total) by mouth 2 (two) times daily with a meal. 04/12/22 07/11/22 Yes Agbata, Tochukwu, MD  clotrimazole (GYNE-LOTRIMIN) 1 % vaginal cream Place 1 Applicatorful vaginally at bedtime. 04/25/22  Yes Sowles, Drue Stager, MD  Coenzyme Q10 (COQ10) 200 MG CAPS Take 200 mg by mouth daily.   Yes [provider]  Continuous Blood Gluc Sensor ([place]) MISC by Does not apply route.  Yes [provider]  Elastic Bandages & Supports (MEDICAL COMPRESSION STOCKINGS) [place] 1 each by Does not apply route daily. 01/08/19  Yes Sowles, Drue Stager, MD  ELIQUIS 5 MG TABS tablet TAKE 1 TABLET BY MOUTH TWICE A DAY 03/13/22  Yes Gollan, Kathlene November, MD  estradiol (ESTRACE VAGINAL) 0.1 MG/GM vaginal cream 1/2 gm once weekly using applicator, apply blueberry sized amount of cream using tip of finger to urethra twice weekly 07/06/21  Yes McGowan, Larene Beach A, [name]  Exenatide ER (BYDUREON BCISE) 2 MG/0.85ML AUIJ Inject 2 mg into the skin every Sunday. 02/26/20  Yes [provider]  FARXIGA 10 MG TABS tablet Take 10 mg by mouth daily. 03/30/22  Yes [provider]  furosemide (LASIX) 40 MG tablet Take 80 mg by mouth.   Yes [provider]  Glucosamine 500 MG TABS Take 500 mg by mouth daily.   Yes [provider]  HUMALOG KWIKPEN 100 UNIT/ML KiwkPen Inject 2-6 Units into the skin 3 (three) times daily before meals. If needed sliding scale 10/15/17  Yes [provider]  insulin glargine (LANTUS) 100 unit/mL SOPN Inject 14 Units into the skin at bedtime.   Yes  [provider]  Javier Docker Oil 1000 MG CAPS Take 1,000 mg by mouth daily.   Yes [provider]  Mag Aspart-Potassium Aspart (POTASSIUM & MAGNESIUM ASPARTAT PO) Take 1 tablet by mouth daily.   Yes [provider]  Multiple Vitamins-Minerals (PRESERVISION AREDS 2) CAPS Take 1 capsule by mouth 2 (two) times daily.   Yes [provider]  OVER THE COUNTER MEDICATION Place 1 application into both eyes See admin instructions. Blephadex eyelid foam, apply to eyelids once daily   Yes [provider]  OVER THE COUNTER MEDICATION Take 1 tablet by mouth daily. Vita-Lea Gold otc supplement With out vitamin K (Shaklee products)   Yes [provider]  OVER THE COUNTER MEDICATION Take 1 tablet by mouth See admin instructions. Nutriferon otc supplement take Take 1 tablet on Sun, Tues, Thurs, Sat. Take 1 tablet twice daily on Mon, Wed, and Fri   Yes [provider]  Probiotic Product (PROBIOTIC PO) Take 1 capsule by mouth at bedtime.   Yes [provider]  PROLIA 60 MG/ML SOSY injection Inject 60 mg into the skin every 6 (six) months. 09/02/19  Yes [provider]  Propylene Glycol (SYSTANE BALANCE OP) Place 1 drop into both eyes daily as needed (dry eyes).   Yes [provider]  rosuvastatin (CRESTOR) 10 MG tablet TAKE 1 TABLET BY MOUTH EVERY DAY 03/18/22  Yes Sowles, Drue Stager, MD  sacubitril-valsartan (ENTRESTO) 49-51 MG Take 1 tablet by mouth 2 (two) times daily. 10/11/21  Yes Paulino Cork, Otila Kluver A, FNP  Sodium Chloride-Sodium Bicarb (NETI POT SINUS WASH NA) Place 1 Dose into the nose at bedtime.   Yes [provider]  tiZANidine (ZANAFLEX) 2 MG tablet TAKE 1-2 TABLETS (2-4 MG TOTAL) BY MOUTH AT BEDTIME. 04/16/22  Yes Sowles, Drue Stager, MD  Vitamin D, Ergocalciferol, (DRISDOL) 1.25 MG (50000 UT) CAPS capsule Take 50,000 Units by mouth every Saturday. 11/23/18  Yes [provider]  cholecalciferol (VITAMIN D) 1000 UNITS tablet  Take 1,000 Units by mouth daily. Shaklee    [provider]  clobetasol ointment (TEMOVATE) 5.36 % APPLY 1 APPLICATION TOPICALLY 2 TIMES DAILY AS NEEDED (BUG BITES). 02/12/22   Steele Sizer, MD  doxycycline (VIBRA-TABS) 100 MG tablet Take 1 tablet (100 mg total) by mouth 2 (two) times daily. 01/16/22  [name], DPM   Review of Systems  Constitutional:  Positive for fatigue. Negative for appetite change.  HENT:  Negative for congestion, postnasal drip and sore throat.   Eyes: Negative.   Respiratory:  Negative for cough, chest tightness and shortness of breath.   Cardiovascular:  Positive for leg swelling. Negative for chest pain and palpitations.  Gastrointestinal:  Negative for abdominal distention and abdominal pain.  Endocrine: Negative.   Genitourinary: Negative.   Musculoskeletal:  Positive for arthralgias (right shoulder). Negative for back pain.  Skin: Negative.   Allergic/Immunologic: Negative.   Neurological:  Negative for dizziness and light-headedness.  Hematological:  Negative for adenopathy. Does not bruise/bleed easily.  Psychiatric/Behavioral:  Negative for dysphoric mood and sleep disturbance (sleeping in recliner). The patient is not nervous/anxious.    Vitals:   05/01/22 1103  BP: (!) 124/47  Pulse: 62  Resp: 20  SpO2: 100%  Weight: 174 lb (78.9 kg)  Height: '5\' 3"'$  (1.6 m)   Wt Readings from Last 3 Encounters:  05/01/22 174 lb (78.9 kg)  04/27/22 173 lb (78.5 kg)  04/11/22 180 lb 12.4 oz (82 kg)   Lab Results  Component Value Date   CREATININE 1.03 (H) 04/11/2022   CREATININE 0.95 01/09/2022   CREATININE 0.85 07/12/2021   Physical Exam Vitals and nursing note reviewed.  Constitutional:      General: She is not in acute distress.    Appearance: Normal appearance.  HENT:     Head: Normocephalic and atraumatic.  Cardiovascular:     Rate and Rhythm: Normal rate. Rhythm irregular.  Pulmonary:     Effort: Pulmonary effort is normal. No  respiratory distress.     Breath sounds: No wheezing or rales.  Abdominal:     General: There is no distension.     Palpations: Abdomen is soft.  Musculoskeletal:        General: No tenderness.     Cervical back: Normal range of motion and neck supple.     Right lower leg: Edema (1+ pitting) present.     Left lower leg: Edema (trace) present.  Skin:    General: Skin is warm and dry.  Neurological:     General: No focal deficit present.     Mental Status: She is alert and oriented to person, place, and time.  Psychiatric:        Mood and Affect: Mood normal.        Behavior: Behavior normal.        Thought Content: Thought content normal.    Assessment & Plan:  1: Chronic heart failure with preserved ejection fraction- - NYHA class II - euvolemic today - weighing daily; reminded to call for an overnight weight gain of > 2 pounds or a weekly weight gain of > 5 pounds.  - weigh down 3 pounds from her last visit here 4 months ago - is trying to walk more and walking between 3000-6000 steps daily - not adding salt and has been reading food labels - GDMT of entresto & farxiga - wearing compression socks daily and removes HS - BNP 04/11/22 was 347.5 - has gotten flu vaccine for this season  2: HTN- - BP  looks good (124/47) - saw PCP Ancil Boozer) 02/12/22 - BMP 04/11/22 reviewed and showed sodium 139, potassium 4.4, creatinine 1.03  & GFR 55  3: DM- - A1c 04/12/22 was 6.4% - saw endocrinology Honor Junes) 03/29/22 - nonfasting glucose at home this morning was 146  4: Paroxysmal atrial  fibrillation- - unsuccessfully cardioverted twice 04/11/22 - saw cardiology ([place]) 04/27/22 - due to see EP Quentin Ore) 05/23/22 & is hoping to discuss ablation   Medication bottles reviewed.   Return in 6 months, sooner if needed.

## 2022-05-02 ENCOUNTER — Encounter: Payer: Self-pay | Admitting: Family

## 2022-05-02 DIAGNOSIS — M7521 Bicipital tendinitis, right shoulder: Secondary | ICD-10-CM | POA: Diagnosis not present

## 2022-05-02 DIAGNOSIS — M7551 Bursitis of right shoulder: Secondary | ICD-10-CM | POA: Diagnosis not present

## 2022-05-02 DIAGNOSIS — M7541 Impingement syndrome of right shoulder: Secondary | ICD-10-CM | POA: Diagnosis not present

## 2022-05-02 DIAGNOSIS — M25511 Pain in right shoulder: Secondary | ICD-10-CM | POA: Diagnosis not present

## 2022-05-02 DIAGNOSIS — M6281 Muscle weakness (generalized): Secondary | ICD-10-CM | POA: Diagnosis not present

## 2022-05-02 DIAGNOSIS — M778 Other enthesopathies, not elsewhere classified: Secondary | ICD-10-CM | POA: Diagnosis not present

## 2022-05-02 DIAGNOSIS — M19011 Primary osteoarthritis, right shoulder: Secondary | ICD-10-CM | POA: Diagnosis not present

## 2022-05-02 DIAGNOSIS — G8929 Other chronic pain: Secondary | ICD-10-CM | POA: Diagnosis not present

## 2022-05-04 DIAGNOSIS — M7541 Impingement syndrome of right shoulder: Secondary | ICD-10-CM | POA: Diagnosis not present

## 2022-05-04 DIAGNOSIS — M778 Other enthesopathies, not elsewhere classified: Secondary | ICD-10-CM | POA: Diagnosis not present

## 2022-05-04 DIAGNOSIS — M25511 Pain in right shoulder: Secondary | ICD-10-CM | POA: Diagnosis not present

## 2022-05-04 DIAGNOSIS — M19011 Primary osteoarthritis, right shoulder: Secondary | ICD-10-CM | POA: Diagnosis not present

## 2022-05-04 DIAGNOSIS — M6281 Muscle weakness (generalized): Secondary | ICD-10-CM | POA: Diagnosis not present

## 2022-05-04 DIAGNOSIS — G8929 Other chronic pain: Secondary | ICD-10-CM | POA: Diagnosis not present

## 2022-05-07 DIAGNOSIS — M5416 Radiculopathy, lumbar region: Secondary | ICD-10-CM | POA: Diagnosis not present

## 2022-05-07 DIAGNOSIS — M5033 Other cervical disc degeneration, cervicothoracic region: Secondary | ICD-10-CM | POA: Diagnosis not present

## 2022-05-07 DIAGNOSIS — M9903 Segmental and somatic dysfunction of lumbar region: Secondary | ICD-10-CM | POA: Diagnosis not present

## 2022-05-07 DIAGNOSIS — M9901 Segmental and somatic dysfunction of cervical region: Secondary | ICD-10-CM | POA: Diagnosis not present

## 2022-05-08 ENCOUNTER — Telehealth: Payer: Self-pay

## 2022-05-08 NOTE — Progress Notes (Signed)
Chronic Care Management Pharmacy Assistant   Name: Bonnie Rowland  MRN: [id number] DOB: 01/06/1941  Reason for Encounter: Diabetes Disease State Call   Recent office visits:  05/01/2022 [name], [place] ([facility]) for CHF- No medication changes noted, No order placed, Patent to follow-up in 6 months,   04/26/2022 Artis Delay, CMA (Clinical Support for PCP) for Medicare Wellness Exam- No medication changes noted, No orders placed.   04/19/2022 [name], FNP (Family Medicine) for URI- Started: Amoxicillin-Pot 875-125 mg daily, No orders placed,   Recent consult visits:  04/27/2022 Mayra Reel, NP (Cardiology) for [place]: Furosemide 40 mg, Amoxicillin, Mupirocin Started: Furosemide 80 mg,    04/19/2022 [name], DPM (Podiatry) for Nail Problem- No medication changes noted, No orders placed, Patient to follow-up in 3 months  04/11/2022 [name], DO (PT) for Pain-No medication changes noted, No orders placed, Patient to follow-up as needed  09/282023 Riesa Pope, MD (Endocrinology) for Follow-up- Changed: Lantus decreased to 14 units, Started: Farxiga 10 mg tablet once daily, Follow-up in 3-4 months  Hospital visits:  Medication Reconciliation was completed by comparing discharge summary, patient's EMR and Pharmacy list, and upon discussion with patient.  Admitted to the hospital on 04/11/2022 due to Tachycardia. Discharge date was 04/12/2022. Discharged from [facility] Emergency Department.    New?Medications Started at [facility] Discharge:?? -Started None ID  Medication Changes at Hospital Discharge: -Changed  carvedilol 6.25 MG tablet Take 1 tablet (6.25 mg total) by mouth 2 (two) times daily with a meal. What changed: medication strength how much to take when to take this  Medications Discontinued at Hospital Discharge: -Stopped Fluconazole 150 mg Miconazole  Nitrate  Medications that remain the same after Hospital Discharge:??  -All other medications will remain the same.    Medications: Outpatient Encounter Medications as of 05/08/2022  Medication Sig   acetaminophen (TYLENOL) 650 MG CR tablet Take 1,300 mg by mouth at bedtime.   ALFALFA PO Take 1 tablet by mouth daily.   amiodarone (PACERONE) 200 MG tablet Take 0.5 tablets (100 mg total) by mouth daily.   ascorbic Acid (VITAMIN C) 500 MG CPCR Take 500 mg by mouth daily.   B Complex-C (B-COMPLEX WITH VITAMIN C) tablet Take 1 tablet by mouth 2 (two) times daily.   BD PEN NEEDLE NANO U/F 32G X 4 MM MISC USE 4 TIMES DAILY (HUMALOG 3 TIMES DAILY AND LANTUS ONCE DAILY) OFFICE NOTIFIED 08/06/17   BLACK ELDERBERRY,BERRY-FLOWER, PO Take 15 mLs by mouth daily. Liquid   Calcium Carbonate (CALCIUM 600 PO) Take 600 mg by mouth daily.   carvedilol (COREG) 6.25 MG tablet Take 1 tablet (6.25 mg total) by mouth 2 (two) times daily with a meal.   cholecalciferol (VITAMIN D) 1000 UNITS tablet Take 1,000 Units by mouth daily. Shaklee   clobetasol ointment (TEMOVATE) 1.49 % APPLY 1 APPLICATION TOPICALLY 2 TIMES DAILY AS NEEDED (BUG BITES).   clotrimazole (GYNE-LOTRIMIN) 1 % vaginal cream Place 1 Applicatorful vaginally at bedtime.   Coenzyme Q10 (COQ10) 200 MG CAPS Take 200 mg by mouth daily.   Continuous Blood Gluc Sensor ([place]) MISC by Does not apply route.   Elastic Bandages & Supports (MEDICAL COMPRESSION STOCKINGS) MISC 1 each by Does not apply route daily.   ELIQUIS 5 MG TABS tablet TAKE 1 TABLET BY MOUTH TWICE A DAY   estradiol (ESTRACE VAGINAL) 0.1 MG/GM vaginal cream 1/2 gm once weekly using applicator, apply blueberry sized amount  of cream using tip of finger to urethra twice weekly   Exenatide ER (BYDUREON BCISE) 2 MG/0.85ML AUIJ Inject 2 mg into the skin every Sunday.   FARXIGA 10 MG TABS tablet Take 10 mg by mouth daily.   furosemide (LASIX) 40 MG tablet Take 80 mg by mouth.    Glucosamine 500 MG TABS Take 500 mg by mouth daily.   HUMALOG KWIKPEN 100 UNIT/ML KiwkPen Inject 2-6 Units into the skin 3 (three) times daily before meals. If needed sliding scale   insulin glargine (LANTUS) 100 unit/mL SOPN Inject 14 Units into the skin at bedtime.   Krill Oil 1000 MG CAPS Take 1,000 mg by mouth daily.   Mag Aspart-Potassium Aspart (POTASSIUM & MAGNESIUM ASPARTAT PO) Take 1 tablet by mouth daily.   Multiple Vitamins-Minerals (PRESERVISION AREDS 2) CAPS Take 1 capsule by mouth 2 (two) times daily.   OVER THE COUNTER MEDICATION Place 1 application into both eyes See admin instructions. Blephadex eyelid foam, apply to eyelids once daily   OVER THE COUNTER MEDICATION Take 1 tablet by mouth daily. Vita-Lea Gold otc supplement With out vitamin K (Shaklee products)   OVER THE COUNTER MEDICATION Take 1 tablet by mouth See admin instructions. Nutriferon otc supplement take Take 1 tablet on Sun, Tues, Thurs, Sat. Take 1 tablet twice daily on Mon, Wed, and Fri   Probiotic Product (PROBIOTIC PO) Take 1 capsule by mouth at bedtime.   PROLIA 60 MG/ML SOSY injection Inject 60 mg into the skin every 6 (six) months.   Propylene Glycol (SYSTANE BALANCE OP) Place 1 drop into both eyes daily as needed (dry eyes).   rosuvastatin (CRESTOR) 10 MG tablet TAKE 1 TABLET BY MOUTH EVERY DAY   sacubitril-valsartan (ENTRESTO) 49-51 MG Take 1 tablet by mouth 2 (two) times daily.   Sodium Chloride-Sodium Bicarb (NETI POT SINUS WASH NA) Place 1 Dose into the nose at bedtime.   tiZANidine (ZANAFLEX) 2 MG tablet TAKE 1-2 TABLETS (2-4 MG TOTAL) BY MOUTH AT BEDTIME.   Vitamin D, Ergocalciferol, (DRISDOL) 1.25 MG (50000 UT) CAPS capsule Take 50,000 Units by mouth every Saturday.   [DISCONTINUED] doxycycline (VIBRA-TABS) 100 MG tablet Take 1 tablet (100 mg total) by mouth 2 (two) times daily.   No facility-administered encounter medications on file as of 05/08/2022.   Care Gaps: None ID  Star Rating  Drugs: Farxiga 10 mg last filled on 04/26/2022 for a 90-Day supply with [facility] Rosuvastatin 10 mg last filled on 03/23/2022 for a 90-Day supply with [facility]  Recent Relevant Labs: Lab Results  Component Value Date/Time   HGBA1C 6.4 (H) 04/12/2022 01:01 PM   HGBA1C 6.7 09/21/2021 12:00 AM   HGBA1C 7.1 03/23/2021 12:00 AM   MICROALBUR <0.2 01/09/2022 08:49 AM   MICROALBUR Negative 08/21/2019 12:00 AM   MICROALBUR 0 02/18/2017 09:01 AM   MICROALBUR 50 04/23/2016 10:37 AM    Kidney Function Lab Results  Component Value Date/Time   CREATININE 1.03 (H) 04/11/2022 11:47 PM   CREATININE 0.95 01/09/2022 08:49 AM   CREATININE 0.85 07/12/2021 08:44 AM   GFRNONAA 55 (L) 04/11/2022 11:47 PM   GFRNONAA 88 04/11/2018 10:32 AM   GFRAA 102 04/11/2018 10:32 AM   Current antihyperglycemic regimen:  Farxiga 10 mg 1 tablet daily Lantus 100 units Inject 14 units at bedtime Humalog 100 unit Inject 2-6 units 3 times daily before a meal  What recent interventions/DTPs have been made to improve glycemic control:  Patient was recently started on Faxiga 10 mg tablets  daily, Lantus decreased to 14 units daily.  Have there been any recent hospitalizations or ED visits since last visit with CPP? Yes  Patient denies hypoglycemic symptoms, including Pale, Sweaty, Shaky, Hungry, Nervous/irritable, and Vision changes Patient denies hyperglycemic symptoms, including blurry vision, excessive thirst, fatigue, polyuria, and weakness How often are you checking your blood sugar? once daily  What are your blood sugars ranging?  Fasting: last few weeks patient states she has been averaging around 125  During the week, how often does your blood glucose drop below 70?  Patient stated she was having a few lows but her Endo decreased her Lantus to 14 units.  Are you checking your feet daily/regularly?  Yes  Adherence Review: Is the patient currently on a STATIN medication? Yes Is the patient currently on  ACE/ARB medication? Yes Does the patient have >5 day gap between last estimated fill dates? No  Patient reports that she is doing okay. Patient was in the ER a few weeks ago and stated that she is going to have a consultation for an Ablation. Patient reports that she feels like she is doing well on the [place]. She has requested a follow-up with CPP right before Christmas and I was able to get her scheduled for 06/13/2022 @ 1000.   Patient has no immediate concerns or issues at this time. Patient encouraged to give me a call if she needs anything prior to her appointment with CPP.  Patient has a follow-up appointment with CPP on 10/03/2022 @ 1545.  Lynann Bologna, CPA/CMA Clinical Pharmacist Assistant Phone: [phone]

## 2022-05-09 DIAGNOSIS — G8929 Other chronic pain: Secondary | ICD-10-CM | POA: Diagnosis not present

## 2022-05-09 DIAGNOSIS — M778 Other enthesopathies, not elsewhere classified: Secondary | ICD-10-CM | POA: Diagnosis not present

## 2022-05-09 DIAGNOSIS — M7541 Impingement syndrome of right shoulder: Secondary | ICD-10-CM | POA: Diagnosis not present

## 2022-05-09 DIAGNOSIS — M19011 Primary osteoarthritis, right shoulder: Secondary | ICD-10-CM | POA: Diagnosis not present

## 2022-05-09 DIAGNOSIS — M6281 Muscle weakness (generalized): Secondary | ICD-10-CM | POA: Diagnosis not present

## 2022-05-09 DIAGNOSIS — M25511 Pain in right shoulder: Secondary | ICD-10-CM | POA: Diagnosis not present

## 2022-05-11 DIAGNOSIS — M25511 Pain in right shoulder: Secondary | ICD-10-CM | POA: Diagnosis not present

## 2022-05-11 DIAGNOSIS — M778 Other enthesopathies, not elsewhere classified: Secondary | ICD-10-CM | POA: Diagnosis not present

## 2022-05-11 DIAGNOSIS — Z23 Encounter for immunization: Secondary | ICD-10-CM | POA: Diagnosis not present

## 2022-05-11 DIAGNOSIS — M6281 Muscle weakness (generalized): Secondary | ICD-10-CM | POA: Diagnosis not present

## 2022-05-11 DIAGNOSIS — M19011 Primary osteoarthritis, right shoulder: Secondary | ICD-10-CM | POA: Diagnosis not present

## 2022-05-11 DIAGNOSIS — M7541 Impingement syndrome of right shoulder: Secondary | ICD-10-CM | POA: Diagnosis not present

## 2022-05-11 DIAGNOSIS — G8929 Other chronic pain: Secondary | ICD-10-CM | POA: Diagnosis not present

## 2022-05-16 DIAGNOSIS — M25511 Pain in right shoulder: Secondary | ICD-10-CM | POA: Diagnosis not present

## 2022-05-16 DIAGNOSIS — M19011 Primary osteoarthritis, right shoulder: Secondary | ICD-10-CM | POA: Diagnosis not present

## 2022-05-16 DIAGNOSIS — M7541 Impingement syndrome of right shoulder: Secondary | ICD-10-CM | POA: Diagnosis not present

## 2022-05-16 DIAGNOSIS — M6281 Muscle weakness (generalized): Secondary | ICD-10-CM | POA: Diagnosis not present

## 2022-05-16 DIAGNOSIS — G8929 Other chronic pain: Secondary | ICD-10-CM | POA: Diagnosis not present

## 2022-05-16 DIAGNOSIS — M778 Other enthesopathies, not elsewhere classified: Secondary | ICD-10-CM | POA: Diagnosis not present

## 2022-05-18 DIAGNOSIS — M25511 Pain in right shoulder: Secondary | ICD-10-CM | POA: Diagnosis not present

## 2022-05-18 DIAGNOSIS — M6281 Muscle weakness (generalized): Secondary | ICD-10-CM | POA: Diagnosis not present

## 2022-05-18 DIAGNOSIS — M7541 Impingement syndrome of right shoulder: Secondary | ICD-10-CM | POA: Diagnosis not present

## 2022-05-18 DIAGNOSIS — M19011 Primary osteoarthritis, right shoulder: Secondary | ICD-10-CM | POA: Diagnosis not present

## 2022-05-18 DIAGNOSIS — G8929 Other chronic pain: Secondary | ICD-10-CM | POA: Diagnosis not present

## 2022-05-18 DIAGNOSIS — M778 Other enthesopathies, not elsewhere classified: Secondary | ICD-10-CM | POA: Diagnosis not present

## 2022-05-21 DIAGNOSIS — M9901 Segmental and somatic dysfunction of cervical region: Secondary | ICD-10-CM | POA: Diagnosis not present

## 2022-05-21 DIAGNOSIS — M9903 Segmental and somatic dysfunction of lumbar region: Secondary | ICD-10-CM | POA: Diagnosis not present

## 2022-05-21 DIAGNOSIS — M5033 Other cervical disc degeneration, cervicothoracic region: Secondary | ICD-10-CM | POA: Diagnosis not present

## 2022-05-21 DIAGNOSIS — M5416 Radiculopathy, lumbar region: Secondary | ICD-10-CM | POA: Diagnosis not present

## 2022-05-22 DIAGNOSIS — M6281 Muscle weakness (generalized): Secondary | ICD-10-CM | POA: Diagnosis not present

## 2022-05-22 DIAGNOSIS — M778 Other enthesopathies, not elsewhere classified: Secondary | ICD-10-CM | POA: Diagnosis not present

## 2022-05-22 DIAGNOSIS — M7541 Impingement syndrome of right shoulder: Secondary | ICD-10-CM | POA: Diagnosis not present

## 2022-05-22 DIAGNOSIS — G8929 Other chronic pain: Secondary | ICD-10-CM | POA: Diagnosis not present

## 2022-05-22 DIAGNOSIS — M25511 Pain in right shoulder: Secondary | ICD-10-CM | POA: Diagnosis not present

## 2022-05-22 DIAGNOSIS — M19011 Primary osteoarthritis, right shoulder: Secondary | ICD-10-CM | POA: Diagnosis not present

## 2022-05-23 ENCOUNTER — Ambulatory Visit: Payer: Medicare Other | Admitting: Cardiology

## 2022-05-29 DIAGNOSIS — G8929 Other chronic pain: Secondary | ICD-10-CM | POA: Diagnosis not present

## 2022-05-29 DIAGNOSIS — M778 Other enthesopathies, not elsewhere classified: Secondary | ICD-10-CM | POA: Diagnosis not present

## 2022-05-29 DIAGNOSIS — M25511 Pain in right shoulder: Secondary | ICD-10-CM | POA: Diagnosis not present

## 2022-05-29 DIAGNOSIS — M6281 Muscle weakness (generalized): Secondary | ICD-10-CM | POA: Diagnosis not present

## 2022-05-29 DIAGNOSIS — M19011 Primary osteoarthritis, right shoulder: Secondary | ICD-10-CM | POA: Diagnosis not present

## 2022-05-29 DIAGNOSIS — M7541 Impingement syndrome of right shoulder: Secondary | ICD-10-CM | POA: Diagnosis not present

## 2022-06-09 ENCOUNTER — Other Ambulatory Visit: Payer: Self-pay | Admitting: Family Medicine

## 2022-06-09 DIAGNOSIS — M62830 Muscle spasm of back: Secondary | ICD-10-CM

## 2022-06-11 DIAGNOSIS — M5416 Radiculopathy, lumbar region: Secondary | ICD-10-CM | POA: Diagnosis not present

## 2022-06-11 DIAGNOSIS — M5033 Other cervical disc degeneration, cervicothoracic region: Secondary | ICD-10-CM | POA: Diagnosis not present

## 2022-06-11 DIAGNOSIS — M9903 Segmental and somatic dysfunction of lumbar region: Secondary | ICD-10-CM | POA: Diagnosis not present

## 2022-06-11 DIAGNOSIS — M9901 Segmental and somatic dysfunction of cervical region: Secondary | ICD-10-CM | POA: Diagnosis not present

## 2022-06-13 ENCOUNTER — Ambulatory Visit: Payer: Medicare Other | Attending: Cardiology | Admitting: Cardiology

## 2022-06-13 ENCOUNTER — Telehealth: Payer: Medicare Other

## 2022-06-13 ENCOUNTER — Encounter: Payer: Self-pay | Admitting: Cardiology

## 2022-06-13 VITALS — BP 148/68 | HR 55 | Ht 63.0 in | Wt 171.0 lb

## 2022-06-13 DIAGNOSIS — I480 Paroxysmal atrial fibrillation: Secondary | ICD-10-CM | POA: Insufficient documentation

## 2022-06-13 DIAGNOSIS — I5042 Chronic combined systolic (congestive) and diastolic (congestive) heart failure: Secondary | ICD-10-CM | POA: Diagnosis not present

## 2022-06-13 DIAGNOSIS — I4892 Unspecified atrial flutter: Secondary | ICD-10-CM | POA: Insufficient documentation

## 2022-06-13 NOTE — Progress Notes (Signed)
Electrophysiology Office Note:    Date:  06/13/2022   ID:  [name], DOB 09-26-1940, MRN [id number]  PCP:  Steele Sizer, MD  [facility] Cardiologist:  Ida Rogue, MD  [facility] Electrophysiologist:  Vickie Epley, MD   Referring MD: Steele Sizer, MD   Chief Complaint: Atrial fibrillation  History of Present Illness:    Bonnie Rowland is a 81 y.o. female who presents for an evaluation of atrial fibrillation at the request of Dr. Rockey Situ. Their medical history includes chronic diastolic heart failure hypertension, diabetes and hyperlipidemia.  She was hospitalized in October with chest discomfort and palpitations.  She was found to be in atrial fibrillation with rapid ventricular rates.  She was cardioverted in the emergency department back to sinus rhythm but had ERAF.  She was sedated again during that ER visit and another cardioversion attempt was performed but was unsuccessful.  Today she is in clinic with her husband.  She is back in normal rhythm.  She continues to take amiodarone 100 mg by mouth once daily.  She takes her Eliquis religiously.   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; left shoulder blade   Cancer ([place]) 05/2014   basal cell removed left temple   Cataract 2003   Cellulitis and abscess of leg, except foot    Conjunctivitis unspecified    Dermatophytosis of nail    Diabetes mellitus    type II   Elevated troponin    Esophageal reflux    Hyperlipidemia    Hypertension    Osteoporosis  2016   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)    Renal artery stenosis (HCC)    Sprain of lumbar region    Thoracic or lumbosacral neuritis or radiculitis, unspecified    Urinary tract infection, site not specified     Past Surgical History:  Procedure Laterality Date   APPENDECTOMY     CARDIAC CATHETERIZATION     CARDIOVERSION N/A 11/30/2020   Procedure: CARDIOVERSION;  Surgeon: Minna Merritts, MD;  Location: [facility] ORS;  Service: Cardiovascular;  Laterality: N/A;   CARDIOVERSION N/A 01/20/2021   Procedure: CARDIOVERSION;  Surgeon: Minna Merritts, MD;  Location: [place] [facility];  Service: Cardiovascular;  Laterality: N/A;   CARDIOVERSION N/A 01/30/2021   Procedure: CARDIOVERSION;  Surgeon: Wellington Hampshire, MD;  Location: [facility] OR[facility];  Service: Cardiovascular;  Laterality: N/A;   cataract surgery     COLONOSCOPY     COLONOSCOPY WITH PROPOFOL N/A 05/09/2016   Procedure: COLONOSCOPY WITH PROPOFOL;  Surgeon: Robert Bellow, MD;  Location: [facility] ENDOSCOPY;  Service: Endoscopy;  Laterality: N/A;   DIAGNOSTIC MAMMOGRAM     EYE SURGERY Bilateral    Cataract Extraction   JOINT REPLACEMENT  2016   KNEE ARTHROPLASTY Right 03/30/2015   Procedure: COMPUTER ASSISTED TOTAL KNEE ARTHROPLASTY;  Surgeon: Dereck Leep, MD;  Location: [facility] ORS;  Service: Orthopedics;  Laterality: Right;   KNEE ARTHROSCOPY Right    KNEE CLOSED REDUCTION Right 05/23/2015   Procedure: CLOSED MANIPULATION KNEE;  Surgeon: Dereck Leep, MD;  Location: [facility] OR[facility];  Service: Orthopedics;  Laterality: Right;   RIGHT/LEFT HEART CATH AND CORONARY ANGIOGRAPHY N/A 01/25/2021   Procedure: RIGHT/LEFT HEART CATH AND CORONARY ANGIOGRAPHY;  Surgeon: Nelva Bush, MD;  Location: [place] [facility];  Service: Cardiovascular;  Laterality: N/A;   TONSILLECTOMY      Current Medications: Current Meds  Medication Sig   acetaminophen (TYLENOL) 650 MG CR tablet Take 1,300 mg by mouth at bedtime.   ALFALFA PO Take 1 tablet by mouth daily.    amiodarone (PACERONE) 200 MG tablet Take 0.5 tablets (100 mg total) by mouth daily.   ascorbic Acid (VITAMIN C) 500 MG CPCR Take 500 mg by mouth daily.   B Complex-C (B-COMPLEX WITH VITAMIN C) tablet Take 1 tablet by mouth 2 (two) times daily.   BD PEN NEEDLE NANO U/F 32G X 4 MM MISC USE 4 TIMES DAILY (HUMALOG 3 TIMES DAILY AND LANTUS ONCE DAILY) OFFICE NOTIFIED 08/06/17   BLACK ELDERBERRY,BERRY-FLOWER, PO Take 15 mLs by mouth daily. Liquid   Calcium Carbonate (CALCIUM 600 PO) Take 600 mg by mouth daily.   carvedilol (COREG) 6.25 MG tablet Take 1 tablet (6.25 mg total) by mouth 2 (two) times daily with a meal.   cholecalciferol (VITAMIN D) 1000 UNITS tablet Take 1,000 Units by mouth daily. [place]   clobetasol ointment (TEMOVATE) 6.83 % APPLY 1 APPLICATION TOPICALLY 2 TIMES DAILY AS NEEDED (BUG BITES).   clotrimazole (GYNE-LOTRIMIN) 1 % vaginal cream Place 1 Applicatorful vaginally at bedtime.   Coenzyme Q10 (COQ10) 200 MG CAPS Take 200 mg by mouth daily.   Continuous Blood Gluc Sensor ([place]) MISC by Does not apply route.   Elastic Bandages & Supports (MEDICAL COMPRESSION STOCKINGS) MISC 1 each by Does not apply route daily.   ELIQUIS 5 MG TABS tablet TAKE 1 TABLET BY MOUTH TWICE A DAY   estradiol (ESTRACE VAGINAL) 0.1 MG/GM vaginal cream 1/2 gm once weekly using applicator, apply blueberry sized amount of cream using tip of finger to urethra twice weekly   Exenatide ER (BYDUREON BCISE) 2 MG/0.85ML AUIJ Inject 2 mg into the skin every Sunday.   FARXIGA 10 MG TABS tablet Take 10 mg by mouth daily.   furosemide (LASIX) 40 MG tablet Take 80 mg by mouth.   Glucosamine 500 MG TABS Take 500 mg by mouth daily.   HUMALOG KWIKPEN 100 UNIT/ML KiwkPen Inject 2-6 Units into the skin 3 (three) times daily before meals. If needed sliding scale   insulin glargine (LANTUS) 100 unit/mL SOPN Inject 14 Units into the skin at bedtime.   Krill Oil 1000 MG CAPS Take 1,000 mg by mouth daily.    Mag Aspart-Potassium Aspart (POTASSIUM & MAGNESIUM ASPARTAT PO) Take 1 tablet by mouth daily.   Multiple Vitamins-Minerals (PRESERVISION AREDS 2) CAPS Take 1 capsule by mouth 2 (two) times daily.   OVER THE COUNTER MEDICATION Place 1 application into both eyes See admin instructions. Blephadex eyelid foam, apply to eyelids once daily   OVER THE COUNTER MEDICATION Take 1 tablet by mouth daily. Vita-Lea Gold otc supplement With out vitamin K ([place]aklee products)   OVER THE COUNTER MEDICATION Take 1 tablet by mouth See admin instructions. Nutriferon otc supplement take Take 1 tablet on Sun, Tues, Thurs, Sat. Take 1 tablet twice daily on Mon, Wed, and Fri   Probiotic Product (PROBIOTIC PO) Take 1 capsule by mouth at bedtime.   PROLIA 60 MG/ML SOSY injection Inject 60 mg into the  skin every 6 (six) months.   Propylene Glycol (SYSTANE BALANCE OP) Place 1 drop into both eyes daily as needed (dry eyes).   rosuvastatin (CRESTOR) 10 MG tablet TAKE 1 TABLET BY MOUTH EVERY DAY   sacubitril-valsartan (ENTRESTO) 49-51 MG Take 1 tablet by mouth 2 (two) times daily.   Sodium Chloride-Sodium Bicarb (NETI POT SINUS WASH NA) Place 1 Dose into the nose at bedtime.   tiZANidine (ZANAFLEX) 2 MG tablet TAKE 1-2 TABLETS (2-4 MG TOTAL) BY MOUTH AT BEDTIME.   Vitamin D, Ergocalciferol, (DRISDOL) 1.25 MG (50000 UT) CAPS capsule Take 50,000 Units by mouth every Saturday.     Allergies:   Cyclobenzaprine, Cheese, Ciprofloxacin hcl, Coconut (cocos nucifera), and Keflex [cephalexin]   Social History   Socioeconomic History   Marital status: Married    Spouse name: Emergency planning/management officer   Number of children: 1   Years of education: Not on file   Highest education level: Master's degree (e.g., MA, MS, MEng, MEd, MSW, MBA)  Occupational History   Occupation: Retired  Tobacco Use   Smoking status: Never   Smokeless tobacco: Never   Tobacco comments:    Smoking cessation materials not required  Vaping Use   Vaping Use: Never used   Substance and Sexual Activity   Alcohol use: Not Currently    Alcohol/week: 1.0 standard drink of alcohol    Types: 1 Glasses of wine per week    Comment: RARELY   Drug use: No   Sexual activity: Yes    Partners: Male    Birth control/protection: Post-menopausal  Other Topics Concern   Not on file  Social History Narrative   She is married , only has one son and he was diagnosed with colon cancer at age 36.    Social Determinants of Health   Financial Resource Strain: Low Risk  (04/26/2022)   Overall Financial Resource Strain (CARDIA)    Difficulty of Paying Living Expenses: Not hard at all  Food Insecurity: No Food Insecurity (04/26/2022)   Hunger Vital Sign    Worried About Running Out of Food in the Last Year: Never true    Ran Out of Food in the Last Year: Never true  Transportation Needs: No Transportation Needs (04/26/2022)   PRAPARE - Hydrologist (Medical): No    Lack of Transportation (Non-Medical): No  Physical Activity: Sufficiently Active (04/26/2022)   Exercise Vital Sign    Days of Exercise per Week: 7 days    Minutes of Exercise per Session: 30 min  Stress: No Stress Concern Present (04/26/2022)   [place]    Feeling of Stress : Not at all  Social Connections: [place] (04/26/2022)   Social Connection and Isolation Panel [NHANES]    Frequency of Communication with Friends and Family: More than three times a week    Frequency of Social Gatherings with Friends and Family: More than three times a week    Attends Religious Services: More than 4 times per year    Active Member of [employer] or Organizations: Yes    Attends Music therapist: More than 4 times per year    Marital Status: Married     Family History: The patient's family history includes Breast cancer (age of onset: 40) in her maternal aunt; Cancer in her father and mother; Colon  cancer in her son; Diabetes in her mother; Hypertension in her father and mother; Kidney disease in her mother.  There is no history of Kidney cancer or Bladder Cancer.  ROS:   Please see the history of present illness.    All other systems reviewed and are negative.  EKGs/Labs/Other Studies Reviewed:    The following studies were reviewed today:  March 31, 2021 echo EF 60 RV normal No MR  January 25, 2021 left and right heart cath Mild to moderate, non-obstructive coronary artery disease, including 30% mid LAD disease, 50-60% mid/distal LCx stenosis, and 60% proximal/mid RCA lesion. Mildly to moderately reduced left ventricular systolic function with mid anterior hypo/akinesis; query atypical Takotsubo variant.  LVEF ~45%. Mildly elevated left heart filling pressures. Moderately elevated right heart filling and pulmonary artery pressures with significant pulmonary vascular resistance. Mildly reduced Fick cardiac output/index.  EKG from April 27, 2022 reviewed and shows atrial flutter with a QTc of about 430 ms.   Recent Labs: 04/11/2022: ALT 21; B Natriuretic Peptide 347.5; BUN 28; Creatinine, Ser 1.03; Hemoglobin 13.8; Magnesium 2.4; Platelets 199; Potassium 4.4; Sodium 139 04/12/2022: TSH 1.422  Recent Lipid Panel    Component Value Date/Time   CHOL 127 07/12/2021 0844   CHOL 110 01/12/2016 0817   TRIG 59 07/12/2021 0844   HDL 70 07/12/2021 0844   HDL 59 01/12/2016 0817   CHOLHDL 1.8 07/12/2021 0844   LDLCALC 43 07/12/2021 0844    Physical Exam:    VS:  BP (!) 148/68   Pulse (!) 55   Ht '5\' 3"'$  (1.6 m)   Wt 171 lb (77.6 kg)   SpO2 98%   BMI 30.29 kg/m     Wt Readings from Last 3 Encounters:  06/13/22 171 lb (77.6 kg)  05/01/22 174 lb (78.9 kg)  04/27/22 173 lb (78.5 kg)     GEN:  Well nourished, well developed in no acute distress HEENT: Normal NECK: No JVD; No carotid bruits LYMPHATICS: No lymphadenopathy CARDIAC: rrr, no murmurs, rubs,  gallops RESPIRATORY:  Clear to auscultation without rales, wheezing or rhonchi  ABDOMEN: Soft, non-tender, non-distended MUSCULOSKELETAL:  No edema; No deformity  SKIN: Warm and dry NEUROLOGIC:  Alert and oriented x 3 PSYCHIATRIC:  Normal affect       ASSESSMENT:    1. AF (paroxysmal atrial fibrillation) ([place])   2. Atrial flutter, unspecified type ([place])   3. Chronic combined systolic and diastolic CHF, NYHA class 2 (HCC)    PLAN:    In order of problems listed above:  #Persistent atrial fibrillation and flutter Is on amiodarone 100 mg by mouth daily Takes Eliquis for stroke prophylaxis A long discussion today about the options for her atrial fibrillation including continued antiarrhythmic drugs and catheter ablation.  The patient is very interested in catheter ablation.  I discussed the procedure in detail including risks, recovery and likelihood of success and she wishes to proceed.  Discussed treatment options today for their AF including antiarrhythmic drug therapy and ablation. Discussed risks, recovery and likelihood of success. Discussed potential need for repeat ablation procedures and antiarrhythmic drugs after an initial ablation. They wish to proceed with scheduling.  Risk, benefits, and alternatives to EP study and radiofrequency ablation for afib were also discussed in detail today. These risks include but are not limited to stroke, bleeding, vascular damage, tamponade, perforation, damage to the esophagus, lungs, and other structures, pulmonary vein stenosis, worsening renal function, and death. The patient understands these risk and wishes to proceed.  We will therefore proceed with catheter ablation at the next available time.  Carto, ICE, anesthesia are requested for  the procedure.  Will also obtain CT PV protocol prior to the procedure to exclude LAA thrombus and further evaluate atrial anatomy.    #Chronic diastolic heart failure Warm and relatively dry on exam  today.  NYHA class II.  #Hypertension Above goal today.  Recommend checking blood pressures 1-2 times per week at home and recording the values.  Recommend bringing these recordings to the primary care physician.      Medication Adjustments/Labs and Tests Ordered: Current medicines are reviewed at length with the patient today.  Concerns regarding medicines are outlined above.  No orders of the defined types were placed in this encounter.  No orders of the defined types were placed in this encounter.    Signed, Hilton Cork. Quentin Ore, MD, [facility], [facility] 06/13/2022 9:27 AM    Electrophysiology [facility]

## 2022-06-13 NOTE — Patient Instructions (Signed)
Medication Instructions:  Your physician recommends that you continue on your current medications as directed. Please refer to the Current Medication list given to you today.  *If you need a refill on your cardiac medications before your next appointment, please call your pharmacy*  Lab Work: BMET and CBC at [facility] prior to CT scan If you have labs (blood work) drawn today and your tests are completely normal, you will receive your results only by: [place] (if you have MyChart) OR A paper copy in the mail If you have any lab test that is abnormal or we need to change your treatment, we will call you to review the results.  Testing/Procedures: Your physician has requested that you have cardiac CT. Cardiac computed tomography (CT) is a painless test that uses an x-ray machine to take clear, detailed pictures of your heart. For further information please visit [employer]iest[facility]tn. Please follow instruction sheet as given.  Your physician has recommended that you have an ablation. Catheter ablation is a medical procedure used to treat some cardiac arrhythmias (irregular heartbeats). During catheter ablation, a long, thin, flexible tube is put into a blood vessel in your groin (upper thigh), or neck. This tube is called an ablation catheter. It is then guided to your heart through the blood vessel. Radio frequency waves destroy small areas of heart tissue where abnormal heartbeats may cause an arrhythmia to start. Please see the instruction sheet given to you today.  Follow-Up: At [facility], you and your health needs are our priority.  As part of our continuing mission to provide you with exceptional heart care, we have created designated Provider Care Teams.  These Care Teams include your primary Cardiologist (physician) and Advanced Practice Providers (APPs -  Physician Assistants and Nurse Practitioners) who all work together to provide you with the care you need, when you need it.    Your next appointment:   You will be contacted to schedule your follow up appointment.   Important Information About Sugar

## 2022-06-14 ENCOUNTER — Telehealth: Payer: Self-pay

## 2022-06-14 NOTE — Telephone Encounter (Signed)
Work up complete...  Pt is aware of date/time and Instructions will be mailed per pt's request.

## 2022-06-27 ENCOUNTER — Ambulatory Visit (INDEPENDENT_AMBULATORY_CARE_PROVIDER_SITE_OTHER): Payer: Medicare Other

## 2022-06-27 DIAGNOSIS — I480 Paroxysmal atrial fibrillation: Secondary | ICD-10-CM

## 2022-06-27 DIAGNOSIS — I5032 Chronic diastolic (congestive) heart failure: Secondary | ICD-10-CM

## 2022-06-27 DIAGNOSIS — E119 Type 2 diabetes mellitus without complications: Secondary | ICD-10-CM

## 2022-06-27 NOTE — Progress Notes (Unsigned)
Chronic Care Management Pharmacy Note  06/28/2022 Name:  Bonnie Rowland MRN:  [id number] DOB:  07-10-1940  Summary: Patient presents for CCM follow-up. She continues to stay active walking most mornings, Tai Chi, and physical therapy exercises.   Recommendations/Changes made from today's visit: DECREASE Lantus to 12 units daily. Suspect patient may be able to maintain control without Humalog, but will defer until patient's next follow-up with endocrinology.   Plan: CPP follow-up in 3 months   Subjective: Bonnie Rowland is an 81 y.o. year old female who is a primary patient of Steele Sizer, MD.  The CCM team was consulted for assistance with disease management and care coordination needs.    Engaged with patient by telephone for follow up visit in response to provider referral for pharmacy case management and/or care coordination services.   Consent to Services:  The patient was given information about Chronic Care Management services, agreed to services, and gave verbal consent prior to initiation of services.  Please see initial visit note for detailed documentation.   Patient Care Team: Steele Sizer, MD as PCP - General (Family Medicine) Minna Merritts, MD as PCP - Cardiology (Cardiology) Vickie Epley, MD as PCP - Electrophysiology (Cardiology) Minna Merritts, MD as Consulting Physician (Cardiology) Dasher, Rayvon Char, MD as Consulting Physician (Dermatology) Estill Cotta, MD as Consulting Physician (Ophthalmology) Lonia Farber, MD as Consulting Physician (Internal Medicine) Beverly Gust, MD as Consulting Physician (Otolaryngology) Iris Pert, Dana as Referring Physician (Chiropractic Medicine) Germaine Pomfret, [facility] as Pharmacist (Pharmacist)  Recent office visits: 01/09/22: Patient presented to Dr. Ky Barban for follow-up.   Recent consult visits: 06/13/22: Patient presented to Dr. Quentin Ore (Cardiology) for follow-up.  05/01/22:  Patient presented to [name], [place] (Cardiology) for Follow-up 04/27/22: Patient presented to Mayra Reel, NP (Cardiology) for hospital follow-up.  09/282023 Riesa Pope, MD (Endocrinology) for Follow-up- Changed: Lantus decreased to 14 units, Started: Farxiga 10 mg tablet once daily, Follow-up in 3-4 months   Hospital visits: 04/11/22: Patient presented to ED for A-Fib. Carvedilol 6.25 mg BID.  01/19/22: Patient presented to ED for skin tear  01/23/22: Patient presented to ED for cellulitis   Objective:  Lab Results  Component Value Date   CREATININE 1.03 (H) 04/11/2022   BUN 28 (H) 04/11/2022   GFRNONAA 55 (L) 04/11/2022   GFRAA 102 04/11/2018   NA 139 04/11/2022   K 4.4 04/11/2022   CALCIUM 9.5 04/11/2022   CO2 23 04/11/2022   GLUCOSE 210 (H) 04/11/2022    Lab Results  Component Value Date/Time   HGBA1C 6.4 (H) 04/12/2022 01:01 PM   HGBA1C 6.7 09/21/2021 12:00 AM   HGBA1C 7.1 03/23/2021 12:00 AM   MICROALBUR <0.2 01/09/2022 08:49 AM   MICROALBUR Negative 08/21/2019 12:00 AM   MICROALBUR 0 02/18/2017 09:01 AM   MICROALBUR 50 04/23/2016 10:37 AM    Last diabetic Eye exam:  Lab Results  Component Value Date/Time   HMDIABEYEEXA Retinopathy (A) 01/31/2022 12:00 AM    Last diabetic Foot exam: No results found for: "HMDIABFOOTEX"   Lab Results  Component Value Date   CHOL 127 07/12/2021   HDL 70 07/12/2021   LDLCALC 43 07/12/2021   TRIG 59 07/12/2021   CHOLHDL 1.8 07/12/2021       Latest Ref Rng & Units 04/11/2022   11:47 PM 07/12/2021    8:44 AM 04/12/2021    1:54 PM  Hepatic Function  Total Protein 6.5 - 8.1 g/dL 7.4  6.4  6.9  Albumin 3.5 - 5.0 g/dL 4.3   4.3   AST 15 - 41 U/L 45  35  31   ALT 0 - 44 U/L _0 Alk Phosphatase 38 - 126 U/L 64   78   Total Bilirubin 0.3 - 1.2 mg/dL 1.4  0.8  0.7     Lab Results  Component Value Date/Time   TSH 1.422 04/12/2022 01:01 PM   TSH 6.13 (H) 01/09/2022 08:49 AM   TSH 8.69 (H)  07/12/2021 08:44 AM   FREET4 1.53 04/25/2021 10:32 AM       Latest Ref Rng & Units 04/11/2022   11:47 PM 07/12/2021    8:44 AM 01/31/2021    7:03 AM  CBC  WBC 4.0 - 10.5 K/uL 9.3  4.9  8.4   Hemoglobin 12.0 - 15.0 g/dL 13.8  13.0  13.9   Hematocrit 36.0 - 46.0 % 42.0  39.3  41.1   Platelets 150 - 400 K/uL 199  181  285     Lab Results  Component Value Date/Time   VD25OH 28 (L) 08/22/2017 09:35 AM    Clinical ASCVD: No  The ASCVD Risk score (Arnett DK, et al., 2019) failed to calculate for the following reasons:   The valid total cholesterol range is 130 to 320 mg/dL       04/26/2022    1:59 PM 02/12/2022    9:11 AM 01/09/2022    8:02 AM  Depression screen PHQ 2/9  Decreased Interest 0 0 0  Down, Depressed, Hopeless 0 0 0  PHQ - 2 Score 0 0 0  Altered sleeping  0 0  Tired, decreased energy  0 0  Change in appetite  0 0  Feeling bad or failure about yourself   0 0  Trouble concentrating  0 0  Moving slowly or fidgety/restless  0 0  Suicidal thoughts  0 0  PHQ-9 Score  0 0    -Last DEXA Scan: 09/21/21  T-Score femoral neck: -2.3  T-Score total hip: -1.2  T-Score lumbar spine: +0.8  T-Score forearm radius: -2.9    Social History   Tobacco Use  Smoking Status Never  Smokeless Tobacco Never  Tobacco Comments   Smoking cessation materials not required   BP Readings from Last 3 Encounters:  06/13/22 (!) 148/68  05/01/22 (!) 124/47  04/27/22 100/60   Pulse Readings from Last 3 Encounters:  06/13/22 (!) 55  05/01/22 62  04/27/22 75   Wt Readings from Last 3 Encounters:  06/13/22 171 lb (77.6 kg)  05/01/22 174 lb (78.9 kg)  04/27/22 173 lb (78.5 kg)   BMI Readings from Last 3 Encounters:  06/13/22 30.29 kg/m  05/01/22 30.82 kg/m  04/27/22 30.65 kg/m    Assessment/Interventions: Review of patient past medical history, allergies, medications, health status, including review of consultants reports, laboratory and other test data, was performed as part of  comprehensive evaluation and provision of chronic care management services.   SDOH:  (Social Determinants of Health) assessments and interventions performed: Yes SDOH Interventions    Flowsheet Row Clinical Support from 04/26/2022 in [place] Management from 12/20/2021 in [place] from 04/20/2021 in [place] Management from 02/09/2021 in [facility]  SDOH Interventions      Food Insecurity Interventions Intervention Not Indicated -- Intervention Not Indicated --  Housing Interventions Intervention Not Indicated -- Intervention Not Indicated --  Transportation Interventions Intervention Not Indicated Intervention Not Indicated Intervention Not Indicated --  Utilities Interventions Intervention Not Indicated -- -- --  Financial Strain Interventions Intervention Not Indicated Intervention Not Indicated Intervention Not Indicated Intervention Not Indicated  Physical Activity Interventions Intervention Not Indicated -- Intervention Not Indicated --  Stress Interventions Intervention Not Indicated -- Intervention Not Indicated --  Social Connections Interventions Intervention Not Indicated -- Intervention Not Indicated --       SDOH Screenings   Food Insecurity: No Food Insecurity (04/26/2022)  Housing: Low Risk  (04/26/2022)  Transportation Needs: No Transportation Needs (04/26/2022)  Utilities: Not At Risk (04/26/2022)  Alcohol Screen: Low Risk  (04/20/2021)  Depression (PHQ2-9): Low Risk  (04/26/2022)  Financial Resource Strain: Low Risk  (04/26/2022)  Physical Activity: Sufficiently Active (04/26/2022)  Social Connections: Socially Integrated (04/26/2022)  Stress: No Stress Concern Present (04/26/2022)  Tobacco Use: Low Risk  (06/13/2022)    [place]  Allergies  Allergen Reactions   Cyclobenzaprine Hypertension   Cheese Other (See Comments)     bloating   Ciprofloxacin Hcl Other (See Comments)    Muscle pain   Coconut (Cocos Nucifera)     Upset stomach   Keflex [Cephalexin] Other (See Comments)    Patient prefers not to take this medication due to the side effects, caused tendon issues    Medications Reviewed Today     Reviewed by Guido Sander, CMA (Certified Medical Assistant) on 06/13/22 at [place] List Status: <None>   Medication Order Taking? Sig Documenting Provider Last Dose Status Informant  acetaminophen (TYLENOL) 650 MG CR tablet [id number] Yes Take 1,300 mg by mouth at bedtime. [provider] Taking Active Self  ALFALFA PO [id number] Yes Take 1 tablet by mouth daily. [provider] Taking Active Self  amiodarone (PACERONE) 200 MG tablet [id number] Yes Take 0.5 tablets (100 mg total) by mouth daily. Rise Mu, [name] Taking Active Self  ascorbic Acid (VITAMIN C) 500 MG CPCR [id number] Yes Take 500 mg by mouth daily. [provider] Taking Active Self  B Complex-C (B-COMPLEX WITH VITAMIN C) tablet [id number] Yes Take 1 tablet by mouth 2 (two) times daily. [provider] Taking Active Self  BD PEN NEEDLE NANO U/F 32G X 4 MM MISC [id number] Yes USE 4 TIMES DAILY (HUMALOG 3 TIMES DAILY AND LANTUS ONCE DAILY) OFFICE NOTIFIED 08/06/17 Steele Sizer, MD Taking Active Self  BLACK ELDERBERRY,BERRY-FLOWER, PO [id number] Yes Take 15 mLs by mouth daily. Liquid [provider] Taking Active Self  Calcium Carbonate (CALCIUM 600 PO) [id number] Yes Take 600 mg by mouth daily. [provider] Taking Active Self  carvedilol (COREG) 6.25 MG tablet [id number] Yes Take 1 tablet (6.25 mg total) by mouth 2 (two) times daily with a meal. Agbata, Tochukwu, MD Taking Active   cholecalciferol (VITAMIN D) 1000 UNITS tablet [id number] Yes Take 1,000 Units by mouth daily. [place] [provider] Taking Active Self  clobetasol ointment (TEMOVATE) 0.05 % [id number] Yes APPLY 1 APPLICATION TOPICALLY 2  TIMES DAILY AS NEEDED (BUG BITES). Steele Sizer, MD Taking Active Self  clotrimazole (GYNE-LOTRIMIN) 1 % vaginal cream [id number] Yes Place 1 Applicatorful vaginally at bedtime. Steele Sizer, MD Taking Active   Coenzyme Q10 (COQ10) 200 MG CAPS [id number] Yes Take 200 mg by mouth daily. [provider] Taking Active Self  Continuous Blood Gluc Sensor ([place]) [place] [id number] Yes by Does not apply route. [provider] Taking Active Self    Discontinued 01/16/22 1722 (Reorder)  Elastic Bandages & Supports (MEDICAL COMPRESSION STOCKINGS) [place] [id number] Yes 1 each by Does not apply route daily. Steele Sizer, MD Taking Active Self  ELIQUIS 5 MG TABS tablet [id number] Yes TAKE 1 TABLET BY MOUTH TWICE A DAY Gollan, Kathlene November, MD Taking Active Self  estradiol (ESTRACE VAGINAL) 0.1 MG/GM vaginal cream [id number] Yes 1/2 gm once weekly using applicator, apply blueberry sized amount of cream using tip of finger to urethra twice weekly Nori Riis, [name] Taking Active Self  Exenatide ER (BYDUREON BCISE) 2 MG/0.85ML AUIJ [id number] Yes Inject 2 mg into the skin every Sunday. [provider] Taking Active Self  FARXIGA 10 MG TABS tablet [id number] Yes Take 10 mg by mouth daily. [provider] Taking Active Self  furosemide (LASIX) 40 MG tablet [id number] Yes Take 80 mg by mouth. [provider] Taking Active   Glucosamine 500 MG TABS [id number] Yes Take 500 mg by mouth daily. [provider] Taking Active Self  HUMALOG KWIKPEN 100 UNIT/ML Mayer Masker [id number] Yes Inject 2-6 Units into the skin 3 (three) times daily before meals. If needed sliding scale [provider] Taking Active Self  insulin glargine (LANTUS) 100 unit/mL SOPN [id number] Yes Inject 14 Units into the skin at bedtime. [provider] Taking Active Self  Krill Oil 1000 MG CAPS [id number] Yes Take 1,000 mg by mouth daily. [provider] Taking  Active Self  Mag Aspart-Potassium Aspart (POTASSIUM & MAGNESIUM ASPARTAT PO) [id number] Yes Take 1 tablet by mouth daily. [provider] Taking Active Self  Multiple Vitamins-Minerals (PRESERVISION AREDS 2) CAPS [id number] Yes Take 1 capsule by mouth 2 (two) times daily. [provider] Taking Active Self  OVER THE [place] [id number] Yes Place 1 application into both eyes See admin instructions. Blephadex eyelid foam, apply to eyelids once daily [provider] Taking Active Self  OVER THE COUNTER MEDICATION [id number] Yes Take 1 tablet by mouth daily. Vita-Lea Gold otc supplement With out vitamin K ([place] products) [provider] Taking Active Self  OVER THE COUNTER MEDICATION [id number] Yes Take 1 tablet by mouth See admin instructions. Nutriferon otc supplement take Take 1 tablet on Sun, Tues, Thurs, Sat. Take 1 tablet twice daily on Mon, Wed, and Fri [provider] Taking Active Self  Probiotic Product (PROBIOTIC PO) [id number] Yes Take 1 capsule by mouth at bedtime. [provider] Taking Active Self  PROLIA 60 MG/ML SOSY injection [id number] Yes Inject 60 mg into the skin every 6 (six) months. [provider] Taking Active Self  Propylene Glycol (SYSTANE BALANCE OP) [id number] Yes Place 1 drop into both eyes daily as needed (dry eyes). [provider] Taking Active Self  rosuvastatin (CRESTOR) 10 MG tablet [id number] Yes TAKE 1 TABLET BY MOUTH EVERY DAY Steele Sizer, MD Taking Active Self  sacubitril-valsartan (ENTRESTO) 49-51 MG [id number] Yes Take 1 tablet by mouth 2 (two) times daily. Alisa Graff, FNP Taking Active Self  Sodium Chloride-Sodium Bicarb (NETI POT SINUS [place] NA) [id number] Yes Place 1 Dose into the nose at bedtime. [provider] Taking Active Self  tiZANidine (ZANAFLEX) 2 MG tablet [id number] Yes TAKE 1-2 TABLETS (2-4 MG TOTAL) BY MOUTH AT BEDTIME. Steele Sizer, MD Taking Active    Vitamin D, Ergocalciferol, (DRISDOL) 1.25 MG (50000 UT) CAPS capsule [id number] Yes Take 50,000 Units by mouth every Saturday. [provider] Taking Active Self  Med List Note Lona Millard, RN 01/13/15 1037): No UDS needed No meds prescribed by Primus Bravo  Patient Active Problem List   Diagnosis Date Noted   Rapid atrial fibrillation ([place]) 04/12/2022   Obesity (BMI 30-39.9)    Contusion of foot 03/08/2022   Pain due to onychomycosis of toenails of both feet 09/07/2021   Chronic diastolic CHF (congestive heart failure) (HCC)    AF (paroxysmal atrial fibrillation) ([place]) 01/23/2021   Controlled type 2 diabetes mellitus without complication, without long-term current use of insulin ([place]) 01/23/2021   Plantar fasciitis, bilateral 05/21/2019   Age-related osteoporosis without current pathological fracture 11/05/2017   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([place]) 07/09/2017   Atherosclerosis of abdominal aorta ([place]) 07/09/2017   Bilateral carotid bruits 08/24/2015   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   Chronic pain 01/10/2015   History of melanoma excision 01/10/2015   Spinal stenosis, lumbar region, 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   DDD (degenerative disc disease), lumbar 11/14/2014   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   Hyperlipemia 11/07/2009   Hypertension, benign 11/07/2009   Atherosclerosis of renal artery ([place]) 11/07/2009   Perennial allergic rhinitis 11/27/2007   Lumbosacral neuritis 01/17/2007    Immunization History  Administered Date(s) Administered   Fluad Quad(high Dose 65+) 03/19/2019, 04/20/2020   Influenza, High Dose Seasonal PF 03/10/2015, 03/02/2016, 03/12/2017, 04/07/2018, 04/13/2021, 04/17/2022   Moderna Covid-19 Vaccine Bivalent Booster 81yr & up 05/17/2020, 11/15/2020, 03/17/2021   Moderna Sars-Covid-2 Vaccination 07/14/2019, 08/11/2019, 05/17/2020, 11/17/2020, 03/17/2021   Pfizer Covid-19 Vaccine Bivalent Booster 123yr& up 11/28/2021, 05/11/2022    Pneumococcal Conjugate-13 07/13/2013   Pneumococcal Polysaccharide-23 03/31/2010   Respiratory Syncytial Virus Vaccine,Recomb Aduvanted(Arexvy) 05/05/2022   Rsv, Bivalent, Protein Subunit Rsvpref,pf (AEvans Lance11/09/2021   Tdap 03/07/2012, 01/19/2022   Zoster Recombinat (Shingrix) 02/02/2017, 04/05/2017   Zoster, Live 06/01/2010    Conditions to be addressed/monitored:  Hypertension, Hyperlipidemia, Diabetes, Atrial Fibrillation, Heart Failure, Coronary Artery Disease, Osteoporosis, Allergic Rhinitis, and Chronic Pain  Care Plan : General Pharmacy (Adult)  Updates made by FlGermaine PomfretRPH since 06/28/2022 12:00 AM     Problem: Hypertension, Hyperlipidemia, Diabetes, Atrial Fibrillation, Heart Failure, Coronary Artery Disease, Osteoporosis, Allergic Rhinitis, and Chronic Pain   Priority: High     Long-Range Goal: Patient-Specific Goal   Start Date: 02/10/2021  Expected End Date: 03/31/2023  This Visit's Progress: On track  Recent Progress: On track  Priority: High  Note:   Current Barriers:  No barriers noted  Pharmacist Clinical Goal(s):  Patient will maintain control of diabetes as evidenced by A1c less than 8%  maintain control of heart failure as evidenced by stable weight, fluid status through collaboration with PharmD and provider.   Interventions: 1:1 collaboration with SoSteele SizerMD regarding development and update of comprehensive plan of care as evidenced by provider attestation and co-signature Inter-disciplinary care team collaboration (see longitudinal plan of care) Comprehensive medication review performed; medication list updated in electronic medical record  Diabetes (A1c goal <8%) -Controlled -Managed by Dr. O'Honor Junes-Current medications: Byduren BCISE 2 mg weekly Farxiga 10 mg daily  Humalog 2-6 units three times daily She typically requires 4-6 units around twice daily.  Lantus 14 units nightly.  -Medications previously tried: NA   Utilizing freestyle libre system -Current home glucose readings: 110.  Fasting: 90-140s  30-day average: 132  -Reports hypoglycemic symptoms: Mornings she is low or borderline low.   DECREASE Lantus to 12 units daily. Suspect patient may be able to maintain control without Humalog, but will defer until patient's next follow-up with endocrinology.   Heart Failure (Goal: manage symptoms and prevent exacerbations) -Controlled -Last ejection fraction:  45-50% (Date: 01/23/21) -HF type: Combined Systolic and Diastolic -NYHA Class: II (slight limitation of activity) -AHA HF Stage: C (Heart disease and symptoms present) -Current treatment: Carvedilol 6.25 mg twice daily  Entresto 49-51 mg twice daily  Farxiga 10 mg daily  Furosemide 40 mg twice daily   -Medications previously tried: Metoprolol (Bradycardia), irbesartan, HCTZ, Telmisartan -Current home BP/HR readings: HR ranging between 48-70, reports BP 130s/70s   -Patient weighs daily, reports weight has been stable around 172   -Recommended to continue current medication  Atrial Fibrillation (Goal: prevent stroke and major bleeding) -Controlled -CHADSVASC: 6 -Current treatment: Rate control:  Amiodarone 100 mg tablet daily Carvedilol 6.25 mg twice daily  Anticoagulation: Eliquis 5 mg twice daily  -Medications previously tried: Flecainide,  -Patient experienced episode of A-Fib following steroid shot requiring admission to the ED. Ablation appointment scheduled for Mar 26th.  -Continue current medications  Hyperlipidemia: (LDL goal < 70) -Controlled -Current treatment: Rosuvastatin 10 mg daily  -Medications previously tried: NA  -Recommended to continue current medication  Osteoporosis (Goal Prevent bone fractures) -Controlled -Patient is a candidate for pharmacologic treatment due to T-Score < -2.5 in femoral neck -Current treatment  Ergocalciferol 50,000 units weekly Vitamin D3 1000 units daily Prolia 60 mg into the skin  every 6 months (next shot due September) -Medications previously tried: NA  -Recommended to continue current medication  Patient Goals/Self-Care Activities Patient will:  - check glucose 3-4 times daily, document, and provide at future appointments check blood pressure weekly, document, and provide at future appointments weigh daily, and contact provider if weight gain of greater than 2 pounds in 24 hours  Follow Up Plan: Telephone follow up appointment with care management team member scheduled for:  10/02/2021 at 3:45 PM     Medication Assistance: None required.  Patient affirms current coverage meets needs.  Compliance/Adherence/Medication fill history: Care Gaps: COVID-19 Vaccine Influenza vaccine   Star-Rating Drugs: Rosuvastatin 10 mg last filled on 06/20/22 for a 90-Day supply with [facility]  Patient's preferred pharmacy is:  [id number] [place] [address]Phone: [phone]: [phone] [place] [address] [place]Phone: [phone]: [phone]  Uses pill box? Yes Pt endorses 100% compliance  We discussed: Current pharmacy is preferred with insurance plan and patient is satisfied with pharmacy services Patient decided to: Continue current medication management strategy  Care Plan and Follow Up Patient Decision:  Patient agrees to Care Plan and Follow-up.  Plan: Telephone follow up appointment with care management team member scheduled for:  10/02/2021 at 3:45 PM  AMalva Limes CDimond[phone]

## 2022-06-28 DIAGNOSIS — M9901 Segmental and somatic dysfunction of cervical region: Secondary | ICD-10-CM | POA: Diagnosis not present

## 2022-06-28 DIAGNOSIS — M5033 Other cervical disc degeneration, cervicothoracic region: Secondary | ICD-10-CM | POA: Diagnosis not present

## 2022-06-28 DIAGNOSIS — M9903 Segmental and somatic dysfunction of lumbar region: Secondary | ICD-10-CM | POA: Diagnosis not present

## 2022-06-28 DIAGNOSIS — M5416 Radiculopathy, lumbar region: Secondary | ICD-10-CM | POA: Diagnosis not present

## 2022-06-28 NOTE — Patient Instructions (Signed)
Visit Information It was great speaking with you today!  Please let me know if you have any questions about our visit.   Goals Addressed             This Visit's Progress    Monitor and Manage My Blood Sugar-Diabetes Type 2   On track    Timeframe:  Long-Range Goal Priority:  High Start Date: 02/09/2021                             Expected End Date:  02/10/2023                     Follow Up within 90 days    - check blood sugar at prescribed times - check blood sugar before and after exercise - check blood sugar if I feel it is too high or too low - take the blood sugar meter to all doctor visits    Why is this important?   Checking your blood sugar at home helps to keep it from getting very high or very low.  Writing the results in a diary or log helps the doctor know how to care for you.  Your blood sugar log should have the time, date and the results.  Also, write down the amount of insulin or other medicine that you take.  Other information, like what you ate, exercise done and how you were feeling, will also be helpful.     Notes:         Patient Care Plan: General Pharmacy (Adult)     Problem Identified: Hypertension, Hyperlipidemia, Diabetes, Atrial Fibrillation, Heart Failure, Coronary Artery Disease, Osteoporosis, Allergic Rhinitis, and Chronic Pain   Priority: High     Long-Range Goal: Patient-Specific Goal   Start Date: 02/10/2021  Expected End Date: 03/31/2023  This Visit's Progress: On track  Recent Progress: On track  Priority: High  Note:   Current Barriers:  No barriers noted  Pharmacist Clinical Goal(s):  Patient will maintain control of diabetes as evidenced by A1c less than 8%  maintain control of heart failure as evidenced by stable weight, fluid status through collaboration with PharmD and provider.   Interventions: 1:1 collaboration with Steele Sizer, MD regarding development and update of comprehensive plan of care as evidenced by  provider attestation and co-signature Inter-disciplinary care team collaboration (see longitudinal plan of care) Comprehensive medication review performed; medication list updated in electronic medical record  Diabetes (A1c goal <8%) -Controlled -Managed by Dr. Honor Junes  -Current medications: Byduren BCISE 2 mg weekly Farxiga 10 mg daily  Humalog 2-6 units three times daily She typically requires 4-6 units around twice daily.  Lantus 14 units nightly.  -Medications previously tried: NA  Utilizing freestyle libre system -Current home glucose readings: 110.  Fasting: 90-140s  30-day average: 132  -Reports hypoglycemic symptoms: Mornings she is low or borderline low.   DECREASE Lantus to 12 units daily. Suspect patient may be able to maintain control without Humalog, but will defer until patient's next follow-up with endocrinology.   Heart Failure (Goal: manage symptoms and prevent exacerbations) -Controlled -Last ejection fraction: 45-50% (Date: 01/23/21) -HF type: Combined Systolic and Diastolic -NYHA Class: II (slight limitation of activity) -AHA HF Stage: C (Heart disease and symptoms present) -Current treatment: Carvedilol 6.25 mg twice daily  Entresto 49-51 mg twice daily  Farxiga 10 mg daily  Furosemide 40 mg twice daily   -Medications previously tried: Metoprolol (Bradycardia),  irbesartan, HCTZ, Telmisartan -Current home BP/HR readings: HR ranging between 48-70, reports BP 130s/70s   -Patient weighs daily, reports weight has been stable around 172   -Recommended to continue current medication  Atrial Fibrillation (Goal: prevent stroke and major bleeding) -Controlled -CHADSVASC: 6 -Current treatment: Rate control:  Amiodarone 100 mg tablet daily Carvedilol 6.25 mg twice daily  Anticoagulation: Eliquis 5 mg twice daily  -Medications previously tried: Flecainide,  -Patient experienced episode of A-Fib following steroid shot requiring admission to the ED. Ablation  appointment scheduled for Mar 26th.  -Continue current medications  Hyperlipidemia: (LDL goal < 70) -Controlled -Current treatment: Rosuvastatin 10 mg daily  -Medications previously tried: NA  -Recommended to continue current medication  Osteoporosis (Goal Prevent bone fractures) -Controlled -Patient is a candidate for pharmacologic treatment due to T-Score < -2.5 in femoral neck -Current treatment  Ergocalciferol 50,000 units weekly Vitamin D3 1000 units daily Prolia 60 mg into the skin every 6 months (next shot due September) -Medications previously tried: NA  -Recommended to continue current medication  Patient Goals/Self-Care Activities Patient will:  - check glucose 3-4 times daily, document, and provide at future appointments check blood pressure weekly, document, and provide at future appointments weigh daily, and contact provider if weight gain of greater than 2 pounds in 24 hours  Follow Up Plan: Telephone follow up appointment with care management team member scheduled for:  10/02/2021 at 3:45 PM    Patient agreed to services and verbal consent obtained.   Patient verbalizes understanding of instructions and care plan provided today and agrees to view in [place]. Active MyChart status and patient understanding of how to access instructions and care plan via MyChart confirmed with patient.     Malva Limes, [place] Pharmacist Practitioner  [facility] [phone]

## 2022-07-01 DIAGNOSIS — I5032 Chronic diastolic (congestive) heart failure: Secondary | ICD-10-CM

## 2022-07-01 DIAGNOSIS — E119 Type 2 diabetes mellitus without complications: Secondary | ICD-10-CM

## 2022-07-01 DIAGNOSIS — I480 Paroxysmal atrial fibrillation: Secondary | ICD-10-CM

## 2022-07-05 NOTE — Progress Notes (Unsigned)
07/06/2022 6:45 PM   [name] 1940/12/04 [id number]  Referring provider: Steele Sizer, MD [address] [place],  [place] [address]  Urological history: 1. Urge incontinence -cysto (2019) - NED -contributing factors of age, obesity, caffeine, diabetes, vaginal atrophy (using vaginal estrogen cream), ACE inhibitors, alpha blockers, antiarrhythmics and diuretics -PVR 4  mL -Managed with poise pads and vaginal estrogen cream   2. Renal stone -contrast CT 2021 - 3 mm left upper renal stone  3. Renal cyst -contrast CT 2021 - 12 mm left renal inferior pole cyst  HPI: Bonnie Rowland is a 82 y.o. female who presents today for yearly visit.    She has been admitted to the hospital for paroxysmal atrial fibrillation since she is last seen [place].  She is having 1-7 daytime urinations, nocturia x 1-2 with a mild to strong urge to urinate.  She wears 3 absorbent pads daily and she does engage in toilet mapping.  Patient denies any modifying or aggravating factors.  Patient denies any gross hematuria, dysuria or suprapubic/flank pain.  Patient denies any fevers, chills, nausea or vomiting.    PVR 4 mL.  During her hospital stay in October, an urine was collected and noted 21-50 RBC's.    Today, she is not having symptoms of an UTI.    UA yellow cloudy, 2+glucose, SG 1.015, pH 5.5, leuk trace, 11-30 WBC's, 0-2 RBC's, > 10 epithelial cells, moderate bacteria and yeast.    She has had some diarrhea recently and is concerned that she may develop an UTI as she is fearful her diarrhea will contaminate her bladder.    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; left shoulder blade   Cancer ([place]) 05/2014   basal cell removed left temple   Cataract 2003   Cellulitis and abscess of leg, except foot    Conjunctivitis unspecified    Dermatophytosis of nail    Diabetes mellitus    type II   Elevated troponin    Esophageal reflux    Hyperlipidemia    Hypertension    Osteoporosis 2016   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)    Renal artery stenosis (HCC)    Sprain of lumbar region    Thoracic or lumbosacral neuritis or radiculitis, unspecified    Urinary tract infection, site not specified     Surgical History: Past Surgical History:  Procedure Laterality Date   APPENDECTOMY     CARDIAC CATHETERIZATION     CARDIOVERSION N/A 11/30/2020   Procedure: CARDIOVERSION;  Surgeon: Minna Merritts, MD;  Location: [facility] ORS;  Service: Cardiovascular;  Laterality: N/A;   CARDIOVERSION N/A 01/20/2021   Procedure: CARDIOVERSION;  Surgeon: Minna Merritts, MD;  Location: [facility] ORS;  Service: Cardiovascular;  Laterality: N/A;   CARDIOVERSION N/A 01/30/2021   Procedure: CARDIOVERSION;  Surgeon: Wellington Hampshire, MD;  Location: [facility] ORS;  Service: Cardiovascular;  Laterality: N/A;   cataract surgery     COLONOSCOPY     COLONOSCOPY WITH PROPOFOL N/A 05/09/2016  Procedure: COLONOSCOPY WITH PROPOFOL;  Surgeon: Robert Bellow, MD;  Location: [facility] ENDOSCOPY;  Service: Endoscopy;  Laterality: N/A;   DIAGNOSTIC MAMMOGRAM     EYE SURGERY Bilateral    Cataract Extraction   JOINT REPLACEMENT  2016   KNEE ARTHROPLASTY Right 03/30/2015   Procedure: COMPUTER ASSISTED TOTAL KNEE ARTHROPLASTY;  Surgeon: Dereck Leep, MD;  Location: [facility] ORS;  Service: Orthopedics;  Laterality: Right;   KNEE ARTHROSCOPY Right    KNEE CLOSED REDUCTION Right 05/23/2015   Procedure: CLOSED MANIPULATION KNEE;  Surgeon: Dereck Leep, MD;  Location: [facility] ORS;   Service: Orthopedics;  Laterality: Right;   RIGHT/LEFT HEART CATH AND CORONARY ANGIOGRAPHY N/A 01/25/2021   Procedure: RIGHT/LEFT HEART CATH AND CORONARY ANGIOGRAPHY;  Surgeon: Nelva Bush, MD;  Location: [facility];  Service: Cardiovascular;  Laterality: N/A;   TONSILLECTOMY      Home Medications:  Allergies as of 07/06/2022       Reactions   Cyclobenzaprine Hypertension   Cheese Other (See Comments)   bloating   Ciprofloxacin Hcl Other (See Comments)   Muscle pain   Coconut (cocos Nucifera)    Upset stomach   Keflex [cephalexin] Other (See Comments)   Patient prefers not to take this medication due to the side effects, caused tendon issues        Medication List        Accurate as of July 06, 2022 11:59 PM. If you have any questions, ask your nurse or doctor.          acetaminophen 650 MG CR tablet Commonly known as: TYLENOL Take 1,300 mg by mouth at bedtime.   ALFALFA PO Take 1 tablet by mouth daily.   amiodarone 200 MG tablet Commonly known as: PACERONE Take 0.5 tablets (100 mg total) by mouth daily.   ascorbic Acid 500 MG Cpcr Commonly known as: VITAMIN C Take 500 mg by mouth daily.   B-complex with vitamin C tablet Take 1 tablet by mouth 2 (two) times daily.   BD Pen Needle Nano U/F 32G X 4 MM Misc Generic drug: Insulin Pen Needle USE 4 TIMES DAILY (HUMALOG 3 TIMES DAILY AND LANTUS ONCE DAILY) OFFICE NOTIFIED 08/06/17   BLACK ELDERBERRY(BERRY-FLOWER) PO Take 15 mLs by mouth daily. Liquid   Bydureon BCise 2 MG/0.85ML Auij Generic drug: Exenatide ER Inject 2 mg into the skin every Sunday.   CALCIUM 600 PO Take 600 mg by mouth daily.   carvedilol 6.25 MG tablet Commonly known as: COREG Take 1 tablet (6.25 mg total) by mouth 2 (two) times daily with a meal.   cholecalciferol 1000 units tablet Commonly known as: VITAMIN D Take 1,000 Units by mouth daily. Shaklee   clobetasol ointment 0.05 % Commonly known as: TEMOVATE APPLY 1  APPLICATION TOPICALLY 2 TIMES DAILY AS NEEDED (BUG BITES).   clotrimazole 1 % vaginal cream Commonly known as: GYNE-LOTRIMIN Place 1 Applicatorful vaginally at bedtime.   CoQ10 200 MG Caps Take 200 mg by mouth daily.   Eliquis 5 MG Tabs tablet Generic drug: apixaban TAKE 1 TABLET BY MOUTH TWICE A DAY   Entresto 49-51 MG Generic drug: sacubitril-valsartan Take 1 tablet by mouth 2 (two) times daily.   estradiol 0.1 MG/GM vaginal cream Commonly known as: ESTRACE VAGINAL 1/2 gm once weekly using applicator, apply blueberry sized amount of cream using tip of finger to urethra twice weekly   Farxiga 10 MG Tabs tablet Generic drug: dapagliflozin propanediol Take 10 mg by mouth daily.  FreeStyle [employer] Misc by Does not apply route.   furosemide 40 MG tablet Commonly known as: LASIX Take 80 mg by mouth.   Glucosamine 500 MG Tabs Take 500 mg by mouth daily.   HumaLOG KwikPen 100 UNIT/ML KwikPen Generic drug: insulin lispro Inject 2-6 Units into the skin 3 (three) times daily before meals. If needed sliding scale   insulin glargine 100 unit/mL Sopn Commonly known as: LANTUS Inject 12 Units into the skin at bedtime.   Krill Oil 1000 MG Caps Take 1,000 mg by mouth daily.   Medical Compression Stockings Misc 1 each by Does not apply route daily.   NETI POT SINUS Gila NA Place 1 Dose into the nose at bedtime.   OVER THE COUNTER MEDICATION Place 1 application into both eyes See admin instructions. Blephadex eyelid foam, apply to eyelids once daily   OVER THE COUNTER MEDICATION Take 1 tablet by mouth daily. Vita-Lea Gold otc supplement With out vitamin K (Shaklee products)   OVER THE COUNTER MEDICATION Take 1 tablet by mouth See admin instructions. Nutriferon otc supplement take Take 1 tablet on Sun, Tues, Thurs, Sat. Take 1 tablet twice daily on Mon, Wed, and Fri   POTASSIUM & MAGNESIUM ASPARTAT PO Take 1 tablet by mouth daily.   PreserVision AREDS 2  Caps Take 1 capsule by mouth 2 (two) times daily.   PROBIOTIC PO Take 1 capsule by mouth at bedtime.   Prolia 60 MG/ML Sosy injection Generic drug: denosumab Inject 60 mg into the skin every 6 (six) months.   rosuvastatin 10 MG tablet Commonly known as: CRESTOR TAKE 1 TABLET BY MOUTH EVERY DAY   SYSTANE BALANCE OP Place 1 drop into both eyes daily as needed (dry eyes).   tiZANidine 2 MG tablet Commonly known as: ZANAFLEX TAKE 1-2 TABLETS (2-4 MG TOTAL) BY MOUTH AT BEDTIME.   Vitamin D (Ergocalciferol) 1.25 MG (50000 UNIT) Caps capsule Commonly known as: DRISDOL Take 50,000 Units by mouth every Saturday.        Allergies:  Allergies  Allergen Reactions   Cyclobenzaprine Hypertension   Cheese Other (See Comments)    bloating   Ciprofloxacin Hcl Other (See Comments)    Muscle pain   Coconut (Cocos Nucifera)     Upset stomach   Keflex [Cephalexin] Other (See Comments)    Patient prefers not to take this medication due to the side effects, caused tendon issues    Family History: Family History  Problem Relation Age of Onset   Diabetes Mother    Hypertension Mother    Cancer Mother    Kidney disease Mother    Hypertension Father    Cancer Father        Prostate   Breast cancer Maternal Aunt 69   Colon cancer Son    Kidney cancer Neg Hx    Bladder Cancer Neg Hx     Social History:  reports that she has never smoked. She has never used smokeless tobacco. She reports that she does not currently use alcohol after a past usage of about 1.0 standard drink of alcohol per week. She reports that she does not use drugs.  ROS: For pertinent review of systems please refer to history of present illness  Physical Exam: BP (!) 158/68 (BP Location: Left Arm, Patient Position: Sitting, Cuff Size: Large)   Pulse (!) 48   Ht _0  (1.6 m)   Wt 170 lb (77.1 kg)   BMI 30.11 kg/m   Constitutional:  Well nourished. Alert and oriented, No acute distress. HEENT: [place] AT, moist  mucus membranes.  Trachea midline Cardiovascular: No clubbing, cyanosis, or edema. Respiratory: Normal respiratory effort, no increased work of breathing. Neurologic: Grossly intact, no focal deficits, moving all 4 extremities. Psychiatric: Normal mood and affect.    Laboratory Data: Hemoglobin A1c (04/2022) 6.4 Serum creatinine (04/2022) 1.03  Urinalysis Component     Latest Ref Rng 07/06/2022  Specific Gravity, UA     1.005 - 1.030  1.015   pH, UA     5.0 - 7.5  5.5   Color, UA     Yellow  Yellow   Appearance Ur     Clear  Hazy !   Leukocytes,UA     Negative  Trace !   Protein,UA     Negative/Trace  Negative   Glucose, UA     Negative  2+ !   Ketones, UA     Negative  Negative   RBC, UA     Negative  Negative   Bilirubin, UA     Negative  Negative   Urobilinogen, Ur     0.2 - 1.0 mg/dL 0.2   Nitrite, UA     Negative  Negative   Microscopic Examination See below:     Legend: ! Abnormal  Component     Latest Ref Rng 07/06/2022  WBC, UA     0 - 5 /hpf 11-30 !   Epithelial Cells (non renal)     0 - 10 /hpf >10 !   Bacteria, UA     None seen/Few  Moderate !   RBC, Urine     0 - 2 /hpf 0-2   Yeast, UA     None seen  Present !     Legend: ! Abnormal I have reviewed the labs.   Pertinent Imaging  07/06/22 11:28  Scan Result 66m    Assessment and plan:  1.  Urge incontinence -at baseline -not bothersome -continue conservative management -urine sent for culture, will not treat w/ abx unless develop UTI symptoms  2. Vaginal Atrophy -continue to apply the vaginal estrogen cream 3 nights weekly  3. Microscopic hematuria -UA today w/o micro heme  Return in about 1 year (around 07/07/2023) for UA, PVR and OAB questionnaire.  These notes generated with voice recognition software. I apologize for typographical errors.  [place]iscovery BayB[place]ena[place] [phone]

## 2022-07-06 ENCOUNTER — Ambulatory Visit (INDEPENDENT_AMBULATORY_CARE_PROVIDER_SITE_OTHER): Payer: Medicare Other | Admitting: Urology

## 2022-07-06 ENCOUNTER — Encounter: Payer: Self-pay | Admitting: Urology

## 2022-07-06 VITALS — BP 158/68 | HR 48 | Ht 63.0 in | Wt 170.0 lb

## 2022-07-06 DIAGNOSIS — N3281 Overactive bladder: Secondary | ICD-10-CM

## 2022-07-06 DIAGNOSIS — R3129 Other microscopic hematuria: Secondary | ICD-10-CM

## 2022-07-06 DIAGNOSIS — N952 Postmenopausal atrophic vaginitis: Secondary | ICD-10-CM

## 2022-07-06 DIAGNOSIS — N3941 Urge incontinence: Secondary | ICD-10-CM

## 2022-07-06 LAB — URINALYSIS, COMPLETE
Bilirubin, UA: NEGATIVE
Ketones, UA: NEGATIVE
Nitrite, UA: NEGATIVE
Protein,UA: NEGATIVE
RBC, UA: NEGATIVE
Specific Gravity, UA: 1.015 (ref 1.005–1.030)
Urobilinogen, Ur: 0.2 mg/dL (ref 0.2–1.0)
pH, UA: 5.5 (ref 5.0–7.5)

## 2022-07-06 LAB — MICROSCOPIC EXAMINATION: Epithelial Cells (non renal): >10 /hpf — AB (ref 0–10)

## 2022-07-06 LAB — BLADDER SCAN AMB NON-IMAGING

## 2022-07-09 ENCOUNTER — Encounter: Payer: Self-pay | Admitting: Family Medicine

## 2022-07-09 ENCOUNTER — Other Ambulatory Visit: Payer: Self-pay | Admitting: Cardiovascular Disease

## 2022-07-09 DIAGNOSIS — M9903 Segmental and somatic dysfunction of lumbar region: Secondary | ICD-10-CM | POA: Diagnosis not present

## 2022-07-09 DIAGNOSIS — M9901 Segmental and somatic dysfunction of cervical region: Secondary | ICD-10-CM | POA: Diagnosis not present

## 2022-07-09 DIAGNOSIS — M5416 Radiculopathy, lumbar region: Secondary | ICD-10-CM | POA: Diagnosis not present

## 2022-07-09 DIAGNOSIS — M5033 Other cervical disc degeneration, cervicothoracic region: Secondary | ICD-10-CM | POA: Diagnosis not present

## 2022-07-09 LAB — CULTURE, URINE COMPREHENSIVE

## 2022-07-09 NOTE — Telephone Encounter (Signed)
Please contact pt for future appointment with Dr.Gollan.  Pt due for 12 month f/u. Pt needing refill.

## 2022-07-12 ENCOUNTER — Ambulatory Visit: Payer: Medicare Other | Admitting: Family Medicine

## 2022-07-12 ENCOUNTER — Telehealth: Payer: Self-pay | Admitting: Cardiology

## 2022-07-12 MED ORDER — CARVEDILOL 6.25 MG PO TABS: 6.2500 mg | ORAL_TABLET | Freq: Two times a day (BID) | ORAL | 0 refills | Status: DC

## 2022-07-12 NOTE — Telephone Encounter (Signed)
Please advise if ok to refill Carvedilol 6.25 mg bid. Last filled by  Date: 04/12/2022 Department: [facility] EMERGENCY DEPARTMENT Ordering/Authorizing: Collier Bullock, MD

## 2022-07-12 NOTE — Telephone Encounter (Signed)
Ok to refill coreg 6.25 mg BID. #180 w/ no refills- would refill under Mayra Reel, NP that she saw for general cardiology on 04/27/22.

## 2022-07-12 NOTE — Telephone Encounter (Signed)
Patient states she received a call this morning, she doesn't know what it was about.  I didn't see anything document in her chart.  She states she is schedule for an ablation in March and they told her they would call her if they had a cancellation sooner, she thinks it might be about that.

## 2022-07-12 NOTE — Addendum Note (Signed)
Addended by: Britt Bottom on: 07/12/2022 02:02 PM   Modules accepted: Orders

## 2022-07-12 NOTE — Telephone Encounter (Signed)
LVM

## 2022-07-13 NOTE — Telephone Encounter (Signed)
I returned pt's call but she stated that it was a Lauren that called her and she found her number and called her back.   She was appreciative for the call.

## 2022-07-16 NOTE — Telephone Encounter (Signed)
Pt scheduled on 2/2

## 2022-07-19 ENCOUNTER — Encounter: Payer: Self-pay | Admitting: Podiatry

## 2022-07-19 ENCOUNTER — Ambulatory Visit (INDEPENDENT_AMBULATORY_CARE_PROVIDER_SITE_OTHER): Payer: Medicare Other | Admitting: Podiatry

## 2022-07-19 VITALS — BP 170/51

## 2022-07-19 DIAGNOSIS — Q828 Other specified congenital malformations of skin: Secondary | ICD-10-CM | POA: Diagnosis not present

## 2022-07-19 DIAGNOSIS — E119 Type 2 diabetes mellitus without complications: Secondary | ICD-10-CM

## 2022-07-19 DIAGNOSIS — Z7901 Long term (current) use of anticoagulants: Secondary | ICD-10-CM | POA: Diagnosis not present

## 2022-07-19 NOTE — Progress Notes (Signed)
This patient presents to the office saying she has developed pain on the outside of her right midfoot.  She has been walking for exercise and her pain is worsening.  She says she has painful callus. She was dispensed padding for her to wear in her shoes which have worked well.  She requests additional padding. She presents for evaluation and treatment.  General Appearance  Alert, conversant and in no acute stress.  Vascular  Dorsalis pedis and posterior tibial  pulses are palpable  bilaterally.  Capillary return is within normal limits  bilaterally. Temperature is within normal limits  bilaterally.  Neurologic  Senn-Weinstein monofilament wire test within normal limits  bilaterally. Muscle power within normal limits bilaterally.  Nails Thick disfigured discolored nails with subungual debris  from hallux to fifth toes bilaterally. No evidence of bacterial infection or drainage bilaterally.  Orthopedic  No limitations of motion  feet .  No crepitus or effusions noted.  No bony pathology or digital deformities noted.  Palpable pain fifth metabase right foot.  Skin  normotropic skin with no porokeratosis noted bilaterally.  No signs of infections or ulcers noted.     Contusion /  Porokeratosis right foot.  ROV.  Discussed this condition with this patient. Padding dispensed.   RTC 3 months   [name] DPM

## 2022-07-23 ENCOUNTER — Telehealth: Payer: Self-pay

## 2022-07-23 DIAGNOSIS — M9901 Segmental and somatic dysfunction of cervical region: Secondary | ICD-10-CM | POA: Diagnosis not present

## 2022-07-23 DIAGNOSIS — M9903 Segmental and somatic dysfunction of lumbar region: Secondary | ICD-10-CM | POA: Diagnosis not present

## 2022-07-23 DIAGNOSIS — M5416 Radiculopathy, lumbar region: Secondary | ICD-10-CM | POA: Diagnosis not present

## 2022-07-23 DIAGNOSIS — M5033 Other cervical disc degeneration, cervicothoracic region: Secondary | ICD-10-CM | POA: Diagnosis not present

## 2022-07-23 NOTE — Progress Notes (Signed)
Care Management & Coordination Services Pharmacy Assistant   Name: THAILA BOTTOMS  MRN: [id number] DOB: [birth year]  Reason for Encounter: Diabetes  Contacted patient on 07/23/2022 to discuss diabetes disease state.   Recent office visits:  None ID  Recent consult visits:  07/19/2022 [name], DPM (Podiatry) for Nail Problem-No medication changes noted, No orders placed, Patient to follow-up in 3 months.  07/06/2022 Zara Council, [name] (Urology) for Urge Incontinence- No medication changes noted, Lab orders placed, BP 158/68, Patient instructed to follow-up in 1 year  Hospital visits:  None in previous 6 months  Medications: Outpatient Encounter Medications as of 07/23/2022  Medication Sig   acetaminophen (TYLENOL) 650 MG CR tablet Take 1,300 mg by mouth at bedtime.   ALFALFA PO Take 1 tablet by mouth daily.   amiodarone (PACERONE) 200 MG tablet Take 0.5 tablets (100 mg total) by mouth daily.   ascorbic Acid (VITAMIN C) 500 MG CPCR Take 500 mg by mouth daily.   B Complex-C (B-COMPLEX WITH VITAMIN C) tablet Take 1 tablet by mouth 2 (two) times daily.   BD PEN NEEDLE NANO U/F 32G X 4 MM MISC USE 4 TIMES DAILY (HUMALOG 3 TIMES DAILY AND LANTUS ONCE DAILY) OFFICE NOTIFIED 08/06/17   BLACK ELDERBERRY,BERRY-FLOWER, PO Take 15 mLs by mouth daily. Liquid   Calcium Carbonate (CALCIUM 600 PO) Take 600 mg by mouth daily.   carvedilol (COREG) 6.25 MG tablet Take 1 tablet (6.25 mg total) by mouth 2 (two) times daily with a meal.   cholecalciferol (VITAMIN D) 1000 UNITS tablet Take 1,000 Units by mouth daily. [place]   clobetasol ointment (TEMOVATE) 6.31 % APPLY 1 APPLICATION TOPICALLY 2 TIMES DAILY AS NEEDED (BUG BITES).   clotrimazole (GYNE-LOTRIMIN) 1 % vaginal cream Place 1 Applicatorful vaginally at bedtime.   Coenzyme Q10 (COQ10) 200 MG CAPS Take 200 mg by mouth daily.   Continuous Blood Gluc Sensor ([place]) MISC by Does not apply route.   Elastic Bandages &  Supports (MEDICAL COMPRESSION STOCKINGS) MISC 1 each by Does not apply route daily.   ELIQUIS 5 MG TABS tablet TAKE 1 TABLET BY MOUTH TWICE A DAY   estradiol (ESTRACE VAGINAL) 0.1 MG/GM vaginal cream 1/2 gm once weekly using applicator, apply blueberry sized amount of cream using tip of finger to urethra twice weekly   Exenatide ER (BYDUREON BCISE) 2 MG/0.85ML AUIJ Inject 2 mg into the skin every Sunday.   FARXIGA 10 MG TABS tablet Take 10 mg by mouth daily.   furosemide (LASIX) 40 MG tablet Take 80 mg by mouth.   Glucosamine 500 MG TABS Take 500 mg by mouth daily.   HUMALOG KWIKPEN 100 UNIT/ML KiwkPen Inject 2-6 Units into the skin 3 (three) times daily before meals. If needed sliding scale   insulin glargine (LANTUS) 100 unit/mL SOPN Inject 12 Units into the skin at bedtime.   Krill Oil 1000 MG CAPS Take 1,000 mg by mouth daily.   Mag Aspart-Potassium Aspart (POTASSIUM & MAGNESIUM ASPARTAT PO) Take 1 tablet by mouth daily.   Multiple Vitamins-Minerals (PRESERVISION AREDS 2) CAPS Take 1 capsule by mouth 2 (two) times daily.   OVER THE COUNTER MEDICATION Place 1 application into both eyes See admin instructions. Blephadex eyelid foam, apply to eyelids once daily   OVER THE COUNTER MEDICATION Take 1 tablet by mouth daily. Vita-Lea Gold otc supplement With out vitamin K ([place]lee products)   OVER THE COUNTER MEDICATION Take 1 tablet by mouth See admin instructions. Nutriferon otc supplement  take Take 1 tablet on Sun, Tues, Thurs, Sat. Take 1 tablet twice daily on Mon, Wed, and Fri   Probiotic Product (PROBIOTIC PO) Take 1 capsule by mouth at bedtime.   PROLIA 60 MG/ML SOSY injection Inject 60 mg into the skin every 6 (six) months.   Propylene Glycol (SYSTANE BALANCE OP) Place 1 drop into both eyes daily as needed (dry eyes).   rosuvastatin (CRESTOR) 10 MG tablet TAKE 1 TABLET BY MOUTH EVERY DAY   sacubitril-valsartan (ENTRESTO) 49-51 MG Take 1 tablet by mouth 2 (two) times daily.   Sodium  Chloride-Sodium Bicarb (NETI POT SINUS WASH NA) Place 1 Dose into the nose at bedtime.   tiZANidine (ZANAFLEX) 2 MG tablet TAKE 1-2 TABLETS (2-4 MG TOTAL) BY MOUTH AT BEDTIME.   Vitamin D, Ergocalciferol, (DRISDOL) 1.25 MG (50000 UT) CAPS capsule Take 50,000 Units by mouth every Saturday.   [DISCONTINUED] doxycycline (VIBRA-TABS) 100 MG tablet Take 1 tablet (100 mg total) by mouth 2 (two) times daily.   No facility-administered encounter medications on file as of 07/23/2022.   Recent Relevant Labs: Lab Results  Component Value Date/Time   HGBA1C 6.4 (H) 04/12/2022 01:01 PM   HGBA1C 6.7 09/21/2021 12:00 AM   HGBA1C 7.1 03/23/2021 12:00 AM   MICROALBUR <0.2 01/09/2022 08:49 AM   MICROALBUR Negative 08/21/2019 12:00 AM   MICROALBUR 0 02/18/2017 09:01 AM   MICROALBUR 50 04/23/2016 10:37 AM    Kidney Function Lab Results  Component Value Date/Time   CREATININE 1.03 (H) 04/11/2022 11:47 PM   CREATININE 0.95 01/09/2022 08:49 AM   CREATININE 0.85 07/12/2021 08:44 AM   GFRNONAA 55 (L) 04/11/2022 11:47 PM   GFRNONAA 88 04/11/2018 10:32 AM   GFRAA 102 04/11/2018 10:32 AM   Current antihyperglycemic regimen:  Byduren BCISE 2 mg weekly Farxiga 10 mg daily  Humalog 2-6 units three times daily She typically requires 4-6 units around twice daily.  Lantus 12 units nightly.     Patient verbally confirms she is taking the above medications as directed. Yes  What diet changes have been made to improve diabetes control?  What recent interventions/DTPs have been made to improve glycemic control:  DECREASE Lantus to 12 units daily.   Have there been any recent hospitalizations or ED visits since last visit with PharmD? No  Patient denies hypoglycemic symptoms, including Pale, Sweaty, Shaky, Hungry, Nervous/irritable, and Vision changes  Patient denies hyperglycemic symptoms, including blurry vision, excessive thirst, fatigue, polyuria, and weakness  How often are you checking your blood  sugar? Patient has a [employer] are your blood sugars ranging?  Her numbers run between 129-132  During the week, how often does your blood glucose drop below 70? Never  Are you checking your feet daily/regularly? Yes  Adherence Review: Is the patient currently on a STATIN medication? Yes Is the patient currently on ACE/ARB medication? Yes Does the patient have >5 day gap between last estimated fill dates? No  Star Rating Drugs:  Rosuvastatin 10 mg last filled on 06/20/2022 for a 90-Day supply with [facility] Farxiga 10 mg last filled on 04/26/2022 for a 90-Day supply with [facility]  Patient reports that she is doing well. Per patient since she has reduced her Lantus to 12 units her blood sugars have still been normal. She stated she only has an occasional high when she eats a slice of pizza or a sweet snack. Patient is taking all her medications as directed with no problems. Per patient her Wilder Glade is  pretty expensive as she had to pay 244.00 for a 30-DS this month. Patient would like to try for patient assistance via AZ&ME to see if she gets approved.  Application started online via AZ&ME website via patient's approval. After completing the application it came back that the patient was not approved due to income criteria not being meet.  Care Gaps: Annual wellness visit in last year? Yes Last eye exam / retinopathy screening: 01/31/2022 Last diabetic foot exam: 07/19/2022  Patient has a follow-up with [facility] on 10/03/2022 @ [place], [employer], theatre/television/film Phone: [phone]

## 2022-07-26 ENCOUNTER — Other Ambulatory Visit: Payer: Self-pay | Admitting: Family

## 2022-07-30 NOTE — Progress Notes (Unsigned)
Cardiology Office Note    Date:  07/30/2022   ID:  [name], [name] [birth year], MRN [id number]  PCP:  Steele Sizer, MD  Cardiologist:  Ida Rogue, MD  Electrophysiologist:  Vickie Epley, MD   Chief Complaint: Follow up  History of Present Illness:   Bonnie Rowland is a 82 y.o. female with history of ***  ***   Labs independently reviewed: 04/2022 - TSH normal, A1c 6.4, Hgb 13.8, PLT 199, potassium 4.4, BUN 28, SCr 1.03, albumin 4.3, AST 45, ALT normal, magnesium 2.4 07/2021 - TC 127, TG 59, HDL 70, LDL 43  Past Medical History:  Diagnosis Date   Acute cystitis    Acute on chronic combined systolic and diastolic CHF (congestive heart failure) ([place])    Acute respiratory failure with hypoxia ([place]) 01/23/2021   AF (paroxysmal atrial fibrillation) ([place]) 01/23/2021   Allergic rhinitis, cause unspecified    Arthritis 2015   Atherosclerosis of renal artery ([place])    left   Atrial fibrillation ([facility])    Bronchitis, not specified as acute or chronic    Cancer ([place]) 12/2013   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; left shoulder blade   Cancer ([place]) 05/2014   basal cell removed left temple   Cataract 2003   Cellulitis and abscess of leg, except foot    Conjunctivitis unspecified    Dermatophytosis of nail    Diabetes mellitus    type II   Elevated troponin    Esophageal reflux    Hyperlipidemia    Hypertension    Osteoporosis 2016   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)    Renal artery stenosis (HCC)    Sprain of lumbar region    Thoracic or lumbosacral neuritis or radiculitis, unspecified    Urinary tract infection, site not specified     Past Surgical History:  Procedure Laterality Date   APPENDECTOMY     CARDIAC CATHETERIZATION     CARDIOVERSION N/A 11/30/2020   Procedure: CARDIOVERSION;  Surgeon: Minna Merritts, MD;  Location: [facility];  Service: Cardiovascular;  Laterality: N/A;   CARDIOVERSION N/A 01/20/2021   Procedure: CARDIOVERSION;  Surgeon: Minna Merritts, MD;   Location: [facility];  Service: Cardiovascular;  Laterality: N/A;   CARDIOVERSION N/A 01/30/2021   Procedure: CARDIOVERSION;  Surgeon: Wellington Hampshire, MD;  Location: [facility];  Service: Cardiovascular;  Laterality: N/A;   cataract surgery     COLONOSCOPY     COLONOSCOPY WITH PROPOFOL N/A 05/09/2016   Procedure: COLONOSCOPY WITH PROPOFOL;  Surgeon: Robert Bellow, MD;  Location: [facility];  Service: Endoscopy;  Laterality: N/A;   DIAGNOSTIC MAMMOGRAM     EYE SURGERY Bilateral    Cataract Extraction   JOINT REPLACEMENT  2016   KNEE ARTHROPLASTY Right 03/30/2015   Procedure: COMPUTER ASSISTED TOTAL KNEE ARTHROPLASTY;  Surgeon: Dereck Leep, MD;  Location: [facility];  Service: Orthopedics;  Laterality: Right;   KNEE ARTHROSCOPY Right    KNEE CLOSED REDUCTION Right 05/23/2015   Procedure: CLOSED MANIPULATION KNEE;  Surgeon: Dereck Leep, MD;  Location: [facility];  Service: Orthopedics;  Laterality: Right;   RIGHT/LEFT HEART CATH AND CORONARY ANGIOGRAPHY N/A 01/25/2021   Procedure: RIGHT/LEFT HEART CATH AND CORONARY ANGIOGRAPHY;  Surgeon: Nelva Bush, MD;  Location: [facility];  Service: Cardiovascular;  Laterality: N/A;   TONSILLECTOMY      Current Medications: No outpatient medications have been marked as taking for the 08/03/22 encounter (Appointment) with Rise Mu, [name].    Allergies:  Cyclobenzaprine, Cheese, Ciprofloxacin hcl, Coconut (cocos nucifera), and Keflex [cephalexin]   Social History   Socioeconomic History   Marital status: Married    Spouse name: Emergency planning/management officer   Number of children: 1   Years of education: Not on file   Highest education level: Master's degree (e.g., MA, MS, MEng, MEd, MSW, MBA)  Occupational History   Occupation: Retired  Tobacco Use   Smoking status: Never   Smokeless tobacco: Never   Tobacco comments:    Smoking cessation materials not required  Vaping Use   Vaping Use: Never used  Substance and Sexual Activity   Alcohol  use: Not Currently    Alcohol/week: 1.0 standard drink of alcohol    Types: 1 Glasses of wine per week    Comment: RARELY   Drug use: No   Sexual activity: Yes    Partners: Male    Birth control/protection: Post-menopausal  Other Topics Concern   Not on file  Social History Narrative   She is married , only has one son and he was diagnosed with colon cancer at age 86.    Social Determinants of Health   Financial Resource Strain: Low Risk  (04/26/2022)   Overall Financial Resource Strain (CARDIA)    Difficulty of Paying Living Expenses: Not hard at all  Food Insecurity: No Food Insecurity (04/26/2022)   Hunger Vital Sign    Worried About Running Out of Food in the Last Year: Never true    Ran Out of Food in the Last Year: Never true  Transportation Needs: No Transportation Needs (04/26/2022)   PRAPARE - Hydrologist (Medical): No    Lack of Transportation (Non-Medical): No  Physical Activity: Sufficiently Active (04/26/2022)   Exercise Vital Sign    Days of Exercise per Week: 7 days    Minutes of Exercise per Session: 30 min  Stress: No Stress Concern Present (04/26/2022)   [place]    Feeling of Stress : Not at all  Social Connections: [place] (04/26/2022)   Social Connection and Isolation Panel [NHANES]    Frequency of Communication with Friends and Family: More than three times a week    Frequency of Social Gatherings with Friends and Family: More than three times a week    Attends Religious Services: More than 4 times per year    Active Member of [employer] or Organizations: Yes    Attends Music therapist: More than 4 times per year    Marital Status: Married     Family History:  The patient's family history includes Breast cancer (age of onset: 59) in her maternal aunt; Cancer in her father and mother; Colon cancer in her son; Diabetes in her mother;  Hypertension in her father and mother; Kidney disease in her mother. There is no history of Kidney cancer or Bladder Cancer.  ROS:   12-point review of systems is negative unless otherwise noted in the HPI.   EKGs/Labs/Other Studies Reviewed:    Studies reviewed were summarized above. The additional studies were reviewed today:  Limited echo 03/31/2021: 1. Left ventricular ejection fraction, by estimation, is 60 to 65%. The  left ventricle has normal function. The left ventricle has no regional  wall motion abnormalities. Left ventricular diastolic parameters are  indeterminate.   2. Right ventricular systolic function is normal. The right ventricular  size is normal. Tricuspid regurgitation signal is inadequate for assessing  PA pressure.  3. The mitral valve is normal in structure. No evidence of mitral valve  regurgitation. No evidence of mitral stenosis. Moderate mitral annular  calcification.   4. The aortic valve is normal in structure. Aortic valve regurgitation is  not visualized. No aortic stenosis is present.   5. The inferior vena cava is normal in size with greater than 50%  respiratory variability, suggesting right atrial pressure of 3 mmHg.  __________  [facility] 01/25/2021: Conclusions: Mild to moderate, non-obstructive coronary artery disease, including 30% mid LAD disease, 50-60% mid/distal LCx stenosis, and 60% proximal/mid RCA lesion. Mildly to moderately reduced left ventricular systolic function with mid anterior hypo/akinesis; query atypical Takotsubo variant.  LVEF ~45%. Mildly elevated left heart filling pressures. Moderately elevated right heart filling and pulmonary artery pressures with significant pulmonary vascular resistance. Mildly reduced Fick cardiac output/index.   Recommendations: Continue IV diuresis. Advance goal-directed medical therapy for cardiomyopathy, as tolerated.  Patient may benefit from outpatient advanced heart failure  consultation. Increase metoprolol for rate control of atrial fibrillation. Restart IV heparin in 2 hours after TR band removal.  Transition back to apixaban tomorrow AM if no evidence of bleeding/vascular injury. Secondary prevention of coronary artery disease. __________   2D echo 01/23/2021: 1. Left ventricular ejection fraction, by estimation, is 45 to 50%. The  left ventricle has mildly decreased function. The left ventricle  demonstrates regional wall motion abnormalities (see scoring  diagram/findings for description). There is mild left  ventricular hypertrophy. Left ventricular diastolic function could not be  evaluated. There is severe hypokinesis of the left ventricular, entire  anterior wall and anteroseptal wall.   2. Right ventricular systolic function is moderately reduced. The right  ventricular size is normal. Mildly increased right ventricular wall  thickness.   3. The mitral valve is degenerative. Trivial mitral valve regurgitation.   4. The aortic valve is tricuspid. Aortic valve regurgitation not well  assessed.  __________   2D echo 09/2020: 1. Left ventricular ejection fraction, by estimation, is 60 to 65%. The  left ventricle has normal function. The left ventricle has no regional  wall motion abnormalities. Left ventricular diastolic parameters are  indeterminate.   2. Right ventricular systolic function is normal. The right ventricular  size is normal. There is normal pulmonary artery systolic pressure.   3. Left atrial size was mildly dilated.   4. The mitral valve is normal in structure. Mild mitral valve  regurgitation. __________   Renal artery ultrasound 08/2019: Summary:  Largest Aortic Diameter: 1.6 cm     Renal:     Right: Normal size right kidney. Normal right Resisitive Index.         Normal cortical thickness of right kidney. No evidence of         right renal artery stenosis. RRV flow present.  Left:  Cyst(s) noted. 1-59% stenosis of the  left renal artery.         Normal size of left kidney. Normal left Resistive Index.         Normal cortical thickness of the left kidney. Cyst 1.62 cm x         1.71 cm.  Mesenteric:  Normal Celiac artery and Superior Mesenteric artery findings.  Patent IVC. __________   Carotid artery ultrasound 08/2015: 1 to 39% bilateral ICA stenosis __________   2D echo 01/2015: - Left ventricle: The cavity size was normal. Systolic function was    normal. The estimated ejection fraction was in the range of 60%  to 65%. Wall motion was normal; there were no regional wall    motion abnormalities. Left ventricular diastolic function    parameters were normal.  - Mitral valve: There was mild regurgitation.  - Left atrium: The atrium was mildly dilated.  - Right ventricle: Systolic function was normal.  - Pulmonary arteries: Systolic pressure was borderline elevated. PA    peak pressure: 37 mm Hg (S).   Impressions:   - Normal study. Rhythm is normal sinus.   EKG:  EKG is ordered today.  The EKG ordered today demonstrates ***  Recent Labs: 04/11/2022: ALT 21; B Natriuretic Peptide 347.5; BUN 28; Creatinine, Ser 1.03; Hemoglobin 13.8; Magnesium 2.4; Platelets 199; Potassium 4.4; Sodium 139 04/12/2022: TSH 1.422  Recent Lipid Panel    Component Value Date/Time   CHOL 127 07/12/2021 0844   CHOL 110 01/12/2016 0817   TRIG 59 07/12/2021 0844   HDL 70 07/12/2021 0844   HDL 59 01/12/2016 0817   CHOLHDL 1.8 07/12/2021 0844   LDLCALC 43 07/12/2021 0844    PHYSICAL EXAM:    VS:  There were no vitals taken for this visit.  BMI: There is no height or weight on file to calculate BMI.  Physical Exam  Wt Readings from Last 3 Encounters:  07/06/22 170 lb (77.1 kg)  06/13/22 171 lb (77.6 kg)  05/01/22 174 lb (78.9 kg)     ASSESSMENT & PLAN:   ***   {Are you ordering a CV Procedure (e.g. stress test, cath, DCCV, TEE, etc)?   Press F2        :[id number]}     Disposition: F/u with Dr.  Rockey Situ or an APP in ***.   Medication Adjustments/Labs and Tests Ordered: Current medicines are reviewed at length with the patient today.  Concerns regarding medicines are outlined above. Medication changes, Labs and Tests ordered today are summarized above and listed in the Patient Instructions accessible in Encounters.   Signed, [name], [name] 07/30/2022 10:33 AM     [facility] [address] [place] [address] [place], [place] [address] [phone]

## 2022-07-31 DIAGNOSIS — H3554 Dystrophies primarily involving the retinal pigment epithelium: Secondary | ICD-10-CM | POA: Diagnosis not present

## 2022-07-31 DIAGNOSIS — H16223 Keratoconjunctivitis sicca, not specified as Sjogren's, bilateral: Secondary | ICD-10-CM | POA: Diagnosis not present

## 2022-07-31 DIAGNOSIS — Z961 Presence of intraocular lens: Secondary | ICD-10-CM | POA: Diagnosis not present

## 2022-08-02 NOTE — Progress Notes (Signed)
Cardiology Office Note:    Date:  08/03/2022   ID:  Bonnie Rowland, DOB 1940-12-19, MRN [id number]  PCP:  Steele Sizer, MD   [place] Providers Cardiologist:  Ida Rogue, MD Electrophysiologist:  Vickie Epley, MD     Referring MD: Steele Sizer, MD   Chief Complaint  Patient presents with   12 month follow up     Patient c/o shortness of breath at times. Medications reviewed by the patient verbally.     History of Present Illness:    Bonnie Rowland is a 82 y.o. female with a hx of nonobstructive CAD, chronic combined systolic and diastolic heart failure, NICM, persistent A-fib (s/p DCCV x 2 2022, A-fib persisted, anticoagulated on Eliquis), hypertension, hyperlipidemia, pulmonary hypertension, DM2.  Seen in our office on 04/27/2022 by Cleotis Nipper, NP, at that time she had recently been evaluated in the ER with A-fib RVR (cardioverted in the ED x 2 with brief return to normal sinus rhythm but subsequently redeveloped atrial fib).   Last seen in our office on 06/13/2022 by Dr. Quentin Ore, at that time she was back in [place], and wanted to proceed with ablation for her atrial fibrillation. This is tentatively planned for 09/25/22.   She presents today for follow up of her CAD and HF. She is doing well from a cardiac perspective, she is walking 3000 steps per day, and trying to lose weight. Ablation planned for 09/05/22 and she is looking forward to this. She denies chest pain, palpitations, dyspnea, pnd, orthopnea, n, v, dizziness, syncope, edema, weight gain, or early satiety.   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; left shoulder blade   Cancer ([place]) 05/2014   basal cell removed left temple   Cataract 2003   Cellulitis and abscess of leg, except foot    Conjunctivitis unspecified    Dermatophytosis of nail    Diabetes mellitus    type II   Elevated troponin    Esophageal reflux    Hyperlipidemia    Hypertension    Osteoporosis 2016   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)    Renal artery stenosis (HCC)    Sprain of lumbar region    Thoracic or lumbosacral neuritis or radiculitis, unspecified    Urinary tract infection, site not specified     Past Surgical History:  Procedure Laterality Date   APPENDECTOMY     CARDIAC CATHETERIZATION     CARDIOVERSION N/A 11/30/2020   Procedure: CARDIOVERSION;  Surgeon: Minna Merritts, MD;  Location: [facility] ORS;  Service: Cardiovascular;  Laterality: N/A;   CARDIOVERSION N/A 01/20/2021   Procedure: CARDIOVERSION;  Surgeon: Minna Merritts, MD;  Location: [facility];  Service: Cardiovascular;  Laterality: N/A;   CARDIOVERSION N/A 01/30/2021   Procedure: CARDIOVERSION;  Surgeon: Wellington Hampshire, MD;  Location: [facility];  Service: Cardiovascular;  Laterality: N/A;   cataract surgery     COLONOSCOPY     COLONOSCOPY WITH PROPOFOL N/A 05/09/2016   Procedure: COLONOSCOPY WITH PROPOFOL;  Surgeon:  Robert Bellow, MD;  Location: [facility];  Service: Endoscopy;  Laterality: N/A;   DIAGNOSTIC MAMMOGRAM     EYE SURGERY Bilateral    Cataract Extraction   JOINT REPLACEMENT  2016   KNEE ARTHROPLASTY Right 03/30/2015   Procedure: COMPUTER ASSISTED TOTAL KNEE ARTHROPLASTY;  Surgeon: Dereck Leep, MD;  Location: [facility] ORS;  Service: Orthopedics;  Laterality: Right;   KNEE ARTHROSCOPY Right    KNEE CLOSED REDUCTION Right 05/23/2015   Procedure: CLOSED MANIPULATION KNEE;  Surgeon: Dereck Leep, MD;  Location: [facility] ORS;  Service: Orthopedics;  Laterality: Right;    RIGHT/LEFT HEART CATH AND CORONARY ANGIOGRAPHY N/A 01/25/2021   Procedure: RIGHT/LEFT HEART CATH AND CORONARY ANGIOGRAPHY;  Surgeon: Nelva Bush, MD;  Location: [facility];  Service: Cardiovascular;  Laterality: N/A;   TONSILLECTOMY      Current Medications: Current Meds  Medication Sig   acetaminophen (TYLENOL) 650 MG CR tablet Take 1,300 mg by mouth at bedtime.   ALFALFA PO Take 1 tablet by mouth daily.   amiodarone (PACERONE) 200 MG tablet Take 0.5 tablets (100 mg total) by mouth daily.   ascorbic Acid (VITAMIN C) 500 MG CPCR Take 500 mg by mouth daily.   B Complex-C (B-COMPLEX WITH VITAMIN C) tablet Take 1 tablet by mouth 2 (two) times daily.   BD PEN NEEDLE NANO U/F 32G X 4 MM MISC USE 4 TIMES DAILY (HUMALOG 3 TIMES DAILY AND LANTUS ONCE DAILY) OFFICE NOTIFIED 08/06/17   BLACK ELDERBERRY,BERRY-FLOWER, PO Take 15 mLs by mouth daily. Liquid   Calcium Carbonate (CALCIUM 600 PO) Take 600 mg by mouth daily.   carvedilol (COREG) 6.25 MG tablet Take 1 tablet (6.25 mg total) by mouth 2 (two) times daily with a meal.   cholecalciferol (VITAMIN D) 1000 UNITS tablet Take 1,000 Units by mouth daily. [place]   clobetasol ointment (TEMOVATE) 1.44 % APPLY 1 APPLICATION TOPICALLY 2 TIMES DAILY AS NEEDED (BUG BITES).   clotrimazole (GYNE-LOTRIMIN) 1 % vaginal cream Place 1 Applicatorful vaginally at bedtime.   Coenzyme Q10 (COQ10) 200 MG CAPS Take 200 mg by mouth daily.   Continuous Blood Gluc Sensor ([place]) MISC by Does not apply route.   Elastic Bandages & Supports (MEDICAL COMPRESSION STOCKINGS) MISC 1 each by Does not apply route daily.   ELIQUIS 5 MG TABS tablet TAKE 1 TABLET BY MOUTH TWICE A DAY   estradiol (ESTRACE VAGINAL) 0.1 MG/GM vaginal cream 1/2 gm once weekly using applicator, apply blueberry sized amount of cream using tip of finger to urethra twice weekly   Exenatide ER (BYDUREON BCISE) 2 MG/0.85ML AUIJ Inject 2 mg into the skin every Sunday.    FARXIGA 10 MG TABS tablet Take 10 mg by mouth daily.   furosemide (LASIX) 40 MG tablet TAKE 1 TABLET BY MOUTH TWICE A DAY   Glucosamine 500 MG TABS Take 500 mg by mouth daily.   HUMALOG KWIKPEN 100 UNIT/ML KiwkPen Inject 2-6 Units into the skin 3 (three) times daily before meals. If needed sliding scale   insulin glargine (LANTUS) 100 unit/mL SOPN Inject 12 Units into the skin at bedtime.   Krill Oil 1000 MG CAPS Take 1,000 mg by mouth daily.   Mag Aspart-Potassium Aspart (POTASSIUM & MAGNESIUM ASPARTAT PO) Take 1 tablet by mouth daily.   Multiple Vitamins-Minerals (PRESERVISION AREDS 2) CAPS Take 1 capsule by mouth 2 (two) times daily.   OVER THE COUNTER MEDICATION Place 1 application into both eyes See admin instructions. Blephadex eyelid  foam, apply to eyelids once daily   OVER THE COUNTER MEDICATION Take 1 tablet by mouth daily. Vita-Lea Gold otc supplement With out vitamin K ([place] products)   OVER THE COUNTER MEDICATION Take 1 tablet by mouth See admin instructions. Nutriferon otc supplement take Take 1 tablet on Sun, Tues, Thurs, Sat. Take 1 tablet twice daily on Mon, Wed, and Fri   Probiotic Product (PROBIOTIC PO) Take 1 capsule by mouth at bedtime.   PROLIA 60 MG/ML SOSY injection Inject 60 mg into the skin every 6 (six) months.   Propylene Glycol (SYSTANE BALANCE OP) Place 1 drop into both eyes daily as needed (dry eyes).   rosuvastatin (CRESTOR) 10 MG tablet TAKE 1 TABLET BY MOUTH EVERY DAY   sacubitril-valsartan (ENTRESTO) 49-51 MG Take 1 tablet by mouth 2 (two) times daily.   Sodium Chloride-Sodium Bicarb (NETI POT SINUS WASH NA) Place 1 Dose into the nose at bedtime.   tiZANidine (ZANAFLEX) 2 MG tablet TAKE 1-2 TABLETS (2-4 MG TOTAL) BY MOUTH AT BEDTIME.   Vitamin D, Ergocalciferol, (DRISDOL) 1.25 MG (50000 UT) CAPS capsule Take 50,000 Units by mouth every Saturday.     Allergies:   Cyclobenzaprine, Cheese, Ciprofloxacin hcl, Coconut (cocos nucifera), and Keflex [cephalexin]    Social History   Socioeconomic History   Marital status: Married    Spouse name: Emergency planning/management officer   Number of children: 1   Years of education: Not on file   Highest education level: Master's degree (e.g., MA, MS, MEng, MEd, MSW, MBA)  Occupational History   Occupation: Retired  Tobacco Use   Smoking status: Never   Smokeless tobacco: Never   Tobacco comments:    Smoking cessation materials not required  Vaping Use   Vaping Use: Never used  Substance and Sexual Activity   Alcohol use: Not Currently    Alcohol/week: 1.0 standard drink of alcohol    Types: 1 Glasses of wine per week    Comment: RARELY   Drug use: No   Sexual activity: Yes    Partners: Male    Birth control/protection: Post-menopausal  Other Topics Concern   Not on file  Social History Narrative   She is married , only has one son and he was diagnosed with colon cancer at age 1.    Social Determinants of Health   Financial Resource Strain: Low Risk  (04/26/2022)   Overall Financial Resource Strain (CARDIA)    Difficulty of Paying Living Expenses: Not hard at all  Food Insecurity: No Food Insecurity (04/26/2022)   Hunger Vital Sign    Worried About Running Out of Food in the Last Year: Never true    Ran Out of Food in the Last Year: Never true  Transportation Needs: No Transportation Needs (04/26/2022)   PRAPARE - Hydrologist (Medical): No    Lack of Transportation (Non-Medical): No  Physical Activity: Sufficiently Active (04/26/2022)   Exercise Vital Sign    Days of Exercise per Week: 7 days    Minutes of Exercise per Session: 30 min  Stress: No Stress Concern Present (04/26/2022)   [place]    Feeling of Stress : Not at all  Social Connections: [place] (04/26/2022)   Social Connection and Isolation Panel [NHANES]    Frequency of Communication with Friends and Family: More than three times a week     Frequency of Social Gatherings with Friends and Family: More than three times a week  Attends Religious Services: More than 4 times per year    Active Member of Clubs or Organizations: Yes    Attends Music therapist: More than 4 times per year    Marital Status: Married     Family History: The patient's family history includes Breast cancer (age of onset: 25) in her maternal aunt; Cancer in her father and mother; Colon cancer in her son; Diabetes in her mother; Hypertension in her father and mother; Kidney disease in her mother. There is no history of Kidney cancer or Bladder Cancer.  ROS:   Please see the history of present illness.    All other systems reviewed and are negative.  EKGs/Labs/Other Studies Reviewed:    The following studies were reviewed today:   EKG:  EKG is  ordered today.  The ekg ordered today demonstrates SR, HR 64 bpm  Recent Labs: 04/11/2022: ALT 21; B Natriuretic Peptide 347.5; BUN 28; Creatinine, Ser 1.03; Hemoglobin 13.8; Magnesium 2.4; Platelets 199; Potassium 4.4; Sodium 139 04/12/2022: TSH 1.422  Recent Lipid Panel    Component Value Date/Time   CHOL 127 07/12/2021 0844   CHOL 110 01/12/2016 0817   TRIG 59 07/12/2021 0844   HDL 70 07/12/2021 0844   HDL 59 01/12/2016 0817   CHOLHDL 1.8 07/12/2021 0844   LDLCALC 43 07/12/2021 0844     Risk Assessment/Calculations:    CHA2DS2-VASc Score = 7   This indicates a 11.2% annual risk of stroke. The patient's score is based upon: CHF History: 1 HTN History: 1 Diabetes History: 1 Stroke History: 0 Vascular Disease History: 1 Age Score: 2 Gender Score: 1     HYPERTENSION CONTROL Vitals:   08/03/22 1537 08/03/22 1546 08/03/22 1716  BP: (!) 160/60 (!) 160/60 (!) 150/70    The patient's blood pressure is elevated above target today.  In order to address the patient's elevated BP: Blood pressure will be monitored at home to determine if medication changes need to be made.             Physical Exam:    VS:  BP (!) 150/70   Pulse 64   Ht '5\' 3"'$  (1.6 m)   Wt 171 lb 4 oz (77.7 kg)   SpO2 97%   BMI 30.34 kg/m     Wt Readings from Last 3 Encounters:  08/03/22 171 lb 4 oz (77.7 kg)  07/06/22 170 lb (77.1 kg)  06/13/22 171 lb (77.6 kg)     GEN:  Well nourished, well developed in no acute distress HEENT: Normal NECK: No JVD; No carotid bruits LYMPHATICS: No lymphadenopathy CARDIAC: RRR, no murmurs, rubs, gallops RESPIRATORY:  Clear to auscultation without rales, wheezing or rhonchi  ABDOMEN: Soft, non-tender, non-distended MUSCULOSKELETAL:  No edema; No deformity  SKIN: Warm and dry NEUROLOGIC:  Alert and oriented x 3 PSYCHIATRIC:  Normal affect   ASSESSMENT:    1. Persistent atrial fibrillation ([place])   2. Essential hypertension   3. Coronary artery disease, non-occlusive   4. Chronic combined systolic and diastolic CHF, NYHA class 2 ([place])   5. Non-ischemic cardiomyopathy (HCC)    PLAN:    In order of problems listed above:  Atrial fibrillation - NSR today, planned ablation for 09/05/22, CHA2DS2-VASc Score = 7 [CHF History: 1, HTN History: 1, Diabetes History: 1, Stroke History: 0, Vascular Disease History: 1, Age Score: 2, Gender Score: 1].  Therefore, the patient's annual risk of stroke is 11.2 %.   Upcoming ablation on 09/05/22. Currently anticoagulated on  Eliquis (no indication for dose reduction). Continue Eliquis, amiodarone, coreg.  HTN - BP today is elevated, 160/60, 150/70. She would like to see if there is a white coat element to her BP readings. She will check her BP daily x 2 weeks and report consistent elevated readings > 130/70. If they are persistently elevated, increase her Entresto to 97/103 mg, which would also help with her HF. CAD/NICM - denies CP or acute decompensation. Continue coreg, rosuvastatin.  Chronic combined HF -  NYHA class 1, euvolemic. GDMT Lisabeth Register (will be changing to Jardiance soon d/t cost), coreg.        Disposition - check BP x 2 weeks, advise of any readings 130/70 or greater, if so, increase Entresto to 97/103. Return in 6 months.        Medication Adjustments/Labs and Tests Ordered: Current medicines are reviewed at length with the patient today.  Concerns regarding medicines are outlined above.  Orders Placed This Encounter  Procedures   EKG 12-Lead   No orders of the defined types were placed in this encounter.   Patient Instructions  Medication Instructions:  No changes at this time.   *If you need a refill on your cardiac medications before your next appointment, please call your pharmacy*   Lab Work: None  If you have labs (blood work) drawn today and your tests are completely normal, you will receive your results only by: [place] (if you have MyChart) OR A paper copy in the mail If you have any lab test that is abnormal or we need to change your treatment, we will call you to review the results.   Testing/Procedures: None   Follow-Up: At [facility], you and your health needs are our priority.  As part of our continuing mission to provide you with exceptional heart care, we have created designated Provider Care Teams.  These Care Teams include your primary Cardiologist (physician) and Advanced Practice Providers (APPs -  Physician Assistants and Nurse Practitioners) who all work together to provide you with the care you need, when you need it.  Your next appointment:   6 month(s)  Provider:   Ida Rogue, MD or [name], [name]       Signed, Trudi Ida, NP  08/03/2022 5:42 PM    [place]

## 2022-08-03 ENCOUNTER — Ambulatory Visit: Payer: Medicare Other | Attending: Physician Assistant | Admitting: Cardiology

## 2022-08-03 ENCOUNTER — Encounter: Payer: Self-pay | Admitting: Physician Assistant

## 2022-08-03 VITALS — BP 150/70 | HR 64 | Ht 63.0 in | Wt 171.2 lb

## 2022-08-03 DIAGNOSIS — I480 Paroxysmal atrial fibrillation: Secondary | ICD-10-CM

## 2022-08-03 DIAGNOSIS — I4819 Other persistent atrial fibrillation: Secondary | ICD-10-CM | POA: Diagnosis not present

## 2022-08-03 DIAGNOSIS — I5042 Chronic combined systolic (congestive) and diastolic (congestive) heart failure: Secondary | ICD-10-CM

## 2022-08-03 DIAGNOSIS — I2510 Atherosclerotic heart disease of native coronary artery without angina pectoris: Secondary | ICD-10-CM | POA: Diagnosis not present

## 2022-08-03 DIAGNOSIS — I428 Other cardiomyopathies: Secondary | ICD-10-CM | POA: Diagnosis not present

## 2022-08-03 DIAGNOSIS — I10 Essential (primary) hypertension: Secondary | ICD-10-CM | POA: Diagnosis not present

## 2022-08-03 NOTE — Patient Instructions (Signed)
Medication Instructions:  No changes at this time.   *If you need a refill on your cardiac medications before your next appointment, please call your pharmacy*   Lab Work: None  If you have labs (blood work) drawn today and your tests are completely normal, you will receive your results only by: [place] (if you have MyChart) OR A paper copy in the mail If you have any lab test that is abnormal or we need to change your treatment, we will call you to review the results.   Testing/Procedures: None   Follow-Up: At [facility], you and your health needs are our priority.  As part of our continuing mission to provide you with exceptional heart care, we have created designated Provider Care Teams.  These Care Teams include your primary Cardiologist (physician) and Advanced Practice Providers (APPs -  Physician Assistants and Nurse Practitioners) who all work together to provide you with the care you need, when you need it.  Your next appointment:   6 month(s)  Provider:   Ida Rogue, MD or [name], [name]

## 2022-08-12 ENCOUNTER — Other Ambulatory Visit: Payer: Self-pay | Admitting: Family Medicine

## 2022-08-12 DIAGNOSIS — M62830 Muscle spasm of back: Secondary | ICD-10-CM

## 2022-08-13 ENCOUNTER — Other Ambulatory Visit: Payer: Self-pay

## 2022-08-13 DIAGNOSIS — M62830 Muscle spasm of back: Secondary | ICD-10-CM

## 2022-08-15 NOTE — Progress Notes (Unsigned)
Name: Bonnie Rowland   MRN: [id number]    DOB: 12/08/1940   Date:08/16/2022       Progress Note  Subjective  Chief Complaint  Follow Up  HPI  Osteoporosis: she was on Evista for years but bone density got worse she was on Reclast but bone density did not improve, she is currently getting Prolia given by Dr. Honor Junes , reviewed last bone density done 08/2021 that showed some improvement.  She denies side effects of medication .  DM: seeing Endocrinologist - Dr. Manfred Shirts  up to date with eye exam. She had A1C done 10 /2023 at [facility] and it was at goal 6.4 %   She is on Bydureon, Farxiga ( but switching to [place] due to cost)  pre-meal insulin and basal insulin.  Denies polyphagia, polydipsia or polyuria Urine micro is up to date . Eye exam is up to date . She has an upcoming visit with Dr. Honor Junes   HTN: BP is at goal today it was high during visit to cardiologist last visit and Entresto dose was adjusted however she did not get new prescription. She denies chest pain, palpitation, only has SOB with moderate activity  Intertrigo: she takes prn  discussed risk of QT prolongation with amiodarone but she states CHF clinic told her it was okay . She also takes prn diflucan when she takes antibiotics also    Atherosclerosis of aorta/renal artery atherosclerosis  on statin therapy Last LDL was 43 ,  continue Crestor,we will recheck level today    Senile purpura: both arms and hands. She takes Eliquis, reassurance given    Obesity . She has DM, HTN, dyslipidemia, atherosclerosis of aorta. She continues to be active at [facility], taking Tai Chi classes, she also walks on a regular basis . 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 Rockey Situ, she had to have another cardioversion Oct 2023 - heart rate spiked the day that  she had steroid injection by ortho. Rate is controlled again, recently seen by Cardiologist and bp was high and a new rx of Entresto was adjusted but she is still taking the old dose and bp is at goal today, she is also taking Carvedilol and Amiodarone . She states she has mild lower extremity edema that is stable, she usually wears compression stocking hoses. Denies SOB with activity , she sleeps on her recliner not because of SOB but because of back problems  Back pain: she has been taking Tizanidine every night 2-4 mg prn at night for spasms, pain is described as intermittent, aching like and states exercising daily to keep back pain under control    Elevated TSH: taking Amiodarone, monitored by Dr. Honor Junes, unchanged   Change in bowel movements: she states it comes in waves, she states episodes can last a couple of weeks, triggered by eating and has loose stools and cannot eat before she goes out, she states episodes are random and can happen multiple times a year. No blood or mucus, associated with bloating but not pain.   Patient Active Problem List   Diagnosis Date Noted   Rapid atrial fibrillation ([place]) 04/12/2022   Obesity (BMI 30-39.9)    Contusion of foot 03/08/2022   Pain due to onychomycosis of toenails of both feet 09/07/2021   Chronic diastolic  CHF (congestive heart failure) (HCC)    AF (paroxysmal atrial fibrillation) ([place]) 01/23/2021   Controlled type 2 diabetes mellitus without complication, without long-term current use of insulin ([place]) 01/23/2021   Plantar fasciitis, bilateral 05/21/2019   Age-related osteoporosis without current pathological fracture 11/05/2017   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([place]) 07/09/2017   Atherosclerosis of abdominal aorta ([place]) 07/09/2017   Bilateral carotid bruits 08/24/2015   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   Chronic pain 01/10/2015   History of melanoma excision 01/10/2015   Spinal stenosis, lumbar region, 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    DDD (degenerative disc disease), lumbar 11/14/2014   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   Hyperlipemia 11/07/2009   Hypertension, benign 11/07/2009   Atherosclerosis of renal artery ([place]) 11/07/2009   Perennial allergic rhinitis 11/27/2007   Lumbosacral neuritis 01/17/2007    Past Surgical History:  Procedure Laterality Date   APPENDECTOMY     CARDIAC CATHETERIZATION     CARDIOVERSION N/A 11/30/2020   Procedure: CARDIOVERSION;  Surgeon: Minna Merritts, MD;  Location: [facility];  Service: Cardiovascular;  Laterality: N/A;   CARDIOVERSION N/A 01/20/2021   Procedure: CARDIOVERSION;  Surgeon: Minna Merritts, MD;  Location: [place] [facility];  Service: Cardiovascular;  Laterality: N/A;   CARDIOVERSION N/A 01/30/2021   Procedure: CARDIOVERSION;  Surgeon: Wellington Hampshire, MD;  Location: [facility];  Service: Cardiovascular;  Laterality: N/A;   cataract surgery     COLONOSCOPY     COLONOSCOPY WITH PROPOFOL N/A 05/09/2016   Procedure: COLONOSCOPY WITH PROPOFOL;  Surgeon: Robert Bellow, MD;  Location: [facility];  Service: Endoscopy;  Laterality: N/A;   DIAGNOSTIC MAMMOGRAM     EYE SURGERY Bilateral    Cataract Extraction   JOINT REPLACEMENT  2016   KNEE ARTHROPLASTY Right 03/30/2015   Procedure: COMPUTER ASSISTED TOTAL KNEE ARTHROPLASTY;  Surgeon: Dereck Leep, MD;  Location: [facility];  Service: Orthopedics;  Laterality: Right;   KNEE ARTHROSCOPY Right    KNEE CLOSED REDUCTION Right 05/23/2015   Procedure: CLOSED MANIPULATION KNEE;  Surgeon: Dereck Leep, MD;  Location: [facility];  Service: Orthopedics;  Laterality: Right;   RIGHT/LEFT HEART CATH AND CORONARY ANGIOGRAPHY N/A 01/25/2021   Procedure: RIGHT/LEFT HEART CATH AND CORONARY ANGIOGRAPHY;  Surgeon: Nelva Bush, MD;  Location: [place] [facility];  Service: Cardiovascular;  Laterality: N/A;   TONSILLECTOMY      Family History  Problem Relation Age of Onset   Diabetes Mother    Hypertension Mother    Cancer Mother     Kidney disease Mother    Hypertension Father    Cancer Father        Prostate   Breast cancer Maternal Aunt 13   Colon cancer Son    Kidney cancer Neg Hx    Bladder Cancer Neg Hx     Social History   Tobacco Use   Smoking status: Never   Smokeless tobacco: Never   Tobacco comments:    Smoking cessation materials not required  Substance Use Topics   Alcohol use: Not Currently    Alcohol/week: 1.0 standard drink of alcohol    Types: 1 Glasses of wine per week    Comment: RARELY     Current Outpatient Medications:    acetaminophen (TYLENOL) 650 MG CR tablet, Take 1,300 mg by mouth at bedtime., Disp: , Rfl:    ALFALFA PO, Take 1 tablet by mouth daily., Disp: , Rfl:    amiodarone (PACERONE) 200 MG tablet, Take 0.5 tablets (100 mg total) by mouth  daily., Disp: 90 tablet, Rfl: 3   ascorbic Acid (VITAMIN C) 500 MG CPCR, Take 500 mg by mouth daily., Disp: , Rfl:    B Complex-C (B-COMPLEX WITH VITAMIN C) tablet, Take 1 tablet by mouth 2 (two) times daily., Disp: , Rfl:    BD PEN NEEDLE NANO U/F 32G X 4 MM MISC, USE 4 TIMES DAILY (HUMALOG 3 TIMES DAILY AND LANTUS ONCE DAILY) OFFICE NOTIFIED 08/06/17, Disp: 90 each, Rfl: 1   BLACK ELDERBERRY,BERRY-FLOWER, PO, Take 15 mLs by mouth daily. Liquid, Disp: , Rfl:    Calcium Carbonate (CALCIUM 600 PO), Take 600 mg by mouth daily., Disp: , Rfl:    carvedilol (COREG) 6.25 MG tablet, Take 1 tablet (6.25 mg total) by mouth 2 (two) times daily with a meal., Disp: 180 tablet, Rfl: 0   cholecalciferol (VITAMIN D) 1000 UNITS tablet, Take 1,000 Units by mouth daily. Shaklee, Disp: , Rfl:    clobetasol ointment (TEMOVATE) AB-123456789 %, APPLY 1 APPLICATION TOPICALLY 2 TIMES DAILY AS NEEDED (BUG BITES)., Disp: 30 g, Rfl: 0   clotrimazole (GYNE-LOTRIMIN) 1 % vaginal cream, Place 1 Applicatorful vaginally at bedtime., Disp: 45 g, Rfl: 0   Coenzyme Q10 (COQ10) 200 MG CAPS, Take 200 mg by mouth daily., Disp: , Rfl:    Continuous Blood Gluc Sensor ([place]) MISC, by Does not apply route., Disp: , Rfl:    Elastic Bandages & Supports ([place]) MISC, 1 each by Does not apply route daily., Disp: 2 each, Rfl: 5   ELIQUIS 5 MG TABS tablet, TAKE 1 TABLET BY MOUTH TWICE A DAY, Disp: 180 tablet, Rfl: 1   estradiol (ESTRACE VAGINAL) 0.1 MG/GM vaginal cream, 1/2 gm once weekly using applicator, apply blueberry sized amount of cream using tip of finger to urethra twice weekly, Disp: 30 g, Rfl: 3   Exenatide ER (BYDUREON BCISE) 2 MG/0.85ML AUIJ, Inject 2 mg into the skin every Sunday., Disp: , Rfl:    furosemide (LASIX) 40 MG tablet, TAKE 1 TABLET BY MOUTH TWICE A DAY, Disp: 180 tablet, Rfl: 3   Glucosamine 500 MG TABS, Take 500 mg by mouth daily., Disp: , Rfl:    HUMALOG KWIKPEN 100 UNIT/ML KiwkPen, Inject 2-6 Units into the skin 3 (three) times daily before meals. If needed sliding scale, Disp: , Rfl: 3   insulin glargine (LANTUS) 100 unit/mL SOPN, Inject 12 Units into the skin at bedtime., Disp: , Rfl:    Krill Oil 1000 MG CAPS, Take 1,000 mg by mouth daily., Disp: , Rfl:    Mag Aspart-Potassium Aspart (POTASSIUM & MAGNESIUM ASPARTAT PO), Take 1 tablet by mouth daily., Disp: , Rfl:    Multiple Vitamins-Minerals (PRESERVISION AREDS 2) CAPS, Take 1 capsule by mouth 2 (two) times daily., Disp: , Rfl:    OVER THE COUNTER MEDICATION, Place 1 application into both eyes See admin instructions. Blephadex eyelid foam, apply to eyelids once daily, Disp: , Rfl:    OVER THE COUNTER MEDICATION, Take 1 tablet by mouth daily. Vita-Lea Gold otc supplement With out vitamin K (Shaklee products), Disp: , Rfl:    OVER THE COUNTER MEDICATION, Take 1 tablet by mouth See admin instructions. Nutriferon otc supplement take Take 1 tablet on Sun, Tues, Thurs, Sat. Take 1 tablet twice daily on Mon, Wed, and Fri, Disp: , Rfl:    Probiotic Product (PROBIOTIC PO), Take 1 capsule by mouth at bedtime., Disp: , Rfl:    PROLIA 60 MG/ML SOSY injection, Inject 60 mg  into  the skin every 6 (six) months., Disp: , Rfl:    Propylene Glycol (SYSTANE BALANCE OP), Place 1 drop into both eyes daily as needed (dry eyes)., Disp: , Rfl:    rosuvastatin (CRESTOR) 10 MG tablet, TAKE 1 TABLET BY MOUTH EVERY DAY, Disp: 90 tablet, Rfl: 1   sacubitril-valsartan (ENTRESTO) 49-51 MG, Take 1 tablet by mouth 2 (two) times daily., Disp: 180 tablet, Rfl: 3   Sodium Chloride-Sodium Bicarb (NETI POT SINUS WASH NA), Place 1 Dose into the nose at bedtime., Disp: , Rfl:    tiZANidine (ZANAFLEX) 2 MG tablet, TAKE 1-2 TABLETS (2-4 MG TOTAL) BY MOUTH AT BEDTIME., Disp: 110 tablet, Rfl: 0   Vitamin D, Ergocalciferol, (DRISDOL) 1.25 MG (50000 UT) CAPS capsule, Take 50,000 Units by mouth every Saturday., Disp: , Rfl:    FARXIGA 10 MG TABS tablet, Take 10 mg by mouth daily. (Patient not taking: Reported on 08/16/2022), Disp: , Rfl:   Allergies  Allergen Reactions   Cyclobenzaprine Hypertension   Cheese Other (See Comments)    bloating   Ciprofloxacin Hcl Other (See Comments)    Muscle pain   Coconut (Cocos Nucifera)     Upset stomach   Keflex [Cephalexin] Other (See Comments)    Patient prefers not to take this medication due to the side effects, caused tendon issues    I personally reviewed active problem list, medication list, allergies, family history, social history, health maintenance with the patient/caregiver today.   ROS  Constitutional: Negative for fever , positive for weight change.  Respiratory: Negative for cough and shortness of breath.   Cardiovascular: Negative for chest pain or palpitations.  Gastrointestinal: Negative for abdominal pain, no bowel changes.  Musculoskeletal: positive  for gait problem, uses a cane when not at home, but no joint swelling.  Skin: Negative for rash.  Neurological: Negative for dizziness or headache.  No other specific complaints in a complete review of systems (except as listed in HPI above).   Objective  Vitals:   08/16/22 0834   BP: 130/68  Pulse: 71  Resp: 16  SpO2: 96%  Weight: 171 lb (77.6 kg)  Height: 5' 3"$  (1.6 m)    Body mass index is 30.29 kg/m.  Physical Exam  Constitutional: Patient appears well-developed and well-nourished. Obese  No distress.  HEENT: head atraumatic, normocephalic, pupils equal and reactive to light, neck supple Cardiovascular: Normal rate, regular rhythm and normal heart sounds.  No murmur heard. No BLE edema. Pulmonary/Chest: Effort normal and breath sounds normal. No respiratory distress. Abdominal: Soft.  There is no tenderness. Psychiatric: Patient has a normal mood and affect. behavior is normal. Judgment and thought content normal.   Recent Results (from the past 2160 hour(s))  Bladder Scan (Post Void Residual) in office     Status: None   Collection Time: 07/06/22 11:28 AM  Result Value Ref Range   Scan Result 6m   Urinalysis, Complete     Status: Abnormal   Collection Time: 07/06/22 11:52 AM  Result Value Ref Range   Specific Gravity, UA 1.015 1.005 - 1.030   pH, UA 5.5 5.0 - 7.5   Color, UA Yellow Yellow   Appearance Ur Hazy (A) Clear   Leukocytes,UA Trace (A) Negative   Protein,UA Negative Negative/Trace   Glucose, UA 2+ (A) Negative   Ketones, UA Negative Negative   RBC, UA Negative Negative   Bilirubin, UA Negative Negative   Urobilinogen, Ur 0.2 0.2 - 1.0 mg/dL   Nitrite, UA Negative Negative  Microscopic Examination See below:   Microscopic Examination     Status: Abnormal   Collection Time: 07/06/22 11:52 AM   Urine  Result Value Ref Range   WBC, UA 11-30 (A) 0 - 5 /hpf   RBC, Urine 0-2 0 - 2 /hpf   Epithelial Cells (non renal) >10 (A) 0 - 10 /hpf   Bacteria, UA Moderate (A) None seen/Few   Yeast, UA Present (A) None seen  CULTURE, URINE COMPREHENSIVE     Status: None   Collection Time: 07/06/22  2:45 PM   Specimen: Urine   UR  Result Value Ref Range   Urine Culture, Comprehensive Final report    Organism ID, Bacteria Comment      Comment: Mixed urogenital flora 25,000-50,000 colony forming units per mL     PHQ2/9:    08/16/2022    8:33 AM 04/26/2022    1:59 PM 02/12/2022    9:11 AM 01/09/2022    8:02 AM 08/29/2021    9:46 AM  Depression screen PHQ 2/9  Decreased Interest 0 0 0 0 0  Down, Depressed, Hopeless 0 0 0 0 0  PHQ - 2 Score 0 0 0 0 0  Altered sleeping 0  0 0   Tired, decreased energy 0  0 0   Change in appetite 0  0 0   Feeling bad or failure about yourself  0  0 0   Trouble concentrating 0  0 0   Moving slowly or fidgety/restless 0  0 0   Suicidal thoughts 0  0 0   PHQ-9 Score 0  0 0     phq 9 is negative   Fall Risk:    08/16/2022    8:33 AM 04/26/2022    1:59 PM 02/12/2022    9:11 AM 01/09/2022    8:02 AM 12/27/2021    9:52 AM  Fall Risk   Falls in the past year? 0 0 0 0 0  Number falls in past yr: 0 0 0 0 0  Injury with Fall? 0 0 0 0 0  Risk for fall due to : No Fall Risks No Fall Risks No Fall Risks No Fall Risks   Follow up Falls prevention discussed Falls evaluation completed Falls prevention discussed Falls prevention discussed Falls evaluation completed      Functional Status Survey: Is the patient deaf or have difficulty hearing?: Yes Does the patient have difficulty seeing, even when wearing glasses/contacts?: Yes Does the patient have difficulty concentrating, remembering, or making decisions?: No Does the patient have difficulty walking or climbing stairs?: Yes Does the patient have difficulty dressing or bathing?: No Does the patient have difficulty doing errands alone such as visiting a doctor's office or shopping?: No    Assessment & Plan  1. AF (paroxysmal atrial fibrillation) ([place])  Rate controlled   2. Chronic combined systolic and diastolic congestive heart failure ([place])  Doing well   3. 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([place])  Reassurance given   4. Atherosclerosis of abdominal aorta (HCC)  - Lipid panel  5. Atherosclerosis of renal artery (HCC)  Continue Crestor    6. Dyslipidemia associated with type 2 diabetes mellitus ([place])  On statin therapy   7. Hypertension associated with type 2 diabetes mellitus (HCC)  - COMPLETE METABOLIC PANEL WITH GFR - CBC with Differential/Platelet  8. Non-ischemic cardiomyopathy ([name])  Monitored by cardiologist   9. Osteoporosis, post-menopausal  - VITAMIN D 25 Hydroxy (Vit-D Deficiency, Fractures)

## 2022-08-16 ENCOUNTER — Ambulatory Visit (INDEPENDENT_AMBULATORY_CARE_PROVIDER_SITE_OTHER): Payer: Medicare Other | Admitting: Family Medicine

## 2022-08-16 ENCOUNTER — Encounter: Payer: Self-pay | Admitting: Family Medicine

## 2022-08-16 VITALS — BP 130/68 | HR 71 | Resp 16 | Ht 63.0 in | Wt 171.0 lb

## 2022-08-16 DIAGNOSIS — M810 Age-related osteoporosis without current pathological fracture: Secondary | ICD-10-CM

## 2022-08-16 DIAGNOSIS — I700 Atherosclerosis of aorta: Secondary | ICD-10-CM

## 2022-08-16 DIAGNOSIS — I480 Paroxysmal atrial fibrillation: Secondary | ICD-10-CM | POA: Diagnosis not present

## 2022-08-16 DIAGNOSIS — D692 Other nonthrombocytopenic purpura: Secondary | ICD-10-CM

## 2022-08-16 DIAGNOSIS — I428 Other cardiomyopathies: Secondary | ICD-10-CM | POA: Diagnosis not present

## 2022-08-16 DIAGNOSIS — E785 Hyperlipidemia, unspecified: Secondary | ICD-10-CM | POA: Diagnosis not present

## 2022-08-16 DIAGNOSIS — E1159 Type 2 diabetes mellitus with other circulatory complications: Secondary | ICD-10-CM | POA: Diagnosis not present

## 2022-08-16 DIAGNOSIS — R194 Change in bowel habit: Secondary | ICD-10-CM | POA: Diagnosis not present

## 2022-08-16 DIAGNOSIS — I5042 Chronic combined systolic (congestive) and diastolic (congestive) heart failure: Secondary | ICD-10-CM | POA: Diagnosis not present

## 2022-08-16 DIAGNOSIS — I152 Hypertension secondary to endocrine disorders: Secondary | ICD-10-CM | POA: Diagnosis not present

## 2022-08-16 DIAGNOSIS — I701 Atherosclerosis of renal artery: Secondary | ICD-10-CM

## 2022-08-16 DIAGNOSIS — E1169 Type 2 diabetes mellitus with other specified complication: Secondary | ICD-10-CM | POA: Diagnosis not present

## 2022-08-17 LAB — CBC WITH DIFFERENTIAL/PLATELET
Absolute Monocytes: 528 cells/uL (ref 200–950)
Basophils Absolute: 41 cells/uL (ref 0–200)
Basophils Relative: 0.7 %
Eosinophils Absolute: 81 cells/uL (ref 15–500)
Eosinophils Relative: 1.4 %
HCT: 41.6 % (ref 35.0–45.0)
Hemoglobin: 14.1 g/dL (ref 11.7–15.5)
Lymphs Abs: 1224 cells/uL (ref 850–3900)
MCH: 33 pg (ref 27.0–33.0)
MCHC: 33.9 g/dL (ref 32.0–36.0)
MCV: 97.4 fL (ref 80.0–100.0)
MPV: 10.7 fL (ref 7.5–12.5)
Monocytes Relative: 9.1 %
Neutro Abs: 3927 cells/uL (ref 1500–7800)
Neutrophils Relative %: 67.7 %
Platelets: 179 10*3/uL (ref 140–400)
RBC: 4.27 10*6/uL (ref 3.80–5.10)
RDW: 11.7 % (ref 11.0–15.0)
Total Lymphocyte: 21.1 %
WBC: 5.8 10*3/uL (ref 3.8–10.8)

## 2022-08-17 LAB — COMPLETE METABOLIC PANEL WITH GFR
AG Ratio: 1.7 (calc) (ref 1.0–2.5)
ALT: 18 U/L (ref 6–29)
AST: 25 U/L (ref 10–35)
Albumin: 4.2 g/dL (ref 3.6–5.1)
Alkaline phosphatase (APISO): 58 U/L (ref 37–153)
BUN: 15 mg/dL (ref 7–25)
CO2: 30 mmol/L (ref 20–32)
Calcium: 9.2 mg/dL (ref 8.6–10.4)
Chloride: 102 mmol/L (ref 98–110)
Creat: 0.86 mg/dL (ref 0.60–1.00)
Globulin: 2.5 g/dL (calc) (ref 1.9–3.7)
Glucose, Bld: 136 mg/dL — ABNORMAL HIGH (ref 65–99)
Potassium: 3.8 mmol/L (ref 3.5–5.3)
Sodium: 142 mmol/L (ref 135–146)
Total Bilirubin: 0.8 mg/dL (ref 0.2–1.2)
Total Protein: 6.7 g/dL (ref 6.1–8.1)
eGFR: 69 mL/min/{1.73_m2} (ref >=60)

## 2022-08-17 LAB — LIPID PANEL
Cholesterol: 144 mg/dL (ref <200)
HDL: 79 mg/dL (ref >=50)
LDL Cholesterol (Calc): 50 mg/dL (calc)
Non-HDL Cholesterol (Calc): 65 mg/dL (calc) (ref <130)
Total CHOL/HDL Ratio: 1.8 (calc) (ref <5.0)
Triglycerides: 73 mg/dL (ref <150)

## 2022-08-17 LAB — VITAMIN D 25 HYDROXY (VIT D DEFICIENCY, FRACTURES): Vit D, 25-Hydroxy: 93 ng/mL (ref 30–100)

## 2022-08-22 DIAGNOSIS — D2272 Melanocytic nevi of left lower limb, including hip: Secondary | ICD-10-CM | POA: Diagnosis not present

## 2022-08-22 DIAGNOSIS — D2261 Melanocytic nevi of right upper limb, including shoulder: Secondary | ICD-10-CM | POA: Diagnosis not present

## 2022-08-22 DIAGNOSIS — Z85820 Personal history of malignant melanoma of skin: Secondary | ICD-10-CM | POA: Diagnosis not present

## 2022-08-22 DIAGNOSIS — D225 Melanocytic nevi of trunk: Secondary | ICD-10-CM | POA: Diagnosis not present

## 2022-08-22 DIAGNOSIS — L570 Actinic keratosis: Secondary | ICD-10-CM | POA: Diagnosis not present

## 2022-08-22 DIAGNOSIS — L308 Other specified dermatitis: Secondary | ICD-10-CM | POA: Diagnosis not present

## 2022-08-22 DIAGNOSIS — Z85828 Personal history of other malignant neoplasm of skin: Secondary | ICD-10-CM | POA: Diagnosis not present

## 2022-08-23 DIAGNOSIS — E1165 Type 2 diabetes mellitus with hyperglycemia: Secondary | ICD-10-CM | POA: Diagnosis not present

## 2022-08-23 DIAGNOSIS — E785 Hyperlipidemia, unspecified: Secondary | ICD-10-CM | POA: Diagnosis not present

## 2022-08-23 DIAGNOSIS — E1169 Type 2 diabetes mellitus with other specified complication: Secondary | ICD-10-CM | POA: Diagnosis not present

## 2022-08-23 DIAGNOSIS — M810 Age-related osteoporosis without current pathological fracture: Secondary | ICD-10-CM | POA: Diagnosis not present

## 2022-08-23 DIAGNOSIS — Z794 Long term (current) use of insulin: Secondary | ICD-10-CM | POA: Diagnosis not present

## 2022-08-23 DIAGNOSIS — I152 Hypertension secondary to endocrine disorders: Secondary | ICD-10-CM | POA: Diagnosis not present

## 2022-08-23 DIAGNOSIS — E1159 Type 2 diabetes mellitus with other circulatory complications: Secondary | ICD-10-CM | POA: Diagnosis not present

## 2022-08-27 DIAGNOSIS — M5416 Radiculopathy, lumbar region: Secondary | ICD-10-CM | POA: Diagnosis not present

## 2022-08-27 DIAGNOSIS — M5033 Other cervical disc degeneration, cervicothoracic region: Secondary | ICD-10-CM | POA: Diagnosis not present

## 2022-08-27 DIAGNOSIS — M9901 Segmental and somatic dysfunction of cervical region: Secondary | ICD-10-CM | POA: Diagnosis not present

## 2022-08-27 DIAGNOSIS — M9903 Segmental and somatic dysfunction of lumbar region: Secondary | ICD-10-CM | POA: Diagnosis not present

## 2022-09-05 ENCOUNTER — Telehealth: Payer: Self-pay

## 2022-09-05 NOTE — Progress Notes (Signed)
Care Management & Coordination Services Pharmacy Team Pharmacy Assistant   Name: Bonnie Rowland  MRN: [id number] DOB: [birth year]  Reason for Encounter: Diabetes  Contacted patient to discuss diabetes disease state. {US HC Outreach:28874}  Chart Updates:  Recent office visits:  08/16/2022 Steele Sizer, MD (PCP Office Visit) for follow-up- No medication changes noted, Lab orders placed, Referral to Gastroenterology placed, Patient to follow-up in 6 months  Recent consult visits:  08/23/2022 [name], MD (Endocrinology) for Follow-up- Per providers noted when you run out of the Bydureon, switch to a newer once a week shot called Mounjaro 10, When you switch to the [facility], see if you can go without the humalog. No orders placed, No follow-up noted  08/03/2022 Venia Carbon, NP (Cardiology) for 1 year follow-up- No medication changes noted, EKG order placed, BP 150/70, Patient to follow-up in 6 months  Hospital visits:  None in previous 6 months  Medications: Outpatient Encounter Medications as of 09/05/2022  Medication Sig   acetaminophen (TYLENOL) 650 MG CR tablet Take 1,300 mg by mouth at bedtime.   ALFALFA PO Take 1 tablet by mouth daily.   amiodarone (PACERONE) 200 MG tablet Take 0.5 tablets (100 mg total) by mouth daily.   ascorbic Acid (VITAMIN C) 500 MG CPCR Take 500 mg by mouth daily.   B Complex-C (B-COMPLEX WITH VITAMIN C) tablet Take 1 tablet by mouth 2 (two) times daily.   BD PEN NEEDLE NANO U/F 32G X 4 MM MISC USE 4 TIMES DAILY (HUMALOG 3 TIMES DAILY AND LANTUS ONCE DAILY) OFFICE NOTIFIED 08/06/17   BLACK ELDERBERRY,BERRY-FLOWER, PO Take 15 mLs by mouth daily. Liquid   Calcium Carbonate (CALCIUM 600 PO) Take 600 mg by mouth daily.   carvedilol (COREG) 6.25 MG tablet Take 1 tablet (6.25 mg total) by mouth 2 (two) times daily with a meal.   cholecalciferol (VITAMIN D) 1000 UNITS tablet Take 1,000 Units by mouth daily. Shaklee   clobetasol ointment  (TEMOVATE) [id number] % APPLY 1 APPLICATION TOPICALLY 2 TIMES DAILY AS NEEDED (BUG BITES).   clotrimazole (GYNE-LOTRIMIN) 1 % vaginal cream Place 1 Applicatorful vaginally at bedtime.   Coenzyme Q10 (COQ10) 200 MG CAPS Take 200 mg by mouth daily.   Continuous Blood Gluc Sensor ([place]) MISC by Does not apply route.   Elastic Bandages & Supports (MEDICAL COMPRESSION STOCKINGS) MISC 1 each by Does not apply route daily.   ELIQUIS 5 MG TABS tablet TAKE 1 TABLET BY MOUTH TWICE A DAY   estradiol (ESTRACE VAGINAL) 0.1 MG/GM vaginal cream 1/2 gm once weekly using applicator, apply blueberry sized amount of cream using tip of finger to urethra twice weekly   Exenatide ER (BYDUREON BCISE) 2 MG/0.85ML AUIJ Inject 2 mg into the skin every Sunday.   FARXIGA 10 MG TABS tablet Take 10 mg by mouth daily. (Patient not taking: Reported on 08/16/2022)   furosemide (LASIX) 40 MG tablet TAKE 1 TABLET BY MOUTH TWICE A DAY   Glucosamine 500 MG TABS Take 500 mg by mouth daily.   HUMALOG KWIKPEN 100 UNIT/ML KiwkPen Inject 2-6 Units into the skin 3 (three) times daily before meals. If needed sliding scale   insulin glargine (LANTUS) 100 unit/mL SOPN Inject 12 Units into the skin at bedtime.   Krill Oil 1000 MG CAPS Take 1,000 mg by mouth daily.   Mag Aspart-Potassium Aspart (POTASSIUM & MAGNESIUM ASPARTAT PO) Take 1 tablet by mouth daily.   Multiple Vitamins-Minerals (PRESERVISION AREDS 2) CAPS Take 1 capsule by  mouth 2 (two) times daily.   OVER THE COUNTER MEDICATION Place 1 application into both eyes See admin instructions. Blephadex eyelid foam, apply to eyelids once daily   OVER THE COUNTER MEDICATION Take 1 tablet by mouth daily. Vita-Lea Gold otc supplement With out vitamin K (Shaklee products)   OVER THE COUNTER MEDICATION Take 1 tablet by mouth See admin instructions. Nutriferon otc supplement take Take 1 tablet on Sun, Tues, Thurs, Sat. Take 1 tablet twice daily on Mon, Wed, and Fri   Probiotic  Product (PROBIOTIC PO) Take 1 capsule by mouth at bedtime.   PROLIA 60 MG/ML SOSY injection Inject 60 mg into the skin every 6 (six) months.   Propylene Glycol (SYSTANE BALANCE OP) Place 1 drop into both eyes daily as needed (dry eyes).   rosuvastatin (CRESTOR) 10 MG tablet TAKE 1 TABLET BY MOUTH EVERY DAY   sacubitril-valsartan (ENTRESTO) 49-51 MG Take 1 tablet by mouth 2 (two) times daily.   Sodium Chloride-Sodium Bicarb (NETI POT SINUS WASH NA) Place 1 Dose into the nose at bedtime.   tiZANidine (ZANAFLEX) 2 MG tablet TAKE 1-2 TABLETS (2-4 MG TOTAL) BY MOUTH AT BEDTIME.   Vitamin D, Ergocalciferol, (DRISDOL) 1.25 MG (50000 UT) CAPS capsule Take 50,000 Units by mouth every Saturday.   [DISCONTINUED] doxycycline (VIBRA-TABS) 100 MG tablet Take 1 tablet (100 mg total) by mouth 2 (two) times daily.   No facility-administered encounter medications on file as of 09/05/2022.    Recent Relevant Labs: Lab Results  Component Value Date/Time   HGBA1C 6.4 (H) 04/12/2022 01:01 PM   HGBA1C 6.7 09/21/2021 12:00 AM   HGBA1C 7.1 03/23/2021 12:00 AM   MICROALBUR <0.2 01/09/2022 08:49 AM   MICROALBUR Negative 08/21/2019 12:00 AM   MICROALBUR 0 02/18/2017 09:01 AM   MICROALBUR 50 04/23/2016 10:37 AM    Kidney Function Lab Results  Component Value Date/Time   CREATININE 0.86 08/16/2022 09:42 AM   CREATININE 1.03 (H) 04/11/2022 11:47 PM   CREATININE 0.95 01/09/2022 08:49 AM   GFRNONAA 55 (L) 04/11/2022 11:47 PM   GFRNONAA 88 04/11/2018 10:32 AM   GFRAA 102 04/11/2018 10:32 AM   Current antihyperglycemic regimen:  ***   Patient verbally confirms she is taking the above medications as directed. {yes/no:20286}  What diet changes have been made to improve diabetes control?  What recent interventions/DTPs have been made to improve glycemic control:  Patient was discontinued from [place] and Started on [place] due to the cost of the Farxiga. When you switch to the [facility], see if you can go without  the humalog.   Have there been any recent hospitalizations or ED visits since last visit with PharmD? No  Patient {reports/denies:24182} hypoglycemic symptoms, including {Hypoglycemic Symptoms:3049003}  Patient {reports/denies:24182} hyperglycemic symptoms, including {symptoms; hyperglycemia:17903}  How often are you checking your blood sugar? {BG Testing frequency:23922}  What are your blood sugars ranging?  Fasting: *** Before meals: *** After meals: *** Bedtime: ***  During the week, how often does your blood glucose drop below 70? {LowBGfrequency:24142}  Are you checking your feet daily/regularly? {yes/no:20286}  Adherence Review: Is the patient currently on a STATIN medication? {yes/no:20286} Is the patient currently on ACE/ARB medication? {yes/no:20286} Does the patient have >5 day gap between last estimated fill dates? {yes/no:20286}  Star Rating Drugs:  Rosuvastatin 10 mg last filled on 06/20/2022 for a 90-Day supply with [facility]   Care Gaps: Annual wellness visit in last year? Yes Last eye exam / retinopathy screening: 01/31/2022 Last diabetic foot exam:  07/19/2022   Lynann Bologna, CPA/CMA Clinical Pharmacist Assistant Phone: [phone]

## 2022-09-10 DIAGNOSIS — M5033 Other cervical disc degeneration, cervicothoracic region: Secondary | ICD-10-CM | POA: Diagnosis not present

## 2022-09-10 DIAGNOSIS — M5416 Radiculopathy, lumbar region: Secondary | ICD-10-CM | POA: Diagnosis not present

## 2022-09-10 DIAGNOSIS — M9901 Segmental and somatic dysfunction of cervical region: Secondary | ICD-10-CM | POA: Diagnosis not present

## 2022-09-10 DIAGNOSIS — M9903 Segmental and somatic dysfunction of lumbar region: Secondary | ICD-10-CM | POA: Diagnosis not present

## 2022-09-13 ENCOUNTER — Other Ambulatory Visit: Payer: Self-pay | Admitting: Urology

## 2022-09-13 DIAGNOSIS — N952 Postmenopausal atrophic vaginitis: Secondary | ICD-10-CM

## 2022-09-15 ENCOUNTER — Other Ambulatory Visit: Payer: Self-pay | Admitting: Family Medicine

## 2022-09-15 ENCOUNTER — Other Ambulatory Visit: Payer: Self-pay | Admitting: Cardiovascular Disease

## 2022-09-15 DIAGNOSIS — I480 Paroxysmal atrial fibrillation: Secondary | ICD-10-CM

## 2022-09-15 DIAGNOSIS — I701 Atherosclerosis of renal artery: Secondary | ICD-10-CM

## 2022-09-15 DIAGNOSIS — E1169 Type 2 diabetes mellitus with other specified complication: Secondary | ICD-10-CM

## 2022-09-17 ENCOUNTER — Other Ambulatory Visit
Admission: RE | Admit: 2022-09-17 | Discharge: 2022-09-17 | Disposition: A | Discharge status: Home / Self Care | Payer: Medicare Other | Attending: Cardiology | Admitting: Cardiology

## 2022-09-17 ENCOUNTER — Telehealth (HOSPITAL_COMMUNITY): Payer: Self-pay | Admitting: Emergency Medicine

## 2022-09-17 DIAGNOSIS — I5042 Chronic combined systolic (congestive) and diastolic (congestive) heart failure: Secondary | ICD-10-CM | POA: Diagnosis not present

## 2022-09-17 DIAGNOSIS — I480 Paroxysmal atrial fibrillation: Secondary | ICD-10-CM | POA: Diagnosis not present

## 2022-09-17 DIAGNOSIS — I4892 Unspecified atrial flutter: Secondary | ICD-10-CM | POA: Diagnosis not present

## 2022-09-17 LAB — BASIC METABOLIC PANEL
Anion gap: 11 (ref 5–15)
BUN: 22 mg/dL (ref 8–23)
CO2: 27 mmol/L (ref 22–32)
Calcium: 8.9 mg/dL (ref 8.9–10.3)
Chloride: 101 mmol/L (ref 98–111)
Creatinine, Ser: 0.92 mg/dL (ref 0.44–1.00)
GFR, Estimated: >60 mL/min (ref >60)
Glucose, Bld: 115 mg/dL — ABNORMAL HIGH (ref 70–99)
Potassium: 4 mmol/L (ref 3.5–5.1)
Sodium: 139 mmol/L (ref 135–145)

## 2022-09-17 LAB — CBC WITH DIFFERENTIAL/PLATELET
Abs Immature Granulocytes: 0.01 10*3/uL (ref 0.00–0.07)
Basophils Absolute: 0 10*3/uL (ref 0.0–0.1)
Basophils Relative: 1 %
Eosinophils Absolute: 0.1 10*3/uL (ref 0.0–0.5)
Eosinophils Relative: 2 %
HCT: 39.5 % (ref 36.0–46.0)
Hemoglobin: 12.9 g/dL (ref 12.0–15.0)
Immature Granulocytes: 0 %
Lymphocytes Relative: 23 %
Lymphs Abs: 1.4 10*3/uL (ref 0.7–4.0)
MCH: 32.2 pg (ref 26.0–34.0)
MCHC: 32.7 g/dL (ref 30.0–36.0)
MCV: 98.5 fL (ref 80.0–100.0)
Monocytes Absolute: 0.7 10*3/uL (ref 0.1–1.0)
Monocytes Relative: 12 %
Neutro Abs: 3.8 10*3/uL (ref 1.7–7.7)
Neutrophils Relative %: 62 %
Platelets: 165 10*3/uL (ref 150–400)
RBC: 4.01 MIL/uL (ref 3.87–5.11)
RDW: 13.1 % (ref 11.5–15.5)
WBC: 6.1 10*3/uL (ref 4.0–10.5)
nRBC: 0 % (ref 0.0–0.2)

## 2022-09-17 NOTE — Telephone Encounter (Signed)
Reaching out to patient to offer assistance regarding upcoming cardiac imaging study; pt verbalizes understanding of appt date/time, parking situation and where to check in, pre-test NPO status and medications ordered, and verified current allergies; name and call back number provided for further questions should they arise Marchia Bond RN Navigator Cardiac Imaging [facility] [phone] office [phone] cell  [place]  Denies iv issues Aware contrast  Holding lasix

## 2022-09-17 NOTE — Telephone Encounter (Signed)
Prescription refill request for Eliquis received. Indication: AF Last office visit: 08/03/22  Gypsy Lore NP Scr: 0.86 on 08/16/22 Age: 82 Weight: 77.7kg  Based on above findings Eliquis 5mg  twice daily is the appropriate dose.  Refill approved.

## 2022-09-17 NOTE — Telephone Encounter (Signed)
Refill Request.  

## 2022-09-18 ENCOUNTER — Ambulatory Visit
Admission: RE | Admit: 2022-09-18 | Discharge: 2022-09-18 | Disposition: A | Discharge status: Home / Self Care | Payer: Medicare Other | Source: Ambulatory Visit | Attending: Cardiology | Admitting: Cardiology

## 2022-09-18 DIAGNOSIS — I480 Paroxysmal atrial fibrillation: Secondary | ICD-10-CM | POA: Insufficient documentation

## 2022-09-18 DIAGNOSIS — I5042 Chronic combined systolic (congestive) and diastolic (congestive) heart failure: Secondary | ICD-10-CM

## 2022-09-18 DIAGNOSIS — I4892 Unspecified atrial flutter: Secondary | ICD-10-CM | POA: Diagnosis not present

## 2022-09-18 MED ORDER — IOHEXOL 350 MG/ML SOLN
100.0000 mL | Freq: Once | INTRAVENOUS | Status: AC | PRN
  Administered 2022-09-18: 100 mL via INTRAVENOUS

## 2022-09-21 ENCOUNTER — Telehealth: Payer: Self-pay | Admitting: Cardiology

## 2022-09-21 NOTE — Telephone Encounter (Signed)
Spoke with the patient and advised that she does not need to be premedicated. Advised her to let staff know about her back spasms upon arrival for procedure. She verbalized understanding.

## 2022-09-21 NOTE — Telephone Encounter (Signed)
Pt has some questions/concerns before her ablation next week.   She states she had a knee replacement in 2016 and wants to know if she needs to premedicate before her ablation like she does before dental procedures. "When I lie flat, my back goes into spasms and I want to let them know ahead of time, just in case they need to put something in the IV"

## 2022-09-24 DIAGNOSIS — M9901 Segmental and somatic dysfunction of cervical region: Secondary | ICD-10-CM | POA: Diagnosis not present

## 2022-09-24 DIAGNOSIS — M5033 Other cervical disc degeneration, cervicothoracic region: Secondary | ICD-10-CM | POA: Diagnosis not present

## 2022-09-24 DIAGNOSIS — M9903 Segmental and somatic dysfunction of lumbar region: Secondary | ICD-10-CM | POA: Diagnosis not present

## 2022-09-24 DIAGNOSIS — M5416 Radiculopathy, lumbar region: Secondary | ICD-10-CM | POA: Diagnosis not present

## 2022-09-24 NOTE — Pre-Procedure Instructions (Signed)
Attempted to call patient regarding procedure instructions.  Left voicemail on the following items: Arrival time 0830 Nothing to eat or drink after midnight No meds AM of procedure Responsible person to drive you home and stay with you for 24 hrs  Have you missed any doses of anti-coagulant Eliquis- if you have missed any doses please let office know right away.

## 2022-09-25 ENCOUNTER — Other Ambulatory Visit: Payer: Self-pay

## 2022-09-25 ENCOUNTER — Encounter (HOSPITAL_COMMUNITY)
Admission: RE | Disposition: A | Discharge status: Home / Self Care | Payer: Medicare Other | Source: Ambulatory Visit | Attending: Cardiology

## 2022-09-25 ENCOUNTER — Other Ambulatory Visit (HOSPITAL_COMMUNITY): Payer: Self-pay

## 2022-09-25 ENCOUNTER — Ambulatory Visit (HOSPITAL_BASED_OUTPATIENT_CLINIC_OR_DEPARTMENT_OTHER): Payer: Medicare Other | Admitting: Anesthesiology

## 2022-09-25 ENCOUNTER — Ambulatory Visit (HOSPITAL_COMMUNITY): Payer: Medicare Other | Admitting: Anesthesiology

## 2022-09-25 ENCOUNTER — Ambulatory Visit (HOSPITAL_COMMUNITY)
Admission: RE | Admit: 2022-09-25 | Discharge: 2022-09-25 | Disposition: A | Discharge status: Home / Self Care | Payer: Medicare Other | Source: Ambulatory Visit | Attending: Cardiology | Admitting: Cardiology

## 2022-09-25 DIAGNOSIS — E119 Type 2 diabetes mellitus without complications: Secondary | ICD-10-CM | POA: Diagnosis not present

## 2022-09-25 DIAGNOSIS — I2510 Atherosclerotic heart disease of native coronary artery without angina pectoris: Secondary | ICD-10-CM | POA: Diagnosis not present

## 2022-09-25 DIAGNOSIS — E1151 Type 2 diabetes mellitus with diabetic peripheral angiopathy without gangrene: Secondary | ICD-10-CM

## 2022-09-25 DIAGNOSIS — I4891 Unspecified atrial fibrillation: Secondary | ICD-10-CM | POA: Insufficient documentation

## 2022-09-25 DIAGNOSIS — Z794 Long term (current) use of insulin: Secondary | ICD-10-CM

## 2022-09-25 DIAGNOSIS — I110 Hypertensive heart disease with heart failure: Secondary | ICD-10-CM | POA: Diagnosis not present

## 2022-09-25 DIAGNOSIS — I5042 Chronic combined systolic (congestive) and diastolic (congestive) heart failure: Secondary | ICD-10-CM | POA: Insufficient documentation

## 2022-09-25 DIAGNOSIS — Z7901 Long term (current) use of anticoagulants: Secondary | ICD-10-CM | POA: Diagnosis not present

## 2022-09-25 DIAGNOSIS — I483 Typical atrial flutter: Secondary | ICD-10-CM | POA: Diagnosis not present

## 2022-09-25 DIAGNOSIS — I509 Heart failure, unspecified: Secondary | ICD-10-CM | POA: Diagnosis not present

## 2022-09-25 DIAGNOSIS — E785 Hyperlipidemia, unspecified: Secondary | ICD-10-CM | POA: Insufficient documentation

## 2022-09-25 HISTORY — PX: ATRIAL FIBRILLATION ABLATION: EP1191

## 2022-09-25 LAB — GLUCOSE, CAPILLARY
Glucose-Capillary: 141 mg/dL — ABNORMAL HIGH (ref 70–99)
Glucose-Capillary: 107 mg/dL — ABNORMAL HIGH (ref 70–99)

## 2022-09-25 LAB — POCT ACTIVATED CLOTTING TIME
Activated Clotting Time: 325 seconds
Activated Clotting Time: 380 seconds

## 2022-09-25 SURGERY — ATRIAL FIBRILLATION ABLATION
Anesthesia: General

## 2022-09-25 MED ORDER — ACETAMINOPHEN 325 MG PO TABS: 650.0000 mg | ORAL_TABLET | ORAL | Status: DC | PRN

## 2022-09-25 MED ORDER — PROTAMINE SULFATE 10 MG/ML IV SOLN
INTRAVENOUS | Status: DC | PRN
  Administered 2022-09-25: 20 mg via INTRAVENOUS
  Administered 2022-09-25: 10 mg via INTRAVENOUS

## 2022-09-25 MED ORDER — TRAMADOL HCL 50 MG PO TABS
50.0000 mg | ORAL_TABLET | Freq: Once | ORAL | Status: AC
  Administered 2022-09-25: 50 mg via ORAL
  Filled 2022-09-25: qty 1

## 2022-09-25 MED ORDER — HEPARIN SODIUM (PORCINE) 1000 UNIT/ML IJ SOLN
INTRAMUSCULAR | Status: DC | PRN
  Administered 2022-09-25: 12000 [IU] via INTRAVENOUS

## 2022-09-25 MED ORDER — PHENYLEPHRINE HCL-NACL 20-0.9 MG/250ML-% IV SOLN
INTRAVENOUS | Status: DC | PRN
  Administered 2022-09-25: 25 ug/min via INTRAVENOUS

## 2022-09-25 MED ORDER — ACETAMINOPHEN 500 MG PO TABS
1000.0000 mg | ORAL_TABLET | Freq: Once | ORAL | Status: AC
  Administered 2022-09-25: 1000 mg via ORAL
  Filled 2022-09-25: qty 2

## 2022-09-25 MED ORDER — FUROSEMIDE 40 MG PO TABS
40.0000 mg | ORAL_TABLET | Freq: Two times a day (BID) | ORAL | Status: DC
  Administered 2022-09-25: 40 mg via ORAL
  Filled 2022-09-25: qty 1

## 2022-09-25 MED ORDER — LIDOCAINE 2% (20 MG/ML) 5 ML SYRINGE
INTRAMUSCULAR | Status: DC | PRN
  Administered 2022-09-25: 20 mg via INTRAVENOUS

## 2022-09-25 MED ORDER — SUGAMMADEX SODIUM 200 MG/2ML IV SOLN
INTRAVENOUS | Status: DC | PRN
  Administered 2022-09-25: 50 mg via INTRAVENOUS
  Administered 2022-09-25: 100 mg via INTRAVENOUS
  Administered 2022-09-25: 150 mg via INTRAVENOUS

## 2022-09-25 MED ORDER — APIXABAN 5 MG PO TABS
5.0000 mg | ORAL_TABLET | Freq: Two times a day (BID) | ORAL | Status: DC
  Administered 2022-09-25: 5 mg via ORAL
  Filled 2022-09-25: qty 1

## 2022-09-25 MED ORDER — SODIUM CHLORIDE 0.9 % IV SOLN: INTRAVENOUS | Status: DC

## 2022-09-25 MED ORDER — HEPARIN (PORCINE) IN NACL 1000-0.9 UT/500ML-% IV SOLN
INTRAVENOUS | Status: DC | PRN
  Administered 2022-09-25 (×3): 500 mL

## 2022-09-25 MED ORDER — PANTOPRAZOLE SODIUM 40 MG PO TBEC
40.0000 mg | DELAYED_RELEASE_TABLET | Freq: Every day | ORAL | Status: DC
  Administered 2022-09-25: 40 mg via ORAL
  Filled 2022-09-25: qty 1

## 2022-09-25 MED ORDER — SODIUM CHLORIDE 0.9 % IV SOLN: 250.0000 mL | INTRAVENOUS | Status: DC | PRN

## 2022-09-25 MED ORDER — ROCURONIUM BROMIDE 10 MG/ML (PF) SYRINGE
PREFILLED_SYRINGE | INTRAVENOUS | Status: DC | PRN
  Administered 2022-09-25: 60 mg via INTRAVENOUS

## 2022-09-25 MED ORDER — SODIUM CHLORIDE 0.9% FLUSH: 3.0000 mL | Freq: Two times a day (BID) | INTRAVENOUS | Status: DC

## 2022-09-25 MED ORDER — ONDANSETRON HCL 4 MG/2ML IJ SOLN
INTRAMUSCULAR | Status: DC | PRN
  Administered 2022-09-25: 4 mg via INTRAVENOUS

## 2022-09-25 MED ORDER — PANTOPRAZOLE SODIUM 40 MG PO TBEC
40.0000 mg | DELAYED_RELEASE_TABLET | Freq: Every day | ORAL | 0 refills | Status: DC
  Filled 2022-09-25: qty 45, 45d supply, fill #0

## 2022-09-25 MED ORDER — HEPARIN SODIUM (PORCINE) 1000 UNIT/ML IJ SOLN
INTRAMUSCULAR | Status: DC | PRN
  Administered 2022-09-25: 1000 [IU] via INTRAVENOUS

## 2022-09-25 MED ORDER — FENTANYL CITRATE (PF) 250 MCG/5ML IJ SOLN
INTRAMUSCULAR | Status: DC | PRN
  Administered 2022-09-25: 100 ug via INTRAVENOUS

## 2022-09-25 MED ORDER — PROPOFOL 10 MG/ML IV BOLUS
INTRAVENOUS | Status: DC | PRN
  Administered 2022-09-25: 150 mg via INTRAVENOUS

## 2022-09-25 MED ORDER — SODIUM CHLORIDE 0.9% FLUSH: 3.0000 mL | INTRAVENOUS | Status: DC | PRN

## 2022-09-25 MED ORDER — COLCHICINE 0.6 MG PO TABS
0.6000 mg | ORAL_TABLET | Freq: Two times a day (BID) | ORAL | 0 refills | Status: DC
  Filled 2022-09-25: qty 10, 5d supply, fill #0

## 2022-09-25 MED ORDER — COLCHICINE 0.6 MG PO TABS
0.6000 mg | ORAL_TABLET | Freq: Two times a day (BID) | ORAL | Status: DC
  Administered 2022-09-25: 0.6 mg via ORAL
  Filled 2022-09-25: qty 1

## 2022-09-25 MED ORDER — ONDANSETRON HCL 4 MG/2ML IJ SOLN: 4.0000 mg | Freq: Four times a day (QID) | INTRAMUSCULAR | Status: DC | PRN

## 2022-09-25 SURGICAL SUPPLY — 18 items
BAG SNAP BAND KOVER 36X36 (MISCELLANEOUS) IMPLANT
CATH ABLAT QDOT MICRO BI TC DF (CATHETERS) IMPLANT
CATH OCTARAY 2.0 F 3-3-3-3-3 (CATHETERS) IMPLANT
CATH S-M CIRCA TEMP PROBE (CATHETERS) IMPLANT
CATH SOUNDSTAR ECO 8FR (CATHETERS) IMPLANT
CATH WEB BI DIR CSDF CRV REPRO (CATHETERS) IMPLANT
CLOSURE PERCLOSE PROSTYLE (VASCULAR PRODUCTS) IMPLANT
COVER SWIFTLINK CONNECTOR (BAG) ×1 IMPLANT
PACK EP LATEX FREE (CUSTOM PROCEDURE TRAY) ×1
PACK EP LF (CUSTOM PROCEDURE TRAY) ×1 IMPLANT
PAD DEFIB RADIO PHYSIO CONN (PAD) ×1 IMPLANT
PATCH CARTO3 (PAD) IMPLANT
SHEATH BAYLIS TRANSSEPTAL 98CM (NEEDLE) IMPLANT
SHEATH CARTO VIZIGO SM CVD (SHEATH) IMPLANT
SHEATH PINNACLE 8F 10CM (SHEATH) IMPLANT
SHEATH PINNACLE 9F 10CM (SHEATH) IMPLANT
SHEATH PROBE COVER 6X72 (BAG) IMPLANT
TUBING SMART ABLATE COOLFLOW (TUBING) IMPLANT

## 2022-09-25 NOTE — Discharge Instructions (Signed)

## 2022-09-25 NOTE — Anesthesia Postprocedure Evaluation (Signed)
Anesthesia Post Note  Patient: Bonnie Rowland  Procedure(s) Performed: ATRIAL FIBRILLATION ABLATION     Patient location during evaluation: Phase II Anesthesia Type: General Level of consciousness: awake and alert, patient cooperative and oriented Pain management: pain level controlled (only c/o is back pain) Vital Signs Assessment: post-procedure vital signs reviewed and stable Respiratory status: spontaneous breathing, nonlabored ventilation and respiratory function stable Cardiovascular status: blood pressure returned to baseline and stable Postop Assessment: no apparent nausea or vomiting Anesthetic complications: no   There were no known notable events for this encounter.  Last Vitals:  Vitals:   09/25/22 1428 09/25/22 1430  BP: (!) 121/48 (!) 119/43  Pulse: 72 72  Resp: 15 18  Temp:    SpO2: 91% 92%    Last Pain:  Vitals:   09/25/22 1420  TempSrc:   PainSc: 5                  Desma Wilkowski,E. Candise Crabtree

## 2022-09-25 NOTE — Anesthesia Preprocedure Evaluation (Addendum)
Anesthesia Evaluation  Patient identified by MRN, date of birth, ID band Patient awake    Reviewed: Allergy & Precautions, NPO status , Patient's Chart, lab work & pertinent test results, reviewed documented beta blocker date and time   History of Anesthesia Complications Negative for: history of anesthetic complications  Airway Mallampati: II  TM Distance: >3 FB Neck ROM: Full    Dental  (+) Dental Advisory Given   Pulmonary neg pulmonary ROS   breath sounds clear to auscultation       Cardiovascular hypertension, Pt. on medications and Pt. on home beta blockers (-) angina + CAD ('22 cath: non-obstructive ASCAD), + Peripheral Vascular Disease and +CHF (entresto)  + dysrhythmias Atrial Fibrillation  Rhythm:Regular Rate:Normal  '22 ECHO: EF 60-65%. The LV has normal function, no regional wall motion abnormalities. No significant valvular abnormalities    Neuro/Psych negative neurological ROS     GI/Hepatic negative GI ROS, Neg liver ROS,,,  Endo/Other  diabetes (glu 141), Insulin Dependent    Renal/GU negative Renal ROS     Musculoskeletal   Abdominal   Peds  Hematology eliquis   Anesthesia Other Findings   Reproductive/Obstetrics                             Anesthesia Physical Anesthesia Plan  ASA: 3  Anesthesia Plan: General   Post-op Pain Management: Tylenol PO (pre-op)*   Induction: Intravenous  PONV Risk Score and Plan: 3 and Ondansetron, Dexamethasone and Treatment may vary due to age or medical condition  Airway Management Planned: Oral ETT  Additional Equipment: None  Intra-op Plan:   Post-operative Plan: Extubation in OR  Informed Consent: I have reviewed the patients History and Physical, chart, labs and discussed the procedure including the risks, benefits and alternatives for the proposed anesthesia with the patient or authorized representative who has indicated  his/her understanding and acceptance.     Dental advisory given  Plan Discussed with: CRNA and Surgeon  Anesthesia Plan Comments:         Anesthesia Quick Evaluation

## 2022-09-25 NOTE — H&P (Signed)
Electrophysiology Office Note:     Date:  09/25/2022    ID:  Ami, Cayton [birth year], MRN [id number]   PCP:  Steele Sizer, MD           [facility] Cardiologist:  Ida Rogue, MD  [facility] Electrophysiologist:  Vickie Epley, MD    Referring MD: Steele Sizer, MD    Chief Complaint: Atrial fibrillation   History of Present Illness:     Bonnie Rowland is a 82 y.o. female who presents for an evaluation of atrial fibrillation at the request of Dr. Rockey Situ. Their medical history includes chronic diastolic heart failure hypertension, diabetes and hyperlipidemia.  She was hospitalized in October with chest discomfort and palpitations.  She was found to be in atrial fibrillation with rapid ventricular rates.  She was cardioverted in the emergency department back to sinus rhythm but had ERAF.  She was sedated again during that ER visit and another cardioversion attempt was performed but was unsuccessful.   Today she is in clinic with her husband.  She is back in normal rhythm.  She continues to take amiodarone 100 mg by mouth once daily.  She takes her Eliquis religiously.  Presents for PVI today.    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; left shoulder blade   Cancer ([place]) 05/2014    basal cell removed left temple   Cataract 2003   Cellulitis and abscess of leg, except foot     Conjunctivitis unspecified     Dermatophytosis of nail     Diabetes mellitus      type II   Elevated troponin      Esophageal reflux     Hyperlipidemia     Hypertension     Osteoporosis 2016   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)     Renal artery stenosis (HCC)     Sprain of lumbar region     Thoracic or lumbosacral neuritis or radiculitis, unspecified     Urinary tract infection, site not specified             Past Surgical History:  Procedure Laterality Date   APPENDECTOMY       CARDIAC CATHETERIZATION       CARDIOVERSION N/A 11/30/2020    Procedure: CARDIOVERSION;  Surgeon: Minna Merritts, MD;  Location: [facility] ORS;  Service: Cardiovascular;  Laterality: N/A;   CARDIOVERSION N/A 01/20/2021    Procedure: CARDIOVERSION;  Surgeon: Minna Merritts, MD;  Location: [place] [facility];  Service: Cardiovascular;  Laterality: N/A;   CARDIOVERSION N/A 01/30/2021    Procedure: CARDIOVERSION;  Surgeon: Wellington Hampshire, MD;  Location: [facility] OR[facility];  Service: Cardiovascular;  Laterality: N/A;   cataract surgery       COLONOSCOPY       COLONOSCOPY WITH PROPOFOL N/A 05/09/2016    Procedure: COLONOSCOPY WITH PROPOFOL;  Surgeon: Robert Bellow, MD;  Location: [facility] ENDOSCOPY;  Service: Endoscopy;  Laterality: N/A;  DIAGNOSTIC MAMMOGRAM       EYE SURGERY Bilateral      Cataract Extraction   JOINT REPLACEMENT   2016   KNEE ARTHROPLASTY Right 03/30/2015    Procedure: COMPUTER ASSISTED TOTAL KNEE ARTHROPLASTY;  Surgeon: Dereck Leep, MD;  Location: [facility] ORS;  Service: Orthopedics;  Laterality: Right;   KNEE ARTHROSCOPY Right     KNEE CLOSED REDUCTION Right 05/23/2015    Procedure: CLOSED MANIPULATION KNEE;  Surgeon: Dereck Leep, MD;  Location: [facility];  Service: Orthopedics;  Laterality: Right;   RIGHT/LEFT HEART CATH AND CORONARY ANGIOGRAPHY N/A 01/25/2021    Procedure: RIGHT/LEFT HEART CATH AND CORONARY ANGIOGRAPHY;  Surgeon: Nelva Bush, MD;  Location: [place] [facility];  Service: Cardiovascular;  Laterality: N/A;   TONSILLECTOMY          Current Medications: Active Medications      Current Meds   Medication Sig   acetaminophen (TYLENOL) 650 MG CR tablet Take 1,300 mg by mouth at bedtime.   ALFALFA PO Take 1 tablet by mouth daily.   amiodarone (PACERONE) 200 MG tablet Take 0.5 tablets (100 mg total) by mouth daily.   ascorbic Acid (VITAMIN C) 500 MG CPCR Take 500 mg by mouth daily.   B Complex-C (B-COMPLEX WITH VITAMIN C) tablet Take 1 tablet by mouth 2 (two) times daily.   BD PEN NEEDLE NANO U/F 32G X 4 MM MISC USE 4 TIMES DAILY (HUMALOG 3 TIMES DAILY AND LANTUS ONCE DAILY) OFFICE NOTIFIED 08/06/17   BLACK ELDERBERRY,BERRY-FLOWER, PO Take 15 mLs by mouth daily. Liquid   Calcium Carbonate (CALCIUM 600 PO) Take 600 mg by mouth daily.   carvedilol (COREG) 6.25 MG tablet Take 1 tablet (6.25 mg total) by mouth 2 (two) times daily with a meal.   cholecalciferol (VITAMIN D) 1000 UNITS tablet Take 1,000 Units by mouth daily. [place]aklee   clobetasol ointment (TEMOVATE) [id number] % APPLY 1 APPLICATION TOPICALLY 2 TIMES DAILY AS NEEDED (BUG BITES).   clotrimazole (GYNE-LOTRIMIN) 1 % vaginal cream Place 1 Applicatorful vaginally at bedtime.   Coenzyme Q10 (COQ10) 200 MG CAPS Take 200 mg by mouth daily.   Continuous Blood Gluc Sensor ([place]) MISC by Does not apply route.   Elastic Bandages & Supports (MEDICAL COMPRESSION STOCKINGS) MISC 1 each by Does not apply route daily.   ELIQUIS 5 MG TABS tablet TAKE 1 TABLET BY MOUTH TWICE A DAY   estradiol (ESTRACE VAGINAL) 0.1 MG/GM vaginal cream 1/2 gm once weekly using applicator, apply blueberry sized amount of cream using tip of finger to urethra twice weekly   Exenatide ER (BYDUREON BCISE) 2 MG/0.85ML AUIJ Inject 2 mg into the skin every Sunday.   FARXIGA 10 MG TABS tablet Take 10 mg by mouth daily.   furosemide (LASIX) 40 MG tablet Take 80 mg by mouth.   Glucosamine 500 MG TABS Take 500 mg by mouth daily.   HUMALOG KWIKPEN 100 UNIT/ML KiwkPen Inject 2-6 Units into the skin 3 (three) times daily before meals. If needed sliding scale    insulin glargine (LANTUS) 100 unit/mL SOPN Inject 14 Units into the skin at bedtime.   Krill Oil 1000 MG CAPS Take 1,000 mg by mouth daily.   Mag Aspart-Potassium Aspart (POTASSIUM & MAGNESIUM ASPARTAT PO) Take 1 tablet by mouth daily.   Multiple Vitamins-Minerals (PRESERVISION AREDS 2) CAPS Take 1 capsule by mouth 2 (two) times daily.   OVER THE COUNTER MEDICATION Place 1 application into both eyes See admin instructions. Blephadex  eyelid foam, apply to eyelids once daily   OVER THE COUNTER MEDICATION Take 1 tablet by mouth daily. Vita-Lea Gold otc supplement With out vitamin K (Shaklee products)   OVER THE COUNTER MEDICATION Take 1 tablet by mouth See admin instructions. Nutriferon otc supplement take Take 1 tablet on Sun, Tues, Thurs, Sat. Take 1 tablet twice daily on Mon, Wed, and Fri   Probiotic Product (PROBIOTIC PO) Take 1 capsule by mouth at bedtime.   PROLIA 60 MG/ML SOSY injection Inject 60 mg into the skin every 6 (six) months.   Propylene Glycol (SYSTANE BALANCE OP) Place 1 drop into both eyes daily as needed (dry eyes).   rosuvastatin (CRESTOR) 10 MG tablet TAKE 1 TABLET BY MOUTH EVERY DAY   sacubitril-valsartan (ENTRESTO) 49-51 MG Take 1 tablet by mouth 2 (two) times daily.   Sodium Chloride-Sodium Bicarb (NETI POT SINUS WASH NA) Place 1 Dose into the nose at bedtime.   tiZANidine (ZANAFLEX) 2 MG tablet TAKE 1-2 TABLETS (2-4 MG TOTAL) BY MOUTH AT BEDTIME.   Vitamin D, Ergocalciferol, (DRISDOL) 1.25 MG (50000 UT) CAPS capsule Take 50,000 Units by mouth every Saturday.        Allergies:   Cyclobenzaprine, Cheese, Ciprofloxacin hcl, Coconut (cocos nucifera), and Keflex [cephalexin]    Social History         Socioeconomic History   Marital status: Married      Spouse name: Emergency planning/management officer   Number of children: 1   Years of education: Not on file   Highest education level: Master's degree (e.g., MA, MS, MEng, MEd, MSW, MBA)  Occupational History   Occupation: Retired  Tobacco Use    Smoking status: Never   Smokeless tobacco: Never   Tobacco comments:      Smoking cessation materials not required  Vaping Use   Vaping Use: Never used  Substance and Sexual Activity   Alcohol use: Not Currently      Alcohol/week: 1.0 standard drink of alcohol      Types: 1 Glasses of wine per week      Comment: RARELY   Drug use: No   Sexual activity: Yes      Partners: Male      Birth control/protection: Post-menopausal  Other Topics Concern   Not on file  Social History Narrative    She is married , only has one son and he was diagnosed with colon cancer at age 32.     Social Determinants of Health        Financial Resource Strain: Low Risk  (04/26/2022)    Overall Financial Resource Strain (CARDIA)     Difficulty of Paying Living Expenses: Not hard at all  Food Insecurity: No Food Insecurity (04/26/2022)    Hunger Vital Sign     Worried About Running Out of Food in the Last Year: Never true     Ran Out of Food in the Last Year: Never true  Transportation Needs: No Transportation Needs (04/26/2022)    PRAPARE - Armed forces logistics/support/administrative officer (Medical): No     Lack of Transportation (Non-Medical): No  Physical Activity: Sufficiently Active (04/26/2022)    Exercise Vital Sign     Days of Exercise per Week: 7 days     Minutes of Exercise per Session: 30 min  Stress: No Stress Concern Present (04/26/2022)    Bonnie Rowland     Feeling of Stress : Not at all  Social Connections:  Socially Integrated (04/26/2022)    Social Connection and Isolation Panel [NHANES]     Frequency of Communication with Friends and Family: More than three times a week     Frequency of Social Gatherings with Friends and Family: More than three times a week     Attends Religious Services: More than 4 times per year     Active Member of [employer] or Organizations: Yes     Attends Music therapist: More than 4 times per  year     Marital Status: Married      Family History: The patient's family history includes Breast cancer (age of onset: 37) in her maternal aunt; Cancer in her father and mother; Colon cancer in her son; Diabetes in her mother; Hypertension in her father and mother; Kidney disease in her mother. There is no history of Kidney cancer or Bladder Cancer.   ROS:   Please see the history of present illness.    All other systems reviewed and are negative.   EKGs/Labs/Other Studies Reviewed:     The following studies were reviewed today:   March 31, 2021 echo EF 60 RV normal No MR   January 25, 2021 left and right heart cath Mild to moderate, non-obstructive coronary artery disease, including 30% mid LAD disease, 50-60% mid/distal LCx stenosis, and 60% proximal/mid RCA lesion. Mildly to moderately reduced left ventricular systolic function with mid anterior hypo/akinesis; query atypical Takotsubo variant.  LVEF ~45%. Mildly elevated left heart filling pressures. Moderately elevated right heart filling and pulmonary artery pressures with significant pulmonary vascular resistance. Mildly reduced Fick cardiac output/index.   EKG from April 27, 2022 reviewed and shows atrial flutter with a QTc of about 430 ms.     Recent Labs: 04/11/2022: ALT 21; B Natriuretic Peptide 347.5; BUN 28; Creatinine, Ser 1.03; Hemoglobin 13.8; Magnesium 2.4; Platelets 199; Potassium 4.4; Sodium 139 04/12/2022: TSH 1.422  Recent Lipid Panel Labs (Brief)          Component Value Date/Time    CHOL 127 07/12/2021 0844    CHOL 110 01/12/2016 0817    TRIG 59 07/12/2021 0844    HDL 70 07/12/2021 0844    HDL 59 01/12/2016 0817    CHOLHDL 1.8 07/12/2021 0844    LDLCALC 43 07/12/2021 0844        Physical Exam:     VS:  BP 167/53   Pulse 61   Ht 5\' 3"  (1.6 m)   Wt 171 lb (77.6 kg)   SpO2 98%   BMI 30.29 kg/m         Wt Readings from Last 3 Encounters:  06/13/22 171 lb (77.6 kg)  05/01/22 174 lb  (78.9 kg)  04/27/22 173 lb (78.5 kg)      GEN:  Well nourished, well developed in no acute distress HEENT: Normal NECK: No JVD; No carotid bruits LYMPHATICS: No lymphadenopathy CARDIAC: rrr, no murmurs, rubs, gallops RESPIRATORY:  Clear to auscultation without rales, wheezing or rhonchi  ABDOMEN: Soft, non-tender, non-distended MUSCULOSKELETAL:  No edema; No deformity  SKIN: Warm and dry NEUROLOGIC:  Alert and oriented x 3 PSYCHIATRIC:  Normal affect          Assessment ASSESSMENT:     1. AF (paroxysmal atrial fibrillation) ([place])   2. Atrial flutter, unspecified type ([place])   3. Chronic combined systolic and diastolic CHF, NYHA class 2 (HCC)     PLAN:     In order of problems listed above:   #Persistent atrial  fibrillation and flutter Is on amiodarone 100 mg by mouth daily Takes Eliquis for stroke prophylaxis A long discussion today about the options for her atrial fibrillation including continued antiarrhythmic drugs and catheter ablation.  The patient is very interested in catheter ablation.  I discussed the procedure in detail including risks, recovery and likelihood of success and she wishes to proceed.   Discussed treatment options today for their AF including antiarrhythmic drug therapy and ablation. Discussed risks, recovery and likelihood of success. Discussed potential need for repeat ablation procedures and antiarrhythmic drugs after an initial ablation. They wish to proceed with scheduling.   Risk, benefits, and alternatives to EP study and radiofrequency ablation for afib were also discussed in detail today. These risks include but are not limited to stroke, bleeding, vascular damage, tamponade, perforation, damage to the esophagus, lungs, and other structures, pulmonary vein stenosis, worsening renal function, and death. The patient understands these risk and wishes to proceed.  We will therefore proceed with catheter ablation at the next available time.  Carto, ICE,  anesthesia are requested for the procedure.  Will also obtain CT PV protocol prior to the procedure to exclude LAA thrombus and further evaluate atrial anatomy.      Presents for PVI today.      Signed, Hilton Cork. Quentin Ore, MD, [facility], [facility] 09/25/2022 Electrophysiology [facility]

## 2022-09-25 NOTE — Transfer of Care (Signed)
Immediate Anesthesia Transfer of Care Note  Patient: Bonnie Rowland  Procedure(s) Performed: ATRIAL FIBRILLATION ABLATION  Patient Location: Cath Lab  Anesthesia Type:General  Level of Consciousness: awake, alert , patient cooperative, and responds to stimulation  Airway & Oxygen Therapy: Patient Spontanous Breathing and Patient connected to nasal cannula oxygen  Post-op Assessment: Report given to RN and Post -op Vital signs reviewed and stable  Post vital signs: Reviewed and stable  Last Vitals:  Vitals Value Taken Time  BP 124/90   Temp    Pulse    Resp    SpO2      Last Pain:  Vitals:   09/25/22 0916  TempSrc:   PainSc: 0-No pain         Complications: There were no known notable events for this encounter.

## 2022-09-25 NOTE — Anesthesia Procedure Notes (Addendum)
Procedure Name: Intubation Date/Time: 09/25/2022 10:39 AM  Performed by: Glynda Jaeger, CRNAPre-anesthesia Checklist: Patient identified, Patient being monitored, Timeout performed, Emergency Drugs available and Suction available Patient Re-evaluated:Patient Re-evaluated prior to induction Oxygen Delivery Method: Circle System Utilized Preoxygenation: Pre-oxygenation with 100% oxygen Induction Type: IV induction, Rapid sequence and Cricoid Pressure applied Laryngoscope Size: Mac and 3 Grade View: Grade I Tube type: Oral Tube size: 7.0 mm Number of attempts: 1 Airway Equipment and Method: Stylet Placement Confirmation: ETT inserted through vocal cords under direct vision, positive ETCO2 and breath sounds checked- equal and bilateral Secured at: 21 cm Tube secured with: Tape Dental Injury: Teeth and Oropharynx as per pre-operative assessment

## 2022-09-26 ENCOUNTER — Encounter (HOSPITAL_COMMUNITY): Payer: Self-pay | Admitting: Cardiology

## 2022-09-29 ENCOUNTER — Telehealth: Payer: Self-pay | Admitting: Physician Assistant

## 2022-09-29 NOTE — Telephone Encounter (Signed)
Patient called the answering service this morning noting severe diarrhea with colchicine. Started following ablation on 3/26. She has been able to complete 3 days of therapy (recommended 5 day course). Given significant adverse effect, ok to stop colchicine. She will take ibuprofen 600 mg tid for the next 2 days then stop. She will remain on PPI. Follow up with EP as directed.

## 2022-10-01 ENCOUNTER — Telehealth: Payer: Self-pay | Admitting: Cardiology

## 2022-10-01 NOTE — Telephone Encounter (Signed)
Patient called stating she is schedule for her prolia shot tomorrow.  She had ablation done last week on 09/25/22.  She wants to make sure it's okay to have this shot done so close to having the ablation done. Please advise.

## 2022-10-01 NOTE — Telephone Encounter (Signed)
Spoke with the patient and advised that she should be fine to get Prolia injection.

## 2022-10-02 DIAGNOSIS — M810 Age-related osteoporosis without current pathological fracture: Secondary | ICD-10-CM | POA: Diagnosis not present

## 2022-10-03 ENCOUNTER — Ambulatory Visit: Payer: Medicare Other

## 2022-10-03 ENCOUNTER — Other Ambulatory Visit (HOSPITAL_COMMUNITY): Payer: Self-pay

## 2022-10-03 DIAGNOSIS — I480 Paroxysmal atrial fibrillation: Secondary | ICD-10-CM

## 2022-10-03 DIAGNOSIS — E119 Type 2 diabetes mellitus without complications: Secondary | ICD-10-CM

## 2022-10-03 NOTE — Progress Notes (Unsigned)
Care Management & Coordination Services Pharmacy Note  10/03/2022 Name:  Bonnie Rowland MRN:  [id number] DOB:  April 19, 1941  Summary: Patient presents for follow-up consult.   -Patient underwent ablation which went well. She developed significant diarrhea after starting on colchicine. She is hopeful that she may be able to get off amiodarone in the future.   -Patient was started on Mounjaro, but remains with Bydureon due to supply issues with getting Mounjaro.   Recommendations/Changes made from today's visit: Continue current medications  Follow up plan: CPP follow-up 3 months   Subjective: Bonnie Rowland is an 82 y.o. year old female who is a primary patient of Steele Sizer, MD.  The care coordination team was consulted for assistance with disease management and care coordination needs.    Engaged with patient by telephone for follow up visit.  Recent office visits: 08/16/22: Patient presented to Dr. Ancil Boozer for follow-up.   Recent consult visits: 08/23/22: Patient presented to Dr. Honor Junes (Endocrinology) for follow-up. Mounjaro 10 mg weekly.   Hospital visits: 09/25/22: Patient hospitalized for A-Fib ablation.    Objective:  Lab Results  Component Value Date   CREATININE 0.92 09/17/2022   BUN 22 09/17/2022   EGFR 69 08/16/2022   GFRNONAA >60 09/17/2022   GFRAA 102 04/11/2018   NA 139 09/17/2022   K 4.0 09/17/2022   CALCIUM 8.9 09/17/2022   CO2 27 09/17/2022   GLUCOSE 115 (H) 09/17/2022    Lab Results  Component Value Date/Time   HGBA1C 6.4 (H) 04/12/2022 01:01 PM   HGBA1C 6.7 09/21/2021 12:00 AM   HGBA1C 7.1 03/23/2021 12:00 AM   MICROALBUR <0.2 01/09/2022 08:49 AM   MICROALBUR Negative 08/21/2019 12:00 AM   MICROALBUR 0 02/18/2017 09:01 AM   MICROALBUR 50 04/23/2016 10:37 AM    Last diabetic Eye exam:  Lab Results  Component Value Date/Time   HMDIABEYEEXA Retinopathy (A) 01/31/2022 12:00 AM    Last diabetic Foot exam: No results found for:  "HMDIABFOOTEX"   Lab Results  Component Value Date   CHOL 144 08/16/2022   HDL 79 08/16/2022   LDLCALC 50 08/16/2022   TRIG 73 08/16/2022   CHOLHDL 1.8 08/16/2022       Latest Ref Rng & Units 08/16/2022    9:42 AM 04/11/2022   11:47 PM 07/12/2021    8:44 AM  Hepatic Function  Total Protein 6.1 - 8.1 g/dL 6.7  7.4  6.4   Albumin 3.5 - 5.0 g/dL  4.3    AST 10 - 35 U/L 25  45  35   ALT 6 - 29 U/L 18  21  23    Alk Phosphatase 38 - 126 U/L  64    Total Bilirubin 0.2 - 1.2 mg/dL 0.8  1.4  0.8     Lab Results  Component Value Date/Time   TSH 1.422 04/12/2022 01:01 PM   TSH 6.13 (H) 01/09/2022 08:49 AM   TSH 8.69 (H) 07/12/2021 08:44 AM   FREET4 1.53 04/25/2021 10:32 AM       Latest Ref Rng & Units 09/17/2022   11:09 AM 08/16/2022    9:42 AM 04/11/2022   11:47 PM  CBC  WBC 4.0 - 10.5 K/uL 6.1  5.8  9.3   Hemoglobin 12.0 - 15.0 g/dL 12.9  14.1  13.8   Hematocrit 36.0 - 46.0 % 39.5  41.6  42.0   Platelets 150 - 400 K/uL 165  179  199     Lab Results  Component Value Date/Time  VD25OH 93 08/16/2022 09:42 AM   VD25OH 28 (L) 08/22/2017 09:35 AM    Clinical ASCVD: No  The 10-year ASCVD risk score (Arnett DK, et al., 2019) is: 77.6%   Values used to calculate the score:     Age: 82 years     Sex: Female     Is Non-Hispanic African American: No     Diabetic: Yes     Tobacco smoker: No     Systolic Blood Pressure: 0000000 mmHg     Is BP treated: Yes     HDL Cholesterol: 79 mg/dL     Total Cholesterol: 144 mg/dL       08/16/2022    8:33 AM 04/26/2022    1:59 PM 02/12/2022    9:11 AM  Depression screen PHQ 2/9  Decreased Interest 0 0 0  Down, Depressed, Hopeless 0 0 0  PHQ - 2 Score 0 0 0  Altered sleeping 0  0  Tired, decreased energy 0  0  Change in appetite 0  0  Feeling bad or failure about yourself  0  0  Trouble concentrating 0  0  Moving slowly or fidgety/restless 0  0  Suicidal thoughts 0  0  PHQ-9 Score 0  0     Social History   Tobacco Use  Smoking  Status Never  Smokeless Tobacco Never  Tobacco Comments   Smoking cessation materials not required   BP Readings from Last 3 Encounters:  09/25/22 (!) 176/63  08/16/22 130/68  08/03/22 (!) 150/70   Pulse Readings from Last 3 Encounters:  09/25/22 77  08/16/22 71  08/03/22 64   Wt Readings from Last 3 Encounters:  09/25/22 169 lb (76.7 kg)  08/16/22 171 lb (77.6 kg)  08/03/22 171 lb 4 oz (77.7 kg)   BMI Readings from Last 3 Encounters:  09/25/22 29.94 kg/m  08/16/22 30.29 kg/m  08/03/22 30.34 kg/m    Allergies  Allergen Reactions   Flexeril [Cyclobenzaprine] Hypertension   Cheese Other (See Comments)    bloating   Ciprofloxacin Hcl Other (See Comments)    Muscle pain   Coconut (Cocos Nucifera)     Upset stomach   Keflex [Cephalexin] Other (See Comments)    Patient prefers not to take this medication due to the side effects, caused tendon issues    Medications Reviewed Today     Reviewed by Fatima Blank, RN (Registered Nurse) on 09/25/22 at [facility] Status: Complete   Medication Order Taking? Sig Documenting Provider Last Dose Status Informant  acetaminophen (TYLENOL) 650 MG CR tablet [id number] Yes Take 1,300 mg by mouth at bedtime. [provider] 09/24/2022 Active Self  ALFALFA PO [id number] Yes Take 1 tablet by mouth daily. [provider] 09/24/2022 Active Self  amiodarone (PACERONE) 200 MG tablet [id number] Yes Take 0.5 tablets (100 mg total) by mouth daily. Rise Mu, [name] 09/24/2022 Active Self  amoxicillin (AMOXIL) 500 MG tablet [id number] Yes Take 2,000 mg by mouth See admin instructions. Takes before dental procedures [provider]  Active Self  ascorbic Acid (VITAMIN C) 500 MG CPCR [id number] Yes Take 500 mg by mouth daily. [provider] 09/24/2022 Active Self  B Complex-C (B-COMPLEX WITH VITAMIN C) tablet [id number] Yes Take 1 tablet by mouth daily. [provider] 09/24/2022 Active Self  BD PEN NEEDLE  NANO U/F 32G X 4 MM MISC [id number] Yes USE 4 TIMES DAILY (HUMALOG 3 TIMES DAILY AND LANTUS ONCE DAILY) OFFICE NOTIFIED 08/06/17 Sowles,  Drue Stager, MD 09/24/2022 Active Self  BLACK ELDERBERRY,BERRY-FLOWER, PO [id number] Yes Take 15 mLs by mouth daily. Liquid [provider] 09/24/2022 Active Self  Calcium Carbonate (CALCIUM 600 PO) [id number] Yes Take 600 mg by mouth daily. [provider] 09/24/2022 Active Self  carvedilol (COREG) 6.25 MG tablet [id number] Yes Take 1 tablet (6.25 mg total) by mouth 2 (two) times daily with a meal. Mayra Reel, NP 09/24/2022 Active Self  cholecalciferol (VITAMIN D) 1000 UNITS tablet [id number] Yes Take 1,000 Units by mouth daily. [place] [provider] 09/24/2022 Active Self  Coenzyme Q10 (COQ10) 200 MG CAPS [id number] Yes Take 200 mg by mouth daily. [provider] 09/24/2022 Active Self  Continuous Blood Gluc Sensor ([place]) MISC [id number]  by Does not apply route. [provider]  Active Self  Elastic Bandages & Supports (Waterproof) [place] [id number]  1 each by Does not apply route daily. Steele Sizer, MD  Active Self  ELIQUIS 5 MG TABS tablet [id number] Yes TAKE 1 TABLET BY MOUTH TWICE A DAY Minna Merritts, MD 09/24/2022 1830 Active Self  empagliflozin (JARDIANCE) 25 MG TABS tablet [id number] Yes Take 25 mg by mouth daily. [provider] 09/24/2022 Active Self  estradiol (ESTRACE) 0.1 MG/GM vaginal cream [id number] Yes APPLY 1/2 GM ONCE WEEKLY USING APPLICATOR, APPLY BLUEBERRY SIZED AMOUNT OF CREAM USING TIP OF FINGER TO URETHRA TWICE WEEKLY  Patient taking differently: APPLY 1/2 GM ONCE WEEKLY USING APPLICATOR, APPLY BLUEBERRY SIZED AMOUNT OF CREAM USING TIP OF FINGER TO URETHRA THREE TIMES WEEKLY   McGowan, Larene [place] A, [name]  Active Self  Exenatide ER (BYDUREON BCISE) 2 MG/0.85ML AUIJ [id number] Yes Inject 2 mg into the skin every Sunday. [provider]  Active Self   furosemide (LASIX) 40 MG tablet [id number] Yes TAKE 1 TABLET BY MOUTH TWICE A DAY Alisa Graff, [place] 09/24/2022 Active Self  HUMALOG KWIKPEN 100 UNIT/ML KiwkPen [id number] Yes Inject 2-6 Units into the skin 3 (three) times daily as needed (High blood sugar). [provider]  Active Self  insulin glargine (LANTUS) 100 unit/mL SOPN [id number] Yes Inject 12 Units into the skin at bedtime. [provider] 09/24/2022 Active Self  Krill Oil 1000 MG CAPS [id number] Yes Take 1,000 mg by mouth daily. [provider]  Active Self  Mag Aspart-Potassium Aspart (POTASSIUM & MAGNESIUM ASPARTAT PO) [id number] Yes Take 1 tablet by mouth daily. [provider] 09/24/2022 Active Self  Multiple Vitamins-Minerals (PRESERVISION AREDS 2) CHEW [id number] Yes Chew 1 each by mouth 2 (two) times daily. [provider] 09/24/2022 Active Self  OVER THE COUNTER MEDICATION [id number] Yes Place 1 application  into both eyes See admin instructions. Blephadex eyelid foam, apply to eyelids once daily [provider] 09/25/2022 Active Self  OVER THE COUNTER MEDICATION [id number] Yes Take 1 tablet by mouth daily. Vita-Lea Gold otc supplement With out vitamin K ([place] products) [provider] 09/24/2022 Active Self  OVER THE COUNTER MEDICATION [id number] Yes Take 1 tablet by mouth daily. Nutriferon otc supplement [provider] 09/24/2022 Active Self  Probiotic Product (PROBIOTIC PO) [id number] Yes Take 1 capsule by mouth at bedtime. [provider] 09/24/2022 Active Self  PROLIA 60 MG/ML SOSY injection [id number] Yes Inject 60 mg into the skin every 6 (six) months. [provider] Past Month Active Self  Propylene Glycol (SYSTANE BALANCE OP) [id number] Yes Place 1 drop into both eyes daily as needed (dry eyes). [provider]  Active Self  rosuvastatin (CRESTOR) 10 MG tablet [id number]  Yes TAKE 1 TABLET BY MOUTH EVERY DAY Steele Sizer, MD 09/24/2022  Active Self  sacubitril-valsartan (ENTRESTO) 49-51 MG [id number] Yes Take 1 tablet by mouth 2 (two) times daily. Alisa Graff, [place] 09/24/2022 Active Self  Sodium Chloride-Sodium Bicarb (NETI POT SINUS [place] NA) [id number] Yes Place 1 Dose into the nose at bedtime. [provider] 09/24/2022 Active Self  tiZANidine (ZANAFLEX) 2 MG tablet [id number] Yes TAKE 1-2 TABLETS (2-4 MG TOTAL) BY MOUTH AT BEDTIME. Steele Sizer, MD 09/24/2022 Active Self  Vitamin D, Ergocalciferol, (DRISDOL) 1.25 MG (50000 UT) CAPS capsule [id number] Yes Take 50,000 Units by mouth every Saturday. [provider] Past Week Active Self  Med List Note Lona Millard, RN 01/13/15 1037): No UDS needed No meds prescribed by [facility]            SDOH:  (Social Determinants of Health) assessments and interventions performed: Yes SDOH Interventions    Flowsheet Row Clinical Support from 04/26/2022 in [facility] from 12/20/2021 in [facility] from 04/20/2021 in [facility] from 02/09/2021 in [facility]  SDOH Interventions      Food Insecurity Interventions Intervention Not Indicated -- Intervention Not Indicated --  Housing Interventions Intervention Not Indicated -- Intervention Not Indicated --  Transportation Interventions Intervention Not Indicated Intervention Not Indicated Intervention Not Indicated --  Utilities Interventions Intervention Not Indicated -- -- --  Financial Strain Interventions Intervention Not Indicated Intervention Not Indicated Intervention Not Indicated Intervention Not Indicated  Physical Activity Interventions Intervention Not Indicated -- Intervention Not Indicated --  Stress Interventions Intervention Not Indicated -- Intervention Not Indicated --  Social Connections Interventions Intervention Not Indicated --  Intervention Not Indicated --       Medication Assistance: None required.  Patient affirms current coverage meets needs.  Medication Access: Within the past 30 days, how often has patient missed a dose of medication? None Is a pillbox or other method used to improve adherence? Yes  Factors that may affect medication adherence? no barriers identified Are meds synced by current pharmacy? No  Are meds delivered by current pharmacy? No  Does patient experience delays in picking up medications due to transportation concerns? No   [facility] Services Reviewed: Is patient disadvantaged to use [facility]?: No  Current Rx insurance plan: [facility] The Name and location of Current pharmacy:  [id number] [place], [address] [place] [address] Phone: [phone] Fax: [phone]  [facility] [place], [address] [place] [address] Phone: [phone] Fax: [phone]  [facility] [address] [place] [address] Phone: [phone] Fax: [phone]  [facility] services reviewed with patient today?: No  Patient requests to transfer care to [facility]?: No  Reason patient declined to change pharmacies: Loyalty to other pharmacy/Patient preference  Compliance/Adherence/Medication fill history: Care Gaps: Covid Booster Annual wellness visit in last year? Yes Last eye exam / retinopathy screening: 01/31/2022 Last diabetic foot exam: 07/19/2022  [facility] Drugs: Rosuvastatin 10 mg last filled on 06/20/2022 for a [facility]-Day supply with [facility]    Assessment/Plan  Diabetes (A1c goal <8%) -Controlled -Managed by Dr. Honor Junes  -Current medications: Bydureon 2 mg weekly  Jardiance 25 mg daily  Humalog 2-6 units three times daily She typically requires 4-6 units around twice daily.  Lantus 12 units nightly.   -  Medications previously tried: NA  -Patient was started on Mounjaro, but remains with Bydureon due to supply issues with getting Mounjaro.  -Current home glucose readings (Uses Freestyle Libre):  Fasting: 90-140s  -Denies hypoglycemic symptoms. -Continue current medications   Heart Failure (Goal: manage symptoms and prevent exacerbations) -Controlled -Last ejection fraction: 45-50% (Date: 01/23/21) -HF type: Combined Systolic and Diastolic -NYHA Class: II (slight limitation of activity) -AHA HF Stage: C (Heart disease and symptoms present) -Current treatment: Carvedilol 6.25 mg twice daily  Entresto 49-51 mg twice daily  Jardiance 25 mg daily  Furosemide 40 mg twice daily   -Medications previously tried: Metoprolol (Bradycardia), irbesartan, HCTZ, Telmisartan -Current home BP/HR readings: HR ranging between 48-70, reports BP 130s/70s   -Patient weighs daily, reports weight has been stable around 163   -Recommended to continue current medication  Atrial Fibrillation (Goal: prevent stroke and major bleeding) -Controlled -CHADSVASC: 6 -Current treatment: Rate control:  Amiodarone 100 mg tablet daily Carvedilol 6.25 mg twice daily  Anticoagulation: Eliquis 5 mg twice daily  -Medications previously tried: Flecainide,  -Patient underwent ablation which went well. She developed significant diarrhea after starting on colchicine. She is hopeful that she may be able to get off amiodarone in the future.  -Continue current medications  Hyperlipidemia: (LDL goal < 70) -Controlled -Current treatment: Rosuvastatin 10 mg daily  -Medications previously tried: NA  -Recommended to continue current medication  Osteoporosis (Goal Prevent bone fractures) -Controlled -Patient is a candidate for pharmacologic treatment due to T-Score < -2.5 in femoral neck -Current treatment  Ergocalciferol 50,000 units weekly Vitamin D3 1000 units daily Prolia 60 mg into the skin every 6 months   -Medications previously tried: NA  -Recommended to continue current medication  Malva Limes, [place] Pharmacist Practitioner  [facility] [phone]

## 2022-10-07 ENCOUNTER — Other Ambulatory Visit: Payer: Self-pay | Admitting: Family Medicine

## 2022-10-07 DIAGNOSIS — M62830 Muscle spasm of back: Secondary | ICD-10-CM

## 2022-10-09 DIAGNOSIS — Z23 Encounter for immunization: Secondary | ICD-10-CM | POA: Diagnosis not present

## 2022-10-10 ENCOUNTER — Other Ambulatory Visit: Payer: Self-pay | Admitting: Student

## 2022-10-15 DIAGNOSIS — M9903 Segmental and somatic dysfunction of lumbar region: Secondary | ICD-10-CM | POA: Diagnosis not present

## 2022-10-15 DIAGNOSIS — M9901 Segmental and somatic dysfunction of cervical region: Secondary | ICD-10-CM | POA: Diagnosis not present

## 2022-10-15 DIAGNOSIS — M5033 Other cervical disc degeneration, cervicothoracic region: Secondary | ICD-10-CM | POA: Diagnosis not present

## 2022-10-15 DIAGNOSIS — M5416 Radiculopathy, lumbar region: Secondary | ICD-10-CM | POA: Diagnosis not present

## 2022-10-17 ENCOUNTER — Other Ambulatory Visit
Admission: RE | Admit: 2022-10-17 | Discharge: 2022-10-17 | Disposition: A | Discharge status: Home / Self Care | Payer: Medicare Other | Source: Ambulatory Visit | Attending: Family | Admitting: Family

## 2022-10-17 ENCOUNTER — Ambulatory Visit (HOSPITAL_BASED_OUTPATIENT_CLINIC_OR_DEPARTMENT_OTHER): Payer: Medicare Other | Admitting: Family

## 2022-10-17 ENCOUNTER — Encounter: Payer: Self-pay | Admitting: Family

## 2022-10-17 VITALS — BP 106/51 | HR 65 | Wt 164.8 lb

## 2022-10-17 DIAGNOSIS — I480 Paroxysmal atrial fibrillation: Secondary | ICD-10-CM | POA: Diagnosis not present

## 2022-10-17 DIAGNOSIS — I5032 Chronic diastolic (congestive) heart failure: Secondary | ICD-10-CM | POA: Diagnosis not present

## 2022-10-17 DIAGNOSIS — E1169 Type 2 diabetes mellitus with other specified complication: Secondary | ICD-10-CM

## 2022-10-17 DIAGNOSIS — E785 Hyperlipidemia, unspecified: Secondary | ICD-10-CM

## 2022-10-17 DIAGNOSIS — I10 Essential (primary) hypertension: Secondary | ICD-10-CM | POA: Diagnosis not present

## 2022-10-17 LAB — BASIC METABOLIC PANEL
Anion gap: 11 (ref 5–15)
BUN: 25 mg/dL — ABNORMAL HIGH (ref 8–23)
CO2: 29 mmol/L (ref 22–32)
Calcium: 8.7 mg/dL — ABNORMAL LOW (ref 8.9–10.3)
Chloride: 101 mmol/L (ref 98–111)
Creatinine, Ser: 0.92 mg/dL (ref 0.44–1.00)
GFR, Estimated: >60 mL/min (ref >60)
Glucose, Bld: 120 mg/dL — ABNORMAL HIGH (ref 70–99)
Potassium: 3.6 mmol/L (ref 3.5–5.1)
Sodium: 141 mmol/L (ref 135–145)

## 2022-10-17 NOTE — Patient Instructions (Signed)
[facility] to [facility] to get your lab work drawn.  

## 2022-10-17 NOTE — Progress Notes (Signed)
Patient ID: Bonnie Rowland, female    DOB: October 27, 1940, 82 y.o.   MRN: [id number]  Primary cardiologist: Wallis Bamberg, NP (last seen 02/24) PCP: Alba Cory, MD (last seen 02/24) EP: Steffanie Dunn, MD (last seen 12/23)   Bonnie Rowland is a 82 y/o female with a history of HTN, DM, hyperlipidemia, atrial fibrillation, GERD and chronic heart failure.   Echo 03/31/21: EF of  60-65% without structural changes. Echo report from 01/23/21 reviewed and showed an EF of 45-50% with mild LVH, severe hypokinesis of left ventricle and trivial MR.   RHC/LHC done 01/25/21 and showed: Mild to moderate, non-obstructive coronary artery disease, including 30% mid LAD disease, 50-60% mid/distal LCx stenosis, and 60% proximal/mid RCA lesion. Mildly to moderately reduced left ventricular systolic function with mid anterior hypo/akinesis; query atypical Takotsubo variant.  LVEF ~45%. Mildly elevated left heart filling pressures. Moderately elevated right heart filling and pulmonary artery pressures with significant pulmonary vascular resistance. Mildly reduced Fick cardiac output/index.  Cardiac CT 09/18/22: Small foci of subsegmental atelectasis at the anterior lung bases and in LEFT lower lobe.  Has not been admitted or been in the ED in the last 6 months.   She presents today for a HF follow-up visit with a chief complaint of minimal fatigue with moderate exertion. Chronic in nature. Has decreased appetite, pedal edema and intermittent diarrhea along with this. Denies difficulty sleeping, abdominal distention, palpitations, chest pain, SOB, cough, dizziness or weight gain.   Had ablation done 03/24 and has done well other than she did develop significant diarrhea after starting colchicine which improved after stoppage of medication.   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; left shoulder blade   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   Cataract 2003   Cellulitis and abscess of leg, except foot    Conjunctivitis unspecified    Dermatophytosis of nail    Diabetes mellitus    type II   Elevated troponin    Esophageal reflux    Hyperlipidemia    Hypertension    Osteoporosis 2016   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)    Renal artery stenosis (HCC)    Sprain of lumbar region    Thoracic or lumbosacral neuritis or radiculitis, unspecified    Urinary tract infection, site not specified    Past Surgical History:  Procedure Laterality Date   APPENDECTOMY     ATRIAL FIBRILLATION ABLATION N/A 09/25/2022   Procedure: ATRIAL FIBRILLATION ABLATION;  Surgeon: Lanier Prude, MD;  Location: [facility];  Service: Cardiovascular;  Laterality: N/A;   CARDIAC CATHETERIZATION     CARDIOVERSION N/A 11/30/2020   Procedure: CARDIOVERSION;  Surgeon: Antonieta Iba, MD;  Location: [facility];  Service: Cardiovascular;  Laterality: N/A;   CARDIOVERSION N/A 01/20/2021   Procedure: CARDIOVERSION;  Surgeon: Antonieta Iba, MD;  Location: [facility];  Service: Cardiovascular;  Laterality: N/A;   CARDIOVERSION N/A 01/30/2021   Procedure: CARDIOVERSION;  Surgeon: Iran Ouch, MD;  Location: [facility]  ORS;  Service: Cardiovascular;  Laterality: N/A;   cataract surgery     COLONOSCOPY     COLONOSCOPY WITH PROPOFOL N/A 05/09/2016   Procedure: COLONOSCOPY WITH PROPOFOL;  Surgeon: Earline Mayotte, MD;  Location: [facility]OS[facility];  Service: Endoscopy;  Laterality: N/A;   DIAGNOSTIC MAMMOGRAM     EYE SURGERY Bilateral    Cataract Extraction   JOINT  REPLACEMENT  2016   KNEE ARTHROPLASTY Right 03/30/2015   Procedure: COMPUTER ASSISTED TOTAL KNEE ARTHROPLASTY;  Surgeon: Donato Heinz, MD;  Location: [facility] OR[facility];  Service: Orthopedics;  Laterality: Right;   KNEE ARTHROSCOPY Right    KNEE CLOSED REDUCTION Right 05/23/2015   Procedure: CLOSED MANIPULATION KNEE;  Surgeon: Donato Heinz, MD;  Location: [facility] OR[facility];  Service: Orthopedics;  Laterality: Right;   RIGHT/LEFT HEART CATH AND CORONARY ANGIOGRAPHY N/A 01/25/2021   Procedure: RIGHT/LEFT HEART CATH AND CORONARY ANGIOGRAPHY;  Surgeon: Yvonne Kendall, MD;  Location: [facility];  Service: Cardiovascular;  Laterality: N/A;   TONSILLECTOMY     Family History  Problem Relation Age of Onset   Diabetes Mother    Hypertension Mother    Cancer Mother    Kidney disease Mother    Hypertension Father    Cancer Father        Prostate   Breast cancer Maternal Aunt 54   Colon cancer Son    Kidney cancer Neg Hx    Bladder Cancer Neg Hx    Social History   Tobacco Use   Smoking status: Never   Smokeless tobacco: Never   Tobacco comments:    Smoking cessation materials not required  Substance Use Topics   Alcohol use: Not Currently    Alcohol/week: 1.0 standard drink of alcohol    Types: 1 Glasses of wine per week    Comment: RARELY   Allergies  Allergen Reactions   Flexeril [Cyclobenzaprine] Hypertension   Cheese Other (See Comments)    bloating   Ciprofloxacin Hcl Other (See Comments)    Muscle pain   Coconut (Cocos Nucifera)     Upset stomach   Keflex [Cephalexin] Other (See Comments)    Patient prefers not to take this medication due to the side effects, caused tendon issues   Prior to Admission medications   Medication Sig Start Date End Date Taking? Authorizing Provider  acetaminophen (TYLENOL) 650 MG CR tablet Take 1,300 mg by mouth at bedtime.   Yes [provider]  ALFALFA PO Take 1 tablet by mouth daily.   Yes [provider]  amiodarone  (PACERONE) 200 MG tablet Take 0.5 tablets (100 mg total) by mouth daily. 04/12/21  Yes Dunn, Raymon Mutton, [name]  amoxicillin (AMOXIL) 500 MG tablet Take 2,000 mg by mouth See admin instructions. Takes before dental procedures   Yes [provider]  ascorbic Acid (VITAMIN C) 500 MG CPCR Take 500 mg by mouth daily.   Yes [provider]  B Complex-C (B-COMPLEX WITH VITAMIN C) tablet Take 1 tablet by mouth daily.   Yes [provider]  BD PEN NEEDLE NANO U/F 32G X 4 MM MISC USE 4 TIMES DAILY (HUMALOG 3 TIMES DAILY AND LANTUS ONCE DAILY) OFFICE NOTIFIED 08/06/17 12/07/18  Yes Sowles, Danna Hefty, MD  BLACK ELDERBERRY,BERRY-FLOWER, PO Take 15 mLs by mouth daily. Liquid   Yes [provider]  Calcium Carbonate (CALCIUM 600 PO) Take 600 mg by mouth daily.   Yes [provider]  carvedilol (COREG) 6.25 MG tablet TAKE 1 TABLET BY  MOUTH 2 TIMES DAILY WITH A MEAL. 10/10/22  Yes Wittenborn, Gavin Pound, NP  cholecalciferol (VITAMIN D) 1000 UNITS tablet Take 1,000 Units by mouth daily. Shaklee   Yes [provider]  Coenzyme Q10 (COQ10) 200 MG CAPS Take 200 mg by mouth daily.   Yes [provider]  Continuous Blood Gluc Sensor (FREESTYLE LIBRE SENSOR SYSTEM) MISC by Does not apply route.   Yes [provider]  Elastic Bandages & Supports (MEDICAL COMPRESSION STOCKINGS) MISC 1 each by Does not apply route daily. 01/08/19  Yes Sowles, Danna Hefty, MD  ELIQUIS 5 MG TABS tablet TAKE 1 TABLET BY MOUTH TWICE A DAY 09/17/22  Yes Gollan, Tollie Pizza, MD  empagliflozin (JARDIANCE) 25 MG TABS tablet Take 25 mg by mouth daily. 08/26/22  Yes [provider]  estradiol (ESTRACE) 0.1 MG/GM vaginal cream APPLY 1/2 GM ONCE WEEKLY USING APPLICATOR, APPLY BLUEBERRY SIZED AMOUNT OF CREAM USING TIP OF FINGER TO URETHRA TWICE WEEKLY Patient taking differently: APPLY 1/2 GM ONCE WEEKLY USING APPLICATOR, APPLY BLUEBERRY SIZED AMOUNT OF CREAM USING TIP OF FINGER TO URETHRA THREE TIMES  WEEKLY 09/13/22  Yes McGowan, Carollee Herter A, [name]  furosemide (LASIX) 40 MG tablet TAKE 1 TABLET BY MOUTH TWICE A DAY 07/26/22  Yes Anis Degidio A, FNP  HUMALOG KWIKPEN 100 UNIT/ML KiwkPen Inject 2-6 Units into the skin 3 (three) times daily as needed (High blood sugar). 10/15/17  Yes [provider]  insulin glargine (LANTUS) 100 unit/mL SOPN Inject 12 Units into the skin at bedtime.   Yes [provider]  Boris Lown Oil 1000 MG CAPS Take 1,000 mg by mouth daily.   Yes [provider]  Mag Aspart-Potassium Aspart (POTASSIUM & MAGNESIUM ASPARTAT PO) Take 1 tablet by mouth daily.   Yes [provider]  Multiple Vitamins-Minerals (PRESERVISION AREDS 2) CHEW Chew 1 each by mouth 2 (two) times daily.   Yes [provider]  OVER THE COUNTER MEDICATION Place 1 application  into both eyes See admin instructions. Blephadex eyelid foam, apply to eyelids once daily   Yes [provider]  OVER THE COUNTER MEDICATION Take 1 tablet by mouth daily. Vita-Lea Gold otc supplement With out vitamin K (Shaklee products)   Yes [provider]  OVER THE COUNTER MEDICATION Take 1 tablet by mouth daily. Nutriferon otc supplement   Yes [provider]  pantoprazole (PROTONIX) 40 MG tablet Take 1 tablet (40 mg total) by mouth daily. 09/25/22 11/09/22 Yes TilleryMariam Dollar, [name]  Probiotic Product (PROBIOTIC PO) Take 1 capsule by mouth at bedtime.   Yes [provider]  PROLIA 60 MG/ML SOSY injection Inject 60 mg into the skin every 6 (six) months. 09/02/19  Yes [provider]  Propylene Glycol (SYSTANE BALANCE OP) Place 1 drop into both eyes daily as needed (dry eyes).   Yes [provider]  rosuvastatin (CRESTOR) 10 MG tablet TAKE 1 TABLET BY MOUTH EVERY DAY 09/17/22  Yes Sowles, Danna Hefty, MD  sacubitril-valsartan (ENTRESTO) 49-51 MG Take 1 tablet by mouth 2 (two) times daily. 10/11/21  Yes Giovanie Lefebre, Inetta Fermo A, FNP  Sodium Chloride-Sodium  Bicarb (NETI POT SINUS WASH NA) Place 1 Dose into the nose at bedtime.   Yes [provider]  tirzepatide Greggory Keen) 10 MG/0.5ML Pen Inject 10 mg into the skin once a week. 08/23/22  Yes [provider]  tiZANidine (ZANAFLEX) 2 MG tablet TAKE 1-2 TABLETS (2-4 MG TOTAL) BY MOUTH AT BEDTIME. 10/10/22  Yes Sowles, Danna Hefty, MD  Vitamin D, Ergocalciferol, (DRISDOL)  1.25 MG (50000 UT) CAPS capsule Take 50,000 Units by mouth every Saturday. 11/23/18  Yes [provider]  doxycycline (VIBRA-TABS) 100 MG tablet Take 1 tablet (100 mg total) by mouth 2 (two) times daily. 01/16/22   Helane Gunther, DPM   Review of Systems  Constitutional:  Positive for appetite change (decreased) and fatigue.  HENT:  Negative for congestion, postnasal drip and sore throat.   Eyes: Negative.   Respiratory:  Negative for cough, chest tightness and shortness of breath.   Cardiovascular:  Positive for leg swelling. Negative for chest pain and palpitations.  Gastrointestinal:  Positive for diarrhea (at times although much better). Negative for abdominal distention and abdominal pain.  Endocrine: Negative.   Genitourinary: Negative.   Musculoskeletal:  Positive for arthralgias (right shoulder). Negative for back pain.  Skin: Negative.   Allergic/Immunologic: Negative.   Neurological:  Negative for dizziness and light-headedness.  Hematological:  Negative for adenopathy. Does not bruise/bleed easily.  Psychiatric/Behavioral:  Negative for dysphoric mood and sleep disturbance (sleeping in recliner). The patient is not nervous/anxious.    Vitals:   10/17/22 1104  BP: (!) 106/51  Pulse: 65  SpO2: 99%  Weight: 164 lb 12.8 oz (74.8 kg)   Wt Readings from Last 3 Encounters:  10/17/22 164 lb 12.8 oz (74.8 kg)  09/25/22 169 lb (76.7 kg)  08/16/22 171 lb (77.6 kg)   Lab Results  Component Value Date   CREATININE 0.92 09/17/2022   CREATININE 0.86 08/16/2022   CREATININE 1.03 (H) 04/11/2022    Physical Exam Vitals and nursing note reviewed.  Constitutional:      General: She is not in acute distress.    Appearance: Normal appearance.  HENT:     Head: Normocephalic and atraumatic.  Cardiovascular:     Rate and Rhythm: Normal rate and regular rhythm.  Pulmonary:     Effort: Pulmonary effort is normal. No respiratory distress.     Breath sounds: No wheezing or rales.  Abdominal:     General: There is no distension.     Palpations: Abdomen is soft.  Musculoskeletal:        General: No tenderness.     Cervical back: Normal range of motion and neck supple.     Right lower leg: Edema (trace pitting) present.     Left lower leg: Edema (trace) present.  Skin:    General: Skin is warm and dry.  Neurological:     General: No focal deficit present.     Mental Status: She is alert and oriented to person, place, and time.  Psychiatric:        Mood and Affect: Mood normal.        Behavior: Behavior normal.        Thought Content: Thought content normal.    Assessment & Plan:  1: NICM with preserved ejection fraction- - NYHA class II - euvolemic today - weighing daily; reminded to call for an overnight weight gain of > 2 pounds or a weekly weight gain of > 5 pounds.  - weight down 10 pounds from her last visit here 6 months ago - Echo 03/31/21: EF of  60-65% without structural changes. Echo report from 01/23/21 reviewed and showed an EF of 45-50% with mild LVH, severe hypokinesis of left ventricle and trivial MR.  - RHC/LHC done 01/25/21; Mild to moderate, non-obstructive coronary artery disease, including 30% mid LAD disease, 50-60% mid/distal LCx stenosis, and 60% proximal/mid RCA lesion. Mildly to moderately reduced left ventricular systolic  function with mid anterior hypo/akinesis; query atypical Takotsubo variant.  LVEF ~45%. Mildly elevated left heart filling pressures. Moderately elevated right heart filling and pulmonary artery pressures with significant pulmonary vascular  resistance. Mildly reduced Fick cardiac output/index. - is trying to walk between 3000-5000 steps daily - not adding salt and has been reading food labels - saw cardiology Elliot Gurney) 02/24 - continue entresto 49/51 BID - continue jardiance  daily - continue carvedilol 6.25mg  BID - continue furosemide  BID - wearing compression socks daily and removes HS - BMP today - BNP 04/11/22 was 347.5  2: HTN- - BP 106/51 - saw PCP Carlynn Purl) 02/24 - BMP 09/17/22 reviewed and showed sodium 139, potassium 4.0, creatinine 0.92  & GFR >60  3: DM- - A1c 08/23/22 was 6.9% - saw endocrinology Gershon Crane) 02/24 - nonfasting glucose at home this morning was 133  4: Paroxysmal atrial fibrillation- - unsuccessfully cardioverted twice 10/23 - saw EP Lalla Brothers) 12/23 - continue eliquis  BID - continue amiodarone  daily - ablation done 03/24 - going to AF clinic next week so will defer doing an EKG today as they will do one next week   Return in 4 months, sooner if needed

## 2022-10-18 ENCOUNTER — Encounter: Payer: Self-pay | Admitting: Podiatry

## 2022-10-18 ENCOUNTER — Ambulatory Visit (INDEPENDENT_AMBULATORY_CARE_PROVIDER_SITE_OTHER): Payer: Medicare Other | Admitting: Podiatry

## 2022-10-18 VITALS — BP 149/52 | HR 63

## 2022-10-18 DIAGNOSIS — E119 Type 2 diabetes mellitus without complications: Secondary | ICD-10-CM | POA: Diagnosis not present

## 2022-10-18 DIAGNOSIS — Q828 Other specified congenital malformations of skin: Secondary | ICD-10-CM

## 2022-10-18 NOTE — Progress Notes (Signed)
This patient presents to the office saying she has developed pain on the outside of her right midfoot.  She has been walking for exercise and her pain is worsening.  She says she has painful callus. She was dispensed padding for her to wear in her shoes which have worked well.  She requests additional padding. She presents for evaluation and treatment.  General Appearance  Alert, conversant and in no acute stress.  Vascular  Dorsalis pedis and posterior tibial  pulses are palpable  bilaterally.  Capillary return is within normal limits  bilaterally. Temperature is within normal limits  bilaterally.  Neurologic  Senn-Weinstein monofilament wire test within normal limits  bilaterally. Muscle power within normal limits bilaterally.  Nails Thick disfigured discolored nails with subungual debris  from hallux to fifth toes bilaterally. No evidence of bacterial infection or drainage bilaterally.  Orthopedic  No limitations of motion  feet .  No crepitus or effusions noted.  No bony pathology or digital deformities noted.  Palpable pain fifth metabase right foot.  Skin  normotropic skin with no porokeratosis noted bilaterally.  No signs of infections or ulcers noted.   Porokeratosis sub 5th right and sub 5th left  Porokeratosis B/L.    Discussed this condition with this patient.  Debrided porokeratosis B/L with # 15 blade.   RTC 3 months   Helane Gunther DPM

## 2022-10-23 ENCOUNTER — Ambulatory Visit (HOSPITAL_COMMUNITY)
Admission: RE | Admit: 2022-10-23 | Discharge: 2022-10-23 | Disposition: A | Discharge status: Home / Self Care | Payer: Medicare Other | Source: Ambulatory Visit | Attending: Physician Assistant | Admitting: Physician Assistant

## 2022-10-23 ENCOUNTER — Encounter (HOSPITAL_COMMUNITY): Payer: Self-pay | Admitting: Physician Assistant

## 2022-10-23 VITALS — BP 116/60 | HR 56 | Ht 63.0 in | Wt 163.0 lb

## 2022-10-23 DIAGNOSIS — E785 Hyperlipidemia, unspecified: Secondary | ICD-10-CM | POA: Diagnosis not present

## 2022-10-23 DIAGNOSIS — I4819 Other persistent atrial fibrillation: Secondary | ICD-10-CM | POA: Diagnosis not present

## 2022-10-23 DIAGNOSIS — I5032 Chronic diastolic (congestive) heart failure: Secondary | ICD-10-CM | POA: Diagnosis not present

## 2022-10-23 DIAGNOSIS — E119 Type 2 diabetes mellitus without complications: Secondary | ICD-10-CM | POA: Diagnosis not present

## 2022-10-23 DIAGNOSIS — Z5181 Encounter for therapeutic drug level monitoring: Secondary | ICD-10-CM | POA: Diagnosis not present

## 2022-10-23 DIAGNOSIS — I110 Hypertensive heart disease with heart failure: Secondary | ICD-10-CM | POA: Diagnosis not present

## 2022-10-23 DIAGNOSIS — Z7901 Long term (current) use of anticoagulants: Secondary | ICD-10-CM | POA: Insufficient documentation

## 2022-10-23 DIAGNOSIS — Z79899 Other long term (current) drug therapy: Secondary | ICD-10-CM | POA: Insufficient documentation

## 2022-10-23 DIAGNOSIS — I4892 Unspecified atrial flutter: Secondary | ICD-10-CM | POA: Insufficient documentation

## 2022-10-23 DIAGNOSIS — D6869 Other thrombophilia: Secondary | ICD-10-CM | POA: Insufficient documentation

## 2022-10-23 NOTE — Progress Notes (Signed)
Primary Care Physician: Alba Cory, MD Primary Cardiologist: Dr Mariah Milling Primary Electrophysiologist: Dr Lalla Brothers Referring Physician: Dr Luther Parody is a 82 y.o. female with a history of HTN, chronic diastolic CHF, DM, renal artery stenosis, HLD, atrial flutter, atrial fibrillation who presents for follow up in the [facility].  She was hospitalized in October 2023 with chest discomfort and palpitations.  She was found to be in atrial fibrillation with rapid ventricular rates.  She was cardioverted in the emergency department back to sinus rhythm but had ERAF.  She was sedated again during that ER visit and another cardioversion attempt was performed but was unsuccessful. She was seen by Dr Lalla Brothers and underwent afib and flutter ablation on 09/25/22. Patient is on Eliquis for a CHADS2VASC score of 7.  On follow up today, patient reports that she has done well since her ablation. She remains in SR today. She did have some diarrhea with colchicine but this was discontinued and her symptoms resolved. She denies chest pain, swallowing pain, or groin issues.   Today, she denies symptoms of palpitations, chest pain, shortness of breath, orthopnea, PND, lower extremity edema, dizziness, presyncope, syncope, snoring, daytime somnolence, bleeding, or neurologic sequela. The patient is tolerating medications without difficulties and is otherwise without complaint today.    Atrial Fibrillation Risk Factors:  she does not have symptoms or diagnosis of sleep apnea. she does not have a history of rheumatic fever.   she has a BMI of Body mass index is 28.87 kg/m.Marland Kitchen Filed Weights   10/23/22 1049  Weight: 73.9 kg    Family History  Problem Relation Age of Onset   Diabetes Mother    Hypertension Mother    Cancer Mother    Kidney disease Mother    Hypertension Father    Cancer Father        Prostate   Breast cancer Maternal Aunt 65   Colon cancer  Son    Kidney cancer Neg Hx    Bladder Cancer Neg Hx      Atrial Fibrillation Management history:  Previous antiarrhythmic drugs: amiodarone  Previous cardioversions: 2022 x 3, 04/2022 Previous ablations: 09/25/22 Anticoagulation history: Eliquis    Past Medical History:  Diagnosis Date   Acute cystitis    Acute on chronic combined systolic and diastolic CHF (congestive heart failure)    Acute respiratory failure with hypoxia 01/23/2021   AF (paroxysmal atrial fibrillation) 01/23/2021   Allergic rhinitis, cause unspecified    Arthritis 2015   Atherosclerosis of renal artery    left   Atrial fibrillation    Bronchitis, not specified as acute or chronic    Cancer 12/2013   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; left shoulder blade   Cancer 05/2014   basal cell removed left temple   Cataract 2003   Cellulitis and abscess of leg, except foot    Conjunctivitis unspecified    Dermatophytosis of nail    Diabetes mellitus    type II   Elevated troponin    Esophageal reflux    Hyperlipidemia    Hypertension    Osteoporosis 2016   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)    Renal artery stenosis    Sprain of lumbar region    Thoracic or lumbosacral neuritis or radiculitis, unspecified    Urinary tract infection, site not specified    Past Surgical History:  Procedure Laterality Date   APPENDECTOMY     ATRIAL FIBRILLATION ABLATION N/A 09/25/2022   Procedure: ATRIAL  FIBRILLATION ABLATION;  Surgeon: Lanier Prude, MD;  Location: [facility] INVASIVE CV LAB;  Service: Cardiovascular;  Laterality: N/A;   CARDIAC CATHETERIZATION     CARDIOVERSION N/A 11/30/2020   Procedure: CARDIOVERSION;  Surgeon: Antonieta Iba, MD;  Location: [facility] ORS;  Service: Cardiovascular;  Laterality: N/A;   CARDIOVERSION N/A 01/20/2021   Procedure: CARDIOVERSION;  Surgeon: Antonieta Iba, MD;  Location: [facility] ORS;  Service: Cardiovascular;  Laterality: N/A;   CARDIOVERSION N/A 01/30/2021   Procedure: CARDIOVERSION;  Surgeon: Iran Ouch, MD;  Location: [facility] ORS;  Service: Cardiovascular;  Laterality: N/A;   cataract surgery     COLONOSCOPY     COLONOSCOPY WITH PROPOFOL N/A 05/09/2016   Procedure: COLONOSCOPY WITH PROPOFOL;  Surgeon: Earline Mayotte, MD;  Location: [facility] ENDOSCOPY;  Service: Endoscopy;  Laterality: N/A;   DIAGNOSTIC MAMMOGRAM     EYE SURGERY Bilateral    Cataract Extraction   JOINT REPLACEMENT  2016   KNEE ARTHROPLASTY Right 03/30/2015   Procedure: COMPUTER ASSISTED TOTAL KNEE ARTHROPLASTY;  Surgeon: Donato Heinz, MD;  Location: [facility] ORS;  Service: Orthopedics;  Laterality: Right;   KNEE ARTHROSCOPY Right    KNEE CLOSED REDUCTION Right 05/23/2015   Procedure: CLOSED MANIPULATION KNEE;  Surgeon: Donato Heinz, MD;  Location: [facility] ORS;  Service: Orthopedics;  Laterality: Right;   RIGHT/LEFT HEART CATH AND CORONARY ANGIOGRAPHY N/A 01/25/2021   Procedure: RIGHT/LEFT HEART CATH AND CORONARY ANGIOGRAPHY;  Surgeon: Yvonne Kendall, MD;  Location: [facility] INVASIVE CV LAB;  Service: Cardiovascular;  Laterality: N/A;   TONSILLECTOMY      Current Outpatient Medications  Medication Sig Dispense Refill   acetaminophen (TYLENOL) 650 MG CR tablet Take 1,300 mg by mouth at bedtime.     ALFALFA PO Take 1 tablet by mouth daily.     amiodarone (PACERONE) 200 MG tablet Take 0.5 tablets (100 mg total) by mouth daily. 90 tablet 3   amoxicillin (AMOXIL) 500 MG tablet Take 2,000 mg by mouth See admin instructions. Takes before dental procedures     ascorbic Acid (VITAMIN C) 500 MG CPCR Take 500 mg by mouth daily.     B Complex-C (B-COMPLEX WITH VITAMIN C) tablet Take 1 tablet by mouth daily.     BD PEN NEEDLE NANO U/F 32G X 4 MM MISC USE 4 TIMES DAILY (HUMALOG 3 TIMES DAILY AND LANTUS ONCE DAILY) OFFICE NOTIFIED 08/06/17 90 each 1   BLACK ELDERBERRY,BERRY-FLOWER, PO Take 15 mLs by mouth daily. Liquid     Calcium Carbonate (CALCIUM 600 PO) Take 600 mg by mouth daily.     carvedilol (COREG) 6.25 MG tablet TAKE 1 TABLET  BY MOUTH 2 TIMES DAILY WITH A MEAL. 180 tablet 0   cholecalciferol (VITAMIN D) 1000 UNITS tablet Take 1,000 Units by mouth daily. Shaklee     Coenzyme Q10 (COQ10) 200 MG CAPS Take 200 mg by mouth daily.     Continuous Blood Gluc Sensor (FREESTYLE LIBRE SENSOR SYSTEM) MISC by Does not apply route.     Elastic Bandages & Supports (MEDICAL COMPRESSION STOCKINGS) MISC 1 each by Does not apply route daily. 2 each 5   ELIQUIS 5 MG TABS tablet TAKE 1 TABLET BY MOUTH TWICE A DAY 180 tablet 1   empagliflozin (JARDIANCE) 25 MG TABS tablet Take 25 mg by mouth daily.     estradiol (ESTRACE) 0.1 MG/GM vaginal cream APPLY 1/2 GM ONCE WEEKLY USING APPLICATOR, APPLY BLUEBERRY SIZED AMOUNT OF CREAM USING TIP OF FINGER TO URETHRA TWICE WEEKLY (  Patient taking differently: APPLY 1/2 GM ONCE WEEKLY USING APPLICATOR, APPLY BLUEBERRY SIZED AMOUNT OF CREAM USING TIP OF FINGER TO URETHRA THREE TIMES WEEKLY) 42.5 g 3   furosemide (LASIX) 40 MG tablet TAKE 1 TABLET BY MOUTH TWICE A DAY 180 tablet 3   HUMALOG KWIKPEN 100 UNIT/ML KiwkPen Inject 2-6 Units into the skin 3 (three) times daily as needed (High blood sugar).  3   insulin glargine (LANTUS) 100 unit/mL SOPN Inject 12 Units into the skin at bedtime.     Krill Oil 1000 MG CAPS Take 1,000 mg by mouth daily.     Mag Aspart-Potassium Aspart (POTASSIUM & MAGNESIUM ASPARTAT PO) Take 1 tablet by mouth daily.     Multiple Vitamins-Minerals (PRESERVISION AREDS 2) CHEW Chew 1 each by mouth 2 (two) times daily.     OVER THE COUNTER MEDICATION Place 1 application  into both eyes See admin instructions. Blephadex eyelid foam, apply to eyelids once daily     OVER THE COUNTER MEDICATION Take 1 tablet by mouth daily. Vita-Lea Gold otc supplement With out vitamin K (Shaklee products)     OVER THE COUNTER MEDICATION Take 1 tablet by mouth daily. Nutriferon otc supplement     pantoprazole (PROTONIX) 40 MG tablet Take 1 tablet (40 mg total) by mouth daily. 45 tablet 0   Probiotic Product  (PROBIOTIC PO) Take 1 capsule by mouth at bedtime.     PROLIA 60 MG/ML SOSY injection Inject 60 mg into the skin every 6 (six) months.     Propylene Glycol (SYSTANE BALANCE OP) Place 1 drop into both eyes daily as needed (dry eyes).     rosuvastatin (CRESTOR) 10 MG tablet TAKE 1 TABLET BY MOUTH EVERY DAY 90 tablet 1   sacubitril-valsartan (ENTRESTO) 49-51 MG Take 1 tablet by mouth 2 (two) times daily. 180 tablet 3   Sodium Chloride-Sodium Bicarb (NETI POT SINUS WASH NA) Place 1 Dose into the nose at bedtime.     tirzepatide (MOUNJARO) 10 MG/0.5ML Pen Inject 10 mg into the skin once a week.     tiZANidine (ZANAFLEX) 2 MG tablet TAKE 1-2 TABLETS (2-4 MG TOTAL) BY MOUTH AT BEDTIME. 110 tablet 0   Vitamin D, Ergocalciferol, (DRISDOL) 1.25 MG (50000 UT) CAPS capsule Take 50,000 Units by mouth every Saturday.     No current facility-administered medications for this encounter.    Allergies  Allergen Reactions   Flexeril [Cyclobenzaprine] Hypertension   Cheese Other (See Comments)    bloating   Ciprofloxacin Hcl Other (See Comments)    Muscle pain   Coconut (Cocos Nucifera)     Upset stomach   Keflex [Cephalexin] Other (See Comments)    Patient prefers not to take this medication due to the side effects, caused tendon issues    Social History   Socioeconomic History   Marital status: Married    Spouse name: Camera operator   Number of children: 1   Years of education: Not on file   Highest education level: Master's degree (e.g., MA, MS, MEng, MEd, MSW, MBA)  Occupational History   Occupation: Retired  Tobacco Use   Smoking status: Never   Smokeless tobacco: Never   Tobacco comments:    Never smoke 10/23/22  Vaping Use   Vaping Use: Never used  Substance and Sexual Activity   Alcohol use: Not Currently    Alcohol/week: 1.0 standard drink of alcohol    Types: 1 Glasses of wine per week    Comment: RARELY   Drug use:  No   Sexual activity: Yes    Partners: Male    Birth  control/protection: Post-menopausal  Other Topics Concern   Not on file  Social History Narrative   She is married , only has one son and he was diagnosed with colon cancer at age 39.    Social Determinants of Health   Financial Resource Strain: Low Risk  (04/26/2022)   Overall Financial Resource Strain (CARDIA)    Difficulty of Paying Living Expenses: Not hard at all  Food Insecurity: No Food Insecurity (04/26/2022)   Hunger Vital Sign    Worried About Running Out of Food in the Last Year: Never true    Ran Out of Food in the Last Year: Never true  Transportation Needs: No Transportation Needs (04/26/2022)   PRAPARE - Administrator, Civil Service (Medical): No    Lack of Transportation (Non-Medical): No  Physical Activity: Sufficiently Active (04/26/2022)   Exercise Vital Sign    Days of Exercise per Week: 7 days    Minutes of Exercise per Session: 30 min  Stress: No Stress Concern Present (04/26/2022)   [employer] of Occupational Health - Occupational Stress Questionnaire    Feeling of Stress : Not at all  Social Connections: Socially Integrated (04/26/2022)   Social Connection and Isolation Panel [NHANES]    Frequency of Communication with Friends and Family: More than three times a week    Frequency of Social Gatherings with Friends and Family: More than three times a week    Attends Religious Services: More than 4 times per year    Active Member of [employer] or Organizations: Yes    Attends Engineer, structural: More than 4 times per year    Marital Status: Married  Catering manager Violence: Not At Risk (04/26/2022)   Humiliation, Afraid, Rape, and Kick questionnaire    Fear of Current or Ex-Partner: No    Emotionally Abused: No    Physically Abused: No    Sexually Abused: No     ROS- All systems are reviewed and negative except as per the HPI above.  Physical Exam: Vitals:   10/23/22 1049  BP: 116/60  Pulse: (!) 56  Weight: 73.9 kg   Height: 5\' 3"  (1.6 m)    GEN- The patient is a well appearing female, alert and oriented x 3 today.   Head- normocephalic, atraumatic Eyes-  Sclera clear, conjunctiva pink Ears- hearing intact Oropharynx- clear Neck- supple  Lungs- Clear to ausculation bilaterally, normal work of breathing Heart- Regular rate and rhythm, no murmurs, rubs or gallops  GI- soft, NT, ND, + BS Extremities- no clubbing, cyanosis, or edema MS- no significant deformity or atrophy Skin- no rash or lesion Psych- euthymic mood, full affect Neuro- strength and sensation are intact  Wt Readings from Last 3 Encounters:  10/23/22 73.9 kg  10/17/22 74.8 kg  09/25/22 76.7 kg    EKG today demonstrates  SB Vent. rate 56 BPM PR interval 160 ms QRS duration 74 ms QT/QTcB 452/436 ms  Echo 03/31/21 demonstrated  1. Left ventricular ejection fraction, by estimation, is 60 to 65%. The  left ventricle has normal function. The left ventricle has no regional  wall motion abnormalities. Left ventricular diastolic parameters are  indeterminate.   2. Right ventricular systolic function is normal. The right ventricular  size is normal. Tricuspid regurgitation signal is inadequate for assessing  PA pressure.   3. The mitral valve is normal in structure. No evidence of mitral  valve  regurgitation. No evidence of mitral stenosis. Moderate mitral annular  calcification.   4. The aortic valve is normal in structure. Aortic valve regurgitation is  not visualized. No aortic stenosis is present.   5. The inferior vena cava is normal in size with greater than 50%  respiratory variability, suggesting right atrial pressure of 3 mmHg.   Epic records are reviewed at length today.  CHA2DS2-VASc Score = 7  The patient's score is based upon: CHF History: 1 HTN History: 1 Diabetes History: 1 Stroke History: 0 Vascular Disease History: 1 Age Score: 2 Gender Score: 1       ASSESSMENT AND PLAN: 1. Persistent Atrial  Fibrillation/atrial flutter The patient's CHA2DS2-VASc score is 7, indicating a 11.2% annual risk of stroke.   S/p afib and flutter ablation 09/25/22 Patient appears to be maintaining SR. Continue amiodarone 100 mg daily for now Continue carvedilol 6.25 mg BID Continue Eliquis 5 mg BID with no missed doses for 3 months post ablation.   2. Secondary Hypercoagulable State (ICD10:  D68.69) The patient is at significant risk for stroke/thromboembolism based upon her CHA2DS2-VASc Score of 7.  Continue Apixaban (Eliquis).   3. Chronic HFpEF Fluid status appears stable today.  4. HTN Stable, no changes today.    Follow up with Dr Lalla Brothers as scheduled.    Jorja Loa [name] [facility] [address] [place], [place] [address] [phone] 10/23/2022 10:54 AM

## 2022-11-05 DIAGNOSIS — M9903 Segmental and somatic dysfunction of lumbar region: Secondary | ICD-10-CM | POA: Diagnosis not present

## 2022-11-05 DIAGNOSIS — M9901 Segmental and somatic dysfunction of cervical region: Secondary | ICD-10-CM | POA: Diagnosis not present

## 2022-11-05 DIAGNOSIS — M5416 Radiculopathy, lumbar region: Secondary | ICD-10-CM | POA: Diagnosis not present

## 2022-11-05 DIAGNOSIS — M5033 Other cervical disc degeneration, cervicothoracic region: Secondary | ICD-10-CM | POA: Diagnosis not present

## 2022-11-07 ENCOUNTER — Other Ambulatory Visit: Payer: Self-pay | Admitting: Family Medicine

## 2022-11-07 DIAGNOSIS — M62830 Muscle spasm of back: Secondary | ICD-10-CM

## 2022-11-08 ENCOUNTER — Telehealth: Payer: Self-pay

## 2022-11-08 NOTE — Progress Notes (Addendum)
Care Management & Coordination Services Pharmacy Team Pharmacy Assistant   Name: Bonnie Rowland  MRN: [id number] DOB: January 30, 1941  Reason for Encounter: Diabetes  Contacted patient to discuss diabetes disease state. Spoke with patient on 11/08/2022   Chart Updates:  Recent office visits:  None ID  Recent consult visits:  10/23/2022 Alphonzo Severance, PA (Cardiology) for A-FIB- No medication changes noted, EKG order placed, patient to follow-up as scheduled.  10/18/2022 Helane Gunther, DPM (Podiatry) for [place]- No medication changes noted, No orders placed, BP: 149/52, Patient to follow-up in 10 weeks  10/17/2022 Clarisa Kindred, NP (CHF Clinic) for Follow-up- No medication changes noted, Lab orders placed, BP: 106/51, Patient to follow-up in 4 months  Hospital visits:  None in previous 6 months  Medications: Outpatient Encounter Medications as of 11/08/2022  Medication Sig   acetaminophen (TYLENOL) 650 MG CR tablet Take 1,300 mg by mouth at bedtime.   ALFALFA PO Take 1 tablet by mouth daily.   amiodarone (PACERONE) 200 MG tablet Take 0.5 tablets (100 mg total) by mouth daily.   amoxicillin (AMOXIL) 500 MG tablet Take 2,000 mg by mouth See admin instructions. Takes before dental procedures   ascorbic Acid (VITAMIN C) 500 MG CPCR Take 500 mg by mouth daily.   B Complex-C (B-COMPLEX WITH VITAMIN C) tablet Take 1 tablet by mouth daily.   BD PEN NEEDLE NANO U/F 32G X 4 MM MISC USE 4 TIMES DAILY (HUMALOG 3 TIMES DAILY AND LANTUS ONCE DAILY) OFFICE NOTIFIED 08/06/17   BLACK ELDERBERRY,BERRY-FLOWER, PO Take 15 mLs by mouth daily. Liquid   Calcium Carbonate (CALCIUM 600 PO) Take 600 mg by mouth daily.   carvedilol (COREG) 6.25 MG tablet TAKE 1 TABLET BY MOUTH 2 TIMES DAILY WITH A MEAL.   cholecalciferol (VITAMIN D) 1000 UNITS tablet Take 1,000 Units by mouth daily. Shaklee   Coenzyme Q10 (COQ10) 200 MG CAPS Take 200 mg by mouth daily.   Continuous Blood Gluc Sensor (FREESTYLE LIBRE SENSOR  SYSTEM) MISC by Does not apply route.   Elastic Bandages & Supports (MEDICAL COMPRESSION STOCKINGS) MISC 1 each by Does not apply route daily.   ELIQUIS 5 MG TABS tablet TAKE 1 TABLET BY MOUTH TWICE A DAY   empagliflozin (JARDIANCE) 25 MG TABS tablet Take 25 mg by mouth daily.   estradiol (ESTRACE) 0.1 MG/GM vaginal cream APPLY 1/2 GM ONCE WEEKLY USING APPLICATOR, APPLY BLUEBERRY SIZED AMOUNT OF CREAM USING TIP OF FINGER TO URETHRA TWICE WEEKLY (Patient taking differently: APPLY 1/2 GM ONCE WEEKLY USING APPLICATOR, APPLY BLUEBERRY SIZED AMOUNT OF CREAM USING TIP OF FINGER TO URETHRA THREE TIMES WEEKLY)   furosemide (LASIX) 40 MG tablet TAKE 1 TABLET BY MOUTH TWICE A DAY   HUMALOG KWIKPEN 100 UNIT/ML KiwkPen Inject 2-6 Units into the skin 3 (three) times daily as needed (High blood sugar).   insulin glargine (LANTUS) 100 unit/mL SOPN Inject 12 Units into the skin at bedtime.   Krill Oil 1000 MG CAPS Take 1,000 mg by mouth daily.   Mag Aspart-Potassium Aspart (POTASSIUM & MAGNESIUM ASPARTAT PO) Take 1 tablet by mouth daily.   Multiple Vitamins-Minerals (PRESERVISION AREDS 2) CHEW Chew 1 each by mouth 2 (two) times daily.   OVER THE COUNTER MEDICATION Place 1 application  into both eyes See admin instructions. Blephadex eyelid foam, apply to eyelids once daily   OVER THE COUNTER MEDICATION Take 1 tablet by mouth daily. Vita-Lea Gold otc supplement With out vitamin K (Shaklee products)   OVER THE COUNTER MEDICATION Take 1  tablet by mouth daily. Nutriferon otc supplement   pantoprazole (PROTONIX) 40 MG tablet Take 1 tablet (40 mg total) by mouth daily.   Probiotic Product (PROBIOTIC PO) Take 1 capsule by mouth at bedtime.   PROLIA 60 MG/ML SOSY injection Inject 60 mg into the skin every 6 (six) months.   Propylene Glycol (SYSTANE BALANCE OP) Place 1 drop into both eyes daily as needed (dry eyes).   rosuvastatin (CRESTOR) 10 MG tablet TAKE 1 TABLET BY MOUTH EVERY DAY   sacubitril-valsartan (ENTRESTO)  49-51 MG Take 1 tablet by mouth 2 (two) times daily.   Sodium Chloride-Sodium Bicarb (NETI POT SINUS WASH NA) Place 1 Dose into the nose at bedtime.   tirzepatide (MOUNJARO) 10 MG/0.5ML Pen Inject 10 mg into the skin once a week.   tiZANidine (ZANAFLEX) 2 MG tablet TAKE 1-2 TABLETS (2-4 MG TOTAL) BY MOUTH AT BEDTIME.   Vitamin D, Ergocalciferol, (DRISDOL) 1.25 MG (50000 UT) CAPS capsule Take 50,000 Units by mouth every Saturday.   [DISCONTINUED] doxycycline (VIBRA-TABS) 100 MG tablet Take 1 tablet (100 mg total) by mouth 2 (two) times daily.   No facility-administered encounter medications on file as of 11/08/2022.    Recent Relevant Labs: Lab Results  Component Value Date/Time   HGBA1C 6.4 (H) 04/12/2022 01:01 PM   HGBA1C 6.7 09/21/2021 12:00 AM   HGBA1C 7.1 03/23/2021 12:00 AM   MICROALBUR <0.2 01/09/2022 08:49 AM   MICROALBUR Negative 08/21/2019 12:00 AM   MICROALBUR 0 02/18/2017 09:01 AM   MICROALBUR 50 04/23/2016 10:37 AM    Kidney Function Lab Results  Component Value Date/Time   CREATININE 0.92 10/17/2022 11:55 AM   CREATININE 0.92 09/17/2022 11:09 AM   CREATININE 0.86 08/16/2022 09:42 AM   CREATININE 0.95 01/09/2022 08:49 AM   GFRNONAA >60 10/17/2022 11:55 AM   GFRNONAA 88 04/11/2018 10:32 AM   GFRAA 102 04/11/2018 10:32 AM  Current antihyperglycemic regimen:  Bydureon 2 mg weekly  Jardiance 25 mg daily  Humalog 2-6 units three times daily She typically requires 4-6 units around twice daily.  Lantus 12 units nightly.     Patient verbally confirms she is taking the above medications as directed. Yes  What diet changes have been made to improve diabetes control? None ID  What recent interventions/DTPs have been made to improve glycemic control:  None ID  Have there been any recent hospitalizations or ED visits since last visit with PharmD? No  Patient denies hypoglycemic symptoms, including Pale, Sweaty, Shaky, Hungry, Nervous/irritable, and Vision  changes  Patient denies hyperglycemic symptoms, including blurry vision, excessive thirst, fatigue, polyuria, and weakness  How often are you checking your blood sugar? Patient has a freestyle Libre  What are your blood sugars ranging?  90-140  During the week, how often does your blood glucose drop below 70? Never  Are you checking your feet daily/regularly? Yes  Adherence Review: Is the patient currently on a STATIN medication? Yes Is the patient currently on ACE/ARB medication? Yes Does the patient have >5 day gap between last estimated fill dates? No  Star Rating Drugs:  Jardiance 25 mg last filled on 10/28/2202 for a 30-DS with [facility] Rosuvastatin 10 mg last filled on 09/17/2022 for a 90-DS with [facility]  Care Gaps: Annual wellness visit in last year? Yes Last eye exam / retinopathy screening: 01/31/2022 Last diabetic foot exam: 07/19/2022  I spoke with the patient and she reports that she is feeling better everyday. Per patient most of her symptoms have resolved  that she informed CPP about last month. Patient stated that she still has not been able to start the [facility] due to the shortage. Per patient she is still taking the Bydureon at this moment. Patient has no new concerns or issues at this time.  Patient has follow-up with Angelena Sole, CPP on 01/16/2023 @ 1400  Adelene Idler, CPA/CMA Programme researcher, broadcasting/film/video Phone: [phone]

## 2022-11-12 ENCOUNTER — Other Ambulatory Visit: Payer: Self-pay | Admitting: Family Medicine

## 2022-11-12 DIAGNOSIS — Z1231 Encounter for screening mammogram for malignant neoplasm of breast: Secondary | ICD-10-CM

## 2022-11-19 DIAGNOSIS — M9901 Segmental and somatic dysfunction of cervical region: Secondary | ICD-10-CM | POA: Diagnosis not present

## 2022-11-19 DIAGNOSIS — M5033 Other cervical disc degeneration, cervicothoracic region: Secondary | ICD-10-CM | POA: Diagnosis not present

## 2022-11-19 DIAGNOSIS — M9903 Segmental and somatic dysfunction of lumbar region: Secondary | ICD-10-CM | POA: Diagnosis not present

## 2022-11-19 DIAGNOSIS — M5416 Radiculopathy, lumbar region: Secondary | ICD-10-CM | POA: Diagnosis not present

## 2022-11-28 ENCOUNTER — Other Ambulatory Visit: Payer: Self-pay | Admitting: Cardiovascular Disease

## 2022-11-29 ENCOUNTER — Telehealth: Payer: Self-pay | Admitting: Cardiovascular Disease

## 2022-11-29 DIAGNOSIS — R197 Diarrhea, unspecified: Secondary | ICD-10-CM | POA: Diagnosis not present

## 2022-11-29 NOTE — Telephone Encounter (Signed)
   Pre-operative Risk Assessment    Patient Name: Bonnie Rowland  DOB: [birth year] MRN: [id number]      Request for Surgical Clearance    Procedure:  Colonoscopy  Date of Surgery:  Clearance 02/28/23                                 Surgeon:  Dr. Gareth Morgan Group or Practice Name:  [facility]  Phone number:  [phone]  Fax number:  [phone]   Type of Clearance Requested:   - Medical    Type of Anesthesia:  General    Additional requests/questions:    Signed, Narda Amber   11/29/2022, 3:18 PM

## 2022-12-02 NOTE — Telephone Encounter (Signed)
Patient with diagnosis of atrial fibrillation on Eliquis for anticoagulation.    Procedure: colonoscopy Date of procedure: 02/28/23   CHA2DS2-VASc Score = 7   This indicates a 11.2% annual risk of stroke. The patient's score is based upon: CHF History: 1 HTN History: 1 Diabetes History: 1 Stroke History: 0 Vascular Disease History: 1 Age Score: 2 Gender Score: 1   A Fib ablation 09/25/22  - procedure set for 5 months after  CrCl 58 Platelet count 165  Per office protocol, patient can hold Eliquis for 2 days prior to procedure.   Patient will not need bridging with Lovenox (enoxaparin) around procedure.  **This guidance is not considered finalized until pre-operative APP has relayed final recommendations.**

## 2022-12-03 DIAGNOSIS — M5416 Radiculopathy, lumbar region: Secondary | ICD-10-CM | POA: Diagnosis not present

## 2022-12-03 DIAGNOSIS — M9903 Segmental and somatic dysfunction of lumbar region: Secondary | ICD-10-CM | POA: Diagnosis not present

## 2022-12-03 DIAGNOSIS — M5033 Other cervical disc degeneration, cervicothoracic region: Secondary | ICD-10-CM | POA: Diagnosis not present

## 2022-12-03 DIAGNOSIS — M9901 Segmental and somatic dysfunction of cervical region: Secondary | ICD-10-CM | POA: Diagnosis not present

## 2022-12-03 NOTE — Telephone Encounter (Signed)
Patient has appointment to see Dr. Lalla Brothers on 12/26/22. Will verify if clearance can be addressed at visit

## 2022-12-03 NOTE — Telephone Encounter (Signed)
   Name: Bonnie Rowland  DOB: [birth year]  MRN: [id number]  Primary Cardiologist: Julien Nordmann, MD   Preoperative team, please contact this patient and set up a phone call appointment for further preoperative risk assessment. Please obtain consent and complete medication review. Thank you for your help.  I confirm that guidance regarding antiplatelet and oral anticoagulation therapy has been completed and, if necessary, noted below.  Patient with diagnosis of atrial fibrillation on Eliquis for anticoagulation.     Procedure: colonoscopy Date of procedure: 02/28/23     CHA2DS2-VASc Score = 7   This indicates a 11.2% annual risk of stroke. The patient's score is based upon: CHF History: 1 HTN History: 1 Diabetes History: 1 Stroke History: 0 Vascular Disease History: 1 Age Score: 2 Gender Score: 1   A Fib ablation 09/25/22  - procedure set for 5 months after   CrCl 58 Platelet count 165   Per office protocol, patient can hold Eliquis for 2 days prior to procedure.   Patient will not need bridging with Lovenox (enoxaparin) around procedure.     Carlos Levering, NP 12/03/2022, 12:19 PM [facility]

## 2022-12-05 ENCOUNTER — Ambulatory Visit
Admission: RE | Admit: 2022-12-05 | Discharge: 2022-12-05 | Disposition: A | Discharge status: Home / Self Care | Payer: Medicare Other | Source: Ambulatory Visit | Attending: Family Medicine | Admitting: Family Medicine

## 2022-12-05 DIAGNOSIS — Z1231 Encounter for screening mammogram for malignant neoplasm of breast: Secondary | ICD-10-CM | POA: Insufficient documentation

## 2022-12-08 ENCOUNTER — Other Ambulatory Visit: Payer: Self-pay | Admitting: Student

## 2022-12-08 ENCOUNTER — Other Ambulatory Visit: Payer: Self-pay | Admitting: Family

## 2022-12-08 ENCOUNTER — Other Ambulatory Visit: Payer: Self-pay | Admitting: Family Medicine

## 2022-12-08 DIAGNOSIS — W57XXXD Bitten or stung by nonvenomous insect and other nonvenomous arthropods, subsequent encounter: Secondary | ICD-10-CM

## 2022-12-17 DIAGNOSIS — M9901 Segmental and somatic dysfunction of cervical region: Secondary | ICD-10-CM | POA: Diagnosis not present

## 2022-12-17 DIAGNOSIS — M5416 Radiculopathy, lumbar region: Secondary | ICD-10-CM | POA: Diagnosis not present

## 2022-12-17 DIAGNOSIS — M5033 Other cervical disc degeneration, cervicothoracic region: Secondary | ICD-10-CM | POA: Diagnosis not present

## 2022-12-17 DIAGNOSIS — M9903 Segmental and somatic dysfunction of lumbar region: Secondary | ICD-10-CM | POA: Diagnosis not present

## 2022-12-26 ENCOUNTER — Ambulatory Visit: Payer: Medicare Other | Admitting: Cardiology

## 2022-12-26 NOTE — Progress Notes (Unsigned)
  Electrophysiology Office Follow up Visit Note:    Date:  12/27/2022   ID:  Bonnie Rowland, DOB 18-Jan-1941, MRN [id number]  PCP:  Alba Cory, MD  [facility] Cardiologist:  Julien Nordmann, MD  [facility] Electrophysiologist:  Lanier Prude, MD    Interval History:    Bonnie Rowland is a 82 y.o. female who presents for a follow up visit.   She had an A-fib ablation September 25, 2022 during which the veins, posterior wall and CTI were ablated.  The patient was seen by [facility] in the A-fib clinic on October 23, 2022.  At that appointment she reported no recurrence of her arrhythmia.  She did have some diarrhea after the ablation while taking colchicine.  At the appointment with Clide Cliff she was still taking amiodarone but had a dose of 100 mg by mouth once daily.  She takes Eliquis for stroke prophylaxis.  Today she is doing well.  She has not had a sustained recurrence of her arrhythmia.  Continues to take Eliquis without bleeding issues.     Past medical, surgical, social and family history were reviewed.  ROS:   Please see the history of present illness.    All other systems reviewed and are negative.  EKGs/Labs/Other Studies Reviewed:    The following studies were reviewed today:  October 23, 2022 EKG shows sinus bradycardia  EKG Interpretation Date/Time:  Thursday December 27 2022 11:17:02 EDT Ventricular Rate:  51 PR Interval:  168 QRS Duration:  70 QT Interval:  472 QTC Calculation: 435 R Axis:   111  Text Interpretation: Sinus bradycardia with marked sinus arrhythmia Confirmed by Steffanie Dunn [phone]) on 12/27/2022 11:29:23 AM    Physical Exam:    VS:  BP (!) 140/60   Pulse (!) 51   Ht 5\' 3"  (1.6 m)   Wt 165 lb 12.8 oz (75.2 kg)   SpO2 99%   BMI 29.37 kg/m     Wt Readings from Last 3 Encounters:  12/27/22 165 lb 12.8 oz (75.2 kg)  10/23/22 163 lb (73.9 kg)  10/17/22 164 lb 12.8 oz (74.8 kg)     GEN:  Well nourished, well developed in no acute  distress CARDIAC: RRR, no murmurs, rubs, gallops RESPIRATORY:  Clear to auscultation without rales, wheezing or rhonchi       ASSESSMENT:    1. Persistent atrial fibrillation (HCC)   2. Atrial flutter, unspecified type (HCC)   3. Chronic combined systolic and diastolic CHF, NYHA class 2 (HCC)    PLAN:    In order of problems listed above:  #Persistent atrial fibrillation and flutter #High risk drug monitoring-amiodarone Doing well after her March 2024 catheter ablation.  On Eliquis for stroke prophylaxis. Stop amiodarone today.  #Chronic systolic and diastolic heart failure NYHA class II.  Warm dry on exam.  Rhythm control indicated as above.  Follow-up 6 months with EP APP.   Signed, Steffanie Dunn, MD, [facility], [facility] 12/27/2022 1:41 PM    Electrophysiology [facility]

## 2022-12-27 ENCOUNTER — Encounter: Payer: Self-pay | Admitting: Cardiology

## 2022-12-27 ENCOUNTER — Encounter: Payer: Self-pay | Admitting: Podiatry

## 2022-12-27 ENCOUNTER — Other Ambulatory Visit: Payer: Self-pay

## 2022-12-27 ENCOUNTER — Other Ambulatory Visit: Payer: Medicare Other

## 2022-12-27 ENCOUNTER — Ambulatory Visit (INDEPENDENT_AMBULATORY_CARE_PROVIDER_SITE_OTHER): Payer: Medicare Other | Admitting: Podiatry

## 2022-12-27 ENCOUNTER — Ambulatory Visit: Payer: Medicare Other | Attending: Cardiology | Admitting: Cardiology

## 2022-12-27 VITALS — BP 158/60 | HR 54

## 2022-12-27 VITALS — BP 140/60 | HR 51 | Ht 63.0 in | Wt 165.8 lb

## 2022-12-27 DIAGNOSIS — R300 Dysuria: Secondary | ICD-10-CM | POA: Diagnosis not present

## 2022-12-27 DIAGNOSIS — Q828 Other specified congenital malformations of skin: Secondary | ICD-10-CM | POA: Diagnosis not present

## 2022-12-27 DIAGNOSIS — I4819 Other persistent atrial fibrillation: Secondary | ICD-10-CM | POA: Diagnosis not present

## 2022-12-27 DIAGNOSIS — I4892 Unspecified atrial flutter: Secondary | ICD-10-CM | POA: Insufficient documentation

## 2022-12-27 DIAGNOSIS — I5042 Chronic combined systolic (congestive) and diastolic (congestive) heart failure: Secondary | ICD-10-CM | POA: Insufficient documentation

## 2022-12-27 DIAGNOSIS — E119 Type 2 diabetes mellitus without complications: Secondary | ICD-10-CM

## 2022-12-27 LAB — URINALYSIS, COMPLETE
Bilirubin, UA: NEGATIVE
Ketones, UA: NEGATIVE
Leukocytes,UA: NEGATIVE
Nitrite, UA: NEGATIVE
Protein,UA: NEGATIVE
RBC, UA: NEGATIVE
Specific Gravity, UA: 1.015 (ref 1.005–1.030)
Urobilinogen, Ur: 0.2 mg/dL (ref 0.2–1.0)
pH, UA: 7 (ref 5.0–7.5)

## 2022-12-27 LAB — MICROSCOPIC EXAMINATION

## 2022-12-27 NOTE — Progress Notes (Signed)
  Subjective:  Patient ID: Bonnie Rowland, female    DOB: 06/07/1941,  MRN: [id number]  Bonnie Rowland presents to clinic today for: painful porokeratotic lesions both feet. Pain prevent(s) comfortable ambulation. Aggravating factor is weightbearing with and without shoegear.  Chief Complaint  Patient presents with   Diabetes    "Check my calluses and my toes."  Dr. Verdis Frederickson @ [facility] - 10/18/2022, glucose 99 mg/dl in the am and 409 mg/dl     PCP is Alba Cory, MD.  Allergies  Allergen Reactions   Flexeril [Cyclobenzaprine] Hypertension   Cheese Other (See Comments)    bloating   Ciprofloxacin Hcl Other (See Comments)    Muscle pain   Coconut (Cocos Nucifera)     Upset stomach   Keflex [Cephalexin] Other (See Comments)    Patient prefers not to take this medication due to the side effects, caused tendon issues    Review of Systems: Negative except as noted in the HPI.  Objective: No changes noted in today's physical examination. Vitals:   12/27/22 1435  BP: (!) 158/60  Pulse: (!) 54    Bonnie Rowland is a pleasant 82 y.o. female in NAD. AAO x 3.  Vascular Examination: Capillary refill time <3 seconds b/l LE. Palpable pedal pulses b/l LE. Digital hair diminished b/l. No pedal edema b/l. Skin temperature gradient WNL b/l. No varicosities b/l. Marland Kitchen  Dermatological Examination: Pedal integument with normal turgor, texture and tone BLE. No open wounds b/l LE. No interdigital macerations noted b/l LE. Toenails 1-5 b/l well maintained with adequate length. No erythema, no edema, no drainage, no fluctuance. Porokeratotic lesion(s) submet head 1 left foot, submet head 3 right foot, and submet head 5 b/l. No erythema, no edema, no drainage, no fluctuance..  Neurological Examination: Protective sensation intact with 10 gram monofilament b/l LE. Vibratory sensation intact b/l LE.   Musculoskeletal Examination: Muscle strength 5/5 to all lower extremity muscle  groups bilaterally. Plantarflexed metatarsal(s) 5th metatarsal head b/l lower extremities.     Latest Ref Rng & Units 04/12/2022    1:01 PM  Hemoglobin A1C  Hemoglobin-A1c 4.8 - 5.6 % 6.4    Assessment/Plan: 1. Porokeratosis   2. Diabetes mellitus without complication (HCC)    -Patient was evaluated and treated. All patient's and/or POA's questions/concerns answered on today's visit. -Patient to continue soft, supportive shoe gear daily. -Porokeratotic lesion(s) submet head 1 left foot, submet head 3 right foot, and submet head 5 b/l pared and enucleated with sterile currette without incident. Total number of lesions debrided=4. -Patient/POA to call should there be question/concern in the interim.   Return in about 9 weeks (around 02/28/2023).  [name], DPM

## 2022-12-27 NOTE — Patient Instructions (Signed)
Medication Instructions:  Your physician has recommended you make the following change in your medication:  1) STOP taking amiodarone  *If you need a refill on your cardiac medications before your next appointment, please call your pharmacy*  Follow-Up: At [facility], you and your health needs are our priority.  As part of our continuing mission to provide you with exceptional heart care, we have created designated Provider Care Teams.  These Care Teams include your primary Cardiologist (physician) and Advanced Practice Providers (APPs -  Physician Assistants and Nurse Practitioners) who all work together to provide you with the care you need, when you need it.  Your next appointment:   6 month(s)  Provider:   Suzann Riddle, NP 

## 2022-12-31 ENCOUNTER — Encounter: Payer: Self-pay | Admitting: Podiatry

## 2023-01-01 DIAGNOSIS — E785 Hyperlipidemia, unspecified: Secondary | ICD-10-CM | POA: Diagnosis not present

## 2023-01-01 DIAGNOSIS — E1169 Type 2 diabetes mellitus with other specified complication: Secondary | ICD-10-CM | POA: Diagnosis not present

## 2023-01-01 DIAGNOSIS — M810 Age-related osteoporosis without current pathological fracture: Secondary | ICD-10-CM | POA: Diagnosis not present

## 2023-01-01 DIAGNOSIS — Z794 Long term (current) use of insulin: Secondary | ICD-10-CM | POA: Diagnosis not present

## 2023-01-01 DIAGNOSIS — E1165 Type 2 diabetes mellitus with hyperglycemia: Secondary | ICD-10-CM | POA: Diagnosis not present

## 2023-01-01 DIAGNOSIS — E1159 Type 2 diabetes mellitus with other circulatory complications: Secondary | ICD-10-CM | POA: Diagnosis not present

## 2023-01-01 DIAGNOSIS — I152 Hypertension secondary to endocrine disorders: Secondary | ICD-10-CM | POA: Diagnosis not present

## 2023-01-01 LAB — HEMOGLOBIN A1C: Hemoglobin A1C: 6.5

## 2023-01-01 LAB — CULTURE, URINE COMPREHENSIVE

## 2023-01-08 DIAGNOSIS — Z96651 Presence of right artificial knee joint: Secondary | ICD-10-CM | POA: Diagnosis not present

## 2023-01-08 DIAGNOSIS — M7050 Other bursitis of knee, unspecified knee: Secondary | ICD-10-CM | POA: Diagnosis not present

## 2023-01-08 DIAGNOSIS — M7051 Other bursitis of knee, right knee: Secondary | ICD-10-CM | POA: Diagnosis not present

## 2023-01-15 DIAGNOSIS — E113293 Type 2 diabetes mellitus with mild nonproliferative diabetic retinopathy without macular edema, bilateral: Secondary | ICD-10-CM | POA: Diagnosis not present

## 2023-01-15 DIAGNOSIS — H353132 Nonexudative age-related macular degeneration, bilateral, intermediate dry stage: Secondary | ICD-10-CM | POA: Diagnosis not present

## 2023-01-15 DIAGNOSIS — Z961 Presence of intraocular lens: Secondary | ICD-10-CM | POA: Diagnosis not present

## 2023-01-15 DIAGNOSIS — H3554 Dystrophies primarily involving the retinal pigment epithelium: Secondary | ICD-10-CM | POA: Diagnosis not present

## 2023-01-15 LAB — HM DIABETES EYE EXAM

## 2023-01-17 NOTE — Progress Notes (Signed)
01/18/2023 9:24 AM   Bonnie Rowland 25-Aug-1940 [id number]  Referring provider: Alba Cory, MD [address] [place],  [place] [address]  Urological history: 1. Urge incontinence -cysto (2019) - NED -contributing factors of age, obesity, caffeine, diabetes, vaginal atrophy (using vaginal estrogen cream), ACE inhibitors, alpha blockers, antiarrhythmics and diuretics -Managed with poise pads and vaginal estrogen cream   2. Renal stone -contrast CT 2021 - 3 mm left upper renal stone  3. Renal cyst -contrast CT 2021 - 12 mm left renal inferior pole cyst  HPI: Bonnie Rowland is a 82 y.o. female who presents today for follow up.   She contacted the office on December 27, 2022 stating that she had symptoms of UTI which she described as burning and lower abdominal pain.  Her UA that day was yellow clear, specific gravity 1.015, pH 7.0, 2+ glucose, 0-5 WBCs, 0-2 RBCs, 0-10 epithelial cells and a few bacteria.   Her urine culture grew out mixed urogenital flora.  She has been having chronic diarrhea as well.  She is scheduled for a colonoscopy in August.    Patient denies any modifying or aggravating factors.  Patient denies any recent UTI's, gross hematuria, dysuria or suprapubic/flank pain.  Patient denies any fevers, chills, nausea or vomiting.   She is applying vaginal estrogen cream three nights weekly.     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; left shoulder blade   Cancer (HCC) 05/2014   basal cell removed left  temple   Cataract 2003   Cellulitis and abscess of leg, except foot    Conjunctivitis unspecified    Dermatophytosis of nail    Diabetes mellitus    type II   Elevated troponin    Esophageal reflux    Hyperlipidemia    Hypertension    Osteoporosis 2016   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)    Renal artery stenosis (HCC)    Sprain of lumbar region    Thoracic or lumbosacral neuritis or radiculitis, unspecified    Urinary tract infection, site not specified     Surgical History: Past Surgical History:  Procedure Laterality Date   APPENDECTOMY     ATRIAL FIBRILLATION ABLATION N/A 09/25/2022   Procedure: ATRIAL FIBRILLATION ABLATION;  Surgeon: Lanier Prude, MD;  Location: [facility];  Service: Cardiovascular;  Laterality: N/A;   CARDIAC CATHETERIZATION     CARDIOVERSION N/A 11/30/2020   Procedure: CARDIOVERSION;  Surgeon: Antonieta Iba, MD;  Location: [facility] ORS;  Service: Cardiovascular;  Laterality: N/A;   CARDIOVERSION N/A 01/20/2021   Procedure: CARDIOVERSION;  Surgeon: Antonieta Iba, MD;  Location: [facility] ORS;  Service: Cardiovascular;  Laterality: N/A;   CARDIOVERSION N/A 01/30/2021   Procedure: CARDIOVERSION;  Surgeon: Iran Ouch, MD;  Location: [facility] ORS;  Service: Cardiovascular;  Laterality: N/A;   cataract surgery     COLONOSCOPY     COLONOSCOPY WITH PROPOFOL N/A 05/09/2016   Procedure: COLONOSCOPY WITH PROPOFOL;  Surgeon: Earline Mayotte, MD;  Location[facility]  ENDOSCOPY;  Service: Endoscopy;  Laterality: N/A;   DIAGNOSTIC MAMMOGRAM     EYE SURGERY Bilateral    Cataract Extraction   JOINT REPLACEMENT  2016   KNEE ARTHROPLASTY Right 03/30/2015   Procedure: COMPUTER ASSISTED TOTAL KNEE ARTHROPLASTY;  Surgeon: Donato Heinz, MD;  Location: [facility] ORS;  Service: Orthopedics;  Laterality: Right;   KNEE ARTHROSCOPY Right    KNEE CLOSED REDUCTION Right 05/23/2015   Procedure: CLOSED MANIPULATION KNEE;  Surgeon: Donato Heinz, MD;  Location: [facility] ORS;  Service:  Orthopedics;  Laterality: Right;   RIGHT/LEFT HEART CATH AND CORONARY ANGIOGRAPHY N/A 01/25/2021   Procedure: RIGHT/LEFT HEART CATH AND CORONARY ANGIOGRAPHY;  Surgeon: Yvonne Kendall, MD;  Location: [facility] INVASIVE CV LAB;  Service: Cardiovascular;  Laterality: N/A;   TONSILLECTOMY      Home Medications:  Allergies as of 01/18/2023       Reactions   Flexeril [cyclobenzaprine] Hypertension   Cheese Other (See Comments)   bloating   Ciprofloxacin Hcl Other (See Comments)   Muscle pain   Coconut (cocos Nucifera)    Upset stomach   Keflex [cephalexin] Other (See Comments)   Patient prefers not to take this medication due to the side effects, caused tendon issues        Medication List        Accurate as of January 18, 2023  9:24 AM. If you have any questions, ask your nurse or doctor.          acetaminophen 650 MG CR tablet Commonly known as: TYLENOL Take 1,300 mg by mouth at bedtime.   ALFALFA PO Take 1 tablet by mouth daily.   amoxicillin 500 MG tablet Commonly known as: AMOXIL Take 2,000 mg by mouth See admin instructions. Takes before dental procedures   ascorbic Acid 500 MG Cpcr Commonly known as: VITAMIN C Take 500 mg by mouth daily.   B-complex with vitamin C tablet Take 1 tablet by mouth daily.   BD Pen Needle Nano U/F 32G X 4 MM Misc Generic drug: Insulin Pen Needle USE 4 TIMES DAILY (HUMALOG 3 TIMES DAILY AND LANTUS ONCE DAILY) OFFICE NOTIFIED 08/06/17   BLACK ELDERBERRY(BERRY-FLOWER) PO Take 15 mLs by mouth daily. Liquid   CALCIUM 600 PO Take 600 mg by mouth daily.   carvedilol 6.25 MG tablet Commonly known as: COREG TAKE 1 TABLET BY MOUTH 2 TIMES DAILY WITH A MEAL.   cholecalciferol 1000 units tablet Commonly known as: VITAMIN D Take 1,000 Units by mouth daily. Shaklee   CoQ10 200 MG Caps Take 200 mg by mouth daily.   Eliquis 5 MG Tabs tablet Generic drug: apixaban TAKE 1 TABLET BY MOUTH TWICE A DAY   empagliflozin 25 MG Tabs  tablet Commonly known as: JARDIANCE Take 25 mg by mouth daily.   Entresto 49-51 MG Generic drug: sacubitril-valsartan TAKE 1 TABLET BY MOUTH TWICE A DAY   estradiol 0.1 MG/GM vaginal cream Commonly known as: ESTRACE APPLY 1/2 GM ONCE WEEKLY USING APPLICATOR, APPLY BLUEBERRY SIZED AMOUNT OF CREAM USING TIP OF FINGER TO URETHRA TWICE WEEKLY What changed: additional instructions   FreeStyle Libre Sensor System Misc by Does not apply route.   furosemide 40 MG tablet Commonly known as: LASIX TAKE 1 TABLET BY MOUTH TWICE A DAY   HumaLOG KwikPen 100 UNIT/ML KwikPen Generic drug: insulin lispro Inject 2-6 Units into the skin 3 (three) times daily as needed (High blood sugar).   insulin glargine 100 unit/mL Sopn Commonly known as: LANTUS Inject 12 Units into  the skin at bedtime.   Krill Oil 1000 MG Caps Take 1,000 mg by mouth daily.   Medical Compression Stockings Misc 1 each by Does not apply route daily.   Mounjaro 10 MG/0.5ML Pen Generic drug: tirzepatide Inject 10 mg into the skin once a week.   NETI POT SINUS WASH NA Place 1 Dose into the nose at bedtime.   OVER THE COUNTER MEDICATION Place 1 application  into both eyes See admin instructions. Blephadex eyelid foam, apply to eyelids once daily   OVER THE COUNTER MEDICATION Take 1 tablet by mouth daily. Vita-Lea Gold otc supplement With out vitamin K (Shaklee products)   OVER THE COUNTER MEDICATION Take 1 tablet by mouth daily. Nutriferon otc supplement   pantoprazole 40 MG tablet Commonly known as: Protonix Take 1 tablet (40 mg total) by mouth daily.   POTASSIUM & MAGNESIUM ASPARTAT PO Take 1 tablet by mouth daily.   PreserVision AREDS 2 Chew Chew 1 each by mouth 2 (two) times daily.   PROBIOTIC PO Take 1 capsule by mouth at bedtime.   Prolia 60 MG/ML Sosy injection Generic drug: denosumab Inject 60 mg into the skin every 6 (six) months.   rosuvastatin 10 MG tablet Commonly known as: CRESTOR TAKE 1  TABLET BY MOUTH EVERY DAY   SYSTANE BALANCE OP Place 1 drop into both eyes daily as needed (dry eyes).   tiZANidine 2 MG tablet Commonly known as: ZANAFLEX TAKE 1-2 TABLETS (2-4 MG TOTAL) BY MOUTH AT BEDTIME.   Vitamin D (Ergocalciferol) 1.25 MG (50000 UNIT) Caps capsule Commonly known as: DRISDOL Take 50,000 Units by mouth every Saturday.        Allergies:  Allergies  Allergen Reactions   Flexeril [Cyclobenzaprine] Hypertension   Cheese Other (See Comments)    bloating   Ciprofloxacin Hcl Other (See Comments)    Muscle pain   Coconut (Cocos Nucifera)     Upset stomach   Keflex [Cephalexin] Other (See Comments)    Patient prefers not to take this medication due to the side effects, caused tendon issues    Family History: Family History  Problem Relation Age of Onset   Diabetes Mother    Hypertension Mother    Cancer Mother    Kidney disease Mother    Hypertension Father    Cancer Father        Prostate   Breast cancer Maternal Aunt 61   Colon cancer Son    Kidney cancer Neg Hx    Bladder Cancer Neg Hx     Social History:  reports that she has never smoked. She has never used smokeless tobacco. She reports that she does not currently use alcohol after a past usage of about 1.0 standard drink of alcohol per week. She reports that she does not use drugs.  ROS: For pertinent review of systems please refer to history of present illness  Physical Exam: BP 134/81   Pulse 80   Ht 5\' 3"  (1.6 m)   Wt 161 lb (73 kg)   BMI 28.52 kg/m   Constitutional:  Well nourished. Alert and oriented, No acute distress. HEENT: [place] AT, moist mucus membranes.  Trachea midline Cardiovascular: No clubbing, cyanosis, or edema. Respiratory: Normal respiratory effort, no increased work of breathing. Neurologic: Grossly intact, no focal deficits, moving all 4 extremities. Psychiatric: Normal mood and affect.    Laboratory Data: Hemoglobin A1C Order: [id number] Component Ref Range &  Units 2 wk ago  Hemoglobin A1C 4.2 - 5.6 % 6.5  High   Average Blood Glucose (Calc) mg/dL 474  Resulting Agency [facility] - LAB  Narrative Performed by [facility] - LAB Normal Range:    4.2 - 5.6% Increased Risk:  5.7 - 6.4% Diabetes:        >= 6.5% Glycemic Control for adults with diabetes:  <7%    Specimen Collected: 01/01/23 15:01   Performed by: [facility] Last Resulted: 01/01/23 15:14  Received From: [facility]  Result Received: 01/15/23 13:28  I have reviewed the labs.   Pertinent Imaging N/A  Assessment and plan:  1.  Urge incontinence -at baseline -not bothersome -continue conservative management  2. Vaginal Atrophy -continue to apply the vaginal estrogen cream 3 nights weekly  3. Suprapubic pain/dysuria -we will follow up in September to reassess symptoms after her colonoscopy -if nothing is found on colonoscopy to explain her symptoms, we may need to consider cystoscopy    Return for September OAB, PVR .  These notes generated with voice recognition software. I apologize for typographical errors.  Cloretta Ned [facility] [address] [place], [place] [address] [phone]

## 2023-01-18 ENCOUNTER — Encounter: Payer: Self-pay | Admitting: Urology

## 2023-01-18 ENCOUNTER — Ambulatory Visit (INDEPENDENT_AMBULATORY_CARE_PROVIDER_SITE_OTHER): Payer: Medicare Other | Admitting: Urology

## 2023-01-18 VITALS — BP 134/81 | HR 80 | Ht 63.0 in | Wt 161.0 lb

## 2023-01-18 DIAGNOSIS — N952 Postmenopausal atrophic vaginitis: Secondary | ICD-10-CM | POA: Diagnosis not present

## 2023-01-18 DIAGNOSIS — N3941 Urge incontinence: Secondary | ICD-10-CM

## 2023-01-18 DIAGNOSIS — R300 Dysuria: Secondary | ICD-10-CM | POA: Diagnosis not present

## 2023-01-18 MED ORDER — ESTRADIOL 0.1 MG/GM VA CREA: TOPICAL_CREAM | VAGINAL | 3 refills | Status: DC

## 2023-01-28 DIAGNOSIS — M9903 Segmental and somatic dysfunction of lumbar region: Secondary | ICD-10-CM | POA: Diagnosis not present

## 2023-01-28 DIAGNOSIS — M5416 Radiculopathy, lumbar region: Secondary | ICD-10-CM | POA: Diagnosis not present

## 2023-01-28 DIAGNOSIS — M9901 Segmental and somatic dysfunction of cervical region: Secondary | ICD-10-CM | POA: Diagnosis not present

## 2023-01-28 DIAGNOSIS — M5033 Other cervical disc degeneration, cervicothoracic region: Secondary | ICD-10-CM | POA: Diagnosis not present

## 2023-02-01 NOTE — Telephone Encounter (Signed)
Duplicate fax sent please update status

## 2023-02-01 NOTE — Telephone Encounter (Signed)
Dr. Lalla Brothers,  You saw this patient on 12/27/2022 post ablation. She was doing well at that time. Will you please comment on medical clearance for colonoscopy?  Please route your response to P CV DIV Preop. I will communicate with requesting office once you have given recommendations.   Thank you!  Carlos Levering, NP

## 2023-02-03 NOTE — Telephone Encounter (Signed)
   Name: Bonnie Rowland  DOB: January 25, 1941  MRN: [id number]  Primary Cardiologist: Julien Nordmann, MD  Chart reviewed as part of pre-operative protocol coverage. Because of [name]'s past medical history and time since last visit, she will require a follow-up in-office visit in order to better assess preoperative cardiovascular risk.  Patient is scheduled with [facility], heart failure clinic, on 02/18/2023. Appointment notes have been updated to reflect need for pre-op evaluation.   Pre-op covering staff:  - Please contact requesting surgeon's office via preferred method (i.e, phone, fax) to inform them of need for appointment prior to surgery.  This message was also routed to pharmacy pool and recommendations have been made.   Carlos Levering, NP  02/03/2023, 7:38 PM

## 2023-02-08 ENCOUNTER — Other Ambulatory Visit: Payer: Self-pay | Admitting: Family Medicine

## 2023-02-08 DIAGNOSIS — M62830 Muscle spasm of back: Secondary | ICD-10-CM

## 2023-02-13 NOTE — Progress Notes (Unsigned)
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 Gershon Crane.   DM: seeing Endocrinologist - Dr. Turner Daniels  up to date with eye exam. She had A1C done recently and it was at goal .   She is on Bydureon ( waiting to switch to [facility] ) , Jardiance, pre-meal insulin and basal insulin.  Denies polyphagia, polydipsia or polyuria Urine micro is up to date . Eye exam is up to date . We will check urine micro    HTN: BP is at goal , no chest pain, compliant with medications  Intertrigo: she takes prn  discussed risk of QT prolongation with amiodarone but she states CHF clinic told her it was okay . She also takes prn diflucan when she takes antibiotics also . Unchanged    Atherosclerosis of aorta/renal artery atherosclerosis  on statin therapy Last LDL was 50,  continue Crestor   Senile purpura: both arms and hands. She takes Eliquis, reassurance given    Obesity . She has DM, HTN, dyslipidemia, atherosclerosis of aorta. She continues to be active at [facility], taking Tai Chi classes, she also walks on a regular basis . 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 Mariah Milling, she had an ablation done March 26 th 2024 ,  and she is feeling well, Amiodarone was stopped in June 2024  . She states she has mild lower extremity edema that is stable, she usually wears compression stocking hoses.  She denies palpitations. She is still taking Entresto, Eliquis, Carvedilol , Jardiance , Rosuvastatin also furosemide daily   Back pain: she has been taking Tizanidine every night 2-4 mg prn at night for spasms, pain is described as intermittent, aching  like and states exercising daily to keep back pain under control . Stable   Change in bowel movements: she saw GI and is going to have a colonoscopy for evaluation of diarrhea   Patient Active Problem List   Diagnosis Date Noted   Hypercoagulable state due to persistent atrial fibrillation (HCC) 10/23/2022   Rapid atrial fibrillation (HCC) 04/12/2022   Obesity (BMI 30-39.9)    Contusion of foot 03/08/2022   Pain due to onychomycosis of toenails of both feet 09/07/2021   Chronic diastolic CHF (congestive heart failure) (HCC)    AF (paroxysmal atrial fibrillation) (HCC) 01/23/2021   Controlled type 2 diabetes mellitus without complication, without long-term current use of insulin (HCC) 01/23/2021   Plantar fasciitis, bilateral 05/21/2019   Age-related osteoporosis without current pathological fracture 11/05/2017   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(HCC) 07/09/2017   Atherosclerosis of abdominal aorta (HCC) 07/09/2017   Bilateral carotid bruits 08/24/2015   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   Chronic pain 01/10/2015   History of melanoma excision 01/10/2015   Spinal stenosis, lumbar region, 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   DDD (degenerative disc disease), lumbar 11/14/2014   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   Hyperlipemia 11/07/2009   Hypertension, benign 11/07/2009   Atherosclerosis of renal artery (HCC) 11/07/2009   Perennial allergic rhinitis 11/27/2007   Lumbosacral neuritis 01/17/2007    Past Surgical History:  Procedure Laterality Date  APPENDECTOMY     ATRIAL FIBRILLATION ABLATION N/A 09/25/2022   Procedure: ATRIAL FIBRILLATION ABLATION;  Surgeon: Lanier Prude, MD;  Location: [facility];  Service: Cardiovascular;  Laterality: N/A;   CARDIAC CATHETERIZATION     CARDIOVERSION N/A 11/30/2020   Procedure: CARDIOVERSION;  Surgeon: Antonieta Iba, MD;  Location: [facility] OR[facility];  Service: Cardiovascular;  Laterality: N/A;   CARDIOVERSION N/A 01/20/2021   Procedure:  CARDIOVERSION;  Surgeon: Antonieta Iba, MD;  Location: [facility];  Service: Cardiovascular;  Laterality: N/A;   CARDIOVERSION N/A 01/30/2021   Procedure: CARDIOVERSION;  Surgeon: Iran Ouch, MD;  Location: [facility];  Service: Cardiovascular;  Laterality: N/A;   cataract surgery     COLONOSCOPY     COLONOSCOPY WITH PROPOFOL N/A 05/09/2016   Procedure: COLONOSCOPY WITH PROPOFOL;  Surgeon: Earline Mayotte, MD;  Location: [facility];  Service: Endoscopy;  Laterality: N/A;   DIAGNOSTIC MAMMOGRAM     EYE SURGERY Bilateral    Cataract Extraction   JOINT REPLACEMENT  2016   KNEE ARTHROPLASTY Right 03/30/2015   Procedure: COMPUTER ASSISTED TOTAL KNEE ARTHROPLASTY;  Surgeon: Donato Heinz, MD;  Location: [facility];  Service: Orthopedics;  Laterality: Right;   KNEE ARTHROSCOPY Right    KNEE CLOSED REDUCTION Right 05/23/2015   Procedure: CLOSED MANIPULATION KNEE;  Surgeon: Donato Heinz, MD;  Location: [facility];  Service: Orthopedics;  Laterality: Right;   RIGHT/LEFT HEART CATH AND CORONARY ANGIOGRAPHY N/A 01/25/2021   Procedure: RIGHT/LEFT HEART CATH AND CORONARY ANGIOGRAPHY;  Surgeon: Yvonne Kendall, MD;  Location: [facility];  Service: Cardiovascular;  Laterality: N/A;   TONSILLECTOMY      Family History  Problem Relation Age of Onset   Diabetes Mother    Hypertension Mother    Cancer Mother    Kidney disease Mother    Hypertension Father    Cancer Father        Prostate   Breast cancer Maternal Aunt 69   Colon cancer Son    Kidney cancer Neg Hx    Bladder Cancer Neg Hx     Social History   Tobacco Use   Smoking status: Never   Smokeless tobacco: Never   Tobacco comments:    Never smoke 10/23/22  Substance Use Topics   Alcohol use: Not Currently    Alcohol/week: 1.0 standard drink of alcohol    Types: 1 Glasses of wine per week    Comment: RARELY     Current Outpatient Medications:    acetaminophen (TYLENOL) 650 MG CR tablet, Take 1,300 mg by mouth  at bedtime., Disp: , Rfl:    ALFALFA PO, Take 1 tablet by mouth daily., Disp: , Rfl:    amoxicillin (AMOXIL) 500 MG tablet, Take 2,000 mg by mouth See admin instructions. Takes before dental procedures, Disp: , Rfl:    ascorbic Acid (VITAMIN C) 500 MG CPCR, Take 500 mg by mouth daily., Disp: , Rfl:    B Complex-C (B-COMPLEX WITH VITAMIN C) tablet, Take 1 tablet by mouth daily., Disp: , Rfl:    BD PEN NEEDLE NANO U/F 32G X 4 MM MISC, USE 4 TIMES DAILY (HUMALOG 3 TIMES DAILY AND LANTUS ONCE DAILY) OFFICE NOTIFIED 08/06/17, Disp: 90 each, Rfl: 1   BLACK ELDERBERRY,BERRY-FLOWER, PO, Take 15 mLs by mouth daily. Liquid, Disp: , Rfl:    Calcium Carbonate (CALCIUM 600 PO), Take 600 mg by mouth daily., Disp: , Rfl:    carvedilol (COREG) 6.25 MG tablet, TAKE 1  TABLET BY MOUTH 2 TIMES DAILY WITH A MEAL., Disp: 180 tablet, Rfl: 0   cholecalciferol (VITAMIN D) 1000 UNITS tablet, Take 1,000 Units by mouth daily. Shaklee, Disp: , Rfl:    Coenzyme Q10 (COQ10) 200 MG CAPS, Take 200 mg by mouth daily., Disp: , Rfl:    Continuous Blood Gluc Sensor (FREESTYLE LIBRE SENSOR SYSTEM) MISC, by Does not apply route., Disp: , Rfl:    Elastic Bandages & Supports (MEDICAL COMPRESSION STOCKINGS) MISC, 1 each by Does not apply route daily., Disp: 2 each, Rfl: 5   ELIQUIS 5 MG TABS tablet, TAKE 1 TABLET BY MOUTH TWICE A DAY, Disp: 180 tablet, Rfl: 1   empagliflozin (JARDIANCE) 25 MG TABS tablet, Take 25 mg by mouth daily., Disp: , Rfl:    ENTRESTO 49-51 MG, TAKE 1 TABLET BY MOUTH TWICE A DAY, Disp: 180 tablet, Rfl: 3   estradiol (ESTRACE) 0.1 MG/GM vaginal cream, APPLY 1/2 GM ONCE WEEKLY USING APPLICATOR, APPLY BLUEBERRY SIZED AMOUNT OF CREAM USING TIP OF FINGER TO URETHRA TWICE WEEKLY, Disp: 42.5 g, Rfl: 3   furosemide (LASIX) 40 MG tablet, TAKE 1 TABLET BY MOUTH TWICE A DAY, Disp: 180 tablet, Rfl: 3   HUMALOG KWIKPEN 100 UNIT/ML KiwkPen, Inject 2-6 Units into the skin 3 (three) times daily as needed (High blood sugar)., Disp: ,  Rfl: 3   insulin glargine (LANTUS) 100 unit/mL SOPN, Inject 12 Units into the skin at bedtime., Disp: , Rfl:    Krill Oil 1000 MG CAPS, Take 1,000 mg by mouth daily., Disp: , Rfl:    Mag Aspart-Potassium Aspart (POTASSIUM & MAGNESIUM ASPARTAT PO), Take 1 tablet by mouth daily., Disp: , Rfl:    Multiple Vitamins-Minerals (PRESERVISION AREDS 2) CHEW, Chew 1 each by mouth 2 (two) times daily., Disp: , Rfl:    OVER THE COUNTER MEDICATION, Place 1 application  into both eyes See admin instructions. Blephadex eyelid foam, apply to eyelids once daily, Disp: , Rfl:    OVER THE COUNTER MEDICATION, Take 1 tablet by mouth daily. Vita-Lea Gold otc supplement With out vitamin K (Shaklee products), Disp: , Rfl:    OVER THE COUNTER MEDICATION, Take 1 tablet by mouth daily. Nutriferon otc supplement, Disp: , Rfl:    Probiotic Product (PROBIOTIC PO), Take 1 capsule by mouth at bedtime., Disp: , Rfl:    PROLIA 60 MG/ML SOSY injection, Inject 60 mg into the skin every 6 (six) months., Disp: , Rfl:    Propylene Glycol (SYSTANE BALANCE OP), Place 1 drop into both eyes daily as needed (dry eyes)., Disp: , Rfl:    Sodium Chloride-Sodium Bicarb (NETI POT SINUS WASH NA), Place 1 Dose into the nose at bedtime., Disp: , Rfl:    tirzepatide (MOUNJARO) 10 MG/0.5ML Pen, Inject 10 mg into the skin once a week., Disp: , Rfl:    Vitamin D, Ergocalciferol, (DRISDOL) 1.25 MG (50000 UT) CAPS capsule, Take 50,000 Units by mouth every Saturday., Disp: , Rfl:    rosuvastatin (CRESTOR) 10 MG tablet, Take 1 tablet (10 mg total) by mouth daily., Disp: 90 tablet, Rfl: 1   tiZANidine (ZANAFLEX) 2 MG tablet, Take 1-2 tablets (2-4 mg total) by mouth at bedtime., Disp: 180 tablet, Rfl: 1  Allergies  Allergen Reactions   Flexeril [Cyclobenzaprine] Hypertension   Cheese Other (See Comments)    bloating   Ciprofloxacin Hcl Other (See Comments)    Muscle pain   Coconut (Cocos Nucifera)     Upset stomach   Keflex [Cephalexin] Other (See  Comments)    Patient prefers not to take this medication due to the side effects, caused tendon issues    I personally reviewed active problem list, medication list, allergies, family history, social history, health maintenance with the patient/caregiver today.   ROS  Constitutional: Negative for fever or weight change.  Respiratory: Negative for cough and shortness of breath.   Cardiovascular: Negative for chest pain or palpitations.  Gastrointestinal: Negative for abdominal pain, no bowel changes.  Musculoskeletal: positive  for gait problem or joint swelling.  Skin: Negative for rash.  Neurological: Negative for dizziness or headache.  No other specific complaints in a complete review of systems (except as listed in HPI above).   Objective  Vitals:   02/14/23 0903  BP: 132/74  Pulse: 72  Resp: 16  SpO2: 96%  Weight: 163 lb (73.9 kg)  Height: 5\' 3"  (1.6 m)    Body mass index is 28.87 kg/m.  Physical Exam  Constitutional: Patient appears well-developed and well-nourished. Obese  No distress.  HEENT: head atraumatic, normocephalic, pupils equal and reactive to light, neck supple Cardiovascular: Normal rate, regular rhythm and normal heart sounds.  No murmur heard. No BLE edema. Pulmonary/Chest: Effort normal and breath sounds normal. No respiratory distress. Abdominal: Soft.  There is no tenderness. Psychiatric: Patient has a normal mood and affect. behavior is normal. Judgment and thought content normal.   Recent Results (from the past 2160 hour(s))  CULTURE, URINE COMPREHENSIVE     Status: None   Collection Time: 12/27/22 11:55 AM   Specimen: Urine   UR  Result Value Ref Range   Urine Culture, Comprehensive Final report    Organism ID, Bacteria Comment     Comment: Mixed urogenital flora 5,000  Colonies/mL   Urinalysis, Complete     Status: Abnormal   Collection Time: 12/27/22 11:55 AM  Result Value Ref Range   Specific Gravity, UA 1.015 1.005 - 1.030   pH,  UA 7.0 5.0 - 7.5   Color, UA Yellow Yellow   Appearance Ur Clear Clear   Leukocytes,UA Negative Negative   Protein,UA Negative Negative/Trace   Glucose, UA 2+ (A) Negative   Ketones, UA Negative Negative   RBC, UA Negative Negative   Bilirubin, UA Negative Negative   Urobilinogen, Ur 0.2 0.2 - 1.0 mg/dL   Nitrite, UA Negative Negative   Microscopic Examination See below:   Microscopic Examination     Status: None   Collection Time: 12/27/22 11:55 AM   Urine  Result Value Ref Range   WBC, UA 0-5 0 - 5 /hpf   RBC, Urine 0-2 0 - 2 /hpf   Epithelial Cells (non renal) 0-10 0 - 10 /hpf   Bacteria, UA Few None seen/Few  Hemoglobin A1c     Status: None   Collection Time: 01/01/23 12:00 AM  Result Value Ref Range   Hemoglobin A1C 6.5   HM DIABETES EYE EXAM     Status: Abnormal   Collection Time: 01/15/23 12:00 AM  Result Value Ref Range   HM Diabetic Eye Exam Retinopathy (A) No Retinopathy    Comment: AEC-no treatment reccomended at this time    PHQ2/9:    02/14/2023    9:03 AM 08/16/2022    8:33 AM 04/26/2022    1:59 PM 02/12/2022    9:11 AM 01/09/2022    8:02 AM  Depression screen PHQ 2/9  Decreased Interest 0 0 0 0 0  Down, Depressed, Hopeless 0 0 0 0 0  PHQ -  2 Score 0 0 0 0 0  Altered sleeping 0 0  0 0  Tired, decreased energy 0 0  0 0  Change in appetite 0 0  0 0  Feeling bad or failure about yourself  0 0  0 0  Trouble concentrating 0 0  0 0  Moving slowly or fidgety/restless 0 0  0 0  Suicidal thoughts 0 0  0 0  PHQ-9 Score 0 0  0 0    phq 9 is negative   Fall Risk:    02/14/2023    9:03 AM 08/16/2022    8:33 AM 04/26/2022    1:59 PM 02/12/2022    9:11 AM 01/09/2022    8:02 AM  Fall Risk   Falls in the past year? 0 0 0 0 0  Number falls in past yr: 0 0 0 0 0  Injury with Fall? 0 0 0 0 0  Risk for fall due to : No Fall Risks No Fall Risks No Fall Risks No Fall Risks No Fall Risks  Follow up Falls prevention discussed Falls prevention discussed Falls  evaluation completed Falls prevention discussed Falls prevention discussed      Functional Status Survey: Is the patient deaf or have difficulty hearing?: Yes Does the patient have difficulty seeing, even when wearing glasses/contacts?: Yes Does the patient have difficulty concentrating, remembering, or making decisions?: No Does the patient have difficulty walking or climbing stairs?: Yes Does the patient have difficulty dressing or bathing?: No Does the patient have difficulty doing errands alone such as visiting a doctor's office or shopping?: No    Assessment & Plan  1. Dyslipidemia associated with type 2 diabetes mellitus (HCC)  - Urine Microalbumin w/creat. ratio - rosuvastatin (CRESTOR) 10 MG tablet; Take 1 tablet (10 mg total) by mouth daily.  Dispense: 90 tablet; Refill: 1  2. Atherosclerosis of renal artery (HCC)  - rosuvastatin (CRESTOR) 10 MG tablet; Take 1 tablet (10 mg total) by mouth daily.  Dispense: 90 tablet; Refill: 1  3. Non-ischemic cardiomyopathy (HCC)  Stable , on medical management   4. AF (paroxysmal atrial fibrillation) (HCC)  S/p ablation, SR today   5. Chronic combined systolic and diastolic congestive heart failure (HCC)  Doing well, compliant with medication , she also goes to CHF clinic   6. 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(HCC)  Reassurance given   7. Atherosclerosis of abdominal aorta (HCC)  On statin therapy, LDL at goal   8. Muscle spasm of back  - tiZANidine (ZANAFLEX) 2 MG tablet; Take 1-2 tablets (2-4 mg total) by mouth at bedtime.  Dispense: 180 tablet; Refill: 1  9. Osteoporosis, post-menopausal

## 2023-02-14 ENCOUNTER — Ambulatory Visit (INDEPENDENT_AMBULATORY_CARE_PROVIDER_SITE_OTHER): Payer: Medicare Other | Admitting: Family Medicine

## 2023-02-14 ENCOUNTER — Encounter: Payer: Self-pay | Admitting: Family Medicine

## 2023-02-14 VITALS — BP 132/74 | HR 72 | Resp 16 | Ht 63.0 in | Wt 163.0 lb

## 2023-02-14 DIAGNOSIS — I5042 Chronic combined systolic (congestive) and diastolic (congestive) heart failure: Secondary | ICD-10-CM

## 2023-02-14 DIAGNOSIS — I480 Paroxysmal atrial fibrillation: Secondary | ICD-10-CM | POA: Diagnosis not present

## 2023-02-14 DIAGNOSIS — G8929 Other chronic pain: Secondary | ICD-10-CM

## 2023-02-14 DIAGNOSIS — I428 Other cardiomyopathies: Secondary | ICD-10-CM | POA: Diagnosis not present

## 2023-02-14 DIAGNOSIS — D692 Other nonthrombocytopenic purpura: Secondary | ICD-10-CM | POA: Diagnosis not present

## 2023-02-14 DIAGNOSIS — M810 Age-related osteoporosis without current pathological fracture: Secondary | ICD-10-CM

## 2023-02-14 DIAGNOSIS — I701 Atherosclerosis of renal artery: Secondary | ICD-10-CM

## 2023-02-14 DIAGNOSIS — I700 Atherosclerosis of aorta: Secondary | ICD-10-CM

## 2023-02-14 DIAGNOSIS — E785 Hyperlipidemia, unspecified: Secondary | ICD-10-CM | POA: Diagnosis not present

## 2023-02-14 DIAGNOSIS — M545 Low back pain, unspecified: Secondary | ICD-10-CM

## 2023-02-14 DIAGNOSIS — Z794 Long term (current) use of insulin: Secondary | ICD-10-CM

## 2023-02-14 DIAGNOSIS — E1169 Type 2 diabetes mellitus with other specified complication: Secondary | ICD-10-CM

## 2023-02-14 DIAGNOSIS — M62830 Muscle spasm of back: Secondary | ICD-10-CM | POA: Insufficient documentation

## 2023-02-14 MED ORDER — TIZANIDINE HCL 2 MG PO TABS: 2.0000 mg | ORAL_TABLET | Freq: Every day | ORAL | 1 refills | Status: DC

## 2023-02-14 MED ORDER — ROSUVASTATIN CALCIUM 10 MG PO TABS: 10.0000 mg | ORAL_TABLET | Freq: Every day | ORAL | 1 refills | Status: DC

## 2023-02-15 LAB — MICROALBUMIN / CREATININE URINE RATIO
Creatinine, Urine: 14 mg/dL — ABNORMAL LOW (ref 20–275)
Microalb, Ur: <0.2 mg/dL

## 2023-02-18 ENCOUNTER — Other Ambulatory Visit: Payer: Self-pay

## 2023-02-18 ENCOUNTER — Encounter: Payer: Self-pay | Admitting: Family

## 2023-02-18 ENCOUNTER — Ambulatory Visit: Payer: Medicare Other | Attending: Family | Admitting: Family

## 2023-02-18 VITALS — BP 122/46 | HR 65 | Ht 63.0 in | Wt 163.0 lb

## 2023-02-18 DIAGNOSIS — E1136 Type 2 diabetes mellitus with diabetic cataract: Secondary | ICD-10-CM | POA: Diagnosis not present

## 2023-02-18 DIAGNOSIS — I2510 Atherosclerotic heart disease of native coronary artery without angina pectoris: Secondary | ICD-10-CM | POA: Diagnosis not present

## 2023-02-18 DIAGNOSIS — I110 Hypertensive heart disease with heart failure: Secondary | ICD-10-CM | POA: Diagnosis not present

## 2023-02-18 DIAGNOSIS — H3554 Dystrophies primarily involving the retinal pigment epithelium: Secondary | ICD-10-CM | POA: Diagnosis not present

## 2023-02-18 DIAGNOSIS — E1169 Type 2 diabetes mellitus with other specified complication: Secondary | ICD-10-CM | POA: Diagnosis not present

## 2023-02-18 DIAGNOSIS — M9903 Segmental and somatic dysfunction of lumbar region: Secondary | ICD-10-CM | POA: Diagnosis not present

## 2023-02-18 DIAGNOSIS — E785 Hyperlipidemia, unspecified: Secondary | ICD-10-CM

## 2023-02-18 DIAGNOSIS — I5032 Chronic diastolic (congestive) heart failure: Secondary | ICD-10-CM

## 2023-02-18 DIAGNOSIS — I428 Other cardiomyopathies: Secondary | ICD-10-CM | POA: Insufficient documentation

## 2023-02-18 DIAGNOSIS — Z01810 Encounter for preprocedural cardiovascular examination: Secondary | ICD-10-CM | POA: Insufficient documentation

## 2023-02-18 DIAGNOSIS — Z961 Presence of intraocular lens: Secondary | ICD-10-CM | POA: Diagnosis not present

## 2023-02-18 DIAGNOSIS — Z01818 Encounter for other preprocedural examination: Secondary | ICD-10-CM | POA: Diagnosis present

## 2023-02-18 DIAGNOSIS — K219 Gastro-esophageal reflux disease without esophagitis: Secondary | ICD-10-CM | POA: Diagnosis not present

## 2023-02-18 DIAGNOSIS — I480 Paroxysmal atrial fibrillation: Secondary | ICD-10-CM | POA: Diagnosis not present

## 2023-02-18 DIAGNOSIS — M9901 Segmental and somatic dysfunction of cervical region: Secondary | ICD-10-CM | POA: Diagnosis not present

## 2023-02-18 DIAGNOSIS — Z7901 Long term (current) use of anticoagulants: Secondary | ICD-10-CM | POA: Diagnosis not present

## 2023-02-18 DIAGNOSIS — M5416 Radiculopathy, lumbar region: Secondary | ICD-10-CM | POA: Diagnosis not present

## 2023-02-18 DIAGNOSIS — I10 Essential (primary) hypertension: Secondary | ICD-10-CM | POA: Diagnosis not present

## 2023-02-18 DIAGNOSIS — H353133 Nonexudative age-related macular degeneration, bilateral, advanced atrophic without subfoveal involvement: Secondary | ICD-10-CM | POA: Diagnosis not present

## 2023-02-18 DIAGNOSIS — Z79899 Other long term (current) drug therapy: Secondary | ICD-10-CM | POA: Diagnosis not present

## 2023-02-18 DIAGNOSIS — M5033 Other cervical disc degeneration, cervicothoracic region: Secondary | ICD-10-CM | POA: Diagnosis not present

## 2023-02-18 LAB — BASIC METABOLIC PANEL
BUN/Creatinine Ratio: 16 (ref 12–28)
BUN: 15 mg/dL (ref 8–27)
CO2: 28 mmol/L (ref 20–29)
Calcium: 9.4 mg/dL (ref 8.7–10.3)
Chloride: 100 mmol/L (ref 96–106)
Creatinine, Ser: 0.96 mg/dL (ref 0.57–1.00)
Glucose: 137 mg/dL — ABNORMAL HIGH (ref 70–99)
Potassium: 4.3 mmol/L (ref 3.5–5.2)
Sodium: 142 mmol/L (ref 134–144)
eGFR: 60 mL/min/{1.73_m2} (ref >59)

## 2023-02-18 NOTE — Progress Notes (Signed)
PCP: Alba Cory, MD (last seen 08/24) Primary Cardiologist: Wallis Bamberg, NP (last seen 02/24) EP: Steffanie Dunn, MD (last seen 06/24)    HPI:  Bonnie Rowland is a 82 y/o female with a history of HTN, DM, hyperlipidemia, atrial fibrillation, GERD and chronic heart failure.   Has not been admitted or been in the ED in the last 6 months.   Echo 01/23/21: EF of 45-50% with mild LVH, severe hypokinesis of left ventricle and trivial MR.  Echo 03/31/21: EF of  60-65% without structural changes.   RHC/LHC done 01/25/21 and showed: Mild to moderate, non-obstructive coronary artery disease, including 30% mid LAD disease, 50-60% mid/distal LCx stenosis, and 60% proximal/mid RCA lesion. Mildly to moderately reduced left ventricular systolic function with mid anterior hypo/akinesis; query atypical Takotsubo variant.  LVEF ~45%. Mildly elevated left heart filling pressures. Moderately elevated right heart filling and pulmonary artery pressures with significant pulmonary vascular resistance. Mildly reduced Fick cardiac output/index.  Cardiac CT 09/18/22: Small foci of subsegmental atelectasis at the anterior lung bases and in LEFT lower lobe.  She presents today for a HF follow-up visit with a chief complaint of minimal fatigue with moderate exertion. Has associated frequent diarrhea and a little pedal edema along with this. Denies SOB, chest pain, palpitations, dizziness, difficulty sleeping or weight gain.   Is supposed to have a colonoscopy 02/28/23  Walking daily and doing tai chi for exercise. Fluctuating glucose levels. Using libre freestyle and was 161 this morning.   ROS: All systems negative except as listed in HPI, PMH and Problem List.  SH:  Social History   Socioeconomic History   Marital status: Married    Spouse name: Bonnie Rowland   Number of children: 1   Years of education: Not on file   Highest education level: Master's degree (e.g., MA, Bonnie, MEng, MEd, MSW, MBA)   Occupational History   Occupation: Retired  Tobacco Use   Smoking status: Never   Smokeless tobacco: Never   Tobacco comments:    Never smoke 10/23/22  Vaping Use   Vaping status: Never Used  Substance and Sexual Activity   Alcohol use: Not Currently    Alcohol/week: 1.0 standard drink of alcohol    Types: 1 Glasses of wine per week    Comment: RARELY   Drug use: No   Sexual activity: Yes    Partners: Male    Birth control/protection: Post-menopausal  Other Topics Concern   Not on file  Social History Narrative   She is married , only has one son and he was diagnosed with colon cancer at age 73.    Social Determinants of Health   Financial Resource Strain: Low Risk  (04/26/2022)   Overall Financial Resource Strain (CARDIA)    Difficulty of Paying Living Expenses: Not hard at all  Food Insecurity: No Food Insecurity (04/26/2022)   Hunger Vital Sign    Worried About Running Out of Food in the Last Year: Never true    Ran Out of Food in the Last Year: Never true  Transportation Needs: No Transportation Needs (04/26/2022)   PRAPARE - Administrator, Civil Service (Medical): No    Lack of Transportation (Non-Medical): No  Physical Activity: Sufficiently Active (04/26/2022)   Exercise Vital Sign    Days of Exercise per Week: 7 days    Minutes of Exercise per Session: 30 min  Stress: No Stress Concern Present (04/26/2022)   [employer] of Occupational Health - Occupational Stress Questionnaire  Feeling of Stress : Not at all  Social Connections: Socially Integrated (04/26/2022)   Social Connection and Isolation Panel [NHANES]    Frequency of Communication with Friends and Family: More than three times a week    Frequency of Social Gatherings with Friends and Family: More than three times a week    Attends Religious Services: More than 4 times per year    Active Member of [employer] or Organizations: Yes    Attends Engineer, structural: More than 4  times per year    Marital Status: Married  Catering manager Violence: Not At Risk (04/26/2022)   Humiliation, Afraid, Rape, and Kick questionnaire    Fear of Current or Ex-Partner: No    Emotionally Abused: No    Physically Abused: No    Sexually Abused: No    FH:  Family History  Problem Relation Age of Onset   Diabetes Mother    Hypertension Mother    Cancer Mother    Kidney disease Mother    Hypertension Father    Cancer Father        Prostate   Breast cancer Maternal Aunt 40   Colon cancer Son    Kidney cancer Neg Hx    Bladder Cancer Neg Hx     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; left shoulder blade   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     ALFALFA PO Take 1 tablet by mouth daily.     amoxicillin (AMOXIL) 500 MG tablet Take 2,000 mg by mouth See admin instructions. Takes before dental procedures     ascorbic  Acid (VITAMIN C) 500 MG CPCR Take 500 mg by mouth daily.     B Complex-C (B-COMPLEX WITH VITAMIN C) tablet Take 1 tablet by mouth daily.     BD PEN NEEDLE NANO U/F 32G X 4 MM MISC USE 4 TIMES DAILY (HUMALOG 3 TIMES DAILY AND LANTUS ONCE DAILY) OFFICE NOTIFIED 08/06/17 90 each 1   BLACK ELDERBERRY,BERRY-FLOWER, PO Take 15 mLs by mouth daily. Liquid     Calcium Carbonate (CALCIUM 600 PO) Take 600 mg by mouth daily.     carvedilol (COREG) 6.25 MG tablet TAKE 1 TABLET BY MOUTH 2 TIMES DAILY WITH A MEAL. 180 tablet 0  cholecalciferol (VITAMIN D) 1000 UNITS tablet Take 1,000 Units by mouth daily. Shaklee     Coenzyme Q10 (COQ10) 200 MG CAPS Take 200 mg by mouth daily.     Continuous Blood Gluc Sensor (FREESTYLE LIBRE SENSOR SYSTEM) MISC by Does not apply route.     Elastic Bandages & Supports (MEDICAL COMPRESSION STOCKINGS) MISC 1 each by Does not apply route daily. 2 each 5   ELIQUIS 5 MG TABS tablet TAKE 1 TABLET BY MOUTH TWICE A DAY 180 tablet 1   empagliflozin (JARDIANCE) 25 MG TABS tablet Take 25 mg by mouth daily.     ENTRESTO 49-51 MG TAKE 1 TABLET BY MOUTH TWICE A DAY 180 tablet 3   estradiol (ESTRACE) 0.1 MG/GM vaginal cream APPLY 1/2 GM ONCE WEEKLY USING APPLICATOR, APPLY BLUEBERRY SIZED AMOUNT OF CREAM USING TIP OF FINGER TO URETHRA TWICE WEEKLY 42.5 g 3   furosemide (LASIX) 40 MG tablet TAKE 1 TABLET BY MOUTH TWICE A DAY 180 tablet 3   HUMALOG KWIKPEN 100 UNIT/ML KiwkPen Inject 2-6 Units into the skin 3 (three) times daily as needed (High blood sugar).  3   insulin glargine (LANTUS) 100 unit/mL SOPN Inject 12 Units into the skin at bedtime.     Krill Oil 1000 MG CAPS Take 1,000 mg by mouth daily.     Mag Aspart-Potassium Aspart (POTASSIUM & MAGNESIUM ASPARTAT PO) Take 1 tablet by mouth daily.     Multiple Vitamins-Minerals (PRESERVISION AREDS 2) CHEW Chew 1 each by mouth 2 (two) times daily.     OVER THE COUNTER MEDICATION Place 1 application  into both eyes See admin instructions. Blephadex  eyelid foam, apply to eyelids once daily     OVER THE COUNTER MEDICATION Take 1 tablet by mouth daily. Vita-Lea Gold otc supplement With out vitamin K (Shaklee products)     OVER THE COUNTER MEDICATION Take 1 tablet by mouth daily. Nutriferon otc supplement     Probiotic Product (PROBIOTIC PO) Take 1 capsule by mouth at bedtime.     PROLIA 60 MG/ML SOSY injection Inject 60 mg into the skin every 6 (six) months.     Propylene Glycol (SYSTANE BALANCE OP) Place 1 drop into both eyes daily as needed (dry eyes).     rosuvastatin (CRESTOR) 10 MG tablet Take 1 tablet (10 mg total) by mouth daily. 90 tablet 1   Sodium Chloride-Sodium Bicarb (NETI POT SINUS WASH NA) Place 1 Dose into the nose at bedtime.     tirzepatide (MOUNJARO) 10 MG/0.5ML Pen Inject 10 mg into the skin once a week.     tiZANidine (ZANAFLEX) 2 MG tablet Take 1-2 tablets (2-4 mg total) by mouth at bedtime. 180 tablet 1   Vitamin D, Ergocalciferol, (DRISDOL) 1.25 MG (50000 UT) CAPS capsule Take 50,000 Units by mouth every Saturday.     No current facility-administered medications for this visit.   Vitals:   02/18/23 1104  BP: (!) 122/46  Pulse: 65  SpO2: 97%  Weight: 163 lb (73.9 kg)  Height: 5\' 3"  (1.6 m)   Wt Readings from Last 3 Encounters:  02/18/23 163 lb (73.9 kg)  02/14/23 163 lb (73.9 kg)  01/18/23 161 lb (73 kg)   Lab Results  Component Value Date   CREATININE 0.92 10/17/2022   CREATININE 0.92 09/17/2022   CREATININE 0.86 08/16/2022   PHYSICAL EXAM:  General:  Well appearing. No resp difficulty HEENT: normal Neck: supple. JVP flat. Carotids 2+ bilaterally; no bruits. No lymphadenopathy or thryomegaly appreciated. Cor:  PMI normal. Regular rate & rhythm. No rubs, gallops or murmurs. Lungs: clear Abdomen: soft, nontender, nondistended. No hepatosplenomegaly. No bruits or masses. Good bowel sounds. Extremities: no cyanosis, clubbing, rash, edema Neuro: alert & orientedx3, cranial nerves grossly intact. Moves  all 4 extremities w/o difficulty. Affect pleasant.   ECG: NSR with nonspecific changes, HR 74 (personally reviewed)   ASSESSMENT & PLAN:  1: NICM with preserved ejection fraction- - suspect due to atrial fibrillation - NYHA class II - euvolemic today - weighing daily; reminded to call for an overnight weight gain of > 2 pounds or a weekly weight gain of > 5 pounds.  - weight stable from her last visit here 4 months ago - Echo 01/23/21: EF of 45-50% with mild LVH, severe hypokinesis of left ventricle and trivial MR.  - Echo 03/31/21: EF of  60-65% without structural changes.  - have scheduled updated echo for 04/19/23 - RHC/LHC done 01/25/21; Mild to moderate, non-obstructive coronary artery disease, including 30% mid LAD disease, 50-60% mid/distal LCx stenosis, and 60% proximal/mid RCA lesion. Mildly to moderately reduced left ventricular systolic function with mid anterior hypo/akinesis; query atypical Takotsubo variant.  LVEF ~45%. Mildly elevated left heart filling pressures. Moderately elevated right heart filling and pulmonary artery pressures with significant pulmonary vascular resistance. Mildly reduced Fick cardiac output/index. - EKG is NSR - is trying to walk between 3000-5000 steps daily - not adding salt and has been reading food labels - saw cardiology Bonnie Rowland) 02/24 - continue entresto 49/51 BID - continue jardiance 25mg  daily (DM dose) - continue carvedilol 6.25mg  BID - continue furosemide 40mg  BID - consider spironolactone - wearing compression socks daily and removes HS - BNP 04/11/22 was 347.5  2: HTN- - BP 122/46 - saw PCP Bonnie Rowland) 08/24 - BMP 10/17/22 reviewed and showed sodium 141, potassium 3.6, creatinine 0.92  & GFR >60 - BMP today  3: DM- - A1c 01/01/23 was 6.5% - saw endocrinology Bonnie Rowland) 07/24  4: Paroxysmal atrial fibrillation- - unsuccessfully cardioverted twice 10/23 - saw EP Bonnie Rowland) 06/243 - continue eliquis 5mg  BID - no longer on  amiodarone - ablation done 03/24 - went to AF clinic 04/24  Per telephone note 11/29/22, will have patient hold eliquis 2 days prior to procedure (colonoscopy) & then resume after procedure. EKG normal today and BMP drawn. Cardiac clearance for her colonoscopy provided today.   Return the week after echo is done, sooner if needed.

## 2023-02-19 ENCOUNTER — Encounter: Payer: Self-pay | Admitting: Family

## 2023-02-21 ENCOUNTER — Encounter: Payer: Self-pay | Admitting: Gastroenterology

## 2023-02-21 ENCOUNTER — Other Ambulatory Visit: Payer: Self-pay | Admitting: Medical Genetics

## 2023-02-21 DIAGNOSIS — Z006 Encounter for examination for normal comparison and control in clinical research program: Secondary | ICD-10-CM

## 2023-02-25 DIAGNOSIS — M5416 Radiculopathy, lumbar region: Secondary | ICD-10-CM | POA: Diagnosis not present

## 2023-02-25 DIAGNOSIS — M5033 Other cervical disc degeneration, cervicothoracic region: Secondary | ICD-10-CM | POA: Diagnosis not present

## 2023-02-25 DIAGNOSIS — M9903 Segmental and somatic dysfunction of lumbar region: Secondary | ICD-10-CM | POA: Diagnosis not present

## 2023-02-25 DIAGNOSIS — M9901 Segmental and somatic dysfunction of cervical region: Secondary | ICD-10-CM | POA: Diagnosis not present

## 2023-02-27 ENCOUNTER — Encounter: Payer: Self-pay | Admitting: Gastroenterology

## 2023-02-28 ENCOUNTER — Ambulatory Visit: Payer: Medicare Other | Admitting: Registered Nurse

## 2023-02-28 ENCOUNTER — Encounter: Payer: Self-pay | Admitting: Gastroenterology

## 2023-02-28 ENCOUNTER — Ambulatory Visit
Admission: RE | Admit: 2023-02-28 | Discharge: 2023-02-28 | Disposition: A | Discharge status: Home / Self Care | Payer: Medicare Other | Attending: Gastroenterology | Admitting: Gastroenterology

## 2023-02-28 ENCOUNTER — Encounter
Admission: RE | Disposition: A | Discharge status: Home / Self Care | Payer: Self-pay | Source: Home / Self Care | Attending: Gastroenterology

## 2023-02-28 DIAGNOSIS — Z9049 Acquired absence of other specified parts of digestive tract: Secondary | ICD-10-CM | POA: Diagnosis not present

## 2023-02-28 DIAGNOSIS — I5042 Chronic combined systolic (congestive) and diastolic (congestive) heart failure: Secondary | ICD-10-CM | POA: Diagnosis not present

## 2023-02-28 DIAGNOSIS — D3A8 Other benign neuroendocrine tumors: Secondary | ICD-10-CM | POA: Diagnosis not present

## 2023-02-28 DIAGNOSIS — Z800 Family history of malignant neoplasm of digestive organs: Secondary | ICD-10-CM | POA: Diagnosis not present

## 2023-02-28 DIAGNOSIS — Z96651 Presence of right artificial knee joint: Secondary | ICD-10-CM | POA: Diagnosis not present

## 2023-02-28 DIAGNOSIS — E1151 Type 2 diabetes mellitus with diabetic peripheral angiopathy without gangrene: Secondary | ICD-10-CM | POA: Insufficient documentation

## 2023-02-28 DIAGNOSIS — M810 Age-related osteoporosis without current pathological fracture: Secondary | ICD-10-CM | POA: Diagnosis not present

## 2023-02-28 DIAGNOSIS — Z794 Long term (current) use of insulin: Secondary | ICD-10-CM | POA: Diagnosis not present

## 2023-02-28 DIAGNOSIS — K529 Noninfective gastroenteritis and colitis, unspecified: Secondary | ICD-10-CM | POA: Diagnosis not present

## 2023-02-28 DIAGNOSIS — I4891 Unspecified atrial fibrillation: Secondary | ICD-10-CM | POA: Diagnosis not present

## 2023-02-28 DIAGNOSIS — K649 Unspecified hemorrhoids: Secondary | ICD-10-CM | POA: Diagnosis not present

## 2023-02-28 DIAGNOSIS — K219 Gastro-esophageal reflux disease without esophagitis: Secondary | ICD-10-CM | POA: Diagnosis not present

## 2023-02-28 DIAGNOSIS — K5730 Diverticulosis of large intestine without perforation or abscess without bleeding: Secondary | ICD-10-CM | POA: Diagnosis not present

## 2023-02-28 DIAGNOSIS — K621 Rectal polyp: Secondary | ICD-10-CM | POA: Diagnosis not present

## 2023-02-28 DIAGNOSIS — Z79899 Other long term (current) drug therapy: Secondary | ICD-10-CM | POA: Diagnosis not present

## 2023-02-28 DIAGNOSIS — D3A026 Benign carcinoid tumor of the rectum: Secondary | ICD-10-CM | POA: Diagnosis not present

## 2023-02-28 DIAGNOSIS — Z7901 Long term (current) use of anticoagulants: Secondary | ICD-10-CM | POA: Diagnosis not present

## 2023-02-28 DIAGNOSIS — Z7984 Long term (current) use of oral hypoglycemic drugs: Secondary | ICD-10-CM | POA: Diagnosis not present

## 2023-02-28 DIAGNOSIS — I2510 Atherosclerotic heart disease of native coronary artery without angina pectoris: Secondary | ICD-10-CM | POA: Diagnosis not present

## 2023-02-28 DIAGNOSIS — I480 Paroxysmal atrial fibrillation: Secondary | ICD-10-CM | POA: Diagnosis not present

## 2023-02-28 DIAGNOSIS — Z85820 Personal history of malignant melanoma of skin: Secondary | ICD-10-CM | POA: Diagnosis not present

## 2023-02-28 DIAGNOSIS — I110 Hypertensive heart disease with heart failure: Secondary | ICD-10-CM | POA: Insufficient documentation

## 2023-02-28 DIAGNOSIS — R197 Diarrhea, unspecified: Secondary | ICD-10-CM | POA: Diagnosis not present

## 2023-02-28 HISTORY — PX: COLONOSCOPY: SHX5424

## 2023-02-28 HISTORY — PX: POLYPECTOMY: SHX5525

## 2023-02-28 HISTORY — PX: BIOPSY: SHX5522

## 2023-02-28 LAB — GLUCOSE, CAPILLARY: Glucose-Capillary: 148 mg/dL — ABNORMAL HIGH (ref 70–99)

## 2023-02-28 SURGERY — COLONOSCOPY
Anesthesia: General

## 2023-02-28 MED ORDER — LIDOCAINE HCL (PF) 2 % IJ SOLN
INTRAMUSCULAR | Status: AC
  Filled 2023-02-28: qty 5

## 2023-02-28 MED ORDER — PROPOFOL 10 MG/ML IV BOLUS
INTRAVENOUS | Status: DC | PRN
  Administered 2023-02-28: 50 mg via INTRAVENOUS

## 2023-02-28 MED ORDER — PROPOFOL 500 MG/50ML IV EMUL
INTRAVENOUS | Status: DC | PRN
  Administered 2023-02-28: 100 ug/kg/min via INTRAVENOUS

## 2023-02-28 MED ORDER — LIDOCAINE HCL (CARDIAC) PF 100 MG/5ML IV SOSY
PREFILLED_SYRINGE | INTRAVENOUS | Status: DC | PRN
  Administered 2023-02-28: 50 mg via INTRAVENOUS

## 2023-02-28 MED ORDER — PROPOFOL 10 MG/ML IV BOLUS
INTRAVENOUS | Status: AC
  Filled 2023-02-28: qty 20

## 2023-02-28 MED ORDER — SODIUM CHLORIDE 0.9 % IV SOLN: INTRAVENOUS | Status: DC

## 2023-02-28 NOTE — Interval H&P Note (Signed)
History and Physical Interval Note: Preprocedure H&P from 02/28/23  was reviewed and there was no interval change after seeing and examining the patient.  Written consent was obtained from the patient after discussion of risks, benefits, and alternatives. Patient has consented to proceed with Colonoscopy with possible intervention   02/28/2023 8:42 AM  [name]  has presented today for surgery, with the diagnosis of 787.91 (ICD-9-CM) - R19.7 (ICD-10-CM) - Diarrhea, unspecified type.  The various methods of treatment have been discussed with the patient and family. After consideration of risks, benefits and other options for treatment, the patient has consented to  Procedure(s): COLONOSCOPY (N/A) as a surgical intervention.  The patient's history has been reviewed, patient examined, no change in status, stable for surgery.  I have reviewed the patient's chart and labs.  Questions were answered to the patient's satisfaction.     Bonnie Rowland

## 2023-02-28 NOTE — Transfer of Care (Signed)
Immediate Anesthesia Transfer of Care Note  Patient: Bonnie Rowland  Procedure(s) Performed: COLONOSCOPY  Patient Location: Endoscopy Unit  Anesthesia Type:General  Level of Consciousness: drowsy and patient cooperative  Airway & Oxygen Therapy: Patient Spontanous Breathing and Patient connected to face mask oxygen  Post-op Assessment: Report given to RN and Patient moving all extremities X 4  Post vital signs: Reviewed and stable  Last Vitals:  Vitals Value Taken Time  BP 152/73 02/28/23 0919  Temp 35.6 C 02/28/23 0918  Pulse 72 02/28/23 0919  Resp 13 02/28/23 0919  SpO2 100 % 02/28/23 0919  Vitals shown include unfiled device data.  Last Pain:  Vitals:   02/28/23 0918  TempSrc: Temporal         Complications: No notable events documented.

## 2023-02-28 NOTE — Anesthesia Procedure Notes (Signed)
Procedure Name: General with mask airway Date/Time: 02/28/2023 8:47 AM  Performed by: Lily Lovings, CRNAPre-anesthesia Checklist: Patient identified, Emergency Drugs available, Suction available, Patient being monitored and Timeout performed Patient Re-evaluated:Patient Re-evaluated prior to induction Oxygen Delivery Method: Simple face mask Preoxygenation: Pre-oxygenation with 100% oxygen Induction Type: IV induction

## 2023-02-28 NOTE — Anesthesia Preprocedure Evaluation (Signed)
Anesthesia Evaluation  Patient identified by MRN, date of birth, ID band Patient awake    Reviewed: Allergy & Precautions, NPO status , Patient's Chart, lab work & pertinent test results, reviewed documented beta blocker date and time   History of Anesthesia Complications Negative for: history of anesthetic complications  Airway Mallampati: II  TM Distance: >3 FB Neck ROM: Full    Dental  (+) Dental Advisory Given   Pulmonary neg pulmonary ROS   Pulmonary exam normal breath sounds clear to auscultation       Cardiovascular hypertension, Pt. on medications and Pt. on home beta blockers (-) angina + CAD ('22 cath: non-obstructive ASCAD), + Peripheral Vascular Disease and +CHF (entresto)  negative cardio ROS Normal cardiovascular exam+ dysrhythmias Atrial Fibrillation  Rhythm:Regular Rate:Normal  '22 ECHO: EF 60-65%. The LV has normal function, no regional wall motion abnormalities. No significant valvular abnormalities    Neuro/Psych negative neurological ROS  negative psych ROS   GI/Hepatic negative GI ROS, Neg liver ROS,GERD  Medicated,,  Endo/Other  negative endocrine ROSdiabetes, Insulin Dependent    Renal/GU negative Renal ROS  negative genitourinary   Musculoskeletal   Abdominal   Peds  Hematology negative hematology ROS (+) eliquis   Anesthesia Other Findings Patient held her GLP 1 appropriately before her procedure.  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; left shoulder blade 05/2014: Cancer (HCC)     Comment:  basal cell removed left temple 2003: Cataract No date: Cellulitis and abscess of leg, except foot No date: Conjunctivitis unspecified No date: Dermatophytosis of nail No date: Diabetes mellitus     Comment:  type II No date: Elevated troponin No date: Esophageal reflux No date: Hyperlipidemia No date: Hypertension 2016: Osteoporosis No date: 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) No date: Renal artery stenosis (HCC) No date: Sprain of lumbar region No date: Thoracic or lumbosacral neuritis or radiculitis, unspecified No date: Urinary tract infection, site not specified  Past Surgical History: No date: APPENDECTOMY 09/25/2022: ATRIAL FIBRILLATION ABLATION; N/A     Comment:  Procedure: ATRIAL FIBRILLATION ABLATION;  Surgeon:               Lanier Prude, MD;  Location: [facility];                Service: Cardiovascular;  Laterality: N/A; No date: CARDIAC CATHETERIZATION 11/30/2020: CARDIOVERSION; N/A     Comment:  Procedure: CARDIOVERSION;  Surgeon: Antonieta Iba,               MD;  Location: [facility];  Service: Cardiovascular;                Laterality: N/A; 01/20/2021: CARDIOVERSION; N/A     Comment:  Procedure: CARDIOVERSION;  Surgeon: Antonieta Iba,               MD;  Location: [facility];  Service: Cardiovascular;                Laterality: N/A; 01/30/2021: CARDIOVERSION; N/A     Comment:  Procedure: CARDIOVERSION;  Surgeon: Iran Ouch,               MD;  Location: [facility];  Service: Cardiovascular;                Laterality: N/A; No date: cataract surgery No date: COLONOSCOPY 05/09/2016: COLONOSCOPY WITH PROPOFOL; N/A     Comment:  Procedure: COLONOSCOPY WITH PROPOFOL;  Surgeon: Earline Mayotte, MD;  Location: [facility];  Service:               Endoscopy;  Laterality: N/A; No date: DIAGNOSTIC MAMMOGRAM No date: EYE SURGERY; Bilateral     Comment:   Cataract Extraction 2016: JOINT REPLACEMENT 03/30/2015: KNEE ARTHROPLASTY; Right     Comment:  Procedure: COMPUTER ASSISTED TOTAL KNEE ARTHROPLASTY;                Surgeon: Donato Heinz, MD;  Location: [facility];                Service: Orthopedics;  Laterality: Right; No date: KNEE ARTHROSCOPY; Right 05/23/2015: KNEE CLOSED REDUCTION; Right     Comment:  Procedure: CLOSED MANIPULATION KNEE;  Surgeon: Donato Heinz, MD;  Location: [facility];  Service: Orthopedics;                Laterality: Right; 01/25/2021: RIGHT/LEFT HEART CATH AND CORONARY ANGIOGRAPHY; N/A     Comment:  Procedure: RIGHT/LEFT HEART CATH AND CORONARY               ANGIOGRAPHY;  Surgeon: Yvonne Kendall, MD;  Location:               [facility];  Service: Cardiovascular;                Laterality: N/A; No date: TONSILLECTOMY  BMI    Body Mass Index: 28.48 kg/m      Reproductive/Obstetrics negative OB ROS                             Anesthesia Physical Anesthesia Plan  ASA: 3  Anesthesia Plan: General   Post-op Pain Management: Minimal or no pain anticipated   Induction: Intravenous  PONV Risk Score and Plan: 3 and Propofol infusion, TIVA and Ondansetron  Airway Management Planned: Nasal Cannula  Additional Equipment: None  Intra-op Plan:   Post-operative Plan:   Informed Consent: I have reviewed the patients History and Physical, chart, labs and discussed the procedure including the risks, benefits and alternatives for the proposed anesthesia with the patient or authorized representative who has indicated his/her understanding and acceptance.     Dental advisory given  Plan Discussed with: CRNA and Surgeon  Anesthesia Plan Comments: (Discussed risks of anesthesia with patient, including possibility of difficulty with spontaneous ventilation under anesthesia necessitating airway intervention, PONV, and rare risks such as cardiac or respiratory or  neurological events, and allergic reactions. Discussed the role of CRNA in patient's perioperative care. Patient understands.)       Anesthesia Quick Evaluation

## 2023-02-28 NOTE — Op Note (Signed)
[facility] Gastroenterology Patient Name: Bonnie Rowland Procedure Date: 02/28/2023 8:28 AM MRN: [id number] Account #: [id number] Date of Birth: September 23, 1940 Admit Type: Outpatient Age: 82 Room: [facility] ENDO ROOM 1 Gender: Female Note Status: Finalized Instrument Name: Peds Colonoscope [id number] Procedure:             Colonoscopy Indications:           Chronic diarrhea Providers:             Trenda Moots, DO Referring MD:          Onnie Boer. Sowles, MD (Referring MD) Medicines:             Monitored Anesthesia Care Complications:         No immediate complications. Estimated blood loss:                         Minimal. Procedure:             Pre-Anesthesia Assessment:                        - Prior to the procedure, a History and Physical was                         performed, and patient medications and allergies were                         reviewed. The patient is competent. The risks and                         benefits of the procedure and the sedation options and                         risks were discussed with the patient. All questions                         were answered and informed consent was obtained.                         Patient identification and proposed procedure were                         verified by the physician, the nurse, the anesthetist                         and the technician in the endoscopy suite. Mental                         Status Examination: alert and oriented. Airway                         Examination: normal oropharyngeal airway and neck                         mobility. Respiratory Examination: clear to                         auscultation. CV Examination: RRR, no murmurs, no S3  or S4. Prophylactic Antibiotics: The patient does not                         require prophylactic antibiotics. Prior                         Anticoagulants: The patient has taken Eliquis                          (apixaban), last dose was 2 days prior to procedure.                         ASA Grade Assessment: III - A patient with severe                         systemic disease. After reviewing the risks and                         benefits, the patient was deemed in satisfactory                         condition to undergo the procedure. The anesthesia                         plan was to use monitored anesthesia care (MAC).                         Immediately prior to administration of medications,                         the patient was re-assessed for adequacy to receive                         sedatives. The heart rate, respiratory rate, oxygen                         saturations, blood pressure, adequacy of pulmonary                         ventilation, and response to care were monitored                         throughout the procedure. The physical status of the                         patient was re-assessed after the procedure.                        After obtaining informed consent, the colonoscope was                         passed under direct vision. Throughout the procedure,                         the patient's blood pressure, pulse, and oxygen                         saturations were monitored continuously. The  Colonoscope was introduced through the anus and                         advanced to the the terminal ileum, with                         identification of the appendiceal orifice and IC                         valve. The colonoscopy was performed without                         difficulty. The patient tolerated the procedure well.                         The quality of the bowel preparation was evaluated                         using the BBPS [facility] Bowel Preparation Scale) with                         scores of: Right Colon = 2 (minor amount of residual                         staining, small fragments of stool and/or opaque                          liquid, but mucosa seen well), Transverse Colon = 2                         (minor amount of residual staining, small fragments of                         stool and/or opaque liquid, but mucosa seen well) and                         Left Colon = 2 (minor amount of residual staining,                         small fragments of stool and/or opaque liquid, but                         mucosa seen well). The total BBPS score equals 6. The                         quality of the bowel preparation was good. The                         terminal ileum, ileocecal valve, appendiceal orifice,                         and rectum were photographed. Findings:      The perianal and digital rectal examinations were normal. Pertinent       negatives include normal sphincter tone.      The terminal ileum appeared normal. Estimated blood loss: none.      Normal mucosa was found in the entire colon.  Biopsies for histology were       taken with a cold forceps from the right colon and left colon for       evaluation of microscopic colitis. Estimated blood loss was minimal.      Multiple small-mouthed diverticula were found in the sigmoid colon.       Estimated blood loss: none.      A 1 mm polyp was found in the rectum. The polyp was sessile. The polyp       was removed with a jumbo cold forceps. Resection and retrieval were       complete. Estimated blood loss was minimal.      The exam was otherwise without abnormality on direct and retroflexion       views. Impression:            - The examined portion of the ileum was normal.                        - Normal mucosa in the entire examined colon. Biopsied.                        - Diverticulosis in the sigmoid colon.                        - One 1 mm polyp in the rectum, removed with a jumbo                         cold forceps. Resected and retrieved.                        - The examination was otherwise normal on direct and                          retroflexion views. Recommendation:        - Patient has a contact number available for                         emergencies. The signs and symptoms of potential                         delayed complications were discussed with the patient.                         Return to normal activities tomorrow. Written                         discharge instructions were provided to the patient.                        - Discharge patient to home.                        - Resume previous diet.                        - Continue present medications.                        - Resume Eliquis (apixaban) at prior dose tomorrow.  Refer to managing physician for further adjustment of                         therapy.                        - Await pathology results.                        - Return to GI office as previously scheduled.                        - The findings and recommendations were discussed with                         the patient. Procedure Code(s):     --- Professional ---                        [phone], Colonoscopy, flexible; with biopsy, single or                         multiple Diagnosis Code(s):     --- Professional ---                        D12.8, Benign neoplasm of rectum                        K52.9, Noninfective gastroenteritis and colitis,                         unspecified                        K57.30, Diverticulosis of large intestine without                         perforation or abscess without bleeding CPT copyright 2022 American Medical Association. All rights reserved. The codes documented in this report are preliminary and upon coder review may  be revised to meet current compliance requirements. Attending Participation:      I personally performed the entire procedure. Elfredia Nevins, DO Jaynie Collins DO, DO 02/28/2023 9:19:15 AM This report has been signed electronically. Number of Addenda: 0 Note Initiated On: 02/28/2023 8:28 AM Scope  Withdrawal Time: 0 hours 14 minutes 30 seconds  Total Procedure Duration: 0 hours 21 minutes 47 seconds  Estimated Blood Loss:  Estimated blood loss was minimal.      [facility]

## 2023-02-28 NOTE — H&P (Signed)
Pre-Procedure H&P   Patient ID: Bonnie Rowland is a 82 y.o. female.  Gastroenterology Provider: Jaynie Collins, DO  Referring Provider: Tawni Pummel, PA PCP: Alba Cory, MD  Date: 02/28/2023  HPI [name] is a 82 y.o. female who presents today for Colonoscopy for diarrhea . She reports 1 episode a week.  When she has these episodes she will have 6-7 bowel movements in a day.  No melena or hematochezia.  She has not identified any food triggers.  Her appetite and weight have been stable despite this.  She otherwise has 1-3 bowel movements in a day on normal days.  CT in 2021 demonstrated diverticulosis in the sigmoid Colonoscopy in 2017 was normal.  Random colon biopsies were negative for microscopic colitis Status post appendectomy Family history of CRC in her son  Hemoglobin 13 MCV 98.5 platelets 1-65,000 creatinine 0.92  Patient is on Bydureon which has been held for the procedure (last dose 02/17/23) Patient is on Eliquis which has been held for the procedure (last dose Monday evening)   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; left shoulder blade   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   Cataract 2003   Cellulitis and abscess of leg, except foot    Conjunctivitis unspecified    Dermatophytosis of nail    Diabetes mellitus    type II   Elevated troponin    Esophageal reflux    Hyperlipidemia    Hypertension    Osteoporosis 2016   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)    Renal artery  stenosis (HCC)    Sprain of lumbar region    Thoracic or lumbosacral neuritis or radiculitis, unspecified    Urinary tract infection, site not specified     Past Surgical History:  Procedure Laterality Date   APPENDECTOMY     ATRIAL FIBRILLATION ABLATION N/A 09/25/2022   Procedure: ATRIAL FIBRILLATION ABLATION;  Surgeon: Lanier Prude, MD;  Location: [facility] INVASIVE [facility] LAB;  Service: Cardiovascular;  Laterality: N/A;   CARDIAC CATHETERIZATION     CARDIOVERSION N/A 11/30/2020   Procedure: CARDIOVERSION;  Surgeon: Antonieta Iba, MD;  Location: [facility] ORS;  Service: Cardiovascular;  Laterality: N/A;   CARDIOVERSION N/A 01/20/2021   Procedure: CARDIOVERSION;  Surgeon: Antonieta Iba, MD;  Location: [facility] ORS;  Service: Cardiovascular;  Laterality: N/A;   CARDIOVERSION N/A 01/30/2021   Procedure: CARDIOVERSION;  Surgeon: Iran Ouch, MD;  Location: [facility] ORS;  Service: Cardiovascular;  Laterality: N/A;   cataract surgery     COLONOSCOPY     COLONOSCOPY WITH PROPOFOL N/A 05/09/2016   Procedure: COLONOSCOPY WITH PROPOFOL;  Surgeon: Earline Mayotte, MD;  Location: [facility] ENDOSCOPY;  Service: Endoscopy;  Laterality: N/A;   DIAGNOSTIC MAMMOGRAM     EYE SURGERY Bilateral    Cataract Extraction   JOINT REPLACEMENT  2016   KNEE ARTHROPLASTY Right 03/30/2015   Procedure: COMPUTER ASSISTED TOTAL KNEE ARTHROPLASTY;  Surgeon: Donato Heinz, MD;  Location: [facility] ORS;  Service: Orthopedics;  Laterality: Right;   KNEE ARTHROSCOPY Right    KNEE CLOSED REDUCTION Right 05/23/2015   Procedure: CLOSED MANIPULATION KNEE;  Surgeon: Donato Heinz, MD;  Location: [facility] ORS;  Service: Orthopedics;  Laterality: Right;   RIGHT/LEFT HEART CATH AND CORONARY ANGIOGRAPHY N/A 01/25/2021   Procedure: RIGHT/LEFT HEART CATH AND CORONARY ANGIOGRAPHY;  Surgeon: Yvonne Kendall, MD;  Location: [facility] INVASIVE CV LAB;  Service: Cardiovascular;  Laterality: N/A;   TONSILLECTOMY      Family History Son- CRC No other h/o GI  disease or malignancy  Review of Systems  Constitutional:  Negative for activity change, appetite change, chills, diaphoresis, fatigue, fever and unexpected weight change.  HENT:  Negative for trouble swallowing and voice change.   Respiratory:  Negative for shortness of breath and wheezing.   Cardiovascular:  Negative for chest pain, palpitations and leg swelling.  Gastrointestinal:  Positive for diarrhea. Negative for abdominal distention, abdominal pain, anal bleeding, blood in stool, constipation, nausea, rectal pain and vomiting.  Musculoskeletal:  Negative for arthralgias and myalgias.  Skin:  Negative for color change and pallor.  Neurological:  Negative for dizziness, syncope and weakness.  Psychiatric/Behavioral:  Negative for confusion.   All other systems reviewed and are negative.    Medications No current facility-administered medications on file prior to encounter.   Current Outpatient Medications on File Prior to Encounter  Medication Sig Dispense Refill   carvedilol (COREG) 6.25 MG tablet TAKE 1 TABLET BY MOUTH 2 TIMES DAILY WITH A MEAL. 180 tablet 0   acetaminophen (TYLENOL) 650 MG CR tablet Take 1,300 mg by mouth at bedtime.     ALFALFA PO Take 1 tablet by mouth daily.     amoxicillin (AMOXIL) 500 MG tablet Take 2,000 mg by mouth See admin instructions. Takes before dental procedures (Patient not taking: Reported on 02/18/2023)     ascorbic Acid (VITAMIN C) 500 MG CPCR Take 500 mg by mouth daily.     B Complex-C (B-COMPLEX WITH VITAMIN C) tablet Take 1 tablet by mouth daily.     BD PEN NEEDLE NANO U/F 32G X 4 MM MISC USE 4 TIMES DAILY (HUMALOG 3 TIMES DAILY AND LANTUS ONCE DAILY) OFFICE NOTIFIED 08/06/17 90 each 1   BLACK ELDERBERRY,BERRY-FLOWER, PO Take 15 mLs by mouth daily. Liquid     Calcium Carbonate (CALCIUM 600 PO) Take 600 mg by mouth daily.     cholecalciferol (VITAMIN D) 1000 UNITS tablet Take 1,000 Units by mouth daily. Shaklee     Coenzyme Q10 (COQ10) 200 MG  CAPS Take 200 mg by mouth daily.     Continuous Blood Gluc Sensor (FREESTYLE LIBRE SENSOR SYSTEM) MISC by Does not apply route.     Elastic Bandages & Supports (MEDICAL COMPRESSION STOCKINGS) MISC 1 each by Does not apply route daily. 2 each 5   ELIQUIS 5 MG TABS tablet TAKE 1 TABLET BY MOUTH TWICE A DAY 180 tablet 1   empagliflozin (JARDIANCE) 25 MG TABS tablet Take 25 mg by mouth daily.     furosemide (LASIX) 40 MG tablet TAKE 1 TABLET BY MOUTH TWICE A DAY 180 tablet 3   HUMALOG KWIKPEN 100 UNIT/ML KiwkPen Inject 2-6 Units into the skin 3 (three) times daily as needed (High blood sugar).  3   insulin glargine (LANTUS) 100 unit/mL SOPN Inject 12 Units into the skin at bedtime.     Krill Oil 1000 MG CAPS Take 1,000 mg by mouth daily.     Mag Aspart-Potassium Aspart (POTASSIUM &  MAGNESIUM ASPARTAT PO) Take 1 tablet by mouth daily.     Multiple Vitamins-Minerals (PRESERVISION AREDS 2) CHEW Chew 1 each by mouth 2 (two) times daily.     OVER THE COUNTER MEDICATION Place 1 application  into both eyes See admin instructions. Blephadex eyelid foam, apply to eyelids once daily     OVER THE COUNTER MEDICATION Take 1 tablet by mouth daily. Vita-Lea Gold otc supplement With out vitamin K (Shaklee products)     OVER THE COUNTER MEDICATION Take 1 tablet by mouth daily. Nutriferon otc supplement     Probiotic Product (PROBIOTIC PO) Take 1 capsule by mouth at bedtime.     PROLIA 60 MG/ML SOSY injection Inject 60 mg into the skin every 6 (six) months.     Propylene Glycol (SYSTANE BALANCE OP) Place 1 drop into both eyes daily as needed (dry eyes).     Sodium Chloride-Sodium Bicarb (NETI POT SINUS WASH NA) Place 1 Dose into the nose at bedtime.     tirzepatide (MOUNJARO) 10 MG/0.5ML Pen Inject 10 mg into the skin once a week. (Patient not taking: Reported on 02/18/2023)     Vitamin D, Ergocalciferol, (DRISDOL) 1.25 MG (50000 UT) CAPS capsule Take 50,000 Units by mouth every Saturday.     [DISCONTINUED]  doxycycline (VIBRA-TABS) 100 MG tablet Take 1 tablet (100 mg total) by mouth 2 (two) times daily. 20 tablet 0    Pertinent medications related to GI and procedure were reviewed by me with the patient prior to the procedure   Current Facility-Administered Medications:    0.9 %  sodium chloride infusion, , Intravenous, Continuous, Jaynie Collins, DO  sodium chloride         Allergies  Allergen Reactions   Flexeril [Cyclobenzaprine] Hypertension   Cheese Other (See Comments)    bloating   Ciprofloxacin Hcl Other (See Comments)    Muscle pain   Coconut (Cocos Nucifera) Other (See Comments)    Upset stomach   Keflex [Cephalexin] Other (See Comments)    Patient prefers not to take this medication due to the side effects, caused tendon issues   Allergies were reviewed by me prior to the procedure  Objective   Body mass index is 28.48 kg/m. Vitals:   02/28/23 0837  BP: (!) 160/55  Pulse: 67  Resp: 16  Temp: (!) 96.6 F (35.9 C)  TempSrc: Temporal  SpO2: 100%  Weight: 72.9 kg     Physical Exam Vitals and nursing note reviewed.  Constitutional:      General: She is not in acute distress.    Appearance: Normal appearance. She is not ill-appearing, toxic-appearing or diaphoretic.  HENT:     Head: Normocephalic and atraumatic.     Nose: Nose normal.     Mouth/Throat:     Mouth: Mucous membranes are moist.     Pharynx: Oropharynx is clear.  Eyes:     General: No scleral icterus.    Extraocular Movements: Extraocular movements intact.  Cardiovascular:     Rate and Rhythm: Normal rate and regular rhythm.     Heart sounds: Normal heart sounds. No murmur heard.    No friction rub. No gallop.  Pulmonary:     Effort: Pulmonary effort is normal. No respiratory distress.     Breath sounds: Normal breath sounds. No wheezing, rhonchi or rales.  Abdominal:     General: Bowel sounds are normal. There is no distension.     Palpations: Abdomen is soft.     Tenderness:  There is no abdominal tenderness. There is no guarding or rebound.  Musculoskeletal:     Cervical back: Neck supple.     Right lower leg: No edema.     Left lower leg: No edema.  Skin:    General: Skin is warm and dry.     Coloration: Skin is not jaundiced or pale.  Neurological:     General: No focal deficit present.     Mental Status: She is alert and oriented to person, place, and time. Mental status is at baseline.  Psychiatric:        Mood and Affect: Mood normal.        Behavior: Behavior normal.        Thought Content: Thought content normal.        Judgment: Judgment normal.      Assessment:  [name] is a 82 y.o. female  who presents today for Colonoscopy for diarrhea .  Plan:  Colonoscopy with possible intervention today  Colonoscopy with possible biopsy, control of bleeding, polypectomy, and interventions as necessary has been discussed with the patient/patient representative. Informed consent was obtained from the patient/patient representative after explaining the indication, nature, and risks of the procedure including but not limited to death, bleeding, perforation, missed neoplasm/lesions, cardiorespiratory compromise, and reaction to medications. Opportunity for questions was given and appropriate answers were provided. Patient/patient representative has verbalized understanding is amenable to undergoing the procedure.   Jaynie Collins, DO  [facility] Gastroenterology  Portions of the record may have been created with voice recognition software. Occasional wrong-word or 'sound-a-like' substitutions may have occurred due to the inherent limitations of voice recognition software.  Read the chart carefully and recognize, using context, where substitutions may have occurred.

## 2023-02-28 NOTE — Anesthesia Postprocedure Evaluation (Signed)
Anesthesia Post Note  Patient: Bonnie Rowland  Procedure(s) Performed: COLONOSCOPY BIOPSY  Patient location during evaluation: Endoscopy Anesthesia Type: General Level of consciousness: awake and alert Pain management: pain level controlled Vital Signs Assessment: post-procedure vital signs reviewed and stable Respiratory status: spontaneous breathing, nonlabored ventilation, respiratory function stable and patient connected to nasal cannula oxygen Cardiovascular status: blood pressure returned to baseline and stable Postop Assessment: no apparent nausea or vomiting Anesthetic complications: no  No notable events documented.   Last Vitals:  Vitals:   02/28/23 0918 02/28/23 0928  BP: (!) 152/73 (!) 113/55  Pulse:    Resp:  19  Temp: (!) 35.6 C   SpO2:      Last Pain:  Vitals:   02/28/23 0938  TempSrc:   PainSc: 0-No pain                 Stephanie Coup

## 2023-03-01 ENCOUNTER — Encounter: Payer: Self-pay | Admitting: Gastroenterology

## 2023-03-11 DIAGNOSIS — M5416 Radiculopathy, lumbar region: Secondary | ICD-10-CM | POA: Diagnosis not present

## 2023-03-11 DIAGNOSIS — M9903 Segmental and somatic dysfunction of lumbar region: Secondary | ICD-10-CM | POA: Diagnosis not present

## 2023-03-11 DIAGNOSIS — M9901 Segmental and somatic dysfunction of cervical region: Secondary | ICD-10-CM | POA: Diagnosis not present

## 2023-03-11 DIAGNOSIS — M5033 Other cervical disc degeneration, cervicothoracic region: Secondary | ICD-10-CM | POA: Diagnosis not present

## 2023-03-14 ENCOUNTER — Ambulatory Visit (INDEPENDENT_AMBULATORY_CARE_PROVIDER_SITE_OTHER): Payer: Medicare Other | Admitting: Podiatry

## 2023-03-14 DIAGNOSIS — Q828 Other specified congenital malformations of skin: Secondary | ICD-10-CM

## 2023-03-14 DIAGNOSIS — E119 Type 2 diabetes mellitus without complications: Secondary | ICD-10-CM | POA: Diagnosis not present

## 2023-03-18 ENCOUNTER — Encounter: Payer: Self-pay | Admitting: Podiatry

## 2023-03-18 NOTE — Progress Notes (Signed)
Subjective:  Patient ID: Bonnie Rowland, female    DOB: [birth year],  MRN: [id number]  Bonnie Rowland presents to clinic today for preventative diabetic foot care and painful porokeratotic lesions of both feet. Pain prevent(s) comfortable ambulation. Aggravating factor is weightbearing with and without shoegear.  Chief Complaint  Patient presents with   Painful porokeratoses bilaterally    DFC,Referring Provider Alba Cory, MD,lov:08/24,A1C:6.5   New problem(s): None.   PCP is Alba Cory, MD.  Allergies  Allergen Reactions   Flexeril [Cyclobenzaprine] Hypertension   Cheese Other (See Comments)    bloating   Ciprofloxacin Hcl Other (See Comments)    Muscle pain   Coconut (Cocos Nucifera) Other (See Comments)    Upset stomach   Keflex [Cephalexin] Other (See Comments)    Patient prefers not to take this medication due to the side effects, caused tendon issues    Review of Systems: Negative except as noted in the HPI.  Objective:  There were no vitals filed for this visit. Bonnie Rowland is a pleasant 81 y.o. female WD, WN in NAD. AAO x 3.  Vascular Examination: Capillary refill time <3 seconds b/l LE. Palpable pedal pulses b/l LE. Digital hair diminished b/l. No pedal edema b/l. Skin temperature gradient WNL b/l. No varicosities b/l. Marland Kitchen  Dermatological Examination: Pedal integument with normal turgor, texture and tone BLE. No open wounds b/l LE. No interdigital macerations noted b/l LE.   Toenails 1-5 b/l well maintained with adequate length. No erythema, no edema, no drainage, no fluctuance.   Porokeratotic lesion(s) right heel x 3 and  submet head 3 right foot. No erythema, no edema, no drainage, no fluctuance..  Neurological Examination: Protective sensation intact with 10 gram monofilament b/l LE. Vibratory sensation intact b/l LE.   Musculoskeletal Examination: Muscle strength 5/5 to all lower extremity muscle groups bilaterally. Plantarflexed  metatarsal(s) 5th metatarsal head b/l lower extremities.  Assessment/Plan: 1. Porokeratosis   2. Diabetes mellitus without complication (HCC)      -Consent given for treatment as described below: -Examined patient. -Continue supportive shoe gear daily. -Porokeratotic lesion(s) right heel x 3 and submet head 3 right foot pared and enucleated with sterile currette without incident. Total number of lesions debrided=4. -Patient/POA to call should there be question/concern in the interim.   Return in about 3 months (around 06/13/2023).  [name], DPM

## 2023-03-19 ENCOUNTER — Other Ambulatory Visit: Payer: Self-pay | Admitting: Cardiovascular Disease

## 2023-03-19 DIAGNOSIS — I480 Paroxysmal atrial fibrillation: Secondary | ICD-10-CM

## 2023-03-19 NOTE — Telephone Encounter (Signed)
Please review

## 2023-03-19 NOTE — Telephone Encounter (Signed)
Pt last saw Dr Lalla Brothers 12/27/22, last labs 02/18/23 Creat 0.96, age 82, weight 72.9kg, based on specified criteria pt is on appropriate dosage of Eliquis 5mg  BID for afib.  Will refill rx.

## 2023-03-25 NOTE — Progress Notes (Unsigned)
03/26/2023 8:59 AM   Bonnie Rowland March 05, 1941 [id number]  Referring provider: Alba Cory, MD [address] [place],  [place] [address]  Urological history: 1. Urge incontinence -cysto (2019) - NED -contributing factors of age, obesity, caffeine, diabetes, vaginal atrophy (using vaginal estrogen cream), ACE inhibitors, alpha blockers, antiarrhythmics and diuretics -Managed with poise pads and vaginal estrogen cream   2. Renal stone -contrast CT 2021 - 3 mm left upper renal stone  3. Renal cyst -contrast CT 2021 - 12 mm left renal inferior pole cyst  HPI: Bonnie Rowland is a 82 y.o. female who presents today for follow up.   Previous records reviewed.  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   At her visit on 01/18/2023, She contacted the office on December 27, 2022 stating that she had symptoms of UTI which she described as burning and lower abdominal pain.  Her UA that day was yellow clear, specific gravity 1.015, pH 7.0, 2+ glucose, 0-5 WBCs, 0-2 RBCs, 0-10 epithelial cells and a few bacteria.   Her urine culture grew out mixed urogenital flora.  She has been having chronic diarrhea as well.  She is scheduled for a colonoscopy in August.   Patient denies any modifying or aggravating factors.  Patient denies any recent UTI's, gross hematuria, dysuria or suprapubic/flank pain.  Patient denies any fevers, chills, nausea or vomiting.  She is applying vaginal estrogen cream three nights weekly.    She feels well today.  She no longer has a suprapubic pain or dysuria.  Patient denies any modifying or aggravating factors.  Patient denies any recent UTI's, gross hematuria, dysuria or suprapubic/flank pain.  Patient denies any fevers, chills, nausea or vomiting.    PVR 0 mL   UA yellow slightly cloudy, 2+ glucose, specific gravity 1.020, pH 7.0, 0-5 WBCs, 0-2 RBCs, 0-10 epithelial cells, Hiland casts present, mucus threads present, many bacteria and yeast  present.   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   Cataract 2003   Cellulitis and abscess of leg, except foot    Conjunctivitis unspecified    Dermatophytosis of nail    Diabetes mellitus    type II   Elevated troponin    Esophageal reflux    Hyperlipidemia    Hypertension    Osteoporosis 2016   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)    Renal artery stenosis (HCC)    Sprain of lumbar region    Thoracic or lumbosacral neuritis or radiculitis, unspecified    Urinary tract infection, site not specified     Surgical History: Past Surgical History:  Procedure Laterality Date   APPENDECTOMY     ATRIAL FIBRILLATION ABLATION N/A 09/25/2022   Procedure: ATRIAL FIBRILLATION ABLATION;  Surgeon: Lanier Prude, MD;  Location: [facility];  Service: Cardiovascular;  Laterality: N/A;   BIOPSY  02/28/2023   Procedure: BIOPSY;  Surgeon: Jaynie Collins, DO;  Location: [facility] END[facility];  Service: Gastroenterology;;   CARDIAC CATHETERIZATION     CARDIOVERSION N/A 11/30/2020   Procedure: CARDIOVERSION;  Surgeon: Antonieta Iba, MD;  Location: [facility] OR[facility];  Service: Cardiovascular;  Laterality: N/A;   CARDIOVERSION N/A 01/20/2021   Procedure: CARDIOVERSION;  Surgeon: Antonieta Iba, MD;  Location: [facility];  Service: Cardiovascular;  Laterality: N/A;   CARDIOVERSION N/A 01/30/2021   Procedure: CARDIOVERSION;  Surgeon: Iran Ouch, MD;  Location: [facility] OR[facility];   Service: Cardiovascular;  Laterality: N/A;   cataract surgery     COLONOSCOPY     COLONOSCOPY N/A 02/28/2023   Procedure: COLONOSCOPY;  Surgeon: Jaynie Collins, DO;  Location: [facility] ENDOSCOPY;  Service: Gastroenterology;  Laterality: N/A;   COLONOSCOPY WITH PROPOFOL N/A 05/09/2016   Procedure: COLONOSCOPY WITH PROPOFOL;  Surgeon: Earline Mayotte, MD;  Location: [facility] ENDOSCOPY;  Service: Endoscopy;  Laterality: N/A;   DIAGNOSTIC MAMMOGRAM     EYE SURGERY Bilateral    Cataract Extraction   JOINT REPLACEMENT  2016   KNEE ARTHROPLASTY Right 03/30/2015   Procedure: COMPUTER ASSISTED TOTAL KNEE ARTHROPLASTY;  Surgeon: Donato Heinz, MD;  Location: [facility] ORS;  Service: Orthopedics;  Laterality: Right;   KNEE ARTHROSCOPY Right    KNEE CLOSED REDUCTION Right 05/23/2015   Procedure: CLOSED MANIPULATION KNEE;  Surgeon: Donato Heinz, MD;  Location: [facility] OR[facility];  Service: Orthopedics;  Laterality: Right;   POLYPECTOMY  02/28/2023   Procedure: POLYPECTOMY;  Surgeon: Jaynie Collins, DO;  Location: [facility] ENDOSCOPY;  Service: Gastroenterology;;   RIGHT/LEFT HEART CATH AND CORONARY ANGIOGRAPHY N/A 01/25/2021   Procedure: RIGHT/LEFT HEART CATH AND CORONARY ANGIOGRAPHY;  Surgeon: Yvonne Kendall, MD;  Location: [facility];  Service: Cardiovascular;  Laterality: N/A;   TONSILLECTOMY      Home Medications:  Allergies as of 03/26/2023       Reactions   Flexeril [cyclobenzaprine] Hypertension   Cheese Other (See Comments)   bloating   Ciprofloxacin Hcl Other (See Comments)   Muscle pain   Coconut (cocos Nucifera) Other (See Comments)   Upset stomach   Keflex [cephalexin] Other (See Comments)   Patient prefers not to take this medication due to the side effects, caused tendon issues        Medication List        Accurate as of March 26, 2023  8:59 AM. If you have any questions, ask your nurse or doctor.          acetaminophen 650 MG CR tablet Commonly known as:  TYLENOL Take 1,300 mg by mouth at bedtime.   ALFALFA PO Take 1 tablet by mouth daily.   amoxicillin 500 MG tablet Commonly known as: AMOXIL Take 2,000 mg by mouth See admin instructions. Takes before dental procedures   ascorbic Acid 500 MG Cpcr Commonly known as: VITAMIN C Take 500 mg by mouth daily.   B-complex with vitamin C tablet Take 1 tablet by mouth daily.   BD Pen Needle Nano U/F 32G X 4 MM Misc Generic drug: Insulin Pen Needle USE 4 TIMES DAILY (HUMALOG 3 TIMES DAILY AND LANTUS ONCE DAILY) OFFICE NOTIFIED 08/06/17   BLACK ELDERBERRY(BERRY-FLOWER) PO Take 15 mLs by mouth daily. Liquid   CALCIUM 600 PO Take 600 mg by mouth daily.   carvedilol 6.25 MG tablet Commonly known as: COREG TAKE 1 TABLET BY MOUTH 2 TIMES DAILY WITH A MEAL.   cholecalciferol 1000 units tablet Commonly known as: VITAMIN D Take 1,000 Units by mouth daily. Shaklee   CoQ10 200 MG  Caps Take 200 mg by mouth daily.   Eliquis 5 MG Tabs tablet Generic drug: apixaban TAKE 1 TABLET BY MOUTH TWICE A DAY   empagliflozin 25 MG Tabs tablet Commonly known as: JARDIANCE Take 25 mg by mouth daily.   Entresto 49-51 MG Generic drug: sacubitril-valsartan TAKE 1 TABLET BY MOUTH TWICE A DAY   estradiol 0.1 MG/GM vaginal cream Commonly known as: ESTRACE APPLY 1/2 GM ONCE WEEKLY USING APPLICATOR, APPLY BLUEBERRY SIZED AMOUNT OF CREAM USING TIP OF FINGER TO URETHRA TWICE WEEKLY   FreeStyle Libre Sensor System Misc by Does not apply route.   furosemide 40 MG tablet Commonly known as: LASIX TAKE 1 TABLET BY MOUTH TWICE A DAY   HumaLOG KwikPen 100 UNIT/ML KwikPen Generic drug: insulin lispro Inject 2-6 Units into the skin 3 (three) times daily as needed (High blood sugar).   insulin glargine 100 unit/mL Sopn Commonly known as: LANTUS Inject 12 Units into the skin at bedtime.   Krill Oil 1000 MG Caps Take 1,000 mg by mouth daily.   Medical Compression Stockings Misc 1 each by Does not apply  route daily.   Mounjaro 10 MG/0.5ML Pen Generic drug: tirzepatide Inject 10 mg into the skin once a week.   NETI POT SINUS WASH NA Place 1 Dose into the nose at bedtime.   nystatin cream Commonly known as: MYCOSTATIN Apply 1 Application topically 2 (two) times daily. Started by: Michiel Cowboy   OVER THE COUNTER MEDICATION Place 1 application  into both eyes See admin instructions. Blephadex eyelid foam, apply to eyelids once daily   OVER THE COUNTER MEDICATION Take 1 tablet by mouth daily. Vita-Lea Gold otc supplement With out vitamin K (Shaklee products)   OVER THE COUNTER MEDICATION Take 1 tablet by mouth daily. Nutriferon otc supplement   POTASSIUM & MAGNESIUM ASPARTAT PO Take 1 tablet by mouth daily.   PreserVision AREDS 2 Chew Chew 1 each by mouth 2 (two) times daily.   PROBIOTIC PO Take 1 capsule by mouth at bedtime.   Prolia 60 MG/ML Sosy injection Generic drug: denosumab Inject 60 mg into the skin every 6 (six) months.   rosuvastatin 10 MG tablet Commonly known as: CRESTOR Take 1 tablet (10 mg total) by mouth daily.   SYSTANE BALANCE OP Place 1 drop into both eyes daily as needed (dry eyes).   tiZANidine 2 MG tablet Commonly known as: ZANAFLEX Take 1-2 tablets (2-4 mg total) by mouth at bedtime.   Vitamin D (Ergocalciferol) 1.25 MG (50000 UNIT) Caps capsule Commonly known as: DRISDOL Take 50,000 Units by mouth every Saturday.        Allergies:  Allergies  Allergen Reactions   Flexeril [Cyclobenzaprine] Hypertension   Cheese Other (See Comments)    bloating   Ciprofloxacin Hcl Other (See Comments)    Muscle pain   Coconut (Cocos Nucifera) Other (See Comments)    Upset stomach   Keflex [Cephalexin] Other (See Comments)    Patient prefers not to take this medication due to the side effects, caused tendon issues    Family History: Family History  Problem Relation Age of Onset   Diabetes Mother    Hypertension Mother    Cancer Mother     Kidney disease Mother    Hypertension Father    Cancer Father        Prostate   Breast cancer Maternal Aunt 33   Colon cancer Son    Kidney cancer Neg Hx    Bladder Cancer Neg Hx  Social History:  reports that she has never smoked. She has never used smokeless tobacco. She reports that she does not currently use alcohol after a past usage of about 1.0 standard drink of alcohol per week. She reports that she does not use drugs.  ROS: For pertinent review of systems please refer to history of present illness  Physical Exam: BP 129/72   Pulse 64   Ht 5\' 3"  (1.6 m)   Wt 160 lb (72.6 kg)   BMI 28.34 kg/m   Constitutional:  Well nourished. Alert and oriented, No acute distress. HEENT: [place] AT, moist mucus membranes.  Trachea midline Cardiovascular: No clubbing, cyanosis, or edema. Respiratory: Normal respiratory effort, no increased work of breathing. Neurologic: Grossly intact, no focal deficits, moving all 4 extremities. Psychiatric: Normal mood and affect.    Laboratory Data: Urinalysis: See HPI and EPIC  I have reviewed the labs.   Pertinent Imaging  03/26/23 08:38  Scan Result 0    Assessment and plan:  1.  Urge incontinence -at baseline -not bothersome -continue conservative management -Explained the findings of bacteria and yeast on her urinalysis and since she is not having UTI symptoms, there is no concerns, urine is not sent for culture  2. Vaginal Atrophy -continue to apply the vaginal estrogen cream 3 nights weekly  3. Suprapubic pain/dysuria -resolved   Return for keep follow up in January .  These notes generated with voice recognition software. I apologize for typographical errors.  Cloretta Ned [facility] [address] [place], [place] [address] [phone]

## 2023-03-26 ENCOUNTER — Ambulatory Visit (INDEPENDENT_AMBULATORY_CARE_PROVIDER_SITE_OTHER): Payer: Medicare Other | Admitting: Urology

## 2023-03-26 ENCOUNTER — Encounter: Payer: Self-pay | Admitting: Urology

## 2023-03-26 VITALS — BP 129/72 | HR 64 | Ht 63.0 in | Wt 160.0 lb

## 2023-03-26 DIAGNOSIS — R300 Dysuria: Secondary | ICD-10-CM

## 2023-03-26 DIAGNOSIS — N952 Postmenopausal atrophic vaginitis: Secondary | ICD-10-CM

## 2023-03-26 DIAGNOSIS — N3941 Urge incontinence: Secondary | ICD-10-CM | POA: Diagnosis not present

## 2023-03-26 DIAGNOSIS — B356 Tinea cruris: Secondary | ICD-10-CM

## 2023-03-26 LAB — URINALYSIS, COMPLETE
Bilirubin, UA: NEGATIVE
Ketones, UA: NEGATIVE
Leukocytes,UA: NEGATIVE
Nitrite, UA: NEGATIVE
Protein,UA: NEGATIVE
RBC, UA: NEGATIVE
Specific Gravity, UA: 1.02 (ref 1.005–1.030)
Urobilinogen, Ur: 0.2 mg/dL (ref 0.2–1.0)
pH, UA: 7 (ref 5.0–7.5)

## 2023-03-26 LAB — MICROSCOPIC EXAMINATION

## 2023-03-26 LAB — BLADDER SCAN AMB NON-IMAGING: Scan Result: 0

## 2023-03-26 MED ORDER — NYSTATIN 100000 UNIT/GM EX CREA: 1.0000 | TOPICAL_CREAM | Freq: Two times a day (BID) | CUTANEOUS | 0 refills | Status: DC

## 2023-03-27 DIAGNOSIS — Z01818 Encounter for other preprocedural examination: Secondary | ICD-10-CM | POA: Diagnosis not present

## 2023-03-27 DIAGNOSIS — D3A8 Other benign neuroendocrine tumors: Secondary | ICD-10-CM | POA: Diagnosis not present

## 2023-03-27 DIAGNOSIS — E1165 Type 2 diabetes mellitus with hyperglycemia: Secondary | ICD-10-CM | POA: Diagnosis not present

## 2023-03-27 DIAGNOSIS — I480 Paroxysmal atrial fibrillation: Secondary | ICD-10-CM | POA: Diagnosis not present

## 2023-03-27 DIAGNOSIS — Z794 Long term (current) use of insulin: Secondary | ICD-10-CM | POA: Diagnosis not present

## 2023-03-28 DIAGNOSIS — H60532 Acute contact otitis externa, left ear: Secondary | ICD-10-CM | POA: Diagnosis not present

## 2023-03-29 DIAGNOSIS — Z23 Encounter for immunization: Secondary | ICD-10-CM | POA: Diagnosis not present

## 2023-04-02 DIAGNOSIS — Z860100 Personal history of colon polyps, unspecified: Secondary | ICD-10-CM | POA: Diagnosis not present

## 2023-04-02 DIAGNOSIS — D3A026 Benign carcinoid tumor of the rectum: Secondary | ICD-10-CM | POA: Diagnosis not present

## 2023-04-02 DIAGNOSIS — I4891 Unspecified atrial fibrillation: Secondary | ICD-10-CM | POA: Diagnosis not present

## 2023-04-02 DIAGNOSIS — I5030 Unspecified diastolic (congestive) heart failure: Secondary | ICD-10-CM | POA: Diagnosis not present

## 2023-04-02 DIAGNOSIS — I2510 Atherosclerotic heart disease of native coronary artery without angina pectoris: Secondary | ICD-10-CM | POA: Diagnosis not present

## 2023-04-02 DIAGNOSIS — Z9049 Acquired absence of other specified parts of digestive tract: Secondary | ICD-10-CM | POA: Diagnosis not present

## 2023-04-02 DIAGNOSIS — E119 Type 2 diabetes mellitus without complications: Secondary | ICD-10-CM | POA: Diagnosis not present

## 2023-04-02 DIAGNOSIS — Z79899 Other long term (current) drug therapy: Secondary | ICD-10-CM | POA: Diagnosis not present

## 2023-04-02 DIAGNOSIS — Z7984 Long term (current) use of oral hypoglycemic drugs: Secondary | ICD-10-CM | POA: Diagnosis not present

## 2023-04-02 DIAGNOSIS — K6289 Other specified diseases of anus and rectum: Secondary | ICD-10-CM | POA: Diagnosis not present

## 2023-04-02 DIAGNOSIS — Z7901 Long term (current) use of anticoagulants: Secondary | ICD-10-CM | POA: Diagnosis not present

## 2023-04-02 DIAGNOSIS — Z794 Long term (current) use of insulin: Secondary | ICD-10-CM | POA: Diagnosis not present

## 2023-04-02 DIAGNOSIS — Z9889 Other specified postprocedural states: Secondary | ICD-10-CM | POA: Diagnosis not present

## 2023-04-02 DIAGNOSIS — I110 Hypertensive heart disease with heart failure: Secondary | ICD-10-CM | POA: Diagnosis not present

## 2023-04-09 DIAGNOSIS — M810 Age-related osteoporosis without current pathological fracture: Secondary | ICD-10-CM | POA: Diagnosis not present

## 2023-04-11 ENCOUNTER — Ambulatory Visit: Payer: Medicare Other

## 2023-04-15 DIAGNOSIS — M9903 Segmental and somatic dysfunction of lumbar region: Secondary | ICD-10-CM | POA: Diagnosis not present

## 2023-04-15 DIAGNOSIS — M9901 Segmental and somatic dysfunction of cervical region: Secondary | ICD-10-CM | POA: Diagnosis not present

## 2023-04-15 DIAGNOSIS — M5416 Radiculopathy, lumbar region: Secondary | ICD-10-CM | POA: Diagnosis not present

## 2023-04-15 DIAGNOSIS — M5033 Other cervical disc degeneration, cervicothoracic region: Secondary | ICD-10-CM | POA: Diagnosis not present

## 2023-04-19 ENCOUNTER — Encounter: Payer: Self-pay | Admitting: Family

## 2023-04-19 ENCOUNTER — Ambulatory Visit
Admission: RE | Admit: 2023-04-19 | Discharge: 2023-04-19 | Disposition: A | Discharge status: Home / Self Care | Payer: Medicare Other | Source: Ambulatory Visit | Attending: Family

## 2023-04-19 DIAGNOSIS — I5032 Chronic diastolic (congestive) heart failure: Secondary | ICD-10-CM

## 2023-04-19 DIAGNOSIS — I10 Essential (primary) hypertension: Secondary | ICD-10-CM

## 2023-04-19 DIAGNOSIS — I480 Paroxysmal atrial fibrillation: Secondary | ICD-10-CM | POA: Insufficient documentation

## 2023-04-19 DIAGNOSIS — E785 Hyperlipidemia, unspecified: Secondary | ICD-10-CM | POA: Diagnosis not present

## 2023-04-19 DIAGNOSIS — E1169 Type 2 diabetes mellitus with other specified complication: Secondary | ICD-10-CM

## 2023-04-19 DIAGNOSIS — I5031 Acute diastolic (congestive) heart failure: Secondary | ICD-10-CM

## 2023-04-19 DIAGNOSIS — I110 Hypertensive heart disease with heart failure: Secondary | ICD-10-CM | POA: Diagnosis not present

## 2023-04-19 LAB — ECHOCARDIOGRAM COMPLETE
AR max vel: 1.99 cm2
AV Area VTI: 2.14 cm2
AV Area mean vel: 2.03 cm2
AV Mean grad: 4 mm[Hg]
AV Peak grad: 8.3 mm[Hg]
Ao pk vel: 1.44 m/s
Area-P 1/2: 3.79 cm2
S' Lateral: 2.4 cm

## 2023-04-19 NOTE — Progress Notes (Signed)
*  PRELIMINARY RESULTS* Echocardiogram 2D Echocardiogram has been performed.  Bonnie Rowland 04/19/2023, 9:54 AM

## 2023-04-22 ENCOUNTER — Telehealth: Payer: Self-pay | Admitting: Cardiovascular Disease

## 2023-04-22 MED ORDER — CARVEDILOL 6.25 MG PO TABS: 6.2500 mg | ORAL_TABLET | Freq: Two times a day (BID) | ORAL | 0 refills | Status: DC

## 2023-04-22 NOTE — Telephone Encounter (Signed)
*  STAT* If patient is at the pharmacy, call can be transferred to refill team.   1. Which medications need to be refilled? (please list name of each medication and dose if known)   carvedilol (COREG) 6.25 MG tablet   2. Would you like to learn more about the convenience, safety, & potential cost savings by using the [facility]?   3. Are you open to using the [facility] (Type [facility]. ).  4. Which pharmacy/location (including street and city if local pharmacy) is medication to be sent to?  [id number] [address], [place] [phone]   5. Do they need a 30 day or 90 day supply?   90 day  Patient stated she has about a week's worth of medication left. Patient has appointment scheduled on 11/19.

## 2023-04-23 DIAGNOSIS — D225 Melanocytic nevi of trunk: Secondary | ICD-10-CM | POA: Diagnosis not present

## 2023-04-23 DIAGNOSIS — L820 Inflamed seborrheic keratosis: Secondary | ICD-10-CM | POA: Diagnosis not present

## 2023-04-23 DIAGNOSIS — Z85820 Personal history of malignant melanoma of skin: Secondary | ICD-10-CM | POA: Diagnosis not present

## 2023-04-23 DIAGNOSIS — D2262 Melanocytic nevi of left upper limb, including shoulder: Secondary | ICD-10-CM | POA: Diagnosis not present

## 2023-04-23 DIAGNOSIS — D2271 Melanocytic nevi of right lower limb, including hip: Secondary | ICD-10-CM | POA: Diagnosis not present

## 2023-04-23 DIAGNOSIS — Z85828 Personal history of other malignant neoplasm of skin: Secondary | ICD-10-CM | POA: Diagnosis not present

## 2023-04-23 DIAGNOSIS — L570 Actinic keratosis: Secondary | ICD-10-CM | POA: Diagnosis not present

## 2023-04-23 DIAGNOSIS — L538 Other specified erythematous conditions: Secondary | ICD-10-CM | POA: Diagnosis not present

## 2023-04-23 DIAGNOSIS — D2261 Melanocytic nevi of right upper limb, including shoulder: Secondary | ICD-10-CM | POA: Diagnosis not present

## 2023-04-23 DIAGNOSIS — L821 Other seborrheic keratosis: Secondary | ICD-10-CM | POA: Diagnosis not present

## 2023-04-23 DIAGNOSIS — D2272 Melanocytic nevi of left lower limb, including hip: Secondary | ICD-10-CM | POA: Diagnosis not present

## 2023-04-25 DIAGNOSIS — Z23 Encounter for immunization: Secondary | ICD-10-CM | POA: Diagnosis not present

## 2023-04-25 NOTE — Progress Notes (Signed)
PCP: Alba Cory, MD (last seen 08/24) Primary Cardiologist: Wallis Bamberg, NP (last seen 02/24) EP: Steffanie Dunn, MD (last seen 06/24)    HPI:  Bonnie Rowland is a 82 y/o female with a history of HTN, DM, hyperlipidemia, pulmonary HTN, renal artery stenosis, atrial fibrillation/ flutter, GERD and chronic heart failure.   Admitted in October 2023 with chest discomfort and palpitations.  She was found to be in atrial fibrillation with rapid ventricular rates.  She was cardioverted in the emergency department back to sinus rhythm but had ERAF.  She was sedated again during that ER visit and another cardioversion attempt was performed but was unsuccessful.   She had an A-fib ablation September 25, 2022 during which the veins, posterior wall and CTI were ablated.   Echo 02/25/15: EF 60-65% with mild MR, mild LAE Echo 10/20/20: EF 60-65% with mild LAE, mild MR Echo 01/23/21: EF 45-50% with mild LVH, severe hypokinesis of left ventricle and trivial MR.  Echo 03/31/21: EF 60-65% without structural changes.  Echo 04/19/23: EF 60-65% with mild LVH, Grade II DD, mild LAE, mild MR  RHC/LHC done 01/25/21 and showed: Mild to moderate, non-obstructive coronary artery disease, including 30% mid LAD disease, 50-60% mid/distal LCx stenosis, and 60% proximal/mid RCA lesion. Mildly to moderately reduced left ventricular systolic function with mid anterior hypo/akinesis; query atypical Takotsubo variant.  LVEF ~45%. Mildly elevated left heart filling pressures. Moderately elevated right heart filling and pulmonary artery pressures with significant pulmonary vascular resistance. Mildly reduced Fick cardiac output/index.  Cardiac CT 09/18/22: Small foci of subsegmental atelectasis at the anterior lung bases and in LEFT lower lobe.  Had colonoscopy 02/28/23  She presents today for a HF follow-up visit with a chief complaint of minimal shortness of breath. Has associated minimal pedal edema and fatigue along with  this. Denies chest pain, cough, palpitations, abdominal distention, dizziness, weight gain or difficulty sleeping. 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 Did completely forget to take her diuretic 2 days ago but says that rarely happens.   Has received her flu / covid vaccines for this season.   Walking daily (gets ~ 3000 steps in daily) and doing tai chi for exercise.   ROS: All systems negative except as listed in HPI, PMH and Problem List.  SH:  Social History   Socioeconomic History   Marital status: Married    Spouse name: Camera operator   Number of children: 1   Years of education: Not on file   Highest education level: Master's degree (e.g., MA, Bonnie, MEng, MEd, MSW, MBA)  Occupational History   Occupation: Retired  Tobacco Use   Smoking status: Never   Smokeless tobacco: Never   Tobacco comments:    Never smoke 10/23/22  Vaping Use   Vaping status: Never Used  Substance and Sexual Activity   Alcohol use: Not Currently    Alcohol/week: 1.0 standard drink of alcohol    Types: 1 Glasses of wine per week    Comment: RARELY   Drug use: No   Sexual activity: Yes    Partners: Male    Birth control/protection: Post-menopausal  Other Topics Concern   Not on file  Social History Narrative   She is married , only has one son and he was diagnosed with colon cancer at age 57.    Social Determinants of Health   Financial Resource Strain:  Low Risk  (04/26/2022)   Overall Financial Resource Strain (CARDIA)    Difficulty of Paying Living Expenses: Not hard at all  Food Insecurity: No Food Insecurity (04/26/2022)   Hunger Vital Sign    Worried About Running Out of Food in the Last Year: Never true    Ran Out of Food in the Last Year: Never true  Transportation Needs: No Transportation Needs (04/26/2022)   PRAPARE - Administrator, Civil Service (Medical): No    Lack of  Transportation (Non-Medical): No  Physical Activity: Sufficiently Active (04/26/2022)   Exercise Vital Sign    Days of Exercise per Week: 7 days    Minutes of Exercise per Session: 30 min  Stress: No Stress Concern Present (04/26/2022)   [employer] of Occupational Health - Occupational Stress Questionnaire    Feeling of Stress : Not at all  Social Connections: Socially Integrated (04/26/2022)   Social Connection and Isolation Panel [NHANES]    Frequency of Communication with Friends and Family: More than three times a week    Frequency of Social Gatherings with Friends and Family: More than three times a week    Attends Religious Services: More than 4 times per year    Active Member of [employer] or Organizations: Yes    Attends Engineer, structural: More than 4 times per year    Marital Status: Married  Catering manager Violence: Not At Risk (04/26/2022)   Humiliation, Afraid, Rape, and Kick questionnaire    Fear of Current or Ex-Partner: No    Emotionally Abused: No    Physically Abused: No    Sexually Abused: No    FH:  Family History  Problem Relation Age of Onset   Diabetes Mother    Hypertension Mother    Cancer Mother    Kidney disease Mother    Hypertension Father    Cancer Father        Prostate   Breast cancer Maternal Aunt 40   Colon cancer Son    Kidney cancer Neg Hx    Bladder Cancer Neg Hx     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; left shoulder blade   Cancer (HCC) 05/2014   basal cell removed left  temple   Cataract 2003   Cellulitis and abscess of leg, except foot    Conjunctivitis unspecified    Dermatophytosis of nail    Diabetes mellitus    type II   Elevated troponin    Esophageal reflux    Hyperlipidemia    Hypertension    Osteoporosis 2016   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     ALFALFA PO Take 1  tablet by mouth daily.     amoxicillin (AMOXIL) 500 MG tablet Take 2,000 mg by mouth See admin instructions. Takes before dental procedures     ascorbic Acid (VITAMIN C) 500 MG CPCR Take 500 mg by mouth daily.     B Complex-C (B-COMPLEX WITH VITAMIN C) tablet Take 1 tablet by mouth daily.     BD PEN NEEDLE NANO U/F 32G X 4 MM MISC USE 4 TIMES DAILY (HUMALOG 3 TIMES DAILY AND LANTUS ONCE DAILY) OFFICE NOTIFIED 08/06/17 90 each 1   BLACK ELDERBERRY,BERRY-FLOWER, PO Take 15 mLs by mouth daily. Liquid     Calcium Carbonate (CALCIUM 600 PO) Take 600 mg by mouth daily.     carvedilol (COREG) 6.25 MG tablet Take 1 tablet (6.25 mg total) by mouth 2 (two) times daily with a meal. 180 tablet 0   cholecalciferol (VITAMIN D) 1000 UNITS tablet Take 1,000 Units by mouth daily. Shaklee     Coenzyme Q10 (COQ10) 200 MG CAPS Take 200 mg by mouth daily.     Continuous Blood Gluc Sensor (FREESTYLE LIBRE SENSOR SYSTEM) MISC by Does not apply route.     Elastic Bandages & Supports (MEDICAL COMPRESSION STOCKINGS) MISC 1 each by Does not apply route daily. 2 each 5   ELIQUIS 5 MG TABS tablet TAKE 1 TABLET BY MOUTH TWICE A DAY 180 tablet 1   empagliflozin (JARDIANCE) 25 MG TABS tablet Take 25 mg by mouth daily.     ENTRESTO 49-51 MG TAKE 1 TABLET BY MOUTH TWICE A DAY 180 tablet 3   estradiol  (ESTRACE) 0.1 MG/GM vaginal cream APPLY 1/2 GM ONCE WEEKLY USING APPLICATOR, APPLY BLUEBERRY SIZED AMOUNT OF CREAM USING TIP OF FINGER TO URETHRA TWICE WEEKLY 42.5 g 3   furosemide (LASIX) 40 MG tablet TAKE 1 TABLET BY MOUTH TWICE A DAY 180 tablet 3   HUMALOG KWIKPEN 100 UNIT/ML KiwkPen Inject 2-6 Units into the skin 3 (three) times daily as needed (High blood sugar).  3   insulin glargine (LANTUS) 100 unit/mL SOPN Inject 12 Units into the skin at bedtime.     Krill Oil 1000 MG CAPS Take 1,000 mg by mouth daily.     Mag Aspart-Potassium Aspart (POTASSIUM & MAGNESIUM ASPARTAT PO) Take 1 tablet by mouth daily.     Multiple Vitamins-Minerals (PRESERVISION AREDS 2) CHEW Chew 1 each by mouth 2 (two) times daily.     nystatin cream (MYCOSTATIN) Apply 1 Application topically 2 (two) times daily. 30 g 0   OVER THE COUNTER MEDICATION Place 1 application  into both eyes See admin instructions. Blephadex eyelid foam, apply to eyelids once daily     OVER THE COUNTER MEDICATION Take 1 tablet by mouth daily. Vita-Lea Gold otc supplement With out vitamin K (Shaklee products)     OVER THE COUNTER MEDICATION Take 1 tablet by mouth daily. Nutriferon otc supplement     Probiotic Product (PROBIOTIC PO) Take 1 capsule by mouth at bedtime.     PROLIA 60 MG/ML SOSY injection Inject 60 mg into the skin every 6 (six) months.     Propylene Glycol (SYSTANE BALANCE OP) Place 1 drop into both eyes daily as needed (dry eyes).     rosuvastatin (CRESTOR) 10 MG tablet Take 1 tablet (10 mg total) by mouth daily. 90 tablet 1   Sodium Chloride-Sodium Bicarb (NETI POT SINUS WASH NA) Place 1 Dose into the nose at bedtime.     tirzepatide (MOUNJARO) 10 MG/0.5ML Pen Inject 10 mg into the  skin once a week.     tiZANidine (ZANAFLEX) 2 MG tablet Take 1-2 tablets (2-4 mg total) by mouth at bedtime. 180 tablet 1   Vitamin D, Ergocalciferol, (DRISDOL) 1.25 MG (50000 UT) CAPS capsule Take 50,000 Units by mouth every Saturday.     No current  facility-administered medications for this visit.   Vitals:   04/26/23 1056  BP: (!) 117/40  Pulse: 65  SpO2: 98%  Weight: 159 lb (72.1 kg)   Wt Readings from Last 3 Encounters:  04/26/23 159 lb (72.1 kg)  03/26/23 160 lb (72.6 kg)  02/28/23 160 lb 12.8 oz (72.9 kg)   Lab Results  Component Value Date   CREATININE 0.96 02/18/2023   CREATININE 0.92 10/17/2022   CREATININE 0.92 09/17/2022   PHYSICAL EXAM:  General:  Well appearing. No resp difficulty HEENT: normal Neck: supple. JVP flat. No lymphadenopathy or thryomegaly appreciated. Cor: PMI normal. Regular rate & rhythm. No rubs, gallops or murmurs. Lungs: clear Abdomen: soft, nontender, nondistended. No hepatosplenomegaly. No bruits or masses.  Extremities: no cyanosis, clubbing, rash, edema Neuro: alert & oriented x3, cranial nerves grossly intact. Moves all 4 extremities w/o difficulty. Affect pleasant.   ECG: not done   ASSESSMENT & PLAN:  1: NICM with preserved ejection fraction- - suspect due to atrial fibrillation - NYHA class II - euvolemic today - weighing daily; reminded to call for an overnight weight gain of > 2 pounds or a weekly weight gain of > 5 pounds.  - weight down 4 pounds from her last visit here 2.5 months ago - Echo 02/25/15: EF 60-65% with mild MR, mild LAE - Echo 10/20/20: EF 60-65% with mild LAE, mild MR - Echo 01/23/21: EF 45-50% with mild LVH, severe hypokinesis of left ventricle and trivial MR.  - Echo 03/31/21: EF 60-65% without structural changes.  - Echo 04/19/23: EF 60-65% with mild LVH, Grade II DD, mild LAE, mild MR (reviewed results w/ patient) - RHC/LHC done 01/25/21; Mild to moderate, non-obstructive coronary artery disease, including 30% mid LAD disease, 50-60% mid/distal LCx stenosis, and 60% proximal/mid RCA lesion. Mildly to moderately reduced left ventricular systolic function with mid anterior hypo/akinesis; query atypical Takotsubo variant.  LVEF ~45%. Mildly elevated left heart  filling pressures. Moderately elevated right heart filling and pulmonary artery pressures with significant pulmonary vascular resistance. Mildly reduced Fick cardiac output/index. - is trying to walk between 3000-5000 steps daily; gets 3000 steps daily consistently - not adding salt and has been reading food labels - saw cardiology Elliot Gurney) 02/24 - continue entresto 49/51 BID - continue jardiance 25mg  daily (DM dose) - continue carvedilol 6.25mg  BID - continue furosemide 40mg  BID - BP may not tolerate spironolactone - wearing compression socks daily and removes HS - BNP 04/11/22 was 347.5  2: HTN- - BP 117/40 - saw PCP Carlynn Purl) 08/24 - BMP 02/18/23 reviewed and showed sodium 142, potassium 4.3, creatinine 0.96  & GFR 60  3: DM- - A1c 01/01/23 was 6.5% - saw endocrinology Gershon Crane) 07/24  4: Paroxysmal atrial fibrillation- - unsuccessfully cardioverted twice 10/23 - ablation done 03/24 - saw EP Lalla Brothers) 06/24 - continue eliquis 5mg  BID   Return in 6 months, sooner if needed.

## 2023-04-26 ENCOUNTER — Ambulatory Visit: Payer: Medicare Other | Attending: Family | Admitting: Family

## 2023-04-26 ENCOUNTER — Encounter: Payer: Self-pay | Admitting: Family

## 2023-04-26 VITALS — BP 117/40 | HR 65 | Wt 159.0 lb

## 2023-04-26 DIAGNOSIS — I428 Other cardiomyopathies: Secondary | ICD-10-CM | POA: Diagnosis not present

## 2023-04-26 DIAGNOSIS — I480 Paroxysmal atrial fibrillation: Secondary | ICD-10-CM | POA: Insufficient documentation

## 2023-04-26 DIAGNOSIS — Z79899 Other long term (current) drug therapy: Secondary | ICD-10-CM | POA: Diagnosis not present

## 2023-04-26 DIAGNOSIS — Z7985 Long-term (current) use of injectable non-insulin antidiabetic drugs: Secondary | ICD-10-CM | POA: Insufficient documentation

## 2023-04-26 DIAGNOSIS — E1169 Type 2 diabetes mellitus with other specified complication: Secondary | ICD-10-CM

## 2023-04-26 DIAGNOSIS — Z7901 Long term (current) use of anticoagulants: Secondary | ICD-10-CM | POA: Insufficient documentation

## 2023-04-26 DIAGNOSIS — I4892 Unspecified atrial flutter: Secondary | ICD-10-CM | POA: Diagnosis not present

## 2023-04-26 DIAGNOSIS — Z794 Long term (current) use of insulin: Secondary | ICD-10-CM | POA: Diagnosis not present

## 2023-04-26 DIAGNOSIS — Z7984 Long term (current) use of oral hypoglycemic drugs: Secondary | ICD-10-CM | POA: Insufficient documentation

## 2023-04-26 DIAGNOSIS — I110 Hypertensive heart disease with heart failure: Secondary | ICD-10-CM | POA: Insufficient documentation

## 2023-04-26 DIAGNOSIS — E119 Type 2 diabetes mellitus without complications: Secondary | ICD-10-CM | POA: Diagnosis not present

## 2023-04-26 DIAGNOSIS — I10 Essential (primary) hypertension: Secondary | ICD-10-CM | POA: Diagnosis not present

## 2023-04-26 DIAGNOSIS — I272 Pulmonary hypertension, unspecified: Secondary | ICD-10-CM | POA: Insufficient documentation

## 2023-04-26 DIAGNOSIS — I5042 Chronic combined systolic (congestive) and diastolic (congestive) heart failure: Secondary | ICD-10-CM | POA: Insufficient documentation

## 2023-04-26 DIAGNOSIS — I701 Atherosclerosis of renal artery: Secondary | ICD-10-CM | POA: Insufficient documentation

## 2023-04-26 DIAGNOSIS — I2510 Atherosclerotic heart disease of native coronary artery without angina pectoris: Secondary | ICD-10-CM | POA: Diagnosis not present

## 2023-04-26 DIAGNOSIS — E785 Hyperlipidemia, unspecified: Secondary | ICD-10-CM | POA: Diagnosis not present

## 2023-04-26 DIAGNOSIS — I5032 Chronic diastolic (congestive) heart failure: Secondary | ICD-10-CM

## 2023-04-26 NOTE — Patient Instructions (Signed)
It was good to see you today!

## 2023-04-29 DIAGNOSIS — M5033 Other cervical disc degeneration, cervicothoracic region: Secondary | ICD-10-CM | POA: Diagnosis not present

## 2023-04-29 DIAGNOSIS — M9901 Segmental and somatic dysfunction of cervical region: Secondary | ICD-10-CM | POA: Diagnosis not present

## 2023-04-29 DIAGNOSIS — M9903 Segmental and somatic dysfunction of lumbar region: Secondary | ICD-10-CM | POA: Diagnosis not present

## 2023-04-29 DIAGNOSIS — M5416 Radiculopathy, lumbar region: Secondary | ICD-10-CM | POA: Diagnosis not present

## 2023-05-02 ENCOUNTER — Ambulatory Visit: Payer: Medicare Other

## 2023-05-02 VITALS — BP 100/72 | Ht 63.0 in | Wt 154.0 lb

## 2023-05-02 DIAGNOSIS — Z0000 Encounter for general adult medical examination without abnormal findings: Secondary | ICD-10-CM

## 2023-05-02 NOTE — Progress Notes (Signed)
Subjective:   Bonnie Rowland is a 82 y.o. female who presents for Medicare Annual (Subsequent) preventive examination.  Visit Complete: In person  Cardiac Risk Factors include: advanced age (>80men, >34 women);diabetes mellitus;dyslipidemia;hypertension     Objective:    Today's Vitals   05/02/23 1019 05/02/23 1028  BP: 100/72   Weight: 154 lb (69.9 kg)   Height: 5\' 3"  (1.6 m)   PainSc:  0-No pain   Body mass index is 27.28 kg/m.     05/02/2023   10:37 AM 02/28/2023    8:39 AM 09/25/2022    9:17 AM 04/20/2021    9:30 AM 01/24/2021    1:00 AM 05/13/2020   11:48 AM 04/19/2020    9:29 AM  Advanced Directives  Does Patient Have a Medical Advance Directive? Yes Yes No Yes Yes Yes Yes  Type of Advance Directive Out of facility DNR (pink MOST or yellow form) Healthcare Power of [place];Living will  Healthcare Power of [place];Living will Healthcare Power of [place];Living will Healthcare Power of [place];Living will Healthcare Power of [place];Living will  Does patient want to make changes to medical advance directive? No - Patient declined    No - Patient declined    Copy of Healthcare Power of Attorney in Chart?    Yes - validated most recent copy scanned in chart (See row information) No - copy requested No - copy requested Yes - validated most recent copy scanned in chart (See row information)  Would patient like information on creating a medical advance directive?   No - Patient declined        Current Medications (verified) Outpatient Encounter Medications as of 05/02/2023  Medication Sig   acetaminophen (TYLENOL) 650 MG CR tablet Take 1,300 mg by mouth at bedtime.   ALFALFA PO Take 1 tablet by mouth daily.   amoxicillin (AMOXIL) 500 MG tablet Take 2,000 mg by mouth See admin instructions. Takes before dental procedures   ascorbic Acid (VITAMIN C) 500 MG CPCR Take 500 mg by mouth daily.   B Complex-C (B-COMPLEX WITH VITAMIN C) tablet Take 1 tablet by mouth daily.    BD PEN NEEDLE NANO U/F 32G X 4 MM MISC USE 4 TIMES DAILY (HUMALOG 3 TIMES DAILY AND LANTUS ONCE DAILY) OFFICE NOTIFIED 08/06/17   BLACK ELDERBERRY,BERRY-FLOWER, PO Take 15 mLs by mouth daily. Liquid   Calcium Carbonate (CALCIUM 600 PO) Take 600 mg by mouth daily.   carvedilol (COREG) 6.25 MG tablet Take 1 tablet (6.25 mg total) by mouth 2 (two) times daily with a meal.   cholecalciferol (VITAMIN D) 1000 UNITS tablet Take 1,000 Units by mouth daily. Shaklee   Coenzyme Q10 (COQ10) 200 MG CAPS Take 200 mg by mouth daily.   Continuous Blood Gluc Sensor (FREESTYLE LIBRE SENSOR SYSTEM) MISC by Does not apply route.   Elastic Bandages & Supports (MEDICAL COMPRESSION STOCKINGS) MISC 1 each by Does not apply route daily.   ELIQUIS 5 MG TABS tablet TAKE 1 TABLET BY MOUTH TWICE A DAY   empagliflozin (JARDIANCE) 25 MG TABS tablet Take 25 mg by mouth daily.   ENTRESTO 49-51 MG TAKE 1 TABLET BY MOUTH TWICE A DAY   estradiol (ESTRACE) 0.1 MG/GM vaginal cream APPLY 1/2 GM ONCE WEEKLY USING APPLICATOR, APPLY BLUEBERRY SIZED AMOUNT OF CREAM USING TIP OF FINGER TO URETHRA TWICE WEEKLY   furosemide (LASIX) 40 MG tablet TAKE 1 TABLET BY MOUTH TWICE A DAY   HUMALOG KWIKPEN 100 UNIT/ML KiwkPen Inject 2-6 Units into the skin  3 (three) times daily as needed (High blood sugar).   insulin glargine (LANTUS) 100 unit/mL SOPN Inject 12 Units into the skin at bedtime.   Krill Oil 1000 MG CAPS Take 1,000 mg by mouth daily.   Mag Aspart-Potassium Aspart (POTASSIUM & MAGNESIUM ASPARTAT PO) Take 1 tablet by mouth daily.   Multiple Vitamins-Minerals (PRESERVISION AREDS 2) CHEW Chew 1 each by mouth 2 (two) times daily.   nystatin cream (MYCOSTATIN) Apply 1 Application topically 2 (two) times daily.   OVER THE COUNTER MEDICATION Place 1 application  into both eyes See admin instructions. Blephadex eyelid foam, apply to eyelids once daily   OVER THE COUNTER MEDICATION Take 1 tablet by mouth daily. Vita-Lea Gold otc supplement With  out vitamin K (Shaklee products)   OVER THE COUNTER MEDICATION Take 1 tablet by mouth daily. Nutriferon otc supplement   Probiotic Product (PROBIOTIC PO) Take 1 capsule by mouth at bedtime.   PROLIA 60 MG/ML SOSY injection Inject 60 mg into the skin every 6 (six) months.   Propylene Glycol (SYSTANE BALANCE OP) Place 1 drop into both eyes daily as needed (dry eyes).   rosuvastatin (CRESTOR) 10 MG tablet Take 1 tablet (10 mg total) by mouth daily.   Sodium Chloride-Sodium Bicarb (NETI POT SINUS WASH NA) Place 1 Dose into the nose at bedtime.   tirzepatide (MOUNJARO) 10 MG/0.5ML Pen Inject 10 mg into the skin once a week.   tiZANidine (ZANAFLEX) 2 MG tablet Take 1-2 tablets (2-4 mg total) by mouth at bedtime.   Vitamin D, Ergocalciferol, (DRISDOL) 1.25 MG (50000 UT) CAPS capsule Take 50,000 Units by mouth every Saturday.   [DISCONTINUED] doxycycline (VIBRA-TABS) 100 MG tablet Take 1 tablet (100 mg total) by mouth 2 (two) times daily.   No facility-administered encounter medications on file as of 05/02/2023.    Allergies (verified) Flexeril [cyclobenzaprine], Cheese, Ciprofloxacin hcl, Coconut (cocos nucifera), and Keflex [cephalexin]   History: 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; left shoulder blade   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   Cataract 2003   Cellulitis and abscess of leg, except foot    Conjunctivitis unspecified    Dermatophytosis of nail    Diabetes mellitus    type II   Elevated troponin    Esophageal  reflux    Hyperlipidemia    Hypertension    Osteoporosis 2016   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)    Renal artery stenosis (HCC)    Sprain of lumbar region    Thoracic or lumbosacral neuritis or radiculitis, unspecified    Urinary tract infection, site not specified    Past Surgical History:  Procedure Laterality Date   APPENDECTOMY     ATRIAL FIBRILLATION ABLATION N/A 09/25/2022   Procedure: ATRIAL FIBRILLATION ABLATION;  Surgeon: Lanier Prude, MD;  Location: [facility] INVASIVE CV LAB;  Service: Cardiovascular;  Laterality: N/A;   BIOPSY  02/28/2023   Procedure: BIOPSY;  Surgeon: Jaynie Collins, DO;  Location: [facility] ENDOSCOPY;  Service: Gastroenterology;;  CARDIAC CATHETERIZATION     CARDIOVERSION N/A 11/30/2020   Procedure: CARDIOVERSION;  Surgeon: Antonieta Iba, MD;  Location: [facility] ORS;  Service: Cardiovascular;  Laterality: N/A;   CARDIOVERSION N/A 01/20/2021   Procedure: CARDIOVERSION;  Surgeon: Antonieta Iba, MD;  Location: [facility] ORS;  Service: Cardiovascular;  Laterality: N/A;   CARDIOVERSION N/A 01/30/2021   Procedure: CARDIOVERSION;  Surgeon: Iran Ouch, MD;  Location: [facility] ORS;  Service: Cardiovascular;  Laterality: N/A;   cataract surgery     COLONOSCOPY     COLONOSCOPY N/A 02/28/2023   Procedure: COLONOSCOPY;  Surgeon: Jaynie Collins, DO;  Location: [facility] ENDOSCOPY;  Service: Gastroenterology;  Laterality: N/A;   COLONOSCOPY WITH PROPOFOL N/A 05/09/2016   Procedure: COLONOSCOPY WITH PROPOFOL;  Surgeon: Earline Mayotte, MD;  Location: [facility] ENDOSCOPY;  Service: Endoscopy;  Laterality: N/A;   DIAGNOSTIC MAMMOGRAM     EYE SURGERY Bilateral    Cataract Extraction   JOINT REPLACEMENT  2016   KNEE ARTHROPLASTY Right 03/30/2015   Procedure: COMPUTER ASSISTED TOTAL KNEE ARTHROPLASTY;  Surgeon: Donato Heinz, MD;  Location: [facility] ORS;  Service: Orthopedics;  Laterality: Right;   KNEE ARTHROSCOPY Right    KNEE CLOSED REDUCTION Right 05/23/2015   Procedure: CLOSED  MANIPULATION KNEE;  Surgeon: Donato Heinz, MD;  Location: [facility] ORS;  Service: Orthopedics;  Laterality: Right;   POLYPECTOMY  02/28/2023   Procedure: POLYPECTOMY;  Surgeon: Jaynie Collins, DO;  Location[facility] ENDOSCOPY;  Service: Gastroenterology;;   RIGHT/LEFT HEART CATH AND CORONARY ANGIOGRAPHY N/A 01/25/2021   Procedure: RIGHT/LEFT HEART CATH AND CORONARY ANGIOGRAPHY;  Surgeon: Yvonne Kendall, MD;  Location: [facility] INVASIVE CV LAB;  Service: Cardiovascular;  Laterality: N/A;   TONSILLECTOMY     Family History  Problem Relation Age of Onset   Diabetes Mother    Hypertension Mother    Cancer Mother    Kidney disease Mother    Hypertension Father    Cancer Father        Prostate   Breast cancer Maternal Aunt 18   Colon cancer Son    Kidney cancer Neg Hx    Bladder Cancer Neg Hx    Social History   Socioeconomic History   Marital status: Married    Spouse name: Camera operator   Number of children: 1   Years of education: Not on file   Highest education level: Master's degree (e.g., MA, MS, MEng, MEd, MSW, MBA)  Occupational History   Occupation: Retired  Tobacco Use   Smoking status: Never   Smokeless tobacco: Never   Tobacco comments:    Never smoke 10/23/22  Vaping Use   Vaping status: Never Used  Substance and Sexual Activity   Alcohol use: Not Currently    Alcohol/week: 1.0 standard drink of alcohol    Types: 1 Glasses of wine per week    Comment: RARELY   Drug use: No   Sexual activity: Yes    Partners: Male    Birth control/protection: Post-menopausal  Other Topics Concern   Not on file  Social History Narrative   She is married , only has one son and he was diagnosed with colon cancer at age 75.    Social Determinants of Health   Financial Resource Strain: Low Risk  (05/02/2023)   Overall Financial Resource Strain (CARDIA)    Difficulty of Paying Living Expenses: Not hard at all  Food Insecurity: No Food Insecurity (05/02/2023)   Hunger Vital Sign     Worried About Running Out  of Food in the Last Year: Never true    Ran Out of Food in the Last Year: Never true  Transportation Needs: No Transportation Needs (05/02/2023)   PRAPARE - Administrator, Civil Service (Medical): No    Lack of Transportation (Non-Medical): No  Physical Activity: Sufficiently Active (05/02/2023)   Exercise Vital Sign    Days of Exercise per Week: 7 days    Minutes of Exercise per Session: 30 min  Stress: No Stress Concern Present (05/02/2023)   [employer] of Occupational Health - Occupational Stress Questionnaire    Feeling of Stress : Only a little  Social Connections: Socially Integrated (05/02/2023)   Social Connection and Isolation Panel [NHANES]    Frequency of Communication with Friends and Family: More than three times a week    Frequency of Social Gatherings with Friends and Family: More than three times a week    Attends Religious Services: More than 4 times per year    Active Member of [employer] or Organizations: Yes    Attends Engineer, structural: More than 4 times per year    Marital Status: Married    Tobacco Counseling Counseling given: Not Answered Tobacco comments: Never smoke 10/23/22   Clinical Intake:  Pre-visit preparation completed: Yes  Pain : No/denies pain Pain Score: 0-No pain     Nutritional Status: BMI 25 -29 Overweight Nutritional Risks: None Diabetes: Yes CBG done?: No Did pt. bring in CBG monitor from home?: No  How often do you need to have someone help you when you read instructions, pamphlets, or other written materials from your doctor or pharmacy?: 1 - Never  Interpreter Needed?: No  Information entered by :: Kennedy Bucker, LPN   Activities of Daily Living    05/02/2023   10:39 AM 02/14/2023    9:03 AM  In your present state of health, do you have any difficulty performing the following activities:  Hearing? 1 1  Vision? 0 1  Difficulty concentrating or making decisions? 0 0   Walking or climbing stairs? 0 1  Dressing or bathing? 0 0  Doing errands, shopping? 0 0  Preparing Food and eating ? N   Using the Toilet? N   In the past six months, have you accidently leaked urine? N   Do you have problems with loss of bowel control? N   Managing your Medications? N   Managing your Finances? N   Housekeeping or managing your Housekeeping? N     Patient Care Team: Alba Cory, MD as PCP - General (Family Medicine) Antonieta Iba, MD as PCP - Cardiology (Cardiology) Lanier Prude, MD as PCP - Electrophysiology (Cardiology) Antonieta Iba, MD as Consulting Physician (Cardiology) Dasher, Cliffton Asters, MD as Consulting Physician (Dermatology) Sallee Lange, MD as Consulting Physician (Ophthalmology) Sherlon Handing, MD as Consulting Physician (Internal Medicine) Linus Salmons, MD as Consulting Physician (Otolaryngology) Josefina Do, DC as Referring Physician (Chiropractic Medicine) Gaspar Cola, [facility] (Inactive) as Pharmacist (Pharmacist) Jaynie Collins, DO as Consulting Physician (Gastroenterology)  Indicate any recent Medical Services you may have received from other than [facility] providers in the past year (date may be approximate).     Assessment:   This is a routine wellness examination for Bonnie Rowland.  Hearing/Vision screen Hearing Screening - Comments:: Wears aid- left ear Vision Screening - Comments:: Wears glasses- Dr.Dingeldein   Goals Addressed             This Visit's Progress  DIET - EAT MORE FRUITS AND VEGETABLES         Depression Screen    05/02/2023   10:34 AM 02/14/2023    9:03 AM 08/16/2022    8:33 AM 04/26/2022    1:59 PM 02/12/2022    9:11 AM 01/09/2022    8:02 AM 08/29/2021    9:46 AM  PHQ 2/9 Scores  PHQ - 2 Score 0 0 0 0 0 0 0  PHQ- 9 Score 0 0 0  0 0     Fall Risk    05/02/2023   10:38 AM 02/14/2023    9:03 AM 08/16/2022    8:33 AM 04/26/2022    1:59 PM 02/12/2022    9:11 AM  Fall Risk    Falls in the past year? 0 0 0 0 0  Number falls in past yr: 0 0 0 0 0  Injury with Fall? 0 0 0 0 0  Risk for fall due to : No Fall Risks No Fall Risks No Fall Risks No Fall Risks No Fall Risks  Follow up Falls prevention discussed;Falls evaluation completed Falls prevention discussed Falls prevention discussed Falls evaluation completed Falls prevention discussed    MEDICARE RISK AT HOME: Medicare Risk at Home Any stairs in or around the home?: Yes If so, are there any without handrails?: No Home free of loose throw rugs in walkways, pet beds, electrical cords, etc?: Yes Adequate lighting in your home to reduce risk of falls?: Yes Life alert?: Yes Use of a cane, walker or w/c?: Yes (cane when out and about) Grab bars in the bathroom?: Yes Shower chair or bench in shower?: Yes Elevated toilet seat or a handicapped toilet?: Yes  TIMED UP AND GO:  Was the test performed?  Yes  Length of time to ambulate 10 feet: 6 sec Gait slow and steady with assistive device    Cognitive Function:        05/02/2023   10:41 AM 04/26/2022    2:01 PM 04/11/2018    9:38 AM 04/08/2017    2:57 PM  6CIT Screen  What Year? 0 points 0 points 0 points 0 points  What month? 0 points 0 points 0 points 0 points  What time? 0 points 0 points 0 points 0 points  Count back from 20 0 points 0 points 0 points 0 points  Months in reverse 0 points 0 points 0 points 0 points  Repeat phrase 0 points 0 points 0 points 0 points  Total Score 0 points 0 points 0 points 0 points    Immunizations Immunization History  Administered Date(s) Administered   Fluad Quad(high Dose 65+) 03/19/2019, 04/20/2020   Influenza, High Dose Seasonal PF 03/10/2015, 03/02/2016, 03/12/2017, 04/07/2018, 04/13/2021, 04/17/2022   Influenza-Unspecified 04/25/2023   Moderna Covid-19 Vaccine Bivalent Booster 56yrs & up 05/17/2020, 11/15/2020, 03/17/2021   Moderna Sars-Covid-2 Vaccination 07/14/2019, 08/11/2019, 05/17/2020, 11/17/2020,  03/17/2021   Pfizer Covid-19 Vaccine Bivalent Booster 91yrs & up 11/28/2021, 05/11/2022   Pneumococcal Conjugate-13 07/13/2013   Pneumococcal Polysaccharide-23 03/31/2010   Respiratory Syncytial Virus Vaccine,Recomb Aduvanted(Arexvy) 05/05/2022   Rsv, Bivalent, Protein Subunit Rsvpref,pf Verdis Frederickson) 05/05/2022   Tdap 03/07/2012, 01/19/2022   Zoster Recombinant(Shingrix) 02/02/2017, 04/05/2017   Zoster, Live 06/01/2010    TDAP status: Up to date  Flu Vaccine status: Up to date  Pneumococcal vaccine status: Up to date  Covid-19 vaccine status: Completed vaccines  Qualifies for Shingles Vaccine? Yes   Zostavax completed Yes   Shingrix Completed?: Yes  Screening Tests  Health Maintenance  Topic Date Due   COVID-19 Vaccine (10 - 2023-24 season) 03/03/2023   HEMOGLOBIN A1C  07/04/2023   FOOT EXAM  07/20/2023   DEXA SCAN  09/22/2023   MAMMOGRAM  12/05/2023   OPHTHALMOLOGY EXAM  01/15/2024   Diabetic kidney evaluation - Urine ACR  02/14/2024   Diabetic kidney evaluation - eGFR measurement  02/18/2024   Medicare Annual Wellness (AWV)  05/01/2024   DTaP/Tdap/Td (3 - Td or Tdap) 01/20/2032   Pneumonia Vaccine 57+ Years old  Completed   INFLUENZA VACCINE  Completed   Zoster Vaccines- Shingrix  Completed   HPV VACCINES  Aged Out   Hepatitis C Screening  Discontinued    Health Maintenance  Health Maintenance Due  Topic Date Due   COVID-19 Vaccine (10 - 2023-24 season) 03/03/2023    Colorectal cancer screening: No longer required. - had colonoscopy on 02/27/23  Mammogram status: No longer required due to age.- had mammogram 12/05/22, wants to continue to have them  Bone Density status: Completed 09/21/21. Results reflect: Bone density results: OSTEOPENIA. Repeat every 2 years.- originally had osteoporosis, is on Prolia  Lung Cancer Screening: (Low Dose CT Chest recommended if Age 23-80 years, 20 pack-year currently smoking OR have quit w/in 15years.) does not qualify.   Additional  Screening:  Hepatitis C Screening: does not qualify; Completed 01/07/20  Vision Screening: Recommended annual ophthalmology exams for early detection of glaucoma and other disorders of the eye. Is the patient up to date with their annual eye exam?  Yes  Who is the provider or what is the name of the office in which the patient attends annual eye exams? Dr.Dingeldein If pt is not established with a provider, would they like to be referred to a provider to establish care? No .   Dental Screening: Recommended annual dental exams for proper oral hygiene  Diabetic Foot Exam: Diabetic Foot Exam: Completed 07/19/22  Community Resource Referral / Chronic Care Management: CRR required this visit?  No   CCM required this visit?  No     Plan:     I have personally reviewed and noted the following in the patient's chart:   Medical and social history Use of alcohol, tobacco or illicit drugs  Current medications and supplements including opioid prescriptions. Patient is not currently taking opioid prescriptions. Functional ability and status Nutritional status Physical activity Advanced directives List of other physicians Hospitalizations, surgeries, and ER visits in previous 12 months Vitals Screenings to include cognitive, depression, and falls Referrals and appointments  In addition, I have reviewed and discussed with patient certain preventive protocols, quality metrics, and best practice recommendations. A written personalized care plan for preventive services as well as general preventive health recommendations were provided to patient.     Hal Hope, LPN   [id number]   After Visit Summary: printed Nurse Notes: none

## 2023-05-02 NOTE — Patient Instructions (Addendum)
Bonnie Rowland , Thank you for taking time to come for your Medicare Wellness Visit. I appreciate your ongoing commitment to your health goals. Please review the following plan we discussed and let me know if I can assist you in the future.   Referrals/Orders/Follow-Ups/Clinician Recommendations: none  This is a list of the screening recommended for you and due dates:  Health Maintenance  Topic Date Due   COVID-19 Vaccine (10 - 2023-24 season) 03/03/2023   Hemoglobin A1C  07/04/2023   Complete foot exam   07/20/2023   DEXA scan (bone density measurement)  09/22/2023   Mammogram  12/05/2023   Eye exam for diabetics  01/15/2024   Yearly kidney health urinalysis for diabetes  02/14/2024   Yearly kidney function blood test for diabetes  02/18/2024   Medicare Annual Wellness Visit  05/01/2024   DTaP/Tdap/Td vaccine (3 - Td or Tdap) 01/20/2032   Pneumonia Vaccine  Completed   Flu Shot  Completed   Zoster (Shingles) Vaccine  Completed   HPV Vaccine  Aged Out   Hepatitis C Screening  Discontinued    Advanced directives: (In Chart) A copy of your advanced directives are scanned into your chart should your provider ever need it.  Next Medicare Annual Wellness Visit scheduled for next year: Yes   05/07/24 @ 10:55 a.m. in person

## 2023-05-09 DIAGNOSIS — M810 Age-related osteoporosis without current pathological fracture: Secondary | ICD-10-CM | POA: Diagnosis not present

## 2023-05-09 DIAGNOSIS — E1159 Type 2 diabetes mellitus with other circulatory complications: Secondary | ICD-10-CM | POA: Diagnosis not present

## 2023-05-09 DIAGNOSIS — I152 Hypertension secondary to endocrine disorders: Secondary | ICD-10-CM | POA: Diagnosis not present

## 2023-05-09 DIAGNOSIS — E1165 Type 2 diabetes mellitus with hyperglycemia: Secondary | ICD-10-CM | POA: Diagnosis not present

## 2023-05-09 DIAGNOSIS — Z794 Long term (current) use of insulin: Secondary | ICD-10-CM | POA: Diagnosis not present

## 2023-05-09 DIAGNOSIS — E1169 Type 2 diabetes mellitus with other specified complication: Secondary | ICD-10-CM | POA: Diagnosis not present

## 2023-05-09 DIAGNOSIS — E785 Hyperlipidemia, unspecified: Secondary | ICD-10-CM | POA: Diagnosis not present

## 2023-05-13 DIAGNOSIS — M5033 Other cervical disc degeneration, cervicothoracic region: Secondary | ICD-10-CM | POA: Diagnosis not present

## 2023-05-13 DIAGNOSIS — M9903 Segmental and somatic dysfunction of lumbar region: Secondary | ICD-10-CM | POA: Diagnosis not present

## 2023-05-13 DIAGNOSIS — M5416 Radiculopathy, lumbar region: Secondary | ICD-10-CM | POA: Diagnosis not present

## 2023-05-13 DIAGNOSIS — M9901 Segmental and somatic dysfunction of cervical region: Secondary | ICD-10-CM | POA: Diagnosis not present

## 2023-05-20 ENCOUNTER — Ambulatory Visit (INDEPENDENT_AMBULATORY_CARE_PROVIDER_SITE_OTHER): Payer: Medicare Other | Admitting: Podiatry

## 2023-05-20 ENCOUNTER — Encounter: Payer: Self-pay | Admitting: Podiatry

## 2023-05-20 DIAGNOSIS — Q828 Other specified congenital malformations of skin: Secondary | ICD-10-CM

## 2023-05-20 DIAGNOSIS — E119 Type 2 diabetes mellitus without complications: Secondary | ICD-10-CM

## 2023-05-21 ENCOUNTER — Encounter: Payer: Self-pay | Admitting: Physician Assistant

## 2023-05-21 ENCOUNTER — Ambulatory Visit: Payer: Medicare Other | Attending: Physician Assistant | Admitting: Physician Assistant

## 2023-05-21 VITALS — BP 108/48 | HR 63 | Ht 63.0 in | Wt 152.2 lb

## 2023-05-21 DIAGNOSIS — I428 Other cardiomyopathies: Secondary | ICD-10-CM | POA: Insufficient documentation

## 2023-05-21 DIAGNOSIS — I5032 Chronic diastolic (congestive) heart failure: Secondary | ICD-10-CM | POA: Insufficient documentation

## 2023-05-21 DIAGNOSIS — I4819 Other persistent atrial fibrillation: Secondary | ICD-10-CM | POA: Insufficient documentation

## 2023-05-21 DIAGNOSIS — I10 Essential (primary) hypertension: Secondary | ICD-10-CM | POA: Insufficient documentation

## 2023-05-21 DIAGNOSIS — I272 Pulmonary hypertension, unspecified: Secondary | ICD-10-CM | POA: Diagnosis not present

## 2023-05-21 DIAGNOSIS — I2510 Atherosclerotic heart disease of native coronary artery without angina pectoris: Secondary | ICD-10-CM | POA: Insufficient documentation

## 2023-05-21 DIAGNOSIS — E785 Hyperlipidemia, unspecified: Secondary | ICD-10-CM | POA: Insufficient documentation

## 2023-05-21 NOTE — Progress Notes (Signed)
Cardiology Office Note    Date:  05/21/2023   ID:  Avonda, [name] [birth year], MRN [id number]  PCP:  Alba Cory, MD  Cardiologist:  Julien Nordmann, MD  Electrophysiologist:  Lanier Prude, MD   Chief Complaint: Follow-up  History of Present Illness:   Bonnie Rowland is a 82 y.o. female with history of nonobstructive CAD by LHC in 12/2020, chronic combined systolic and diastolic CHF, NICM with subsequent normalization of LV systolic function by echo in 03/2021, persistent A. Fib status post DCCV on 11/30/2020 with recurrent Afib s/p repeat DCCV on amiodarone 01/20/2021 s/p repeat DCCV on 01/30/2021 and atrial flutter s/p ablation in 08/2022, pulmonary hypertension, less than 50% left renal artery stenosis by imaging in 2021, 1 to 39% bilateral ICA carotid stenosis by imaging in 2017, DM2, HTN, HLD, and obesity who  presents for follow-up of her CAD and cardiomyopathy.  She underwent LHC in 09/2005 which showed normal coronary arteries.  She was diagnosed with atrial flutter with RVR in 01/2015 after presenting for a total right knee replacement with spontaneous conversion to sinus rhythm.  She had been maintained on flecainide briefly.  She was seen in 08/2020 and noted to be in asymptomatic A-fib leading to the addition of apixaban at that time.  Echo in 09/2020 demonstrated an EF of 60 to 65%, no regional wall motion abnormalities, indeterminate LV diastolic function parameters, normal RV systolic function and ventricular cavity size, normal PASP, mildly dilated left atrium, and mild mitral regurgitation.  She subsequently underwent DCCV on 11/30/2020 with conversion to sinus rhythm.  When she was seen in follow up on 12/22/2020, she was noted to be back in Afib with controlled ventricular response.  In this setting, she was loaded with amiodarone and subsequently underwent repeat DCCV on 01/20/2021.  She was admitted in 12/2020 with respiratory failure pulmonary edema in the setting of new LV  dysfunction and recurrent A-fib.  Echo showed an EF of 45 to 50%, mild LVH, severe hypokinesis of the entire anterior and anteroseptal wall, moderately reduced RV systolic function with normal RV cavity size and mildly increased RV wall thickness, degenerative mitral valve with trivial regurgitation.  R/LHC showed mild to moderate, nonobstructive CAD including 30% mid LAD stenosis, 50 to 60% mid to distal LCx stenosis, and 60% proximal to mid RCA stenosis.  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  Moderately elevated right heart filling and pulmonary artery pressures with significant pulmonary vascular resistance and mildly reduced cardiac output and index.  She was diuresed, placed on tolerated GDMT, and underwent successful repeat DCCV.  Limited echo in 03/2021 showed an EF of 60 to 65%, no regional wall motion normalities, normal RV systolic function and ventricular cavity size, and no significant valvular abnormality.  She had recurrent A-fib in the fall 2023 with unsuccessful DCCV in the ED x 2.  She was subsequently evaluated by EP and underwent successful A-fib/flutter ablation on 09/25/2022.  More recently, she has been followed by the [facility] CHF clinic.  Echo on 04/19/2023 showed an EF of 60 to 65%, no regional wall motion abnormalities, mild LVH, grade 2 diastolic dysfunction, normal RV systolic function and ventricular cavity size, mildly dilated left atrium, degenerative mitral valve with mild regurgitation, and aortic valve sclerosis without evidence of stenosis.  She comes in doing very well from a cardiac perspective and is without symptoms  of angina or cardiac decompensation.  She remains very active at baseline typically walking 3000-5000 steps per day and participates in tai chi several days per week without cardiac limitation.  No falls or symptoms  concerning for bleeding.  Adherent and tolerating cardiac medications without issues.  No lower extremity swelling or progressive orthopnea.  No dizziness, recent to be, or syncope.  She does not have any acute cardiac concerns at this time.   Labs independently reviewed: 05/2023 - A1c 6.7 10/2022 - potassium 3.6, BUN 25, serum creatinine 0.92 08/2022 - Hgb 12.9, PLT 165 08/2022 - albumin 4.2, AST/ALT normal, TC 144, TG 73, HDL 79, LDL 50 04/2022 - TSH normal  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; left shoulder blade   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   Cataract 2003   Cellulitis and abscess of leg, except foot    Conjunctivitis unspecified    Dermatophytosis of nail    Diabetes mellitus    type II   Elevated troponin    Esophageal reflux    Hyperlipidemia    Hypertension    Osteoporosis 2016   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)    Renal artery stenosis (HCC)    Sprain of lumbar region    Thoracic or lumbosacral neuritis or radiculitis, unspecified    Urinary tract infection, site not specified     Past Surgical History:  Procedure Laterality Date   APPENDECTOMY     ATRIAL FIBRILLATION ABLATION N/A 09/25/2022   Procedure: ATRIAL FIBRILLATION ABLATION;  Surgeon: Lanier Prude, MD;  Location: [facility];  Service: Cardiovascular;  Laterality: N/A;   BIOPSY  02/28/2023   Procedure: BIOPSY;  Surgeon: Jaynie Collins, DO;  Location: [facility] ENDOSCOPY;  Service: Gastroenterology;;   CARDIAC CATHETERIZATION      CARDIOVERSION N/A 11/30/2020   Procedure: CARDIOVERSION;  Surgeon: Antonieta Iba, MD;  Location: [facility] ORS;  Service: Cardiovascular;  Laterality: N/A;   CARDIOVERSION N/A 01/20/2021   Procedure: CARDIOVERSION;  Surgeon: Antonieta Iba, MD;  Location: [facility] ORS;  Service: Cardiovascular;  Laterality: N/A;   CARDIOVERSION N/A 01/30/2021   Procedure: CARDIOVERSION;  Surgeon: Iran Ouch, MD;  Location: [facility] ORS;  Service: Cardiovascular;  Laterality: N/A;   cataract surgery     COLONOSCOPY     COLONOSCOPY N/A 02/28/2023   Procedure: COLONOSCOPY;  Surgeon: Jaynie Collins, DO;  Location: [facility] ENDOSCOPY;  Service: Gastroenterology;  Laterality: N/A;   COLONOSCOPY WITH PROPOFOL N/A 05/09/2016   Procedure: COLONOSCOPY WITH PROPOFOL;  Surgeon: Earline Mayotte, MD;  Location: [facility] ENDOSCOPY;  Service: Endoscopy;  Laterality: N/A;   DIAGNOSTIC MAMMOGRAM     EYE SURGERY Bilateral    Cataract Extraction   JOINT REPLACEMENT  2016   KNEE ARTHROPLASTY Right 03/30/2015   Procedure: COMPUTER ASSISTED TOTAL KNEE ARTHROPLASTY;  Surgeon: Donato Heinz, MD;  Location: [facility] ORS;  Service: Orthopedics;  Laterality: Right;   KNEE ARTHROSCOPY Right    KNEE CLOSED REDUCTION Right 05/23/2015  Procedure: CLOSED MANIPULATION KNEE;  Surgeon: Donato Heinz, MD;  Location: [facility] ORS;  Service: Orthopedics;  Laterality: Right;   POLYPECTOMY  02/28/2023   Procedure: POLYPECTOMY;  Surgeon: Jaynie Collins, DO;  Location: [facility] ENDOSCOPY;  Service: Gastroenterology;;   RIGHT/LEFT HEART CATH AND CORONARY ANGIOGRAPHY N/A 01/25/2021   Procedure: RIGHT/LEFT HEART CATH AND CORONARY ANGIOGRAPHY;  Surgeon: Yvonne Kendall, MD;  Location: [facility] INVASIVE CV LAB;  Service: Cardiovascular;  Laterality: N/A;   TONSILLECTOMY      Current Medications: Current Meds  Medication Sig   acetaminophen (TYLENOL) 650 MG CR tablet Take 1,300 mg by mouth at bedtime.   ALFALFA PO Take 1 tablet by mouth daily.   amoxicillin  (AMOXIL) 500 MG tablet Take 2,000 mg by mouth See admin instructions. Takes before dental procedures   ascorbic Acid (VITAMIN C) 500 MG CPCR Take 500 mg by mouth daily.   B Complex-C (B-COMPLEX WITH VITAMIN C) tablet Take 1 tablet by mouth daily.   BD PEN NEEDLE NANO U/F 32G X 4 MM MISC USE 4 TIMES DAILY (HUMALOG 3 TIMES DAILY AND LANTUS ONCE DAILY) OFFICE NOTIFIED 08/06/17   BLACK ELDERBERRY,BERRY-FLOWER, PO Take 15 mLs by mouth daily. Liquid   Calcium Carbonate (CALCIUM 600 PO) Take 600 mg by mouth daily.   carvedilol (COREG) 6.25 MG tablet Take 1 tablet (6.25 mg total) by mouth 2 (two) times daily with a meal.   cholecalciferol (VITAMIN D) 1000 UNITS tablet Take 1,000 Units by mouth daily. shaklee   Coenzyme Q10 (COQ10) 200 MG CAPS Take 200 mg by mouth daily.   Continuous Blood Gluc Sensor (FREESTYLE LIBRE SENSOR SYSTEM) MISC by Does not apply route.   Elastic Bandages & Supports (MEDICAL COMPRESSION STOCKINGS) MISC 1 each by Does not apply route daily.   ELIQUIS 5 MG TABS tablet TAKE 1 TABLET BY MOUTH TWICE A DAY   empagliflozin (JARDIANCE) 25 MG TABS tablet Take 25 mg by mouth daily.   ENTRESTO 49-51 MG TAKE 1 TABLET BY MOUTH TWICE A DAY   estradiol (ESTRACE) 0.1 MG/GM vaginal cream APPLY 1/2 GM ONCE WEEKLY USING APPLICATOR, APPLY BLUEBERRY SIZED AMOUNT OF CREAM USING TIP OF FINGER TO URETHRA TWICE WEEKLY   furosemide (LASIX) 40 MG tablet TAKE 1 TABLET BY MOUTH TWICE A DAY   HUMALOG KWIKPEN 100 UNIT/ML KiwkPen Inject 2-6 Units into the skin 3 (three) times daily as needed (High blood sugar).   insulin glargine (LANTUS) 100 unit/mL SOPN Inject 10 Units into the skin in the morning.   Krill Oil 1000 MG CAPS Take 1,000 mg by mouth daily.   Mag Aspart-Potassium Aspart (POTASSIUM & MAGNESIUM ASPARTAT PO) Take 1 tablet by mouth daily.   Multiple Vitamins-Minerals (PRESERVISION AREDS 2) CHEW Chew 1 each by mouth 2 (two) times daily.   nystatin cream (MYCOSTATIN) Apply 1 Application topically 2 (two)  times daily.   OVER THE COUNTER MEDICATION Place 1 application  into both eyes See admin instructions. Blephadex eyelid foam, apply to eyelids once daily   OVER THE COUNTER MEDICATION Take 1 tablet by mouth daily. Vita-Lea Gold otc supplement With out vitamin K (Shaklee products)   OVER THE COUNTER MEDICATION Take 1 tablet by mouth daily. Nutriferon otc supplement   Probiotic Product (PROBIOTIC PO) Take 1 capsule by mouth at bedtime.   PROLIA 60 MG/ML SOSY injection Inject 60 mg into the skin every 6 (six) months.   Propylene Glycol (SYSTANE BALANCE OP) Place 1 drop into both eyes daily as needed (dry eyes).  rosuvastatin (CRESTOR) 10 MG tablet Take 1 tablet (10 mg total) by mouth daily.   Sodium Chloride-Sodium Bicarb (NETI POT SINUS WASH NA) Place 1 Dose into the nose at bedtime.   tirzepatide (MOUNJARO) 10 MG/0.5ML Pen Inject 10 mg into the skin once a week.   tiZANidine (ZANAFLEX) 2 MG tablet Take 1-2 tablets (2-4 mg total) by mouth at bedtime.   Vitamin D, Ergocalciferol, (DRISDOL) 1.25 MG (50000 UT) CAPS capsule Take 50,000 Units by mouth every Saturday.    Allergies:   Flexeril [cyclobenzaprine], Cheese, Ciprofloxacin hcl, Coconut (cocos nucifera), and Keflex [cephalexin]   Social History   Socioeconomic History   Marital status: Married    Spouse name: Camera operator   Number of children: 1   Years of education: Not on file   Highest education level: Master's degree (e.g., MA, MS, MEng, MEd, MSW, MBA)  Occupational History   Occupation: Retired  Tobacco Use   Smoking status: Never   Smokeless tobacco: Never   Tobacco comments:    Never smoke 10/23/22  Vaping Use   Vaping status: Never Used  Substance and Sexual Activity   Alcohol use: Not Currently    Alcohol/week: 1.0 standard drink of alcohol    Types: 1 Glasses of wine per week    Comment: RARELY   Drug use: No   Sexual activity: Yes    Partners: Male    Birth control/protection: Post-menopausal  Other Topics Concern    Not on file  Social History Narrative   She is married , only has one son and he was diagnosed with colon cancer at age 48.    Social Determinants of Health   Financial Resource Strain: Low Risk  (05/02/2023)   Overall Financial Resource Strain (CARDIA)    Difficulty of Paying Living Expenses: Not hard at all  Food Insecurity: No Food Insecurity (05/02/2023)   Hunger Vital Sign    Worried About Running Out of Food in the Last Year: Never true    Ran Out of Food in the Last Year: Never true  Transportation Needs: No Transportation Needs (05/02/2023)   PRAPARE - Administrator, Civil Service (Medical): No    Lack of Transportation (Non-Medical): No  Physical Activity: Sufficiently Active (05/02/2023)   Exercise Vital Sign    Days of Exercise per Week: 7 days    Minutes of Exercise per Session: 30 min  Stress: No Stress Concern Present (05/02/2023)   [employer] of Occupational Health - Occupational Stress Questionnaire    Feeling of Stress : Only a little  Social Connections: Socially Integrated (05/02/2023)   Social Connection and Isolation Panel [NHANES]    Frequency of Communication with Friends and Family: More than three times a week    Frequency of Social Gatherings with Friends and Family: More than three times a week    Attends Religious Services: More than 4 times per year    Active Member of [employer] or Organizations: Yes    Attends Engineer, structural: More than 4 times per year    Marital Status: Married     Family History:  The patient's family history includes Breast cancer (age of onset: 38) in her maternal aunt; Cancer in her father and mother; Colon cancer in her son; Diabetes in her mother; Hypertension in her father and mother; Kidney disease in her mother. There is no history of Kidney cancer or Bladder Cancer.  ROS:   12-point review of systems is negative unless otherwise noted in  the HPI.   EKGs/Labs/Other Studies Reviewed:     Studies reviewed were summarized above. The additional studies were reviewed today:  2D echo 04/19/2023: 1. Left ventricular ejection fraction, by estimation, is 60 to 65%. The  left ventricle has normal function. The left ventricle has no regional  wall motion abnormalities. There is mild left ventricular hypertrophy.  Left ventricular diastolic parameters  are consistent with Grade II diastolic dysfunction (pseudonormalization).   2. Right ventricular systolic function is normal. The right ventricular  size is normal.   3. Left atrial size was mildly dilated.   4. The mitral valve is degenerative. Mild mitral valve regurgitation.   5. The aortic valve is tricuspid. Aortic valve regurgitation is not  visualized. Aortic valve sclerosis/calcification is present, without any  evidence of aortic stenosis.   6. The inferior vena cava is normal in size with <50% respiratory  variability, suggesting right atrial pressure of 8 mmHg.  __________  Limited echo 03/31/2021: 1. Left ventricular ejection fraction, by estimation, is 60 to 65%. The  left ventricle has normal function. The left ventricle has no regional  wall motion abnormalities. Left ventricular diastolic parameters are  indeterminate.   2. Right ventricular systolic function is normal. The right ventricular  size is normal. Tricuspid regurgitation signal is inadequate for assessing  PA pressure.   3. The mitral valve is normal in structure. No evidence of mitral valve  regurgitation. No evidence of mitral stenosis. Moderate mitral annular  calcification.   4. The aortic valve is normal in structure. Aortic valve regurgitation is  not visualized. No aortic stenosis is present.   5. The inferior vena cava is normal in size with greater than 50%  respiratory variability, suggesting right atrial pressure of 3 mmHg.  __________  [facility] 01/25/2021: Conclusions: Mild to moderate, non-obstructive coronary artery disease, including  30% mid LAD disease, 50-60% mid/distal LCx stenosis, and 60% proximal/mid RCA lesion. Mildly to moderately reduced left ventricular systolic function with mid anterior hypo/akinesis; query atypical Takotsubo variant.  LVEF ~45%. Mildly elevated left heart filling pressures. Moderately elevated right heart filling and pulmonary artery pressures with significant pulmonary vascular resistance. Mildly reduced Fick cardiac output/index.   Recommendations: Continue IV diuresis. Advance goal-directed medical therapy for cardiomyopathy, as tolerated.  Patient may benefit from outpatient advanced heart failure consultation. Increase metoprolol for rate control of atrial fibrillation. Restart IV heparin in 2 hours after TR band removal.  Transition back to apixaban tomorrow AM if no evidence of bleeding/vascular injury. Secondary prevention of coronary artery disease. __________  2D echo 01/23/2021: 1. Left ventricular ejection fraction, by estimation, is 45 to 50%. The  left ventricle has mildly decreased function. The left ventricle  demonstrates regional wall motion abnormalities (see scoring  diagram/findings for description). There is mild left  ventricular hypertrophy. Left ventricular diastolic function could not be  evaluated. There is severe hypokinesis of the left ventricular, entire  anterior wall and anteroseptal wall.   2. Right ventricular systolic function is moderately reduced. The right  ventricular size is normal. Mildly increased right ventricular wall  thickness.   3. The mitral valve is degenerative. Trivial mitral valve regurgitation.   4. The aortic valve is tricuspid. Aortic valve regurgitation not well  assessed.  __________  2D echo 10/20/2020: 1. Left ventricular ejection fraction, by estimation, is 60 to 65%. The  left ventricle has normal function. The left ventricle has no regional  wall motion abnormalities. Left ventricular diastolic parameters are   indeterminate.  2. Right ventricular systolic function is normal. The right ventricular  size is normal. There is normal pulmonary artery systolic pressure.   3. Left atrial size was mildly dilated.   4. The mitral valve is normal in structure. Mild mitral valve  regurgitation.  __________  Renal artery ultrasound 09/14/2019: Summary:  Largest Aortic Diameter: 1.6 cm    Renal:    Right: Normal size right kidney. Normal right Resisitive Index.         Normal cortical thickness of right kidney. No evidence of         right renal artery stenosis. RRV flow present.  Left:  Cyst(s) noted. 1-59% stenosis of the left renal artery.         Normal size of left kidney. Normal left Resistive Index.         Normal cortical thickness of the left kidney. Cyst 1.62 cm x         1.71 cm.  Mesenteric:  Normal Celiac artery and Superior Mesenteric artery findings.  Patent IVC.  __________  2D echo 02/25/2015: - Left ventricle: The cavity size was normal. Systolic function was    normal. The estimated ejection fraction was in the range of 60%    to 65%. Wall motion was normal; there were no regional wall    motion abnormalities. Left ventricular diastolic function    parameters were normal.  - Mitral valve: There was mild regurgitation.  - Left atrium: The atrium was mildly dilated.  - Right ventricle: Systolic function was normal.  - Pulmonary arteries: Systolic pressure was borderline elevated. PA    peak pressure: 37 mm Hg (S).   Impressions:   - Normal study. Rhythm is normal sinus.     EKG:  EKG is ordered today.  The EKG ordered today demonstrates NSR, 63 bpm, rare PAC, baseline artifact, no acute ST-T changes  Recent Labs: 08/16/2022: ALT 18 09/17/2022: Hemoglobin 12.9; Platelets 165 02/18/2023: BUN 15; Creatinine, Ser 0.96; Potassium 4.3; Sodium 142  Recent Lipid Panel    Component Value Date/Time   CHOL 144 08/16/2022 0942   CHOL 110 01/12/2016 0817   TRIG 73 08/16/2022  0942   HDL 79 08/16/2022 0942   HDL 59 01/12/2016 0817   CHOLHDL 1.8 08/16/2022 0942   LDLCALC 50 08/16/2022 0942    PHYSICAL EXAM:    VS:  BP (!) 108/48 (BP Location: Left Arm, Patient Position: Sitting, Cuff Size: Normal)   Pulse 63   Ht 5\' 3"  (1.6 m)   Wt 152 lb 3.2 oz (69 kg)   SpO2 98%   BMI 26.96 kg/m   BMI: Body mass index is 26.96 kg/m.  Physical Exam Vitals reviewed.  Constitutional:      Appearance: She is well-developed.  HENT:     Head: Normocephalic and atraumatic.  Eyes:     General:        Right eye: No discharge.        Left eye: No discharge.  Neck:     Vascular: No JVD.  Cardiovascular:     Rate and Rhythm: Normal rate and regular rhythm.     Heart sounds: Normal heart sounds, S1 normal and S2 normal. Heart sounds not distant. No midsystolic click and no opening snap. No murmur heard.    No friction rub.  Pulmonary:     Effort: Pulmonary effort is normal. No respiratory distress.     Breath sounds: Normal breath sounds. No decreased breath sounds, wheezing, rhonchi or rales.  Chest:     Chest wall: No tenderness.  Abdominal:     General: There is no distension.  Musculoskeletal:     Cervical back: Normal range of motion.     Right lower leg: No edema.     Left lower leg: No edema.  Skin:    General: Skin is warm and dry.     Nails: There is no clubbing.  Neurological:     Mental Status: She is alert and oriented to person, place, and time.  Psychiatric:        Speech: Speech normal.        Behavior: Behavior normal.        Thought Content: Thought content normal.        Judgment: Judgment normal.     Wt Readings from Last 3 Encounters:  05/21/23 152 lb 3.2 oz (69 kg)  05/02/23 154 lb (69.9 kg)  04/26/23 159 lb (72.1 kg)     ASSESSMENT & PLAN:   Nonobstructive CAD: She is doing well and without symptoms concerning for angina or cardiac decompensation.  Continue aggressive risk factor modification and primary prevention including  apixaban in place of aspirin given underlying A-fib in an effort to minimize bleeding risk along with carvedilol and rosuvastatin.  No indication for ischemic testing at this time.  HFimpEF with NICM/pulmonary hypertension: Euvolemic and well compensated with NYHA class I symptoms.  Continue current GDMT including carvedilol 6.25 mg twice daily, Jardiance, furosemide 40 mg twice daily, and Entresto 49/51 mg twice daily.  Defer escalation of GDMT at this time given lack of heart failure symptoms and in the context of normalization of LV systolic function.  Persistent A-fib/flutter: Maintaining sinus rhythm status post ablation in 08/2022 on carvedilol.  CHA2DS2-VASc at least 7 (CHF, HTN, age x 2, DM, vascular disease, sex category).  Remains on apixaban 5 mg twice daily and does not meet reduced dosing criteria.  No falls or symptoms concerning for bleeding.  Recent labs stable.  HTN: Blood pressure is well-controlled in the office.  Remains on carvedilol 6.25 mg twice daily and Entresto 49/51 mg twice daily.  HLD: LDL 50 in 2024 with normal AST/ALT at that time.  Remains on rosuvastatin 10 mg.     Disposition: F/u with Dr. Mariah Milling or an APP in 6 months, and EP as directed.    Medication Adjustments/Labs and Tests Ordered: Current medicines are reviewed at length with the patient today.  Concerns regarding medicines are outlined above. Medication changes, Labs and Tests ordered today are summarized above and listed in the Patient Instructions accessible in Encounters.   Signed, Eula Listen, [name] 05/21/2023 3:15 PM     [facility] [place] [address] [place], [place] [address] [phone]

## 2023-05-21 NOTE — Patient Instructions (Signed)
Medication Instructions:  No changes at this time.   *If you need a refill on your cardiac medications before your next appointment, please call your pharmacy*   Lab Work: None  If you have labs (blood work) drawn today and your tests are completely normal, you will receive your results only by: [place] (if you have MyChart) OR A paper copy in the mail If you have any lab test that is abnormal or we need to change your treatment, we will call you to review the results.   Testing/Procedures: None   Follow-Up: At [facility], you and your health needs are our priority.  As part of our continuing mission to provide you with exceptional heart care, we have created designated Provider Care Teams.  These Care Teams include your primary Cardiologist (physician) and Advanced Practice Providers (APPs -  Physician Assistants and Nurse Practitioners) who all work together to provide you with the care you need, when you need it.   Your next appointment:   6 month(s)  Provider:   [name], [name]

## 2023-05-24 NOTE — Progress Notes (Signed)
  Subjective:  Patient ID: Bonnie Rowland, female    DOB: 12/06/1940,  MRN: [id number]  Bonnie Rowland presents to clinic today for preventative diabetic foot care and painful porokeratotic lesions right foot. Pain prevent(s) comfortable ambulation. Aggravating factor is weightbearing with and without shoegear.   New problem(s): None.   PCP is Alba Cory, MD.  Allergies  Allergen Reactions   Flexeril [Cyclobenzaprine] Hypertension   Cheese Other (See Comments)    bloating   Ciprofloxacin Hcl Other (See Comments)    Muscle pain   Coconut (Cocos Nucifera) Other (See Comments)    Upset stomach   Keflex [Cephalexin] Other (See Comments)    Patient prefers not to take this medication due to the side effects, caused tendon issues    Review of Systems: Negative except as noted in the HPI.  Objective: No changes noted in today's physical examination. There were no vitals filed for this visit. Bonnie Rowland is a pleasant 82 y.o. female WD, WN in NAD. AAO x 3.  Vascular Examination: Capillary refill time <3 seconds b/l LE. Palpable pedal pulses b/l LE. Digital hair diminished b/l. No pedal edema b/l. Skin temperature gradient WNL b/l. No varicosities b/l. Marland Kitchen  Dermatological Examination: Pedal integument with normal turgor, texture and tone BLE. No open wounds b/l LE. No interdigital macerations noted b/l LE.   Toenails 1-5 b/l well maintained with adequate length. No erythema, no edema, no drainage, no fluctuance.   Porokeratotic lesion(s) right heel x 3 and  submet head 3 right foot. No erythema, no edema, no drainage, no fluctuance..  Neurological Examination: Protective sensation intact with 10 gram monofilament b/l LE. Vibratory sensation intact b/l LE.   Musculoskeletal Examination: Muscle strength 5/5 to all lower extremity muscle groups bilaterally. Plantarflexed metatarsal(s) 5th metatarsal head b/l lower extremities.  Assessment/Plan: 1. Porokeratosis   2.  Diabetes mellitus without complication (HCC)     -Patient was evaluated today. All questions/concerns addressed on today's visit. -Patient to continue soft, supportive shoe gear daily. -Porokeratotic lesion(s) x 4 right foot pared and enucleated with sterile currette without incident. Total number of lesions debrided=4. -Patient/POA to call should there be question/concern in the interim.   Return in about 3 months (around 08/20/2023).  [name], DPM      [place] LOCATION: [address], [place] [address]                   Office [phone]   [facility] LOCATION: [address] [place], [place] [address] Office [phone]

## 2023-05-27 DIAGNOSIS — M5416 Radiculopathy, lumbar region: Secondary | ICD-10-CM | POA: Diagnosis not present

## 2023-05-27 DIAGNOSIS — M5033 Other cervical disc degeneration, cervicothoracic region: Secondary | ICD-10-CM | POA: Diagnosis not present

## 2023-05-27 DIAGNOSIS — M9903 Segmental and somatic dysfunction of lumbar region: Secondary | ICD-10-CM | POA: Diagnosis not present

## 2023-05-27 DIAGNOSIS — M9901 Segmental and somatic dysfunction of cervical region: Secondary | ICD-10-CM | POA: Diagnosis not present

## 2023-06-08 ENCOUNTER — Other Ambulatory Visit: Payer: Self-pay | Admitting: Family

## 2023-06-10 DIAGNOSIS — M5033 Other cervical disc degeneration, cervicothoracic region: Secondary | ICD-10-CM | POA: Diagnosis not present

## 2023-06-10 DIAGNOSIS — M5416 Radiculopathy, lumbar region: Secondary | ICD-10-CM | POA: Diagnosis not present

## 2023-06-10 DIAGNOSIS — M9901 Segmental and somatic dysfunction of cervical region: Secondary | ICD-10-CM | POA: Diagnosis not present

## 2023-06-10 DIAGNOSIS — M9903 Segmental and somatic dysfunction of lumbar region: Secondary | ICD-10-CM | POA: Diagnosis not present

## 2023-06-24 DIAGNOSIS — M5416 Radiculopathy, lumbar region: Secondary | ICD-10-CM | POA: Diagnosis not present

## 2023-06-24 DIAGNOSIS — M9903 Segmental and somatic dysfunction of lumbar region: Secondary | ICD-10-CM | POA: Diagnosis not present

## 2023-06-24 DIAGNOSIS — M9901 Segmental and somatic dysfunction of cervical region: Secondary | ICD-10-CM | POA: Diagnosis not present

## 2023-06-24 DIAGNOSIS — M5033 Other cervical disc degeneration, cervicothoracic region: Secondary | ICD-10-CM | POA: Diagnosis not present

## 2023-06-25 ENCOUNTER — Ambulatory Visit: Payer: Medicare Other | Admitting: Cardiology

## 2023-07-05 ENCOUNTER — Ambulatory Visit: Payer: Medicare Other | Admitting: Urology

## 2023-07-10 NOTE — Progress Notes (Signed)
 07/12/2023 11:25 AM   Bonnie Rowland 08/13/1940 [id number]  Referring provider: Glenard Mire, MD [address] [place],  [place] [address]  Urological history: 1. Urge incontinence -cysto (2019) - NED -contributing factors of age, obesity, caffeine, diabetes, vaginal atrophy (using vaginal estrogen cream), ACE inhibitors, alpha blockers, antiarrhythmics and diuretics -Managed with poise pads and vaginal estrogen cream   2. Renal stone -contrast CT 2021 - 3 mm left upper renal stone  3. Renal cyst -contrast CT 2021 - 12 mm left renal inferior pole cyst  HPI: Bonnie Rowland is a 83 y.o. female who presents today for follow up.   Previous records reviewed.    She is having 8 or more daytime voids, 1-2 episodes of nocturia with a mild urge to urinate.  She has stress incontinence 3 or more times weekly.  She wears 3-5 absorbent pads daily.  She does not limit fluid intake.  She does engage in toilet mapping.  Patient denies any modifying or aggravating factors.  Patient denies any recent UTI's, gross hematuria, dysuria or suprapubic/flank pain.  Patient denies any fevers, chills, nausea or vomiting.    She attributes her urinary frequency to the 40 mg of Lasix  she takes twice daily.  She is satisfied with her urinary symptoms at this time.    She is using the vaginal estrogen cream as prescribed.  She also has nystatin  cream on hand to address any rash issues she may experience.  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; left shoulder blade   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   Cataract 2003   Cellulitis and abscess of leg, except foot    Conjunctivitis unspecified    Dermatophytosis of nail    Diabetes mellitus    type II   Elevated troponin    Esophageal reflux    Hyperlipidemia    Hypertension    Osteoporosis 2016   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)    Renal artery stenosis (HCC)    Sprain of lumbar region    Thoracic or lumbosacral neuritis or radiculitis, unspecified    Urinary tract infection, site not specified     Surgical History: Past Surgical History:  Procedure Laterality Date   APPENDECTOMY     ATRIAL FIBRILLATION ABLATION N/A 09/25/2022   Procedure: ATRIAL FIBRILLATION ABLATION;  Surgeon: Cindie Ole DASEN, MD;  Location: [facility];  Service: Cardiovascular;  Laterality: N/A;   BIOPSY  02/28/2023   Procedure: BIOPSY;  Surgeon: Onita Elspeth Sharper, DO;  Location: [facility] ENDOSCOPY;  Service: Gastroenterology;;   CARDIAC CATHETERIZATION     CARDIOVERSION N/A 11/30/2020   Procedure: CARDIOVERSION;  Surgeon: Perla Evalene PARAS, MD;  Location: [facility] ORS;  Service: Cardiovascular;  Laterality: N/A;   CARDIOVERSION N/A 01/20/2021   Procedure: CARDIOVERSION;  Surgeon: Perla Evalene PARAS, MD;  Location: [facility] ORS;  Service: Cardiovascular;  Laterality: N/A;   CARDIOVERSION N/A 01/30/2021   Procedure: CARDIOVERSION;  Surgeon: Darron Deatrice LABOR, MD;  Location:  [facility] ORS;  Service: Cardiovascular;  Laterality: N/A;   cataract surgery     COLONOSCOPY     COLONOSCOPY N/A 02/28/2023   Procedure: COLONOSCOPY;  Surgeon: Onita Elspeth Sharper, DO;  Location[facility] ENDOSCOPY;  Service: Gastroenterology;  Laterality: N/A;   COLONOSCOPY WITH PROPOFOL  N/A 05/09/2016   Procedure: COLONOSCOPY WITH PROPOFOL ;  Surgeon: Reyes LELON Cota, MD;  Location: [facility] ENDOSCOPY;  Service: Endoscopy;  Laterality: N/A;   DIAGNOSTIC MAMMOGRAM     EYE SURGERY Bilateral    Cataract  Extraction   JOINT REPLACEMENT  2016   KNEE ARTHROPLASTY Right 03/30/2015   Procedure: COMPUTER ASSISTED TOTAL KNEE ARTHROPLASTY;  Surgeon: Lynwood SHAUNNA Hue, MD;  Location: [facility] ORS;  Service: Orthopedics;  Laterality: Right;   KNEE ARTHROSCOPY Right    KNEE CLOSED REDUCTION Right 05/23/2015   Procedure: CLOSED MANIPULATION KNEE;  Surgeon: Lynwood SHAUNNA Hue, MD;  Location: [facility] ORS;  Service: Orthopedics;  Laterality: Right;   POLYPECTOMY  02/28/2023   Procedure: POLYPECTOMY;  Surgeon: Onita Elspeth Sharper, DO;  Location: [facility] ENDOSCOPY;  Service: Gastroenterology;;   RIGHT/LEFT HEART CATH AND CORONARY ANGIOGRAPHY N/A 01/25/2021   Procedure: RIGHT/LEFT HEART CATH AND CORONARY ANGIOGRAPHY;  Surgeon: Mady Bruckner, MD;  Location: [facility] INVASIVE CV LAB;  Service: Cardiovascular;  Laterality: N/A;   TONSILLECTOMY      Home Medications:  Allergies as of 07/12/2023       Reactions   Flexeril  [cyclobenzaprine ] Hypertension   Cheese Other (See Comments)   bloating   Ciprofloxacin Hcl Other (See Comments)   Muscle pain   Coconut (cocos Nucifera) Other (See Comments)   Upset stomach   Keflex [cephalexin] Other (See Comments)   Patient prefers not to take this medication due to the side effects, caused tendon issues        Medication List        Accurate as of July 12, 2023 11:25 AM. If you have any questions, ask your nurse or doctor.          acetaminophen  650 MG CR tablet Commonly known as: TYLENOL  Take 1,300 mg by mouth at bedtime.   ALFALFA PO Take 1 tablet by mouth daily.   amoxicillin  500 MG tablet Commonly known as: AMOXIL  Take 2,000 mg by mouth See admin instructions. Takes before dental procedures   ascorbic Acid  500 MG Cpcr Commonly known as: VITAMIN C  Take 500 mg by mouth daily.   B-complex with vitamin C  tablet Take 1 tablet by mouth daily.   BD Pen Needle Nano U/F 32G X 4 MM Misc Generic drug: Insulin  Pen Needle USE 4 TIMES DAILY (HUMALOG  3 TIMES DAILY AND  LANTUS  ONCE DAILY) OFFICE NOTIFIED 08/06/17   BLACK ELDERBERRY(BERRY-FLOWER) PO Take 15 mLs by mouth daily. Liquid   CALCIUM  600 PO Take 600 mg by mouth daily.   carvedilol  6.25 MG tablet Commonly known as: COREG  Take 1 tablet (6.25 mg total) by mouth 2 (two) times daily with a meal.   cholecalciferol  1000 units tablet Commonly known as: VITAMIN D  Take 1,000 Units by mouth daily. shaklee   CoQ10 200 MG Caps Take 200 mg by mouth daily.   Eliquis  5 MG Tabs tablet Generic drug: apixaban  TAKE 1 TABLET BY MOUTH TWICE A DAY   empagliflozin  25 MG Tabs tablet Commonly known as: JARDIANCE  Take 25 mg by mouth daily.   Entresto  49-51 MG Generic drug: sacubitril -valsartan  TAKE 1 TABLET BY MOUTH TWICE A DAY   estradiol  0.1 MG/GM vaginal cream Commonly known as: ESTRACE  APPLY 1/2 GM  ONCE WEEKLY USING APPLICATOR, APPLY BLUEBERRY SIZED AMOUNT OF CREAM USING TIP OF FINGER TO URETHRA TWICE WEEKLY   FreeStyle Libre Sensor System Misc by Does not apply route.   furosemide  40 MG tablet Commonly known as: LASIX  TAKE 1 TABLET BY MOUTH TWICE A DAY   HumaLOG  KwikPen 100 UNIT/ML KwikPen Generic drug: insulin  lispro Inject 2-6 Units into the skin 3 (three) times daily as needed (High blood sugar).   insulin  glargine 100 unit/mL Sopn Commonly known as: LANTUS  Inject 10 Units into the skin in the morning.   Krill Oil 1000 MG Caps Take 1,000 mg by mouth daily.   Medical Compression Stockings Misc 1 each by Does not apply route daily.   Mounjaro 10 MG/0.5ML Pen Generic drug: tirzepatide Inject 10 mg into the skin once a week.   NETI POT SINUS WASH NA Place 1 Dose into the nose at bedtime.   nystatin  cream Commonly known as: MYCOSTATIN  Apply 1 Application topically 2 (two) times daily.   OVER THE COUNTER MEDICATION Place 1 application  into both eyes See admin instructions. Blephadex eyelid foam, apply to eyelids once daily   OVER THE COUNTER MEDICATION Take 1 tablet by mouth  daily. Vita-Lea Gold otc supplement With out vitamin K (Shaklee products)   OVER THE COUNTER MEDICATION Take 1 tablet by mouth daily. Nutriferon otc supplement   POTASSIUM & MAGNESIUM  ASPARTAT PO Take 1 tablet by mouth daily.   PreserVision AREDS 2 Chew Chew 1 each by mouth 2 (two) times daily.   PROBIOTIC PO Take 1 capsule by mouth at bedtime.   Prolia  60 MG/ML Sosy injection Generic drug: denosumab  Inject 60 mg into the skin every 6 (six) months.   rosuvastatin  10 MG tablet Commonly known as: CRESTOR  Take 1 tablet (10 mg total) by mouth daily.   SYSTANE BALANCE OP Place 1 drop into both eyes daily as needed (dry eyes).   tiZANidine  2 MG tablet Commonly known as: ZANAFLEX  Take 1-2 tablets (2-4 mg total) by mouth at bedtime.   Vitamin D  (Ergocalciferol ) 1.25 MG (50000 UNIT) Caps capsule Commonly known as: DRISDOL  Take 50,000 Units by mouth every Saturday.        Allergies:  Allergies  Allergen Reactions   Flexeril  [Cyclobenzaprine ] Hypertension   Cheese Other (See Comments)    bloating   Ciprofloxacin Hcl Other (See Comments)    Muscle pain   Coconut (Cocos Nucifera) Other (See Comments)    Upset stomach   Keflex [Cephalexin] Other (See Comments)    Patient prefers not to take this medication due to the side effects, caused tendon issues    Family History: Family History  Problem Relation Age of Onset   Diabetes Mother    Hypertension Mother    Cancer Mother    Kidney disease Mother    Hypertension Father    Cancer Father        Prostate   Breast cancer Maternal Aunt 48   Colon cancer Son    Kidney cancer Neg Hx    Bladder Cancer Neg Hx     Social History:  reports that she has never smoked. She has never been exposed to tobacco smoke. She has never used smokeless tobacco. She reports that she does not currently use alcohol  after a past usage of about 1.0 standard drink of alcohol  per week. She reports that she does not use drugs.  ROS: For  pertinent review of systems please refer to history of present illness  Physical Exam: BP 114/72  Pulse (!) 54   Ht 5' 3 (1.6 m)   Wt 150 lb (68 kg)   BMI 26.57 kg/m   Constitutional:  Well nourished. Alert and oriented, No acute distress. HEENT: [place] AT, moist mucus membranes.  Trachea midline Cardiovascular: No clubbing, cyanosis, or edema. Respiratory: Normal respiratory effort, no increased work of breathing. Neurologic: Grossly intact, no focal deficits, moving all 4 extremities. Psychiatric: Normal mood and affect.    Laboratory Data: Hemoglobin A1C Order: [id number] Component Ref Range & Units 2 mo ago  Hemoglobin A1C 4.2 - 5.6 % 6.7 High   Average Blood Glucose (Calc) mg/dL 853  Resulting Agency [facility] - LAB  Narrative Performed by [facility] - LAB Normal Range:    4.2 - 5.6% Increased Risk:  5.7 - 6.4% Diabetes:        >= 6.5% Glycemic Control for adults with diabetes:  <7%    Specimen Collected: 05/09/23 13:53   Performed by: [facility] - LAB Last Resulted: 05/09/23 14:05  Received From: [facility]  Result Received: 05/14/23 15:20  I have reviewed the labs.   Pertinent Imaging: N/A  Assessment and plan:  1.  Urge incontinence -at baseline -not bothersome -continue conservative management -Explained the findings of bacteria and yeast on her urinalysis and since she is not having UTI symptoms, there is no concerns, urine is not sent for culture  2. Vaginal Atrophy -continue to apply the vaginal estrogen cream 3 nights weekly   Return in about 1 year (around 07/11/2024) for OAB .  These notes generated with voice recognition software. I apologize for typographical errors.  CLOTILDA HELON RIGGERS [facility] [address] [place], [place] [address] [phone]

## 2023-07-12 ENCOUNTER — Ambulatory Visit (INDEPENDENT_AMBULATORY_CARE_PROVIDER_SITE_OTHER): Payer: Medicare Other | Admitting: Urology

## 2023-07-12 ENCOUNTER — Encounter: Payer: Self-pay | Admitting: Urology

## 2023-07-12 VITALS — BP 114/72 | HR 54 | Ht 63.0 in | Wt 150.0 lb

## 2023-07-12 DIAGNOSIS — N952 Postmenopausal atrophic vaginitis: Secondary | ICD-10-CM

## 2023-07-12 DIAGNOSIS — N3941 Urge incontinence: Secondary | ICD-10-CM | POA: Diagnosis not present

## 2023-07-19 ENCOUNTER — Other Ambulatory Visit: Payer: Self-pay | Admitting: Student

## 2023-07-24 DIAGNOSIS — Z961 Presence of intraocular lens: Secondary | ICD-10-CM | POA: Diagnosis not present

## 2023-07-24 DIAGNOSIS — H3554 Dystrophies primarily involving the retinal pigment epithelium: Secondary | ICD-10-CM | POA: Diagnosis not present

## 2023-07-24 DIAGNOSIS — H353132 Nonexudative age-related macular degeneration, bilateral, intermediate dry stage: Secondary | ICD-10-CM | POA: Diagnosis not present

## 2023-07-24 DIAGNOSIS — H353131 Nonexudative age-related macular degeneration, bilateral, early dry stage: Secondary | ICD-10-CM | POA: Diagnosis not present

## 2023-07-24 DIAGNOSIS — E119 Type 2 diabetes mellitus without complications: Secondary | ICD-10-CM | POA: Diagnosis not present

## 2023-07-24 LAB — HM DIABETES EYE EXAM

## 2023-07-29 DIAGNOSIS — M5033 Other cervical disc degeneration, cervicothoracic region: Secondary | ICD-10-CM | POA: Diagnosis not present

## 2023-07-29 DIAGNOSIS — M9903 Segmental and somatic dysfunction of lumbar region: Secondary | ICD-10-CM | POA: Diagnosis not present

## 2023-07-29 DIAGNOSIS — M9901 Segmental and somatic dysfunction of cervical region: Secondary | ICD-10-CM | POA: Diagnosis not present

## 2023-07-29 DIAGNOSIS — M5416 Radiculopathy, lumbar region: Secondary | ICD-10-CM | POA: Diagnosis not present

## 2023-07-29 NOTE — Progress Notes (Unsigned)
Electrophysiology Clinic Note    Date:  07/30/2023  Patient ID:  Bonnie Rowland, Bonnie Rowland 09-27-1940, MRN [id number] PCP:  Alba Cory, MD  Cardiologist:  Julien Nordmann, MD Electrophysiologist: Lanier Prude, MD    Discussed the use of AI scribe software for clinical note transcription with the patient, who gave verbal consent to proceed.   Patient Profile    Chief Complaint: AFib follow-up  History of Present Illness: Bonnie Rowland is a 83 y.o. female with PMH notable for persis AFib, Aflutter, HFimpEF, CAD, HTN, pulmHTN; seen today for Lanier Prude, MD for routine electrophysiology followup.  She is s/p AFib, flutter ablation with PVI, posterior wall, and CTI 08/2022.  She last saw Dr. Lalla Brothers 12/2022 without recurrence of AFib. Amiodarone stopped at that time.   On follow-up, she is doing very well. She is not aware of any recurrence of afib, but historically has had no cardiac awareness of arrhythmia. She remains very active, tai chi twice per week and leg exercises. 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   She diligently takes eliquis BID, no missed doses, no bleeding concerns.  She does have some increased edema today, likely d/t chinese food yesterday. Weight is up 1lb by home scale. No increased SOB.    AAD History: Flecainide - ineffective Amiodarone - stopped 12/2022 d/t no Afib    ROS:  Please see the history of present illness. All other systems are reviewed and otherwise negative.    Physical Exam    VS:  BP 126/60 (BP Location: Left Arm, Patient Position: Sitting, Cuff Size: Normal)   Pulse 62   Ht 5\' 3"  (1.6 m)   Wt 152 lb 9.6 oz (69.2 kg)   SpO2 97%   BMI 27.03 kg/m  BMI: Body mass index is 27.03 kg/m.  Wt Readings from Last 3 Encounters:  07/30/23 152 lb 9.6 oz (69.2 kg)  07/12/23 150 lb (68 kg)  05/21/23 152 lb 3.2 oz (69 kg)     GEN- The patient is well appearing, alert and oriented x 3 today.   Lungs- Clear to  ausculation bilaterally, normal work of breathing.  Heart- Regular rate and rhythm, no murmurs, rubs or gallops Extremities- Trace peripheral edema, warm, dry   Studies Reviewed   Previous EP, cardiology notes.    EKG is ordered. Personal review of EKG from today shows:    EKG Interpretation Date/Time:  Tuesday July 30 2023 11:05:47 EST Ventricular Rate:  62 PR Interval:  146 QRS Duration:  70 QT Interval:  424 QTC Calculation: 430 R Axis:   48  Text Interpretation: Sinus rhythm with Premature atrial complexes Low voltage QRS Confirmed by Sherie Don [phone]) on 07/30/2023 11:12:01 AM    TTE, 04/19/2023  1. Left ventricular ejection fraction, by estimation, is 60 to 65%. The left ventricle has normal function. The left ventricle has no regional wall motion abnormalities. There is mild left ventricular hypertrophy. Left ventricular diastolic parameters  are consistent with Grade II diastolic dysfunction (pseudonormalization).   2. Right ventricular systolic function is normal. The right ventricular size is normal.   3. Left atrial size was mildly dilated.   4. The mitral valve is degenerative. Mild mitral valve regurgitation.   5. The aortic valve is tricuspid. Aortic valve regurgitation is not visualized. Aortic valve sclerosis/calcification is present, without any evidence of aortic stenosis.   6. The inferior vena cava is normal in size with <50% respiratory variability, suggesting right atrial  pressure of 8 mmHg.   Cardiac CTA, 09/18/2022 1. There is normal pulmonary vein drainage into the left atrium.  2. The left atrial appendage is a chicken wing type with ostial size 26 x 22 mm and length 33 mm, Area 44 mm2. There is no thrombus in the left atrial appendage  3. The esophagus runs in the left atrial midline and is not in the proximity to any of the pulmonary veins.  4. Coronary calcium score of 171. This was 61st percentile for age and sex matched controls.   Assessment  and Plan     #) persis AFib #) aflutter S/p AFib, flutter ablation 08/2022 Amiodarone stopped  Maintaining sinus rhythm since Discussed Kardia mobile to closely monitor for afib given no cardiac awareness. Patient will consider this.   #) Hypercoag d/t persis afib CHA2DS2-VASc Score = at least 7 [CHF History: 1, HTN History: 1, Diabetes History: 1, Stroke History: 0, Vascular Disease History: 1, Age Score: 2, Gender Score: 1].  Therefore, the patient's annual risk of stroke is 11.2 %.    Stroke ppx - 5mg  eliquis BID, appropriately dosed No bleeding concerns    #) HFimpEF NYHA I symptoms Increased edema and slight weight increase likely d/t dietary indescritions Recommended to avoid high-salt foods Continue coreg, jardiance, entresto  Continue to follow-up with Adv HF team        Current medicines are reviewed at length with the patient today.   The patient does not have concerns regarding her medicines.  The following changes were made today:  none  Labs/ tests ordered today include:  Orders Placed This Encounter  Procedures   EKG 12-Lead     Disposition: Follow up with Dr. Lalla Brothers or EP APP  6-9 months    Signed, Sherie Don, NP  07/30/23  12:15 PM  Electrophysiology [facility]

## 2023-07-30 ENCOUNTER — Ambulatory Visit: Payer: Medicare Other | Attending: Cardiology | Admitting: Cardiology

## 2023-07-30 ENCOUNTER — Encounter: Payer: Self-pay | Admitting: Cardiology

## 2023-07-30 VITALS — BP 126/60 | HR 62 | Ht 63.0 in | Wt 152.6 lb

## 2023-07-30 DIAGNOSIS — I4892 Unspecified atrial flutter: Secondary | ICD-10-CM | POA: Insufficient documentation

## 2023-07-30 DIAGNOSIS — I4819 Other persistent atrial fibrillation: Secondary | ICD-10-CM | POA: Insufficient documentation

## 2023-07-30 DIAGNOSIS — I5032 Chronic diastolic (congestive) heart failure: Secondary | ICD-10-CM | POA: Diagnosis not present

## 2023-07-30 DIAGNOSIS — D6869 Other thrombophilia: Secondary | ICD-10-CM | POA: Diagnosis not present

## 2023-07-30 NOTE — Patient Instructions (Signed)
Medication Instructions:  The current medical regimen is effective;  continue present plan and medications.  *If you need a refill on your cardiac medications before your next appointment, please call your pharmacy*   Follow-Up: At [facility], you and your health needs are our priority.  As part of our continuing mission to provide you with exceptional heart care, we have created designated Provider Care Teams.  These Care Teams include your primary Cardiologist (physician) and Advanced Practice Providers (APPs -  Physician Assistants and Nurse Practitioners) who all work together to provide you with the care you need, when you need it.  We recommend signing up for the patient portal called "MyChart".  Sign up information is provided on this After Visit Summary.  MyChart is used to connect with patients for Virtual Visits (Telemedicine).  Patients are able to view lab/test results, encounter notes, upcoming appointments, etc.  Non-urgent messages can be sent to your provider as well.   To learn more about what you can do with MyChart, go to [email or web address].    Your next appointment:   6 month(s)  Provider:   Sherie Don, NP

## 2023-08-01 ENCOUNTER — Encounter: Payer: Self-pay | Admitting: Podiatry

## 2023-08-01 ENCOUNTER — Ambulatory Visit (INDEPENDENT_AMBULATORY_CARE_PROVIDER_SITE_OTHER): Payer: Medicare Other | Admitting: Podiatry

## 2023-08-01 VITALS — Ht 63.0 in | Wt 152.6 lb

## 2023-08-01 DIAGNOSIS — Q828 Other specified congenital malformations of skin: Secondary | ICD-10-CM | POA: Diagnosis not present

## 2023-08-01 DIAGNOSIS — E119 Type 2 diabetes mellitus without complications: Secondary | ICD-10-CM | POA: Diagnosis not present

## 2023-08-04 ENCOUNTER — Encounter: Payer: Self-pay | Admitting: Podiatry

## 2023-08-04 NOTE — Progress Notes (Addendum)
ANNUAL DIABETIC FOOT EXAM  Subjective: Bonnie Rowland presents today for annual diabetic foot exam. Chief Complaint  Patient presents with   Nail Problem    Pt is here for [facility] unsure of last A1C PCP is Dr Carlynn Purl and LOV was in August.   Patient confirms h/o diabetes.  Patient denies any h/o foot wounds.  Patient has been diagnosed with neuropathy.  Bonnie Cory, MD is patient's PCP.  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; left shoulder blade   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   Cataract 2003   Cellulitis and abscess of leg, except foot    Conjunctivitis unspecified    Dermatophytosis of nail    Diabetes mellitus    type II   Elevated troponin    Esophageal reflux    Hyperlipidemia    Hypertension    Osteoporosis 2016   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)    Renal artery stenosis (HCC)    Sprain of lumbar region    Thoracic or lumbosacral neuritis or radiculitis, unspecified    Urinary tract infection, site not specified    Patient Active Problem List   Diagnosis Date Noted   Muscle spasm of back 02/14/2023   Hypercoagulable state due to persistent atrial fibrillation (HCC) 10/23/2022   Rapid atrial fibrillation (HCC) 04/12/2022   Obesity (BMI 30-39.9)    Contusion of foot 03/08/2022   Pain due to onychomycosis of toenails of both feet 09/07/2021   Chronic diastolic CHF (congestive heart failure) (HCC)    AF (paroxysmal atrial fibrillation) (HCC) 01/23/2021   Controlled type 2 diabetes mellitus  without complication, without long-term current use of insulin (HCC) 01/23/2021   Plantar fasciitis, bilateral 05/21/2019   Osteoporosis, 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(HCC) 07/09/2017   Atherosclerosis of abdominal aorta (HCC) 07/09/2017   Bilateral carotid bruits 08/24/2015   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   Chronic bilateral low back pain without sciatica 01/10/2015   History of melanoma excision 01/10/2015   Spinal stenosis, lumbar region, 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   DDD (degenerative disc disease), lumbar 11/14/2014   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   Hyperlipemia 11/07/2009   Hypertension, benign 11/07/2009   Atherosclerosis of renal artery (HCC) 11/07/2009   Perennial allergic rhinitis 11/27/2007   Lumbosacral neuritis 01/17/2007   Past Surgical History:  Procedure Laterality Date   APPENDECTOMY     ATRIAL FIBRILLATION ABLATION N/A 09/25/2022   Procedure: ATRIAL FIBRILLATION ABLATION;  Surgeon: Lanier Prude, MD;  Location: [facility];  Service: Cardiovascular;  Laterality: N/A;   BIOPSY  02/28/2023   Procedure: BIOPSY;  Surgeon: Jaynie Collins, DO;  Location: [facility] ENDOSCOPY;  Service: Gastroenterology;;   CARDIAC CATHETERIZATION     CARDIOVERSION N/A 11/30/2020   Procedure: CARDIOVERSION;  Surgeon: Antonieta Iba, MD;  Location: [facility] ORS;  Service: Cardiovascular;  Laterality: N/A;   CARDIOVERSION N/A 01/20/2021   Procedure: CARDIOVERSION;  Surgeon: Antonieta Iba, MD;  Location: [facility] ORS;  Service: Cardiovascular;  Laterality: N/A;   CARDIOVERSION N/A 01/30/2021   Procedure: CARDIOVERSION;  Surgeon: Iran Ouch, MD;  Location: [facility] ORS;  Service: Cardiovascular;  Laterality: N/A;   cataract surgery     COLONOSCOPY     COLONOSCOPY N/A 02/28/2023   Procedure: COLONOSCOPY;  Surgeon: Jaynie Collins, DO;  Location: [facility] ENDOSCOPY;  Service: Gastroenterology;  Laterality: N/A;   COLONOSCOPY WITH PROPOFOL  N/A 05/09/2016   Procedure: COLONOSCOPY WITH PROPOFOL;  Surgeon: Earline Mayotte, MD;  Location: [facility] ENDOSCOPY;  Service: Endoscopy;  Laterality: N/A;   DIAGNOSTIC MAMMOGRAM     EYE SURGERY Bilateral    Cataract Extraction   JOINT REPLACEMENT  2016   KNEE ARTHROPLASTY Right 03/30/2015   Procedure: COMPUTER ASSISTED TOTAL KNEE ARTHROPLASTY;  Surgeon: Donato Heinz, MD;  Location: [facility] ORS;  Service: Orthopedics;  Laterality: Right;   KNEE ARTHROSCOPY Right    KNEE CLOSED REDUCTION Right 05/23/2015   Procedure: CLOSED MANIPULATION KNEE;  Surgeon: Donato Heinz, MD;  Location: [facility] ORS;  Service: Orthopedics;  Laterality: Right;   POLYPECTOMY  02/28/2023   Procedure: POLYPECTOMY;  Surgeon: Jaynie Collins, DO;  Location: [facility] ENDOSCOPY;  Service: Gastroenterology;;   RIGHT/LEFT HEART CATH AND CORONARY ANGIOGRAPHY N/A 01/25/2021   Procedure: RIGHT/LEFT HEART CATH AND CORONARY ANGIOGRAPHY;  Surgeon: Yvonne Kendall, MD;  Location: [facility] INVASIVE CV LAB;  Service: Cardiovascular;  Laterality: N/A;   TONSILLECTOMY     Current Outpatient Medications on File Prior to Visit  Medication Sig Dispense Refill   acetaminophen (TYLENOL) 650 MG CR tablet Take 1,300 mg by mouth at bedtime.     ALFALFA PO Take 1 tablet by mouth daily.     amoxicillin (AMOXIL) 500 MG tablet Take 2,000 mg by mouth See admin instructions. Takes before dental procedures     ascorbic Acid (VITAMIN C) 500 MG CPCR Take 500 mg by mouth daily.     B Complex-C (B-COMPLEX WITH VITAMIN C) tablet Take 1 tablet by mouth daily.     BD PEN NEEDLE NANO U/F 32G X 4 MM MISC USE 4 TIMES DAILY (HUMALOG 3 TIMES DAILY AND LANTUS ONCE DAILY) OFFICE NOTIFIED 08/06/17 90 each 1   BLACK ELDERBERRY,BERRY-FLOWER, PO Take 15 mLs by mouth daily. Liquid     Calcium Carbonate (CALCIUM 600 PO) Take 600 mg by mouth daily.     carvedilol (COREG) 6.25 MG tablet TAKE 1 TABLET BY MOUTH 2 TIMES DAILY WITH A MEAL. 180 tablet 3   cholecalciferol (VITAMIN D)  1000 UNITS tablet Take 1,000 Units by mouth daily. shaklee     Coenzyme Q10 (COQ10) 200 MG CAPS Take 200 mg by mouth daily.     Continuous Blood Gluc Sensor (FREESTYLE LIBRE SENSOR SYSTEM) MISC by Does not apply route.     Elastic Bandages & Supports (MEDICAL COMPRESSION STOCKINGS) MISC 1 each by Does not apply route daily. 2 each 5   ELIQUIS 5 MG TABS tablet TAKE 1 TABLET BY MOUTH TWICE A DAY 180 tablet 1   empagliflozin (JARDIANCE) 25 MG TABS tablet Take 25 mg by mouth daily.     ENTRESTO 49-51 MG TAKE 1 TABLET BY MOUTH TWICE A DAY 180 tablet 3   estradiol (ESTRACE) 0.1 MG/GM vaginal cream APPLY 1/2 GM ONCE WEEKLY USING APPLICATOR, APPLY BLUEBERRY SIZED AMOUNT OF CREAM USING TIP OF FINGER TO URETHRA TWICE WEEKLY 42.5 g 3   furosemide (LASIX) 40 MG tablet TAKE 1 TABLET BY MOUTH TWICE A DAY 180 tablet 3   HUMALOG KWIKPEN 100  UNIT/ML KiwkPen Inject 2-6 Units into the skin 3 (three) times daily as needed (High blood sugar).  3   insulin glargine (LANTUS) 100 unit/mL SOPN Inject 10 Units into the skin in the morning.     Krill Oil 1000 MG CAPS Take 1,000 mg by mouth daily.     Mag Aspart-Potassium Aspart (POTASSIUM & MAGNESIUM ASPARTAT PO) Take 1 tablet by mouth daily.     Multiple Vitamins-Minerals (PRESERVISION AREDS 2) CHEW Chew 1 each by mouth 2 (two) times daily.     nystatin cream (MYCOSTATIN) Apply 1 Application topically 2 (two) times daily. 30 g 0   OVER THE COUNTER MEDICATION Place 1 application  into both eyes See admin instructions. Blephadex eyelid foam, apply to eyelids once daily     OVER THE COUNTER MEDICATION Take 1 tablet by mouth daily. Vita-Lea Gold otc supplement With out vitamin K (Shaklee products)     OVER THE COUNTER MEDICATION Take 1 tablet by mouth daily. Nutriferon otc supplement     Probiotic Product (PROBIOTIC PO) Take 1 capsule by mouth at bedtime.     PROLIA 60 MG/ML SOSY injection Inject 60 mg into the skin every 6 (six) months.     Propylene Glycol (SYSTANE BALANCE  OP) Place 1 drop into both eyes daily as needed (dry eyes).     rosuvastatin (CRESTOR) 10 MG tablet Take 1 tablet (10 mg total) by mouth daily. 90 tablet 1   Sodium Chloride-Sodium Bicarb (NETI POT SINUS WASH NA) Place 1 Dose into the nose at bedtime.     tirzepatide (MOUNJARO) 10 MG/0.5ML Pen Inject 10 mg into the skin once a week.     tiZANidine (ZANAFLEX) 2 MG tablet Take 1-2 tablets (2-4 mg total) by mouth at bedtime. 180 tablet 1   Vitamin D, Ergocalciferol, (DRISDOL) 1.25 MG (50000 UT) CAPS capsule Take 50,000 Units by mouth every Saturday.     [DISCONTINUED] doxycycline (VIBRA-TABS) 100 MG tablet Take 1 tablet (100 mg total) by mouth 2 (two) times daily. 20 tablet 0   No current facility-administered medications on file prior to visit.    Allergies  Allergen Reactions   Flexeril [Cyclobenzaprine] Hypertension   Cheese Other (See Comments)    bloating   Ciprofloxacin Hcl Other (See Comments)    Muscle pain   Coconut (Cocos Nucifera) Other (See Comments)    Upset stomach   Keflex [Cephalexin] Other (See Comments)    Patient prefers not to take this medication due to the side effects, caused tendon issues   Social History   Occupational History   Occupation: Retired  Tobacco Use   Smoking status: Never    Passive exposure: Never   Smokeless tobacco: Never   Tobacco comments:    Never smoke 10/23/22  Vaping Use   Vaping status: Never Used  Substance and Sexual Activity   Alcohol use: Not Currently    Alcohol/week: 1.0 standard drink of alcohol    Types: 1 Glasses of wine per week    Comment: RARELY   Drug use: No   Sexual activity: Yes    Partners: Male    Birth control/protection: Post-menopausal   Family History  Problem Relation Age of Onset   Diabetes Mother    Hypertension Mother    Cancer Mother    Kidney disease Mother    Hypertension Father    Cancer Father        Prostate   Breast cancer Maternal Aunt 77   Colon cancer Son  Kidney cancer Neg Hx     Bladder Cancer Neg Hx    Immunization History  Administered Date(s) Administered   Fluad Quad(high Dose 65+) 03/19/2019, 04/20/2020   Influenza, High Dose Seasonal PF 03/10/2015, 03/02/2016, 03/12/2017, 04/07/2018, 04/13/2021, 04/17/2022   Influenza-Unspecified 04/25/2023   Moderna Covid-19 Vaccine Bivalent Booster 20yrs & up 05/17/2020, 11/15/2020, 03/17/2021   Moderna Sars-Covid-2 Vaccination 07/14/2019, 08/11/2019, 05/17/2020, 11/17/2020, 03/17/2021   Pfizer Covid-19 Vaccine Bivalent Booster 63yrs & up 11/28/2021, 05/11/2022   Pneumococcal Conjugate-13 07/13/2013   Pneumococcal Polysaccharide-23 03/31/2010   Respiratory Syncytial Virus Vaccine,Recomb Aduvanted(Arexvy) 05/05/2022   Rsv, Bivalent, Protein Subunit Rsvpref,pf Verdis Frederickson) 05/05/2022   Tdap 03/07/2012, 01/19/2022   Zoster Recombinant(Shingrix) 02/02/2017, 04/05/2017   Zoster, Live 06/01/2010     Review of Systems: Negative except as noted in the HPI.   Objective: There were no vitals filed for this visit.  Bonnie Rowland is a pleasant 83 y.o. female in NAD. AAO X 3.  Title   Diabetic Foot Exam - detailed Date & Time: 08/01/2023 11:15 AM Diabetic Foot exam was performed with the following findings: Yes  Visual Foot Exam completed.: Yes  Is there a history of foot ulcer?: No Is there a foot ulcer now?: No Is there swelling?: No Is there elevated skin temperature?: No Is there abnormal foot shape?: Yes Is there a claw toe deformity?: No Are the toenails long?: Yes Are the toenails thick?: Yes Are the toenails ingrown?: No Is the skin thin, fragile, shiny and hairless?": No Normal Range of Motion?: Yes Is there foot or ankle muscle weakness?: No Do you have pain in calf while walking?: No Are the shoes appropriate in style and fit?: Yes Can the patient see the bottom of their feet?: Yes Pulse Foot Exam completed.: Yes   Right Posterior Tibialis: Present Left posterior Tibialis: Present   Right Dorsalis  Pedis: Present Left Dorsalis Pedis: Present     Sensory Foot Exam Completed.: Yes Semmes-Weinstein Monofilament Test "+" means "has sensation" and "-" means "no sensation"   R Site 1-Great Toe: Pos L Site 1-Great Toe: Pos   R Site 4: Pos L Site 4: Pos   R site 5: Pos L Site 5: Pos  R Site 6: Pos L Site 6: Pos     Image components are not supported.   Image components are not supported. Image components are not supported.  [place] Comments Porokeratotic lesion(s) x 3 right foot. No erythema, no edema, no drainage, no fluctuance.  Muscle strength 5/5 to all lower extremity muscle groups bilaterally. Plantarflexed metatarsal(s) 5th metatarsal head b/l lower extremities.       Lab Results  Component Value Date   HGBA1C 6.5 01/01/2023   ADA Risk Categorization: Low Risk :  Patient has all of the following: Intact protective sensation No prior foot ulcer  No severe deformity Pedal pulses present  Assessment: 1. Porokeratosis   2. Diabetes mellitus without complication (HCC)   3. Encounter for diabetic foot exam (HCC)     Plan: -Consent given for treatment as described below: -Examined patient. -Diabetic foot examination performed today. -Continue diabetic foot care principles: inspect feet daily, monitor glucose as recommended by PCP and/or Endocrinologist, and follow prescribed diet per PCP, Endocrinologist and/or dietician. -Porokeratotic lesion(s) x 3 right foot pared and enucleated with sterile currette without incident. Total number of lesions debrided=3. -Patient/POA to call should there be question/concern in the interim. Return in about 9 weeks (around 10/03/2023).  [name], DPM  [place] LOCATION: [address] [place] [address]                   Office [phone]   [facility] LOCATION: [address] [place], [place] [address] Office [phone]

## 2023-08-06 ENCOUNTER — Ambulatory Visit
Admission: RE | Admit: 2023-08-06 | Discharge: 2023-08-06 | Disposition: A | Discharge status: Home / Self Care | Payer: Medicare Other | Source: Ambulatory Visit | Attending: Emergency Medicine | Admitting: Emergency Medicine

## 2023-08-06 VITALS — BP 133/77 | HR 77 | Temp 99.7°F | Resp 18

## 2023-08-06 DIAGNOSIS — J069 Acute upper respiratory infection, unspecified: Secondary | ICD-10-CM | POA: Insufficient documentation

## 2023-08-06 LAB — POCT INFLUENZA A/B
Influenza A, POC: NEGATIVE
Influenza B, POC: NEGATIVE

## 2023-08-06 NOTE — Discharge Instructions (Addendum)
The flu test is negative.  Your COVID test is pending.    Take Tylenol as needed for fever or discomfort.    Follow-up with your primary care provider if your symptoms are not improving.

## 2023-08-06 NOTE — ED Provider Notes (Signed)
 Bonnie Rowland    CSN: [id number] Arrival date & time: 08/06/23  1415      History   Chief Complaint Chief Complaint  Patient presents with   Fever    Entered by patient    HPI Bonnie Rowland is a 83 y.o. female.  Patient presents with 1 day history of chills, headache, body aches, runny nose, congestion, cough.  She has been treating her symptoms with Tylenol ; none taken today.  No fever or shortness of breath.  The history is provided by the patient and medical records.    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; left shoulder blade   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   Cataract 2003   Cellulitis and abscess of leg, except foot    Conjunctivitis unspecified    Dermatophytosis of nail    Diabetes mellitus    type II   Elevated troponin    Esophageal reflux    Hyperlipidemia    Hypertension    Osteoporosis 2016   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)    Renal artery stenosis (HCC)    Sprain of lumbar region    Thoracic or lumbosacral neuritis or radiculitis, unspecified    Urinary tract infection, site not specified     Patient Active Problem List   Diagnosis Date Noted   Muscle spasm of back 02/14/2023   Hypercoagulable state due to persistent atrial fibrillation (HCC) 10/23/2022   Rapid atrial fibrillation (HCC) 04/12/2022   Obesity (BMI 30-39.9)    Contusion of foot 03/08/2022   Pain due to onychomycosis of toenails of both feet 09/07/2021   Chronic diastolic CHF (congestive heart failure) (HCC)     AF (paroxysmal atrial fibrillation) (HCC) 01/23/2021   Controlled type 2 diabetes mellitus without complication, without long-term current use of insulin  (HCC) 01/23/2021   Plantar fasciitis, bilateral 05/21/2019   Osteoporosis, 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(HCC) 07/09/2017   Atherosclerosis of abdominal aorta (HCC) 07/09/2017   Bilateral carotid bruits 08/24/2015   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   Chronic bilateral low back pain without sciatica 01/10/2015   History of melanoma excision 01/10/2015   Spinal stenosis, lumbar region, 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   DDD (degenerative disc disease), lumbar 11/14/2014   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   Hyperlipemia 11/07/2009   Hypertension, benign 11/07/2009   Atherosclerosis of renal artery (HCC) 11/07/2009   Perennial allergic rhinitis 11/27/2007   Lumbosacral neuritis 01/17/2007    Past Surgical History:  Procedure Laterality Date   APPENDECTOMY     ATRIAL FIBRILLATION ABLATION N/A 09/25/2022   Procedure: ATRIAL FIBRILLATION ABLATION;  Surgeon: Cindie Ole DASEN, MD;  Location: [facility];  Service: Cardiovascular;  Laterality: N/A;   BIOPSY  02/28/2023   Procedure: BIOPSY;  Surgeon: Onita Elspeth Sharper, DO;  Location: [facility] ENDOSCOPY;  Service: Gastroenterology;;   CARDIAC CATHETERIZATION     CARDIOVERSION N/A 11/30/2020   Procedure: CARDIOVERSION;  Surgeon: Perla Evalene PARAS, MD;  Location: [facility] ORS;  Service: Cardiovascular;  Laterality: N/A;   CARDIOVERSION N/A 01/20/2021   Procedure: CARDIOVERSION;  Surgeon: Perla Evalene PARAS, MD;  Location: [facility] ORS;  Service: Cardiovascular;  Laterality: N/A;   CARDIOVERSION N/A 01/30/2021   Procedure: CARDIOVERSION;  Surgeon: Darron Deatrice LABOR, MD;  Location: [facility] ORS;  Service: Cardiovascular;  Laterality: N/A;   cataract surgery     COLONOSCOPY     COLONOSCOPY N/A 02/28/2023   Procedure: COLONOSCOPY;  Surgeon: Onita Elspeth Sharper, DO;   Location: [facility] ENDOSCOPY;  Service: Gastroenterology;  Laterality: N/A;   COLONOSCOPY WITH PROPOFOL  N/A 05/09/2016   Procedure: COLONOSCOPY WITH PROPOFOL ;  Surgeon: Reyes LELON Cota, MD;  Location: [facility] ENDOSCOPY;  Service: Endoscopy;  Laterality: N/A;   DIAGNOSTIC MAMMOGRAM     EYE SURGERY Bilateral    Cataract Extraction   JOINT REPLACEMENT  2016   KNEE ARTHROPLASTY Right 03/30/2015   Procedure: COMPUTER ASSISTED TOTAL KNEE ARTHROPLASTY;  Surgeon: Lynwood SHAUNNA Hue, MD;  Location: [facility] ORS;  Service: Orthopedics;  Laterality: Right;   KNEE ARTHROSCOPY Right    KNEE CLOSED REDUCTION Right 05/23/2015   Procedure: CLOSED MANIPULATION KNEE;  Surgeon: Lynwood SHAUNNA Hue, MD;  Location: [facility] ORS;  Service: Orthopedics;  Laterality: Right;   POLYPECTOMY  02/28/2023   Procedure: POLYPECTOMY;  Surgeon: Onita Elspeth Sharper, DO;  Location: [facility];  Service: Gastroenterology;;   RIGHT/LEFT HEART CATH AND CORONARY ANGIOGRAPHY N/A 01/25/2021   Procedure: RIGHT/LEFT HEART CATH AND CORONARY ANGIOGRAPHY;  Surgeon: Mady Bruckner, MD;  Location: [facility] INVASIVE CV LAB;  Service: Cardiovascular;  Laterality: N/A;   TONSILLECTOMY      OB History   No obstetric history on file.      Home Medications    Prior to Admission medications   Medication Sig Start Date End Date Taking? Authorizing Provider  acetaminophen  (TYLENOL ) 650 MG CR tablet Take 1,300 mg by mouth at bedtime.    [provider]  ALFALFA PO Take 1 tablet by mouth daily.    [provider]  amoxicillin  (AMOXIL ) 500 MG tablet Take 2,000 mg by mouth See admin instructions. Takes before dental procedures    [provider]  ascorbic Acid  (VITAMIN C ) 500 MG CPCR Take 500 mg by mouth daily.    [provider]  B Complex-C (B-COMPLEX WITH VITAMIN C ) tablet Take 1 tablet by mouth daily.    [provider]  BD PEN NEEDLE NANO U/F 32G X 4 MM MISC USE 4 TIMES DAILY (HUMALOG  3 TIMES DAILY AND LANTUS  ONCE  DAILY) OFFICE NOTIFIED 08/06/17 12/07/18   Glenard Mire, MD  BLACK ELDERBERRY,BERRY-FLOWER, PO Take 15 mLs by mouth daily. Liquid    [provider]  Calcium  Carbonate (CALCIUM  600 PO) Take 600 mg by mouth daily.    [provider]  carvedilol  (COREG ) 6.25 MG tablet TAKE 1 TABLET BY MOUTH 2 TIMES DAILY WITH A MEAL. 07/19/23   Loistine Sober, NP  cholecalciferol  (VITAMIN D ) 1000 UNITS tablet Take 1,000 Units by mouth daily. shaklee    [provider]  Coenzyme Q10 (COQ10) 200 MG CAPS Take 200 mg by mouth daily.    [provider]  Continuous Blood Gluc Sensor (FREESTYLE LIBRE SENSOR SYSTEM) MISC by Does not apply route.    [provider]  Elastic Bandages & Supports (MEDICAL COMPRESSION STOCKINGS) MISC 1 each by Does not apply route daily. 01/08/19   Sowles, Krichna, MD  ELIQUIS  5 MG TABS tablet TAKE 1 TABLET BY MOUTH TWICE A DAY 03/19/23   Cindie Smalls  T, MD  empagliflozin  (JARDIANCE ) 25 MG TABS tablet Take 25 mg by mouth daily. 08/26/22   [provider]  ENTRESTO  49-51 MG TAKE 1 TABLET BY MOUTH TWICE A DAY 12/10/22   Hackney, Ellouise A, FNP  estradiol  (ESTRACE ) 0.1 MG/GM vaginal cream APPLY 1/2 GM ONCE WEEKLY USING APPLICATOR, APPLY BLUEBERRY SIZED AMOUNT OF CREAM USING TIP OF FINGER TO URETHRA TWICE WEEKLY 01/18/23   McGowan, Clotilda A, [name]  furosemide  (LASIX ) 40 MG tablet TAKE 1 TABLET BY MOUTH TWICE A DAY 06/10/23   Donette Ellouise LABOR, FNP  HUMALOG  KWIKPEN 100 UNIT/ML KiwkPen Inject 2-6 Units into the skin 3 (three) times daily as needed (High blood sugar). 10/15/17   [provider]  insulin  glargine (LANTUS ) 100 unit/mL SOPN Inject 10 Units into the skin in the morning.    [provider]  Anselm Oil 1000 MG CAPS Take 1,000 mg by mouth daily.    [provider]  Mag Aspart-Potassium Aspart (POTASSIUM & MAGNESIUM  ASPARTAT PO) Take 1 tablet by mouth daily.    [provider]  Multiple Vitamins-Minerals  (PRESERVISION AREDS 2) CHEW Chew 1 each by mouth 2 (two) times daily.    [provider]  nystatin  cream (MYCOSTATIN ) Apply 1 Application topically 2 (two) times daily. 03/26/23   McGowan, Clotilda A, [name]  OVER THE COUNTER MEDICATION Place 1 application  into both eyes See admin instructions. Blephadex eyelid foam, apply to eyelids once daily    [provider]  OVER THE COUNTER MEDICATION Take 1 tablet by mouth daily. Vita-Lea Gold otc supplement With out vitamin K (Shaklee products)    [provider]  OVER THE COUNTER MEDICATION Take 1 tablet by mouth daily. Nutriferon otc supplement    [provider]  Probiotic Product (PROBIOTIC PO) Take 1 capsule by mouth at bedtime.    [provider]  PROLIA  60 MG/ML SOSY injection Inject 60 mg into the skin every 6 (six) months. 09/02/19   [provider]  Propylene Glycol (SYSTANE BALANCE OP) Place 1 drop into both eyes daily as needed (dry eyes).    [provider]  rosuvastatin  (CRESTOR ) 10 MG tablet Take 1 tablet (10 mg total) by mouth daily. 02/14/23   Sowles, Krichna, MD  Sodium Chloride -Sodium Bicarb (NETI POT SINUS WASH NA) Place 1 Dose into the nose at bedtime.    [provider]  tirzepatide CLOYDE) 10 MG/0.5ML Pen Inject 10 mg into the skin once a week. 08/23/22   [provider]  tiZANidine  (ZANAFLEX ) 2 MG tablet Take 1-2 tablets (2-4 mg total) by mouth at bedtime. 02/14/23   Sowles, Krichna, MD  Vitamin D , Ergocalciferol , (DRISDOL ) 1.25 MG (50000 UT) CAPS capsule Take 50,000 Units by mouth every Saturday. 11/23/18   [provider]  doxycycline  (VIBRA -TABS) 100 MG tablet Take 1 tablet (100 mg total) by mouth 2 (two) times daily. 01/16/22   Loreda Hacker, DPM    Family History Family History  Problem Relation Age of Onset   Diabetes Mother    Hypertension Mother    Cancer Mother    Kidney disease Mother    Hypertension Father    Cancer Father         Prostate   Breast cancer Maternal Aunt 15   Colon cancer Son    Kidney cancer Neg Hx    Bladder Cancer Neg Hx     Social History Social History   Tobacco Use   Smoking status: Never    Passive exposure:  Never   Smokeless tobacco: Never   Tobacco comments:    Never smoke 10/23/22  Vaping Use   Vaping status: Never Used  Substance Use Topics   Alcohol  use: Not Currently    Alcohol /week: 1.0 standard drink of alcohol     Types: 1 Glasses of wine per week    Comment: RARELY   Drug use: No     Allergies   Flexeril  [cyclobenzaprine ], Cheese, Ciprofloxacin hcl, Coconut (cocos nucifera), and Keflex [cephalexin]   Review of Systems Review of Systems  Constitutional:  Positive for chills.  HENT:  Positive for congestion. Negative for ear pain and sore throat.   Respiratory:  Positive for cough. Negative for shortness of breath.   Neurological:  Positive for headaches.     Physical Exam Triage Vital Signs ED Triage Vitals  Encounter Vitals Group     BP      Systolic BP Percentile      Diastolic BP Percentile      Pulse      Resp      Temp      Temp src      SpO2      Weight      Height      Head Circumference      Peak Flow      Pain Score      Pain Loc      Pain Education      Exclude from Growth Chart    No data found.  Updated Vital Signs BP 133/77   Pulse 77   Temp 99.7 F (37.6 C)   Resp 18   SpO2 95%   Visual Acuity Right Eye Distance:   Left Eye Distance:   Bilateral Distance:    Right Eye Near:   Left Eye Near:    Bilateral Near:     Physical Exam Constitutional:      General: She is not in acute distress. HENT:     Right Ear: Tympanic membrane normal.     Left Ear: Tympanic membrane normal.     Nose: Rhinorrhea present.     Mouth/Throat:     Mouth: Mucous membranes are moist.     Pharynx: Oropharynx is clear.  Cardiovascular:     Rate and Rhythm: Normal rate and regular rhythm.     Heart sounds: Normal heart sounds.  Pulmonary:      Effort: Pulmonary effort is normal. No respiratory distress.     Breath sounds: Normal breath sounds.  Neurological:     Mental Status: She is alert.      UC Treatments / Results  Labs (all labs ordered are listed, but only abnormal results are displayed) Labs Reviewed  SARS CORONAVIRUS 2 (TAT 6-24 HRS)  POCT INFLUENZA A/B    EKG   Radiology No results found.  Procedures Procedures (including critical care time)  Medications Ordered in UC Medications - No data to display  Initial Impression / Assessment and Plan / UC Course  I have reviewed the triage vital signs and the nursing notes.  Pertinent labs & imaging results that were available during my care of the patient were reviewed by me and considered in my medical decision making (see chart for details).    Viral URI.  Rapid flu negative.  COVID pending.  Discussed symptomatic treatment including Tylenol  as needed for fever or discomfort.  Instructed patient to follow up with her PCP if her symptoms are not improving.  ED precautions given.  She  agrees to plan of care.   Final Clinical Impressions(s) / UC Diagnoses   Final diagnoses:  Viral URI     Discharge Instructions      The flu test is negative.  Your COVID test is pending.    Take Tylenol  as needed for fever or discomfort.    Follow-up with your primary care provider if your symptoms are not improving.     ED Prescriptions   None    PDMP not reviewed this encounter.   Corlis Burnard DEL, NP 08/06/23 1504

## 2023-08-06 NOTE — ED Triage Notes (Signed)
Patient to Urgent Care with complaints of  runny nose/ body aches/ headaches/ nasal congestion/ cough/ chills. Possible fevers.  Symptoms started Monday.   Has been taking tylenol/ pushing water.

## 2023-08-07 LAB — SARS CORONAVIRUS 2 (TAT 6-24 HRS): SARS Coronavirus 2: NEGATIVE

## 2023-08-08 ENCOUNTER — Ambulatory Visit: Payer: Self-pay

## 2023-08-08 ENCOUNTER — Telehealth: Payer: Medicare Other | Admitting: Family Medicine

## 2023-08-08 DIAGNOSIS — H10022 Other mucopurulent conjunctivitis, left eye: Secondary | ICD-10-CM | POA: Diagnosis not present

## 2023-08-08 MED ORDER — POLYMYXIN B-TRIMETHOPRIM 10000-0.1 UNIT/ML-% OP SOLN
1.0000 [drp] | Freq: Four times a day (QID) | OPHTHALMIC | 0 refills | Status: AC
Start: 2023-08-08 — End: ?

## 2023-08-08 NOTE — Patient Instructions (Signed)
 Bonnie Rowland, thank you for joining [name] CHRISTELLA Barefoot, NP for today's virtual visit.  While this provider is not your primary care provider (PCP), if your PCP is located in our provider database this encounter information will be shared with them immediately following your visit.   A [facility] MyChart account gives you access to today's visit and all your visits, tests, and labs performed at [facility]  click here if you don't have a [facility] MyChart account or go to [email or web address]  Consent: (Patient) Bonnie Rowland provided verbal consent for this virtual visit at the beginning of the encounter.  Current Medications:  Current Outpatient Medications:    trimethoprim -polymyxin b  (POLYTRIM ) ophthalmic solution, Place 1 drop into both eyes in the morning, at noon, in the evening, and at bedtime., Disp: 10 mL, Rfl: 0   acetaminophen  (TYLENOL ) 650 MG CR tablet, Take 1,300 mg by mouth at bedtime., Disp: , Rfl:    ALFALFA PO, Take 1 tablet by mouth daily., Disp: , Rfl:    amoxicillin  (AMOXIL ) 500 MG tablet, Take 2,000 mg by mouth See admin instructions. Takes before dental procedures, Disp: , Rfl:    ascorbic Acid  (VITAMIN C ) 500 MG CPCR, Take 500 mg by mouth daily., Disp: , Rfl:    B Complex-C (B-COMPLEX WITH VITAMIN C ) tablet, Take 1 tablet by mouth daily., Disp: , Rfl:    BD PEN NEEDLE NANO U/F 32G X 4 MM MISC, USE 4 TIMES DAILY (HUMALOG  3 TIMES DAILY AND LANTUS  ONCE DAILY) OFFICE NOTIFIED 08/06/17, Disp: 90 each, Rfl: 1   BLACK ELDERBERRY,BERRY-FLOWER, PO, Take 15 mLs by mouth daily. Liquid, Disp: , Rfl:    Calcium  Carbonate (CALCIUM  600 PO), Take 600 mg by mouth daily., Disp: , Rfl:    carvedilol  (COREG ) 6.25 MG tablet, TAKE 1 TABLET BY MOUTH 2 TIMES DAILY WITH A MEAL., Disp: 180 tablet, Rfl: 3   cholecalciferol  (VITAMIN D ) 1000 UNITS tablet, Take 1,000 Units by mouth daily. shaklee, Disp: , Rfl:    Coenzyme Q10 (COQ10) 200 MG CAPS, Take 200 mg by mouth daily.,  Disp: , Rfl:    Continuous Blood Gluc Sensor (FREESTYLE LIBRE SENSOR SYSTEM) MISC, by Does not apply route., Disp: , Rfl:    Elastic Bandages & Supports (MEDICAL COMPRESSION STOCKINGS) MISC, 1 each by Does not apply route daily., Disp: 2 each, Rfl: 5   ELIQUIS  5 MG TABS tablet, TAKE 1 TABLET BY MOUTH TWICE A DAY, Disp: 180 tablet, Rfl: 1   empagliflozin  (JARDIANCE ) 25 MG TABS tablet, Take 25 mg by mouth daily., Disp: , Rfl:    ENTRESTO  49-51 MG, TAKE 1 TABLET BY MOUTH TWICE A DAY, Disp: 180 tablet, Rfl: 3   estradiol  (ESTRACE ) 0.1 MG/GM vaginal cream, APPLY 1/2 GM ONCE WEEKLY USING APPLICATOR, APPLY BLUEBERRY SIZED AMOUNT OF CREAM USING TIP OF FINGER TO URETHRA TWICE WEEKLY, Disp: 42.5 g, Rfl: 3   furosemide  (LASIX ) 40 MG tablet, TAKE 1 TABLET BY MOUTH TWICE A DAY, Disp: 180 tablet, Rfl: 3   HUMALOG  KWIKPEN 100 UNIT/ML KiwkPen, Inject 2-6 Units into the skin 3 (three) times daily as needed (High blood sugar)., Disp: , Rfl: 3   insulin  glargine (LANTUS ) 100 unit/mL SOPN, Inject 10 Units into the skin in the morning., Disp: , Rfl:    Krill Oil 1000 MG CAPS, Take 1,000 mg by mouth daily., Disp: , Rfl:    Mag Aspart-Potassium Aspart (POTASSIUM & MAGNESIUM  ASPARTAT PO), Take 1 tablet by mouth daily., Disp: , Rfl:  Multiple Vitamins-Minerals (PRESERVISION AREDS 2) CHEW, Chew 1 each by mouth 2 (two) times daily., Disp: , Rfl:    nystatin  cream (MYCOSTATIN ), Apply 1 Application topically 2 (two) times daily., Disp: 30 g, Rfl: 0   OVER THE COUNTER MEDICATION, Place 1 application  into both eyes See admin instructions. Blephadex eyelid foam, apply to eyelids once daily, Disp: , Rfl:    OVER THE COUNTER MEDICATION, Take 1 tablet by mouth daily. Vita-Lea Gold otc supplement With out vitamin K (Shaklee products), Disp: , Rfl:    OVER THE COUNTER MEDICATION, Take 1 tablet by mouth daily. Nutriferon otc supplement, Disp: , Rfl:    Probiotic Product (PROBIOTIC PO), Take 1 capsule by mouth at bedtime., Disp: ,  Rfl:    PROLIA  60 MG/ML SOSY injection, Inject 60 mg into the skin every 6 (six) months., Disp: , Rfl:    Propylene Glycol (SYSTANE BALANCE OP), Place 1 drop into both eyes daily as needed (dry eyes)., Disp: , Rfl:    rosuvastatin  (CRESTOR ) 10 MG tablet, Take 1 tablet (10 mg total) by mouth daily., Disp: 90 tablet, Rfl: 1   Sodium Chloride -Sodium Bicarb (NETI POT SINUS WASH NA), Place 1 Dose into the nose at bedtime., Disp: , Rfl:    tirzepatide (MOUNJARO) 10 MG/0.5ML Pen, Inject 10 mg into the skin once a week., Disp: , Rfl:    tiZANidine  (ZANAFLEX ) 2 MG tablet, Take 1-2 tablets (2-4 mg total) by mouth at bedtime., Disp: 180 tablet, Rfl: 1   Vitamin D , Ergocalciferol , (DRISDOL ) 1.25 MG (50000 UT) CAPS capsule, Take 50,000 Units by mouth every Saturday., Disp: , Rfl:    Medications ordered in this encounter:  Meds ordered this encounter  Medications   trimethoprim -polymyxin b  (POLYTRIM ) ophthalmic solution    Sig: Place 1 drop into both eyes in the morning, at noon, in the evening, and at bedtime.    Dispense:  10 mL    Refill:  0    Supervising Provider:   BLAISE ALEENE KIDD [[id number]]     *If you need refills on other medications prior to your next appointment, please contact your pharmacy*  Follow-Up: Call back or seek an in-person evaluation if the symptoms worsen or if the condition fails to improve as anticipated.  [facility] [phone]  Other Instructions   -Use eye medication as ordered -Advised to change pillowcase after 2-3 days of medication use to avoid reinfection -Keep eye clean and dry -Avoid rubbing -Wash hands frequently -Warm compresses used prior to eye medication -Follow up in person if not improving in next 24-48 hours of starting medication, if swelling occurs, or develop vision changes or worsening blurry vision. -Tylenol  for any discomfort    If you have been instructed to have an in-person evaluation today at a local Urgent Care  facility, please use the link below. It will take you to a list of all of our available [facility], including address, phone number and hours of operation. Please do not delay care.  [facility]  If you or a family member do not have a primary care provider, use the link below to schedule a visit and establish care. When you choose a [facility] primary care physician or advanced practice provider, you gain a long-term partner in health. Find a Primary Care Provider  Learn more about [facility]'s in-office and virtual care options: [place] - Get Care Now

## 2023-08-08 NOTE — Telephone Encounter (Signed)
 Message from [place] P sent at 08/08/2023  9:43 AM EST  Summary: eye infection   Pt went to [facility] Tuesday for a bad cold.  She now has cold in her eye like an infection.  She is asking for something for the eye infection , a cream.  CB#  [phone]  [facility] [address]         Chief Complaint: eye drainage  Symptoms: lashes stuck together, itchy, feels grainy, light hurts eye Frequency: yesterday noted drainage Pertinent Negatives: Patient denies pain or blurred vision Disposition: [] ED /[] Urgent Care (no appt availability in office) / [] Appointment(In office/virtual)/ [x]  [facility]/ [] Home Care/ [] Refused Recommended Disposition /[] [facility]/ []  Follow-up with PCP Additional Notes: pt wanted appt today   Reason for Disposition  [1] Eye with yellow or green discharge, or eyelashes stick together AND [2] NO PCP standing order to call in antibiotic eye drops   (Exception: [place]; continue triage.)  Answer Assessment - Initial Assessment Questions 1. EYE DISCHARGE: Is the discharge in one or both eyes? What color is it? How much is there? When did the discharge start?      Left eye 2. REDNESS OF SCLERA: Is the redness in one or both eyes? When did the redness start?      Not seen  3. EYELIDS: Are the eyelids red or swollen? If Yes, ask: How much?      no 4. VISION: Is there any difficulty seeing clearly?      Not as clear  5. PAIN: Is there any pain? If Yes, ask: How bad is it? (Scale 1-10; or mild, moderate, severe)    - MILD (1-3): doesn't interfere with normal activities     - MODERATE (4-7): interferes with normal activities or awakens from sleep    - SEVERE (8-10): excruciating pain, unable to do any normal activities       no 7. OTHER SYMPTOMS: Do you have any other symptoms? (e.g., fever, runny nose, cough)     Feels grainy, cold sx, itchy  Protocols used: Eye - Pus or Discharge-A-AH

## 2023-08-08 NOTE — Progress Notes (Signed)
 Virtual Visit Consent   Bonnie Rowland, you are scheduled for a virtual visit with a [facility] provider today. Just as with appointments in the office, your consent must be obtained to participate. Your consent will be active for this visit and any virtual visit you may have with one of our providers in the next 365 days. If you have a MyChart account, a copy of this consent can be sent to you electronically.  As this is a virtual visit, video technology does not allow for your provider to perform a traditional examination. This may limit your provider's ability to fully assess your condition. If your provider identifies any concerns that need to be evaluated in person or the need to arrange testing (such as labs, EKG, etc.), we will make arrangements to do so. Although advances in technology are sophisticated, we cannot ensure that it will always work on either your end or our end. If the connection with a video visit is poor, the visit may have to be switched to a telephone visit. With either a video or telephone visit, we are not always able to ensure that we have a secure connection.  By engaging in this virtual visit, you consent to the provision of healthcare and authorize for your insurance to be billed (if applicable) for the services provided during this visit. Depending on your insurance coverage, you may receive a charge related to this service.  I need to obtain your verbal consent now. Are you willing to proceed with your visit today? Bonnie Rowland has provided verbal consent on 08/08/2023 for a virtual visit (video or telephone). [name] CHRISTELLA Barefoot, NP  Date: 08/08/2023 1:34 PM  Virtual Visit via Video Note   I, [name] CHRISTELLA Barefoot, connected with  Bonnie Rowland  ([id number], [id number]) on 08/08/23 at  1:30 PM EST by a video-enabled telemedicine application and verified that I am speaking with the correct person using two identifiers.  Location: Patient: Virtual Visit Location  Patient: Home Provider: Virtual Visit Location Provider: Home Office   I discussed the limitations of evaluation and management by telemedicine and the availability of in person appointments. The patient expressed understanding and agreed to proceed.    History of Present Illness: Bonnie Rowland is a 83 y.o. who identifies as a female who was assigned female at birth, and is being seen today for eye drainage  Onset was yesterday with noted drainage Associated symptoms are yellow gunky drainage- crusting eye lid, redness, left side only. Has URI as well  Modifying factors are warm compresses Denies chest pain, shortness of breath, fevers, chills, vision changes, eye swelling, or pain with eye movement     Problems:  Patient Active Problem List   Diagnosis Date Noted   Muscle spasm of back 02/14/2023   Hypercoagulable state due to persistent atrial fibrillation (HCC) 10/23/2022   Rapid atrial fibrillation (HCC) 04/12/2022   Obesity (BMI 30-39.9)    Contusion of foot 03/08/2022   Pain due to onychomycosis of toenails of both feet 09/07/2021   Chronic diastolic CHF (congestive heart failure) (HCC)    AF (paroxysmal atrial fibrillation) (HCC) 01/23/2021   Controlled type 2 diabetes mellitus without complication, without long-term current use of insulin  (HCC) 01/23/2021   Plantar fasciitis, bilateral 05/21/2019   Osteoporosis, 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(HCC) 07/09/2017   Atherosclerosis of abdominal aorta (HCC) 07/09/2017   Bilateral carotid bruits 08/24/2015   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   Chronic bilateral low back  pain without sciatica 01/10/2015   History of melanoma excision 01/10/2015   Spinal stenosis, lumbar region, 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   DDD (degenerative disc disease), lumbar 11/14/2014   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   Hyperlipemia 11/07/2009   Hypertension, benign 11/07/2009   Atherosclerosis of renal artery  (HCC) 11/07/2009   Perennial allergic rhinitis 11/27/2007   Lumbosacral neuritis 01/17/2007    Allergies:  Allergies  Allergen Reactions   Flexeril  [Cyclobenzaprine ] Hypertension   Cheese Other (See Comments)    bloating   Ciprofloxacin Hcl Other (See Comments)    Muscle pain   Coconut (Cocos Nucifera) Other (See Comments)    Upset stomach   Keflex [Cephalexin] Other (See Comments)    Patient prefers not to take this medication due to the side effects, caused tendon issues   Medications:  Current Outpatient Medications:    acetaminophen  (TYLENOL ) 650 MG CR tablet, Take 1,300 mg by mouth at bedtime., Disp: , Rfl:    ALFALFA PO, Take 1 tablet by mouth daily., Disp: , Rfl:    amoxicillin  (AMOXIL ) 500 MG tablet, Take 2,000 mg by mouth See admin instructions. Takes before dental procedures, Disp: , Rfl:    ascorbic Acid  (VITAMIN C ) 500 MG CPCR, Take 500 mg by mouth daily., Disp: , Rfl:    B Complex-C (B-COMPLEX WITH VITAMIN C ) tablet, Take 1 tablet by mouth daily., Disp: , Rfl:    BD PEN NEEDLE NANO U/F 32G X 4 MM MISC, USE 4 TIMES DAILY (HUMALOG  3 TIMES DAILY AND LANTUS  ONCE DAILY) OFFICE NOTIFIED 08/06/17, Disp: 90 each, Rfl: 1   BLACK ELDERBERRY,BERRY-FLOWER, PO, Take 15 mLs by mouth daily. Liquid, Disp: , Rfl:    Calcium  Carbonate (CALCIUM  600 PO), Take 600 mg by mouth daily., Disp: , Rfl:    carvedilol  (COREG ) 6.25 MG tablet, TAKE 1 TABLET BY MOUTH 2 TIMES DAILY WITH A MEAL., Disp: 180 tablet, Rfl: 3   cholecalciferol  (VITAMIN D ) 1000 UNITS tablet, Take 1,000 Units by mouth daily. shaklee, Disp: , Rfl:    Coenzyme Q10 (COQ10) 200 MG CAPS, Take 200 mg by mouth daily., Disp: , Rfl:    Continuous Blood Gluc Sensor (FREESTYLE LIBRE SENSOR SYSTEM) MISC, by Does not apply route., Disp: , Rfl:    Elastic Bandages & Supports (MEDICAL COMPRESSION STOCKINGS) MISC, 1 each by Does not apply route daily., Disp: 2 each, Rfl: 5   ELIQUIS  5 MG TABS tablet, TAKE 1 TABLET BY MOUTH TWICE A DAY, Disp: 180  tablet, Rfl: 1   empagliflozin  (JARDIANCE ) 25 MG TABS tablet, Take 25 mg by mouth daily., Disp: , Rfl:    ENTRESTO  49-51 MG, TAKE 1 TABLET BY MOUTH TWICE A DAY, Disp: 180 tablet, Rfl: 3   estradiol  (ESTRACE ) 0.1 MG/GM vaginal cream, APPLY 1/2 GM ONCE WEEKLY USING APPLICATOR, APPLY BLUEBERRY SIZED AMOUNT OF CREAM USING TIP OF FINGER TO URETHRA TWICE WEEKLY, Disp: 42.5 g, Rfl: 3   furosemide  (LASIX ) 40 MG tablet, TAKE 1 TABLET BY MOUTH TWICE A DAY, Disp: 180 tablet, Rfl: 3   HUMALOG  KWIKPEN 100 UNIT/ML KiwkPen, Inject 2-6 Units into the skin 3 (three) times daily as needed (High blood sugar)., Disp: , Rfl: 3   insulin  glargine (LANTUS ) 100 unit/mL SOPN, Inject 10 Units into the skin in the morning., Disp: , Rfl:    Krill Oil 1000 MG CAPS, Take 1,000 mg by mouth daily., Disp: , Rfl:    Mag Aspart-Potassium Aspart (POTASSIUM & MAGNESIUM  ASPARTAT PO), Take 1 tablet by mouth daily., Disp: ,  Rfl:    Multiple Vitamins-Minerals (PRESERVISION AREDS 2) CHEW, Chew 1 each by mouth 2 (two) times daily., Disp: , Rfl:    nystatin  cream (MYCOSTATIN ), Apply 1 Application topically 2 (two) times daily., Disp: 30 g, Rfl: 0   OVER THE COUNTER MEDICATION, Place 1 application  into both eyes See admin instructions. Blephadex eyelid foam, apply to eyelids once daily, Disp: , Rfl:    OVER THE COUNTER MEDICATION, Take 1 tablet by mouth daily. Vita-Lea Gold otc supplement With out vitamin K (Shaklee products), Disp: , Rfl:    OVER THE COUNTER MEDICATION, Take 1 tablet by mouth daily. Nutriferon otc supplement, Disp: , Rfl:    Probiotic Product (PROBIOTIC PO), Take 1 capsule by mouth at bedtime., Disp: , Rfl:    PROLIA  60 MG/ML SOSY injection, Inject 60 mg into the skin every 6 (six) months., Disp: , Rfl:    Propylene Glycol (SYSTANE BALANCE OP), Place 1 drop into both eyes daily as needed (dry eyes)., Disp: , Rfl:    rosuvastatin  (CRESTOR ) 10 MG tablet, Take 1 tablet (10 mg total) by mouth daily., Disp: 90 tablet, Rfl: 1    Sodium Chloride -Sodium Bicarb (NETI POT SINUS WASH NA), Place 1 Dose into the nose at bedtime., Disp: , Rfl:    tirzepatide (MOUNJARO) 10 MG/0.5ML Pen, Inject 10 mg into the skin once a week., Disp: , Rfl:    tiZANidine  (ZANAFLEX ) 2 MG tablet, Take 1-2 tablets (2-4 mg total) by mouth at bedtime., Disp: 180 tablet, Rfl: 1   Vitamin D , Ergocalciferol , (DRISDOL ) 1.25 MG (50000 UT) CAPS capsule, Take 50,000 Units by mouth every Saturday., Disp: , Rfl:   Observations/Objective: Patient is well-developed, well-nourished in no acute distress.  Resting comfortably  at home.  Head is normocephalic, atraumatic.  No labored breathing.  Speech is clear and coherent with logical content.  Patient is alert and oriented at baseline.  Pink eye lids and drainage of left eye- EOEM intact  Assessment and Plan:  1. Pink eye, left (Primary)  - trimethoprim -polymyxin b  (POLYTRIM ) ophthalmic solution; Place 1 drop into both eyes in the morning, at noon, in the evening, and at bedtime.  Dispense: 10 mL; Refill: 0  You have been diagnosed with an eye infection. You will be treated with the ordered medication, please take and use as directed.  -keep eyes clean and pat dry (you can use baby soap to wash them) -warm compresses -avoid rubbing -change pillowcases -toss make up out that has been recently used -avoid contacts or products in or on the eye until it has improved.  If you develop vision changes, pain with eye movement or changes or worsening in condition please go be seen in person.   Reviewed side effects, risks and benefits of medication.    Patient acknowledged agreement and understanding of the plan.   Past Medical, Surgical, Social History, Allergies, and Medications have been Reviewed.    Follow Up Instructions: I discussed the assessment and treatment plan with the patient. The patient was provided an opportunity to ask questions and all were answered. The patient agreed with the plan  and demonstrated an understanding of the instructions.  A copy of instructions were sent to the patient via MyChart unless otherwise noted below.    The patient was advised to call back or seek an in-person evaluation if the symptoms worsen or if the condition fails to improve as anticipated.    [name] CHRISTELLA Barefoot, NP

## 2023-08-26 ENCOUNTER — Telehealth: Payer: Self-pay | Admitting: Physician Assistant

## 2023-08-26 DIAGNOSIS — M9903 Segmental and somatic dysfunction of lumbar region: Secondary | ICD-10-CM | POA: Diagnosis not present

## 2023-08-26 DIAGNOSIS — M5033 Other cervical disc degeneration, cervicothoracic region: Secondary | ICD-10-CM | POA: Diagnosis not present

## 2023-08-26 DIAGNOSIS — M9901 Segmental and somatic dysfunction of cervical region: Secondary | ICD-10-CM | POA: Diagnosis not present

## 2023-08-26 DIAGNOSIS — M5416 Radiculopathy, lumbar region: Secondary | ICD-10-CM | POA: Diagnosis not present

## 2023-08-26 NOTE — Telephone Encounter (Signed)
 Patient had 2 episode of tachycardia lasting only a few minutes this afternoon.  Her heart rate of 1 up to the 120s however quickly came down to 70s.  She is compliant with her carvedilol and Eliquis.  She is aware that her resting heart rate should be between 50-100.  If her resting heart rate is persistently over 100 bpm, she may take additional dose of carvedilol as needed.  She is going to get a Kardia mobile device to track when she going in and out of A-fib.

## 2023-09-04 ENCOUNTER — Other Ambulatory Visit: Payer: Self-pay

## 2023-09-04 ENCOUNTER — Ambulatory Visit
Admission: RE | Admit: 2023-09-04 | Discharge: 2023-09-04 | Disposition: A | Discharge status: Home / Self Care | Source: Ambulatory Visit | Attending: Emergency Medicine | Admitting: Emergency Medicine

## 2023-09-04 VITALS — BP 117/60 | HR 70 | Temp 97.8°F | Resp 18

## 2023-09-04 DIAGNOSIS — J069 Acute upper respiratory infection, unspecified: Secondary | ICD-10-CM

## 2023-09-04 LAB — POC COVID19/FLU A&B COMBO
Covid Antigen, POC: NEGATIVE
Influenza A Antigen, POC: NEGATIVE
Influenza B Antigen, POC: NEGATIVE

## 2023-09-04 MED ORDER — BENZONATATE 100 MG PO CAPS: 100.0000 mg | ORAL_CAPSULE | Freq: Three times a day (TID) | ORAL | 0 refills | Status: DC

## 2023-09-04 MED ORDER — PROMETHAZINE-DM 6.25-15 MG/5ML PO SYRP: 2.5000 mL | ORAL_SOLUTION | Freq: Every evening | ORAL | 0 refills | Status: DC | PRN

## 2023-09-04 NOTE — ED Triage Notes (Signed)
 Congestion, headache, sore throat, fever (101.3 last night) x 3 days.  Was sick a few weeks ago.

## 2023-09-04 NOTE — ED Provider Notes (Signed)
 Bonnie Rowland    CSN: [id number] Arrival date & time: 09/04/23  1328      History   Chief Complaint Chief Complaint  Patient presents with   Cough    Fever 101.3, congestion, headache - Entered by patient    HPI Bonnie Rowland is a 83 y.o. female.   Presents for evaluation of fever peaking at 101, nasal congestion, rhinorrhea, and nonproductive cough, chest congestion, intermittent sinus pressure, intermittent headaches and wheezing present for 3 days.  Has diarrhea at baseline, slightly worsening.  Associated nausea but able to tolerate some food and liquids but appetite is also decreased.  Attempted use of Tylenol.  Known sick contacts, 2 days in elementary school.   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; left shoulder blade   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   Cataract 2003   Cellulitis and abscess of leg, except foot    Conjunctivitis unspecified    Dermatophytosis of nail    Diabetes mellitus    type II   Elevated troponin    Esophageal reflux    Hyperlipidemia    Hypertension    Osteoporosis 2016   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)    Renal artery stenosis (HCC)    Sprain of lumbar region    Thoracic or lumbosacral neuritis or radiculitis, unspecified    Urinary tract infection, site not specified     Patient Active Problem List   Diagnosis Date Noted   Muscle spasm of back 02/14/2023   Hypercoagulable state due to persistent atrial fibrillation (HCC) 10/23/2022   Rapid atrial  fibrillation (HCC) 04/12/2022   Obesity (BMI 30-39.9)    Contusion of foot 03/08/2022   Pain due to onychomycosis of toenails of both feet 09/07/2021   Chronic diastolic CHF (congestive heart failure) (HCC)    AF (paroxysmal atrial fibrillation) (HCC) 01/23/2021   Controlled type 2 diabetes mellitus without complication, without long-term current use of insulin (HCC) 01/23/2021   Plantar fasciitis, bilateral 05/21/2019   Osteoporosis, 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(HCC) 07/09/2017   Atherosclerosis of abdominal aorta (HCC) 07/09/2017   Bilateral carotid bruits 08/24/2015   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   Chronic bilateral low back pain without sciatica 01/10/2015   History of melanoma excision 01/10/2015   Spinal stenosis, lumbar region, 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   DDD (degenerative disc disease), lumbar 11/14/2014   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   Hyperlipemia 11/07/2009   Hypertension, benign 11/07/2009   Atherosclerosis of renal artery (HCC) 11/07/2009   Perennial allergic rhinitis 11/27/2007   Lumbosacral neuritis 01/17/2007    Past Surgical History:  Procedure Laterality Date   APPENDECTOMY     ATRIAL FIBRILLATION ABLATION N/A 09/25/2022   Procedure: ATRIAL FIBRILLATION ABLATION;  Surgeon: Lanier Prude, MD;  Location: [facility];  Service: Cardiovascular;  Laterality: N/A;   BIOPSY  02/28/2023   Procedure: BIOPSY;  Surgeon: Jaynie Collins, DO;  Location: [facility] ENDOSCOPY;  Service: Gastroenterology;;  CARDIAC CATHETERIZATION     CARDIOVERSION N/A 11/30/2020   Procedure: CARDIOVERSION;  Surgeon: Antonieta Iba, MD;  Location: [facility] ORS;  Service: Cardiovascular;  Laterality: N/A;   CARDIOVERSION N/A 01/20/2021   Procedure: CARDIOVERSION;  Surgeon: Antonieta Iba, MD;  Location: [facility] ORS;  Service: Cardiovascular;  Laterality: N/A;   CARDIOVERSION N/A 01/30/2021   Procedure: CARDIOVERSION;  Surgeon: Iran Ouch, MD;  Location: [facility] ORS;  Service: Cardiovascular;  Laterality: N/A;   cataract surgery     COLONOSCOPY     COLONOSCOPY N/A 02/28/2023   Procedure: COLONOSCOPY;  Surgeon: Jaynie Collins, DO;  Location: [facility] ENDOSCOPY;  Service: Gastroenterology;  Laterality: N/A;   COLONOSCOPY WITH PROPOFOL N/A 05/09/2016   Procedure: COLONOSCOPY WITH PROPOFOL;  Surgeon: Earline Mayotte, MD;  Location: [facility] ENDOSCOPY;  Service: Endoscopy;  Laterality: N/A;   DIAGNOSTIC MAMMOGRAM     EYE SURGERY Bilateral    Cataract Extraction   JOINT REPLACEMENT  2016   KNEE ARTHROPLASTY Right 03/30/2015   Procedure: COMPUTER ASSISTED TOTAL KNEE ARTHROPLASTY;  Surgeon: Donato Heinz, MD;  Location: [facility] ORS;  Service: Orthopedics;  Laterality: Right;   KNEE ARTHROSCOPY Right    KNEE CLOSED REDUCTION Right 05/23/2015   Procedure: CLOSED MANIPULATION KNEE;  Surgeon: Donato Heinz, MD;  Location: [facility] ORS;  Service: Orthopedics;  Laterality: Right;   POLYPECTOMY  02/28/2023   Procedure: POLYPECTOMY;  Surgeon: Jaynie Collins, DO;  Location: [facility] ENDOSCOPY;  Service: Gastroenterology;;   RIGHT/LEFT HEART CATH AND CORONARY ANGIOGRAPHY N/A 01/25/2021   Procedure: RIGHT/LEFT HEART CATH AND CORONARY ANGIOGRAPHY;  Surgeon: Yvonne Kendall, MD;  Location: [facility] INVASIVE CV LAB;  Service: Cardiovascular;  Laterality: N/A;   TONSILLECTOMY      OB History   No obstetric history on file.      Home Medications    Prior to Admission medications   Medication Sig Start Date End Date Taking? Authorizing Provider  benzonatate (TESSALON) 100 MG capsule Take 1 capsule (100 mg total) by mouth every 8 (eight) hours. 09/04/23  Yes Sandip Power R, NP  promethazine-dextromethorphan (PROMETHAZINE-DM) 6.25-15 MG/5ML syrup Take 2.5 mLs by mouth at bedtime as needed. 09/04/23  Yes Illa Enlow, Elita Boone, NP  acetaminophen (TYLENOL) 650 MG CR tablet Take 1,300 mg by mouth at bedtime.    [provider]  ALFALFA PO  Take 1 tablet by mouth daily.    [provider]  amoxicillin (AMOXIL) 500 MG tablet Take 2,000 mg by mouth See admin instructions. Takes before dental procedures    [provider]  ascorbic Acid (VITAMIN C) 500 MG CPCR Take 500 mg by mouth daily.    [provider]  B Complex-C (B-COMPLEX WITH VITAMIN C) tablet Take 1 tablet by mouth daily.    [provider]  BD PEN NEEDLE NANO U/F 32G X 4 MM MISC USE 4 TIMES DAILY (HUMALOG 3 TIMES DAILY AND LANTUS ONCE DAILY) OFFICE NOTIFIED 08/06/17 12/07/18   Alba Cory, MD  BLACK ELDERBERRY,BERRY-FLOWER, PO Take 15 mLs by mouth daily. Liquid    [provider]  Calcium Carbonate (CALCIUM 600 PO) Take 600 mg by mouth daily.    [provider]  carvedilol (COREG) 6.25 MG tablet TAKE 1 TABLET BY MOUTH 2 TIMES DAILY WITH A MEAL. 07/19/23   Carlos Levering, NP  cholecalciferol (VITAMIN D) 1000 UNITS tablet Take 1,000 Units by mouth daily. shaklee    [provider]  Coenzyme Q10 (COQ10) 200 MG CAPS Take 200 mg by  mouth daily.    [provider]  Continuous Blood Gluc Sensor (FREESTYLE LIBRE SENSOR SYSTEM) MISC by Does not apply route.    [provider]  Elastic Bandages & Supports (MEDICAL COMPRESSION STOCKINGS) MISC 1 each by Does not apply route daily. 01/08/19   Alba Cory, MD  ELIQUIS 5 MG TABS tablet TAKE 1 TABLET BY MOUTH TWICE A DAY 03/19/23   Lanier Prude, MD  empagliflozin (JARDIANCE) 25 MG TABS tablet Take 25 mg by mouth daily. 08/26/22   [provider]  ENTRESTO 49-51 MG TAKE 1 TABLET BY MOUTH TWICE A DAY 12/10/22   Clarisa Kindred A, FNP  estradiol (ESTRACE) 0.1 MG/GM vaginal cream APPLY 1/2 GM ONCE WEEKLY USING APPLICATOR, APPLY BLUEBERRY SIZED AMOUNT OF CREAM USING TIP OF FINGER TO URETHRA TWICE WEEKLY 01/18/23   McGowan, Carollee Herter A, [name]  furosemide (LASIX) 40 MG tablet TAKE 1 TABLET BY MOUTH TWICE A DAY 06/10/23   Hackney, Tina A, FNP  HUMALOG KWIKPEN  100 UNIT/ML KiwkPen Inject 2-6 Units into the skin 3 (three) times daily as needed (High blood sugar). 10/15/17   [provider]  insulin glargine (LANTUS) 100 unit/mL SOPN Inject 10 Units into the skin in the morning.    [provider]  Boris Lown Oil 1000 MG CAPS Take 1,000 mg by mouth daily.    [provider]  Mag Aspart-Potassium Aspart (POTASSIUM & MAGNESIUM ASPARTAT PO) Take 1 tablet by mouth daily.    [provider]  Multiple Vitamins-Minerals (PRESERVISION AREDS 2) CHEW Chew 1 each by mouth 2 (two) times daily.    [provider]  nystatin cream (MYCOSTATIN) Apply 1 Application topically 2 (two) times daily. 03/26/23   McGowan, Carollee Herter A, [name]  OVER THE COUNTER MEDICATION Place 1 application  into both eyes See admin instructions. Blephadex eyelid foam, apply to eyelids once daily    [provider]  OVER THE COUNTER MEDICATION Take 1 tablet by mouth daily. Vita-Lea Gold otc supplement With out vitamin K (Shaklee products)    [provider]  OVER THE COUNTER MEDICATION Take 1 tablet by mouth daily. Nutriferon otc supplement    [provider]  Probiotic Product (PROBIOTIC PO) Take 1 capsule by mouth at bedtime.    [provider]  PROLIA 60 MG/ML SOSY injection Inject 60 mg into the skin every 6 (six) months. 09/02/19   [provider]  Propylene Glycol (SYSTANE BALANCE OP) Place 1 drop into both eyes daily as needed (dry eyes).    [provider]  rosuvastatin (CRESTOR) 10 MG tablet Take 1 tablet (10 mg total) by mouth daily. 02/14/23   Alba Cory, MD  Sodium Chloride-Sodium Bicarb (NETI POT SINUS WASH NA) Place 1 Dose into the nose at bedtime.    [provider]  tirzepatide Greggory Keen) 10 MG/0.5ML Pen Inject 10 mg into the skin once a week. 08/23/22   [provider]  tiZANidine (ZANAFLEX) 2 MG tablet Take 1-2 tablets (2-4 mg total) by mouth at bedtime. 02/14/23   Alba Cory, MD  trimethoprim-polymyxin b (POLYTRIM) ophthalmic solution Place 1 drop into both eyes in the morning, at noon, in the evening, and at bedtime. 08/08/23   Freddy Finner, NP  Vitamin D, Ergocalciferol, (DRISDOL) 1.25 MG (50000 UT) CAPS capsule Take 50,000 Units by mouth every Saturday. 11/23/18   [provider]  doxycycline (VIBRA-TABS) 100 MG tablet Take 1 tablet (100 mg total) by mouth 2 (two) times daily. 01/16/22   Stacie Acres,  Earl Lites, DPM    Family History Family History  Problem Relation Age of Onset   Diabetes Mother    Hypertension Mother    Cancer Mother    Kidney disease Mother    Hypertension Father    Cancer Father        Prostate   Breast cancer Maternal Aunt 46   Colon cancer Son    Kidney cancer Neg Hx    Bladder Cancer Neg Hx     Social History Social History   Tobacco Use   Smoking status: Never    Passive exposure: Never   Smokeless tobacco: Never   Tobacco comments:    Never smoke 10/23/22  Vaping Use   Vaping status: Never Used  Substance Use Topics   Alcohol use: Not Currently    Alcohol/week: 1.0 standard drink of alcohol    Types: 1 Glasses of wine per week    Comment: RARELY   Drug use: No     Allergies   Flexeril [cyclobenzaprine], Cheese, Ciprofloxacin hcl, Coconut (cocos nucifera), and Keflex [cephalexin]   Review of Systems Review of Systems   Physical Exam Triage Vital Signs ED Triage Vitals  Encounter Vitals Group     BP 09/04/23 1354 117/60     Systolic BP Percentile --      Diastolic BP Percentile --      Pulse Rate 09/04/23 1352 70     Resp 09/04/23 1352 18     Temp 09/04/23 1352 97.8 F (36.6 C)     Temp Source 09/04/23 1352 Temporal     SpO2 09/04/23 1352 94 %     Weight --      Height --      Head Circumference --      Peak Flow --      Pain Score 09/04/23 1354 3     Pain Loc --      Pain Education --      Exclude from Growth Chart --    No data found.  Updated Vital Signs BP 117/60 (BP  Location: Left Arm)   Pulse 70   Temp 97.8 F (36.6 C) (Temporal)   Resp 18   SpO2 94%   Visual Acuity Right Eye Distance:   Left Eye Distance:   Bilateral Distance:    Right Eye Near:   Left Eye Near:    Bilateral Near:     Physical Exam Constitutional:      Appearance: Normal appearance.  HENT:     Head: Normocephalic.     Right Ear: Tympanic membrane, ear canal and external ear normal.     Left Ear: Tympanic membrane, ear canal and external ear normal.     Nose: Congestion present. No rhinorrhea.     Mouth/Throat:     Mouth: Mucous membranes are moist.     Pharynx: Oropharynx is clear.  Eyes:     Extraocular Movements: Extraocular movements intact.  Cardiovascular:     Rate and Rhythm: Normal rate and regular rhythm.     Pulses: Normal pulses.     Heart sounds: Normal heart sounds.  Pulmonary:     Effort: Pulmonary effort is normal.     Breath sounds: Normal breath sounds.  Musculoskeletal:     Cervical back: Normal range of motion and neck supple.  Neurological:     Mental Status: She is alert and oriented to person, place, and time. Mental status is at baseline.      UC Treatments / Results  Labs (all labs ordered are listed, but only abnormal results are displayed) Labs Reviewed  POC COVID19/FLU A&B COMBO - Normal    EKG   Radiology No results found.  Procedures Procedures (including critical care time)  Medications Ordered in UC Medications - No data to display  Initial Impression / Assessment and Plan / UC Course  I have reviewed the triage vital signs and the nursing notes.  Pertinent labs & imaging results that were available during my care of the patient were reviewed by me and considered in my medical decision making (see chart for details).  Viral URI with cough  Patient is in no signs of distress nor toxic appearing.  Vital signs are stable.  Low suspicion for pneumonia, pneumothorax or bronchitis and therefore will defer imaging.   COVID and flu negative.  Declined prescription for inhaler and steroids.  Prescribed Tessalon and Promethazine DM.May use additional over-the-counter medications as needed for supportive care.  May follow-up with urgent care as needed if symptoms persist or worsen.   Final Clinical Impressions(s) / UC Diagnoses   Final diagnoses:  Viral URI with cough     Discharge Instructions      Your symptoms today are most likely being caused by a virus and should steadily improve in time it can take up to 7 to 10 days before you truly start to see a turnaround however things will get better  COVID and flu testing negative  May use Tessalon pill every 8 hours as needed for cough, may use cough syrup at bedtime, cough syrup can make you feel drowsy, be mindful this while using muscle relaxant    You can take Tylenol and/or Ibuprofen as needed for fever reduction and pain relief.   For cough: honey 1/2 to 1 teaspoon (you can dilute the honey in water or another fluid).  You can also use guaifenesin and dextromethorphan for cough. You can use a humidifier for chest congestion and cough.  If you don't have a humidifier, you can sit in the bathroom with the hot shower running.      For sore throat: try warm salt water gargles, cepacol lozenges, throat spray, warm tea or water with lemon/honey, popsicles or ice, or OTC cold relief medicine for throat discomfort.   For congestion: take a daily anti-histamine like Zyrtec, Claritin, and a oral decongestant, such as pseudoephedrine.  You can also use Flonase 1-2 sprays in each nostril daily.   It is important to stay hydrated: drink plenty of fluids (water, gatorade/powerade/pedialyte, juices, or teas) to keep your throat moisturized and help further relieve irritation/discomfort.    ED Prescriptions     Medication Sig Dispense Auth. Provider   benzonatate (TESSALON) 100 MG capsule Take 1 capsule (100 mg total) by mouth every 8 (eight) hours. 21 capsule  Dedee Liss R, NP   promethazine-dextromethorphan (PROMETHAZINE-DM) 6.25-15 MG/5ML syrup Take 2.5 mLs by mouth at bedtime as needed. 118 mL Barney Gertsch, Elita Boone, NP      PDMP not reviewed this encounter.   Valinda Hoar, [place] 09/04/23 [phone]

## 2023-09-04 NOTE — Discharge Instructions (Signed)
 Your symptoms today are most likely being caused by a virus and should steadily improve in time it can take up to 7 to 10 days before you truly start to see a turnaround however things will get better  COVID and flu testing negative  May use Tessalon pill every 8 hours as needed for cough, may use cough syrup at bedtime, cough syrup can make you feel drowsy, be mindful this while using muscle relaxant    You can take Tylenol and/or Ibuprofen as needed for fever reduction and pain relief.   For cough: honey 1/2 to 1 teaspoon (you can dilute the honey in water or another fluid).  You can also use guaifenesin and dextromethorphan for cough. You can use a humidifier for chest congestion and cough.  If you don't have a humidifier, you can sit in the bathroom with the hot shower running.      For sore throat: try warm salt water gargles, cepacol lozenges, throat spray, warm tea or water with lemon/honey, popsicles or ice, or OTC cold relief medicine for throat discomfort.   For congestion: take a daily anti-histamine like Zyrtec, Claritin, and a oral decongestant, such as pseudoephedrine.  You can also use Flonase 1-2 sprays in each nostril daily.   It is important to stay hydrated: drink plenty of fluids (water, gatorade/powerade/pedialyte, juices, or teas) to keep your throat moisturized and help further relieve irritation/discomfort.

## 2023-09-06 ENCOUNTER — Other Ambulatory Visit: Payer: Self-pay | Admitting: Family Medicine

## 2023-09-06 DIAGNOSIS — E1169 Type 2 diabetes mellitus with other specified complication: Secondary | ICD-10-CM

## 2023-09-06 DIAGNOSIS — I701 Atherosclerosis of renal artery: Secondary | ICD-10-CM

## 2023-09-06 NOTE — Telephone Encounter (Signed)
 Last visit 02/19/23.

## 2023-09-09 DIAGNOSIS — M5416 Radiculopathy, lumbar region: Secondary | ICD-10-CM | POA: Diagnosis not present

## 2023-09-09 DIAGNOSIS — M9901 Segmental and somatic dysfunction of cervical region: Secondary | ICD-10-CM | POA: Diagnosis not present

## 2023-09-09 DIAGNOSIS — M9903 Segmental and somatic dysfunction of lumbar region: Secondary | ICD-10-CM | POA: Diagnosis not present

## 2023-09-09 DIAGNOSIS — M5033 Other cervical disc degeneration, cervicothoracic region: Secondary | ICD-10-CM | POA: Diagnosis not present

## 2023-09-11 ENCOUNTER — Other Ambulatory Visit: Payer: Self-pay | Admitting: Cardiology

## 2023-09-11 DIAGNOSIS — I480 Paroxysmal atrial fibrillation: Secondary | ICD-10-CM

## 2023-09-11 NOTE — Telephone Encounter (Signed)
 Eliquis 5mg  refill request received. Patient is 83 years old, weight-69.2kg, Crea-0.96 on 02/18/23, Diagnosis-Afib, and last seen by Levy Sjogren on 07/30/23. Dose is appropriate based on dosing criteria. Will send in refill to requested pharmacy.

## 2023-09-12 DIAGNOSIS — M810 Age-related osteoporosis without current pathological fracture: Secondary | ICD-10-CM | POA: Diagnosis not present

## 2023-09-12 DIAGNOSIS — E785 Hyperlipidemia, unspecified: Secondary | ICD-10-CM | POA: Diagnosis not present

## 2023-09-12 DIAGNOSIS — E1165 Type 2 diabetes mellitus with hyperglycemia: Secondary | ICD-10-CM | POA: Diagnosis not present

## 2023-09-12 DIAGNOSIS — Z794 Long term (current) use of insulin: Secondary | ICD-10-CM | POA: Diagnosis not present

## 2023-09-12 DIAGNOSIS — I152 Hypertension secondary to endocrine disorders: Secondary | ICD-10-CM | POA: Diagnosis not present

## 2023-09-12 DIAGNOSIS — E1169 Type 2 diabetes mellitus with other specified complication: Secondary | ICD-10-CM | POA: Diagnosis not present

## 2023-09-12 DIAGNOSIS — E1159 Type 2 diabetes mellitus with other circulatory complications: Secondary | ICD-10-CM | POA: Diagnosis not present

## 2023-09-12 LAB — HEMOGLOBIN A1C: Hemoglobin A1C: 6.6

## 2023-09-13 ENCOUNTER — Ambulatory Visit (INDEPENDENT_AMBULATORY_CARE_PROVIDER_SITE_OTHER): Payer: Medicare Other | Admitting: Family Medicine

## 2023-09-13 ENCOUNTER — Encounter: Payer: Self-pay | Admitting: Family Medicine

## 2023-09-13 VITALS — BP 126/74 | HR 82 | Resp 16 | Ht 63.0 in | Wt 150.1 lb

## 2023-09-13 DIAGNOSIS — E785 Hyperlipidemia, unspecified: Secondary | ICD-10-CM | POA: Diagnosis not present

## 2023-09-13 DIAGNOSIS — I428 Other cardiomyopathies: Secondary | ICD-10-CM

## 2023-09-13 DIAGNOSIS — I700 Atherosclerosis of aorta: Secondary | ICD-10-CM

## 2023-09-13 DIAGNOSIS — I5032 Chronic diastolic (congestive) heart failure: Secondary | ICD-10-CM | POA: Diagnosis not present

## 2023-09-13 DIAGNOSIS — M62830 Muscle spasm of back: Secondary | ICD-10-CM | POA: Diagnosis not present

## 2023-09-13 DIAGNOSIS — E1169 Type 2 diabetes mellitus with other specified complication: Secondary | ICD-10-CM

## 2023-09-13 DIAGNOSIS — M810 Age-related osteoporosis without current pathological fracture: Secondary | ICD-10-CM

## 2023-09-13 DIAGNOSIS — I4819 Other persistent atrial fibrillation: Secondary | ICD-10-CM | POA: Diagnosis not present

## 2023-09-13 DIAGNOSIS — I701 Atherosclerosis of renal artery: Secondary | ICD-10-CM | POA: Diagnosis not present

## 2023-09-13 DIAGNOSIS — Z79899 Other long term (current) drug therapy: Secondary | ICD-10-CM | POA: Diagnosis not present

## 2023-09-13 NOTE — Progress Notes (Signed)
 Name: Bonnie Rowland   MRN: [id number]    DOB: 01-29-1941   Date:09/13/2023       Progress Note  Subjective  Chief Complaint  Chief Complaint  Patient presents with   Medical Management of Chronic Issues   HPI   Osteoporosis postmenopausal : she was on Evista for years but bone density got worse she was on Reclast but bone density did not improve, she is currently getting Prolia given by Dr. Gershon Crane.  We will recheck vitamin D today    DM type 2 with associated Dyslipidemia, HTN. She is under the care of   Endocrinologist - Dr. Turner Daniels  up to date with eye exam. She had A1C done recently and it was 6.6 % .   She is taking Mounjaro 10 mg weekly, Jardiance, pre-meal insulin - lower dose  and basal insulin.  Denies polyphagia, polydipsia or polyuria  . Eye exam is up to date . We will recheck labs today  Taking statin therapy for dyslipidemia, bp is at goal    HTN: BP is at goal , no chest pain, compliant with medications,    Intertrigo: she has not been using fluconazole lately, she has nystatin that she uses prn    Atherosclerosis of aorta/renal artery atherosclerosis  on statin therapy Last LDL was 50,  continue Crestor and recheck labs today    Senile purpura: both arms and hands. 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 Mariah Milling, she had an ablation done March 26 th 2024 ,  and she is feeling well, Amiodarone was stopped in June 2024  . She states she has mild lower extremity edema that is stable, she usually wears compression stocking hoses.  She denies palpitations or decrease in exercises tolerance. She is still taking Entresto, Eliquis, Carvedilol , Jardiance , Rosuvastatin also furosemide daily    Back pain: she has been taking Tizanidine every night 2-4 mg prn at night for spasms, pain is described as intermittent, aching like and states exercising daily to keep back pain under  control .  Stable    Change in bowel movements: she had a colonoscopy and small polyp,  that showed  neuroendocrine tumor but went to [facility] and  repeat test was normal. She may have IBS. She will call GI to see next steps  Patient Active Problem List   Diagnosis Date Noted   Non-ischemic cardiomyopathy (HCC) 09/13/2023   Muscle spasm of back 02/14/2023   Hypercoagulable state due to persistent atrial fibrillation (HCC) 10/23/2022   Rapid atrial fibrillation (HCC) 04/12/2022   Obesity (BMI 30-39.9)    Contusion of foot 03/08/2022   Pain due to onychomycosis of toenails of both feet 09/07/2021   Persistent atrial fibrillation (HCC)    Chronic combined systolic and diastolic congestive heart failure (HCC)    AF (paroxysmal atrial fibrillation) (HCC) 01/23/2021   Controlled type 2 diabetes mellitus without complication, without long-term current use of insulin (HCC) 01/23/2021   Plantar fasciitis, bilateral 05/21/2019   Age-related osteoporosis without current pathological fracture 11/05/2017   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(HCC) 07/09/2017   Atherosclerosis of abdominal aorta (HCC) 07/09/2017   Bilateral carotid bruits 08/24/2015   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   Chronic bilateral low back pain without sciatica 01/10/2015   History of melanoma excision 01/10/2015   Spinal stenosis, lumbar region, 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   DDD (degenerative disc disease), lumbar 11/14/2014  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   Hyperlipemia 11/07/2009   Hypertension, benign 11/07/2009   Atherosclerosis of renal artery (HCC) 11/07/2009   Perennial allergic rhinitis 11/27/2007   Lumbosacral neuritis 01/17/2007    Past Surgical History:  Procedure Laterality Date   APPENDECTOMY     ATRIAL FIBRILLATION ABLATION N/A 09/25/2022   Procedure: ATRIAL FIBRILLATION ABLATION;  Surgeon: Lanier Prude, MD;  Location: [facility];  Service: Cardiovascular;  Laterality: N/A;   BIOPSY   02/28/2023   Procedure: BIOPSY;  Surgeon: Jaynie Collins, DO;  Location: [facility] ENDOSCOPY;  Service: Gastroenterology;;   CARDIAC CATHETERIZATION     CARDIOVERSION N/A 11/30/2020   Procedure: CARDIOVERSION;  Surgeon: Antonieta Iba, MD;  Location: [facility] ORS;  Service: Cardiovascular;  Laterality: N/A;   CARDIOVERSION N/A 01/20/2021   Procedure: CARDIOVERSION;  Surgeon: Antonieta Iba, MD;  Location: [facility] ORS;  Service: Cardiovascular;  Laterality: N/A;   CARDIOVERSION N/A 01/30/2021   Procedure: CARDIOVERSION;  Surgeon: Iran Ouch, MD;  Location: [facility] ORS;  Service: Cardiovascular;  Laterality: N/A;   cataract surgery     COLONOSCOPY     COLONOSCOPY N/A 02/28/2023   Procedure: COLONOSCOPY;  Surgeon: Jaynie Collins, DO;  Location: [facility] ENDOSCOPY;  Service: Gastroenterology;  Laterality: N/A;   COLONOSCOPY WITH PROPOFOL N/A 05/09/2016   Procedure: COLONOSCOPY WITH PROPOFOL;  Surgeon: Earline Mayotte, MD;  Location: [facility] ENDOSCOPY;  Service: Endoscopy;  Laterality: N/A;   DIAGNOSTIC MAMMOGRAM     EYE SURGERY Bilateral    Cataract Extraction   JOINT REPLACEMENT  2016   KNEE ARTHROPLASTY Right 03/30/2015   Procedure: COMPUTER ASSISTED TOTAL KNEE ARTHROPLASTY;  Surgeon: Donato Heinz, MD;  Location: [facility] ORS;  Service: Orthopedics;  Laterality: Right;   KNEE ARTHROSCOPY Right    KNEE CLOSED REDUCTION Right 05/23/2015   Procedure: CLOSED MANIPULATION KNEE;  Surgeon: Donato Heinz, MD;  Location: [facility] ORS;  Service: Orthopedics;  Laterality: Right;   POLYPECTOMY  02/28/2023   Procedure: POLYPECTOMY;  Surgeon: Jaynie Collins, DO;  Location: [facility]COPY;  Service: Gastroenterology;;   RIGHT/LEFT HEART CATH AND CORONARY ANGIOGRAPHY N/A 01/25/2021   Procedure: RIGHT/LEFT HEART CATH AND CORONARY ANGIOGRAPHY;  Surgeon: Yvonne Kendall, MD;  Location: [facility] CV [facility];  Service: Cardiovascular;  Laterality: N/A;   TONSILLECTOMY      Family History  Problem Relation  Age of Onset   Diabetes Mother    Hypertension Mother    Cancer Mother    Kidney disease Mother    Hypertension Father    Cancer Father        Prostate   Breast cancer Maternal Aunt 63   Colon cancer Son    Kidney cancer Neg Hx    Bladder Cancer Neg Hx     Social History   Tobacco Use   Smoking status: Never    Passive exposure: Never   Smokeless tobacco: Never   Tobacco comments:    Never smoke 10/23/22  Substance Use Topics   Alcohol use: Not Currently    Alcohol/week: 1.0 standard drink of alcohol    Types: 1 Glasses of wine per week    Comment: RARELY     Current Outpatient Medications:    acetaminophen (TYLENOL) 650 MG CR tablet, Take 1,300 mg by mouth at bedtime., Disp: , Rfl:    ALFALFA PO, Take 1 tablet by mouth daily., Disp: , Rfl:    amoxicillin (AMOXIL) 500 MG tablet, Take 2,000 mg by mouth See admin instructions. Takes  before dental procedures, Disp: , Rfl:    ascorbic Acid (VITAMIN C) 500 MG CPCR, Take 500 mg by mouth daily., Disp: , Rfl:    B Complex-C (B-COMPLEX WITH VITAMIN C) tablet, Take 1 tablet by mouth daily., Disp: , Rfl:    BD PEN NEEDLE NANO U/F 32G X 4 MM MISC, USE 4 TIMES DAILY (HUMALOG 3 TIMES DAILY AND LANTUS ONCE DAILY) OFFICE NOTIFIED 08/06/17, Disp: 90 each, Rfl: 1   BLACK ELDERBERRY,BERRY-FLOWER, PO, Take 15 mLs by mouth daily. Liquid, Disp: , Rfl:    Calcium Carbonate (CALCIUM 600 PO), Take 600 mg by mouth daily., Disp: , Rfl:    carvedilol (COREG) 6.25 MG tablet, TAKE 1 TABLET BY MOUTH 2 TIMES DAILY WITH A MEAL., Disp: 180 tablet, Rfl: 3   cholecalciferol (VITAMIN D) 1000 UNITS tablet, Take 1,000 Units by mouth daily. shaklee, Disp: , Rfl:    Coenzyme Q10 (COQ10) 200 MG CAPS, Take 200 mg by mouth daily., Disp: , Rfl:    Continuous Glucose Sensor (DEXCOM G7 SENSOR) MISC, 1 each by Does not apply route as directed., Disp: , Rfl:    Elastic Bandages & Supports (MEDICAL COMPRESSION STOCKINGS) MISC, 1 each by Does not apply route daily., Disp: 2  each, Rfl: 5   ELIQUIS 5 MG TABS tablet, TAKE 1 TABLET BY MOUTH TWICE A DAY, Disp: 180 tablet, Rfl: 1   empagliflozin (JARDIANCE) 25 MG TABS tablet, Take 25 mg by mouth daily., Disp: , Rfl:    ENTRESTO 49-51 MG, TAKE 1 TABLET BY MOUTH TWICE A DAY, Disp: 180 tablet, Rfl: 3   estradiol (ESTRACE) 0.1 MG/GM vaginal cream, APPLY 1/2 GM ONCE WEEKLY USING APPLICATOR, APPLY BLUEBERRY SIZED AMOUNT OF CREAM USING TIP OF FINGER TO URETHRA TWICE WEEKLY, Disp: 42.5 g, Rfl: 3   furosemide (LASIX) 40 MG tablet, TAKE 1 TABLET BY MOUTH TWICE A DAY, Disp: 180 tablet, Rfl: 3   HUMALOG KWIKPEN 100 UNIT/ML KiwkPen, Inject 2-6 Units into the skin 3 (three) times daily as needed (High blood sugar)., Disp: , Rfl: 3   insulin glargine (LANTUS) 100 unit/mL SOPN, Inject 10 Units into the skin in the morning., Disp: , Rfl:    Krill Oil 1000 MG CAPS, Take 1,000 mg by mouth daily., Disp: , Rfl:    Mag Aspart-Potassium Aspart (POTASSIUM & MAGNESIUM ASPARTAT PO), Take 1 tablet by mouth daily., Disp: , Rfl:    Multiple Vitamins-Minerals (PRESERVISION AREDS 2) CHEW, Chew 1 each by mouth 2 (two) times daily., Disp: , Rfl:    nystatin cream (MYCOSTATIN), Apply 1 Application topically 2 (two) times daily., Disp: 30 g, Rfl: 0   OVER THE COUNTER MEDICATION, Place 1 application  into both eyes See admin instructions. Blephadex eyelid foam, apply to eyelids once daily, Disp: , Rfl:    OVER THE COUNTER MEDICATION, Take 1 tablet by mouth daily. Vita-Lea Gold otc supplement With out vitamin K (Shaklee products), Disp: , Rfl:    OVER THE COUNTER MEDICATION, Take 1 tablet by mouth daily. Nutriferon otc supplement, Disp: , Rfl:    Probiotic Product (PROBIOTIC PO), Take 1 capsule by mouth at bedtime., Disp: , Rfl:    PROLIA 60 MG/ML SOSY injection, Inject 60 mg into the skin every 6 (six) months., Disp: , Rfl:    Propylene Glycol (SYSTANE BALANCE OP), Place 1 drop into both eyes daily as needed (dry eyes)., Disp: , Rfl:    rosuvastatin (CRESTOR)  10 MG tablet, TAKE 1 TABLET BY MOUTH EVERY DAY, Disp: 90 tablet,  Rfl: 0   Sodium Chloride-Sodium Bicarb (NETI POT SINUS WASH NA), Place 1 Dose into the nose at bedtime., Disp: , Rfl:    tirzepatide (MOUNJARO) 10 MG/0.5ML Pen, Inject 10 mg into the skin once a week., Disp: , Rfl:    tiZANidine (ZANAFLEX) 2 MG tablet, Take 1-2 tablets (2-4 mg total) by mouth at bedtime., Disp: 180 tablet, Rfl: 1   trimethoprim-polymyxin b (POLYTRIM) ophthalmic solution, Place 1 drop into both eyes in the morning, at noon, in the evening, and at bedtime., Disp: 10 mL, Rfl: 0   Vitamin D, Ergocalciferol, (DRISDOL) 1.25 MG (50000 UT) CAPS capsule, Take 50,000 Units by mouth every Saturday., Disp: , Rfl:   Allergies  Allergen Reactions   Flexeril [Cyclobenzaprine] Hypertension   Cheese Other (See Comments)    bloating   Ciprofloxacin Hcl Other (See Comments)    Muscle pain   Coconut (Cocos Nucifera) Other (See Comments)    Upset stomach   Keflex [Cephalexin] Other (See Comments)    Patient prefers not to take this medication due to the side effects, caused tendon issues    I personally reviewed active problem list, medication list, allergies, family history with the patient/caregiver today.   ROS  Ten systems reviewed and is negative except as mentioned in HPI    Objective  Vitals:   09/13/23 1035  BP: 126/74  Pulse: 82  Resp: 16  SpO2: 97%  Weight: 150 lb 1.6 oz (68.1 kg)  Height: 5\' 3"  (1.6 m)    Body mass index is 26.59 kg/m.  Physical Exam  Constitutional: Patient appears well-developed and well-nourished. No distress.  HEENT: head atraumatic, normocephalic, pupils equal and reactive to light, neck supple Cardiovascular: Normal rate, regular rhythm and normal heart sounds.  No murmur heard. No BLE edema. Pulmonary/Chest: Effort normal and breath sounds normal. No respiratory distress. Abdominal: Soft.  There is no tenderness. Psychiatric: Patient has a normal mood and affect. behavior  is normal. Judgment and thought content normal.   Recent Results (from the past 2160 hours)  [facility] DIABETES EYE EXAM     Status: Abnormal   Collection Time: 07/24/23  7:30 AM  Result Value Ref Range   HM Diabetic Eye Exam Retinopathy (A) No Retinopathy    Comment: Abstracted by HIM  POCT Influenza A/B     Status: None   Collection Time: 08/06/23  3:02 PM  Result Value Ref Range   Influenza A, POC Negative Negative   Influenza B, POC Negative Negative  SARS CORONAVIRUS 2 (TAT 6-24 HRS) Anterior Nasal Swab     Status: None   Collection Time: 08/06/23  3:02 PM   Specimen: Anterior Nasal Swab  Result Value Ref Range   SARS Coronavirus 2 NEGATIVE NEGATIVE    Comment: (NOTE) SARS-CoV-2 target nucleic acids are NOT DETECTED.  The SARS-CoV-2 RNA is generally detectable in upper and lower respiratory specimens during the acute phase of infection. Negative results do not preclude SARS-CoV-2 infection, do not rule out co-infections with other pathogens, and should not be used as the sole basis for treatment or other patient management decisions. Negative results must be combined with clinical observations, patient history, and epidemiological information. The expected result is Negative.  Fact Sheet for Patients: HairSlick.no  Fact Sheet for Healthcare Providers: quierodirigir.com  This test is not yet approved or cleared by the [place] FDA and  has been authorized for detection and/or diagnosis of SARS-CoV-2 by FDA under an Emergency Use Authorization (EUA). This EUA will remain  in effect (meaning this test can be used) for the duration of the COVID-19 declaration under Se ction 564(b)(1) of the Act, 21 U.S.C. section 360bbb-3(b)(1), unless the authorization is terminated or revoked sooner.  Performed at [facility], [address], [place], [place] [address]   POC Covid19/Flu A&B Antigen     Status: Normal    Collection Time: 09/04/23  2:24 PM  Result Value Ref Range   Influenza A Antigen, POC Negative    Influenza B Antigen, POC Negative    Covid Antigen, POC Negative   Hemoglobin A1c     Status: None   Collection Time: 09/12/23 12:00 AM  Result Value Ref Range   Hemoglobin A1C 6.6     Diabetic Foot Exam:     PHQ2/9:    09/13/2023   10:35 AM 05/02/2023   10:34 AM 02/14/2023    9:03 AM 08/16/2022    8:33 AM 04/26/2022    1:59 PM  Depression screen PHQ 2/9  Decreased Interest 0 0 0 0 0  Down, Depressed, Hopeless 0 0 0 0 0  PHQ - 2 Score 0 0 0 0 0  Altered sleeping 0 0 0 0   Tired, decreased energy 0 0 0 0   Change in appetite 0 0 0 0   Feeling bad or failure about yourself  0 0 0 0   Trouble concentrating 0 0 0 0   Moving slowly or fidgety/restless 0 0 0 0   Suicidal thoughts 0 0 0 0   PHQ-9 Score 0 0 0 0   Difficult doing work/chores Not difficult at all Not difficult at all       phq 9 is negative  Fall Risk:    09/13/2023   10:35 AM 05/02/2023   10:38 AM 02/14/2023    9:03 AM 08/16/2022    8:33 AM 04/26/2022    1:59 PM  Fall Risk   Falls in the past year? 0 0 0 0 0  Number falls in past yr: 0 0 0 0 0  Injury with Fall? 0 0 0 0 0  Risk for fall due to : No Fall Risks No Fall Risks No Fall Risks No Fall Risks No Fall Risks  Follow up Falls prevention discussed;Education provided;Falls evaluation completed Falls prevention discussed;Falls evaluation completed Falls prevention discussed Falls prevention discussed Falls evaluation completed     Assessment & Plan  1. Atherosclerosis of abdominal aorta (HCC) (Primary)  - Lipid panel  2. Non-ischemic cardiomyopathy (HCC)  Based on echo done in 2022  3. Dyslipidemia associated with type 2 diabetes mellitus (HCC)  - Lipid panel - Microalbumin / creatinine urine ratio - COMPLETE METABOLIC PANEL WITH GFR  4. Atherosclerosis of renal artery (HCC)  - Lipid panel  5. Persistent atrial fibrillation (HCC)  Still on  Eliquis post ablation   6. Chronic combined systolic and diastolic congestive heart failure (HCC)  Doing well   7. Muscle spasm of back  Taking tizadinine prn   8. Age-related osteoporosis without current pathological fracture  - CBC with Differential/Platelet - COMPLETE METABOLIC PANEL WITH GFR - VITAMIN D 25 Hydroxy (Vit-D Deficiency, Fractures)  9. Long-term use of high-risk medication  - CBC with Differential/Platelet - COMPLETE METABOLIC PANEL WITH GFR

## 2023-09-14 LAB — LIPID PANEL
Cholesterol: 101 mg/dL (ref <200)
HDL: 54 mg/dL (ref >=50)
LDL Cholesterol (Calc): 30 mg/dL
Non-HDL Cholesterol (Calc): 47 mg/dL (ref <130)
Total CHOL/HDL Ratio: 1.9 (calc) (ref <5.0)
Triglycerides: 91 mg/dL (ref <150)

## 2023-09-14 LAB — COMPLETE METABOLIC PANEL WITH GFR
AG Ratio: 1.6 (calc) (ref 1.0–2.5)
ALT: 14 U/L (ref 6–29)
AST: 23 U/L (ref 10–35)
Albumin: 3.9 g/dL (ref 3.6–5.1)
Alkaline phosphatase (APISO): 51 U/L (ref 37–153)
BUN: 14 mg/dL (ref 7–25)
CO2: 35 mmol/L — ABNORMAL HIGH (ref 20–32)
Calcium: 9.6 mg/dL (ref 8.6–10.4)
Chloride: 99 mmol/L (ref 98–110)
Creat: 0.7 mg/dL (ref 0.60–0.95)
Globulin: 2.4 g/dL (ref 1.9–3.7)
Glucose, Bld: 102 mg/dL — ABNORMAL HIGH (ref 65–99)
Potassium: 3.9 mmol/L (ref 3.5–5.3)
Sodium: 140 mmol/L (ref 135–146)
Total Bilirubin: 0.9 mg/dL (ref 0.2–1.2)
Total Protein: 6.3 g/dL (ref 6.1–8.1)
eGFR: 87 mL/min/{1.73_m2} (ref >=60)

## 2023-09-14 LAB — CBC WITH DIFFERENTIAL/PLATELET
Absolute Lymphocytes: 1443 {cells}/uL (ref 850–3900)
Absolute Monocytes: 679 {cells}/uL (ref 200–950)
Basophils Absolute: 47 {cells}/uL (ref 0–200)
Basophils Relative: 0.6 %
Eosinophils Absolute: 101 {cells}/uL (ref 15–500)
Eosinophils Relative: 1.3 %
HCT: 40.5 % (ref 35.0–45.0)
Hemoglobin: 13.1 g/dL (ref 11.7–15.5)
MCH: 32 pg (ref 27.0–33.0)
MCHC: 32.3 g/dL (ref 32.0–36.0)
MCV: 99 fL (ref 80.0–100.0)
MPV: 10.4 fL (ref 7.5–12.5)
Monocytes Relative: 8.7 %
Neutro Abs: 5530 {cells}/uL (ref 1500–7800)
Neutrophils Relative %: 70.9 %
Platelets: 242 10*3/uL (ref 140–400)
RBC: 4.09 10*6/uL (ref 3.80–5.10)
RDW: 11.8 % (ref 11.0–15.0)
Total Lymphocyte: 18.5 %
WBC: 7.8 10*3/uL (ref 3.8–10.8)

## 2023-09-14 LAB — VITAMIN D 25 HYDROXY (VIT D DEFICIENCY, FRACTURES): Vit D, 25-Hydroxy: 119 ng/mL — ABNORMAL HIGH (ref 30–100)

## 2023-09-14 LAB — MICROALBUMIN / CREATININE URINE RATIO
Creatinine, Urine: 83 mg/dL (ref 20–275)
Microalb Creat Ratio: 11 mg/g{creat} (ref <30)
Microalb, Ur: 0.9 mg/dL

## 2023-09-16 ENCOUNTER — Encounter: Payer: Self-pay | Admitting: Family Medicine

## 2023-09-16 ENCOUNTER — Other Ambulatory Visit: Payer: Self-pay | Admitting: Family Medicine

## 2023-09-23 DIAGNOSIS — M5033 Other cervical disc degeneration, cervicothoracic region: Secondary | ICD-10-CM | POA: Diagnosis not present

## 2023-09-23 DIAGNOSIS — M5416 Radiculopathy, lumbar region: Secondary | ICD-10-CM | POA: Diagnosis not present

## 2023-09-23 DIAGNOSIS — M9901 Segmental and somatic dysfunction of cervical region: Secondary | ICD-10-CM | POA: Diagnosis not present

## 2023-09-23 DIAGNOSIS — M9903 Segmental and somatic dysfunction of lumbar region: Secondary | ICD-10-CM | POA: Diagnosis not present

## 2023-10-04 ENCOUNTER — Ambulatory Visit (INDEPENDENT_AMBULATORY_CARE_PROVIDER_SITE_OTHER): Payer: Medicare Other | Admitting: Podiatry

## 2023-10-04 ENCOUNTER — Encounter: Payer: Self-pay | Admitting: Podiatry

## 2023-10-04 DIAGNOSIS — E119 Type 2 diabetes mellitus without complications: Secondary | ICD-10-CM

## 2023-10-04 DIAGNOSIS — Q828 Other specified congenital malformations of skin: Secondary | ICD-10-CM

## 2023-10-07 DIAGNOSIS — M5033 Other cervical disc degeneration, cervicothoracic region: Secondary | ICD-10-CM | POA: Diagnosis not present

## 2023-10-07 DIAGNOSIS — M9903 Segmental and somatic dysfunction of lumbar region: Secondary | ICD-10-CM | POA: Diagnosis not present

## 2023-10-07 DIAGNOSIS — M9901 Segmental and somatic dysfunction of cervical region: Secondary | ICD-10-CM | POA: Diagnosis not present

## 2023-10-07 DIAGNOSIS — M5416 Radiculopathy, lumbar region: Secondary | ICD-10-CM | POA: Diagnosis not present

## 2023-10-10 DIAGNOSIS — M8588 Other specified disorders of bone density and structure, other site: Secondary | ICD-10-CM | POA: Diagnosis not present

## 2023-10-10 DIAGNOSIS — M810 Age-related osteoporosis without current pathological fracture: Secondary | ICD-10-CM | POA: Diagnosis not present

## 2023-10-10 LAB — HM DEXA SCAN

## 2023-10-10 NOTE — Progress Notes (Signed)
  Subjective:  Patient ID: Bonnie Rowland, female    DOB: [birth year],  MRN: [id number]  Bonnie Rowland presents to clinic today for preventative diabetic foot care and painful porokeratotic lesions right lower extremity. Pain prevent(s) comfortable ambulation. Aggravating factor is weightbearing with and without shoegear.   New problem(s): None.   PCP is Alba Cory, MD.  Allergies  Allergen Reactions   Flexeril [Cyclobenzaprine] Hypertension   Cheese Other (See Comments)    bloating   Ciprofloxacin Hcl Other (See Comments)    Muscle pain   Coconut (Cocos Nucifera) Other (See Comments)    Upset stomach   Keflex [Cephalexin] Other (See Comments)    Patient prefers not to take this medication due to the side effects, caused tendon issues    Review of Systems: Negative except as noted in the HPI.  Objective: No changes noted in today's physical examination. There were no vitals filed for this visit. Bonnie Rowland is a pleasant 83 y.o. female WD, WN in NAD. AAO x 3.  Vascular Examination: Capillary refill time immediate b/l. Vascular status intact b/l with palpable pedal pulses. Pedal hair present b/l. No pain with calf compression b/l. Skin temperature gradient WNL b/l. No cyanosis or clubbing b/l. No ischemia or gangrene noted b/l.   Neurological Examination: Sensation grossly intact b/l with 10 gram monofilament. Vibratory sensation intact b/l.   Dermatological Examination: Pedal skin with normal turgor, texture and tone b/l.  No open wounds. No interdigital macerations.   No open wounds b/l LE. No interdigital macerations noted b/l LE. Toenails recently debrided. Porokeratotic lesion(s) x 3 right lower extremity. No erythema, no edema, no drainage, no fluctuance.  Musculoskeletal Examination: Muscle strength 5/5 to all lower extremity muscle groups bilaterally. Plantarflexed metatarsal(s) 5th metatarsal head b/l feet.  Radiographs: None  Last A1c:       09/12/2023   12:00 AM 01/01/2023   12:00 AM  Hemoglobin A1C  Hemoglobin-A1c 6.6     6.5         This result is from an external source.    Assessment/Plan: 1. Porokeratosis   2. Diabetes mellitus without complication (HCC)   -Patient was evaluated today. All questions/concerns addressed on today's visit. -Continue foot and shoe inspections daily. Monitor blood glucose per PCP/Endocrinologist's recommendations. -Patient to continue soft, supportive shoe gear daily. -Porokeratotic lesion(s) x 3 right lower extremity pared and enucleated with sterile currette without incident. Total number of lesions debrided=3. -Patient/POA to call should there be question/concern in the interim.   Return in about 3 months (around 01/03/2024).  [name], DPM      [facility] LOCATION: [address] [place] [address]                   Office [phone]   [facility] LOCATION: [address] [place], [place] [address] Office [phone]

## 2023-10-11 DIAGNOSIS — Z23 Encounter for immunization: Secondary | ICD-10-CM | POA: Diagnosis not present

## 2023-10-21 DIAGNOSIS — M5033 Other cervical disc degeneration, cervicothoracic region: Secondary | ICD-10-CM | POA: Diagnosis not present

## 2023-10-21 DIAGNOSIS — M5416 Radiculopathy, lumbar region: Secondary | ICD-10-CM | POA: Diagnosis not present

## 2023-10-21 DIAGNOSIS — M9901 Segmental and somatic dysfunction of cervical region: Secondary | ICD-10-CM | POA: Diagnosis not present

## 2023-10-21 DIAGNOSIS — M9903 Segmental and somatic dysfunction of lumbar region: Secondary | ICD-10-CM | POA: Diagnosis not present

## 2023-10-28 ENCOUNTER — Telehealth: Payer: Self-pay | Admitting: Cardiology

## 2023-10-28 NOTE — Telephone Encounter (Signed)
 Called to confirm/remind patient of their appointment [facility] on 10/[facility].   Appointment:   [x] Confirmed  [] Left mess   [] No answer/No voice mail  [] VM Full/unable to leave message  [] Phone not in service  Patient reminded to bring all medications and/or complete list.  Confirmed patient has transportation. Gave directions, instructed to utilize valet parking.

## 2023-10-28 NOTE — Progress Notes (Unsigned)
 [facility] Note   PCP: Sowles, Krichna, MD (last seen 08/24) Primary Cardiologist: Pattricia Bores, NP (last seen 02/24) EP: Harvie Liner, MD (last seen 06/24)    HPI:  [name] is a 83 y/o female with a history of HTN, DM, hyperlipidemia, atrial fibrillation, GERD and chronic heart failure.   Has not been admitted or been in the ED in the last 6 months.   Echo 01/23/21: EF of 45-50% with mild LVH, severe hypokinesis of left ventricle and trivial MR.  Echo 03/31/21: EF of  60-65% without structural changes.   RHC/LHC done 01/25/21 and showed: Mild to moderate, non-obstructive coronary artery disease, including 30% mid LAD disease, 50-60% mid/distal LCx stenosis, and 60% proximal/mid RCA lesion. Mildly to moderately reduced left ventricular systolic function with mid anterior hypo/akinesis; query atypical Takotsubo variant.  LVEF ~45%. Mildly elevated left heart filling pressures. Moderately elevated right heart filling and pulmonary artery pressures with significant pulmonary vascular resistance. Mildly reduced Fick cardiac output/index.  Cardiac CT 09/18/22: Small foci of subsegmental atelectasis at the anterior lung bases and in LEFT lower lobe.  She presents today for a HF follow-up visit with a chief complaint of minimal fatigue with moderate exertion. Has associated frequent diarrhea and a little pedal edema along with this. Denies SOB, chest pain, palpitations, dizziness, difficulty sleeping or weight gain.   Is supposed to have a colonoscopy 02/28/23  Walking daily and doing tai chi for exercise. Fluctuating glucose levels. Using libre freestyle and was 161 this morning.   ROS: All systems negative except as listed in HPI, PMH and Problem List.  SH:  Social History   Socioeconomic History   Marital status: Married    Spouse name: Camera operator   Number of children: 1   Years of education: Not on file   Highest education level: Master's degree (e.g.,  MA, MS, MEng, MEd, MSW, MBA)  Occupational History   Occupation: Retired  Tobacco Use   Smoking status: Never    Passive exposure: Never   Smokeless tobacco: Never   Tobacco comments:    Never smoke 10/23/22  Vaping Use   Vaping status: Never Used  Substance and Sexual Activity   Alcohol  use: Not Currently    Alcohol /week: 1.0 standard drink of alcohol     Types: 1 Glasses of wine per week    Comment: RARELY   Drug use: No   Sexual activity: Yes    Partners: Male    Birth control/protection: Post-menopausal  Other Topics Concern   Not on file  Social History Narrative   She is married , only has one son and he was diagnosed with colon cancer at age 23.    Social Drivers of Corporate investment banker Strain: Low Risk  (05/02/2023)   Overall Financial Resource Strain (CARDIA)    Difficulty of Paying Living Expenses: Not hard at all  Food Insecurity: No Food Insecurity (05/02/2023)   Hunger Vital Sign    Worried About Running Out of Food in the Last Year: Never true    Ran Out of Food in the Last Year: Never true  Transportation Needs: No Transportation Needs (05/02/2023)   PRAPARE - Administrator, Civil Service (Medical): No    Lack of Transportation (Non-Medical): No  Physical Activity: Sufficiently Active (05/02/2023)   Exercise Vital Sign    Days of Exercise per Week: 7 days    Minutes of Exercise per Session: 30 min  Stress: No Stress Concern Present (05/02/2023)  [employer] of Occupational Health - Occupational Stress Questionnaire    Feeling of Stress : Only a little  Social Connections: Socially Integrated (05/02/2023)   Social Connection and Isolation Panel [NHANES]    Frequency of Communication with Friends and Family: More than three times a week    Frequency of Social Gatherings with Friends and Family: More than three times a week    Attends Religious Services: More than 4 times per year    Active Member of [employer] or Organizations: Yes     Attends Engineer, structural: More than 4 times per year    Marital Status: Married  Catering manager Violence: Not At Risk (05/02/2023)   Humiliation, Afraid, Rape, and Kick questionnaire    Fear of Current or Ex-Partner: No    Emotionally Abused: No    Physically Abused: No    Sexually Abused: No    FH:  Family History  Problem Relation Age of Onset   Diabetes Mother    Hypertension Mother    Cancer Mother    Kidney disease Mother    Hypertension Father    Cancer Father        Prostate   Breast cancer Maternal Aunt 40   Colon cancer Son    Kidney cancer Neg Hx    Bladder Cancer Neg Hx     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; left shoulder blade   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     ALFALFA PO Take 1 tablet by mouth daily.     amoxicillin  (AMOXIL ) 500 MG tablet Take 2,000 mg by mouth See admin  instructions. Takes before dental procedures     ascorbic Acid  (VITAMIN C ) 500 MG CPCR Take 500 mg by mouth daily.     B Complex-C (B-COMPLEX WITH VITAMIN C ) tablet Take 1 tablet by mouth daily.     BD PEN NEEDLE NANO U/F 32G X 4 MM MISC USE 4 TIMES DAILY (HUMALOG  3 TIMES DAILY AND LANTUS  ONCE DAILY) OFFICE NOTIFIED 08/06/17 90 each 1   BLACK ELDERBERRY,BERRY-FLOWER, PO Take 15 mLs by mouth daily. Liquid     Calcium  Carbonate (CALCIUM  600 PO) Take 600 mg by mouth daily.     carvedilol  (COREG ) 6.25 MG tablet TAKE 1 TABLET BY  MOUTH 2 TIMES DAILY WITH A MEAL. 180 tablet 3   cholecalciferol  (VITAMIN D ) 1000 UNITS tablet Take 1,000 Units by mouth daily. shaklee     Coenzyme Q10 (COQ10) 200 MG CAPS Take 200 mg by mouth daily.     Continuous Glucose Sensor (DEXCOM G7 SENSOR) MISC 1 each by Does not apply route as directed.     Elastic Bandages & Supports (MEDICAL COMPRESSION STOCKINGS) MISC 1 each by Does not apply route daily. 2 each 5   ELIQUIS  5 MG TABS tablet TAKE 1 TABLET BY MOUTH TWICE A DAY 180 tablet 1   empagliflozin (JARDIANCE) 25 MG TABS tablet Take 25 mg by mouth daily.     ENTRESTO  49-51 MG TAKE 1 TABLET BY MOUTH TWICE A DAY 180 tablet 3   estradiol  (ESTRACE ) 0.1 MG/GM vaginal cream APPLY 1/2 GM ONCE WEEKLY USING APPLICATOR, APPLY BLUEBERRY SIZED AMOUNT OF CREAM USING TIP OF FINGER TO URETHRA TWICE WEEKLY 42.5 g 3   furosemide  (LASIX ) 40 MG tablet TAKE 1 TABLET BY MOUTH TWICE A DAY 180 tablet 3   HUMALOG  KWIKPEN 100 UNIT/ML KiwkPen Inject 2-6 Units into the skin 3 (three) times daily as needed (High blood sugar).  3   insulin  glargine (LANTUS ) 100 unit/mL SOPN Inject 10 Units into the skin in the morning.     Krill Oil 1000 MG CAPS Take 1,000 mg by mouth daily.     Mag Aspart-Potassium Aspart (POTASSIUM & MAGNESIUM  ASPARTAT PO) Take 1 tablet by mouth daily.     Multiple Vitamins-Minerals (PRESERVISION AREDS 2) CHEW Chew 1 each by mouth 2 (two) times daily.     nystatin  cream (MYCOSTATIN )  Apply 1 Application topically 2 (two) times daily. 30 g 0   OVER THE COUNTER MEDICATION Place 1 application  into both eyes See admin instructions. Blephadex eyelid foam, apply to eyelids once daily     OVER THE COUNTER MEDICATION Take 1 tablet by mouth daily. Vita-Lea Gold otc supplement With out vitamin K (Shaklee products)     OVER THE COUNTER MEDICATION Take 1 tablet by mouth daily. Nutriferon otc supplement     Probiotic Product (PROBIOTIC PO) Take 1 capsule by mouth at bedtime.     PROLIA  60 MG/ML SOSY injection Inject 60 mg into the skin every 6 (six) months.     Propylene Glycol (SYSTANE BALANCE OP) Place 1 drop into both eyes daily as needed (dry eyes).     rosuvastatin  (CRESTOR ) 10 MG tablet TAKE 1 TABLET BY MOUTH EVERY DAY 90 tablet 0   Sodium Chloride -Sodium Bicarb (NETI POT SINUS WASH NA) Place 1 Dose into the nose at bedtime.     tirzepatide (MOUNJARO) 10 MG/0.5ML Pen Inject 10 mg into the skin once a week.     tiZANidine  (ZANAFLEX ) 2 MG tablet Take 1-2 tablets (2-4 mg total) by mouth at bedtime. 180 tablet 1   trimethoprim -polymyxin b  (POLYTRIM ) ophthalmic solution Place 1 drop into both eyes in the morning, at noon, in the evening, and at bedtime. 10 mL 0   No current facility-administered medications for this visit.   There were no vitals filed for this visit.  Wt Readings from Last 3 Encounters:  09/13/23 150 lb 1.6 oz (68.1 kg)  08/01/23 152 lb 9.6 oz (69.2 kg)  07/30/23 152 lb 9.6 oz (69.2 kg)   Lab Results  Component Value Date   CREATININE 0.70 09/13/2023   CREATININE 0.96 02/18/2023   CREATININE 0.92 10/17/2022   PHYSICAL EXAM:  General:  Well  appearing. No resp difficulty HEENT: normal Neck: supple. JVP flat. Carotids 2+ bilaterally; no bruits. No lymphadenopathy or thryomegaly appreciated. Cor: PMI normal. Regular rate & rhythm. No rubs, gallops or murmurs. Lungs: clear Abdomen: soft, nontender, nondistended. No hepatosplenomegaly. No bruits or masses.  Good bowel sounds. Extremities: no cyanosis, clubbing, rash, edema Neuro: alert & orientedx3, cranial nerves grossly intact. Moves all 4 extremities w/o difficulty. Affect pleasant.   ECG: NSR with nonspecific changes, HR 74 (personally reviewed)   ASSESSMENT & PLAN:  1: NICM with preserved ejection fraction- - suspect due to atrial fibrillation - NYHA class II - euvolemic today - weighing daily; reminded to call for an overnight weight gain of > 2 pounds or a weekly weight gain of > 5 pounds.  - weight stable from her last visit here 4 months ago - Echo 01/23/21: EF of 45-50% with mild LVH, severe hypokinesis of left ventricle and trivial MR.  - Echo 03/31/21: EF of  60-65% without structural changes.  - have scheduled updated echo for 04/19/23 - RHC/LHC done 01/25/21; Mild to moderate, non-obstructive coronary artery disease, including 30% mid LAD disease, 50-60% mid/distal LCx stenosis, and 60% proximal/mid RCA lesion. Mildly to moderately reduced left ventricular systolic function with mid anterior hypo/akinesis; query atypical Takotsubo variant.  LVEF ~45%. Mildly elevated left heart filling pressures. Moderately elevated right heart filling and pulmonary artery pressures with significant pulmonary vascular resistance. Mildly reduced Fick cardiac output/index. - EKG is NSR - is trying to walk between 3000-5000 steps daily - not adding salt and has been reading food labels - saw cardiology Marjory Signs) 02/24 - continue entresto  49/51 BID - continue jardiance 25mg  daily (DM dose) - continue carvedilol  6.25mg  BID - continue furosemide  40mg  BID - consider spironolactone - wearing compression socks daily and removes HS - BNP 04/11/22 was 347.5  2: HTN- - BP 122/46 - saw PCP Ava Lei) 08/24 - BMP 10/17/22 reviewed and showed sodium 141, potassium 3.6, creatinine 0.92  & GFR >60 - BMP today  3: DM- - A1c 01/01/23 was 6.5% - saw endocrinology Shelvy Dickens) 07/24  4: Paroxysmal atrial  fibrillation- - unsuccessfully cardioverted twice 10/23 - saw EP Marven Slimmer) 06/243 - continue eliquis  5mg  BID - no longer on amiodarone  - ablation done 03/24 - went to AF clinic 04/24  Per telephone note 11/29/22, will have patient hold eliquis  2 days prior to procedure (colonoscopy) & then resume after procedure. EKG normal today and BMP drawn. Cardiac clearance for her colonoscopy provided today.   Return the week after echo is done, sooner if needed.      Charlette Console, FNP 10/28/23

## 2023-10-29 ENCOUNTER — Other Ambulatory Visit
Admission: RE | Admit: 2023-10-29 | Discharge: 2023-10-29 | Disposition: A | Discharge status: Home / Self Care | Source: Ambulatory Visit | Attending: Family | Admitting: Family

## 2023-10-29 ENCOUNTER — Ambulatory Visit (HOSPITAL_BASED_OUTPATIENT_CLINIC_OR_DEPARTMENT_OTHER): Payer: Medicare Other | Admitting: Family

## 2023-10-29 ENCOUNTER — Encounter: Payer: Self-pay | Admitting: Family

## 2023-10-29 VITALS — BP 136/55 | HR 58 | Wt 152.1 lb

## 2023-10-29 DIAGNOSIS — E1169 Type 2 diabetes mellitus with other specified complication: Secondary | ICD-10-CM | POA: Diagnosis not present

## 2023-10-29 DIAGNOSIS — I491 Atrial premature depolarization: Secondary | ICD-10-CM | POA: Diagnosis not present

## 2023-10-29 DIAGNOSIS — I4819 Other persistent atrial fibrillation: Secondary | ICD-10-CM | POA: Diagnosis present

## 2023-10-29 DIAGNOSIS — E785 Hyperlipidemia, unspecified: Secondary | ICD-10-CM | POA: Diagnosis not present

## 2023-10-29 DIAGNOSIS — I480 Paroxysmal atrial fibrillation: Secondary | ICD-10-CM | POA: Diagnosis not present

## 2023-10-29 DIAGNOSIS — I5032 Chronic diastolic (congestive) heart failure: Secondary | ICD-10-CM

## 2023-10-29 DIAGNOSIS — I2510 Atherosclerotic heart disease of native coronary artery without angina pectoris: Secondary | ICD-10-CM

## 2023-10-29 DIAGNOSIS — I10 Essential (primary) hypertension: Secondary | ICD-10-CM

## 2023-10-29 DIAGNOSIS — I492 Junctional premature depolarization: Secondary | ICD-10-CM | POA: Diagnosis not present

## 2023-10-29 LAB — BASIC METABOLIC PANEL WITH GFR
Anion gap: 9 (ref 5–15)
BUN: 15 mg/dL (ref 8–23)
CO2: 30 mmol/L (ref 22–32)
Calcium: 9.1 mg/dL (ref 8.9–10.3)
Chloride: 101 mmol/L (ref 98–111)
Creatinine, Ser: 0.67 mg/dL (ref 0.44–1.00)
GFR, Estimated: >60 mL/min (ref >60)
Glucose, Bld: 115 mg/dL — ABNORMAL HIGH (ref 70–99)
Potassium: 3.6 mmol/L (ref 3.5–5.1)
Sodium: 140 mmol/L (ref 135–145)

## 2023-10-29 LAB — MAGNESIUM: Magnesium: 2.5 mg/dL — ABNORMAL HIGH (ref 1.7–2.4)

## 2023-10-29 MED ORDER — TORSEMIDE 20 MG PO TABS: 60.0000 mg | ORAL_TABLET | Freq: Every day | ORAL | 5 refills | Status: DC

## 2023-10-29 NOTE — Progress Notes (Signed)
 [facility] - HEART FAILURE CLINIC - PHARMACIST COUNSELING NOTE  Guideline-Directed Medical Therapy/Evidence Based Medicine  ACE/ARB/ARNI: Sacubitril -valsartan  49-51 mg twice daily Beta Blocker: Carvedilol  6.25 mg twice daily Aldosterone Antagonist:  NA  being holding due concern for not being able to tolerate due to low BP Diuretic: Furosemide  40 mg daily SGLT2i: Empagliflozin 10 mg daily  Adherence Assessment  Do you ever forget to take your medication? [] Yes [x] No  Do you ever skip doses due to side effects? [] Yes [x] No  Do you have trouble affording your medicines? [] Yes [x] No  Are you ever unable to pick up your medication due to transportation difficulties? [] Yes [x] No  Do you ever stop taking your medications because you don't believe they are helping? [] Yes [x] No  Do you check your weight daily? [] Yes [x] No   Barriers to obtaining medications: None identified   Vital signs:  -- BP 136/55, HR 58; weekly 119/69 (report SBP 120s - 130s at home) -- Weight (pounds) 152.2 weight daily: 150 lbs is average  - Echo 02/25/15: EF 60-65% with mild MR, mild LAE - Echo 10/20/20: EF 60-65% with mild LAE, mild MR - Echo 01/23/21: EF 45-50% with mild LVH, severe hypokinesis of left ventricle and trivial MR.  - Echo 03/31/21: EF 60-65% without structural changes.  - Echo 04/19/23: EF 60-65% with mild LVH, Grade II DD, mild LAE, mild MR 09/13/23 GFR > 60      Latest Ref Rng & Units 09/13/2023   11:22 AM 02/18/2023   12:27 PM 10/17/2022   11:55 AM  BMP  Glucose 65 - 99 mg/dL 161  096  045   BUN 7 - 25 mg/dL 14  15  25    Creatinine 0.60 - 0.95 mg/dL 4.09  8.11  9.14   BUN/Creat Ratio 6 - 22 (calc) SEE NOTE:  16    Sodium 135 - 146 mmol/L 140  142  141   Potassium 3.5 - 5.3 mmol/L 3.9  4.3  3.6   Chloride 98 - 110 mmol/L 99  100  101   CO2 20 - 32 mmol/L 35  28  29   Calcium  8.6 - 10.4 mg/dL 9.6  9.4  8.7    Past Medical History:  Diagnosis Date   Acute cystitis     Acute on chronic combined systolic and diastolic CHF (congestive heart failure) (HCC)    Acute respiratory failure with hypoxia (HCC) 01/23/2021   AF (paroxysmal atrial fibrillation) (HCC) 01/23/2021   Allergic rhinitis, cause unspecified    Arthritis 2015   Atherosclerosis of renal artery (HCC)    left   Atrial fibrillation (HCC)    Bronchitis, not specified as acute or chronic    Cancer (HCC) 12/2013   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; left shoulder blade   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   Cataract 2003   Cellulitis and abscess of leg, except foot    Conjunctivitis unspecified    Dermatophytosis of nail    Diabetes mellitus    type II   Elevated troponin    Esophageal reflux    Hyperlipidemia    Hypertension    Osteoporosis 2016   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 They have a history of multiple ablations with the last ablation on 09/25/2022. No significant changes to her medication were made at her last 6 month appointment.   Today she presents with some concerns about her heart going back into afib. Her HR was 58. She denied shortness of breath, dizziness and chest pain. They reported they have some edema. At home report eating pretzels (but scraps the salt off), vegetable spring rolls and ham. Patient reports being compliant with the furosemide  40 mg daily. No reported change in appetite.   Recent ED Visit (past 6 months):  -- Date - 2/4, [facility] - Viral upper respiratory infection -- Date - 3/5, [facility] - Viral upper respiratory infection   PLAN  Prevention -- Crestor  10/d 3/25 LDL 30 (controlled) 3/13 A1c 6.6  Recommendations -- Consider starting spironolactone 12.5 mg daily (K 3.9 on 3/14)  -- Also  could consider adding some lasix  PRN to remove excess fluid when they eat saltier foods -- Consider BMET today   Time spent: 15 minutes  Craven Do, PharmD Pharmacy Resident  10/29/2023 11:00 AM  Current Outpatient Medications:    acetaminophen  (TYLENOL ) 650 MG CR tablet, Take 1,300 mg by mouth at bedtime., Disp: , Rfl:    ALFALFA PO, Take 1 tablet by mouth daily., Disp: , Rfl:    amoxicillin  (AMOXIL ) 500 MG tablet, Take 2,000 mg by mouth See admin instructions. Takes before dental procedures, Disp: , Rfl:    ascorbic Acid  (VITAMIN C ) 500 MG CPCR, Take 500 mg by mouth daily., Disp: , Rfl:    B Complex-C (B-COMPLEX WITH VITAMIN C ) tablet, Take 1 tablet by mouth daily., Disp: , Rfl:    BD PEN NEEDLE NANO U/F 32G X 4 MM MISC, USE 4 TIMES DAILY (HUMALOG  3 TIMES DAILY AND LANTUS  ONCE DAILY) OFFICE NOTIFIED 08/06/17, Disp: 90 each, Rfl: 1   BLACK ELDERBERRY,BERRY-FLOWER, PO, Take 15 mLs by mouth daily. Liquid, Disp: , Rfl:    Calcium  Carbonate (CALCIUM  600 PO), Take 600 mg by mouth daily., Disp: , Rfl:    carvedilol  (COREG ) 6.25 MG tablet, TAKE 1 TABLET BY MOUTH 2 TIMES DAILY WITH A MEAL., Disp: 180 tablet, Rfl: 3   cholecalciferol  (VITAMIN D ) 1000 UNITS tablet, Take 1,000 Units by mouth daily. shaklee, Disp: , Rfl:    Coenzyme Q10 (COQ10) 200 MG CAPS, Take 200 mg by mouth daily., Disp: , Rfl:    Continuous Glucose Sensor (DEXCOM G7 SENSOR) MISC, 1 each by Does not apply route as directed., Disp: , Rfl:    Elastic Bandages & Supports (MEDICAL COMPRESSION STOCKINGS) MISC, 1 each by Does not apply route daily., Disp: 2 each, Rfl: 5   ELIQUIS  5 MG TABS tablet, TAKE 1 TABLET BY MOUTH TWICE A DAY, Disp: 180 tablet, Rfl: 1   empagliflozin (JARDIANCE) 25 MG TABS tablet, Take 25 mg by mouth daily., Disp: , Rfl:    ENTRESTO  49-51 MG, TAKE 1 TABLET BY MOUTH TWICE A DAY, Disp: 180 tablet, Rfl: 3   estradiol  (ESTRACE ) 0.1 MG/GM vaginal cream, APPLY 1/2 GM ONCE WEEKLY USING APPLICATOR, APPLY BLUEBERRY SIZED  AMOUNT OF CREAM USING TIP OF FINGER TO URETHRA TWICE WEEKLY, Disp: 42.5 g, Rfl: 3   furosemide  (LASIX ) 40 MG tablet, TAKE 1 TABLET BY MOUTH TWICE A DAY, Disp: 180 tablet, Rfl: 3   HUMALOG  KWIKPEN 100 UNIT/ML KiwkPen, Inject 2-6 Units into the skin 3 (three) times daily as needed (High blood sugar)., Disp: , Rfl: 3  insulin  glargine (LANTUS ) 100 unit/mL SOPN, Inject 10 Units into the skin in the morning., Disp: , Rfl:    Krill Oil 1000 MG CAPS, Take 1,000 mg by mouth daily., Disp: , Rfl:    Mag Aspart-Potassium Aspart (POTASSIUM & MAGNESIUM  ASPARTAT PO), Take 1 tablet by mouth daily., Disp: , Rfl:    Multiple Vitamins-Minerals (PRESERVISION AREDS 2) CHEW, Chew 1 each by mouth 2 (two) times daily., Disp: , Rfl:    nystatin  cream (MYCOSTATIN ), Apply 1 Application topically 2 (two) times daily., Disp: 30 g, Rfl: 0   OVER THE COUNTER MEDICATION, Place 1 application  into both eyes See admin instructions. Blephadex eyelid foam, apply to eyelids once daily, Disp: , Rfl:    OVER THE COUNTER MEDICATION, Take 1 tablet by mouth daily. Vita-Lea Gold otc supplement With out vitamin K (Shaklee products), Disp: , Rfl:    OVER THE COUNTER MEDICATION, Take 1 tablet by mouth daily. Nutriferon otc supplement, Disp: , Rfl:    Probiotic Product (PROBIOTIC PO), Take 1 capsule by mouth at bedtime., Disp: , Rfl:    PROLIA  60 MG/ML SOSY injection, Inject 60 mg into the skin every 6 (six) months., Disp: , Rfl:    Propylene Glycol (SYSTANE BALANCE OP), Place 1 drop into both eyes daily as needed (dry eyes)., Disp: , Rfl:    rosuvastatin  (CRESTOR ) 10 MG tablet, TAKE 1 TABLET BY MOUTH EVERY DAY, Disp: 90 tablet, Rfl: 0   Sodium Chloride -Sodium Bicarb (NETI POT SINUS WASH NA), Place 1 Dose into the nose at bedtime., Disp: , Rfl:    tirzepatide (MOUNJARO) 10 MG/0.5ML Pen, Inject 10 mg into the skin once a week., Disp: , Rfl:    tiZANidine  (ZANAFLEX ) 2 MG tablet, Take 1-2 tablets (2-4 mg total) by mouth at bedtime., Disp: 180  tablet, Rfl: 1   trimethoprim -polymyxin b  (POLYTRIM ) ophthalmic solution, Place 1 drop into both eyes in the morning, at noon, in the evening, and at bedtime., Disp: 10 mL, Rfl: 0

## 2023-10-29 NOTE — Patient Instructions (Signed)
 Medication Changes:  STOP LASIX   START TORSEMIDE 60 MG ONCE DAILY   Lab Work:  Go over to the [facility]. Go pass the gift shop and have your blood work completed.  We will only call you if the results are abnormal or if the provider would like to make medication changes.   Follow-Up in: 1 MONTH WITH Shawnee Dellen, FNP.  At the [facility], you and your health needs are our priority. We have a designated team specialized in the treatment of Heart Failure. This Care Team includes your primary Heart Failure Specialized Cardiologist (physician), Advanced Practice Providers (APPs- Physician Assistants and Nurse Practitioners), and Pharmacist who all work together to provide you with the care you need, when you need it.   You may see any of the following providers on your designated Care Team at your next follow up:  Dr. Jules Oar Dr. Peder Bourdon Dr. Alwin Baars Dr. Judyth Nunnery Shawnee Dellen, FNP Bevely Brush, RPH-CPP  Please be sure to bring in all your medications bottles to every appointment.   Need to Contact Us :  If you have any questions or concerns before your next appointment please send us  a message through [place] or call our office at [phone].    TO LEAVE A MESSAGE FOR THE NURSE SELECT OPTION 2, PLEASE LEAVE A MESSAGE INCLUDING: YOUR NAME DATE OF BIRTH CALL BACK NUMBER REASON FOR CALL**this is important as we prioritize the call backs  YOU WILL RECEIVE A CALL BACK THE SAME DAY AS LONG AS YOU CALL BEFORE 4:00 PM

## 2023-10-30 LAB — MISC LABCORP TEST (SEND OUT): Labcorp test code: 143000

## 2023-11-02 ENCOUNTER — Other Ambulatory Visit: Payer: Self-pay | Admitting: Family Medicine

## 2023-11-02 DIAGNOSIS — M62830 Muscle spasm of back: Secondary | ICD-10-CM

## 2023-11-04 DIAGNOSIS — M9901 Segmental and somatic dysfunction of cervical region: Secondary | ICD-10-CM | POA: Diagnosis not present

## 2023-11-04 DIAGNOSIS — M9903 Segmental and somatic dysfunction of lumbar region: Secondary | ICD-10-CM | POA: Diagnosis not present

## 2023-11-04 DIAGNOSIS — M5416 Radiculopathy, lumbar region: Secondary | ICD-10-CM | POA: Diagnosis not present

## 2023-11-04 DIAGNOSIS — M5033 Other cervical disc degeneration, cervicothoracic region: Secondary | ICD-10-CM | POA: Diagnosis not present

## 2023-11-18 ENCOUNTER — Telehealth: Payer: Self-pay | Admitting: Family

## 2023-11-18 DIAGNOSIS — M9903 Segmental and somatic dysfunction of lumbar region: Secondary | ICD-10-CM | POA: Diagnosis not present

## 2023-11-18 DIAGNOSIS — M5416 Radiculopathy, lumbar region: Secondary | ICD-10-CM | POA: Diagnosis not present

## 2023-11-18 DIAGNOSIS — M5033 Other cervical disc degeneration, cervicothoracic region: Secondary | ICD-10-CM | POA: Diagnosis not present

## 2023-11-18 DIAGNOSIS — M9901 Segmental and somatic dysfunction of cervical region: Secondary | ICD-10-CM | POA: Diagnosis not present

## 2023-11-19 NOTE — Progress Notes (Signed)
 Called and spoke with pt regarding hear monitor. Notified pt that she is supposed to be getting her heart monitor from [facility] per Shawnee Dellen, FNP. Brian Campanile has already reached out to [facility] regarding this. Pt was given the phone number [phone]) to [facility] to reach out to them herself for follow up on heart monitor placement. Pt had no further questions.

## 2023-11-19 NOTE — Telephone Encounter (Signed)
 Left message for patient with [facility] phone number

## 2023-11-26 ENCOUNTER — Observation Stay (HOSPITAL_COMMUNITY)
Admit: 2023-11-26 | Discharge: 2023-11-26 | Disposition: A | Discharge status: Home / Self Care | Attending: Internal Medicine | Admitting: Internal Medicine

## 2023-11-26 ENCOUNTER — Encounter: Payer: Self-pay | Admitting: Emergency Medicine

## 2023-11-26 ENCOUNTER — Other Ambulatory Visit: Payer: Self-pay

## 2023-11-26 ENCOUNTER — Inpatient Hospital Stay
Admission: EM | Admit: 2023-11-26 | Discharge: 2023-11-28 | DRG: 309 | Disposition: A | Discharge status: Home Health | Attending: Internal Medicine | Admitting: Internal Medicine

## 2023-11-26 DIAGNOSIS — Z841 Family history of disorders of kidney and ureter: Secondary | ICD-10-CM | POA: Diagnosis not present

## 2023-11-26 DIAGNOSIS — I428 Other cardiomyopathies: Secondary | ICD-10-CM | POA: Diagnosis present

## 2023-11-26 DIAGNOSIS — Z85828 Personal history of other malignant neoplasm of skin: Secondary | ICD-10-CM | POA: Diagnosis not present

## 2023-11-26 DIAGNOSIS — I272 Pulmonary hypertension, unspecified: Secondary | ICD-10-CM | POA: Diagnosis present

## 2023-11-26 DIAGNOSIS — I110 Hypertensive heart disease with heart failure: Secondary | ICD-10-CM | POA: Diagnosis not present

## 2023-11-26 DIAGNOSIS — Z800 Family history of malignant neoplasm of digestive organs: Secondary | ICD-10-CM

## 2023-11-26 DIAGNOSIS — I499 Cardiac arrhythmia, unspecified: Secondary | ICD-10-CM | POA: Diagnosis not present

## 2023-11-26 DIAGNOSIS — E785 Hyperlipidemia, unspecified: Secondary | ICD-10-CM | POA: Diagnosis not present

## 2023-11-26 DIAGNOSIS — Z7984 Long term (current) use of oral hypoglycemic drugs: Secondary | ICD-10-CM

## 2023-11-26 DIAGNOSIS — I5042 Chronic combined systolic (congestive) and diastolic (congestive) heart failure: Secondary | ICD-10-CM | POA: Diagnosis present

## 2023-11-26 DIAGNOSIS — I2510 Atherosclerotic heart disease of native coronary artery without angina pectoris: Secondary | ICD-10-CM | POA: Diagnosis not present

## 2023-11-26 DIAGNOSIS — Z91018 Allergy to other foods: Secondary | ICD-10-CM

## 2023-11-26 DIAGNOSIS — I5030 Unspecified diastolic (congestive) heart failure: Secondary | ICD-10-CM

## 2023-11-26 DIAGNOSIS — I5032 Chronic diastolic (congestive) heart failure: Secondary | ICD-10-CM | POA: Diagnosis not present

## 2023-11-26 DIAGNOSIS — I480 Paroxysmal atrial fibrillation: Secondary | ICD-10-CM | POA: Diagnosis present

## 2023-11-26 DIAGNOSIS — Z794 Long term (current) use of insulin: Secondary | ICD-10-CM | POA: Diagnosis not present

## 2023-11-26 DIAGNOSIS — Z7901 Long term (current) use of anticoagulants: Secondary | ICD-10-CM

## 2023-11-26 DIAGNOSIS — R079 Chest pain, unspecified: Secondary | ICD-10-CM | POA: Diagnosis not present

## 2023-11-26 DIAGNOSIS — Z881 Allergy status to other antibiotic agents status: Secondary | ICD-10-CM | POA: Diagnosis not present

## 2023-11-26 DIAGNOSIS — Z96651 Presence of right artificial knee joint: Secondary | ICD-10-CM | POA: Diagnosis not present

## 2023-11-26 DIAGNOSIS — I4819 Other persistent atrial fibrillation: Principal | ICD-10-CM | POA: Diagnosis present

## 2023-11-26 DIAGNOSIS — Z79899 Other long term (current) drug therapy: Secondary | ICD-10-CM | POA: Diagnosis not present

## 2023-11-26 DIAGNOSIS — I959 Hypotension, unspecified: Secondary | ICD-10-CM | POA: Diagnosis not present

## 2023-11-26 DIAGNOSIS — Z803 Family history of malignant neoplasm of breast: Secondary | ICD-10-CM

## 2023-11-26 DIAGNOSIS — Z7985 Long-term (current) use of injectable non-insulin antidiabetic drugs: Secondary | ICD-10-CM

## 2023-11-26 DIAGNOSIS — Z833 Family history of diabetes mellitus: Secondary | ICD-10-CM

## 2023-11-26 DIAGNOSIS — I4891 Unspecified atrial fibrillation: Secondary | ICD-10-CM

## 2023-11-26 DIAGNOSIS — E876 Hypokalemia: Secondary | ICD-10-CM | POA: Diagnosis not present

## 2023-11-26 DIAGNOSIS — Z85820 Personal history of malignant melanoma of skin: Secondary | ICD-10-CM

## 2023-11-26 DIAGNOSIS — E119 Type 2 diabetes mellitus without complications: Secondary | ICD-10-CM | POA: Diagnosis present

## 2023-11-26 DIAGNOSIS — I2489 Other forms of acute ischemic heart disease: Secondary | ICD-10-CM | POA: Diagnosis not present

## 2023-11-26 DIAGNOSIS — Z8249 Family history of ischemic heart disease and other diseases of the circulatory system: Secondary | ICD-10-CM

## 2023-11-26 DIAGNOSIS — R000 Tachycardia, unspecified: Secondary | ICD-10-CM | POA: Diagnosis not present

## 2023-11-26 LAB — COMPREHENSIVE METABOLIC PANEL WITH GFR
ALT: 22 U/L (ref 0–44)
AST: 31 U/L (ref 15–41)
Albumin: 3.7 g/dL (ref 3.5–5.0)
Alkaline Phosphatase: 40 U/L (ref 38–126)
Anion gap: 15 (ref 5–15)
BUN: 17 mg/dL (ref 8–23)
CO2: 25 mmol/L (ref 22–32)
Calcium: 8.9 mg/dL (ref 8.9–10.3)
Chloride: 102 mmol/L (ref 98–111)
Creatinine, Ser: 1.02 mg/dL — ABNORMAL HIGH (ref 0.44–1.00)
GFR, Estimated: 55 mL/min — ABNORMAL LOW (ref >60)
Glucose, Bld: 134 mg/dL — ABNORMAL HIGH (ref 70–99)
Potassium: 3.1 mmol/L — ABNORMAL LOW (ref 3.5–5.1)
Sodium: 142 mmol/L (ref 135–145)
Total Bilirubin: 1 mg/dL (ref 0.0–1.2)
Total Protein: 6 g/dL — ABNORMAL LOW (ref 6.5–8.1)

## 2023-11-26 LAB — CBC WITH DIFFERENTIAL/PLATELET
Abs Immature Granulocytes: 0.01 10*3/uL (ref 0.00–0.07)
Basophils Absolute: 0 10*3/uL (ref 0.0–0.1)
Basophils Relative: 0 %
Eosinophils Absolute: 0.1 10*3/uL (ref 0.0–0.5)
Eosinophils Relative: 1 %
HCT: 38.3 % (ref 36.0–46.0)
Hemoglobin: 12.9 g/dL (ref 12.0–15.0)
Immature Granulocytes: 0 %
Lymphocytes Relative: 22 %
Lymphs Abs: 1.6 10*3/uL (ref 0.7–4.0)
MCH: 32.8 pg (ref 26.0–34.0)
MCHC: 33.7 g/dL (ref 30.0–36.0)
MCV: 97.5 fL (ref 80.0–100.0)
Monocytes Absolute: 0.6 10*3/uL (ref 0.1–1.0)
Monocytes Relative: 8 %
Neutro Abs: 4.7 10*3/uL (ref 1.7–7.7)
Neutrophils Relative %: 69 %
Platelets: 166 10*3/uL (ref 150–400)
RBC: 3.93 MIL/uL (ref 3.87–5.11)
RDW: 12.4 % (ref 11.5–15.5)
WBC: 7 10*3/uL (ref 4.0–10.5)
nRBC: 0 % (ref 0.0–0.2)

## 2023-11-26 LAB — TSH: TSH: 3.181 u[IU]/mL (ref 0.350–4.500)

## 2023-11-26 LAB — ECHOCARDIOGRAM COMPLETE
AR max vel: 1.54 cm2
AV Area VTI: 1.41 cm2
AV Area mean vel: 1.37 cm2
AV Mean grad: 5.7 mmHg
AV Peak grad: 8.6 mmHg
Ao pk vel: 1.47 m/s
Area-P 1/2: 4.41 cm2
Height: 63 in
MV VTI: 1.52 cm2
S' Lateral: 2.4 cm
Weight: 2275.15 [oz_av]

## 2023-11-26 LAB — GLUCOSE, CAPILLARY
Glucose-Capillary: 143 mg/dL — ABNORMAL HIGH (ref 70–99)
Glucose-Capillary: 137 mg/dL — ABNORMAL HIGH (ref 70–99)
Glucose-Capillary: 150 mg/dL — ABNORMAL HIGH (ref 70–99)
Glucose-Capillary: 117 mg/dL — ABNORMAL HIGH (ref 70–99)
Glucose-Capillary: 125 mg/dL — ABNORMAL HIGH (ref 70–99)

## 2023-11-26 LAB — PROTIME-INR
INR: 1.6 — ABNORMAL HIGH (ref 0.8–1.2)
Prothrombin Time: 19.1 s — ABNORMAL HIGH (ref 11.4–15.2)

## 2023-11-26 LAB — TROPONIN I (HIGH SENSITIVITY)
Troponin I (High Sensitivity): 8 ng/L (ref <18)
Troponin I (High Sensitivity): 59 ng/L — ABNORMAL HIGH (ref <18)
Troponin I (High Sensitivity): 145 ng/L (ref <18)
Troponin I (High Sensitivity): 108 ng/L (ref <18)

## 2023-11-26 LAB — MAGNESIUM: Magnesium: 2.2 mg/dL (ref 1.7–2.4)

## 2023-11-26 MED ORDER — VITAMIN C 500 MG PO TABS
500.0000 mg | ORAL_TABLET | Freq: Every day | ORAL | Status: DC
  Administered 2023-11-26 – 2023-11-28 (×3): 500 mg via ORAL
  Filled 2023-11-26 (×3): qty 1

## 2023-11-26 MED ORDER — VITAMIN D (ERGOCALCIFEROL) 1.25 MG (50000 UNIT) PO CAPS: 50000.0000 [IU] | ORAL_CAPSULE | ORAL | Status: DC

## 2023-11-26 MED ORDER — PROSIGHT PO TABS
1.0000 | ORAL_TABLET | Freq: Two times a day (BID) | ORAL | Status: DC
  Administered 2023-11-26 – 2023-11-28 (×5): 1 via ORAL
  Filled 2023-11-26 (×6): qty 1

## 2023-11-26 MED ORDER — POTASSIUM CHLORIDE CRYS ER 20 MEQ PO TBCR
40.0000 meq | EXTENDED_RELEASE_TABLET | Freq: Once | ORAL | Status: AC
  Administered 2023-11-26: 40 meq via ORAL
  Filled 2023-11-26: qty 2

## 2023-11-26 MED ORDER — AMIODARONE HCL IN DEXTROSE 360-4.14 MG/200ML-% IV SOLN
60.0000 mg/h | INTRAVENOUS | Status: DC
  Administered 2023-11-26 (×2): 60 mg/h via INTRAVENOUS
  Filled 2023-11-26 (×2): qty 200

## 2023-11-26 MED ORDER — FENTANYL CITRATE PF 50 MCG/ML IJ SOSY
50.0000 ug | PREFILLED_SYRINGE | Freq: Once | INTRAMUSCULAR | Status: AC
  Administered 2023-11-26: 50 ug via INTRAVENOUS
  Filled 2023-11-26: qty 1

## 2023-11-26 MED ORDER — SODIUM CHLORIDE 0.9 % IV BOLUS
500.0000 mL | Freq: Once | INTRAVENOUS | Status: AC
  Administered 2023-11-26: 500 mL via INTRAVENOUS

## 2023-11-26 MED ORDER — INSULIN GLARGINE-YFGN 100 UNIT/ML ~~LOC~~ SOLN
13.0000 [IU] | Freq: Every day | SUBCUTANEOUS | Status: DC
  Administered 2023-11-26: 13 [IU] via SUBCUTANEOUS
  Filled 2023-11-26 (×3): qty 0.13

## 2023-11-26 MED ORDER — AMIODARONE LOAD VIA INFUSION
150.0000 mg | Freq: Once | INTRAVENOUS | Status: AC
  Administered 2023-11-26: 150 mg via INTRAVENOUS
  Filled 2023-11-26: qty 83.34

## 2023-11-26 MED ORDER — APIXABAN 5 MG PO TABS
5.0000 mg | ORAL_TABLET | Freq: Two times a day (BID) | ORAL | Status: DC
  Administered 2023-11-26 – 2023-11-28 (×5): 5 mg via ORAL
  Filled 2023-11-26 (×5): qty 1

## 2023-11-26 MED ORDER — VITAMIN D 25 MCG (1000 UNIT) PO TABS
1000.0000 [IU] | ORAL_TABLET | Freq: Every day | ORAL | Status: DC
  Administered 2023-11-26 – 2023-11-28 (×3): 1000 [IU] via ORAL
  Filled 2023-11-26 (×3): qty 1

## 2023-11-26 MED ORDER — ONDANSETRON HCL 4 MG/2ML IJ SOLN: 4.0000 mg | Freq: Four times a day (QID) | INTRAMUSCULAR | Status: DC | PRN

## 2023-11-26 MED ORDER — ONDANSETRON HCL 4 MG/2ML IJ SOLN
4.0000 mg | INTRAMUSCULAR | Status: AC
  Administered 2023-11-26: 4 mg via INTRAVENOUS
  Filled 2023-11-26: qty 2

## 2023-11-26 MED ORDER — INSULIN ASPART 100 UNIT/ML IJ SOLN
0.0000 [IU] | Freq: Three times a day (TID) | INTRAMUSCULAR | Status: DC
  Administered 2023-11-26: 2 [IU] via SUBCUTANEOUS
  Filled 2023-11-26: qty 1

## 2023-11-26 MED ORDER — INSULIN ASPART 100 UNIT/ML IJ SOLN: 0.0000 [IU] | INTRAMUSCULAR | Status: DC

## 2023-11-26 MED ORDER — ETOMIDATE 2 MG/ML IV SOLN
8.0000 mg | Freq: Once | INTRAVENOUS | Status: AC
  Administered 2023-11-26: 8 mg via INTRAVENOUS
  Filled 2023-11-26: qty 10

## 2023-11-26 MED ORDER — POTASSIUM CHLORIDE 10 MEQ/100ML IV SOLN
10.0000 meq | Freq: Once | INTRAVENOUS | Status: AC
  Administered 2023-11-26: 10 meq via INTRAVENOUS
  Filled 2023-11-26: qty 100

## 2023-11-26 MED ORDER — EMPAGLIFLOZIN 25 MG PO TABS
25.0000 mg | ORAL_TABLET | Freq: Every day | ORAL | Status: DC
  Administered 2023-11-26 – 2023-11-28 (×3): 25 mg via ORAL
  Filled 2023-11-26 (×5): qty 1

## 2023-11-26 MED ORDER — AMIODARONE HCL IN DEXTROSE 360-4.14 MG/200ML-% IV SOLN
30.0000 mg/h | INTRAVENOUS | Status: DC
  Administered 2023-11-26 – 2023-11-27 (×3): 30 mg/h via INTRAVENOUS
  Filled 2023-11-26 (×2): qty 200

## 2023-11-26 MED ORDER — ROSUVASTATIN CALCIUM 10 MG PO TABS
10.0000 mg | ORAL_TABLET | Freq: Every day | ORAL | Status: DC
  Administered 2023-11-26 – 2023-11-27 (×2): 10 mg via ORAL
  Filled 2023-11-26 (×2): qty 1

## 2023-11-26 MED ORDER — ACETAMINOPHEN 325 MG PO TABS
650.0000 mg | ORAL_TABLET | ORAL | Status: DC | PRN
  Administered 2023-11-28: 650 mg via ORAL
  Filled 2023-11-26: qty 2

## 2023-11-26 MED ORDER — ALPRAZOLAM 0.25 MG PO TABS
0.2500 mg | ORAL_TABLET | Freq: Two times a day (BID) | ORAL | Status: DC | PRN
  Administered 2023-11-28: 0.25 mg via ORAL
  Filled 2023-11-26: qty 1

## 2023-11-26 NOTE — Sedation Documentation (Signed)
 Patient shocked at this time by MD 200 J

## 2023-11-26 NOTE — Progress Notes (Signed)
*  PRELIMINARY RESULTS* Echocardiogram 2D Echocardiogram has been performed.  Bonnie Rowland 11/26/2023, 10:16 AM

## 2023-11-26 NOTE — Assessment & Plan Note (Signed)
 Received repletion in the ED Continue to monitor and correct as needed

## 2023-11-26 NOTE — Assessment & Plan Note (Addendum)
 Nonischemic cardiomyopathy History of Takotsubo cardiomyopathy EF 60 to 65% last echo Clinically euvolemic Holding GDMT with Entresto , carvedilol  and continue torsemide  due to hypotension

## 2023-11-26 NOTE — Assessment & Plan Note (Addendum)
 History of multiple DC cardioversions History of successful ablation 08/2022 -Unsuccessful DCCV x 2 in the ED -Continue amiodarone  infusion started in the ED -Continue Eliquis  - Cardiology consult Will keep n.p.o. overnight in case of procedure Addendum: Converted to sinus in the 70s to 80s with amiodarone 

## 2023-11-26 NOTE — ED Provider Notes (Signed)
 [facility] Provider Note    Event Date/Time   First MD Initiated Contact with Patient 11/26/23 0038     (approximate)   History   Chest Pain   HPI Bonnie Rowland is a 83 y.o. female with a history of atrial fibrillation status post multiple cardioversions and ablation by Dr. Marven Slimmer in March 2024.  She sees Dr. Gollan as her primary cardiologist also sees Shawnee Dellen in the heart failure center.  She has been doing well since her ablation and is diligent about taking her daily Eliquis  and carvedilol .  She presents tonight after relatively acute onset chest pressure and rapid heart rate around 11 PM tonight.  She was not doing anything in particular.  She says the pressure radiates into her jaw and her neck.  She is not having any trouble breathing.  She has no numbness or weakness in any of her extremities.  It feels similar to prior episodes of A-fib.  She is fully awake and alert and oriented, conversant and in good spirits despite her symptoms.  No recent traumas.     Physical Exam   Triage Vital Signs: ED Triage Vitals  Encounter Vitals Group     BP 11/26/23 0040 (!) 96/53     Systolic BP Percentile --      Diastolic BP Percentile --      Pulse Rate 11/26/23 0040 (!) 152     Resp 11/26/23 0040 20     Temp 11/26/23 0041 98.3 F (36.8 C)     Temp Source 11/26/23 0041 Oral     SpO2 11/26/23 0040 100 %     Weight 11/26/23 0041 68.9 kg (152 lb)     Height 11/26/23 0041 1.6 m (5\' 3" )     Head Circumference --      Peak Flow --      Pain Score 11/26/23 0041 3     Pain Loc --      Pain Education --      Exclude from Growth Chart --     Most recent vital signs: Vitals:   11/26/23 0215 11/26/23 0230  BP: (!) 100/54 (!) 106/51  Pulse: (!) 111 (!) 115  Resp: 15 (!) 21  Temp:    SpO2: 97% 97%    General: Awake, no distress.  Generally well-appearing despite symptoms. CV:  Good peripheral perfusion.  Irregularly irregular rhythm with  tachycardia ranging from about 140 to the mid-160s. Resp:  Normal effort. Speaking easily and comfortably, no accessory muscle usage nor intercostal retractions.   Abd:  No distention.    ED Results / Procedures / Treatments   Labs (all labs ordered are listed, but only abnormal results are displayed) Labs Reviewed  COMPREHENSIVE METABOLIC PANEL WITH GFR - Abnormal; Notable for the following components:      Result Value   Potassium 3.1 (*)    Glucose, Bld 134 (*)    Creatinine, Ser 1.02 (*)    Total Protein 6.0 (*)    GFR, Estimated 55 (*)    All other components within normal limits  PROTIME-INR - Abnormal; Notable for the following components:   Prothrombin Time 19.1 (*)    INR 1.6 (*)    All other components within normal limits  CBC WITH DIFFERENTIAL/PLATELET  MAGNESIUM   TROPONIN I (HIGH SENSITIVITY)  TROPONIN I (HIGH SENSITIVITY)     EKG  ED ECG REPORT I, Lynnda Sas, the attending physician, personally viewed and interpreted this ECG.  Date:  11/26/2023 EKG Time: 00: 44 Rate: 149 Rhythm: Atrial fibrillation with RVR QRS Axis: normal Intervals: Abnormal due to A-fib ST/T Wave abnormalities: Non-specific ST segment / T-wave changes, but no clear evidence of acute ischemia. Narrative Interpretation: no definitive evidence of acute ischemia; does not meet STEMI criteria.      PROCEDURES:  Critical Care performed: Yes, see critical care procedure note(s)  .Critical Care  Performed by: Lynnda Sas, MD Authorized by: Lynnda Sas, MD   Critical care provider statement:    Critical care time (minutes):  45   Critical care time was exclusive of:  Separately billable procedures and treating other patients   Critical care was necessary to treat or prevent imminent or life-threatening deterioration of the following conditions:  Cardiac failure   Critical care was time spent personally by me on the following activities:  Development of treatment plan with  patient or surrogate, evaluation of patient's response to treatment, examination of patient, obtaining history from patient or surrogate, ordering and performing treatments and interventions, ordering and review of laboratory studies, ordering and review of radiographic studies, pulse oximetry, re-evaluation of patient's condition and review of old charts .1-3 Lead EKG Interpretation  Performed by: Lynnda Sas, MD Authorized by: Lynnda Sas, MD     Interpretation: abnormal     ECG rate:  145   ECG rate assessment: tachycardic     Rhythm: atrial fibrillation     Ectopy: none     Conduction: normal   .Cardioversion  Date/Time: 11/26/2023 1:33 AM  Performed by: Lynnda Sas, MD Authorized by: Lynnda Sas, MD   Consent:    Consent obtained:  Verbal and written   Consent given by:  Patient   Risks discussed:  Death, induced arrhythmia and pain   Alternatives discussed:  Rate-control medication Pre-procedure details:    Cardioversion basis:  Emergent   Rhythm:  Atrial fibrillation   Electrode placement:  Anterior-posterior Patient sedated: Yes. Refer to sedation procedure documentation for details of sedation.  Attempt one:    Cardioversion mode:  Synchronous   Waveform:  Biphasic   Shock (Joules):  200   Shock outcome:  No change in rhythm Attempt two:    Cardioversion mode:  Synchronous   Waveform:  Biphasic   Shock (Joules):  200   Shock outcome:  No change in rhythm Post-procedure details:    Patient status:  Awake   Patient tolerance of procedure:  Tolerated well, no immediate complications Comments:     Unsuccessful cardioversion  .Sedation  Date/Time: 11/26/2023 1:33 AM  Performed by: Lynnda Sas, MD Authorized by: Lynnda Sas, MD   Consent:    Consent obtained:  Verbal and written   Consent given by:  Patient   Risks discussed:  Prolonged hypoxia resulting in organ damage, prolonged sedation necessitating reversal, vomiting, nausea, dysrhythmia,  inadequate sedation and respiratory compromise necessitating ventilatory assistance and intubation Universal protocol:    Immediately prior to procedure, a time out was called: yes     Patient identity confirmed:  Arm band and verbally with patient Indications:    Procedure performed:  Cardioversion   Procedure necessitating sedation performed by:  Physician performing sedation Pre-sedation assessment:    Time since last food or drink:  About 5 hours   ASA classification: class 3 - patient with severe systemic disease     Mallampati score:  II - soft palate, uvula, fauces visible   Neck mobility: normal     Pre-sedation assessments completed and reviewed: airway patency, cardiovascular  function, hydration status, mental status, nausea/vomiting, pain level, respiratory function and temperature   A pre-sedation assessment was completed prior to the start of the procedure Immediate pre-procedure details:    Reassessment: Patient reassessed immediately prior to procedure     Reviewed: vital signs, relevant labs/tests and NPO status     Verified: bag valve mask available, emergency equipment available, intubation equipment available, IV patency confirmed, oxygen available, reversal medications available and suction available   Procedure details (see MAR for exact dosages):    Preoxygenation:  Nasal cannula   Sedation:  Etomidate    Intended level of sedation: deep   Analgesia:  Fentanyl    Intra-procedure monitoring:  Blood pressure monitoring, cardiac monitor, continuous capnometry, continuous pulse oximetry, frequent LOC assessments and frequent vital sign checks   Intra-procedure events: none     Total Provider sedation time (minutes):  12 Post-procedure details:   A post-sedation assessment was completed following the completion of the procedure.   Attendance: Constant attendance by certified staff until patient recovered     Recovery: Patient returned to pre-procedure baseline      Post-sedation assessments completed and reviewed: airway patency, cardiovascular function, hydration status, mental status, nausea/vomiting, pain level, respiratory function and temperature     Patient is stable for discharge or admission: yes     Procedure completion:  Tolerated well, no immediate complications     IMPRESSION / MDM / ASSESSMENT AND PLAN / ED COURSE  I reviewed the triage vital signs and the nursing notes.                              Differential diagnosis includes, but is not limited to, A-fib with RVR, ACS, metabolic or electrolyte abnormality.  Patient's presentation is most consistent with acute presentation with potential threat to life or bodily function.  Labs/studies ordered: High-sensitivity troponin, magnesium  level, pro time-INR, CMP, CBC with differential  Interventions/Medications given:  Medications  amiodarone  (NEXTERONE ) 1.8 mg/mL load via infusion 150 mg (150 mg Intravenous Bolus from Bag 11/26/23 0225)    Followed by  amiodarone  (NEXTERONE  PREMIX) 360-4.14 MG/200ML-% (1.8 mg/mL) IV infusion (60 mg/hr Intravenous New Bag/Given 11/26/23 0224)    Followed by  amiodarone  (NEXTERONE  PREMIX) 360-4.14 MG/200ML-% (1.8 mg/mL) IV infusion (has no administration in time range)  potassium chloride  10 mEq in 100 mL IVPB (10 mEq Intravenous New Bag/Given 11/26/23 0221)  ondansetron  (ZOFRAN ) injection 4 mg (4 mg Intravenous Given 11/26/23 0131)  fentaNYL  (SUBLIMAZE ) injection 50 mcg (50 mcg Intravenous Given 11/26/23 0131)  etomidate  (AMIDATE ) injection 8 mg (8 mg Intravenous Given 11/26/23 0135)  potassium chloride  SA (KLOR-CON  M) CR tablet 40 mEq (40 mEq Oral Given 11/26/23 0222)    (Note:  hospital course my include additional interventions and/or labs/studies not listed above.)   Patient has a history of paroxysmal atrial fibrillation.  I read prior clinic notes by Shawnee Dellen as well as by Georgianne Kirsten with cardiology confirming her history of CHF with  preserved ejection fraction, A-fib, cardioversions, ablation, etc.  The patient is hypotensive with a systolic ranging between 83 and 90, giving her a MAP of around 65.  However, she is fully awake and alert.  She is not technically unstable requiring emergent cardioversion, but I think she would be a good candidate for cardioversion given her hypotension (thus making her a poor candidate for diltiazem bolus).  She is adamant that she is compliant with her Eliquis  and has missed no  doses, and that she also only had onset of symptoms a few hours ago at 11 PM.  Given her history and age, I will consult with the on-call cardiologist to discuss the case, but if he agrees it is reasonable plan I will discuss ED cardioversion with her and possibly avoid unnecessary hospitalization.  The patient is on the cardiac monitor to evaluate for evidence of arrhythmia and/or significant heart rate changes.   Clinical Course as of 11/26/23 0249  Tue Nov 26, 2023  0056 Paged Dr. Rolm Clos with [facility] to discuss [CF]  0114 Consulted Dr. Lucy Sack with cardiology by phone.  We discussed the case in detail and he agreed this patient would be an excellent candidate for ED cardioversion.  He feels that if the patient remains stable after successful cardioversion, she could potentially be discharged home for close outpatient follow-up.  If the cardioversion is unstable or if she chooses not to proceed, given her low blood pressure she would benefit most from an amiodarone  infusion and admission.  However, I had my risk/benefit discussion with the patient, and she very much wants to proceed with cardioversion.  She understands potential risks and signed the consent form.  We will proceed with sedation and electrical cardioversion once we are all set up. [CF]  0149 After 2 attempts at synchronized cardioversion, unfortunately we were not successful converting her to normal sinus rhythm.  Patient is awake and alert,  somnolent but essentially back to baseline.  I updated her and her husband that the plan now is to start her on amiodarone  and admit her to the hospitalist service for cardiology consultation as per the recommendations from Dr. Rolm Clos.  They understand and agree with the plan. [CF]  0151 Patient's initial high-sensitivity troponin is normal.  Her metabolic panel is essentially normal other than mild hypokalemia, for which I ordered 40 mill equivalents potassium by mouth and 10 mill equivalents by IV. [CF]  0154 Consulting hospitalist for admission [CF]  0225 Consulted Dr. Vallarie Gauze with the hospitalist service who will admit the patient [CF]    Clinical Course User Index [CF] Lynnda Sas, MD     FINAL CLINICAL IMPRESSION(S) / ED DIAGNOSES   Final diagnoses:  Atrial fibrillation with RVR (HCC)  Current use of long term anticoagulation     Rx / DC Orders   ED Discharge Orders     None        Note:  This document was prepared using Dragon voice recognition software and may include unintentional dictation errors.   Lynnda Sas, MD 11/26/23 [phone]

## 2023-11-26 NOTE — Assessment & Plan Note (Addendum)
 Had mild chest pressure on arrival, likely tachycardica induced Received aspirin  324 with EMS First troponin negative, continue to trend Continue apixaban , rosuvastatin .  Resume carvedilol  when BP has stabilized

## 2023-11-26 NOTE — Assessment & Plan Note (Signed)
 Still with soft blood pressure IV fluid bolus Hold BP lowering meds of carvedilol  and Entresto  until stabilized

## 2023-11-26 NOTE — ED Triage Notes (Signed)
 Patient BIB EMS for evaluation of chest pressure.  Reports symptoms started around 11 PM and have not improved.  Hx of AFIB.  Had ablation and has been "AFIB free since January."  C/o pain radiating into jaw and neck.  Given ASA 324 mg PO prior to arrival.  Was found to have "low BP" by medic at [facility].  Pt is alert and oriented.

## 2023-11-26 NOTE — Progress Notes (Signed)
 Progress Note    Bonnie Rowland  ZOX:[id number] DOB: [birth year]  DOA: 11/26/2023 PCP: Sowles, Krichna, MD      Brief Narrative:    Medical records reviewed and are as summarized below:  Bonnie Rowland is a 83 y.o. female  with medical history significant for DM, HTN, nonobstructive CAD, HFpEF, NICM, persistent A. fib with several prior cardioversions and subsequent successful ablation 08/2022, currently on Eliquis , last seen by her cardiologist 10/28/2023.  She presented to the hospital because of chest pain/left-sided pressure that radiates into the jaw and neck.  Paramedics noted that her blood pressure was low when they picked her up at [facility] independent living facility.  In the ED, she was found to have atrial fibrillation with RVR and hypotension.  She was cardioverted twice but still remained in A-fib.  She was subsequently started on IV amiodarone  infusion.       Assessment/Plan:   Principal Problem:   Atrial fibrillation with rapid ventricular response (HCC) Active Problems:   Hypotension   (HFpEF) heart failure with preserved ejection fraction (HCC)   Nonobstructive coronary artery disease   Hypokalemia   DM (diabetes mellitus) (HCC)   Non-ischemic cardiomyopathy (HCC)     Body mass index is 25.19 kg/m.    Persistent A-fib with RVR with history of ablation in March 2024: S/p unsuccessful cardioversion x 2 in the ED.  She was started on IV amiodarone  drip and she has converted to normal sinus rhythm. She is still on IV amiodarone .  Carvedilol  on hold.  Continue Eliquis . Follow-up with cardiologist for further recommendations.   Hypotension: Improved.  Entresto , torsemide  and carvedilol  on hold.   Chronic HFpEF: Repeat 2D echo pending. 2D echo in October 2024 showed EF estimated at 6065%, mild LVH, grade 2 diastolic dysfunction.   Hypokalemia: Replete potassium and monitor levels.   Elevated troponin: Troponin up from 8-59.  Probably  from demand ischemia.  Repeat troponin pending. Nonobstructive CAD: Cardiac cath in July 2022 showed mild to moderate nonobstructive CAD.   Type II DM: Continue Jardiance, Lantus  and NovoLog  as needed.  Diet Order             Diet NPO time specified Except for: Sips with Meds  Diet effective midnight           Diet heart healthy/carb modified Fluid consistency: Thin  Diet effective now                            Consultants: Cardiologist  Procedures: None    Medications:    apixaban   5 mg Oral BID   ascorbic acid   500 mg Oral Daily   cholecalciferol   1,000 Units Oral Daily   empagliflozin  25 mg Oral Q breakfast   insulin  aspart  0-15 Units Subcutaneous TID WC   insulin  aspart  0-5 Units Subcutaneous Q4H   insulin  glargine-yfgn  13 Units Subcutaneous Daily   multivitamin  1 tablet Oral BID   rosuvastatin   10 mg Oral q1800   [START ON 11/30/2023] Vitamin D  (Ergocalciferol )  50,000 Units Oral Weekly   Continuous Infusions:  amiodarone  30 mg/hr (11/26/23 0819)     Anti-infectives (From admission, onward)    None              Family Communication/Anticipated D/C date and plan/Code Status   DVT prophylaxis:  apixaban  (ELIQUIS ) tablet 5 mg     Code Status: Full Code  Family Communication: None Disposition Plan: Plan to discharge home   Status is: Observation The patient will require care spanning > 2 midnights and should be moved to inpatient because: Persistent A-fib with RVR on amiodarone  drip       Subjective:   Interval events noted.  No complaints.  No palpitations, dizziness, chest pain or shortness of breath.  She feels better.  She can feel that her heart rhythm is normal.  Objective:    Vitals:   11/26/23 0404 11/26/23 0500 11/26/23 0600 11/26/23 0723  BP: (!) 103/47 (!) 100/49 (!) 92/47   Pulse: 81 78 68   Resp: 16 18 17    Temp: 98.4 F (36.9 C) 97.8 F (36.6 C) (!) 97.5 F (36.4 C) (!) 97.5 F (36.4 C)   TempSrc: Oral Oral Oral Oral  SpO2: 100% 96% 96%   Weight: 64.5 kg     Height:       No data found.   Intake/Output Summary (Last 24 hours) at 11/26/2023 1052 Last data filed at 11/26/2023 8119 Gross per 24 hour  Intake 897.56 ml  Output 0 ml  Net 897.56 ml   Filed Weights   11/26/23 0041 11/26/23 0404  Weight: 68.9 kg 64.5 kg    Exam:  GEN: NAD SKIN: Warm and dry EYES: No pallor or icterus ENT: MMM CV: RRR PULM: CTA B ABD: soft, ND, NT, +BS CNS: AAO x 3, non focal EXT: No edema or tenderness        Data Reviewed:   I have personally reviewed following labs and imaging studies:  Labs: Labs show the following:   Basic Metabolic Panel: Recent Labs  Lab 11/26/23 0111  NA 142  K 3.1*  CL 102  CO2 25  GLUCOSE 134*  BUN 17  CREATININE 1.02*  CALCIUM  8.9  MG 2.2   GFR Estimated Creatinine Clearance: 39.1 mL/min (A) (by C-G formula based on SCr of 1.02 mg/dL (H)). Liver Function Tests: Recent Labs  Lab 11/26/23 0111  AST 31  ALT 22  ALKPHOS 40  BILITOT 1.0  PROT 6.0*  ALBUMIN 3.7   No results for input(s): "LIPASE", "AMYLASE" in the last 168 hours. No results for input(s): "AMMONIA" in the last 168 hours. Coagulation profile Recent Labs  Lab 11/26/23 0111  INR 1.6*    CBC: Recent Labs  Lab 11/26/23 0111  WBC 7.0  NEUTROABS 4.7  HGB 12.9  HCT 38.3  MCV 97.5  PLT 166   Cardiac Enzymes: No results for input(s): "CKTOTAL", "CKMB", "CKMBINDEX", "TROPONINI" in the last 168 hours. BNP (last 3 results) No results for input(s): "PROBNP" in the last 8760 hours. CBG: Recent Labs  Lab 11/26/23 0351 11/26/23 0724  GLUCAP 143* 137*   D-Dimer: No results for input(s): "DDIMER" in the last 72 hours. Hgb A1c: No results for input(s): "HGBA1C" in the last 72 hours. Lipid Profile: No results for input(s): "CHOL", "HDL", "LDLCALC", "TRIG", "CHOLHDL", "LDLDIRECT" in the last 72 hours. Thyroid  function studies: Recent Labs     11/26/23 0419  TSH 3.181   Anemia work up: No results for input(s): "VITAMINB12", "FOLATE", "FERRITIN", "TIBC", "IRON", "RETICCTPCT" in the last 72 hours. Sepsis Labs: Recent Labs  Lab 11/26/23 0111  WBC 7.0    Microbiology No results found for this or any previous visit (from the past 240 hours).  Procedures and diagnostic studies:  No results found.             LOS: 0 days   Nekesha Font  [employer] loss adjuster on [email or web address]. If 7PM-7AM, please contact night-coverage at [email or web address]     11/26/2023, 10:52 AM

## 2023-11-26 NOTE — Consult Note (Signed)
 Cardiology Consultation   Patient ID: Bonnie Rowland MRN: [id number]; DOB: 1941-02-11  Admit date: 11/26/2023 Date of Consult: 11/26/2023  PCP:  Bonnie Lacer, MD   [facility] Providers Cardiologist:  Belva Boyden, MD  Electrophysiologist:  Boyce Byes, MD       Patient Profile:   Bonnie Rowland is a 83 y.o. female with a hx of nonobstrucive CAD by [facility] 12/2020, chronic combined systolic and diastolic CHF, NICM with subsequent normalization of LV systolic function, persistent atrial fibrillation s/p repeat DCCV s/p repeat DCCV ablation 08/2022, left renal artery stenosis < 50%, bilateral ICA carotid stenosis 1 to 39%, type 2 diabetes, hypertension, hyperlipidemia, and obesity who is being seen 11/26/2023 for the evaluation of atrial fibrillation with RVR at the request of Dr Leory Rands.  History of Present Illness:    [name] underwent LHC in 09/2005 which showed normal coronary arteries.  She was diagnosed with atrial flutter with RVR 01/2015 after presenting for a total right knee replacement with spontaneous conversion to sinus rhythm.  She was maintained on flecainide  briefly.  She was seen 08/2020 and noted to be in asymptomatic A-fib leading to the addition of apixaban  at that time.  Echo 09/2020 demonstrated EF 60 to 65%.  She underwent DCCV on 11/2020 with conversion to sinus rhythm.  She was seen in follow-up later that month was noted to be back in atrial fibrillation with controlled ventricular response.  In this setting, she was loaded with amiodarone  and underwent repeat DCCV on 12/2020.  She was admitted later in the month with respiratory failure pulmonary edema in the setting of new LV dysfunction and recurrent A-fib.  Echo showed EF 45 to 50%.  R/LHC showed mild to moderate nonobstructive CAD including 30% mid LAD stenosis, 50 to 60% mid to distal LCx stenosis, and 60% proximal to mid RCA stenosis.  There was mild to moderately reduced LV systolic function  with mid anterior hypo-/akinesis with question of possible atypical Takotsubo variant with an LVEF of approximately 45%.  Moderately elevated right heart filling and pulmonary artery pressures with significant pulmonary vascular resistance and mildly reduced cardiac output and index.  She was diuresed, placed on tolerated GDMT, and underwent successful repeat DCCV.  Limited echo 03/2021 showed EF 60 to 65%.  She had recurrent A-fib and the fall 2023 with unsuccessful DCCV in the ED x 2.  She was subsequently evaluated by EP and underwent successful A-fib/flutter ablation on 08/2022.  Seen in follow-up 12/2022 and was maintaining sinus rhythm, amiodarone  was discontinued.  She has been followed by the [facility] CHF clinic.  Echo 04/2023 showed EF of 60 to 65%, no RWMA, normal RV systolic function and size, G2 DD, mildly dilated left atrium and degenerative mitral valve with mild regurg.  Patient was most recently seen by EP 07/2023 and was doing well from a cardiac perspective without known recurrence of A-fib.  There was consideration for Kardia mobile although patient wanted to think about it.    Patient reports that late yesterday evening 5/26 around 11 PM she started to experience rapid heart rate with associated symptoms that she describes as pressure in her head radiating down to her neck, chest, and bilateral upper extremities.  She does note some associated lightheadedness.  She denies shortness of breath, diaphoresis, nausea, lower extremity swelling, orthopnea, PND.  Prior to this episode she was in her usual state of health without symptoms of exertional angina and cardiac decompensation.  She had been following  with the [facility] CHF clinic for intermittent lower extremity edema.  She was recently switched from furosemide  to torsemide  which has been helping with the swelling.  She is compliant with her medications and denies missing any doses of Eliquis .  In the ED, heart rate elevated at 152 bpm with  soft blood pressure 96/56 and otherwise normal vital signs.  Labs notable for potassium 3.1, creatinine 1.02.  Magnesium  2.2.  CBC within normal limits.  Troponin 8 > 59.  She was noted to be in atrial fibrillation with RVR.  She underwent unsuccessful DCCV x 2 and was subsequently placed on IV amiodarone .  She received 10 mEq of IV potassium and 40 mEq of oral potassium.  Cardiology was asked to consult for further evaluation and management of A-fib RVR.  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; left shoulder blade   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   Cataract 2003   Cellulitis and abscess of leg, except foot    Conjunctivitis unspecified    Dermatophytosis of nail    Diabetes mellitus    type II   Elevated troponin    Esophageal reflux    Hyperlipidemia    Hypertension    Osteoporosis 2016   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)    Renal artery stenosis (HCC)    Sprain of lumbar region    Thoracic or lumbosacral neuritis or radiculitis, unspecified    Urinary tract infection, site not specified     Past Surgical History:  Procedure Laterality Date   APPENDECTOMY     ATRIAL FIBRILLATION ABLATION N/A 09/25/2022   Procedure: ATRIAL FIBRILLATION ABLATION;  Surgeon: Boyce Byes, MD;  Location: [facility] CV [facility];  Service: Cardiovascular;  Laterality: N/A;   BIOPSY  02/28/2023   Procedure: BIOPSY;  Surgeon: Quintin Buckle, DO;  Location: [facility] ENDOSCOPY;  Service: Gastroenterology;;   CARDIAC CATHETERIZATION      CARDIOVERSION N/A 11/30/2020   Procedure: CARDIOVERSION;  Surgeon: Devorah Fonder, MD;  Location: [facility] ORS;  Service: Cardiovascular;  Laterality: N/A;   CARDIOVERSION N/A 01/20/2021   Procedure: CARDIOVERSION;  Surgeon: Devorah Fonder, MD;  Location: [facility] ORS;  Service: Cardiovascular;  Laterality: N/A;   CARDIOVERSION N/A 01/30/2021   Procedure: CARDIOVERSION;  Surgeon: Wenona Hamilton, MD;  Location: [facility] ORS;  Service: Cardiovascular;  Laterality: N/A;   cataract surgery     COLONOSCOPY     COLONOSCOPY N/A 02/28/2023   Procedure: COLONOSCOPY;  Surgeon: Quintin Buckle, DO;  Location: [facility] ENDOSCOPY;  Service: Gastroenterology;  Laterality: N/A;   COLONOSCOPY WITH PROPOFOL  N/A 05/09/2016   Procedure: COLONOSCOPY WITH PROPOFOL ;  Surgeon: Marshall Skeeter, MD;  Location: [facility] ENDOSCOPY;  Service: Endoscopy;  Laterality: N/A;   DIAGNOSTIC MAMMOGRAM     EYE SURGERY Bilateral    Cataract Extraction   JOINT REPLACEMENT  2016   KNEE ARTHROPLASTY Right 03/30/2015   Procedure: COMPUTER ASSISTED TOTAL KNEE ARTHROPLASTY;  Surgeon: Arlyne Lame, MD;  Location: [facility] ORS;  Service:  Orthopedics;  Laterality: Right;   KNEE ARTHROSCOPY Right    KNEE CLOSED REDUCTION Right 05/23/2015   Procedure: CLOSED MANIPULATION KNEE;  Surgeon: Arlyne Lame, MD;  Location: [facility] ORS;  Service: Orthopedics;  Laterality: Right;   POLYPECTOMY  02/28/2023   Procedure: POLYPECTOMY;  Surgeon: Quintin Buckle, DO;  Location: [facility] ENDOSCOPY;  Service: Gastroenterology;;   RIGHT/LEFT HEART CATH AND CORONARY ANGIOGRAPHY N/A 01/25/2021   Procedure: RIGHT/LEFT HEART CATH AND CORONARY ANGIOGRAPHY;  Surgeon: Sammy Crisp, MD;  Location: [facility] INVASIVE CV LAB;  Service: Cardiovascular;  Laterality: N/A;   TONSILLECTOMY         Inpatient Medications: Scheduled Meds:  apixaban   5 mg Oral BID   ascorbic acid   500 mg Oral Daily   cholecalciferol   1,000 Units Oral Daily   empagliflozin  25 mg Oral Q breakfast    insulin  aspart  0-15 Units Subcutaneous TID WC   insulin  aspart  0-5 Units Subcutaneous Q4H   insulin  glargine-yfgn  13 Units Subcutaneous Daily   multivitamin  1 tablet Oral BID   rosuvastatin   10 mg Oral q1800   [START ON 11/30/2023] Vitamin D  (Ergocalciferol )  50,000 Units Oral Weekly   Continuous Infusions:  amiodarone  30 mg/hr (11/26/23 0819)   PRN Meds: acetaminophen , ALPRAZolam, ondansetron  (ZOFRAN ) IV  Allergies:    Allergies  Allergen Reactions   Flexeril  [Cyclobenzaprine ] Hypertension   Cheese Other (See Comments)    bloating   Ciprofloxacin Hcl Other (See Comments)    Muscle pain   Coconut (Cocos Nucifera) Other (See Comments)    Upset stomach   Keflex [Cephalexin] Other (See Comments)    Patient prefers not to take this medication due to the side effects, caused tendon issues    Social History:   Social History   Socioeconomic History   Marital status: Married    Spouse name: Camera operator   Number of children: 1   Years of education: Not on file   Highest education level: Master's degree (e.g., MA, MS, MEng, MEd, MSW, MBA)  Occupational History   Occupation: Retired  Tobacco Use   Smoking status: Never    Passive exposure: Never   Smokeless tobacco: Never   Tobacco comments:    Never smoke 10/23/22  Vaping Use   Vaping status: Never Used  Substance and Sexual Activity   Alcohol  use: Not Currently    Alcohol /week: 1.0 standard drink of alcohol     Types: 1 Glasses of wine per week    Comment: RARELY   Drug use: No   Sexual activity: Yes    Partners: Male    Birth control/protection: Post-menopausal  Other Topics Concern   Not on file  Social History Narrative   She is married , only has one son and he was diagnosed with colon cancer at age 33.    Social Drivers of Corporate investment banker Strain: Low Risk  (05/02/2023)   Overall Financial Resource Strain (CARDIA)    Difficulty of Paying Living Expenses: Not hard at all  Food Insecurity: No Food  Insecurity (11/26/2023)   Hunger Vital Sign    Worried About Running Out of Food in the Last Year: Never true    Ran Out of Food in the Last Year: Never true  Transportation Needs: No Transportation Needs (11/26/2023)   PRAPARE - Administrator, Civil Service (Medical): No    Lack of Transportation (Non-Medical): No  Physical Activity: Sufficiently Active (05/02/2023)   Exercise Vital Sign  Days of Exercise per Week: 7 days    Minutes of Exercise per Session: 30 min  Stress: No Stress Concern Present (05/02/2023)   [employer] of Occupational Health - Occupational Stress Questionnaire    Feeling of Stress : Only a little  Social Connections: Socially Integrated (11/26/2023)   Social Connection and Isolation Panel [NHANES]    Frequency of Communication with Friends and Family: More than three times a week    Frequency of Social Gatherings with Friends and Family: More than three times a week    Attends Religious Services: More than 4 times per year    Active Member of [employer] or Organizations: Yes    Attends Engineer, structural: More than 4 times per year    Marital Status: Married  Catering manager Violence: Not At Risk (11/26/2023)   Humiliation, Afraid, Rape, and Kick questionnaire    Fear of Current or Ex-Partner: No    Emotionally Abused: No    Physically Abused: No    Sexually Abused: No    Family History:    Family History  Problem Relation Age of Onset   Diabetes Mother    Hypertension Mother    Cancer Mother    Kidney disease Mother    Hypertension Father    Cancer Father        Prostate   Breast cancer Maternal Aunt 40   Colon cancer Son    Kidney cancer Neg Hx    Bladder Cancer Neg Hx      ROS:  Please see the history of present illness.   Physical Exam/Data:   Vitals:   11/26/23 0404 11/26/23 0500 11/26/23 0600 11/26/23 0723  BP: (!) 103/47 (!) 100/49 (!) 92/47   Pulse: 81 78 68   Resp: 16 18 17    Temp: 98.4 F (36.9 C) 97.8  F (36.6 C) (!) 97.5 F (36.4 C) (!) 97.5 F (36.4 C)  TempSrc: Oral Oral Oral Oral  SpO2: 100% 96% 96%   Weight: 64.5 kg     Height:        Intake/Output Summary (Last 24 hours) at 11/26/2023 0929 Last data filed at 11/26/2023 7846 Gross per 24 hour  Intake 897.56 ml  Output 0 ml  Net 897.56 ml      11/26/2023    4:04 AM 11/26/2023   12:41 AM 10/29/2023   10:56 AM  Last 3 Weights  Weight (lbs) 142 lb 3.2 oz 152 lb 152 lb 2 oz  Weight (kg) 64.5 kg 68.947 kg 69.003 kg     Body mass index is 25.19 kg/m.  General:  Well nourished, well developed, in no acute distress HEENT: normal Neck: no JVD Vascular: No carotid bruits; Distal pulses 2+ bilaterally Cardiac:  normal S1, S2; RRR; no murmur  Lungs:  clear to auscultation bilaterally, no wheezing, rhonchi or rales  Abd: soft, nontender, no hepatomegaly  Ext: no edema Skin: warm and dry  Psych:  Normal affect   EKG:  The EKG was personally reviewed and demonstrates:  atrial fibrillation with nonspecific ST abnormalities, likely rate related 149 bpm Telemetry:  Telemetry was personally reviewed and demonstrates:  atrial fibrillation with conversion to sinus rhythm ~ 0235 on 5/27  Relevant CV Studies:  04/19/2023 Echo complete 1. Left ventricular ejection fraction, by estimation, is 60 to 65%. The  left ventricle has normal function. The left ventricle has no regional  wall motion abnormalities. There is mild left ventricular hypertrophy.  Left ventricular diastolic parameters  are  consistent with Grade II diastolic dysfunction (pseudonormalization).   2. Right ventricular systolic function is normal. The right ventricular  size is normal.   3. Left atrial size was mildly dilated.   4. The mitral valve is degenerative. Mild mitral valve regurgitation.   5. The aortic valve is tricuspid. Aortic valve regurgitation is not  visualized. Aortic valve sclerosis/calcification is present, without any  evidence of aortic stenosis.    6. The inferior vena cava is normal in size with <50% respiratory  variability, suggesting right atrial pressure of 8 mmHg.   01/25/2021 R/LHC Conclusions: Mild to moderate, non-obstructive coronary artery disease, including 30% mid LAD disease, 50-60% mid/distal LCx stenosis, and 60% proximal/mid RCA lesion. Mildly to moderately reduced left ventricular systolic function with mid anterior hypo/akinesis; query atypical Takotsubo variant.  LVEF ~45%. Mildly elevated left heart filling pressures. Moderately elevated right heart filling and pulmonary artery pressures with significant pulmonary vascular resistance. Mildly reduced Fick cardiac output/index. Recommendations: Continue IV diuresis. Advance goal-directed medical therapy for cardiomyopathy, as tolerated.  Patient may benefit from outpatient advanced heart failure consultation. Increase metoprolol  for rate control of atrial fibrillation. Restart IV heparin  in 2 hours after TR band removal.  Transition back to apixaban  tomorrow AM if no evidence of bleeding/vascular injury. Secondary prevention of coronary artery disease.  Laboratory Data:  High Sensitivity Troponin:   Recent Labs  Lab 11/26/23 0111 11/26/23 0419  TROPONINIHS 8 59*     Chemistry Recent Labs  Lab 11/26/23 0111  NA 142  K 3.1*  CL 102  CO2 25  GLUCOSE 134*  BUN 17  CREATININE 1.02*  CALCIUM  8.9  MG 2.2  GFRNONAA 55*  ANIONGAP 15    Recent Labs  Lab 11/26/23 0111  PROT 6.0*  ALBUMIN 3.7  AST 31  ALT 22  ALKPHOS 40  BILITOT 1.0   Lipids No results for input(s): "CHOL", "TRIG", "HDL", "LABVLDL", "LDLCALC", "CHOLHDL" in the last 168 hours.  Hematology Recent Labs  Lab 11/26/23 0111  WBC 7.0  RBC 3.93  HGB 12.9  HCT 38.3  MCV 97.5  MCH 32.8  MCHC 33.7  RDW 12.4  PLT 166   Thyroid  No results for input(s): "TSH", "FREET4" in the last 168 hours.  BNPNo results for input(s): "BNP", "PROBNP" in the last 168 hours.  DDimer No results for  input(s): "DDIMER" in the last 168 hours.   Radiology/Studies:  No results found.   Assessment and Plan:   Persistent atrial fibrillation s/p ablation - Presented overnight with tachycardia and pressure in head, neck, chest and bilateral upper extremities - S/p unsuccessful DCCV x2 in the ED with subsequent addition of IV amiodarone  - Telemetry shows conversion to sinus rhythm ~0235 this morning, maintaining sinus rhythm - Check TSH - Recommend K > 4 and mag > 2 - Repeat echo ordered - Continue IV amiodarone , anticipate transition to oral amiodarone  tomorrow if maintaining sinus rhythm - Continue Eliquis  5 mg twice daily   HFpEF Hypotension - Most recent echo 04/2023 showed EF 60-65% with G2DD - Appears euvolemic on exam. Do not suspect acute exacerbation.  - Repeat echo ordered - Entresto , torsemide , and carvedilol  held secondary to hypotension - Continue jardiance - Further escalation of GDMT limited by hypotension  Hypokalemia - K 3.1 - Recommend repletion for K > 4  Non-obstructive CAD Elevated troponin - Cardiac catheterization 12/2020 showed mild to moderate non-obstructive CAD, detailed above - Patient presented in atrial fibrillation with RVR associated with pressure in head, neck, chest, and  bilateral upper extremities - Troponin 8>59, continue to trend  - Suspect demand ischemia in the setting of atrial fibrillation with RVR - Currently chest pain free - Repeat echo ordered, further recommendations pending results - If patient has recurrent chest pain or troponin rises significantly, consider inpatient ischemic evaluation  T2DM - A1C 6.6 - Ongoing management per IM   Risk Assessment/Risk Scores:      CHA2DS2-VASc Score = 7   This indicates a 11.2% annual risk of stroke. The patient's score is based upon: CHF History: 1 HTN History: 1 Diabetes History: 1 Stroke History: 0 Vascular Disease History: 1 Age Score: 2 Gender Score: 1      For  questions or updates, please contact [facility] Please consult [email or web address] for contact info under    Signed, Brodie Cannon, [name]  11/26/2023 9:29 AM

## 2023-11-26 NOTE — Assessment & Plan Note (Signed)
Continue basal insulin with sliding scale coverage

## 2023-11-26 NOTE — Assessment & Plan Note (Signed)
-   Continue carvedilol

## 2023-11-26 NOTE — ED Notes (Signed)
 Patient noted to be in NSR at this time.  New EKG performed.  ED MD made aware

## 2023-11-26 NOTE — Plan of Care (Signed)
  Problem: Education: Goal: Ability to describe self-care measures that may prevent or decrease complications (Diabetes Survival Skills Education) will improve Outcome: Progressing Goal: Individualized Educational Video(s) Outcome: Progressing   Problem: Coping: Goal: Ability to adjust to condition or change in health will improve Outcome: Progressing   Problem: Fluid Volume: Goal: Ability to maintain a balanced intake and output will improve Outcome: Progressing   Problem: Health Behavior/Discharge Planning: Goal: Ability to identify and utilize available resources and services will improve Outcome: Progressing Goal: Ability to manage health-related needs will improve Outcome: Progressing   Problem: Metabolic: Goal: Ability to maintain appropriate glucose levels will improve Outcome: Progressing   Problem: Nutritional: Goal: Maintenance of adequate nutrition will improve Outcome: Progressing Goal: Progress toward achieving an optimal weight will improve Outcome: Progressing   Problem: Skin Integrity: Goal: Risk for impaired skin integrity will decrease Outcome: Progressing   Problem: Tissue Perfusion: Goal: Adequacy of tissue perfusion will improve Outcome: Progressing   Problem: Education: Goal: Knowledge of disease or condition will improve Outcome: Progressing Goal: Understanding of medication regimen will improve Outcome: Progressing Goal: Individualized Educational Video(s) Outcome: Progressing   Problem: Activity: Goal: Ability to tolerate increased activity will improve Outcome: Progressing   Problem: Cardiac: Goal: Ability to achieve and maintain adequate cardiopulmonary perfusion will improve Outcome: Progressing   Problem: Health Behavior/Discharge Planning: Goal: Ability to safely manage health-related needs after discharge will improve Outcome: Progressing   Problem: Education: Goal: Knowledge of General Education information will  improve Description: Including pain rating scale, medication(s)/side effects and non-pharmacologic comfort measures Outcome: Progressing   Problem: Health Behavior/Discharge Planning: Goal: Ability to manage health-related needs will improve Outcome: Progressing   Problem: Clinical Measurements: Goal: Ability to maintain clinical measurements within normal limits will improve Outcome: Progressing Goal: Will remain free from infection Outcome: Progressing Goal: Diagnostic test results will improve Outcome: Progressing Goal: Respiratory complications will improve Outcome: Progressing Goal: Cardiovascular complication will be avoided Outcome: Progressing   Problem: Activity: Goal: Risk for activity intolerance will decrease Outcome: Progressing   Problem: Nutrition: Goal: Adequate nutrition will be maintained Outcome: Progressing   Problem: Coping: Goal: Level of anxiety will decrease Outcome: Progressing   Problem: Elimination: Goal: Will not experience complications related to bowel motility Outcome: Progressing Goal: Will not experience complications related to urinary retention Outcome: Progressing   Problem: Pain Managment: Goal: General experience of comfort will improve and/or be controlled Outcome: Progressing   Problem: Safety: Goal: Ability to remain free from injury will improve Outcome: Progressing   Problem: Skin Integrity: Goal: Risk for impaired skin integrity will decrease Outcome: Progressing

## 2023-11-26 NOTE — Consult Note (Signed)
 PHARMACY CONSULT NOTE - ELECTROLYTES  Pharmacy Consult for Electrolyte Monitoring and Replacement   Recent Labs: Height: 5\' 3"  (160 cm) Weight: 64.5 kg (142 lb 3.2 oz) IBW/kg (Calculated) : 52.4 Estimated Creatinine Clearance: 39.1 mL/min (A) (by C-G formula based on SCr of 1.02 mg/dL (H)). Potassium (mmol/L)  Date Value  11/26/2023 3.1 (L)   Magnesium  (mg/dL)  Date Value  64/40/3474 2.2   Calcium  (mg/dL)  Date Value  25/95/6387 8.9   Albumin (g/dL)  Date Value  56/43/3295 3.7  04/12/2021 4.3   Sodium (mmol/L)  Date Value  11/26/2023 142  02/18/2023 142    Assessment  Bonnie Rowland is a 83 y.o. female presenting with chest pain. PMH significant for  DM, HTN, nonobstructive CAD, HFpEF, NICM, persistent A. fib (on Eliquis  PTA). Pharmacy has been consulted to monitor and replace electrolytes.  Diet: NPO MIVF:IV lock  Goal of Therapy: Electrolytes WNL,will optimize Mg to =/>2 and K =/>4 for cardiac indication.  Plan:  K 3.1; provider already replaced K with Kcl 10meq IV x 1 dose and Kcl 40meq po x 1. Will order and additional Kcl po 40meq x 1 dose and follow up  with AM labs. Check renal function panel and Mg with AM labs  Thank you for allowing pharmacy to be a part of this patient's care.  Davanta Meuser Rodriguez-Guzman PharmD, BCPS 11/26/2023 7:20 AM

## 2023-11-26 NOTE — Sedation Documentation (Signed)
 Patient shocked at 200 J by MD at this time.  2nd attempt

## 2023-11-26 NOTE — H&P (Signed)
 History and Physical    Patient: Bonnie Rowland WGN:[id number] DOB: May 05, 1941 DOA: 11/26/2023 DOS: the patient was seen and examined on 11/26/2023 PCP: Arleen Lacer, MD  Patient coming from: Home  Chief Complaint:  Chief Complaint  Patient presents with   Chest Pain    HPI: Bonnie Rowland is a 83 y.o. female with medical history significant for DM, HTN, nonobstructive CAD, HFpEF, NICM, persistent A. fib with several prior cardioversions and subsequent successful ablation 08/2022, currently on Eliquis , last seen by her cardiologist 10/28/2023, being admitted with rapid A-fib, unstable due to hypotension, that failed DC cardioversion x 2 attempts in the ED, now stabilized on amiodarone  infusion.  Patient had sudden onset palpitations and shortness of breath and chest pressure prompting 911 call.  She was treated with 324 mg aspirin  en route.Bonnie Rowland  Upon her arrival, she was in rapid A-fib to the 140s to 160s with BP as low as 90/43.  The ED provider contacted on-call cardiologist Dr. Earmon Glow who recommended DCCV whereupon it was attempted with prior procedural sedation without success.Bonnie Rowland  She was started on an amiodarone  bolus and infusion.  BP stabilized to the low 100s and heart rate improved to 83 by admission. Additional ED workup: Troponin 8, potassium 3.1, magnesium  2.2, CBC WNL.  Creatinine 1.02 up from baseline of 0.67 a month ago  Hospitalist consulted for admission     Review of Systems: As mentioned in the history of present illness. All other systems reviewed and are negative.  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; left shoulder blade   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   Cataract 2003   Cellulitis and abscess of leg, except foot    Conjunctivitis unspecified    Dermatophytosis of nail    Diabetes mellitus    type II   Elevated troponin    Esophageal reflux    Hyperlipidemia    Hypertension    Osteoporosis 2016   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)    Renal artery stenosis (HCC)    Sprain of lumbar region    Thoracic or lumbosacral neuritis or radiculitis, unspecified    Urinary tract infection, site not specified    Past Surgical History:  Procedure Laterality Date   APPENDECTOMY     ATRIAL FIBRILLATION ABLATION N/A 09/25/2022   Procedure: ATRIAL FIBRILLATION ABLATION;  Surgeon: Boyce Byes, MD;  Location: [facility];  Service: Cardiovascular;  Laterality: N/A;   BIOPSY  02/28/2023   Procedure: BIOPSY;  Surgeon: Quintin Buckle, DO;  Location: [facility] ENDOSCOPY;  Service: Gastroenterology;;   CARDIAC CATHETERIZATION     CARDIOVERSION N/A 11/30/2020   Procedure: CARDIOVERSION;  Surgeon: Devorah Fonder, MD;  Location: [facility] ORS;  Service: Cardiovascular;  Laterality: N/A;   CARDIOVERSION N/A 01/20/2021   Procedure: CARDIOVERSION;  Surgeon: Devorah Fonder, MD;  Location: [facility] ORS;  Service: Cardiovascular;  Laterality: N/A;   CARDIOVERSION N/A 01/30/2021   Procedure: CARDIOVERSION;  Surgeon: Wenona Hamilton, MD;  Location:  [facility] ORS;  Service: Cardiovascular;  Laterality: N/A;   cataract surgery     COLONOSCOPY     COLONOSCOPY N/A 02/28/2023   Procedure: COLONOSCOPY;  Surgeon: Quintin Buckle, DO;  Location: [facility] ENDOSCOPY;  Service: Gastroenterology;  Laterality: N/A;   COLONOSCOPY WITH PROPOFOL  N/A 05/09/2016   Procedure: COLONOSCOPY WITH PROPOFOL ;  Surgeon: Marshall Skeeter, MD;  Location: [facility] ENDOSCOPY;  Service: Endoscopy;  Laterality: N/A;   DIAGNOSTIC  MAMMOGRAM     EYE SURGERY Bilateral    Cataract Extraction   JOINT REPLACEMENT  2016   KNEE ARTHROPLASTY Right 03/30/2015   Procedure: COMPUTER ASSISTED TOTAL KNEE ARTHROPLASTY;  Surgeon: Arlyne Lame, MD;  Location: [facility] ORS;  Service: Orthopedics;  Laterality: Right;   KNEE ARTHROSCOPY Right    KNEE CLOSED REDUCTION Right 05/23/2015   Procedure: CLOSED MANIPULATION KNEE;  Surgeon: Arlyne Lame, MD;  Location: [facility] ORS;  Service: Orthopedics;  Laterality: Right;   POLYPECTOMY  02/28/2023   Procedure: POLYPECTOMY;  Surgeon: Quintin Buckle, DO;  Location: [facility] ENDOSCOPY;  Service: Gastroenterology;;   RIGHT/LEFT HEART CATH AND CORONARY ANGIOGRAPHY N/A 01/25/2021   Procedure: RIGHT/LEFT HEART CATH AND CORONARY ANGIOGRAPHY;  Surgeon: Sammy Crisp, MD;  Location: [facility] INVASIVE CV LAB;  Service: Cardiovascular;  Laterality: N/A;   TONSILLECTOMY     Social History:  reports that she has never smoked. She has never been exposed to tobacco smoke. She has never used smokeless tobacco. She reports that she does not currently use alcohol  after a past usage of about 1.0 standard drink of alcohol  per week. She reports that she does not use drugs.  Allergies  Allergen Reactions   Flexeril  [Cyclobenzaprine ] Hypertension   Cheese Other (See Comments)    bloating   Ciprofloxacin Hcl Other (See Comments)    Muscle pain   Coconut (Cocos Nucifera) Other (See Comments)    Upset stomach   Keflex [Cephalexin] Other (See Comments)    Patient prefers not to take this medication due to the side effects, caused tendon issues    Family History  Problem Relation Age of Onset   Diabetes Mother    Hypertension Mother    Cancer Mother    Kidney disease Mother    Hypertension Father    Cancer Father        Prostate   Breast cancer Maternal Aunt 72   Colon cancer Son    Kidney cancer Neg Hx    Bladder Cancer Neg Hx     Prior to Admission medications   Medication Sig Start Date End Date  Taking? Authorizing Provider  acetaminophen  (TYLENOL ) 500 MG tablet Take 1,000 mg by mouth at bedtime.   Yes [provider]  ALFALFA PO Take 1 tablet by mouth daily.   Yes [provider]  amoxicillin  (AMOXIL ) 500 MG tablet Take 2,000 mg by mouth See admin instructions. Takes before dental procedures   Yes [provider]  ascorbic Acid  (VITAMIN C ) 500 MG CPCR Take 500 mg by mouth daily.   Yes [provider]  B Complex-C (B-COMPLEX WITH VITAMIN C ) tablet Take 1 tablet by mouth daily. **SKAKLEE OSTEOMATRIX**   Yes [provider]  BD PEN NEEDLE NANO U/F 32G X 4 MM MISC USE 4 TIMES DAILY (HUMALOG  3 TIMES DAILY AND LANTUS  ONCE DAILY) OFFICE NOTIFIED 08/06/17 12/07/18  Yes Sowles, Krichna, MD  Calcium  Carbonate (CALCIUM  600 PO) Take 600 mg by mouth daily.   Yes [provider]  carvedilol  (COREG ) 6.25 MG tablet  TAKE 1 TABLET BY MOUTH 2 TIMES DAILY WITH A MEAL. 07/19/23  Yes Wittenborn, Deborah, NP  cholecalciferol  (VITAMIN D ) 1000 UNITS tablet Take 1,000 Units by mouth daily. shaklee   Yes [provider]  Coenzyme Q10 (COQ10) 200 MG CAPS Take 200 mg by mouth daily.   Yes [provider]  ELIQUIS  5 MG TABS tablet TAKE 1 TABLET BY MOUTH TWICE A DAY 09/11/23  Yes Boyce Byes, MD  empagliflozin (JARDIANCE) 25 MG TABS tablet Take 25 mg by mouth daily. 08/26/22  Yes Berton Brock, MD  ENTRESTO  49-51 MG TAKE 1 TABLET BY MOUTH TWICE A DAY 12/10/22  Yes Hackney, Tina A, FNP  estradiol  (ESTRACE ) 0.1 MG/GM vaginal cream APPLY 1/2 GM ONCE WEEKLY USING APPLICATOR, APPLY BLUEBERRY SIZED AMOUNT OF CREAM USING TIP OF FINGER TO URETHRA TWICE WEEKLY 01/18/23  Yes McGowan, Cathleen Coach A, [name]  HUMALOG  KWIKPEN 100 UNIT/ML KiwkPen Inject 2-6 Units into the skin 3 (three) times daily as needed (High blood sugar). 10/15/17  Yes [provider]  insulin  glargine (LANTUS ) 100 unit/mL SOPN Inject 13 Units into the skin in the morning.   Yes  [provider]  Oksana Bergamo Oil 1000 MG CAPS Take 1,000 mg by mouth daily.   Yes [provider]  Mag Aspart-Potassium Aspart (POTASSIUM & MAGNESIUM  ASPARTAT PO) Take 1 tablet by mouth daily.   Yes [provider]  Multiple Vitamins-Minerals (PRESERVISION AREDS 2) CHEW Chew 1 each by mouth 2 (two) times daily.   Yes [provider]  nystatin  cream (MYCOSTATIN ) Apply 1 Application topically 2 (two) times daily. 03/26/23  Yes McGowan, Cathleen Coach A, [name]  OVER THE COUNTER MEDICATION Place 1 application  into both eyes See admin instructions. Blephadex eyelid foam, apply to eyelids once daily   Yes [provider]  OVER THE COUNTER MEDICATION Take 1 tablet by mouth daily. Vita-Lea Gold otc supplement With out vitamin K (Shaklee products)   Yes [provider]  OVER THE COUNTER MEDICATION Take 1 tablet by mouth daily. Nutriferon otc supplement   Yes [provider]  Probiotic Product (PROBIOTIC PO) Take 1 capsule by mouth at bedtime.   Yes [provider]  PROLIA  60 MG/ML SOSY injection Inject 60 mg into the skin every 6 (six) months. 09/02/19  Yes [provider]  rosuvastatin  (CRESTOR ) 10 MG tablet TAKE 1 TABLET BY MOUTH EVERY DAY 09/06/23  Yes Sowles, Krichna, MD  Sodium Chloride -Sodium Bicarb (NETI POT SINUS WASH NA) Place 1 Dose into the nose at bedtime.   Yes [provider]  tirzepatide Florence Hunt) 10 MG/0.5ML Pen Inject 10 mg into the skin once a week. 08/23/22  Yes Berton Brock, MD  tiZANidine  (ZANAFLEX ) 2 MG tablet TAKE 1-2 TABLETS (2-4 MG TOTAL) BY MOUTH AT BEDTIME. Patient taking differently: Take 4 mg by mouth at bedtime. 11/04/23  Yes Sowles, Krichna, MD  torsemide  (DEMADEX ) 20 MG tablet Take 3 tablets (60 mg total) by mouth daily. 10/29/23 01/27/24 Yes Charlette Console, FNP  trimethoprim -polymyxin b  (POLYTRIM ) ophthalmic solution Place 1 drop into both eyes in the morning, at noon, in the evening, and at bedtime.  08/08/23  Yes Lanetta Pion, NP  Vitamin D , Ergocalciferol , (DRISDOL) 1.25 MG (50000 UNIT) CAPS capsule Take 50,000 Units by mouth once a week. 10/02/23  Yes [provider]  clobetasol  ointment (TEMOVATE ) 0.05 % Apply 1 Application topically as needed (FOR BUG BITES).    [provider]  Continuous Glucose Sensor (DEXCOM G7 SENSOR) MISC  1 each by Does not apply route as directed.    [provider]  Elastic Bandages & Supports (MEDICAL COMPRESSION STOCKINGS) MISC 1 each by Does not apply route daily. 01/08/19   Sowles, Krichna, MD  Propylene Glycol (SYSTANE BALANCE OP) Place 1 drop into both eyes daily as needed (dry eyes).    [provider]  doxycycline  (VIBRA -TABS) 100 MG tablet Take 1 tablet (100 mg total) by mouth 2 (two) times daily. 01/16/22   Ruffin Cotton, DPM    Physical Exam: Vitals:   11/26/23 0215 11/26/23 0230 11/26/23 0245 11/26/23 0300  BP: (!) 100/54 (!) 106/51 (!) 97/51 (!) 99/51  Pulse: (!) 111 (!) 115 83 84  Resp: 15 (!) 21 18 17   Temp:      TempSrc:      SpO2: 97% 97% 99% 100%  Weight:      Height:       Physical Exam Vitals and nursing note reviewed.  Constitutional:      General: She is not in acute distress. HENT:     Head: Normocephalic and atraumatic.  Cardiovascular:     Rate and Rhythm: Normal rate and regular rhythm.     Heart sounds: Normal heart sounds.  Pulmonary:     Effort: Pulmonary effort is normal.     Breath sounds: Normal breath sounds.  Abdominal:     Palpations: Abdomen is soft.     Tenderness: There is no abdominal tenderness.  Neurological:     Mental Status: Mental status is at baseline.     Labs on Admission: I have personally reviewed following labs and imaging studies  CBC: Recent Labs  Lab 11/26/23 0111  WBC 7.0  NEUTROABS 4.7  HGB 12.9  HCT 38.3  MCV 97.5  PLT 166   Basic Metabolic Panel: Recent Labs  Lab 11/26/23 0111  NA 142  K 3.1*  CL 102  CO2 25  GLUCOSE 134*  BUN 17   CREATININE 1.02*  CALCIUM  8.9  MG 2.2   GFR: Estimated Creatinine Clearance: 40.3 mL/min (A) (by C-G formula based on SCr of 1.02 mg/dL (H)). Liver Function Tests: Recent Labs  Lab 11/26/23 0111  AST 31  ALT 22  ALKPHOS 40  BILITOT 1.0  PROT 6.0*  ALBUMIN 3.7   No results for input(s): "LIPASE", "AMYLASE" in the last 168 hours. No results for input(s): "AMMONIA" in the last 168 hours. Coagulation Profile: Recent Labs  Lab 11/26/23 0111  INR 1.6*   Cardiac Enzymes: No results for input(s): "CKTOTAL", "CKMB", "CKMBINDEX", "TROPONINI" in the last 168 hours. BNP (last 3 results) No results for input(s): "PROBNP" in the last 8760 hours. HbA1C: No results for input(s): "HGBA1C" in the last 72 hours. CBG: No results for input(s): "GLUCAP" in the last 168 hours. Lipid Profile: No results for input(s): "CHOL", "HDL", "LDLCALC", "TRIG", "CHOLHDL", "LDLDIRECT" in the last 72 hours. Thyroid  Function Tests: No results for input(s): "TSH", "T4TOTAL", "FREET4", "T3FREE", "THYROIDAB" in the last 72 hours. Anemia Panel: No results for input(s): "VITAMINB12", "FOLATE", "FERRITIN", "TIBC", "IRON", "RETICCTPCT" in the last 72 hours. Urine analysis:    Component Value Date/Time   COLORURINE YELLOW (A) 04/11/2022 2343   APPEARANCEUR Hazy (A) 03/26/2023 0836   LABSPEC 1.029 04/11/2022 2343   PHURINE 5.0 04/11/2022 2343   GLUCOSEU 2+ (A) 03/26/2023 0836   HGBUR NEGATIVE 04/11/2022 2343   BILIRUBINUR Negative 03/26/2023 0836   KETONESUR 5 (A) 04/11/2022 2343   PROTEINUR Negative 03/26/2023 0836   PROTEINUR 30 (A)  04/11/2022 2343   UROBILINOGEN 0.2 01/16/2016 0955   NITRITE Negative 03/26/2023 0836   NITRITE NEGATIVE 04/11/2022 2343   LEUKOCYTESUR Negative 03/26/2023 0836   LEUKOCYTESUR NEGATIVE 04/11/2022 2343    Radiological Exams on Admission: No results found. Data Reviewed for HPI: Relevant notes from primary care and specialist visits, past discharge summaries as available  in EHR, including Care Everywhere. Prior diagnostic testing as pertinent to current admission diagnoses Updated medications and problem lists for reconciliation ED course, including vitals, labs, imaging, treatment and response to treatment Triage notes, nursing and pharmacy notes and ED provider's notes Notable results as noted above in HPI      Assessment and Plan: * Atrial fibrillation with rapid ventricular response (HCC) History of multiple DC cardioversions History of successful ablation 08/2022 -Unsuccessful DCCV x 2 in the ED -Continue amiodarone  infusion started in the ED -Continue Eliquis  - Cardiology consult Will keep n.p.o. 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  Resume carvedilol  when BP has stabilized  DM (diabetes mellitus) (HCC) Continue basal insulin  with sliding scale coverage     DVT prophylaxis: Eliquis   Consults: [facility] cardiology  Advance Care Planning:   Code Status: Full Code (had an old DNR order from 2019 in chart-confirmed that it is no longer active)  Family Communication: none  Disposition Plan: Back to previous home environment  Severity of Illness: The appropriate patient status for this patient is OBSERVATION. Observation status is judged to be reasonable and necessary in order to  provide the required intensity of service to ensure the patient's safety. The patient's presenting symptoms, physical exam findings, and initial radiographic and laboratory data in the context of their medical condition is felt to place them at decreased risk for further clinical deterioration. Furthermore, it is anticipated that the patient will be medically stable for discharge from the hospital within 2 midnights of admission.   Author: Lanetta Pion, MD 11/26/2023 3:14 AM  For on call review [email or web address].

## 2023-11-26 NOTE — Progress Notes (Signed)
   11/26/23 0600  Vitals  Temp (!) 97.5 F (36.4 C)  Temp Source Oral  BP (!) 92/47  MAP (mmHg) (!) 61  BP Location Left Arm  BP Method Automatic  Patient Position (if appropriate) Lying  Pulse Rate 68  Pulse Rate Source Monitor  ECG Heart Rate 68  Resp 17  MEWS COLOR  MEWS Score Color Green  Oxygen Therapy  SpO2 96 %  O2 Device Room Air  MEWS Score  MEWS Temp 0  MEWS Systolic 1  MEWS Pulse 0  MEWS RR 0  MEWS LOC 0  MEWS Score 1  Provider Notification  Provider Name/Title Dr. Brion Cancel  Date Provider Notified 11/26/23  Time Provider Notified [phone]  Method of Notification Page (secure chat)  Notification Reason Change in status  Provider response Other (Comment) (waiting for response)

## 2023-11-27 DIAGNOSIS — E119 Type 2 diabetes mellitus without complications: Secondary | ICD-10-CM | POA: Diagnosis present

## 2023-11-27 DIAGNOSIS — I272 Pulmonary hypertension, unspecified: Secondary | ICD-10-CM | POA: Diagnosis present

## 2023-11-27 DIAGNOSIS — I428 Other cardiomyopathies: Secondary | ICD-10-CM | POA: Diagnosis present

## 2023-11-27 DIAGNOSIS — Z85820 Personal history of malignant melanoma of skin: Secondary | ICD-10-CM | POA: Diagnosis not present

## 2023-11-27 DIAGNOSIS — I2489 Other forms of acute ischemic heart disease: Secondary | ICD-10-CM | POA: Diagnosis present

## 2023-11-27 DIAGNOSIS — Z85828 Personal history of other malignant neoplasm of skin: Secondary | ICD-10-CM | POA: Diagnosis not present

## 2023-11-27 DIAGNOSIS — Z7901 Long term (current) use of anticoagulants: Secondary | ICD-10-CM | POA: Diagnosis not present

## 2023-11-27 DIAGNOSIS — E876 Hypokalemia: Secondary | ICD-10-CM

## 2023-11-27 DIAGNOSIS — E785 Hyperlipidemia, unspecified: Secondary | ICD-10-CM | POA: Diagnosis present

## 2023-11-27 DIAGNOSIS — I110 Hypertensive heart disease with heart failure: Secondary | ICD-10-CM | POA: Diagnosis present

## 2023-11-27 DIAGNOSIS — Z841 Family history of disorders of kidney and ureter: Secondary | ICD-10-CM | POA: Diagnosis not present

## 2023-11-27 DIAGNOSIS — I5032 Chronic diastolic (congestive) heart failure: Secondary | ICD-10-CM

## 2023-11-27 DIAGNOSIS — I2510 Atherosclerotic heart disease of native coronary artery without angina pectoris: Secondary | ICD-10-CM | POA: Diagnosis present

## 2023-11-27 DIAGNOSIS — Z96651 Presence of right artificial knee joint: Secondary | ICD-10-CM | POA: Diagnosis present

## 2023-11-27 DIAGNOSIS — Z8249 Family history of ischemic heart disease and other diseases of the circulatory system: Secondary | ICD-10-CM | POA: Diagnosis not present

## 2023-11-27 DIAGNOSIS — Z7985 Long-term (current) use of injectable non-insulin antidiabetic drugs: Secondary | ICD-10-CM | POA: Diagnosis not present

## 2023-11-27 DIAGNOSIS — I5042 Chronic combined systolic (congestive) and diastolic (congestive) heart failure: Secondary | ICD-10-CM | POA: Diagnosis present

## 2023-11-27 DIAGNOSIS — Z794 Long term (current) use of insulin: Secondary | ICD-10-CM | POA: Diagnosis not present

## 2023-11-27 DIAGNOSIS — I959 Hypotension, unspecified: Secondary | ICD-10-CM | POA: Diagnosis present

## 2023-11-27 DIAGNOSIS — Z79899 Other long term (current) drug therapy: Secondary | ICD-10-CM | POA: Diagnosis not present

## 2023-11-27 DIAGNOSIS — Z881 Allergy status to other antibiotic agents status: Secondary | ICD-10-CM | POA: Diagnosis not present

## 2023-11-27 DIAGNOSIS — I4819 Other persistent atrial fibrillation: Secondary | ICD-10-CM | POA: Diagnosis present

## 2023-11-27 DIAGNOSIS — I480 Paroxysmal atrial fibrillation: Secondary | ICD-10-CM | POA: Diagnosis present

## 2023-11-27 DIAGNOSIS — Z91018 Allergy to other foods: Secondary | ICD-10-CM | POA: Diagnosis not present

## 2023-11-27 DIAGNOSIS — Z7984 Long term (current) use of oral hypoglycemic drugs: Secondary | ICD-10-CM | POA: Diagnosis not present

## 2023-11-27 DIAGNOSIS — I4891 Unspecified atrial fibrillation: Secondary | ICD-10-CM | POA: Diagnosis present

## 2023-11-27 DIAGNOSIS — Z833 Family history of diabetes mellitus: Secondary | ICD-10-CM | POA: Diagnosis not present

## 2023-11-27 LAB — RENAL FUNCTION PANEL
Albumin: 3.1 g/dL — ABNORMAL LOW (ref 3.5–5.0)
Anion gap: 10 (ref 5–15)
BUN: 13 mg/dL (ref 8–23)
CO2: 26 mmol/L (ref 22–32)
Calcium: 8.2 mg/dL — ABNORMAL LOW (ref 8.9–10.3)
Chloride: 103 mmol/L (ref 98–111)
Creatinine, Ser: 0.69 mg/dL (ref 0.44–1.00)
GFR, Estimated: >60 mL/min (ref >60)
Glucose, Bld: 109 mg/dL — ABNORMAL HIGH (ref 70–99)
Phosphorus: 2.9 mg/dL (ref 2.5–4.6)
Potassium: 4 mmol/L (ref 3.5–5.1)
Sodium: 139 mmol/L (ref 135–145)

## 2023-11-27 LAB — GLUCOSE, CAPILLARY
Glucose-Capillary: 116 mg/dL — ABNORMAL HIGH (ref 70–99)
Glucose-Capillary: 112 mg/dL — ABNORMAL HIGH (ref 70–99)
Glucose-Capillary: 81 mg/dL (ref 70–99)
Glucose-Capillary: 104 mg/dL — ABNORMAL HIGH (ref 70–99)

## 2023-11-27 LAB — MAGNESIUM: Magnesium: 2.4 mg/dL (ref 1.7–2.4)

## 2023-11-27 MED ORDER — AMIODARONE HCL 200 MG PO TABS
400.0000 mg | ORAL_TABLET | Freq: Two times a day (BID) | ORAL | Status: DC
  Administered 2023-11-27 – 2023-11-28 (×3): 400 mg via ORAL
  Filled 2023-11-27 (×3): qty 2

## 2023-11-27 MED ORDER — AMIODARONE HCL 200 MG PO TABS: 200.0000 mg | ORAL_TABLET | Freq: Two times a day (BID) | ORAL | Status: DC

## 2023-11-27 MED ORDER — AMIODARONE HCL 200 MG PO TABS: 200.0000 mg | ORAL_TABLET | Freq: Every day | ORAL | Status: DC

## 2023-11-27 NOTE — Progress Notes (Signed)
 Rounding Note    Patient Name: Bonnie Rowland Date of Encounter: 11/27/2023  [facility] Cardiologist: Timothy Gollan, MD   Subjective   Patient reports feeling well this morning. Denies chest pain and shortness of breath. Appears euvolemic on exam. She is maintaining sinus rhythm. K improved to 4.0. Troponin peaked at 145>108.   Inpatient Medications    Scheduled Meds:  apixaban   5 mg Oral BID   ascorbic acid   500 mg Oral Daily   cholecalciferol   1,000 Units Oral Daily   empagliflozin  25 mg Oral Q breakfast   insulin  aspart  0-15 Units Subcutaneous TID WC   insulin  glargine-yfgn  13 Units Subcutaneous Daily   multivitamin  1 tablet Oral BID   rosuvastatin   10 mg Oral q1800   [START ON 11/30/2023] Vitamin D  (Ergocalciferol )  50,000 Units Oral Weekly   Continuous Infusions:  amiodarone  30 mg/hr (11/27/23 0258)   PRN Meds: acetaminophen , ALPRAZolam, ondansetron  (ZOFRAN ) IV   Vital Signs    Vitals:   11/26/23 2000 11/27/23 0200 11/27/23 0300 11/27/23 0757  BP: 112/71 (!) 110/51 (!) 127/45   Pulse: 66 (!) 56 60   Resp: 16 18 19    Temp: 98.5 F (36.9 C)  98.1 F (36.7 C) (P) 98.1 F (36.7 C)  TempSrc: Oral  Oral (P) Oral  SpO2: 92% 90% 94%   Weight:      Height:        Intake/Output Summary (Last 24 hours) at 11/27/2023 0919 Last data filed at 11/27/2023 0700 Gross per 24 hour  Intake 120 ml  Output 1050 ml  Net -930 ml      11/26/2023    4:04 AM 11/26/2023   12:41 AM 10/29/2023   10:56 AM  Last 3 Weights  Weight (lbs) 142 lb 3.2 oz 152 lb 152 lb 2 oz  Weight (kg) 64.5 kg 68.947 kg 69.003 kg      Telemetry    Sinus rhythm - Personally Reviewed  Physical Exam   GEN: No acute distress.   Neck: No JVD Cardiac: RRR, no murmurs, rubs, or gallops.  Respiratory: Clear to auscultation bilaterally. GI: Soft, nontender, non-distended  MS: No edema; No deformity. Neuro:  Nonfocal  Psych: Normal affect   Labs    High Sensitivity Troponin:    Recent Labs  Lab 11/26/23 0111 11/26/23 0419 11/26/23 1151 11/26/23 2010  TROPONINIHS 8 59* 145* 108*     Chemistry Recent Labs  Lab 11/26/23 0111 11/27/23 0338  NA 142 139  K 3.1* 4.0  CL 102 103  CO2 25 26  GLUCOSE 134* 109*  BUN 17 13  CREATININE 1.02* 0.69  CALCIUM  8.9 8.2*  MG 2.2 2.4  PROT 6.0*  --   ALBUMIN 3.7 3.1*  AST 31  --   ALT 22  --   ALKPHOS 40  --   BILITOT 1.0  --   GFRNONAA 55* >60  ANIONGAP 15 10    Lipids No results for input(s): "CHOL", "TRIG", "HDL", "LABVLDL", "LDLCALC", "CHOLHDL" in the last 168 hours.  Hematology Recent Labs  Lab 11/26/23 0111  WBC 7.0  RBC 3.93  HGB 12.9  HCT 38.3  MCV 97.5  MCH 32.8  MCHC 33.7  RDW 12.4  PLT 166   Thyroid   Recent Labs  Lab 11/26/23 0419  TSH 3.181    BNPNo results for input(s): "BNP", "PROBNP" in the last 168 hours.  DDimer No results for input(s): "DDIMER" in the last 168 hours.  Cardiac Studies   04/19/2023 Echo complete 1. Left ventricular ejection fraction, by estimation, is 60 to 65%. The  left ventricle has normal function. The left ventricle has no regional  wall motion abnormalities. There is mild left ventricular hypertrophy.  Left ventricular diastolic parameters  are consistent with Grade II diastolic dysfunction (pseudonormalization).   2. Right ventricular systolic function is normal. The right ventricular  size is normal.   3. Left atrial size was mildly dilated.   4. The mitral valve is degenerative. Mild mitral valve regurgitation.   5. The aortic valve is tricuspid. Aortic valve regurgitation is not  visualized. Aortic valve sclerosis/calcification is present, without any  evidence of aortic stenosis.   6. The inferior vena cava is normal in size with <50% respiratory  variability, suggesting right atrial pressure of 8 mmHg.    01/25/2021 R/LHC Conclusions: Mild to moderate, non-obstructive coronary artery disease, including 30% mid LAD disease, 50-60%  mid/distal LCx stenosis, and 60% proximal/mid RCA lesion. Mildly to moderately reduced left ventricular systolic function with mid anterior hypo/akinesis; query atypical Takotsubo variant.  LVEF ~45%. Mildly elevated left heart filling pressures. Moderately elevated right heart filling and pulmonary artery pressures with significant pulmonary vascular resistance. Mildly reduced Fick cardiac output/index. Recommendations: Continue IV diuresis. Advance goal-directed medical therapy for cardiomyopathy, as tolerated.  Patient may benefit from outpatient advanced heart failure consultation. Increase metoprolol  for rate control of atrial fibrillation. Restart IV heparin  in 2 hours after TR band removal.  Transition back to apixaban  tomorrow AM if no evidence of bleeding/vascular injury. Secondary prevention of coronary artery disease.  Patient Profile     83 y.o. female  with a hx of nonobstrucive CAD by [facility] 12/2020, chronic combined systolic and diastolic CHF, NICM with subsequent normalization of LV systolic function, persistent atrial fibrillation s/p repeat DCCV s/p repeat DCCV ablation 08/2022, left renal artery stenosis < 50%, bilateral ICA carotid stenosis 1 to 39%, type 2 diabetes, hypertension, hyperlipidemia, and obesity who is being seen for the ongoing evaluation of atrial fibrillation with RVR.  Assessment & Plan    Persistent atrial fibrillation s/p ablation - Presented 5/27 with tachycardia and pressure in the head, neck, chest, and bilateral upper extremities - S/p unsuccessful DCCV x2 in the ED with subsequent addition of IV amiodarone  - Converted to sinus rhythm ~0235 on 5/27 - TSH wnl - Recommend K > 4 and mag > 2 - Echo showed EF 65-70% with G3DD - Maintaining sinus rhythm - Will transition IV to oral amiodarone  400 mg twice daily for 7 days, followed by 200 mg twice daily for 7 days, followed by 200 mg daily thereafter - Continue Eliquis  5 mg twice daily - Will arrange close  follow up with EP  HFpEF Hypotension - Echo this admission with EF 65-70% with G3DD - Appears euvolemic on exam. Do not suspect acute exacerbation.  - Entresto , torsemide , and carvedilol  held secondary to hypotension at this time - Continue jardiance - Further escalation of GDMT limited by hypotension  Hypokalemia - K improved to 4.0 today, continue to replete for goal > 4  Non-obstructive CAD Elevated troponin - Cardiac catheterization 12/2020 showed mild to moderate non-obstructive CAD, detailed above - Patient presented in atrial fibrillation with RVR associated with pressure in head, neck, chest, and bilateral upper extremities - Troponin 8>59>145>108 - No further chest discomfort since conversion to sinus rhythm - Suspect demand ischemia in the setting of atrial fibrillation with RVR - Repeat echo with preserved EF and no RWMA -  Lab was unable to accommodate Lexiscan MPI today, will plan for tomorrow. NPO after midnight.   T2DM - A1C 6.6 - Ongoing management per IM  For questions or updates, please contact [facility] Please consult [email or web address] for contact info under        Signed, Brodie Cannon, [name]  11/27/2023, 9:19 AM

## 2023-11-27 NOTE — TOC CM/SW Note (Signed)
 Transition of Care [facility]) - Inpatient Brief Assessment   Patient Details  Name: Bonnie Rowland MRN: [id number] Date of Birth: [birth year]  Transition of Care [facility]) CM/SW Contact:    Odilia Bennett, LCSW Phone Number: 11/27/2023, 4:21 PM   Clinical Narrative: CSW reviewed chart. No TOC needs identified so far. CSW will continue to follow progress. Please place [facility] consult if any needs arise.  Transition of Care Asessment: Insurance and Status: Insurance coverage has been reviewed Patient has primary care physician: Yes Home environment has been reviewed: [address] [facility] Prior level of function:: Not documented Prior/Current Home Services: No current home services Social Drivers of Health Review: SDOH reviewed no interventions necessary Readmission risk has been reviewed: Yes Transition of care needs: no transition of care needs at this time

## 2023-11-27 NOTE — Progress Notes (Signed)
  Progress Note   Patient: Bonnie Rowland ZOX:[id number] DOB: Jul 28, 1940 DOA: 11/26/2023     0 DOS: the patient was seen and examined on 11/27/2023   Brief hospital course: RAMSIE OSTRANDER is a 83 y.o. female with medical history significant for DM, HTN, nonobstructive CAD, HFpEF, NICM, persistent A. fib with several prior cardioversions and subsequent successful ablation 08/2022, currently on Eliquis , last seen by her cardiologist 10/28/2023, being admitted with rapid A-fib, unstable due to hypotension, that failed DC cardioversion x 2 attempts in the ED. patient later converted to sinus on amiodarone  drip. Patient also had a prolonged pain in her chest, and the neck.  Troponin peak at 145.  Seen by cardiology, scheduled for nuclear stress test.   Principal Problem:   Atrial fibrillation with rapid ventricular response (HCC) Active Problems:   Hypotension   (HFpEF) heart failure with preserved ejection fraction (HCC)   Nonobstructive coronary artery disease   Hypokalemia   DM (diabetes mellitus) (HCC)   Non-ischemic cardiomyopathy (HCC)   Paroxysmal atrial fibrillation with RVR (HCC)   Assessment and Plan: * Atrial fibrillation with rapid ventricular response (HCC) History of successful ablation 08/2022 With hypotension associated with atrial fibrillation. -Unsuccessful DCCV x 2 in the ED But converted to sinus after starting amiodarone  drip.  Patient has been seen by cardiology, continued on Eliquis , changed to amiodarone  orally. Blood pressure is better.  Chest pain. Elevation of troponin. Non obstructive coronary disease. No additional chest pain today, cardiology has scheduled for nuclear test tomorrow.  (HFpEF) heart failure with preserved ejection fraction (HCC), chronic. Nonischemic cardiomyopathy History of Takotsubo cardiomyopathy EF 60 to 65% last echo Holding GDMT with Entresto , carvedilol  and continue torsemide  due to hypotension No evidence of  exacerbation.  Hypokalemia Resolved.   DM (diabetes mellitus) (HCC) Continue basal insulin  with sliding scale coverage       Subjective:  Patient doing well today, no chest pain.  No shortness of breath.  Physical Exam: Vitals:   11/27/23 0800 11/27/23 0900 11/27/23 1000 11/27/23 1100  BP: (!) 117/46 (!) 120/41 (!) 125/51 (!) 151/82  Pulse: 61 (!) 58 61 65  Resp: (!) 21 18 (!) 24 (!) 28  Temp:    97.9 F (36.6 C)  TempSrc:    Oral  SpO2: 94% 92% 96% 96%  Weight:      Height:       General exam: Appears calm and comfortable  Respiratory system: Clear to auscultation. Respiratory effort normal. Cardiovascular system: S1 & S2 heard, RRR. No JVD, murmurs, rubs, gallops or clicks. No pedal edema. Gastrointestinal system: Abdomen is nondistended, soft and nontender. No organomegaly or masses felt. Normal bowel sounds heard. Central nervous system: Alert and oriented. No focal neurological deficits. Extremities: Symmetric 5 x 5 power. Skin: No rashes, lesions or ulcers Psychiatry: Judgement and insight appear normal. Mood & affect appropriate.    Data Reviewed:  Reviewed lab results.  Family Communication: Husband updated at the bedside.  Disposition: Status is: Inpatient Remains inpatient appropriate because: Severity of disease, inpatient procedure.     Time spent: 35 minutes  Author: Donaciano Frizzle, MD 11/27/2023 12:10 PM  For on call review [email or web address].

## 2023-11-27 NOTE — Hospital Course (Signed)
 Bonnie Rowland is a 83 y.o. female with medical history significant for DM, HTN, nonobstructive CAD, HFpEF, NICM, persistent A. fib with several prior cardioversions and subsequent successful ablation 08/2022, currently on Eliquis , last seen by her cardiologist 10/28/2023, being admitted with rapid A-fib, unstable due to hypotension, that failed DC cardioversion x 2 attempts in the ED. patient later converted to sinus on amiodarone  drip. Patient also had a prolonged pain in her chest, and the neck.  Troponin peak at 145.  Seen by cardiology, scheduled for nuclear stress test.

## 2023-11-27 NOTE — Care Management Obs Status (Signed)
 MEDICARE OBSERVATION STATUS NOTIFICATION   Patient Details  Name: Bonnie Rowland MRN: [id number] Date of Birth: 1941/03/17   Medicare Observation Status Notification Given:  No (patient did not want a copy)    Anise Kerns 11/27/2023, 11:22 AM

## 2023-11-27 NOTE — Consult Note (Addendum)
 PHARMACY CONSULT NOTE - ELECTROLYTES  Pharmacy Consult for Electrolyte Monitoring and Replacement   Recent Labs: Height: 5\' 3"  (160 cm) Weight: 64.5 kg (142 lb 3.2 oz) IBW/kg (Calculated) : 52.4 Estimated Creatinine Clearance: 49.8 mL/min (by C-G formula based on SCr of 0.69 mg/dL). Potassium (mmol/L)  Date Value  11/27/2023 4.0   Magnesium  (mg/dL)  Date Value  96/29/5284 2.4   Calcium  (mg/dL)  Date Value  13/24/4010 8.2 (L)   Albumin (g/dL)  Date Value  27/25/3664 3.1 (L)  04/12/2021 4.3   Phosphorus (mg/dL)  Date Value  40/34/7425 2.9   Sodium (mmol/L)  Date Value  11/27/2023 139  02/18/2023 142    Assessment  Bonnie Rowland is a 83 y.o. female presenting with chest pain. PMH significant for  DM, HTN, nonobstructive CAD, HFpEF, NICM, persistent A. fib (on Eliquis  PTA). Pharmacy has been consulted to monitor and replace electrolytes.  Diet: NPO MIVF:IV lock  Goal of Therapy: Electrolytes WNL,will optimize Mg to =/>2 and K =/>4 for cardiac indication.  Plan:  K at 4 this morning with Mg at 2.4. NO additional replacement needed at this time. (Use Kcl 10meq or powder for easier swallow)  Follow AM labs  Thank you for allowing pharmacy to be a part of this patient's care.  Aman Batley Rodriguez-Guzman PharmD, BCPS 11/27/2023 7:27 AM

## 2023-11-27 NOTE — Plan of Care (Signed)
  Problem: Education: Goal: Ability to describe self-care measures that may prevent or decrease complications (Diabetes Survival Skills Education) will improve Outcome: Progressing Goal: Individualized Educational Video(s) Outcome: Progressing   Problem: Coping: Goal: Ability to adjust to condition or change in health will improve Outcome: Progressing   Problem: Fluid Volume: Goal: Ability to maintain a balanced intake and output will improve Outcome: Progressing   Problem: Health Behavior/Discharge Planning: Goal: Ability to identify and utilize available resources and services will improve Outcome: Progressing Goal: Ability to manage health-related needs will improve Outcome: Progressing   Problem: Metabolic: Goal: Ability to maintain appropriate glucose levels will improve Outcome: Progressing   Problem: Nutritional: Goal: Maintenance of adequate nutrition will improve Outcome: Progressing Goal: Progress toward achieving an optimal weight will improve Outcome: Progressing   Problem: Skin Integrity: Goal: Risk for impaired skin integrity will decrease Outcome: Progressing   Problem: Tissue Perfusion: Goal: Adequacy of tissue perfusion will improve Outcome: Progressing   Problem: Education: Goal: Knowledge of disease or condition will improve Outcome: Progressing Goal: Understanding of medication regimen will improve Outcome: Progressing Goal: Individualized Educational Video(s) Outcome: Progressing   Problem: Activity: Goal: Ability to tolerate increased activity will improve Outcome: Progressing   Problem: Cardiac: Goal: Ability to achieve and maintain adequate cardiopulmonary perfusion will improve Outcome: Progressing   Problem: Health Behavior/Discharge Planning: Goal: Ability to safely manage health-related needs after discharge will improve Outcome: Progressing   Problem: Education: Goal: Knowledge of General Education information will  improve Description: Including pain rating scale, medication(s)/side effects and non-pharmacologic comfort measures Outcome: Progressing   Problem: Health Behavior/Discharge Planning: Goal: Ability to manage health-related needs will improve Outcome: Progressing   Problem: Clinical Measurements: Goal: Ability to maintain clinical measurements within normal limits will improve Outcome: Progressing Goal: Will remain free from infection Outcome: Progressing Goal: Diagnostic test results will improve Outcome: Progressing Goal: Respiratory complications will improve Outcome: Progressing Goal: Cardiovascular complication will be avoided Outcome: Progressing   Problem: Activity: Goal: Risk for activity intolerance will decrease Outcome: Progressing   Problem: Nutrition: Goal: Adequate nutrition will be maintained Outcome: Progressing   Problem: Coping: Goal: Level of anxiety will decrease Outcome: Progressing   Problem: Elimination: Goal: Will not experience complications related to bowel motility Outcome: Progressing Goal: Will not experience complications related to urinary retention Outcome: Progressing   Problem: Pain Managment: Goal: General experience of comfort will improve and/or be controlled Outcome: Progressing   Problem: Safety: Goal: Ability to remain free from injury will improve Outcome: Progressing   Problem: Skin Integrity: Goal: Risk for impaired skin integrity will decrease Outcome: Progressing

## 2023-11-28 ENCOUNTER — Observation Stay (HOSPITAL_COMMUNITY)

## 2023-11-28 DIAGNOSIS — I5032 Chronic diastolic (congestive) heart failure: Secondary | ICD-10-CM | POA: Diagnosis not present

## 2023-11-28 DIAGNOSIS — E876 Hypokalemia: Secondary | ICD-10-CM | POA: Diagnosis not present

## 2023-11-28 DIAGNOSIS — I4891 Unspecified atrial fibrillation: Secondary | ICD-10-CM | POA: Diagnosis not present

## 2023-11-28 DIAGNOSIS — I2489 Other forms of acute ischemic heart disease: Secondary | ICD-10-CM

## 2023-11-28 LAB — BASIC METABOLIC PANEL WITH GFR
Anion gap: 7 (ref 5–15)
BUN: 10 mg/dL (ref 8–23)
CO2: 25 mmol/L (ref 22–32)
Calcium: 8.7 mg/dL — ABNORMAL LOW (ref 8.9–10.3)
Chloride: 106 mmol/L (ref 98–111)
Creatinine, Ser: 0.68 mg/dL (ref 0.44–1.00)
GFR, Estimated: >60 mL/min (ref >60)
Glucose, Bld: 143 mg/dL — ABNORMAL HIGH (ref 70–99)
Potassium: 4.4 mmol/L (ref 3.5–5.1)
Sodium: 138 mmol/L (ref 135–145)

## 2023-11-28 LAB — NM MYOCAR MULTI W/SPECT W/WALL MOTION / EF
LV dias vol: 58 mL (ref 46–106)
LV sys vol: 21 mL
Nuc Stress EF: 64 %
Peak HR: 86 {beats}/min
Rest HR: 72 {beats}/min
Rest Nuclear Isotope Dose: 10.5 mCi
SDS: 0
SRS: 0
SSS: 0
ST Depression (mm): 0 mm
Stress Nuclear Isotope Dose: 33.2 mCi
TID: 0.97

## 2023-11-28 LAB — GLUCOSE, CAPILLARY: Glucose-Capillary: 156 mg/dL — ABNORMAL HIGH (ref 70–99)

## 2023-11-28 MED ORDER — TORSEMIDE 20 MG PO TABS
60.0000 mg | ORAL_TABLET | Freq: Every day | ORAL | Status: DC
  Administered 2023-11-28: 60 mg via ORAL
  Filled 2023-11-28: qty 3

## 2023-11-28 MED ORDER — AMIODARONE HCL 200 MG PO TABS: ORAL_TABLET | ORAL | 0 refills | Status: DC

## 2023-11-28 MED ORDER — REGADENOSON 0.4 MG/5ML IV SOLN
0.4000 mg | Freq: Once | INTRAVENOUS | Status: AC
  Administered 2023-11-28: 0.4 mg via INTRAVENOUS

## 2023-11-28 MED ORDER — SACUBITRIL-VALSARTAN 24-26 MG PO TABS
1.0000 | ORAL_TABLET | Freq: Two times a day (BID) | ORAL | Status: DC
  Administered 2023-11-28: 1 via ORAL
  Filled 2023-11-28: qty 1

## 2023-11-28 MED ORDER — TECHNETIUM TC 99M TETROFOSMIN IV KIT
30.0000 | PACK | Freq: Once | INTRAVENOUS | Status: AC | PRN
Start: 2023-11-28 — End: 2023-11-28
  Administered 2023-11-28: 33.17 via INTRAVENOUS

## 2023-11-28 MED ORDER — TECHNETIUM TC 99M TETROFOSMIN IV KIT
10.5400 | PACK | Freq: Once | INTRAVENOUS | Status: AC | PRN
Start: 2023-11-28 — End: 2023-11-28
  Administered 2023-11-28: 10.54 via INTRAVENOUS

## 2023-11-28 NOTE — Discharge Summary (Signed)
 Physician Discharge Summary   Patient: Bonnie Rowland MRN: [id number] DOB: 1940-07-08  Admit date:     11/26/2023  Discharge date: 11/28/23  Discharge Physician: Donaciano Frizzle   PCP: Sowles, Krichna, MD   Recommendations at discharge:   Follow-up with PCP in 1 week. Follow-up with cardiology as scheduled.  Discharge Diagnoses: Principal Problem:   Atrial fibrillation with rapid ventricular response (HCC) Active Problems:   Hypotension   (HFpEF) heart failure with preserved ejection fraction (HCC)   Nonobstructive coronary artery disease   Hypokalemia   DM (diabetes mellitus) (HCC)   Non-ischemic cardiomyopathy (HCC)   Paroxysmal atrial fibrillation with RVR (HCC)  Resolved Problems:   * No resolved hospital problems. *  Hospital Course: Bonnie Rowland is a 83 y.o. female with medical history significant for DM, HTN, nonobstructive CAD, HFpEF, NICM, persistent A. fib with several prior cardioversions and subsequent successful ablation 08/2022, currently on Eliquis , last seen by her cardiologist 10/28/2023, being admitted with rapid A-fib, unstable due to hypotension, that failed DC cardioversion x 2 attempts in the ED. patient later converted to sinus on amiodarone  drip. Patient also had a prolonged pain in her chest, and the neck.  Troponin peak at 145.  Seen by cardiology, scheduled for nuclear stress test. Patient had Myoview  scan today, negative for ischemia.  Medically stable for discharge. Assessment and Plan: * Atrial fibrillation with rapid ventricular response (HCC) History of successful ablation 08/2022 With hypotension associated with atrial fibrillation. -Unsuccessful DCCV x 2 in the ED But converted to sinus after starting amiodarone  drip.  Patient has been seen by cardiology, continued on Eliquis , changed to amiodarone  orally. Blood pressure is better.  Resume beta-blocker.   Chest pain. Elevation of troponin. Non obstructive coronary disease. No additional  chest pain today, nuclear stress test did not show any evidence of ischemia.   (HFpEF) heart failure with preserved ejection fraction (HCC), chronic. Nonischemic cardiomyopathy History of Takotsubo cardiomyopathy Repeat echocardiogram showed ejection fraction 65 to 70% with grade 3 diastolic dysfunction, mild pulmonary hypertension.   No evidence of exacerbation.   Hypokalemia Resolved.     DM (diabetes mellitus) (HCC), type II, relatively controlled. Resumed home treatment.         Consultants: Cardiology Procedures performed: None  Disposition: Home health Diet recommendation:  Discharge Diet Orders (From admission, onward)     Start     Ordered   11/28/23 0000  Diet - low sodium heart healthy        11/28/23 1607           Cardiac diet DISCHARGE MEDICATION: Allergies as of 11/28/2023       Reactions   Flexeril  [cyclobenzaprine ] Hypertension   Cheese Other (See Comments)   bloating   Ciprofloxacin Hcl Other (See Comments)   Muscle pain   Coconut (cocos Nucifera) Other (See Comments)   Upset stomach   Keflex [cephalexin] Other (See Comments)   Patient prefers not to take this medication due to the side effects, caused tendon issues        Medication List     STOP taking these medications    amoxicillin  500 MG tablet Commonly known as: AMOXIL        TAKE these medications    acetaminophen  500 MG tablet Commonly known as: TYLENOL  Take 1,000 mg by mouth at bedtime.   ALFALFA PO Take 1 tablet by mouth daily.   amiodarone  200 MG tablet Commonly known as: PACERONE  Take 2 tablets (400 mg total) by mouth 2 (  two) times daily for 7 days, THEN 1 tablet (200 mg total) 2 (two) times daily for 7 days, THEN 1 tablet (200 mg total) daily. Start taking on: Nov 28, 2023   ascorbic Acid  500 MG Cpcr Commonly known as: VITAMIN C  Take 500 mg by mouth daily.   B-complex with vitamin C  tablet Take 1 tablet by mouth daily. **SKAKLEE OSTEOMATRIX**   BD Pen  Needle Nano U/F 32G X 4 MM Misc Generic drug: Insulin  Pen Needle USE 4 TIMES DAILY (HUMALOG  3 TIMES DAILY AND LANTUS  ONCE DAILY) OFFICE NOTIFIED 08/06/17   CALCIUM  600 PO Take 600 mg by mouth daily.   carvedilol  6.25 MG tablet Commonly known as: COREG  TAKE 1 TABLET BY MOUTH 2 TIMES DAILY WITH A MEAL.   cholecalciferol  1000 units tablet Commonly known as: VITAMIN D  Take 1,000 Units by mouth daily. shaklee   clobetasol  ointment 0.05 % Commonly known as: TEMOVATE  Apply 1 Application topically as needed (FOR BUG BITES).   CoQ10 200 MG Caps Take 200 mg by mouth daily.   Dexcom G7 Sensor Misc 1 each by Does not apply route as directed.   Eliquis  5 MG Tabs tablet Generic drug: apixaban  TAKE 1 TABLET BY MOUTH TWICE A DAY   empagliflozin 25 MG Tabs tablet Commonly known as: JARDIANCE Take 25 mg by mouth daily.   Entresto  49-51 MG Generic drug: sacubitril -valsartan  TAKE 1 TABLET BY MOUTH TWICE A DAY   estradiol  0.1 MG/GM vaginal cream Commonly known as: ESTRACE  APPLY 1/2 GM ONCE WEEKLY USING APPLICATOR, APPLY BLUEBERRY SIZED AMOUNT OF CREAM USING TIP OF FINGER TO URETHRA TWICE WEEKLY   HumaLOG  KwikPen 100 UNIT/ML KwikPen Generic drug: insulin  lispro Inject 2-6 Units into the skin 3 (three) times daily as needed (High blood sugar).   insulin  glargine 100 unit/mL Sopn Commonly known as: LANTUS  Inject 13 Units into the skin in the morning.   Krill Oil 1000 MG Caps Take 1,000 mg by mouth daily.   Medical Compression Stockings Misc 1 each by Does not apply route daily.   Mounjaro 10 MG/0.5ML Pen Generic drug: tirzepatide Inject 10 mg into the skin once a week.   NETI POT SINUS WASH NA Place 1 Dose into the nose at bedtime.   nystatin  cream Commonly known as: MYCOSTATIN  Apply 1 Application topically 2 (two) times daily.   OVER THE COUNTER MEDICATION Place 1 application  into both eyes See admin instructions. Blephadex eyelid foam, apply to eyelids once daily   OVER  THE COUNTER MEDICATION Take 1 tablet by mouth daily. Vita-Lea Gold otc supplement With out vitamin K (Shaklee products)   OVER THE COUNTER MEDICATION Take 1 tablet by mouth daily. Nutriferon otc supplement   POTASSIUM & MAGNESIUM  ASPARTAT PO Take 1 tablet by mouth daily.   PreserVision AREDS 2 Chew Chew 1 each by mouth 2 (two) times daily.   PROBIOTIC PO Take 1 capsule by mouth at bedtime.   Prolia  60 MG/ML Sosy injection Generic drug: denosumab  Inject 60 mg into the skin every 6 (six) months.   rosuvastatin  10 MG tablet Commonly known as: CRESTOR  TAKE 1 TABLET BY MOUTH EVERY DAY   SYSTANE BALANCE OP Place 1 drop into both eyes daily as needed (dry eyes).   tiZANidine  2 MG tablet Commonly known as: ZANAFLEX  TAKE 1-2 TABLETS (2-4 MG TOTAL) BY MOUTH AT BEDTIME. What changed: how much to take   torsemide  20 MG tablet Commonly known as: DEMADEX  Take 3 tablets (60 mg total) by mouth daily.  trimethoprim -polymyxin b  ophthalmic solution Commonly known as: Polytrim  Place 1 drop into both eyes in the morning, at noon, in the evening, and at bedtime.   Vitamin D  (Ergocalciferol ) 1.25 MG (50000 UNIT) Caps capsule Commonly known as: DRISDOL Take 50,000 Units by mouth once a week.        Follow-up Information     Sowles, Krichna, MD Follow up in 1 week(s).   Specialty: Family Medicine Contact information: [address] [place] [address] [phone]                Discharge Exam: Cleavon Curls Weights   11/26/23 0041 11/26/23 0404  Weight: 68.9 kg 64.5 kg   General exam: Appears calm and comfortable  Respiratory system: Clear to auscultation. Respiratory effort normal. Cardiovascular system: S1 & S2 heard, RRR. No JVD, murmurs, rubs, gallops or clicks. No pedal edema. Gastrointestinal system: Abdomen is nondistended, soft and nontender. No organomegaly or masses felt. Normal bowel sounds heard. Central nervous system: Alert and oriented. No focal  neurological deficits. Extremities: Symmetric 5 x 5 power. Skin: No rashes, lesions or ulcers Psychiatry: Judgement and insight appear normal. Mood & affect appropriate.    Condition at discharge: good  The results of significant diagnostics from this hospitalization (including imaging, microbiology, ancillary and laboratory) are listed below for reference.   Imaging Studies: ECHOCARDIOGRAM COMPLETE Result Date: 11/26/2023    ECHOCARDIOGRAM REPORT   Patient Name:   ARLINA SABINA [facility] Date of Exam: 11/26/2023 Medical Rec #:  [id number]          Height:       63.0 in Accession #:    [id number]         Weight:       142.2 lb Date of Birth:  05-01-1941          BSA:          1.673 m Patient Age:    83 years           BP:           92/47 mmHg Patient Gender: F                  HR:           68 bpm. Exam Location:  [facility] Procedure: 2D Echo, Cardiac Doppler and Color Doppler (Both Spectral and Color            Flow Doppler were utilized during procedure). Indications:     Atrial Fibrillation I48.91  History:         Patient has prior history of Echocardiogram examinations, most                  recent 04/19/2023. Risk Factors:Diabetes and Hypertension.                  Paroxysmal Afib.  Sonographer:     Broadus Canes Referring Phys:  [id number] CHRISTOPHER END Diagnosing Phys: Sammy Crisp MD IMPRESSIONS  1. Left ventricular ejection fraction, by estimation, is 65 to 70%. The left ventricle has normal function. The left ventricle has no regional wall motion abnormalities. Left ventricular diastolic parameters are consistent with Grade III diastolic dysfunction (restrictive). Elevated left atrial pressure.  2. Right ventricular systolic function is mildly reduced. The right ventricular size is normal. There is mildly elevated pulmonary artery systolic pressure.  3. Left atrial size was severely dilated.  4. The mitral valve is degenerative. Mild mitral valve regurgitation. No evidence of mitral stenosis.  Moderate mitral  annular calcification.  5. The aortic valve is tricuspid. There is mild thickening of the aortic valve. Aortic valve regurgitation is not visualized. Aortic valve sclerosis is present, with no evidence of aortic valve stenosis.  6. The inferior vena cava is normal in size with <50% respiratory variability, suggesting right atrial pressure of 8 mmHg. FINDINGS  Left Ventricle: Left ventricular ejection fraction, by estimation, is 65 to 70%. The left ventricle has normal function. The left ventricle has no regional wall motion abnormalities. The left ventricular internal cavity size was normal in size. There is  no left ventricular hypertrophy. Left ventricular diastolic parameters are consistent with Grade III diastolic dysfunction (restrictive). Elevated left atrial pressure. Right Ventricle: The right ventricular size is normal. No increase in right ventricular wall thickness. Right ventricular systolic function is mildly reduced. There is mildly elevated pulmonary artery systolic pressure. The tricuspid regurgitant velocity  is 2.66 m/s, and with an assumed right atrial pressure of 8 mmHg, the estimated right ventricular systolic pressure is 36.3 mmHg. Left Atrium: Left atrial size was severely dilated. Right Atrium: Right atrial size was normal in size. Pericardium: There is no evidence of pericardial effusion. Mitral Valve: The mitral valve is degenerative in appearance. Moderate mitral annular calcification. Mild mitral valve regurgitation. No evidence of mitral valve stenosis. MV peak gradient, 11.6 mmHg. The mean mitral valve gradient is 3.0 mmHg. Tricuspid Valve: The tricuspid valve is normal in structure. Tricuspid valve regurgitation is mild. Aortic Valve: The aortic valve is tricuspid. There is mild thickening of the aortic valve. Aortic valve regurgitation is not visualized. Aortic valve sclerosis is present, with no evidence of aortic valve stenosis. Aortic valve mean gradient measures 5.7  mmHg. Aortic  valve peak gradient measures 8.6 mmHg. Aortic valve area, by VTI measures 1.41 cm. Pulmonic Valve: The pulmonic valve was not well visualized. Pulmonic valve regurgitation is not visualized. No evidence of pulmonic stenosis. Aorta: The aortic root is normal in size and structure. Pulmonary Artery: The pulmonary artery is not well seen. Venous: The inferior vena cava is normal in size with less than 50% respiratory variability, suggesting right atrial pressure of 8 mmHg. IAS/Shunts: No atrial level shunt detected by color flow Doppler.  LEFT VENTRICLE PLAX 2D LVIDd:         3.80 cm   Diastology LVIDs:         2.40 cm   LV e' medial:    5.33 cm/s LV PW:         1.01 cm   LV E/e' medial:  24.8 LV IVS:        0.84 cm   LV e' lateral:   5.77 cm/s LVOT diam:     2.00 cm   LV E/e' lateral: 22.9 LV SV:         54 LV SV Index:   32 LVOT Area:     3.14 cm  RIGHT VENTRICLE RV Basal diam:  3.20 cm RV Mid diam:    2.70 cm RV S prime:     12.70 cm/s TAPSE (M-mode): 1.5 cm LEFT ATRIUM              Index        RIGHT ATRIUM           Index LA diam:        4.60 cm  2.75 cm/m   RA Area:     18.50 cm LA Vol (A2C):   103.0 ml 61.58 ml/m  RA  Volume:   49.00 ml  29.29 ml/m LA Vol (A4C):   93.4 ml  55.84 ml/m LA Biplane Vol: 99.7 ml  59.60 ml/m  AORTIC VALVE AV Area (Vmax):    1.54 cm AV Area (Vmean):   1.37 cm AV Area (VTI):     1.41 cm AV Vmax:           147.00 cm/s AV Vmean:          113.000 cm/s AV VTI:            0.384 m AV Peak Grad:      8.6 mmHg AV Mean Grad:      5.7 mmHg LVOT Vmax:         71.90 cm/s LVOT Vmean:        49.100 cm/s LVOT VTI:          0.172 m LVOT/AV VTI ratio: 0.45  AORTA Ao Root diam: 2.50 cm MITRAL VALVE                TRICUSPID VALVE MV Area (PHT): 4.41 cm     TR Peak grad:   28.3 mmHg MV Area VTI:   1.52 cm     TR Vmax:        266.00 cm/s MV Peak grad:  11.6 mmHg MV Mean grad:  3.0 mmHg     SHUNTS MV Vmax:       1.70 m/s     Systemic VTI:  0.17 m MV Vmean:      66.8 cm/s    Systemic Diam: 2.00 cm  MV Decel Time: 172 msec MV E velocity: 132.00 cm/s MV A velocity: 47.70 cm/s MV E/A ratio:  2.77 Veryl Gottron End MD Electronically signed by Sammy Crisp MD Signature Date/Time: 11/26/2023/3:11:26 PM    Final     Microbiology: Results for orders placed or performed during the hospital encounter of 08/06/23  SARS CORONAVIRUS 2 (TAT 6-24 HRS) Anterior Nasal Swab     Status: None   Collection Time: 08/06/23  3:02 PM   Specimen: Anterior Nasal Swab  Result Value Ref Range Status   SARS Coronavirus 2 NEGATIVE NEGATIVE Final    Comment: (NOTE) SARS-CoV-2 target nucleic acids are NOT DETECTED.  The SARS-CoV-2 RNA is generally detectable in upper and lower respiratory specimens during the acute phase of infection. Negative results do not preclude SARS-CoV-2 infection, do not rule out co-infections with other pathogens, and should not be used as the sole basis for treatment or other patient management decisions. Negative results must be combined with clinical observations, patient history, and epidemiological information. The expected result is Negative.  Fact Sheet for Patients: HairSlick.no  Fact Sheet for Healthcare Providers: quierodirigir.com  This test is not yet approved or cleared by the [place]  FDA and  has been authorized for detection and/or diagnosis of SARS-CoV-2 by FDA under an Emergency Use Authorization (EUA). This EUA will remain  in effect (meaning this test can be used) for the duration of the COVID-19 declaration under Se ction 564(b)(1) of the Act, 21 U.S.C. section 360bbb-3(b)(1), unless the authorization is terminated or revoked sooner.  Performed at [facility], [address], [place], [place] [address]     Labs: CBC: Recent Labs  Lab 11/26/23 0111  WBC 7.0  NEUTROABS 4.7  HGB 12.9  HCT 38.3  MCV 97.5  PLT 166   Basic Metabolic Panel: Recent Labs  Lab 11/26/23 0111  11/27/23 0338 11/28/23 0739  NA 142 139 138  K 3.1* 4.0 4.4  CL 102 103 106  CO2 25 26 25   GLUCOSE 134* 109* 143*  BUN 17 13 10   CREATININE 1.02* 0.69 0.68  CALCIUM  8.9 8.2* 8.7*  MG 2.2 2.4  --   PHOS  --  2.9  --    Liver Function Tests: Recent Labs  Lab 11/26/23 0111 11/27/23 0338  AST 31  --   ALT 22  --   ALKPHOS 40  --   BILITOT 1.0  --   PROT 6.0*  --   ALBUMIN 3.7 3.1*   CBG: Recent Labs  Lab 11/27/23 0756 11/27/23 1208 11/27/23 1646 11/27/23 2123 11/28/23 0755  GLUCAP 116* 112* 81 104* 156*    Discharge time spent: greater than 30 minutes.  Signed: Donaciano Frizzle, MD [facility] 11/28/2023

## 2023-11-28 NOTE — Progress Notes (Signed)
   Patient Name: Bonnie Rowland Date of Encounter: 11/28/2023 [facility] Cardiologist: Belva Boyden, MD   Interval Summary  .    Plan for Myoview  lexisan today. The patient remains in NSR. She denies chest pain. Feels a little SOB.   Vital Signs .    Vitals:   11/27/23 1700 11/27/23 2002 11/27/23 2336 11/28/23 0300  BP:  (!) 142/55 (!) 141/53   Pulse:  72 70   Resp:  18 (!) 24   Temp: 98.1 F (36.7 C) 99.1 F (37.3 C) 98.7 F (37.1 C) 98.4 F (36.9 C)  TempSrc: Oral Oral Oral Oral  SpO2:  95% 95%   Weight:      Height:        Intake/Output Summary (Last 24 hours) at 11/28/2023 0741 Last data filed at 11/27/2023 2336 Gross per 24 hour  Intake 501.12 ml  Output 650 ml  Net -148.88 ml      11/26/2023    4:04 AM 11/26/2023   12:41 AM 10/29/2023   10:56 AM  Last 3 Weights  Weight (lbs) 142 lb 3.2 oz 152 lb 152 lb 2 oz  Weight (kg) 64.5 kg 68.947 kg 69.003 kg      Telemetry/ECG    NSR HR 60-70s - Personally Reviewed  Physical Exam .   GEN: No acute distress.   Neck: No JVD Cardiac: RRR, no murmurs, rubs, or gallops.  Respiratory: mild crackles at bases GI: Soft, nontender, non-distended  MS: No edema  Assessment & Plan .    Persistent Afib s/p ablation - presented 5/27 with tachycardia and pressure in the head, neck and chest - s/p unsuccessful DCCV x2 in the ED with addition of IV amiodarone  with conversion to NSR - TSH wnl - Keep Mag>2 and K>4 - Echo showed LVEF 65-70%, G3DD - IV amio transitioned to oral amio  - continue Eliquis  5mg  BID - plan for OP follow-up with EP  HFpEF Hypotension - Echo this admission showed LVEF 65-70%, G3DD - patient appears euvolemic - hypotension was limiting GDMT>now improved - continue Farxiga 10mg  daily - will restart Entresto  and torsemide  - restart Coreg  as able  Hypokalemia - lab pending  Nonobstructive CAD Elevated troponin - cath in 2022 showed mild to mod nonobstructive CAD - chest pain in  the setting of rapid afib - HS trop up to 145, suspect demand ischemia in the setting of rapid afib - no further chest pain reported - repeat echo with preserved EF - plan for Myoview  lexiscan  today DM2   For questions or updates, please contact [facility] Please consult [email or web address] for contact info under        Signed, Mahdi Frye Rebekah Canada, [name]

## 2023-11-28 NOTE — Plan of Care (Signed)
  Problem: Education: Goal: Ability to describe self-care measures that may prevent or decrease complications (Diabetes Survival Skills Education) will improve Outcome: Progressing Goal: Individualized Educational Video(s) Outcome: Progressing   Problem: Coping: Goal: Ability to adjust to condition or change in health will improve Outcome: Progressing   Problem: Fluid Volume: Goal: Ability to maintain a balanced intake and output will improve Outcome: Progressing   Problem: Health Behavior/Discharge Planning: Goal: Ability to identify and utilize available resources and services will improve Outcome: Progressing Goal: Ability to manage health-related needs will improve Outcome: Progressing   Problem: Metabolic: Goal: Ability to maintain appropriate glucose levels will improve Outcome: Progressing   Problem: Nutritional: Goal: Maintenance of adequate nutrition will improve Outcome: Progressing Goal: Progress toward achieving an optimal weight will improve Outcome: Progressing   Problem: Skin Integrity: Goal: Risk for impaired skin integrity will decrease Outcome: Progressing   Problem: Tissue Perfusion: Goal: Adequacy of tissue perfusion will improve Outcome: Progressing   Problem: Education: Goal: Knowledge of disease or condition will improve Outcome: Progressing Goal: Understanding of medication regimen will improve Outcome: Progressing Goal: Individualized Educational Video(s) Outcome: Progressing   Problem: Activity: Goal: Ability to tolerate increased activity will improve Outcome: Progressing   Problem: Cardiac: Goal: Ability to achieve and maintain adequate cardiopulmonary perfusion will improve Outcome: Progressing   Problem: Health Behavior/Discharge Planning: Goal: Ability to safely manage health-related needs after discharge will improve Outcome: Progressing   Problem: Education: Goal: Knowledge of General Education information will  improve Description: Including pain rating scale, medication(s)/side effects and non-pharmacologic comfort measures Outcome: Progressing   Problem: Health Behavior/Discharge Planning: Goal: Ability to manage health-related needs will improve Outcome: Progressing   Problem: Clinical Measurements: Goal: Ability to maintain clinical measurements within normal limits will improve Outcome: Progressing Goal: Will remain free from infection Outcome: Progressing Goal: Diagnostic test results will improve Outcome: Progressing Goal: Respiratory complications will improve Outcome: Progressing Goal: Cardiovascular complication will be avoided Outcome: Progressing   Problem: Activity: Goal: Risk for activity intolerance will decrease Outcome: Progressing   Problem: Nutrition: Goal: Adequate nutrition will be maintained Outcome: Progressing   Problem: Coping: Goal: Level of anxiety will decrease Outcome: Progressing   Problem: Elimination: Goal: Will not experience complications related to bowel motility Outcome: Progressing Goal: Will not experience complications related to urinary retention Outcome: Progressing   Problem: Pain Managment: Goal: General experience of comfort will improve and/or be controlled Outcome: Progressing   Problem: Safety: Goal: Ability to remain free from injury will improve Outcome: Progressing   Problem: Skin Integrity: Goal: Risk for impaired skin integrity will decrease Outcome: Progressing

## 2023-11-28 NOTE — Consult Note (Signed)
 PHARMACY CONSULT NOTE - ELECTROLYTES  Pharmacy Consult for Electrolyte Monitoring and Replacement   Recent Labs: Height: 5\' 3"  (160 cm) Weight: 64.5 kg (142 lb 3.2 oz) IBW/kg (Calculated) : 52.4 Estimated Creatinine Clearance: 49.8 mL/min (by C-G formula based on SCr of 0.68 mg/dL). Potassium (mmol/L)  Date Value  11/28/2023 4.4   Magnesium  (mg/dL)  Date Value  08/65/7846 2.4   Calcium  (mg/dL)  Date Value  96/29/5284 8.7 (L)   Albumin (g/dL)  Date Value  13/24/4010 3.1 (L)  04/12/2021 4.3   Phosphorus (mg/dL)  Date Value  27/25/3664 2.9   Sodium (mmol/L)  Date Value  11/28/2023 138  02/18/2023 142    Assessment  Bonnie Rowland is a 83 y.o. female presenting with chest pain. PMH significant for  DM, HTN, nonobstructive CAD, HFpEF, NICM, persistent A. fib (on Eliquis  PTA). Pharmacy has been consulted to monitor and replace electrolytes.  Diet: NPO MIVF:IV lock  Goal of Therapy: Electrolytes WNL,will optimize Mg to =/>2 and K =/>4 for cardiac indication.  Plan:  K remains above 4. NO additional replacement needed at this time. (Will use Kcl 10meq or powder for easier swallow)  Follow AM labs  Thank you for allowing pharmacy to be a part of this patient's care.  Ladell Lea Rodriguez-Guzman PharmD, BCPS 11/28/2023 8:44 AM

## 2023-11-28 NOTE — Discharge Instructions (Signed)
 Follow up with PCP and pick up medications from pharmacy. Make sure to take all antibiotics. Do not double up on narcotic/pain medication or drink alcohol during this time. Do not drive while taking narcotic medication. Call 911 or return to ER for life threatening issues or other concerns.

## 2023-12-02 ENCOUNTER — Telehealth: Payer: Self-pay | Admitting: Family

## 2023-12-02 ENCOUNTER — Other Ambulatory Visit: Payer: Self-pay | Admitting: Family Medicine

## 2023-12-02 DIAGNOSIS — M9903 Segmental and somatic dysfunction of lumbar region: Secondary | ICD-10-CM | POA: Diagnosis not present

## 2023-12-02 DIAGNOSIS — M5033 Other cervical disc degeneration, cervicothoracic region: Secondary | ICD-10-CM | POA: Diagnosis not present

## 2023-12-02 DIAGNOSIS — M9901 Segmental and somatic dysfunction of cervical region: Secondary | ICD-10-CM | POA: Diagnosis not present

## 2023-12-02 DIAGNOSIS — Z1231 Encounter for screening mammogram for malignant neoplasm of breast: Secondary | ICD-10-CM

## 2023-12-02 DIAGNOSIS — M5416 Radiculopathy, lumbar region: Secondary | ICD-10-CM | POA: Diagnosis not present

## 2023-12-02 NOTE — Progress Notes (Signed)
 Advanced [facility] Failure Clinic Note    PCP: Sowles, Krichna, MD (last seen 03/25) Primary Cardiologist: Gollan, Timothy, MD / Varney Gentleman, [place] (last seen 11/24) EP: Harvie Liner, MD (last seen 01/25)   Chief Complaint: fatigue  HPI:  Bonnie Rowland is a 83 y/o female with a history of HTN, DM, hyperlipidemia, atrial fibrillation, GERD and chronic heart failure. DCCV on 11/30/2020 with recurrent Afib s/p repeat DCCV on amiodarone  01/20/2021, s/p repeat DCCV on 01/30/2021 and atrial flutter s/p ablation in 08/2022   Underwent LHC in 09/2005 which showed normal coronary arteries. Diagnosed with atrial flutter with RVR in 01/2015 after presenting for a total right knee replacement with spontaneous conversion to sinus rhythm. Echo in 09/2020 demonstrated an EF of 60 to 65%, no regional wall motion abnormalities, indeterminate LV diastolic function parameters, normal RV systolic function and ventricular cavity size, normal PASP, mildly dilated left atrium, and mild mitral regurgitation. Underwent DCCV on 11/30/2020 with conversion to sinus rhythm. 12/22/2020, she was noted to be back in Afib with controlled ventricular response. Loaded with amiodarone  and subsequently underwent repeat DCCV on 01/20/2021.   Admitted in 12/2020 with respiratory failure pulmonary edema in the setting of new LV dysfunction and recurrent A-fib.  Echo showed an EF of 45 to 50%, mild LVH, severe hypokinesis of the entire anterior and anteroseptal wall, moderately reduced RV systolic function with normal RV cavity size and mildly increased RV wall thickness, degenerative mitral valve with trivial regurgitation.  R/LHC showed mild to moderate, nonobstructive CAD including 30% mid LAD stenosis, 50 to 60% mid to distal LCx stenosis, and 60% proximal to mid RCA stenosis.  There was mildly to moderately reduced LV systolic function with mid anterior hypo-/akinesis with question of possible atypical Takotsubo variant with an LVEF of approximately  45%.  Moderately elevated right heart filling and pulmonary artery pressures with significant pulmonary vascular resistance and mildly reduced cardiac output and index.  She was diuresed, placed on tolerated GDMT, and underwent successful repeat DCCV    Echo 03/31/21: EF of  60-65% without structural changes.   Recurrent A-fib in the fall 2023 with unsuccessful DCCV in the ED x 2. She was subsequently evaluated by EP and underwent successful A-fib/flutter ablation on 09/25/2022.   Cardiac CT 09/18/22: Small foci of subsegmental atelectasis at the anterior lung bases and in LEFT lower lobe.  Echo on 04/19/2023 showed an EF of 60 to 65%, no regional wall motion abnormalities, mild LVH, grade 2 diastolic dysfunction, normal RV systolic function and ventricular cavity size, mildly dilated left atrium, degenerative mitral valve with mild regurgitation, and aortic valve sclerosis without evidence of stenosis.  Saw cardiology 11/24 & EP 01/25.   Admitted 11/26/23 with chest pain. Found to be in AF RVR. 2 unsuccessful DCCV attempts in ED. Converted to NSR on amiodarone  drip. Troponin peaked at 145. Cardiology consulted. Myoview  scan negative for ischemia. Amiodarone  drip changed to oral. Echo 11/26/23: ejection fraction 65 to 70% with grade 3 diastolic dysfunction, mild pulmonary hypertension.   She presents today for a HF follow-up visit with a chief complaint of minimal fatigue. Has associated occasional dizziness, tremors and hair loss.  She says that the tremors/ hair loss are due to the amiodarone  and that she experienced these symptoms when she was previously taking this. Also notes neck tightness when glucose is rising. Denies shortness of breath, chest pain, palpitations, abdominal distention or pedal edema.   Currently on amiodarone  taper and says that she decreases her dose tomorrow so  she is hoping the tremors improve. Has upcoming EP appointment to discuss another ablation.   ROS: All systems  negative except as listed in HPI, PMH and Problem List.  SH:  Social History   Socioeconomic History   Marital status: Married    Spouse name: Camera operator   Number of children: 1   Years of education: Not on file   Highest education level: Master's degree (e.g., MA, Bonnie, MEng, MEd, MSW, MBA)  Occupational History   Occupation: Retired  Tobacco Use   Smoking status: Never    Passive exposure: Never   Smokeless tobacco: Never   Tobacco comments:    Never smoke 10/23/22  Vaping Use   Vaping status: Never Used  Substance and Sexual Activity   Alcohol  use: Not Currently    Alcohol /week: 1.0 standard drink of alcohol     Types: 1 Glasses of wine per week    Comment: RARELY   Drug use: No   Sexual activity: Yes    Partners: Male    Birth control/protection: Post-menopausal  Other Topics Concern   Not on file  Social History Narrative   She is married , only has one son and he was diagnosed with colon cancer at age 60.    Social Drivers of Corporate investment banker Strain: Low Risk  (05/02/2023)   Overall Financial Resource Strain (CARDIA)    Difficulty of Paying Living Expenses: Not hard at all  Food Insecurity: No Food Insecurity (11/26/2023)   Hunger Vital Sign    Worried About Running Out of Food in the Last Year: Never true    Ran Out of Food in the Last Year: Never true  Transportation Needs: No Transportation Needs (11/26/2023)   PRAPARE - Administrator, Civil Service (Medical): No    Lack of Transportation (Non-Medical): No  Physical Activity: Sufficiently Active (05/02/2023)   Exercise Vital Sign    Days of Exercise per Week: 7 days    Minutes of Exercise per Session: 30 min  Stress: No Stress Concern Present (05/02/2023)   [employer] of Occupational Health - Occupational Stress Questionnaire    Feeling of Stress : Only a little  Social Connections: Socially Integrated (11/26/2023)   Social Connection and Isolation Panel [NHANES]    Frequency of  Communication with Friends and Family: More than three times a week    Frequency of Social Gatherings with Friends and Family: More than three times a week    Attends Religious Services: More than 4 times per year    Active Member of [employer] or Organizations: Yes    Attends Engineer, structural: More than 4 times per year    Marital Status: Married  Catering manager Violence: Not At Risk (11/26/2023)   Humiliation, Afraid, Rape, and Kick questionnaire    Fear of Current or Ex-Partner: No    Emotionally Abused: No    Physically Abused: No    Sexually Abused: No    FH:  Family History  Problem Relation Age of Onset   Diabetes Mother    Hypertension Mother    Cancer Mother    Kidney disease Mother    Hypertension Father    Cancer Father        Prostate   Breast cancer Maternal Aunt 17   Colon cancer Son    Kidney cancer Neg Hx    Bladder Cancer Neg Hx     Past Medical History:  Diagnosis Date   Acute cystitis  Acute on chronic combined systolic and diastolic CHF (congestive heart failure) (HCC)    Acute respiratory failure with hypoxia (HCC) 01/23/2021   AF (paroxysmal atrial fibrillation) (HCC) 01/23/2021   Allergic rhinitis, cause unspecified    Arthritis 2015   Atherosclerosis of renal artery (HCC)    left   Atrial fibrillation (HCC)    Bronchitis, not specified as acute or chronic    Cancer (HCC) 12/2013   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; left shoulder blade   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   Cataract 2003   Cellulitis and abscess of leg, except foot    Conjunctivitis unspecified    Dermatophytosis of nail    Diabetes mellitus    type II   Elevated troponin    Esophageal reflux    Hyperlipidemia    Hypertension    Osteoporosis 2016   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     ALFALFA PO Take 1 tablet by mouth daily.     amiodarone  (PACERONE ) 200 MG tablet Take 2 tablets (400 mg total) by mouth 2 (two) times daily for 7 days, THEN 1 tablet (200 mg total) 2 (two) times daily for 7 days, THEN 1 tablet (200 mg total) daily. 72 tablet 0   ascorbic Acid  (VITAMIN C ) 500 MG CPCR Take 500 mg by mouth daily.     B Complex-C (B-COMPLEX WITH VITAMIN C ) tablet Take 1 tablet by mouth daily. **SKAKLEE OSTEOMATRIX**     BD PEN NEEDLE NANO U/F 32G X 4 MM MISC USE 4 TIMES DAILY (HUMALOG  3 TIMES DAILY AND LANTUS  ONCE DAILY) OFFICE NOTIFIED 08/06/17 90 each 1   Calcium  Carbonate (CALCIUM  600 PO) Take 600 mg by mouth daily.     carvedilol  (COREG ) 6.25 MG tablet TAKE 1 TABLET BY MOUTH 2 TIMES DAILY WITH A MEAL. 180 tablet 3   cholecalciferol  (VITAMIN D ) 1000 UNITS tablet Take 1,000 Units by mouth daily. shaklee     clobetasol  ointment (TEMOVATE ) 0.05 % Apply 1 Application topically as needed (FOR BUG BITES).     Coenzyme Q10 (COQ10) 200 MG CAPS Take 200 mg by mouth daily.     Continuous Glucose Sensor (DEXCOM G7 SENSOR) MISC 1 each by Does not apply route as directed.     Elastic Bandages & Supports (MEDICAL COMPRESSION STOCKINGS) MISC 1 each by Does not apply route daily. 2 each 5   ELIQUIS  5 MG TABS tablet TAKE 1 TABLET BY MOUTH TWICE A DAY 180 tablet 1   empagliflozin  (JARDIANCE ) 25 MG TABS tablet Take 25 mg by mouth daily.     ENTRESTO  49-51 MG TAKE 1 TABLET BY MOUTH TWICE A DAY 180 tablet 3   estradiol  (ESTRACE ) 0.1 MG/GM vaginal cream APPLY 1/2 GM ONCE WEEKLY USING APPLICATOR, APPLY BLUEBERRY SIZED AMOUNT OF CREAM USING TIP OF FINGER TO URETHRA TWICE WEEKLY 42.5 g 3   HUMALOG  KWIKPEN 100 UNIT/ML KiwkPen Inject 2-6 Units into the skin 3 (three) times daily as needed (High blood sugar).  3   insulin  glargine (LANTUS ) 100 unit/mL SOPN Inject  13 Units into the skin in the morning.     Krill Oil 1000 MG CAPS  Take 1,000 mg by mouth daily.     Mag Aspart-Potassium Aspart (POTASSIUM & MAGNESIUM  ASPARTAT PO) Take 1 tablet by mouth daily.     Multiple Vitamins-Minerals (PRESERVISION AREDS 2) CHEW Chew 1 each by mouth 2 (two) times daily.     nystatin  cream (MYCOSTATIN ) Apply 1 Application topically 2 (two) times daily. 30 g 0   OVER THE COUNTER MEDICATION Place 1 application  into both eyes See admin instructions. Blephadex eyelid foam, apply to eyelids once daily     OVER THE COUNTER MEDICATION Take 1 tablet by mouth daily. Vita-Lea Gold otc supplement With out vitamin K (Shaklee products)     OVER THE COUNTER MEDICATION Take 1 tablet by mouth daily. Nutriferon otc supplement     Probiotic Product (PROBIOTIC PO) Take 1 capsule by mouth at bedtime.     PROLIA  60 MG/ML SOSY injection Inject 60 mg into the skin every 6 (six) months.     Propylene Glycol (SYSTANE BALANCE OP) Place 1 drop into both eyes daily as needed (dry eyes).     rosuvastatin  (CRESTOR ) 10 MG tablet TAKE 1 TABLET BY MOUTH EVERY DAY 90 tablet 0   Sodium Chloride -Sodium Bicarb (NETI POT SINUS WASH NA) Place 1 Dose into the nose at bedtime.     tirzepatide (MOUNJARO) 10 MG/0.5ML Pen Inject 10 mg into the skin once a week.     tiZANidine  (ZANAFLEX ) 2 MG tablet TAKE 1-2 TABLETS (2-4 MG TOTAL) BY MOUTH AT BEDTIME. (Patient taking differently: Take 4 mg by mouth at bedtime.) 180 tablet 1   torsemide  (DEMADEX ) 20 MG tablet Take 3 tablets (60 mg total) by mouth daily. 90 tablet 5   trimethoprim -polymyxin b  (POLYTRIM ) ophthalmic solution Place 1 drop into both eyes in the morning, at noon, in the evening, and at bedtime. 10 mL 0   Vitamin D , Ergocalciferol , (DRISDOL ) 1.25 MG (50000 UNIT) CAPS capsule Take 50,000 Units by mouth once a week.     No current facility-administered medications for this visit.   Vitals:   12/03/23 1101  BP: (!) 105/46  Pulse: 60  SpO2: 99%  Weight:  145 lb 6.4 oz (66 kg)   Wt Readings from Last 3 Encounters:  12/03/23 145 lb 6.4 oz (66 kg)  11/26/23 142 lb 3.2 oz (64.5 kg)  10/29/23 152 lb 2 oz (69 kg)   Lab Results  Component Value Date   CREATININE 0.68 11/28/2023   CREATININE 0.69 11/27/2023   CREATININE 1.02 (H) 11/26/2023    PHYSICAL EXAM:  General: Well appearing. No resp difficulty HEENT: normal Neck: supple, no JVD Cor: Regular rhythm, rate. No rubs, gallops or murmurs Lungs: clear Abdomen: soft, nontender, nondistended. Extremities: no cyanosis, clubbing, rash, trace pitting edema bilateral shins Neuro: alert & oriented X 3. Moves all 4 extremities w/o difficulty. Affect pleasant   ECG: SB with HR 55 (personally reviewed)   ASSESSMENT & PLAN:  1: NICM with preserved ejection fraction- - suspect due to atrial fibrillation - NYHA class II - euvolemic today - weighing daily; reminded to call for an overnight weight gain of > 2 pounds or a weekly weight gain of > 5 pounds.  - weight down 7 pounds from her last visit here 1 month ago - Echo 01/23/21: EF of 45-50% with mild LVH, severe hypokinesis of left ventricle and trivial MR.  - Echo 03/31/21: EF of  60-65% without structural changes.  - Echo 04/19/2023: EF of 60 to 65%, no regional wall motion abnormalities, mild LVH, G2DD,  normal RV systolic function and ventricular cavity size, mildly dilated left atrium, degenerative mitral valve with mild regurgitation, and aortic valve sclerosis without evidence of stenosis. - Echo 11/26/23: ejection fraction 65 to 70% with grade 3 diastolic dysfunction, mild pulmonary hypertension.  - is trying to walk between 3000-5000 steps daily - not adding salt and has been reading food labels - continue carvedilol  6.25mg  BID - continue jardiance  25mg  daily (DM dose) - continue entresto  49/51 BID - continue torsemide  60mg  daily; can try taking 40mg  AM with additional 20mg  PRN - wearing compression socks daily and removes HS - BNP  04/11/22 was 347.5  2: HTN- - BP 105/46 - saw PCP Bonnie Rowland) 03/25 - BMP 11/28/23 reviewed: sodium 138, potassium 4.4, creatinine 0.68  & GFR >60  3: DM- - A1c 09/12/23 was 6.6% - saw endocrinology Bonnie Rowland) 03/25  4: Paroxysmal atrial fibrillation- - unsuccessfully cardioverted twice 10/23 - ablation done 03/24 - saw EP (Bonnie Rowland) 01/25; returns later this month to discuss possibly another ablation - unsuccessful DCCV X 2 05/25 with conversion to NSR after amiodarone  drip - continue eliquis  5mg  BID - continue amiodarone  400mg  BID, begins taper down tomorrow; experiencing tremors & hair loss w/ this which she said she experienced with it previously - TSH 11/26/23 was 3.181 - will need routine eye exams - EKG today is SB with HR 55  5: Nonobstructive CAD- - saw cardiology (Bonnie Rowland) 11/24 - RHC/LHC done 01/25/21; Mild to moderate, non-obstructive coronary artery disease, including 30% mid LAD disease, 50-60% mid/distal LCx stenosis, and 60% proximal/mid RCA lesion. Mildly to moderately reduced left ventricular systolic function with mid anterior hypo/akinesis; query atypical Takotsubo variant.  LVEF ~45%. Mildly elevated left heart filling pressures. Moderately elevated right heart filling and pulmonary artery pressures with significant pulmonary vascular resistance. Mildly reduced Fick cardiac output/index. - LDL 09/13/23 was 30 - continue rosuvastatin  10mg  daily   Return in 2 months, sooner if needed.   Charlette Console, FNP 12/02/23

## 2023-12-02 NOTE — Telephone Encounter (Signed)
 Called to confirm/remind patient of their appointment at the [facility] on 12/03/23.   Appointment:   [] Confirmed  [x] Left mess   [] No answer/No voice mail  [] VM Full/unable to leave message  [] Phone not in service  Patient reminded to bring all medications and/or complete list.  Confirmed patient has transportation. Gave directions, instructed to utilize valet parking.

## 2023-12-03 ENCOUNTER — Encounter: Payer: Self-pay | Admitting: Family

## 2023-12-03 ENCOUNTER — Ambulatory Visit: Attending: Family | Admitting: Family

## 2023-12-03 VITALS — BP 105/46 | HR 60 | Wt 145.4 lb

## 2023-12-03 DIAGNOSIS — I5032 Chronic diastolic (congestive) heart failure: Secondary | ICD-10-CM

## 2023-12-03 DIAGNOSIS — I509 Heart failure, unspecified: Secondary | ICD-10-CM | POA: Diagnosis not present

## 2023-12-03 DIAGNOSIS — I10 Essential (primary) hypertension: Secondary | ICD-10-CM

## 2023-12-03 DIAGNOSIS — Z7984 Long term (current) use of oral hypoglycemic drugs: Secondary | ICD-10-CM | POA: Insufficient documentation

## 2023-12-03 DIAGNOSIS — Z794 Long term (current) use of insulin: Secondary | ICD-10-CM | POA: Insufficient documentation

## 2023-12-03 DIAGNOSIS — Z7901 Long term (current) use of anticoagulants: Secondary | ICD-10-CM | POA: Diagnosis not present

## 2023-12-03 DIAGNOSIS — I480 Paroxysmal atrial fibrillation: Secondary | ICD-10-CM | POA: Diagnosis not present

## 2023-12-03 DIAGNOSIS — Z79899 Other long term (current) drug therapy: Secondary | ICD-10-CM | POA: Insufficient documentation

## 2023-12-03 DIAGNOSIS — I110 Hypertensive heart disease with heart failure: Secondary | ICD-10-CM | POA: Insufficient documentation

## 2023-12-03 DIAGNOSIS — R9431 Abnormal electrocardiogram [ECG] [EKG]: Secondary | ICD-10-CM | POA: Insufficient documentation

## 2023-12-03 DIAGNOSIS — I2510 Atherosclerotic heart disease of native coronary artery without angina pectoris: Secondary | ICD-10-CM | POA: Diagnosis not present

## 2023-12-03 DIAGNOSIS — E785 Hyperlipidemia, unspecified: Secondary | ICD-10-CM | POA: Diagnosis not present

## 2023-12-03 DIAGNOSIS — L658 Other specified nonscarring hair loss: Secondary | ICD-10-CM | POA: Diagnosis not present

## 2023-12-03 DIAGNOSIS — E119 Type 2 diabetes mellitus without complications: Secondary | ICD-10-CM | POA: Insufficient documentation

## 2023-12-03 DIAGNOSIS — I428 Other cardiomyopathies: Secondary | ICD-10-CM | POA: Diagnosis not present

## 2023-12-03 DIAGNOSIS — T462X5A Adverse effect of other antidysrhythmic drugs, initial encounter: Secondary | ICD-10-CM | POA: Insufficient documentation

## 2023-12-03 DIAGNOSIS — R5383 Other fatigue: Secondary | ICD-10-CM | POA: Insufficient documentation

## 2023-12-03 DIAGNOSIS — R251 Tremor, unspecified: Secondary | ICD-10-CM | POA: Diagnosis not present

## 2023-12-03 DIAGNOSIS — R42 Dizziness and giddiness: Secondary | ICD-10-CM | POA: Diagnosis not present

## 2023-12-03 DIAGNOSIS — Z7985 Long-term (current) use of injectable non-insulin antidiabetic drugs: Secondary | ICD-10-CM | POA: Insufficient documentation

## 2023-12-03 DIAGNOSIS — E1169 Type 2 diabetes mellitus with other specified complication: Secondary | ICD-10-CM

## 2023-12-03 NOTE — Patient Instructions (Signed)
If you receive a satisfaction survey regarding the Heart Failure Clinic, please take the time to fill it out. This way we can continue to provide excellent care and make any changes that need to be made.

## 2023-12-05 ENCOUNTER — Ambulatory Visit (INDEPENDENT_AMBULATORY_CARE_PROVIDER_SITE_OTHER)

## 2023-12-05 ENCOUNTER — Other Ambulatory Visit: Payer: Self-pay | Admitting: Family

## 2023-12-05 ENCOUNTER — Other Ambulatory Visit: Payer: Self-pay | Admitting: Family Medicine

## 2023-12-05 DIAGNOSIS — Z23 Encounter for immunization: Secondary | ICD-10-CM | POA: Diagnosis not present

## 2023-12-05 DIAGNOSIS — I701 Atherosclerosis of renal artery: Secondary | ICD-10-CM

## 2023-12-05 DIAGNOSIS — E1169 Type 2 diabetes mellitus with other specified complication: Secondary | ICD-10-CM

## 2023-12-05 NOTE — Progress Notes (Signed)
 Patient is in office today for a nurse visit for Immunization. Injection was given in the  Right deltoid. Patient tolerated injection well.

## 2023-12-09 ENCOUNTER — Ambulatory Visit (INDEPENDENT_AMBULATORY_CARE_PROVIDER_SITE_OTHER): Admitting: Podiatry

## 2023-12-09 ENCOUNTER — Encounter: Payer: Self-pay | Admitting: Podiatry

## 2023-12-09 DIAGNOSIS — E119 Type 2 diabetes mellitus without complications: Secondary | ICD-10-CM

## 2023-12-09 DIAGNOSIS — Q828 Other specified congenital malformations of skin: Secondary | ICD-10-CM | POA: Diagnosis not present

## 2023-12-12 ENCOUNTER — Ambulatory Visit
Admission: RE | Admit: 2023-12-12 | Discharge: 2023-12-12 | Disposition: A | Discharge status: Home / Self Care | Source: Ambulatory Visit | Attending: Family Medicine | Admitting: Family Medicine

## 2023-12-12 DIAGNOSIS — Z1231 Encounter for screening mammogram for malignant neoplasm of breast: Secondary | ICD-10-CM | POA: Insufficient documentation

## 2023-12-14 NOTE — Progress Notes (Signed)
  Subjective:  Patient ID: Bonnie Rowland, female    DOB: December 12, 1940,  MRN: [id number]  Bonnie Rowland presents to clinic today for preventative diabetic foot care and painful porokeratotic lesions right foot. Pain prevent(s) comfortable ambulation. Aggravating factor is weightbearing with and without shoegear.  Chief Complaint  Patient presents with   Callouses    Painful callus lesions, 9 week follow up   New problem(s): None.   PCP is Arleen Lacer, MD. Bonnie Rowland 09/13/2023.  Allergies  Allergen Reactions   Flexeril  [Cyclobenzaprine ] Hypertension   Cheese Other (See Comments)    bloating   Ciprofloxacin Hcl Other (See Comments)    Muscle pain   Coconut (Cocos Nucifera) Other (See Comments)    Upset stomach   Keflex [Cephalexin] Other (See Comments)    Patient prefers not to take this medication due to the side effects, caused tendon issues    Review of Systems: Negative except as noted in the HPI.  Objective: No changes noted in today's physical examination. There were no vitals filed for this visit. Bonnie Rowland is a pleasant 83 y.o. female WD, WN in NAD. AAO x 3.  Vascular Examination: Capillary refill time immediate b/l. Palpable pedal pulses. Pedal hair present b/l. No pain with calf compression b/l. Skin temperature gradient WNL b/l. No cyanosis or clubbing b/l. No ischemia or gangrene noted b/l.   Neurological Examination: Sensation grossly intact b/l with 10 gram monofilament. Vibratory sensation intact b/l.   Dermatological Examination: Pedal skin with normal turgor, texture and tone b/l.  No open wounds. No interdigital macerations.   Toenails 1-5 b/l well maintained with adequate length. No erythema, no edema, no drainage, no fluctuance. Porokeratotic lesion(s) plantarlateral midfoot right foot, plantar heel pad of right foot, and 5th met base right foot. No erythema, no edema, no drainage, no fluctuance.  Musculoskeletal Examination: Muscle strength  5/5 to all lower extremity muscle groups bilaterally. Muscle strength 5/5 to all lower extremity muscle groups bilaterally. Plantarflexed metatarsal(s) 5th metatarsal head b/l lower extremities.. No pain, crepitus or joint limitation noted with ROM b/l LE.  Patient ambulates independently without assistive aids.  Radiographs: None  Last A1c:      09/12/2023   12:00 AM 01/01/2023   12:00 AM  Hemoglobin A1C  Hemoglobin-A1c 6.6     6.5         This result is from an external source.    Assessment/Plan: 1. Porokeratosis   2. Diabetes mellitus without complication (HCC)   -Consent given for treatment as described below: -Examined patient. -Continue supportive shoe gear daily. -Porokeratotic lesion(s) right heel, plantarlateral aspect of midfoot right foot, and 5th met base right foot pared and enucleated with sterile currette without incident. Total number of lesions debrided=3. -Patient/POA to call should there be question/concern in the interim.   Return in about 3 months (around 03/10/2024).  Bonnie Rowland, DPM      [place] LOCATION: [address], [place] [address]                   Office [phone]   [facility] LOCATION: [address] [place], [place] [address] Office [phone]

## 2023-12-16 DIAGNOSIS — M9901 Segmental and somatic dysfunction of cervical region: Secondary | ICD-10-CM | POA: Diagnosis not present

## 2023-12-16 DIAGNOSIS — M5416 Radiculopathy, lumbar region: Secondary | ICD-10-CM | POA: Diagnosis not present

## 2023-12-16 DIAGNOSIS — M9903 Segmental and somatic dysfunction of lumbar region: Secondary | ICD-10-CM | POA: Diagnosis not present

## 2023-12-16 DIAGNOSIS — M5033 Other cervical disc degeneration, cervicothoracic region: Secondary | ICD-10-CM | POA: Diagnosis not present

## 2023-12-24 NOTE — Progress Notes (Unsigned)
 Electrophysiology Office Follow up Visit Note:    Date:  12/25/2023   ID:  Bonnie Rowland, DOB May 15, 1941, MRN [id number]  PCP:  Glenard Mire, MD  [facility] Cardiologist:  Evalene Lunger, MD  [facility] Electrophysiologist:  Bonnie ONEIDA HOLTS, MD    Interval History:     Bonnie Rowland is a 83 y.o. female who presents for a follow up visit.   She presents for follow-up after recent hospitalization for atrial fibrillation.  She has a long history of atrial fibrillation with a prior catheter ablation in March 2024 during which the veins, posterior wall and CTI were ablated.  She was admitted May 27 through May 29.  She was admitted with symptomatic atrial fibrillation.  Her atrial fibrillation was rapidly conducted and associated with hypotension and chest pain.  She was started on amiodarone  which converted her back to sinus rhythm.  She had a stress test which was not suggestive of ischemia.  She is with her husband in clinic.  She is overall tolerating the amiodarone  but does report increase hair loss and tremor that are both new since this.  She would like to avoid indefinite use of amiodarone  if at all possible.  She does report 2 shorter episodes of atrial fibrillation that occurred before her more significant episode May 27.  She is interested in redo catheter ablation in an effort to stop her amiodarone .       Past medical, surgical, social and family history were reviewed.  ROS:   Please see the history of present illness.    All other systems reviewed and are negative.  EKGs/Labs/Other Studies Reviewed:    The following studies were reviewed today:  Nov 26, 2023 echo EF 65-70 RV mildly reduced Severely dilated left atrium Mild MR  Nov 26, 2023 EKG shows atrial fibrillation with rapid ventricular rate.  December 03, 2023 EKG shows sinus rhythm.  QTc 453 ms   EKG Interpretation Date/Time:  Wednesday December 25 2023 10:16:15 EDT Ventricular Rate:   57 PR Interval:  174 QRS Duration:  74 QT Interval:  482 QTC Calculation: 469 R Axis:   116  Text Interpretation: Sinus bradycardia Right axis deviation Low voltage QRS Confirmed by Rowland Bonnie [phone]) on 12/25/2023 10:22:30 AM    Physical Exam:    VS:  BP 133/69   Pulse (!) 57   Ht 5' 3 (1.6 m)   Wt 147 lb 9.6 oz (67 kg)   SpO2 96%   BMI 26.15 kg/m     Wt Readings from Last 3 Encounters:  12/25/23 147 lb 9.6 oz (67 kg)  12/03/23 145 lb 6.4 oz (66 kg)  11/26/23 142 lb 3.2 oz (64.5 kg)     GEN: no distress CARD: RRR, No MRG RESP: No IWOB. CTAB.      ASSESSMENT:    1. Persistent atrial fibrillation (HCC)   2. Encounter for long-term (current) use of high-risk medication    PLAN:    In order of problems listed above:  #Persistent atrial fibrillation #High risk drug monitoring-amiodarone  Symptomatic.  Prior catheter ablation in March 2024.  I discussed treatment options with the patient including continuing amiodarone  versus pursuing redo catheter ablation.  Discussed treatment options today for AF including antiarrhythmic drug therapy and ablation. Discussed risks, recovery and likelihood of success with each treatment strategy. Risk, benefits, and alternatives to EP study and ablation for afib were discussed. These risks include but are not limited to stroke, bleeding, vascular damage, tamponade, perforation,  damage to the esophagus, lungs, phrenic nerve and other structures, pulmonary vein stenosis, worsening renal function, coronary vasospasm and death.  Discussed potential need for repeat ablation procedures and antiarrhythmic drugs after an initial ablation. The patient understands these risk and wishes to proceed.  We will therefore proceed with catheter ablation at the next available time.  Carto, ICE, anesthesia are requested for the procedure.  Will also obtain CT PV protocol prior to the procedure to exclude LAA thrombus and further evaluate atrial  anatomy.   Signed, Bonnie Holts, MD, [facility] 12/25/2023 10:40 AM    Electrophysiology [facility]

## 2023-12-25 ENCOUNTER — Ambulatory Visit: Attending: Cardiology | Admitting: Cardiology

## 2023-12-25 ENCOUNTER — Other Ambulatory Visit: Payer: Self-pay

## 2023-12-25 ENCOUNTER — Other Ambulatory Visit: Payer: Self-pay | Admitting: Urology

## 2023-12-25 VITALS — BP 133/69 | HR 57 | Ht 63.0 in | Wt 147.6 lb

## 2023-12-25 DIAGNOSIS — I4819 Other persistent atrial fibrillation: Secondary | ICD-10-CM

## 2023-12-25 DIAGNOSIS — Z79899 Other long term (current) drug therapy: Secondary | ICD-10-CM

## 2023-12-25 DIAGNOSIS — B356 Tinea cruris: Secondary | ICD-10-CM

## 2023-12-25 NOTE — Patient Instructions (Addendum)
 Medication Instructions:  Your physician recommends that you continue on your current medications as directed. Please refer to the Current Medication list given to you today.  *If you need a refill on your cardiac medications before your next appointment, please call your pharmacy*  Lab Work: BMET and CBC - you may go to any [facility] location to have these drawn the week of August 18th  Testing/Procedures: Cardiac CT Your physician has requested that you have cardiac CT. Cardiac computed tomography (CT) is a painless test that uses an x-ray machine to take clear, detailed pictures of your heart. For further information please visit [email or web address]  We will call you to schedule your CT scan. It will be done about three weeks prior to your ablation.  Ablation Your physician has recommended that you have an ablation. Catheter ablation is a medical procedure used to treat some cardiac arrhythmias (irregular heartbeats). During catheter ablation, a long, thin, flexible tube is put into a blood vessel in your groin (upper thigh), or neck. This tube is called an ablation catheter. It is then guided to your heart through the blood vessel. Radio frequency waves destroy small areas of heart tissue where abnormal heartbeats may cause an arrhythmia to start.  You are scheduled for Atrial Fibrillation Ablation on Friday, September 12 with Dr. Ole Holts.Please arrive at the Main Entrance A at [facility]: [address] [place], [place] [address] at 8:00 AM    Follow-Up: At [facility], you and your health needs are our priority.  As part of our continuing mission to provide you with exceptional heart care, we have created designated Provider Care Teams.  These Care Teams include your primary Cardiologist (physician) and Advanced Practice Providers (APPs -  Physician Assistants and Nurse Practitioners) who all work together to provide you with the care you need, when you need it.    Your next appointment:   We will contact you about your post-procedure follow up appointments.

## 2023-12-30 DIAGNOSIS — M5416 Radiculopathy, lumbar region: Secondary | ICD-10-CM | POA: Diagnosis not present

## 2023-12-30 DIAGNOSIS — M5033 Other cervical disc degeneration, cervicothoracic region: Secondary | ICD-10-CM | POA: Diagnosis not present

## 2023-12-30 DIAGNOSIS — M9901 Segmental and somatic dysfunction of cervical region: Secondary | ICD-10-CM | POA: Diagnosis not present

## 2023-12-30 DIAGNOSIS — M9903 Segmental and somatic dysfunction of lumbar region: Secondary | ICD-10-CM | POA: Diagnosis not present

## 2024-01-07 DIAGNOSIS — Z961 Presence of intraocular lens: Secondary | ICD-10-CM | POA: Diagnosis not present

## 2024-01-07 DIAGNOSIS — E119 Type 2 diabetes mellitus without complications: Secondary | ICD-10-CM | POA: Diagnosis not present

## 2024-01-07 DIAGNOSIS — H3554 Dystrophies primarily involving the retinal pigment epithelium: Secondary | ICD-10-CM | POA: Diagnosis not present

## 2024-01-07 LAB — HM DIABETES EYE EXAM

## 2024-01-13 ENCOUNTER — Telehealth: Payer: Self-pay | Admitting: Cardiovascular Disease

## 2024-01-13 DIAGNOSIS — M5033 Other cervical disc degeneration, cervicothoracic region: Secondary | ICD-10-CM | POA: Diagnosis not present

## 2024-01-13 DIAGNOSIS — M5416 Radiculopathy, lumbar region: Secondary | ICD-10-CM | POA: Diagnosis not present

## 2024-01-13 DIAGNOSIS — M9901 Segmental and somatic dysfunction of cervical region: Secondary | ICD-10-CM | POA: Diagnosis not present

## 2024-01-13 DIAGNOSIS — M9903 Segmental and somatic dysfunction of lumbar region: Secondary | ICD-10-CM | POA: Diagnosis not present

## 2024-01-13 MED ORDER — AMIODARONE HCL 200 MG PO TABS: 200.0000 mg | ORAL_TABLET | Freq: Every day | ORAL | 3 refills | Status: DC

## 2024-01-13 NOTE — Telephone Encounter (Signed)
 RX sent to requested Pharmacy

## 2024-01-13 NOTE — Telephone Encounter (Signed)
*  STAT* If patient is at the pharmacy, call can be transferred to refill team.   1. Which medications need to be refilled? (please list name of each medication and dose if known) amiodarone  (PACERONE ) 200 MG tablet (Expired)    2. Would you like to learn more about the convenience, safety, & potential cost savings by using the [facility]?   3. Are you open to using the [facility] (Type [facility]. ).   4. Which pharmacy/location (including street and city if local pharmacy) is medication to be sent to?[id number] [address], [place] [phone]    5. Do they need a 30 day or 90 day supply? 90 day  Pt out of medication

## 2024-01-27 DIAGNOSIS — M5416 Radiculopathy, lumbar region: Secondary | ICD-10-CM | POA: Diagnosis not present

## 2024-01-27 DIAGNOSIS — M9903 Segmental and somatic dysfunction of lumbar region: Secondary | ICD-10-CM | POA: Diagnosis not present

## 2024-01-27 DIAGNOSIS — M5033 Other cervical disc degeneration, cervicothoracic region: Secondary | ICD-10-CM | POA: Diagnosis not present

## 2024-01-27 DIAGNOSIS — M9901 Segmental and somatic dysfunction of cervical region: Secondary | ICD-10-CM | POA: Diagnosis not present

## 2024-02-03 ENCOUNTER — Telehealth: Payer: Self-pay | Admitting: Family

## 2024-02-03 NOTE — Telephone Encounter (Signed)
 Called to confirm/remind patient of their appointment at the [facility] on 02/04/24.   Appointment:   [x] Confirmed  [] Left mess   [] No answer/No voice mail  [] VM Full/unable to leave message  [] Phone not in service  Patient reminded to bring all medications and/or complete list.  Confirmed patient has transportation. Gave directions, instructed to utilize valet parking.

## 2024-02-03 NOTE — Progress Notes (Unsigned)
 [facility] Note    PCP: Sowles, Krichna, MD  Primary Cardiologist: Perla Lye, MD / Abigail Motto, [place]  EP: Cindie Smalls, MD   Chief Complaint: fatigue   HPI:  Ms Kohlman is a 83 y/o female with a history of HTN, DM, hyperlipidemia, atrial fibrillation, GERD and chronic heart failure. DCCV on 11/30/2020 with recurrent Afib s/p repeat DCCV on amiodarone  01/20/2021, s/p repeat DCCV on 01/30/2021 and atrial flutter s/p ablation in 08/2022   Underwent LHC in 09/2005 which showed normal coronary arteries. Diagnosed with atrial flutter with RVR in 01/2015 after presenting for a total right knee replacement with spontaneous conversion to sinus rhythm. Echo in 09/2020 demonstrated an EF of 60 to 65%, no regional wall motion abnormalities, indeterminate LV diastolic function parameters, normal RV systolic function and ventricular cavity size, normal PASP, mildly dilated left atrium, and mild mitral regurgitation. Underwent DCCV on 11/30/2020 with conversion to sinus rhythm. 12/22/2020, she was noted to be back in Afib with controlled ventricular response. Loaded with amiodarone  and subsequently underwent repeat DCCV on 01/20/2021.   Admitted in 12/2020 with respiratory failure pulmonary edema in the setting of new LV dysfunction and recurrent A-fib.  Echo showed an EF of 45 to 50%, mild LVH, severe hypokinesis of the entire anterior and anteroseptal wall, moderately reduced RV systolic function with normal RV cavity size and mildly increased RV wall thickness, degenerative mitral valve with trivial regurgitation.  R/LHC showed mild to moderate, nonobstructive CAD including 30% mid LAD stenosis, 50 to 60% mid to distal LCx stenosis, and 60% proximal to mid RCA stenosis.  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  Moderately elevated right heart filling and  pulmonary artery pressures with significant pulmonary vascular resistance and mildly reduced cardiac output and index.  She was diuresed, placed on tolerated GDMT, and underwent successful repeat DCCV    Echo 03/31/21: EF of  60-65% without structural changes.   Recurrent A-fib in the fall 2023 with unsuccessful DCCV in the ED x 2. She was subsequently evaluated by EP and underwent successful A-fib/flutter ablation on 09/25/2022.   Cardiac CT 09/18/22: Small foci of subsegmental atelectasis at the anterior lung bases and in LEFT lower lobe.  Echo on 04/19/2023 showed an EF of 60 to 65%, no regional wall motion abnormalities, mild LVH, grade 2 diastolic dysfunction, normal RV systolic function and ventricular cavity size, mildly dilated left atrium, degenerative mitral valve with mild regurgitation, and aortic valve sclerosis without evidence of stenosis.  Saw cardiology 11/24 & EP 01/25.   Admitted 11/26/23 with chest pain. Found to be in AF RVR. 2 unsuccessful DCCV attempts in ED. Converted to NSR on amiodarone  drip. Troponin peaked at 145. Cardiology consulted. Myoview  scan negative for ischemia. Amiodarone  drip changed to oral. Echo 11/26/23: ejection fraction 65 to 70% with grade 3 diastolic dysfunction, mild pulmonary hypertension.   She presents today for a HF follow-up visit with a chief complaint of fatigue. Denies shortness of breath, chest pain, palpitations, abdominal distention, pedal edema, dizziness or change in sleeping pattern. Currently scheduled for ablation 09/25.   ROS: All systems negative except as listed in HPI, PMH and Problem List.  SH:  Social History   Socioeconomic History   Marital status: Married    Spouse name: Chalmer   Number of children: 1   Years of education: Not on file   Highest education  level: Master's degree (e.g., MA, MS, MEng, MEd, MSW, MBA)  Occupational History   Occupation: Retired  Tobacco Use   Smoking status: Never    Passive exposure: Never    Smokeless tobacco: Never   Tobacco comments:    Never smoke 10/23/22  Vaping Use   Vaping status: Never Used  Substance and Sexual Activity   Alcohol  use: Not Currently    Alcohol /week: 1.0 standard drink of alcohol     Types: 1 Glasses of wine per week    Comment: RARELY   Drug use: No   Sexual activity: Yes    Partners: Male    Birth control/protection: Post-menopausal  Other Topics Concern   Not on file  Social History Narrative   She is married , only has one son and he was diagnosed with colon cancer at age 89.    Social Drivers of Corporate investment banker Strain: Low Risk  (05/02/2023)   Overall Financial Resource Strain (CARDIA)    Difficulty of Paying Living Expenses: Not hard at all  Food Insecurity: No Food Insecurity (11/26/2023)   Hunger Vital Sign    Worried About Running Out of Food in the Last Year: Never true    Ran Out of Food in the Last Year: Never true  Transportation Needs: No Transportation Needs (11/26/2023)   PRAPARE - Administrator, Civil Service (Medical): No    Lack of Transportation (Non-Medical): No  Physical Activity: Sufficiently Active (05/02/2023)   Exercise Vital Sign    Days of Exercise per Week: 7 days    Minutes of Exercise per Session: 30 min  Stress: No Stress Concern Present (05/02/2023)   [employer] of Occupational Health - Occupational Stress Questionnaire    Feeling of Stress : Only a little  Social Connections: Socially Integrated (11/26/2023)   Social Connection and Isolation Panel    Frequency of Communication with Friends and Family: More than three times a week    Frequency of Social Gatherings with Friends and Family: More than three times a week    Attends Religious Services: More than 4 times per year    Active Member of [employer] or Organizations: Yes    Attends Engineer, structural: More than 4 times per year    Marital Status: Married  Catering manager Violence: Not At Risk (11/26/2023)    Humiliation, Afraid, Rape, and Kick questionnaire    Fear of Current or Ex-Partner: No    Emotionally Abused: No    Physically Abused: No    Sexually Abused: No    FH:  Family History  Problem Relation Age of Onset   Diabetes Mother    Hypertension Mother    Cancer Mother    Kidney disease Mother    Hypertension Father    Cancer Father        Prostate   Breast cancer Maternal Aunt 40   Colon cancer Son    Kidney cancer Neg Hx    Bladder Cancer Neg Hx     Past Medical History:  Diagnosis Date   Acute cystitis    Acute on chronic combined systolic and diastolic CHF (congestive heart failure) (HCC)    Acute respiratory failure with hypoxia (HCC) 01/23/2021   AF (paroxysmal atrial fibrillation) (HCC) 01/23/2021   Allergic rhinitis, cause unspecified    Arthritis 2015   Atherosclerosis of renal artery (HCC)    left   Atrial fibrillation (HCC)    Bronchitis, not specified as acute or chronic  Cancer (HCC) 12/2013   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; left shoulder blade   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   Cataract 2003   Cellulitis and abscess of leg, except foot    Conjunctivitis unspecified    Dermatophytosis of nail    Diabetes mellitus    type II   Elevated troponin    Esophageal reflux    Hyperlipidemia    Hypertension    Osteoporosis 2016   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     ALFALFA PO Take 1 tablet by mouth daily.     amiodarone  (PACERONE ) 200 MG tablet Take 1 tablet (200 mg total) by mouth daily. 90 tablet 3   ascorbic Acid  (VITAMIN C ) 500 MG CPCR Take 500 mg by mouth daily.     B Complex-C (B-COMPLEX WITH VITAMIN C ) tablet Take 1 tablet by  mouth daily. **SKAKLEE OSTEOMATRIX**     BD PEN NEEDLE NANO U/F 32G X 4 MM MISC USE 4 TIMES DAILY (HUMALOG  3 TIMES DAILY AND LANTUS  ONCE DAILY) OFFICE NOTIFIED 08/06/17 90 each 1   Calcium  Carbonate (CALCIUM  600 PO) Take 600 mg by mouth daily.     carvedilol  (COREG ) 6.25 MG tablet TAKE 1 TABLET BY MOUTH 2 TIMES DAILY WITH A MEAL. 180 tablet 3   cholecalciferol  (VITAMIN D ) 1000 UNITS tablet Take 1,000 Units by mouth daily. shaklee     clobetasol  ointment (TEMOVATE ) 0.05 % Apply 1 Application topically as needed (FOR BUG BITES).     Coenzyme Q10 (COQ10) 200 MG CAPS Take 200 mg by mouth daily.     Continuous Glucose Sensor (DEXCOM G7 SENSOR) MISC 1 each by Does not apply route as directed.     Elastic Bandages & Supports (MEDICAL COMPRESSION STOCKINGS) MISC 1 each by Does not apply route daily. 2 each 5   ELIQUIS  5 MG TABS tablet TAKE 1 TABLET BY MOUTH TWICE A DAY 180 tablet 1   empagliflozin  (JARDIANCE ) 25 MG TABS tablet Take 25 mg by mouth daily.     ENTRESTO  49-51 MG TAKE 1 TABLET BY MOUTH TWICE A DAY 180 tablet 3   estradiol  (ESTRACE ) 0.1 MG/GM vaginal cream APPLY 1/2 GM ONCE WEEKLY USING APPLICATOR, APPLY BLUEBERRY SIZED AMOUNT OF CREAM USING TIP OF FINGER TO URETHRA TWICE WEEKLY 42.5 g 3   HUMALOG  KWIKPEN 100 UNIT/ML KiwkPen Inject 2-6 Units into the skin 3 (three) times daily as needed (High blood sugar).  3   insulin  glargine (LANTUS ) 100 unit/mL SOPN Inject 13 Units into the skin in the morning.     Krill Oil 1000 MG CAPS Take 1,000 mg by mouth daily.     Mag Aspart-Potassium Aspart (POTASSIUM & MAGNESIUM  ASPARTAT PO) Take 1 tablet by mouth daily.     Multiple Vitamins-Minerals (PRESERVISION AREDS 2) CHEW Chew 1 each by mouth 2 (two) times daily.     nystatin  cream (MYCOSTATIN ) APPLY TO AFFECTED AREA TWICE A DAY 30 g 0   OVER THE COUNTER MEDICATION Place 1 application  into both eyes See admin instructions. Blephadex eyelid foam, apply to eyelids once daily     OVER THE COUNTER MEDICATION  Take 1 tablet by  mouth daily. Vita-Lea Gold otc supplement With out vitamin K (Shaklee products)     OVER THE COUNTER MEDICATION Take 1 tablet by mouth daily. Nutriferon otc supplement     Probiotic Product (PROBIOTIC PO) Take 1 capsule by mouth at bedtime.     PROLIA  60 MG/ML SOSY injection Inject 60 mg into the skin every 6 (six) months.     Propylene Glycol (SYSTANE BALANCE OP) Place 1 drop into both eyes daily as needed (dry eyes).     rosuvastatin  (CRESTOR ) 10 MG tablet TAKE 1 TABLET BY MOUTH EVERY DAY 90 tablet 0   Sodium Chloride -Sodium Bicarb (NETI POT SINUS WASH NA) Place 1 Dose into the nose at bedtime.     tirzepatide (MOUNJARO) 10 MG/0.5ML Pen Inject 10 mg into the skin once a week.     tiZANidine  (ZANAFLEX ) 2 MG tablet TAKE 1-2 TABLETS (2-4 MG TOTAL) BY MOUTH AT BEDTIME. (Patient taking differently: Take 4 mg by mouth at bedtime.) 180 tablet 1   torsemide  (DEMADEX ) 20 MG tablet Take 3 tablets (60 mg total) by mouth daily. 90 tablet 5   trimethoprim -polymyxin b  (POLYTRIM ) ophthalmic solution Place 1 drop into both eyes in the morning, at noon, in the evening, and at bedtime. 10 mL 0   Vitamin D , Ergocalciferol , (DRISDOL ) 1.25 MG (50000 UNIT) CAPS capsule Take 50,000 Units by mouth once a week.     No current facility-administered medications for this visit.   Vitals:   02/04/24 1052  BP: (!) 129/49  Pulse: (!) 53  SpO2: 100%  Weight: 144 lb (65.3 kg)   Wt Readings from Last 3 Encounters:  02/04/24 144 lb (65.3 kg)  12/25/23 147 lb 9.6 oz (67 kg)  12/03/23 145 lb 6.4 oz (66 kg)   Lab Results  Component Value Date   CREATININE 0.68 11/28/2023   CREATININE 0.69 11/27/2023   CREATININE 1.02 (H) 11/26/2023    PHYSICAL EXAM:  General: Well appearing.  Cor: No JVD. Regular rhythm, bradycardic.  Lungs: clear Abdomen: soft, nontender, nondistended. Extremities: trace pitting edema bilateral shins Neuro:. Affect pleasant   ECG: not done   ASSESSMENT & PLAN:  1:  NICM with preserved ejection fraction- - suspect due to atrial fibrillation - NYHA class II - euvolemic today - weighing daily; reminded to call for an overnight weight gain of > 2 pounds or a weekly weight gain of > 5 pounds.  - weight stable from her last visit here 2 months ago - Echo 01/23/21: EF of 45-50% with mild LVH, severe hypokinesis of left ventricle and trivial MR.  - Echo 03/31/21: EF of  60-65% without structural changes.  - Echo 04/19/2023: EF of 60 to 65%, no regional wall motion abnormalities, mild LVH, G2DD, normal RV systolic function and ventricular cavity size, mildly dilated left atrium, degenerative mitral valve with mild regurgitation, and aortic valve sclerosis without evidence of stenosis. - Echo 11/26/23: ejection fraction 65 to 70% with grade 3 diastolic dysfunction, mild pulmonary hypertension.  - is trying to walk between 3000-5000 steps daily - not adding salt and has been reading food labels - continue carvedilol  6.25mg  BID - continue jardiance  25mg  daily (DM dose) - continue entresto  49/51 BID - continue torsemide  60mg  daily - has had low BP in the past so may not be able to tolerate MRA - wearing compression socks daily and removes HS - BNP 04/11/22 was 347.5  2: HTN- - BP 129/49 - saw PCP (Sowles) 03/25 - BMP 11/28/23 reviewed: sodium 138, potassium  4.4, creatinine 0.68  & GFR >60. Will be getting lab work updated for upcoming ablation  3: DM- - A1c 09/12/23 was 6.6% - saw endocrinology Leatrice) 03/25  4: Paroxysmal atrial fibrillation- - unsuccessfully cardioverted twice 10/23 - ablation done 03/24 - unsuccessful DCCV X 2 05/25 with conversion to NSR after amiodarone  drip - saw EP Marny) 06/25 - has upcoming ablation 09/25 - continue eliquis  5mg  BID - continue amiodarone  200mg  QD - TSH 11/26/23 was 3.181 - will need routine eye exams  5: Nonobstructive CAD- - saw cardiology (Dunn) 11/24 - RHC/LHC done 01/25/21; Mild to moderate,  non-obstructive coronary artery disease, including 30% mid LAD disease, 50-60% mid/distal LCx stenosis, and 60% proximal/mid RCA lesion. Mildly to moderately reduced left ventricular systolic function with mid anterior hypo/akinesis; query atypical Takotsubo variant.  LVEF ~45%. Mildly elevated left heart filling pressures. Moderately elevated right heart filling and pulmonary artery pressures with significant pulmonary vascular resistance. Mildly reduced Fick cardiac output/index. - LDL 09/13/23 was 30 - continue rosuvastatin  10mg  daily   Return in 4 months, sooner if needed.   Ellouise DELENA Class, FNP 02/03/24

## 2024-02-04 ENCOUNTER — Encounter: Payer: Self-pay | Admitting: Family

## 2024-02-04 ENCOUNTER — Ambulatory Visit: Attending: Family | Admitting: Family

## 2024-02-04 VITALS — BP 129/49 | HR 53 | Wt 144.0 lb

## 2024-02-04 DIAGNOSIS — Z7901 Long term (current) use of anticoagulants: Secondary | ICD-10-CM | POA: Diagnosis not present

## 2024-02-04 DIAGNOSIS — Z7984 Long term (current) use of oral hypoglycemic drugs: Secondary | ICD-10-CM | POA: Diagnosis not present

## 2024-02-04 DIAGNOSIS — I10 Essential (primary) hypertension: Secondary | ICD-10-CM

## 2024-02-04 DIAGNOSIS — I5042 Chronic combined systolic (congestive) and diastolic (congestive) heart failure: Secondary | ICD-10-CM | POA: Insufficient documentation

## 2024-02-04 DIAGNOSIS — I272 Pulmonary hypertension, unspecified: Secondary | ICD-10-CM | POA: Insufficient documentation

## 2024-02-04 DIAGNOSIS — I2510 Atherosclerotic heart disease of native coronary artery without angina pectoris: Secondary | ICD-10-CM

## 2024-02-04 DIAGNOSIS — I428 Other cardiomyopathies: Secondary | ICD-10-CM | POA: Diagnosis not present

## 2024-02-04 DIAGNOSIS — Z79899 Other long term (current) drug therapy: Secondary | ICD-10-CM | POA: Diagnosis not present

## 2024-02-04 DIAGNOSIS — E1169 Type 2 diabetes mellitus with other specified complication: Secondary | ICD-10-CM

## 2024-02-04 DIAGNOSIS — I480 Paroxysmal atrial fibrillation: Secondary | ICD-10-CM

## 2024-02-04 DIAGNOSIS — E119 Type 2 diabetes mellitus without complications: Secondary | ICD-10-CM | POA: Diagnosis not present

## 2024-02-04 DIAGNOSIS — I110 Hypertensive heart disease with heart failure: Secondary | ICD-10-CM | POA: Diagnosis not present

## 2024-02-04 DIAGNOSIS — Z7985 Long-term (current) use of injectable non-insulin antidiabetic drugs: Secondary | ICD-10-CM | POA: Insufficient documentation

## 2024-02-04 DIAGNOSIS — E785 Hyperlipidemia, unspecified: Secondary | ICD-10-CM

## 2024-02-04 DIAGNOSIS — I5032 Chronic diastolic (congestive) heart failure: Secondary | ICD-10-CM | POA: Diagnosis not present

## 2024-02-04 DIAGNOSIS — Z794 Long term (current) use of insulin: Secondary | ICD-10-CM | POA: Diagnosis not present

## 2024-02-04 NOTE — Patient Instructions (Signed)
It was great to see you today.

## 2024-02-10 ENCOUNTER — Ambulatory Visit (INDEPENDENT_AMBULATORY_CARE_PROVIDER_SITE_OTHER): Admitting: Podiatry

## 2024-02-10 ENCOUNTER — Encounter: Payer: Self-pay | Admitting: Podiatry

## 2024-02-10 DIAGNOSIS — Q828 Other specified congenital malformations of skin: Secondary | ICD-10-CM | POA: Diagnosis not present

## 2024-02-10 DIAGNOSIS — M9903 Segmental and somatic dysfunction of lumbar region: Secondary | ICD-10-CM | POA: Diagnosis not present

## 2024-02-10 DIAGNOSIS — M5033 Other cervical disc degeneration, cervicothoracic region: Secondary | ICD-10-CM | POA: Diagnosis not present

## 2024-02-10 DIAGNOSIS — E119 Type 2 diabetes mellitus without complications: Secondary | ICD-10-CM | POA: Diagnosis not present

## 2024-02-10 DIAGNOSIS — M5416 Radiculopathy, lumbar region: Secondary | ICD-10-CM | POA: Diagnosis not present

## 2024-02-10 DIAGNOSIS — M9901 Segmental and somatic dysfunction of cervical region: Secondary | ICD-10-CM | POA: Diagnosis not present

## 2024-02-12 DIAGNOSIS — E1169 Type 2 diabetes mellitus with other specified complication: Secondary | ICD-10-CM | POA: Diagnosis not present

## 2024-02-12 DIAGNOSIS — E1165 Type 2 diabetes mellitus with hyperglycemia: Secondary | ICD-10-CM | POA: Diagnosis not present

## 2024-02-12 DIAGNOSIS — E1159 Type 2 diabetes mellitus with other circulatory complications: Secondary | ICD-10-CM | POA: Diagnosis not present

## 2024-02-12 DIAGNOSIS — M810 Age-related osteoporosis without current pathological fracture: Secondary | ICD-10-CM | POA: Diagnosis not present

## 2024-02-12 DIAGNOSIS — Z794 Long term (current) use of insulin: Secondary | ICD-10-CM | POA: Diagnosis not present

## 2024-02-12 DIAGNOSIS — I152 Hypertension secondary to endocrine disorders: Secondary | ICD-10-CM | POA: Diagnosis not present

## 2024-02-12 DIAGNOSIS — E785 Hyperlipidemia, unspecified: Secondary | ICD-10-CM | POA: Diagnosis not present

## 2024-02-12 LAB — HEMOGLOBIN A1C: Hemoglobin A1C: 6.5

## 2024-02-13 ENCOUNTER — Encounter: Payer: Self-pay | Admitting: Podiatry

## 2024-02-13 NOTE — Progress Notes (Signed)
  Subjective:  Patient ID: Bonnie Rowland, female    DOB: [birth year],  MRN: [id number]  Bonnie Rowland presents to clinic today for preventative diabetic foot care and painful porokeratotic lesions right lower extremity. Pain prevent(s) comfortable ambulation. Aggravating factor is weightbearing with and without shoegear.  Chief Complaint  Patient presents with   [facility]    Rm1 Diabetic Foot Care / Dr. Glenard last visit March 2025 / A1c 6.9/blood sugar 156   New problem(s): None.   PCP is Sowles, Krichna, MD.  Allergies  Allergen Reactions   Flexeril  [Cyclobenzaprine ] Hypertension   Cheese Other (See Comments)    bloating   Ciprofloxacin Hcl Other (See Comments)    Muscle pain   Coconut (Cocos Nucifera) Other (See Comments)    Upset stomach   Keflex [Cephalexin] Other (See Comments)    Patient prefers not to take this medication due to the side effects, caused tendon issues    Review of Systems: Negative except as noted in the HPI.  Objective: No changes noted in today's physical examination. There were no vitals filed for this visit. Bonnie Rowland is a pleasant 83 y.o. female WD, WN in NAD. AAO x 3.  Vascular Examination: Capillary refill time immediate b/l. Palpable pedal pulses. Pedal hair present b/l. No pain with calf compression b/l. Skin temperature gradient WNL b/l. No cyanosis or clubbing b/l. No ischemia or gangrene noted b/l.   Neurological Examination: Sensation grossly intact b/l with 10 gram monofilament. Vibratory sensation intact b/l.   Dermatological Examination: Pedal skin with normal turgor, texture and tone b/l.  No open wounds. No interdigital macerations.   Toenails 1-5 b/l well maintained with adequate length. No erythema, no edema, no drainage, no fluctuance. Porokeratotic lesion(s) plantarlateral midfoot right foot, plantar heel pad of right foot, and 5th met base right foot. No erythema, no edema, no drainage, no  fluctuance.  Musculoskeletal Examination: Muscle strength 5/5 to all lower extremity muscle groups bilaterally. Muscle strength 5/5 to all lower extremity muscle groups bilaterally. Plantarflexed metatarsal(s) 5th metatarsal head b/l lower extremities.. No pain, crepitus or joint limitation noted with ROM b/l LE.  Patient ambulates independently without assistive aids.  Radiographs: None  Assessment/Plan: 1. Porokeratosis   2. Diabetes mellitus without complication (HCC)     -Patient was evaluated today. All questions/concerns addressed on today's visit. -Patient to continue soft, supportive shoe gear daily. -Porokeratotic lesion(s) right heel, plantarlateral aspect of midfoot right foot, and 5th met base right lower extremity pared and enucleated with sterile currette without incident. Total number of lesions debrided=3. -Patient/POA to call should there be question/concern in the interim.   Return in about 3 months (around 05/12/2024).  Delon LITTIE Merlin, DPM      [facility] LOCATION: [address] [place] [address]                   Office [phone]   [facility] LOCATION: [address] [place], [place] [address] Office [phone]

## 2024-02-15 ENCOUNTER — Ambulatory Visit
Admission: RE | Admit: 2024-02-15 | Discharge: 2024-02-15 | Disposition: A | Discharge status: Home / Self Care | Source: Ambulatory Visit | Attending: Emergency Medicine | Admitting: Emergency Medicine

## 2024-02-15 VITALS — BP 129/70 | HR 62 | Temp 99.7°F | Resp 18

## 2024-02-15 DIAGNOSIS — U071 COVID-19: Secondary | ICD-10-CM | POA: Diagnosis not present

## 2024-02-15 DIAGNOSIS — B349 Viral infection, unspecified: Secondary | ICD-10-CM | POA: Diagnosis not present

## 2024-02-15 LAB — POC SOFIA SARS ANTIGEN FIA: SARS Coronavirus 2 Ag: POSITIVE — AB

## 2024-02-15 MED ORDER — MOLNUPIRAVIR EUA 200MG CAPSULE: 4.0000 | ORAL_CAPSULE | Freq: Two times a day (BID) | ORAL | 0 refills | Status: AC

## 2024-02-15 NOTE — ED Triage Notes (Signed)
 Patient complains of fever that started yesterday. Patient has had fever with the highest one being 101.8. Patient also complains of chills, cough, nasal congestion and head ache. Rates pain 6/10. Patient took Tylenol  1000 mg at 8 am with mild relief.

## 2024-02-15 NOTE — Discharge Instructions (Signed)
 Covid 19 is a virus and should steadily improve in time it can take up to 7 to 10 days before you truly start to see a turnaround however things will get better    Per the CDC you will need to quarantine and to your 24 hours without fever, if no fever may continue activity wearing mask  Take antiviral twice daily for 5 days to reduce the amount of virus in the body which helps to minimize symptoms, does not fully take away your illness  You can take Tylenol  and/or Ibuprofen as needed for fever reduction and pain relief.   For cough: honey 1/2 to 1 teaspoon (you can dilute the honey in water or another fluid).  You can also use guaifenesin  and dextromethorphan for cough. You can use a humidifier for chest congestion and cough.  If you don't have a humidifier, you can sit in the bathroom with the hot shower running.      For sore throat: try warm salt water gargles, cepacol lozenges, throat spray, warm tea or water with lemon/honey, popsicles or ice, or OTC cold relief medicine for throat discomfort.   For congestion: take a daily anti-histamine like Zyrtec, Claritin , and a oral decongestant, such as pseudoephedrine.  You can also use Flonase  1-2 sprays in each nostril daily.   It is important to stay hydrated: drink plenty of fluids (water, gatorade/powerade/pedialyte, juices, or teas) to keep your throat moisturized and help further relieve irritation/discomfort.

## 2024-02-15 NOTE — ED Provider Notes (Signed)
 [name]    CSN: [id number] Arrival date & time: 02/15/24  1410      History   Chief Complaint Chief Complaint  Patient presents with   Fever    Chills, congestion, aches, scratchy throatt - Entered by patient    HPI Bonnie Rowland is a 83 y.o. female.   Patient presents for evaluation of a fever peaking at 101, chills, nasal congestion, productive cough with clear sputum, sore throat, bilateral ischial pressure and mild shortness of breath.  Decreased appetite but tolerable to some food and liquids.  No known sick contact prior.  Has not attempted treatment.  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; left shoulder blade   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   Cataract 2003   Cellulitis and abscess of leg, except foot    Conjunctivitis unspecified    Dermatophytosis of nail    Diabetes mellitus    type II   Elevated troponin    Esophageal reflux    Hyperlipidemia    Hypertension    Osteoporosis 2016   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)    Renal artery stenosis (HCC)    Sprain of lumbar region    Thoracic or lumbosacral neuritis or radiculitis, unspecified    Urinary tract infection, site not specified     Patient Active Problem List   Diagnosis Date Noted   Paroxysmal atrial fibrillation with RVR (HCC) 11/27/2023   Atrial fibrillation with RVR (HCC) 11/26/2023   Nonobstructive coronary artery disease 11/26/2023   (HFpEF) heart failure with preserved ejection fraction (HCC) 11/26/2023    Hypokalemia 11/26/2023   Hypotension 11/26/2023   Non-ischemic cardiomyopathy (HCC) 09/13/2023   Muscle spasm of back 02/14/2023   Hypercoagulable state due to persistent atrial fibrillation (HCC) 10/23/2022   Rapid atrial fibrillation (HCC) 04/12/2022   Obesity (BMI 30-39.9)    Contusion of foot 03/08/2022   Pain due to onychomycosis of toenails of both feet 09/07/2021   Persistent atrial fibrillation (HCC)    Chronic combined systolic and diastolic congestive heart failure (HCC)    AF (paroxysmal atrial fibrillation) (HCC) 01/23/2021   DM (diabetes mellitus) (HCC) 01/23/2021   Plantar fasciitis, bilateral 05/21/2019   Age-related osteoporosis without current pathological fracture 11/05/2017   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(HCC) 07/09/2017   Atherosclerosis of abdominal aorta (HCC) 07/09/2017   Bilateral carotid bruits 08/24/2015   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   Chronic bilateral low back pain without sciatica 01/10/2015   History of melanoma excision 01/10/2015   Spinal stenosis, lumbar region, 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   DDD (degenerative disc disease), lumbar 11/14/2014   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   Hyperlipemia 11/07/2009   Atherosclerosis of renal artery (HCC) 11/07/2009   Perennial allergic rhinitis 11/27/2007   Lumbosacral neuritis 01/17/2007    Past Surgical History:  Procedure Laterality Date   APPENDECTOMY     ATRIAL FIBRILLATION ABLATION N/A 09/25/2022   Procedure: ATRIAL FIBRILLATION ABLATION;  Surgeon: Cindie Ole DASEN, MD;  Location: [facility] INVASIVE  CV LAB;  Service: Cardiovascular;  Laterality: N/A;   BIOPSY  02/28/2023   Procedure: BIOPSY;  Surgeon: Onita Elspeth Sharper, DO;  Location: [facility] ENDOSCOPY;  Service: Gastroenterology;;   CARDIAC CATHETERIZATION     CARDIOVERSION N/A 11/30/2020   Procedure: CARDIOVERSION;  Surgeon: Perla Evalene PARAS, MD;  Location: [facility] ORS;  Service: Cardiovascular;  Laterality: N/A;   CARDIOVERSION N/A 01/20/2021    Procedure: CARDIOVERSION;  Surgeon: Perla Evalene PARAS, MD;  Location: [facility] ORS;  Service: Cardiovascular;  Laterality: N/A;   CARDIOVERSION N/A 01/30/2021   Procedure: CARDIOVERSION;  Surgeon: Darron Deatrice LABOR, MD;  Location: [facility] ORS;  Service: Cardiovascular;  Laterality: N/A;   cataract surgery     COLONOSCOPY     COLONOSCOPY N/A 02/28/2023   Procedure: COLONOSCOPY;  Surgeon: Onita Elspeth Sharper, DO;  Location: [facility] ENDOSCOPY;  Service: Gastroenterology;  Laterality: N/A;   COLONOSCOPY WITH PROPOFOL  N/A 05/09/2016   Procedure: COLONOSCOPY WITH PROPOFOL ;  Surgeon: Reyes LELON Cota, MD;  Location: [facility] ENDOSCOPY;  Service: Endoscopy;  Laterality: N/A;   DIAGNOSTIC MAMMOGRAM     EYE SURGERY Bilateral    Cataract Extraction   JOINT REPLACEMENT  2016   KNEE ARTHROPLASTY Right 03/30/2015   Procedure: COMPUTER ASSISTED TOTAL KNEE ARTHROPLASTY;  Surgeon: Lynwood SHAUNNA Hue, MD;  Location: [facility] ORS;  Service: Orthopedics;  Laterality: Right;   KNEE ARTHROSCOPY Right    KNEE CLOSED REDUCTION Right 05/23/2015   Procedure: CLOSED MANIPULATION KNEE;  Surgeon: Lynwood SHAUNNA Hue, MD;  Location: [facility] ORS;  Service: Orthopedics;  Laterality: Right;   POLYPECTOMY  02/28/2023   Procedure: POLYPECTOMY;  Surgeon: Onita Elspeth Sharper, DO;  Location: [facility] ENDOSCOPY;  Service: Gastroenterology;;   RIGHT/LEFT HEART CATH AND CORONARY ANGIOGRAPHY N/A 01/25/2021   Procedure: RIGHT/LEFT HEART CATH AND CORONARY ANGIOGRAPHY;  Surgeon: Mady Bruckner, MD;  Location: [facility] INVASIVE CV LAB;  Service: Cardiovascular;  Laterality: N/A;   TONSILLECTOMY      OB History   No obstetric history on file.      Home Medications    Prior to Admission medications   Medication Sig Start Date End Date Taking? Authorizing Provider  molnupiravir  EUA (LAGEVRIO ) 200 mg CAPS capsule Take 4 capsules (800 mg total) by mouth 2 (two) times daily for 5 days. 02/15/24 02/20/24 Yes Francois Elk R, NP  acetaminophen  (TYLENOL ) 500 MG tablet  Take 1,000 mg by mouth at bedtime.    [provider]  ALFALFA PO Take 1 tablet by mouth daily.    [provider]  amiodarone  (PACERONE ) 200 MG tablet Take 1 tablet (200 mg total) by mouth daily. 01/13/24   Cindie Ole DASEN, MD  ascorbic Acid  (VITAMIN C ) 500 MG CPCR Take 500 mg by mouth daily.    [provider]  B Complex-C (B-COMPLEX WITH VITAMIN C ) tablet Take 1 tablet by mouth daily. **SKAKLEE OSTEOMATRIX**    [provider]  BD PEN NEEDLE NANO U/F 32G X 4 MM MISC USE 4 TIMES DAILY (HUMALOG  3 TIMES DAILY AND LANTUS  ONCE DAILY) OFFICE NOTIFIED 08/06/17 12/07/18   Sowles, Krichna, MD  Calcium  Carbonate (CALCIUM  600 PO) Take 600 mg by mouth daily.    [provider]  carvedilol  (COREG ) 6.25 MG tablet TAKE 1 TABLET BY MOUTH 2 TIMES DAILY WITH A MEAL. 07/19/23   Loistine Sober, NP  cholecalciferol  (VITAMIN D ) 1000 UNITS tablet Take 1,000 Units by mouth daily. shaklee    [provider]  clobetasol  ointment (TEMOVATE ) 0.05 % Apply 1 Application topically as needed (FOR BUG  BITES).    [provider]  Coenzyme Q10 (COQ10) 200 MG CAPS Take 200 mg by mouth daily.    [provider]  Continuous Glucose Sensor (DEXCOM G7 SENSOR) MISC 1 each by Does not apply route as directed.    [provider]  Elastic Bandages & Supports (MEDICAL COMPRESSION STOCKINGS) MISC 1 each by Does not apply route daily. 01/08/19   Sowles, Krichna, MD  ELIQUIS  5 MG TABS tablet TAKE 1 TABLET BY MOUTH TWICE A DAY 09/11/23   Cindie Ole DASEN, MD  empagliflozin  (JARDIANCE ) 25 MG TABS tablet Take 25 mg by mouth daily. 08/26/22   Cherilyn Debby CROME, MD  ENTRESTO  49-51 MG TAKE 1 TABLET BY MOUTH TWICE A DAY 12/05/23   [place] A, FNP  estradiol  (ESTRACE ) 0.1 MG/GM vaginal cream APPLY 1/2 GM ONCE WEEKLY USING APPLICATOR, APPLY BLUEBERRY SIZED AMOUNT OF CREAM USING TIP OF FINGER TO URETHRA TWICE WEEKLY 01/18/23   McGowan, Clotilda A, [name]  HUMALOG  KWIKPEN  100 UNIT/ML KiwkPen Inject 2-6 Units into the skin 3 (three) times daily as needed (High blood sugar). 10/15/17   [provider]  insulin  glargine (LANTUS ) 100 unit/mL SOPN Inject 13 Units into the skin in the morning.    [provider]  Anselm Oil 1000 MG CAPS Take 1,000 mg by mouth daily.    [provider]  Mag Aspart-Potassium Aspart (POTASSIUM & MAGNESIUM  ASPARTAT PO) Take 1 tablet by mouth daily.    [provider]  Multiple Vitamins-Minerals (PRESERVISION AREDS 2) CHEW Chew 1 each by mouth 2 (two) times daily.    [provider]  nystatin  cream (MYCOSTATIN ) APPLY TO AFFECTED AREA TWICE A DAY 12/25/23   McGowan, Clotilda A, [name]  OVER THE COUNTER MEDICATION Place 1 application  into both eyes See admin instructions. Blephadex eyelid foam, apply to eyelids once daily    [provider]  OVER THE COUNTER MEDICATION Take 1 tablet by mouth daily. Vita-Lea Gold otc supplement With out vitamin K (Shaklee products)    [provider]  OVER THE COUNTER MEDICATION Take 1 tablet by mouth daily. Nutriferon otc supplement    [provider]  Probiotic Product (PROBIOTIC PO) Take 1 capsule by mouth at bedtime.    [provider]  PROLIA  60 MG/ML SOSY injection Inject 60 mg into the skin every 6 (six) months. 09/02/19   [provider]  Propylene Glycol (SYSTANE BALANCE OP) Place 1 drop into both eyes daily as needed (dry eyes).    [provider]  rosuvastatin  (CRESTOR ) 10 MG tablet TAKE 1 TABLET BY MOUTH EVERY DAY 12/05/23   Sowles, Krichna, MD  Sodium Chloride -Sodium Bicarb (NETI POT SINUS WASH NA) Place 1 Dose into the nose at bedtime.    [provider]  tirzepatide CLOYDE) 10 MG/0.5ML Pen Inject 10 mg into the skin once a week. 08/23/22   Cherilyn Debby CROME, MD  tiZANidine  (ZANAFLEX ) 2 MG tablet TAKE 1-2 TABLETS (2-4 MG TOTAL) BY MOUTH AT BEDTIME. 11/04/23   Glenard, Krichna, MD  torsemide  (DEMADEX )  20 MG tablet Take 3 tablets (60 mg total) by mouth daily. 10/29/23   [place] LABOR, FNP  trimethoprim -polymyxin b  (POLYTRIM ) ophthalmic solution Place 1 drop into both eyes in the morning, at noon, in the evening, and at bedtime. 08/08/23   Moishe [name] HERO, NP  Vitamin D , Ergocalciferol , (DRISDOL ) 1.25 MG (50000 UNIT) CAPS capsule Take 50,000 Units by mouth once a week. 10/02/23   [provider]  doxycycline  (VIBRA -TABS) 100 MG tablet Take 1 tablet (100 mg total) by mouth 2 (two) times daily. 01/16/22   Loreda Hacker, DPM    Family History Family History  Problem Relation Age of Onset   Diabetes Mother    Hypertension Mother    Cancer Mother    Kidney disease Mother    Hypertension Father    Cancer Father        Prostate   Breast cancer Maternal Aunt 44   Colon cancer Son    Kidney cancer Neg Hx    Bladder Cancer Neg Hx     Social History Social History   Tobacco Use   Smoking status: Never    Passive exposure: Never   Smokeless tobacco: Never   Tobacco comments:    Never smoke 10/23/22  Vaping Use   Vaping status: Never Used  Substance Use Topics   Alcohol  use: Not Currently    Alcohol /week: 1.0 standard drink of alcohol     Types: 1 Glasses of wine per week    Comment: RARELY   Drug use: No     Allergies   Flexeril  [cyclobenzaprine ], Cheese, Ciprofloxacin hcl, Coconut (cocos nucifera), and Keflex [cephalexin]   Review of Systems Review of Systems   Physical Exam Triage Vital Signs ED Triage Vitals  Encounter Vitals Group     BP 02/15/24 1430 129/70     Girls Systolic BP Percentile --      Girls Diastolic BP Percentile --      Boys Systolic BP Percentile --      Boys Diastolic BP Percentile --      Pulse Rate 02/15/24 1430 62     Resp 02/15/24 1430 18     Temp 02/15/24 1430 99.7 F (37.6 C)     Temp Source 02/15/24 1430 Oral     SpO2 02/15/24 1430 97 %     Weight --      Height --      Head Circumference --      Peak Flow --      Pain Score  02/15/24 1434 6     Pain Loc --      Pain Education --      Exclude from Growth Chart --    No data found.  Updated Vital Signs BP 129/70 (BP Location: Right Arm)   Pulse 62   Temp 99.7 F (37.6 C) (Oral)   Resp 18   SpO2 97%   Visual Acuity Right Eye Distance:   Left Eye Distance:   Bilateral Distance:    Right Eye Near:   Left Eye Near:    Bilateral Near:     Physical Exam Constitutional:      Appearance: Normal appearance.  HENT:     Head: Normocephalic.     Right Ear: Tympanic membrane, ear canal and external ear normal.     Left Ear: Tympanic membrane, ear canal and external ear normal.     Nose: Congestion present.     Mouth/Throat:     Mouth: Mucous membranes are moist.     Pharynx: Oropharynx is clear. No oropharyngeal exudate or posterior oropharyngeal erythema.  Eyes:     Extraocular Movements: Extraocular movements intact.  Cardiovascular:     Rate and Rhythm: Normal rate and regular rhythm.     Pulses: Normal pulses.     Heart sounds: Normal heart sounds.  Pulmonary:     Effort: Pulmonary effort is normal.     Breath sounds: Normal breath  sounds.  Musculoskeletal:     Cervical back: Normal range of motion and neck supple.  Neurological:     Mental Status: She is alert and oriented to person, place, and time. Mental status is at baseline.      UC Treatments / Results  Labs (all labs ordered are listed, but only abnormal results are displayed) Labs Reviewed  POCT RAPID STREP A (OFFICE)  POC SOFIA SARS ANTIGEN FIA    EKG   Radiology No results found.  Procedures Procedures (including critical care time)  Medications Ordered in UC Medications - No data to display  Initial Impression / Assessment and Plan / UC Course  I have reviewed the triage vital signs and the nursing notes.  Pertinent labs & imaging results that were available during my care of the patient were reviewed by me and considered in my medical decision making (see chart  for details).  COVID-19, viral illness  Patient is in no signs of distress nor toxic appearing.  Vital signs are stable.  Low suspicion for pneumonia, pneumothorax or bronchitis and therefore will defer imaging.  Discussed quarantine, prescribed antiviral and discussed administration.  Declined prescription for steroids and cough medicine. May use additional over-the-counter medications as needed for supportive care.  May follow-up with urgent care as needed if symptoms persist or worsen.  Final Clinical Impressions(s) / UC Diagnoses   Final diagnoses:  Viral illness  COVID-19     Discharge Instructions      Covid 19 is a virus and should steadily improve in time it can take up to 7 to 10 days before you truly start to see a turnaround however things will get better    Per the CDC you will need to quarantine and to your 24 hours without fever, if no fever may continue activity wearing mask  Take antiviral twice daily for 5 days to reduce the amount of virus in the body which helps to minimize symptoms, does not fully take away your illness  You can take Tylenol  and/or Ibuprofen as needed for fever reduction and pain relief.   For cough: honey 1/2 to 1 teaspoon (you can dilute the honey in water or another fluid).  You can also use guaifenesin  and dextromethorphan for cough. You can use a humidifier for chest congestion and cough.  If you don't have a humidifier, you can sit in the bathroom with the hot shower running.      For sore throat: try warm salt water gargles, cepacol lozenges, throat spray, warm tea or water with lemon/honey, popsicles or ice, or OTC cold relief medicine for throat discomfort.   For congestion: take a daily anti-histamine like Zyrtec, Claritin , and a oral decongestant, such as pseudoephedrine.  You can also use Flonase  1-2 sprays in each nostril daily.   It is important to stay hydrated: drink plenty of fluids (water, gatorade/powerade/pedialyte, juices, or  teas) to keep your throat moisturized and help further relieve irritation/discomfort.    ED Prescriptions     Medication Sig Dispense Auth. Provider   molnupiravir  EUA (LAGEVRIO ) 200 mg CAPS capsule Take 4 capsules (800 mg total) by mouth 2 (two) times daily for 5 days. 40 capsule Mell Mellott R, NP      PDMP not reviewed this encounter.   Teresa Shelba SAUNDERS, NP 02/15/24 1459

## 2024-02-17 ENCOUNTER — Telehealth: Payer: Self-pay

## 2024-02-17 DIAGNOSIS — Z79899 Other long term (current) drug therapy: Secondary | ICD-10-CM | POA: Diagnosis not present

## 2024-02-17 DIAGNOSIS — I4819 Other persistent atrial fibrillation: Secondary | ICD-10-CM | POA: Diagnosis not present

## 2024-02-17 NOTE — Telephone Encounter (Signed)
 Called pt and went over CT/Ablation Instructions. She went today 8/18 to [facility] to have labs done.   CT is scheduled on 8/22 at 10:00 am. Ablation is scheduled on 9/12 at 10:00 am   Pt was Dx with Covid on 8/16 but is starting to feel better now. She called the [facility] office before going in for labs today and was told it was ok to come in.   I will mail her Ablation Instructions to her per her request.

## 2024-02-18 LAB — CBC
Hematocrit: 38.7 % (ref 34.0–46.6)
Hemoglobin: 12.9 g/dL (ref 11.1–15.9)
MCH: 33 pg (ref 26.6–33.0)
MCHC: 33.3 g/dL (ref 31.5–35.7)
MCV: 99 fL — ABNORMAL HIGH (ref 79–97)
Platelets: 120 x10E3/uL — ABNORMAL LOW (ref 150–450)
RBC: 3.91 x10E6/uL (ref 3.77–5.28)
RDW: 12.6 % (ref 11.7–15.4)
WBC: 5.6 x10E3/uL (ref 3.4–10.8)

## 2024-02-18 LAB — BASIC METABOLIC PANEL WITH GFR
BUN/Creatinine Ratio: 19 (ref 12–28)
BUN: 24 mg/dL (ref 8–27)
CO2: 25 mmol/L (ref 20–29)
Calcium: 8.1 mg/dL — ABNORMAL LOW (ref 8.7–10.3)
Chloride: 96 mmol/L (ref 96–106)
Creatinine, Ser: 1.24 mg/dL — ABNORMAL HIGH (ref 0.57–1.00)
Glucose: 165 mg/dL — ABNORMAL HIGH (ref 70–99)
Potassium: 3.3 mmol/L — ABNORMAL LOW (ref 3.5–5.2)
Sodium: 137 mmol/L (ref 134–144)
eGFR: 44 mL/min/1.73 — ABNORMAL LOW (ref >59)

## 2024-02-19 ENCOUNTER — Other Ambulatory Visit: Payer: Self-pay

## 2024-02-19 ENCOUNTER — Ambulatory Visit: Payer: Self-pay | Admitting: Cardiology

## 2024-02-19 DIAGNOSIS — I480 Paroxysmal atrial fibrillation: Secondary | ICD-10-CM

## 2024-02-19 DIAGNOSIS — I10 Essential (primary) hypertension: Secondary | ICD-10-CM

## 2024-02-19 DIAGNOSIS — I5032 Chronic diastolic (congestive) heart failure: Secondary | ICD-10-CM

## 2024-02-21 ENCOUNTER — Ambulatory Visit
Admission: RE | Admit: 2024-02-21 | Discharge: 2024-02-21 | Disposition: A | Discharge status: Home / Self Care | Source: Ambulatory Visit | Attending: Cardiology | Admitting: Cardiology

## 2024-02-21 DIAGNOSIS — Z79899 Other long term (current) drug therapy: Secondary | ICD-10-CM | POA: Insufficient documentation

## 2024-02-21 DIAGNOSIS — I4819 Other persistent atrial fibrillation: Secondary | ICD-10-CM | POA: Diagnosis not present

## 2024-02-21 MED ORDER — IOHEXOL 350 MG/ML SOLN
75.0000 mL | Freq: Once | INTRAVENOUS | Status: AC | PRN
  Administered 2024-02-21: 75 mL via INTRAVENOUS

## 2024-02-21 MED ORDER — SODIUM CHLORIDE 0.9 % IV BOLUS: 150.0000 mL | Freq: Once | INTRAVENOUS | Status: DC

## 2024-02-24 ENCOUNTER — Ambulatory Visit
Admission: RE | Admit: 2024-02-24 | Discharge: 2024-02-24 | Disposition: A | Discharge status: Home / Self Care | Payer: Self-pay | Source: Ambulatory Visit | Attending: Emergency Medicine | Admitting: Emergency Medicine

## 2024-02-24 VITALS — BP 107/75 | HR 79 | Temp 98.3°F | Resp 18

## 2024-02-24 DIAGNOSIS — J069 Acute upper respiratory infection, unspecified: Secondary | ICD-10-CM | POA: Diagnosis not present

## 2024-02-24 MED ORDER — BENZONATATE 100 MG PO CAPS: 100.0000 mg | ORAL_CAPSULE | Freq: Three times a day (TID) | ORAL | 0 refills | Status: DC

## 2024-02-24 MED ORDER — PSEUDOEPH-BROMPHEN-DM 30-2-10 MG/5ML PO SYRP: 5.0000 mL | ORAL_SOLUTION | Freq: Four times a day (QID) | ORAL | 0 refills | Status: DC | PRN

## 2024-02-24 MED ORDER — AMOXICILLIN-POT CLAVULANATE 875-125 MG PO TABS: 1.0000 | ORAL_TABLET | Freq: Two times a day (BID) | ORAL | 0 refills | Status: DC

## 2024-02-24 MED ORDER — PSEUDOEPH-BROMPHEN-DM 30-2-10 MG/5ML PO SYRP: 2.5000 mL | ORAL_SOLUTION | Freq: Four times a day (QID) | ORAL | 0 refills | Status: DC | PRN

## 2024-02-24 NOTE — ED Provider Notes (Signed)
 [name]    CSN: [id number] Arrival date & time: 02/24/24  9148      History   Chief Complaint Chief Complaint  Patient presents with   Cough    Chest congestion after covid - Entered by patient   Fever    HPI Bonnie Rowland is a 83 y.o. female.   Patient presents for evaluation of persisting nasal congestion, chest congestion and a nonproductive cough present for 10 days.  Experiencing shortness of breath and wheezing only with coughing.  Diagnosed with COVID on 02/15/2024, unable to tolerate antiviral therefore discontinued use.  Additionally has taken Tylenol .  Tolerable to food and liquids.  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; left shoulder blade   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   Cataract 2003   Cellulitis and abscess of leg, except foot    Conjunctivitis unspecified    Dermatophytosis of nail    Diabetes mellitus    type II   Elevated troponin    Esophageal reflux    Hyperlipidemia    Hypertension    Osteoporosis 2016   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)    Renal artery stenosis (HCC)    Sprain of lumbar region    Thoracic or lumbosacral neuritis or radiculitis, unspecified    Urinary tract infection, site not specified     Patient Active Problem List   Diagnosis Date Noted   Paroxysmal atrial fibrillation with RVR (HCC) 11/27/2023   Atrial fibrillation with RVR (HCC) 11/26/2023   Nonobstructive coronary artery disease 11/26/2023   (HFpEF) heart failure with preserved ejection  fraction (HCC) 11/26/2023   Hypokalemia 11/26/2023   Hypotension 11/26/2023   Non-ischemic cardiomyopathy (HCC) 09/13/2023   Muscle spasm of back 02/14/2023   Hypercoagulable state due to persistent atrial fibrillation (HCC) 10/23/2022   Rapid atrial fibrillation (HCC) 04/12/2022   Obesity (BMI 30-39.9)    Contusion of foot 03/08/2022   Pain due to onychomycosis of toenails of both feet 09/07/2021   Persistent atrial fibrillation (HCC)    Chronic combined systolic and diastolic congestive heart failure (HCC)    AF (paroxysmal atrial fibrillation) (HCC) 01/23/2021   DM (diabetes mellitus) (HCC) 01/23/2021   Plantar fasciitis, bilateral 05/21/2019   Age-related osteoporosis without current pathological fracture 11/05/2017   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(HCC) 07/09/2017   Atherosclerosis of abdominal aorta (HCC) 07/09/2017   Bilateral carotid bruits 08/24/2015   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   Chronic bilateral low back pain without sciatica 01/10/2015   History of melanoma excision 01/10/2015   Spinal stenosis, lumbar region, 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   DDD (degenerative disc disease), lumbar 11/14/2014   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   Hyperlipemia 11/07/2009   Atherosclerosis of renal artery (HCC) 11/07/2009   Perennial allergic rhinitis 11/27/2007   Lumbosacral neuritis 01/17/2007    Past Surgical History:  Procedure Laterality Date   APPENDECTOMY     ATRIAL FIBRILLATION ABLATION N/A 09/25/2022   Procedure: ATRIAL FIBRILLATION ABLATION;  Surgeon: Cindie Ole DASEN,  MD;  Location: [facility];  Service: Cardiovascular;  Laterality: N/A;   BIOPSY  02/28/2023   Procedure: BIOPSY;  Surgeon: Onita Elspeth Sharper, DO;  Location: [facility];  Service: Gastroenterology;;   CARDIAC CATHETERIZATION     CARDIOVERSION N/A 11/30/2020   Procedure: CARDIOVERSION;  Surgeon: Perla Evalene PARAS, MD;  Location: [facility];  Service: Cardiovascular;  Laterality: N/A;    CARDIOVERSION N/A 01/20/2021   Procedure: CARDIOVERSION;  Surgeon: Perla Evalene PARAS, MD;  Location: [facility];  Service: Cardiovascular;  Laterality: N/A;   CARDIOVERSION N/A 01/30/2021   Procedure: CARDIOVERSION;  Surgeon: Darron Deatrice LABOR, MD;  Location: [facility];  Service: Cardiovascular;  Laterality: N/A;   cataract surgery     COLONOSCOPY     COLONOSCOPY N/A 02/28/2023   Procedure: COLONOSCOPY;  Surgeon: Onita Elspeth Sharper, DO;  Location: [facility];  Service: Gastroenterology;  Laterality: N/A;   COLONOSCOPY WITH PROPOFOL  N/A 05/09/2016   Procedure: COLONOSCOPY WITH PROPOFOL ;  Surgeon: Reyes LELON Cota, MD;  Location: [facility];  Service: Endoscopy;  Laterality: N/A;   DIAGNOSTIC MAMMOGRAM     EYE SURGERY Bilateral    Cataract Extraction   JOINT REPLACEMENT  2016   KNEE ARTHROPLASTY Right 03/30/2015   Procedure: COMPUTER ASSISTED TOTAL KNEE ARTHROPLASTY;  Surgeon: Lynwood SHAUNNA Hue, MD;  Location: [facility];  Service: Orthopedics;  Laterality: Right;   KNEE ARTHROSCOPY Right    KNEE CLOSED REDUCTION Right 05/23/2015   Procedure: CLOSED MANIPULATION KNEE;  Surgeon: Lynwood SHAUNNA Hue, MD;  Location: [facility];  Service: Orthopedics;  Laterality: Right;   POLYPECTOMY  02/28/2023   Procedure: POLYPECTOMY;  Surgeon: Onita Elspeth Sharper, DO;  Location: [facility];  Service: Gastroenterology;;   RIGHT/LEFT HEART CATH AND CORONARY ANGIOGRAPHY N/A 01/25/2021   Procedure: RIGHT/LEFT HEART CATH AND CORONARY ANGIOGRAPHY;  Surgeon: Mady Bruckner, MD;  Location: [facility];  Service: Cardiovascular;  Laterality: N/A;   TONSILLECTOMY      OB History   No obstetric history on file.      Home Medications    Prior to Admission medications   Medication Sig Start Date End Date Taking? Authorizing Provider  amoxicillin -clavulanate (AUGMENTIN ) 875-125 MG tablet Take 1 tablet by mouth every 12 (twelve) hours. 02/24/24  Yes Iwao Shamblin R, NP  benzonatate  (TESSALON ) 100 MG  capsule Take 1 capsule (100 mg total) by mouth every 8 (eight) hours. 02/24/24  Yes Trey Gulbranson R, NP  acetaminophen  (TYLENOL ) 500 MG tablet Take 1,000 mg by mouth at bedtime.    [provider]  ALFALFA PO Take 1 tablet by mouth daily.    [provider]  amiodarone  (PACERONE ) 200 MG tablet Take 1 tablet (200 mg total) by mouth daily. 01/13/24   Cindie Ole DASEN, MD  ascorbic Acid  (VITAMIN C ) 500 MG CPCR Take 500 mg by mouth daily.    [provider]  B Complex-C (B-COMPLEX WITH VITAMIN C ) tablet Take 1 tablet by mouth daily. **SKAKLEE OSTEOMATRIX**    [provider]  BD PEN NEEDLE NANO U/F 32G X 4 MM MISC USE 4 TIMES DAILY (HUMALOG  3 TIMES DAILY AND LANTUS  ONCE DAILY) OFFICE NOTIFIED 08/06/17 12/07/18   Sowles, Krichna, MD  brompheniramine-pseudoephedrine-DM 30-2-10 MG/5ML syrup Take 5 mLs by mouth 4 (four) times daily as needed. 02/24/24   Teresa Shelba SAUNDERS, NP  Calcium  Carbonate (CALCIUM  600 PO) Take 600 mg by mouth daily.    [provider]  carvedilol  (COREG ) 6.25 MG tablet TAKE 1 TABLET BY MOUTH 2  TIMES DAILY WITH A MEAL. 07/19/23   Loistine Sober, NP  cholecalciferol  (VITAMIN D ) 1000 UNITS tablet Take 1,000 Units by mouth daily. shaklee    [provider]  clobetasol  ointment (TEMOVATE ) 0.05 % Apply 1 Application topically as needed (FOR BUG BITES).    [provider]  Coenzyme Q10 (COQ10) 200 MG CAPS Take 200 mg by mouth daily.    [provider]  Continuous Glucose Sensor (DEXCOM G7 SENSOR) MISC 1 each by Does not apply route as directed.    [provider]  Elastic Bandages & Supports (MEDICAL COMPRESSION STOCKINGS) MISC 1 each by Does not apply route daily. 01/08/19   Sowles, Krichna, MD  ELIQUIS  5 MG TABS tablet TAKE 1 TABLET BY MOUTH TWICE A DAY 09/11/23   Cindie Ole DASEN, MD  empagliflozin  (JARDIANCE ) 25 MG TABS tablet Take 25 mg by mouth daily. 08/26/22   Cherilyn Debby CROME, MD  ENTRESTO  49-51 MG  TAKE 1 TABLET BY MOUTH TWICE A DAY 12/05/23   [place] A, FNP  estradiol  (ESTRACE ) 0.1 MG/GM vaginal cream APPLY 1/2 GM ONCE WEEKLY USING APPLICATOR, APPLY BLUEBERRY SIZED AMOUNT OF CREAM USING TIP OF FINGER TO URETHRA TWICE WEEKLY 01/18/23   McGowan, Clotilda A, [name]  HUMALOG  KWIKPEN 100 UNIT/ML KiwkPen Inject 2-6 Units into the skin 3 (three) times daily as needed (High blood sugar). 10/15/17   [provider]  insulin  glargine (LANTUS ) 100 unit/mL SOPN Inject 13 Units into the skin in the morning.    [provider]  Anselm Oil 1000 MG CAPS Take 1,000 mg by mouth daily.    [provider]  Mag Aspart-Potassium Aspart (POTASSIUM & MAGNESIUM  ASPARTAT PO) Take 1 tablet by mouth daily.    [provider]  Multiple Vitamins-Minerals (PRESERVISION AREDS 2) CHEW Chew 1 each by mouth 2 (two) times daily.    [provider]  nystatin  cream (MYCOSTATIN ) APPLY TO AFFECTED AREA TWICE A DAY 12/25/23   McGowan, Clotilda A, [name]  OVER THE COUNTER MEDICATION Place 1 application  into both eyes See admin instructions. Blephadex eyelid foam, apply to eyelids once daily    [provider]  OVER THE COUNTER MEDICATION Take 1 tablet by mouth daily. Vita-Lea Gold otc supplement With out vitamin K (Shaklee products)    [provider]  OVER THE COUNTER MEDICATION Take 1 tablet by mouth daily. Nutriferon otc supplement    [provider]  Probiotic Product (PROBIOTIC PO) Take 1 capsule by mouth at bedtime.    [provider]  PROLIA  60 MG/ML SOSY injection Inject 60 mg into the skin every 6 (six) months. 09/02/19   [provider]  Propylene Glycol (SYSTANE BALANCE OP) Place 1 drop into both eyes daily as needed (dry eyes).    [provider]  rosuvastatin  (CRESTOR ) 10 MG tablet TAKE 1 TABLET BY MOUTH EVERY DAY 12/05/23   Sowles, Krichna, MD  Sodium Chloride -Sodium Bicarb (NETI POT SINUS WASH NA) Place 1 Dose into the nose at  bedtime.    [provider]  tirzepatide CLOYDE) 10 MG/0.5ML Pen Inject 10 mg into the skin once a week. 08/23/22   Cherilyn Debby CROME, MD  tiZANidine  (ZANAFLEX ) 2 MG tablet TAKE 1-2 TABLETS (2-4 MG TOTAL) BY MOUTH AT BEDTIME. 11/04/23   Glenard, Krichna, MD  torsemide  (DEMADEX ) 20 MG tablet Take 3 tablets (60 mg total) by mouth daily. 10/29/23   [place] LABOR, FNP  trimethoprim -polymyxin b  (POLYTRIM ) ophthalmic solution Place 1 drop into both  eyes in the morning, at noon, in the evening, and at bedtime. 08/08/23   Moishe [name] HERO, NP  Vitamin D , Ergocalciferol , (DRISDOL ) 1.25 MG (50000 UNIT) CAPS capsule Take 50,000 Units by mouth once a week. 10/02/23   [provider]  doxycycline  (VIBRA -TABS) 100 MG tablet Take 1 tablet (100 mg total) by mouth 2 (two) times daily. 01/16/22   Loreda Hacker, DPM    Family History Family History  Problem Relation Age of Onset   Diabetes Mother    Hypertension Mother    Cancer Mother    Kidney disease Mother    Hypertension Father    Cancer Father        Prostate   Breast cancer Maternal Aunt 13   Colon cancer Son    Kidney cancer Neg Hx    Bladder Cancer Neg Hx     Social History Social History   Tobacco Use   Smoking status: Never    Passive exposure: Never   Smokeless tobacco: Never   Tobacco comments:    Never smoke 10/23/22  Vaping Use   Vaping status: Never Used  Substance Use Topics   Alcohol  use: Not Currently    Alcohol /week: 1.0 standard drink of alcohol     Types: 1 Glasses of wine per week    Comment: RARELY   Drug use: No     Allergies   Flexeril  [cyclobenzaprine ], Cheese, Ciprofloxacin hcl, Coconut (cocos nucifera), and Keflex [cephalexin]   Review of Systems Review of Systems   Physical Exam Triage Vital Signs ED Triage Vitals  Encounter Vitals Group     BP 02/24/24 0914 107/75     Girls Systolic BP Percentile --      Girls Diastolic BP Percentile --      Boys Systolic BP Percentile --       Boys Diastolic BP Percentile --      Pulse Rate 02/24/24 0914 79     Resp 02/24/24 0914 18     Temp 02/24/24 0914 98.3 F (36.8 C)     Temp Source 02/24/24 0914 Oral     SpO2 02/24/24 0912 94 %     Weight --      Height --      Head Circumference --      Peak Flow --      Pain Score 02/24/24 0912 0     Pain Loc --      Pain Education --      Exclude from Growth Chart --    No data found.  Updated Vital Signs BP 107/75 (BP Location: Left Arm)   Pulse 79   Temp 98.3 F (36.8 C) (Oral)   Resp 18   SpO2 (!) 84%   Visual Acuity Right Eye Distance:   Left Eye Distance:   Bilateral Distance:    Right Eye Near:   Left Eye Near:    Bilateral Near:     Physical Exam Constitutional:      Appearance: Normal appearance.  HENT:     Right Ear: Tympanic membrane, ear canal and external ear normal.     Left Ear: Tympanic membrane, ear canal and external ear normal.     Nose: Congestion present.     Mouth/Throat:     Mouth: Mucous membranes are moist.     Pharynx: Oropharynx is clear.  Eyes:     Extraocular Movements: Extraocular movements intact.  Cardiovascular:     Rate and Rhythm: Normal rate and regular rhythm.  Pulses: Normal pulses.     Heart sounds: Normal heart sounds.  Pulmonary:     Effort: Pulmonary effort is normal.     Breath sounds: Normal breath sounds.     Comments: Congested cough witnessed Neurological:     Mental Status: She is alert and oriented to person, place, and time. Mental status is at baseline.      UC Treatments / Results  Labs (all labs ordered are listed, but only abnormal results are displayed) Labs Reviewed - No data to display  EKG   Radiology No results found.  Procedures Procedures (including critical care time)  Medications Ordered in UC Medications - No data to display  Initial Impression / Assessment and Plan / UC Course  I have reviewed the triage vital signs and the nursing notes.  Pertinent labs & imaging  results that were available during my care of the patient were reviewed by me and considered in my medical decision making (see chart for details).  Acute URI   Vital signs are stable, O2 saturation 90% on room air and patient is in no signs of distress nontoxic-appearing, lungs clear to auscultation however cough is congested, deferring imaging at this time, patient in agreement with plan of care, symptoms persisting for 2 days without signs of resolution empirically placed on Augmentin , additionally prescribed Tessalon  and Bromfed cough syrup for management of symptoms, recommended nonpharmacological and over-the-counter measures advised follow-up if symptoms persist or worsen Final Clinical Impressions(s) / UC Diagnoses   Final diagnoses:  Acute URI     Discharge Instructions      Today you were evaluated for your continued cough and congestion, on exam your lungs are clear and you are getting enough air without assistance therefore we have held off on x-ray  As your symptoms have not started to go away on their own you will be started on antibiotic  Take Augmentin  twice daily for 7 days  You may use Tessalon  pill every 8 hours as needed and may use cough syrup every 6 hours as needed for coughing  You can take Tylenol  and/or Ibuprofen as needed for fever reduction and pain relief.   For cough: honey 1/2 to 1 teaspoon (you can dilute the honey in water or another fluid).  You can also use guaifenesin  and dextromethorphan for cough. You can use a humidifier for chest congestion and cough.  If you don't have a humidifier, you can sit in the bathroom with the hot shower running.      For sore throat: try warm salt water gargles, cepacol lozenges, throat spray, warm tea or water with lemon/honey, popsicles or ice, or OTC cold relief medicine for throat discomfort.   For congestion: take a daily anti-histamine like Zyrtec, Claritin , and a oral decongestant, such as pseudoephedrine.  You  can also use Flonase  1-2 sprays in each nostril daily.   It is important to stay hydrated: drink plenty of fluids (water, gatorade/powerade/pedialyte, juices, or teas) to keep your throat moisturized and help further relieve irritation/discomfort.    ED Prescriptions     Medication Sig Dispense Auth. Provider   amoxicillin -clavulanate (AUGMENTIN ) 875-125 MG tablet Take 1 tablet by mouth every 12 (twelve) hours. 14 tablet Kayonna Lawniczak R, NP   benzonatate  (TESSALON ) 100 MG capsule Take 1 capsule (100 mg total) by mouth every 8 (eight) hours. 21 capsule Hugh Kamara R, NP   brompheniramine-pseudoephedrine-DM 30-2-10 MG/5ML syrup  (Status: Discontinued) Take 2.5 mLs by mouth 4 (four) times daily as needed.  120 mL Janene Yousuf R, NP   brompheniramine-pseudoephedrine-DM 30-2-10 MG/5ML syrup Take 5 mLs by mouth 4 (four) times daily as needed. 120 mL Teresa Shelba SAUNDERS, NP      PDMP not reviewed this encounter.   Teresa Shelba SAUNDERS, [place] 02/24/24 [phone]

## 2024-02-24 NOTE — Discharge Instructions (Addendum)
 Today you were evaluated for your continued cough and congestion, on exam your lungs are clear and you are getting enough air without assistance therefore we have held off on x-ray  As your symptoms have not started to go away on their own you will be started on antibiotic  Take Augmentin  twice daily for 7 days  You may use Tessalon  pill every 8 hours as needed and may use cough syrup every 6 hours as needed for coughing  You can take Tylenol  and/or Ibuprofen as needed for fever reduction and pain relief.   For cough: honey 1/2 to 1 teaspoon (you can dilute the honey in water or another fluid).  You can also use guaifenesin  and dextromethorphan for cough. You can use a humidifier for chest congestion and cough.  If you don't have a humidifier, you can sit in the bathroom with the hot shower running.      For sore throat: try warm salt water gargles, cepacol lozenges, throat spray, warm tea or water with lemon/honey, popsicles or ice, or OTC cold relief medicine for throat discomfort.   For congestion: take a daily anti-histamine like Zyrtec, Claritin , and a oral decongestant, such as pseudoephedrine.  You can also use Flonase  1-2 sprays in each nostril daily.   It is important to stay hydrated: drink plenty of fluids (water, gatorade/powerade/pedialyte, juices, or teas) to keep your throat moisturized and help further relieve irritation/discomfort.

## 2024-02-24 NOTE — ED Triage Notes (Signed)
 Patient tested positive for Covid on the 02/15/24. Patient continues ti have cough and nasal congestion. Patient states she was unable to to tae antiviral the way it was prescribe. Denies pain.

## 2024-02-26 DIAGNOSIS — L570 Actinic keratosis: Secondary | ICD-10-CM | POA: Diagnosis not present

## 2024-02-26 DIAGNOSIS — D485 Neoplasm of uncertain behavior of skin: Secondary | ICD-10-CM | POA: Diagnosis not present

## 2024-02-26 DIAGNOSIS — I5032 Chronic diastolic (congestive) heart failure: Secondary | ICD-10-CM | POA: Diagnosis not present

## 2024-02-26 DIAGNOSIS — L820 Inflamed seborrheic keratosis: Secondary | ICD-10-CM | POA: Diagnosis not present

## 2024-02-26 DIAGNOSIS — D2271 Melanocytic nevi of right lower limb, including hip: Secondary | ICD-10-CM | POA: Diagnosis not present

## 2024-02-26 DIAGNOSIS — D2272 Melanocytic nevi of left lower limb, including hip: Secondary | ICD-10-CM | POA: Diagnosis not present

## 2024-02-26 DIAGNOSIS — L905 Scar conditions and fibrosis of skin: Secondary | ICD-10-CM | POA: Diagnosis not present

## 2024-02-26 DIAGNOSIS — I480 Paroxysmal atrial fibrillation: Secondary | ICD-10-CM | POA: Diagnosis not present

## 2024-02-26 DIAGNOSIS — Z85820 Personal history of malignant melanoma of skin: Secondary | ICD-10-CM | POA: Diagnosis not present

## 2024-02-26 DIAGNOSIS — D2261 Melanocytic nevi of right upper limb, including shoulder: Secondary | ICD-10-CM | POA: Diagnosis not present

## 2024-02-26 DIAGNOSIS — I10 Essential (primary) hypertension: Secondary | ICD-10-CM | POA: Diagnosis not present

## 2024-02-26 DIAGNOSIS — C44311 Basal cell carcinoma of skin of nose: Secondary | ICD-10-CM | POA: Diagnosis not present

## 2024-02-26 DIAGNOSIS — Z85828 Personal history of other malignant neoplasm of skin: Secondary | ICD-10-CM | POA: Diagnosis not present

## 2024-02-26 DIAGNOSIS — L538 Other specified erythematous conditions: Secondary | ICD-10-CM | POA: Diagnosis not present

## 2024-02-26 DIAGNOSIS — D2262 Melanocytic nevi of left upper limb, including shoulder: Secondary | ICD-10-CM | POA: Diagnosis not present

## 2024-02-27 ENCOUNTER — Telehealth: Payer: Self-pay

## 2024-02-27 DIAGNOSIS — I480 Paroxysmal atrial fibrillation: Secondary | ICD-10-CM

## 2024-02-27 LAB — BASIC METABOLIC PANEL WITH GFR
BUN/Creatinine Ratio: 13 (ref 12–28)
BUN: 17 mg/dL (ref 8–27)
CO2: 26 mmol/L (ref 20–29)
Calcium: 9.3 mg/dL (ref 8.7–10.3)
Chloride: 93 mmol/L — ABNORMAL LOW (ref 96–106)
Creatinine, Ser: 1.35 mg/dL — ABNORMAL HIGH (ref 0.57–1.00)
Glucose: 202 mg/dL — ABNORMAL HIGH (ref 70–99)
Potassium: 3.8 mmol/L (ref 3.5–5.2)
Sodium: 140 mmol/L (ref 134–144)
eGFR: 39 mL/min/1.73 — ABNORMAL LOW (ref >59)

## 2024-02-27 LAB — MAGNESIUM: Magnesium: 2.4 mg/dL — ABNORMAL HIGH (ref 1.6–2.3)

## 2024-02-27 NOTE — Telephone Encounter (Signed)
 Left message for patient to call back.  Patient with positive COVID test on 8/16.  Recent Urgent care visit on 8/25 with continued symptoms and started on 7 days of antibiotics. She is schedule for an ablation on 9/12 that will likely need to be rescheduled.

## 2024-02-27 NOTE — Telephone Encounter (Signed)
 Patient is aware need to postpone ablation.  She aware she will getting in a call for scheduler to reschedule.

## 2024-02-27 NOTE — Telephone Encounter (Signed)
 Patient returned call

## 2024-02-27 NOTE — Telephone Encounter (Signed)
 Left message for patient to call back

## 2024-03-02 ENCOUNTER — Ambulatory Visit
Admission: RE | Admit: 2024-03-02 | Discharge: 2024-03-02 | Disposition: A | Discharge status: Home / Self Care | Source: Ambulatory Visit | Attending: Emergency Medicine | Admitting: Emergency Medicine

## 2024-03-02 ENCOUNTER — Other Ambulatory Visit: Payer: Self-pay | Admitting: Family Medicine

## 2024-03-02 ENCOUNTER — Ambulatory Visit
Admission: RE | Admit: 2024-03-02 | Discharge: 2024-03-02 | Disposition: A | Discharge status: Home / Self Care | Payer: Self-pay | Attending: Emergency Medicine | Admitting: Emergency Medicine

## 2024-03-02 ENCOUNTER — Ambulatory Visit
Admission: RE | Admit: 2024-03-02 | Discharge: 2024-03-02 | Disposition: A | Discharge status: Home / Self Care | Source: Ambulatory Visit | Attending: Family Medicine | Admitting: Family Medicine

## 2024-03-02 VITALS — BP 116/73 | HR 54 | Temp 97.7°F | Resp 19

## 2024-03-02 DIAGNOSIS — I701 Atherosclerosis of renal artery: Secondary | ICD-10-CM

## 2024-03-02 DIAGNOSIS — Z8616 Personal history of COVID-19: Secondary | ICD-10-CM | POA: Diagnosis not present

## 2024-03-02 DIAGNOSIS — E1169 Type 2 diabetes mellitus with other specified complication: Secondary | ICD-10-CM

## 2024-03-02 DIAGNOSIS — R051 Acute cough: Secondary | ICD-10-CM | POA: Diagnosis not present

## 2024-03-02 DIAGNOSIS — R0989 Other specified symptoms and signs involving the circulatory and respiratory systems: Secondary | ICD-10-CM | POA: Diagnosis not present

## 2024-03-02 DIAGNOSIS — R059 Cough, unspecified: Secondary | ICD-10-CM | POA: Diagnosis not present

## 2024-03-02 DIAGNOSIS — U071 COVID-19: Secondary | ICD-10-CM

## 2024-03-02 DIAGNOSIS — I700 Atherosclerosis of aorta: Secondary | ICD-10-CM | POA: Diagnosis not present

## 2024-03-02 NOTE — ED Provider Notes (Signed)
 [name]    CSN: [id number] Arrival date & time: 03/02/24  1401      History   Chief Complaint Chief Complaint  Patient presents with   Cough    Congestion after covid still present. - Entered by patient    HPI Bonnie Rowland is a 83 y.o. female.  Patient presents with 2-week history of chest congestion and cough.  No fever, chest pain, palpitations, shortness of breath.  She was diagnosed with COVID 2 weeks ago and was also recently treated with an antibiotic.    Patient was seen at this urgent care on 02/15/2024 and was COVID positive; treated with molnupiravir .  She was seen at this urgent care again on 02/24/2024; diagnosed with acute URI; treated with Augmentin , Tessalon  Perles, Bromfed DM.  Patient has history of atrial fibrillation and is scheduled for an ablation on 03/13/2024 per her chart.  Her medical history also includes heart failure and cardiomyopathy.  The history is provided by the patient and medical records.    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; left shoulder blade   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   Cataract 2003   Cellulitis and abscess of leg, except foot    Conjunctivitis unspecified    Dermatophytosis of nail    Diabetes mellitus    type II   Elevated troponin    Esophageal reflux    Hyperlipidemia    Hypertension    Osteoporosis 2016   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)    Renal artery stenosis (HCC)    Sprain of lumbar region    Thoracic or lumbosacral neuritis or  radiculitis, unspecified    Urinary tract infection, site not specified     Patient Active Problem List   Diagnosis Date Noted   Paroxysmal atrial fibrillation with RVR (HCC) 11/27/2023   Atrial fibrillation with RVR (HCC) 11/26/2023   Nonobstructive coronary artery disease 11/26/2023   (HFpEF) heart failure with preserved ejection fraction (HCC) 11/26/2023   Hypokalemia 11/26/2023   Hypotension 11/26/2023   Non-ischemic cardiomyopathy (HCC) 09/13/2023   Muscle spasm of back 02/14/2023   Hypercoagulable state due to persistent atrial fibrillation (HCC) 10/23/2022   Rapid atrial fibrillation (HCC) 04/12/2022   Obesity (BMI 30-39.9)    Contusion of foot 03/08/2022   Pain due to onychomycosis of toenails of both feet 09/07/2021   Persistent atrial fibrillation (HCC)    Chronic combined systolic and diastolic congestive heart failure (HCC)    AF (paroxysmal atrial fibrillation) (HCC) 01/23/2021   DM (diabetes mellitus) (HCC) 01/23/2021   Plantar fasciitis, bilateral 05/21/2019   Age-related osteoporosis without current pathological fracture 11/05/2017   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(HCC) 07/09/2017   Atherosclerosis of abdominal aorta (HCC) 07/09/2017   Bilateral carotid bruits 08/24/2015   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   Chronic bilateral low back pain without sciatica 01/10/2015   History of melanoma excision 01/10/2015   Spinal stenosis, lumbar region, 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   DDD (degenerative disc disease), lumbar  11/14/2014   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   Hyperlipemia 11/07/2009   Atherosclerosis of renal artery (HCC) 11/07/2009   Perennial allergic rhinitis 11/27/2007   Lumbosacral neuritis 01/17/2007    Past Surgical History:  Procedure Laterality Date   APPENDECTOMY     ATRIAL FIBRILLATION ABLATION N/A 09/25/2022   Procedure: ATRIAL FIBRILLATION ABLATION;  Surgeon: Cindie Ole DASEN, MD;  Location: [facility];  Service: Cardiovascular;   Laterality: N/A;   BIOPSY  02/28/2023   Procedure: BIOPSY;  Surgeon: Onita Elspeth Sharper, DO;  Location: [facility];  Service: Gastroenterology;;   CARDIAC CATHETERIZATION     CARDIOVERSION N/A 11/30/2020   Procedure: CARDIOVERSION;  Surgeon: Perla Evalene PARAS, MD;  Location: [facility];  Service: Cardiovascular;  Laterality: N/A;   CARDIOVERSION N/A 01/20/2021   Procedure: CARDIOVERSION;  Surgeon: Perla Evalene PARAS, MD;  Location: [facility];  Service: Cardiovascular;  Laterality: N/A;   CARDIOVERSION N/A 01/30/2021   Procedure: CARDIOVERSION;  Surgeon: Darron Deatrice LABOR, MD;  Location: [facility];  Service: Cardiovascular;  Laterality: N/A;   cataract surgery     COLONOSCOPY     COLONOSCOPY N/A 02/28/2023   Procedure: COLONOSCOPY;  Surgeon: Onita Elspeth Sharper, DO;  Location: [facility] ENDOSCOPY;  Service: Gastroenterology;  Laterality: N/A;   COLONOSCOPY WITH PROPOFOL  N/A 05/09/2016   Procedure: COLONOSCOPY WITH PROPOFOL ;  Surgeon: Reyes LELON Cota, MD;  Location: [facility];  Service: Endoscopy;  Laterality: N/A;   DIAGNOSTIC MAMMOGRAM     EYE SURGERY Bilateral    Cataract Extraction   JOINT REPLACEMENT  2016   KNEE ARTHROPLASTY Right 03/30/2015   Procedure: COMPUTER ASSISTED TOTAL KNEE ARTHROPLASTY;  Surgeon: Lynwood SHAUNNA Hue, MD;  Location: [facility] OR[facility];  Service: Orthopedics;  Laterality: Right;   KNEE ARTHROSCOPY Right    KNEE CLOSED REDUCTION Right 05/23/2015   Procedure: CLOSED MANIPULATION KNEE;  Surgeon: Lynwood SHAUNNA Hue, MD;  Location: [facility];  Service: Orthopedics;  Laterality: Right;   POLYPECTOMY  02/28/2023   Procedure: POLYPECTOMY;  Surgeon: Onita Elspeth Sharper, DO;  Location: [facility]COPY;  Service: Gastroenterology;;   RIGHT/LEFT HEART CATH AND CORONARY ANGIOGRAPHY N/A 01/25/2021   Procedure: RIGHT/LEFT HEART CATH AND CORONARY ANGIOGRAPHY;  Surgeon: Mady Bruckner, MD;  Location: [facility];  Service: Cardiovascular;  Laterality: N/A;   TONSILLECTOMY      OB  History   No obstetric history on file.      Home Medications    Prior to Admission medications   Medication Sig Start Date End Date Taking? Authorizing Provider  acetaminophen  (TYLENOL ) 500 MG tablet Take 1,000 mg by mouth at bedtime.    [provider]  ALFALFA PO Take 1 tablet by mouth daily.    [provider]  amiodarone  (PACERONE ) 200 MG tablet Take 1 tablet (200 mg total) by mouth daily. 01/13/24   Cindie Ole DASEN, MD  amoxicillin -clavulanate (AUGMENTIN ) 875-125 MG tablet Take 1 tablet by mouth every 12 (twelve) hours. Patient not taking: Reported on 03/02/2024 02/24/24   Teresa Shelba SAUNDERS, NP  ascorbic Acid  (VITAMIN C ) 500 MG CPCR Take 500 mg by mouth daily.    [provider]  B Complex-C (B-COMPLEX WITH VITAMIN C ) tablet Take 1 tablet by mouth daily. **SKAKLEE OSTEOMATRIX**    [provider]  BD PEN NEEDLE NANO U/F 32G X 4 MM MISC USE 4 TIMES DAILY (HUMALOG  3 TIMES DAILY AND LANTUS  ONCE DAILY) OFFICE NOTIFIED 08/06/17 12/07/18   Sowles, Krichna, MD  benzonatate  (TESSALON ) 100 MG capsule Take 1 capsule (  100 mg total) by mouth every 8 (eight) hours. Patient not taking: Reported on 03/02/2024 02/24/24   Teresa Shelba SAUNDERS, NP  brompheniramine-pseudoephedrine-DM 30-2-10 MG/5ML syrup Take 5 mLs by mouth 4 (four) times daily as needed. Patient not taking: Reported on 03/02/2024 02/24/24   Teresa Shelba SAUNDERS, NP  Calcium  Carbonate (CALCIUM  600 PO) Take 600 mg by mouth daily.    [provider]  carvedilol  (COREG ) 6.25 MG tablet TAKE 1 TABLET BY MOUTH 2 TIMES DAILY WITH A MEAL. 07/19/23   Loistine Sober, NP  cholecalciferol  (VITAMIN D ) 1000 UNITS tablet Take 1,000 Units by mouth daily. shaklee    [provider]  clobetasol  ointment (TEMOVATE ) 0.05 % Apply 1 Application topically as needed (FOR BUG BITES).    [provider]  Coenzyme Q10 (COQ10) 200 MG CAPS Take 200 mg by mouth daily.    [provider]  Continuous Glucose  Sensor (DEXCOM G7 SENSOR) MISC 1 each by Does not apply route as directed.    [provider]  Elastic Bandages & Supports (MEDICAL COMPRESSION STOCKINGS) MISC 1 each by Does not apply route daily. 01/08/19   Sowles, Krichna, MD  ELIQUIS  5 MG TABS tablet TAKE 1 TABLET BY MOUTH TWICE A DAY 09/11/23   Cindie Ole DASEN, MD  empagliflozin  (JARDIANCE ) 25 MG TABS tablet Take 25 mg by mouth daily. 08/26/22   Cherilyn Debby CROME, MD  ENTRESTO  49-51 MG TAKE 1 TABLET BY MOUTH TWICE A DAY 12/05/23   Donette City A, FNP  estradiol  (ESTRACE ) 0.1 MG/GM vaginal cream APPLY 1/2 GM ONCE WEEKLY USING APPLICATOR, APPLY BLUEBERRY SIZED AMOUNT OF CREAM USING TIP OF FINGER TO URETHRA TWICE WEEKLY 01/18/23   McGowan, Clotilda A, [name]  HUMALOG  KWIKPEN 100 UNIT/ML KiwkPen Inject 2-6 Units into the skin 3 (three) times daily as needed (High blood sugar). 10/15/17   [provider]  insulin  glargine (LANTUS ) 100 unit/mL SOPN Inject 13 Units into the skin in the morning.    [provider]  Anselm Oil 1000 MG CAPS Take 1,000 mg by mouth daily.    [provider]  Mag Aspart-Potassium Aspart (POTASSIUM & MAGNESIUM  ASPARTAT PO) Take 1 tablet by mouth daily.    [provider]  Multiple Vitamins-Minerals (PRESERVISION AREDS 2) CHEW Chew 1 each by mouth 2 (two) times daily.    [provider]  nystatin  cream (MYCOSTATIN ) APPLY TO AFFECTED AREA TWICE A DAY 12/25/23   McGowan, Clotilda A, [name]  OVER THE COUNTER MEDICATION Place 1 application  into both eyes See admin instructions. Blephadex eyelid foam, apply to eyelids once daily    [provider]  OVER THE COUNTER MEDICATION Take 1 tablet by mouth daily. Vita-Lea Gold otc supplement With out vitamin K (Shaklee products)    [provider]  OVER THE COUNTER MEDICATION Take 1 tablet by mouth daily. Nutriferon otc supplement    [provider]  Probiotic Product (PROBIOTIC PO) Take 1 capsule by mouth at bedtime.     [provider]  PROLIA  60 MG/ML SOSY injection Inject 60 mg into the skin every 6 (six) months. 09/02/19   [provider]  Propylene Glycol (SYSTANE BALANCE OP) Place 1 drop into both eyes daily as needed (dry eyes).    [provider]  rosuvastatin  (CRESTOR ) 10 MG tablet TAKE 1 TABLET BY MOUTH EVERY DAY 12/05/23   Sowles, Krichna, MD  Sodium Chloride -Sodium Bicarb (NETI POT SINUS WASH NA) Place 1 Dose into the nose at bedtime.  [provider]  tirzepatide CLOYDE) 10 MG/0.5ML Pen Inject 10 mg into the skin once a week. 08/23/22   Cherilyn Debby CROME, MD  tiZANidine  (ZANAFLEX ) 2 MG tablet TAKE 1-2 TABLETS (2-4 MG TOTAL) BY MOUTH AT BEDTIME. 11/04/23   Sowles, Krichna, MD  torsemide  (DEMADEX ) 20 MG tablet Take 3 tablets (60 mg total) by mouth daily. 10/29/23   Donette Ellouise LABOR, FNP  trimethoprim -polymyxin b  (POLYTRIM ) ophthalmic solution Place 1 drop into both eyes in the morning, at noon, in the evening, and at bedtime. 08/08/23   Moishe [name] HERO, NP  Vitamin D , Ergocalciferol , (DRISDOL ) 1.25 MG (50000 UNIT) CAPS capsule Take 50,000 Units by mouth once a week. 10/02/23   [provider]  doxycycline  (VIBRA -TABS) 100 MG tablet Take 1 tablet (100 mg total) by mouth 2 (two) times daily. 01/16/22   Loreda Hacker, DPM    Family History Family History  Problem Relation Age of Onset   Diabetes Mother    Hypertension Mother    Cancer Mother    Kidney disease Mother    Hypertension Father    Cancer Father        Prostate   Breast cancer Maternal Aunt 65   Colon cancer Son    Kidney cancer Neg Hx    Bladder Cancer Neg Hx     Social History Social History   Tobacco Use   Smoking status: Never    Passive exposure: Never   Smokeless tobacco: Never   Tobacco comments:    Never smoke 10/23/22  Vaping Use   Vaping status: Never Used  Substance Use Topics   Alcohol  use: Not Currently    Alcohol /week: 1.0 standard drink of alcohol     Types: 1 Glasses  of wine per week    Comment: RARELY   Drug use: No     Allergies   Flexeril  [cyclobenzaprine ], Cheese, Ciprofloxacin hcl, Coconut (cocos nucifera), and Keflex [cephalexin]   Review of Systems Review of Systems  Constitutional:  Negative for chills and fever.  HENT:  Negative for ear pain and sore throat.   Respiratory:  Positive for cough. Negative for shortness of breath.   Cardiovascular:  Negative for chest pain and palpitations.     Physical Exam Triage Vital Signs ED Triage Vitals  Encounter Vitals Group     BP      Girls Systolic BP Percentile      Girls Diastolic BP Percentile      Boys Systolic BP Percentile      Boys Diastolic BP Percentile      Pulse      Resp      Temp      Temp src      SpO2      Weight      Height      Head Circumference      Peak Flow      Pain Score      Pain Loc      Pain Education      Exclude from Growth Chart    No data found.  Updated Vital Signs BP 116/73   Pulse (!) 54   Temp 97.7 F (36.5 C)   Resp 19   SpO2 94%   Visual Acuity Right Eye Distance:   Left Eye Distance:   Bilateral Distance:    Right Eye Near:   Left Eye Near:    Bilateral Near:     Physical Exam Constitutional:      General: She  is not in acute distress. HENT:     Right Ear: Tympanic membrane normal.     Left Ear: Tympanic membrane normal.     Nose: Nose normal.     Mouth/Throat:     Mouth: Mucous membranes are moist.     Pharynx: Oropharynx is clear.  Cardiovascular:     Rate and Rhythm: Normal rate and regular rhythm.     Heart sounds: Normal heart sounds.  Pulmonary:     Effort: Pulmonary effort is normal. No respiratory distress.     Breath sounds: Normal breath sounds.  Neurological:     Mental Status: She is alert.      UC Treatments / Results  Labs (all labs ordered are listed, but only abnormal results are displayed) Labs Reviewed - No data to display  EKG   Radiology DG Chest 2 View Result Date:  03/02/2024 CLINICAL DATA:  Cough and congestion.  COVID 2 weeks ago. EXAM: CHEST - 2 VIEW COMPARISON:  04/12/2022, 02/21/2024. FINDINGS: The heart size and mediastinal contours are within normal limits. There is atherosclerotic calcification of the aorta. No consolidation, effusion, or pneumothorax is seen. Degenerative changes are present in the thoracic spine. No acute osseous abnormality. IMPRESSION: No active cardiopulmonary disease. Electronically Signed   By: Leita Birmingham M.D.   On: 03/02/2024 15:33    Procedures Procedures (including critical care time)  Medications Ordered in UC Medications - No data to display  Initial Impression / Assessment and Plan / UC Course  I have reviewed the triage vital signs and the nursing notes.  Pertinent labs & imaging results that were available during my care of the patient were reviewed by me and considered in my medical decision making (see chart for details).   Cough, chest congestion, recent COVID-19.  Afebrile and vital signs are stable.  O2 sat 94% on room air without mask.  Patient was diagnosed with COVID 2 weeks ago and treated with molnupiravir .  She then was treated with Augmentin  approximately 1 week ago.  She has ongoing cough.  No chest pain, heart palpitations, or shortness of breath.  CXR negative today.  Discussed x-ray findings with patient via telephone.  Instructed patient to take Tessalon  Perles as directed for cough (prescribed at previous visit).  Instructed her to follow-up with her PCP tomorrow.  ED precautions given.  She agrees to plan of care.    Final Clinical Impressions(s) / UC Diagnoses   Final diagnoses:  Acute cough  Chest congestion  COVID-19     Discharge Instructions      Go to [facility] medical mall to have your chest x-ray.  I will call you with the results.     Follow up with your primary care provider tomorrow.  Go to the emergency department if you have worsening symptoms.        ED  Prescriptions   None    PDMP not reviewed this encounter.   Corlis Burnard DEL, NP 03/02/24 1622

## 2024-03-02 NOTE — ED Triage Notes (Signed)
 Patient to Urgent Care with complaints of cough/ nasal and chest congestion/ fatigue. Denies any recent fevers.   Symptoms x2 weeks. Covid positive 8/16.   Completed antibiotic (augmentin ) yesterday. No otc meds.

## 2024-03-02 NOTE — Discharge Instructions (Addendum)
 Go to [facility] medical mall to have your chest x-ray.  I will call you with the results.     Follow up with your primary care provider tomorrow.  Go to the emergency department if you have worsening symptoms.

## 2024-03-03 ENCOUNTER — Ambulatory Visit (HOSPITAL_COMMUNITY): Payer: Self-pay

## 2024-03-04 NOTE — Addendum Note (Signed)
 Addended by: CHAUVIGNE, Rahaf Carbonell on: 03/04/2024 10:01 AM   Modules accepted: Orders

## 2024-03-04 NOTE — Telephone Encounter (Signed)
 Spoke with the patient and advised that we will need reschedule her ablation. Procedure has been moved to 10/7 at 1230pm.

## 2024-03-07 ENCOUNTER — Other Ambulatory Visit: Payer: Self-pay | Admitting: Cardiology

## 2024-03-07 DIAGNOSIS — I480 Paroxysmal atrial fibrillation: Secondary | ICD-10-CM

## 2024-03-09 DIAGNOSIS — M9901 Segmental and somatic dysfunction of cervical region: Secondary | ICD-10-CM | POA: Diagnosis not present

## 2024-03-09 DIAGNOSIS — M9903 Segmental and somatic dysfunction of lumbar region: Secondary | ICD-10-CM | POA: Diagnosis not present

## 2024-03-09 DIAGNOSIS — M5033 Other cervical disc degeneration, cervicothoracic region: Secondary | ICD-10-CM | POA: Diagnosis not present

## 2024-03-09 DIAGNOSIS — M5416 Radiculopathy, lumbar region: Secondary | ICD-10-CM | POA: Diagnosis not present

## 2024-03-09 NOTE — Telephone Encounter (Signed)
 Prescription refill request for Eliquis  received. Indication afib Last office visit:8/25 Scr:1.35  8/25 Age: 83 Weight:65.3  kg  Prescription refilled

## 2024-03-16 ENCOUNTER — Encounter: Payer: Self-pay | Admitting: Family Medicine

## 2024-03-16 ENCOUNTER — Ambulatory Visit: Admitting: Family Medicine

## 2024-03-16 VITALS — BP 116/62 | HR 50 | Resp 18 | Ht 63.0 in | Wt 139.4 lb

## 2024-03-16 DIAGNOSIS — I5042 Chronic combined systolic (congestive) and diastolic (congestive) heart failure: Secondary | ICD-10-CM

## 2024-03-16 DIAGNOSIS — I480 Paroxysmal atrial fibrillation: Secondary | ICD-10-CM

## 2024-03-16 DIAGNOSIS — E785 Hyperlipidemia, unspecified: Secondary | ICD-10-CM

## 2024-03-16 DIAGNOSIS — G8929 Other chronic pain: Secondary | ICD-10-CM

## 2024-03-16 DIAGNOSIS — I701 Atherosclerosis of renal artery: Secondary | ICD-10-CM

## 2024-03-16 DIAGNOSIS — B3731 Acute candidiasis of vulva and vagina: Secondary | ICD-10-CM

## 2024-03-16 DIAGNOSIS — I700 Atherosclerosis of aorta: Secondary | ICD-10-CM

## 2024-03-16 DIAGNOSIS — I428 Other cardiomyopathies: Secondary | ICD-10-CM | POA: Diagnosis not present

## 2024-03-16 DIAGNOSIS — R251 Tremor, unspecified: Secondary | ICD-10-CM | POA: Diagnosis not present

## 2024-03-16 DIAGNOSIS — M545 Low back pain, unspecified: Secondary | ICD-10-CM

## 2024-03-16 DIAGNOSIS — M810 Age-related osteoporosis without current pathological fracture: Secondary | ICD-10-CM

## 2024-03-16 DIAGNOSIS — D696 Thrombocytopenia, unspecified: Secondary | ICD-10-CM

## 2024-03-16 DIAGNOSIS — E1169 Type 2 diabetes mellitus with other specified complication: Secondary | ICD-10-CM | POA: Diagnosis not present

## 2024-03-16 MED ORDER — COVID-19 MRNA VAC-TRIS(PFIZER) 30 MCG/0.3ML IM SUSY: 0.3000 mL | PREFILLED_SYRINGE | Freq: Once | INTRAMUSCULAR | 0 refills | Status: AC

## 2024-03-16 MED ORDER — FLUCONAZOLE 150 MG PO TABS: 150.0000 mg | ORAL_TABLET | ORAL | 1 refills | Status: AC

## 2024-03-16 NOTE — Progress Notes (Addendum)
 Name: Bonnie Rowland   MRN: [id number]    DOB: 1941-04-14   Date:03/27/2024       Progress Note  Subjective  Chief Complaint   History of Present Illness  Osteoporosis postmenopausal : she was on Evista  for years but bone density got worse she was on Reclast  but bone density did not improve,she is under the care of Endo and tolerating Prolia  started March 2025 and is tolerating it well    DM type 2 with associated Dyslipidemia, HTN. She is under the care of   Endocrinologist - Dr. Pleas  up to date with eye exam. She had A1C done recently and it was 6.5 % .   She is taking Mounjaro 10 mg weekly, Jardiance , pre-meal insulin  - only prn  and basal insulin .  Denies polyphagia, polydipsia or polyuria.  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 Gollan, she had an ablation done March 26 th 2024, however she will need another ablation soon. . She states lower extremity edema has been well controlled. She denies palpitations or decrease in exercises tolerance. She is still taking Entresto , Eliquis , Carvedilol  , Jardiance  , Rosuvastatin  also furosemide    Back pain: she has been taking Tizanidine  every night 2-4 mg prn at night for spasms, pain is described as intermittent, aching like and states exercising daily to keep back pain under control .  Unchanged    Patient Active Problem List   Diagnosis Date Noted   Paroxysmal atrial fibrillation with RVR (HCC) 11/27/2023   Atrial fibrillation with RVR (HCC) 11/26/2023   Nonobstructive coronary artery disease 11/26/2023   (HFpEF) heart failure with preserved ejection fraction (HCC) 11/26/2023   Hypokalemia 11/26/2023   Hypotension 11/26/2023    Non-ischemic cardiomyopathy (HCC) 09/13/2023   Muscle spasm of back 02/14/2023   Hypercoagulable state due to persistent atrial fibrillation (HCC) 10/23/2022   Rapid atrial fibrillation (HCC) 04/12/2022   Obesity (BMI 30-39.9)    Contusion of foot 03/08/2022   Pain due to onychomycosis of toenails of both feet 09/07/2021   Persistent atrial fibrillation (HCC)    Chronic combined systolic and diastolic congestive heart failure (HCC)    AF (paroxysmal atrial fibrillation) (HCC) 01/23/2021   DM (diabetes mellitus) (HCC) 01/23/2021   Plantar fasciitis, bilateral 05/21/2019   Age-related osteoporosis without current pathological fracture 11/05/2017   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07/09/2017   Atherosclerosis of abdominal aorta 07/09/2017   Bilateral carotid bruits 08/24/2015   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   Chronic bilateral low back pain without sciatica 01/10/2015   History of melanoma excision 01/10/2015   Spinal stenosis, lumbar region, 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   DDD (degenerative disc disease), lumbar 11/14/2014   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   Hyperlipemia 11/07/2009   Atherosclerosis of renal artery (HCC) 11/07/2009   Perennial allergic rhinitis 11/27/2007   Lumbosacral neuritis 01/17/2007    Past Surgical History:  Procedure Laterality Date   APPENDECTOMY     ATRIAL FIBRILLATION ABLATION N/A 09/25/2022   Procedure: ATRIAL FIBRILLATION ABLATION;  Surgeon: Cindie Ole DASEN, MD;  Location: [facility];  Service: Cardiovascular;  Laterality: N/A;   BIOPSY  02/28/2023   Procedure: BIOPSY;  Surgeon: Onita Standing  Ozell, DO;  Location: [facility];  Service: Gastroenterology;;   CARDIAC CATHETERIZATION     CARDIOVERSION N/A 11/30/2020   Procedure: CARDIOVERSION;  Surgeon: Perla Evalene PARAS, MD;  Location: [facility];  Service: Cardiovascular;  Laterality: N/A;   CARDIOVERSION N/A 01/20/2021   Procedure: CARDIOVERSION;  Surgeon: Perla Evalene PARAS, MD;   Location: [facility];  Service: Cardiovascular;  Laterality: N/A;   CARDIOVERSION N/A 01/30/2021   Procedure: CARDIOVERSION;  Surgeon: Darron Deatrice LABOR, MD;  Location: [facility];  Service: Cardiovascular;  Laterality: N/A;   cataract surgery     COLONOSCOPY     COLONOSCOPY N/A 02/28/2023   Procedure: COLONOSCOPY;  Surgeon: Onita Elspeth Ozell, DO;  Location: [facility] ENDOSCOPY;  Service: Gastroenterology;  Laterality: N/A;   COLONOSCOPY WITH PROPOFOL  N/A 05/09/2016   Procedure: COLONOSCOPY WITH PROPOFOL ;  Surgeon: Reyes LELON Cota, MD;  Location: [facility] ENDOSCOPY;  Service: Endoscopy;  Laterality: N/A;   DIAGNOSTIC MAMMOGRAM     EYE SURGERY Bilateral    Cataract Extraction   JOINT REPLACEMENT  2016   KNEE ARTHROPLASTY Right 03/30/2015   Procedure: COMPUTER ASSISTED TOTAL KNEE ARTHROPLASTY;  Surgeon: Lynwood SHAUNNA Hue, MD;  Location: [facility] OR[facility];  Service: Orthopedics;  Laterality: Right;   KNEE ARTHROSCOPY Right    KNEE CLOSED REDUCTION Right 05/23/2015   Procedure: CLOSED MANIPULATION KNEE;  Surgeon: Lynwood SHAUNNA Hue, MD;  Location: [facility] OR[facility];  Service: Orthopedics;  Laterality: Right;   POLYPECTOMY  02/28/2023   Procedure: POLYPECTOMY;  Surgeon: Onita Elspeth Ozell, DO;  Location: [facility] ENDOSCOPY;  Service: Gastroenterology;;   RIGHT/LEFT HEART CATH AND CORONARY ANGIOGRAPHY N/A 01/25/2021   Procedure: RIGHT/LEFT HEART CATH AND CORONARY ANGIOGRAPHY;  Surgeon: Mady Bruckner, MD;  Location: [facility];  Service: Cardiovascular;  Laterality: N/A;   TONSILLECTOMY      Family History  Problem Relation Age of Onset   Diabetes Mother    Hypertension Mother    Cancer Mother    Kidney disease Mother    Hypertension Father    Cancer Father        Prostate   Breast cancer Maternal Aunt 58   Colon cancer Son    Kidney cancer Neg Hx    Bladder Cancer Neg Hx     Social History   Tobacco Use   Smoking status: Never    Passive exposure: Never   Smokeless tobacco: Never   Tobacco comments:     Never smoke 10/23/22  Substance Use Topics   Alcohol  use: Not Currently    Alcohol /week: 1.0 standard drink of alcohol     Types: 1 Glasses of wine per week    Comment: RARELY     Current Outpatient Medications:    acetaminophen  (TYLENOL ) 500 MG tablet, Take 1,000 mg by mouth at bedtime., Disp: , Rfl:    ALFALFA PO, Take 1 tablet by mouth daily., Disp: , Rfl:    amiodarone  (PACERONE ) 200 MG tablet, Take 1 tablet (200 mg total) by mouth daily., Disp: 90 tablet, Rfl: 3   ascorbic Acid  (VITAMIN C ) 500 MG CPCR, Take 500 mg by mouth daily., Disp: , Rfl:    B Complex-C (B-COMPLEX WITH VITAMIN C ) tablet, Take 1 tablet by mouth daily. **SKAKLEE OSTEOMATRIX**, Disp: , Rfl:    BD PEN NEEDLE NANO U/F 32G X 4 MM MISC, USE 4 TIMES DAILY (HUMALOG  3 TIMES DAILY AND LANTUS  ONCE DAILY) OFFICE NOTIFIED 08/06/17, Disp: 90 each, Rfl: 1   Calcium  Carbonate (CALCIUM  600 PO), Take 600 mg by mouth daily., Disp: ,  Rfl:    carvedilol  (COREG ) 6.25 MG tablet, TAKE 1 TABLET BY MOUTH 2 TIMES DAILY WITH A MEAL., Disp: 180 tablet, Rfl: 3   cholecalciferol  (VITAMIN D ) 1000 UNITS tablet, Take 1,000 Units by mouth daily. shaklee, Disp: , Rfl:    clobetasol  ointment (TEMOVATE ) 0.05 %, Apply 1 Application topically as needed (FOR BUG BITES)., Disp: , Rfl:    Coenzyme Q10 (COQ10) 200 MG CAPS, Take 200 mg by mouth daily., Disp: , Rfl:    Continuous Glucose Sensor (DEXCOM G7 SENSOR) MISC, 1 each by Does not apply route as directed., Disp: , Rfl:    Elastic Bandages & Supports (MEDICAL COMPRESSION STOCKINGS) MISC, 1 each by Does not apply route daily., Disp: 2 each, Rfl: 5   ELIQUIS  5 MG TABS tablet, TAKE 1 TABLET BY MOUTH TWICE A DAY, Disp: 180 tablet, Rfl: 1   empagliflozin  (JARDIANCE ) 25 MG TABS tablet, Take 25 mg by mouth daily., Disp: , Rfl:    ENTRESTO  49-51 MG, TAKE 1 TABLET BY MOUTH TWICE A DAY, Disp: 180 tablet, Rfl: 3   estradiol  (ESTRACE ) 0.1 MG/GM vaginal cream, APPLY 1/2 GM ONCE WEEKLY USING APPLICATOR, APPLY BLUEBERRY  SIZED AMOUNT OF CREAM USING TIP OF FINGER TO URETHRA TWICE WEEKLY, Disp: 42.5 g, Rfl: 3   fluconazole  (DIFLUCAN ) 150 MG tablet, Take 1 tablet (150 mg total) by mouth every other day. Prn, Disp: 9 tablet, Rfl: 1   HUMALOG  KWIKPEN 100 UNIT/ML KiwkPen, Inject 2-6 Units into the skin 3 (three) times daily as needed (High blood sugar)., Disp: , Rfl: 3   insulin  glargine (LANTUS ) 100 unit/mL SOPN, Inject 13 Units into the skin in the morning., Disp: , Rfl:    JUBBONTI 60 MG/ML SOSY, Inject 60 mg into the skin once. Start OCT 15th, Disp: , Rfl:    Krill Oil 1000 MG CAPS, Take 1,000 mg by mouth daily., Disp: , Rfl:    Mag Aspart-Potassium Aspart (POTASSIUM & MAGNESIUM  ASPARTAT PO), Take 1 tablet by mouth daily., Disp: , Rfl:    Multiple Vitamins-Minerals (PRESERVISION AREDS 2) CHEW, Chew 1 each by mouth 2 (two) times daily., Disp: , Rfl:    nystatin  cream (MYCOSTATIN ), APPLY TO AFFECTED AREA TWICE A DAY, Disp: 30 g, Rfl: 0   OVER THE COUNTER MEDICATION, Place 1 application  into both eyes See admin instructions. Blephadex eyelid foam, apply to eyelids once daily, Disp: , Rfl:    OVER THE COUNTER MEDICATION, Take 1 tablet by mouth daily. Vita-Lea Gold otc supplement With out vitamin K (Shaklee products), Disp: , Rfl:    OVER THE COUNTER MEDICATION, Take 1 tablet by mouth daily. Nutriferon otc supplement, Disp: , Rfl:    Probiotic Product (PROBIOTIC PO), Take 1 capsule by mouth at bedtime., Disp: , Rfl:    Propylene Glycol (SYSTANE BALANCE OP), Place 1 drop into both eyes daily as needed (dry eyes)., Disp: , Rfl:    rosuvastatin  (CRESTOR ) 10 MG tablet, TAKE 1 TABLET BY MOUTH EVERY DAY, Disp: 90 tablet, Rfl: 0   Sodium Chloride -Sodium Bicarb (NETI POT SINUS WASH NA), Place 1 Dose into the nose at bedtime., Disp: , Rfl:    tirzepatide (MOUNJARO) 10 MG/0.5ML Pen, Inject 10 mg into the skin once a week., Disp: , Rfl:    tiZANidine  (ZANAFLEX ) 2 MG tablet, TAKE 1-2 TABLETS (2-4 MG TOTAL) BY MOUTH AT BEDTIME., Disp:  180 tablet, Rfl: 1   torsemide  (DEMADEX ) 20 MG tablet, Take 3 tablets (60 mg total) by mouth daily., Disp: 90 tablet, Rfl:  5   trimethoprim -polymyxin b  (POLYTRIM ) ophthalmic solution, Place 1 drop into both eyes in the morning, at noon, in the evening, and at bedtime., Disp: 10 mL, Rfl: 0   Vitamin D , Ergocalciferol , (DRISDOL ) 1.25 MG (50000 UNIT) CAPS capsule, Take 50,000 Units by mouth every 14 (fourteen) days., Disp: , Rfl:    PROLIA  60 MG/ML SOSY injection, Inject 60 mg into the skin every 6 (six) months. (Patient not taking: Reported on 03/16/2024), Disp: , Rfl:   Allergies  Allergen Reactions   Flexeril  [Cyclobenzaprine ] Hypertension   Cheese Other (See Comments)    bloating   Ciprofloxacin Hcl Other (See Comments)    Muscle pain   Coconut (Cocos Nucifera) Other (See Comments)    Upset stomach   Keflex [Cephalexin] Other (See Comments)    Patient prefers not to take this medication due to the side effects, caused tendon issues    I personally reviewed active problem list, medication list, allergies, family history with the patient/caregiver today.   ROS  Ten systems reviewed and is negative except as mentioned in HPI    Objective Physical Exam Constitutional: Patient appears well-developed and well-nourished. Obese  No distress.  HEENT: head atraumatic, normocephalic Cardiovascular: Normal rate, regular rhythm and normal heart sounds.  . No BLE edema. Pulmonary/Chest: Effort normal and breath sounds normal. No respiratory distress. Psychiatric: Patient has a normal mood and affect. behavior is normal. Judgment and thought content normal.   Vitals:   03/16/24 0908  BP: 116/62  Pulse: (!) 50  Resp: 18  SpO2: 93%  Weight: 139 lb 6.4 oz (63.2 kg)  Height: 5' 3 (1.6 m)    Body mass index is 24.69 kg/m.  Recent Results (from the past 2160 hours)  HM DIABETES EYE EXAM     Status: None   Collection Time: 01/07/24  2:12 PM  Result Value Ref Range   HM Diabetic Eye Exam  No Retinopathy No Retinopathy    Comment: Abstracted by HIM  Hemoglobin A1c     Status: None   Collection Time: 02/12/24 12:00 AM  Result Value Ref Range   Hemoglobin A1C 6.5     Comment: [facility]  POC SARS Coronavirus 2 Ag     Status: Abnormal   Collection Time: 02/15/24  2:43 PM  Result Value Ref Range   SARS Coronavirus 2 Ag Positive (A) Negative  CBC     Status: Abnormal   Collection Time: 02/17/24  9:29 AM  Result Value Ref Range   WBC 5.6 3.4 - 10.8 x10E3/uL   RBC 3.91 3.77 - 5.28 x10E6/uL   Hemoglobin 12.9 11.1 - 15.9 g/dL   Hematocrit 61.2 65.9 - 46.6 %   MCV 99 (H) 79 - 97 fL   MCH 33.0 26.6 - 33.0 pg   MCHC 33.3 31.5 - 35.7 g/dL   RDW 87.3 88.2 - 84.5 %   Platelets 120 (L) 150 - 450 x10E3/uL  Basic metabolic panel with GFR     Status: Abnormal   Collection Time: 02/17/24  9:29 AM  Result Value Ref Range   Glucose 165 (H) 70 - 99 mg/dL   BUN 24 8 - 27 mg/dL   Creatinine, Ser 8.75 (H) 0.57 - 1.00 mg/dL   eGFR 44 (L) >40 fO/fpw/8.26   BUN/Creatinine Ratio 19 12 - 28   Sodium 137 134 - 144 mmol/L   Potassium 3.3 (L) 3.5 - 5.2 mmol/L   Chloride 96 96 - 106 mmol/L   CO2 25 20 - 29 mmol/L  Calcium  8.1 (L) 8.7 - 10.3 mg/dL  Basic metabolic panel with GFR     Status: Abnormal   Collection Time: 02/26/24  9:36 AM  Result Value Ref Range   Glucose 202 (H) 70 - 99 mg/dL   BUN 17 8 - 27 mg/dL   Creatinine, Ser 8.64 (H) 0.57 - 1.00 mg/dL   eGFR 39 (L) >40 fO/fpw/8.26   BUN/Creatinine Ratio 13 12 - 28   Sodium 140 134 - 144 mmol/L   Potassium 3.8 3.5 - 5.2 mmol/L   Chloride 93 (L) 96 - 106 mmol/L   CO2 26 20 - 29 mmol/L   Calcium  9.3 8.7 - 10.3 mg/dL  Magnesium      Status: Abnormal   Collection Time: 02/26/24  9:36 AM  Result Value Ref Range   Magnesium  2.4 (H) 1.6 - 2.3 mg/dL  CBC with Differential/Platelet     Status: Abnormal   Collection Time: 03/16/24  9:51 AM  Result Value Ref Range   WBC 7.3 3.8 - 10.8 Thousand/uL   RBC 3.73 (L) 3.80 - 5.10 Million/uL    Hemoglobin 12.4 11.7 - 15.5 g/dL   HCT 61.9 64.9 - 54.9 %   MCV 101.9 (H) 80.0 - 100.0 fL   MCH 33.2 (H) 27.0 - 33.0 pg   MCHC 32.6 32.0 - 36.0 g/dL    Comment: For adults, a slight decrease in the calculated MCHC value (in the range of 30 to 32 g/dL) is most likely not clinically significant; however, it should be interpreted with caution in correlation with other red cell parameters and the patient's clinical condition.    RDW 12.2 11.0 - 15.0 %   Platelets 182 140 - 400 Thousand/uL   MPV 11.3 7.5 - 12.5 fL   Neutro Abs 5,314 1,500 - 7,800 cells/uL   Absolute Lymphocytes 1,044 850 - 3,900 cells/uL   Absolute Monocytes 818 200 - 950 cells/uL   Eosinophils Absolute 88 15 - 500 cells/uL   Basophils Absolute 37 0 - 200 cells/uL   Neutrophils Relative % 72.8 %   Total Lymphocyte 14.3 %   Monocytes Relative 11.2 %   Eosinophils Relative 1.2 %   Basophils Relative 0.5 %  Comprehensive metabolic panel with GFR     Status: Abnormal   Collection Time: 03/16/24  9:51 AM  Result Value Ref Range   Glucose, Bld 106 (H) 65 - 99 mg/dL    Comment: .            Fasting reference interval . For someone without known diabetes, a glucose value between 100 and 125 mg/dL is consistent with prediabetes and should be confirmed with a follow-up test. .    BUN 19 7 - 25 mg/dL   Creat 8.61 (H) 9.39 - 0.95 mg/dL   eGFR 38 (L) > OR = 60 mL/min/1.35m2   BUN/Creatinine Ratio 14 6 - 22 (calc)   Sodium 140 135 - 146 mmol/L   Potassium 4.3 3.5 - 5.3 mmol/L   Chloride 96 (L) 98 - 110 mmol/L   CO2 36 (H) 20 - 32 mmol/L   Calcium  9.3 8.6 - 10.4 mg/dL   Total Protein 6.3 6.1 - 8.1 g/dL   Albumin 3.9 3.6 - 5.1 g/dL   Globulin 2.4 1.9 - 3.7 g/dL (calc)   AG Ratio 1.6 1.0 - 2.5 (calc)   Total Bilirubin 0.7 0.2 - 1.2 mg/dL   Alkaline phosphatase (APISO) 59 37 - 153 U/L   AST 34 10 - 35 U/L  ALT 31 (H) 6 - 29 U/L  VITAMIN D  25 Hydroxy (Vit-D Deficiency, Fractures)     Status: None   Collection Time:  03/16/24  9:51 AM  Result Value Ref Range   Vit D, 25-Hydroxy 95 30 - 100 ng/mL    Comment: Vitamin D  Status         25-OH Vitamin D : . Deficiency:                    <20 ng/mL Insufficiency:             20 - 29 ng/mL Optimal:                 > or = 30 ng/mL . For 25-OH Vitamin D  testing on patients on  D2-supplementation and patients for whom quantitation  of D2 and D3 fractions is required, the [facility](TM) 25-OH VIT D, (D2,D3), LC/MS/MS is recommended: order  code 07111 (patients >66yrs). . See Note 1 . Note 1 . For additional information, please refer to  http://education.[facility].com/faq/FAQ199  (This link is being provided for informational/ educational purposes only.)     Diabetic Foot Exam:     PHQ2/9:    03/16/2024    9:00 AM 09/13/2023   10:35 AM 05/02/2023   10:34 AM 02/14/2023    9:03 AM 08/16/2022    8:33 AM  Depression screen PHQ 2/9  Decreased Interest 0 0 0 0 0  Down, Depressed, Hopeless 0 0 0 0 0  PHQ - 2 Score 0 0 0 0 0  Altered sleeping  0 0 0 0  Tired, decreased energy  0 0 0 0  Change in appetite  0 0 0 0  Feeling bad or failure about yourself   0 0 0 0  Trouble concentrating  0 0 0 0  Moving slowly or fidgety/restless  0 0 0 0  Suicidal thoughts  0 0 0 0  PHQ-9 Score  0 0 0 0  Difficult doing work/chores  Not difficult at all Not difficult at all      phq 9 is negative  Fall Risk:    03/16/2024    9:00 AM 09/13/2023   10:35 AM 05/02/2023   10:38 AM 02/14/2023    9:03 AM 08/16/2022    8:33 AM  Fall Risk   Falls in the past year? 0 0 0 0 0  Number falls in past yr: 0 0 0 0 0  Injury with Fall? 0 0 0 0 0  Risk for fall due to : No Fall Risks No Fall Risks No Fall Risks No Fall Risks No Fall Risks  Follow up Falls evaluation completed Falls prevention discussed;Education provided;Falls evaluation completed Falls prevention discussed;Falls evaluation completed Falls prevention discussed Falls prevention discussed    Assessment  & Plan  1. Dyslipidemia associated with type 2 diabetes mellitus (HCC) (Primary)  - Comprehensive metabolic panel with GFR Keep visits with Endo  A1C is at goal   2. AF (paroxysmal atrial fibrillation) (HCC)  Rate controlled at this time  3. Atherosclerosis of renal artery (HCC)  Monitored by cardiologist   4. Non-ischemic cardiomyopathy (HCC)  Monitored by cardiologist   5. Chronic combined systolic and diastolic congestive heart failure [facility])  Seeing Cardiologist and is doing well   6. Atherosclerosis of abdominal aorta (HCC)  On statin therapy   7. Thrombocytopenia (HCC)  - CBC with Differential/Platelet Noticed during recent labs, we will recheck it today   8. Age-related osteoporosis without  current pathological fracture  - VITAMIN D  25 Hydroxy (Vit-D Deficiency, Fractures) Continue Prolia    9. Chronic bilateral low back pain without sciatica  stable  10. Yeast vaginitis  - fluconazole  (DIFLUCAN ) 150 MG tablet; Take 1 tablet (150 mg total) by mouth every other day. Prn  Dispense: 9 tablet; Refill: 1  She had COVID recently followed by antibiotics and is now having a flare of yeast vaginitis   11. Tremors of nervous system   Noticed on right hand only, discussed referral to neurologist but she would like to hold off for now

## 2024-03-17 ENCOUNTER — Ambulatory Visit: Payer: Self-pay | Admitting: Family Medicine

## 2024-03-17 LAB — CBC WITH DIFFERENTIAL/PLATELET
Absolute Lymphocytes: 1044 {cells}/uL (ref 850–3900)
Absolute Monocytes: 818 {cells}/uL (ref 200–950)
Basophils Absolute: 37 {cells}/uL (ref 0–200)
Basophils Relative: 0.5 %
Eosinophils Absolute: 88 {cells}/uL (ref 15–500)
Eosinophils Relative: 1.2 %
HCT: 38 % (ref 35.0–45.0)
Hemoglobin: 12.4 g/dL (ref 11.7–15.5)
MCH: 33.2 pg — ABNORMAL HIGH (ref 27.0–33.0)
MCHC: 32.6 g/dL (ref 32.0–36.0)
MCV: 101.9 fL — ABNORMAL HIGH (ref 80.0–100.0)
MPV: 11.3 fL (ref 7.5–12.5)
Monocytes Relative: 11.2 %
Neutro Abs: 5314 {cells}/uL (ref 1500–7800)
Neutrophils Relative %: 72.8 %
Platelets: 182 Thousand/uL (ref 140–400)
RBC: 3.73 Million/uL — ABNORMAL LOW (ref 3.80–5.10)
RDW: 12.2 % (ref 11.0–15.0)
Total Lymphocyte: 14.3 %
WBC: 7.3 Thousand/uL (ref 3.8–10.8)

## 2024-03-17 LAB — COMPREHENSIVE METABOLIC PANEL WITH GFR
AG Ratio: 1.6 (calc) (ref 1.0–2.5)
ALT: 31 U/L — ABNORMAL HIGH (ref 6–29)
AST: 34 U/L (ref 10–35)
Albumin: 3.9 g/dL (ref 3.6–5.1)
Alkaline phosphatase (APISO): 59 U/L (ref 37–153)
BUN/Creatinine Ratio: 14 (calc) (ref 6–22)
BUN: 19 mg/dL (ref 7–25)
CO2: 36 mmol/L — ABNORMAL HIGH (ref 20–32)
Calcium: 9.3 mg/dL (ref 8.6–10.4)
Chloride: 96 mmol/L — ABNORMAL LOW (ref 98–110)
Creat: 1.38 mg/dL — ABNORMAL HIGH (ref 0.60–0.95)
Globulin: 2.4 g/dL (ref 1.9–3.7)
Glucose, Bld: 106 mg/dL — ABNORMAL HIGH (ref 65–99)
Potassium: 4.3 mmol/L (ref 3.5–5.3)
Sodium: 140 mmol/L (ref 135–146)
Total Bilirubin: 0.7 mg/dL (ref 0.2–1.2)
Total Protein: 6.3 g/dL (ref 6.1–8.1)
eGFR: 38 mL/min/1.73m2 — ABNORMAL LOW (ref >=60)

## 2024-03-17 LAB — VITAMIN D 25 HYDROXY (VIT D DEFICIENCY, FRACTURES): Vit D, 25-Hydroxy: 95 ng/mL (ref 30–100)

## 2024-03-18 ENCOUNTER — Telehealth (HOSPITAL_COMMUNITY): Payer: Self-pay

## 2024-03-18 NOTE — Telephone Encounter (Signed)
 Spoke with patient to complete pre-procedure call.     Health status review:  Any new medical conditions, recent signs of acute illness or been started on antibiotics? Diagnosed with Covid on 9/1. Patient reports other than low energy level, she has recovered well. Any recent hospitalizations or surgeries? No Any new medications started? No  Follow all medication instructions prior to procedure or the procedure may be rescheduled:    Continue taking Eliquis  (Apixaban ) twice daily without missing any doses before procedure. Essential chronic medications:  No medication should be continued, unless told otherwise. HOLD: Tirzepatide (Mounjaro, Zepbound) for 1 week prior to the procedure. Last dose on Sunday, September 28.  HOLD: Empagliflozin  (Jardiance ) for 3 days prior to the procedure. Last dose on Friday, October 03.  Adjust Lantus  (Insulin ):  The morning of your procedure you will only take  of your usual dose.  Adjust Humalog  Kwikpen (Insulin ): Do not take the bedtime dose the day before your procedure. The day of your procedure if your CBG is greater than 220 mg/dL, you may take 1/2 of your usual dose.  On the morning of your procedure DO NOT take any medication., including Eliquis  (Apixaban ).  Nothing to eat or drink after midnight prior to your procedure.  Pre-procedure testing scheduled: CT completed on August 27 and lab work completed on September 15.  Confirmed patient is scheduled for Atrial Fibrillation Ablation on Tuesday, October 7 with Dr. Ole Holts. Instructed patient to arrive at the Main Entrance A at [facility]: [address] [place], [place] [address] and check in at Admitting at 10:30 AM.  Advised of plan to go home the same day and will only stay overnight if medically necessary. You MUST have a responsible adult to drive you home and MUST be with you the first 24 hours after you arrive home or your procedure could be cancelled.  Informed patient a nurse  will call a day before the procedure to confirm arrival time and ensure instructions are followed.  Patient verbalized understanding to information provided and is agreeable to proceed with procedure.   Advised patient to contact RN Navigator at [phone], to inform of any new medications started after call or concerns prior to procedure.

## 2024-03-19 ENCOUNTER — Encounter (HOSPITAL_COMMUNITY): Payer: Self-pay

## 2024-03-30 DIAGNOSIS — M9901 Segmental and somatic dysfunction of cervical region: Secondary | ICD-10-CM | POA: Diagnosis not present

## 2024-03-30 DIAGNOSIS — M5416 Radiculopathy, lumbar region: Secondary | ICD-10-CM | POA: Diagnosis not present

## 2024-03-30 DIAGNOSIS — M9903 Segmental and somatic dysfunction of lumbar region: Secondary | ICD-10-CM | POA: Diagnosis not present

## 2024-03-30 DIAGNOSIS — M5033 Other cervical disc degeneration, cervicothoracic region: Secondary | ICD-10-CM | POA: Diagnosis not present

## 2024-04-01 ENCOUNTER — Other Ambulatory Visit: Payer: Self-pay | Admitting: Family Medicine

## 2024-04-01 ENCOUNTER — Other Ambulatory Visit: Payer: Self-pay | Admitting: Urology

## 2024-04-01 DIAGNOSIS — B356 Tinea cruris: Secondary | ICD-10-CM

## 2024-04-01 DIAGNOSIS — I129 Hypertensive chronic kidney disease with stage 1 through stage 4 chronic kidney disease, or unspecified chronic kidney disease: Secondary | ICD-10-CM

## 2024-04-01 DIAGNOSIS — N952 Postmenopausal atrophic vaginitis: Secondary | ICD-10-CM

## 2024-04-06 ENCOUNTER — Ambulatory Visit (INDEPENDENT_AMBULATORY_CARE_PROVIDER_SITE_OTHER): Admitting: Podiatry

## 2024-04-06 DIAGNOSIS — E119 Type 2 diabetes mellitus without complications: Secondary | ICD-10-CM | POA: Diagnosis not present

## 2024-04-06 DIAGNOSIS — Q828 Other specified congenital malformations of skin: Secondary | ICD-10-CM | POA: Diagnosis not present

## 2024-04-06 NOTE — Pre-Procedure Instructions (Signed)
 Attempted to call patient regarding procedure instructions.  Left voicemail on the following items: Arrival time 1000 Nothing to eat or drink after midnight No meds AM of procedure Responsible person to drive you home and stay with you for 24 hrs  Have you missed any doses of anti-coagulant Eliquis - should be taken twice a day, if you have missed any doses please let us  know.  Don't take dose morning of procedure.

## 2024-04-07 ENCOUNTER — Ambulatory Visit (HOSPITAL_COMMUNITY): Payer: Self-pay | Admitting: Anesthesiology

## 2024-04-07 ENCOUNTER — Ambulatory Visit (HOSPITAL_COMMUNITY)
Admission: RE | Admit: 2024-04-07 | Discharge: 2024-04-07 | Disposition: A | Discharge status: Home / Self Care | Attending: Cardiology | Admitting: Cardiology

## 2024-04-07 ENCOUNTER — Encounter (HOSPITAL_COMMUNITY): Payer: Self-pay | Admitting: Cardiology

## 2024-04-07 ENCOUNTER — Ambulatory Visit (HOSPITAL_COMMUNITY)
Admission: RE | Disposition: A | Discharge status: Home / Self Care | Payer: Self-pay | Source: Home / Self Care | Attending: Cardiology

## 2024-04-07 ENCOUNTER — Other Ambulatory Visit: Payer: Self-pay

## 2024-04-07 DIAGNOSIS — Z79899 Other long term (current) drug therapy: Secondary | ICD-10-CM | POA: Diagnosis not present

## 2024-04-07 DIAGNOSIS — Z7985 Long-term (current) use of injectable non-insulin antidiabetic drugs: Secondary | ICD-10-CM | POA: Insufficient documentation

## 2024-04-07 DIAGNOSIS — Z7984 Long term (current) use of oral hypoglycemic drugs: Secondary | ICD-10-CM | POA: Insufficient documentation

## 2024-04-07 DIAGNOSIS — E119 Type 2 diabetes mellitus without complications: Secondary | ICD-10-CM

## 2024-04-07 DIAGNOSIS — E785 Hyperlipidemia, unspecified: Secondary | ICD-10-CM | POA: Insufficient documentation

## 2024-04-07 DIAGNOSIS — I701 Atherosclerosis of renal artery: Secondary | ICD-10-CM | POA: Insufficient documentation

## 2024-04-07 DIAGNOSIS — Z7901 Long term (current) use of anticoagulants: Secondary | ICD-10-CM | POA: Diagnosis not present

## 2024-04-07 DIAGNOSIS — I509 Heart failure, unspecified: Secondary | ICD-10-CM | POA: Diagnosis not present

## 2024-04-07 DIAGNOSIS — I4819 Other persistent atrial fibrillation: Secondary | ICD-10-CM | POA: Diagnosis not present

## 2024-04-07 DIAGNOSIS — Z794 Long term (current) use of insulin: Secondary | ICD-10-CM | POA: Insufficient documentation

## 2024-04-07 DIAGNOSIS — M810 Age-related osteoporosis without current pathological fracture: Secondary | ICD-10-CM | POA: Insufficient documentation

## 2024-04-07 DIAGNOSIS — I110 Hypertensive heart disease with heart failure: Secondary | ICD-10-CM | POA: Insufficient documentation

## 2024-04-07 DIAGNOSIS — I4891 Unspecified atrial fibrillation: Secondary | ICD-10-CM | POA: Diagnosis not present

## 2024-04-07 DIAGNOSIS — I5042 Chronic combined systolic (congestive) and diastolic (congestive) heart failure: Secondary | ICD-10-CM | POA: Diagnosis not present

## 2024-04-07 DIAGNOSIS — I2510 Atherosclerotic heart disease of native coronary artery without angina pectoris: Secondary | ICD-10-CM | POA: Insufficient documentation

## 2024-04-07 HISTORY — PX: ATRIAL FIBRILLATION ABLATION: EP1191

## 2024-04-07 LAB — GLUCOSE, CAPILLARY: Glucose-Capillary: 107 mg/dL — ABNORMAL HIGH (ref 70–99)

## 2024-04-07 LAB — POCT ACTIVATED CLOTTING TIME: Activated Clotting Time: 371 s

## 2024-04-07 SURGERY — ATRIAL FIBRILLATION ABLATION
Anesthesia: General

## 2024-04-07 MED ORDER — ONDANSETRON HCL 4 MG/2ML IJ SOLN: 4.0000 mg | Freq: Four times a day (QID) | INTRAMUSCULAR | Status: DC | PRN

## 2024-04-07 MED ORDER — SODIUM CHLORIDE 0.9% FLUSH
3.0000 mL | Freq: Two times a day (BID) | INTRAVENOUS | Status: DC
Start: 2024-04-07 — End: 2024-04-07

## 2024-04-07 MED ORDER — HEPARIN (PORCINE) IN NACL 1000-0.9 UT/500ML-% IV SOLN
INTRAVENOUS | Status: DC | PRN
  Administered 2024-04-07 (×3): 500 mL

## 2024-04-07 MED ORDER — APIXABAN 5 MG PO TABS
5.0000 mg | ORAL_TABLET | Freq: Two times a day (BID) | ORAL | Status: DC
Start: 2024-04-07 — End: 2024-04-07
  Administered 2024-04-07: 5 mg via ORAL
  Filled 2024-04-07: qty 1

## 2024-04-07 MED ORDER — SODIUM CHLORIDE 0.9% FLUSH: 3.0000 mL | INTRAVENOUS | Status: DC | PRN

## 2024-04-07 MED ORDER — PHENYLEPHRINE HCL-NACL 20-0.9 MG/250ML-% IV SOLN
INTRAVENOUS | Status: DC | PRN
  Administered 2024-04-07: 15 ug/min via INTRAVENOUS

## 2024-04-07 MED ORDER — FENTANYL CITRATE (PF) 100 MCG/2ML IJ SOLN
INTRAMUSCULAR | Status: AC
  Filled 2024-04-07: qty 2

## 2024-04-07 MED ORDER — GLYCOPYRROLATE PF 0.2 MG/ML IJ SOSY
PREFILLED_SYRINGE | INTRAMUSCULAR | Status: DC | PRN
  Administered 2024-04-07: .1 mg via INTRAVENOUS

## 2024-04-07 MED ORDER — DEXAMETHASONE SODIUM PHOSPHATE 10 MG/ML IJ SOLN
INTRAMUSCULAR | Status: DC | PRN
  Administered 2024-04-07: 10 mg via INTRAVENOUS

## 2024-04-07 MED ORDER — SODIUM CHLORIDE 0.9 % IV SOLN: 250.0000 mL | INTRAVENOUS | Status: DC | PRN

## 2024-04-07 MED ORDER — SUGAMMADEX SODIUM 200 MG/2ML IV SOLN
INTRAVENOUS | Status: DC | PRN
  Administered 2024-04-07: 200 mg via INTRAVENOUS

## 2024-04-07 MED ORDER — LIDOCAINE 2% (20 MG/ML) 5 ML SYRINGE
INTRAMUSCULAR | Status: DC | PRN
  Administered 2024-04-07: 80 mg via INTRAVENOUS
  Administered 2024-04-07: 20 mg via INTRAVENOUS

## 2024-04-07 MED ORDER — SODIUM CHLORIDE 0.9 % IV SOLN: INTRAVENOUS | Status: DC | PRN

## 2024-04-07 MED ORDER — HEPARIN SODIUM (PORCINE) 1000 UNIT/ML IJ SOLN
INTRAMUSCULAR | Status: DC | PRN
Start: 2024-04-07 — End: 2024-04-07
  Administered 2024-04-07: 10000 [IU] via INTRAVENOUS

## 2024-04-07 MED ORDER — ACETAMINOPHEN 500 MG PO TABS
1000.0000 mg | ORAL_TABLET | Freq: Once | ORAL | Status: AC
  Administered 2024-04-07: 1000 mg via ORAL
  Filled 2024-04-07: qty 2

## 2024-04-07 MED ORDER — EPHEDRINE SULFATE-NACL 50-0.9 MG/10ML-% IV SOSY
PREFILLED_SYRINGE | INTRAVENOUS | Status: DC | PRN
  Administered 2024-04-07 (×2): 5 mg via INTRAVENOUS

## 2024-04-07 MED ORDER — FENTANYL CITRATE (PF) 250 MCG/5ML IJ SOLN
INTRAMUSCULAR | Status: DC | PRN
  Administered 2024-04-07: 25 ug via INTRAVENOUS
  Administered 2024-04-07: 50 ug via INTRAVENOUS
  Administered 2024-04-07: 25 ug via INTRAVENOUS

## 2024-04-07 MED ORDER — PROPOFOL 500 MG/50ML IV EMUL
INTRAVENOUS | Status: DC | PRN
  Administered 2024-04-07: 100 ug/kg/min via INTRAVENOUS

## 2024-04-07 MED ORDER — ONDANSETRON HCL 4 MG/2ML IJ SOLN
INTRAMUSCULAR | Status: DC | PRN
Start: 2024-04-07 — End: 2024-04-07
  Administered 2024-04-07: 4 mg via INTRAVENOUS

## 2024-04-07 MED ORDER — PROPOFOL 10 MG/ML IV BOLUS
INTRAVENOUS | Status: DC | PRN
  Administered 2024-04-07: 100 mg via INTRAVENOUS

## 2024-04-07 MED ORDER — PROTAMINE SULFATE 10 MG/ML IV SOLN
INTRAVENOUS | Status: DC | PRN
  Administered 2024-04-07: 30 mg via INTRAVENOUS

## 2024-04-07 MED ORDER — SODIUM CHLORIDE 0.9 % IV SOLN: INTRAVENOUS | Status: DC

## 2024-04-07 MED ORDER — ACETAMINOPHEN 325 MG PO TABS: 650.0000 mg | ORAL_TABLET | ORAL | Status: DC | PRN

## 2024-04-07 MED ORDER — ROCURONIUM BROMIDE 10 MG/ML (PF) SYRINGE
PREFILLED_SYRINGE | INTRAVENOUS | Status: DC | PRN
  Administered 2024-04-07: 20 mg via INTRAVENOUS
  Administered 2024-04-07: 50 mg via INTRAVENOUS

## 2024-04-07 SURGICAL SUPPLY — 17 items
CABLE FARASTAR GEN2 SNGL USE (CABLE) IMPLANT
CATH FARAWAVE 2.0 31 (CATHETERS) IMPLANT
CATH GE 8FR SOUNDSTAR (CATHETERS) IMPLANT
CATH OCTARAY 2.0 F 3-3-3-3-3 (CATHETERS) IMPLANT
CATH WEBSTER BI DIR CS D-F CRV (CATHETERS) IMPLANT
CLOSURE PERCLOSE PROSTYLE (Vascular Products) IMPLANT
COVER SWIFTLINK CONNECTOR (BAG) ×1 IMPLANT
DILATOR VESSEL 38 20CM 16FR (INTRODUCER) IMPLANT
GUIDEWIRE INQWIRE 1.5J.035X260 (WIRE) IMPLANT
KIT VERSACROSS CNCT FARADRIVE (KITS) IMPLANT
PACK EP LF (CUSTOM PROCEDURE TRAY) ×1 IMPLANT
PAD DEFIB RADIO PHYSIO CONN (PAD) ×1 IMPLANT
PATCH CARTO3 (PAD) IMPLANT
SHEATH FARADRIVE STEERABLE (SHEATH) IMPLANT
SHEATH PINNACLE 8F 10CM (SHEATH) IMPLANT
SHEATH PINNACLE 9F 10CM (SHEATH) IMPLANT
SHEATH PROBE COVER 6X72 (BAG) IMPLANT

## 2024-04-07 NOTE — Anesthesia Preprocedure Evaluation (Addendum)
 Anesthesia Evaluation  Patient identified by MRN, date of birth, ID band Patient awake    Reviewed: Allergy & Precautions, NPO status , Patient's Chart, lab work & pertinent test results  History of Anesthesia Complications Negative for: history of anesthetic complications  Airway Mallampati: III  TM Distance: >3 FB Neck ROM: Full   Comment: Previous grade I view with MAC 3 Dental  (+) Dental Advisory Given   Pulmonary neg pulmonary ROS   Pulmonary exam normal breath sounds clear to auscultation       Cardiovascular hypertension (carvedilol ), Pt. on home beta blockers (-) angina + CAD (non-obstructive) and +CHF  (-) Past MI, (-) Cardiac Stents and (-) CABG + dysrhythmias Atrial Fibrillation  Rhythm:Regular Rate:Normal  HLD  TTE 11/26/2023: IMPRESSIONS    1. Left ventricular ejection fraction, by estimation, is 65 to 70%. The  left ventricle has normal function. The left ventricle has no regional  wall motion abnormalities. Left ventricular diastolic parameters are  consistent with Grade III diastolic  dysfunction (restrictive). Elevated left atrial pressure.   2. Right ventricular systolic function is mildly reduced. The right  ventricular size is normal. There is mildly elevated pulmonary artery  systolic pressure.   3. Left atrial size was severely dilated.   4. The mitral valve is degenerative. Mild mitral valve regurgitation. No  evidence of mitral stenosis. Moderate mitral annular calcification.   5. The aortic valve is tricuspid. There is mild thickening of the aortic  valve. Aortic valve regurgitation is not visualized. Aortic valve  sclerosis is present, with no evidence of aortic valve stenosis.   6. The inferior vena cava is normal in size with <50% respiratory  variability, suggesting right atrial pressure of 8 mmHg.     Neuro/Psych neg Seizures  Neuromuscular disease (spinal stenosis)    GI/Hepatic Neg liver  ROS,GERD  ,,  Endo/Other  diabetes, Type 2, Insulin  Dependent    Renal/GU Renal disease (renal artery stenosis)     Musculoskeletal  (+) Arthritis ,  Osteoporosis    Abdominal   Peds  Hematology negative hematology ROS (+)   Anesthesia Other Findings Last Eliquis : last night  Last Jardiance : 04/03/2024  Last Mounjaro: 03/29/2024  Reproductive/Obstetrics                              Anesthesia Physical Anesthesia Plan  ASA: 3  Anesthesia Plan: General   Post-op Pain Management: Tylenol  PO (pre-op)*   Induction: Intravenous  PONV Risk Score and Plan: 3 and Ondansetron , Dexamethasone and Treatment may vary due to age or medical condition  Airway Management Planned: Oral ETT  Additional Equipment:   Intra-op Plan:   Post-operative Plan: Extubation in OR  Informed Consent: I have reviewed the patients History and Physical, chart, labs and discussed the procedure including the risks, benefits and alternatives for the proposed anesthesia with the patient or authorized representative who has indicated his/her understanding and acceptance.     Dental advisory given  Plan Discussed with: CRNA and Anesthesiologist  Anesthesia Plan Comments: (Risks of general anesthesia discussed including, but not limited to, sore throat, hoarse voice, chipped/damaged teeth, injury to vocal cords, nausea and vomiting, allergic reactions, lung infection, heart attack, stroke, and death. All questions answered. )         Anesthesia Quick Evaluation

## 2024-04-07 NOTE — Progress Notes (Signed)
 Patient ambulated to the bathroom and voided. Bilateral groin sites clean, dry, and intact. No bleeding or hematoma noted to sites. Discharge instructions received with no concerns voiced.

## 2024-04-07 NOTE — Progress Notes (Signed)
 Patient had a soft blood pressure, DR Cindie came to bedside. Bedside echo completed no issues found.

## 2024-04-07 NOTE — Anesthesia Postprocedure Evaluation (Signed)
 Anesthesia Post Note  Patient: Bonnie Rowland  Procedure(s) Performed: ATRIAL FIBRILLATION ABLATION     Patient location during evaluation: PACU Anesthesia Type: General Level of consciousness: awake and alert Pain management: pain level controlled Vital Signs Assessment: post-procedure vital signs reviewed and stable Respiratory status: spontaneous breathing, nonlabored ventilation and respiratory function stable Cardiovascular status: blood pressure returned to baseline Postop Assessment: no apparent nausea or vomiting Anesthetic complications: no   There were no known notable events for this encounter.  Last Vitals:  Vitals:   04/07/24 1400 04/07/24 1405  BP: (!) 91/45 (!) 98/42  Pulse: (!) 56 (!) 57  Resp: 17 14  Temp:    SpO2: 97% 96%    Last Pain:  Vitals:   04/07/24 1037  TempSrc: Oral   Pain Goal:                   Vertell Row

## 2024-04-07 NOTE — H&P (Signed)
 Electrophysiology Office Follow up Visit Note:     Date:  04/07/2024    ID:  Bonnie Rowland, DOB [birth year], MRN [id number]   PCP:  Glenard Mire, MD     [facility] Cardiologist:  Bonnie Lunger, MD  [facility] Electrophysiologist:  Bonnie ONEIDA HOLTS, MD      Interval History:       Bonnie Rowland is a 83 y.o. female who presents for a follow up visit.    She presents for follow-up after recent hospitalization for atrial fibrillation.  She has a long history of atrial fibrillation with a prior catheter ablation in March 2024 during which the veins, posterior wall and CTI were ablated.   She was admitted May 27 through May 29.  She was admitted with symptomatic atrial fibrillation.  Her atrial fibrillation was rapidly conducted and associated with hypotension and chest pain.  She was started on amiodarone  which converted her back to sinus rhythm.  She had a stress test which was not suggestive of ischemia.   She is with her husband in clinic.  She is overall tolerating the amiodarone  but does report increase hair loss and tremor that are both new since this.  She would like to avoid indefinite use of amiodarone  if at all possible.  She does report 2 shorter episodes of atrial fibrillation that occurred before her more significant episode May 27.  She is interested in redo catheter ablation in an effort to stop her amiodarone .  Presents for AF ablation. Procedure reviewed.   Objective Past medical, surgical, social and family history were reviewed.   ROS:   Please see the history of present illness.    All other systems reviewed and are negative.   EKGs/Labs/Other Studies Reviewed:     The following studies were reviewed today:   Nov 26, 2023 echo EF 65-70 RV mildly reduced Severely dilated left atrium Mild MR   Nov 26, 2023 EKG shows atrial fibrillation with rapid ventricular rate.   December 03, 2023 EKG shows sinus rhythm.  QTc 453 ms     EKG  Interpretation Date/Time:                  Wednesday December 25 2023 10:16:15 EDT Ventricular Rate:         57 PR Interval:                 174 QRS Duration:             74 QT Interval:                 482 QTC Calculation:469 R Axis:                         116   Text Interpretation:Sinus bradycardia Right axis deviation Low voltage QRS Confirmed by Rowland Bonnie [phone]) on 12/25/2023 10:22:30 AM     Physical Exam:     VS:  BP 157/41   Pulse 59   Ht 5' 3 (1.6 m)   Wt 147 lb 9.6 oz (67 kg)   SpO2 96%   BMI 26.15 kg/m         Wt Readings from Last 3 Encounters:  12/25/23 147 lb 9.6 oz (67 kg)  12/03/23 145 lb 6.4 oz (66 kg)  11/26/23 142 lb 3.2 oz (64.5 kg)      GEN: no distress CARD: RRR, No MRG RESP: No IWOB. CTAB.     Assessment  ASSESSMENT:     1. Persistent atrial fibrillation (HCC)   2. Encounter for long-term (current) use of high-risk medication     PLAN:     In order of problems listed above:   #Persistent atrial fibrillation #High risk drug monitoring-amiodarone  Symptomatic.  Prior catheter ablation in March 2024.   I discussed treatment options with the patient including continuing amiodarone  versus pursuing redo catheter ablation.   Discussed treatment options today for AF including antiarrhythmic drug therapy and ablation. Discussed risks, recovery and likelihood of success with each treatment strategy. Risk, benefits, and alternatives to EP study and ablation for afib were discussed. These risks include but are not limited to stroke, bleeding, vascular damage, tamponade, perforation, damage to the esophagus, lungs, phrenic nerve and other structures, pulmonary vein stenosis, worsening renal function, coronary vasospasm and death.  Discussed potential need for repeat ablation procedures and antiarrhythmic drugs after an initial ablation. The patient understands these risk and wishes to proceed.  We will therefore proceed with catheter ablation at the next  available time.  Carto, ICE, anesthesia are requested for the procedure.  Will also obtain CT PV protocol prior to the procedure to exclude LAA thrombus and further evaluate atrial anatomy.   Presents for AF ablation today. Procedure reviewed.   Signed, Bonnie Holts, MD, [facility], [facility] 04/07/2024 Electrophysiology [facility]

## 2024-04-07 NOTE — Transfer of Care (Signed)
 Immediate Anesthesia Transfer of Care Note  Patient: Bonnie Rowland  Procedure(s) Performed: ATRIAL FIBRILLATION ABLATION  Patient Location: PACU  Anesthesia Type:General  Level of Consciousness: awake, alert , oriented, and drowsy  Airway & Oxygen Therapy: Patient Spontanous Breathing  Post-op Assessment: Report given to RN and Post -op Vital signs reviewed and stable  Post vital signs: Reviewed and stable  Last Vitals:  Vitals Value Taken Time  BP 101/43 04/07/24 13:51  Temp    Pulse 57 04/07/24 13:52  Resp 15 04/07/24 13:53  SpO2 95 % 04/07/24 13:52  Vitals shown include unfiled device data.  Last Pain:  Vitals:   04/07/24 1037  TempSrc: Oral         Complications: There were no known notable events for this encounter.

## 2024-04-07 NOTE — Discharge Instructions (Signed)

## 2024-04-07 NOTE — Anesthesia Procedure Notes (Signed)
 Procedure Name: Intubation Date/Time: 04/07/2024 12:26 PM  Performed by: Mollie Olivia SAUNDERS, CRNAPre-anesthesia Checklist: Patient identified, Emergency Drugs available, Suction available and Patient being monitored Patient Re-evaluated:Patient Re-evaluated prior to induction Oxygen Delivery Method: Circle system utilized Preoxygenation: Pre-oxygenation with 100% oxygen Induction Type: IV induction Ventilation: Mask ventilation without difficulty Laryngoscope Size: Mac and 3 Grade View: Grade I Tube type: Oral Tube size: 7.0 mm Number of attempts: 1 Airway Equipment and Method: Stylet and Oral airway Placement Confirmation: ETT inserted through vocal cords under direct vision, positive ETCO2 and breath sounds checked- equal and bilateral Secured at: 21 cm Tube secured with: Tape Dental Injury: Teeth and Oropharynx as per pre-operative assessment

## 2024-04-08 ENCOUNTER — Telehealth (HOSPITAL_COMMUNITY): Payer: Self-pay

## 2024-04-08 MED FILL — Fentanyl Citrate Preservative Free (PF) Inj 100 MCG/2ML: INTRAMUSCULAR | Qty: 2 | Status: CN

## 2024-04-08 MED FILL — Fentanyl Citrate Preservative Free (PF) Inj 100 MCG/2ML: INTRAMUSCULAR | Qty: 2 | Status: AC

## 2024-04-08 NOTE — Telephone Encounter (Signed)
 Spoke with patient to complete post procedure follow up call.  Patient reports no complications with groin sites.   Instructions reviewed with patient:  Remove large bandage at puncture site after 24 hours. It is normal to have bruising, tenderness, mild swelling, and a pea or marble sized lump/knot at the groin site which can take up to three months to resolve.  Get help right away if you notice sudden swelling at the puncture site.  Check your puncture site every day for signs of infection: fever, redness, swelling, pus drainage, warmth, foul odor or excessive pain. If this occurs, please call [phone], to speak with the RN Navigator. Get help right away if your puncture site is bleeding and the bleeding does not stop after applying firm pressure to the area.  You may continue to have skipped beats/ atrial fibrillation during the first several months after your procedure.  It is very important not to miss any doses of your blood thinner Eliquis .    You will follow up with the APP 4 weeks after your procedure and follow up with the APP 3 months after your procedure.   Patient verbalized understanding to all instructions provided.

## 2024-04-10 ENCOUNTER — Encounter: Payer: Self-pay | Admitting: Podiatry

## 2024-04-10 NOTE — Progress Notes (Signed)
  Subjective:  Patient ID: Bonnie Rowland, female    DOB: [birth year],  MRN: [id number]  Bonnie Rowland presents to clinic today for preventative diabetic foot care and painful porokeratotic lesions right foot. Pain prevent(s) comfortable ambulation. Aggravating factor is weightbearing with and without shoegear. Patient will be having ablation procedure for afib on tomorrow. Chief Complaint  Patient presents with   Toe Pain    RFC. Dr. Glenard is her PCP. A1c 6.5. Last office visit was in Sept 2025   New problem(s): None.   PCP is Sowles, Krichna, MD.  Allergies  Allergen Reactions   Flexeril  [Cyclobenzaprine ] Hypertension   Cheese Other (See Comments)    bloating   Ciprofloxacin Hcl Other (See Comments)    Muscle pain   Coconut (Cocos Nucifera) Other (See Comments)    Upset stomach   Keflex [Cephalexin] Other (See Comments)    Patient prefers not to take this medication due to the side effects, caused tendon issues    Review of Systems: Negative except as noted in the HPI.  Objective: No changes noted in today's physical examination. There were no vitals filed for this visit. Bonnie Rowland is a pleasant 83 y.o. female WD, WN in NAD. AAO x 3.  Vascular Examination: Capillary refill time immediate b/l. Palpable pedal pulses. Pedal hair present b/l. No pain with calf compression b/l. Skin temperature gradient WNL b/l. No cyanosis or clubbing b/l. No ischemia or gangrene noted b/l.   Neurological Examination: Sensation grossly intact b/l with 10 gram monofilament. Vibratory sensation intact b/l.   Dermatological Examination: Pedal skin with normal turgor, texture and tone b/l.  No open wounds. No interdigital macerations.   Toenails 1-5 b/l well maintained with adequate length. No erythema, no edema, no drainage, no fluctuance. Porokeratotic lesion(s) plantarlateral midfoot right foot, plantar heel pad of right foot, and 5th met base right foot. No erythema, no edema,  no drainage, no fluctuance.  Musculoskeletal Examination: Muscle strength 5/5 to all lower extremity muscle groups bilaterally. Muscle strength 5/5 to all lower extremity muscle groups bilaterally. Plantarflexed metatarsal(s) 5th metatarsal head b/l lower extremities.. No pain, crepitus or joint limitation noted with ROM b/l LE.  Patient ambulates independently without assistive aids.  Radiographs: None  Assessment/Plan: 1. Porokeratosis   2. Diabetes mellitus without complication (HCC)   -Consent given for treatment as described below: -Examined patient. -Continue foot and shoe inspections daily. Monitor blood glucose per PCP/Endocrinologist's recommendations. -Patient to continue soft, supportive shoe gear daily. -Porokeratotic lesion(s) right heel, sub 5th met base right foot, and plantarlateral aspect of midfoot right foot pared and enucleated with sterile currette without incident. Total number of lesions debrided=3. -Patient/POA to call should there be question/concern in the interim.   Return in about 9 weeks (around 06/08/2024).  Delon LITTIE Merlin, DPM      [place] LOCATION: [address] [place] [address]                   Office [phone]   [facility] LOCATION: [address] [place], [place] [address] Office [phone]

## 2024-04-12 ENCOUNTER — Other Ambulatory Visit: Payer: Self-pay | Admitting: Family

## 2024-04-13 DIAGNOSIS — M9901 Segmental and somatic dysfunction of cervical region: Secondary | ICD-10-CM | POA: Diagnosis not present

## 2024-04-13 DIAGNOSIS — M5416 Radiculopathy, lumbar region: Secondary | ICD-10-CM | POA: Diagnosis not present

## 2024-04-13 DIAGNOSIS — M5033 Other cervical disc degeneration, cervicothoracic region: Secondary | ICD-10-CM | POA: Diagnosis not present

## 2024-04-13 DIAGNOSIS — M9903 Segmental and somatic dysfunction of lumbar region: Secondary | ICD-10-CM | POA: Diagnosis not present

## 2024-04-15 DIAGNOSIS — E1165 Type 2 diabetes mellitus with hyperglycemia: Secondary | ICD-10-CM | POA: Diagnosis not present

## 2024-04-15 DIAGNOSIS — Z794 Long term (current) use of insulin: Secondary | ICD-10-CM | POA: Diagnosis not present

## 2024-04-20 DIAGNOSIS — E785 Hyperlipidemia, unspecified: Secondary | ICD-10-CM | POA: Diagnosis not present

## 2024-04-20 DIAGNOSIS — N1832 Chronic kidney disease, stage 3b: Secondary | ICD-10-CM | POA: Diagnosis not present

## 2024-04-20 DIAGNOSIS — E876 Hypokalemia: Secondary | ICD-10-CM | POA: Diagnosis not present

## 2024-04-20 DIAGNOSIS — R8290 Unspecified abnormal findings in urine: Secondary | ICD-10-CM | POA: Diagnosis not present

## 2024-04-20 DIAGNOSIS — I959 Hypotension, unspecified: Secondary | ICD-10-CM | POA: Diagnosis not present

## 2024-04-20 DIAGNOSIS — N179 Acute kidney failure, unspecified: Secondary | ICD-10-CM | POA: Diagnosis not present

## 2024-04-20 DIAGNOSIS — E1129 Type 2 diabetes mellitus with other diabetic kidney complication: Secondary | ICD-10-CM | POA: Diagnosis not present

## 2024-04-20 DIAGNOSIS — I509 Heart failure, unspecified: Secondary | ICD-10-CM | POA: Diagnosis not present

## 2024-04-21 ENCOUNTER — Other Ambulatory Visit: Payer: Self-pay | Admitting: Nephrology

## 2024-04-21 DIAGNOSIS — R8290 Unspecified abnormal findings in urine: Secondary | ICD-10-CM

## 2024-04-21 DIAGNOSIS — N1832 Chronic kidney disease, stage 3b: Secondary | ICD-10-CM

## 2024-04-24 DIAGNOSIS — Z23 Encounter for immunization: Secondary | ICD-10-CM | POA: Diagnosis not present

## 2024-04-27 ENCOUNTER — Other Ambulatory Visit: Payer: Self-pay | Admitting: Medical Genetics

## 2024-04-27 ENCOUNTER — Ambulatory Visit
Admission: RE | Admit: 2024-04-27 | Discharge: 2024-04-27 | Disposition: A | Discharge status: Home / Self Care | Source: Ambulatory Visit | Attending: Nephrology | Admitting: Nephrology

## 2024-04-27 DIAGNOSIS — M5033 Other cervical disc degeneration, cervicothoracic region: Secondary | ICD-10-CM | POA: Diagnosis not present

## 2024-04-27 DIAGNOSIS — M9903 Segmental and somatic dysfunction of lumbar region: Secondary | ICD-10-CM | POA: Diagnosis not present

## 2024-04-27 DIAGNOSIS — N1832 Chronic kidney disease, stage 3b: Secondary | ICD-10-CM | POA: Insufficient documentation

## 2024-04-27 DIAGNOSIS — M9901 Segmental and somatic dysfunction of cervical region: Secondary | ICD-10-CM | POA: Diagnosis not present

## 2024-04-27 DIAGNOSIS — M5416 Radiculopathy, lumbar region: Secondary | ICD-10-CM | POA: Diagnosis not present

## 2024-04-27 DIAGNOSIS — N261 Atrophy of kidney (terminal): Secondary | ICD-10-CM | POA: Diagnosis not present

## 2024-04-27 DIAGNOSIS — R8290 Unspecified abnormal findings in urine: Secondary | ICD-10-CM | POA: Diagnosis not present

## 2024-04-27 DIAGNOSIS — N189 Chronic kidney disease, unspecified: Secondary | ICD-10-CM | POA: Diagnosis not present

## 2024-04-27 DIAGNOSIS — Z006 Encounter for examination for normal comparison and control in clinical research program: Secondary | ICD-10-CM

## 2024-05-04 ENCOUNTER — Other Ambulatory Visit: Payer: Self-pay | Admitting: Family Medicine

## 2024-05-04 DIAGNOSIS — M62830 Muscle spasm of back: Secondary | ICD-10-CM

## 2024-05-07 ENCOUNTER — Ambulatory Visit (INDEPENDENT_AMBULATORY_CARE_PROVIDER_SITE_OTHER): Payer: Medicare Other

## 2024-05-07 VITALS — BP 110/60 | Ht 63.0 in | Wt 137.5 lb

## 2024-05-07 DIAGNOSIS — Z0000 Encounter for general adult medical examination without abnormal findings: Secondary | ICD-10-CM

## 2024-05-07 NOTE — Patient Instructions (Addendum)
 Bonnie Rowland,  Thank you for taking the time for your Medicare Wellness Visit. I appreciate your continued commitment to your health goals. Please review the care plan we discussed, and feel free to reach out if I can assist you further.  Please note that Annual Wellness Visits do not include a physical exam. Some assessments may be limited, especially if the visit was conducted virtually. If needed, we may recommend an in-person follow-up with your provider.  Ongoing Care Seeing your primary care provider every 3 to 6 months helps us  monitor your health and provide consistent, personalized care.   Referrals If a referral was made during today's visit and you haven't received any updates within two weeks, please contact the referred provider directly to check on the status.  Recommended Screenings:  Health Maintenance  Topic Date Due   Flu Shot  01/31/2024   COVID-19 Vaccine (10 - 2025-26 season) 03/02/2024   Complete foot exam   07/31/2024   Hemoglobin A1C  08/14/2024   Yearly kidney health urinalysis for diabetes  09/12/2024   Breast Cancer Screening  12/11/2024   Eye exam for diabetics  01/06/2025   Yearly kidney function blood test for diabetes  03/16/2025   Medicare Annual Wellness Visit  05/07/2025   DEXA scan (bone density measurement)  10/09/2025   DTaP/Tdap/Td vaccine (3 - Td or Tdap) 01/20/2032   Pneumococcal Vaccine for age over 58  Completed   Zoster (Shingles) Vaccine  Completed   Meningitis B Vaccine  Aged Out   Hepatitis C Screening  Discontinued       05/07/2024   10:34 AM  Advanced Directives  Does Patient Have a Medical Advance Directive? Yes  Type of Estate Agent of [place];Living will  Does patient want to make changes to medical advance directive? No - Patient declined  Copy of Healthcare Power of Attorney in Chart? Yes - validated most recent copy scanned in chart (See row information)    Vision: Annual vision screenings are  recommended for early detection of glaucoma, cataracts, and diabetic retinopathy. These exams can also reveal signs of chronic conditions such as diabetes and high blood pressure.  Dental: Annual dental screenings help detect early signs of oral cancer, gum disease, and other conditions linked to overall health, including heart disease and diabetes.  Please see the attached documents for additional preventive care recommendations.   NEXT AWV 05/13/25 @ 10:50 AM IN PERSON

## 2024-05-07 NOTE — Progress Notes (Signed)
 Subjective:   Bonnie Rowland is a 83 y.o. female who presents for a Medicare Annual Wellness Visit.  Allergies (verified) Flexeril  [cyclobenzaprine ], Cheese, Ciprofloxacin hcl, Coconut (cocos nucifera), and Keflex [cephalexin]   History: Past Medical History:  Diagnosis Date   Acute cystitis    Acute on chronic combined systolic and diastolic CHF (congestive heart failure) (HCC)    Acute respiratory failure with hypoxia (HCC) 01/23/2021   AF (paroxysmal atrial fibrillation) (HCC) 01/23/2021   Allergic rhinitis, cause unspecified    Arthritis 2015   Atherosclerosis of renal artery    left   Atrial fibrillation (HCC)    Bronchitis, not specified as acute or chronic    Cancer (HCC) 12/2013   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; left shoulder blade   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   Cataract 2003   Cellulitis and abscess of leg, except foot    Conjunctivitis unspecified    Dermatophytosis of nail    Diabetes mellitus    type II   Elevated troponin    Esophageal reflux    Hyperlipidemia    Hypertension    Osteoporosis 2016   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)    Renal artery stenosis    Sprain of lumbar region    Thoracic or lumbosacral neuritis or radiculitis, unspecified    Urinary tract infection, site not specified    Past Surgical History:  Procedure Laterality Date   APPENDECTOMY     ATRIAL FIBRILLATION ABLATION N/A 09/25/2022   Procedure: ATRIAL FIBRILLATION ABLATION;  Surgeon: Cindie Ole DASEN, MD;  [facility];  Service: Cardiovascular;  Laterality: N/A;   ATRIAL FIBRILLATION ABLATION N/A 04/07/2024   Procedure: ATRIAL FIBRILLATION ABLATION;  Surgeon: Cindie Ole DASEN, MD;  Location: [facility];  Service: Cardiovascular;  Laterality: N/A;   BIOPSY  02/28/2023   Procedure: BIOPSY;  Surgeon: Onita Elspeth Sharper, DO;  Location: [facility];  Service: Gastroenterology;;   CARDIAC CATHETERIZATION     CARDIOVERSION N/A 11/30/2020    Procedure: CARDIOVERSION;  Surgeon: Perla Evalene PARAS, MD;  Location: [facility];  Service: Cardiovascular;  Laterality: N/A;   CARDIOVERSION N/A 01/20/2021   Procedure: CARDIOVERSION;  Surgeon: Perla Evalene PARAS, MD;  Location: [facility];  Service: Cardiovascular;  Laterality: N/A;   CARDIOVERSION N/A 01/30/2021   Procedure: CARDIOVERSION;  Surgeon: Darron Deatrice LABOR, MD;  Location: [facility];  Service: Cardiovascular;  Laterality: N/A;   cataract surgery     COLONOSCOPY     COLONOSCOPY N/A 02/28/2023   Procedure: COLONOSCOPY;  Surgeon: Onita Elspeth Sharper, DO;  Location: [facility] ENDOSCOPY;  Service: Gastroenterology;  Laterality: N/A;   COLONOSCOPY WITH PROPOFOL  N/A 05/09/2016   Procedure: COLONOSCOPY WITH PROPOFOL ;  Surgeon: Reyes LELON Cota, MD;  Location: [facility];  Service: Endoscopy;  Laterality: N/A;   DIAGNOSTIC MAMMOGRAM     EYE SURGERY Bilateral    Cataract Extraction   JOINT REPLACEMENT  2016   KNEE ARTHROPLASTY Right 03/30/2015   Procedure: COMPUTER ASSISTED TOTAL KNEE ARTHROPLASTY;  Surgeon: Lynwood SHAUNNA Hue, MD;  Location: [facility];  Service: Orthopedics;  Laterality: Right;   KNEE ARTHROSCOPY Right    KNEE CLOSED REDUCTION Right 05/23/2015   Procedure: CLOSED MANIPULATION KNEE;  Surgeon: Lynwood SHAUNNA Hue, MD;  Location: [facility];  Service: Orthopedics;  Laterality: Right;   POLYPECTOMY  02/28/2023   Procedure: POLYPECTOMY;  Surgeon: Onita Elspeth Sharper, DO;  Location: [facility] ENDOSCOPY;  Service: Gastroenterology;;   RIGHT/LEFT HEART CATH AND CORONARY ANGIOGRAPHY N/A 01/25/2021  Procedure: RIGHT/LEFT HEART CATH AND CORONARY ANGIOGRAPHY;  Surgeon: Mady Bruckner, MD;  Location: [facility];  Service: Cardiovascular;  Laterality: N/A;   TONSILLECTOMY     Family History  Problem Relation Age of Onset   Diabetes Mother    Hypertension Mother    Cancer Mother    Kidney disease Mother    Hypertension Father    Cancer Father        Prostate   Breast cancer Maternal Aunt  76   Colon cancer Son    Kidney cancer Neg Hx    Bladder Cancer Neg Hx    Social History   Occupational History   Occupation: Retired  Tobacco Use   Smoking status: Never    Passive exposure: Never   Smokeless tobacco: Never   Tobacco comments:    Never smoke 10/23/22  Vaping Use   Vaping status: Never Used  Substance and Sexual Activity   Alcohol  use: Not Currently    Alcohol /week: 1.0 standard drink of alcohol     Types: 1 Glasses of wine per week    Comment: RARELY   Drug use: No   Sexual activity: Yes    Partners: Male    Birth control/protection: Post-menopausal   Tobacco Counseling Counseling given: Not Answered Tobacco comments: Never smoke 10/23/22  SDOH Screenings   Food Insecurity: No Food Insecurity (11/26/2023)  Housing: Low Risk  (11/26/2023)  Transportation Needs: No Transportation Needs (11/26/2023)  Utilities: Not At Risk (11/26/2023)  Alcohol  Screen: Low Risk  (05/02/2023)  Depression (PHQ2-9): Low Risk  (03/16/2024)  Financial Resource Strain: Low Risk  (05/02/2023)  Physical Activity: Sufficiently Active (05/02/2023)  Social Connections: Socially Integrated (11/26/2023)  Stress: No Stress Concern Present (05/02/2023)  Tobacco Use: Low Risk  (04/20/2024)   Received from [facility]  Health Literacy: Adequate Health Literacy (05/02/2023)   Depression Screen    03/16/2024    9:00 AM 09/13/2023   10:35 AM 05/02/2023   10:34 AM 02/14/2023    9:03 AM 08/16/2022    8:33 AM 04/26/2022    1:59 PM 02/12/2022    9:11 AM  PHQ 2/9 Scores  PHQ - 2 Score 0 0 0 0 0 0 0  PHQ- 9 Score  0 0 0 0  0     Goals Addressed   None    Fall Screening Falls in the past year?: 0 Number of falls in past year: 0 Was there an injury with Fall?: 0 Fall Risk Category Calculator: 0 Patient Fall Risk Level: Moderate fall risk  Fall Risk Patient at Risk for Falls Due to: No Fall Risks Fall risk Follow up: Falls evaluation completed  Advance Directives (For  Healthcare) Does Patient Have a Medical Advance Directive?: Yes Type of Advance Directive: Healthcare Power of [place]; Living will Would patient like information on creating a medical advance directive?: No - Patient declined        Objective:    Today's Vitals   05/07/24 1024  Height: 5' 3 (1.6 m)   Body mass index is 24.27 kg/m.  Current Medications (verified) Outpatient Encounter Medications as of 05/07/2024  Medication Sig   acetaminophen  (TYLENOL ) 500 MG tablet Take 1,000 mg by mouth every 6 (six) hours as needed for moderate pain (pain score 4-6).   ALFALFA PO Take 1 tablet by mouth daily.   amiodarone  (PACERONE ) 200 MG tablet Take 1 tablet (200 mg total) by mouth daily.   ascorbic Acid  (VITAMIN C ) 500 MG CPCR Take 500 mg by  mouth daily.   B Complex-C (B-COMPLEX WITH VITAMIN C ) tablet Take 1 tablet by mouth daily. **SKAKLEE OSTEOMATRIX**   BD PEN NEEDLE NANO U/F 32G X 4 MM MISC USE 4 TIMES DAILY (HUMALOG  3 TIMES DAILY AND LANTUS  ONCE DAILY) OFFICE NOTIFIED 08/06/17   Calcium  Carbonate (CALCIUM  600 PO) Take 600 mg by mouth daily.   carvedilol  (COREG ) 6.25 MG tablet TAKE 1 TABLET BY MOUTH 2 TIMES DAILY WITH A MEAL.   cholecalciferol  (VITAMIN D ) 1000 UNITS tablet Take 1,000 Units by mouth 3 (three) times a week.   clobetasol  ointment (TEMOVATE ) 0.05 % Apply 1 Application topically as needed (FOR BUG BITES).   Coenzyme Q10 (COQ10) 200 MG CAPS Take 200 mg by mouth daily.   Continuous Glucose Sensor (DEXCOM G7 SENSOR) MISC 1 each by Does not apply route as directed.   Elastic Bandages & Supports (MEDICAL COMPRESSION STOCKINGS) MISC 1 each by Does not apply route daily.   ELIQUIS  5 MG TABS tablet TAKE 1 TABLET BY MOUTH TWICE A DAY   empagliflozin  (JARDIANCE ) 25 MG TABS tablet Take 25 mg by mouth daily.   ENTRESTO  49-51 MG TAKE 1 TABLET BY MOUTH TWICE A DAY   estradiol  (ESTRACE ) 0.1 MG/GM vaginal cream APPLY BLUEBERRY SIZED AMOUNT OF CREAM USING TIP OF FINGER TO URETHRA TWICE WEEKLY    fluconazole  (DIFLUCAN ) 150 MG tablet Take 1 tablet (150 mg total) by mouth every other day. Prn   HUMALOG  KWIKPEN 100 UNIT/ML KiwkPen Inject 2-6 Units into the skin 3 (three) times daily as needed (High blood sugar).   insulin  glargine (LANTUS ) 100 unit/mL SOPN Inject 13 Units into the skin in the morning.   JUBBONTI 60 MG/ML SOSY Inject 60 mg into the skin every 6 (six) months.   Krill Oil 1000 MG CAPS Take 1,000 mg by mouth daily.   Mag Aspart-Potassium Aspart (POTASSIUM & MAGNESIUM  ASPARTAT PO) Take 1 tablet by mouth daily.   Multiple Vitamins-Minerals (PRESERVISION AREDS 2) CHEW Chew 1 each by mouth 2 (two) times daily.   nystatin  cream (MYCOSTATIN ) APPLY TO AFFECTED AREA TWICE A DAY   OVER THE COUNTER MEDICATION Place 1 application  into both eyes See admin instructions. Blephadex eyelid foam, apply to eyelids once daily   OVER THE COUNTER MEDICATION Take 1 tablet by mouth daily. Vita-Lea Gold otc supplement With out vitamin K (Shaklee products)   OVER THE COUNTER MEDICATION Take 1 tablet by mouth daily. Nutriferon otc supplement   Probiotic Product (PROBIOTIC PO) Take 1 capsule by mouth at bedtime.   Propylene Glycol (SYSTANE BALANCE OP) Place 1 drop into both eyes daily as needed (dry eyes).   Pseudoeph-Bromphen-DM (BROMPHEN-PSEUDOEPH-DM) 2-30-10 MG/5ML SYRP Take 5 mLs by mouth 4 (four) times daily as needed (cough).   rosuvastatin  (CRESTOR ) 10 MG tablet TAKE 1 TABLET BY MOUTH EVERY DAY   Sodium Chloride -Sodium Bicarb (NETI POT SINUS WASH NA) Place 1 Dose into the nose at bedtime.   tirzepatide (MOUNJARO) 10 MG/0.5ML Pen Inject 10 mg into the skin every Sunday.   tiZANidine  (ZANAFLEX ) 2 MG tablet TAKE 1 TO 2 TABLETS (2-4 MG TOTAL) BY MOUTH AT BEDTIME.   torsemide  (DEMADEX ) 20 MG tablet TAKE 3 TABLETS BY MOUTH EVERY DAY   trimethoprim -polymyxin b  (POLYTRIM ) ophthalmic solution Place 1 drop into both eyes in the morning, at noon, in the evening, and at bedtime.   Vitamin D ,  Ergocalciferol , (DRISDOL ) 1.25 MG (50000 UNIT) CAPS capsule Take 50,000 Units by mouth every 14 (fourteen) days.   [DISCONTINUED] doxycycline  (VIBRA -TABS)  100 MG tablet Take 1 tablet (100 mg total) by mouth 2 (two) times daily.   No facility-administered encounter medications on file as of 05/07/2024.   Hearing/Vision screen No results found. Immunizations and Health Maintenance Health Maintenance  Topic Date Due   Influenza Vaccine  01/31/2024   COVID-19 Vaccine (10 - 2025-26 season) 03/02/2024   Medicare Annual Wellness (AWV)  05/01/2024   FOOT EXAM  07/31/2024   HEMOGLOBIN A1C  08/14/2024   Diabetic kidney evaluation - Urine ACR  09/12/2024   Mammogram  12/11/2024   OPHTHALMOLOGY EXAM  01/06/2025   Diabetic kidney evaluation - eGFR measurement  03/16/2025   DEXA SCAN  10/09/2025   DTaP/Tdap/Td (3 - Td or Tdap) 01/20/2032   Pneumococcal Vaccine: 50+ Years  Completed   Zoster Vaccines- Shingrix  Completed   Meningococcal B Vaccine  Aged Out   Hepatitis C Screening  Discontinued        Assessment/Plan:  This is a routine wellness examination for [place].  Patient Care Team: Sowles, Krichna, MD as PCP - General (Family Medicine) Perla Evalene PARAS, MD as PCP - Cardiology (Cardiology) Cindie Ole DASEN, MD as PCP - Electrophysiology (Cardiology) Perla Evalene PARAS, MD as Consulting Physician (Cardiology) Dasher, Alm LABOR, MD as Consulting Physician (Dermatology) Lenn Standing, MD as Consulting Physician (Ophthalmology) Cherilyn Debby CROME, MD as Consulting Physician (Internal Medicine) Herminio Miu, MD as Consulting Physician (Otolaryngology) Effie Rush, DC as Referring Physician (Chiropractic Medicine) Onita Standing Sharper, DO as Consulting Physician (Gastroenterology)  I have personally reviewed and noted the following in the patient's chart:   Medical and social history Use of alcohol , tobacco or illicit drugs  Current medications and supplements including  opioid prescriptions. Functional ability and status Nutritional status Physical activity Advanced directives List of other physicians Hospitalizations, surgeries, and ER visits in previous 12 months Vitals Screenings to include cognitive, depression, and falls Referrals and appointments  No orders of the defined types were placed in this encounter.  In addition, I have reviewed and discussed with patient certain preventive protocols, quality metrics, and best practice recommendations. A written personalized care plan for preventive services as well as general preventive health recommendations were provided to patient.   Jhonnie GORMAN Das, LPN   88/08/7972   No follow-ups on file.  After Visit Summary: (In Person-Printed) AVS printed and given to the patient  Nurse Notes: NONE

## 2024-05-10 NOTE — Progress Notes (Unsigned)
 Electrophysiology Clinic Note    Date:  05/10/2024  Patient ID:  Bonnie Rowland 1941-03-31, MRN [id number] PCP:  Glenard Mire, MD  Cardiologist:  Evalene Lunger, MD  Electrophysiologist:  OLE ONEIDA HOLTS, MD    ***refresh  Discussed the use of AI scribe software for clinical note transcription with the patient, who gave verbal consent to proceed.   Patient Profile    Chief Complaint: ***  History of Present Illness: Bonnie Rowland is a 83 y.o. female with PMH notable for persis AFib, Aflutter, HFimpEF, non-obs CAD, HTN, pulmHTN ; seen today for OLE ONEIDA HOLTS, MD for routine electrophysiology follow-up s/p Ablation.  She is s/p AFib, flutter ablation with PVI, posterior wall, and CTI 08/2022. Amiodarone  was stopped post-procedure d/t no Afib.  She was hospitalized 10/2023 for afib w RVR, converted to sinus with IV amiodarone . She had a stress test during hospiatlization that was not suggestive of ischemia.   She had redo AF ablation attempted on 10/7, but posterior wall, pulm veins, and CTA all had durable blocks.   On follow-up today, *** AF burden, symptoms *** palpitations *** bleeding concerns *** groins  - continue amio - needs thyroid  labs   Since last being seen in our clinic the patient reports doing ***.  she denies chest pain, palpitations, dyspnea, PND, orthopnea, nausea, vomiting, dizziness, syncope, edema, weight gain, or early satiety.       Arrhythmia/Device History No specialty comments available.    ROS:  Please see the history of present illness. All other systems are reviewed and otherwise negative.    Physical Exam    VS:  There were no vitals taken for this visit. BMI: There is no height or weight on file to calculate BMI.           Wt Readings from Last 3 Encounters:  05/07/24 137 lb 8 oz (62.4 kg)  04/07/24 137 lb (62.1 kg)  03/16/24 139 lb 6.4 oz (63.2 kg)     GEN- The patient is well appearing, alert and  oriented x 3 today.   Lungs- Clear to ausculation bilaterally, normal work of breathing.  Heart- {Blank single:19197::Regular,Irregularly irregular} rate and rhythm, no murmurs, rubs or gallops Extremities- {EDEMA LEVEL:28147::No} peripheral edema, warm, dry   Studies Reviewed   Previous EP, cardiology notes.    EKG is ordered. Personal review of EKG from today shows:  ***        Cardiac CT, 02/21/2024 1. There is normal pulmonary vein drainage into the left atrium. (2 on the right and 2 on the left) with ostial measurements as above.  2. The left atrial appendage is a chicken wing-Windsock type with ostial size 31 x 25 mm and length 36 mm, Area 60 mm2. There is no thrombus in the left atrial appendage. 3. The esophagus runs in the left atrial midline and is not in the proximity to any of the pulmonary veins. 4. Coronary calcium  score of 398. This is 74th percentile for age/gender.  NM Myocardial spect, 11/28/2023   The study is normal. The study is low risk.   No ST deviation was noted.   LV perfusion is normal. There is no evidence of ischemia. There is no evidence of infarction.   Left ventricular function is normal. End diastolic cavity size is normal. End systolic cavity size is normal.   Coronary calcium  was present on the attenuation correction CT images. Moderate coronary calcifications were present.  TTE, 11/26/2023  1. Left  ventricular ejection fraction, by estimation, is 65 to 70%. The left ventricle has normal function. The left ventricle has no regional wall motion abnormalities. Left ventricular diastolic parameters are consistent with Grade III diastolic dysfunction (restrictive). Elevated left atrial pressure.   2. Right ventricular systolic function is mildly reduced. The right  ventricular size is normal. There is mildly elevated pulmonary artery  systolic pressure.   3. Left atrial size was severely dilated.   4. The mitral valve is degenerative. Mild  mitral valve regurgitation. No evidence of mitral stenosis. Moderate mitral annular calcification.   5. The aortic valve is tricuspid. There is mild thickening of the aortic valve. Aortic valve regurgitation is not visualized. Aortic valve  sclerosis is present, with no evidence of aortic valve stenosis.   6. The inferior vena cava is normal in size with <50% respiratory  variability, suggesting right atrial pressure of 8 mmHg.   TTE, 04/19/2023  1. Left ventricular ejection fraction, by estimation, is 60 to 65%. The left ventricle has normal function. The left ventricle has no regional wall motion abnormalities. There is mild left ventricular hypertrophy. Left ventricular diastolic parameters are consistent with Grade II diastolic dysfunction (pseudonormalization).   2. Right ventricular systolic function is normal. The right ventricular size is normal.   3. Left atrial size was mildly dilated.   4. The mitral valve is degenerative. Mild mitral valve regurgitation.   5. The aortic valve is tricuspid. Aortic valve regurgitation is not visualized. Aortic valve sclerosis/calcification is present, without any evidence of aortic stenosis.   6. The inferior vena cava is normal in size with <50% respiratory variability, suggesting right atrial pressure of 8 mmHg.    Cardiac CTA, 09/18/2022 1. There is normal pulmonary vein drainage into the left atrium.  2. The left atrial appendage is a chicken wing type with ostial size 26 x 22 mm and length 33 mm, Area 44 mm2. There is no thrombus in the left atrial appendage  3. The esophagus runs in the left atrial midline and is not in the proximity to any of the pulmonary veins.  4. Coronary calcium  score of 171. This was 61st percentile for age and sex matched controls.    Assessment and Plan     #) persis Afib #) amiodarone  monitoring S/p AF, aflutter ablation 2024 S/p attempted redo afib/flutter ablation 04/2024 with durable isolation lines  present ***   #) Hypercoag d/t persis afib CHA2DS2-VASc Score = at least 7 [CHF History: 1, HTN History: 1, Diabetes History: 1, Stroke History: 0, Vascular Disease History: 1, Age Score: 2, Gender Score: 1].  Therefore, the patient's annual risk of stroke is 11.2 %.     {Confirm score is correct.  If not, click here to update score.  REFRESH note.  :1}   Stroke ppx - 5mg  eliquis  BID, appropriately dosed No bleeding concerns    #) ***   {Are you ordering a CV Procedure (e.g. stress test, cath, DCCV, TEE, etc)?   Press F2        :[id number]}   Current medicines are reviewed at length with the patient today.   The patient {ACTIONS; HAS/DOES NOT HAVE:19233} concerns regarding her medicines.  The following changes were made today:  {NONE DEFAULTED:18576}  Labs/ tests ordered today include: *** No orders of the defined types were placed in this encounter.    Disposition: Follow up with {EPMDS:28135::EP Team} or EP APP {EPFOLLOW UP:28173}   Signed, Jep Dyas, NP  05/10/24  7:15 PM  Electrophysiology [facility]

## 2024-05-11 ENCOUNTER — Ambulatory Visit: Attending: Cardiology | Admitting: Cardiology

## 2024-05-11 ENCOUNTER — Encounter: Payer: Self-pay | Admitting: Cardiology

## 2024-05-11 VITALS — BP 124/40 | HR 52 | Resp 18 | Ht 63.0 in | Wt 138.0 lb

## 2024-05-11 DIAGNOSIS — I4819 Other persistent atrial fibrillation: Secondary | ICD-10-CM | POA: Diagnosis not present

## 2024-05-11 DIAGNOSIS — Z5181 Encounter for therapeutic drug level monitoring: Secondary | ICD-10-CM | POA: Insufficient documentation

## 2024-05-11 DIAGNOSIS — Z79899 Other long term (current) drug therapy: Secondary | ICD-10-CM | POA: Insufficient documentation

## 2024-05-11 DIAGNOSIS — D6869 Other thrombophilia: Secondary | ICD-10-CM | POA: Diagnosis not present

## 2024-05-11 NOTE — Patient Instructions (Signed)
 Medication Instructions:   Your physician recommends the following medication changes.  STOP TAKING: Amiodarone    *If you need a refill on your cardiac medications before your next appointment, please call your pharmacy*  Lab Work:  None ordered at this time   If you have labs (blood work) drawn today and your tests are completely normal, you will receive your results only by:  MyChart Message (if you have MyChart) OR  A paper copy in the mail If you have any lab test that is abnormal or we need to change your treatment, we will call you to review the results.  Testing/Procedures:  None ordered at this time   Referrals:  None ordered at this time   Follow-Up:  At [facility], you and your health needs are our priority.  As part of our continuing mission to provide you with exceptional heart care, our providers are all part of one team.  This team includes your primary Cardiologist (physician) and Advanced Practice Providers or APPs (Physician Assistants and Nurse Practitioners) who all work together to provide you with the care you need, when you need it.  Your next appointment:   2 month(s)  Provider:    Suzann Riddle, NP    We recommend signing up for the patient portal called MyChart.  Sign up information is provided on this After Visit Summary.  MyChart is used to connect with patients for Virtual Visits (Telemedicine).  Patients are able to view lab/test results, encounter notes, upcoming appointments, etc.  Non-urgent messages can be sent to your provider as well.   To learn more about what you can do with MyChart, go to [email or web address].

## 2024-05-12 ENCOUNTER — Ambulatory Visit

## 2024-05-13 ENCOUNTER — Ambulatory Visit (INDEPENDENT_AMBULATORY_CARE_PROVIDER_SITE_OTHER)

## 2024-05-13 DIAGNOSIS — Z23 Encounter for immunization: Secondary | ICD-10-CM | POA: Diagnosis not present

## 2024-05-19 DIAGNOSIS — E785 Hyperlipidemia, unspecified: Secondary | ICD-10-CM | POA: Diagnosis not present

## 2024-05-19 DIAGNOSIS — E1129 Type 2 diabetes mellitus with other diabetic kidney complication: Secondary | ICD-10-CM | POA: Diagnosis not present

## 2024-05-19 DIAGNOSIS — N1832 Chronic kidney disease, stage 3b: Secondary | ICD-10-CM | POA: Diagnosis not present

## 2024-05-19 DIAGNOSIS — I959 Hypotension, unspecified: Secondary | ICD-10-CM | POA: Diagnosis not present

## 2024-05-19 DIAGNOSIS — E876 Hypokalemia: Secondary | ICD-10-CM | POA: Diagnosis not present

## 2024-05-19 DIAGNOSIS — I509 Heart failure, unspecified: Secondary | ICD-10-CM | POA: Diagnosis not present

## 2024-05-25 DIAGNOSIS — M5416 Radiculopathy, lumbar region: Secondary | ICD-10-CM | POA: Diagnosis not present

## 2024-05-25 DIAGNOSIS — M9901 Segmental and somatic dysfunction of cervical region: Secondary | ICD-10-CM | POA: Diagnosis not present

## 2024-05-25 DIAGNOSIS — M5033 Other cervical disc degeneration, cervicothoracic region: Secondary | ICD-10-CM | POA: Diagnosis not present

## 2024-05-25 DIAGNOSIS — M9903 Segmental and somatic dysfunction of lumbar region: Secondary | ICD-10-CM | POA: Diagnosis not present

## 2024-05-30 ENCOUNTER — Other Ambulatory Visit: Payer: Self-pay | Admitting: Family Medicine

## 2024-05-30 DIAGNOSIS — I701 Atherosclerosis of renal artery: Secondary | ICD-10-CM

## 2024-05-30 DIAGNOSIS — E1169 Type 2 diabetes mellitus with other specified complication: Secondary | ICD-10-CM

## 2024-06-03 NOTE — Telephone Encounter (Signed)
 Requested Prescriptions  Pending Prescriptions Disp Refills   rosuvastatin  (CRESTOR ) 10 MG tablet [Pharmacy Med Name: ROSUVASTATIN  CALCIUM  10 MG TAB] 90 tablet 0    Sig: TAKE 1 TABLET BY MOUTH EVERY DAY     Cardiovascular:  Antilipid - Statins 2 Failed - 06/03/2024 11:36 AM      Failed - Cr in normal range and within 360 days    Creat  Date Value Ref Range Status  03/16/2024 1.38 (H) 0.60 - 0.95 mg/dL Final   Creatinine, Urine  Date Value Ref Range Status  09/13/2023 83 20 - 275 mg/dL Final         Failed - Lipid Panel in normal range within the last 12 months    Cholesterol, Total  Date Value Ref Range Status  01/12/2016 110 100 - 199 mg/dL Final   Cholesterol  Date Value Ref Range Status  09/13/2023 101 <200 mg/dL Final   LDL Cholesterol (Calc)  Date Value Ref Range Status  09/13/2023 30 mg/dL (calc) Final    Comment:    Reference range: <100 . Desirable range <100 mg/dL for primary prevention;   <70 mg/dL for patients with CHD or diabetic patients  with > or = 2 CHD risk factors. SABRA LDL-C is now calculated using the Martin-Hopkins  calculation, which is a validated novel method providing  better accuracy than the Friedewald equation in the  estimation of LDL-C.  Gladis APPLETHWAITE et al. SANDREA. 7986;689(80): 2061-2068  (http://education.[employer].com/faq/FAQ164)    HDL  Date Value Ref Range Status  09/13/2023 54 > OR = 50 mg/dL Final  92/86/7982 59 >60 mg/dL Final   Triglycerides  Date Value Ref Range Status  09/13/2023 91 <150 mg/dL Final         Passed - Patient is not pregnant      Passed - Valid encounter within last 12 months    Recent Outpatient Visits           2 months ago Dyslipidemia associated with type 2 diabetes mellitus [facility])   [place] [facility] Glenard Mire, MD   8 months ago Atherosclerosis of abdominal aorta   [facility] Glenard Mire, MD       Future Appointments             In  1 month Riddle, Suzann, NP [facility] [place]   In 1 month McGowan, Clotilda DELENA RIGGERS [facility]

## 2024-06-05 ENCOUNTER — Telehealth: Payer: Self-pay | Admitting: Family

## 2024-06-05 NOTE — Telephone Encounter (Signed)
 Called to confirm/remind patient of their appointment at [facility] on 06/08/24.   Appointment:   [x] Confirmed  [] Left mess   [] No answer/No voice mail  [] VM Full/unable to leave message  [] Phone not in service  Patient reminded to bring all medications and/or complete list.  Confirmed patient has transportation. Gave directions, instructed to utilize valet parking.

## 2024-06-07 NOTE — Progress Notes (Unsigned)
 Advanced Heart Failure Clinic Note    PCP: Sowles, Krichna, MD  Primary Cardiologist: Perla Lye, MD / Abigail Motto, [place]  EP: Cindie Smalls, MD   Chief Complaint: fatigue   HPI:  Ms Massing is a 83 y/o female with a history of HTN, DM, hyperlipidemia, atrial fibrillation, GERD and chronic heart failure. DCCV on 11/30/2020 with recurrent Afib s/p repeat DCCV on amiodarone  01/20/2021, s/p repeat DCCV on 01/30/2021 and atrial flutter s/p ablation in 08/2022   Underwent LHC in 09/2005 which showed normal coronary arteries. Diagnosed with atrial flutter with RVR in 01/2015 after presenting for a total right knee replacement with spontaneous conversion to sinus rhythm. Echo in 09/2020 demonstrated an EF of 60 to 65%, no regional wall motion abnormalities, indeterminate LV diastolic function parameters, normal RV systolic function and ventricular cavity size, normal PASP, mildly dilated left atrium, and mild mitral regurgitation. Underwent DCCV on 11/30/2020 with conversion to sinus rhythm. 12/22/2020, she was noted to be back in Afib with controlled ventricular response. Loaded with amiodarone  and subsequently underwent repeat DCCV on 01/20/2021.   Admitted in 12/2020 with respiratory failure pulmonary edema in the setting of new LV dysfunction and recurrent A-fib.  Echo showed an EF of 45 to 50%, mild LVH, severe hypokinesis of the entire anterior and anteroseptal wall, moderately reduced RV systolic function with normal RV cavity size and mildly increased RV wall thickness, degenerative mitral valve with trivial regurgitation.  R/LHC showed mild to moderate, nonobstructive CAD including 30% mid LAD stenosis, 50 to 60% mid to distal LCx stenosis, and 60% proximal to mid RCA stenosis.  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  Moderately elevated right heart filling and  pulmonary artery pressures with significant pulmonary vascular resistance and mildly reduced cardiac output and index.  She was diuresed, placed on tolerated GDMT, and underwent successful repeat DCCV    Echo 03/31/21: EF of  60-65% without structural changes.   Recurrent A-fib in the fall 2023 with unsuccessful DCCV in the ED x 2. She was subsequently evaluated by EP and underwent successful A-fib/flutter ablation on 09/25/2022.   Cardiac CT 09/18/22: Small foci of subsegmental atelectasis at the anterior lung bases and in LEFT lower lobe.  Echo on 04/19/2023 showed an EF of 60 to 65%, no regional wall motion abnormalities, mild LVH, grade 2 diastolic dysfunction, normal RV systolic function and ventricular cavity size, mildly dilated left atrium, degenerative mitral valve with mild regurgitation, and aortic valve sclerosis without evidence of stenosis.  Saw cardiology 11/24 & EP 01/25.   Admitted 11/26/23 with chest pain. Found to be in AF RVR. 2 unsuccessful DCCV attempts in ED. Converted to NSR on amiodarone  drip. Troponin peaked at 145. Cardiology consulted. Myoview  scan negative for ischemia. Amiodarone  drip changed to oral. Echo 11/26/23: ejection fraction 65 to 70% with grade 3 diastolic dysfunction, mild pulmonary hypertension.   She presents today for a HF follow-up visit with a chief complaint of fatigue. Denies shortness of breath, chest pain, palpitations, abdominal distention, pedal edema, dizziness or change in sleeping pattern. Currently scheduled for ablation 09/25.   ROS: All systems negative except as listed in HPI, PMH and Problem List.  SH:  Social History   Socioeconomic History   Marital status: Married    Spouse name: Chalmer   Number of children: 1   Years of education: Not on file   Highest education  level: Master's degree (e.g., MA, MS, MEng, MEd, MSW, MBA)  Occupational History   Occupation: Retired  Tobacco Use   Smoking status: Never    Passive exposure: Never    Smokeless tobacco: Never   Tobacco comments:    Never smoke 10/23/22  Vaping Use   Vaping status: Never Used  Substance and Sexual Activity   Alcohol  use: Not Currently    Alcohol /week: 1.0 standard drink of alcohol     Types: 1 Glasses of wine per week    Comment: RARELY   Drug use: No   Sexual activity: Yes    Partners: Male    Birth control/protection: Post-menopausal  Other Topics Concern   Not on file  Social History Narrative   She is married , only has one son and he was diagnosed with colon cancer at age 66.    Social Drivers of Corporate Investment Banker Strain: Low Risk  (05/02/2023)   Overall Financial Resource Strain (CARDIA)    Difficulty of Paying Living Expenses: Not hard at all  Food Insecurity: No Food Insecurity (05/07/2024)   Hunger Vital Sign    Worried About Running Out of Food in the Last Year: Never true    Ran Out of Food in the Last Year: Never true  Transportation Needs: No Transportation Needs (05/07/2024)   PRAPARE - Administrator, Civil Service (Medical): No    Lack of Transportation (Non-Medical): No  Physical Activity: Sufficiently Active (05/07/2024)   Exercise Vital Sign    Days of Exercise per Week: 5 days    Minutes of Exercise per Session: 40 min  Stress: No Stress Concern Present (05/07/2024)   [employer] of Occupational Health - Occupational Stress Questionnaire    Feeling of Stress: Only a little  Social Connections: Socially Integrated (05/07/2024)   Social Connection and Isolation Panel    Frequency of Communication with Friends and Family: More than three times a week    Frequency of Social Gatherings with Friends and Family: Three times a week    Attends Religious Services: More than 4 times per year    Active Member of Clubs or Organizations: Yes    Attends Banker Meetings: More than 4 times per year    Marital Status: Married  Catering Manager Violence: Not At Risk (05/07/2024)   Humiliation,  Afraid, Rape, and Kick questionnaire    Fear of Current or Ex-Partner: No    Emotionally Abused: No    Physically Abused: No    Sexually Abused: No    FH:  Family History  Problem Relation Age of Onset   Diabetes Mother    Hypertension Mother    Cancer Mother    Kidney disease Mother    Hypertension Father    Cancer Father        Prostate   Breast cancer Maternal Aunt 40   Colon cancer Son    Kidney cancer Neg Hx    Bladder Cancer Neg Hx     Past Medical History:  Diagnosis Date   Acute cystitis    Acute on chronic combined systolic and diastolic CHF (congestive heart failure) (HCC)    Acute respiratory failure with hypoxia (HCC) 01/23/2021   AF (paroxysmal atrial fibrillation) (HCC) 01/23/2021   Allergic rhinitis, cause unspecified    Arthritis 2015   Atherosclerosis of renal artery    left   Atrial fibrillation (HCC)    Bronchitis, not specified as acute or chronic    Cancer (  HCC) 12/2013   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; left shoulder blade   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   Cataract 2003   Cellulitis and abscess of leg, except foot    Conjunctivitis unspecified    Dermatophytosis of nail    Diabetes mellitus    type II   Elevated troponin    Esophageal reflux    Hyperlipidemia    Hypertension    Osteoporosis 2016   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     ALFALFA PO Take 1 tablet by mouth daily.     ascorbic Acid  (VITAMIN C ) 500 MG CPCR Take 500 mg by mouth daily.     B Complex-C (B-COMPLEX WITH VITAMIN C ) tablet Take 1 tablet by mouth daily. **SKAKLEE OSTEOMATRIX**     BD PEN NEEDLE NANO U/F 32G X  4 MM MISC USE 4 TIMES DAILY (HUMALOG  3 TIMES DAILY AND LANTUS  ONCE DAILY) OFFICE NOTIFIED 08/06/17 90 each 1   Calcium  Carbonate (CALCIUM  600 PO) Take 600 mg by mouth daily.     carvedilol  (COREG ) 6.25 MG tablet TAKE 1 TABLET BY MOUTH 2 TIMES DAILY WITH A MEAL. 180 tablet 3   cholecalciferol  (VITAMIN D ) 1000 UNITS tablet Take 1,000 Units by mouth 3 (three) times a week.     clobetasol  ointment (TEMOVATE ) 0.05 % Apply 1 Application topically as needed (FOR BUG BITES).     Coenzyme Q10 (COQ10) 200 MG CAPS Take 200 mg by mouth daily.     Continuous Glucose Sensor (DEXCOM G7 SENSOR) MISC 1 each by Does not apply route as directed.     Elastic Bandages & Supports (MEDICAL COMPRESSION STOCKINGS) MISC 1 each by Does not apply route daily. 2 each 5   ELIQUIS  5 MG TABS tablet TAKE 1 TABLET BY MOUTH TWICE A DAY 180 tablet 1   empagliflozin  (JARDIANCE ) 25 MG TABS tablet Take 25 mg by mouth daily.     ENTRESTO  49-51 MG TAKE 1 TABLET BY MOUTH TWICE A DAY 180 tablet 3   estradiol  (ESTRACE ) 0.1 MG/GM vaginal cream APPLY BLUEBERRY SIZED AMOUNT OF CREAM USING TIP OF FINGER TO URETHRA TWICE WEEKLY 42.5 g 3   fluconazole  (DIFLUCAN ) 150 MG tablet Take 1 tablet (150 mg total) by mouth every other day. Prn 9 tablet 1   HUMALOG  KWIKPEN 100 UNIT/ML KiwkPen Inject 2-6 Units into the skin 3 (three) times daily as needed (High blood sugar).  3   insulin  glargine (LANTUS ) 100 unit/mL SOPN Inject 13 Units into the skin in the morning.     JUBBONTI 60 MG/ML SOSY Inject 60 mg into the skin every 6 (six) months.     Krill Oil 1000 MG CAPS Take 1,000 mg by mouth daily.     Mag Aspart-Potassium Aspart (POTASSIUM & MAGNESIUM  ASPARTAT PO) Take 1 tablet by mouth daily.     Multiple Vitamins-Minerals (PRESERVISION AREDS 2) CHEW Chew 1 each by mouth 2 (two) times daily.     nystatin  cream (MYCOSTATIN ) APPLY TO AFFECTED AREA TWICE A DAY 30 g 0   OVER THE COUNTER MEDICATION Place 1 application  into both  eyes See admin instructions.  Blephadex eyelid foam, apply to eyelids once daily     OVER THE COUNTER MEDICATION Take 1 tablet by mouth daily. Vita-Lea Gold otc supplement With out vitamin K (Shaklee products)     OVER THE COUNTER MEDICATION Take 1 tablet by mouth daily. Nutriferon otc supplement     Probiotic Product (PROBIOTIC PO) Take 1 capsule by mouth at bedtime.     Propylene Glycol (SYSTANE BALANCE OP) Place 1 drop into both eyes daily as needed (dry eyes).     Pseudoeph-Bromphen-DM (BROMPHEN-PSEUDOEPH-DM) 2-30-10 MG/5ML SYRP Take 5 mLs by mouth 4 (four) times daily as needed (cough).     rosuvastatin  (CRESTOR ) 10 MG tablet TAKE 1 TABLET BY MOUTH EVERY DAY 90 tablet 0   Sodium Chloride -Sodium Bicarb (NETI POT SINUS WASH NA) Place 1 Dose into the nose at bedtime.     tirzepatide (MOUNJARO) 10 MG/0.5ML Pen Inject 10 mg into the skin every Sunday.     tiZANidine  (ZANAFLEX ) 2 MG tablet TAKE 1 TO 2 TABLETS (2-4 MG TOTAL) BY MOUTH AT BEDTIME. (Patient taking differently: every 8 (eight) hours as needed. TAKES PRN) 180 tablet 1   torsemide  (DEMADEX ) 20 MG tablet TAKE 3 TABLETS BY MOUTH EVERY DAY 270 tablet 1   trimethoprim -polymyxin b  (POLYTRIM ) ophthalmic solution Place 1 drop into both eyes in the morning, at noon, in the evening, and at bedtime. (Patient not taking: Reported on 05/11/2024) 10 mL 0   Vitamin D , Ergocalciferol , (DRISDOL ) 1.25 MG (50000 UNIT) CAPS capsule Take 50,000 Units by mouth every 14 (fourteen) days.     No current facility-administered medications for this visit.   There were no vitals filed for this visit.  Wt Readings from Last 3 Encounters:  05/11/24 138 lb (62.6 kg)  05/07/24 137 lb 8 oz (62.4 kg)  04/07/24 137 lb (62.1 kg)   Lab Results  Component Value Date   CREATININE 1.38 (H) 03/16/2024   CREATININE 1.35 (H) 02/26/2024   CREATININE 1.24 (H) 02/17/2024    PHYSICAL EXAM:  General: Well appearing.  Cor: No JVD. Regular rhythm, bradycardic.  Lungs: clear Abdomen: soft, nontender,  nondistended. Extremities: trace pitting edema bilateral shins Neuro:. Affect pleasant   ECG: not done   ASSESSMENT & PLAN:  1: NICM with preserved ejection fraction- - suspect due to atrial fibrillation - NYHA class II - euvolemic today - weighing daily; reminded to call for an overnight weight gain of > 2 pounds or a weekly weight gain of > 5 pounds.  - weight stable from her last visit here 2 months ago - Echo 01/23/21: EF of 45-50% with mild LVH, severe hypokinesis of left ventricle and trivial MR.  - Echo 03/31/21: EF of  60-65% without structural changes.  - Echo 04/19/2023: EF of 60 to 65%, no regional wall motion abnormalities, mild LVH, G2DD, normal RV systolic function and ventricular cavity size, mildly dilated left atrium, degenerative mitral valve with mild regurgitation, and aortic valve sclerosis without evidence of stenosis. - Echo 11/26/23: ejection fraction 65 to 70% with grade 3 diastolic dysfunction, mild pulmonary hypertension.  - is trying to walk between 3000-5000 steps daily - not adding salt and has been reading food labels - continue carvedilol  6.25mg  BID - continue jardiance  25mg  daily (DM dose) - continue entresto  49/51 BID - continue torsemide  60mg  daily - has had low BP in the past so may not be able to tolerate MRA - wearing compression socks daily and removes HS - BNP 04/11/22 was  347.5  2: HTN- - BP 129/49 - saw PCP (Sowles) 03/25 - BMP 11/28/23 reviewed: sodium 138, potassium 4.4, creatinine 0.68  & GFR >60. Will be getting lab work updated for upcoming ablation  3: DM- - A1c 09/12/23 was 6.6% - saw endocrinology Leatrice) 03/25  4: Paroxysmal atrial fibrillation- - unsuccessfully cardioverted twice 10/23 - ablation done 03/24 - unsuccessful DCCV X 2 05/25 with conversion to NSR after amiodarone  drip - saw EP Marny) 06/25 - has upcoming ablation 09/25 - continue eliquis  5mg  BID - continue amiodarone  200mg  QD - TSH 11/26/23 was 3.181 -  will need routine eye exams  5: Nonobstructive CAD- - saw cardiology (Dunn) 11/24 - RHC/LHC done 01/25/21; Mild to moderate, non-obstructive coronary artery disease, including 30% mid LAD disease, 50-60% mid/distal LCx stenosis, and 60% proximal/mid RCA lesion. Mildly to moderately reduced left ventricular systolic function with mid anterior hypo/akinesis; query atypical Takotsubo variant.  LVEF ~45%. Mildly elevated left heart filling pressures. Moderately elevated right heart filling and pulmonary artery pressures with significant pulmonary vascular resistance. Mildly reduced Fick cardiac output/index. - LDL 09/13/23 was 30 - continue rosuvastatin  10mg  daily   Return in 4 months, sooner if needed.   Ellouise DELENA Class, FNP 06/07/24

## 2024-06-08 ENCOUNTER — Encounter: Payer: Self-pay | Admitting: Family

## 2024-06-08 ENCOUNTER — Ambulatory Visit: Attending: Family | Admitting: Family

## 2024-06-08 ENCOUNTER — Ambulatory Visit: Admitting: Podiatry

## 2024-06-08 ENCOUNTER — Encounter: Payer: Self-pay | Admitting: Podiatry

## 2024-06-08 VITALS — BP 115/43 | HR 50 | Wt 135.5 lb

## 2024-06-08 DIAGNOSIS — I110 Hypertensive heart disease with heart failure: Secondary | ICD-10-CM | POA: Diagnosis not present

## 2024-06-08 DIAGNOSIS — I2510 Atherosclerotic heart disease of native coronary artery without angina pectoris: Secondary | ICD-10-CM | POA: Diagnosis not present

## 2024-06-08 DIAGNOSIS — I5189 Other ill-defined heart diseases: Secondary | ICD-10-CM | POA: Diagnosis not present

## 2024-06-08 DIAGNOSIS — E785 Hyperlipidemia, unspecified: Secondary | ICD-10-CM

## 2024-06-08 DIAGNOSIS — R5383 Other fatigue: Secondary | ICD-10-CM | POA: Diagnosis not present

## 2024-06-08 DIAGNOSIS — Q828 Other specified congenital malformations of skin: Secondary | ICD-10-CM | POA: Diagnosis not present

## 2024-06-08 DIAGNOSIS — I5032 Chronic diastolic (congestive) heart failure: Secondary | ICD-10-CM

## 2024-06-08 DIAGNOSIS — Z794 Long term (current) use of insulin: Secondary | ICD-10-CM | POA: Diagnosis not present

## 2024-06-08 DIAGNOSIS — I10 Essential (primary) hypertension: Secondary | ICD-10-CM

## 2024-06-08 DIAGNOSIS — I272 Pulmonary hypertension, unspecified: Secondary | ICD-10-CM | POA: Diagnosis not present

## 2024-06-08 DIAGNOSIS — E1169 Type 2 diabetes mellitus with other specified complication: Secondary | ICD-10-CM

## 2024-06-08 DIAGNOSIS — E119 Type 2 diabetes mellitus without complications: Secondary | ICD-10-CM | POA: Diagnosis not present

## 2024-06-08 DIAGNOSIS — Z7901 Long term (current) use of anticoagulants: Secondary | ICD-10-CM | POA: Diagnosis not present

## 2024-06-08 DIAGNOSIS — N261 Atrophy of kidney (terminal): Secondary | ICD-10-CM | POA: Diagnosis not present

## 2024-06-08 DIAGNOSIS — Z7984 Long term (current) use of oral hypoglycemic drugs: Secondary | ICD-10-CM | POA: Diagnosis not present

## 2024-06-08 DIAGNOSIS — I428 Other cardiomyopathies: Secondary | ICD-10-CM | POA: Diagnosis not present

## 2024-06-08 DIAGNOSIS — Z7985 Long-term (current) use of injectable non-insulin antidiabetic drugs: Secondary | ICD-10-CM | POA: Diagnosis not present

## 2024-06-08 DIAGNOSIS — R251 Tremor, unspecified: Secondary | ICD-10-CM | POA: Diagnosis not present

## 2024-06-08 DIAGNOSIS — I482 Chronic atrial fibrillation, unspecified: Secondary | ICD-10-CM | POA: Diagnosis not present

## 2024-06-08 DIAGNOSIS — I480 Paroxysmal atrial fibrillation: Secondary | ICD-10-CM | POA: Diagnosis not present

## 2024-06-08 DIAGNOSIS — Z79899 Other long term (current) drug therapy: Secondary | ICD-10-CM | POA: Diagnosis not present

## 2024-06-08 MED ORDER — CARVEDILOL 3.125 MG PO TABS: 3.1250 mg | ORAL_TABLET | Freq: Two times a day (BID) | ORAL | Status: DC

## 2024-06-08 NOTE — Patient Instructions (Signed)
 DECREASE Coreg  to 3.125 MG tablet two times daily (Call us  when you are ready for a refill)  Follow Up: 6 months with Ellouise Class, NP

## 2024-06-11 ENCOUNTER — Encounter: Payer: Self-pay | Admitting: Podiatry

## 2024-06-11 DIAGNOSIS — C44311 Basal cell carcinoma of skin of nose: Secondary | ICD-10-CM | POA: Diagnosis not present

## 2024-06-11 DIAGNOSIS — L814 Other melanin hyperpigmentation: Secondary | ICD-10-CM | POA: Diagnosis not present

## 2024-06-11 DIAGNOSIS — L578 Other skin changes due to chronic exposure to nonionizing radiation: Secondary | ICD-10-CM | POA: Diagnosis not present

## 2024-06-11 NOTE — Progress Notes (Signed)
°  Subjective:  Patient ID: Bonnie Rowland, female    DOB: Aug 17, 1940,  MRN: [id number]  MELENDA Rowland presents to clinic today for preventative diabetic foot care and painful porokeratotic lesions right lower extremity. Pain prevent(s) comfortable ambulation. Aggravating factor is weightbearing with and without shoegear.  Chief Complaint  Patient presents with   RFC    Rm1 Diabetic foot care/ Dr. Dorette Loron last visit September 15,2025, A1c 6.6   New problem(s): None.   PCP is Loron Dorette, MD.  Allergies[1]  Review of Systems: Negative except as noted in the HPI.  Objective: No changes noted in today's physical examination. There were no vitals filed for this visit. SINAHI Rowland is a pleasant 83 y.o. female WD, WN in NAD. AAO x 3.  Vascular Examination: Capillary refill time immediate b/l. Palpable pedal pulses. Pedal hair present b/l. No pain with calf compression b/l. Skin temperature gradient WNL b/l. No cyanosis or clubbing b/l. No ischemia or gangrene noted b/l.   Neurological Examination: Sensation grossly intact b/l with 10 gram monofilament. Vibratory sensation intact b/l.   Dermatological Examination: Pedal skin with normal turgor, texture and tone b/l.  No open wounds. No interdigital macerations.   Toenails 1-5 b/l well maintained with adequate length. No erythema, no edema, no drainage, no fluctuance. Porokeratotic lesion(s) plantarlateral midfoot right foot, plantar heel pad of right foot, and 5th met base right foot. No erythema, no edema, no drainage, no fluctuance.  Musculoskeletal Examination: Muscle strength 5/5 to all lower extremity muscle groups bilaterally. Muscle strength 5/5 to all lower extremity muscle groups bilaterally. Plantarflexed metatarsal(s) 5th metatarsal head b/l lower extremities.. No pain, crepitus or joint limitation noted with ROM b/l LE.  Patient ambulates independently without assistive aids.  Radiographs:  None  Assessment/Plan: 1. Porokeratosis   2. Diabetes mellitus without complication (HCC)   -Consent given for treatment as described below: -Examined patient. -Continue foot and shoe inspections daily. Monitor blood glucose per PCP/Endocrinologist's recommendations. -Patient to continue soft, supportive shoe gear daily. -Porokeratotic lesion(s) right heel, 5th met base right foot, and plantarlateral aspect of midfoot right foot pared and enucleated with sterile currette without incident. Total number of lesions debrided=3. -Patient/POA to call should there be question/concern in the interim.   Return in about 9 weeks (around 08/10/2024).  Delon LITTIE Merlin, DPM      [place] LOCATION: [address] [place] [address]                   Office [phone]   [place] LOCATION: [address] [place], [place] [address] Office [phone]     [1]  Allergies Allergen Reactions   Flexeril  [Cyclobenzaprine ] Hypertension   Cheese Other (See Comments)    bloating   Ciprofloxacin Hcl Other (See Comments)    Muscle pain   Coconut (Cocos Nucifera) Other (See Comments)    Upset stomach   Keflex [Cephalexin] Other (See Comments)    Patient prefers not to take this medication due to the side effects, caused tendon issues

## 2024-06-14 ENCOUNTER — Other Ambulatory Visit: Payer: Self-pay | Admitting: Student

## 2024-06-16 ENCOUNTER — Telehealth: Payer: Self-pay | Admitting: Family

## 2024-06-16 MED ORDER — CARVEDILOL 3.125 MG PO TABS: 3.1250 mg | ORAL_TABLET | Freq: Two times a day (BID) | ORAL | 1 refills | Status: AC

## 2024-06-16 NOTE — Telephone Encounter (Signed)
Refill sent per pt request.  

## 2024-07-06 NOTE — Progress Notes (Signed)
 "     Electrophysiology Clinic Note    Date:  07/07/2024  Patient ID:  Rikki, Trosper [birth year], MRN [id number] PCP:  Glenard Mire, MD  Cardiologist:  Evalene Lunger, MD  Electrophysiologist:  Fonda Kitty, MD  Electrophysiology APP:  Ermelinda Eckert, NP    Discussed the use of AI scribe software for clinical note transcription with the patient, who gave verbal consent to proceed.   Patient Profile    Chief Complaint: AF follow-up  History of Present Illness: CIARAH PEACE is a 84 y.o. female with PMH notable for persis AFib, Aflutter, HFimpEF, non-obs CAD, HTN, pulmHTN, T2DM ; seen today for Fonda Kitty, MD (Previously Dr. Cindie) for routine electrophysiology follow-up.  She is s/p AFib, flutter ablation with PVI, posterior wall, and CTI 08/2022. Amiodarone  was stopped post-procedure d/t no Afib.  She was hospitalized 10/2023 for afib w RVR, converted to sinus with IV amiodarone . She had a stress test during hospitalization that was not suggestive of ischemia.   She had redo AF ablation attempted on 04/2024, but posterior wall, pulm veins, and CTI all had durable blocks.  I saw her 05/2024 where she was maintaining sinus rhythm, requested to stop amiodarone  d/t hair loss and it was stopped.   On follow-up today, she has not had any atrial fibrillation episodes since her last visit.  She historically is quite symptomatic during her A-fib episodes.  She also uses Kardia mobile sporadically to monitor.  She continues to take Eliquis  twice a day without bleeding concerns. She denies chest pain, chest pressure, palpitations, increased shortness of breath or presyncope.     Arrhythmia/Device History Amiodarone  - stopped 04/2024 d/t hair loss    ROS:  Please see the history of present illness. All other systems are reviewed and otherwise negative.    Physical Exam    VS:  BP (!) 120/44 (BP Location: Left Arm, Patient Position: Sitting)   Pulse 62   Ht 5' 3  (1.6 m)   Wt 138 lb 6.4 oz (62.8 kg)   SpO2 97%   BMI 24.52 kg/m  BMI: Body mass index is 24.52 kg/m.           Wt Readings from Last 3 Encounters:  07/07/24 138 lb 6.4 oz (62.8 kg)  06/08/24 135 lb 8 oz (61.5 kg)  05/11/24 138 lb (62.6 kg)      GEN- The patient is well appearing, alert and oriented x 3 today.   Lungs- Clear to ausculation bilaterally, normal work of breathing.  Heart- Regular rate and rhythm, no murmurs, rubs or gallops Extremities- No peripheral edema, warm, dry   Studies Reviewed   Previous EP, cardiology notes.    EKG is ordered. Personal review of EKG from today shows:    EKG Interpretation Date/Time:  Tuesday July 07 2024 13:36:51 EST Ventricular Rate:  62 PR Interval:  170 QRS Duration:  74 QT Interval:  456 QTC Calculation: 462 R Axis:   -6  Text Interpretation: Normal sinus rhythm Low voltage QRS Confirmed by Chantea Surace [phone]) on 07/07/2024 1:40:59 PM    Cardiac CT, 02/21/2024 1. There is normal pulmonary vein drainage into the left atrium. (2 on the right and 2 on the left) with ostial measurements as above.  2. The left atrial appendage is a chicken wing-Windsock type with ostial size 31 x 25 mm and length 36 mm, Area 60 mm2. There is no thrombus in the left atrial appendage. 3. The esophagus runs in the left  atrial midline and is not in the proximity to any of the pulmonary veins. 4. Coronary calcium  score of 398. This is 74th percentile for age/gender.  NM Myocardial spect, 11/28/2023   The study is normal. The study is low risk.   No ST deviation was noted.   LV perfusion is normal. There is no evidence of ischemia. There is no evidence of infarction.   Left ventricular function is normal. End diastolic cavity size is normal. End systolic cavity size is normal.   Coronary calcium  was present on the attenuation correction CT images. Moderate coronary calcifications were present.  TTE, 11/26/2023  1. Left ventricular ejection  fraction, by estimation, is 65 to 70%. The left ventricle has normal function. The left ventricle has no regional wall motion abnormalities. Left ventricular diastolic parameters are consistent with Grade III diastolic dysfunction (restrictive). Elevated left atrial pressure.   2. Right ventricular systolic function is mildly reduced. The right ventricular size is normal. There is mildly elevated pulmonary artery systolic pressure.   3. Left atrial size was severely dilated.   4. The mitral valve is degenerative. Mild mitral valve regurgitation. No evidence of mitral stenosis. Moderate mitral annular calcification.   5. The aortic valve is tricuspid. There is mild thickening of the aortic valve. Aortic valve regurgitation is not visualized. Aortic valve sclerosis is present, with no evidence of aortic valve stenosis.   6. The inferior vena cava is normal in size with <50% respiratory variability, suggesting right atrial pressure of 8 mmHg.   TTE, 04/19/2023  1. Left ventricular ejection fraction, by estimation, is 60 to 65%. The left ventricle has normal function. The left ventricle has no regional wall motion abnormalities. There is mild left ventricular hypertrophy. Left ventricular diastolic parameters are consistent with Grade II diastolic dysfunction (pseudonormalization).   2. Right ventricular systolic function is normal. The right ventricular size is normal.   3. Left atrial size was mildly dilated.   4. The mitral valve is degenerative. Mild mitral valve regurgitation.   5. The aortic valve is tricuspid. Aortic valve regurgitation is not visualized. Aortic valve sclerosis/calcification is present, without any evidence of aortic stenosis.   6. The inferior vena cava is normal in size with <50% respiratory variability, suggesting right atrial pressure of 8 mmHg.    Cardiac CTA, 09/18/2022 1. There is normal pulmonary vein drainage into the left atrium.  2. The left atrial appendage is a  chicken wing type with ostial size 26 x 22 mm and length 33 mm, Area 44 mm2. There is no thrombus in the left atrial appendage  3. The esophagus runs in the left atrial midline and is not in the proximity to any of the pulmonary veins.  4. Coronary calcium  score of 171. This was 61st percentile for age and sex matched controls.   Assessment and Plan     #) persis Afib #) aflutter S/p AF, aflutter ablation 2024 S/p attempted redo afib/flutter ablation 04/2024 with durable isolation lines present Maintaining sinus rhythm off amiodarone , stopped 05/2024 Recommended she continue to check heart rhythm via Kardia mobile If has A-fib recurrence, consider Tikosyn versus Multaq  #) Hypercoag d/t persis afib CHA2DS2-VASc Score = at least 7 [CHF History: 1, HTN History: 1, Diabetes History: 1, Stroke History: 0, Vascular Disease History: 1, Age Score: 2, Gender Score: 1].  Therefore, the patient's annual risk of stroke is 11.2 %.    Stroke ppx - 5mg  eliquis  BID, appropriately dosed No bleeding concerns  Current medicines are reviewed at length with the patient today.   The patient has concerns regarding her medicines.  The following changes were made today:   none  Labs/ tests ordered today include:  Orders Placed This Encounter  Procedures   EKG 12-Lead     Disposition: Follow up with Dr. Kennyth or EP APP in 6 months, sooner if needed   Signed, Luie Laneve, NP  07/07/2024  3:54 PM  Electrophysiology [facility] "

## 2024-07-07 ENCOUNTER — Encounter: Payer: Self-pay | Admitting: Cardiology

## 2024-07-07 ENCOUNTER — Ambulatory Visit: Attending: Cardiology | Admitting: Cardiology

## 2024-07-07 VITALS — BP 120/44 | HR 62 | Ht 63.0 in | Wt 138.4 lb

## 2024-07-07 DIAGNOSIS — I4819 Other persistent atrial fibrillation: Secondary | ICD-10-CM | POA: Diagnosis not present

## 2024-07-07 DIAGNOSIS — I4892 Unspecified atrial flutter: Secondary | ICD-10-CM | POA: Insufficient documentation

## 2024-07-07 DIAGNOSIS — D6869 Other thrombophilia: Secondary | ICD-10-CM | POA: Insufficient documentation

## 2024-07-07 NOTE — Patient Instructions (Signed)
 Medication Instructions:  Your physician recommends that you continue on your current medications as directed. Please refer to the Current Medication list given to you today.  *If you need a refill on your cardiac medications before your next appointment, please call your pharmacy*  Lab Work: No labs ordered today    Testing/Procedures: No test ordered today   Follow-Up: At [facility], you and your health needs are our priority.  As part of our continuing mission to provide you with exceptional heart care, our providers are all part of one team.  This team includes your primary Cardiologist (physician) and Advanced Practice Providers or APPs (Physician Assistants and Nurse Practitioners) who all work together to provide you with the care you need, when you need it.  Your next appointment:   1 year(s)  Provider:   Suzann Riddle, NP    Reminder to use your Kardia and let us  know if there is anything unusual.

## 2024-07-10 ENCOUNTER — Ambulatory Visit: Payer: Self-pay | Admitting: Urology

## 2024-07-12 NOTE — Progress Notes (Unsigned)
 "   07/13/2024 6:42 PM   Bonnie Rowland Feb 04, 1941 [id number]  Referring provider: Glenard Mire, MD [address] [place],  [place] [address]  Urological history: 1. Urge incontinence -cysto (2019) - NED -contributing factors of age, obesity, caffeine, diabetes, vaginal atrophy (using vaginal estrogen cream), ACE inhibitors, alpha blockers, antiarrhythmics and diuretics -Managed with poise pads and vaginal estrogen cream   2. Renal stone -contrast CT 2021 - 3 mm left upper renal stone - RUS (04/2024) - ? 9 mm left renal stone  3. Renal cyst -contrast CT 2021 - 12 mm left renal inferior pole cyst -RUS (04/2024) - left 1.9 cm cystic lesion  HPI: Bonnie Rowland is a 84 y.o. female who presents today for yearly follow up.   Previous records reviewed.    They are having (1 to 7) or (8 or more) daytime voids,  they are having nocturia (1-2) or (3 or more) and urgency is (none, mild, strong, severe).   They are having (stress, urge or mixed incontinence.)    they are having urinary leakage (1-2 times weekly, 3 or more times weekly, 1-2 times daily and 3 or more times daily) They are using absorbent products for leakage (no, sometimes, always )   the type of products they use are (panty liners, absorbant pads, depends) *** daily.  They are not limiting fluids.  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; left  shoulder blade   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   Cataract 2003   Cellulitis and abscess of leg, except foot    Conjunctivitis unspecified    Dermatophytosis of nail    Diabetes mellitus    type II   Elevated troponin    Esophageal reflux    Hyperlipidemia    Hypertension    Osteoporosis 2016   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)    Renal artery stenosis    Sprain of lumbar region    Thoracic or lumbosacral neuritis or radiculitis, unspecified    Urinary tract infection, site not specified     Surgical History: Past Surgical History:  Procedure Laterality Date   APPENDECTOMY     ATRIAL FIBRILLATION ABLATION N/A 09/25/2022   Procedure: ATRIAL FIBRILLATION ABLATION;  Surgeon: Cindie Ole DASEN, MD;  Location: [facility];  Service: Cardiovascular;  Laterality: N/A;   ATRIAL FIBRILLATION ABLATION N/A 04/07/2024   Procedure: ATRIAL FIBRILLATION ABLATION;  Surgeon: Cindie Ole DASEN, MD;  Location: [facility] CV [facility];  Service: Cardiovascular;  Laterality: N/A;   BIOPSY  02/28/2023   Procedure: BIOPSY;  Surgeon: Onita Elspeth Sharper, DO;  Location: [facility] ENDOSCOPY;  Service: Gastroenterology;;   CARDIAC CATHETERIZATION     CARDIOVERSION N/A 11/30/2020   Procedure: CARDIOVERSION;  Surgeon: Perla Evalene PARAS, MD;  Location: [facility] ORS;  Service: Cardiovascular;  Laterality: N/A;   CARDIOVERSION N/A 01/20/2021  Procedure: CARDIOVERSION;  Surgeon: Perla Evalene PARAS, MD;  Location: [facility] ORS;  Service: Cardiovascular;  Laterality: N/A;   CARDIOVERSION N/A 01/30/2021   Procedure: CARDIOVERSION;  Surgeon: Darron Deatrice LABOR, MD;  Location: [facility] ORS;  Service: Cardiovascular;  Laterality: N/A;   cataract surgery     COLONOSCOPY     COLONOSCOPY N/A 02/28/2023   Procedure: COLONOSCOPY;  Surgeon: Onita Elspeth Sharper, DO;  Location: [facility] ENDOSCOPY;  Service: Gastroenterology;  Laterality: N/A;   COLONOSCOPY WITH PROPOFOL  N/A 05/09/2016   Procedure: COLONOSCOPY WITH PROPOFOL ;   Surgeon: Reyes LELON Cota, MD;  Location: [facility] ENDOSCOPY;  Service: Endoscopy;  Laterality: N/A;   DIAGNOSTIC MAMMOGRAM     EYE SURGERY Bilateral    Cataract Extraction   JOINT REPLACEMENT  2016   KNEE ARTHROPLASTY Right 03/30/2015   Procedure: COMPUTER ASSISTED TOTAL KNEE ARTHROPLASTY;  Surgeon: Lynwood SHAUNNA Hue, MD;  Location: [facility] ORS;  Service: Orthopedics;  Laterality: Right;   KNEE ARTHROSCOPY Right    KNEE CLOSED REDUCTION Right 05/23/2015   Procedure: CLOSED MANIPULATION KNEE;  Surgeon: Lynwood SHAUNNA Hue, MD;  Location: [facility] ORS;  Service: Orthopedics;  Laterality: Right;   POLYPECTOMY  02/28/2023   Procedure: POLYPECTOMY;  Surgeon: Onita Elspeth Sharper, DO;  Location: [facility] ENDOSCOPY;  Service: Gastroenterology;;   RIGHT/LEFT HEART CATH AND CORONARY ANGIOGRAPHY N/A 01/25/2021   Procedure: RIGHT/LEFT HEART CATH AND CORONARY ANGIOGRAPHY;  Surgeon: Mady Bruckner, MD;  Location: [facility] INVASIVE CV LAB;  Service: Cardiovascular;  Laterality: N/A;   TONSILLECTOMY      Home Medications:  Allergies as of 07/13/2024       Reactions   Flexeril  [cyclobenzaprine ] Hypertension   Cheese Other (See Comments)   bloating   Ciprofloxacin Hcl Other (See Comments)   Muscle pain   Coconut (cocos Nucifera) Other (See Comments)   Upset stomach   Keflex [cephalexin] Other (See Comments)   Patient prefers not to take this medication due to the side effects, caused tendon issues        Medication List        Accurate as of July 12, 2024  6:42 PM. If you have any questions, ask your nurse or doctor.          acetaminophen  500 MG tablet Commonly known as: TYLENOL  Take 1,000 mg by mouth every 6 (six) hours as needed for moderate pain (pain score 4-6).   ALFALFA PO Take 1 tablet by mouth daily.   ascorbic Acid  500 MG Cpcr Commonly known as: VITAMIN C  Take 500 mg by mouth daily.   B-complex with vitamin C  tablet Take 1 tablet by mouth daily. **SKAKLEE OSTEOMATRIX**   BD Pen Needle  Nano U/F 32G X 4 MM Misc Generic drug: Insulin  Pen Needle USE 4 TIMES DAILY (HUMALOG  3 TIMES DAILY AND LANTUS  ONCE DAILY) OFFICE NOTIFIED 08/06/17   Bromphen-Pseudoeph-DM 2-30-10 MG/5ML Syrp Take 5 mLs by mouth 4 (four) times daily as needed (cough).   CALCIUM  600 PO Take 600 mg by mouth daily.   carvedilol  3.125 MG tablet Commonly known as: COREG  Take 1 tablet (3.125 mg total) by mouth 2 (two) times daily.   carvedilol  6.25 MG tablet Commonly known as: COREG  TAKE 1 TABLET BY MOUTH TWICE A DAY WITH FOOD   cholecalciferol  1000 units tablet Commonly known as: VITAMIN D  Take 1,000 Units by mouth 3 (three) times a week.   clobetasol  ointment 0.05 % Commonly known as: TEMOVATE  Apply 1 Application topically as needed (FOR BUG BITES).   CoQ10 200 MG Caps Take  200 mg by mouth daily.   Dexcom G7 Sensor Misc 1 each by Does not apply route as directed.   Eliquis  5 MG Tabs tablet Generic drug: apixaban  TAKE 1 TABLET BY MOUTH TWICE A DAY   empagliflozin  25 MG Tabs tablet Commonly known as: JARDIANCE  Take 25 mg by mouth daily.   Entresto  49-51 MG Generic drug: sacubitril -valsartan  TAKE 1 TABLET BY MOUTH TWICE A DAY   estradiol  0.1 MG/GM vaginal cream Commonly known as: ESTRACE  APPLY BLUEBERRY SIZED AMOUNT OF CREAM USING TIP OF FINGER TO URETHRA TWICE WEEKLY   fluconazole  150 MG tablet Commonly known as: DIFLUCAN  Take 1 tablet (150 mg total) by mouth every other day. Prn   HumaLOG  KwikPen 100 UNIT/ML KwikPen Generic drug: insulin  lispro Inject 2-6 Units into the skin 3 (three) times daily as needed (High blood sugar).   insulin  glargine 100 unit/mL Sopn Commonly known as: LANTUS  Inject 13 Units into the skin in the morning.   Jubbonti 60 MG/ML Sosy injection Generic drug: denosumab -bbdz Inject 60 mg into the skin every 6 (six) months.   Krill Oil 1000 MG Caps Take 1,000 mg by mouth daily.   Medical Compression Stockings Misc 1 each by Does not apply route daily.    Mounjaro 10 MG/0.5ML Pen Generic drug: tirzepatide Inject 10 mg into the skin every Sunday.   NETI POT SINUS WASH NA Place 1 Dose into the nose at bedtime.   nystatin  cream Commonly known as: MYCOSTATIN  APPLY TO AFFECTED AREA TWICE A DAY   OVER THE COUNTER MEDICATION Place 1 application  into both eyes See admin instructions. Blephadex eyelid foam, apply to eyelids once daily   OVER THE COUNTER MEDICATION Take 1 tablet by mouth daily. Vita-Lea Gold otc supplement With out vitamin K (Shaklee products)   OVER THE COUNTER MEDICATION Take 1 tablet by mouth daily. Nutriferon otc supplement   POTASSIUM & MAGNESIUM  ASPARTAT PO Take 1 tablet by mouth daily.   PreserVision AREDS 2 Chew Chew 1 each by mouth 2 (two) times daily.   PROBIOTIC PO Take 1 capsule by mouth at bedtime.   rosuvastatin  10 MG tablet Commonly known as: CRESTOR  TAKE 1 TABLET BY MOUTH EVERY DAY   SYSTANE BALANCE OP Place 1 drop into both eyes daily as needed (dry eyes).   tiZANidine  2 MG tablet Commonly known as: ZANAFLEX  TAKE 1 TO 2 TABLETS (2-4 MG TOTAL) BY MOUTH AT BEDTIME.   torsemide  20 MG tablet Commonly known as: DEMADEX  TAKE 3 TABLETS BY MOUTH EVERY DAY   trimethoprim -polymyxin b  ophthalmic solution Commonly known as: Polytrim  Place 1 drop into both eyes in the morning, at noon, in the evening, and at bedtime.   Vitamin D  (Ergocalciferol ) 1.25 MG (50000 UNIT) Caps capsule Commonly known as: DRISDOL  Take 50,000 Units by mouth every 14 (fourteen) days.        Allergies:  Allergies  Allergen Reactions   Flexeril  [Cyclobenzaprine ] Hypertension   Cheese Other (See Comments)    bloating   Ciprofloxacin Hcl Other (See Comments)    Muscle pain   Coconut (Cocos Nucifera) Other (See Comments)    Upset stomach   Keflex [Cephalexin] Other (See Comments)    Patient prefers not to take this medication due to the side effects, caused tendon issues    Family History: Family History  Problem  Relation Age of Onset   Diabetes Mother    Hypertension Mother    Cancer Mother    Kidney disease Mother    Hypertension Father  Cancer Father        Prostate   Breast cancer Maternal Aunt 8   Colon cancer Son    Kidney cancer Neg Hx    Bladder Cancer Neg Hx     Social History:  reports that she has never smoked. She has never been exposed to tobacco smoke. She has never used smokeless tobacco. She reports that she does not currently use alcohol  after a past usage of about 1.0 standard drink of alcohol  per week. She reports that she does not use drugs.  ROS: For pertinent review of systems please refer to history of present illness  Physical Exam: There were no vitals taken for this visit.  Constitutional:  Well nourished. Alert and oriented, No acute distress. HEENT: [place] AT, moist mucus membranes.  Trachea midline, no masses. Cardiovascular: No clubbing, cyanosis, or edema. Respiratory: Normal respiratory effort, no increased work of breathing. GU: No CVA tenderness.  No bladder fullness or masses.  Recession of labia minora, dry, pale vulvar vaginal mucosa and loss of mucosal ridges and folds.  Normal urethral meatus, no lesions, no prolapse, no discharge.   No urethral masses, tenderness and/or tenderness. No bladder fullness, tenderness or masses. *** vagina mucosa, *** estrogen effect, no discharge, no lesions, *** pelvic support, *** cystocele and *** rectocele noted.  No cervical motion tenderness.  Uterus is freely mobile and non-fixed.  No adnexal/parametria masses or tenderness noted.  Anus and perineum are without rashes or lesions.   ***  Neurologic: Grossly intact, no focal deficits, moving all 4 extremities. Psychiatric: Normal mood and affect.    Laboratory Data: See Epic and HPI   I have reviewed the labs.   Pertinent Imaging: N/A  Assessment and plan:  1.  Urge incontinence -***  2. Vaginal Atrophy -continue to apply the vaginal estrogen cream 3 nights  weekly ***   No follow-ups on file.  These notes generated with voice recognition software. I apologize for typographical errors.  Bonnie Rowland [facility] [address] [place], [place] [address] [phone]  "

## 2024-07-13 ENCOUNTER — Ambulatory Visit: Admitting: Urology

## 2024-07-13 ENCOUNTER — Encounter: Payer: Self-pay | Admitting: Urology

## 2024-07-13 VITALS — BP 116/73 | Wt 135.0 lb

## 2024-07-13 DIAGNOSIS — N3941 Urge incontinence: Secondary | ICD-10-CM

## 2024-07-13 DIAGNOSIS — N952 Postmenopausal atrophic vaginitis: Secondary | ICD-10-CM | POA: Diagnosis not present

## 2024-07-14 LAB — OPHTHALMOLOGY REPORT-SCANNED

## 2024-08-31 ENCOUNTER — Ambulatory Visit: Admitting: Podiatry

## 2024-09-14 ENCOUNTER — Ambulatory Visit: Admitting: Family Medicine

## 2024-11-02 ENCOUNTER — Ambulatory Visit: Admitting: Podiatry

## 2024-12-07 ENCOUNTER — Ambulatory Visit: Admitting: Family

## 2025-05-13 ENCOUNTER — Ambulatory Visit

## 2025-07-13 ENCOUNTER — Ambulatory Visit: Admitting: Urology
